# Patient Record
Sex: Female | Born: 1955
Health system: Southern US, Community
[De-identification: ages and names within clinical notes are randomized; demographics above are authoritative.]

## PROBLEM LIST (undated history)

## (undated) DIAGNOSIS — D329 Benign neoplasm of meninges, unspecified: Secondary | ICD-10-CM

## (undated) DIAGNOSIS — K219 Gastro-esophageal reflux disease without esophagitis: Secondary | ICD-10-CM

## (undated) DIAGNOSIS — R0600 Dyspnea, unspecified: Secondary | ICD-10-CM

## (undated) DIAGNOSIS — H269 Unspecified cataract: Secondary | ICD-10-CM

## (undated) DIAGNOSIS — I499 Cardiac arrhythmia, unspecified: Secondary | ICD-10-CM

## (undated) DIAGNOSIS — Z87442 Personal history of urinary calculi: Secondary | ICD-10-CM

## (undated) DIAGNOSIS — T7840XA Allergy, unspecified, initial encounter: Secondary | ICD-10-CM

## (undated) DIAGNOSIS — K449 Diaphragmatic hernia without obstruction or gangrene: Secondary | ICD-10-CM

## (undated) DIAGNOSIS — E236 Other disorders of pituitary gland: Secondary | ICD-10-CM

## (undated) DIAGNOSIS — M2550 Pain in unspecified joint: Secondary | ICD-10-CM

## (undated) DIAGNOSIS — I10 Essential (primary) hypertension: Secondary | ICD-10-CM

## (undated) DIAGNOSIS — M1990 Unspecified osteoarthritis, unspecified site: Secondary | ICD-10-CM

## (undated) DIAGNOSIS — C801 Malignant (primary) neoplasm, unspecified: Secondary | ICD-10-CM

## (undated) DIAGNOSIS — Z1401 Asymptomatic hemophilia A carrier: Secondary | ICD-10-CM

## (undated) DIAGNOSIS — M5481 Occipital neuralgia: Secondary | ICD-10-CM

## (undated) DIAGNOSIS — J309 Allergic rhinitis, unspecified: Secondary | ICD-10-CM

## (undated) DIAGNOSIS — I471 Supraventricular tachycardia, unspecified: Secondary | ICD-10-CM

## (undated) DIAGNOSIS — R0609 Other forms of dyspnea: Secondary | ICD-10-CM

## (undated) DIAGNOSIS — G709 Myoneural disorder, unspecified: Secondary | ICD-10-CM

## (undated) DIAGNOSIS — F419 Anxiety disorder, unspecified: Secondary | ICD-10-CM

## (undated) DIAGNOSIS — I890 Lymphedema, not elsewhere classified: Secondary | ICD-10-CM

## (undated) DIAGNOSIS — J45909 Unspecified asthma, uncomplicated: Secondary | ICD-10-CM

## (undated) DIAGNOSIS — R7303 Prediabetes: Secondary | ICD-10-CM

## (undated) DIAGNOSIS — I209 Angina pectoris, unspecified: Secondary | ICD-10-CM

## (undated) DIAGNOSIS — R079 Chest pain, unspecified: Secondary | ICD-10-CM

## (undated) DIAGNOSIS — M549 Dorsalgia, unspecified: Secondary | ICD-10-CM

## (undated) DIAGNOSIS — C4490 Unspecified malignant neoplasm of skin, unspecified: Secondary | ICD-10-CM

## (undated) DIAGNOSIS — D649 Anemia, unspecified: Secondary | ICD-10-CM

## (undated) DIAGNOSIS — H33311 Horseshoe tear of retina without detachment, right eye: Secondary | ICD-10-CM

## (undated) DIAGNOSIS — K439 Ventral hernia without obstruction or gangrene: Secondary | ICD-10-CM

## (undated) DIAGNOSIS — R0602 Shortness of breath: Secondary | ICD-10-CM

## (undated) DIAGNOSIS — R002 Palpitations: Secondary | ICD-10-CM

## (undated) DIAGNOSIS — R519 Headache, unspecified: Secondary | ICD-10-CM

## (undated) DIAGNOSIS — S8990XA Unspecified injury of unspecified lower leg, initial encounter: Secondary | ICD-10-CM

## (undated) HISTORY — DX: Chest pain, unspecified: R07.9

## (undated) HISTORY — DX: Horseshoe tear of retina without detachment, right eye: H33.311

## (undated) HISTORY — PX: HERNIA REPAIR: SHX51

## (undated) HISTORY — PX: NECK SURGERY: SHX720

## (undated) HISTORY — PX: ABDOMINAL HYSTERECTOMY: SHX81

## (undated) HISTORY — PX: THYROIDECTOMY, PARTIAL: SHX18

## (undated) HISTORY — DX: Unspecified malignant neoplasm of skin, unspecified: C44.90

## (undated) HISTORY — DX: Unspecified asthma, uncomplicated: J45.909

## (undated) HISTORY — DX: Dorsalgia, unspecified: M54.9

## (undated) HISTORY — DX: Supraventricular tachycardia, unspecified: I47.10

## (undated) HISTORY — DX: Unspecified cataract: H26.9

## (undated) HISTORY — DX: Dyspnea, unspecified: R06.00

## (undated) HISTORY — DX: Myoneural disorder, unspecified: G70.9

## (undated) HISTORY — DX: Palpitations: R00.2

## (undated) HISTORY — DX: Shortness of breath: R06.02

## (undated) HISTORY — DX: Other disorders of pituitary gland: E23.6

## (undated) HISTORY — DX: Allergy, unspecified, initial encounter: T78.40XA

## (undated) HISTORY — DX: Other forms of dyspnea: R06.09

## (undated) HISTORY — DX: Allergic rhinitis, unspecified: J30.9

## (undated) HISTORY — DX: Supraventricular tachycardia: I47.1

## (undated) HISTORY — DX: Unspecified osteoarthritis, unspecified site: M19.90

## (undated) HISTORY — DX: Essential (primary) hypertension: I10

## (undated) HISTORY — DX: Diaphragmatic hernia without obstruction or gangrene: K44.9

## (undated) HISTORY — PX: SKIN CANCER EXCISION: SHX779

## (undated) HISTORY — PX: BIOPSY THYROID: PRO38

## (undated) HISTORY — DX: Morbid (severe) obesity due to excess calories: E66.01

## (undated) HISTORY — PX: TUBAL LIGATION: SHX77

## (undated) HISTORY — PX: FOOT SURGERY: SHX648

## (undated) HISTORY — DX: Benign neoplasm of meninges, unspecified: D32.9

## (undated) HISTORY — PX: TONSILLECTOMY: SUR1361

---
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
## 1992-06-11 HISTORY — PX: BREAST CYST EXCISION: SHX579

## 1993-06-11 HISTORY — PX: CHOLECYSTECTOMY: SHX55

## 1994-06-11 HISTORY — PX: APPENDECTOMY: SHX54

## 1998-06-11 HISTORY — PX: VENTRAL HERNIA REPAIR: SHX424

## 2006-04-04 ENCOUNTER — Ambulatory Visit: Payer: Self-pay | Admitting: Family Medicine

## 2006-04-26 ENCOUNTER — Ambulatory Visit: Payer: Self-pay | Admitting: Family Medicine

## 2006-06-11 ENCOUNTER — Encounter: Payer: Self-pay | Admitting: Family Medicine

## 2006-06-11 LAB — CONVERTED CEMR LAB: Pap Smear: NORMAL

## 2006-06-12 ENCOUNTER — Other Ambulatory Visit: Admission: RE | Admit: 2006-06-12 | Discharge: 2006-06-12 | Payer: Self-pay | Admitting: Family Medicine

## 2006-06-12 ENCOUNTER — Ambulatory Visit: Payer: Self-pay | Admitting: Family Medicine

## 2006-06-12 ENCOUNTER — Encounter: Payer: Self-pay | Admitting: Family Medicine

## 2006-06-19 ENCOUNTER — Encounter: Admission: RE | Admit: 2006-06-19 | Discharge: 2006-06-19 | Payer: Self-pay | Admitting: Family Medicine

## 2006-12-31 ENCOUNTER — Telehealth (INDEPENDENT_AMBULATORY_CARE_PROVIDER_SITE_OTHER): Payer: Self-pay | Admitting: *Deleted

## 2007-03-31 ENCOUNTER — Ambulatory Visit: Payer: Self-pay | Admitting: Internal Medicine

## 2007-03-31 DIAGNOSIS — L259 Unspecified contact dermatitis, unspecified cause: Secondary | ICD-10-CM | POA: Insufficient documentation

## 2007-04-03 ENCOUNTER — Encounter: Payer: Self-pay | Admitting: Family Medicine

## 2007-04-03 DIAGNOSIS — J309 Allergic rhinitis, unspecified: Secondary | ICD-10-CM | POA: Insufficient documentation

## 2007-04-03 DIAGNOSIS — J45909 Unspecified asthma, uncomplicated: Secondary | ICD-10-CM

## 2007-04-03 DIAGNOSIS — D259 Leiomyoma of uterus, unspecified: Secondary | ICD-10-CM | POA: Insufficient documentation

## 2007-04-03 DIAGNOSIS — J4520 Mild intermittent asthma, uncomplicated: Secondary | ICD-10-CM | POA: Insufficient documentation

## 2007-04-03 HISTORY — DX: Unspecified asthma, uncomplicated: J45.909

## 2007-04-03 HISTORY — DX: Allergic rhinitis, unspecified: J30.9

## 2007-06-10 ENCOUNTER — Ambulatory Visit: Payer: Self-pay | Admitting: Family Medicine

## 2007-06-10 DIAGNOSIS — J45901 Unspecified asthma with (acute) exacerbation: Secondary | ICD-10-CM | POA: Insufficient documentation

## 2007-07-07 ENCOUNTER — Emergency Department: Payer: Self-pay | Admitting: Emergency Medicine

## 2007-07-07 ENCOUNTER — Other Ambulatory Visit: Payer: Self-pay

## 2007-07-07 ENCOUNTER — Telehealth: Payer: Self-pay | Admitting: Family Medicine

## 2007-07-30 ENCOUNTER — Encounter: Admission: RE | Admit: 2007-07-30 | Discharge: 2007-07-30 | Payer: Self-pay | Admitting: Family Medicine

## 2007-08-08 ENCOUNTER — Encounter: Payer: Self-pay | Admitting: Family Medicine

## 2007-08-08 ENCOUNTER — Encounter: Admission: RE | Admit: 2007-08-08 | Discharge: 2007-08-08 | Payer: Self-pay | Admitting: Family Medicine

## 2007-09-14 ENCOUNTER — Encounter: Admission: RE | Admit: 2007-09-14 | Discharge: 2007-09-14 | Payer: Self-pay | Admitting: Orthopedic Surgery

## 2007-11-25 ENCOUNTER — Ambulatory Visit: Payer: Self-pay | Admitting: Internal Medicine

## 2007-11-25 ENCOUNTER — Ambulatory Visit: Payer: Self-pay | Admitting: Cardiology

## 2007-11-25 ENCOUNTER — Encounter: Payer: Self-pay | Admitting: Internal Medicine

## 2007-11-25 ENCOUNTER — Telehealth: Payer: Self-pay | Admitting: Family Medicine

## 2007-11-25 ENCOUNTER — Inpatient Hospital Stay (HOSPITAL_COMMUNITY): Admission: EM | Admit: 2007-11-25 | Discharge: 2007-11-27 | Payer: Self-pay | Admitting: Emergency Medicine

## 2007-11-26 ENCOUNTER — Encounter: Payer: Self-pay | Admitting: Internal Medicine

## 2007-11-27 ENCOUNTER — Encounter: Payer: Self-pay | Admitting: Internal Medicine

## 2007-11-29 ENCOUNTER — Telehealth: Payer: Self-pay | Admitting: Internal Medicine

## 2007-12-01 ENCOUNTER — Telehealth: Payer: Self-pay | Admitting: Internal Medicine

## 2007-12-02 ENCOUNTER — Ambulatory Visit: Payer: Self-pay | Admitting: Internal Medicine

## 2007-12-03 ENCOUNTER — Inpatient Hospital Stay (HOSPITAL_COMMUNITY): Admission: EM | Admit: 2007-12-03 | Discharge: 2007-12-04 | Payer: Self-pay | Admitting: Emergency Medicine

## 2007-12-03 ENCOUNTER — Ambulatory Visit: Payer: Self-pay | Admitting: Internal Medicine

## 2007-12-03 ENCOUNTER — Encounter: Payer: Self-pay | Admitting: Internal Medicine

## 2007-12-10 ENCOUNTER — Ambulatory Visit: Payer: Self-pay | Admitting: Family Medicine

## 2007-12-10 DIAGNOSIS — I498 Other specified cardiac arrhythmias: Secondary | ICD-10-CM | POA: Insufficient documentation

## 2007-12-17 ENCOUNTER — Ambulatory Visit: Payer: Self-pay | Admitting: Internal Medicine

## 2008-01-26 ENCOUNTER — Ambulatory Visit: Payer: Self-pay | Admitting: Internal Medicine

## 2008-01-27 ENCOUNTER — Ambulatory Visit: Payer: Self-pay | Admitting: Internal Medicine

## 2008-03-08 ENCOUNTER — Telehealth: Payer: Self-pay | Admitting: Internal Medicine

## 2008-08-06 ENCOUNTER — Encounter: Admission: RE | Admit: 2008-08-06 | Discharge: 2008-08-06 | Payer: Self-pay | Admitting: Family Medicine

## 2008-08-10 ENCOUNTER — Encounter: Payer: Self-pay | Admitting: Family Medicine

## 2008-10-04 ENCOUNTER — Telehealth: Payer: Self-pay | Admitting: Family Medicine

## 2008-11-17 ENCOUNTER — Encounter (INDEPENDENT_AMBULATORY_CARE_PROVIDER_SITE_OTHER): Payer: Self-pay | Admitting: *Deleted

## 2009-01-06 ENCOUNTER — Telehealth (INDEPENDENT_AMBULATORY_CARE_PROVIDER_SITE_OTHER): Payer: Self-pay | Admitting: Internal Medicine

## 2009-01-10 ENCOUNTER — Ambulatory Visit: Payer: Self-pay | Admitting: Family Medicine

## 2009-01-10 DIAGNOSIS — J029 Acute pharyngitis, unspecified: Secondary | ICD-10-CM | POA: Insufficient documentation

## 2009-01-10 LAB — CONVERTED CEMR LAB: Rapid Strep: NEGATIVE

## 2009-01-11 ENCOUNTER — Ambulatory Visit: Payer: Self-pay | Admitting: Internal Medicine

## 2009-03-16 ENCOUNTER — Telehealth: Payer: Self-pay | Admitting: Family Medicine

## 2009-04-08 ENCOUNTER — Ambulatory Visit: Payer: Self-pay | Admitting: Family Medicine

## 2009-04-08 ENCOUNTER — Encounter: Payer: Self-pay | Admitting: Family Medicine

## 2009-04-08 ENCOUNTER — Other Ambulatory Visit: Admission: RE | Admit: 2009-04-08 | Discharge: 2009-04-08 | Payer: Self-pay | Admitting: Family Medicine

## 2009-04-14 ENCOUNTER — Encounter (INDEPENDENT_AMBULATORY_CARE_PROVIDER_SITE_OTHER): Payer: Self-pay | Admitting: *Deleted

## 2009-04-15 ENCOUNTER — Encounter (INDEPENDENT_AMBULATORY_CARE_PROVIDER_SITE_OTHER): Payer: Self-pay | Admitting: *Deleted

## 2009-04-15 ENCOUNTER — Ambulatory Visit: Payer: Self-pay | Admitting: Family Medicine

## 2009-04-15 LAB — CONVERTED CEMR LAB: Fecal Occult Bld: NEGATIVE

## 2009-04-21 ENCOUNTER — Ambulatory Visit: Payer: Self-pay | Admitting: Family Medicine

## 2009-04-21 LAB — CONVERTED CEMR LAB
ALT: 19 units/L (ref 0–35)
AST: 14 units/L (ref 0–37)
Albumin: 3.9 g/dL (ref 3.5–5.2)
Alkaline Phosphatase: 72 units/L (ref 39–117)
BUN: 10 mg/dL (ref 6–23)
Bilirubin, Direct: 0 mg/dL (ref 0.0–0.3)
CO2: 31 meq/L (ref 19–32)
Calcium: 9.2 mg/dL (ref 8.4–10.5)
Chloride: 105 meq/L (ref 96–112)
Cholesterol: 187 mg/dL (ref 0–200)
Creatinine, Ser: 0.5 mg/dL (ref 0.4–1.2)
GFR calc non Af Amer: 136.92 mL/min (ref >60)
Glucose, Bld: 94 mg/dL (ref 70–99)
HDL: 33.9 mg/dL — ABNORMAL LOW (ref >39.00)
LDL Cholesterol: 132 mg/dL — ABNORMAL HIGH (ref 0–99)
Potassium: 4.3 meq/L (ref 3.5–5.1)
Sodium: 143 meq/L (ref 135–145)
Total Bilirubin: 0.9 mg/dL (ref 0.3–1.2)
Total CHOL/HDL Ratio: 6
Total Protein: 7 g/dL (ref 6.0–8.3)
Triglycerides: 106 mg/dL (ref 0.0–149.0)
VLDL: 21.2 mg/dL (ref 0.0–40.0)

## 2009-08-04 ENCOUNTER — Telehealth: Payer: Self-pay | Admitting: Family Medicine

## 2009-08-08 ENCOUNTER — Telehealth: Payer: Self-pay | Admitting: Family Medicine

## 2009-08-08 ENCOUNTER — Encounter: Admission: RE | Admit: 2009-08-08 | Discharge: 2009-08-08 | Payer: Self-pay | Admitting: Family Medicine

## 2009-08-08 ENCOUNTER — Ambulatory Visit: Payer: Self-pay | Admitting: Family Medicine

## 2009-10-11 ENCOUNTER — Telehealth: Payer: Self-pay | Admitting: Family Medicine

## 2009-10-11 DIAGNOSIS — K469 Unspecified abdominal hernia without obstruction or gangrene: Secondary | ICD-10-CM | POA: Insufficient documentation

## 2009-10-18 ENCOUNTER — Telehealth: Payer: Self-pay | Admitting: Family Medicine

## 2009-10-19 ENCOUNTER — Ambulatory Visit: Payer: Self-pay | Admitting: Family Medicine

## 2009-10-19 DIAGNOSIS — L255 Unspecified contact dermatitis due to plants, except food: Secondary | ICD-10-CM | POA: Insufficient documentation

## 2009-10-20 ENCOUNTER — Encounter: Payer: Self-pay | Admitting: Family Medicine

## 2009-10-21 ENCOUNTER — Telehealth: Payer: Self-pay | Admitting: Family Medicine

## 2009-11-21 ENCOUNTER — Encounter: Payer: Self-pay | Admitting: Family Medicine

## 2009-12-06 ENCOUNTER — Encounter: Admission: RE | Admit: 2009-12-06 | Discharge: 2009-12-06 | Payer: Self-pay | Admitting: Family Medicine

## 2010-04-07 ENCOUNTER — Ambulatory Visit: Payer: Self-pay | Admitting: Internal Medicine

## 2010-04-07 ENCOUNTER — Encounter: Payer: Self-pay | Admitting: Family Medicine

## 2010-04-07 DIAGNOSIS — R233 Spontaneous ecchymoses: Secondary | ICD-10-CM | POA: Insufficient documentation

## 2010-04-11 LAB — CONVERTED CEMR LAB
Basophils Absolute: 0 10*3/uL (ref 0.0–0.1)
Basophils Relative: 0 % (ref 0–1)
Eosinophils Absolute: 0.1 10*3/uL (ref 0.0–0.7)
Eosinophils Relative: 1 % (ref 0–5)
HCT: 39.5 % (ref 36.0–46.0)
Hemoglobin: 12.7 g/dL (ref 12.0–15.0)
Lymphocytes Relative: 21 % (ref 12–46)
Lymphs Abs: 2.4 10*3/uL (ref 0.7–4.0)
MCHC: 32.2 g/dL (ref 30.0–36.0)
MCV: 88 fL (ref 78.0–100.0)
Monocytes Absolute: 0.8 10*3/uL (ref 0.1–1.0)
Monocytes Relative: 7 % (ref 3–12)
Neutro Abs: 8.2 10*3/uL — ABNORMAL HIGH (ref 1.7–7.7)
Neutrophils Relative %: 71 % (ref 43–77)
Platelets: 302 10*3/uL (ref 150–400)
RBC: 4.49 M/uL (ref 3.87–5.11)
RDW: 13.8 % (ref 11.5–15.5)
TSH: 0.632 microintl units/mL (ref 0.350–4.500)
WBC: 11.5 10*3/uL — ABNORMAL HIGH (ref 4.0–10.5)

## 2010-05-06 ENCOUNTER — Emergency Department (HOSPITAL_COMMUNITY)
Admission: EM | Admit: 2010-05-06 | Discharge: 2010-05-06 | Payer: Self-pay | Source: Home / Self Care | Admitting: Emergency Medicine

## 2010-05-06 ENCOUNTER — Emergency Department (HOSPITAL_COMMUNITY)
Admission: EM | Admit: 2010-05-06 | Discharge: 2010-05-06 | Disposition: A | Discharge status: Other Acute Inpatient Hospital | Payer: Self-pay | Source: Home / Self Care | Admitting: Emergency Medicine

## 2010-07-02 ENCOUNTER — Encounter: Payer: Self-pay | Admitting: Family Medicine

## 2010-07-11 NOTE — Assessment & Plan Note (Signed)
Summary: Hospital H & P    History of Present Illness: 55 y/o white female with PMHx of morbid obesity, htn, and asthma presents with acute dyspnea and chest pain.  Pt reports root canal 2 weeks ago.  Pt seen by her dentist for persistent pain of right jaw.  Pt started on Amox two times a day, vicodin, and medrol dose pack with little improvement.    Pt's symptoms of SOB started last night.  She has tough time walking due to SOB.  She slept in her recliner last night.  Pt states "feels like someone is choking me".  She had associated neck pain/tightness and chest tightness.  Selfcare measures include mucinex overnight.  She also had mild wheezing.  Pain in left upper chest radiates to neck and back.  She denies lower extremity redness or edema.  Her SOB worsens when she lays on her back.   Past Medical History:    Reviewed history from 04/03/2007 and no changes required:       Asthma       Allergic rhinitis       Hypertension       Morbid Obesity   Family History:    Reviewed history from 04/03/2007 and no changes required:       Father: Died 40's DM, HTN       Mother: Alive 34 HTN, DM, CVA       Siblings: 4 brothers hemophilia, DM, HTN       3 sisters hemophilia, DM, HTN       No MI < 55       HBP: (+)       DM:  (+)       Breast CA, Mom  Social History:    Reviewed history from 04/03/2007 and no changes required:       Never Smoked       Alcohol use-no       Drug use-no       Regular exercise-yes, walks at school       Marital Status: Married x 21 years       Children: 1 son in college, 67 22 years old with hemophilia       Occupation: Runner, broadcasting/film/video, Music therapist, Language Arts       Diet:  3 meals a day, large dinner, (+) fruits and veggies, (+) H2O          Physical Exam  General:     Obese 55 y/o female NAD  Head:     normocephalic and atraumatic.   Eyes:     vision grossly intact, pupils equal, pupils round, and pupils reactive to light.  no conjunctival petechiae Ears:     R ear normal and L ear normal.   Nose:     no external deformity and no nasal discharge.   Mouth:     right jaw tenderness.  no obvious lesions or redness Neck:     supple and no masses.  bilateral mastoid tenderness Lungs:     normal respiratory effort, normal breath sounds, and no wheezes.   Heart:     normal rate, regular rhythm, and no gallop.  No muffled heart sounds. Abdomen:     obese, mild epigastric tenderness (chronic), unable to apprec organomegaly Extremities:     trace left pedal edema and trace right pedal edema.   Neurologic:     alert & oriented X3 and cranial nerves II-XII intact.   Skin:     turgor  normal, color normal, and no rashes.   Psych:     normally interactive, good eye contact, and slightly anxious.     Impression & Recommendations:  Problem # 1:  CHEST PAIN, ACUTE (ICD-786.50) Pt's chest pain has resolved with O2 and nitro in ER.   Rule out cardiac etiology.  Cycle cardiac enzymes.  Obtain 2D echo.  Consider pericardial effusion.  Unclear whether root canal/possible oral  abscess in causing pt's symptoms.  D Dimer is negative.  Obtain CT of Neck and CT of Chest with IV contrast.  Consider Lemierre syndrome.  I doubt dissection.  Problem # 2:  NECK PAIN (ICD-723.1) Pt with recent root canal.  WBC not significantly elevated.  Rule out deep neck infection.  Obtain CT of Neck with IV contrast.  Obtain blood cultures.  Start empiric Zosyn after cultures obtained.  Problem # 3:  SHORTNESS OF BREATH (ICD-786.05) Unclear etiology.  Check ABG on room air.  CXR - negative for acute airspace disease.   Await CT of chest with IV contrast.  Problem # 4:  HYPERTENSION (ICD-401.9) BP stable.  Continue lisinopril.  Her updated medication list for this problem includes:    Lisinopril 40 Mg Tabs (Lisinopril) .Marland Kitchen... Take 1 tablet by mouth once a day   Complete Medication List: 1)  Lisinopril 40 Mg Tabs (Lisinopril) .... Take 1 tablet by mouth once a day 2)   Albuterol 90 Mcg/act Aers (Albuterol) .Marland Kitchen.. 1 or 2 puffs every 4-6 hours as needed 3)  Prednisone 20 Mg Tabs (Prednisone) .... 3 tabs by mouth daily x 3 days, then 2 tabs by mouth daily x 2 days then 1 tab by mouth daily x 2 days 4)  Azithromycin 250 Mg Tabs (Azithromycin) .... 2 tab by mouth x1 day, then 1 tab by mouth daily   Vital Signs:  Patient Profile:   55 Years Old Female O2 Sat:      99 % Temp:     97.4 degrees F Pulse rate:   75 / minute Resp:     20 per minute BP sitting:   142 / 79                D-Dimer, Fibrin Derivatives              0.29              0.00-0.48        ug/mL-FEU  Protime ( Prothrombin Time)              13.1              11.6-15.2        seconds  INR                                      1.0               0.0-1.5 Sodium (NA)                              141               135-145          mEq/L  Potassium (K)  3.4        l      3.5-5.1          mEq/L  Chloride                                 103               96-112           mEq/L  CO2                                      30                19-32            mEq/L  Glucose                                  100        h      70-99            mg/dL  BUN                                      17                6-23             mg/dL  Creatinine                               0.68              0.4-1.2          mg/dL  WBC                                      11.9       h      4.0-10.5         K/uL  RBC                                      3.98              3.87-5.11        MIL/uL  Hemoglobin (HGB)                         11.4       l      12.0-15.0        g/dL  Hematocrit (HCT)                         32.8       l      36.0-46.0        %  MCV  82.6              78.0-100.0       fL  MCHC                                     34.6              30.0-36.0        g/dL  RDW                                      14.5              11.5-15.5        %  Platelet  Count (PLT)                     265               150-400          K/uL   CKMB, POC                                <1.0       l      1.0-8.0          ng/mL  Troponin I, POC                          <0.05             0.00-0.05        ng/mL  Myoglobin, POC                           33.2              12-200           ng/mL  CXR:  Borderline cardiomegaly - no active disease.  EKG:  NSR @ 74bpm.  Incomplete RBBB

## 2010-07-11 NOTE — Progress Notes (Signed)
Summary: SOB, tightness in neck and chest  Phone Note Call from Patient Call back at [phone]   Caller: Patient Call For: Dr. Ermalene Searing Summary of Call: Pt is having a problem with labored breathing that started yesterday.  Pt had a root canal 2 weeks ago and the dentist gave her a Medrol dose pak and she has 2 more days left of this.  Pt has the labored breathing any time that she is up moving around, some wheezing last night while trying to sleep, she is having alot tightness in her neck and tightness in her chest when she walks.  Pt states that the tightness in her neck has gotten worse. Initial call taken by: Sydell Axon,  November 25, 2007 2:39 PM  Follow-up for Phone Call        After speaking with Dr. Hetty Ely, pt was advised to go to the ER to be evaluated.  Pt stated that she is close to [facility] and will go there. Follow-up by: Sydell Axon,  November 25, 2007 2:44 PM  Additional Follow-up for Phone Call Additional follow up Details #1::        agree. Additional Follow-up by: Shaune Leeks MD,  November 25, 2007 3:07 PM

## 2010-07-11 NOTE — Progress Notes (Signed)
Summary: indigestion (bedsole pt)   Phone Note Call from Patient Call back at cell [phone]   Caller: Patient Call For: bedsole Summary of Call: c/o indigestion like cp. what to take for indigeston Initial call taken by: Liane Comber,  December 31, 2006 9:26 AM  Follow-up for Phone Call        if severe cp, sob or sweating or any other sympt go to er for eval for heartburn can try tums or rolaids for quick relief, along with otc zantac or pepcid as directed if no imp, f/u Follow-up by: Judith Part MD,  December 31, 2006 11:18 AM  Additional Follow-up for Phone Call Additional follow up Details #1::        PATIENT ADVISED ..................................................................Marland KitchenLiane Comber  December 31, 2006 11:41 AM

## 2010-07-11 NOTE — Consult Note (Signed)
Summary: [facility]/Consultation Report/Dr. [facility]/Consultation Report/Dr. Ophelia Charter   Imported By: Mickle Asper 08/14/2007 12:31:34  _____________________________________________________________________  External Attachment:    Type:   Image     Comment:   External Document

## 2010-07-11 NOTE — Progress Notes (Signed)
Summary: needs sooner apt  Phone Note Call from Patient Call back at Home Phone [phone]   Caller: Patient Reason for Call: Talk to Nurse, Lab or Test Results Summary of Call: patient was seen in the hosp. the doctor at the hosp (PT Worthy Flank' know dr name) told her she needed to see a pulmonary doc this week. we did not have anything sooner than 7/2. she wanted to talk to a nurse  Initial call taken by: Valinda Hoar,  December 01, 2007 9:17 AM  Follow-up for Phone Call        lmom for pt to call back with a name of who told her to see someone this week or have her primary call a make a doctor to doctor refferal Follow-up by: Philipp Deputy CMA,  December 01, 2007 1:12 PM  Additional Follow-up for Phone Call Additional follow up Details #1::        Patient phoned our office [place] to try to get doctor to doctor referral.  Dr. Ermalene Searing is on vacation this week.  This message along with a copy of the hospital discharge summary was submitted to [facility] to see if we need to give it to one of the other providers. Additional Follow-up by: Delilah Shan,  December 01, 2007 4:31 PM    Additional Follow-up for Phone Call Additional follow up Details #2::    Patient is still having the spasms and having trouble breathing and is still struggling with this problem.  Can you see her tomorrow or call for a doctor to doctor referral? Follow-up by: Delilah Shan,  December 01, 2007 4:46 PM  Additional Follow-up for Phone Call Additional follow up Details #3:: Details for Additional Follow-up Action Taken: okay to set her up tomorrow morning Cindee Salt MD  December 01, 2007 5:48 PM   Pt notified as instructed.  Appt scheduled with Dr. Alphonsus Sias tomorrow at 12:15 Additional Follow-up by: Sydell Axon,  December 01, 2007 5:53 PM

## 2010-07-11 NOTE — Assessment & Plan Note (Signed)
Summary: PER CHECK OUT/SF    Referring Provider:  Dr. Ermalene Searing Primary Provider:  Dr. Ermalene Searing   History of Present Illness: Ms. Umana returns today for followup of her SVT.  I saw her initially just over a year ago.  At that time she had been hospitalized with SVT on no meds.  She has been on Calcium channel blockers over the past year and done well with minimal symptoms of SVT.  She appears to be tolerating her meds and also takes Cardizem for HTN.  She is frustrated by her inability to lose weight.  Current Medications (verified): 1)  Cartia Xt 240 Mg  Cp24 (Diltiazem Hcl Coated Beads) .... Take 1 Tablet By Mouth Once A Day 2)  Qvar 40 Mcg/act  Aers (Beclomethasone Dipropionate) .... Two Puffs in The Morning and At Night As Needed 3)  Claritin 10 Mg  Tabs (Loratadine) .Marland Kitchen.. 1 Tablet By Mouth Once Daily As Needed 4)  Mucinex Dm 30-600 Mg  Tb12 (Dextromethorphan-Guaifenesin) .... As Needed 5)  Xopenex Hfa 45 Mcg/act  Aero (Levalbuterol Tartrate) .... 2 Every 4 Hours As Needed 6)  Amoxicillin 500 Mg Caps (Amoxicillin) .... 2 Tab By Mouth Two Times A Day X 10 Days  Allergies: No Known Drug Allergies  Past History:  Past Medical History: Last updated: 01/27/2008 Asthma   - PFT's January 27, 2008 FEV 1 68 ->82%  after Beta2 Allergic rhinitis Hypertension Morbid Obesity  Past Surgical History: Last updated: 04/03/2007 1995        Gallbladder removal 1996         Breast biopsy (-) x 2 1970         Appendectomy 1970         Ovarian cyst removal 2000         Hernia surgery - ventral 1995, 1996 2 foot surgeries 1975         Pilonidal cyst                  Skin CA removal  Vital Signs:  Patient profile:   55 year old female Height:      67 inches Weight:      336 pounds BMI:     52.82 Pulse rate:   87 / minute Resp:     18 per minute BP sitting:   151 / 90  (right arm)  Vitals Entered By: Marrion Coy, CNA (January 11, 2009 3:15 PM)  Physical Exam  General:  Morbidly  obese, NAD. Head:  normocephalic and atraumatic Eyes:  PERRLA/EOM intact; conjunctiva and lids normal. Mouth:  Teeth, gums and palate normal. Oral mucosa normal. Neck:  Neck supple, no JVD. No masses, thyromegaly or abnormal cervical nodes. Lungs:  Clear bilaterally to auscultation and percussion. Heart:  RRR with normal S1 and S2.  PMI is not enlarged or laterally displaced. Abdomen:  Obese, NTND, or rebound or guarding.  Questionable abdominal hernia. Msk:  Back normal, normal gait. Muscle strength and tone normal. Pulses:  pulses normal in all 4 extremities Extremities:  No clubbing or cyanosis. Neurologic:  Alert and oriented x 3.   EKG  Procedure date:  01/11/2009  Findings:      Normal sinus rhythm with rate of:  87.  Impression & Recommendations:  Problem # 1:  SUPRAVENTRICULAR TACHYCARDIA (ICD-427.89) Her symptoms are well controlled on her current meds. Her updated medication list for this problem includes:    Cartia Xt 240 Mg Cp24 (Diltiazem hcl coated beads) .Marland Kitchen... Take 1  tablet by mouth once a day  Problem # 2:  OBESITY, BMI >40 (ICD-278.00) I have encouraged increased physical activity and weight loss.  Problem # 3:  HYPERTENSION (ICD-401.9) Her pressures have been fairly well controlled.  Continue meds as noted below. Her updated medication list for this problem includes:    Cartia Xt 240 Mg Cp24 (Diltiazem hcl coated beads) .Marland Kitchen... Take 1 tablet by mouth once a day  Patient Instructions: 1)  Your physician recommends that you schedule a follow-up appointment in: 12 months with Dr Ladona Ridgel Prescriptions: CARTIA XT 240 MG  CP24 (DILTIAZEM HCL COATED BEADS) Take 1 tablet by mouth once a day  #30 x 11   Entered by:   Dennis Bast, RN, BSN   Authorized by:   Laren Boom, MD, [facility]   Signed by:   Dennis Bast, RN, BSN on 01/11/2009   Method used:   [facility] to        [facility]  [place] [facility]retail)       [address]       [place],  [place]  [address]       Ph: [phone] or [phone]       Fax: [phone]   RxID:   [id number]

## 2010-07-11 NOTE — Assessment & Plan Note (Signed)
Summary: Pulmonary/ final summary fu with PFT's   Referred by:  Dr. Ermalene Searing PCP:  Dr. 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  extensive workup at [facility] included on June 16 a CT scan of her neck and chest which were both unremarkable.  She says she was not improved at that time she left the hospital and had a workup by both cardiology and EP for her symptoms of dyspnea and palpitations with a final diagnosis of supraventricular tachycardia   Seen 12/17/07 in pulmonary clinic, lisinopril stopped, benicar 20 added, continue cardizem.   January 27, 2008 ov reports marked improvment with no further palpitations or dyspnea. Has not used anything for asthma, only needs rescue during colds and not this summer. Pt denies any significant sore throat, nasal congestion or excess secretions, fever, chills, sweats, unintended wt loss, pleuritic or exertional cp, orthopnea pnd or leg swelling.  Pt denies any increase in rescue therapy over baseline, denies waking up needing it or having early am exacerbations of coughing/wheezing/ or dyspnea       Prior Medications Reviewed Using: Patient Recall  Prior Medication List:  CARTIA XT 240 MG  CP24 (DILTIAZEM HCL COATED BEADS) Take 1 tablet by mouth once a day BENICAR 20 MG  TABS (OLMESARTAN MEDOXOMIL) One tablet by mouth daily ALBUTEROL 90 MCG/ACT  AERS (ALBUTEROL) 1 or 2 puffs every 4-6 hours as needed CLARITIN 10 MG  TABS (LORATADINE) 1 tablet by mouth once  daily MUCINEX DM 30-600 MG  TB12 (DEXTROMETHORPHAN-GUAIFENESIN) as needed   Updated Prior Medication List: CARTIA XT 240 MG  CP24 (DILTIAZEM HCL COATED BEADS) Take 1 tablet by mouth once a day BENICAR 20 MG  TABS (OLMESARTAN MEDOXOMIL) One tablet by mouth daily CLARITIN 10 MG  TABS (LORATADINE) 1 tablet by mouth once daily MUCINEX DM 30-600 MG  TB12 (DEXTROMETHORPHAN-GUAIFENESIN) as needed  Current Allergies (reviewed today): No known allergies   Past Medical History:    Asthma      - PFT's January 27, 2008 FEV 1 68 ->82%  after Beta2    Allergic rhinitis    Hypertension    Morbid Obesity   Family History:    Reviewed history from 04/03/2007 and no changes required:       Father: Died 11's DM, HTN       Mother: Alive 92 HTN, DM, CVA       Siblings: 4 brothers hemophilia, DM, HTN       3 sisters hemophilia, DM, HTN       No MI < 55       HBP: (+)       DM:  (+)       Breast CA, Mom  Social History:    Reviewed history from 04/03/2007 and no changes required:       Never Smoked       Alcohol use-no       Drug use-no  Regular exercise-yes, walks at school       Marital Status: Married x 21 years       Children: 1 son in college, 67 68 years old with hemophilia       Occupation: Runner, broadcasting/film/video, Music therapist, Language Arts       Diet:  3 meals a day, large dinner, (+) fruits and veggies, (+) H2O           Review of Systems  The patient denies anorexia, fever, weight loss, weight gain, vision loss, decreased hearing, hoarseness, chest pain, syncope, dyspnea on exertion, peripheral edema, prolonged cough, headaches, hemoptysis, abdominal pain, melena, hematochezia, severe indigestion/heartburn, hematuria, incontinence, muscle weakness, suspicious skin lesions, transient blindness, difficulty walking, depression, unusual weight change, abnormal bleeding, enlarged lymph nodes, and angioedema.     Vital Signs:  Patient Profile:   55 Years Old Female Height:     67 inches Weight:       353 pounds O2 Sat:      94 % O2 treatment:    Room Air Temp:     97.5 degrees F oral Pulse rate:   74 / minute BP sitting:   126 / 72  (left arm) Cuff size:   large  Vitals Entered By: Michel Bickers CMA (January 27, 2008 9:48 AM)                 Physical Exam   pleasant and ambulatory moderately obese white female in no acute distress. Afeb with normal vital signs, wt without change at 353 HEENT: nl dentition, turbinates, and orophanx. Nl external ear canals without cough reflex Neck without JVD/Nodes/TM Lungs clear to A and P bilaterally without cough on insp or exp maneuvers. Pseudowheeze has resolved. RRR no s3 or murmur or increase in P2 Abd soft and benign with nl excursion in the supine position. No bruits or organomegaly Ext warm without calf tenderness, cyanosis clubbing or edema Skin warm and dry without lesions       Pulmonary Function Test Date: 01/27/2008 Height (in.): 67 Gender: Female  Pre-Spirometry FVC    Value: 2.62 L/min   Pred: 3.61 L/min     % Pred: 73 % FEV1    Value: 1.87 L     Pred: 2.73 L     % Pred: 68 % FEV1/FVC  Value: 71 %     Pred: 75 %    FEF 25-75  Value: 1.22 L/min   Pred: 3.01 L/min     % Pred: 40 %  Post-Spirometry FVC    Value: 3.02 L/min   Pred: 3.61 L/min     % Pred: 84 % FEV1    Value: 2.25 L     Pred: 2.73 L     % Pred: 82 % FEV1/FVC  Value: 74 %     Pred: 75 %    FEF 25-75  Value: 1.81 L/min   Pred: 3.01 L/min     % Pred: 60 %  Lung Volumes TLC    Value: 4.67 L   % Pred: 84 % RV    Value: 1.95 L   % Pred: 97 % DLCO    Value: 23.2 %   % Pred: 68 % DLCO/VA  Value: 5.52 %   % Pred: 140 %    Impression & Recommendations:  Problem # 1:  SUPRAVENTRICULAR TACHYCARDIA (ICD-427.89) Tempting to consider the SVT was due to high wob from either asthma or psuedoasthma exac by use of albuterol.  Even if not, it  would make sense here to minimize exposure to Beta 1 effects but changing rescue inhalers to xopenex and attempting to  minimize need for it. Orders: Est. Patient Level IV (30865)   Problem # 2:  ASTHMA, PERSISTENT, MODERATE (ICD-493.90) PFt's show a classic pattern of chronic asthma with a silent but significant reversible component that she doesn't recognize and is therefore unlikely to adhere to a more rigorous maintenance regimen, though I attempted to instruct her as fully as possible as to the implications of poorly controlled longterm asthma, including remodeling and higher exac rates.  I spent extra time with the patient today explaining optimal mdi  technique.  This improved from  25 -50% The following medications were removed from the medication list:    Albuterol 90 Mcg/act Aers (Albuterol) .Marland Kitchen... 1 or 2 puffs every 4-6 hours as needed  Her updated medication list for this problem includes:    Qvar 40 Mcg/act Aers (Beclomethasone dipropionate) .Marland Kitchen..Marland Kitchen Two puffs in am and pm    Xopenex Hfa 45 Mcg/act Aero (Levalbuterol tartrate) .Marland Kitchen... 2 every 4 hours as needed  Orders: Est. Patient Level IV (78469)   Problem # 3:  HYPERTENSION (ICD-401.9) I would definitely avoid ace here - I suspect much of her instability and recent flare are directly related to the upper airway effects of ACE and note most of her symptomatic improvement occurred off the ace and before we documented she had definite occult asthma. Will defer final choice of rx to Dr  Ermalene Searing and pt (under formulary restrictions that may apply) Her updated medication list for this problem includes:    Cartia Xt 240 Mg Cp24 (Diltiazem hcl coated beads) .Marland Kitchen... Take 1 tablet by mouth once a day    Benicar 20 Mg Tabs (Olmesartan medoxomil) ..... One tablet by mouth daily  Orders: Est. Patient Level IV (62952)   Medications Added to Medication List This Visit: 1)  Qvar 40 Mcg/act Aers (Beclomethasone dipropionate) .... Two puffs in am and pm 2)  Xopenex Hfa 45 Mcg/act Aero (Levalbuterol tartrate) .... 2 every 4 hours as needed  Complete Medication  List: 1)  Cartia Xt 240 Mg Cp24 (Diltiazem hcl coated beads) .... Take 1 tablet by mouth once a day 2)  Benicar 20 Mg Tabs (Olmesartan medoxomil) .... One tablet by mouth daily 3)  Qvar 40 Mcg/act Aers (Beclomethasone dipropionate) .... Two puffs in am and pm 4)  Claritin 10 Mg Tabs (Loratadine) .Marland Kitchen.. 1 tablet by mouth once daily 5)  Mucinex Dm 30-600 Mg Tb12 (Dextromethorphan-guaifenesin) .... As needed 6)  Xopenex Hfa 45 Mcg/act Aero (Levalbuterol tartrate) .... 2 every 4 hours as needed   Patient Instructions: 1)  If your breathing worsens or you need to use your rescue inhaler (xopenex)  more than twice weekly or wake up more than twice a month with any respiratory symptoms or require more than two rescue inhalers per year, we need to see you right away. 2)   Please schedule a follow-up appointment as needed   Prescriptions: XOPENEX HFA 45 MCG/ACT  AERO (LEVALBUTEROL TARTRATE) 2 every 4 hours as needed  #1 x 1   Entered and Authorized by:   Nyoka Cowden MD   Signed by:   Nyoka Cowden MD on 01/27/2008   Method used:   Elect[facility] ...       [facility] [phone][facility]       [address]       [place], [place]  [address]  Ph: [phone]       Fax: [phone]   RxID:   [id number] QVAR 40 MCG/ACT  AERS (BECLOMETHASONE DIPROPIONATE) Two puffs in am and pm  #1 x 11   Entered and Authorized by:   Nyoka Cowden MD   Signed by:   Nyoka Cowden MD on 01/27/2008   Method used:   Electronically [facility] to ...       [facility]  [address] [phone]*       [address]       [place], [place]  [address]       Ph: [phone]       Fax: [phone]   RxID:   [id number]  ]

## 2010-07-11 NOTE — Progress Notes (Signed)
Summary: fell down stairs  Phone Note Call from Patient   Caller: Patient Call For: Kerby Nora MD Summary of Call: Patient fell down her stairs at her home. She says that she feels very bruised. Her side is causing her alot of pain, but is okay while she is sitting. 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  Initial call taken by: Melody Comas,  August 04, 2009 4:13 PM  Follow-up for Phone Call        If she fell down the stairs, with a lot of pain in her side, she should be evaluated.  cannot call in narcotics without eval  concern for rib fracture, other traumatic injury and should be seen tomorrow. Follow-up by: Hannah Beat MD,  August 04, 2009 4:50 PM  Additional Follow-up for Phone Call Additional follow up Details #1::        Patient advised and will call in for appt Additional Follow-up by: Benny Lennert CMA Duncan Dull),  August 04, 2009 4:54 PM

## 2010-07-11 NOTE — Progress Notes (Signed)
Summary: Possible side effect to pain medication  Phone Note Call from Patient Call back at Work Phone [phone]   Caller: Patient Call For: Kerby Nora MD Summary of Call: Patient says that she started taking her OXYCODONE-ACETAMINOPHEN 5-325 MG TABS this that she was prescribed this morning at 11:30. She said that now she is itching all over, like there are a million tiny bugs crawling all over her. She wants to know if she should stop taking it, or if this is a possible side effect.   Initial call taken by: Linde Gillis CMA Duncan Dull),  August 08, 2009 3:29 PM  Follow-up for Phone Call        narcotics can cause some itching symptoms  she should only be taking for pain as needed  let it get out of her system, then could retry a 1/2 a tablet dosing q 6 hours prn pain  to see if that improves symptoms Follow-up by: Hannah Beat MD,  August 08, 2009 4:19 PM  Additional Follow-up for Phone Call Additional follow up Details #1::        Patient advised as instructed. Additional Follow-up by: Linde Gillis CMA Duncan Dull),  August 08, 2009 4:57 PM

## 2010-07-11 NOTE — Letter (Signed)
Summary: [employer] certification/[facility]  [employer] certification/[employer] [facility] [employer] [facility]   Imported By: Lester [place]ille 11/26/2009 10:41:33  _____________________________________________________________________  External Attachment:    Type:   Image     Comment:   External Document

## 2010-07-11 NOTE — Assessment & Plan Note (Signed)
Summary: ST/CLE   Vital Signs:  Patient profile:   55 year old female Height:      67 inches Weight:      336.13 pounds BMI:     52.84 Temp:     98.4 degrees F oral Pulse rate:   84 / minute Pulse rhythm:   regular BP sitting:   130 / 100  (left arm) Cuff size:   large  Vitals Entered By: Delilah Shan CMA (January 10, 2009 9:42 AM) CC: ST   Acute Visit History:      The patient complains of cough, fever, and sore throat.  These symptoms began 6 days ago.  She denies earache, eye symptoms, nausea, and sinus problems.  Other comments include: getting worse not better son sick, improved with antibiotic using claritin.        Her highest temperature has been low grade, subjective.        The character of the cough is described as nonproductive, occassional.  There is no history of wheezing or shortness of breath associated with her cough.        She has had dysphagia associated with the sore throat.        Problems Prior to Update: 1)  Supraventricular Tachycardia  (ICD-427.89) 2)  Muscle Spasm  (ICD-728.85) 3)  Neck Pain  (ICD-723.1) 4)  Shortness of Breath  (ICD-786.05) 5)  Chest Pain, Acute  (ICD-786.50) 6)  Asthma, With Acute Exacerbation  (ICD-493.92) 7)  Obesity, Bmi >40  (ICD-278.00) 8)  Fibroids, Uterus  (ICD-218.9) 9)  Hypertension  (ICD-401.9) 10)  Allergic Rhinitis  (ICD-477.9) 11)  Dermatitis, Contact, Nos  (ICD-692.9) 12)  Asthma, Persistent, Moderate  (ICD-493.90)  Current Medications (verified): 1)  Cartia Xt 240 Mg  Cp24 (Diltiazem Hcl Coated Beads) .... Take 1 Tablet By Mouth Once A Day 2)  Qvar 40 Mcg/act  Aers (Beclomethasone Dipropionate) .... Two Puffs in The Morning and At Night As Needed 3)  Claritin 10 Mg  Tabs (Loratadine) .Marland Kitchen.. 1 Tablet By Mouth Once Daily As Needed 4)  Mucinex Dm 30-600 Mg  Tb12 (Dextromethorphan-Guaifenesin) .... As Needed 5)  Xopenex Hfa 45 Mcg/act  Aero (Levalbuterol Tartrate) .... 2 Every 4 Hours As Needed  Allergies  (verified): No Known Drug Allergies  Past History:  Past medical, surgical, family and social histories (including risk factors) reviewed, and no changes noted (except as noted below).  Past Medical History: Reviewed history from 01/27/2008 and no changes required. Asthma   - PFT's January 27, 2008 FEV 1 68 ->82%  after Beta2 Allergic rhinitis Hypertension Morbid Obesity  Past Surgical History: Reviewed history from 04/03/2007 and no changes required. 1995        Gallbladder removal 1996         Breast biopsy (-) x 2 1970         Appendectomy 1970         Ovarian cyst removal 2000         Hernia surgery - ventral 1995, 1996 2 foot surgeries 1975         Pilonidal cyst                  Skin CA removal  Family History: Reviewed history from 04/03/2007 and no changes required. Father: Died 35's DM, HTN Mother: Alive 67 HTN, DM, CVA Siblings: 4 brothers hemophilia, DM, HTN 3 sisters hemophilia, DM, HTN No MI < 55 HBP: (+) DM:  (+) Breast CA, Mom  Social History:  Reviewed history from 04/03/2007 and no changes required. Never Smoked Alcohol use-no Drug use-no Regular exercise-yes, walks at school Marital Status: Married x 21 years Children: 1 son in college, 45 65 years old with hemophilia Occupation: Runner, broadcasting/film/video, Music therapist, Language Arts Diet:  3 meals a day, large dinner, (+) fruits and veggies, (+) H2O  Review of Systems General:  Complains of fatigue. CV:  Denies chest pain or discomfort.  Physical Exam  General:  morbidly obese female in NAD Head:  no maxillary ttp Ears:  External ear exam shows no significant lesions or deformities.  Otoscopic examination reveals clear canals, tympanic membranes are intact bilaterally without bulging, retraction, inflammation or discharge. Hearing is grossly normal bilaterally. Nose:  External nasal examination shows no deformity or inflammation. Nasal mucosa are pink and moist without lesions or exudates. Mouth:  MMM, pharyngeal  erythema, tonsils absent, and pharyngeal exudate.   Neck:  anterior cerviacl nodes tender and swollen.  Lungs:  Normal respiratory effort, chest expands symmetrically. Lungs are clear to auscultation, no crackles or wheezes. Heart:  Normal rate and regular rhythm. S1 and S2 normal without gallop, murmur, click, rub or other extra sounds.   Impression & Recommendations:  Problem # 1:  PHARYNGITIS, PERSISTENT (ICD-462)  Given worsening and exudate in oropharynx, not imrpoving will cover for bacterial infection despite negative strep test. Call if not improving.   Orders: Rapid Strep ([address])  Her updated medication list for this problem includes:    Amoxicillin 500 Mg Caps (Amoxicillin) .Marland Kitchen... 2 tab by mouth two times a day x 10 days  Complete Medication List: 1)  Cartia Xt 240 Mg Cp24 (Diltiazem hcl coated beads) .... Take 1 tablet by mouth once a day 2)  Qvar 40 Mcg/act Aers (Beclomethasone dipropionate) .... Two puffs in the morning and at night as needed 3)  Claritin 10 Mg Tabs (Loratadine) .Marland Kitchen.. 1 tablet by mouth once daily as needed 4)  Mucinex Dm 30-600 Mg Tb12 (Dextromethorphan-guaifenesin) .... As needed 5)  Xopenex Hfa 45 Mcg/act Aero (Levalbuterol tartrate) .... 2 every 4 hours as needed 6)  Amoxicillin 500 Mg Caps (Amoxicillin) .... 2 tab by mouth two times a day x 10 days Prescriptions: AMOXICILLIN 500 MG CAPS (AMOXICILLIN) 2 tab by mouth two times a day x 10 days  #40 x 0   Entered and Authorized by:   Kerby Nora MD   Signed by:   Kerby Nora MD on 01/10/2009   Method used:   [facility]  [place]       [address]       [place], [place]  [address]       Ph: [phone] or [phone]       Fax: [phone]   RxID:   [id number]   Current Allergies (reviewed today): No known allergies      Laboratory Results    Other Tests  Rapid Strep: negative

## 2010-07-11 NOTE — Letter (Signed)
Summary: [facility]   Imported By: Lanelle Bal 11/03/2009 09:04:03  _____________________________________________________________________  External Attachment:    Type:   Image     Comment:   External Document

## 2010-07-11 NOTE — Progress Notes (Signed)
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  There are no CPX appts available those days.  Is it ok to put 2 appts together? Initial call taken by: Lowella Petties CMA,  March 16, 2009 4:13 PM  Follow-up for Phone Call        yes..no need to ask..can put any of these appts together if CPX not available.  Follow-up by: Kimberly Nora MD,  March 16, 2009 11:12 PM  Additional Follow-up for Phone Call Additional follow up Details #1::        appt already made Additional Follow-up by: Benny Lennert CMA (AAMA),  March 17, 2009 10:28 AM

## 2010-07-11 NOTE — Letter (Signed)
Summary: Appointment - Reminder 2  [facility], Main Office  [address]   [place], [place] [address]   Phone: [phone]  Fax: [phone]     November 17, 2008 MRN: [id number]   Kimberly Patton [address] [place], [place]  [address]   Dear [name],  Our records indicate that it is time to schedule a follow-up appointment.  Dr. Ladona Ridgel recommended that you follow up with [place] in June, 2010. It is very important that we reach you to schedule this appointment.   We look forward to participating in your health care needs. Please contact us at the number listed above at your earliest convenience to schedule your appointment.  If you are unable to make an appointment at this time, give [place] a call so we can update our records.  Sincerely,   Burnard Leigh [facility] Scheduling Team  Reminder #2

## 2010-07-11 NOTE — Letter (Signed)
Summary: Results Follow up Letter  [facility]  [address] [place], [place] [address]   Phone: [phone]  Fax: [phone]    08/10/2008 MRN: [id number]  FELICIA BLOOMQUIST [address] [facility] [facility] [address] [place], [place]  [address]  Dear Ms. TOURVILLE,  The following are the results of your recent test(s):  Test         Result    Pap Smear:        Normal _____  Not Normal _____ Comments: ______________________________________________________ Cholesterol: LDL(Bad cholesterol):         Your goal is less than:         HDL (Good cholesterol):       Your goal is more than: Comments:  ______________________________________________________ Mammogram:        Normal __X___  Not Normal _____ Comments: Repeat in 1 year ___________________________________________________________________ Hemoccult:        Normal _____  Not normal _______ Comments:    _____________________________________________________________________ Other Tests:    We routinely do not discuss normal results over the telephone.  If you desire a copy of the results, or you have any questions about this information we can discuss them at your next office visit.   Sincerely,      Kerby Nora, MD

## 2010-07-11 NOTE — Assessment & Plan Note (Signed)
Summary: poison ivy per bedsole/hmw   Vital Signs:  Patient profile:   55 year old female Height:      67 inches Weight:      317.38 pounds BMI:     49.89 Temp:     97.7 degrees F oral Pulse rate:   64 / minute Pulse rhythm:   regular BP sitting:   112 / 80  (left arm) Cuff size:   large  Vitals Entered By: Linde Gillis CMA Duncan Dull) (Oct 19, 2009 8:03 AM) CC: poison ivy   History of Present Illness: Rash ..red itchy....started after yard work. 2 days ago onset.  Started on left abdomen..now on chest scalp...sleeps with hand on abdomen.  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  given topicort.Marland Kitchenand oral steroid.   Problems Prior to Update: 1)  Contact Dermatitis&other Eczema Due To Plants  (ICD-692.6) 2)  Hernia  (ICD-553.9) 3)  Routine Gynecological Examination  (ICD-V72.31) 4)  Physical Examination  (ICD-V70.0) 5)  Pharyngitis, Persistent  (ICD-462) 6)  Supraventricular Tachycardia  (ICD-427.89) 7)  Asthma, With Acute Exacerbation  (ICD-493.92) 8)  Obesity, Bmi >40  (ICD-278.00) 9)  Fibroids, Uterus  (ICD-218.9) 10)  Hypertension  (ICD-401.9) 11)  Allergic Rhinitis  (ICD-477.9) 12)  Dermatitis, Contact, Nos  (ICD-692.9) 13)  Asthma, Persistent, Moderate  (ICD-493.90)  Current Medications (verified): 1)  Cartia Xt 300 Mg Xr24h-Cap (Diltiazem Hcl Coated Beads) .Marland Kitchen.. 1 Tab By Mouth Daily  Allergies (verified): No Known Drug Allergies  Past History:  Past medical, surgical, family and social histories (including risk factors) reviewed, and no changes noted (except as noted below).  Past Medical History: Reviewed history from 01/27/2008 and no changes required. Asthma   - PFT's January 27, 2008 FEV 1 68 ->82%  after Beta2 Allergic rhinitis Hypertension Morbid Obesity  Past Surgical History: Reviewed history from 04/03/2007 and no changes required. 1995        Gallbladder  removal 1996         Breast biopsy (-) x 2 1970         Appendectomy 1970         Ovarian cyst removal 2000         Hernia surgery - ventral 1995, 1996 2 foot surgeries 1975         Pilonidal cyst                  Skin CA removal  Family History: Reviewed history from 04/03/2007 and no changes required. Father: Died 17's DM, HTN Mother: Alive 62 HTN, DM, CVA Siblings: 4 brothers hemophilia, DM, HTN 3 sisters hemophilia, DM, HTN No MI < 55 HBP: (+) DM:  (+) Breast CA, Mom  Social History: Reviewed history from 04/03/2007 and no changes required. Never Smoked Alcohol use-no Drug use-no Regular exercise-yes, walks at school Marital Status: Married x 21 years Children: 1 son in college, 68 44 years old with hemophilia Occupation: Runner, broadcasting/film/video, Music therapist, Language Arts Diet:  3 meals a day, large dinner, (+) fruits and veggies, (+) H2O  Review of Systems General:  Denies fatigue. CV:  Denies chest pain or discomfort and swelling of feet. Resp:  Denies shortness of breath.  Physical Exam  General:  obese appearin female IN moderate distress from pain Mouth:  no oral lesions, MM, no toungue or lip swelling, oropharynx open Neck:  no cervical or supraclavicular lymphadenopathy  Lungs:  Normal respiratory effort, chest expands  symmetrically. Lungs are clear to auscultation, no crackles or wheezes. Heart:  Normal rate and regular rhythm. S1 and S2 normal without gallop, murmur, click, rub or other extra sounds. Skin:  blistering erythematous linear rash on left abdomen, neck and scalp.   Impression & Recommendations:  Problem # 1:  CONTACT DERMATITIS&OTHER ECZEMA DUE TO PLANTS (ICD-692.6) Gave  steroid injection given severe reaction in past..will call for steroid taper if not improving as expected.  Continue antihistamine daily. The following medications were removed from the medication list:    Claritin 10 Mg Tabs (Loratadine) .Marland Kitchen... 1 tablet by mouth once daily as needed Her  updated medication list for this problem includes:    Prednisone 20 Mg Tabs (Prednisone) .Marland KitchenMarland KitchenMarland KitchenMarland Kitchen 3 tabs by mouth daily x 3 days, then 2 tabs by mouth daily x 2 days then 1 tab by mouth daily x 2 days  Orders: Kenalog 10 mg inj (J3301) Admin of Therapeutic Inj  intramuscular or subcutaneous ([address])  Complete Medication List: 1)  Cartia Xt 300 Mg Xr24h-cap (Diltiazem hcl coated beads) .Marland Kitchen.. 1 tab by mouth daily 2)  Prednisone 20 Mg Tabs (Prednisone) .... 3 tabs by mouth daily x 3 days, then 2 tabs by mouth daily x 2 days then 1 tab by mouth daily x 2 days  Current Allergies (reviewed today): No known allergies    Medication Administration  Injection # 1:    Medication: Kenalog 10 mg inj    Diagnosis: CONTACT DERMATITIS&OTHER ECZEMA DUE TO PLANTS (ICD-692.6)    Route: IM    Site: LUOQ gluteus    Exp Date: 02/10/2011    Lot #: [id number]    Mfr: Bristol-Myers    Comments: 60 mg IM x 1    Patient tolerated injection without complications    Given by: Linde Gillis CMA Duncan Dull) (Oct 19, 2009 8:29 AM)  Orders Added: 1)  Est. Patient Level III [99213] 2)  Kenalog 10 mg inj [J3301] 3)  Admin of Therapeutic Inj  intramuscular or subcutaneous [96372]  Current Allergies (reviewed today): No known allergies    Medication Administration  Injection # 1:    Medication: Kenalog 10 mg inj    Diagnosis: CONTACT DERMATITIS&OTHER ECZEMA DUE TO PLANTS (ICD-692.6)    Route: IM    Site: LUOQ gluteus    Exp Date: 02/10/2011    Lot #: [id number]    Mfr: Bristol-Myers    Comments: 60 mg IM x 1    Patient tolerated injection without complications    Given by: Linde Gillis CMA Duncan Dull) (Oct 19, 2009 8:29 AM)  Orders Added: 1)  Est. Patient Level III [82956] 2)  Kenalog 10 mg inj [J3301] 3)  Admin of Therapeutic Inj  intramuscular or subcutaneous [21308]

## 2010-07-11 NOTE — Assessment & Plan Note (Signed)
Summary: F/U [facility]/  D/C 11/27/07/[facility]   Vital Signs:  Patient Profile:   55 Years Old Female Weight:      347 pounds Temp:     98.3 degrees F oral Pulse rate:   80 / minute Pulse rhythm:   regular BP sitting:   136 / 90  (left arm) Cuff size:   large  Vitals Entered By: Delilah Shan (December 10, 2007 8:55 AM)                 Chief Complaint:  F//U [facility] - D/C 11/27/07.  History of Present Illness: 6/15 went to with SOB, tightness in neck,  ECHO: showed minor thickening in left  side (pt states tech told her it was on right Chest/neck  CT negative  6/24-6/25 hospital admission for shortness of breath  She was found to have SVT with HR 160s She was given adenosine in ER with breif conversion Following that she was placed on cardiazem drip .  Dr. Ladona Ridgel, EP was consulted and she was changed over to oral cardiazem and has remained in NSR. Lisinopril was stopped. She has follow up with Dr. Ladona Ridgel in 4-6 weeks. If SVT recurs he will likely treat with radiofrequency ablation.   Since discharge, Every other day she has had some racing heart symptoms in high 90s she is very emotional, and anxiuos about this she feels that labored breathing  prior to heart racing sensation, no wheezing she has body habitus concerning for sleep apnea but does not snore, and sleeps well  sister has PVC  In hosp: Hg 14.3, A1C 5.5, TSH 2.027      Current Allergies (reviewed today): No known allergies   Past Medical History:    Reviewed history from 11/25/2007 and no changes required:       Asthma       Allergic rhinitis       Hypertension       Morbid Obesity  Past Surgical History:    Reviewed history from 04/03/2007 and no changes required:       1995        Gallbladder removal       1996         Breast biopsy (-) x 2       1970         Appendectomy       1970         Ovarian cyst removal       2000         Hernia surgery - ventral       1995, 1996 2 foot surgeries       1975          Pilonidal cyst                        Skin CA removal     Review of Systems      See HPI  Psych      Complains of easily tearful.      Denies panic attacks and suicidal thoughts/plans.      she gets very emotional when heart rate up   Physical Exam  General:     morbidly obese female in NAD Mouth:     Oral mucosa and oropharynx without lesions or exudates.  Teeth in good repair. MMM Neck:     no carotid bruit or thyromegaly  Lungs:     Normal respiratory effort, chest expands  symmetrically. Lungs are clear to auscultation, no crackles or wheezes. Heart:     Normal rate and regular rhythm. S1 and S2 normal without gallop, murmur, click, rub or other extra sounds. Pulses:     R and L posterior tibial pulses are full and equal bilaterally  Extremities:     trace left pedal edema and trace right pedal edema. Varicose veins B   Skin:     Intact without suspicious lesions or rashes Cervical Nodes:     no cervical or supraclavicular lymphadenopathy     Impression & Recommendations:  Problem # 1:  SUPRAVENTRICULAR TACHYCARDIA (ICD-427.89) Heart rate has intermittantly been in 90's on cadiazem. There is an anxiety component that may be contributing, so we will give her alprazolam to use as needed temporarily. She will call Dr. Ladona Ridgel for possible increase in cardiazem, if SVT returns.  Problem # 2:  HYPERTENSION (ICD-401.9) Restart lisinopril but at half does given BP not at goal. Follow BPs at home.  The following medications were removed from the medication list:    Lisinopril 40 Mg Tabs (Lisinopril) .Marland Kitchen... Take 1 tablet by mouth once a day  Her updated medication list for this problem includes:    Cartia Xt 240 Mg Cp24 (Diltiazem hcl coated beads) .Marland Kitchen... Take 1 tablet by mouth once a day    Lisinopril 40 Mg Tabs (Lisinopril) .Marland Kitchen... 1/2 tab by mouth daily  BP today: 136/90 Prior BP: 110/90 (12/02/2007)   Problem # 3:  SHORTNESS OF BREATH (ICD-786.05) May be due  to SVT, although pt did not have elevated HR at first hospitalization. NMlEKG.  Her SOB does not soud like asthma exacerbation. Pt would like pulm referral and I feel PFTs would be a good idea. Orders: Pulmonary Referral (Pulmonary)   Complete Medication List: 1)  Albuterol 90 Mcg/act Aers (Albuterol) .Marland Kitchen.. 1 or 2 puffs every 4-6 hours as needed 2)  Claritin 10 Mg Tabs (Loratadine) .Marland Kitchen.. 1 tablet by mouth once daily 3)  Cartia Xt 240 Mg Cp24 (Diltiazem hcl coated beads) .... Take 1 tablet by mouth once a day 4)  Lisinopril 40 Mg Tabs (Lisinopril) .... 1/2 tab by mouth daily 5)  Alprazolam 0.25 Mg Tbdp (Alprazolam) .Marland Kitchen.. 1 tab by mouth every 6 hours as needed anxirty, heart racing   Patient Instructions: 1)  Referral Appointment Information 2)  Day/Date: 3)  Time: 4)  Place/MD: 5)  Address: 6)  Phone/Fax: 7)  Patient given appointment information. Information/Orders faxed/mailed.    Prescriptions: ALPRAZOLAM 0.25 MG  TBDP (ALPRAZOLAM) 1 tab by mouth every 6 hours as needed anxirty, heart racing  #30 x 0   Entered and Authorized by:   Kerby Nora MD   Signed by:   Kerby Nora MD on 12/10/2007   Method used:   Print then Give to Patient   RxID:   [id number]  ] Current Allergies (reviewed today): No known allergies  Current Medications (including changes made in today's visit):  ALBUTEROL 90 MCG/ACT  AERS (ALBUTEROL) 1 or 2 puffs every 4-6 hours as needed CLARITIN 10 MG  TABS (LORATADINE) 1 tablet by mouth once daily CARTIA XT 240 MG  CP24 (DILTIAZEM HCL COATED BEADS) Take 1 tablet by mouth once a day LISINOPRIL 40 MG  TABS (LISINOPRIL) 1/2 tab by mouth daily ALPRAZOLAM 0.25 MG  TBDP (ALPRAZOLAM) 1 tab by mouth every 6 hours as needed anxirty, heart racing

## 2010-07-11 NOTE — Assessment & Plan Note (Signed)
Summary: Pulmonary Consult:  Asthma vs residual ACE effect   Visit Type:  Initial Consult Referred by:  Dr. Ermalene Searing PCP:  Dr. Ermalene Searing  Chief Complaint:  SOB.  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  extensive workup at [facility] included on June 16 a CT scan of her neck and chest which were both unremarkable.  She says she was not improved at that time she left the hospital and had a workup by both cardiology and EP for her symptoms of dyspnea and palpitations with a final diagnosis of supraventricular tachycardia and according to the hospital record she was taken off of lisinopril and placed on Cardizem. However, she says she feels the same, just not as bad, with less palpiations, and is now back on Lisinopril for bp control     Updated Prior Medication List: ALBUTEROL 90 MCG/ACT  AERS (ALBUTEROL) 1 or 2 puffs every 4-6 hours as needed CLARITIN 10 MG  TABS (LORATADINE) 1 tablet by mouth once daily CARTIA XT 240 MG  CP24 (DILTIAZEM HCL COATED BEADS) Take 1 tablet by mouth once a day LISINOPRIL 40 MG  TABS (LISINOPRIL) 1/2 tab by mouth daily MUCINEX DM 30-600 MG  TB12 (DEXTROMETHORPHAN-GUAIFENESIN) as needed  Current Allergies: No known allergies   Past Medical History:    Reviewed history from 11/25/2007 and no changes required:       Asthma       Allergic rhinitis       Hypertension       Morbid Obesity   Family History:    Reviewed history from 04/03/2007 and no changes required:       Father: Died 31's DM, HTN       Mother:  Alive 58 HTN, DM, CVA       Siblings: 4 brothers hemophilia, DM, HTN       3 sisters hemophilia, DM, HTN       No MI < 55       HBP: (+)       DM:  (+)       Breast CA, Mom  Social History:    Reviewed history from 04/03/2007 and no changes required:       Never Smoked       Alcohol use-no       Drug use-no       Regular exercise-yes, walks at school       Marital Status: Married x 21 years       Children: 1 son in college, 55 39 years old with hemophilia       Occupation: Runner, broadcasting/film/video, Music therapist, Language Arts       Diet:  3 meals a day, large dinner, (+) fruits and veggies, (+) H2O           Living Situation:  lives with spouse and child /children    Review of Systems  The patient denies anorexia, fever, weight loss, weight gain, vision loss, decreased hearing, hoarseness, chest  pain, syncope, peripheral edema, prolonged cough, headaches, hemoptysis, abdominal pain, melena, hematochezia, hematuria, incontinence, muscle weakness, suspicious skin lesions, transient blindness, difficulty walking, depression, unusual weight change, abnormal bleeding, enlarged lymph nodes, and angioedema.         root canal work was done in early June of 2009 at her jaw still feels numb and swollen   Vital Signs:  Patient Profile:   55 Years Old Female Weight:      353 pounds O2 Sat:      99 % O2 treatment:    Room Air Temp:     97.6 degrees F oral Pulse rate:   83 / minute BP sitting:   130 / 82  (left arm)  Vitals Entered By: Vernie Murders (December 17, 2007 11:48 AM)                 Physical Exam   anxious but pleasant and dilatory moderately obese white female in no acute distress. Afeb with normal vital signs HEENT: nl dentition, turbinates, and orophanx. Nl external ear canals without cough reflex Neck without JVD/Nodes/TM Lungs clear to A and P bilaterally without cough on insp or exp maneuvers. minimal pseudowheeze resolves with purse lip maneuver  RRR no s3 or murmur or increase in P2  Abd soft and benign with nl excursion in the supine position. No bruits or organomegaly Ext warm without calf tenderness, cyanosis clubbing or edema Skin warm and dry without lesions        Impression & Recommendations:  Problem # 1:  SHORTNESS OF BREATH (ICD-786.05) unexplained dyspnea associated with a sensation of chest and neck tightness and choking in retrospect most likely was secondary to ACE inhibitor effects on the upper airway.  ACE inhibitors are probably fueling this problem because they inhibit the metabolism of Upper Air Bradykinin,  a potent mediator of upper airways inflammation. If they are the "fuel" then something else usually is the "spark" (eg viral uri, post nasal drainage, or reflux). In the era of ARB equivalency, at least in the short run, I recommend the ACE be stopped for a six week trial basis. If the problem resolves, the ACE could still be restarted depending on the severity of the severity of the original problem.  In her particular case, because she also has a problem with palpitations, I would avoid ACE inhibitors in favor of calcium channel blockers and/or beta-blockers in terms of treating both her blood pressure.    One likely scenario for her palpitations is that she became anxious related to her neck tightness and used extra albuterol, which have advised her to use only if needed and hope by the time she returns for follow-up he is no longer needed at all.  in terms of treating reflux, I simply recommended a diet and p.r.n. doses of over-the-counter Prilosec.  My experience has been that once the ACE inhibitors eliminated the symptom of overt reflux is minimal and could be treated on a p.r.n. basis and should not prove refractory any way. remains however to be seen if this is the case and that is why a plan to see her back within 4 to 6 weeks with a full set of PFTs, sooner if needed. Orders: Consultation Level V [id number])   Medications Added to Medication  List This Visit: 1)  Mucinex Dm 30-600 Mg Tb12 (Dextromethorphan-guaifenesin) .... As needed 2)  Benicar 20 Mg Tabs (Olmesartan medoxomil) .... One tablet by mouth daily  Complete Medication List: 1)  Albuterol  90 Mcg/act Aers (Albuterol) .Marland Kitchen.. 1 or 2 puffs every 4-6 hours as needed 2)  Claritin 10 Mg Tabs (Loratadine) .Marland Kitchen.. 1 tablet by mouth once daily 3)  Cartia Xt 240 Mg Cp24 (Diltiazem hcl coated beads) .... Take 1 tablet by mouth once a day 4)  Mucinex Dm 30-600 Mg Tb12 (Dextromethorphan-guaifenesin) .... As needed 5)  Benicar 20 Mg Tabs (Olmesartan medoxomil) .... One tablet by mouth daily   Patient Instructions: 1)  GERD (gastroesophageal reflux disease) was discussed. It is a common cause of respiratory symptoms. It commonly presents in the absence of heartburn. GERD can be treated with medication, but also with lifestyle changes including avoidance of late meals, excessive alcohol, smoking cessation, and avoid fatty foods, chocolate, peppermint, colas, red wine, and acidic juices such as orange juice.  2)  NO MINT OR MENTHOL PRODUCTS 3)  USE SUGARLESS CANDY INSTEAD (jolley ranchers)  4)  ok to use prilosec one before bfast if needed for reflux 5)  Please schedule a follow-up appointment in 6 weeks, sooner if needed  with PFT's   ]

## 2010-07-11 NOTE — Progress Notes (Signed)
Summary: sore throat  Phone Note Call from Patient Call back at Home Phone [phone]   Caller: Patient Call For: Kimberly Patton Summary of Call: Pt is complaining of sore throat, no fever.  You saw her son, Wilber Oliphant the other day and he was given an antibiotic.  She wants one too, called to [facility].  I told her she would need to be seen first, but she said since she was here with her son, and she has the same sxs, that maybe you will call her something in to [facility].  I told her to check with the pharmacy later. Initial call taken by: Lowella Petties CMA,  January 06, 2009 4:45 PM  Follow-up for Phone Call        needs to come in--not seen in primary care fo r1 yr--Kimberly Tyler Deis FNP  January 06, 2009 4:59 PM   Offered patient numerous appts. Pt declined due to her schedule. Follow-up by: Lewanda Rife LPN,  January 06, 2009 5:06 PM

## 2010-07-11 NOTE — Assessment & Plan Note (Signed)
Summary: CHECK BLOOD VESSELS IN ARMS/CLE   Vital Signs:  Patient profile:   55 year old female Weight:      329.50 pounds Temp:     97.3 degrees F oral Pulse rate:   88 / minute Pulse rhythm:   regular BP sitting:   140 / 90  (left arm) Cuff size:   large  Vitals Entered By: Selena Batten Dance CMA (AAMA) (April 07, 2010 2:22 PM) CC: Check arms (bruising easily)   History of Present Illness: CC: check arms? spots  61mo h/o easy bruising on arms.  Always bruised easy as child.  This morning hit herself, has linear bruise on L arm.  Feels skin getting thinner, h/o sunbathing.  Bruises eventually go away, but noticing more.  only noticing bruising in arms, none on trunk, legs, no bleeding from gums or other mucous membrane.  Nontender.  Only bruising where scrapes skin.  Took Acai berry for 2 wks in september for weight loss (after bruising started).  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  Not on blood thinners.  Does take baby aspirin but not regularly (maybe once a month).  Does take alleve at night for ankle pain.  Allergies: No Known Drug Allergies  Past History:  Past Medical History: Last updated: 01/27/2008 Asthma   - PFT's January 27, 2008 FEV 1 68 ->82%  after Beta2 Allergic rhinitis Hypertension Morbid Obesity  Past Surgical History: Last updated: 04-22-2007 1995        Gallbladder removal 1996         Breast biopsy (-) x 2 1970         Appendectomy 1970         Ovarian cyst removal 2000         Hernia surgery - ventral 1995, 1996 2 foot surgeries 1975         Pilonidal cyst                  Skin CA removal  Family History: Last updated: 2007-04-22 Father: Died 51's DM, HTN Mother: Alive 5 HTN, DM, CVA Siblings: 4 brothers hemophilia, DM, HTN 3 sisters hemophilia, DM, HTN No MI < 55 HBP: (+) DM:  (+) Breast CA, Mom  Social History: Last updated: April 22, 2007 Never Smoked Alcohol use-no Drug use-no Regular exercise-yes, walks at school Marital Status:  Married x 21 years Children: 1 son in college, 51 48 years old with hemophilia Occupation: Runner, broadcasting/film/video, Music therapist, Language Arts Diet:  3 meals a day, large dinner, (+) fruits and veggies, (+) H2O  Review of Systems       per HPI  Physical Exam  General:  obese appearin female, NAD Lungs:  Normal respiratory effort, chest expands symmetrically. Lungs are clear to auscultation, no crackles or wheezes. Heart:  Normal rate and regular rhythm. S1 and S2 normal without gallop, murmur, click, rub or other extra sounds. Pulses:  2+ rad pulses Extremities:  no edema Skin:  nonblanching macules consistent with petechiae bilateral lateral forearms.  none elsewhere.   Impression & Recommendations:  Problem # 1:  PETECHIAE (ICD-782.7) unsure etiology, but alleve likely worsening.  switch to tylenol.  check CBC to eval for thrombocytopenia, anemia.  diltiazem can cause thrombocytopenia and petechiae.  If plt low, consider change to other antihypertensive.  if low plt, likely advise return in 2-3 wks to f/u and rpt blood work.  consider checking UA to eval for hematuria.  discussed skin damage causing early aging of skin, also discussed normal process of  loss of skin collagen with time.  if not resolving consider referral to heme.  Orders: Specimen Handling (13086) T-CBC w/Diff ([id number]) T-TSH [phone])  Complete Medication List: 1)  Cartia Xt 300 Mg Xr24h-cap (Diltiazem hcl coated beads) .Marland Kitchen.. 1 tab by mouth daily  Patient Instructions: 1)  Come back sooner if spots enlarging or causing more bother or not improving with changing alleve. 2)  stop alleve.  change to tylenol. 3)  blood work today. 4)  This could be the alleve.  Make sure you use sunscreen to prevent early skin aging. 5)  I don't think it's related to the hemophilia carrier state. Prescriptions: CARTIA XT 300 MG XR24H-CAP (DILTIAZEM HCL COATED BEADS) 1 tab by mouth daily  #30 x 11   Entered and Authorized by:   Eustaquio Boyden   MD   Signed by:   Eustaquio Boyden  MD on 04/07/2010   Method used:   [facility]        [facility] [id number]       [address]       [place], [place]  [address]       Ph: [phone] or [phone]       Fax: [phone]   RxID:   [id number]    Orders Added: 1)  Est. Patient Level III [22025] 2)  Specimen Handling [99000] 3)  T-CBC w/Diff [[id number]] 4)  T-TSH [[id number]]    Current Allergies (reviewed today): No known allergies

## 2010-07-11 NOTE — Progress Notes (Signed)
Summary: ? Poison Ivy  Phone Note Call from Patient Call back at Work Phone [phone]   Caller: Patient Call For: Kerby Nora MD Summary of Call: Patient is complaining of having poison ivy on legs, stomach, neck, back of ears, breast, and feet.  She says that she is itching so bad and wants to know what to do.  She had this severe case of poison ivy a few years ago and had to be referred to a dermatologist.  She was given a cream and steroids by mouth.  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  She doesn't mention blisters on her body but she does say that the areas are spreading.  Please advise.  [facility] Initial call taken by: Linde Gillis CMA Duncan Dull),  Oct 18, 2009 3:15 PM  Follow-up for Phone Call        She can make an appt to come in for steroid injection. Needs appt to verify rash is poison ivy before oral steroids are given, but this is an option if appropriate.  Tonight may use benadryl or zyrtec.  Follow-up by: Kerby Nora MD,  Oct 18, 2009 4:19 PM  Additional Follow-up for Phone Call Additional follow up Details #1::        Patient Advised.   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  She thinks it will be very hard to get off work.  She is asking for an 8 a.m. appt.  There are none available this whole week with anyone.  Are you willing to double book?  Lugene Fuquay CMA Duncan Dull)  Oct 18, 2009 4:32 PM     Additional Follow-up for Phone Call Additional follow up Details #2::    Yes..double book tommorow for me.  Follow-up by: Kerby Nora MD,  Oct 18, 2009 4:36 PM  Additional Follow-up for Phone Call Additional follow up Details #3:: Details for Additional Follow-up Action Taken: booked at 8 am in the morning.Consuello Masse CMA  Additional Follow-up by: Benny Lennert CMA Duncan Dull),  Oct 18, 2009 4:40  PM

## 2010-07-11 NOTE — Progress Notes (Signed)
Summary: poison ivy is still spreading  Phone Note Call from Patient Call back at Work Phone [phone]   Caller: Patient Call For: Kerby Nora MD Summary of Call: Pt was seen  on 5/11 for poison ivy.  This is still itching and spreading and she is asking if a steroid cream can be called to [facility].  She saw a surgeon for her hernia and her BP was up, but he told her it was probably because of the steroid injection she had gotten. Initial call taken by: Lowella Petties CMA,  Oct 21, 2009 8:44 AM  Follow-up for Phone Call        I would recommend oral steroid.Marland Kitchenlet me know if she is agreeable.  Follow-up by: Kerby Nora MD,  Oct 21, 2009 8:59 AM  Additional Follow-up for Phone Call Additional follow up Details #1::        patient is agreeable [facility] Additional Follow-up by: Benny Lennert CMA Duncan Dull),  Oct 21, 2009 9:11 AM    New/Updated Medications: PREDNISONE 20 MG TABS (PREDNISONE) 3 tabs by mouth daily x 3 days, then 2 tabs by mouth daily x 2 days then 1 tab by mouth daily x 2 days Prescriptions: PREDNISONE 20 MG TABS (PREDNISONE) 3 tabs by mouth daily x 3 days, then 2 tabs by mouth daily x 2 days then 1 tab by mouth daily x 2 days  #15 x 0   Entered and Authorized by:   Kerby Nora MD   Signed by:   Kerby Nora MD on 10/21/2009   Method used:   [facility]        [facility] [facility]       [address]       [place], [place]  [address]       Ph: [phone] or [phone]       Fax: [phone]   RxID:   [id number]

## 2010-07-11 NOTE — Progress Notes (Signed)
Summary: arm pain/ nosebleeds  Phone Note Call from Patient Call back at Home Phone [phone] Call back at Work Phone [phone]   Caller: Patient Call For: Kaion Tisdale Summary of Call: c/o discomfort  in l arm in between elbow and shoulder ocass finger tips get numb,  pt says if feels like a tightning feeling like something is being pulled tight and it won't let go. she also c/o frequent nosebleeds. 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 if no one else ca see her today, I would suggest [facility]. Follow-up by: Kerby Nora MD,  July 07, 2007 11:18 AM  Additional Follow-up for Phone Call Additional follow up Details #1::        Patient Advised.  Additional Follow-up by: Delilah Shan,  July 07, 2007 11:43 AM

## 2010-07-11 NOTE — Miscellaneous (Signed)
Summary: Orders Update pft charges  Clinical Lists Changes  Orders: Added new Service order of Carbon Monoxide diffusing w/capacity (94720) - Signed Added new Service order of Lung Volumes (94240) - Signed Added new Service order of Spirometry (Pre & Post) (94060) - Signed 

## 2010-07-11 NOTE — Letter (Signed)
Summary: Results Follow up Letter  [place] [facility]  [address] [place], [place] [address]   Phone: [phone]  Fax: [phone]    04/15/2009 MRN: [id number]     LUBERTHA LEITE [address] [place], [place]  [address]    Dear Ms. HEMRICK,  The following are the results of your recent test(s):  Test         Result    Pap Smear:        Normal _____  Not Normal _____ Comments: ______________________________________________________ Cholesterol: LDL(Bad cholesterol):         Your goal is less than:         HDL (Good cholesterol):       Your goal is more than: Comments:  ______________________________________________________ Mammogram:        Normal _____  Not Normal _____ Comments:  ___________________________________________________________________ Hemoccult:        Normal ___x__  Not normal _______ Comments:  Repeat in 1 year  _____________________________________________________________________ Other Tests:    We routinely do not discuss normal results over the telephone.  If you desire a copy of the results, or you have any questions about this information we can discuss them at your next office visit.   Sincerely,   Kerby Nora MD

## 2010-07-11 NOTE — Assessment & Plan Note (Signed)
Summary: CPX W/ PAP  CYD   Vital Signs:  Patient profile:   55 year old female Height:      67 inches Weight:      319.2 pounds BMI:     50.17 Temp:     97.9 degrees F oral Pulse rate:   80 / minute Pulse rhythm:   regular BP sitting:   140 / 100  (left arm) Cuff size:   large  Vitals Entered By: Benny Lennert CMA Duncan Dull) (April 08, 2009 3:19 PM)  History of Present Illness: Chief complaint cpx wiht pap  Doing well  Has lost 20 lbs in last 2 months. Has plans for tummmy tuck and hernia repair, has to lose 100 lbs  to get covered by insurance.  Hypertension History:      She denies headache, chest pain, palpitations, dyspnea with exertion, peripheral edema, neurologic problems, syncope, and side effects from treatment.  unsing mucinex ..not sure if D BP at home 140-150/85-90.        Positive major cardiovascular risk factors include hypertension.  Negative major cardiovascular risk factors include female age less than 19 years old and non-tobacco-user status.     Problems Prior to Update: 1)  Pharyngitis, Persistent  (ICD-462) 2)  Supraventricular Tachycardia  (ICD-427.89) 3)  Muscle Spasm  (ICD-728.85) 4)  Neck Pain  (ICD-723.1) 5)  Shortness of Breath  (ICD-786.05) 6)  Chest Pain, Acute  (ICD-786.50) 7)  Asthma, With Acute Exacerbation  (ICD-493.92) 8)  Obesity, Bmi >40  (ICD-278.00) 9)  Fibroids, Uterus  (ICD-218.9) 10)  Hypertension  (ICD-401.9) 11)  Allergic Rhinitis  (ICD-477.9) 12)  Dermatitis, Contact, Nos  (ICD-692.9) 13)  Asthma, Persistent, Moderate  (ICD-493.90)  Current Medications (verified): 1)  Cartia Xt 300 Mg Xr24h-Cap (Diltiazem Hcl Coated Beads) .Marland Kitchen.. 1 Tab By Mouth Daily 2)  Qvar 40 Mcg/act  Aers (Beclomethasone Dipropionate) .... Two Puffs in The Morning and At Night As Needed 3)  Claritin 10 Mg  Tabs (Loratadine) .Marland Kitchen.. 1 Tablet By Mouth Once Daily As Needed 4)  Mucinex Dm 30-600 Mg  Tb12 (Dextromethorphan-Guaifenesin) .... As Needed 5)   Cheratussin Ac 100-10 Mg/77ml Syrp (Guaifenesin-Codeine) .Marland Kitchen.. 1-2 Tsp By Mouth At Bedtime As Needed Cough  Allergies (verified): No Known Drug Allergies  Past History:  Past medical, surgical, family and social histories (including risk factors) reviewed, and no changes noted (except as noted below).  Past Medical History: Reviewed history from 01/27/2008 and no changes required. Asthma   - PFT's January 27, 2008 FEV 1 68 ->82%  after Beta2 Allergic rhinitis Hypertension Morbid Obesity  Past Surgical History: Reviewed history from 04/03/2007 and no changes required. 1995        Gallbladder removal 1996         Breast biopsy (-) x 2 1970         Appendectomy 1970         Ovarian cyst removal 2000         Hernia surgery - ventral 1995, 1996 2 foot surgeries 1975         Pilonidal cyst                  Skin CA removal  Family History: Reviewed history from 04/03/2007 and no changes required. Father: Died 51's DM, HTN Mother: Alive 55 HTN, DM, CVA Siblings: 4 brothers hemophilia, DM, HTN 3 sisters hemophilia, DM, HTN No MI < 55 HBP: (+) DM:  (+) Breast CA, Mom  Social History: Reviewed history  from 04/03/2007 and no changes required. Never Smoked Alcohol use-no Drug use-no Regular exercise-yes, walks at school Marital Status: Married x 21 years Children: 1 son in college, 51 14 years old with hemophilia Occupation: Runner, broadcasting/film/video, Music therapist, Language Arts Diet:  3 meals a day, large dinner, (+) fruits and veggies, (+) H2O  Review of Systems General:  Denies fatigue and fever. CV:  Denies chest pain or discomfort. Resp:  Denies shortness of breath; chest cold, congestion x 3-4 days, no ear pain, no face pain. GI:  Denies abdominal pain, bloody stools, and constipation.  Physical Exam  General:  morbidly obese female in NAD Head:  no maxillary sinus ttp Eyes:  No corneal or conjunctival inflammation noted. EOMI. Perrla. Funduscopic exam benign, without hemorrhages, exudates  or papilledema. Vision grossly normal. Ears:  External ear exam shows no significant lesions or deformities.  Otoscopic examination reveals clear canals, tympanic membranes are intact bilaterally without bulging, retraction, inflammation or discharge. Hearing is grossly normal bilaterally. Nose:  nasal discharge, no mucosal pallor.   Mouth:  good dentition, no exudates, and pharyngeal erythema.   Neck:  no carotid bruit or thyromegaly no cervical or supraclavicular lymphadenopathy  Chest Wall:  No deformities, masses, or tenderness noted. Breasts:  No mass, nodules, thickening, tenderness, bulging, retraction, inflamation, nipple discharge or skin changes noted.   Lungs:  Normal respiratory effort, chest expands symmetrically. Lungs are clear to auscultation, no crackles or wheezes. Heart:  Normal rate and regular rhythm. S1 and S2 normal without gallop, murmur, click, rub or other extra sounds. Abdomen:  Bowel sounds positive,abdomen soft and non-tender without masses, organomegaly or hernias noted. Genitalia:  Pelvic Exam:        External: normal female genitalia without lesions or masses        Vagina: normal without lesions or masses        Cervix: normal without lesions or masses        Adnexa: normal bimanual exam without masses or fullness        Uterus: normal by palpation        Pap smear: performed Msk:  No deformity or scoliosis noted of thoracic or lumbar spine.   Pulses:  R and L posterior tibial pulses are full and equal bilaterally  Extremities:  no edema Neurologic:  No cranial nerve deficits noted. Station and gait are normal. Sensory, motor and coordinative functions appear intact. Skin:  Intact without suspicious lesions or rashes Psych:  Cognition and judgment appear intact. Alert and cooperative with normal attention span and concentration. No apparent delusions, illusions, hallucinations   Impression & Recommendations:  Problem # 1:  Preventive Health Care (ICD-V70.0)  Reviewed preventive care protocols, scheduled due services, and updated immunizations. Encouraged exercise, weight loss, healthy eating habits.   Problem # 2:  Gynecological examination-routine (ICD-V72.31) PAP pending.  Problem # 3:  HYPERTENSION (ICD-401.9) Not at goal...increase cartia to 300 mg daily. Follow Bps at home and call if not improving.  Her updated medication list for this problem includes:    Cartia Xt 300 Mg Xr24h-cap (Diltiazem hcl coated beads) .Marland Kitchen... 1 tab by mouth daily  BP today: 140/100 Prior BP: 151/90 (01/11/2009)  10 Yr Risk Heart Disease: Not enough information  Problem # 4:  URI (ICD-465.9) Symptomatic care. Her updated medication list for this problem includes:    Claritin 10 Mg Tabs (Loratadine) .Marland Kitchen... 1 tablet by mouth once daily as needed    Mucinex Dm 30-600 Mg Tb12 (Dextromethorphan-guaifenesin) .Marland Kitchen... As needed  Cheratussin Ac 100-10 Mg/58ml Syrp (Guaifenesin-codeine) .Marland Kitchen... 1-2 tsp by mouth at bedtime as needed cough  Complete Medication List: 1)  Cartia Xt 300 Mg Xr24h-cap (Diltiazem hcl coated beads) .Marland Kitchen.. 1 tab by mouth daily 2)  Qvar 40 Mcg/act Aers (Beclomethasone dipropionate) .... Two puffs in the morning and at night as needed 3)  Claritin 10 Mg Tabs (Loratadine) .Marland Kitchen.. 1 tablet by mouth once daily as needed 4)  Mucinex Dm 30-600 Mg Tb12 (Dextromethorphan-guaifenesin) .... As needed 5)  Cheratussin Ac 100-10 Mg/24ml Syrp (Guaifenesin-codeine) .Marland Kitchen.. 1-2 tsp by mouth at bedtime as needed cough  Hypertension Assessment/Plan:      The patient's hypertensive risk group is category A: No risk factors and no target organ damage.  Today's blood pressure is 140/100.  Her blood pressure goal is < 140/90.  Patient Instructions: 1)  Fasting lipids, CMET Dx 401.1 ASAP. 2)  Return stool cards. Follow BP at home call if greater than 140/90 on diltiazem 300mg  daily. 3)  Please schedule a follow-up appointment in 6 months  BPcheck.  Prescriptions: CHERATUSSIN  AC 100-10 MG/5ML SYRP (GUAIFENESIN-CODEINE) 1-2 tsp by mouth at bedtime as needed cough  #8 oz x 0   Entered and Authorized by:   Kerby Nora MD   Signed by:   Kerby Nora MD on 04/08/2009   Method used:   Print then Give to Patient   RxID:   [id number] CARTIA XT 300 MG XR24H-CAP (DILTIAZEM HCL COATED BEADS) 1 tab by mouth daily  #30 x 11   Entered and Authorized by:   Kerby Nora MD   Signed by:   Kerby Nora MD on 04/08/2009   Method used:   [facility]        [facility]  [address] [id number]       [address]       [place], [place]  [address]       Ph: [phone] or [phone]       Fax: [phone]   RxID:   [id number]   Current Allergies (reviewed today): No known allergies   Last Flu Vaccine:  Fluvax 3+ (06/11/2005 8:48:18 AM) Flu Vaccine Result Date:  04/08/2009 Flu Vaccine Result:  given  Flu Vaccine Next Due:  1 yr Flex Sig Next Due:  Not Indicated

## 2010-07-11 NOTE — Consult Note (Signed)
Summary: [facility]/Consultation/Dr. Garth Schlatter [facility]/Consultation/Dr. Ladona Ridgel   Imported By: Mickle Asper 12/25/2007 16:57:29  _____________________________________________________________________  External Attachment:    Type:   Image     Comment:   External Document  Appended Document: [facility]/Consultation/Dr. Ladona Ridgel symptomatic SVT changing to cardizem would consider ablation if recurs

## 2010-07-11 NOTE — Letter (Signed)
Summary: [facility]   Imported By: Beau Fanny 12/02/2007 13:24:11  _____________________________________________________________________  External Attachment:    Type:   Image     Comment:   External Document

## 2010-07-11 NOTE — Progress Notes (Signed)
Summary: ? Referral  Phone Note Call from Patient Call back at [phone]   Caller: Patient Call For: Kerby Nora MD Summary of Call: Patient left message stating that she has a hernia and wants a referral to the surgeon.  Left message for patient to call back with additional information.  Where is the hernia and what surgeon? Initial call taken by: Sydell Axon,  October 04, 2008 12:21 PM  Follow-up for Phone Call        Pt called back and said that she has a bulging on her abd that she is sure is a hernia.  She has made appt with her surgeon in [place] and will not need referral from [place]. Follow-up by: Lowella Petties,  October 05, 2008 10:06 AM

## 2010-07-11 NOTE — Progress Notes (Signed)
Summary: rx  Phone Note Call from Patient Call back at Work Phone [phone]   Caller: Patient Call For: wright Reason for Call: Refill Medication, Talk to Nurse Summary of Call: benicar 20mg   need rx [facility] - [phone] Initial call taken by: Eugene Gavia,  March 08, 2008 4:50 PM      Prescriptions: BENICAR 20 MG  TABS (OLMESARTAN MEDOXOMIL) One tablet by mouth daily  #30 x 6   Entered by:   Marinus Maw   Authorized by:   Nyoka Cowden MD   Signed by:   Marinus Maw on 03/08/2008   Method used:   [facility] ...       [facility]  [address] [phone] ([facility]       [address]       [place], [place]  [address]       Ph: [phone]       Fax: [phone]   RxID:   [id number]

## 2010-07-11 NOTE — Assessment & Plan Note (Signed)
Summary: conges./bir   Vital Signs:  Patient Profile:   55 Years Old Female Weight:      355.38 pounds Temp:     98.1 degrees F oral Pulse rate:   92 / minute BP sitting:   148 / 88  (left arm) Cuff size:   large  Vitals Entered By: Wandra Mannan (June 10, 2007 12:33 PM)                 Chief Complaint:  chest congestion.  Acute Visit History:      The patient complains of cough, earache, and nasal discharge.  These symptoms began 1 week ago.  She denies fever, headache, and sore throat.  Other comments include: using albuterol once , using mucinex worse when lying down.        The patient notes wheezing.  The cough interferes with her sleep.  The character of the cough is described as productive.        The earache is located on both sides.        Current Allergies (reviewed today): No known allergies   Past Medical History:    Reviewed history from 04/03/2007 and no changes required:       Asthma       Allergic rhinitis       Hypertension     Review of Systems      See HPI   Physical Exam  General:     alert.  NAD, morbidly obese Ears:     clear fluid B TMS Nose:     nasal dischargemucosal pallor.   Mouth:     Oral mucosa and oropharynx without lesions or exudates.  Teeth in good repair. Lungs:     frequent hakking cough, with prolongued expiratory phase, no wheeze, rhonchi Heart:     Normal rate and regular rhythm. S1 and S2 normal without gallop, murmur, click, rub or other extra sounds.    Impression & Recommendations:  Problem # 1:  ASTHMA, WITH ACUTE EXACERBATION (ICD-493.92) Call if not improving with steroids, antibiotics and albuterol as needed.  To ER for severe SOB. The following medications were removed from the medication list:    Prednisone 20 Mg Tabs (Prednisone) .Marland Kitchen... 2 daily for 7 days then 1 daily for 7 days  Her updated medication list for this problem includes:    Albuterol 90 Mcg/act Aers (Albuterol) .Marland Kitchen... 1 or 2 puffs  every 4-6 hours as needed    Prednisone 20 Mg Tabs (Prednisone) .Marland KitchenMarland KitchenMarland KitchenMarland Kitchen 3 tabs by mouth daily x 3 days, then 2 tabs by mouth daily x 2 days then 1 tab by mouth daily x 2 days   Complete Medication List: 1)  Lisinopril 40 Mg Tabs (Lisinopril) .... Take 1 tablet by mouth once a day 2)  Albuterol 90 Mcg/act Aers (Albuterol) .Marland Kitchen.. 1 or 2 puffs every 4-6 hours as needed 3)  Prednisone 20 Mg Tabs (Prednisone) .... 3 tabs by mouth daily x 3 days, then 2 tabs by mouth daily x 2 days then 1 tab by mouth daily x 2 days 4)  Azithromycin 250 Mg Tabs (Azithromycin) .... 2 tab by mouth x1 day, then 1 tab by mouth daily     Prescriptions: AZITHROMYCIN 250 MG  TABS (AZITHROMYCIN) 2 tab by mouth x1 day, then 1 tab by mouth daily  #6 x 0   Entered and Authorized by:   Kerby Nora MD   Signed by:   Kerby Nora MD on 06/10/2007   Method  used:   [facility] ...       [facility]  [address]  [id number]       [address]       [place], [place]  [address]       Ph: [phone] or [phone]       Fax: [phone]   RxID:   [id number] ALBUTEROL 90 MCG/ACT  AERS (ALBUTEROL) 1 or 2 puffs every 4-6 hours as needed  #1 x 1   Entered and Authorized by:   Kerby Nora MD   Signed by:   Kerby Nora MD on 06/10/2007   Method used:   Electronically sent to ...       [facility]  [address]  [id number]       [address]       [place], [place]  [address]       Ph: [phone] or [phone]       Fax: [phone]   RxID:   [id number] ALBUTEROL 90 MCG/ACT  AERS (ALBUTEROL) 1 or 2 puffs every 4-6 hours as needed  #0 x 0   Entered and Authorized by:   Kerby Nora MD   Signed by:   Kerby Nora MD on 06/10/2007   Method used:   Electronically sent to ...       [facility]  [address]  [phone]*       [address]       [place], [place]  [address]       Ph: [phone] or [phone]       Fax: [phone]   RxID:   [id number] PREDNISONE 20 MG  TABS (PREDNISONE) 3 tabs by mouth daily x 3 days,  then 2 tabs by mouth daily x 2 days then 1 tab by mouth daily x 2 days  #15 x 0   Entered and Authorized by:   Kerby Nora MD   Signed by:   Kerby Nora MD on 06/10/2007   Method used:   Electronically sent to ...       [facility]  [address]  [phone]*       [address]       [place], [place]  [address]       Ph: [phone] or [phone]       Fax: [phone]   RxID:   [id number]  ] Current Allergies (reviewed today): No known allergies  Current Medications (including changes made in today's visit):  LISINOPRIL 40 MG TABS (LISINOPRIL) Take 1 tablet by mouth once a day ALBUTEROL 90 MCG/ACT  AERS (ALBUTEROL) 1 or 2 puffs every 4-6 hours as needed PREDNISONE 20 MG  TABS (PREDNISONE) 3 tabs by mouth daily x 3 days, then 2 tabs by mouth daily x 2 days then 1 tab by mouth daily x 2 days AZITHROMYCIN 250 MG  TABS (AZITHROMYCIN) 2 tab by mouth x1 day, then 1 tab by mouth daily

## 2010-07-11 NOTE — Progress Notes (Signed)
  Phone Note Call from Patient Call back at Home Phone [phone]   Caller: Patient Summary of Call: Called at 5:17PM Still having spasms in chest she can actually see her clothes move with the spasm and it will move up to her neck No real pain gets a feeling of SOB---but not clear cut and may be related to her being nervous about this Reviewed the negative hospital work up she described---no aneurysm, ischemia or apparent PE. Mild increased right heart size so question of pulmonary process Nothing definite though  she asks whether she needs to be reevaluated Initial call taken by: Cindee Salt MD,  November 29, 2007 7:49 PM  Follow-up for Phone Call        doesn't sound like a serious process given her recent work up and apparent clear cut muscle spasm Muscle relaxer helps neck some but not chest  can keep trying the muscle relaxer ER only if real sig change May need to push up her hospital follow up appt sooner than 7/7 Follow-up by: Cindee Salt MD,  November 29, 2007 7:50 PM

## 2010-07-11 NOTE — Letter (Signed)
Summary: Results Follow up Letter  [name] at [facility]  [address] [place], [place] [address]   Phone: [phone]  Fax: [phone]    04/14/2009 MRN: [id number]     Kimberly Patton [address] [facility] [address] [place], [place]  [address]    Dear Ms. [name],  The following are the results of your recent test(s):  Test         Result    Pap Smear:        Normal __x___  Not Normal _____ Comments:Repeat in 1 year ______________________________________________________ Cholesterol: LDL(Bad cholesterol):         Your goal is less than:         HDL (Good cholesterol):       Your goal is more than: Comments:  ______________________________________________________ Mammogram:        Normal _____  Not Normal _____ Comments:  ___________________________________________________________________ Hemoccult:        Normal _____  Not normal _______ Comments:    _____________________________________________________________________ Other Tests:    We routinely do not discuss normal results over the telephone.  If you desire a copy of the results, or you have any questions about this information we can discuss them at your next office visit.   Sincerely,   Kerby Nora MD

## 2010-07-11 NOTE — Progress Notes (Signed)
Summary: hernia repair  Phone Note Call from Patient Call back at Work Phone [phone]   Caller: Patient Call For: Kerby Nora MD Summary of Call: Patient is asking if she can get a recomendation for some where in the [place]. area to go for her hernia repair. She says that driving to [place] with the prices of gas is really hard.  Initial call taken by: Melody Comas,  Oct 11, 2009 3:47 PM  Follow-up for Phone Call        Patient has called twice and wants a call back today regarding a referral to a surgeon in [place].  Please advise.  Linde Gillis CMA Duncan Dull)  Oct 12, 2009 2:57 PM   Additional Follow-up for Phone Call Additional follow up Details #1::        Called patient and gave her the name and number to [facility] in [place]. She will call herself to schedule. Additional Follow-up by: Carlton Adam,  Oct 13, 2009 8:39 AM  New Problems: HERNIA (ICD-553.9)   New Problems: HERNIA (ICD-553.9)

## 2010-07-11 NOTE — Assessment & Plan Note (Signed)
Summary: 8:30 RIB PAIN/CLE   Vital Signs:  Patient profile:   55 year old female Height:      67 inches Weight:      314.2 pounds BMI:     49.39 Temp:     97.7 degrees F oral Pulse rate:   80 / minute Pulse rhythm:   regular BP sitting:   160 / 90  (left arm) Cuff size:   large  Vitals Entered By: Benny Lennert CMA Duncan Dull) (August 08, 2009 8:35 AM)  History of Present Illness: Chief complaint fell down stairs and fell on top on baby gate and is having alot of rib pain  Morbidly obese female fell halfway down stairs 5 days ago. Landed on left side with pressure on baby gate. GAte pushed up underneath breast. No symptoms preceding fall..lost balance. No preceding chest pain. Pain with deep breaths. Cannot lay on that side. No cough.  Has taken husband's vicodin..helped pain minimally.  Problems Prior to Update: 1)  Uri  (ICD-465.9) 2)  Routine Gynecological Examination  (ICD-V72.31) 3)  Physical Examination  (ICD-V70.0) 4)  Pharyngitis, Persistent  (ICD-462) 5)  Supraventricular Tachycardia  (ICD-427.89) 6)  Muscle Spasm  (ICD-728.85) 7)  Neck Pain  (ICD-723.1) 8)  Shortness of Breath  (ICD-786.05) 9)  Chest Pain, Acute  (ICD-786.50) 10)  Asthma, With Acute Exacerbation  (ICD-493.92) 11)  Obesity, Bmi >40  (ICD-278.00) 12)  Fibroids, Uterus  (ICD-218.9) 13)  Hypertension  (ICD-401.9) 14)  Allergic Rhinitis  (ICD-477.9) 15)  Dermatitis, Contact, Nos  (ICD-692.9) 16)  Asthma, Persistent, Moderate  (ICD-493.90)  Current Medications (verified): 1)  Cartia Xt 300 Mg Xr24h-Cap (Diltiazem Hcl Coated Beads) .Marland Kitchen.. 1 Tab By Mouth Daily 2)  Qvar 40 Mcg/act  Aers (Beclomethasone Dipropionate) .... Two Puffs in The Morning and At Night As Needed 3)  Claritin 10 Mg  Tabs (Loratadine) .Marland Kitchen.. 1 Tablet By Mouth Once Daily As Needed 4)  Mucinex Dm 30-600 Mg  Tb12 (Dextromethorphan-Guaifenesin) .... As Needed  Allergies (verified): No Known Drug Allergies  Past History:  Past  medical, surgical, family and social histories (including risk factors) reviewed, and no changes noted (except as noted below).  Past Medical History: Reviewed history from 01/27/2008 and no changes required. Asthma   - PFT's January 27, 2008 FEV 1 68 ->82%  after Beta2 Allergic rhinitis Hypertension Morbid Obesity  Past Surgical History: Reviewed history from 04/03/2007 and no changes required. 1995        Gallbladder removal 1996         Breast biopsy (-) x 2 1970         Appendectomy 1970         Ovarian cyst removal 2000         Hernia surgery - ventral 1995, 1996 2 foot surgeries 1975         Pilonidal cyst                  Skin CA removal  Family History: Reviewed history from 04/03/2007 and no changes required. Father: Died 18's DM, HTN Mother: Alive 65 HTN, DM, CVA Siblings: 4 brothers hemophilia, DM, HTN 3 sisters hemophilia, DM, HTN No MI < 55 HBP: (+) DM:  (+) Breast CA, Mom  Social History: Reviewed history from 04/03/2007 and no changes required. Never Smoked Alcohol use-no Drug use-no Regular exercise-yes, walks at school Marital Status: Married x 21 years Children: 1 son in college, 43 1 years old with hemophilia Occupation: Runner, broadcasting/film/video, Music therapist, Language Arts  Diet:  3 meals a day, large dinner, (+) fruits and veggies, (+) H2O  Review of Systems General:  Denies fatigue and fever. CV:  Complains of chest pain or discomfort; denies palpitations and swelling of feet. Resp:  Complains of chest pain with inspiration; denies shortness of breath, sputum productive, and wheezing. GI:  Denies abdominal pain. GU:  Denies dysuria.  Physical Exam  General:  obese appearin female IN moderate distress from pain Mouth:  MMM Neck:  no carotid bruit or thyromegaly .ndoes  Chest Wall:  tearful with deep breaths, very ttp over left anterior chest wall focaly.  Breasts:  No specific breat pain Lungs:  Normal respiratory effort, chest expands symmetrically. Lungs are  clear to auscultation, no crackles or wheezes. Heart:  Normal rate and regular rhythm. S1 and S2 normal without gallop, murmur, click, rub or other extra sounds. Skin:  No contusion, no hematonma   Impression & Recommendations:  Problem # 1:  CHEST WALL PAIN, ACUTE (ICD-786.52) lLikely rib fracture or chest wall muscle injury. Encouraged her to take deep breaths, no binding given increase in risk of pneumonia.  Will get CXR to eval lung and ribs.Marland Kitchenlung exam clear through out.  Orders: Radiology Referral (Radiology)  Complete Medication List: 1)  Cartia Xt 300 Mg Xr24h-cap (Diltiazem hcl coated beads) .Marland Kitchen.. 1 tab by mouth daily 2)  Qvar 40 Mcg/act Aers (Beclomethasone dipropionate) .... Two puffs in the morning and at night as needed 3)  Claritin 10 Mg Tabs (Loratadine) .Marland Kitchen.. 1 tablet by mouth once daily as needed 4)  Mucinex Dm 30-600 Mg Tb12 (Dextromethorphan-guaifenesin) .... As needed 5)  Oxycodone-acetaminophen 5-325 Mg Tabs (Oxycodone-acetaminophen) .Marland Kitchen.. 1 tab by mouth q6 hours as needed pain  Patient Instructions: 1)  Referral Appointment Information 2)  Day/Date: 3)  Time: 4)  Place/MD: 5)  Address: 6)  Phone/Fax: 7)  Patient given appointment information. Information/Orders faxed/mailed.  8)   Follow up in 1 week.  Prescriptions: OXYCODONE-ACETAMINOPHEN 5-325 MG TABS (OXYCODONE-ACETAMINOPHEN) 1 tab by mouth q6 hours as needed pain  #60 x 0   Entered and Authorized by:   Kerby Nora MD   Signed by:   Kerby Nora MD on 08/08/2009   Method used:   Print then Give to Patient   RxID:   [id number]   Current Allergies (reviewed today): No known allergies

## 2010-07-11 NOTE — Assessment & Plan Note (Signed)
Summary: SOB, wheezing   Vital Signs:  Patient Profile:   55 Years Old Female Weight:      345.75 pounds Temp:     96.7 degrees F oral Pulse rate:   72 / minute Pulse rhythm:   regular Resp:     20 per minute BP sitting:   110 / 90  (left arm) Cuff size:   large  Vitals Entered By: Silas Sacramento (December 02, 2007 12:24 PM)                 Chief Complaint:  SOB and wheezing.  History of Present Illness: Having bad spasms--3 days ago and again last night Can see it through her clothes and then goes into the back of her neck and she gets choking sensation Some SOB with tight feeling Has wheezing but this goes back for a long time Lasted all day 3 days ago--that is why she called. Took muscle relaxer and it helped her neck at least but not really her chest Last night--lasted about 3 hours spasm is not heart rate related--initially had called them "palpitations" but not really  Muscle relaxer helps Still uses her inhaler    Current Allergies (reviewed today): No known allergies   Past Medical History:    Reviewed history from 11/25/2007 and no changes required:       Asthma       Allergic rhinitis       Hypertension       Morbid Obesity  Past Surgical History:    Reviewed history from 04/03/2007 and no changes required:       1995        Gallbladder removal       1996         Breast biopsy (-) x 2       1970         Appendectomy       1970         Ovarian cyst removal       2000         Hernia surgery - ventral       1995, 1996 2 foot surgeries       1975         Pilonidal cyst                        Skin CA removal   Social History:    Reviewed history from 04/03/2007 and no changes required:       Never Smoked       Alcohol use-no       Drug use-no       Regular exercise-yes, walks at school       Marital Status: Married x 21 years       Children: 1 son in college, 58 89 years old with hemophilia       Occupation: Runner, broadcasting/film/video, Music therapist, Language Arts       Diet:   3 meals a day, large dinner, (+) fruits and veggies, (+) H2O           Review of Systems  The patient denies chest pain and syncope.         Thinks she has lost 15# since being in hospital--down 10# since visit here in December some dizziness   Physical Exam  General:     alert and normal appearance.   Neck:     supple, full ROM, no masses, no carotid  bruits, and no cervical lymphadenopathy.   Chest Wall:     no tenderness or muscle spasm Lungs:     normal respiratory effort, normal breath sounds, and no wheezes.   Heart:     normal rate, regular rhythm, no murmur, and no gallop.   Extremities:     thick calves but no pitting in ankles Psych:     normally interactive, good eye contact, not anxious appearing, and not depressed appearing.      Impression & Recommendations:  Problem # 1:  MUSCLE SPASM (ICD-728.85) Assessment: New persists since hospitalization nothing worrisome found on testing nothing serious comes to mind with current symptoms  P: continue as needed flexeril    keep appt with Dr Ermalene Searing  Complete Medication List: 1)  Lisinopril 40 Mg Tabs (Lisinopril) .... Take 1 tablet by mouth once a day 2)  Albuterol 90 Mcg/act Aers (Albuterol) .Marland Kitchen.. 1 or 2 puffs every 4-6 hours as needed 3)  Claritin 10 Mg Tabs (Loratadine) .Marland Kitchen.. 1 tablet by mouth once daily   Patient Instructions: 1)  Keep appointment with Dr Ermalene Searing next week   ] Current Allergies (reviewed today): No known allergies

## 2010-08-22 LAB — CBC
HCT: 40.2 % (ref 36.0–46.0)
Hemoglobin: 13.1 g/dL (ref 12.0–15.0)
MCH: 28.5 pg (ref 26.0–34.0)
MCHC: 32.6 g/dL (ref 30.0–36.0)
MCV: 87.4 fL (ref 78.0–100.0)
Platelets: 256 10*3/uL (ref 150–400)
RBC: 4.6 MIL/uL (ref 3.87–5.11)
RDW: 13.7 % (ref 11.5–15.5)
WBC: 9.8 10*3/uL (ref 4.0–10.5)

## 2010-08-22 LAB — URINE CULTURE
Colony Count: 45000
Culture  Setup Time: 201111262018

## 2010-08-22 LAB — DIFFERENTIAL
Basophils Absolute: 0 10*3/uL (ref 0.0–0.1)
Basophils Relative: 0 % (ref 0–1)
Eosinophils Absolute: 0.1 10*3/uL (ref 0.0–0.7)
Eosinophils Relative: 1 % (ref 0–5)
Lymphocytes Relative: 21 % (ref 12–46)
Lymphs Abs: 2.1 10*3/uL (ref 0.7–4.0)
Monocytes Absolute: 0.5 10*3/uL (ref 0.1–1.0)
Monocytes Relative: 5 % (ref 3–12)
Neutro Abs: 7.1 10*3/uL (ref 1.7–7.7)
Neutrophils Relative %: 72 % (ref 43–77)

## 2010-08-22 LAB — POCT URINALYSIS DIPSTICK
Bilirubin Urine: NEGATIVE
Glucose, UA: NEGATIVE mg/dL
Ketones, ur: NEGATIVE mg/dL
Nitrite: NEGATIVE
Protein, ur: NEGATIVE mg/dL
Specific Gravity, Urine: 1.02 (ref 1.005–1.030)
Urobilinogen, UA: 0.2 mg/dL (ref 0.0–1.0)
pH: 5.5 (ref 5.0–8.0)

## 2010-08-22 LAB — COMPREHENSIVE METABOLIC PANEL
ALT: 21 U/L (ref 0–35)
AST: 17 U/L (ref 0–37)
Albumin: 3.7 g/dL (ref 3.5–5.2)
Alkaline Phosphatase: 81 U/L (ref 39–117)
BUN: 10 mg/dL (ref 6–23)
CO2: 30 mEq/L (ref 19–32)
Calcium: 9.3 mg/dL (ref 8.4–10.5)
Chloride: 100 mEq/L (ref 96–112)
Creatinine, Ser: 0.41 mg/dL (ref 0.4–1.2)
GFR calc Af Amer: >60 mL/min (ref >60)
GFR calc non Af Amer: >60 mL/min (ref >60)
Glucose, Bld: 89 mg/dL (ref 70–99)
Potassium: 3.9 mEq/L (ref 3.5–5.1)
Sodium: 136 mEq/L (ref 135–145)
Total Bilirubin: 0.4 mg/dL (ref 0.3–1.2)
Total Protein: 7.2 g/dL (ref 6.0–8.3)

## 2010-08-22 LAB — URINE MICROSCOPIC-ADD ON

## 2010-08-22 LAB — URINALYSIS, ROUTINE W REFLEX MICROSCOPIC
Bilirubin Urine: NEGATIVE
Glucose, UA: NEGATIVE mg/dL
Hgb urine dipstick: NEGATIVE
Ketones, ur: NEGATIVE mg/dL
Nitrite: NEGATIVE
Protein, ur: NEGATIVE mg/dL
Specific Gravity, Urine: 1.033 — ABNORMAL HIGH (ref 1.005–1.030)
Urobilinogen, UA: 0.2 mg/dL (ref 0.0–1.0)
pH: 5 (ref 5.0–8.0)

## 2010-08-22 LAB — LIPASE, BLOOD: Lipase: 35 U/L (ref 11–59)

## 2010-08-26 ENCOUNTER — Encounter: Payer: Self-pay | Admitting: Family Medicine

## 2010-08-26 ENCOUNTER — Ambulatory Visit (INDEPENDENT_AMBULATORY_CARE_PROVIDER_SITE_OTHER): Payer: BC Managed Care – PPO | Admitting: Family Medicine

## 2010-08-26 DIAGNOSIS — R609 Edema, unspecified: Secondary | ICD-10-CM | POA: Insufficient documentation

## 2010-08-26 DIAGNOSIS — R233 Spontaneous ecchymoses: Secondary | ICD-10-CM

## 2010-08-29 NOTE — Assessment & Plan Note (Signed)
Summary: leg swelling/alc   Vital Signs:  Patient profile:   55 year old female Weight:      335.25 pounds BMI:     52.70 Temp:     97.7 degrees F oral Pulse rate:   72 / minute Pulse rhythm:   regular BP sitting:   150 / 98  (left arm) Cuff size:   large  Vitals Entered By: Selena Batten Dance CMA Duncan Dull) (August 26, 2010 9:14 AM) CC: Leg swelling x1 day   History of Present Illness: Pt here because of swelling in L LE.  No calf pain, no CP, no SOB.  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  Pt called office and was told to keep in elevated and f/u today.  She actually was told she may need to go to ER but she preferred to come in today.    Current Medications (verified): 1)  Cartia Xt 300 Mg Xr24h-Cap (Diltiazem Hcl Coated Beads) .Marland Kitchen.. 1 Tab By Mouth Daily 2)  Multivitamins  Tabs (Multiple Vitamin) .Marland Kitchen.. 1 By Mouth Once Daily 3)  Calcium 1200-1000 Mg-Unit Chew (Calcium Carbonate-Vit D-Min) .Marland Kitchen.. 1 By Mouth Once Daily 4)  Glucosamine-Chondroitin 1500-1200 Mg/15ml Liqd (Glucosamine-Chondroitin) .Marland Kitchen.. 1 By Mouth Once Daily 5)  Hydrochlorothiazide 25 Mg Tabs (Hydrochlorothiazide) .Marland Kitchen.. 1 By Mouth Once Daily  Allergies (verified): No Known Drug Allergies  Past History:  Past medical, surgical, family and social histories (including risk factors) reviewed for relevance to current acute and chronic problems.  Past Medical History: Reviewed history from 01/27/2008 and no changes required. Asthma   - PFT's January 27, 2008 FEV 1 68 ->82%  after Beta2 Allergic rhinitis Hypertension Morbid Obesity  Past Surgical History: Reviewed history from 04/03/2007 and no changes required. 1995        Gallbladder removal 1996         Breast biopsy (-) x 2 1970         Appendectomy 1970         Ovarian cyst removal 2000         Hernia surgery - ventral 1995, 1996 2 foot surgeries 1975         Pilonidal cyst                  Skin CA removal  Family History: Reviewed history  from 04/03/2007 and no changes required. Father: Died 24's DM, HTN Mother: Alive 72 HTN, DM, CVA Siblings: 4 brothers hemophilia, DM, HTN 3 sisters hemophilia, DM, HTN No MI < 55 HBP: (+) DM:  (+) Breast CA, Mom  Social History: Reviewed history from 04/03/2007 and no changes required. Never Smoked Alcohol use-no Drug use-no Regular exercise-yes, walks at school Marital Status: Married x 21 years Children: 1 son in college, 51 26 years old with hemophilia Occupation: Runner, broadcasting/film/video, Music therapist, Language Arts Diet:  3 meals a day, large dinner, (+) fruits and veggies, (+) H2O  Review of Systems      See HPI  Physical Exam  General:  Well-developed,well-nourished,in no acute distress; alert,appropriate and cooperative throughout examination Lungs:  Normal respiratory effort, chest expands symmetrically. Lungs are clear to auscultation, no crackles or wheezes. Heart:  normal rate and no murmur.   Extremities:  +1 pitting edema L LE tr pitting edema RLE no calf tenderness Skin:  brusing both arms Psych:  Oriented X3 and normally interactive.     Impression & Recommendations:  Problem # 1:  EDEMA- LOCALIZED (ICD-782.3)  Her updated medication  list for this problem includes:    Hydrochlorothiazide 25 Mg Tabs (Hydrochlorothiazide) .Marland Kitchen... 1 by mouth once daily  Discussed elevation of the legs, use of compression stockings, sodium restiction, and medication use.   Problem # 2:  PETECHIAE (ICD-782.7) cbc done consider PT/PTT stop all aspirin and NSAids  Complete Medication List: 1)  Cartia Xt 300 Mg Xr24h-cap (Diltiazem hcl coated beads) .Marland Kitchen.. 1 tab by mouth daily 2)  Multivitamins Tabs (Multiple vitamin) .Marland Kitchen.. 1 by mouth once daily 3)  Calcium 1200-1000 Mg-unit Chew (Calcium carbonate-vit d-min) .Marland Kitchen.. 1 by mouth once daily 4)  Glucosamine-chondroitin 1500-1200 Mg/77ml Liqd (Glucosamine-chondroitin) .Marland Kitchen.. 1 by mouth once daily 5)  Hydrochlorothiazide 25 Mg Tabs (Hydrochlorothiazide) .Marland Kitchen..  1 by mouth once daily Prescriptions: HYDROCHLOROTHIAZIDE 25 MG TABS (HYDROCHLOROTHIAZIDE) 1 by mouth once daily  #30 x 0   Entered and Authorized by:   Loreen Freud DO   Signed by:   Loreen Freud DO on 08/26/2010   Method used:   Electronically to        [facility] [address] [id number] (retail)       [address]       [place], [place]  [address]       Ph: [phone] or [phone]       Fax: [phone]   RxID:   [id number]    Orders Added: 1)  Est. Patient Level III [36644]    Current Allergies (reviewed today): No known allergies  Appended Document: leg swelling/alc f/u pcp 2 weeks

## 2010-09-06 ENCOUNTER — Telehealth: Payer: Self-pay | Admitting: Internal Medicine

## 2010-09-06 NOTE — Telephone Encounter (Signed)
Spoke with patient.  She is going to take her medication in the mornings and I will arrange for the patient to be seen by Dr Ladona Ridgel

## 2010-09-06 NOTE — Telephone Encounter (Signed)
Pt states she having fluttering and sob. Pt wants to be seen today.

## 2010-09-07 ENCOUNTER — Telehealth: Payer: Self-pay | Admitting: Internal Medicine

## 2010-09-07 NOTE — Telephone Encounter (Signed)
Spoke with patient again  Her HR is not racing and feels regular.  She may be anxious.  Gave her an appointment for 09/12/10 at 2:45.  Pt is okay with this and will come in

## 2010-09-12 ENCOUNTER — Encounter: Payer: Self-pay | Admitting: Internal Medicine

## 2010-09-12 ENCOUNTER — Ambulatory Visit (INDEPENDENT_AMBULATORY_CARE_PROVIDER_SITE_OTHER): Payer: BC Managed Care – PPO | Admitting: Internal Medicine

## 2010-09-12 VITALS — BP 133/74 | HR 83 | Ht 68.0 in | Wt 339.4 lb

## 2010-09-12 DIAGNOSIS — R609 Edema, unspecified: Secondary | ICD-10-CM

## 2010-09-12 DIAGNOSIS — R233 Spontaneous ecchymoses: Secondary | ICD-10-CM

## 2010-09-12 DIAGNOSIS — I471 Supraventricular tachycardia: Secondary | ICD-10-CM

## 2010-09-12 DIAGNOSIS — R002 Palpitations: Secondary | ICD-10-CM | POA: Insufficient documentation

## 2010-09-12 DIAGNOSIS — I498 Other specified cardiac arrhythmias: Secondary | ICD-10-CM

## 2010-09-12 NOTE — Assessment & Plan Note (Signed)
The patient has had these for several months. We will check her blood count and platelet count today.

## 2010-09-12 NOTE — Progress Notes (Signed)
HPI Kimberly Patton returns today for followup. She is a pleasant, morbidly obese, middle-aged woman, with a history of SVT. Recently, she has had problems with peripheral edema. She has tried to lose weight and loss over 40 pounds although she has gained much of the back. The patient recently went to a chiropractor and had an alignment of her back. After this she began to experience palpitations. A like prior episodes of tachycardia. These episodes were characterized as being infrequent, feeling like there was something in her chest. In addition to this. She has had problems with easy bruisability and had a spontaneous hemorrhage in her left eye. She has not had trouble with her vision. She has not recently had episodes of prolonged tachypalpitations. She denies chest pain or shortness of breath. She notes that she recently was started on a diuretic because of peripheral edema. She thinks that her palpitations increased in frequency after the diuretic was begun. Not on File   Current Outpatient Prescriptions  Medication Sig Dispense Refill  . Calcium Carbonate-Vit D-Min (CALCIUM 1200) 1200-1000 MG-UNIT CHEW Chew 1 tablet by mouth daily.        Marland Kitchen diltiazem (CARDIZEM CD) 300 MG 24 hr capsule Take 300 mg by mouth daily.        . Glucosamine-Chondroitin 1500-1200 MG/30ML LIQD Take by mouth daily.        . hydrochlorothiazide 25 MG tablet Take 25 mg by mouth daily.        . Multiple Vitamin (MULTIVITAMIN) tablet Take 1 tablet by mouth daily.           Past Medical History  Diagnosis Date  . ASTHMA, PERSISTENT, MODERATE 04/03/2007  . ALLERGIC RHINITIS 04/03/2007  . Hypertension   . Morbid obesity     ROS:   All systems reviewed and negative except as noted in the HPI.   Past Surgical History  Procedure Date  . Cholecystectomy 1995  . Appendectomy 1996  . Ovarian cyst removal 1970  . Ventral hernia repair 2000  . Foot surgery 1995 / 1996    x2  . Pilonidal cyst excision 1975  . Skin cancer  excision      Family History  Problem Relation Age of Onset  . Diabetes    . Hypertension    . Stroke    . Hemophilia    . Breast cancer       History   Social History  . Marital Status: Married    Spouse Name: N/A    Number of Children: 2  . Years of Education: N/A   Occupational History  . teacher [employer]   Social History Main Topics  . Smoking status: Never Smoker   . Smokeless tobacco: Not on file  . Alcohol Use: No  . Drug Use: No  . Sexually Active: Not on file   Other Topics Concern  . Not on file   Social History Narrative  . No narrative on file     BP 133/74  Pulse 83  Ht 5\' 8"  (1.727 m)  Wt 339 lb 6.4 oz (153.951 kg)  BMI 51.61 kg/m2  Physical Exam:  Obese, well appearing NAD HEENT: Unremarkable Neck:  No JVD, no thyromegally Lymphatics:  No adenopathy Back:  No CVA tenderness Lungs:  Clear HEART:  Regular rate rhythm, no murmurs, no rubs, no clicks Abd:  Flat, positive bowel sounds, no organomegally, no rebound, no guarding Ext:  2 plus pulses, one plus non-pitting edema, no cyanosis, no clubbing Skin:  No rashes no nodules Neuro:  CN II through XII intact, motor grossly intact  EKG NSR. Reduced voltage through out the precordium.  Assess/Plan:

## 2010-09-12 NOTE — Assessment & Plan Note (Signed)
Etiology of the increase in her palpitations is unclear. Her symptoms do not sound like her prior episodes of SVT. They do not seem to be more prevalent since she began taking her diuretic. Today we will check a potassium level. If her level is low and we will plan to supplement her potassium to see if her palpitations improved. If her potassium level is not low then we will plan to have her wear a monitor to better understand the etiology of her palpitations.

## 2010-09-12 NOTE — Patient Instructions (Signed)
Your physician recommends that you return for lab work in: today  Will call you with lab results may have to get a monitor depending on labs

## 2010-09-12 NOTE — Assessment & Plan Note (Signed)
She has not had any documented sustained episodes of SVT in the last several months. She will continue her current medications.

## 2010-09-13 LAB — CBC WITH DIFFERENTIAL/PLATELET
Basophils Absolute: 0 10*3/uL (ref 0.0–0.1)
Basophils Relative: 0.2 % (ref 0.0–3.0)
Eosinophils Absolute: 0.1 10*3/uL (ref 0.0–0.7)
Eosinophils Relative: 1.1 % (ref 0.0–5.0)
HCT: 39 % (ref 36.0–46.0)
Hemoglobin: 13.3 g/dL (ref 12.0–15.0)
Lymphocytes Relative: 22 % (ref 12.0–46.0)
Lymphs Abs: 2.1 10*3/uL (ref 0.7–4.0)
MCHC: 34.2 g/dL (ref 30.0–36.0)
MCV: 85.6 fl (ref 78.0–100.0)
Monocytes Absolute: 0.4 10*3/uL (ref 0.1–1.0)
Monocytes Relative: 4.1 % (ref 3.0–12.0)
Neutro Abs: 6.8 10*3/uL (ref 1.4–7.7)
Neutrophils Relative %: 72.6 % (ref 43.0–77.0)
Platelets: 310 10*3/uL (ref 150.0–400.0)
RBC: 4.55 Mil/uL (ref 3.87–5.11)
RDW: 14.4 % (ref 11.5–14.6)
WBC: 9.4 10*3/uL (ref 4.5–10.5)

## 2010-09-13 LAB — BASIC METABOLIC PANEL
BUN: 16 mg/dL (ref 6–23)
CO2: 32 mEq/L (ref 19–32)
Calcium: 9.7 mg/dL (ref 8.4–10.5)
Chloride: 98 mEq/L (ref 96–112)
Creatinine, Ser: 0.5 mg/dL (ref 0.4–1.2)
GFR: 127.35 mL/min (ref >60.00)
Glucose, Bld: 76 mg/dL (ref 70–99)
Potassium: 3.5 mEq/L (ref 3.5–5.1)
Sodium: 141 mEq/L (ref 135–145)

## 2010-09-22 ENCOUNTER — Telehealth: Payer: Self-pay | Admitting: Cardiovascular Disease

## 2010-09-22 NOTE — Telephone Encounter (Signed)
Patient is aware of test/lab results.  

## 2010-09-22 NOTE — Telephone Encounter (Signed)
Pt returning your call

## 2010-09-25 ENCOUNTER — Other Ambulatory Visit: Payer: Self-pay | Admitting: Family Medicine

## 2010-09-25 NOTE — Telephone Encounter (Signed)
Will forward request to PCP    KP

## 2010-09-25 NOTE — Telephone Encounter (Signed)
Patient was seen 08/26/10 at [facility], Requesting a refill and she is not a patient of this office. Did you want me to forward to PCP or would you like to fill the RX  Please advise    KP

## 2010-09-25 NOTE — Telephone Encounter (Signed)
Her Dr needs to refill this---she will probably need to f/u with her pcp.

## 2010-09-25 NOTE — Telephone Encounter (Signed)
Her dr needs to do this

## 2010-10-24 NOTE — Assessment & Plan Note (Signed)
[facility]                         ELECTROPHYSIOLOGY OFFICE NOTE   Kimberly Patton, Kimberly Patton                       MRN:          [id number]  DATE:01/26/2008                            DOB:          June 16, 1955    Kimberly Patton returns today for followup.  She is a very pleasant 55-year-  old woman with a history of morbid obesity and hypertension as well as  underlying reactive airway disease who I initially met back in June when  she developed symptomatic SVT at 170 beats per minute, which was treated  with adenosine and calcium channel blockers.  The patient has been  stable and arrhythmia-free since then.  She does have problems with  reactive airway disease and has been seen Dr. Sherene Sires with this where her  lisinopril was discontinued.   CURRENT MEDICATIONS:  1. Benicar 20 a day.  2. Cartia XL 240 a day.  3. She is also on aspirin 81 a day.   PHYSICAL EXAMINATION:  GENERAL:  She is a pleasant and obese middle-aged  woman, in no distress.  VITAL SIGNS:  Blood pressure was 144/90, the pulse was 84 and regular,  and the respirations were 18.  Weight was not recorded, previously it  was over 320 pounds.  NECK:  No obvious jugular venous distention.  CARDIOVASCULAR:  A regular rate and rhythm with normal S1 and S2.  The  heart sounds were distant.  LUNGS:  Clear bilateral to auscultation.  No active wheezes, rhonchi, or  rales today.  ABDOMEN:  Obese, nontender, and nondistended.  EXTREMITIES:  Demonstrated trace peripheral edema bilaterally.  There  are no cyanosis or clubbing.   EKG demonstrates sinus rhythm.   IMPRESSION:  1. Paroxysmal supraventricular tachycardia.  2. Reactive airway disease.  3. Morbid obesity.   DISCUSSION:  Kimberly Patton is stable from arrhythmia perspective are  present.  I have recommended a period of watchful waiting and  continuation of her calcium channel blockers.  I have encouraged her to  lose weight.  We will see her  back in several months.     Doylene Canning. Ladona Ridgel, MD  Electronically Signed    GWT/MedQ  DD: 01/26/2008  DT: 01/27/2008  Job #: [id number]

## 2010-10-24 NOTE — Consult Note (Signed)
NAME:  Kimberly Patton, Kimberly Patton              ACCOUNT NO.:  [id number]   MEDICAL RECORD NO.:  [id number]          PATIENT TYPE:  INP   LOCATION:  2004                         FACILITY:  [facility]   PHYSICIAN:  Doylene Canning. Ladona Ridgel, MD    DATE OF BIRTH:  March 02, 1956   DATE OF CONSULTATION:  12/03/2007  DATE OF DISCHARGE:                                 CONSULTATION   Kimberly Patton is referred today for evaluation of SVT.   HISTORY OF PRESENT ILLNESS:  The patient is a very pleasant 55 year old  woman with a history of hypertension and obesity in the past.  She also  has underlying lung disease.  She is in her usual state of health until  a week ago when she presented for medical attention and at that time she  was experiencing symptoms of atypical chest discomfort and neck  discomfort and shortness of breath.  The patient subsequently was not  found to have any significant problems and was discharged after being  seen in the emergency department.  She subsequently presented after  experiencing the sensation that her heart was vibrating, chest was  vibrating, and was found to be in SVT at a rate of 170 beats per minute.  She was treated with adenosine and calcium channel blockers with  termination of her tachycardia.  The patient denies frank syncope.  She  denies any recent change in medications.   Current medications include lisinopril, Cardizem, Protonix, and  albuterol inhalers.   Family history is negative for premature coronary disease.   SOCIAL HISTORY:  The patient is married.  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   Her review of systems is notable for intermittent wheezing in the past  and dyspnea with exertion.  She has some mild arthritic complaints.  Otherwise all systems reviewed and negative except as noted in the HPI.   PHYSICAL EXAM:  GENERAL:  She is a pleasant, morbidly obese 55 year old  women in no acute distress.  VITAL SIGNS:  Blood pressure  today was 112/60, the pulse was 60 and  regular, respirations were 18, temperature 98.  HEENT:  Normocephalic and atraumatic.  Pupils are round.  Oropharynx is  moist.  Sclerae anicteric.  NECK:  Revealed no jugular distention.  There is no thyromegaly.  Trachea is midline.  Carotids are 2+ and symmetric.  LUNGS:  Clear bilaterally to auscultation.  No wheezes, rales or rhonchi  are present.  There is no increased workup breathing. CARDIOVASCULAR:  Revealed a regular, rate, and rhythm.  Normal S1 and S2.  Heart sounds  are distant.  PMI was not obviously enlarged or laterally displaced.  ABDOMEN:  Soft, nontender, nondistended.  There is no organomegaly.  Bowel sounds are present and rebound or guarding.  She is morbidly  obese.  EXTREMITIES:  Demonstrate no cyanosis, clubbing, or edema.  Pulses 2+  symmetric.  NEUROLOGIC:  Alert and oriented x3.  Cranial nerves intact.  Strength is  5/5 and symmetric.   Her EKG demonstrates sinus rhythm with normal axis and intervals.  There  is no pre-excitation.  EKG in her tachycardia demonstrates a short RP  tachycardia at 170 beats per minute.   IMPRESSION:  1. Symptomatic supraventricular tachycardia.  2. Underlying lung disease with chronic obstructive pulmonary disease      and asthma.  3. Hypertension.  4. Lower obesity, questionable.  5. Questionable sleep apnea.   DISCUSSION:  The patient's SVT is bothersome to her and quite  symptomatic, but she has not been on any other medical therapy for.  I  have recommended that since she also has hypertension that she start on  Cardizem and if need may be her lisinopril can be discontinued and she  can be maintained on the Cardizem.  Consideration for changing are her  albuterol to Xopenex would be another option.  If she has recurrent SVT,  then I have recommended catheter ablation though I think a trial of  medical therapy initially would be warranted.      Doylene Canning. Ladona Ridgel, MD   Electronically Signed     GWT/MEDQ  D:  12/03/2007  T:  12/04/2007  Job:  [id number]   cc:   Kerby Nora, MD  Karie Schwalbe, MD

## 2010-10-24 NOTE — H&P (Signed)
NAME:  Kimberly Patton, Kimberly Patton              ACCOUNT NO.:  [id number]   MEDICAL RECORD NO.:  [id number]          PATIENT TYPE:  INP   LOCATION:  3314                         FACILITY:  [facility]   PHYSICIAN:  Michiel Cowboy, MDDATE OF BIRTH:  02-Aug-1955   DATE OF ADMISSION:  12/03/2007  DATE OF DISCHARGE:                              HISTORY & PHYSICAL   PRIMARY CARE Calvin Chura:  Bevelyn Buckles. Bensimhon, M.D.   HISTORY OF THE PRESENT ILLNESS:  The patient is a 55 year old female  with past medical history of hypertension and asthma who was recently  admitted by [facility] for episodes of paroxysmal shortness of breath,  choking sensation in her throat and chest tightness, which was described  as coming and going.  At that time the patient was evaluated and her  symptoms had resolved.  The patient was admitted, had cardiac enzyme  cycle times three, had a CT scan  of her chest (non PE protocol) and  neck performed as well as an echocardiogram, all of which were pretty  much unremarkable; and, her symptoms were felt to be secondary to  musculoskeletal problems.  The patient was therefore discharged to home;  however, she continued to have the same symptoms.  She returned to the  emergency department and at that time she was actually able to be  monitored for the above symptoms; and, was noted to be in  supraventricular tachycardia with a heart rate up to the 160s.  The  patient received two doses of adenosine while in the emergency  department with brief conversion; however, she reverted back to  supraventricular tachycardia.  At that point a Cardizem drip was started  and the patient converted to sinus tachycardia.   Cardiology was made aware of the patient's arrival and they recommended  a formal cardiologic consult in the [facility] was called  to admit the patient for [place].   PAST MEDICAL HISTORY:  The past medical history is as noted above.   ALLERGIES:  No known drug  allergies.   MEDICATIONS:  Lisinopril 40 mg by mouth daily.   SOCIAL HISTORY:  The patient used to smoke a long time ago, but she has  not smoked since her teenage years.  She denies alcohol or drug abuse.  She denies tobacco use.  The patient works as a Runner, broadcasting/film/video.   FAMILY HISTORY:  The family history is significant for diabetes and  hypertension.   PHYSICAL EXAMINATION:  VITAL SIGNS:  Currently her heart rate is staying  in the 80s to 120s range.  Respirations 22.  Temperature 97.3.  Oxygen  saturation is 98% on room air.  GENERAL APPEARANCE:  The patient is a very obese female in no acute  distress while sitting down in bed.  LUNGS:  The lungs are clear to auscultation bilaterally.  HEART:  The heart is irregular; however, with no murmurs, rubs or  gallops.  EXTREMITIES:  The lower extremities are severely obese.  I am unable to  appreciate any swelling; and, per the patient they are not swollen.  ABDOMEN:  The abdomen is severely obese and  is difficult to examine.  NEUROLOGIC EXAMINATION:  The patient is neurologically intact.   LABORATORY DATA:  White blood cell count not obtained, but hemoglobin is  14.3.  Sodium 137, potassium 4.1 and creatinine 0.6.  The chest x-ray  shows mild vascular congestion and the initial EKG showed the SVT and  the rate of 160+.   ASSESSMENT AND PLAN:  This is a 55 year old female who presents with  newly diagnosed supraventricular tachycardia, from which she is  experiencing symptoms.   1. Supraventricular tachycardia.  We recommend official cardiology      consult in the morning.  The patient had a recent echocardiogram      and this will not be repeated at this time.  We will continue her      Cardizem drip; however, cardiology may consider surgery symptoms      persists.  We will also order a D-dimer, thyroid stimulating      hormone level and a B-natriuretic peptide.  We will cycle her      cardiac enzymes every 8 hours and every morning  electrocardiogram.  2. History of hypertension.  While she is on the Cardizem drip we will      hold her lisinopril and we will resume this if her blood pressure      increases.  3. Prophylaxes.  Lovenox and Protonix.  4. Questionable history of asthma.  We will give her as needed      albuterol.  5. We will admit to a stepdown unit where the patient's Cardizem can      be titrated if she continues to have runs of supraventricular      tachycardia, although short-lived.  As noted we will recommend      cardiology see the patient in the morning.      Michiel Cowboy, MD  Electronically Signed     AVD/MEDQ  D:  12/03/2007  T:  12/03/2007  Job:  [id number]   cc:   Bevelyn Buckles. Bensimhon, MD

## 2010-10-24 NOTE — Discharge Summary (Signed)
NAME:  Kimberly Patton, Kimberly Patton              ACCOUNT NO.:  [id number]   MEDICAL RECORD NO.:  [id number]          PATIENT TYPE:  INP   LOCATION:  2004                         FACILITY:  [facility]   PHYSICIAN:  Raenette Rover. Felicity Coyer, MDDATE OF BIRTH:  [birth year]   DATE OF ADMISSION:  12/03/2007  DATE OF DISCHARGE:  12/04/2007                               DISCHARGE SUMMARY   DISCHARGE DIAGNOSES:  1. Supraventricular tachycardia, asymptomatic.  2. Hypertension.   HISTORY OF PRESENT ILLNESS:  Kimberly Patton is a 55 year old white female  admitted on December 03, 2007, due to acute shortness of breath.  She  presented to the emergency department and was noted to be an SVT with  heart rates up into the 160s.  She was given 2 doses of adenosine in the  emergency department with brief conversion; however, she then reverted  back to SVT.  She was then placed on Cardizem drip and converted to  sinus tachycardia.   COURSE OF HOSPITALIZATION:  1. Supraventricular tachycardia.  The patient was placed on Cardizem      drip as noted above.  An EP consult was requested.  The patient was      seen by Dr. Lewayne Bunting.  She was started on oral Cardizem and has      had no further episodes of supraventricular tachycardia.  She has      been maintained normal sinus rhythm with stable rate on telemetry.      Her blood pressure is currently stable.  We have taken the patient      off of her lisinopril and place on Cardizem CD.  She will need      outpatient monitoring of blood pressure adjustment as needed.  Per      Dr. Lewayne Bunting, he felt that if her SVT recurs, she was then need      an EP study with likely radiofrequency ablation.  At this time, I      plan discharge the patient to home with outpatient followup with      Dr. Lewayne Bunting in 4-6 weeks.   MEDICATIONS:  At time of discharge:  1. Cardizem CD 240 mg p.o. daily.  2. Claritin 10 mg p.o. daily as needed.  3. Flexeril 5 mg every 8 hours as needed.  4.  Albuterol as needed.   DISPOSITION:  She will be discharged to home.   FOLLOW UP:  She is to follow up with Dr. Kerby Nora in 2 weeks and Dr.  Gilman Schmidt in 4-6 weeks.  She is instructed to return to the ER  should she develop palpitations, rapid heart rate, or shortness of  breath.   PERTINENT LABORATORY DATA:  At the time of discharge, TSH 2.027, A1c  5.5, and hemoglobin 14.3.  Greater than 30 minutes was spent on the  discharge planning.      Sandford Craze, NP      Raenette Rover. Felicity Coyer, MD  Electronically Signed    MO/MEDQ  D:  12/04/2007  T:  12/05/2007  Job:  [id number]   cc:   Doylene Canning. Ladona Ridgel, MD  Kerby Nora, MD

## 2010-10-24 NOTE — Discharge Summary (Signed)
NAME:  Kimberly Patton, Kimberly Patton              ACCOUNT NO.:  [id number]   MEDICAL RECORD NO.:  [id number]          PATIENT TYPE:  INP   LOCATION:  6734                         FACILITY:  [facility]   PHYSICIAN:  Bruce Rexene Edison. Swords, MD    DATE OF BIRTH:  02-29-1956   DATE OF ADMISSION:  11/25/2007  DATE OF DISCHARGE:  11/27/2007                               DISCHARGE SUMMARY   DISCHARGE DIAGNOSES:  1. Chest pain and dyspnea with unclear etiology.  2. Neck pain likely musculoskeletal.  3. History of asthma.  4. History of morbid obesity.   HISTORY OF PRESENT ILLNESS:  Kimberly Patton is a 55 year old white female  with past medical history of hypertension and morbid obesity, who  presented to the emergency room on day of admission with reports of  shortness of breath beginning on evening prior.  The patient reported  difficulty walking due to shortness of breath and sleeping in her  recliner on night prior to admission stating she felt like someone was  choking her.  The patient also with associated neck pain, tightness,  and chest tightness.  Symptoms were unrelieved by Mucinex taken at home.  The patient also with mild wheezing.  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  Also of importance, the patient with root canal 2  weeks ago with persistent pain in right jaw since procedure.  The  patient has been started on amoxicillin by her dentist for suspected  infection with no clear source of the patient's shortness of breath and  pain.  The patient was admitted for further evaluation and treatment.   PAST MEDICAL HISTORY:  1. Asthma.  2. Allergic rhinitis.  3. Hypertension.  4. Morbid obesity.   COURSE OF HOSPITALIZATION:  1. Chest pain with dyspnea.  The patient's pain resolved upon arrival      to ER and receiving oxygen and sublingual  nitroglycerin.  Cardiac      enzymes were cycled x3 which were within normal limits.  D-dimer      was negative.  The patient's ABG was within normal limits, normal      sed rate, and only mildly increased white blood cell count at 11.9.      CT of the chest and neck was obtained to rule out both pericardial      effusion and questionable abscess with recent dental procedure.      These studies both came back negative for any acute findings.      Chest x-ray revealed borderline cardiomegaly.  At the time of      admission, the patient was placed on empiric Zosyn due to mildly      elevated white count and with questionable infectious process      causing the patient's symptoms; however, antibiotics have since      been discontinued as no signs of infection on exam.  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  The patient without any shortness of breath      of chest pain greater than 24 hours at time of discharge.  Neck      pain is likely secondary to root canal and musculoskeletal in      nature, as the patient reports relief of pain with muscle relaxer.      The patient will be discharged home on continued Flexeril.   MEDICATIONS:  At the time of discharge,  1. Claritin 10 mg p.o. daily.  2. Lisinopril 40 mg p.o. daily.  3. Flexeril 5 mg p.o. t.i.d. p.r.n.  4. Albuterol MDI 1-2 puffs q.4 h. p.r.n. shortness of breath.   LABORATORY DATA:  Pertinent lab work during hospitalization, white count  8.5, platelet count 225, hemoglobin 11.3, hematocrit 31.9, sodium 138,  potassium 4.1, BUN 14, creatinine 0.42, BNP 39.0, D-dimer 0.29, sed rate  12.  Cardiac enzymes negative x3.  2-D echo revealing normal LVEF at 60%  with mild increased left ventricular wall thickness.  No pericardial  effusion.  Echocardiogram study was limited secondary to the patient's  obesity.   DISPOSITION:  The patient felt medically stable for  discharge home as  her symptoms have resolved shortly after admission.  The idea of  outpatient sleep study in setting of the patient's habitus and medical  issues has been raised.  We will defer this decision the patient's  primary care physician at follow-up appointment.  The patient is  scheduled to see her primary care physician, Dr. Kerby Nora on December 09, 2007 at 9:30 a.m.      Cordelia Pen, NP      Valetta Mole. Swords, MD  Electronically Signed    LE/MEDQ  D:  11/27/2007  T:  11/28/2007  Job:  [id number]   cc:   Kerby Nora, MD

## 2010-10-27 NOTE — Assessment & Plan Note (Signed)
[facility]                                 ON-CALL NOTE   Kimberly Patton, Kimberly Patton                       MRN:          [id number]  DATE:07/28/2006                            DOB:          14-Apr-1956    PHONE NUMBER:  [phone]   PRIMARY CARE PHYSICIAN:  Kerby Nora, MD.   SUBJECTIVE:  Patient has history of acute bronchitis off and on and  feels that she is getting bronchitis again.  She has cough and  congestion.  She is unable to sleep because of the cough.   ASSESSMENT/PLAN:  The patient needs evaluation by physician, if no  better in 4 to 5 days.  In the meantime, she is will use guaifenesin  decongestant and nasal saline.  A prescription for codeine with  guaifenesin was called in to [facility] at [address], quantity 6 ounces, 0  refills.     Kerby Nora, MD  Electronically Signed    AB/MedQ  DD: 07/28/2006  DT: 07/28/2006  Job #: [id number]

## 2010-10-27 NOTE — Assessment & Plan Note (Signed)
[facility]                             [facility] OFFICE NOTE   Kimberly Patton, Kimberly Patton                       MRN:          [id number]  DATE:04/04/2006                            DOB:          1955/10/05    NEW PATIENT HISTORY AND PHYSICAL:   CHIEF COMPLAINT:  [age]-year-old white female here to establish new doctor.   HISTORY OF PRESENT ILLNESS:  Kimberly Patton recently moved from [place] in July  of 2007. She was previously seeing a doctor down there for elevated blood  pressure and what sounds like asthma although she refers to it as frequent  problems with bronchitis.  1. Elevated blood pressure: the patient does have an elevated blood      pressure today. She was previously diagnosed with hypertension in March      of this year and was placed on Lisinopril which was gradually increased      to 40 mg daily. After reaching the maximum dose her blood pressure was      well controlled.  Since she moved she has not been measuring her blood      pressure. She denies any headache, chest pain, dizziness or side      effects from her Lisinopril.  2. Acute bronchitis: she states she began having a head cold several weeks      ago which began with congestion and then progressed to frequent dry      cough and then finally into chest tightness. She reports that she      wheezes. She tries to use albuterol as little as possible because it      makes her shaky but she has used it two times in the past 2 weeks. She      states that she has a history of 8-10 bronchitis attacks per year. She      states that she coughs every night when lying down, even when she is      not sick. She denies any past hospitalizations or intubations. She is      not sure if she has had a lung function test in the past. She has never      been on a controller medication.   PAST MEDICAL HISTORY:  1. Hypertension.  2. Asthma.  3. Allergic rhinitis.  4. Uterine fibroids.   HOSPITALIZATIONS/SURGERIES/PROCEDURES:  1995 Gallbladder removal.  1996 Breast biopsy, negative x2.  1970 Appendectomy.  1970 Ovarian cyst removal.  2000 Hernia surgery.  1995 and 1996 for surgeries.  1975 Pilonidal cyst removal.  Skin cancer removal.   Mammogram negative in 2006.  Last Pap smear negative in 2006.   ALLERGIES:  None.   MEDICATIONS:  1. Lisinopril 40 mg daily.  2. Kimberly Patton daily.  3. Albuterol MDI 2 puffs q.4 hours p.r.n. wheeze.   SOCIAL HISTORY:  No tobacco use history. No alcohol history. No drug use.  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 She has been  married for 21 years and denies domestic abuse. She has two children, one  son is 40 years old and has  hemophilia. The other son is 71 years old and in  college.  She does not get any regular exercise other than walking up and down the  stairs at school. She has three meals a day, small meals earlier in the day  and one large dinner. She drinks water frequently. She does have fruit and  vegetables. She used to be on a diet recommended by a Kimberly Patton. She  states that she lost 70 pounds but has gone off the diet and is considering  going back on this diet.   FAMILY HISTORY:  Father deceased at age 61 with diabetes, hypertension.  Mother alive at age 90 with hypertension, diabetes and stroke. No family  history of MI before age 26. She has four brothers and three sisters. One  brother died from hemophilia. There are several siblings who have diabetes  and hypertension.  Her Mom has breast cancer and there is no other family history of cancer.   PHYSICAL EXAMINATION:  VITAL SIGNS: Height 66-1/5 inches, weight 336, making  BMI greater than 40. Blood pressure 142/102, pulse 96, temperature 98.1.  GENERAL:  Morbidly obese female in no apparent distress.  HEENT: PERRLA. Extraocular muscles intact. Oropharynx clear. Tympanic  membranes clear. Nares clear.  NECK: No thyromegaly, no  lymphadenopathy.  CARDIOVASCULAR: Regular rate and rhythm, no murmurs, rubs, or gallops.  Normal PMI.  Peripheral pulses 2+, no peripheral edema.  PULMONARY:  Decreased breath movements throughout. Prolonged expiratory  phase, no wheezes, rales or rhonchi.  ABDOMEN: Old hernia mesh in place. Nontender. Normal active bowel sounds, no  hepatosplenomegaly.  MUSCULOSKELETAL:  Strength 5/5 in upper and lower extremities.  NEURO:  Alert and oriented x3. Cranial nerves II-XII grossly intact.   PROCEDURE:  Peak flow obtained, maximum was 250. Her goal peak flow is 344  to 430.   ASSESSMENT AND PLAN:  PROBLEM #1. Hypertension, poor control: This may be  elevated just secondary to her being seen by a new doctor and rushing here.  She will continue to take Lisinopril 40 mg daily. She will monitor her blood  pressure at home and will let me know if it is consistently above 140/90.   PROBLEM #2. Asthma, moderate persistent: Her clinical history sounds like  she has asthma with frequent exacerbations. She also has daily night time  symptoms. She would benefit from a long term inhaled steroid and long acting  beta agonist. She was given a prescription and a sample today of Symbicort  160/4.5 mcg two puffs inhaled twice daily.  I will also treat her acute  exacerbation with a Z-Pak as well as a course of prednisone. She will take  prednisone 60 mg x3 days, 40 mg x2 days and then 20 mg x2 days. She will use  albuterol p.r.n.   PROBLEM #3. Prevention: She is due for her mammogram, Pap smear, Hemoccult.  She is up to date with vaccines. She very likely will possibly need  pulmonary function tests given problem #2. She will return to clinic in  December when she is due for the above health maintenance.     Kimberly Nora, MD    AB/MedQ  DD: 04/04/2006  DT: 04/05/2006  Job #: [id number]

## 2011-03-08 LAB — BASIC METABOLIC PANEL
BUN: 14
BUN: 16
BUN: 17
CO2: 27
CO2: 29
CO2: 30
Calcium: 8.6
Calcium: 8.8
Calcium: 9.1
Chloride: 103
Chloride: 103
Chloride: 103
Creatinine, Ser: 0.42
Creatinine, Ser: 0.53
Creatinine, Ser: 0.68
GFR calc Af Amer: >60
GFR calc Af Amer: >60
GFR calc Af Amer: >60
GFR calc non Af Amer: >60
GFR calc non Af Amer: >60
GFR calc non Af Amer: >60
Glucose, Bld: 100 — ABNORMAL HIGH
Glucose, Bld: 87
Glucose, Bld: 87
Potassium: 3.3 — ABNORMAL LOW
Potassium: 3.4 — ABNORMAL LOW
Potassium: 4.1
Sodium: 138
Sodium: 139
Sodium: 141

## 2011-03-08 LAB — CBC
HCT: 31.9 — ABNORMAL LOW
HCT: 32.8 — ABNORMAL LOW
HCT: 37.6
Hemoglobin: 11.3 — ABNORMAL LOW
Hemoglobin: 11.4 — ABNORMAL LOW
Hemoglobin: 12.9
MCHC: 34.3
MCHC: 34.6
MCHC: 35.2
MCV: 81.7
MCV: 82.1
MCV: 82.6
Platelets: 225
Platelets: 265
Platelets: 265
RBC: 3.91
RBC: 3.98
RBC: 4.58
RDW: 14.2
RDW: 14.5
RDW: 15
WBC: 11.9 — ABNORMAL HIGH
WBC: 8.2
WBC: 8.5

## 2011-03-08 LAB — CARDIAC PANEL(CRET KIN+CKTOT+MB+TROPI)
CK, MB: 1.1
CK, MB: 1.2
Relative Index: INVALID
Relative Index: INVALID
Total CK: 41
Total CK: 46
Troponin I: 0.01
Troponin I: 0.02

## 2011-03-08 LAB — COMPREHENSIVE METABOLIC PANEL
ALT: 21
AST: 15
Albumin: 3.5
Alkaline Phosphatase: 67
BUN: 13
CO2: 26
Calcium: 8.9
Chloride: 103
Creatinine, Ser: 0.52
GFR calc Af Amer: >60
GFR calc non Af Amer: >60
Glucose, Bld: 110 — ABNORMAL HIGH
Potassium: 4.3
Sodium: 140
Total Bilirubin: 0.7
Total Protein: 6.4

## 2011-03-08 LAB — DIFFERENTIAL
Basophils Absolute: 0.1
Basophils Relative: 1
Eosinophils Absolute: 0.1
Eosinophils Relative: 1
Lymphocytes Relative: 22
Lymphs Abs: 2.6
Monocytes Absolute: 0.5
Monocytes Relative: 5
Neutro Abs: 8.5 — ABNORMAL HIGH
Neutrophils Relative %: 72

## 2011-03-08 LAB — POCT I-STAT, CHEM 8
BUN: 15
BUN: 20
Calcium, Ion: 1.09 — ABNORMAL LOW
Calcium, Ion: 1.14
Chloride: 102
Chloride: 104
Creatinine, Ser: 0.6
Creatinine, Ser: 0.7
Glucose, Bld: 100 — ABNORMAL HIGH
Glucose, Bld: 117 — ABNORMAL HIGH
HCT: 33 — ABNORMAL LOW
HCT: 42
Hemoglobin: 11.2 — ABNORMAL LOW
Hemoglobin: 14.3
Potassium: 3.3 — ABNORMAL LOW
Potassium: 4.1
Sodium: 137
Sodium: 139
TCO2: 23
TCO2: 27

## 2011-03-08 LAB — CULTURE, BLOOD (ROUTINE X 2)
Culture: NO GROWTH
Culture: NO GROWTH

## 2011-03-08 LAB — CK TOTAL AND CKMB (NOT AT ARMC)
CK, MB: 0.6
CK, MB: 0.6
CK, MB: 0.7
Relative Index: INVALID
Relative Index: INVALID
Relative Index: INVALID
Total CK: 25
Total CK: 27
Total CK: 29

## 2011-03-08 LAB — D-DIMER, QUANTITATIVE: D-Dimer, Quant: 0.29

## 2011-03-08 LAB — B-NATRIURETIC PEPTIDE (CONVERTED LAB)
Pro B Natriuretic peptide (BNP): 108 — ABNORMAL HIGH
Pro B Natriuretic peptide (BNP): 39

## 2011-03-08 LAB — POCT CARDIAC MARKERS
CKMB, poc: 1.2
CKMB, poc: <1 — ABNORMAL LOW
CKMB, poc: <1 — ABNORMAL LOW
Myoglobin, poc: 33.2
Myoglobin, poc: 37.1
Myoglobin, poc: 40.8
Operator id: 272551
Operator id: 272551
Operator id: 272551
Troponin i, poc: <0.05
Troponin i, poc: <0.05
Troponin i, poc: <0.05

## 2011-03-08 LAB — TROPONIN I
Troponin I: 0.01
Troponin I: <0.01
Troponin I: <0.01

## 2011-03-08 LAB — SEDIMENTATION RATE: Sed Rate: 12

## 2011-03-08 LAB — APTT
aPTT: 31
aPTT: 34

## 2011-03-08 LAB — PROTIME-INR
INR: 1
INR: 1
Prothrombin Time: 13
Prothrombin Time: 13.1

## 2011-03-08 LAB — TSH: TSH: 2.027

## 2011-03-08 LAB — D-DIMER, QUANTITATIVE (NOT AT ARMC): D-Dimer, Quant: 0.34

## 2011-03-08 LAB — HEMOGLOBIN A1C
Hgb A1c MFr Bld: 5.5
Mean Plasma Glucose: 119

## 2011-05-06 ENCOUNTER — Other Ambulatory Visit: Payer: Self-pay | Admitting: Family Medicine

## 2011-05-11 ENCOUNTER — Other Ambulatory Visit: Payer: Self-pay | Admitting: *Deleted

## 2011-05-11 MED ORDER — DILTIAZEM HCL ER COATED BEADS 300 MG PO CP24: 300.0000 mg | ORAL_CAPSULE | Freq: Every day | ORAL | Status: DC

## 2011-06-07 ENCOUNTER — Telehealth: Payer: Self-pay | Admitting: Internal Medicine

## 2011-06-07 DIAGNOSIS — R609 Edema, unspecified: Secondary | ICD-10-CM

## 2011-06-07 DIAGNOSIS — I8390 Asymptomatic varicose veins of unspecified lower extremity: Secondary | ICD-10-CM

## 2011-06-07 NOTE — Telephone Encounter (Signed)
Patient called and stated that the recommendation of [facility] is leaving the practice so she called [facility] in [place]  But she needs a referral to [facility] and wanted to know if we could give her one for her Varicose vein and swelling on her left leg.

## 2011-06-08 ENCOUNTER — Other Ambulatory Visit: Payer: Self-pay

## 2011-06-08 DIAGNOSIS — I83893 Varicose veins of bilateral lower extremities with other complications: Secondary | ICD-10-CM

## 2011-06-11 ENCOUNTER — Ambulatory Visit (INDEPENDENT_AMBULATORY_CARE_PROVIDER_SITE_OTHER): Payer: BC Managed Care – PPO | Admitting: Vascular Surgery

## 2011-06-11 DIAGNOSIS — R609 Edema, unspecified: Secondary | ICD-10-CM

## 2011-06-11 DIAGNOSIS — I83893 Varicose veins of bilateral lower extremities with other complications: Secondary | ICD-10-CM

## 2011-06-11 NOTE — Progress Notes (Signed)
Bilateral LE venous Duplex for Reflux performed 06/11/2011 @ [facility]

## 2011-06-15 ENCOUNTER — Encounter: Payer: Self-pay | Admitting: Vascular Surgery

## 2011-06-18 ENCOUNTER — Encounter: Payer: Self-pay | Admitting: Vascular Surgery

## 2011-06-18 ENCOUNTER — Ambulatory Visit (INDEPENDENT_AMBULATORY_CARE_PROVIDER_SITE_OTHER): Payer: BC Managed Care – PPO | Admitting: Vascular Surgery

## 2011-06-18 VITALS — BP 145/84 | HR 86 | Resp 24 | Ht 68.0 in | Wt 308.0 lb

## 2011-06-18 DIAGNOSIS — I83893 Varicose veins of bilateral lower extremities with other complications: Secondary | ICD-10-CM

## 2011-06-18 NOTE — Progress Notes (Signed)
Subjective:     Patient ID: Kimberly Patton, female   DOB: Jul 07, 1955, 56 y.o.   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 She has noted darkening of the skin particularly in the left ankle area. She has no history of stasis ulcers bleeding or other complications such as DVT or thrombophlebitis. She does have aching throbbing and burning discomfort particularly as the day progresses in the medial calf and thigh area left worse than right. She does not wear elastic compression stockings. She is unable to elevate her legs during the day because of her school teaching position. Symptoms are concerning her because of the skin changes and the ongoing pain  Past Medical History  Diagnosis Date  . ASTHMA, PERSISTENT, MODERATE 04/03/2007  . ALLERGIC RHINITIS 04/03/2007  . Hypertension   . Morbid obesity     History  Substance Use Topics  . Smoking status: Never Smoker   . Smokeless tobacco: Not on file  . Alcohol Use: No    Family History  Problem Relation Age of Onset  . Diabetes    . Hypertension    . Stroke    . Hemophilia    . Breast cancer      No Known Allergies  Current outpatient prescriptions:Calcium Carbonate-Vit D-Min (CALCIUM 1200) 1200-1000 MG-UNIT CHEW, Chew 1 tablet by mouth daily.  , Disp: , Rfl: ;  diltiazem (CARDIZEM CD) 300 MG 24 hr capsule, Take 1 capsule (300 mg total) by mouth daily., Disp: 30 capsule, Rfl: 3;  Glucosamine-Chondroitin 1500-1200 MG/30ML LIQD, Take by mouth daily.  , Disp: , Rfl: ;  hydrochlorothiazide 25 MG tablet, TAKE 1 TABLET BY MOUTH EVERY DAY, Disp: 30 tablet, Rfl: 11 Multiple Vitamin (MULTIVITAMIN) tablet, Take 1 tablet by mouth daily.  , Disp: , Rfl:   BP 145/84  Pulse 86  Resp 24  Ht 5\' 8"  (1.727 m)  Wt 308 lb (139.708 kg)  BMI 46.83 kg/m2  Body mass index is 46.83 kg/(m^2).             Review of Systems she complains of occasional chest  discomfort associated with the aid atrial arrhythmia, dyspnea on exertion, weakness in the arms and legs, numbness in the arms and legs, and a ventral hernia which has recurred following mesh repair 10 years ago. She has lost 50 pounds and continues to attempt ongoing weight loss.    Objective:   Physical Exam Blood pressure 145 or 84 heart rate 86 respirations 24 Generally she is an obese middle-aged female in no apparent distress alert and oriented x3 Lungs no rhonchi or wheezing Cardiovascular regular and no murmurs carotid pulses 3+ no audible bruits HEENT normal for age Abdomen obese with a large ventral hernia or previous mesh repair has been performed Skin free of rashes Neurologic normal Musculoskeletal free major deformities Lower extremity exam reveals significant hyperpigmentation and scaliness of the skin in the left leg in the lower third with similar findings on the right side to a lesser degree. There bulging varicosities in the left leg below the knee and near the ankle medially over the great saphenous system. There are no active ulcerations. The right leg has bulging varicosities in the medial calf over the great saphenous system.  Patient had a venous reflux exam performed in our office on 06/11/2011 which I have reviewed and interpreted. She has gross reflux in both great saphenous system from the knee to the saphenofemoral  junction with no DVT. or significant deep system reflux    Assessment:     Severe bilateral venous insufficiency with hyperpigmentation and swelling both lower extremities secondary to gross reflux bilateral great saphenous systems    Plan:     #1 long-leg elastic compression stockings 20-30 mm gradient #2 elevation of legs as much as possible although her job will not allow this during the day #3 ibuprofen on regular basis for pain #4 return to see me in 3 months. If there has not been dramatic improvement in her skin changes I think we should  proceed with #1 laser ablation left great saphenous vein with 10-20 stab phlebectomy of secondary varicosities followed by #2 laser ablation right great saphenous vein.return in 3 month

## 2011-06-19 NOTE — Procedures (Unsigned)
LOWER EXTREMITY VENOUS REFLUX EXAM  INDICATION:  Lower extremity edema, varicose veins.  EXAM:  Using color-flow imaging and pulse Doppler spectral analysis, the bilateral common femoral, superficial femoral, popliteal, posterior tibial, greater and lesser saphenous veins are evaluated.  There is evidence suggesting deep venous insufficiency in the left lower extremity. There is no evidence suggesting deep venous thrombosis in the right lower extremity.  The bilateral saphenofemoral junctions are not competent with reflux of >500 milliseconds. The bilateral GSV's are not competent with Reflux of >500 milliseconds with the caliber as described below.   The bilateral proximal short saphenous veins demonstrate incompetency.  GSV Diameter (used if found to be incompetent only)                                           Right    Left Proximal Greater Saphenous Vein           0.63 cm  0.87 cm Proximal-to-mid-thigh                     0.40 cm  0.47 cm Mid thigh                                 0.53 cm  0.70 cm Mid-distal thigh                          cm       cm Distal thigh                              0.54 cm  0.60 cm Knee                                      0.54 cm  0.68 cm  IMPRESSION: 1. The bilateral great saphenous veins are not competent with reflux     >500 milliseconds. 2. The bilateral great saphenous veins are not tortuous to the level     of the distal thigh. 3. There is a short segment at the anterior branch of the left GSV     that is not competent with a diameter of 0.59 cm at the proximal     thigh noted. 4. The deep venous system is not competent with reflux of >500     milliseconds on the left lower extremity; however, the right lower     extremity is competent. 5. The bilateral small saphenous veins are competent.     ___________________________________________ Quita Skye. Hart Rochester, M.D.  SH/MEDQ  D:  06/11/2011  T:  06/11/2011  Job:  [id number]

## 2011-08-02 ENCOUNTER — Telehealth: Payer: Self-pay | Admitting: *Deleted

## 2011-08-02 NOTE — Telephone Encounter (Signed)
Returned Kimberly Patton's phone call regarding worsening of her leg symptoms.  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  She states that at the end of the day her legs are hurting and her ankles are swollen.  Encouraged her to continue wearing thigh high compression stockings ,elevate feet as often as possible and to take Ibuprofen 600 mg po TID.  Also encouraged her to use ice compresses if she needed added pain relief.  Discussed with Kimberly Patton that the 3 month period of conservative treatment is necessary if she wants her insurance company to approve the venous procedures as medically necessary. Kimberly Patton verbalized understanding.  Geovanie Winnett, Neena Rhymes

## 2011-08-31 ENCOUNTER — Other Ambulatory Visit: Payer: Self-pay | Admitting: *Deleted

## 2011-08-31 MED ORDER — DILTIAZEM HCL ER COATED BEADS 300 MG PO CP24: 300.0000 mg | ORAL_CAPSULE | Freq: Every day | ORAL | Status: DC

## 2011-09-10 ENCOUNTER — Other Ambulatory Visit: Payer: Self-pay | Admitting: *Deleted

## 2011-09-14 ENCOUNTER — Encounter: Payer: Self-pay | Admitting: Vascular Surgery

## 2011-09-17 ENCOUNTER — Ambulatory Visit (INDEPENDENT_AMBULATORY_CARE_PROVIDER_SITE_OTHER): Payer: BC Managed Care – PPO | Admitting: Vascular Surgery

## 2011-09-17 ENCOUNTER — Encounter: Payer: Self-pay | Admitting: Vascular Surgery

## 2011-09-17 VITALS — BP 133/91 | HR 92 | Resp 20 | Ht 68.0 in | Wt 318.0 lb

## 2011-09-17 DIAGNOSIS — I83893 Varicose veins of bilateral lower extremities with other complications: Secondary | ICD-10-CM

## 2011-09-17 NOTE — Progress Notes (Signed)
Subjective:     Patient ID: Kimberly Patton, female   DOB: 1956-01-26, 56 y.o.   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 She has been wearing long leg elastic compression stockings 20-30 mm gradient and trying elevation and ibuprofen as her schedule permits but continues to have chronic swelling aching throbbing and burning discomfort in both legs left worse than right. 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 She has no history of DVT or thrombophlebitis. Her symptoms continued to be progressive.  Past Medical History  Diagnosis Date  . ASTHMA, PERSISTENT, MODERATE 04/03/2007  . ALLERGIC RHINITIS 04/03/2007  . Hypertension   . Morbid obesity     History  Substance Use Topics  . Smoking status: Never Smoker   . Smokeless tobacco: Never Used  . Alcohol Use: No    Family History  Problem Relation Age of Onset  . Diabetes    . Hypertension    . Stroke    . Hemophilia    . Breast cancer    . Cancer Mother   . Diabetes Mother   . Hypertension Mother   . Diabetes Father   . Hypertension Father     No Known Allergies  Current outpatient prescriptions:Calcium Carbonate-Vit D-Min (CALCIUM 1200) 1200-1000 MG-UNIT CHEW, Chew 1 tablet by mouth daily.  , Disp: , Rfl: ;  diltiazem (CARDIZEM CD) 300 MG 24 hr capsule, Take 1 capsule (300 mg total) by mouth daily., Disp: 30 capsule, Rfl: 0;  Glucosamine-Chondroitin 1500-1200 MG/30ML LIQD, Take by mouth daily.  , Disp: , Rfl: ;  hydrochlorothiazide 25 MG tablet, TAKE 1 TABLET BY MOUTH EVERY DAY, Disp: 30 tablet, Rfl: 11 Multiple Vitamin (MULTIVITAMIN) tablet, Take 1 tablet by mouth daily.  , Disp: , Rfl: ;  naproxen sodium (ANAPROX) 220 MG tablet, Take 220 mg by mouth 2 (two) times daily with a meal., Disp: , Rfl:   BP 133/91  Pulse 92  Resp 20  Ht 5\' 8"  (1.727 m)  Wt 318 lb (144.244 kg)  BMI 48.35 kg/m2  Body  mass index is 48.35 kg/(m^2).          Review of Systems     Objective:   Physical Exam blood pressure 133/91 heart rate 92 respirations 20 General she is an obese female who is in no apparent stress alert and oriented x3 Lower extremity exam reveals bulging varicosities in the left anterior thigh extending down below the knee into the medial calf 1+ edema and mild hyperpigmentation. Right leg reveals prominent superficial veins with chronic 1+ edema distally in early hyperpigmentation in the lower third of the leg. Both legs have 3+ dorsalis pedis pulses palpable.  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

## 2011-09-19 ENCOUNTER — Other Ambulatory Visit: Payer: Self-pay | Admitting: *Deleted

## 2011-09-19 DIAGNOSIS — I83893 Varicose veins of bilateral lower extremities with other complications: Secondary | ICD-10-CM

## 2011-09-24 ENCOUNTER — Ambulatory Visit: Payer: BC Managed Care – PPO | Admitting: Vascular Surgery

## 2011-10-01 ENCOUNTER — Encounter: Payer: Self-pay | Admitting: Vascular Surgery

## 2011-10-01 ENCOUNTER — Ambulatory Visit (INDEPENDENT_AMBULATORY_CARE_PROVIDER_SITE_OTHER): Payer: BC Managed Care – PPO | Admitting: Vascular Surgery

## 2011-10-01 VITALS — BP 131/83 | HR 70 | Resp 18 | Ht 68.0 in | Wt 308.0 lb

## 2011-10-01 DIAGNOSIS — I83893 Varicose veins of bilateral lower extremities with other complications: Secondary | ICD-10-CM

## 2011-10-01 NOTE — Progress Notes (Signed)
Subjective:     Patient ID: Kimberly Patton, female   DOB: 1956-06-05, 56 y.o.   MRN: [id number]  HPI this 56 year old female had laser ablation of the left great saphenous line from the knee to the saphenofemoral junction and 10-20 stab phlebectomy performed under local tumescent anesthesia. She tolerated the procedure well. A total of 1985 J of energy was utilized.   Review of Systems     Objective:   Physical ExamBP 131/83  Pulse 70  Resp 18  Ht 5\' 8"  (1.727 m)  Wt 308 lb (139.708 kg)  BMI 46.83 kg/m2     Assessment:     Well-tolerated laser blush and left great saphenous line with 10-20 stab phlebectomy for painful varicosities and venous hypertension    Plan:     Return in one week for venous duplex exam left leg to confirm closure left great saphenous vein We'll then proceed with similar procedure on the contralateral right leg

## 2011-10-01 NOTE — Progress Notes (Signed)
Laser Ablation Procedure      Date: 10/01/2011    Kimberly Patton DOB:31-Dec-1955  Consent signed: Yes  Surgeon:J.D. Hart Rochester  Procedure: Laser Ablation: left Greater Saphenous Vein  BP 131/83  Pulse 70  Resp 18  Ht 5\' 8"  (1.727 m)  Wt 308 lb (139.708 kg)  BMI 46.83 kg/m2  Start time: 3:00   End time: 4:10  Tumescent Anesthesia: 450 cc 0.9% NaCl with 50 cc Lidocaine HCL with 1% Epi and 15 cc 8.4% NaHCO3  Local Anesthesia: 11 cc Lidocaine HCL and NaHCO3 (ratio 2:1)  Pulsed mode: Watts 15 Seconds 1 Pulses:1 Total Pulses:132 Total Energy: 1980 Total Time: 2:12    Stab Phlebectomy: 10-20 Sites: Thigh and Calf  Patient tolerated procedure well: Yes  Notes:   Description of Procedure:  After marking the course of the saphenous vein and the secondary varicosities in the standing position, the patient was placed on the operating table in the supine position, and the left leg was prepped and draped in sterile fashion. Local anesthetic was administered, and under ultrasound guidance the saphenous vein was accessed with a micro needle and guide wire; then the micro puncture sheath was placed. A guide wire was inserted to the saphenofemoral junction, followed by a 5 french sheath.  The position of the sheath and then the laser fiber below the junction was confirmed using the ultrasound and visualization of the aiming beam.  Tumescent anesthesia was administered along the course of the saphenous vein using ultrasound guidance. Protective laser glasses were placed on the patient, and the laser was fired at 15 watt pulsed mode advancing 1-2 mm per sec.  For a total of 1980 joules.  A steri strip was applied to the puncture site.  The patient was then put into Trendelenburg position.  Local anesthetic was utilized overlying the marked varicosities.  Greater than 10-20 stab wounds were made using the tip of an 11 blade; and using the vein hook,  The phlebectomies were performed using a hemostat to  avulse these varicosities.  Adequate hemostasis was achieved, and steri strips were applied to the stab wound.     ABD pads and thigh high compression stockings were applied.  Ace wrap bandages were applied over the phlebectomy sites and at the top of the saphenofemoral junction.  Blood loss was less than 15 cc.  The patient ambulated out of the operating room having tolerated the procedure well.

## 2011-10-02 ENCOUNTER — Encounter: Payer: Self-pay | Admitting: Vascular Surgery

## 2011-10-02 ENCOUNTER — Telehealth: Payer: Self-pay | Admitting: *Deleted

## 2011-10-02 NOTE — Telephone Encounter (Signed)
Reached patient at home. Having very little pain and no bleeding. Following all instructions. Reminded her of her fu appt.

## 2011-10-05 ENCOUNTER — Encounter: Payer: Self-pay | Admitting: Vascular Surgery

## 2011-10-06 ENCOUNTER — Other Ambulatory Visit: Payer: Self-pay | Admitting: Family Medicine

## 2011-10-08 ENCOUNTER — Encounter (INDEPENDENT_AMBULATORY_CARE_PROVIDER_SITE_OTHER): Payer: BC Managed Care – PPO | Admitting: *Deleted

## 2011-10-08 ENCOUNTER — Ambulatory Visit (INDEPENDENT_AMBULATORY_CARE_PROVIDER_SITE_OTHER): Payer: BC Managed Care – PPO | Admitting: Vascular Surgery

## 2011-10-08 ENCOUNTER — Encounter: Payer: Self-pay | Admitting: Vascular Surgery

## 2011-10-08 VITALS — BP 150/94 | HR 82 | Resp 20 | Ht 68.0 in | Wt 323.0 lb

## 2011-10-08 DIAGNOSIS — I83893 Varicose veins of bilateral lower extremities with other complications: Secondary | ICD-10-CM

## 2011-10-08 NOTE — Telephone Encounter (Signed)
Patient not seen for cpx since 2011 and has been advised to make appt before further refills before please advise.

## 2011-10-08 NOTE — Progress Notes (Signed)
Subjective:     Patient ID: Kimberly Patton, female   DOB: Jul 16, 1955, 56 y.o.   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 She has had some moderate discomfort along the course of the great saphenous vein from the mid thigh to the inguinal area. She has had no significant pain below the knee and no change in her edema. She has been wearing her long leg elastic compression stocking and taking ibuprofen as instructed. She did have some "fluttering" a few nights ago but denied any fever. She states she has not been feeling that well.  Review of Systems     Objective:   Physical ExamBP 150/94  Pulse 82  Resp 20  Ht 5\' 8"  (1.727 m)  Wt 323 lb (146.512 kg)  BMI 49.11 kg/m2  General alert and oriented x3 in no apparent distress Left lower extremity with some ecchymosis in the mid to proximal thigh. Mild-to-moderate tenderness along the course the great saphenous vein. Stab phlebectomy sites healing nicely. 3+ dorsalis pedis pulse palpable.   Today I ordered a venous duplex exam of the left leg which are reviewed and interpreted. There is no DVT. Great saphenous vein is totally occluded from the distal insertion site to the proximal thigh and there is no reflux in the remaining great saphenous vein at the junction.    Assessment:     Good result following laser ablation left great saphenous vein with multiple stab phlebectomy for painful varicosities    Plan:     We'll schedule similar procedure on the right leg and near future when patient is ready to proceed

## 2011-10-10 ENCOUNTER — Telehealth: Payer: Self-pay

## 2011-10-10 NOTE — Telephone Encounter (Signed)
i am not sure about vein procedure - potentially pain can raise blood pressure. I would make sure she is taking some tylenol a few times a day   She needs to be eval as scheduled.   If she has has significant HA, chest pain, nausea, shortness of breath as the note says, then this cannot wait until tomorrow, and she needs to be seen ASAP

## 2011-10-10 NOTE — Telephone Encounter (Signed)
Patient feels fine today but, advised if start experiencing symptoms she should go to urgent care or er asap

## 2011-10-10 NOTE — Telephone Encounter (Signed)
10/09/11 at 10 pm sudden onset of h/a and nausea; Pt felt relaxed but BP 186/98 no dizziness, no vomiting, SOB as usual and slight discomfort in chest. Pt took two ASA 81 mg. 30 mins later pt BP 155/88. Pt takes Diltiazem 300 mg 24 hr tab daily at night and HCTZ 25 mg daily at night. Pt spoke with CAN and was advised to make appt in our office. Pt has scheduled appt with Dr Dayton Martes on 10/11/11 at 7:30 am. Presently pt is at work does not know what BP is today but feels OK; no h/a. Pt wants to know if recent vein repair could cause BP to spike and what should pt do if happens again. 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 Pt uses [facility] if pharmacy needed.

## 2011-10-11 ENCOUNTER — Ambulatory Visit (INDEPENDENT_AMBULATORY_CARE_PROVIDER_SITE_OTHER): Payer: BC Managed Care – PPO | Admitting: Family Medicine

## 2011-10-11 ENCOUNTER — Encounter: Payer: Self-pay | Admitting: Family Medicine

## 2011-10-11 VITALS — BP 130/78 | HR 64 | Temp 97.6°F | Wt 312.0 lb

## 2011-10-11 DIAGNOSIS — R079 Chest pain, unspecified: Secondary | ICD-10-CM

## 2011-10-11 DIAGNOSIS — I10 Essential (primary) hypertension: Secondary | ICD-10-CM

## 2011-10-11 NOTE — Progress Notes (Signed)
Subjective:    Patient ID: Kimberly Patton, female    DOB: 03-May-1956, 56 y.o.   MRN: [id number]  HPI Kimberly Patton is a pleasant 56 yo pt of Dr. Ermalene Searing with h/o morbidly obese, HTN, and  SVT here for elevated BP.  On 10/08/11, felt "funny" all day.  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  She felt relaxed but BP 186/98 no dizziness, no vomiting, SOB as usual and slight discomfort in chest. Pt took two ASA 81 mg and 30 mins later pt BP 155/88.  Takes Diltiazem 300 mg 24 hr tab daily at night and HCTZ 25 mg daily at night.   Had laser ablation: left Greater Saphenous Vein on 10/01/2011 by Dr. Hart Rochester.  Saw him the day after these symptoms started and told him of these symptoms. LE doppler neg for DVT.  Has had NO LE pain since this started other than very tight thigh high compression hose on left. Advised to stop taking Ibuprofen- was taking over 8 per day.  Since then, no severe HA (mild sinus headache) and BP has been ok.  Patient Active Problem List  Diagnoses  . FIBROIDS, UTERUS  . SUPRAVENTRICULAR TACHYCARDIA  . PHARYNGITIS, PERSISTENT  . ALLERGIC RHINITIS  . ASTHMA, PERSISTENT, MODERATE  . ASTHMA, WITH ACUTE EXACERBATION  . HERNIA  . CONTACT DERMATITIS&OTHER ECZEMA DUE TO PLANTS  . DERMATITIS, CONTACT, NOS  . PETECHIAE  . EDEMA- LOCALIZED  . Palpitations  . Varicose veins  . Varicose veins of lower extremities with other complications  . HTN (hypertension)   Past Medical History  Diagnosis Date  . ASTHMA, PERSISTENT, MODERATE 04/03/2007  . ALLERGIC RHINITIS 04/03/2007  . Hypertension   . Morbid obesity    Past Surgical History  Procedure Date  . Cholecystectomy 1995  . Appendectomy 1996  . Ovarian cyst removal 1970  . Ventral hernia repair 2000  . Foot surgery 1995 / 1996    x2  . Pilonidal cyst excision 1975  . Skin cancer excision    History  Substance Use Topics  . Smoking status: Never  Smoker   . Smokeless tobacco: Never Used  . Alcohol Use: No   Family History  Problem Relation Age of Onset  . Diabetes    . Hypertension    . Stroke    . Hemophilia    . Breast cancer    . Cancer Mother   . Diabetes Mother   . Hypertension Mother   . Diabetes Father   . Hypertension Father    No Known Allergies Current Outpatient Prescriptions on File Prior to Visit  Medication Sig Dispense Refill  . Calcium Carbonate-Vit D-Min (CALCIUM 1200) 1200-1000 MG-UNIT CHEW Chew 1 tablet by mouth daily.        Marland Kitchen diltiazem (CARDIZEM CD) 300 MG 24 hr capsule TAKE 1 CAPSULE (300 MG TOTAL) BY MOUTH DAILY.  30 capsule  0  . Glucosamine-Chondroitin 1500-1200 MG/30ML LIQD Take by mouth daily.        . hydrochlorothiazide (HYDRODIURIL) 25 MG tablet TAKE 1 TABLET BY MOUTH EVERY DAY  30 tablet  7  . Multiple Vitamin (MULTIVITAMIN) tablet Take 1 tablet by mouth daily.         The PMH, PSH, Social History, Family History, Medications, and allergies have been reviewed in [facility], and have been updated if relevant.    Review of Systems See HPI    Objective:  Physical Exam BP 130/78  Pulse 64  Temp 97.6 F (36.4 C)  Wt 312 lb (141.522 kg)  General alert and oriented x3 in no apparent distress  Resp:  CTA bilaterally CVS:  RRR Left lower extremity with some ecchymosis in the mid to proximal thigh. No palpable cords. 3+ dorsalis pedis pulse palpable.      Assessment & Plan:   1. HTN (hypertension)  Improved. Likely multifactorial pain- post operative pain, compression hose, NSAID use then rebound headache with abrupt cessation of NSAID use. EKG reassuring- sinus brady, otherwise unremarkable.  Follow up with PCP as needed.   2. Chest pain  Resolved.  See above. EKG 12-Lead

## 2011-10-16 NOTE — Procedures (Unsigned)
DUPLEX DEEP VENOUS EXAM - LOWER EXTREMITY  INDICATION:  Varicose vein with other complication  HISTORY:  Edema:  Yes Trauma/Surgery:  Left great saphenous vein laser ablation on 10/01/2011 Pain:  Left thigh tenderness PE:  No Previous DVT:  No Anticoagulants: Other:  DUPLEX EXAM:               CFV   SFV   PopV  PTV    GSV               R  L  R  L  R  L  R   L  R  L Thrombosis    o  o     o     o      o     + Spontaneous   +  +     +     +      +     o Phasic        +  +     +     +      +     o Augmentation  +  +     +     +      +     o Compressible  +  +     +     +      +     o Competent     +  +     +     +      +     o  Legend:  + - yes  o - no  p - partial  D - decreased  IMPRESSION: 1. No evidence of deep vein thrombosis noted in the left lower     extremity based on limited visualization due to patient body     habitus. 2. The left great saphenous vein appears totally occluded from the     distal insertion site to the proximal/mid thigh level.  The     proximal thigh level great saphenous vein appears patent. 3. No reflux of >500 milliseconds is noted in the patent left lower     extremity veins.   _____________________________ Quita Skye Hart Rochester, M.D.  CH/MEDQ  D:  10/08/2011  T:  10/08/2011  Job:  [id number]

## 2011-10-22 ENCOUNTER — Other Ambulatory Visit: Payer: BC Managed Care – PPO | Admitting: Vascular Surgery

## 2011-10-29 ENCOUNTER — Encounter (INDEPENDENT_AMBULATORY_CARE_PROVIDER_SITE_OTHER): Payer: BC Managed Care – PPO | Admitting: *Deleted

## 2011-10-29 ENCOUNTER — Telehealth: Payer: Self-pay

## 2011-10-29 ENCOUNTER — Telehealth: Payer: Self-pay | Admitting: Family Medicine

## 2011-10-29 DIAGNOSIS — M25473 Effusion, unspecified ankle: Secondary | ICD-10-CM

## 2011-10-29 DIAGNOSIS — I83893 Varicose veins of bilateral lower extremities with other complications: Secondary | ICD-10-CM

## 2011-10-29 DIAGNOSIS — Z48812 Encounter for surgical aftercare following surgery on the circulatory system: Secondary | ICD-10-CM

## 2011-10-29 DIAGNOSIS — M79669 Pain in unspecified lower leg: Secondary | ICD-10-CM

## 2011-10-29 NOTE — Telephone Encounter (Signed)
Caller: Kimberly Patton/Patient; PCP: Kerby Nora E.; CB#: [phone]; ; ; Call regarding Leg Pain and Fever.;  [name] developed left leg cramps on 10/26/11.  Cramps are now worsening.  Called office this morning.  Had [place] today which was negative with the exception of an enlarged lymph node.  Developed a fever today. Has not taken temp.  Ankle is slightly swollen.  Denies redness.  Pain is unbearable at times.  Utilized Leg Non Injury Guideline.  Advised ED (no appts available in office).  Pt refuses and requests appt for tomorrow.  Placed pt on hold to schedule appt.  Pt hung up.  Attempted to call Terita back numerous times.  Phone went straight to voicemail.  Left message.

## 2011-10-29 NOTE — Telephone Encounter (Signed)
Trinika called back after getting nurse's message to schedule appt for tomorrow.  No appt's available that met Kimberly Patton's schedule needs.  Again, advised ED this evening.  States she will discuss it with her husband.

## 2011-10-29 NOTE — Telephone Encounter (Signed)
Appointment scheduled at 3pm with Mrs. Kimberly Patton.

## 2011-10-29 NOTE — Telephone Encounter (Signed)
Noted. See previous note from vascular MD as well.

## 2011-10-29 NOTE — Telephone Encounter (Signed)
PC from pt. C/o swelling (L) ankle, and pain (L) calf since Saturday.  Denies redness or inflammation in left calf.  States ankles are usually swollen, but thinks there is more swelling in left ankle compared to right ankle.  S/p left GSV laser ablation and stab phlebectomies of 10/01/11.  Discussed w/ Dr. Hart Rochester.  Recommends to schedule venous duplex of left lower extremity to R/O DVT.  States if study negative, then pt. doesn't need to see MD.

## 2011-10-30 ENCOUNTER — Ambulatory Visit: Payer: BC Managed Care – PPO | Admitting: Vascular Surgery

## 2011-10-31 ENCOUNTER — Ambulatory Visit (INDEPENDENT_AMBULATORY_CARE_PROVIDER_SITE_OTHER): Payer: BC Managed Care – PPO | Admitting: Family Medicine

## 2011-10-31 ENCOUNTER — Encounter: Payer: Self-pay | Admitting: Family Medicine

## 2011-10-31 VITALS — BP 130/80 | HR 99 | Temp 98.5°F | Ht 68.0 in | Wt 316.1 lb

## 2011-10-31 DIAGNOSIS — M2550 Pain in unspecified joint: Secondary | ICD-10-CM

## 2011-10-31 DIAGNOSIS — M79609 Pain in unspecified limb: Secondary | ICD-10-CM

## 2011-10-31 DIAGNOSIS — R509 Fever, unspecified: Secondary | ICD-10-CM

## 2011-10-31 DIAGNOSIS — M79605 Pain in left leg: Secondary | ICD-10-CM

## 2011-10-31 LAB — HEPATIC FUNCTION PANEL
ALT: 25 U/L (ref 0–35)
AST: 33 U/L (ref 0–37)
Albumin: 4 g/dL (ref 3.5–5.2)
Alkaline Phosphatase: 85 U/L (ref 39–117)
Bilirubin, Direct: 0 mg/dL (ref 0.0–0.3)
Total Bilirubin: 0.5 mg/dL (ref 0.3–1.2)
Total Protein: 7.4 g/dL (ref 6.0–8.3)

## 2011-10-31 LAB — CBC WITH DIFFERENTIAL/PLATELET
Basophils Absolute: 0 10*3/uL (ref 0.0–0.1)
Basophils Relative: 0.3 % (ref 0.0–3.0)
Eosinophils Absolute: 0 10*3/uL (ref 0.0–0.7)
Eosinophils Relative: 0.2 % (ref 0.0–5.0)
HCT: 38.3 % (ref 36.0–46.0)
Hemoglobin: 13 g/dL (ref 12.0–15.0)
Lymphocytes Relative: 20 % (ref 12.0–46.0)
Lymphs Abs: 0.6 10*3/uL — ABNORMAL LOW (ref 0.7–4.0)
MCHC: 33.9 g/dL (ref 30.0–36.0)
MCV: 83.5 fl (ref 78.0–100.0)
Monocytes Absolute: 0.2 10*3/uL (ref 0.1–1.0)
Monocytes Relative: 7.1 % (ref 3.0–12.0)
Neutro Abs: 2.2 10*3/uL (ref 1.4–7.7)
Neutrophils Relative %: 72.4 % (ref 43.0–77.0)
Platelets: 164 10*3/uL (ref 150.0–400.0)
RBC: 4.59 Mil/uL (ref 3.87–5.11)
RDW: 14.7 % — ABNORMAL HIGH (ref 11.5–14.6)
WBC: 3.1 10*3/uL — ABNORMAL LOW (ref 4.5–10.5)

## 2011-10-31 MED ORDER — DOXYCYCLINE HYCLATE 100 MG PO TABS: 100.0000 mg | ORAL_TABLET | Freq: Two times a day (BID) | ORAL | Status: AC

## 2011-10-31 MED ORDER — DOXYCYCLINE HYCLATE 100 MG PO TABS: 100.0000 mg | ORAL_TABLET | Freq: Two times a day (BID) | ORAL | Status: DC

## 2011-10-31 NOTE — Progress Notes (Signed)
Patient Name: Kimberly Patton Date of Birth: 06-28-1955 Age: 56 y.o. Medical Record Number: [id number] Gender: female Date of Encounter: 10/31/2011  History of Present Illness:  Kimberly Patton is a 56 y.o. very pleasant female patient who presents with the following:  Had a varicose vein procedure 4 weeks ago, then BP mwent up, and this past Sunday, let was hurting tremendously. No blood clot, but had an enlarged lymph node in the groin area. Also on Lamisil. Then went up to a fever again at 101.4. Vascular surgery at a lower extremity ultrasound yesterday, she did not have a blood clot or deep vein thrombosis. They did examine the popliteal space as well as the femoral space and what sounds like the entire region that they did do some laser procedure on approximately a month ago.  Now the patient is having some relatively extensive pain. She did have a fever as described. She is having diffuse arthralgias, but this is mostly on the LEFT side. No other focal symptoms. No runny nose, cough, earache, sore throat, dysuria, backache, increased urgency. She's had a pain in her leg and around the medial aspect, anterior aspect of the thigh. There is some redness there, but no focal warmth.    Past Medical History, Surgical History, Social History, Family History, Problem List, Medications, and Allergies have been reviewed and updated if relevant.  Review of Systems: ROS: GEN: Acute illness details above GI: Tolerating PO intake GU: maintaining adequate hydration and urination Pulm: No SOB Interactive and getting along well at home.  Otherwise, ROS is as per the HPI.   Physical Examination: Filed Vitals:   10/31/11 1132  BP: 130/80  Pulse: 99  Temp: 98.5 F (36.9 C)  TempSrc: Oral  Height: 5\' 8"  (1.727 m)  Weight: 316 lb 1.9 oz (143.391 kg)  SpO2: 97%    Body mass index is 48.07 kg/(m^2).   GEN: A and O x 3. WDWN. NAD.    ENT: Nose clear, ext NML.  No LAD.  No JVD.  TM's  clear. Oropharynx clear.  PULM: Normal WOB, no distress. No crackles, wheezes, rhonchi. CV: RRR, no M/G/R, No rubs, No JVD.   EXT: warm and well-perfused, No c/c/e. PSYCH: Pleasant and conversant. SKIN: some coloration change on the LEFT medial thigh compared to the RIGHT thigh. This area was palpated and is tender to palpation. Mildly reddish or darker in coloration compared to the RIGHT side. No fluctuance. No induration. Patient also has some tenderness in the popliteal fossa. Homans test is negative.  Assessment and Plan:  1. Fever  CBC with Differential, Hepatic function panel, doxycycline (VIBRA-TABS) 100 MG tablet, DISCONTINUED: doxycycline (VIBRA-TABS) 100 MG tablet  2. Arthralgia  CBC with Differential, Hepatic function panel, doxycycline (VIBRA-TABS) 100 MG tablet, DISCONTINUED: doxycycline (VIBRA-TABS) 100 MG tablet  3. Leg pain, left     Fever without a clear source and the patient one month postoperative from a vascular procedure.  The month is May. Fever and arthralgia without clear source. Will cover with doxycycline, which would provide adequate tickborne illness coverage. This would also provide coverage for potential cellulitis.  Discuss with the patient that I thought that her arthralgia was unlikely to be from her Lamisil, but certainly this would be not unreasonable if her symptoms continued to do a trial off of this medication, which he is taking for toenail fungus.  If her leg pain symptoms continue, I think it is also very reasonable and recommended that she have vascular surgery  M.D. Evaluate her face to face, given that I have essentially no experience with the type of procedure that she had.  Orders Today: Orders Placed This Encounter  Procedures  . CBC with Differential  . Hepatic function panel    Medications Today: Meds ordered this encounter  Medications  . terbinafine (LAMISIL) 250 MG tablet    Sig: Take 250 mg by mouth daily.  Marland Kitchen DISCONTD: doxycycline  (VIBRA-TABS) 100 MG tablet    Sig: Take 1 tablet (100 mg total) by mouth 2 (two) times daily.    Dispense:  20 tablet    Refill:  0  . doxycycline (VIBRA-TABS) 100 MG tablet    Sig: Take 1 tablet (100 mg total) by mouth 2 (two) times daily.    Dispense:  20 tablet    Refill:  0

## 2011-11-01 ENCOUNTER — Encounter: Payer: Self-pay | Admitting: *Deleted

## 2011-11-06 NOTE — Procedures (Unsigned)
DUPLEX DEEP VENOUS EXAM - LOWER EXTREMITY  INDICATION:  Increased pain and swelling.  HISTORY:  Edema:  Yes. Trauma/Surgery:  One month status post left GSV ablation. Pain:  Yes. PE:  No. Previous DVT:  No. Anticoagulants:  No. Other:  DUPLEX EXAM:               CFV   SFV   PopV  PTV    GSV               R  L  R  L  R  L  R   L  R  L Thrombosis       0     0     0      0 Spontaneous      +     +     + Phasic           +     +     + Augmentation     +     +     +      + Compressible     +     +     +      + Competent  Legend:  + - yes  o - no  p - partial  D - decreased  IMPRESSION: 1. Technically difficult study due to body habitus and patient pain. 2. No evidence of deep vein thrombosis in the left lower extremity. 3. The left great saphenous vein has been surgically ablated.   _____________________________ Quita Skye. Hart Rochester, M.D.  LT/MEDQ  D:  10/30/2011  T:  10/30/2011  Job:  [id number]

## 2011-11-08 ENCOUNTER — Other Ambulatory Visit: Payer: Self-pay | Admitting: Family Medicine

## 2011-11-15 ENCOUNTER — Emergency Department (HOSPITAL_COMMUNITY): Payer: BC Managed Care – PPO

## 2011-11-15 ENCOUNTER — Emergency Department (HOSPITAL_COMMUNITY)
Admission: EM | Admit: 2011-11-15 | Discharge: 2011-11-15 | Disposition: A | Discharge status: Home / Self Care | Payer: BC Managed Care – PPO | Attending: Emergency Medicine | Admitting: Emergency Medicine

## 2011-11-15 ENCOUNTER — Encounter (HOSPITAL_COMMUNITY): Payer: Self-pay

## 2011-11-15 ENCOUNTER — Ambulatory Visit: Payer: BC Managed Care – PPO | Admitting: Physician Assistant

## 2011-11-15 ENCOUNTER — Telehealth: Payer: Self-pay | Admitting: Internal Medicine

## 2011-11-15 DIAGNOSIS — R002 Palpitations: Secondary | ICD-10-CM

## 2011-11-15 DIAGNOSIS — R0602 Shortness of breath: Secondary | ICD-10-CM | POA: Insufficient documentation

## 2011-11-15 DIAGNOSIS — I10 Essential (primary) hypertension: Secondary | ICD-10-CM | POA: Insufficient documentation

## 2011-11-15 DIAGNOSIS — R079 Chest pain, unspecified: Secondary | ICD-10-CM | POA: Insufficient documentation

## 2011-11-15 LAB — DIFFERENTIAL
Basophils Absolute: 0.1 10*3/uL (ref 0.0–0.1)
Basophils Relative: 1 % (ref 0–1)
Eosinophils Absolute: 0.1 10*3/uL (ref 0.0–0.7)
Eosinophils Relative: 1 % (ref 0–5)
Lymphocytes Relative: 23 % (ref 12–46)
Lymphs Abs: 1.9 10*3/uL (ref 0.7–4.0)
Monocytes Absolute: 0.4 10*3/uL (ref 0.1–1.0)
Monocytes Relative: 5 % (ref 3–12)
Neutro Abs: 5.8 10*3/uL (ref 1.7–7.7)
Neutrophils Relative %: 70 % (ref 43–77)

## 2011-11-15 LAB — CBC
HCT: 38.2 % (ref 36.0–46.0)
Hemoglobin: 12.8 g/dL (ref 12.0–15.0)
MCH: 27.9 pg (ref 26.0–34.0)
MCHC: 33.5 g/dL (ref 30.0–36.0)
MCV: 83.4 fL (ref 78.0–100.0)
Platelets: 335 10*3/uL (ref 150–400)
RBC: 4.58 MIL/uL (ref 3.87–5.11)
RDW: 14.3 % (ref 11.5–15.5)
WBC: 7.8 10*3/uL (ref 4.0–10.5)

## 2011-11-15 LAB — BASIC METABOLIC PANEL
BUN: 18 mg/dL (ref 6–23)
CO2: 24 mEq/L (ref 19–32)
Calcium: 9.7 mg/dL (ref 8.4–10.5)
Chloride: 99 mEq/L (ref 96–112)
Creatinine, Ser: 0.51 mg/dL (ref 0.50–1.10)
GFR calc Af Amer: >90 mL/min (ref >90)
GFR calc non Af Amer: >90 mL/min (ref >90)
Glucose, Bld: 110 mg/dL — ABNORMAL HIGH (ref 70–99)
Potassium: 3.2 mEq/L — ABNORMAL LOW (ref 3.5–5.1)
Sodium: 138 mEq/L (ref 135–145)

## 2011-11-15 LAB — PRO B NATRIURETIC PEPTIDE: Pro B Natriuretic peptide (BNP): 241.1 pg/mL — ABNORMAL HIGH (ref 0–125)

## 2011-11-15 LAB — CK TOTAL AND CKMB (NOT AT ARMC)
CK, MB: 1.4 ng/mL (ref 0.3–4.0)
Relative Index: INVALID (ref 0.0–2.5)
Total CK: 64 U/L (ref 7–177)

## 2011-11-15 LAB — D-DIMER, QUANTITATIVE: D-Dimer, Quant: 0.5 ug/mL-FEU — ABNORMAL HIGH (ref 0.00–0.48)

## 2011-11-15 LAB — TROPONIN I
Troponin I: <0.3 ng/mL (ref <0.30)
Troponin I: <0.3 ng/mL (ref <0.30)

## 2011-11-15 MED ORDER — IOHEXOL 300 MG/ML  SOLN
100.0000 mL | Freq: Once | INTRAMUSCULAR | Status: AC | PRN
  Administered 2011-11-15: 100 mL via INTRAVENOUS

## 2011-11-15 MED ORDER — ASPIRIN 81 MG PO CHEW
324.0000 mg | CHEWABLE_TABLET | Freq: Once | ORAL | Status: AC
  Administered 2011-11-15: 324 mg via ORAL
  Filled 2011-11-15: qty 4

## 2011-11-15 NOTE — Telephone Encounter (Signed)
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 These activities occurred in the gymnasium where she states it " was very warm in there".  She is not experiencing any palpitations at this time but is very concerned. The school nurse has checked her vital signs and I have spoken with her. BP 130/90 Heart rate: 80 Resp:14  No distress noted.  Appt made with Lorin Picket for 3:45 this afternoon. Appt confirmed with pt. She is in no distress at this time and has stated that if this occurs again today she will go to the ED and call our office. Mylo Red RN

## 2011-11-15 NOTE — ED Provider Notes (Signed)
Patient turned over to me by Dr. Preston Fleeting.  Patient had been having palpitations and he did discuss Holter monitor with cardiology and they will arrange this for her.  Patient was awaiting a repeat troponin in this is negative so patient is now safe for discharge home.  Nat Christen, MD 11/15/11 [phone]

## 2011-11-15 NOTE — Discharge Instructions (Signed)
The [facility] Cardiology office will call you with a time to come in to be fitted for your Holter monitor. Make an appointment with Dr. Ladona Ridgel next week. Return to the emergency department if you have any problems before then.  Palpitations  A palpitation is the feeling that your heartbeat is irregular or is faster than normal. Although this is frightening, it usually is not serious. Palpitations may be caused by excesses of smoking, caffeine, or alcohol. They are also brought on by stress and anxiety. Sometimes, they are caused by heart disease. Unless otherwise noted, your caregiver did not find any signs of serious illness at this time. HOME CARE INSTRUCTIONS  To help prevent palpitations:  Drink decaffeinated coffee, tea, and soda pop. Avoid chocolate.   If you smoke or drink alcohol, quit or cut down as much as possible.   Reduce your stress or anxiety level. Biofeedback, yoga, or meditation will help you relax. Physical activity such as swimming, jogging, or walking also may be helpful.  SEEK MEDICAL CARE IF:   You continue to have a fast heartbeat.   Your palpitations occur more often.  SEEK IMMEDIATE MEDICAL CARE IF: You develop chest pain, shortness of breath, severe headache, dizziness, or fainting. Document Released: 05/25/2000 Document Revised: 05/17/2011 Document Reviewed: 07/25/2007 [facility] Patient Information 2012 [place], [place].  Cardiac Event Monitoring A cardiac event monitor is a small recording device used to help detect abnormal heart rhythms. When the heart does not beat in a stable or regular pattern, this is called an arrhythmia. An arrhythmia may or may not be felt. Palpitations, or fast heart beats, may be felt. A cardiac event monitor is used to record the heart rhythm when noticeable symptoms such as the following occur:  Palpitations, such as heart racing or fluttering.   Dizziness.   Fainting or lightheadedness.   Unexplained weakness.  The monitor is  wired to two electrodes placed on your chest. Electrodes are flat sticky disks that attach to your skin. The monitor stores the heart rhythm and can be worn for up to 30 days. You will wear the monitor at all times, except when bathing. When you feel symptoms of heart trouble, such as dizziness, weakness, lightheadedness, palpitations, "thumping," shortness of breath or a fluttering or racing heart, you will push a button on the monitor to record the heart rhythm.  Your caregiver will also ask you to keep a diary of your activities, such as walking, doing chores and taking medicine. Be sure to note what you are doing when you experience heart symptoms. This will help your caregiver determine what might be contributing to your symptoms. The information stored in your monitor will be reviewed by your caregiver alongside your diary entries.  LET YOUR CAREGIVER KNOW ABOUT: Allergies to any kind of medical tape. PROCEDURE  A technician will prepare your chest for the electrode placement. The technician will show you how to place the electrodes, how to work the monitor, how to replace the batteries and how to fill in your diary. Take time to practice using the monitor before you leave the office. Make sure you understand how to send the information from the monitor to your doctor. This requires a telephone with a land line, not a cellphone.   If you start to feel lightheaded, dizzy, weak or have fluttering or racing sensations in your heart, press the record button. The monitor is always on and records what happened slightly before you pressed the button, so do not worry about  being too late to get good information.  HOME CARE INSTRUCTIONS   Wear your monitor at all times, except when you are in water:   Do not get the monitor wet.   Take the monitor off when bathing, do not swim or use a hot tub with it on.   Keep your skin clean, do not put body lotion or moisturizer on your chest.   It's possible  that your skin under the electrodes could become irritated. To keep this from happening, try to put the electrodes in slightly different places on your chest. However, they must remain in the area under your left breast and in the upper right section of your chest.   Change the electrodes daily or any time they stop sticking to your skin. You might need to use tape to keep them on.   Make sure the monitor is safely clipped to your clothing or in a location close to your body that your caregiver recommends.   Keep a diary of your activities, especially what you were doing when you pushed the button to record your heart symptoms.   Change the batteries as recommended by your caregiver.   Send the recorded information as recommended by your caregiver. It is important to understand that your caregiver does not have instantaneous results when a recording is made.  SEEK MEDICAL CARE IF:   Your doctor reviews your records and advises you to come to the office or go to the ER.   You have questions or concerns about the cardiac event monitor.  SEEK IMMEDIATE MEDICAL CARE IF:   You have chest pain.   You have difficulty breathing or shortness of breath.   You develop a very fast heartbeat or your heart "skips" beats.   You develop dizziness which lasts several minutes.   You faint or feel you are about to faint.  MAKE SURE YOU:   Understand these instructions.   Will watch your condition.   Will get help right away if you are not doing well or get worse.  Document Released: 03/06/2008 Document Revised: 05/17/2011 Document Reviewed: 03/06/2008 [facility] Patient Information 2012 [place], [place].

## 2011-11-15 NOTE — Telephone Encounter (Signed)
Patient is a Engineer, site, attending graduation.  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  Patient complains of dizziness and SOB also states she became very sweaty hair drenched over the last hour.  Patient is concerned

## 2011-11-15 NOTE — ED Notes (Signed)
Patient states onset one week ago chest pain today at school developed chest pain radiating left upper extremity, diaphoretic, with shortness of breath and palpitations.  Currently pain 0/10 resting comfortably on stretcher. Airway intact bilateral equal chest rise and fall.  States feels better.

## 2011-11-15 NOTE — ED Provider Notes (Signed)
History     CSN: [id number]  Arrival date & time 11/15/11  1206   First MD Initiated Contact with Patient 11/15/11 1235      Chief Complaint  Patient presents with  . Palpitations    (Consider location/radiation/quality/duration/timing/severity/associated sxs/prior treatment) Patient is a 56 y.o. female presenting with palpitations. The history is provided by the patient.  Palpitations   She has a history of palpitations and has been having problems with palpitations for the last week. They're bothering her most when she is laying down to go to sleep. She usually uses a breathing techniques to get over the palpitations but they have not been effective this week. Today, she went into school for graduation and had a severe episode of palpitations which is associated with dizziness, chest heaviness, nausea, and diaphoresis. She went to the school nurse who advised her to see her doctor. She called her doctor's office but was not able to get an appointment until later this afternoon so she came to the emergency department. She is unable to describe all her palpitations as to whether her heart is beating irregularly or not. She does have a history of palpitations and states that what she is been having this past week is different from what she had been having previously. Symptoms are severe. Nothing makes it better nothing makes it worse.  Past Medical History  Diagnosis Date  . ASTHMA, PERSISTENT, MODERATE 04/03/2007  . ALLERGIC RHINITIS 04/03/2007  . Hypertension   . Morbid obesity     Past Surgical History  Procedure Date  . Cholecystectomy 1995  . Appendectomy 1996  . Ovarian cyst removal 1970  . Ventral hernia repair 2000  . Foot surgery 1995 / 1996    x2  . Pilonidal cyst excision 1975  . Skin cancer excision     Family History  Problem Relation Age of Onset  . Diabetes    . Hypertension    . Stroke    . Hemophilia    . Breast cancer    . Cancer Mother   . Diabetes  Mother   . Hypertension Mother   . Diabetes Father   . Hypertension Father     History  Substance Use Topics  . Smoking status: Never Smoker   . Smokeless tobacco: Never Used  . Alcohol Use: No    OB History    Grav Para Term Preterm Abortions TAB SAB Ect Mult Living                  Review of Systems  Cardiovascular: Positive for palpitations.  All other systems reviewed and are negative.    Allergies  Review of patient's allergies indicates no known allergies.  Home Medications   Current Outpatient Rx  Name Route Sig Dispense Refill  . CALCIUM 1200 1200-1000 MG-UNIT PO CHEW Oral Chew 1 tablet by mouth daily.      Marland Kitchen DILTIAZEM HCL ER COATED BEADS 300 MG PO CP24  TAKE 1 CAPSULE (300 MG TOTAL) BY MOUTH DAILY. 30 capsule 6  . GLUCOSAMINE CHONDR COMPLEX PO Oral Take 1 tablet by mouth daily.    Marland Kitchen HYDROCHLOROTHIAZIDE 25 MG PO TABS  TAKE 1 TABLET BY MOUTH EVERY DAY 30 tablet 7  . ONE-DAILY MULTI VITAMINS PO TABS Oral Take 1 tablet by mouth daily.        BP 138/82  Pulse 80  Temp(Src) 98.1 F (36.7 C) (Oral)  Resp 18  Ht 5\' 8"  (1.727 m)  Wt 320 lb (  145.151 kg)  BMI 48.66 kg/m2  SpO2 98%  Physical Exam  Nursing note and vitals reviewed. Morbidly obese 56 year old female who is resting comfortably and in no acute distress. Vital signs are normal. Oxygen saturation is 95% which is normal. Head is normocephalic and atraumatic. PERRLA, EOMI. Oropharynx is clear. Neck is nontender and supple without adenopathy or JVD. Lungs are clear without rales, wheezes, rhonchi. Heart has regular rate and rhythm without murmur. Back is nontender and there is no chest wall tenderness. Abdomen is obese, soft, nontender without masses or hepatosplenomegaly. Extremities have 1+ edema, no cyanosis. Full range of motion is present. Skin is warm and dry without rash. Neurologic: She is slightly anxious but mental status is otherwise normal, cranial nerves are intact, there are no focal motor or  sensory deficits.  ED Course  Procedures (including critical care time)  Results for orders placed during the hospital encounter of 11/15/11  CBC      Component Value Range   WBC 7.8  4.0 - 10.5 (K/uL)   RBC 4.58  3.87 - 5.11 (MIL/uL)   Hemoglobin 12.8  12.0 - 15.0 (g/dL)   HCT 16.1  09.6 - 04.5 (%)   MCV 83.4  78.0 - 100.0 (fL)   MCH 27.9  26.0 - 34.0 (pg)   MCHC 33.5  30.0 - 36.0 (g/dL)   RDW 40.9  81.1 - 91.4 (%)   Platelets 335  150 - 400 (K/uL)  BASIC METABOLIC PANEL      Component Value Range   Sodium 138  135 - 145 (mEq/L)   Potassium 3.2 (*) 3.5 - 5.1 (mEq/L)   Chloride 99  96 - 112 (mEq/L)   CO2 24  19 - 32 (mEq/L)   Glucose, Bld 110 (*) 70 - 99 (mg/dL)   BUN 18  6 - 23 (mg/dL)   Creatinine, Ser 7.82  0.50 - 1.10 (mg/dL)   Calcium 9.7  8.4 - 95.6 (mg/dL)   GFR calc non Af Amer >90  >90 (mL/min)   GFR calc Af Amer >90  >90 (mL/min)  DIFFERENTIAL      Component Value Range   Neutro Abs 5.8  1.7 - 7.7 (K/uL)   Lymphs Abs 1.9  0.7 - 4.0 (K/uL)   Monocytes Absolute 0.4  0.1 - 1.0 (K/uL)   Eosinophils Absolute 0.1  0.0 - 0.7 (K/uL)   Basophils Absolute 0.1  0.0 - 0.1 (K/uL)   Neutrophils Relative 70  43 - 77 (%)   Lymphocytes Relative 23  12 - 46 (%)   Monocytes Relative 5  3 - 12 (%)   Eosinophils Relative 1  0 - 5 (%)   Basophils Relative 1  0 - 1 (%)   Smear Review MORPHOLOGY UNREMARKABLE    PRO B NATRIURETIC PEPTIDE      Component Value Range   Pro B Natriuretic peptide (BNP) 241.1 (*) 0 - 125 (pg/mL)  D-DIMER, QUANTITATIVE      Component Value Range   D-Dimer, Quant 0.50 (*) 0.00 - 0.48 (ug/mL-FEU)  TROPONIN I      Component Value Range   Troponin I <0.30  <0.30 (ng/mL)  CK TOTAL AND CKMB      Component Value Range   Total CK 64  7 - 177 (U/L)   CK, MB 1.4  0.3 - 4.0 (ng/mL)   Relative Index RELATIVE INDEX IS INVALID  0.0 - 2.5    Ct Angio Chest W/cm &/or Wo Cm  11/15/2011  *RADIOLOGY REPORT*  Clinical Data: Palpitations, chest pain, shortness of  breath  CT ANGIOGRAPHY CHEST  Technique:  Multidetector CT imaging of the chest using the standard protocol during bolus administration of intravenous contrast. Multiplanar reconstructed images including MIPs were obtained and reviewed to evaluate the vascular anatomy.  Contrast: OMNIPAQUE IOHEXOL 300 MG/ML SOLN  Comparison: CT chest 11/25/2007  Findings: Aorta normal caliber with minimal atherosclerotic calcification. No gross evidence of aortic aneurysm or dissection. Minimally enlarged lymph node versus splenule medial to the spleen 1.7 x 1.1 cm image 71, previously 1.7 x 1.4 cm. Spleen appears enlarged, 14.0 x 7.3 cm image 72, greater than 10 cm length. Remaining visualized portion of upper abdomen normal appearance. No definite thoracic adenopathy. Normal-sized axillary nodes noted bilaterally. Degradation of image quality secondary to body habitus. Scattered artifacts at pulmonary arteries particularly at hila and lower lobes. No definite evidence of pulmonary embolism identified. Minimal dependent atelectasis in the lower lobes. No pulmonary infiltrate, pleural effusion or pneumothorax. Inhomogeneous attenuation of the T1 and T2 vertebral bodies unchanged. No definite acute osseous findings.  IMPRESSION: No gross evidence of pulmonary embolism on exam limited by beam hardening artifacts due to body habitus. Minimal dependent atelectasis bilaterally. Splenic enlargement.  Original Report Authenticated By: Lollie Marrow, M.D.   Dg Chest Port 1 View  11/15/2011  *RADIOLOGY REPORT*  Clinical Data: arrhythmia  PORTABLE CHEST - 1 VIEW  Comparison: 08/08/2009  Findings: Cardiomediastinal silhouette is stable.  No acute infiltrate or pleural effusion.  No pulmonary edema.  Bony thorax is stable.  IMPRESSION: No active disease.  No significant change.  Original Report Authenticated By: Natasha Mead, M.D.      Date: 11/15/2011  Rate: 102  Rhythm: sinus tachycardia and premature atrial contractions (PAC)   QRS Axis: normal  Intervals: normal  ST/T Wave abnormalities: normal  Conduction Disutrbances:Incomplete right bundle-branch block  Narrative Interpretation: Incomplete right bundle-branch block. When compared with ECG of 12/04/2007, no significant changes are seen.  Old EKG Reviewed: unchanged    1. Palpitations       MDM  Palpitations with episode of chest discomfort. I have reviewed her office records and she has GERD with a diagnosis of paroxysmal SVT. I wonder if she is actually having episodes of atrial fibrillation now. Also, with her obesity she is at risk for pulmonary embolism so d-dimer will be obtained to rule out pulmonary embolism and cardiac markers will be checked. She'll be maintained on a cardiac monitor while being observed in the emergency department.   D-dimer is come back slightly elevated so CT angiogram was obtained which was negative for pulmonary embolism.Troponin will be repeated and the patient discharged if negative.  I have set the patient up to have a 48 hour heart monitor done as an outpatient and she is to followup with her cardiologist next week.     Dione Booze, MD 11/15/11 [phone]

## 2011-11-15 NOTE — ED Notes (Signed)
Pt presents with onset of palpitations that woke her from sleep today.  Pt reports she has had same symptoms x 1 week, is usually able to "deep breath" through them ,but today was unable to.  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

## 2011-11-16 NOTE — Telephone Encounter (Signed)
Appointment was cancelled. Tereso Newcomer, PA-C  8:08 AM 11/16/2011

## 2011-11-29 ENCOUNTER — Encounter (INDEPENDENT_AMBULATORY_CARE_PROVIDER_SITE_OTHER): Payer: BC Managed Care – PPO

## 2011-11-29 DIAGNOSIS — R0989 Other specified symptoms and signs involving the circulatory and respiratory systems: Secondary | ICD-10-CM

## 2011-11-29 DIAGNOSIS — R002 Palpitations: Secondary | ICD-10-CM

## 2011-12-06 ENCOUNTER — Telehealth: Payer: Self-pay | Admitting: Internal Medicine

## 2011-12-06 NOTE — Telephone Encounter (Signed)
Pt has not been seen by MW  in over 3 years and will be a new consult. I offered to scheduled her with MW on 01/07/12 and pt refused. The next available with another physician will not be until 01/15/12. Pt instructed to go to the closest ER or urgent care if having trouble with her breathing. If she is needing surgical clearance, her surgeon or physician can call and speak with either Dr. Sherene Sires or another physician in our office.Pt verbalized understanding.

## 2011-12-06 NOTE — Telephone Encounter (Signed)
Pt called back. She still wants to be seen today. I advised her that dr wert was not here and that we did not have an opening (since this would be a consult). I advised her to go to ED or [facility] if she feels this is urgent. She says she still wants nurse to call her asap. Call cell [phone]. Kimberly Patton

## 2011-12-07 ENCOUNTER — Encounter: Payer: Self-pay | Admitting: Family Medicine

## 2011-12-07 ENCOUNTER — Telehealth: Payer: Self-pay

## 2011-12-07 ENCOUNTER — Ambulatory Visit (INDEPENDENT_AMBULATORY_CARE_PROVIDER_SITE_OTHER): Payer: BC Managed Care – PPO | Admitting: Family Medicine

## 2011-12-07 VITALS — BP 124/77 | HR 84 | Temp 97.6°F | Wt 329.8 lb

## 2011-12-07 DIAGNOSIS — R161 Splenomegaly, not elsewhere classified: Secondary | ICD-10-CM

## 2011-12-07 DIAGNOSIS — R0600 Dyspnea, unspecified: Secondary | ICD-10-CM

## 2011-12-07 DIAGNOSIS — R0609 Other forms of dyspnea: Secondary | ICD-10-CM

## 2011-12-07 DIAGNOSIS — R0989 Other specified symptoms and signs involving the circulatory and respiratory systems: Secondary | ICD-10-CM

## 2011-12-07 LAB — COMPREHENSIVE METABOLIC PANEL
ALT: 17 U/L (ref 0–35)
AST: 15 U/L (ref 0–37)
Albumin: 3.8 g/dL (ref 3.5–5.2)
Alkaline Phosphatase: 80 U/L (ref 39–117)
BUN: 17 mg/dL (ref 6–23)
CO2: 31 mEq/L (ref 19–32)
Calcium: 9.3 mg/dL (ref 8.4–10.5)
Chloride: 100 mEq/L (ref 96–112)
Creatinine, Ser: 0.4 mg/dL (ref 0.4–1.2)
GFR: 170.49 mL/min (ref >60.00)
Glucose, Bld: 81 mg/dL (ref 70–99)
Potassium: 3.4 mEq/L — ABNORMAL LOW (ref 3.5–5.1)
Sodium: 140 mEq/L (ref 135–145)
Total Bilirubin: 0.5 mg/dL (ref 0.3–1.2)
Total Protein: 7.2 g/dL (ref 6.0–8.3)

## 2011-12-07 LAB — CBC WITH DIFFERENTIAL/PLATELET
Basophils Absolute: 0 10*3/uL (ref 0.0–0.1)
Basophils Relative: 0.5 % (ref 0.0–3.0)
Eosinophils Absolute: 0.2 10*3/uL (ref 0.0–0.7)
Eosinophils Relative: 2.6 % (ref 0.0–5.0)
HCT: 35.6 % — ABNORMAL LOW (ref 36.0–46.0)
Hemoglobin: 11.9 g/dL — ABNORMAL LOW (ref 12.0–15.0)
Lymphocytes Relative: 21.6 % (ref 12.0–46.0)
Lymphs Abs: 1.7 10*3/uL (ref 0.7–4.0)
MCHC: 33.4 g/dL (ref 30.0–36.0)
MCV: 84.3 fl (ref 78.0–100.0)
Monocytes Absolute: 0.5 10*3/uL (ref 0.1–1.0)
Monocytes Relative: 6.4 % (ref 3.0–12.0)
Neutro Abs: 5.3 10*3/uL (ref 1.4–7.7)
Neutrophils Relative %: 68.9 % (ref 43.0–77.0)
Platelets: 307 10*3/uL (ref 150.0–400.0)
RBC: 4.22 Mil/uL (ref 3.87–5.11)
RDW: 14.9 % — ABNORMAL HIGH (ref 11.5–14.6)
WBC: 7.7 10*3/uL (ref 4.5–10.5)

## 2011-12-07 LAB — BRAIN NATRIURETIC PEPTIDE: Pro B Natriuretic peptide (BNP): 40 pg/mL (ref 0.0–100.0)

## 2011-12-07 MED ORDER — ALBUTEROL SULFATE HFA 108 (90 BASE) MCG/ACT IN AERS: 2.0000 | INHALATION_SPRAY | Freq: Four times a day (QID) | RESPIRATORY_TRACT | Status: DC | PRN

## 2011-12-07 NOTE — Patient Instructions (Addendum)
Blood work today. Pass by Marion's office to schedule heart ultrasound. Use albuterol as needed for shortness of breath but watch heart rate on this med. Return to see [place] in 2-3 weeks.

## 2011-12-07 NOTE — Assessment & Plan Note (Signed)
Episodes of dyspnea associated with diaphoresis and occasional chest heaviness over the last month. Given weight gain as well as described longstanding orthopnea, there is some concern for CHF (and given body habitus and HTN and h/o pSVT, is at risk for this).  However, nothing on exam to suggest left sided heart failure.  Check BNP - as well as order echo to eval this. Check blood work for other reversible causes. Doubt asthma related - but will fill albuterol inhaler to use prn chest tightness/wheezing although lungs clear today. If continued, recommend schedule appt with cardiology to further eval palpitation episodes.  Recent holter did not record anything significant, but pt states she did not have an episode while wearing this. If all normal eval, ?development of anxiety attacks.

## 2011-12-07 NOTE — Assessment & Plan Note (Signed)
?  vascular congestion leading to splenomegaly - check echo. Recent pulm CT did not mention any liver abnormality. Surgery has ordered CT abd/pelvis to follow h/o hernias - will await report and take a look at liver.

## 2011-12-07 NOTE — Telephone Encounter (Signed)
Dr. Shelle Iron just got a cancellation for a consult. I have scheduled this pt to see kc 12-10-11. Nothing further needed. Hazel Sams

## 2011-12-07 NOTE — Progress Notes (Signed)
Subjective:    Patient ID: Kimberly Patton, female    DOB: 1955-11-09, 56 y.o.   MRN: [id number]  HPI [id number]  Sxs started 11/15/2011.  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  During this episode, associated diaphoresis and lightheadedness.  Did feel some right sided chest pressure with this.  Sat down, wet towels on forehead helped.  Nurse at graduation stated heart was skipping beats (pt could not tell this).  Denies LOC.  No HA, chest pain.  Sleeps on 3 pillows at night (chronic).  Denies significant leg swelling.  Currently without symptoms.  Went to ER where workup was unrevealing.  Records reviewed:  Vitals normal.  Hgb 12.8, K 3.2, Cr 0.51, BNP 241, D dimer 0.5 (slightly elevated, with normal cutoff 0.48), CE neg x2.  EKG - incomplete RBBB, unchaged. CXR WNL and CT angio obtained at ER - no definite evidence of PE.  Some artifact from body habitus.  Splenomegaly at 14cm.   Recent heart monitor by Dr. Ladona Ridgel - turned in on Monday, hasn't heard results.  Did not have any episodes while had monitor on.  Briefly reviewed results with pt - intermittent PAC/PVC but nothing significant to explain sxs.  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  Describes episode as face flushed, trouble breathing, sweating.  Some nausea as well.  No chest discomfort this time.  Sitting and relaxing helped sxs.  H/o asthma, hasn't seen pulmonologist (Dr. Sherene Sires) for 3 years.  No problems since received pneumonia shot.  Not on albuterol inh anymore. Had varicose vein surgery in April.   H/o pSVT.  Pending hernia surgery. Does walk 3 hours a day in pool, no sxs during this. Noted continued weight gain.   Wt Readings from Last 3 Encounters:  12/07/11 329 lb 12 oz (149.574 kg)  11/15/11 320 lb (145.151 kg)  10/31/11 316 lb 1.9 oz (143.391 kg)   Past Medical History  Diagnosis  Date  . ASTHMA, PERSISTENT, MODERATE 04/03/2007  . ALLERGIC RHINITIS 04/03/2007  . Hypertension   . Morbid obesity    Patient Active Problem List  Diagnosis  . FIBROIDS, UTERUS  . SUPRAVENTRICULAR TACHYCARDIA  . PHARYNGITIS, PERSISTENT  . ALLERGIC RHINITIS  . ASTHMA, PERSISTENT, MODERATE  . ASTHMA, WITH ACUTE EXACERBATION  . HERNIA  . CONTACT DERMATITIS&OTHER ECZEMA DUE TO PLANTS  . DERMATITIS, CONTACT, NOS  . PETECHIAE  . EDEMA- LOCALIZED  . Palpitations  . Varicose veins  . Varicose veins of lower extremities with other complications  . HTN (hypertension)   Review of Systems Per HPI    Objective:   Physical Exam  Nursing note and vitals reviewed. Constitutional: She appears well-developed and well-nourished. No distress.       obese  HENT:  Head: Normocephalic and atraumatic.  Mouth/Throat: Oropharynx is clear and moist. No oropharyngeal exudate.  Neck: Normal range of motion. Neck supple. No hepatojugular reflux and no JVD present.  Cardiovascular: Normal rate, regular rhythm, normal heart sounds and intact distal pulses.   No murmur heard. Pulmonary/Chest: Effort normal and breath sounds normal. No respiratory distress. She has no wheezes. She has no rales.       Somewhat distant  Abdominal: Soft. Bowel sounds are normal. She exhibits no distension and no mass. There is no hepatosplenomegaly. There is tenderness (mild) in the right upper quadrant. There is no rebound and no guarding.  Tender at hernias Tender posterior lower ribcage on right  Musculoskeletal: She exhibits no edema.  Skin: Skin is warm and dry. No rash noted.  Psychiatric:       Nervous, anxious      Assessment & Plan:

## 2011-12-08 ENCOUNTER — Encounter (HOSPITAL_COMMUNITY): Payer: Self-pay | Admitting: *Deleted

## 2011-12-08 ENCOUNTER — Emergency Department (HOSPITAL_COMMUNITY): Payer: BC Managed Care – PPO

## 2011-12-08 ENCOUNTER — Emergency Department (HOSPITAL_COMMUNITY)
Admission: EM | Admit: 2011-12-08 | Discharge: 2011-12-08 | Disposition: A | Discharge status: Home / Self Care | Payer: BC Managed Care – PPO | Attending: Emergency Medicine | Admitting: Emergency Medicine

## 2011-12-08 DIAGNOSIS — R0602 Shortness of breath: Secondary | ICD-10-CM | POA: Insufficient documentation

## 2011-12-08 DIAGNOSIS — I10 Essential (primary) hypertension: Secondary | ICD-10-CM | POA: Insufficient documentation

## 2011-12-08 DIAGNOSIS — R002 Palpitations: Secondary | ICD-10-CM | POA: Insufficient documentation

## 2011-12-08 DIAGNOSIS — Z79899 Other long term (current) drug therapy: Secondary | ICD-10-CM | POA: Insufficient documentation

## 2011-12-08 DIAGNOSIS — R079 Chest pain, unspecified: Secondary | ICD-10-CM

## 2011-12-08 LAB — POCT I-STAT TROPONIN I: Troponin i, poc: 0 ng/mL (ref 0.00–0.08)

## 2011-12-08 MED ORDER — SODIUM CHLORIDE 0.9 % IV SOLN: INTRAVENOUS | Status: DC

## 2011-12-08 NOTE — ED Notes (Addendum)
Pt reports intermitant CP & sob for ~ 1 month, has been seen recently for the same with recent work ups, describes as tight. Sx onset tonight about 2300. Also reports L leg swelling, nausea, back pain. Pt alert, NAD, calm, interactive, skin W&D, resps e/u, speaking in clear complete sentences. H/o HTN. Recent CT. labwork done yesterday at [place] (resulted). Recent holter monitor ("reported this am as good").

## 2011-12-08 NOTE — ED Notes (Signed)
Pt states that if she is just waiting to find out if she should take and extra pill or not, she would like to go home.  Dr Nino Parsley is aware.

## 2011-12-08 NOTE — Discharge Instructions (Signed)
Followup with Dr. Ladona Ridgel for reevaluation.  Your blood tests, and chest x-ray, do not show any significant illness.  Return for worse or uncontrolled symptoms

## 2011-12-08 NOTE — ED Notes (Signed)
"  Wants to leave, but also afraid sx will come back over the w/e",  "feels better", encouraged to stay, agreeable.

## 2011-12-08 NOTE — ED Notes (Signed)
Pt is being seen by MD for intermittant chest pain. 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  It did not feel like a panic attack or pac.  The pain started in her RUQ and shot into her R chest accompanied by diaphoresis.  Pain is like a pressure.  By the time pt had waited in waiting room for 20 minutes pressure had dissipated.  Pt also c/o wheezing.  Her son says he audibly heard her as they were sitting in the car on Thurs.  No wheezing noted at this time.

## 2011-12-08 NOTE — ED Provider Notes (Addendum)
History     CSN: [id number]  Arrival date & time 12/08/11  0209   First MD Initiated Contact with Patient 12/08/11 [phone]      Chief Complaint  Patient presents with  . Shortness of Breath  . Chest Pain    (Consider location/radiation/quality/duration/timing/severity/associated sxs/prior treatment) Patient is a 56 y.o. female presenting with shortness of breath and chest pain. The history is provided by the patient and medical records.  Shortness of Breath  Associated symptoms include chest pain and shortness of breath. Pertinent negatives include no fever and no cough.  Chest Pain Primary symptoms include shortness of breath and palpitations. Pertinent negatives for primary symptoms include no fever, no cough, no abdominal pain, no nausea and no vomiting.  The palpitations also occurred with shortness of breath.  Pertinent negatives for associated symptoms include no diaphoresis.    the patient is a 56 year old, morbidly obese, female, who presents emergency department complaining of chest pain, shortness breath, and palpitations.  That began last night.  She said when her heart was racing.  She had chest pain.  She denies cough, nausea, vomiting, sweating.  She denies leg pain or swelling.  She has had this several times in the past, and he has been evaluated with a CAT scan for PE, which was normal as well as a Holter monitor, which showed evidence of PACs in a rare PVC, but no sustained tachycardia.  Presently, she states that she is asymptomatic.  She denies recent illness.  She states that she takes Cardizem 300 mg daily, and she has not missed.  Past Medical History  Diagnosis Date  . ASTHMA, PERSISTENT, MODERATE 04/03/2007  . ALLERGIC RHINITIS 04/03/2007  . Hypertension   . Morbid obesity   . Paroxysmal SVT (supraventricular tachycardia)     hx    Past Surgical History  Procedure Date  . Cholecystectomy 1995  . Appendectomy 1996  . Ovarian cyst removal 1970  . Ventral  hernia repair 2000  . Foot surgery 1995 / 1996    x2  . Pilonidal cyst excision 1975  . Skin cancer excision   . Tonsillectomy   . Cesarean section     x2    Family History  Problem Relation Age of Onset  . Diabetes    . Hypertension    . Stroke    . Hemophilia    . Breast cancer    . Cancer Mother   . Diabetes Mother   . Hypertension Mother   . Diabetes Father   . Hypertension Father   . Kidney failure Mother     History  Substance Use Topics  . Smoking status: Never Smoker   . Smokeless tobacco: Never Used  . Alcohol Use: No    OB History    Grav Para Term Preterm Abortions TAB SAB Ect Mult Living                  Review of Systems  Constitutional: Negative for fever and diaphoresis.  Respiratory: Positive for shortness of breath. Negative for cough and chest tightness.   Cardiovascular: Positive for chest pain and palpitations. Negative for leg swelling.  Gastrointestinal: Negative for nausea, vomiting and abdominal pain.  Skin: Negative for rash.  Neurological: Negative for headaches.  Psychiatric/Behavioral: Negative for confusion.  All other systems reviewed and are negative.    Allergies  Review of patient's allergies indicates no known allergies.  Home Medications   Current Outpatient Rx  Name Route Sig Dispense Refill  .  DILTIAZEM HCL ER COATED BEADS 300 MG PO CP24  TAKE 1 CAPSULE (300 MG TOTAL) BY MOUTH DAILY. 30 capsule 6  . HYDROCHLOROTHIAZIDE 25 MG PO TABS  TAKE 1 TABLET BY MOUTH EVERY DAY 30 tablet 7  . ALBUTEROL SULFATE HFA 108 (90 BASE) MCG/ACT IN AERS Inhalation Inhale 2 puffs into the lungs every 6 (six) hours as needed for wheezing. 1 Inhaler 2    BP 132/80  Pulse 78  Temp 97.5 F (36.4 C) (Oral)  Resp 24  SpO2 100%  Physical Exam  Nursing note and vitals reviewed. Constitutional: She is oriented to person, place, and time. No distress.       Morbidly obese  HENT:  Head: Normocephalic and atraumatic.  Eyes: Conjunctivae and  EOM are normal. Pupils are equal, round, and reactive to light.  Neck: Normal range of motion. Neck supple.  Cardiovascular: Normal rate.   No murmur heard. Pulmonary/Chest: Effort normal. No respiratory distress. She has no rales.  Abdominal: Soft. She exhibits no distension.  Musculoskeletal: Normal range of motion. She exhibits no edema.  Neurological: She is alert and oriented to person, place, and time.  Skin: Skin is warm and dry.  Psychiatric: She has a normal mood and affect. Thought content normal.    ED Course  Procedures (including critical care time) chest pain, resolved.  We'll perform chest x-ray, and cardiac enzyme test, for evaluation.   Labs Reviewed  POCT I-STAT TROPONIN I   Dg Chest Port 1 View  12/08/2011  *RADIOLOGY REPORT*  Clinical Data: Chest pain, shortness of breath.  PORTABLE CHEST - 1 VIEW  Comparison: 11/15/2011 CT  Findings: Heart size upper normal limits to mildly enlarged. Central vascular congestion.  Mild interstitial prominence.  No focal consolidation.  No definite pleural effusion.  No pneumothorax.  No acute osseous finding.  IMPRESSION: Cardiomegaly with central vascular congestion.  Interstitial prominence may be accentuated by patient body habitus. Mild edema or atypical infection not excluded.  Original Report Authenticated By: Waneta Martins, M.D.     No diagnosis found.  ECG A normal sinus rhythm at 72 beats per minute. Normal axis. Normal intervals. Normal.  ST and T waves.   MDM  Chest pain, resolved No evidence of ACS.  No pneumonia, or other significant illness.  At this time.        Cheri Guppy, MD 12/08/11 0981  Cheri Guppy, MD 01/06/12 [phone]

## 2011-12-10 ENCOUNTER — Telehealth: Payer: Self-pay | Admitting: Internal Medicine

## 2011-12-10 ENCOUNTER — Encounter: Payer: Self-pay | Admitting: Nurse Practitioner

## 2011-12-10 ENCOUNTER — Encounter: Payer: Self-pay | Admitting: Pulmonary Disease

## 2011-12-10 ENCOUNTER — Ambulatory Visit (INDEPENDENT_AMBULATORY_CARE_PROVIDER_SITE_OTHER): Payer: BC Managed Care – PPO | Admitting: Pulmonary Disease

## 2011-12-10 ENCOUNTER — Ambulatory Visit (INDEPENDENT_AMBULATORY_CARE_PROVIDER_SITE_OTHER): Payer: BC Managed Care – PPO | Admitting: Nurse Practitioner

## 2011-12-10 VITALS — BP 118/82 | HR 77 | Temp 97.7°F | Ht 68.0 in | Wt 326.8 lb

## 2011-12-10 VITALS — BP 130/88 | HR 73 | Ht 68.0 in | Wt 324.1 lb

## 2011-12-10 DIAGNOSIS — R0600 Dyspnea, unspecified: Secondary | ICD-10-CM

## 2011-12-10 DIAGNOSIS — R072 Precordial pain: Secondary | ICD-10-CM

## 2011-12-10 DIAGNOSIS — R0989 Other specified symptoms and signs involving the circulatory and respiratory systems: Secondary | ICD-10-CM

## 2011-12-10 DIAGNOSIS — R0609 Other forms of dyspnea: Secondary | ICD-10-CM

## 2011-12-10 DIAGNOSIS — R079 Chest pain, unspecified: Secondary | ICD-10-CM

## 2011-12-10 DIAGNOSIS — R0789 Other chest pain: Secondary | ICD-10-CM | POA: Insufficient documentation

## 2011-12-10 HISTORY — PX: US ECHOCARDIOGRAPHY: HXRAD669

## 2011-12-10 MED ORDER — OMEPRAZOLE 20 MG PO CPDR: 20.0000 mg | DELAYED_RELEASE_CAPSULE | Freq: Every day | ORAL | Status: DC

## 2011-12-10 NOTE — Patient Instructions (Addendum)
Your physician recommends that you schedule a follow-up appointment in: AS NEEDED PENDING TEST RESULTS  Your physician has requested that you have en exercise stress myoview. For further information please visit [email or web address] Please follow instruction sheet, as given.   START OMEPRAZOLE-PRILOSEC 20 MG ONCE DAILY

## 2011-12-10 NOTE — Assessment & Plan Note (Signed)
The patient has chronic dyspnea on exertion that she thinks has been worsening, but also describes these episodes of chest tightness with diaphoresis, anxiety, and shortness of breath.  It is unclear to me if these 2 different entities are related.  Her prior PFTs suggest asthma, but her acute episodes that she describes does not sound typical for an asthma flare.  I think we also need to consider whether there may be a cardiac etiology for this.  She has an upcoming echocardiogram scheduled, but may also need a stress test.  Also question whether it may be related to her history of SVT.  Her chronic dyspnea on exertion that she feels is worsening can be related to her obesity and deconditioning, as well as her history of airflow obstruction.  I will go ahead and start her on inhaled corticosteroids, but will avoid a LABA in light of her arrhythmia history.  I would like to see her back in 4 weeks to monitor her response, but also to check full pulmonary function studies.

## 2011-12-10 NOTE — Telephone Encounter (Signed)
Please return call to patient at [phone]  Pt seen in ER over the weekend, SOB, Chest Pain, told to F/U w/Marc[facility] ASAP.  Pt also c/o of the same symptoms overnight and would like to be seen today.  Please return call

## 2011-12-10 NOTE — Progress Notes (Signed)
  Subjective:    Patient ID: Kimberly Patton, female    DOB: 04-Jan-1956, 56 y.o.   MRN: [id number]  HPI The patient is a 56 year old female who I've been asked to see for dyspnea.  The patient has had chronic dyspnea on exertion since 2008, but she feels that it has worsened steadily over the years.  She also had describes acute episodes of shortness of breath starting last month that are associated with atypical chest pain, anxiety, and shortness of breath.  She has been seen in the ER twice with nothing being found.  She has had a CT of her chest last month which was not an ideal study, but did not show any pulmonary emboli or other acute process.  The patient has also had lower extremity venous Dopplers with no DVT noted.  The patient states that she breathes well while walking in water, but she believes this is because the water neutralizes her weight and central obesity.  She describes a 2 block dyspnea on exertion at a moderate pace on flat ground, and will get winded bringing groceries in from the car.  She does not get winded with light housework.  She denies any palpitations during these episodes.  She has minimal cough, and no mucus production.  She denies her lower extremity edema being worse than usual.  She does state that her weight is up 20 pounds during the month of June.  Of note, the patient had pulmonary function studies in 2009 that suggested air flow obstruction, but she was not started on a maintenance inhaler for everyday use.   Review of Systems  Constitutional: Negative for fever and unexpected weight change.  HENT: Negative for ear pain, nosebleeds, congestion, sore throat, rhinorrhea, sneezing, trouble swallowing, dental problem, postnasal drip and sinus pressure.   Eyes: Negative for redness and itching.  Respiratory: Positive for shortness of breath. Negative for cough, chest tightness and wheezing.   Cardiovascular: Positive for palpitations and leg swelling.    Gastrointestinal: Positive for abdominal pain. Negative for nausea and vomiting.  Genitourinary: Negative for dysuria.  Musculoskeletal: Negative for joint swelling.  Skin: Negative for rash.  Neurological: Positive for headaches.  Hematological: Does not bruise/bleed easily.  Psychiatric/Behavioral: Negative for dysphoric mood. The patient is not nervous/anxious.   All other systems reviewed and are negative.       Objective:   Physical Exam Constitutional:  Morbidly obese female, no acute distress  HENT:  Nares patent without discharge, but narrowed.  Oropharynx without exudate, palate and uvula are thickened and elongated.   Eyes:  Perrla, eomi, no scleral icterus  Neck:  No JVD, no TMG  Cardiovascular:  Normal rate, regular rhythm, no rubs or gallops.  1/6 sem        Intact distal pulses but diminished.  Pulmonary :  Normal breath sounds, no stridor or respiratory distress   No rales, rhonchi, or wheezing  Abdominal:  Soft, nondistended, bowel sounds present.  No tenderness noted.   Musculoskeletal:  + lower extremity edema noted.  Lymph Nodes:  No cervical lymphadenopathy noted  Skin:  No cyanosis noted  Neurologic:  Alert, appropriate, moves all 4 extremities without obvious deficit.         Assessment & Plan:

## 2011-12-10 NOTE — Patient Instructions (Addendum)
Will start on qvar  2 inhalations each am and pm everyday whether you think you need or not.  Rinse mouth well after using. Work on weight reduction and some type of exercise program. Will see you back in 4-5 weeks with breathing studies the same day.

## 2011-12-10 NOTE — Telephone Encounter (Signed)
Noted  

## 2011-12-10 NOTE — Telephone Encounter (Signed)
No need to return this call, scheduled to see Ward Givens Per Sue Lush

## 2011-12-10 NOTE — Progress Notes (Signed)
Patient Name: Kimberly Patton Date of Encounter: 12/10/2011  Primary Care Provider:  Kerby Nora, MD Primary Cardiologist:  Reece Agar. Ladona Ridgel, MD  Patient Profile  56 y/o white female with prior history of SVT who presents with a nearly one-month history of intermittent diaphoresis, chest pain, and dyspnea.  Problem List   Past Medical History  Diagnosis Date  . ASTHMA, PERSISTENT, MODERATE 04/03/2007  . ALLERGIC RHINITIS 04/03/2007  . Hypertension   . Morbid obesity   . Paroxysmal SVT (supraventricular tachycardia)     a. 11/2011 48h Holter: RSR, rare PVC's, occas PAC's  . Dyspnea on exertion     a. 11/2007 Echo: EF 60%.   Past Surgical History  Procedure Date  . Cholecystectomy 1995  . Appendectomy 1996  . Ovarian cyst removal 1970  . Ventral hernia repair 2000  . Foot surgery 1995 / 1996    x2  . Pilonidal cyst excision 1975  . Skin cancer excision   . Tonsillectomy   . Cesarean section     x2    Allergies  No Known Allergies  HPI  56 year old female with the above problem list.  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  After the ceremony, she was evaluated by EMS and found to have what sounds like PACs.  She was taken to the ED where monitoring and blood work was unrevealing and subsequently discharged and set up for a 48-hour Holter monitor.  This showed sinus rhythm with occasional PACs and rare PVCs.  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  This again persisted for several hours and resolved spontaneously.  She saw her primary care provider that afternoon with blood work being performed which was within normal limits.  Unfortunately, that evening, she had a recurrent episode and presented to the ED.  Despite prolonged  symptoms of diaphoresis, chest pain, and dyspnea, ECG was normal and enzymes were negative.  She was discharged from the emergency department and advised followup with cardiology.  She has not had any further spells over the weekend though she notes that she has been having a bad or metallic taste in her mouth and that sometimes she gets chest pressure and indigestion-like symptoms while lying in bed at night.  Home Medications  Prior to Admission medications   Medication Sig Start Date End Date Taking? Authorizing Provider  albuterol (PROVENTIL HFA;VENTOLIN HFA) 108 (90 BASE) MCG/ACT inhaler Inhale 2 puffs into the lungs every 6 (six) hours as needed for wheezing. 12/07/11 12/06/12 Yes Eustaquio Boyden, MD  diltiazem (CARDIZEM CD) 300 MG 24 hr capsule TAKE 1 CAPSULE (300 MG TOTAL) BY MOUTH DAILY. 11/08/11  Yes Amy E Bedsole, MD  hydrochlorothiazide (HYDRODIURIL) 25 MG tablet TAKE 1 TABLET BY MOUTH EVERY DAY 10/06/11  Yes Amy E Bedsole, MD  omeprazole (PRILOSEC) 20 MG capsule Take 1 capsule (20 mg total) by mouth daily. 12/10/11 12/09/12  Ok Anis, NP    Review of Systems  Diaphoresis, chest pressure, dyspnea, metallic taste in her mouth, nocturnal chest burning/indigestion, intermittent nausea as outlined in the history of present illness. All other systems reviewed and are otherwise negative except as noted above.  Physical Exam  Blood pressure 130/88, pulse 73, height 5\' 8"  (1.727 m), weight 324 lb 1.9 oz (  147.02 kg).  General: Pleasant, NAD Psych: Normal affect. Neuro: Alert and oriented X 3. Moves all extremities spontaneously. HEENT: Normal  Neck: Supple without bruits or JVD. Lungs:  Resp regular and unlabored, CTA. Heart: RRR no s3, s4, or murmurs. Abdomen: Soft, non-tender, non-distended, BS + x 4.  Extremities: No clubbing, cyanosis or edema. DP/PT/Radials 2+ and equal bilaterally.  Accessory Clinical Findings  ECG - sinus rhythm, 75, no acute ST or T  changes.  Assessment & Plan  1.  Mid sternal chest pain: Patient has nearly a one-month history of intermittent midsternal chest discomfort in the setting of marked diaphoresis and dyspnea.  This is been evaluated in the emergency room twice with normal enzymes on both occasions despite prolonged symptoms.  I will obtain an exercise Myoview to rule out ischemia and hopefully provide some more reassurance.  In questioning her further, she has had a sour/metallic taste in the back of her mouth and often has a burning sensation in her epigastric area and chest while lying in bed at night.  This is similar sensation to what occurs during the day associated with diaphoresis as outlined above.  This being the case, we advise that she begin omeprazole OTC 20 mg daily.  2.  Dyspnea: As above, patient has been expressing dyspnea in the setting of these spells.  She has an echocardiogram scheduled for next Monday that was previously ordered by her PCP.  3.  History of SVT: Patient reports that in the past she has had very symptomatic tachycardia and palpitations which she can identify as being her SVT however in the setting of these spells recently she has had no palpitations.  She has been monitored on 2 occasions during these spells and has been noted to have occasional PACs and even were 48 hour Holter monitor which showed the same.  It does not sound as though her symptoms are a result of tachycardia or SVT and we are both in agreement that at this point more monitoring would is not necessary.  4.  Disposition: If Myoview and echo return and are normal, followup p.r.n. If she has relief with PPI therapy, consider GI referral.   Nicolasa Ducking, NP 12/10/2011, 5:04 PM

## 2011-12-17 ENCOUNTER — Other Ambulatory Visit (HOSPITAL_COMMUNITY): Payer: Self-pay | Admitting: Surgery

## 2011-12-17 ENCOUNTER — Other Ambulatory Visit: Payer: Self-pay

## 2011-12-17 ENCOUNTER — Other Ambulatory Visit (INDEPENDENT_AMBULATORY_CARE_PROVIDER_SITE_OTHER): Payer: BC Managed Care – PPO

## 2011-12-17 DIAGNOSIS — R0609 Other forms of dyspnea: Secondary | ICD-10-CM

## 2011-12-17 DIAGNOSIS — R0989 Other specified symptoms and signs involving the circulatory and respiratory systems: Secondary | ICD-10-CM

## 2011-12-17 DIAGNOSIS — K469 Unspecified abdominal hernia without obstruction or gangrene: Secondary | ICD-10-CM

## 2011-12-17 DIAGNOSIS — R0600 Dyspnea, unspecified: Secondary | ICD-10-CM

## 2011-12-18 ENCOUNTER — Ambulatory Visit (HOSPITAL_COMMUNITY): Payer: BC Managed Care – PPO | Attending: Nurse Practitioner | Admitting: Radiology

## 2011-12-18 ENCOUNTER — Encounter: Payer: Self-pay | Admitting: Family Medicine

## 2011-12-18 VITALS — BP 112/57 | Ht 68.0 in | Wt 318.0 lb

## 2011-12-18 DIAGNOSIS — R0989 Other specified symptoms and signs involving the circulatory and respiratory systems: Secondary | ICD-10-CM | POA: Insufficient documentation

## 2011-12-18 DIAGNOSIS — R61 Generalized hyperhidrosis: Secondary | ICD-10-CM | POA: Insufficient documentation

## 2011-12-18 DIAGNOSIS — R002 Palpitations: Secondary | ICD-10-CM

## 2011-12-18 DIAGNOSIS — I10 Essential (primary) hypertension: Secondary | ICD-10-CM | POA: Insufficient documentation

## 2011-12-18 DIAGNOSIS — R079 Chest pain, unspecified: Secondary | ICD-10-CM | POA: Insufficient documentation

## 2011-12-18 DIAGNOSIS — R110 Nausea: Secondary | ICD-10-CM | POA: Insufficient documentation

## 2011-12-18 DIAGNOSIS — R0600 Dyspnea, unspecified: Secondary | ICD-10-CM

## 2011-12-18 DIAGNOSIS — R0602 Shortness of breath: Secondary | ICD-10-CM

## 2011-12-18 DIAGNOSIS — R0789 Other chest pain: Secondary | ICD-10-CM

## 2011-12-18 DIAGNOSIS — J45909 Unspecified asthma, uncomplicated: Secondary | ICD-10-CM | POA: Insufficient documentation

## 2011-12-18 DIAGNOSIS — R0609 Other forms of dyspnea: Secondary | ICD-10-CM | POA: Insufficient documentation

## 2011-12-18 DIAGNOSIS — R42 Dizziness and giddiness: Secondary | ICD-10-CM | POA: Insufficient documentation

## 2011-12-18 MED ORDER — TECHNETIUM TC 99M TETROFOSMIN IV KIT
33.0000 | PACK | Freq: Once | INTRAVENOUS | Status: AC | PRN
  Administered 2011-12-18: 33 via INTRAVENOUS

## 2011-12-18 MED ORDER — REGADENOSON 0.4 MG/5ML IV SOLN
0.4000 mg | Freq: Once | INTRAVENOUS | Status: AC
  Administered 2011-12-18: 0.4 mg via INTRAVENOUS

## 2011-12-18 NOTE — Progress Notes (Signed)
[facility] SITE 3 NUCLEAR MED [address] [place] [address] [phone]  Cardiology Nuclear Med Study  Kimberly Patton is a 56 y.o. female     MRN : [id number]     DOB: 11-Mar-1956  Procedure Date: 12/18/2011  Nuclear Med Background Indication for Stress Test:  Evaluation for Ischemia History:  Asthma and 2009 ECHO: EF: 60% Cardiac Risk Factors: Hypertension  Symptoms:  Chest Pain, Diaphoresis, Dizziness, DOE and Nausea   Nuclear Pre-Procedure Caffeine/Decaff Intake:  None > 12 hrs NPO After: 8:00pm   Lungs:  clear O2 Sat: 99% on room air. IV 0.9% NS with Angio Cath:  22g  IV Site: R Antecubital x 1, tolerated well IV Started by:  Irean Hong, RN  Chest Size (in):  48 Cup Size: DDD  Height: 5\' 8"  (1.727 m)  Weight:  318 lb (144.244 kg)  BMI:  Body mass index is 48.35 kg/(m^2). Tech Comments:  n/a    Nuclear Med Study 1 or 2 day study: 2 day  Stress Test Type:  Treadmill/Lexiscan  Reading MD: Marca Ancona, MD  Order Authorizing Provider:  Lewayne Bunting, MD, and Ward Givens, NP  Resting Radionuclide: Technetium 43m Tetrofosmin  Resting Radionuclide Dose: 33.0 mCi on 12/19/11   Stress Radionuclide:  Technetium 90m Tetrofosmin  Stress Radionuclide Dose: 33.0 mCi on 12/18/11           Stress Protocol Rest HR: 58 Stress HR: 131  Rest BP: 112/57 Stress BP: 142/72  Exercise Time (min): n/a METS: n/a   Predicted Max HR: 164 bpm % Max HR: 79.88 bpm Rate Pressure Product: 64403   Dose of Adenosine (mg):  n/a Dose of Lexiscan: 0.4 mg  Dose of Atropine (mg): n/a Dose of Dobutamine: n/a mcg/kg/min (at max HR)  Stress Test Technologist: Milana Na, EMT-P  Nuclear Technologist:  Domenic Polite, CNMT     Rest Procedure:  Myocardial perfusion imaging was performed at rest 45 minutes following the intravenous administration of Technetium 61m Tetrofosmin. Rest ECG: NSR - Normal EKG  Stress Procedure:  The patient received IV Lexiscan 0.4 mg over  15-seconds with concurrent low level exercise and then Technetium 49m Tetrofosmin was injected at 30-seconds while the patient continued walking one more minute. There were no significant changes and chest pain with Lexiscan. Quantitative spect images were obtained after a 45-minute delay. Stress ECG: No significant change from baseline ECG  QPS Raw Data Images:  Normal; no motion artifact; normal heart/lung ratio. Stress Images:  Normal homogeneous uptake in all areas of the myocardium. Rest Images:  Normal homogeneous uptake in all areas of the myocardium. Subtraction (SDS):  Normal Transient Ischemic Dilatation (Normal <1.22):  0.93 Lung/Heart Ratio (Normal <0.45):  0.39  Quantitative Gated Spect Images QGS EDV:  102 ml QGS ESV:  34 ml  Impression Exercise Capacity:  Lexiscan with low level exercise. BP Response:  Normal blood pressure response. Clinical Symptoms:  Atypical chest pain. ECG Impression:  No significant ST segment change suggestive of ischemia. Comparison with Prior Nuclear Study: No images to compare  Overall Impression:  Normal stress nuclear study.  LV Ejection Fraction: 67%.  LV Wall Motion:  NL LV Function; NL Wall Motion  Charlton Haws

## 2011-12-19 ENCOUNTER — Ambulatory Visit (HOSPITAL_COMMUNITY): Payer: BC Managed Care – PPO | Attending: Cardiovascular Disease

## 2011-12-19 DIAGNOSIS — R0989 Other specified symptoms and signs involving the circulatory and respiratory systems: Secondary | ICD-10-CM

## 2011-12-19 MED ORDER — TECHNETIUM TC 99M TETROFOSMIN IV KIT
33.0000 | PACK | Freq: Once | INTRAVENOUS | Status: AC | PRN
  Administered 2011-12-19: 33 via INTRAVENOUS

## 2011-12-24 ENCOUNTER — Ambulatory Visit (HOSPITAL_COMMUNITY)
Admission: RE | Admit: 2011-12-24 | Discharge: 2011-12-24 | Disposition: A | Discharge status: Home / Self Care | Payer: BC Managed Care – PPO | Source: Ambulatory Visit | Attending: Surgery | Admitting: Surgery

## 2011-12-24 DIAGNOSIS — K469 Unspecified abdominal hernia without obstruction or gangrene: Secondary | ICD-10-CM

## 2011-12-24 DIAGNOSIS — R109 Unspecified abdominal pain: Secondary | ICD-10-CM | POA: Insufficient documentation

## 2011-12-24 MED ORDER — IOHEXOL 300 MG/ML  SOLN
100.0000 mL | Freq: Once | INTRAMUSCULAR | Status: AC | PRN
  Administered 2011-12-24: 100 mL via INTRAVENOUS

## 2012-01-01 ENCOUNTER — Telehealth: Payer: Self-pay | Admitting: Internal Medicine

## 2012-01-01 NOTE — Telephone Encounter (Signed)
Will fax over stress test results,echo, office note and EKG.  If MD needs more information then will have to call us back.  Patient aware Dr Ladona Ridgel not here this week

## 2012-01-01 NOTE — Telephone Encounter (Signed)
New Problem:    Called in needing a surgical clearance for hernia repair faxed into Dr. Marylene Land with [facility].  Please call back.

## 2012-01-01 NOTE — Telephone Encounter (Signed)
Stress,Echo,12,LOV (C.Berge) faxed to Carla/Dr.T.Hanniford @ [facility] @ [phone] 01/01/12/KM

## 2012-01-18 ENCOUNTER — Encounter: Payer: Self-pay | Admitting: Pulmonary Disease

## 2012-01-18 ENCOUNTER — Ambulatory Visit (INDEPENDENT_AMBULATORY_CARE_PROVIDER_SITE_OTHER): Payer: BC Managed Care – PPO | Admitting: Pulmonary Disease

## 2012-01-18 VITALS — BP 124/80 | HR 73 | Temp 97.9°F | Ht 67.0 in | Wt 307.0 lb

## 2012-01-18 DIAGNOSIS — R0989 Other specified symptoms and signs involving the circulatory and respiratory systems: Secondary | ICD-10-CM

## 2012-01-18 DIAGNOSIS — J45909 Unspecified asthma, uncomplicated: Secondary | ICD-10-CM

## 2012-01-18 DIAGNOSIS — R0609 Other forms of dyspnea: Secondary | ICD-10-CM

## 2012-01-18 DIAGNOSIS — R0600 Dyspnea, unspecified: Secondary | ICD-10-CM

## 2012-01-18 LAB — PULMONARY FUNCTION TEST

## 2012-01-18 NOTE — Assessment & Plan Note (Signed)
I suspect the majority of the patient's dyspnea on exertion is related to her morbid obesity and deconditioning.  We will treat her airflow obstruction more aggressively, but I stressed to her the importance of aggressive weight loss and a conditioning program.  I see no reason why she cannot have her planned upcoming surgery.  Will send a note to her surgeon.

## 2012-01-18 NOTE — Progress Notes (Signed)
  Subjective:    Patient ID: Kimberly Patton, female    DOB: 10-24-1955, 56 y.o.   MRN: [id number]  HPI The patient comes in today for followup of her pulmonary function studies.  These were done to evaluate dyspnea on exertion.  She was found to have mild airflow obstruction, but a 16% improvement with bronchodilators.  There was no restriction, but air trapping was noted.  She also had a mild reduction in her diffusion capacity, most likely due to her morbid obesity.  I have reviewed the study with her in detail, and answered all of her questions.   Review of Systems  Constitutional: Negative for fever and unexpected weight change.  HENT: Negative for ear pain, nosebleeds, congestion, sore throat, rhinorrhea, sneezing, trouble swallowing, dental problem, postnasal drip and sinus pressure.   Eyes: Positive for pain. Negative for redness and itching.  Respiratory: Positive for shortness of breath. Negative for cough, chest tightness and wheezing.   Cardiovascular: Negative for palpitations and leg swelling.  Gastrointestinal: Negative for nausea and vomiting.  Genitourinary: Negative for dysuria.  Musculoskeletal: Negative for joint swelling.  Skin: Negative for rash.  Neurological: Negative for headaches.  Hematological: Does not bruise/bleed easily.  Psychiatric/Behavioral: Negative for dysphoric mood. The patient is not nervous/anxious.   All other systems reviewed and are negative.       Objective:   Physical Exam Obese female in no acute distress Nose without purulence or discharge noted Chest totally clear to auscultation Lower extremities without edema, cyanosis Alert and oriented, moves all 4 extremities.       Assessment & Plan:

## 2012-01-18 NOTE — Patient Instructions (Addendum)
Stop qvar. Start dulera 100/5  2 inhalations am and pm everday no matter how you feel.  Rinse mouth out well after using. Work on weight loss and some type of exercise program. followup with me in 4mos if doing better, and will call in a prescription for the dulera if you do well with the medication.  Please call to give me an update with how things are going with the dulera.

## 2012-01-18 NOTE — Assessment & Plan Note (Signed)
The patient has mild airflow obstruction on her PFTs today, along with air trapping.  She did have a 16% improvement in FEV1 with bronchodilators.  Since she did not see a day and improvement with the Qvar, I would like to start her on a LABA/ICS for treatment.  She will need to monitor her her history of tachycardia.

## 2012-01-18 NOTE — Progress Notes (Signed)
PFT Done. Artesia Berkey, CMA  

## 2012-01-30 ENCOUNTER — Encounter: Payer: Self-pay | Admitting: Pulmonary Disease

## 2012-02-25 ENCOUNTER — Telehealth: Payer: Self-pay | Admitting: Pulmonary Disease

## 2012-02-25 MED ORDER — MOMETASONE FURO-FORMOTEROL FUM 100-5 MCG/ACT IN AERO: 2.0000 | INHALATION_SPRAY | Freq: Two times a day (BID) | RESPIRATORY_TRACT | Status: DC

## 2012-02-25 NOTE — Telephone Encounter (Signed)
Ok with me to send in script.

## 2012-02-25 NOTE — Telephone Encounter (Signed)
I spoke with pt and she stated the dulera has helped her a lot more than the QVAR did. Her breathing is much better and can tell a difference since being on this medication. She would like an RX called into [facility]. Will forward to [facility] since he did want an update on how she was doing while on dulera. Please advise Dr. Shelle Iron thanks

## 2012-02-25 NOTE — Telephone Encounter (Signed)
I spoke with pt and is aware RX for dulera 100 has been sent to the pharmacy. Nothing further was needed

## 2012-03-12 ENCOUNTER — Other Ambulatory Visit: Payer: Self-pay | Admitting: Cardiology

## 2012-03-12 NOTE — Telephone Encounter (Signed)
error 

## 2012-03-13 ENCOUNTER — Other Ambulatory Visit: Payer: Self-pay | Admitting: Nurse Practitioner

## 2012-03-19 ENCOUNTER — Telehealth: Payer: Self-pay

## 2012-03-19 DIAGNOSIS — I10 Essential (primary) hypertension: Secondary | ICD-10-CM

## 2012-03-19 NOTE — Telephone Encounter (Signed)
Pt left v/m requesting last lab results cholesterol HDL and LDL and last BP for herself and husband Jayva Szymkowiak. Do not see where pt has had recent lipid and cannot release info DPR not signed on Mr Onderko chart. Left v/m for pt to call back.

## 2012-03-20 ENCOUNTER — Other Ambulatory Visit: Payer: Self-pay | Admitting: Family Medicine

## 2012-03-20 DIAGNOSIS — Z1231 Encounter for screening mammogram for malignant neoplasm of breast: Secondary | ICD-10-CM

## 2012-03-20 NOTE — Telephone Encounter (Signed)
Left message on home answering machine that pt can call to schedule lab appt.

## 2012-03-20 NOTE — Telephone Encounter (Signed)
Okay to make a lab  appt.

## 2012-03-20 NOTE — Telephone Encounter (Signed)
Pt needs lipid panel for wellness assessment form for insurance purposes. Pt request lab test for lipid to be scheduled at 7:30 any morning except 03/21/12.Please advise.

## 2012-03-25 ENCOUNTER — Other Ambulatory Visit (INDEPENDENT_AMBULATORY_CARE_PROVIDER_SITE_OTHER): Payer: BC Managed Care – PPO

## 2012-03-25 DIAGNOSIS — I10 Essential (primary) hypertension: Secondary | ICD-10-CM

## 2012-03-26 LAB — LIPID PANEL
Cholesterol: 182 mg/dL (ref 0–200)
HDL: 48.8 mg/dL (ref >39.00)
LDL Cholesterol: 116 mg/dL — ABNORMAL HIGH (ref 0–99)
Total CHOL/HDL Ratio: 4
Triglycerides: 86 mg/dL (ref 0.0–149.0)
VLDL: 17.2 mg/dL (ref 0.0–40.0)

## 2012-03-28 ENCOUNTER — Telehealth: Payer: Self-pay | Admitting: Family Medicine

## 2012-03-28 ENCOUNTER — Ambulatory Visit
Admission: RE | Admit: 2012-03-28 | Discharge: 2012-03-28 | Disposition: A | Discharge status: Home / Self Care | Payer: BC Managed Care – PPO | Source: Ambulatory Visit | Attending: Family Medicine | Admitting: Family Medicine

## 2012-03-28 DIAGNOSIS — Z1231 Encounter for screening mammogram for malignant neoplasm of breast: Secondary | ICD-10-CM

## 2012-03-28 NOTE — Telephone Encounter (Signed)
Caller: [name]/Patient; Patient Name: Kimberly Patton; PCP: [facility]); Best Callback Phone Number: [phone] States has not had menstrual cycle in 18 years. 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 Has noticed spotting and feels like a usual menstrual cycle. Spot of blood was size of "quarter". Denies cramping. Advised per Vaginal Bleeding No Hormone Therapy protocol, needs to be seen 10-21 due to vaginal spotting and 1 year post menopausal. Appointment scheduled for 10-21 at 0815.

## 2012-03-28 NOTE — Telephone Encounter (Signed)
Agree with appt. 

## 2012-03-31 ENCOUNTER — Telehealth: Payer: Self-pay | Admitting: Family Medicine

## 2012-03-31 ENCOUNTER — Ambulatory Visit: Payer: BC Managed Care – PPO | Admitting: Family Medicine

## 2012-03-31 ENCOUNTER — Encounter: Payer: Self-pay | Admitting: *Deleted

## 2012-03-31 NOTE — Telephone Encounter (Signed)
To: [facility] (After Hours Triage) Fax: [phone] From: [facility] Date/ Time: 03/29/2012 8:32 AM Taken By: [place] Trimble, CSR Caller: Arline Asp Facility: not collected Patient: [name], [name] DOB: 01-11-1956 Phone: [phone] Reason for Call: See info below Regarding Appointment: Yes Appt Date: 03/31/2012 Appt Time: 8:15:00 AM Provider: Ruthe Mannan (Family Practice) Reason: Cancel Appointment Details: pt is out of town and will not be able to make appt

## 2012-03-31 NOTE — Telephone Encounter (Signed)
Fax received for a prior authorization on this patient's omeprazole. This pt was originally prescribed the omeprazole by Dr Brion Aliment (cardio)  on 12/10/11. Is it ok for our office to start the auth process, and will you complete the paperwork or should we forward the request to Dr Brion Aliment?

## 2012-04-01 NOTE — Telephone Encounter (Signed)
Pt is still seeing Dr Brion Aliment, I will route this note and the faxed pharmacy request form pharmacy to him.

## 2012-04-01 NOTE — Telephone Encounter (Signed)
If pt still seeing Dr. Brion Aliment.. Send to him, if not we can start process.

## 2012-04-02 NOTE — Telephone Encounter (Signed)
As I neither have a regular clinic, nor a regular patient base, Ms Miltenberger, is not a patient of mine.  She was an add-on to my schedule back in July and has previously seen Sharrell Ku in our practice, however her chest pain is non-cardiac.  It looks like I advised she use Prilosec OTC, so it's not clear why she would need prior authorization for an over the counter med.  As we will not be managing her noncardiac chest pain, I would defer to Dr. Ermalene Searing, as to how she would like to proceed with her PPI therapy.

## 2012-04-07 ENCOUNTER — Encounter: Payer: Self-pay | Admitting: Family Medicine

## 2012-04-07 ENCOUNTER — Ambulatory Visit (INDEPENDENT_AMBULATORY_CARE_PROVIDER_SITE_OTHER): Payer: BC Managed Care – PPO | Admitting: Family Medicine

## 2012-04-07 ENCOUNTER — Other Ambulatory Visit (HOSPITAL_COMMUNITY)
Admission: RE | Admit: 2012-04-07 | Discharge: 2012-04-07 | Disposition: A | Discharge status: Home / Self Care | Payer: BC Managed Care – PPO | Source: Ambulatory Visit | Attending: Family Medicine | Admitting: Family Medicine

## 2012-04-07 VITALS — BP 146/90 | HR 60 | Temp 97.4°F | Wt 330.0 lb

## 2012-04-07 DIAGNOSIS — Z01419 Encounter for gynecological examination (general) (routine) without abnormal findings: Secondary | ICD-10-CM | POA: Insufficient documentation

## 2012-04-07 DIAGNOSIS — Z1151 Encounter for screening for human papillomavirus (HPV): Secondary | ICD-10-CM | POA: Insufficient documentation

## 2012-04-07 DIAGNOSIS — N950 Postmenopausal bleeding: Secondary | ICD-10-CM

## 2012-04-07 NOTE — Progress Notes (Signed)
Subjective:    Patient ID: Kimberly Patton, female    DOB: August 08, 1955, 56 y.o.   MRN: [id number]  HPI  56 yo pt of Dr. [name] here for post menopausal bleeding.  She would also like a pap smear.  3 days ago, was driving and had some vaginal bleeding which she has not had in 15 years.  Mild cramping.  Actually went through an entire pad.  Bleeding has stopped but still having dark brown discharge.  Never had anything like this before.  LMP ? 15 years ago.  She states that she also has a very large uterine fibroid.    She is not taking any hormones and never has. No family h/o uterine, ovarian or cervical CA.  She states that she was supposed to have a hysterectomy for her fibroid years ago but stated that she was not a surgical candidate due to large hernia with mesh.  Patient Active Problem List  Diagnosis  . FIBROIDS, UTERUS  . SUPRAVENTRICULAR TACHYCARDIA  . PHARYNGITIS, PERSISTENT  . ALLERGIC RHINITIS  . Asthma, intrinsic  . HERNIA  . CONTACT DERMATITIS&OTHER ECZEMA DUE TO PLANTS  . DERMATITIS, CONTACT, NOS  . PETECHIAE  . EDEMA- LOCALIZED  . Palpitations  . Varicose veins  . Varicose veins of lower extremities with other complications  . HTN (hypertension)  . Dyspnea  . Splenomegaly  . Midsternal chest pain  . Post-menopausal bleeding  . Visit for gynecologic examination   Past Medical History  Diagnosis Date  . ASTHMA, PERSISTENT, MODERATE 04/03/2007  . ALLERGIC RHINITIS 04/03/2007  . Hypertension   . Morbid obesity   . Paroxysmal SVT (supraventricular tachycardia)     a. 11/2011 48h Holter: RSR, rare PVC's, occas PAC's  . Dyspnea on exertion     a. 11/2007 Echo: EF 60%.   Past Surgical History  Procedure Date  . Cholecystectomy 1995  . Appendectomy 1996  . Ovarian cyst removal 1970  . Ventral hernia repair 2000  . Foot surgery 1995 / 1996    x2  . Pilonidal cyst excision 1975  . Skin cancer excision   . Tonsillectomy   . Cesarean section     x2    . US echocardiography 12/2011    WNL - EF 55-60%, mild MR, grade 1 diastolic dysnfiction (mild)   History  Substance Use Topics  . Smoking status: Never Smoker   . Smokeless tobacco: Never Used  . Alcohol Use: No   Family History  Problem Relation Age of Onset  . Diabetes    . Hypertension    . Stroke    . Hemophilia    . Breast cancer Mother   . Diabetes Mother   . Hypertension Mother   . Diabetes Father   . Hypertension Father   . Kidney failure Mother    No Known Allergies Current Outpatient Prescriptions on File Prior to Visit  Medication Sig Dispense Refill  . albuterol (PROVENTIL HFA;VENTOLIN HFA) 108 (90 BASE) MCG/ACT inhaler Inhale 2 puffs into the lungs every 6 (six) hours as needed for wheezing.  1 Inhaler  2  . beclomethasone (QVAR) 80 MCG/ACT inhaler Inhale 1 puff into the lungs 2 (two) times daily.      Marland Kitchen diltiazem (CARDIZEM CD) 300 MG 24 hr capsule TAKE 1 CAPSULE (300 MG TOTAL) BY MOUTH DAILY.  30 capsule  6  . hydrochlorothiazide (HYDRODIURIL) 25 MG tablet TAKE 1 TABLET BY MOUTH EVERY DAY  30 tablet  7  . mometasone-formoterol (DULERA) 100-5  MCG/ACT AERO Inhale 2 puffs into the lungs 2 (two) times daily.  1 Inhaler  6  . omeprazole (PRILOSEC) 20 MG capsule Take 1 capsule (20 mg total) by mouth daily.  30 capsule  11   The PMH, PSH, Social History, Family History, Medications, and allergies have been reviewed in [facility], and have been updated if relevant.  Review of Systems See HPI   Objective:   Physical Exam BP 146/90  Pulse 60  Temp 97.4 F (36.3 C)  Wt 330 lb (149.687 kg)  General:  Obese,Well-developed,well-nourished,in no acute distress; alert,appropriate and cooperative throughout examination Rectal:  no external abnormalities.   Genitalia:  Pelvic Exam:        External: normal female genitalia without lesions or masses        Vagina: normal without lesions or masses        Cervix: small stalk lesion protruding from cervix- ?polyp vs clotted blood              Pap smear: performed  Neurologic:  alert & oriented X3 and gait normal.   Skin:  Intact without suspicious lesions or rashes Psych:  Cognition and judgment appear intact. Alert and cooperative with normal attention span and concentration. No apparent delusions, illusions, hallucinations        Assessment & Plan:   1. Post-menopausal bleeding  With ? Polyp on exam. Pap smear performed.  Will refer to GYN for further work up- i.e endometrial biopsy,etc. The patient indicates understanding of these issues and agrees with the plan.  Ambulatory referral to Gynecology  2. Visit for gynecologic examination

## 2012-04-07 NOTE — Addendum Note (Signed)
Addended by: Eliezer Bottom on: 04/07/2012 10:33 AM   Modules accepted: Orders

## 2012-04-07 NOTE — Patient Instructions (Addendum)
It was nice to meet you. Please stop by to see Kimberly Patton on your way out to set up your gynecology referral.  We will call you or send a letter with your pap smear results.

## 2012-04-08 ENCOUNTER — Telehealth: Payer: Self-pay | Admitting: *Deleted

## 2012-04-08 NOTE — Telephone Encounter (Signed)
[facility] refaxed prior auth forms back, they faxed another form requiring additional information. I will put forms in your inbox.

## 2012-04-15 ENCOUNTER — Encounter: Payer: Self-pay | Admitting: *Deleted

## 2012-04-15 ENCOUNTER — Encounter: Payer: Self-pay | Admitting: Family Medicine

## 2012-04-15 LAB — HM PAP SMEAR: HM Pap smear: NORMAL

## 2012-04-21 ENCOUNTER — Encounter: Payer: Self-pay | Admitting: Family Medicine

## 2012-04-21 ENCOUNTER — Ambulatory Visit (INDEPENDENT_AMBULATORY_CARE_PROVIDER_SITE_OTHER): Payer: BC Managed Care – PPO | Admitting: Family Medicine

## 2012-04-21 VITALS — BP 119/84 | HR 77 | Ht 68.0 in | Wt 330.0 lb

## 2012-04-21 DIAGNOSIS — N841 Polyp of cervix uteri: Secondary | ICD-10-CM

## 2012-04-21 DIAGNOSIS — N950 Postmenopausal bleeding: Secondary | ICD-10-CM

## 2012-04-21 NOTE — Patient Instructions (Signed)
Endometrial Biopsy This is a test in which a tissue sample (a biopsy) is taken from inside the uterus (womb). It is then looked at by a specialist under a microscope to see if the tissue is normal or abnormal. The endometrium is the lining of the uterus. This test helps determine where you are in your menstrual cycle and how hormone levels are affecting the lining of the uterus. Another use for this test is to diagnose endometrial cancer, tuberculosis, polyps, or inflammatory conditions and to evaluate uterine bleeding. PREPARATION FOR TEST No preparation or fasting is necessary. NORMAL FINDINGS No pathologic conditions. Presence of "secretory-type" endometrium 3 to 5 days before to normal menstruation. Ranges for normal findings may vary among different laboratories and hospitals. You should always check with your doctor after having lab work or other tests done to discuss the meaning of your test results and whether your values are considered within normal limits. MEANING OF TEST  Your caregiver will go over the test results with you and discuss the importance and meaning of your results, as well as treatment options and the need for additional tests if necessary. OBTAINING THE TEST RESULTS It is your responsibility to obtain your test results. Ask the lab or department performing the test when and how you will get your results. Document Released: 09/28/2004 Document Revised: 08/20/2011 Document Reviewed: 05/07/2008 [facility] Patient Information 2013 [place], [place]. Postmenopausal Bleeding Menopause is commonly referred to as the "change in life." It is a time when the fertile years, the time of ovulating and having menstrual periods, has come to an end. It is also determined by not having menstrual periods for 12 months.  Postmenopausal bleeding is any bleeding a woman has after she has entered into menopause. Any type of postmenopausal bleeding, even if it appears to be a typical menstrual  period, is concerning. This should be evaluated by your caregiver.  CAUSES   Hormone therapy.  Cancer of the cervix or cancer of the lining of the uterus (endometrial cancer).  Thinning of the uterine lining (uterine atrophy).  Thyroid diseases.  Certain medicines.  Infection of the uterus or cervix.  Inflammation or irritation of the uterine lining (endometritis).  Estrogen-secreting tumors.  Growths (polyps) on the cervix, uterine lining, or uterus.  Uterine tumors (fibroids).  Being very overweight (obese). DIAGNOSIS  Your caregiver will take a medical history and ask questions. A physical exam will also be performed. Further tests may include:   A transvaginal ultrasound. An ultrasound wand or probe is inserted into your vagina to view the pelvic organs.  A biopsy of the lining of the uterus (endometrium). A sample of the endometrium is removed and examined.  A hysteroscopy. Your caregiver may use an instrument with a light and a camera attached to it (hysteroscope). The hysteroscope is used to look inside the uterus for problems.  A dilation and curettage (D&C). Tissue is removed from the uterine lining to be examined for problems. TREATMENT  Treatment depends on the cause of the bleeding. Some treatments include:   Surgery.  Medicines.  Hormones.  A hysteroscopy or D&C to remove polyps or fibroids.  Changing or stopping a current medicine you are taking. Talk to your caregiver about your specific treatment. HOME CARE INSTRUCTIONS   Maintain a healthy weight.  Keep regular pelvic exams and Pap tests. SEEK MEDICAL CARE IF:   You have bleeding, even if it is light in comparison to your previous periods.  Your bleeding lasts more than 1 week.  You have abdominal pain.  You develop bleeding with sexual intercourse. SEEK IMMEDIATE MEDICAL CARE IF:   You have a fever, chills, headache, dizziness, muscle aches, and bleeding.  You have severe pain with  bleeding.  You are passing blood clots.  You have bleeding and need more than 1 pad an hour.  You feel faint. MAKE SURE YOU:  Understand these instructions.  Will watch your condition.  Will get help right away if you are not doing well or get worse. Document Released: 09/05/2005 Document Revised: 08/20/2011 Document Reviewed: 02/01/2011 [facility] Patient Information 2013 [place], [place].

## 2012-04-22 NOTE — Progress Notes (Signed)
Subjective:    Patient ID: Kimberly Patton, female    DOB: 1955/10/13, 56 y.o.   MRN: [id number]  HPI  Referred in from [facility] for postmenopausal bleeding.  No cycle x 15 years.  Had some bright red bleeding in the last few months, once.  It has now stopped.  Had normal pap earlier this year with PCP.  Denies pain, bloating or vaginal discharge.  Past Medical History  Diagnosis Date  . ASTHMA, PERSISTENT, MODERATE 04/03/2007  . ALLERGIC RHINITIS 04/03/2007  . Hypertension   . Morbid obesity   . Paroxysmal SVT (supraventricular tachycardia)     a. 11/2011 48h Holter: RSR, rare PVC's, occas PAC's  . Dyspnea on exertion     a. 11/2007 Echo: EF 60%.   Past Surgical History  Procedure Date  . Cholecystectomy 1995  . Appendectomy 1996  . Ovarian cyst removal 1970  . Ventral hernia repair 2000  . Foot surgery 1995 / 1996    x2  . Pilonidal cyst excision 1975  . Skin cancer excision   . Tonsillectomy   . Cesarean section     x2  . US echocardiography 12/2011    WNL - EF 55-60%, mild MR, grade 1 diastolic dysnfiction (mild)   Current Outpatient Prescriptions on File Prior to Visit  Medication Sig Dispense Refill  . albuterol (PROVENTIL HFA;VENTOLIN HFA) 108 (90 BASE) MCG/ACT inhaler Inhale 2 puffs into the lungs every 6 (six) hours as needed for wheezing.  1 Inhaler  2  . beclomethasone (QVAR) 80 MCG/ACT inhaler Inhale 1 puff into the lungs 2 (two) times daily.      Marland Kitchen diltiazem (CARDIZEM CD) 300 MG 24 hr capsule TAKE 1 CAPSULE (300 MG TOTAL) BY MOUTH DAILY.  30 capsule  6  . hydrochlorothiazide (HYDRODIURIL) 25 MG tablet TAKE 1 TABLET BY MOUTH EVERY DAY  30 tablet  7  . mometasone-formoterol (DULERA) 100-5 MCG/ACT AERO Inhale 2 puffs into the lungs 2 (two) times daily.  1 Inhaler  6  . omeprazole (PRILOSEC) 20 MG capsule Take 1 capsule (20 mg total) by mouth daily.  30 capsule  11   No Known Allergies Family History  Problem Relation Age of Onset  . Diabetes    . Hypertension      . Stroke    . Hemophilia    . Breast cancer Mother   . Diabetes Mother   . Hypertension Mother   . Diabetes Father   . Hypertension Father   . Kidney failure Mother    History  Substance Use Topics  . Smoking status: Never Smoker   . Smokeless tobacco: Never Used     Comment: smoke at age 92-12  . Alcohol Use: No    Review of Systems  Constitutional: Negative for fever and chills.  Gastrointestinal: Positive for abdominal pain (ventral hernia). Negative for nausea, vomiting, diarrhea and constipation.  Genitourinary: Positive for menstrual problem. Negative for dysuria and pelvic pain.  Musculoskeletal: Negative for arthralgias.  Skin: Negative for rash.  Psychiatric/Behavioral: Negative for dysphoric mood and decreased concentration.       Objective:   Physical Exam  Vitals reviewed. Constitutional: She appears well-developed and well-nourished.  HENT:  Head: Normocephalic and atraumatic.  Eyes: No scleral icterus.  Cardiovascular: Normal rate.   Pulmonary/Chest: Effort normal.  Abdominal: Soft. There is no tenderness. A hernia is present. Hernia confirmed positive in the ventral area.    Procedure: The patient was placed in the lithotomy position and the cervix brought  into view with sterile speculum. A cervical polyp was noted and removed with ring forceps and twisting motion.  Portio of cervix cleansed x 2 with betadine swabs.  A tenaculum was placed in the anterior lip of the cervix.  The uterus was sounded for depth of 6 cm. A pipelle was introduced to into the uterus, suction created,  and an endometrial sample was obtained. All equipment was removed and accounted for.  The patient tolerated the procedure well.    Patient given post procedure instructions.      Assessment & Plan:

## 2012-04-22 NOTE — Assessment & Plan Note (Signed)
Cervical polyp removed.  Hopefully this is the explanation for her bleeding.  Await endo bx results.  If neg and bleeding continues-would recommend hysteroscopy and D and C.

## 2012-05-02 NOTE — Telephone Encounter (Signed)
Spoke with [place] at [phone] case # [id number]. Omeprazole approved 03/21/12 thru 04/11/13. Spoke with [place] at [employer] and she will call pt.

## 2012-06-04 ENCOUNTER — Other Ambulatory Visit: Payer: Self-pay | Admitting: Family Medicine

## 2012-06-08 ENCOUNTER — Other Ambulatory Visit: Payer: Self-pay | Admitting: Family Medicine

## 2012-06-11 ENCOUNTER — Encounter (HOSPITAL_COMMUNITY): Payer: Self-pay | Admitting: *Deleted

## 2012-06-11 ENCOUNTER — Emergency Department (HOSPITAL_COMMUNITY)
Admission: EM | Admit: 2012-06-11 | Discharge: 2012-06-11 | Disposition: A | Discharge status: Home / Self Care | Payer: BC Managed Care – PPO | Attending: Emergency Medicine | Admitting: Emergency Medicine

## 2012-06-11 ENCOUNTER — Emergency Department (HOSPITAL_COMMUNITY): Payer: BC Managed Care – PPO

## 2012-06-11 DIAGNOSIS — S20219A Contusion of unspecified front wall of thorax, initial encounter: Secondary | ICD-10-CM

## 2012-06-11 DIAGNOSIS — I10 Essential (primary) hypertension: Secondary | ICD-10-CM | POA: Insufficient documentation

## 2012-06-11 DIAGNOSIS — Z8709 Personal history of other diseases of the respiratory system: Secondary | ICD-10-CM | POA: Insufficient documentation

## 2012-06-11 DIAGNOSIS — Y939 Activity, unspecified: Secondary | ICD-10-CM | POA: Insufficient documentation

## 2012-06-11 DIAGNOSIS — Z8679 Personal history of other diseases of the circulatory system: Secondary | ICD-10-CM | POA: Insufficient documentation

## 2012-06-11 DIAGNOSIS — Y929 Unspecified place or not applicable: Secondary | ICD-10-CM | POA: Insufficient documentation

## 2012-06-11 DIAGNOSIS — W108XXA Fall (on) (from) other stairs and steps, initial encounter: Secondary | ICD-10-CM | POA: Insufficient documentation

## 2012-06-11 DIAGNOSIS — M259 Joint disorder, unspecified: Secondary | ICD-10-CM | POA: Insufficient documentation

## 2012-06-11 DIAGNOSIS — Z79899 Other long term (current) drug therapy: Secondary | ICD-10-CM | POA: Insufficient documentation

## 2012-06-11 DIAGNOSIS — J45909 Unspecified asthma, uncomplicated: Secondary | ICD-10-CM | POA: Insufficient documentation

## 2012-06-11 MED ORDER — HYDROMORPHONE HCL PF 1 MG/ML IJ SOLN
1.0000 mg | Freq: Once | INTRAMUSCULAR | Status: AC
  Administered 2012-06-11: 1 mg via INTRAVENOUS
  Filled 2012-06-11: qty 1

## 2012-06-11 MED ORDER — ONDANSETRON HCL 4 MG/2ML IJ SOLN
4.0000 mg | Freq: Once | INTRAMUSCULAR | Status: AC
  Administered 2012-06-11: 4 mg via INTRAVENOUS
  Filled 2012-06-11: qty 2

## 2012-06-11 MED ORDER — OXYCODONE-ACETAMINOPHEN 5-325 MG PO TABS: 2.0000 | ORAL_TABLET | ORAL | Status: DC | PRN

## 2012-06-11 NOTE — ED Provider Notes (Addendum)
History     CSN: [id number]  Arrival date & time 06/11/12  1559   First MD Initiated Contact with Patient 06/11/12 1646      Chief Complaint  Patient presents with  . Shortness of Breath  . Fall    (Consider location/radiation/quality/duration/timing/severity/associated sxs/prior treatment) HPI Comments: Patient presents with a one-day history of pain to her left lower ribs after sustaining a fall. She states she tripped at the top of the stairs and fell down some stairs yesterday. She has no head injury no loss of consciousness. She denies any neck or back pain. She has some soreness to her shoulders and her knees but she feels like these areas are okay. Her main complaint is pain to her left lower ribs. She states it hurts constantly and is worse when she moves or breathes. She feels short of breath due to pain. She denies any abdominal pain. She denies he nausea or vomiting. She denies any dizziness. She's been taking over-the-counter medicines without relief.   Past Medical History  Diagnosis Date  . ASTHMA, PERSISTENT, MODERATE 04/03/2007  . ALLERGIC RHINITIS 04/03/2007  . Hypertension   . Morbid obesity   . Paroxysmal SVT (supraventricular tachycardia)     a. 11/2011 48h Holter: RSR, rare PVC's, occas PAC's  . Dyspnea on exertion     a. 11/2007 Echo: EF 60%.    Past Surgical History  Procedure Date  . Cholecystectomy 1995  . Appendectomy 1996  . Ovarian cyst removal 1970  . Ventral hernia repair 2000  . Foot surgery 1995 / 1996    x2  . Pilonidal cyst excision 1975  . Skin cancer excision   . Tonsillectomy   . Cesarean section     x2  . US echocardiography 12/2011    WNL - EF 55-60%, mild MR, grade 1 diastolic dysnfiction (mild)    Family History  Problem Relation Age of Onset  . Diabetes    . Hypertension    . Stroke    . Hemophilia    . Breast cancer Mother   . Diabetes Mother   . Hypertension Mother   . Diabetes Father   . Hypertension Father   . Kidney  failure Mother     History  Substance Use Topics  . Smoking status: Never Smoker   . Smokeless tobacco: Never Used     Comment: smoke at age 28-12  . Alcohol Use: No    OB History    Grav Para Term Preterm Abortions TAB SAB Ect Mult Living   3 2 2  1  1   2       Review of Systems  Constitutional: Negative for fever, chills, diaphoresis and fatigue.  HENT: Negative for congestion, rhinorrhea and sneezing.   Eyes: Negative.   Respiratory: Positive for shortness of breath. Negative for cough and chest tightness.   Cardiovascular: Positive for chest pain. Negative for leg swelling.  Gastrointestinal: Negative for nausea, vomiting, abdominal pain, diarrhea and blood in stool.  Genitourinary: Negative for frequency, hematuria, flank pain and difficulty urinating.  Musculoskeletal: Positive for arthralgias. Negative for back pain.  Skin: Negative for rash.  Neurological: Negative for dizziness, speech difficulty, weakness, numbness and headaches.    Allergies  Review of patient's allergies indicates no known allergies.  Home Medications   Current Outpatient Rx  Name  Route  Sig  Dispense  Refill  . ALBUTEROL SULFATE HFA 108 (90 BASE) MCG/ACT IN AERS   Inhalation   Inhale 2 puffs  into the lungs every 6 (six) hours as needed. For wheezing         . DILTIAZEM HCL ER COATED BEADS 300 MG PO CP24   Oral   Take 300 mg by mouth daily.         Marland Kitchen HYDROCHLOROTHIAZIDE 25 MG PO TABS   Oral   Take 25 mg by mouth daily.         Marland Kitchen LANSOPRAZOLE 30 MG PO CPDR   Oral   Take 30 mg by mouth daily.         . MOMETASONE FURO-FORMOTEROL FUM 100-5 MCG/ACT IN AERO   Inhalation   Inhale 2 puffs into the lungs 2 (two) times daily.   1 Inhaler   6     BP 167/99  Pulse 122  Temp 98 F (36.7 C) (Oral)  Resp 22  SpO2 98%  Physical Exam  Constitutional: She is oriented to person, place, and time. She appears well-developed and well-nourished.       Patient is morbidly obese    HENT:  Head: Normocephalic and atraumatic.  Mouth/Throat: Oropharynx is clear and moist.  Eyes: Pupils are equal, round, and reactive to light.  Neck: Normal range of motion. Neck supple.  Cardiovascular: Normal rate, regular rhythm and normal heart sounds.   Pulmonary/Chest: Effort normal and breath sounds normal. No respiratory distress. She has no wheezes. She has no rales. She exhibits tenderness (Moderate tenderness to the left lower anterior lateral ribs. No crepitus or deformity is noted. No signs of external trauma noted).  Abdominal: Soft. Bowel sounds are normal. There is no tenderness. There is no rebound and no guarding.       No pain around the spleen area  Musculoskeletal: Normal range of motion. She exhibits no edema.       No significant pain on palpation or range of motion of extremities  Lymphadenopathy:    She has no cervical adenopathy.  Neurological: She is alert and oriented to person, place, and time.  Skin: Skin is warm and dry. No rash noted.  Psychiatric: She has a normal mood and affect.    ED Course  Procedures (including critical care time)  Labs Reviewed - No data to display Dg Chest 2 View  06/11/2012  *RADIOLOGY REPORT*  Clinical Data: Short of breath  CHEST - 2 VIEW  Comparison: 12/08/2011  Findings: Normal heart size.  No pleural effusion or edema identified.  Lung volumes are low.  No airspace consolidation. Visualized osseous structures are significant for mild thoracic spondylosis.  IMPRESSION:  1.  No acute cardiopulmonary abnormalities. 2.  Low lung volumes.   Original Report Authenticated By: Signa Kell, M.D.      1. Rib contusion       MDM  Patient has pain controlled after the allotted. I will give her prescriptions for Percocet and ibuprofen. She will be discharged with an incentive spirometer. She's maintaining normal oxygen saturations. Her chest x-ray does not show evidence of a pneumothorax. She has no pain in her abdomen suggestive  of a splenic injury.        Rolan Bucco, MD 06/11/12 1821  Rolan Bucco, MD 06/11/12 [phone]

## 2012-06-11 NOTE — ED Notes (Addendum)
To ED for eval after falling down approx 4 stairs yesterday morning. Pt states she has becoming more sob and anxious. If she sits straight up she feels better. Reclining at all makes pt very sob. States she put a wrap self and feels better with it on. Left side with extreme pain. Talking in full sentences. Skin w/d. No crepitus noted. LS CTA

## 2012-06-12 ENCOUNTER — Telehealth: Payer: Self-pay | Admitting: Family Medicine

## 2012-06-12 NOTE — Telephone Encounter (Signed)
[facility] Triage Call Report Triage Record Num: [id number] Operator: Revonda Humphrey Patient Name: Kimberly Patton Call Date & Time: 06/11/2012 2:04:12PM Patient Phone: [phone] PCP: Kerby Nora Patient Gender: Female PCP Fax : [phone] Patient DOB: [birth year] Practice Name: [facility] Reason for Call: Caller: [name]/Patient; PCP: Kerby Nora (Family Practice); CB#: [phone]; Call regarding Larey Seat down stairs, rib injury; Slipped at 2am yesterday morning 12/31 and fell down 3-4 stairs. Has multiple bruises over body, bump on head, pain just under Left breast. Has applied ice and wrapped area. Hurts to breathe, each breath. Has to sit up straight to decrease pain, worried that might have fracture, wanting XRay done. Triaged in Chest Injury Guideline - Disposition: ER Immediately due to blunt trauma and pain wtih each breath or any other difficulty breathing. Declines to go to ER, says will call office in am for appt. Protocol(s) Used: Chest Injury Recommended Outcome per Protocol: See ED Immediately Reason for Outcome: Blunt trauma AND pain with each breath or any other difficulty breathing Care Advice: ~ Another adult should drive. ~ Do not give the patient anything to eat or drink. Call EMS 911 if loss of consciousness, struggling to breathe, experiences new confusion or extreme drowsiness, change in skin color, or has chest pain or discomfort lasting 5 minutes or more. ~ ~ Place person in a position of comfort and loosen tight clothing. 06/11/2012 2:17:23PM Page 1 of 1 [id number]

## 2012-06-13 ENCOUNTER — Ambulatory Visit (INDEPENDENT_AMBULATORY_CARE_PROVIDER_SITE_OTHER): Payer: BC Managed Care – PPO | Admitting: Adult Health

## 2012-06-13 ENCOUNTER — Encounter: Payer: Self-pay | Admitting: Adult Health

## 2012-06-13 VITALS — BP 160/86 | HR 83 | Temp 98.0°F | Resp 18 | Ht 66.0 in | Wt 350.0 lb

## 2012-06-13 DIAGNOSIS — R600 Localized edema: Secondary | ICD-10-CM | POA: Insufficient documentation

## 2012-06-13 DIAGNOSIS — R609 Edema, unspecified: Secondary | ICD-10-CM

## 2012-06-13 MED ORDER — DIPHENHYDRAMINE HCL 25 MG PO CAPS: 25.0000 mg | ORAL_CAPSULE | Freq: Four times a day (QID) | ORAL | Status: DC | PRN

## 2012-06-13 NOTE — Patient Instructions (Addendum)
  The oxycodone can cause itching. You can take benadryl to counter some of this effect.  Elevate your lower extremities. This should help with your venous insufficiency.  Take an extra fluid pill for the next 2-3 days to help reduce the swelling in your lower extremities.  Call if there is no improvement by Monday.

## 2012-06-13 NOTE — Progress Notes (Signed)
Subjective:    Patient ID: Kimberly Patton, female    DOB: 09-01-1955, 57 y.o.   MRN: [id number]  HPI  Patient is a pleasant 57 y/o female patient of Dr. Kerby Nora who presents to clinic today with bilateral LE edema. She is s/p fall on Tuesday and was evaluated in the ED at [facility].  Pt has a hx of severe bilateral venous insufficiency and swelling in B. lower extremities. She is concerned because of increased swelling in the lower extremities since the fall. Patient has been sleeping in a chair and placing legs on an ottoman 2/2 not being able to lie flat in bed after the fall (bruised ribs). She denies CP, shortness of breath or pain in LE.   .   Current Outpatient Prescriptions on File Prior to Visit  Medication Sig Dispense Refill  . albuterol (PROVENTIL HFA;VENTOLIN HFA) 108 (90 BASE) MCG/ACT inhaler Inhale 2 puffs into the lungs every 6 (six) hours as needed. For wheezing      . hydrochlorothiazide (HYDRODIURIL) 25 MG tablet Take 25 mg by mouth daily.      . mometasone-formoterol (DULERA) 100-5 MCG/ACT AERO Inhale 2 puffs into the lungs 2 (two) times daily.  1 Inhaler  6  . oxyCODONE-acetaminophen (PERCOCET/ROXICET) 5-325 MG per tablet Take 2 tablets by mouth every 4 (four) hours as needed for pain.  20 tablet  0  . diltiazem (CARDIZEM CD) 300 MG 24 hr capsule Take 300 mg by mouth daily.      . diphenhydrAMINE (BENADRYL) 25 mg capsule Take 1 capsule (25 mg total) by mouth every 6 (six) hours as needed for itching.  30 capsule  0  . lansoprazole (PREVACID) 30 MG capsule Take 30 mg by mouth daily.         Review of Systems  Constitutional: Positive for activity change. Negative for fever, chills and appetite change.       Tearful throughout entire visit  HENT: Negative.   Respiratory: Negative for cough, chest tightness and wheezing.        Painful to take deep breaths secondary to bruised ribs s/p fall  Cardiovascular: Positive for leg swelling. Negative for chest pain.   Grossly edematous bilateral LE mainly from thigh to mid lower leg. Hx of venous insufficiency  Gastrointestinal: Positive for constipation. Negative for abdominal pain.  Skin: Negative for rash.       Itching likely secondary to oxycodone. No rash.  Neurological: Negative.   Psychiatric/Behavioral: Negative for confusion and agitation. The patient is not nervous/anxious.     BP 160/86  Pulse 83  Temp 98 F (36.7 C) (Oral)  Resp 18  Ht 5\' 6"  (1.676 m)  Wt 350 lb (158.759 kg)  BMI 56.49 kg/m2     Objective:   Physical Exam  Constitutional: She is oriented to person, place, and time. No distress.  Cardiovascular: Normal rate, regular rhythm and normal heart sounds.   No murmur heard. Pulmonary/Chest: Effort normal and breath sounds normal. No respiratory distress.       Inability to take deep breath 2/2 bruised ribs from fall  Abdominal: Soft. Bowel sounds are normal.  Musculoskeletal: She exhibits edema.       Grossly edematous bilateral LE  Neurological: She is alert and oriented to person, place, and time.  Skin: Skin is warm and dry.  Psychiatric: Her behavior is normal. Judgment and thought content normal.       Tearful during visit       Assessment &  Plan:

## 2012-06-13 NOTE — Assessment & Plan Note (Signed)
Patient with known venous insufficiency. Bilateral LE with increased edema secondary to inability to elevate LE. Discussed options to try so that she can lay in her bed and elevate legs. Take extra HCTZ for the next 2-3 days to see if this helps with edema. RTC if symptoms do not improve by Monday.

## 2012-07-26 ENCOUNTER — Other Ambulatory Visit: Payer: Self-pay

## 2012-09-20 ENCOUNTER — Encounter: Payer: Self-pay | Admitting: Family Medicine

## 2012-09-20 ENCOUNTER — Ambulatory Visit (INDEPENDENT_AMBULATORY_CARE_PROVIDER_SITE_OTHER): Payer: BC Managed Care – PPO | Admitting: Family Medicine

## 2012-09-20 VITALS — BP 110/82 | HR 95 | Temp 97.8°F | Ht 68.0 in | Wt 331.8 lb

## 2012-09-20 DIAGNOSIS — L723 Sebaceous cyst: Secondary | ICD-10-CM

## 2012-09-20 DIAGNOSIS — L089 Local infection of the skin and subcutaneous tissue, unspecified: Secondary | ICD-10-CM | POA: Insufficient documentation

## 2012-09-20 MED ORDER — DOXYCYCLINE HYCLATE 100 MG PO TABS: 100.0000 mg | ORAL_TABLET | Freq: Two times a day (BID) | ORAL | Status: DC

## 2012-09-20 NOTE — Progress Notes (Signed)
  Subjective:    Patient ID: Kimberly Patton, female    DOB: 05-19-1956, 57 y.o.   MRN: [id number]  HPI Pt here presents with sebaceous cyst on chest. She has had it for years but recently it got bigger and is now painful.  No other complaints.     Review of Systems    as above Objective:   Physical Exam  BP 110/82  Pulse 95  Temp(Src) 97.8 F (36.6 C) (Oral)  Ht 5\' 8"  (1.727 m)  Wt 331 lb 12 oz (150.481 kg)  BMI 50.45 kg/m2  SpO2 95% General appearance: alert, cooperative, appears stated age and no distress Skin: +cyst , no drainage,  + errythema and tender to touch,  + 2 in diameter      Assessment & Plan:

## 2012-09-20 NOTE — Patient Instructions (Signed)

## 2012-09-20 NOTE — Assessment & Plan Note (Signed)
abx per orders Warm compresses Refer to surgeon if no improvement

## 2012-09-22 ENCOUNTER — Encounter: Payer: Self-pay | Admitting: Family Medicine

## 2012-09-22 ENCOUNTER — Ambulatory Visit (INDEPENDENT_AMBULATORY_CARE_PROVIDER_SITE_OTHER): Payer: BC Managed Care – PPO | Admitting: Family Medicine

## 2012-09-22 ENCOUNTER — Telehealth: Payer: Self-pay | Admitting: Family Medicine

## 2012-09-22 VITALS — BP 140/90 | HR 74 | Temp 97.2°F | Wt 330.0 lb

## 2012-09-22 DIAGNOSIS — L089 Local infection of the skin and subcutaneous tissue, unspecified: Secondary | ICD-10-CM

## 2012-09-22 DIAGNOSIS — L723 Sebaceous cyst: Secondary | ICD-10-CM

## 2012-09-22 MED ORDER — SULFAMETHOXAZOLE-TMP DS 800-160 MG PO TABS: 2.0000 | ORAL_TABLET | Freq: Two times a day (BID) | ORAL | Status: DC

## 2012-09-22 MED ORDER — CEPHALEXIN 500 MG PO CAPS: 1000.0000 mg | ORAL_CAPSULE | Freq: Two times a day (BID) | ORAL | Status: DC

## 2012-09-22 MED ORDER — HYDROCODONE-ACETAMINOPHEN 5-325 MG PO TABS: 1.0000 | ORAL_TABLET | Freq: Four times a day (QID) | ORAL | Status: DC | PRN

## 2012-09-22 NOTE — Telephone Encounter (Signed)
[facility] Triage Call Report Triage Record Num: [id number] Operator: Revonda Humphrey Patient Name: Kimberly Patton Call Date & Time: 09/20/2012 8:34:40AM Patient Phone: [phone] PCP: Kerby Nora Patient Gender: Female PCP Fax : [phone] Patient DOB: 1956-02-19 Practice Name: [facility] Reason for Call: Caller: [name]/Patient; PCP: Other; CB#: [phone]; Call regarding Cyst On Chest, Swollen, Feels Feverish; Has had lump size of pea in center chest at lower sternum for many years. Yesterday 4/11 came home in afternoon to find area larger to quarter size, hot to touch, red, painful to touch. Afebrile. Triaged in Skin Lesions Guideline - Disposition: See Provider Within 24 Hours due to new signs and symptoms of local infection. Appt today 10:30 am [facility] office. Protocol(s) Used: Skin Lesions Recommended Outcome per Protocol: See Provider within 24 hours Reason for Outcome: New signs and symptoms of local infection

## 2012-09-22 NOTE — Patient Instructions (Addendum)
REFERRAL: GO THE THE FRONT ROOM AT THE ENTRANCE OF OUR CLINIC, NEAR CHECK IN. ASK FOR MARION. SHE WILL HELP YOU SET UP YOUR REFERRAL. DATE: TIME:  

## 2012-09-23 NOTE — Progress Notes (Signed)
Nature conservation officer at [facility] [address] [place] [address] Phone: [phone] Fax: [phone]  Date:  09/22/2012   Name:  Kimberly Patton   DOB:  [birth year]   MRN:  [id number] Gender: female Age: 57 y.o.  Primary Physician:  Kerby Nora, MD  Evaluating MD: Hannah Beat, MD   Chief Complaint: Cyst on chest   History of Present Illness:  Kimberly Patton is a 57 y.o. pleasant patient who presents with the following:  Pleasant patient of Dr. Ermalene Searing who was seen in the Saturday clinic and felt to have a potentially infected cyst. She was placed on doxycycline and not had any improvement. She has been using warm compresses about 2 or 3 times a day. ? If maybe something drained from it. It is causing the patient some significant acute pain. Located in the anterior chest wall between breasts.   Patient Active Problem List  Diagnosis  . FIBROIDS, UTERUS  . SUPRAVENTRICULAR TACHYCARDIA  . PHARYNGITIS, PERSISTENT  . ALLERGIC RHINITIS  . Asthma, intrinsic  . HERNIA  . CONTACT DERMATITIS&OTHER ECZEMA DUE TO PLANTS  . DERMATITIS, CONTACT, NOS  . PETECHIAE  . EDEMA- LOCALIZED  . Palpitations  . Varicose veins  . Varicose veins of lower extremities with other complications  . HTN (hypertension)  . Dyspnea  . Splenomegaly  . Midsternal chest pain  . Post-menopausal bleeding  . Visit for gynecologic examination  . Edema of both legs  . Infected sebaceous cyst of skin    Past Medical History  Diagnosis Date  . ASTHMA, PERSISTENT, MODERATE 04/03/2007  . ALLERGIC RHINITIS 04/03/2007  . Hypertension   . Morbid obesity   . Paroxysmal SVT (supraventricular tachycardia)     a. 11/2011 48h Holter: RSR, rare PVC's, occas PAC's  . Dyspnea on exertion     a. 11/2007 Echo: EF 60%.    Past Surgical History  Procedure Laterality Date  . Cholecystectomy  1995  . Appendectomy  1996  . Ovarian cyst removal  1970  . Ventral hernia repair  2000  . Foot surgery   1995 / 1996    x2  . Pilonidal cyst excision  1975  . Skin cancer excision    . Tonsillectomy    . Cesarean section      x2  . US echocardiography  12/2011    WNL - EF 55-60%, mild MR, grade 1 diastolic dysnfiction (mild)    History   Social History  . Marital Status: Married    Spouse Name: N/A    Number of Children: 2  . Years of Education: N/A   Occupational History  . teacher [employer]   Social History Main Topics  . Smoking status: Never Smoker   . Smokeless tobacco: Never Used     Comment: smoke at age [age]  . Alcohol Use: No  . Drug Use: No  . Sexually Active: Yes -- Female partner(s)    Birth Control/ Protection: Post-menopausal   Other Topics Concern  . Not on file   Social History Narrative  . No narrative on file    Family History  Problem Relation Age of Onset  . Diabetes    . Hypertension    . Stroke    . Hemophilia    . Breast cancer Mother   . Diabetes Mother   . Hypertension Mother   . Diabetes Father   . Hypertension Father   . Kidney failure Mother     No  Known Allergies  Medication list has been reviewed and updated.  Outpatient Prescriptions Prior to Visit  Medication Sig Dispense Refill  . albuterol (PROVENTIL HFA;VENTOLIN HFA) 108 (90 BASE) MCG/ACT inhaler Inhale 2 puffs into the lungs every 6 (six) hours as needed. For wheezing      . Cholecalciferol (VITAMIN D-3 PO) Take by mouth.      . diltiazem (CARDIZEM CD) 300 MG 24 hr capsule Take 300 mg by mouth daily.      . diphenhydrAMINE (BENADRYL) 25 mg capsule Take 1 capsule (25 mg total) by mouth every 6 (six) hours as needed for itching.  30 capsule  0  . hydrochlorothiazide (HYDRODIURIL) 25 MG tablet Take 25 mg by mouth daily.      Marland Kitchen ibuprofen (ADVIL,MOTRIN) 200 MG tablet Take 400 mg by mouth every 6 (six) hours as needed.      . lansoprazole (PREVACID) 30 MG capsule Take 30 mg by mouth daily.      . mometasone-formoterol (DULERA) 100-5 MCG/ACT AERO Inhale 2 puffs  into the lungs 2 (two) times daily.  1 Inhaler  6  . doxycycline (VIBRA-TABS) 100 MG tablet Take 1 tablet (100 mg total) by mouth 2 (two) times daily.  20 tablet  0  . oxyCODONE-acetaminophen (PERCOCET/ROXICET) 5-325 MG per tablet Take 2 tablets by mouth every 4 (four) hours as needed for pain.  20 tablet  0   No facility-administered medications prior to visit.    Review of Systems:   GEN: No acute illnesses, no fevers, chills. GI: No n/v/d, eating normally Pulm: No SOB Interactive and getting along well at home.  Otherwise, ROS is as per the HPI.   Physical Examination: BP 140/90  Pulse 74  Temp(Src) 97.2 F (36.2 C)  Wt 330 lb (149.687 kg)  BMI 50.19 kg/m2  Ideal Body Weight:     GEN: WDWN, NAD, Non-toxic, Alert & Oriented x 3 HEENT: Atraumatic, Normocephalic.  Ears and Nose: No external deformity. EXTR: No clubbing/cyanosis/edema NEURO: Normal gait.  PSYCH: Normally interactive. Conversant. Not depressed or anxious appearing.  Calm demeanor.  SKIN: In the fold between breasts, just caudal to this area there is a painful, reddened area the size of a 50 cent piece. Notably tender to palpation and indurated, but there is no area of fluctuance.  Assessment and Plan:  Infected sebaceous cyst - Plan: Ambulatory referral to General Surgery  I don't think that there is anything that I can do surgically today in the office. No fluctuance and with location next to breast in 50 BMI patient, I could significantly harm her blindly incising nonfluctuant tissue in this area.   Seems most c/w infected sebaceous cyst. Change to Keflex and Septra DS MRSA dosing, warm compresses 6-10 times a day. I am going to have her be on ABX for a few days and f/u with General Surgery to see if they can remove the cyst entirely or how they would recommend treating it.   Orders Today:  Orders Placed This Encounter  Procedures  . Ambulatory referral to General Surgery    Referral Priority:  Routine     Referral Type:  Surgical    Referral Reason:  Specialty Services Required    Requested Specialty:  General Surgery    Number of Visits Requested:  1    Updated Medication List: (Includes new medications, updates to list, dose adjustments) Meds ordered this encounter  Medications  . sulfamethoxazole-trimethoprim (BACTRIM DS) 800-160 MG per tablet    Sig: Take  2 tablets by mouth 2 (two) times daily.    Dispense:  40 tablet    Refill:  0  . cephALEXin (KEFLEX) 500 MG capsule    Sig: Take 2 capsules (1,000 mg total) by mouth 2 (two) times daily.    Dispense:  40 capsule    Refill:  0  . HYDROcodone-acetaminophen (NORCO/VICODIN) 5-325 MG per tablet    Sig: Take 1 tablet by mouth every 6 (six) hours as needed for pain.    Dispense:  40 tablet    Refill:  0    Medications Discontinued: Medications Discontinued During This Encounter  Medication Reason  . oxyCODONE-acetaminophen (PERCOCET/ROXICET) 5-325 MG per tablet Error  . doxycycline (VIBRA-TABS) 100 MG tablet       Signed, Seri Kimmer T. Belanna Manring, MD 09/23/2012 9:45 AM

## 2012-09-29 ENCOUNTER — Ambulatory Visit (INDEPENDENT_AMBULATORY_CARE_PROVIDER_SITE_OTHER): Payer: BC Managed Care – PPO | Admitting: General Surgery

## 2012-09-29 ENCOUNTER — Encounter (INDEPENDENT_AMBULATORY_CARE_PROVIDER_SITE_OTHER): Payer: Self-pay | Admitting: General Surgery

## 2012-09-29 VITALS — BP 122/78 | HR 68 | Temp 98.6°F | Resp 20 | Ht 68.0 in | Wt 339.0 lb

## 2012-09-29 DIAGNOSIS — L03319 Cellulitis of trunk, unspecified: Secondary | ICD-10-CM

## 2012-09-29 DIAGNOSIS — L02219 Cutaneous abscess of trunk, unspecified: Secondary | ICD-10-CM

## 2012-09-29 DIAGNOSIS — L089 Local infection of the skin and subcutaneous tissue, unspecified: Secondary | ICD-10-CM

## 2012-09-29 DIAGNOSIS — L723 Sebaceous cyst: Secondary | ICD-10-CM

## 2012-09-29 MED ORDER — HYDROCODONE-ACETAMINOPHEN 5-325 MG PO TABS: 1.0000 | ORAL_TABLET | Freq: Four times a day (QID) | ORAL | Status: DC | PRN

## 2012-09-29 NOTE — Progress Notes (Signed)
Patient ID: Kimberly Patton, female   DOB: February 23, 1956, 57 y.o.   MRN: [id number]  Chief Complaint  Patient presents with  . Cyst    HPI Kimberly Patton is a 57 y.o. female.  HPI 57 yo WF referred by Dr Karleen Hampshire Copland for evaluation of an infected chest wall sebaceous cyst. The patient states that she has a history of sebaceous cysts. She states that the cyst on her chest wall between her breasts has been present for years however it has generally been the size of a small pea. Around April 11 the area became quite swollen, red, and tender to palpation. She states her upper chest was entirely read. She states that she went to the Saturday clinic and was evaluated and put initially on doxycycline. She presented 2 days later because of ongoing pain and discomfort. The antibiotics were switched to Keflex and Septra. She started applying warm compresses to the area. She states that the redness has greatly improved however she is still very tender around the cyst. She reports some intermittent spotty drainage. She denies any fevers or chills. She denies any weight loss. She denies any lymphadenopathy. Past Medical History  Diagnosis Date  . ASTHMA, PERSISTENT, MODERATE 04/03/2007  . ALLERGIC RHINITIS 04/03/2007  . Hypertension   . Morbid obesity   . Paroxysmal SVT (supraventricular tachycardia)     a. 11/2011 48h Holter: RSR, rare PVC's, occas PAC's  . Dyspnea on exertion     a. 11/2007 Echo: EF 60%.    Past Surgical History  Procedure Laterality Date  . Cholecystectomy  1995  . Appendectomy  1996  . Ovarian cyst removal  1970  . Ventral hernia repair  2000  . Foot surgery  1995 / 1996    x2  . Pilonidal cyst excision  1975  . Skin cancer excision    . Tonsillectomy    . Cesarean section      x2  . US echocardiography  12/2011    WNL - EF 55-60%, mild MR, grade 1 diastolic dysnfiction (mild)    Family History  Problem Relation Age of Onset  . Diabetes    . Hypertension    . Stroke     . Hemophilia    . Breast cancer Mother   . Diabetes Mother   . Hypertension Mother   . Diabetes Father   . Hypertension Father   . Kidney failure Mother     Social History History  Substance Use Topics  . Smoking status: Never Smoker   . Smokeless tobacco: Never Used     Comment: smoke at age 62-12  . Alcohol Use: No    No Known Allergies  Current Outpatient Prescriptions  Medication Sig Dispense Refill  . albuterol (PROVENTIL HFA;VENTOLIN HFA) 108 (90 BASE) MCG/ACT inhaler Inhale 2 puffs into the lungs every 6 (six) hours as needed. For wheezing      . CALCIUM PO Take by mouth daily.      . cephALEXin (KEFLEX) 500 MG capsule Take 2 capsules (1,000 mg total) by mouth 2 (two) times daily.  40 capsule  0  . Cholecalciferol (VITAMIN D-3 PO) Take by mouth.      . diltiazem (CARDIZEM CD) 300 MG 24 hr capsule Take 300 mg by mouth daily.      . hydrochlorothiazide (HYDRODIURIL) 25 MG tablet Take 25 mg by mouth daily.      Marland Kitchen ibuprofen (ADVIL,MOTRIN) 200 MG tablet Take 400 mg by mouth every 6 (six) hours  as needed.      . lansoprazole (PREVACID) 30 MG capsule Take 30 mg by mouth daily.      . mometasone-formoterol (DULERA) 100-5 MCG/ACT AERO Inhale 2 puffs into the lungs 2 (two) times daily.  1 Inhaler  6  . sulfamethoxazole-trimethoprim (BACTRIM DS) 800-160 MG per tablet Take 2 tablets by mouth 2 (two) times daily.  40 tablet  0  . HYDROcodone-acetaminophen (NORCO/VICODIN) 5-325 MG per tablet Take 1 tablet by mouth every 6 (six) hours as needed for pain.  30 tablet  0   No current facility-administered medications for this visit.    Review of Systems Review of Systems  Constitutional: Negative for fever, chills and unexpected weight change.  HENT: Negative for hearing loss, congestion, sore throat, trouble swallowing and voice change.   Eyes: Negative for visual disturbance.  Respiratory: Negative for cough, shortness of breath and wheezing.        Does have asthma    Cardiovascular: Positive for chest pain (pain around cyst site). Negative for palpitations and leg swelling.       Denies SOB, PND, orthopnea  Gastrointestinal: Negative for nausea, vomiting, abdominal pain, diarrhea, constipation, blood in stool, abdominal distention and anal bleeding.       Has recurrent hernia  Genitourinary: Negative for hematuria, vaginal bleeding and difficulty urinating.  Musculoskeletal: Negative for arthralgias.  Skin: Negative for rash and wound.  Neurological: Negative for seizures, syncope and headaches.  Hematological: Negative for adenopathy. Does not bruise/bleed easily.  Psychiatric/Behavioral: Negative for confusion.    Blood pressure 122/78, pulse 68, temperature 98.6 F (37 C), temperature source Temporal, resp. rate 20, height 5\' 8"  (1.727 m), weight 339 lb (153.769 kg).  Physical Exam Physical Exam  Vitals reviewed. Constitutional: She is oriented to person, place, and time. She appears well-developed and well-nourished. No distress.  Pleasant, morbidly obese  HENT:  Head: Normocephalic and atraumatic.  Right Ear: External ear normal.  Left Ear: External ear normal.  Eyes: Conjunctivae are normal.  Neck: Neck supple. No tracheal deviation present.  Cardiovascular: Normal rate, regular rhythm and normal heart sounds.   Pulmonary/Chest: Effort normal and breath sounds normal. No respiratory distress. She has no wheezes.    Well circumscribed soft tissue swelling along sternum with TTP, redness, induration c/w infected sebaceous cyst - about 3 x 3cm  Abdominal: Soft. She exhibits no distension.  Ventral hernia - soft  Musculoskeletal: Normal range of motion. She exhibits no tenderness.  Lymphadenopathy:    She has no cervical adenopathy.       Right: No supraclavicular adenopathy present.       Left: No supraclavicular adenopathy present.  Neurological: She is alert and oriented to person, place, and time.  Skin: Skin is warm and dry. She is  not diaphoretic.  Psychiatric: She has a normal mood and affect. Her behavior is normal. Judgment and thought content normal.    Data Reviewed Office notes from 4/12 and 4/14  Assessment    Infected chest wall epidermal inclusion cyst     Plan    We discussed sebaceous cyst-epidermoid inclusion cyst. We explained that the best way to prevent recurrent infection is to excise the cyst in its entirety. However given the current infection and inflammation I recommended just incising and draining the cyst today to allow the infection to resolve followed by definitive excision in 6-8 weeks from now. I explained that if we excise the area currently we would end up excising more tissue than needed and  that the wound would certainly get infected.  After obtaining informed consent, the chest wall was prepped with ChloraPrep. 3 cc of 2% Xylocaine with epinephrine was infiltrated over the maximal area of induration and fluctuance. I then made a vertical 1-1/2 cm incision with a #11 blade. There was frank drainage of liquid cheesy material. The wound was packed with iodoform gauze and covered with a dry gauze. The patient tolerated the procedure well.  The patient was given wound care instructions. I encouraged her to leave the pack today. I also encouraged her to wick the skin edges with the corner of gauze and covered with gauze.  She is to finish her current antibiotic prescription. I gave her prescription for Norco. She states that she no longer has a prescription for pain medicine that her PCP gave her. I will see her in 4 weeks at which time we will discuss definitive excision  Mary Sella. Andrey Campanile, MD, FACS General, Bariatric, & Minimally Invasive Surgery [facility], [place]         [facility] M 09/29/2012, 9:08 AM

## 2012-09-29 NOTE — Patient Instructions (Addendum)
Finish up antibiotics  Abscess Care After An abscess (also called a boil or furuncle) is an infected area that contains a collection of pus. Signs and symptoms of an abscess include pain, tenderness, redness, or hardness, or you may feel a moveable soft area under your skin. An abscess can occur anywhere in the body. The infection may spread to surrounding tissues causing cellulitis. A cut (incision) by the surgeon was made over your abscess and the pus was drained out. Gauze may have been packed into the space to provide a drain that will allow the cavity to heal from the inside outwards. The boil may be painful for 5 to 7 days. Most people with a boil do not have high fevers. Your abscess, if seen early, may not have localized, and may not have been lanced. If not, another appointment may be required for this if it does not get better on its own or with medications.  HOME CARE INSTRUCTIONS   Only take over-the-counter or prescription medicines for pain, discomfort, or fever as directed by your caregiver.   When you bathe, soak and then remove gauze or iodoform packs. You may then wash the wound gently with mild soapy water. Wick the wound (place corner of gauze between skin edges) with corner of gauze and cover with gauze or do as your caregiver directs.   SEEK IMMEDIATE MEDICAL CARE IF:   You develop increased pain, swelling, redness, drainage, or bleeding in the wound site.   You develop signs of generalized infection including muscle aches, chills, fever, or a general ill feeling.   An oral temperature above 102 F (38.9 C) develops, not controlled by medication.  See your caregiver for a recheck if you develop any of the symptoms described above. If medications (antibiotics) were prescribed, take them as directed.

## 2012-10-29 ENCOUNTER — Encounter (INDEPENDENT_AMBULATORY_CARE_PROVIDER_SITE_OTHER): Payer: Self-pay | Admitting: General Surgery

## 2012-10-29 ENCOUNTER — Ambulatory Visit (INDEPENDENT_AMBULATORY_CARE_PROVIDER_SITE_OTHER): Payer: BC Managed Care – PPO | Admitting: General Surgery

## 2012-10-29 VITALS — BP 130/82 | HR 66 | Temp 97.0°F | Resp 18 | Ht 67.0 in | Wt 337.0 lb

## 2012-10-29 DIAGNOSIS — Z09 Encounter for follow-up examination after completed treatment for conditions other than malignant neoplasm: Secondary | ICD-10-CM

## 2012-10-29 NOTE — Progress Notes (Signed)
Subjective:     Patient ID: Kimberly Patton, female   DOB: 1955/10/26, 57 y.o.   MRN: [id number]  HPI 57 year old Caucasian female comes in for followup after going incision and drainage of infected chest wall epidermoid inclusion cyst in the office a few weeks ago. He said she did quite well after the procedure. She denies any continued pain or discomfort area. She did have some bloody drainage about a week and a half ago.  Review of Systems     Objective:   Physical Exam BP 130/82  Pulse 66  Temp(Src) 97 F (36.1 C) (Oral)  Resp 18  Ht 5\' 7"  (1.702 m)  Wt 337 lb (152.862 kg)  BMI 52.77 kg/m2 Alert, no apparent distress Chest-well-healed vertical incision along the sternum. She has a residual small sinus opening. No cellulitis, fluctuants or induration    Assessment:     Chest wall epidermoid inclusion cyst status post incision and drainage     Plan:     We rediscussed sebaceous cyst management. There appears to be no active infection. We discussed observation with possible recurrence of infection versus definitive excision. We discussed the risk and benefits of surgery including but not limited to bleeding, infection, recurrence, and scarring. The patient became emotional. She states that right now she also focused on her ventral hernia. We discussed the importance of weight loss to help with decreasing her chance of ventral hernia recurrence if she chooses to proceed with surgery for that down in [place].  Right now she would like to wait until the fall to get the cystic excised since right now she is swimming on a daily basis. She was instructed to call the office when she would like to proceed with surgery for cyst excision  Mary Sella. Andrey Campanile, MD, FACS General, Bariatric, & Minimally Invasive Surgery [facility], [place]

## 2012-10-29 NOTE — Patient Instructions (Signed)
Call when you are ready to have cyst excised Work on weight loss like we discussed

## 2012-12-05 ENCOUNTER — Other Ambulatory Visit: Payer: Self-pay | Admitting: Family Medicine

## 2012-12-08 ENCOUNTER — Encounter: Payer: Self-pay | Admitting: Family Medicine

## 2012-12-08 ENCOUNTER — Ambulatory Visit (INDEPENDENT_AMBULATORY_CARE_PROVIDER_SITE_OTHER): Payer: BC Managed Care – PPO | Admitting: Family Medicine

## 2012-12-08 VITALS — BP 123/87 | HR 80 | Ht 66.0 in | Wt 337.0 lb

## 2012-12-08 DIAGNOSIS — N950 Postmenopausal bleeding: Secondary | ICD-10-CM

## 2012-12-08 DIAGNOSIS — E6609 Other obesity due to excess calories: Secondary | ICD-10-CM | POA: Insufficient documentation

## 2012-12-08 NOTE — Assessment & Plan Note (Signed)
To work on calorie counting.  Continue daily water exercise.

## 2012-12-08 NOTE — Assessment & Plan Note (Signed)
Probably related to a polyp but, If has any further bleeding to return immediately for D & C

## 2012-12-08 NOTE — Patient Instructions (Signed)
Postmenopausal Bleeding Menopause is commonly referred to as the "change in life." It is a time when the fertile years, the time of ovulating and having menstrual periods, has come to an end. It is also determined by not having menstrual periods for 12 months.  Postmenopausal bleeding is any bleeding a woman has after she has entered into menopause. Any type of postmenopausal bleeding, even if it appears to be a typical menstrual period, is concerning. This should be evaluated by your caregiver.  CAUSES   Hormone therapy.  Cancer of the cervix or cancer of the lining of the uterus (endometrial cancer).  Thinning of the uterine lining (uterine atrophy).  Thyroid diseases.  Certain medicines.  Infection of the uterus or cervix.  Inflammation or irritation of the uterine lining (endometritis).  Estrogen-secreting tumors.  Growths (polyps) on the cervix, uterine lining, or uterus.  Uterine tumors (fibroids).  Being very overweight (obese). DIAGNOSIS  Your caregiver will take a medical history and ask questions. A physical exam will also be performed. Further tests may include:   A transvaginal ultrasound. An ultrasound wand or probe is inserted into your vagina to view the pelvic organs.  A biopsy of the lining of the uterus (endometrium). A sample of the endometrium is removed and examined.  A hysteroscopy. Your caregiver may use an instrument with a light and a camera attached to it (hysteroscope). The hysteroscope is used to look inside the uterus for problems.  A dilation and curettage (D&C). Tissue is removed from the uterine lining to be examined for problems. TREATMENT  Treatment depends on the cause of the bleeding. Some treatments include:   Surgery.  Medicines.  Hormones.  A hysteroscopy or D&C to remove polyps or fibroids.  Changing or stopping a current medicine you are taking. Talk to your caregiver about your specific treatment. HOME CARE INSTRUCTIONS    Maintain a healthy weight.  Keep regular pelvic exams and Pap tests. SEEK MEDICAL CARE IF:   You have bleeding, even if it is light in comparison to your previous periods.  Your bleeding lasts more than 1 week.  You have abdominal pain.  You develop bleeding with sexual intercourse. SEEK IMMEDIATE MEDICAL CARE IF:   You have a fever, chills, headache, dizziness, muscle aches, and bleeding.  You have severe pain with bleeding.  You are passing blood clots.  You have bleeding and need more than 1 pad an hour.  You feel faint. MAKE SURE YOU:  Understand these instructions.  Will watch your condition.  Will get help right away if you are not doing well or get worse. Document Released: 09/05/2005 Document Revised: 08/20/2011 Document Reviewed: 02/01/2011 [facility] Patient Information 2014 [place], [place]. Calorie Counting Diet A calorie counting diet requires you to eat the number of calories that are right for you in a day. Calories are the measurement of how much energy you get from the food you eat. Eating the right amount of calories is important for staying at a healthy weight. If you eat too many calories, your body will store them as fat and you may gain weight. If you eat too few calories, you may lose weight. Counting the number of calories you eat during a day will help you know if you are eating the right amount. A Registered Dietitian can determine how many calories you need in a day. The amount of calories needed varies from person to person. If your goal is to lose weight, you will need to eat fewer calories.  Losing weight can benefit you if you are overweight or have health problems such as heart disease, high blood pressure, or diabetes. If your goal is to gain weight, you will need to eat more calories. Gaining weight may be necessary if you have a certain health problem that causes your body to need more energy. TIPS Whether you are increasing or decreasing the  number of calories you eat during a day, it may be hard to get used to changes in what you eat and drink. The following are tips to help you keep track of the number of calories you eat.  Measure foods at home with measuring cups. This helps you know the amount of food and number of calories you are eating.  Restaurants often serve food in amounts that are larger than 1 serving. While eating out, estimate how many servings of a food you are given. For example, a serving of cooked rice is  cup or about the size of half of a fist. Knowing serving sizes will help you be aware of how much food you are eating at restaurants.  Ask for smaller portion sizes or child-size portions at restaurants.  Plan to eat half of a meal at a restaurant. Take the rest home or share the other half with a friend.  Read the Nutrition Facts panel on food labels for calorie content and serving size. You can find out how many servings are in a package, the size of a serving, and the number of calories each serving has.  For example, a package might contain 3 cookies. The Nutrition Facts panel on that package says that 1 serving is 1 cookie. Below that, it will say there are 3 servings in the container. The calories section of the Nutrition Facts label says there are 90 calories. This means there are 90 calories in 1 cookie (1 serving). If you eat 1 cookie you have eaten 90 calories. If you eat all 3 cookies, you have eaten 270 calories (3 servings x 90 calories = 270 calories). The list below tells you how big or small some common portion sizes are.  1 oz.........4 stacked dice.  3 oz........Marland KitchenDeck of cards.  1 tsp.......Marland KitchenTip of little finger.  1 tbs......Marland KitchenMarland KitchenThumb.  2 tbs.......Marland KitchenGolf ball.   cup......Marland KitchenHalf of a fist.  1 cup.......Marland KitchenA fist. KEEP A FOOD LOG Write down every food item you eat, the amount you eat, and the number of calories in each food you eat during the day. At the end of the day, you can add up the  total number of calories you have eaten. It may help to keep a list like the one below. Find out the calorie information by reading the Nutrition Facts panel on food labels. Breakfast  Bran cereal (1 cup, 110 calories).  Fat-free milk ( cup, 45 calories). Snack  Apple (1 medium, 80 calories). Lunch  Spinach (1 cup, 20 calories).  Tomato ( medium, 20 calories).  Chicken breast strips (3 oz, 165 calories).  Shredded cheddar cheese ( cup, 110 calories).  Light [place] dressing (2 tbs, 60 calories).  Whole-wheat bread (1 slice, 80 calories).  Tub margarine (1 tsp, 35 calories).  Vegetable soup (1 cup, 160 calories). Dinner  Pork chop (3 oz, 190 calories).  Brown rice (1 cup, 215 calories).  Steamed broccoli ( cup, 20 calories).  Strawberries (1  cup, 65 calories).  Whipped cream (1 tbs, 50 calories). Daily Calorie Total: 1425 Document Released: 05/28/2005 Document Revised: 08/20/2011 Document Reviewed: 11/22/2006 [employer] Patient  Information 2014 [employer].  

## 2012-12-08 NOTE — Progress Notes (Signed)
  Subjective:    Patient ID: Kimberly Patton, female    DOB: 07-09-1955, 57 y.o.   MRN: [id number]  HPI  Pt. Returns.  Had PMB and polyp removed.  EMB did not get endometrial tissue.  Continues to gain weight.  Her hernia has gotten bigger. No further bleeding noted.  Review of Systems  Constitutional: Negative for fever and chills.  Respiratory: Negative for shortness of breath and wheezing.   Genitourinary: Negative for hematuria.  Musculoskeletal: Negative for back pain.  Skin: Positive for rash.       Objective:   Physical Exam  Vitals reviewed. Constitutional: She appears well-developed and well-nourished. No distress.  Eyes: No scleral icterus.  Neck: Neck supple.  Cardiovascular: Normal rate.   Pulmonary/Chest: Effort normal.  Abdominal: Soft.          Assessment & Plan:

## 2013-01-03 ENCOUNTER — Other Ambulatory Visit: Payer: Self-pay | Admitting: Family Medicine

## 2013-02-20 ENCOUNTER — Encounter (INDEPENDENT_AMBULATORY_CARE_PROVIDER_SITE_OTHER): Payer: Self-pay | Admitting: Surgery

## 2013-02-20 ENCOUNTER — Ambulatory Visit (INDEPENDENT_AMBULATORY_CARE_PROVIDER_SITE_OTHER): Payer: BC Managed Care – PPO | Admitting: Surgery

## 2013-02-20 ENCOUNTER — Other Ambulatory Visit (INDEPENDENT_AMBULATORY_CARE_PROVIDER_SITE_OTHER): Payer: Self-pay

## 2013-02-20 DIAGNOSIS — I10 Essential (primary) hypertension: Secondary | ICD-10-CM

## 2013-02-20 DIAGNOSIS — Z6841 Body Mass Index (BMI) 40.0 and over, adult: Secondary | ICD-10-CM

## 2013-02-20 NOTE — Patient Instructions (Signed)
Sleeve Gastrectomy A sleeve gastrectomy is an operation that removes a large portion of your stomach. This operation is performed to help you lose weight. You lose weight with this operation because it restricts the amount of food you can eat. Your stomach will be a narrow tube after the operation (the size of a banana). Your stomach will hold much less food than your normal stomach. Also, the portion of your stomach that is removed produces a hormone that causes hunger. You are a candidate for this operation if you have morbid obesity, defined as a body mass index (BMI) greater than 40. You may also be a candidate if you have severe obesity related diseases such as: diabetes mellitus 2, obstructive sleep apnea, or cardiopulmonary disease (heart and lung) with a BMI greater than 35. You will need to talk with your surgeon and insurance company to find out if this surgery is right for you.  Sleeve gastrectomy is a good alternative to other treatments of obesity (bariatric) operations. It does not require any adjustments after the operation compared with an adjustable gastric band. Also, it is safer than a gastric bypass. RISKS AND COMPLICATIONS Some of the problems that can occur from this procedure include:  Infection. A germ starts growing in the incision sites. This can usually be treated with antibiotics.  Bleeding. This can occur with any surgery. Your surgeon will take all precautions to minimize this risk.  Damage to tissue or organs in the area may occur. If there is excessive damage, the surgeon may need to change to an open surgery. In this case, one large incision will be made in the center of your abdomen.  Leakage. The fluid in your stomach may leak into the abdominal cavity. If this happens, you may need another surgery to fix the leak. BEFORE THE PROCEDURE Before your operation you will meet with your surgeon and their team for the treatment of obesity. Here you will find out if you are  a candidate for bariatric surgery. The risks and the benefits of the operation will be explained. You will also meet a:  Dietician who will guide you with your preoperative and postoperative diet.  An internal medical doctor to manage your obesity related illnesses.  A psychology team to help with cravings or other mental difficulties. In addition:  You will be directed to have certain lab work and x-rays performed.  You will schedule a special test called a manometry. This test evaluates your esophagus and how it moves.  You will be placed on a special liquid diet two to three weeks before your operation. This diet helps you lose weight before the operation and decrease the amount of fat in the abdomen. It makes the operation easier for the surgeon and safer for the patient. The dietician will share the details of this with you. Before your operation:  Make sure you follow your surgeon's instructions exactly. Stop or continue medications they recommend.  Do not eat or drink anything after midnight.  Arrive at the hospital 1 hour before your surgery for check in.  Shower the morning of your operation. PROCEDURE  Most sleeve gastrectomies are performed using a laparoscope. A laparoscope is a thin, lighted, pencil-sized tube. Once you are anesthetized (asleep), the surgeon inflates your belly (abdomen) with a gas (carbon dioxide) that makes room to operate. It also makes your organs easier to see. The laparoscope is inserted into the abdomen through a small incision. Other small instruments are inserted into the abdomen   through other small incisions. During the operation, the stomach is divided using a stapler. Part of the stomach is removed through one of the incisions. The remaining stomach is reinforced using a stitch (suture) and surgical glue to prevent leakage of the gastric contents. At the end of the procedure, the gas is removed from the inside of your abdomen. The incisions are closed  with stitches. These may be covered with a dressing or left open. Because the incisions are small, there is usually minimal discomfort. You will wake up in a recovery room. Once your anesthesia has worn off, you will be moved to your hospital room. AFTER THE PROCEDURE  You will stay in the hospital, on average, for two days.  You will be given pain medication and anti-nausea medication.  You may have a drain from one of the incisions in your abdomen. This drain will stay in place until your first postoperative visit.  The nursing staff will assist you in getting out of bed the day of, or one day after, your surgery.  You will start on a liquid diet, the first day after your operation. The dietician will recommend this diet.  Taking deep breaths and coughing is very important to avoid pneumonia. Document Released: 03/25/2009 Document Revised: 08/20/2011 Document Reviewed: 03/25/2009 Exit[employer] Patient Information 2014 [employer].  

## 2013-02-20 NOTE — Progress Notes (Signed)
Chief Complaint:  Morbid obesity and prior large ventral hernia repair in [place]  History of Present Illness:  Kimberly Patton is an 57 y.o. female whose abdominal wall problems began back in the 77s when has a 7th grader, she had a large ovarian cyst removed through a large midline incision. At that time they also removed her appendix. She struggled with plate but it really came on after her children were born. She had 2 C-sections and ended up having a large complex hernia that was repaired by Dr. Tawanna Cooler Heniford in [place] (while they lived in [place]).  This involves a large piece of mesh to the lower half of her body. She developed a Upper abdominal hernia and tTodd saw her and said that he would not be inclined operate on her because of her weight. He recommended that she have bariatric surgery and he referred her to me. She is actually recently seen [name] here in the office about a sebaceous cyst and offered transfer care but she wants to be seen in care for by me at this time.  She's tried numerous diets and exercise and always seems to regain the weight and then some.  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 She wants to do this to improve her quality of life.  Will begin the process of workup for  Sleeve gastrectomy.    Past Medical History  Diagnosis Date  . ASTHMA, PERSISTENT, MODERATE 04/03/2007  . ALLERGIC RHINITIS 04/03/2007  . Hypertension   . Morbid obesity   . Paroxysmal SVT (supraventricular tachycardia)     a. 11/2011 48h Holter: RSR, rare PVC's, occas PAC's  . Dyspnea on exertion     a. 11/2007 Echo: EF 60%.    Past Surgical History  Procedure Laterality Date  . Cholecystectomy  1995  . Appendectomy  1996  . Ovarian cyst removal  1970  . Ventral hernia repair  2000  . Foot surgery  1995 / 1996    x2  . Pilonidal cyst excision  1975  . Skin cancer excision    . Tonsillectomy    . Cesarean section      x2  . US echocardiography  12/2011    WNL  - EF 55-60%, mild MR, grade 1 diastolic dysnfiction (mild)    Current Outpatient Prescriptions  Medication Sig Dispense Refill  . albuterol (PROVENTIL HFA;VENTOLIN HFA) 108 (90 BASE) MCG/ACT inhaler Inhale 2 puffs into the lungs every 6 (six) hours as needed. For wheezing      . CALCIUM PO Take by mouth daily.      . Cholecalciferol (VITAMIN D-3 PO) Take by mouth.      . diltiazem (CARDIZEM CD) 300 MG 24 hr capsule TAKE 1 CAPSULE (300 MG TOTAL) BY MOUTH DAILY.  30 capsule  0  . hydrochlorothiazide (HYDRODIURIL) 25 MG tablet Take 25 mg by mouth daily.      . lansoprazole (PREVACID) 30 MG capsule Take 30 mg by mouth daily.      . mometasone-formoterol (DULERA) 100-5 MCG/ACT AERO Inhale 2 puffs into the lungs 2 (two) times daily.  1 Inhaler  6   No current facility-administered medications for this visit.   Review of patient's allergies indicates no known allergies. Family History  Problem Relation Age of Onset  . Diabetes    . Hypertension    . Stroke    . Hemophilia    . Breast cancer Mother   . Diabetes Mother   . Hypertension  Mother   . Diabetes Father   . Hypertension Father   . Kidney failure Mother    Social History:   reports that she has never smoked. She has never used smokeless tobacco. She reports that she does not drink alcohol or use illicit drugs.   REVIEW OF SYSTEMS - PERTINENT POSITIVES ONLY: Negative for DVT. No obstructive sleep apnea.   Otherwise negative     Physical Exam:   Blood pressure 148/98, pulse 72, temperature 97.6 F (36.4 C), temperature source Temporal, resp. rate 15, height 5\' 6"  (1.676 m), weight 342 lb 12.8 oz (155.493 kg). Body mass index is 55.36 kg/(m^2).  Gen:  WDWN WF NAD  Neurological: Alert and oriented to person, place, and time. Motor and sensory function is grossly intact  Head: Normocephalic and atraumatic.  Eyes: Conjunctivae are normal. Pupils are equal, round, and reactive to light. No scleral icterus.  Neck: Normal range of  motion. Neck supple. No tracheal deviation or thyromegaly present.  Cardiovascular:  SR without murmurs or gallops.  No carotid bruits Respiratory: Effort normal.  No respiratory distress. No chest wall tenderness. Breath sounds normal.  No wheezes, rales or rhonchi.  Abdomen:  Obese with upper abdominal hernia-large.   GU: Musculoskeletal: Normal range of motion. Extremities are nontender. No cyanosis, edema or clubbing noted Lymphadenopathy: No cervical, preauricular, postauricular or axillary adenopathy is present Skin: Skin is warm and dry. No rash noted. No diaphoresis. No erythema. No pallor. Pscyh: Normal mood and affect. Behavior is normal. Judgment and thought content normal.   LABORATORY RESULTS: No results found for this or any previous visit (from the past 48 hour(s)).  RADIOLOGY RESULTS: No results found.  Problem List: Patient Active Problem List   Diagnosis Date Noted  . Obesity, morbid, BMI 50 or higher 12/08/2012  . Edema of both legs 06/13/2012  . Post-menopausal bleeding 04/07/2012  . Midsternal chest pain 12/10/2011  . Dyspnea 12/07/2011  . Splenomegaly 12/07/2011  . HTN (hypertension) 10/11/2011  . Varicose veins of lower extremities with other complications 06/18/2011  . CONTACT DERMATITIS&OTHER ECZEMA DUE TO PLANTS 10/19/2009  . HERNIA 10/11/2009  . SUPRAVENTRICULAR TACHYCARDIA 12/10/2007  . FIBROIDS, UTERUS 04/03/2007  . ALLERGIC RHINITIS 04/03/2007  . Asthma, intrinsic 04/03/2007    Assessment & Plan: Morbid obesity with desire for a sleeve gastrectomy.  Will begin the workup    Matt B. [name], MD, [facility] [phone] beeper [phone]  02/20/2013 4:43 PM

## 2013-02-23 LAB — CBC WITH DIFFERENTIAL/PLATELET
Basophils Absolute: 0 10*3/uL (ref 0.0–0.1)
Basophils Relative: 0 % (ref 0–1)
Eosinophils Absolute: 0.2 10*3/uL (ref 0.0–0.7)
Eosinophils Relative: 2 % (ref 0–5)
HCT: 37.3 % (ref 36.0–46.0)
Hemoglobin: 12.5 g/dL (ref 12.0–15.0)
Lymphocytes Relative: 28 % (ref 12–46)
Lymphs Abs: 2.7 10*3/uL (ref 0.7–4.0)
MCH: 26.7 pg (ref 26.0–34.0)
MCHC: 33.5 g/dL (ref 30.0–36.0)
MCV: 79.7 fL (ref 78.0–100.0)
Monocytes Absolute: 0.5 10*3/uL (ref 0.1–1.0)
Monocytes Relative: 5 % (ref 3–12)
Neutro Abs: 6.2 10*3/uL (ref 1.7–7.7)
Neutrophils Relative %: 65 % (ref 43–77)
Platelets: 336 10*3/uL (ref 150–400)
RBC: 4.68 MIL/uL (ref 3.87–5.11)
RDW: 15 % (ref 11.5–15.5)
WBC: 9.7 10*3/uL (ref 4.0–10.5)

## 2013-02-24 LAB — TSH: TSH: 1.04 u[IU]/mL (ref 0.350–4.500)

## 2013-02-24 LAB — T4: T4, Total: 10.9 ug/dL (ref 5.0–12.5)

## 2013-03-09 ENCOUNTER — Telehealth: Payer: Self-pay | Admitting: Family Medicine

## 2013-03-09 NOTE — Telephone Encounter (Signed)
[facility] Triage Call Report Triage Record Num: [id number] Operator: Connye Burkitt Patient Name: Kimberly Patton Call Date & Time: 03/08/2013 2:31:56PM Patient Phone: [phone] PCP: Kerby Nora Patient Gender: Female PCP Fax : [phone] Patient DOB: 1956-04-01 Practice Name: [facility] Reason for Call: Caller: [name]/Patient; PCP: Kerby Nora (Family Practice); CB#: [phone]; Call regarding Constipationx4 days; Afebrile. Took a suppository @ 12:15 with no results and took a Fleets enema. She is going to have Prep for Gastric Bypass and changed diet to high protein. All emergent signs and symptoms of Constipation protocol ruled out except 'constipation began within one week of starting new prescription, nonprescription or alternative medicine/therapy.' Home Care advice given and advised to call office in the am if no results from the suppository and Fleets Enema she is currently trying. RN/CAN advised not to use anymore otc products without a doctors ok due to electrolye potential imbalance and to call the dietician who prescribed the diet and alert her to the high protein effects for a possible solution. Nima will do that. Protocol(s) Used: Constipation Recommended Outcome per Protocol: Call Provider within 72 Hours Reason for Outcome: Constipation began within one week of starting new prescription, nonprescription or alternative medicine/therapy Care Advice: Speak with provider during regular office hours to review medication(s). Many medications (iron supplements, antidepressants, diuretics, antacids containing calcium) can contribute to constipation. ~ ~ SYMPTOM / CONDITION MANAGEMENT Medication Advice: - Discontinue all nonprescription and alternative medications, especially stimulants, until evaluated by provider. - Take prescribed medications as directed, following label instructions for the medication. - Do not change medications or dosing regimen until  provider is consulted. - Know possible side effects of medication and what to do if they occur. - Tell provider all prescription, nonprescription or alternative medications that you take ~ 03/08/2013 2:48:49PM Page 1 of 1 [email or web address]

## 2013-03-14 ENCOUNTER — Encounter: Payer: BC Managed Care – PPO | Attending: Surgery | Admitting: *Deleted

## 2013-03-14 ENCOUNTER — Encounter: Payer: Self-pay | Admitting: *Deleted

## 2013-03-14 DIAGNOSIS — E669 Obesity, unspecified: Secondary | ICD-10-CM | POA: Insufficient documentation

## 2013-03-14 DIAGNOSIS — Z713 Dietary counseling and surveillance: Secondary | ICD-10-CM | POA: Insufficient documentation

## 2013-03-14 NOTE — Patient Instructions (Addendum)
   Follow Pre-Op Nutrition Goals to prepare for Sleeve Gastrectomy Surgery.   Call the [facility] at [phone] once you have been given your surgery date to enrolled in the Pre-Op Nutrition Class. You will need to attend this nutrition class 3-4 weeks prior to your surgery.  

## 2013-03-14 NOTE — Progress Notes (Addendum)
  Pre-Op Assessment Visit:  Pre-Operative Sleeve Gastrectomy Surgery  Medical Nutrition Therapy:  Appt start time: 1030   End time:  1130.  Patient was seen on 03/14/2013 for Pre-Operative Sleeve Gastrectomy Nutrition Assessment. Assessment and letter of approval faxed to [facility] B[facility] Program coordinator on 03/14/2013.   Handouts given during visit include:  Pre-Op Goals Bariatric Surgery Protein Shakes Samples given during visit include:   Unjury Protein Powder Lot: [id number] Exp: 12/15  Patient to call the [facility] to enroll in Pre-Op and Post-Op Nutrition Education when surgery date is scheduled.

## 2013-03-20 ENCOUNTER — Ambulatory Visit (HOSPITAL_COMMUNITY)
Admission: RE | Admit: 2013-03-20 | Discharge: 2013-03-20 | Disposition: A | Discharge status: Home / Self Care | Payer: BC Managed Care – PPO | Source: Ambulatory Visit | Attending: Surgery | Admitting: Surgery

## 2013-03-20 ENCOUNTER — Other Ambulatory Visit: Payer: Self-pay

## 2013-03-20 DIAGNOSIS — R161 Splenomegaly, not elsewhere classified: Secondary | ICD-10-CM | POA: Insufficient documentation

## 2013-03-20 DIAGNOSIS — I10 Essential (primary) hypertension: Secondary | ICD-10-CM | POA: Insufficient documentation

## 2013-03-20 DIAGNOSIS — I517 Cardiomegaly: Secondary | ICD-10-CM | POA: Insufficient documentation

## 2013-03-20 DIAGNOSIS — R609 Edema, unspecified: Secondary | ICD-10-CM | POA: Insufficient documentation

## 2013-03-20 DIAGNOSIS — Z6841 Body Mass Index (BMI) 40.0 and over, adult: Secondary | ICD-10-CM | POA: Insufficient documentation

## 2013-03-20 DIAGNOSIS — R079 Chest pain, unspecified: Secondary | ICD-10-CM | POA: Insufficient documentation

## 2013-03-24 ENCOUNTER — Ambulatory Visit: Payer: BC Managed Care – PPO | Admitting: Dietician

## 2013-04-16 ENCOUNTER — Other Ambulatory Visit: Payer: Self-pay

## 2013-04-21 ENCOUNTER — Encounter: Payer: Self-pay | Admitting: Family Medicine

## 2013-04-21 ENCOUNTER — Ambulatory Visit (INDEPENDENT_AMBULATORY_CARE_PROVIDER_SITE_OTHER): Payer: BC Managed Care – PPO | Admitting: Family Medicine

## 2013-04-21 VITALS — BP 114/72 | HR 68 | Temp 97.6°F | Ht 66.0 in | Wt 322.8 lb

## 2013-04-21 DIAGNOSIS — R109 Unspecified abdominal pain: Secondary | ICD-10-CM

## 2013-04-21 LAB — POCT URINALYSIS DIPSTICK
Bilirubin, UA: NEGATIVE
Bilirubin, UA: NEGATIVE
Glucose, UA: NEGATIVE
Glucose, UA: NEGATIVE
Ketones, UA: NEGATIVE
Ketones, UA: NEGATIVE
Nitrite, UA: NEGATIVE
Nitrite, UA: NEGATIVE
Protein, UA: NEGATIVE
Protein, UA: NEGATIVE
Spec Grav, UA: <=1.005
Spec Grav, UA: 1.01
Urobilinogen, UA: 0.2
Urobilinogen, UA: 0.2
pH, UA: 7
pH, UA: 6.5

## 2013-04-21 LAB — POCT UA - MICROSCOPIC ONLY

## 2013-04-21 NOTE — Addendum Note (Signed)
Addended by: Sueanne Margarita on: 04/21/2013 04:54 PM   Modules accepted: Orders

## 2013-04-21 NOTE — Assessment & Plan Note (Addendum)
UA inconclusive... Contaminated... Not typical symtpoms or UTI/pyelo. ? Kidney stone ? Constipation ? MSK strain  Coinsider eval further with KUB for stone and impaction. Pt refused at this time. She will bring back another urine sample to try to get a non contaminated sample.

## 2013-04-21 NOTE — Progress Notes (Signed)
Pre-visit discussion using our clinic review tool. No additional management support is needed unless otherwise documented below in the visit note.  

## 2013-04-21 NOTE — Addendum Note (Signed)
Addended by: Damita Lack on: 04/21/2013 10:19 AM   Modules accepted: Orders

## 2013-04-21 NOTE — Patient Instructions (Addendum)
Push fluids. Bring back urine sample for re-eval.

## 2013-04-21 NOTE — Progress Notes (Signed)
  Subjective:    Patient ID: Kimberly Patton, female    DOB: 09/24/1955, 57 y.o.   MRN: [id number]  HPI   57 year old female presents with 2 days of right flank pain, constant. Ache, 6-7/10 on pain scale.  No abdominal pain.  No dysuria, no frequency or urgency.  Has had urine odor.  Started cranberry supplements. She is drinking some fluids but not as much as she thinks she should.  No blood in stool. No change in activity.  She has upcoming gastric sleeve... On high  protein diet ... Has lost 20 lbs.  She has had change in BMs since.. More constipated. Had to use enema and stool softner in recent past.... Straining with BMs.   No fever.  S/P cholecystectomy.     Review of Systems  Constitutional: Negative for fever and fatigue.  HENT: Negative for ear pain.   Eyes: Negative for pain.  Respiratory: Negative for chest tightness and shortness of breath.   Cardiovascular: Negative for chest pain, palpitations and leg swelling.  Gastrointestinal: Negative for abdominal pain.  Genitourinary: Negative for dysuria.       Objective:   Physical Exam  Constitutional: Vital signs are normal. She appears well-developed and well-nourished. She is cooperative.  Non-toxic appearance. She does not appear ill. No distress.  HENT:  Head: Normocephalic.  Right Ear: Hearing, tympanic membrane, external ear and ear canal normal. Tympanic membrane is not erythematous, not retracted and not bulging.  Left Ear: Hearing, tympanic membrane, external ear and ear canal normal. Tympanic membrane is not erythematous, not retracted and not bulging.  Nose: No mucosal edema or rhinorrhea. Right sinus exhibits no maxillary sinus tenderness and no frontal sinus tenderness. Left sinus exhibits no maxillary sinus tenderness and no frontal sinus tenderness.  Mouth/Throat: Uvula is midline, oropharynx is clear and moist and mucous membranes are normal.  Eyes: Conjunctivae, EOM and lids are normal. Pupils are  equal, round, and reactive to light. Lids are everted and swept, no foreign bodies found.  Neck: Trachea normal and normal range of motion. Neck supple. Carotid bruit is not present. No mass and no thyromegaly present.  Cardiovascular: Normal rate, regular rhythm, S1 normal, S2 normal, normal heart sounds, intact distal pulses and normal pulses.  Exam reveals no gallop and no friction rub.   No murmur heard. Pulmonary/Chest: Effort normal and breath sounds normal. Not tachypneic. No respiratory distress. She has no decreased breath sounds. She has no wheezes. She has no rhonchi. She has no rales.  Abdominal: Soft. Normal appearance and bowel sounds are normal. There is no tenderness. There is no rigidity, no tenderness at McBurney's point and negative Murphy's sign.  Musculoskeletal:  ttp over right mid back ttp over lumbar spine cetrally.. Pt states that always there.  Neurological: She is alert.  Skin: Skin is warm, dry and intact. No rash noted.  Psychiatric: Her speech is normal and behavior is normal. Judgment and thought content normal. Her mood appears not anxious. Cognition and memory are normal. She does not exhibit a depressed mood.          Assessment & Plan:

## 2013-04-22 LAB — URINE CULTURE: Organism ID, Bacteria: NO GROWTH

## 2013-05-01 ENCOUNTER — Ambulatory Visit (INDEPENDENT_AMBULATORY_CARE_PROVIDER_SITE_OTHER): Payer: BC Managed Care – PPO | Admitting: Surgery

## 2013-05-04 NOTE — Progress Notes (Signed)
Surgery scheduled for 06/08/13.  Preop scheduled for 05/22/13 at 0800am.  Need orders in EPIC.  Thank You.

## 2013-05-14 ENCOUNTER — Encounter: Payer: BC Managed Care – PPO | Attending: Surgery

## 2013-05-14 DIAGNOSIS — Z713 Dietary counseling and surveillance: Secondary | ICD-10-CM | POA: Insufficient documentation

## 2013-05-14 DIAGNOSIS — E669 Obesity, unspecified: Secondary | ICD-10-CM | POA: Insufficient documentation

## 2013-05-15 NOTE — Progress Notes (Signed)
Pre-Operative Nutrition Class:  Appt start time: 1730   End time:  1930  Patient was seen on 05/14/2013 for Pre-Operative Bariatric Surgery Education at [facility].   Surgery date: 06/08/2013 Surgery type: Gastric Sleeve  The following the learning objectives were met by the patient during this course:  Identify Pre-Op Dietary Goals and will begin 2 weeks pre-operatively  Identify appropriate sources of fluids and proteins   State protein recommendations and appropriate sources pre and post-operatively  Identify Post-Operative Dietary Goals and will follow for 2 weeks post-operatively  Identify appropriate multivitamin and calcium sources  Describe the need for physical activity post-operatively and will follow MD recommendations  State when to call healthcare provider regarding medication questions or post-operative complications  Handouts given during class include:  Pre-Op Bariatric Surgery Diet Handout  Protein Shake Handout  Post-Op Bariatric Surgery Nutrition Handout  [facility] Program Information Flyer  Support Group Information Flyer  [facility] Bariatric Supplements Price List  Follow-Up Plan: Patient will follow-up at [facility] 2 weeks post operatively for diet advancement per MD.   

## 2013-05-18 ENCOUNTER — Other Ambulatory Visit (INDEPENDENT_AMBULATORY_CARE_PROVIDER_SITE_OTHER): Payer: Self-pay | Admitting: Surgery

## 2013-05-20 ENCOUNTER — Encounter (HOSPITAL_COMMUNITY): Payer: Self-pay | Admitting: Emergency Medicine

## 2013-05-20 ENCOUNTER — Emergency Department (HOSPITAL_COMMUNITY): Payer: Worker's Compensation

## 2013-05-20 ENCOUNTER — Emergency Department (HOSPITAL_COMMUNITY)
Admission: EM | Admit: 2013-05-20 | Discharge: 2013-05-20 | Disposition: A | Discharge status: Home / Self Care | Payer: Worker's Compensation | Attending: Emergency Medicine | Admitting: Emergency Medicine

## 2013-05-20 DIAGNOSIS — M1710 Unilateral primary osteoarthritis, unspecified knee: Secondary | ICD-10-CM | POA: Insufficient documentation

## 2013-05-20 DIAGNOSIS — M25561 Pain in right knee: Secondary | ICD-10-CM

## 2013-05-20 DIAGNOSIS — Z79899 Other long term (current) drug therapy: Secondary | ICD-10-CM | POA: Insufficient documentation

## 2013-05-20 DIAGNOSIS — X500XXA Overexertion from strenuous movement or load, initial encounter: Secondary | ICD-10-CM | POA: Insufficient documentation

## 2013-05-20 DIAGNOSIS — IMO0002 Reserved for concepts with insufficient information to code with codable children: Secondary | ICD-10-CM | POA: Insufficient documentation

## 2013-05-20 DIAGNOSIS — S8990XA Unspecified injury of unspecified lower leg, initial encounter: Secondary | ICD-10-CM | POA: Insufficient documentation

## 2013-05-20 DIAGNOSIS — I471 Supraventricular tachycardia, unspecified: Secondary | ICD-10-CM | POA: Insufficient documentation

## 2013-05-20 DIAGNOSIS — Z1401 Asymptomatic hemophilia A carrier: Secondary | ICD-10-CM | POA: Insufficient documentation

## 2013-05-20 DIAGNOSIS — Y9389 Activity, other specified: Secondary | ICD-10-CM | POA: Insufficient documentation

## 2013-05-20 DIAGNOSIS — K219 Gastro-esophageal reflux disease without esophagitis: Secondary | ICD-10-CM | POA: Insufficient documentation

## 2013-05-20 DIAGNOSIS — Y929 Unspecified place or not applicable: Secondary | ICD-10-CM | POA: Insufficient documentation

## 2013-05-20 DIAGNOSIS — I10 Essential (primary) hypertension: Secondary | ICD-10-CM | POA: Insufficient documentation

## 2013-05-20 DIAGNOSIS — J45909 Unspecified asthma, uncomplicated: Secondary | ICD-10-CM | POA: Insufficient documentation

## 2013-05-20 MED ORDER — HYDROCODONE-ACETAMINOPHEN 5-325 MG PO TABS: 1.0000 | ORAL_TABLET | ORAL | Status: DC | PRN

## 2013-05-20 MED ORDER — HYDROCODONE-ACETAMINOPHEN 5-325 MG PO TABS
1.0000 | ORAL_TABLET | Freq: Once | ORAL | Status: AC
  Administered 2013-05-20: 1 via ORAL
  Filled 2013-05-20: qty 1

## 2013-05-20 NOTE — ED Provider Notes (Signed)
CSN: [id number]     Arrival date & time 05/20/13  1110 History   First MD Initiated Contact with Patient 05/20/13 1111     Chief Complaint  Patient presents with  . Knee Pain   (Consider location/radiation/quality/duration/timing/severity/associated sxs/prior Treatment) HPI Patient with history of chronic pain in right knee from arthritis p/w injury to right knee when a wheel came off chair she was going to sit in and she had to catch and brace herself, causing a tearing sound and sharp pain in her right knee.  Pain is posterior and lateral and radiates into shin.  Exacerbated by movement, especially attempting to straighten leg.  Denies weakness or numbness of the leg or foot.  Denies other injury.  Denies hitting head or LOC.  She did not fall to the ground. She has never seen an orthopedist previously.   Past Medical History  Diagnosis Date  . ASTHMA, PERSISTENT, MODERATE 04/03/2007  . ALLERGIC RHINITIS 04/03/2007  . Hypertension   . Morbid obesity   . Paroxysmal SVT (supraventricular tachycardia)     a. 11/2011 48h Holter: RSR, rare PVC's, occas PAC's  . Dyspnea on exertion     a. 11/2007 Echo: EF 60%.   Past Surgical History  Procedure Laterality Date  . Cholecystectomy  1995  . Appendectomy  1996  . Ovarian cyst removal  1970  . Ventral hernia repair  2000  . Foot surgery  1995 / 1996    x2  . Pilonidal cyst excision  1975  . Skin cancer excision    . Tonsillectomy    . Cesarean section      x2  . US echocardiography  12/2011    WNL - EF 55-60%, mild MR, grade 1 diastolic dysnfiction (mild)   Family History  Problem Relation Age of Onset  . Diabetes    . Hypertension    . Stroke    . Hemophilia    . Breast cancer Mother   . Diabetes Mother   . Hypertension Mother   . Diabetes Father   . Hypertension Father   . Kidney failure Mother    History  Substance Use Topics  . Smoking status: Never Smoker   . Smokeless tobacco: Never Used     Comment: smoke at age  [age]  . Alcohol Use: No   OB History   Grav Para Term Preterm Abortions TAB SAB Ect Mult Living   3 2 2  1  1   2      Review of Systems  Musculoskeletal: Positive for arthralgias. Negative for back pain and neck pain.  Skin: Negative for color change.  Neurological: Negative for weakness and numbness.    Allergies  Review of patient's allergies indicates no known allergies.  Home Medications   Current Outpatient Rx  Name  Route  Sig  Dispense  Refill  . albuterol (PROVENTIL HFA;VENTOLIN HFA) 108 (90 BASE) MCG/ACT inhaler   Inhalation   Inhale 2 puffs into the lungs every 6 (six) hours as needed. For wheezing         . CRANBERRY PO   Oral   Take 2 tablets by mouth daily.         Marland Kitchen diltiazem (CARDIZEM CD) 300 MG 24 hr capsule      TAKE 1 CAPSULE (300 MG TOTAL) BY MOUTH DAILY.   30 capsule   0     Patient needs physical appt before further refills   . hydrochlorothiazide (HYDRODIURIL) 25 MG tablet  Oral   Take 25 mg by mouth daily.         . lansoprazole (PREVACID) 30 MG capsule   Oral   Take 30 mg by mouth daily as needed.          . mometasone-formoterol (DULERA) 100-5 MCG/ACT AERO   Inhalation   Inhale 2 puffs into the lungs 2 (two) times daily.   1 Inhaler   6    There were no vitals taken for this visit. Physical Exam  Nursing note and vitals reviewed. Constitutional: She appears well-developed and well-nourished. No distress.  HENT:  Head: Normocephalic and atraumatic.  Neck: Neck supple.  Pulmonary/Chest: Effort normal.  Musculoskeletal:       Legs: Morbidly obese.  Body habitus limits physical exam.  Right knee diffusely tender to palpation and pain with any attempt to stress or move the joint.  Tender to palpation posteriorly and laterally.  No laxity of joint.  Pain with axial loading. Full active ROM by patient.  Distal pulses and sensation intact.  Right foot and ankle nontender.  No erythema, no laceration, no ecchymosis.     Neurological: She is alert.  Skin: She is not diaphoretic.    ED Course  Procedures (including critical care time) Labs Review Labs Reviewed - No data to display Imaging Review Dg Knee Complete 4 Views Right  05/20/2013   CLINICAL DATA:  Fall, twisted knee  EXAM: RIGHT KNEE - COMPLETE 4+ VIEW  COMPARISON:  None.  FINDINGS: Four views of right knee submitted. No acute fracture or subluxation. Mild narrowing of patellofemoral joint space. Minimal spurring of patella.  IMPRESSION: No acute fracture or subluxation.  Mild degenerative changes.   Electronically Signed   By: Natasha Mead M.D.   On: 05/20/2013 11:59    EKG Interpretation   None       MDM   1. Right knee pain      Pt with chronic arthritis in right knee with injury to knee while catching herself attempting not to fall from chair.  Pain is posterior and lateral.  No impact, no fall to floor.  Xray shows mild degenerative changes only.  Neurovascularly intact.  Joint is stable.  Likely soft tissue injury - unclear if muscular vs ligament vs meniscal.  D/C home with knee immobilizer and crutches/walker.  Pt to attempt crutches but due to her weight thinks she may be unable to use them, I have also written her for a walking.  Orthopedic follow up.  Discussed RICE treatment at home.  Discussed result, findings, treatment, and follow up  with patient.  Pt given return precautions.  Pt verbalizes understanding and agrees with plan.        Trixie Dredge, PA-C 05/20/13 1235

## 2013-05-20 NOTE — ED Notes (Signed)
Pt was going to sit down on a chair, when the wheel broke off. The chair fell but the patient caught herself. Pt states she heard an audible tearing sound. Swelling is present and patient is experiencing severe 10/10 pain.

## 2013-05-20 NOTE — ED Notes (Signed)
Patient transported to X-ray 

## 2013-05-20 NOTE — ED Notes (Signed)
Spoke w/Darrien w/[facility] about a walker

## 2013-05-20 NOTE — ED Provider Notes (Signed)
Medical screening examination/treatment/procedure(s) were performed by non-physician practitioner and as supervising physician I was immediately available for consultation/collaboration.  EKG Interpretation   None         Richardean Canal, MD 05/20/13 2210

## 2013-05-20 NOTE — Progress Notes (Signed)
Orthopedic Tech Progress Note Patient Details:  Kimberly Patton Aug 06, 1955 [id number]  Ortho Devices Type of Ortho Device: Knee Immobilizer Ortho Device/Splint Interventions: Application   Cammer, Mickie Bail 05/20/2013, 1:02 PM

## 2013-05-21 ENCOUNTER — Encounter (HOSPITAL_COMMUNITY): Payer: Self-pay | Admitting: Pharmacy Technician

## 2013-05-22 ENCOUNTER — Encounter (HOSPITAL_COMMUNITY): Payer: Self-pay

## 2013-05-22 ENCOUNTER — Encounter (HOSPITAL_COMMUNITY)
Admission: RE | Admit: 2013-05-22 | Discharge: 2013-05-22 | Disposition: A | Discharge status: Home / Self Care | Payer: BC Managed Care – PPO | Source: Ambulatory Visit | Attending: Surgery | Admitting: Surgery

## 2013-05-22 DIAGNOSIS — Z01812 Encounter for preprocedural laboratory examination: Secondary | ICD-10-CM | POA: Insufficient documentation

## 2013-05-22 HISTORY — DX: Pain in unspecified joint: M25.50

## 2013-05-22 HISTORY — DX: Unspecified injury of unspecified lower leg, initial encounter: S89.90XA

## 2013-05-22 HISTORY — DX: Asymptomatic hemophilia A carrier: Z14.01

## 2013-05-22 HISTORY — DX: Ventral hernia without obstruction or gangrene: K43.9

## 2013-05-22 HISTORY — DX: Gastro-esophageal reflux disease without esophagitis: K21.9

## 2013-05-22 LAB — COMPREHENSIVE METABOLIC PANEL
ALT: 19 U/L (ref 0–35)
AST: 16 U/L (ref 0–37)
Albumin: 4.2 g/dL (ref 3.5–5.2)
Alkaline Phosphatase: 92 U/L (ref 39–117)
BUN: 23 mg/dL (ref 6–23)
CO2: 31 mEq/L (ref 19–32)
Calcium: 9.7 mg/dL (ref 8.4–10.5)
Chloride: 96 mEq/L (ref 96–112)
Creatinine, Ser: 0.48 mg/dL — ABNORMAL LOW (ref 0.50–1.10)
GFR calc Af Amer: >90 mL/min (ref >90)
GFR calc non Af Amer: >90 mL/min (ref >90)
Glucose, Bld: 94 mg/dL (ref 70–99)
Potassium: 3.8 mEq/L (ref 3.5–5.1)
Sodium: 137 mEq/L (ref 135–145)
Total Bilirubin: 0.4 mg/dL (ref 0.3–1.2)
Total Protein: 7.7 g/dL (ref 6.0–8.3)

## 2013-05-22 LAB — CBC WITH DIFFERENTIAL/PLATELET
Basophils Absolute: 0 10*3/uL (ref 0.0–0.1)
Basophils Relative: 0 % (ref 0–1)
Eosinophils Absolute: 0.1 10*3/uL (ref 0.0–0.7)
Eosinophils Relative: 2 % (ref 0–5)
HCT: 37.4 % (ref 36.0–46.0)
Hemoglobin: 12.4 g/dL (ref 12.0–15.0)
Lymphocytes Relative: 25 % (ref 12–46)
Lymphs Abs: 1.8 10*3/uL (ref 0.7–4.0)
MCH: 27.3 pg (ref 26.0–34.0)
MCHC: 33.2 g/dL (ref 30.0–36.0)
MCV: 82.4 fL (ref 78.0–100.0)
Monocytes Absolute: 0.3 10*3/uL (ref 0.1–1.0)
Monocytes Relative: 5 % (ref 3–12)
Neutro Abs: 4.8 10*3/uL (ref 1.7–7.7)
Neutrophils Relative %: 68 % (ref 43–77)
Platelets: 289 10*3/uL (ref 150–400)
RBC: 4.54 MIL/uL (ref 3.87–5.11)
RDW: 14.8 % (ref 11.5–15.5)
WBC: 7.1 10*3/uL (ref 4.0–10.5)

## 2013-05-22 NOTE — Progress Notes (Signed)
Portable quip notified or need for BERI bed

## 2013-05-22 NOTE — Patient Instructions (Addendum)
Kimberly Patton  05/22/2013                           YOUR PROCEDURE IS SCHEDULED ON: 06/08/13               PLEASE REPORT TO SHORT STAY CENTER AT : 5:15 AM               CALL THIS NUMBER IF ANY PROBLEMS THE DAY OF SURGERY :               [phone]                      REMEMBER:   Do not eat food or drink liquids AFTER MIDNIGHT   Take these medicines the morning of surgery with A SIP OF WATER:  PREVACID / MAY USE ALBUTEROL /HYDROCODONE IF NEEDED   Do not wear jewelry, make-up   Do not wear lotions, powders, or perfumes.   Do not shave legs or underarms 12 hrs. before surgery (men may shave face)  Do not bring valuables to the hospital.  Contacts, dentures or bridgework may not be worn into surgery.  Leave suitcase in the car. After surgery it may be brought to your room.  For patients admitted to the hospital more than one night, checkout time is 11:00                          The day of discharge.   Patients discharged the day of surgery will not be allowed to drive home                             If going home same day of surgery, must have someone stay with you first                           24 hrs at home and arrange for some one to drive you home from hospital.    Special Instructions:   Please read over the following fact sheets that you were given:               1.INCENTIVE SPIROMETER                     2. [place] PREPARING FOR SURGERY SHEET                3. STOP ASPIRIN / HERBAL MEDS 5 DAYS PREOP                                                X_____________________________________________________________________        Failure to follow these instructions may result in cancellation of your surgery

## 2013-05-28 ENCOUNTER — Telehealth: Payer: Self-pay | Admitting: Family Medicine

## 2013-05-28 NOTE — Telephone Encounter (Signed)
Noted  

## 2013-05-28 NOTE — Telephone Encounter (Signed)
Patient Information:  Caller Name: Wilber Oliphant  Phone: [phone]  Patient: Kimberly Patton, Kimberly Patton  Gender: Female  DOB: 01-29-1956  Age: 57 Years  PCP: Kerby Nora (Family Practice)  Office Follow Up:  Does the office need to follow up with this patient?: No  Instructions For The Office: N/A  RN Note:  Sudden onset of fever/tactile, cough 05/28/13.  Patient is not with caller; states has had fracture of the knee and is at work, but cannot get to a phone.  Wants "an antibiotic or whatever is needed for this to be called in to the pharmacy."  Advised that this may be seasonal influenza, which would require Tamiflu rather than antibiotic, but patient would need evaluation/exam to determine which medication might be necessary.  No appts available in Epic for 05/28/13 as requested; states will go to [facility].  krs/can  Symptoms  Reason For Call & Symptoms: fever, cough  Reviewed Health History In EMR: Yes  Reviewed Medications In EMR: Yes  Reviewed Allergies In EMR: Yes  Reviewed Surgeries / Procedures: Yes  Date of Onset of Symptoms: 05/28/2013  Any Fever: Yes  Fever Taken: Tactile  Fever Time Of Reading: 16:00:00  Fever Last Reading: N/A  Guideline(s) Used:  No Protocol Available - Sick Adult  Disposition Per Guideline:   See Today in Office  Reason For Disposition Reached:   Nursing judgment  Advice Given:  Call Back If:  New symptoms develop  You become worse.  Patient Will Follow Care Advice:  YES

## 2013-05-29 ENCOUNTER — Encounter (INDEPENDENT_AMBULATORY_CARE_PROVIDER_SITE_OTHER): Payer: Self-pay | Admitting: Surgery

## 2013-05-29 ENCOUNTER — Ambulatory Visit (INDEPENDENT_AMBULATORY_CARE_PROVIDER_SITE_OTHER): Payer: BC Managed Care – PPO | Admitting: Surgery

## 2013-05-29 NOTE — Progress Notes (Signed)
Chief Complaint: Morbid obesity and prior large ventral hernia repair in [place]  History of Present Illness: Kimberly Patton is an 57 y.o. female whose abdominal wall problems began back in the 37s when has a 7th grader, she had a large ovarian cyst removed through a large midline incision. At that time they also removed her appendix. She struggled with plate but it really came on after her children were born. She had 2 C-sections and ended up having a large complex hernia that was repaired by Dr. Tawanna Cooler Patton in [place] (while they lived in [place]). This involves a large piece of mesh to the lower half of her body. She developed a  Upper abdominal hernia and Kimberly Patton saw her and said that he would not be inclined operate on her because of her weight. He recommended that she have bariatric surgery and he referred her to me. She is actually recently seen Kimberly Patton here in the office about a sebaceous cyst and offered transfer care but she wants to be seen in care for by me at this time.  She's tried numerous diets and exercise and always seems to regain the weight and then some. 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 She wants to do this to improve her quality of life. Will begin the process of workup for Sleeve gastrectomy.   UGI normal.  CXR is normal.  She is ready for surgery.  She has lost  Weight and along the way is having some increased pain in her hernia.  Wants me to look at it but I indicated that I would not put mesh in area after gastrectomy.  Past Medical History   Diagnosis  Date   .  ASTHMA, PERSISTENT, MODERATE  04/03/2007   .  ALLERGIC RHINITIS  04/03/2007   .  Hypertension    .  Morbid obesity    .  Paroxysmal SVT (supraventricular tachycardia)      a. 11/2011 48h Holter: RSR, rare PVC's, occas PAC's   .  Dyspnea on exertion      a. 11/2007 Echo: EF 60%.    Past Surgical History   Procedure  Laterality  Date   .  Cholecystectomy   1995   .  Appendectomy    1996   .  Ovarian cyst removal   1970   .  Ventral hernia repair   2000   .  Foot surgery   1995 / 1996     x2   .  Pilonidal cyst excision   1975   .  Skin cancer excision     .  Tonsillectomy     .  Cesarean section       x2   .  US echocardiography   12/2011     WNL - EF 55-60%, mild MR, grade 1 diastolic dysnfiction (mild)    Current Outpatient Prescriptions   Medication  Sig  Dispense  Refill   .  albuterol (PROVENTIL HFA;VENTOLIN HFA) 108 (90 BASE) MCG/ACT inhaler  Inhale 2 puffs into the lungs every 6 (six) hours as needed. For wheezing     .  CALCIUM PO  Take by mouth daily.     .  Cholecalciferol (VITAMIN D-3 PO)  Take by mouth.     .  diltiazem (CARDIZEM CD) 300 MG 24 hr capsule  TAKE 1 CAPSULE (300 MG TOTAL) BY MOUTH DAILY.  30 capsule  0   .  hydrochlorothiazide (HYDRODIURIL) 25 MG tablet  Take  25 mg by mouth daily.     .  lansoprazole (PREVACID) 30 MG capsule  Take 30 mg by mouth daily.     .  mometasone-formoterol (DULERA) 100-5 MCG/ACT AERO  Inhale 2 puffs into the lungs 2 (two) times daily.  1 Inhaler  6    No current facility-administered medications for this visit.   Review of patient's allergies indicates no known allergies.  Family History   Problem  Relation  Age of Onset   .  Diabetes     .  Hypertension     .  Stroke     .  Hemophilia     .  Breast cancer  Mother    .  Diabetes  Mother    .  Hypertension  Mother    .  Diabetes  Father    .  Hypertension  Father    .  Kidney failure  Mother    Social History: reports that she has never smoked. She has never used smokeless tobacco. She reports that she does not drink alcohol or use illicit drugs.  REVIEW OF SYSTEMS - PERTINENT POSITIVES ONLY:  Negative for DVT. No obstructive sleep apnea. Otherwise negative  Physical Exam:  Blood pressure 148/98, pulse 72, temperature 97.6 F (36.4 C), temperature source Temporal, resp. rate 15, height 5\' 6"  (1.676 m), weight 342 lb 12.8 oz (155.493 kg).  Body mass index  is 55.36 kg/(m^2).  Gen: WDWN WF NAD  Neurological: Alert and oriented to person, place, and time. Motor and sensory function is grossly intact  Head: Normocephalic and atraumatic.  Eyes: Conjunctivae are normal. Pupils are equal, round, and reactive to light. No scleral icterus.  Neck: Normal range of motion. Neck supple. No tracheal deviation or thyromegaly present.  Cardiovascular: SR without murmurs or gallops. No carotid bruits  Respiratory: Effort normal. No respiratory distress. No chest wall tenderness. Breath sounds normal. No wheezes, rales or rhonchi.  Abdomen: Obese with upper abdominal hernia-large. Having some pain in hernia.  GU:  Musculoskeletal: Normal range of motion. Extremities are nontender. No cyanosis, edema or clubbing noted Lymphadenopathy: No cervical, preauricular, postauricular or axillary adenopathy is present Skin: Skin is warm and dry. No rash noted. No diaphoresis. No erythema. No pallor. Pscyh: Normal mood and affect. Behavior is normal. Judgment and thought content normal.  LABORATORY RESULTS:  No results found for this or any previous visit (from the past 48 hour(s)).  RADIOLOGY RESULTS:  No results found.  Problem List:  Patient Active Problem List    Diagnosis  Date Noted   .  Obesity, morbid, BMI 50 or higher  12/08/2012   .  Edema of both legs  06/13/2012   .  Post-menopausal bleeding  04/07/2012   .  Midsternal chest pain  12/10/2011   .  Dyspnea  12/07/2011   .  Splenomegaly  12/07/2011   .  HTN (hypertension)  10/11/2011   .  Varicose veins of lower extremities with other complications  06/18/2011   .  CONTACT DERMATITIS&OTHER ECZEMA DUE TO PLANTS  10/19/2009   .  HERNIA  10/11/2009   .  SUPRAVENTRICULAR TACHYCARDIA  12/10/2007   .  FIBROIDS, UTERUS  04/03/2007   .  ALLERGIC RHINITIS  04/03/2007   .  Asthma, intrinsic  04/03/2007   Assessment & Plan:  Morbid obesity with desire for a sleeve gastrectomy on Dec 29th Orders in [facility].  [name], MD, [facility]  [phone] beeper  [phone]     

## 2013-05-29 NOTE — Patient Instructions (Signed)

## 2013-06-03 ENCOUNTER — Encounter (INDEPENDENT_AMBULATORY_CARE_PROVIDER_SITE_OTHER): Payer: BC Managed Care – PPO | Admitting: Surgery

## 2013-06-08 ENCOUNTER — Encounter (HOSPITAL_COMMUNITY)
Admission: RE | Disposition: A | Discharge status: Home / Self Care | Payer: Self-pay | Source: Ambulatory Visit | Attending: Surgery

## 2013-06-08 ENCOUNTER — Encounter (HOSPITAL_COMMUNITY): Payer: BC Managed Care – PPO | Admitting: Anesthesiology

## 2013-06-08 ENCOUNTER — Inpatient Hospital Stay (HOSPITAL_COMMUNITY)
Admission: RE | Admit: 2013-06-08 | Discharge: 2013-06-10 | DRG: 620 | Disposition: A | Discharge status: Home / Self Care | Payer: BC Managed Care – PPO | Source: Ambulatory Visit | Attending: Surgery | Admitting: Surgery

## 2013-06-08 ENCOUNTER — Encounter (HOSPITAL_COMMUNITY): Payer: Self-pay

## 2013-06-08 ENCOUNTER — Other Ambulatory Visit: Payer: Self-pay | Admitting: Family Medicine

## 2013-06-08 ENCOUNTER — Ambulatory Visit (HOSPITAL_COMMUNITY): Payer: BC Managed Care – PPO | Admitting: Anesthesiology

## 2013-06-08 DIAGNOSIS — K219 Gastro-esophageal reflux disease without esophagitis: Secondary | ICD-10-CM | POA: Diagnosis present

## 2013-06-08 DIAGNOSIS — K660 Peritoneal adhesions (postprocedural) (postinfection): Secondary | ICD-10-CM | POA: Diagnosis present

## 2013-06-08 DIAGNOSIS — K460 Unspecified abdominal hernia with obstruction, without gangrene: Secondary | ICD-10-CM | POA: Diagnosis present

## 2013-06-08 DIAGNOSIS — Z9884 Bariatric surgery status: Secondary | ICD-10-CM

## 2013-06-08 DIAGNOSIS — Z833 Family history of diabetes mellitus: Secondary | ICD-10-CM

## 2013-06-08 DIAGNOSIS — Z01812 Encounter for preprocedural laboratory examination: Secondary | ICD-10-CM

## 2013-06-08 DIAGNOSIS — Z823 Family history of stroke: Secondary | ICD-10-CM

## 2013-06-08 DIAGNOSIS — Z6841 Body Mass Index (BMI) 40.0 and over, adult: Secondary | ICD-10-CM

## 2013-06-08 DIAGNOSIS — Z803 Family history of malignant neoplasm of breast: Secondary | ICD-10-CM

## 2013-06-08 DIAGNOSIS — I739 Peripheral vascular disease, unspecified: Secondary | ICD-10-CM | POA: Diagnosis present

## 2013-06-08 DIAGNOSIS — I10 Essential (primary) hypertension: Secondary | ICD-10-CM

## 2013-06-08 DIAGNOSIS — Z8249 Family history of ischemic heart disease and other diseases of the circulatory system: Secondary | ICD-10-CM

## 2013-06-08 DIAGNOSIS — L723 Sebaceous cyst: Secondary | ICD-10-CM | POA: Diagnosis present

## 2013-06-08 DIAGNOSIS — Z85828 Personal history of other malignant neoplasm of skin: Secondary | ICD-10-CM

## 2013-06-08 DIAGNOSIS — J45909 Unspecified asthma, uncomplicated: Secondary | ICD-10-CM | POA: Diagnosis present

## 2013-06-08 HISTORY — PX: LAPAROSCOPIC GASTRIC SLEEVE RESECTION: SHX5895

## 2013-06-08 HISTORY — PX: UPPER GI ENDOSCOPY: SHX6162

## 2013-06-08 LAB — HEMOGLOBIN AND HEMATOCRIT, BLOOD
HCT: 37.3 % (ref 36.0–46.0)
Hemoglobin: 12.7 g/dL (ref 12.0–15.0)

## 2013-06-08 LAB — CBC
HCT: 39.6 % (ref 36.0–46.0)
Hemoglobin: 13.1 g/dL (ref 12.0–15.0)
MCH: 26.8 pg (ref 26.0–34.0)
MCHC: 33.1 g/dL (ref 30.0–36.0)
MCV: 81.1 fL (ref 78.0–100.0)
Platelets: 277 10*3/uL (ref 150–400)
RBC: 4.88 MIL/uL (ref 3.87–5.11)
RDW: 14.4 % (ref 11.5–15.5)
WBC: 15.5 10*3/uL — ABNORMAL HIGH (ref 4.0–10.5)

## 2013-06-08 LAB — CREATININE, SERUM
Creatinine, Ser: 0.61 mg/dL (ref 0.50–1.10)
GFR calc Af Amer: >90 mL/min (ref >90)
GFR calc non Af Amer: >90 mL/min (ref >90)

## 2013-06-08 SURGERY — GASTRECTOMY, SLEEVE, LAPAROSCOPIC
Anesthesia: General | Site: Esophagus

## 2013-06-08 MED ORDER — NEOSTIGMINE METHYLSULFATE 1 MG/ML IJ SOLN
INTRAMUSCULAR | Status: AC
  Filled 2013-06-08: qty 10

## 2013-06-08 MED ORDER — TISSEEL VH 10 ML EX KIT
PACK | CUTANEOUS | Status: AC
  Filled 2013-06-08: qty 2

## 2013-06-08 MED ORDER — SUCCINYLCHOLINE CHLORIDE 20 MG/ML IJ SOLN
INTRAMUSCULAR | Status: DC | PRN
  Administered 2013-06-08: 140 mg via INTRAVENOUS

## 2013-06-08 MED ORDER — HYDROMORPHONE HCL PF 2 MG/ML IJ SOLN
INTRAMUSCULAR | Status: AC
  Filled 2013-06-08: qty 1

## 2013-06-08 MED ORDER — GLYCOPYRROLATE 0.2 MG/ML IJ SOLN
INTRAMUSCULAR | Status: AC
  Filled 2013-06-08: qty 2

## 2013-06-08 MED ORDER — FENTANYL CITRATE 0.05 MG/ML IJ SOLN
INTRAMUSCULAR | Status: AC
  Filled 2013-06-08: qty 5

## 2013-06-08 MED ORDER — PROPOFOL 10 MG/ML IV BOLUS
INTRAVENOUS | Status: AC
  Filled 2013-06-08: qty 20

## 2013-06-08 MED ORDER — BUPIVACAINE LIPOSOME 1.3 % IJ SUSP
20.0000 mL | Freq: Once | INTRAMUSCULAR | Status: DC
  Filled 2013-06-08: qty 20

## 2013-06-08 MED ORDER — ACETAMINOPHEN 160 MG/5ML PO SOLN: 650.0000 mg | ORAL | Status: DC | PRN

## 2013-06-08 MED ORDER — LACTATED RINGERS IR SOLN
Status: DC | PRN
  Administered 2013-06-08: 3000 mL

## 2013-06-08 MED ORDER — MIDAZOLAM HCL 5 MG/5ML IJ SOLN
INTRAMUSCULAR | Status: DC | PRN
  Administered 2013-06-08: 1 mg via INTRAVENOUS

## 2013-06-08 MED ORDER — 0.9 % SODIUM CHLORIDE (POUR BTL) OPTIME
TOPICAL | Status: DC | PRN
  Administered 2013-06-08: 1000 mL

## 2013-06-08 MED ORDER — TISSEEL VH 10 ML EX KIT
PACK | CUTANEOUS | Status: DC | PRN
  Administered 2013-06-08: 1

## 2013-06-08 MED ORDER — HEPARIN SODIUM (PORCINE) 5000 UNIT/ML IJ SOLN
5000.0000 [IU] | INTRAMUSCULAR | Status: AC
  Administered 2013-06-08: 5000 [IU] via SUBCUTANEOUS
  Filled 2013-06-08: qty 1

## 2013-06-08 MED ORDER — DEXTROSE 5 % IV SOLN
INTRAVENOUS | Status: AC
  Filled 2013-06-08 (×2): qty 1

## 2013-06-08 MED ORDER — HEPARIN SODIUM (PORCINE) 5000 UNIT/ML IJ SOLN
5000.0000 [IU] | Freq: Three times a day (TID) | INTRAMUSCULAR | Status: DC
  Administered 2013-06-08 – 2013-06-10 (×5): 5000 [IU] via SUBCUTANEOUS
  Filled 2013-06-08 (×8): qty 1

## 2013-06-08 MED ORDER — MIDAZOLAM HCL 2 MG/2ML IJ SOLN
INTRAMUSCULAR | Status: AC
  Filled 2013-06-08: qty 2

## 2013-06-08 MED ORDER — EPHEDRINE SULFATE 50 MG/ML IJ SOLN
INTRAMUSCULAR | Status: AC
Start: 2013-06-08 — End: 2013-06-08
  Filled 2013-06-08: qty 1

## 2013-06-08 MED ORDER — ONDANSETRON HCL 4 MG/2ML IJ SOLN: 4.0000 mg | INTRAMUSCULAR | Status: DC | PRN

## 2013-06-08 MED ORDER — LACTATED RINGERS IV SOLN: INTRAVENOUS | Status: DC

## 2013-06-08 MED ORDER — ONDANSETRON HCL 4 MG/2ML IJ SOLN
INTRAMUSCULAR | Status: DC | PRN
  Administered 2013-06-08 (×2): 2 mg via INTRAVENOUS

## 2013-06-08 MED ORDER — LIP MEDEX EX OINT
TOPICAL_OINTMENT | CUTANEOUS | Status: AC
  Administered 2013-06-08: 15:00:00
  Filled 2013-06-08: qty 7

## 2013-06-08 MED ORDER — KETAMINE HCL 10 MG/ML IJ SOLN
INTRAMUSCULAR | Status: AC
  Filled 2013-06-08: qty 1

## 2013-06-08 MED ORDER — OXYCODONE-ACETAMINOPHEN 5-325 MG/5ML PO SOLN
5.0000 mL | ORAL | Status: DC | PRN
  Administered 2013-06-09 (×2): 10 mL via ORAL
  Filled 2013-06-08 (×2): qty 10

## 2013-06-08 MED ORDER — ONDANSETRON HCL 4 MG/2ML IJ SOLN
INTRAMUSCULAR | Status: AC
Start: 2013-06-08 — End: 2013-06-08
  Filled 2013-06-08: qty 2

## 2013-06-08 MED ORDER — ALBUTEROL SULFATE HFA 108 (90 BASE) MCG/ACT IN AERS
2.0000 | INHALATION_SPRAY | Freq: Four times a day (QID) | RESPIRATORY_TRACT | Status: DC | PRN
  Filled 2013-06-08: qty 6.7

## 2013-06-08 MED ORDER — EPHEDRINE SULFATE 50 MG/ML IJ SOLN
INTRAMUSCULAR | Status: DC | PRN
  Administered 2013-06-08: 10 mg via INTRAVENOUS

## 2013-06-08 MED ORDER — KCL IN DEXTROSE-NACL 20-5-0.45 MEQ/L-%-% IV SOLN
INTRAVENOUS | Status: DC
  Administered 2013-06-08 – 2013-06-10 (×5): via INTRAVENOUS
  Filled 2013-06-08 (×9): qty 1000

## 2013-06-08 MED ORDER — MORPHINE SULFATE 2 MG/ML IJ SOLN
2.0000 mg | INTRAMUSCULAR | Status: DC | PRN
  Administered 2013-06-08: 2 mg via INTRAVENOUS
  Administered 2013-06-08: 4 mg via INTRAVENOUS
  Administered 2013-06-09 (×2): 2 mg via INTRAVENOUS
  Filled 2013-06-08: qty 1
  Filled 2013-06-08: qty 2
  Filled 2013-06-08 (×2): qty 1

## 2013-06-08 MED ORDER — UNJURY VANILLA POWDER: 2.0000 [oz_av] | Freq: Four times a day (QID) | ORAL | Status: DC

## 2013-06-08 MED ORDER — FENTANYL CITRATE 0.05 MG/ML IJ SOLN
INTRAMUSCULAR | Status: DC | PRN
  Administered 2013-06-08 (×3): 50 ug via INTRAVENOUS
  Administered 2013-06-08: 100 ug via INTRAVENOUS

## 2013-06-08 MED ORDER — UNJURY CHICKEN SOUP POWDER: 2.0000 [oz_av] | Freq: Four times a day (QID) | ORAL | Status: DC

## 2013-06-08 MED ORDER — KETAMINE HCL 10 MG/ML IJ SOLN: INTRAMUSCULAR | Status: DC | PRN

## 2013-06-08 MED ORDER — UNJURY CHOCOLATE CLASSIC POWDER
2.0000 [oz_av] | Freq: Four times a day (QID) | ORAL | Status: DC
  Administered 2013-06-10: 2 [oz_av] via ORAL

## 2013-06-08 MED ORDER — PROMETHAZINE HCL 25 MG/ML IJ SOLN
6.2500 mg | INTRAMUSCULAR | Status: DC | PRN
  Administered 2013-06-08: 6.25 mg via INTRAVENOUS

## 2013-06-08 MED ORDER — DEXAMETHASONE SODIUM PHOSPHATE 4 MG/ML IJ SOLN
INTRAMUSCULAR | Status: DC | PRN
  Administered 2013-06-08: 10 mg via INTRAVENOUS

## 2013-06-08 MED ORDER — DEXTROSE 5 % IV SOLN
2.0000 g | INTRAVENOUS | Status: AC
  Administered 2013-06-08: 2 g via INTRAVENOUS
  Filled 2013-06-08: qty 2

## 2013-06-08 MED ORDER — BUPIVACAINE LIPOSOME 1.3 % IJ SUSP
INTRAMUSCULAR | Status: DC | PRN
  Administered 2013-06-08: 20 mL

## 2013-06-08 MED ORDER — LIDOCAINE HCL (CARDIAC) 20 MG/ML IV SOLN
INTRAVENOUS | Status: AC
  Filled 2013-06-08: qty 5

## 2013-06-08 MED ORDER — CISATRACURIUM BESYLATE 20 MG/10ML IV SOLN
INTRAVENOUS | Status: AC
  Filled 2013-06-08: qty 10

## 2013-06-08 MED ORDER — CISATRACURIUM BESYLATE (PF) 10 MG/5ML IV SOLN
INTRAVENOUS | Status: DC | PRN
  Administered 2013-06-08: 3 mg via INTRAVENOUS
  Administered 2013-06-08: 1 mg via INTRAVENOUS
  Administered 2013-06-08: 10 mg via INTRAVENOUS

## 2013-06-08 MED ORDER — GLYCOPYRROLATE 0.2 MG/ML IJ SOLN
INTRAMUSCULAR | Status: DC | PRN
  Administered 2013-06-08: 0.4 mg via INTRAVENOUS

## 2013-06-08 MED ORDER — LACTATED RINGERS IV SOLN
INTRAVENOUS | Status: DC | PRN
  Administered 2013-06-08 (×2): via INTRAVENOUS

## 2013-06-08 MED ORDER — LIDOCAINE HCL (CARDIAC) 20 MG/ML IV SOLN
INTRAVENOUS | Status: DC | PRN
  Administered 2013-06-08: 30 mg via INTRAVENOUS

## 2013-06-08 MED ORDER — DEXAMETHASONE SODIUM PHOSPHATE 10 MG/ML IJ SOLN
INTRAMUSCULAR | Status: AC
  Filled 2013-06-08: qty 1

## 2013-06-08 MED ORDER — HYDROMORPHONE HCL PF 1 MG/ML IJ SOLN
INTRAMUSCULAR | Status: DC | PRN
  Administered 2013-06-08 (×2): 0.5 mg via INTRAVENOUS

## 2013-06-08 MED ORDER — GLYCOPYRROLATE 0.2 MG/ML IJ SOLN
INTRAMUSCULAR | Status: AC
  Filled 2013-06-08: qty 1

## 2013-06-08 MED ORDER — SODIUM CHLORIDE 0.9 % IJ SOLN
INTRAMUSCULAR | Status: AC
  Filled 2013-06-08: qty 10

## 2013-06-08 MED ORDER — HYDROMORPHONE HCL PF 1 MG/ML IJ SOLN: 0.2500 mg | INTRAMUSCULAR | Status: DC | PRN

## 2013-06-08 MED ORDER — NEOSTIGMINE METHYLSULFATE 1 MG/ML IJ SOLN
INTRAMUSCULAR | Status: DC | PRN
  Administered 2013-06-08: 3 mg via INTRAVENOUS

## 2013-06-08 MED ORDER — PROMETHAZINE HCL 25 MG/ML IJ SOLN
INTRAMUSCULAR | Status: AC
  Filled 2013-06-08: qty 1

## 2013-06-08 MED ORDER — PROPOFOL 10 MG/ML IV BOLUS
INTRAVENOUS | Status: DC | PRN
  Administered 2013-06-08: 250 mg via INTRAVENOUS

## 2013-06-08 MED ORDER — KETAMINE HCL 10 MG/ML IJ SOLN
INTRAMUSCULAR | Status: DC | PRN
  Administered 2013-06-08: 25 mg via INTRAVENOUS

## 2013-06-08 SURGICAL SUPPLY — 64 items
APPLICATOR COTTON TIP 6IN STRL (MISCELLANEOUS) ×6 IMPLANT
APPLIER CLIP ROT 10 11.4 M/L (STAPLE)
BENZOIN TINCTURE PRP APPL 2/3 (GAUZE/BANDAGES/DRESSINGS) ×3 IMPLANT
BLADE HEX COATED 2.75 (ELECTRODE) ×3 IMPLANT
BLADE SURG 15 STRL LF DISP TIS (BLADE) ×2 IMPLANT
BLADE SURG 15 STRL SS (BLADE) ×1
CABLE HIGH FREQUENCY MONO STRZ (ELECTRODE) IMPLANT
CLIP APPLIE ROT 10 11.4 M/L (STAPLE) IMPLANT
DERMABOND ADVANCED (GAUZE/BANDAGES/DRESSINGS) ×1
DERMABOND ADVANCED .7 DNX12 (GAUZE/BANDAGES/DRESSINGS) ×2 IMPLANT
DEVICE SUT QUICK LOAD TK 5 (STAPLE) IMPLANT
DEVICE SUT TI-KNOT TK 5X26 (MISCELLANEOUS) IMPLANT
DEVICE SUTURE ENDOST 10MM (ENDOMECHANICALS) IMPLANT
DEVICE TROCAR PUNCTURE CLOSURE (ENDOMECHANICALS) IMPLANT
DRAIN CHANNEL 19F RND (DRAIN) IMPLANT
DRAPE CAMERA CLOSED 9X96 (DRAPES) ×3 IMPLANT
DRSG TEGADERM 2-3/8X2-3/4 SM (GAUZE/BANDAGES/DRESSINGS) ×3 IMPLANT
ELECT REM PT RETURN 9FT ADLT (ELECTROSURGICAL) ×3
ELECTRODE REM PT RTRN 9FT ADLT (ELECTROSURGICAL) ×2 IMPLANT
EVACUATOR SILICONE 100CC (DRAIN) IMPLANT
GAUZE SPONGE 2X2 8PLY STRL LF (GAUZE/BANDAGES/DRESSINGS) ×2 IMPLANT
GLOVE BIOGEL M 8.0 STRL (GLOVE) ×3 IMPLANT
GOWN STRL REIN XL XLG (GOWN DISPOSABLE) ×12 IMPLANT
HOVERMATT SINGLE USE (MISCELLANEOUS) ×3 IMPLANT
KIT BASIN OR (CUSTOM PROCEDURE TRAY) ×3 IMPLANT
NEEDLE SPNL 22GX3.5 QUINCKE BK (NEEDLE) ×3 IMPLANT
PACK UNIVERSAL I (CUSTOM PROCEDURE TRAY) ×3 IMPLANT
PENCIL BUTTON HOLSTER BLD 10FT (ELECTRODE) ×3 IMPLANT
RELOAD BLUE (STAPLE) ×12 IMPLANT
RELOAD ENDO STITCH (ENDOMECHANICALS) ×6 IMPLANT
RELOAD GREEN (STAPLE) IMPLANT
SCISSORS LAP 5X45 EPIX DISP (ENDOMECHANICALS) ×3 IMPLANT
SCRUB PCMX 4 OZ (MISCELLANEOUS) ×6 IMPLANT
SEALANT SURGICAL APPL DUAL CAN (MISCELLANEOUS) ×3 IMPLANT
SET IRRIG TUBING LAPAROSCOPIC (IRRIGATION / IRRIGATOR) ×3 IMPLANT
SHEARS CURVED HARMONIC AC 45CM (MISCELLANEOUS) ×3 IMPLANT
SLEEVE ADV FIXATION 12X100MM (TROCAR) ×3 IMPLANT
SLEEVE ADV FIXATION 5X100MM (TROCAR) ×3 IMPLANT
SLEEVE GASTRECTOMY 36FR VISIGI (MISCELLANEOUS) ×3 IMPLANT
SLEEVE XCEL OPT CAN 5 100 (ENDOMECHANICALS) ×3 IMPLANT
SOLUTION ANTI FOG 6CC (MISCELLANEOUS) ×3 IMPLANT
SPONGE GAUZE 2X2 STER 10/PKG (GAUZE/BANDAGES/DRESSINGS) ×1
SPONGE GAUZE 4X4 12PLY (GAUZE/BANDAGES/DRESSINGS) IMPLANT
SPONGE LAP 18X18 X RAY DECT (DISPOSABLE) ×3 IMPLANT
STAPLE ECHEON FLEX 60 POW ENDO (STAPLE) ×3 IMPLANT
STRIP CLOSURE SKIN 1/2X4 (GAUZE/BANDAGES/DRESSINGS) ×3 IMPLANT
SUT ETHILON 2 0 PS N (SUTURE) IMPLANT
SUT NOVA NAB DX-16 0-1 5-0 T12 (SUTURE) ×6 IMPLANT
SUT VIC AB 3-0 SH 27 (SUTURE) ×1
SUT VIC AB 3-0 SH 27X BRD (SUTURE) ×2 IMPLANT
SUT VIC AB 4-0 SH 18 (SUTURE) ×3 IMPLANT
SYR 20CC LL (SYRINGE) ×3 IMPLANT
SYR 50ML LL SCALE MARK (SYRINGE) ×3 IMPLANT
TOWEL OR 17X26 10 PK STRL BLUE (TOWEL DISPOSABLE) ×3 IMPLANT
TOWEL OR NON WOVEN STRL DISP B (DISPOSABLE) ×3 IMPLANT
TRAY FOLEY CATH 14FRSI W/METER (CATHETERS) ×3 IMPLANT
TROCAR ADV FIXATION 12X100MM (TROCAR) ×3 IMPLANT
TROCAR ADV FIXATION 5X100MM (TROCAR) ×3 IMPLANT
TROCAR BLADELESS 15MM (ENDOMECHANICALS) ×3 IMPLANT
TROCAR XCEL NON-BLD 5MMX100MML (ENDOMECHANICALS) ×3 IMPLANT
TUBING CONNECTING 10 (TUBING) ×6 IMPLANT
TUBING ENDO SMARTCAP (MISCELLANEOUS) IMPLANT
TUBING ENDO SMARTCAP PENTAX (MISCELLANEOUS) ×3 IMPLANT
TUBING FILTER THERMOFLATOR (ELECTROSURGICAL) ×3 IMPLANT

## 2013-06-08 NOTE — H&P (Signed)
Chief Complaint: Morbid obesity and prior large ventral hernia repair in [place] with recurrence of upper abdominal hernia and sebaceous cyst of the mid chest History of Present Illness: Kimberly Patton is an 57 y.o. female whose abdominal wall problems began back in the 90s when has a 7th grader, she had a large ovarian cyst removed through a large midline incision. At that time they also removed her appendix. She struggled with her weight but it really came on after her children were born. She had 2 C-sections and ended up having a large complex hernia that was repaired by Dr. Tawanna Cooler Heniford in [place] (while they lived in [place]). This involved a large piece of mesh to the lower half of her abdomen. She developed an upper abdominal hernia (trocar site)  and Dr. Marylene Land saw her and said that he would not be inclined operate on her because of her weight. He recommended that she have bariatric surgery and he referred her to me. She has actually recently seen [name] here in the office about a sebaceous cyst and offered transfer care but she wants to be seen in care for by me at this time.  She's tried numerous diets and exercise and always seems to regain the weight and then some. 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 She wants to do this to improve her quality of life. She has been worked up and approved for Sleeve gastrectomy.  UGI normal. CXR is normal. She is ready for surgery. She has lost Weight and along the way is having some increased pain in her hernia. Wants me to look at it but I indicated that I would not put mesh in area after gastrectomy.  Past Medical History   Diagnosis  Date   .  ASTHMA, PERSISTENT, MODERATE  04/03/2007   .  ALLERGIC RHINITIS  04/03/2007   .  Hypertension    .  Morbid obesity    .  Paroxysmal SVT (supraventricular tachycardia)      a. 11/2011 48h Holter: RSR, rare PVC's, occas PAC's   .  Dyspnea on exertion      a. 11/2007 Echo: EF 60%.     Past Surgical History   Procedure  Laterality  Date   .  Cholecystectomy   1995   .  Appendectomy   1996   .  Ovarian cyst removal   1970   .  Ventral hernia repair   2000   .  Foot surgery   1995 / 1996     x2   .  Pilonidal cyst excision   1975   .  Skin cancer excision     .  Tonsillectomy     .  Cesarean section       x2   .  US echocardiography   12/2011     WNL - EF 55-60%, mild MR, grade 1 diastolic dysnfiction (mild)    Current Outpatient Prescriptions   Medication  Sig  Dispense  Refill   .  albuterol (PROVENTIL HFA;VENTOLIN HFA) 108 (90 BASE) MCG/ACT inhaler  Inhale 2 puffs into the lungs every 6 (six) hours as needed. For wheezing     .  CALCIUM PO  Take by mouth daily.     .  Cholecalciferol (VITAMIN D-3 PO)  Take by mouth.     .  diltiazem (CARDIZEM CD) 300 MG 24 hr capsule  TAKE 1 CAPSULE (300 MG TOTAL) BY MOUTH DAILY.  30 capsule  0   .  hydrochlorothiazide (HYDRODIURIL) 25 MG tablet  Take 25 mg by mouth daily.     .  lansoprazole (PREVACID) 30 MG capsule  Take 30 mg by mouth daily.     .  mometasone-formoterol (DULERA) 100-5 MCG/ACT AERO  Inhale 2 puffs into the lungs 2 (two) times daily.  1 Inhaler  6    No current facility-administered medications for this visit.   Review of patient's allergies indicates no known allergies.  Family History   Problem  Relation  Age of Onset   .  Diabetes     .  Hypertension     .  Stroke     .  Hemophilia     .  Breast cancer  Mother    .  Diabetes  Mother    .  Hypertension  Mother    .  Diabetes  Father    .  Hypertension  Father    .  Kidney failure  Mother    Social History: reports that she has never smoked. She has never used smokeless tobacco. She reports that she does not drink alcohol or use illicit drugs.  REVIEW OF SYSTEMS - PERTINENT POSITIVES ONLY:  Negative for DVT. No obstructive sleep apnea. Otherwise negative  Physical Exam:  Blood pressure 148/98, pulse 72, temperature 97.6 F (36.4 C), temperature  source Temporal, resp. rate 15, height 5\' 6"  (1.676 m), weight 342 lb 12.8 oz (155.493 kg).  Body mass index is 55.36 kg/(m^2).  Gen: WDWN WF NAD  Neurological: Alert and oriented to person, place, and time. Motor and sensory function is grossly intact  Head: Normocephalic and atraumatic.  Eyes: Conjunctivae are normal. Pupils are equal, round, and reactive to light. No scleral icterus.  Neck: Normal range of motion. Neck supple. No tracheal deviation or thyromegaly present.  Cardiovascular: SR without murmurs or gallops. No carotid bruits  Respiratory: Effort normal. No respiratory distress. No chest wall tenderness. Breath sounds normal. No wheezes, rales or rhonchi.  Abdomen: Obese with upper abdominal hernia-large. Having some pain in hernia.  GU:  Musculoskeletal: Normal range of motion. Extremities are nontender. No cyanosis, edema or clubbing noted Lymphadenopathy: No cervical, preauricular, postauricular or axillary adenopathy is present Skin: Skin is warm and dry. No rash noted. No diaphoresis. No erythema. No pallor. Pscyh: Normal mood and affect. Behavior is normal. Judgment and thought content normal.  LABORATORY RESULTS:  No results found for this or any previous visit (from the past 48 hour(s)).  RADIOLOGY RESULTS:  No results found.  Problem List:  Patient Active Problem List    Diagnosis  Date Noted   .  Obesity, morbid, BMI 50 or higher  12/08/2012   .  Edema of both legs  06/13/2012   .  Post-menopausal bleeding  04/07/2012   .  Midsternal chest pain  12/10/2011   .  Dyspnea  12/07/2011   .  Splenomegaly  12/07/2011   .  HTN (hypertension)  10/11/2011   .  Varicose veins of lower extremities with other complications  06/18/2011   .  CONTACT DERMATITIS&OTHER ECZEMA DUE TO PLANTS  10/19/2009   .  HERNIA  10/11/2009   .  SUPRAVENTRICULAR TACHYCARDIA  12/10/2007   .  FIBROIDS, UTERUS  04/03/2007   .  ALLERGIC RHINITIS  04/03/2007   .  Asthma, intrinsic  04/03/2007    Assessment & Plan:  Morbid obesity and plan for a sleeve gastrectomy on Dec 29th.  Will also remove sebaceous cyst from chest.  She  is aware of the risks and benefits and these have been discussed with her again this morning.  Orders in EPIC  Matt B. [name], MD, [facility]  [phone] beeper  [phone]

## 2013-06-08 NOTE — Op Note (Signed)
Surgeon: Wenda Low, MD, FACS  Asst:  Jaclynn Guarneri M.D. FACS  Anes:  Gen.  Procedure: Laparoscopic enteral lysis and primary repair of upper abdominal trocar hernia, laparoscopic sleeve gastrectomy, upper endoscopy per Dr. Johna Sheriff, excision of sebaceous cyst of chest  Diagnosis: Morbid obesity and prior complex abdominal hernia repair done in [place]. Recurrent sebaceous cyst midline chest.  Complications: none  EBL:   5 cc  Description of Procedure:  The patient was taken to room 11 and given general anesthesia. The abdomen was prepped with PCMX and draped sterilely. A timeout was performed. Access to the abdomen was achieved through the left upper quadrant it with a 5 mm Optiview technique without difficulty. Where the patient had a previous abdominal wall hernia by Dr. Adline Peals in [place] there were massive adhesions in the lower abdomen. There was an incarcerated hernia in the upper midline from a previous trocar sites. I was able to get that reduced carefully pulling from a LeBus lateral trocar site and omentum and some bowel were run retrieved. This left about a 2 cm defect.  I subsequently used that defect for my extraction site and in the meantime placed a 12 mm trocar through it.  I began by decompressing the stomach with the Veazey G-tube 36. I measured 5-6 cm from the pylorus and began dissecting into the retrogastric space there. I took down all the short gastrics and all way up to the left crus with the harmonic scalpel. The Veazey G-tube was then passed into the antrum and placed on suction. The stomach was held out laterally. I placed another 12 mm lower and lateral to the mesh on the right side to give me a better angle of attack when beginning the first 2 firings. The echelon stapler using 2 green loads were applied first and then I continued the sleeve using blue loads of the echelon stapler. The stomach was then completely transected. Staple line was inspected and  inflated the tube and no bubbles were seen. Dr. Johna Sheriff been endoscoped the patient and the staple lines were not bleeding appeared to be intact. Tisseel was applied along the staple lines. The specimen was extracted through the hernia and then the hernia was closed with about 5 #1 Novafils interrupted transversely. I inspected the closure from within after this was completed and pulled out little bit of adhesions it was stuck up in the air and then appear to be an intact repair. I felt this was better than to try to put mesh and since we did staple across the stomach and there was potential for contamination. Exparel was infiltrated in all the incisions. They were then closed after decompression with 4-0 Vicryl and Dermabond. The patient's mid sternal this sebaceous cyst was then excised a longitudinal incision and this was closed with 3-0 Vicryl and Steri-Strips. Patient are the procedure well was taken to the recovery room in stable condition.    Matt B. [name], MD, [facility], [place] [phone]

## 2013-06-08 NOTE — Op Note (Signed)
Procedure: Upper GI endoscopy  Description of procedure: Upper GI endoscopy is performed at the completion of laparoscopic sleeve gastrectomy by Dr.  [name]  The video endoscope was introduced into the upper esophagus and then passed to the EG junction at about 40 cm. The esophagus appeared normal. The gastric sleeve was entered. The sleeve was tensely distended with air while the outlet was obstructed under saline irrigation by the operating surgeon. There was no evidence of leak. The staple line was intact and without bleeding. The scope was advanced to the antrum and pylorus visualized. There was no stricture or twisting or mucosal abnormality, and particularly no narrowing noted at the incisura.  The pouch was then desufflated and the scope withdrawn.  Mariella Saa MD, FACS  06/08/2013, 1:11 PM

## 2013-06-08 NOTE — Transfer of Care (Signed)
Immediate Anesthesia Transfer of Care Note  Patient: Kimberly Patton  Procedure(s) Performed: Procedure(s): LAPAROSCOPIC GASTRIC SLEEVE RESECTION AND EXCISION OF SEBACEUS CYST FROM MID CHEST takedown of incarcerated ventral hernia and primary repair endoscopy (N/A) UPPER GI ENDOSCOPY  Patient Location: PACU  Anesthesia Type:General  Level of Consciousness: awake, alert , oriented and sedated  Airway & Oxygen Therapy: Patient Spontanous Breathing  Post-op Assessment: Report given to PACU RN and Post -op Vital signs reviewed and stable  Post vital signs: stable  Complications: No apparent anesthesia complications

## 2013-06-08 NOTE — Anesthesia Preprocedure Evaluation (Signed)
Anesthesia Evaluation    Airway Mallampati: II TM Distance: >3 FB Neck ROM: Full    Dental  (+) Teeth Intact and Dental Advisory Given   Pulmonary shortness of breath and with exertion, asthma ,  breath sounds clear to auscultation  Pulmonary exam normal       Cardiovascular hypertension, + Peripheral Vascular Disease + dysrhythmias Supra Ventricular Tachycardia Rhythm:Regular Rate:Normal     Neuro/Psych    GI/Hepatic GERD-  Medicated,  Endo/Other  Morbid obesity  Renal/GU      Musculoskeletal   Abdominal (+) + obese,   Peds  Hematology  (+) Blood dyscrasia, ,   Anesthesia Other Findings   Reproductive/Obstetrics                           Anesthesia Physical Anesthesia Plan  ASA: III  Anesthesia Plan: General   Post-op Pain Management:    Induction: Intravenous  Airway Management Planned: Oral ETT  Additional Equipment:   Intra-op Plan:   Post-operative Plan: Extubation in OR  Informed Consent: I have reviewed the patients History and Physical, chart, labs and discussed the procedure including the risks, benefits and alternatives for the proposed anesthesia with the patient or authorized representative who has indicated his/her understanding and acceptance.   Dental advisory given  Plan Discussed with: CRNA  Anesthesia Plan Comments:         Anesthesia Quick Evaluation

## 2013-06-08 NOTE — Anesthesia Postprocedure Evaluation (Signed)
Anesthesia Post Note  Patient: Kimberly Patton  Procedure(s) Performed: Procedure(s) (LRB): LAPAROSCOPIC GASTRIC SLEEVE RESECTION AND EXCISION OF SEBACEUS CYST FROM MID CHEST takedown of incarcerated ventral hernia and primary repair endoscopy (N/A) UPPER GI ENDOSCOPY  Anesthesia type: General  Patient location: PACU  Post pain: Pain level controlled  Post assessment: Post-op Vital signs reviewed  Last Vitals:  Filed Vitals:   06/08/13 1015  BP: 138/77  Pulse:   Temp: 36.4 C  Resp:     Post vital signs: Reviewed  Level of consciousness: sedated  Complications: No apparent anesthesia complications

## 2013-06-08 NOTE — Interval H&P Note (Signed)
History and Physical Interval Note:  06/08/2013 7:09 AM  Kimberly Patton  has presented today for surgery, with the diagnosis of morbid obesity   The various methods of treatment have been discussed with the patient and family. After consideration of risks, benefits and other options for treatment, the patient has consented to  Procedure(s): LAPAROSCOPIC GASTRIC SLEEVE RESECTION (N/A) and removal of sebaceous cyst as a surgical intervention .  The patient's history has been reviewed, patient examined, no change in status, stable for surgery.  I have reviewed the patient's chart and labs.  Questions were answered to the patient's satisfaction.     Lamona Eimer B

## 2013-06-08 NOTE — Telephone Encounter (Signed)
Pt requesting medication refill. Not on current medication list;past only. Last ov 05/2013. pls advise

## 2013-06-09 ENCOUNTER — Inpatient Hospital Stay (HOSPITAL_COMMUNITY): Payer: BC Managed Care – PPO

## 2013-06-09 ENCOUNTER — Encounter (HOSPITAL_COMMUNITY): Payer: Self-pay | Admitting: Surgery

## 2013-06-09 LAB — CBC WITH DIFFERENTIAL/PLATELET
Basophils Absolute: 0 10*3/uL (ref 0.0–0.1)
Basophils Relative: 0 % (ref 0–1)
Eosinophils Absolute: 0 10*3/uL (ref 0.0–0.7)
Eosinophils Relative: 0 % (ref 0–5)
HCT: 34.8 % — ABNORMAL LOW (ref 36.0–46.0)
Hemoglobin: 11.4 g/dL — ABNORMAL LOW (ref 12.0–15.0)
Lymphocytes Relative: 11 % — ABNORMAL LOW (ref 12–46)
Lymphs Abs: 0.9 10*3/uL (ref 0.7–4.0)
MCH: 26.6 pg (ref 26.0–34.0)
MCHC: 32.8 g/dL (ref 30.0–36.0)
MCV: 81.1 fL (ref 78.0–100.0)
Monocytes Absolute: 0.7 10*3/uL (ref 0.1–1.0)
Monocytes Relative: 8 % (ref 3–12)
Neutro Abs: 6.9 10*3/uL (ref 1.7–7.7)
Neutrophils Relative %: 82 % — ABNORMAL HIGH (ref 43–77)
Platelets: 333 10*3/uL (ref 150–400)
RBC: 4.29 MIL/uL (ref 3.87–5.11)
RDW: 14.5 % (ref 11.5–15.5)
WBC: 8.4 10*3/uL (ref 4.0–10.5)

## 2013-06-09 LAB — HEMOGLOBIN AND HEMATOCRIT, BLOOD
HCT: 35.5 % — ABNORMAL LOW (ref 36.0–46.0)
Hemoglobin: 11.5 g/dL — ABNORMAL LOW (ref 12.0–15.0)

## 2013-06-09 MED ORDER — IOHEXOL 300 MG/ML  SOLN
50.0000 mL | Freq: Once | INTRAMUSCULAR | Status: AC | PRN
  Administered 2013-06-09: 50 mL via ORAL

## 2013-06-09 NOTE — Care Management Note (Signed)
    Page 1 of 1   06/09/2013     10:36:19 AM   CARE MANAGEMENT NOTE 06/09/2013  Patient:  Kimberly Patton, Kimberly Patton   Account Number:  [id number]  Date Initiated:  06/09/2013  Documentation initiated by:  Lorenda Ishihara  Subjective/Objective Assessment:   57 yo female admitted s/p Laparoscopic enteral lysis, repair of upper abdominal trocar hernia, laparoscopic sleeve gastrectomy. PTA lived at home with spouse.     Action/Plan:   Home when stable   Anticipated DC Date:  06/11/2013   Anticipated DC Plan:  HOME/SELF CARE      DC Planning Services  CM consult      Choice offered to / List presented to:             Status of service:  Completed, signed off Medicare Important Message given?   (If response is "NO", the following Medicare IM given date fields will be blank) Date Medicare IM given:   Date Additional Medicare IM given:    Discharge Disposition:  HOME/SELF CARE  Per UR Regulation:  Reviewed for med. necessity/level of care/duration of stay  If discussed at Long Length of Stay Meetings, dates discussed:    Comments:

## 2013-06-09 NOTE — Progress Notes (Signed)
Patient ID: Kimberly Patton, female   DOB: 03-10-1956, 57 y.o.   MRN: [id number] [facility] Progress Note:   1 Day Post-Op  Subjective: Mental status is clear.  Sore at ventral hernia site and will get abdominal binder Objective: Vital signs in last 24 hours: Temp:  [97.9 F (36.6 C)-98.2 F (36.8 C)] 98 F (36.7 C) (12/30 1350) Pulse Rate:  [57-95] 57 (12/30 1350) Resp:  [16] 16 (12/30 1350) BP: (128-148)/(72-97) 128/72 mmHg (12/30 1350) SpO2:  [94 %-100 %] 98 % (12/30 1350)  Intake/Output from previous day: 12/29 0701 - 12/30 0700 In: 4011.7 [I.V.:4011.7] Out: 3370 [Urine:3245; Blood:50] Intake/Output this shift: Total I/O In: -  Out: 200 [Urine:200]  Physical Exam: Work of breathing is normal.  Swallow OK.    Lab Results:  Results for orders placed during the hospital encounter of 06/08/13 (from the past 48 hour(s))  CBC     Status: Abnormal   Collection Time    06/08/13 12:00 PM      Result Value Range   WBC 15.5 (*) 4.0 - 10.5 K/uL   RBC 4.88  3.87 - 5.11 MIL/uL   Hemoglobin 13.1  12.0 - 15.0 g/dL   HCT 01.0  27.2 - 53.6 %   MCV 81.1  78.0 - 100.0 fL   MCH 26.8  26.0 - 34.0 pg   MCHC 33.1  30.0 - 36.0 g/dL   RDW 64.4  03.4 - 74.2 %   Platelets 277  150 - 400 K/uL  CREATININE, SERUM     Status: None   Collection Time    06/08/13 12:00 PM      Result Value Range   Creatinine, Ser 0.61  0.50 - 1.10 mg/dL   GFR calc non Af Amer >90  >90 mL/min   GFR calc Af Amer >90  >90 mL/min   Comment: (NOTE)     The eGFR has been calculated using the CKD EPI equation.     This calculation has not been validated in all clinical situations.     eGFR's persistently <90 mL/min signify possible Chronic Kidney     Disease.  HEMOGLOBIN AND HEMATOCRIT, BLOOD     Status: None   Collection Time    06/08/13  3:50 PM      Result Value Range   Hemoglobin 12.7  12.0 - 15.0 g/dL   HCT 59.5  63.8 - 75.6 %  CBC WITH DIFFERENTIAL     Status: Abnormal   Collection Time   06/09/13  5:20 AM      Result Value Range   WBC 8.4  4.0 - 10.5 K/uL   RBC 4.29  3.87 - 5.11 MIL/uL   Hemoglobin 11.4 (*) 12.0 - 15.0 g/dL   HCT 43.3 (*) 29.5 - 18.8 %   MCV 81.1  78.0 - 100.0 fL   MCH 26.6  26.0 - 34.0 pg   MCHC 32.8  30.0 - 36.0 g/dL   RDW 41.6  60.6 - 30.1 %   Platelets 333  150 - 400 K/uL   Neutrophils Relative % 82 (*) 43 - 77 %   Neutro Abs 6.9  1.7 - 7.7 K/uL   Lymphocytes Relative 11 (*) 12 - 46 %   Lymphs Abs 0.9  0.7 - 4.0 K/uL   Monocytes Relative 8  3 - 12 %   Monocytes Absolute 0.7  0.1 - 1.0 K/uL   Eosinophils Relative 0  0 - 5 %   Eosinophils Absolute 0.0  0.0 - 0.7 K/uL   Basophils Relative 0  0 - 1 %   Basophils Absolute 0.0  0.0 - 0.1 K/uL    Radiology/Results: Dg Ugi W/water Sol Cm  06/09/2013   CLINICAL DATA:  One day post gastric sleeve procedure.  EXAM: WATER SOLUBLE UPPER GI SERIES  TECHNIQUE: Single-column upper GI series was performed using water soluble contrast.  CONTRAST:  50mL OMNIPAQUE IOHEXOL 300 MG/ML  SOLN  COMPARISON:  03/20/2013.  FLUOROSCOPY TIME:  1 min and 1 second.  FINDINGS: The patient ingested 50 cc of Omnipaque 300. This traversed through the gastric sleeve without difficulty. Mild irregularity at the level of the operative site suggestive of postoperative edema. No leak identified.  IMPRESSION: Contrast traversed through the gastric sleeve without difficulty.  Mild irregularity at the level of the operative site suggestive of postoperative edema.  No leak identified.  These results were called by telephone at the time of interpretation on 06/09/2013 at 10:17 AM to Dr. Luretha Murphy , who verbally acknowledged these results.   Electronically Signed   By: Bridgett Larsson M.D.   On: 06/09/2013 10:17    Anti-infectives: Anti-infectives   Start     Dose/Rate Route Frequency Ordered Stop   06/08/13 0533  cefOXitin (MEFOXIN) 2 g in dextrose 5 % 50 mL IVPB     2 g 100 mL/hr over 30 Minutes Intravenous On call to O.R. 06/08/13 0533  06/08/13 0705      Assessment/Plan: Problem List: Patient Active Problem List   Diagnosis Date Noted  . Status post laparoscopic sleeve gastrectomy 06/08/2013  . Right flank pain 04/21/2013  . Obesity, morbid, BMI 50 or higher 12/08/2012  . Edema of both legs 06/13/2012  . Post-menopausal bleeding 04/07/2012  . Midsternal chest pain 12/10/2011  . Dyspnea 12/07/2011  . Splenomegaly 12/07/2011  . HTN (hypertension) 10/11/2011  . Varicose veins of lower extremities with other complications 06/18/2011  . CONTACT DERMATITIS&OTHER ECZEMA DUE TO PLANTS 10/19/2009  . HERNIA 10/11/2009  . SUPRAVENTRICULAR TACHYCARDIA 12/10/2007  . FIBROIDS, UTERUS 04/03/2007  . ALLERGIC RHINITIS 04/03/2007  . Asthma, intrinsic 04/03/2007    Begin PD 1 sleeve diet.   1 Day Post-Op    LOS: 1 day   Matt B. [name], MD, [facility] [phone] beeper [phone]  06/09/2013 1:57 PM

## 2013-06-10 LAB — CBC WITH DIFFERENTIAL/PLATELET
Basophils Absolute: 0 10*3/uL (ref 0.0–0.1)
Basophils Relative: 0 % (ref 0–1)
Eosinophils Absolute: 0.1 10*3/uL (ref 0.0–0.7)
Eosinophils Relative: 2 % (ref 0–5)
HCT: 35.1 % — ABNORMAL LOW (ref 36.0–46.0)
Hemoglobin: 11.1 g/dL — ABNORMAL LOW (ref 12.0–15.0)
Lymphocytes Relative: 35 % (ref 12–46)
Lymphs Abs: 2.4 10*3/uL (ref 0.7–4.0)
MCH: 26.4 pg (ref 26.0–34.0)
MCHC: 31.6 g/dL (ref 30.0–36.0)
MCV: 83.6 fL (ref 78.0–100.0)
Monocytes Absolute: 0.6 10*3/uL (ref 0.1–1.0)
Monocytes Relative: 9 % (ref 3–12)
Neutro Abs: 3.7 10*3/uL (ref 1.7–7.7)
Neutrophils Relative %: 55 % (ref 43–77)
Platelets: 281 10*3/uL (ref 150–400)
RBC: 4.2 MIL/uL (ref 3.87–5.11)
RDW: 15 % (ref 11.5–15.5)
WBC: 6.8 10*3/uL (ref 4.0–10.5)

## 2013-06-10 MED ORDER — HYDROCODONE-ACETAMINOPHEN 7.5-325 MG/15ML PO SOLN: 15.0000 mL | Freq: Four times a day (QID) | ORAL | Status: DC | PRN

## 2013-06-10 NOTE — Discharge Summary (Signed)
Physician Discharge Summary  Patient ID: LAYANI FORONDA MRN: [id number] DOB/AGE: 57-Oct-1957 57 y.o.  Admit date: 06/08/2013 Discharge date: 06/10/2013  Admission Diagnoses:  Morbid obesity   Discharge Diagnoses:  same  Active Problems:   Status post laparoscopic sleeve gastrectomy   Surgery:  Laparoscopic sleeve gastrectomy  Discharged Condition: improved  Hospital Course:   Had surgery.  UGI OK.  Taking liquids and pain controlled.  Ready for discharge   Consults: none  Significant Diagnostic Studies: ugi    Discharge Exam: Blood pressure 145/79, pulse 70, temperature 97.6 F (36.4 C), temperature source Oral, resp. rate 18, height 5\' 8"  (1.727 m), weight 314 lb 2.5 oz (142.5 kg), SpO2 95.00%. Abdominal binder in place.  Incisions OK  Disposition: 01-Home or Self Care  Discharge Orders   Future Appointments Provider Department Dept Phone   06/23/2013 3:30 PM [facility] Post-Op Class [facility] [phone]   06/24/2013 4:50 PM Valarie Merino, MD [facility], [place] [phone]   Future Orders Complete By Expires   Discharge instructions  As directed    Comments:     Follow diet as previously instructed   Increase activity slowly  As directed    No wound care  As directed        Medication List    STOP taking these medications       HYDROcodone-acetaminophen 5-325 MG per tablet  Commonly known as:  NORCO/VICODIN  Replaced by:  HYDROcodone-acetaminophen 7.5-325 mg/15 ml solution      TAKE these medications       albuterol 108 (90 BASE) MCG/ACT inhaler  Commonly known as:  PROVENTIL HFA;VENTOLIN HFA  Inhale 2 puffs into the lungs every 6 (six) hours as needed. For wheezing     CRANBERRY PO  Take 2 tablets by mouth daily.     diltiazem 300 MG 24 hr capsule  Commonly known as:  TIAZAC  Take 300 mg by mouth daily after lunch.     hydrochlorothiazide 25 MG tablet  Commonly known as:  HYDRODIURIL  TAKE  1 TABLET BY MOUTH EVERY DAY     HYDROcodone-acetaminophen 7.5-325 mg/15 ml solution  Commonly known as:  HYCET  Take 15 mLs by mouth 4 (four) times daily as needed for moderate pain.     lansoprazole 30 MG capsule  Commonly known as:  PREVACID  Take 30 mg by mouth daily as needed (for reflux).     naproxen sodium 220 MG tablet  Commonly known as:  ANAPROX  Take 220 mg by mouth daily as needed (pain).     OSTEO BI-FLEX JOINT SHIELD PO  Take 2 tablets by mouth daily.         SignedValarie Merino 06/10/2013, 8:29 AM

## 2013-06-10 NOTE — Progress Notes (Signed)
Patient alert and oriented, pain is controlled. Patient is tolerating fluids, plan to advance to protein shake today.  Reviewed Gastric sleeve discharge instructions with patient and patient is able to articulate understanding.  Provided information on BELT program, Support Group and [facility] outpatient pharmacy. All questions answered, will continue to monitor.  

## 2013-06-10 NOTE — Plan of Care (Signed)
Problem: Phase II Progression Outcomes Goal: Tolerating diet advance to Outcome: Completed/Met Date Met:  06/10/13 Pt tolerating 2 oz of chocolate unjury w/o complications.

## 2013-06-11 HISTORY — PX: OTHER SURGICAL HISTORY: SHX169

## 2013-06-23 ENCOUNTER — Encounter: Payer: BC Managed Care – PPO | Attending: Surgery

## 2013-06-23 DIAGNOSIS — E669 Obesity, unspecified: Secondary | ICD-10-CM | POA: Insufficient documentation

## 2013-06-23 DIAGNOSIS — Z713 Dietary counseling and surveillance: Secondary | ICD-10-CM | POA: Insufficient documentation

## 2013-06-23 NOTE — Progress Notes (Signed)
Bariatric Class:  Appt start time: 1530 end time:  1630.  2 Week Post-Operative Nutrition Class  Patient was seen on 06/23/2013 for Post-Operative Nutrition education at [facility].   Surgery date: 06/08/13 Surgery type: Sleeve Starting weight at [facility]: 328 lbs on 03/14/13  Weight today: 300.5 lbs Weight change: 27.5 lbs Total weight lost: 27.5 lbs  TANITA  BODY COMP RESULTS  06/23/13   BMI (kg/m^2) 48.5   Fat Mass (lbs) 167.0   Fat Free Mass (lbs) 133.5   Total Body Water (lbs) 97.5   The following the learning objectives were met by the patient during this course:  Identifies Phase 3A (Soft, High Proteins) Dietary Goals and will begin from 2 weeks post-operatively to 2 months post-operatively  Identifies appropriate sources of fluids and proteins   States protein recommendations and appropriate sources post-operatively  Identifies the need for appropriate texture modifications, mastication, and bite sizes when consuming solids  Identifies appropriate multivitamin and calcium sources post-operatively  Describes the need for physical activity post-operatively and will follow MD recommendations  States when to call healthcare provider regarding medication questions or post-operative complications  Handouts given during class include:  Phase 3A: Soft, High Protein Diet Handout  Follow-Up Plan: Patient will follow-up at [facility] in 6 weeks for 8 week post-op nutrition visit for diet advancement per MD.

## 2013-06-24 ENCOUNTER — Ambulatory Visit (INDEPENDENT_AMBULATORY_CARE_PROVIDER_SITE_OTHER): Payer: BC Managed Care – PPO | Admitting: Surgery

## 2013-06-24 ENCOUNTER — Encounter (INDEPENDENT_AMBULATORY_CARE_PROVIDER_SITE_OTHER): Payer: Self-pay | Admitting: Surgery

## 2013-06-24 VITALS — BP 128/71 | HR 72 | Temp 98.1°F | Resp 18 | Ht 66.0 in | Wt 298.6 lb

## 2013-06-24 DIAGNOSIS — Z9884 Bariatric surgery status: Secondary | ICD-10-CM

## 2013-06-24 NOTE — Progress Notes (Signed)
Kimberly Patton 58 y.o.  Body mass index is 48.22 kg/(m^2).  Patient Active Problem List   Diagnosis Date Noted  . Status post laparoscopic sleeve gastrectomy 06/08/2013  . Right flank pain 04/21/2013  . Obesity, morbid, BMI 50 or higher 12/08/2012  . Edema of both legs 06/13/2012  . Post-menopausal bleeding 04/07/2012  . Midsternal chest pain 12/10/2011  . Dyspnea 12/07/2011  . Splenomegaly 12/07/2011  . HTN (hypertension) 10/11/2011  . Varicose veins of lower extremities with other complications 19/50/9326  . CONTACT DERMATITIS&OTHER ECZEMA DUE TO PLANTS 10/19/2009  . HERNIA 10/11/2009  . SUPRAVENTRICULAR TACHYCARDIA 12/10/2007  . FIBROIDS, UTERUS 04/03/2007  . ALLERGIC RHINITIS 04/03/2007  . Asthma, intrinsic 04/03/2007    No Known Allergies  Past Surgical History  Procedure Laterality Date  . Cholecystectomy  1995  . Appendectomy  1996  . Ovarian cyst removal  1970  . Ventral hernia repair  2000  . Foot surgery  1995 / 1996    x2  . Pilonidal cyst excision  1975  . Skin cancer excision    . Tonsillectomy    . Cesarean section      x2  . US echocardiography  12/2011    WNL - EF 55-60%, mild MR, grade 1 diastolic dysnfiction (mild)  . Laparoscopic gastric sleeve resection N/A 06/08/2013    Procedure: LAPAROSCOPIC GASTRIC SLEEVE RESECTION AND EXCISION OF SEBACEUS CYST FROM MID CHEST takedown of incarcerated ventral hernia and primary repair endoscopy;  Surgeon: [name], MD;  Location: [facility];  Service: General;  Laterality: N/A;  . Upper gi endoscopy  06/08/2013    Procedure: UPPER GI ENDOSCOPY;  Surgeon: [name], MD;  Location: [facility];  Service: General;;   [name], MD No diagnosis found.  First postop sleeve and hernia repair.  Incision look ok.  Has lost 20 lbs since sleeve 06/08/13. Will recheck in 2 months.  [name] Done, MD, [facility] [phone] beeper [phone]  06/24/2013 5:56 PM  .

## 2013-06-24 NOTE — Patient Instructions (Signed)
Sleeve Gastrectomy A sleeve gastrectomy is a surgery in which a large portion of the stomach is removed. After the surgery, the stomach will be a narrow tube about the size of a banana. This surgery is performed to help a person lose weight. The person loses weight because the reduced size of the stomach restricts the amount of food that the person can eat. The stomach will hold much less food than before the surgery. Also, the part of the stomach that is removed produces a hormone that causes hunger.  This surgery is done for people who have morbid obesity, defined as a body mass index (BMI) greater than 40. BMI is an estimate of body fat and is calculated from the height and weight of a person. This surgery may also be done for people with a BMI between 35 and 40 if they have other diseases, such as type 2 diabetes mellitus, obstructive sleep apnea, or heart and lung disorders (cardiopulmonary diseases).  LET YOUR HEALTH CARE PROVIDER KNOW ABOUT:  Any allergies you have.   All medicines you are taking, including vitamins, herbs, eyedrops, creams, and over-the-counter medicines.   Use of steroids (by mouth or creams).   Previous problems you or members of your family have had with the use of anesthetics.   Any blood disorders you have.   Previous surgeries you have had.   Possibility of pregnancy, if this applies.   Other health problems you have. RISKS AND COMPLICATIONS Generally, sleeve gastrectomy is a safe procedure. However, as with any procedure, complications can occur. Possible complications include:  Infection.  Bleeding.  Blood clots.  Damage to other organs or tissue.  Leakage of fluid from the stomach into the abdominal cavity (rare). BEFORE THE PROCEDURE  You may need to have blood tests and imaging tests (such as X-rays or ultrasonography) done before the day of surgery. A test to evaluate your esophagus and how it moves (esophageal manometry) may also be  done.  You may be placed on a liquid diet 2 3 weeks before the surgery.  Ask your health care provider about changing or stopping your regular medicines.  Do not eat or drink anything for at least 8 hours before the procedure.   Make plans to have someone drive you home after your hospital stay. Also arrange for someone to help you with activities during recovery. PROCEDURE  A laparoscopic technique is usually used for this surgery:  You will be given medicine to make you sleep through the procedure (general anesthetic). This medicine will be given through an intravenous (IV) access tube that is put into one of your veins.  Once you are asleep, your abdomen will be cleaned and sterilized.  Several small incisions will be made in your abdomen.  Your abdomen will be filled with air so that it expands. This gives the surgeon more room to operate and makes your organs easier to see.  A thin, lighted tube with a tiny camera on the end (laparoscope) is put through a small incision in your abdomen. The camera on the laparoscope sends a picture to a TV screen in the operating room. This gives the surgeon a good view inside the abdomen.  Hollow tubes are put through the other small incisions in your abdomen. The tools needed for the procedure are put through these tubes.  The surgeon uses staples to divide part of the stomach and then removes it through one of the incisions.  The remaining stomach may be reinforced using   stitches or surgical glue or both to prevent leakage of the stomach contents. A small tube (drain) may be placed through one of the incisions to allow extra fluid to flow from the area.  The incisions are closed with stitches, staples, or glue. AFTER THE PROCEDURE  You will be monitored closely in a recovery area. Once the anesthetic has worn off, you will likely be moved to a regular hospital room.  You will be given medicine for pain and nausea.   You may have a drain  from one of the incisions in your abdomen. If a drain is used, it may stay in place after you go home from the hospital and be removed at a follow-up appointment.   You will be encouraged to walk around several times a day. This helps prevent blood clots.  You will be started on a liquid diet the first day after your surgery. Sometimes a test is done to check for leaking before you can eat.  You will be urged to cough and do deep breathing exercises. This helps prevent a lung infection after a surgery.  You will likely need to stay in the hospital for a few days.  Document Released: 03/25/2009 Document Revised: 01/28/2013 Document Reviewed: 10/10/2012 Exit[employer] Patient Information 2014 [employer].  

## 2013-06-25 ENCOUNTER — Ambulatory Visit (INDEPENDENT_AMBULATORY_CARE_PROVIDER_SITE_OTHER): Payer: Self-pay | Admitting: Surgery

## 2013-06-30 ENCOUNTER — Telehealth (INDEPENDENT_AMBULATORY_CARE_PROVIDER_SITE_OTHER): Payer: Self-pay | Admitting: General Surgery

## 2013-06-30 NOTE — Telephone Encounter (Signed)
Pt called for advice on constipation.  Prior to gastric sleeve surgery, she had daily BMs.  Now that she has begun soft-solids, she is having trouble and feels "bound up."  MOM and suppositories help her to go, but she doesn't want to rely on this daily.  Suggested she try Miralax QD, okay to add stool softeners and increase overall fluid intake.  Pt states she is trying to walk more, too.  Pt to call back prn.

## 2013-07-02 ENCOUNTER — Other Ambulatory Visit: Payer: Self-pay | Admitting: Orthopedic Surgery

## 2013-07-02 DIAGNOSIS — M25561 Pain in right knee: Secondary | ICD-10-CM

## 2013-07-06 ENCOUNTER — Other Ambulatory Visit: Payer: Self-pay | Admitting: Family Medicine

## 2013-07-08 ENCOUNTER — Ambulatory Visit (INDEPENDENT_AMBULATORY_CARE_PROVIDER_SITE_OTHER): Payer: BC Managed Care – PPO | Admitting: Surgery

## 2013-07-08 ENCOUNTER — Encounter (INDEPENDENT_AMBULATORY_CARE_PROVIDER_SITE_OTHER): Payer: Self-pay | Admitting: Surgery

## 2013-07-08 ENCOUNTER — Other Ambulatory Visit (INDEPENDENT_AMBULATORY_CARE_PROVIDER_SITE_OTHER): Payer: Self-pay | Admitting: Surgery

## 2013-07-08 VITALS — BP 130/86 | HR 78 | Temp 97.9°F | Resp 16 | Ht 66.0 in | Wt 294.2 lb

## 2013-07-08 DIAGNOSIS — Z9884 Bariatric surgery status: Secondary | ICD-10-CM

## 2013-07-08 MED ORDER — HYDROCODONE-ACETAMINOPHEN 7.5-325 MG/15ML PO SOLN: 15.0000 mL | Freq: Four times a day (QID) | ORAL | Status: DC | PRN

## 2013-07-08 NOTE — Progress Notes (Signed)
Kimberly Patton 58 y.o.  Body mass index is 47.51 kg/(m^2).  Patient Active Problem List   Diagnosis Date Noted  . Status post laparoscopic sleeve gastrectomy 06/08/2013  . Right flank pain 04/21/2013  . Obesity, morbid, BMI 50 or higher 12/08/2012  . Edema of both legs 06/13/2012  . Post-menopausal bleeding 04/07/2012  . Midsternal chest pain 12/10/2011  . Dyspnea 12/07/2011  . Splenomegaly 12/07/2011  . HTN (hypertension) 10/11/2011  . Varicose veins of lower extremities with other complications 97/98/9211  . CONTACT DERMATITIS&OTHER ECZEMA DUE TO PLANTS 10/19/2009  . HERNIA 10/11/2009  . SUPRAVENTRICULAR TACHYCARDIA 12/10/2007  . FIBROIDS, UTERUS 04/03/2007  . ALLERGIC RHINITIS 04/03/2007  . Asthma, intrinsic 04/03/2007    No Known Allergies  Past Surgical History  Procedure Laterality Date  . Cholecystectomy  1995  . Appendectomy  1996  . Ovarian cyst removal  1970  . Ventral hernia repair  2000  . Foot surgery  1995 / 1996    x2  . Pilonidal cyst excision  1975  . Skin cancer excision    . Tonsillectomy    . Cesarean section      x2  . US echocardiography  12/2011    WNL - EF 55-60%, mild MR, grade 1 diastolic dysnfiction (mild)  . Laparoscopic gastric sleeve resection N/A 06/08/2013    Procedure: LAPAROSCOPIC GASTRIC SLEEVE RESECTION AND EXCISION OF SEBACEUS CYST FROM MID CHEST takedown of incarcerated ventral hernia and primary repair endoscopy;  Surgeon: Kimberly Earls, MD;  Location: [facility];  Service: General;  Laterality: N/A;  . Upper gi endoscopy  06/08/2013    Procedure: UPPER GI ENDOSCOPY;  Surgeon: Kimberly Earls, MD;  Location: [facility];  Service: General;;   Kimberly Lofts, MD No diagnosis found.  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 This has abated since then but is just above where a repair her hernia with interrupted sutures of Novafil. It sounds like something is pulled in May of cutter to 1. Beneath  that area she also had a lot of adhesions divided.  I reassured her I gave her prescription for medication for pain. I will see her back in March and follow up. Kimberly Patton Done, MD, [facility] [phone] beeper [phone]  07/08/2013 4:34 PM

## 2013-07-21 ENCOUNTER — Ambulatory Visit: Payer: Self-pay | Admitting: Dietician

## 2013-07-22 ENCOUNTER — Encounter: Payer: BC Managed Care – PPO | Attending: Surgery | Admitting: Dietician

## 2013-07-22 DIAGNOSIS — Z713 Dietary counseling and surveillance: Secondary | ICD-10-CM | POA: Insufficient documentation

## 2013-07-22 DIAGNOSIS — E669 Obesity, unspecified: Secondary | ICD-10-CM | POA: Insufficient documentation

## 2013-07-22 NOTE — Progress Notes (Signed)
  Follow-up visit:  6 Weeks Post-Operative Gastric Sleeve Surgery  Medical Nutrition Therapy:  Appt start time: 5621 end time:  1700.  Post-operative Bariatric Surgery Nutrition Management: Kimberly Patton is here today reporting that she is feeling well and tolerating appropriate foods. She had severe stool impaction several weeks ago and stopped taking Calcium. She reports having 3 teaspoons of unsweetened applesauce every morning to help maintain bowel regularity. She and her husband eat out a lot and the patient states that she has been able to find several high-protein options. She is having knee surgery tomorrow to correct and injury she sustained just before her bariatric surgery. She is not able to do any physical activity at this time but plans to begin water aerobics as tolerated and per orthopedic surgeon.  Surgery date: 06/08/13 Surgery type: Sleeve Starting weight at [facility]: 328 lbs on 03/14/13 Weight today: 282.5 lbs Weight change: 18 lbs Total weight lost: 45.5 lbs  TANITA  BODY COMP RESULTS  06/23/13 07/22/2013   BMI (kg/m^2) 48.5 45.6   Fat Mass (lbs) 167.0 151   Fat Free Mass (lbs) 133.5 131.5   Total Body Water (lbs) 97.5 96.5    Preferred Learning Style:  No preference indicated   Learning Readiness:  Ready  24-hr recall: B (AM): Premiere shake (30g) Snk (AM): sometimes string cheese L (PM): Yogurt (14g) D (PM): Chicken wings; pinto beans and cheese from [employer]; chili from [place]; shrimp; eggs (12-15g) Snk (PM): 2 tsp PB Fit (4g)   Fluid intake: 50-55 oz Estimated total protein intake: >60g  Medications: see list Supplementation: only taking 1 Calcium and no iron due to constipation  Using straws: no Drinking while eating: no Hair loss: no Carbonated beverages: no N/V/D/C: no Dumping syndrome: no  Recent physical activity:  None (having knee surgery tomorrow)  Progress Towards Goal(s):  In progress.  Handouts given during visit include:  Phase 3B high  protein and non-starchy vegetables   Nutritional Diagnosis:  [place]-3.3 Overweight/obesity related to past poor dietary habits and physical inactivity as evidenced by patient w/ recent gastric sleeve surgery following dietary guidelines for continued weight loss.    Intervention:  Nutrition education provided.  Teaching Method Utilized:  Visual Auditory Hands on  Barriers to learning/adherence to lifestyle change: knee injury  Demonstrated degree of understanding via:  Teach Back   Monitoring/Evaluation:  Dietary intake, exercise, and body weight. Follow up in 6 weeks for 3 month post-op visit.

## 2013-07-22 NOTE — Patient Instructions (Signed)
 -  Increase fluid intake to meet 64 oz goal    06/23/13 07/22/2013   BMI (kg/m^2) 48.5 45.6   Fat Mass (lbs) 167.0 151   Fat Free Mass (lbs) 133.5 131.5   Total Body Water (lbs) 97.5 96.5

## 2013-07-25 HISTORY — PX: KNEE ARTHROSCOPY W/ MENISCAL REPAIR: SHX1877

## 2013-07-30 ENCOUNTER — Telehealth (INDEPENDENT_AMBULATORY_CARE_PROVIDER_SITE_OTHER): Payer: Self-pay

## 2013-07-30 NOTE — Telephone Encounter (Signed)
Patient states she saw DR. Hassell Done 07/08/13 for adhesions and Dr. Hassell Done had advised her to take Hydrocodone for pain but she decided not to and the pain finally went away but Now the pain is back and thinks something else needs to be done . Please advise

## 2013-08-28 ENCOUNTER — Ambulatory Visit (INDEPENDENT_AMBULATORY_CARE_PROVIDER_SITE_OTHER): Payer: BC Managed Care – PPO | Admitting: Surgery

## 2013-08-28 ENCOUNTER — Encounter (INDEPENDENT_AMBULATORY_CARE_PROVIDER_SITE_OTHER): Payer: Self-pay | Admitting: Surgery

## 2013-08-28 VITALS — BP 120/76 | HR 72 | Temp 98.2°F | Resp 16 | Ht 66.0 in | Wt 270.4 lb

## 2013-08-28 DIAGNOSIS — Z9884 Bariatric surgery status: Secondary | ICD-10-CM

## 2013-08-28 NOTE — Progress Notes (Signed)
Kimberly Patton 58 y.o.  Body mass index is 43.66 kg/(m^2).  Patient Active Problem List   Diagnosis Date Noted  . Status post laparoscopic sleeve gastrectomy 06/08/2013  . Right flank pain 04/21/2013  . Obesity, morbid, BMI 50 or higher 12/08/2012  . Edema of both legs 06/13/2012  . Post-menopausal bleeding 04/07/2012  . Midsternal chest pain 12/10/2011  . Dyspnea 12/07/2011  . Splenomegaly 12/07/2011  . HTN (hypertension) 10/11/2011  . Varicose veins of lower extremities with other complications 91/47/8295  . CONTACT DERMATITIS&OTHER ECZEMA DUE TO PLANTS 10/19/2009  . HERNIA 10/11/2009  . SUPRAVENTRICULAR TACHYCARDIA 12/10/2007  . FIBROIDS, UTERUS 04/03/2007  . ALLERGIC RHINITIS 04/03/2007  . Asthma, intrinsic 04/03/2007    No Known Allergies  Past Surgical History  Procedure Laterality Date  . Cholecystectomy  1995  . Appendectomy  1996  . Ovarian cyst removal  1970  . Ventral hernia repair  2000  . Foot surgery  1995 / 1996    x2  . Pilonidal cyst excision  1975  . Skin cancer excision    . Tonsillectomy    . Cesarean section      x2  . US echocardiography  12/2011    WNL - EF 55-60%, mild MR, grade 1 diastolic dysnfiction (mild)  . Laparoscopic gastric sleeve resection N/A 06/08/2013    Procedure: LAPAROSCOPIC GASTRIC SLEEVE RESECTION AND EXCISION OF SEBACEUS CYST FROM MID CHEST takedown of incarcerated ventral hernia and primary repair endoscopy;  Surgeon: [name], MD;  Location: [facility];  Service: General;  Laterality: N/A;  . Upper gi endoscopy  06/08/2013    Procedure: UPPER GI ENDOSCOPY;  Surgeon: [name], MD;  Location: [facility];  Service: General;;  . Knee arthroscopy w/ meniscal repair  07/25/13    right knee Dr. Mardelle Matte  . Moles  06/2013    removed 2 moles from under arm and  lowback   [name], MD No diagnosis found.Status post:  Laparoscopic enteral lysis and primary repair of upper abdominal trocar hernia, laparoscopic sleeve  gastrectomy, upper endoscopy per Dr. Excell Seltzer, excision of sebaceous cyst of chest   Had some pain in the right upper quadrant during the snow days.  May have pulled incision.  Had knee surgery per Carter Kitten and some worrisome nevi removed from her flank by her dermatologist.  Incisions OK.  Weight down to 270 from 319.  Will see back in 3 months.  [name] Done, MD, [facility] [phone] beeper [phone]  08/28/2013 4:49 PM

## 2013-08-31 ENCOUNTER — Encounter: Payer: BC Managed Care – PPO | Attending: Surgery | Admitting: Dietician

## 2013-08-31 DIAGNOSIS — Z713 Dietary counseling and surveillance: Secondary | ICD-10-CM | POA: Insufficient documentation

## 2013-08-31 DIAGNOSIS — E669 Obesity, unspecified: Secondary | ICD-10-CM | POA: Insufficient documentation

## 2013-09-09 ENCOUNTER — Other Ambulatory Visit: Payer: Self-pay | Admitting: Family Medicine

## 2013-11-17 ENCOUNTER — Encounter (INDEPENDENT_AMBULATORY_CARE_PROVIDER_SITE_OTHER): Payer: Self-pay | Admitting: Surgery

## 2013-11-17 ENCOUNTER — Ambulatory Visit (INDEPENDENT_AMBULATORY_CARE_PROVIDER_SITE_OTHER): Payer: BC Managed Care – PPO | Admitting: Surgery

## 2013-11-17 VITALS — BP 130/86 | HR 80 | Temp 97.8°F | Ht 68.0 in | Wt 247.0 lb

## 2013-11-17 DIAGNOSIS — IMO0001 Reserved for inherently not codable concepts without codable children: Secondary | ICD-10-CM

## 2013-11-17 DIAGNOSIS — M7918 Myalgia, other site: Secondary | ICD-10-CM

## 2013-11-17 NOTE — Progress Notes (Signed)
URGENT Office Kimberly Patton 58 y.o.  Body mass index is 37.56 kg/(m^2).  Patient Active Problem List   Diagnosis Date Noted  . Status post laparoscopic sleeve gastrectomy Dec 2014 06/08/2013  . Right flank pain 04/21/2013  . Obesity, morbid, BMI 50 or higher 12/08/2012  . Edema of both legs 06/13/2012  . Post-menopausal bleeding 04/07/2012  . Midsternal chest pain 12/10/2011  . Dyspnea 12/07/2011  . Splenomegaly 12/07/2011  . HTN (hypertension) 10/11/2011  . Varicose veins of lower extremities with other complications 93/26/7124  . CONTACT DERMATITIS&OTHER ECZEMA DUE TO PLANTS 10/19/2009  . HERNIA 10/11/2009  . SUPRAVENTRICULAR TACHYCARDIA 12/10/2007  . FIBROIDS, UTERUS 04/03/2007  . ALLERGIC RHINITIS 04/03/2007  . Asthma, intrinsic 04/03/2007    No Known Allergies  Past Surgical History  Procedure Laterality Date  . Cholecystectomy  1995  . Appendectomy  1996  . Ovarian cyst removal  1970  . Ventral hernia repair  2000  . Foot surgery  1995 / 1996    x2  . Pilonidal cyst excision  1975  . Skin cancer excision    . Tonsillectomy    . Cesarean section      x2  . US echocardiography  12/2011    WNL - EF 55-60%, mild MR, grade 1 diastolic dysnfiction (mild)  . Laparoscopic gastric sleeve resection N/A 06/08/2013    Procedure: LAPAROSCOPIC GASTRIC SLEEVE RESECTION AND EXCISION OF SEBACEUS CYST FROM MID CHEST takedown of incarcerated ventral hernia and primary repair endoscopy;  Surgeon: [name], MD;  Location: [facility];  Service: General;  Laterality: N/A;  . Upper gi endoscopy  06/08/2013    Procedure: UPPER GI ENDOSCOPY;  Surgeon: [name], MD;  Location: [facility];  Service: General;;  . Knee arthroscopy w/ meniscal repair  07/25/13    right knee Dr. Mardelle Matte  . Moles  06/2013    removed 2 moles from under arm and  lowback   [name], MD No diagnosis found.  Kimberly Patton has lost 110 pounds since her weight loss drain began. Where our repair her ventral  hernia she is not having any problems. She continues to have a trigger point in the right upper quadrant and I can feel a little tiny area that may be scar or a neuroma. I would continue to watch this area.  We brings her in today his pain around her tailbone. On examination she had a pilonidal cystectomy about 43 years ago with resultant midline scar that has evidence of old primary closure sutures scars. Between the end of eschar and her anus is a punctum which is prominent in the midline. I probed it with a pickup and was unable to retrieve any foreign body material. On either side she can palpate her coccyx and lateral bones. There is no evidence of any mass or evidence of infection. I reassured her and told her she would probably need to get a padded seat.   Since she has lost so much weight she does not have padding on the bottom. I will see her back in routine bariatric followup. [name] Done, MD, [facility] [phone] beeper [phone]  11/17/2013 2:53 PM

## 2013-11-17 NOTE — Patient Instructions (Signed)
Get a gel pad to sit on.

## 2013-11-20 ENCOUNTER — Other Ambulatory Visit: Payer: Self-pay | Admitting: Occupational Medicine

## 2013-11-20 ENCOUNTER — Ambulatory Visit: Payer: Self-pay

## 2013-11-20 DIAGNOSIS — R52 Pain, unspecified: Secondary | ICD-10-CM

## 2013-12-01 ENCOUNTER — Encounter (INDEPENDENT_AMBULATORY_CARE_PROVIDER_SITE_OTHER): Payer: BC Managed Care – PPO | Admitting: Surgery

## 2013-12-10 ENCOUNTER — Telehealth: Payer: Self-pay | Admitting: Family Medicine

## 2013-12-10 NOTE — Telephone Encounter (Signed)
Patient had surgery last year  and she said she doesn't want a physical this year.  Patient just wants a pap smear and lab work and to find out why she's losing weight. Do you want her to be seen for just the pap smear and not a physical?

## 2013-12-10 NOTE — Telephone Encounter (Signed)
Just put her in 30 min slot and schedule pap smear and abn weight loss with labs prior. I will talk with her about how we bill for it when she is here.

## 2013-12-10 NOTE — Telephone Encounter (Signed)
See below

## 2013-12-16 ENCOUNTER — Telehealth: Payer: Self-pay | Admitting: Family Medicine

## 2013-12-16 DIAGNOSIS — I10 Essential (primary) hypertension: Secondary | ICD-10-CM

## 2013-12-16 NOTE — Telephone Encounter (Signed)
Message copied by Jinny Sanders on Wed Dec 16, 2013 10:18 PM ------      Message from: Ellamae Sia      Created: Wed Dec 16, 2013  2:26 PM      Regarding: Lab orders for Thursday, 7.9.15       Lab orders for a f/u appt ------

## 2013-12-17 ENCOUNTER — Other Ambulatory Visit (INDEPENDENT_AMBULATORY_CARE_PROVIDER_SITE_OTHER): Payer: BC Managed Care – PPO

## 2013-12-17 DIAGNOSIS — I10 Essential (primary) hypertension: Secondary | ICD-10-CM

## 2013-12-17 LAB — COMPREHENSIVE METABOLIC PANEL
ALT: 17 U/L (ref 0–35)
AST: 17 U/L (ref 0–37)
Albumin: 3.9 g/dL (ref 3.5–5.2)
Alkaline Phosphatase: 84 U/L (ref 39–117)
BUN: 16 mg/dL (ref 6–23)
CO2: 32 mEq/L (ref 19–32)
Calcium: 9.7 mg/dL (ref 8.4–10.5)
Chloride: 101 mEq/L (ref 96–112)
Creatinine, Ser: 0.5 mg/dL (ref 0.4–1.2)
GFR: 134.62 mL/min (ref >60.00)
Glucose, Bld: 98 mg/dL (ref 70–99)
Potassium: 4.3 mEq/L (ref 3.5–5.1)
Sodium: 140 mEq/L (ref 135–145)
Total Bilirubin: 0.7 mg/dL (ref 0.2–1.2)
Total Protein: 7.2 g/dL (ref 6.0–8.3)

## 2013-12-17 LAB — LIPID PANEL
Cholesterol: 204 mg/dL — ABNORMAL HIGH (ref 0–200)
HDL: 52.8 mg/dL (ref >39.00)
LDL Cholesterol: 126 mg/dL — ABNORMAL HIGH (ref 0–99)
NonHDL: 151.2
Total CHOL/HDL Ratio: 4
Triglycerides: 125 mg/dL (ref 0.0–149.0)
VLDL: 25 mg/dL (ref 0.0–40.0)

## 2013-12-18 ENCOUNTER — Ambulatory Visit: Payer: Self-pay | Admitting: Family Medicine

## 2013-12-24 ENCOUNTER — Encounter: Payer: Self-pay | Admitting: Internal Medicine

## 2013-12-24 ENCOUNTER — Encounter: Payer: Self-pay | Admitting: Family Medicine

## 2013-12-24 ENCOUNTER — Ambulatory Visit (INDEPENDENT_AMBULATORY_CARE_PROVIDER_SITE_OTHER): Payer: BC Managed Care – PPO | Admitting: Family Medicine

## 2013-12-24 ENCOUNTER — Other Ambulatory Visit (HOSPITAL_COMMUNITY)
Admission: RE | Admit: 2013-12-24 | Discharge: 2013-12-24 | Disposition: A | Discharge status: Home / Self Care | Payer: BC Managed Care – PPO | Source: Ambulatory Visit | Attending: Family Medicine | Admitting: Family Medicine

## 2013-12-24 VITALS — BP 106/76 | HR 61 | Temp 98.0°F | Ht 66.0 in | Wt 238.0 lb

## 2013-12-24 DIAGNOSIS — Z0000 Encounter for general adult medical examination without abnormal findings: Secondary | ICD-10-CM

## 2013-12-24 DIAGNOSIS — J4520 Mild intermittent asthma, uncomplicated: Secondary | ICD-10-CM

## 2013-12-24 DIAGNOSIS — Z124 Encounter for screening for malignant neoplasm of cervix: Secondary | ICD-10-CM

## 2013-12-24 DIAGNOSIS — Z1212 Encounter for screening for malignant neoplasm of rectum: Secondary | ICD-10-CM

## 2013-12-24 DIAGNOSIS — J45909 Unspecified asthma, uncomplicated: Secondary | ICD-10-CM

## 2013-12-24 DIAGNOSIS — I10 Essential (primary) hypertension: Secondary | ICD-10-CM

## 2013-12-24 DIAGNOSIS — Z01419 Encounter for gynecological examination (general) (routine) without abnormal findings: Secondary | ICD-10-CM | POA: Insufficient documentation

## 2013-12-24 DIAGNOSIS — B370 Candidal stomatitis: Secondary | ICD-10-CM

## 2013-12-24 MED ORDER — NYSTATIN 100000 UNIT/ML MT SUSP: 5.0000 mL | Freq: Four times a day (QID) | OROMUCOSAL | Status: DC

## 2013-12-24 MED ORDER — DILTIAZEM HCL ER COATED BEADS 180 MG PO CP24: ORAL_CAPSULE | ORAL | Status: DC

## 2013-12-24 NOTE — Addendum Note (Signed)
Addended by: Carter Kitten on: 12/24/2013 05:21 PM   Modules accepted: Orders

## 2013-12-24 NOTE — Progress Notes (Signed)
Subjective:    Patient ID: Kimberly Patton, female    DOB: Dec 03, 1955, 58 y.o.   MRN: [id number]  HPI  58 year old female presents for annual exam.  In last year she had skin cancer ( local, non melanoma, Dr. Nevada Crane) removed and right knee surgery for meniscal tear Dr. Mardelle Matte.  She fell, lost balance on 11/20/2013, hit left knee, having fluid drained. She injured  Thoracic spine at same time, followed by Dr. Patrice Paradise, having MRI next week.  Starting PT.  She has lost 120 lbs since gastric sleeve gastrectomy on 05/2013 Wt Readings from Last 3 Encounters:  12/24/13 238 lb (107.956 kg)  11/17/13 247 lb (112.038 kg)  08/28/13 270 lb 6.4 oz (122.653 kg)    BP Readings from Last 3 Encounters:  12/24/13 106/76  11/17/13 130/86  08/28/13 120/76  BP is well controlled on  diltiazem and  HCTZ for edema. No lightheadedness. Mild edema.   Lab Results  Component Value Date   CHOL 204* 12/17/2013   HDL 52.80 12/17/2013   LDLCALC 126* 12/17/2013   TRIG 125.0 12/17/2013   CHOLHDL 4 12/17/2013    Asthma, mild intermittant:  No issues in last 1-2 years. resolved SOB with weight loss.   Review of Systems  Constitutional: Negative for fever and fatigue.  HENT: Negative for ear pain.   Eyes: Negative for pain.  Respiratory: Negative for chest tightness and shortness of breath.   Cardiovascular: Negative for chest pain, palpitations and leg swelling.  Gastrointestinal: Negative for abdominal pain.  Genitourinary: Negative for dysuria.       Objective:   Physical Exam  Constitutional: Vital signs are normal. She appears well-developed and well-nourished. She is cooperative.  Non-toxic appearance. She does not appear ill. No distress.  Obese appearing female with large pannus and weight mainly in legs  HENT:  Head: Normocephalic.  Right Ear: Hearing, tympanic membrane, external ear and ear canal normal.  Left Ear: Hearing, tympanic membrane, external ear and ear canal normal.  Nose: Nose  normal.  Erythema and cracking in tounge, white plaque on tounge  Eyes: Conjunctivae, EOM and lids are normal. Pupils are equal, round, and reactive to light. Lids are everted and swept, no foreign bodies found.  Neck: Trachea normal and normal range of motion. Neck supple. Carotid bruit is not present. No mass and no thyromegaly present.  Cardiovascular: Normal rate, regular rhythm, S1 normal, S2 normal, normal heart sounds and intact distal pulses.  Exam reveals no gallop.   No murmur heard. Pulmonary/Chest: Effort normal and breath sounds normal. No respiratory distress. She has no wheezes. She has no rhonchi. She has no rales.  Abdominal: Soft. Normal appearance and bowel sounds are normal. She exhibits no distension, no fluid wave, no abdominal bruit and no mass. There is no hepatosplenomegaly. There is no tenderness. There is no rebound, no guarding and no CVA tenderness. No hernia.  Genitourinary: Vagina normal and uterus normal. No breast swelling, tenderness, discharge or bleeding. Pelvic exam was performed with patient supine. There is no rash, tenderness or lesion on the right labia. There is no rash, tenderness or lesion on the left labia. Uterus is not enlarged and not tender. Cervix exhibits friability. Cervix exhibits no motion tenderness and no discharge. Right adnexum displays no mass, no tenderness and no fullness. Left adnexum displays no mass, no tenderness and no fullness.  Some bleeding after pap.  Lymphadenopathy:    She has no cervical adenopathy.    She has  no axillary adenopathy.  Neurological: She is alert. She has normal strength. No cranial nerve deficit or sensory deficit.  Skin: Skin is warm, dry and intact. No rash noted.  Psychiatric: Her speech is normal and behavior is normal. Judgment normal. Her mood appears not anxious. Cognition and memory are normal. She does not exhibit a depressed mood.          Assessment & Plan:  The patient's preventative  maintenance and recommended screening tests for an annual wellness exam were reviewed in full today. Brought up to date unless services declined.  Counselled on the importance of diet, exercise, and its role in overall health and mortality. The patient's FH and SH was reviewed, including their home life, tobacco status, and drug and alcohol status.   Vaccines:uptodate with td, due 2016 Colon: ifob due DEXA:Not indicated until age 41  Mammo: due, nml 2013 Nonsmoker  pap/DVE: nml 2013 on q2 year repeat, due this year

## 2013-12-24 NOTE — Progress Notes (Signed)
Pre visit review using our clinic review tool, if applicable. No additional management support is needed unless otherwise documented below in the visit note. 

## 2013-12-24 NOTE — Patient Instructions (Addendum)
Decrease diltiazem to 180 mg daily.  Continue HCTZ 25 mg daily.  Check BP at home, call if greater than 140/90, call in a few weeks with BP measurements. Call to look into if mammogram is due.  Stop at lab on way out to pick up stool test.

## 2013-12-24 NOTE — Assessment & Plan Note (Signed)
Lower diltiazem dose to 180 mg daily, continue HCTZ. Follow BP if continued weight loss, may be able to decrease further.

## 2013-12-24 NOTE — Assessment & Plan Note (Signed)
No longer an issue with weight loss.

## 2013-12-24 NOTE — Assessment & Plan Note (Signed)
Treat with nystatin swish and spit.

## 2013-12-29 LAB — CYTOLOGY - PAP

## 2013-12-31 ENCOUNTER — Other Ambulatory Visit: Payer: Self-pay | Admitting: Family Medicine

## 2013-12-31 ENCOUNTER — Other Ambulatory Visit: Payer: Self-pay

## 2013-12-31 DIAGNOSIS — Z1231 Encounter for screening mammogram for malignant neoplasm of breast: Secondary | ICD-10-CM

## 2014-01-01 ENCOUNTER — Ambulatory Visit
Admission: RE | Admit: 2014-01-01 | Discharge: 2014-01-01 | Disposition: A | Discharge status: Home / Self Care | Payer: BC Managed Care – PPO | Source: Ambulatory Visit

## 2014-01-01 DIAGNOSIS — Z1231 Encounter for screening mammogram for malignant neoplasm of breast: Secondary | ICD-10-CM

## 2014-01-04 ENCOUNTER — Other Ambulatory Visit (INDEPENDENT_AMBULATORY_CARE_PROVIDER_SITE_OTHER): Payer: BC Managed Care – PPO

## 2014-01-04 DIAGNOSIS — Z1212 Encounter for screening for malignant neoplasm of rectum: Secondary | ICD-10-CM

## 2014-01-04 LAB — FECAL OCCULT BLOOD, IMMUNOCHEMICAL: Fecal Occult Bld: POSITIVE — AB

## 2014-01-05 ENCOUNTER — Telehealth: Payer: Self-pay

## 2014-01-05 DIAGNOSIS — Z1211 Encounter for screening for malignant neoplasm of colon: Secondary | ICD-10-CM

## 2014-01-05 NOTE — Telephone Encounter (Signed)
Returned Kimberly Patton's call.  She states every since her by pass sleeve she has had trouble with bowel movements.  She states she really has to strain with bowel movements.  She was wondering if that may be the cause of her positive stool test.  I advised that hard stool/straining could cause hemorrhoids or small tears in the lining of the rectum which in turn could cause her to have blood in her stools.  She would like to repeat test before moving forward with colonoscopy.  She states she take a stool softner and miralax every couple of weeks.   I recommended that she increase her water intake and take miralax and stool softner daily until she is able to have a regular BM w/o straining and then repeat the test.  Patient is in agreement with plan.  She will stop by the office tomorrow to pick up a new IFOB test.

## 2014-01-05 NOTE — Telephone Encounter (Signed)
Pt left v/m requesting cb; pt had gastric sleeve in 05/2013 and pt wants to know if pt should contact surgeon that did gastric sleeve about blood in stool. Pt wants to know how much blood was seen. Pt request cb.

## 2014-01-05 NOTE — Telephone Encounter (Signed)
Agreed. Good plan.

## 2014-01-15 ENCOUNTER — Encounter: Payer: Self-pay | Admitting: Family Medicine

## 2014-01-26 ENCOUNTER — Other Ambulatory Visit (INDEPENDENT_AMBULATORY_CARE_PROVIDER_SITE_OTHER): Payer: BC Managed Care – PPO

## 2014-01-26 DIAGNOSIS — Z1211 Encounter for screening for malignant neoplasm of colon: Secondary | ICD-10-CM

## 2014-01-26 LAB — FECAL OCCULT BLOOD, IMMUNOCHEMICAL: Fecal Occult Bld: NEGATIVE

## 2014-01-28 ENCOUNTER — Telehealth: Payer: Self-pay | Admitting: Family Medicine

## 2014-01-28 ENCOUNTER — Telehealth: Payer: Self-pay | Admitting: *Deleted

## 2014-01-28 NOTE — Telephone Encounter (Signed)
[place] notified as instructed by telephone.

## 2014-01-28 NOTE — Telephone Encounter (Signed)
Pt said she had a office visit with Dr. Evalee Jefferson today, and he wants to prescribe her an antiinflammatory but given her BP issues and other problems Dr. Evalee Jefferson wanted Dr. Rometta Emery opinion of what Rx he should prescribed

## 2014-01-28 NOTE — Telephone Encounter (Signed)
See result note.  

## 2014-01-28 NOTE — Telephone Encounter (Signed)
Okay to use NSAIS in limited fashion, ie not long term. I would choose meloxicam 15 mg daily

## 2014-01-28 NOTE — Telephone Encounter (Signed)
Patient returned your call.  Call her back on her cell phone.

## 2014-02-04 ENCOUNTER — Other Ambulatory Visit: Payer: Self-pay | Admitting: Family Medicine

## 2014-04-12 ENCOUNTER — Encounter: Payer: Self-pay | Admitting: Family Medicine

## 2014-07-29 ENCOUNTER — Encounter: Payer: Self-pay | Admitting: Family Medicine

## 2014-07-29 ENCOUNTER — Telehealth: Payer: Self-pay | Admitting: Family Medicine

## 2014-07-29 NOTE — Telephone Encounter (Signed)
Left detailed message on voicemail.  

## 2014-07-29 NOTE — Telephone Encounter (Signed)
Pt has appt with Dr Otilio Connors on 07/30/14 at 8:15 AM.

## 2014-07-29 NOTE — Telephone Encounter (Signed)
error    This encounter was created in error - please disregard.

## 2014-07-29 NOTE — Telephone Encounter (Signed)
Nasal saline and/or OTC flonase 2 sprays per nostril, either or both may help.  Will see tomorrow AM.  Thanks.

## 2014-07-29 NOTE — Telephone Encounter (Signed)
tient Name: Kimberly Patton DOB: 07-22-1955 Initial Comment caller states she has ear pain Nurse Assessment Nurse: Ronnald Ramp, RN, Miranda Date/Time (Eastern Time): 07/29/2014 2:24:58 PM Confirm and document reason for call. If symptomatic, describe symptoms. ---Caller states she is having pain in her ear and nasal congestion. She has an appt for tomorrow but wants to know what to do tonight for symptom relief. Has the patient traveled out of the country within the last 30 days? ---Not Applicable Does the patient require triage? ---Yes Related visit to physician within the last 2 weeks? ---No Does the PT have any chronic conditions? (i.e. diabetes, asthma, etc.) ---Yes List chronic conditions. ---HTN Guidelines Guideline Title Affirmed Question Affirmed Notes Earache Earache (Exceptions: transient ear pain of < 60 minutes duration, earache occurring during air travel Final Disposition User See Physician within 24 Hours Ronnald Ramp, RN, Miranda Comments Pt already has an appt for tomorrow.

## 2014-07-30 ENCOUNTER — Ambulatory Visit (INDEPENDENT_AMBULATORY_CARE_PROVIDER_SITE_OTHER): Payer: BC Managed Care – PPO | Admitting: Family Medicine

## 2014-07-30 ENCOUNTER — Encounter: Payer: Self-pay | Admitting: Family Medicine

## 2014-07-30 VITALS — BP 108/80 | HR 71 | Temp 97.4°F | Wt 222.0 lb

## 2014-07-30 DIAGNOSIS — B9789 Other viral agents as the cause of diseases classified elsewhere: Secondary | ICD-10-CM

## 2014-07-30 DIAGNOSIS — J069 Acute upper respiratory infection, unspecified: Secondary | ICD-10-CM

## 2014-07-30 MED ORDER — AMOXICILLIN-POT CLAVULANATE 875-125 MG PO TABS: 1.0000 | ORAL_TABLET | Freq: Two times a day (BID) | ORAL | Status: DC

## 2014-07-30 MED ORDER — FLUTICASONE PROPIONATE 50 MCG/ACT NA SUSP: 2.0000 | Freq: Every day | NASAL | Status: DC

## 2014-07-30 NOTE — Patient Instructions (Signed)
Use flonase and gently try to pop your ears.  If you have more sinus pain after a few more days, then start the antibiotic.  Take care.  Get some rest and drink plenty of fluids.

## 2014-07-30 NOTE — Assessment & Plan Note (Signed)
With ETD, d/w pt about anatomy/flonase/valsalva.  Hold augmentin for now.  See AVS.  Nontoxic.  F/u prn.

## 2014-07-30 NOTE — Progress Notes (Signed)
Pre visit review using our clinic review tool, if applicable. No additional management support is needed unless otherwise documented below in the visit note.  H/o gastric sleeve, not bypass.  Mult sick contacts.  Sx started about 4 days ago.  First noted ST, sinus pressure, post nasal gtt, raw feeling in throat.  Voice is affected.  R ear pain.  No L ear pain.  No cough.  No fevers, no vomiting.  No diarrhea.  No abd pain.  No wheeze.  Not SOB.  Going to start mucinex today.  Tried afrin but couldn't tolerate it (burning sensation in nose).  Meds, vitals, and allergies reviewed.   ROS: See HPI.  Otherwise, noncontributory.  GEN: nad, alert and oriented HEENT: mucous membranes moist, tm w/o erythema, nasal exam w/o erythema, clear discharge noted,  OP with cobblestoning, R ETD noted.  Sinuses not ttp NECK: supple w/o LA CV: rrr.   PULM: ctab, no inc wob EXT: no edema SKIN: no acute rash

## 2014-08-03 ENCOUNTER — Other Ambulatory Visit: Payer: Self-pay | Admitting: Family Medicine

## 2014-08-10 ENCOUNTER — Other Ambulatory Visit: Payer: Self-pay | Admitting: Family Medicine

## 2014-12-23 ENCOUNTER — Encounter: Payer: Self-pay | Admitting: Internal Medicine

## 2014-12-23 ENCOUNTER — Ambulatory Visit (INDEPENDENT_AMBULATORY_CARE_PROVIDER_SITE_OTHER): Payer: BC Managed Care – PPO | Admitting: Internal Medicine

## 2014-12-23 VITALS — BP 128/78 | HR 92 | Temp 98.8°F | Wt 228.0 lb

## 2014-12-23 DIAGNOSIS — L03119 Cellulitis of unspecified part of limb: Secondary | ICD-10-CM | POA: Diagnosis not present

## 2014-12-23 MED ORDER — CEPHALEXIN 500 MG PO CAPS: 500.0000 mg | ORAL_CAPSULE | Freq: Three times a day (TID) | ORAL | Status: DC

## 2014-12-23 NOTE — Progress Notes (Signed)
Pre visit review using our clinic review tool, if applicable. No additional management support is needed unless otherwise documented below in the visit note. 

## 2014-12-23 NOTE — Progress Notes (Signed)
Subjective:    Patient ID: Kimberly Patton, female    DOB: 1955-09-08, 60 y.o.   MRN: [id number]  HPI  Pt presents to the clinic today with c/o a rash. She reports she was stung by a wasp 1 week ago. Over the last 2 days, the rash has spread from her left ankle to the inside of her left leg. She reports the area is red and warm to touch. She denies fever, chills or body aches. She has taken Tylenol and iced the area without any relief. 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 She has an epipen but has not had to use it.  Review of Systems  Past Medical History  Diagnosis Date  . ASTHMA, PERSISTENT, MODERATE 04/03/2007  . ALLERGIC RHINITIS 04/03/2007  . Hypertension   . Morbid obesity   . Paroxysmal SVT (supraventricular tachycardia)     a. 11/2011 48h Holter: RSR, rare PVC's, occas PAC's  . Dyspnea on exertion     a. 11/2007 Echo: EF 60%.  . Joint pain     HIPS / LEGS  . Knee injury     RT  . GERD (gastroesophageal reflux disease)   . 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 Ventral hernia     Current Outpatient Prescriptions  Medication Sig Dispense Refill  . albuterol (PROVENTIL HFA;VENTOLIN HFA) 108 (90 BASE) MCG/ACT inhaler Inhale 2 puffs into the lungs every 6 (six) hours as needed. For wheezing    . Calcium Citrate-Vitamin D (CITRACAL + D PO) Take 1 tablet by mouth daily.    . Cyanocobalamin (VITAMIN B-12 SL) Place 1 tablet under the tongue every morning.    . diltiazem (CARDIZEM CD) 180 MG 24 hr capsule TAKE 1 CAPSULE (180 MG TOTAL) BY MOUTH DAILY. 30 capsule 11  . fluticasone (FLONASE) 50 MCG/ACT nasal spray Place 2 sprays into both nostrils daily. 16 g 6  . hydrochlorothiazide (HYDRODIURIL) 25 MG tablet TAKE 1 TABLET BY MOUTH EVERY DAY 30 tablet 5  . Misc Natural Products (OSTEO BI-FLEX TRIPLE STRENGTH) TABS Take 2 tablets by mouth every morning.    . Multiple Vitamins-Minerals (MULTIVITAMIN WITH MINERALS) tablet Take 1 tablet by mouth daily.    . naproxen sodium  (ALEVE) 220 MG tablet Take 220 mg by mouth as needed.     No current facility-administered medications for this visit.    No Known Allergies  Family History  Problem Relation Age of Onset  . Diabetes    . Hypertension    . Stroke    . Hemophilia    . Breast cancer Mother   . Diabetes Mother   . Hypertension Mother   . Diabetes Father   . Hypertension Father   . Kidney failure Mother     History   Social History  . Marital Status: Married    Spouse Name: N/A  . Number of Children: 2  . Years of Education: N/A   Occupational History  . teacher [employer]   Social History Main Topics  . Smoking status: Never Smoker   . Smokeless tobacco: Never Used     Comment: smoke at age [age]  . Alcohol Use: No  . Drug Use: No  . Sexual Activity:    Partners: Male    Birth Control/ Protection: Post-menopausal   Other Topics Concern  . Not on file   Social History Narrative     Constitutional: Denies fever, malaise, fatigue, headache or abrupt weight changes.  Respiratory: Denies difficulty  breathing, shortness of breath, cough or sputum production.   Cardiovascular: Denies chest pain, chest tightness, palpitations or swelling in the hands or feet.  Skin: Pt reports rash. Denies lesions or ulcercations.    No other specific complaints in a complete review of systems (except as listed in HPI above).     Objective:   Physical Exam   BP 128/78 mmHg  Pulse 92  Temp(Src) 98.8 F (37.1 C) (Oral)  Wt 228 lb (103.42 kg)  SpO2 98% Wt Readings from Last 3 Encounters:  12/23/14 228 lb (103.42 kg)  07/30/14 222 lb (100.699 kg)  12/24/13 238 lb (107.956 kg)    General: Appears her stated age, well developed, well nourished in NAD. Skin:  Convalescent maculopapular rash on left medial lower lag. Warm to touch. Cardiovascular: Normal rate and rhythm. S1,S2 noted.  No murmur, rubs or gallops noted. Pulmonary/Chest: Normal effort and positive vesicular breath  sounds. No respiratory distress. No wheezes, rales or ronchi noted.  Neurological: Alert and oriented.   BMET    Component Value Date/Time   NA 140 12/17/2013 0845   K 4.3 12/17/2013 0845   CL 101 12/17/2013 0845   CO2 32 12/17/2013 0845   GLUCOSE 98 12/17/2013 0845   BUN 16 12/17/2013 0845   CREATININE 0.5 12/17/2013 0845   CALCIUM 9.7 12/17/2013 0845   GFRNONAA >90 06/08/2013 1200   GFRAA >90 06/08/2013 1200    Lipid Panel     Component Value Date/Time   CHOL 204* 12/17/2013 0845   TRIG 125.0 12/17/2013 0845   HDL 52.80 12/17/2013 0845   CHOLHDL 4 12/17/2013 0845   VLDL 25.0 12/17/2013 0845   LDLCALC 126* 12/17/2013 0845    CBC    Component Value Date/Time   WBC 6.8 06/10/2013 0530   RBC 4.20 06/10/2013 0530   HGB 11.1* 06/10/2013 0530   HCT 35.1* 06/10/2013 0530   PLT 281 06/10/2013 0530   MCV 83.6 06/10/2013 0530   MCH 26.4 06/10/2013 0530   MCHC 31.6 06/10/2013 0530   RDW 15.0 06/10/2013 0530   LYMPHSABS 2.4 06/10/2013 0530   MONOABS 0.6 06/10/2013 0530   EOSABS 0.1 06/10/2013 0530   BASOSABS 0.0 06/10/2013 0530    Hgb A1C Lab Results  Component Value Date   HGBA1C  12/03/2007    5.5 (NOTE)   The ADA recommends the following therapeutic goal for glycemic   control related to Hgb A1C measurement:   Goal of Therapy:   < 7.0% Hgb A1C   Reference: American Diabetes Association: Clinical Practice   Recommendations 2008, Diabetes Care,  2008, 31:(Suppl 1).        Assessment & Plan:   Cellulitis of left lower leg:  Advised her to keep the leg elevated Ibuprofen as needed for inflammation eRx for Keflex 500 mg TID  Watch for fever, chills, or streaking  RTC as needed or if symptoms or worsen

## 2014-12-23 NOTE — Patient Instructions (Signed)
We have prescribed you Keflex antibiotic, 500 mg, three times a day, for 10 days. Be sure to take all of the medicine.  You should notice some improvement in 4-5 days, but the rash may not fully resolve until you have taken 10 days of antibiotics. Call us if you develop a fever, if the rash gets worse, if you see red streaking around the rash, or if the rash is still a problem after 10 days.  Cellulitis Cellulitis is an infection of the skin and the tissue beneath it. The infected area is usually red and tender. Cellulitis occurs most often in the arms and lower legs.  CAUSES  Cellulitis is caused by bacteria that enter the skin through cracks or cuts in the skin. The most common types of bacteria that cause cellulitis are staphylococci and streptococci. SIGNS AND SYMPTOMS   Redness and warmth.  Swelling.  Tenderness or pain.  Fever. DIAGNOSIS  Your health care provider can usually determine what is wrong based on a physical exam. Blood tests may also be done. TREATMENT  Treatment usually involves taking an antibiotic medicine. HOME CARE INSTRUCTIONS   Take your antibiotic medicine as directed by your health care provider. Finish the antibiotic even if you start to feel better.  Keep the infected arm or leg elevated to reduce swelling.  Apply a warm cloth to the affected area up to 4 times per day to relieve pain.  Take medicines only as directed by your health care provider.  Keep all follow-up visits as directed by your health care provider. SEEK MEDICAL CARE IF:   You notice red streaks coming from the infected area.  Your red area gets larger or turns dark in color.  Your bone or joint underneath the infected area becomes painful after the skin has healed.  Your infection returns in the same area or another area.  You notice a swollen bump in the infected area.  You develop new symptoms.  You have a fever. SEEK IMMEDIATE MEDICAL CARE IF:   You feel very  sleepy.  You develop vomiting or diarrhea.  You have a general ill feeling (malaise) with muscle aches and pains. MAKE SURE YOU:   Understand these instructions.  Will watch your condition.  Will get help right away if you are not doing well or get worse. Document Released: 03/07/2005 Document Revised: 10/12/2013 Document Reviewed: 08/13/2011 [facility] Patient Information 2015 [place], [place]. This information is not intended to replace advice given to you by your health care provider. Make sure you discuss any questions you have with your health care provider.

## 2014-12-24 ENCOUNTER — Other Ambulatory Visit: Payer: Self-pay

## 2014-12-24 ENCOUNTER — Telehealth: Payer: Self-pay | Admitting: Family Medicine

## 2014-12-24 DIAGNOSIS — Z1231 Encounter for screening mammogram for malignant neoplasm of breast: Secondary | ICD-10-CM

## 2014-12-24 NOTE — Telephone Encounter (Signed)
Pt has appt on 12/25/14 at 9:15 am at [facility].

## 2014-12-24 NOTE — Telephone Encounter (Signed)
Patient Name: Kimberly Patton DOB: [birth year] Initial Comment Caller states was stung by wasp on leg, turned into cellulitis, was put on Keflex, it has gotten better. Nurse Assessment Nurse: Ronnald Ramp, RN, Miranda Date/Time (Eastern Time): 12/24/2014 2:09:04 PM Confirm and document reason for call. If symptomatic, describe symptoms. ---Caller states she was seen at office yesterday and diagnosed with cellulitis on her leg and started on antibiotics. She feels the redness is spreading and there is an area that looks purple now. Has the patient traveled out of the country within the last 30 days? ---Not Applicable Does the patient require triage? ---Yes Related visit to physician within the last 2 weeks? ---Yes Does the PT have any chronic conditions? (i.e. diabetes, asthma, etc.) ---Yes List chronic conditions. ---HTN, Edema Guidelines Guideline Title Affirmed Question Affirmed Notes Cellulitis on Antibiotic Follow-up Call [1] Taking antibiotic > 24 hours AND [2] cellulitis symptoms are WORSE (e.g., spreading redness, pain, swelling) Final Disposition User See Physician within 24 Hours Jones, RN, Miranda Comments No appts available today. Appt scheduled for tomorrow at 9:15 at [facility] clinic. Caller really wants to see Webb Silversmith today if at all possible since she saw her leg yesterday. Told caller I would make a note on the chart and request Rollene Fare follow up, otherwise keep appt for tomorrow. Referrals REFERRED TO PCP OFFICE Disagree/Comply: Comply

## 2014-12-25 ENCOUNTER — Ambulatory Visit (INDEPENDENT_AMBULATORY_CARE_PROVIDER_SITE_OTHER): Payer: BC Managed Care – PPO | Admitting: Family Medicine

## 2014-12-25 ENCOUNTER — Encounter: Payer: Self-pay | Admitting: Family Medicine

## 2014-12-25 VITALS — BP 110/70 | HR 66 | Temp 98.1°F | Ht 66.0 in | Wt 226.5 lb

## 2014-12-25 DIAGNOSIS — L03119 Cellulitis of unspecified part of limb: Secondary | ICD-10-CM

## 2014-12-25 DIAGNOSIS — L02419 Cutaneous abscess of limb, unspecified: Secondary | ICD-10-CM

## 2014-12-25 MED ORDER — DOXYCYCLINE HYCLATE 100 MG PO TABS: 100.0000 mg | ORAL_TABLET | Freq: Two times a day (BID) | ORAL | Status: AC

## 2014-12-25 NOTE — Assessment & Plan Note (Signed)
Patient has a superficial cellulitis that actually does not seem to have expanded over the course of time. We discussed with her to continue to use the scene antibiotically but patient given a prescription for doxycycline in case she feels that further broad coverage as necessary. This would cover MRSA. Patient given the prescription today and will follow-up as scheduled. Patient will call if any symptoms such as worsening redness, streaking, fevers or chills occur.

## 2014-12-25 NOTE — Progress Notes (Signed)
[facility] [address], [place] [address] Phone: [phone] Subjective:   CC: Follow-up on cellulitis  PPI:[id number] Kimberly Patton is a 59 y.o. female coming in with complaint of patient's provider 2 days ago and was diagnosed with a cellulitis. Patient was given Keflex. Patient initially had this may be after a tingling from a walks proximal pulley 7-10 days ago. Patient was doing maybe a little bit better for 1 day but unfortunately the redness seemed to be worse. Patient is now having more of a discoloration in the area as well. Patient has been doing anti-inflammatory's for pain. Denies any numbness or tingling. Patient states that there was some darkening of the redness and she went to make sure that this is normal. Patient denies any new pain or any new numbness. Patient is just concerned that she is not sure if the medication needs to be changed  Past Medical History  Diagnosis Date  . ASTHMA, PERSISTENT, MODERATE 04/03/2007  . ALLERGIC RHINITIS 04/03/2007  . Hypertension   . Morbid obesity   . Paroxysmal SVT (supraventricular tachycardia)     a. 11/2011 48h Holter: RSR, rare PVC's, occas PAC's  . Dyspnea on exertion     a. 11/2007 Echo: EF 60%.  . Joint pain     HIPS / LEGS  . Knee injury     RT  . GERD (gastroesophageal reflux disease)   . 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 Ventral hernia    Past Surgical History  Procedure Laterality Date  . Cholecystectomy  1995  . Appendectomy  1996  . Ovarian cyst removal  1970  . Ventral hernia repair  2000  . Foot surgery  1995 / 1996    x2  . Pilonidal cyst excision  1975  . Skin cancer excision    . Tonsillectomy    . Cesarean section      x2  . US echocardiography  12/2011    WNL - EF 55-60%, mild MR, grade 1 diastolic dysnfiction (mild)  . Laparoscopic gastric sleeve resection N/A 06/08/2013    Procedure: LAPAROSCOPIC GASTRIC SLEEVE RESECTION AND EXCISION OF SEBACEUS CYST FROM MID CHEST  takedown of incarcerated ventral hernia and primary repair endoscopy;  Surgeon: [name], MD;  Location: [facility];  Service: General;  Laterality: N/A;  . Upper gi endoscopy  06/08/2013    Procedure: UPPER GI ENDOSCOPY;  Surgeon: [name], MD;  Location: [facility];  Service: General;;  . Knee arthroscopy w/ meniscal repair  07/25/13    right knee Dr. Mardelle Matte  . Moles  06/2013    removed 2 moles from under arm and  lowback   History  Substance Use Topics  . Smoking status: Never Smoker   . Smokeless tobacco: Never Used     Comment: smoke at age 65-12  . Alcohol Use: No   No Known Allergies Family History  Problem Relation Age of Onset  . Diabetes    . Hypertension    . Stroke    . Hemophilia    . Breast cancer Mother   . Diabetes Mother   . Hypertension Mother   . Diabetes Father   . Hypertension Father   . Kidney failure Mother    No Known Allergies     Past medical history, social, surgical and family history all reviewed in electronic medical record.   Review of Systems: No headache, visual changes, nausea, vomiting, diarrhea, constipation, dizziness, abdominal pain, skin rash,  fevers, chills, night sweats, weight loss, swollen lymph nodes, body aches, joint swelling, muscle aches, chest pain, shortness of breath, mood changes.   Objective Blood pressure 110/70, pulse 66, temperature 98.1 F (36.7 C), temperature source Oral, height 5\' 6"  (1.676 m), weight 226 lb 8 oz (102.74 kg), SpO2 98 %.  General: No apparent distress alert and oriented x3 mood and affect normal, dressed appropriately.  HEENT: Pupils equal, extraocular movements intact  Respiratory: Patient's speak in full sentences and does not appear short of breath  Cardiovascular: No lower extremity edema, non tender, no erythema  Skin: Patient's right lower extremity on the medial aspect of the ankle does have 2 areas of erythematous. These around in nature but no significant central clearing noted.  Neurovascular intact distally of the foot in full range of motion of the ankle. Patient has lines from where she drew  previously in the redness is actually dissipated some from the previous lines. Abdomen: Soft nontender  Neuro: Cranial nerves II through XII are intact, neurovascularly intact in all extremities with 2+ DTRs and 2+ pulses.  Lymph: No lymphadenopathy of posterior or anterior cervical chain or axillae bilaterally.  Gait normal with good balance and coordination.  MSK:  Non tender with full range of motion and good stability and symmetric strength and tone of shoulders, elbows, wrist, hip, knee and ankles bilaterally.     Impression and Recommendations:     This case required medical decision making of moderate complexity.

## 2014-12-25 NOTE — Patient Instructions (Signed)
Good to see you Lets broaden your antibiotic.  Doxycycline 2 times daily for 10 days  Ice if it hurts More redness or pain give [place] a call or seek medical attention

## 2014-12-25 NOTE — Progress Notes (Signed)
Pre visit review using our clinic review tool, if applicable. No additional management support is needed unless otherwise documented below in the visit note. 

## 2015-01-02 ENCOUNTER — Telehealth: Payer: Self-pay | Admitting: Family Medicine

## 2015-01-02 NOTE — Telephone Encounter (Signed)
Please call and schedule CPE with fasting labs prior with Dr. Bedsole. 

## 2015-01-03 ENCOUNTER — Ambulatory Visit
Admission: RE | Admit: 2015-01-03 | Discharge: 2015-01-03 | Disposition: A | Discharge status: Home / Self Care | Payer: BC Managed Care – PPO | Source: Ambulatory Visit

## 2015-01-03 DIAGNOSIS — Z1231 Encounter for screening mammogram for malignant neoplasm of breast: Secondary | ICD-10-CM

## 2015-01-03 NOTE — Telephone Encounter (Signed)
Labs 8/4  cpx 8/11 Pt aware

## 2015-01-06 ENCOUNTER — Telehealth: Payer: Self-pay | Admitting: Family Medicine

## 2015-01-06 DIAGNOSIS — I10 Essential (primary) hypertension: Secondary | ICD-10-CM

## 2015-01-06 NOTE — Telephone Encounter (Signed)
-----   Message from Ellamae Sia sent at 01/06/2015  3:16 PM EDT ----- Regarding: Lab orders for Thursday, 8.4.16 Patient is scheduled for CPX labs, please order future labs, Thanks , Karna Christmas

## 2015-01-13 ENCOUNTER — Telehealth: Payer: Self-pay | Admitting: *Deleted

## 2015-01-13 ENCOUNTER — Other Ambulatory Visit: Payer: Self-pay | Admitting: Family Medicine

## 2015-01-13 ENCOUNTER — Other Ambulatory Visit (INDEPENDENT_AMBULATORY_CARE_PROVIDER_SITE_OTHER): Payer: BC Managed Care – PPO

## 2015-01-13 DIAGNOSIS — I10 Essential (primary) hypertension: Secondary | ICD-10-CM

## 2015-01-13 DIAGNOSIS — Z209 Contact with and (suspected) exposure to unspecified communicable disease: Secondary | ICD-10-CM

## 2015-01-13 LAB — COMPREHENSIVE METABOLIC PANEL
ALT: 14 U/L (ref 0–35)
AST: 14 U/L (ref 0–37)
Albumin: 4 g/dL (ref 3.5–5.2)
Alkaline Phosphatase: 73 U/L (ref 39–117)
BUN: 18 mg/dL (ref 6–23)
CO2: 32 mEq/L (ref 19–32)
Calcium: 9.7 mg/dL (ref 8.4–10.5)
Chloride: 102 mEq/L (ref 96–112)
Creatinine, Ser: 0.49 mg/dL (ref 0.40–1.20)
GFR: 137.29 mL/min (ref >60.00)
Glucose, Bld: 81 mg/dL (ref 70–99)
Potassium: 3.8 mEq/L (ref 3.5–5.1)
Sodium: 141 mEq/L (ref 135–145)
Total Bilirubin: 0.6 mg/dL (ref 0.2–1.2)
Total Protein: 6.9 g/dL (ref 6.0–8.3)

## 2015-01-13 LAB — LIPID PANEL
Cholesterol: 218 mg/dL — ABNORMAL HIGH (ref 0–200)
HDL: 67.3 mg/dL (ref >39.00)
LDL Cholesterol: 127 mg/dL — ABNORMAL HIGH (ref 0–99)
NonHDL: 150.46
Total CHOL/HDL Ratio: 3
Triglycerides: 116 mg/dL (ref 0.0–149.0)
VLDL: 23.2 mg/dL (ref 0.0–40.0)

## 2015-01-13 NOTE — Telephone Encounter (Signed)
I spoke pt, and she will come tomorrow morning to give urine specimen. Per Terri the lab will only do a Hep C Ab Qual as a reflex test if Hep C antibody is positive. I will order Hep C antibody with reflex to [facility]

## 2015-01-13 NOTE — Telephone Encounter (Signed)
-----   Message from Owens Loffler, MD sent at 01/13/2015  3:16 PM EDT ----- Regarding: RE: adding std labs to todays labs That is gonorrhea, chlamydia  ----- Message -----    From: Marchia Bond    Sent: 01/13/2015   2:13 PM      To: Owens Loffler, MD Subject: RE: adding std labs to todays labs             Thank you for the orders. She had already urinated this am, but she could come back give a 1st am void specimen. Would you mind double checking everything I ordered before I sign them? I'm not familiar with gc/ cmz- I'm assuming it's the urine g/c chlamydia. Please let me know if I need to change anything.  Thanks, Aniceto Boss ----- Message -----    From: Owens Loffler, MD    Sent: 01/13/2015   8:15 AM      To: Marchia Bond Subject: RE: adding std labs to todays labs             HIV RPR GC/CMZ - urine Trich - urine Hep C AB, qual HSV, I and II, blood PCR HEP B, full panel  Exp comm disease  If you need further clarification, let me know  ----- Message -----    From: Marchia Bond    Sent: 01/13/2015   7:48 AM      To: Owens Loffler, MD Subject: adding std labs to todays labs                 Pt had cpx labs this am and requested full panel of std testing, she found out last week that her husband was having "unprotected sex with men", and her lawyer advised std testing. Is this ok?    Thanks Aniceto Boss

## 2015-01-13 NOTE — Addendum Note (Signed)
Addended by: Marchia Bond on: 01/13/2015 03:39 PM   Modules accepted: Orders

## 2015-01-14 LAB — HIV ANTIBODY (ROUTINE TESTING W REFLEX): HIV 1&2 Ab, 4th Generation: NONREACTIVE

## 2015-01-14 LAB — HEPATITIS B SURFACE ANTIBODY, QUANTITATIVE: Hepatitis B-Post: 0 m[IU]/mL

## 2015-01-14 LAB — HERPES SIMPLEX VIRUS(HSV) DNA BY PCR
HSV 1 DNA: NOT DETECTED
HSV 2 DNA: NOT DETECTED

## 2015-01-14 LAB — RPR

## 2015-01-14 LAB — HEPATITIS C ANTIBODY: HCV Ab: NEGATIVE

## 2015-01-14 LAB — HEPATITIS B SURFACE ANTIGEN: Hepatitis B Surface Ag: NEGATIVE

## 2015-01-14 LAB — HEPATITIS B CORE ANTIBODY, TOTAL: Hep B Core Total Ab: NONREACTIVE

## 2015-01-14 NOTE — Addendum Note (Signed)
Addended by: Daralene Milch C on: 01/14/2015 11:10 AM   Modules accepted: Orders

## 2015-01-15 LAB — TRICHOMONAS VAGINALIS, PROBE AMP: T vaginalis RNA: NEGATIVE

## 2015-01-15 LAB — GC/CHLAMYDIA PROBE AMP, URINE
Chlamydia, Swab/Urine, PCR: NEGATIVE
GC Probe Amp, Urine: NEGATIVE

## 2015-01-20 ENCOUNTER — Ambulatory Visit (INDEPENDENT_AMBULATORY_CARE_PROVIDER_SITE_OTHER)
Admission: RE | Admit: 2015-01-20 | Discharge: 2015-01-20 | Disposition: A | Discharge status: Home / Self Care | Payer: BC Managed Care – PPO | Source: Ambulatory Visit | Attending: Family Medicine | Admitting: Family Medicine

## 2015-01-20 ENCOUNTER — Ambulatory Visit (INDEPENDENT_AMBULATORY_CARE_PROVIDER_SITE_OTHER): Payer: BC Managed Care – PPO | Admitting: Family Medicine

## 2015-01-20 ENCOUNTER — Other Ambulatory Visit (HOSPITAL_COMMUNITY)
Admission: RE | Admit: 2015-01-20 | Discharge: 2015-01-20 | Disposition: A | Discharge status: Home / Self Care | Payer: BC Managed Care – PPO | Source: Ambulatory Visit | Attending: Family Medicine | Admitting: Family Medicine

## 2015-01-20 ENCOUNTER — Encounter: Payer: Self-pay | Admitting: Family Medicine

## 2015-01-20 VITALS — BP 128/66 | HR 68 | Temp 98.5°F | Ht 66.0 in | Wt 228.0 lb

## 2015-01-20 DIAGNOSIS — I10 Essential (primary) hypertension: Secondary | ICD-10-CM

## 2015-01-20 DIAGNOSIS — Z1151 Encounter for screening for human papillomavirus (HPV): Secondary | ICD-10-CM | POA: Diagnosis present

## 2015-01-20 DIAGNOSIS — Z8249 Family history of ischemic heart disease and other diseases of the circulatory system: Secondary | ICD-10-CM | POA: Insufficient documentation

## 2015-01-20 DIAGNOSIS — Z01419 Encounter for gynecological examination (general) (routine) without abnormal findings: Secondary | ICD-10-CM | POA: Insufficient documentation

## 2015-01-20 DIAGNOSIS — Z23 Encounter for immunization: Secondary | ICD-10-CM

## 2015-01-20 DIAGNOSIS — Z0000 Encounter for general adult medical examination without abnormal findings: Secondary | ICD-10-CM

## 2015-01-20 DIAGNOSIS — Z1211 Encounter for screening for malignant neoplasm of colon: Secondary | ICD-10-CM | POA: Diagnosis not present

## 2015-01-20 DIAGNOSIS — R0789 Other chest pain: Secondary | ICD-10-CM

## 2015-01-20 DIAGNOSIS — R071 Chest pain on breathing: Secondary | ICD-10-CM

## 2015-01-20 NOTE — Assessment & Plan Note (Signed)
?   costochondritis versus sternum injury. No clear cardiac or pulmonary symptoms. NO GI reflux or epigastric pain.

## 2015-01-20 NOTE — Patient Instructions (Addendum)
Work on [employer], regular exercise and weight loss.  Stop at lab on on way out for IFOB.  We will call with X-ray results.

## 2015-01-20 NOTE — Progress Notes (Signed)
Pre visit review using our clinic review tool, if applicable. No additional management support is needed unless otherwise documented below in the visit note. 

## 2015-01-20 NOTE — Assessment & Plan Note (Signed)
Well controlled. Continue current medication. Encouraged exercise, weight loss, healthy eating habits.  

## 2015-01-20 NOTE — Addendum Note (Signed)
Addended by: Tammi Sou on: 01/20/2015 03:47 PM   Modules accepted: Orders

## 2015-01-20 NOTE — Progress Notes (Signed)
59 year old female presents for annual exam.   She has appt with Dr. Hassell Done  GI surgeon tommorow . She has been having pain in between breasts in chest wall. Feels more prominent in anterior chest wall. She is palpating area a lot. Tightness in chest when crying, upset. No chest pain with exertion.  Her mom and aunt both have aortic valve stenosis.  She wonders if she need further eval.  She did have ECHO in 2013. EF  72-53%, grade 1 diastolic dysfunction. Tricuspid aortic valve without stenosis.   She has been under a lot of stress in last few months. He husband has asked for a divorce and has told her he is gay.  She is a stress eater and has started gaining weight back.. 9 lbs in last month.  Wt Readings from Last 3 Encounters:  01/20/15 228 lb (103.42 kg)  12/25/14 226 lb 8 oz (102.74 kg)  12/23/14 228 lb (103.42 kg)   BP Readings from Last 3 Encounters:  01/20/15 128/66  12/25/14 110/70  12/23/14 128/78  BP is well controlled ondiltiazem and HCTZ for edema. No lightheadedness. Mild edema.  Using medication without problems or lightheadedness: none Chest pain with exertion: See above Short of breath:None Average home BPs: not checking. Other issues:  LDL at goal < 130 on no med. Lab Results  Component Value Date   CHOL 218* 01/13/2015   HDL 67.30 01/13/2015   LDLCALC 127* 01/13/2015   TRIG 116.0 01/13/2015   CHOLHDL 3 01/13/2015   Asthma, mild intermittant: No issues in last 1-2 years. resolved SOB with weight loss.  Body mass index is 36.82 kg/(m^2).   Review of Systems  Constitutional: Negative for fever and fatigue.  HENT: Negative for ear pain.  Eyes: Negative for pain.  Respiratory: Negative for chest tightness and shortness of breath.  Cardiovascular: Negative for chest pain, palpitations and leg swelling.  Gastrointestinal: Negative for abdominal pain.  Genitourinary: Negative for dysuria.       Objective:   Physical Exam  Constitutional:  Vital signs are normal. She appears well-developed and well-nourished. She is cooperative. Non-toxic appearance. She does not appear ill. No distress.  Obese appearing female with large pannus and weight mainly in legs  HENT:  Head: Normocephalic.  Right Ear: Hearing, tympanic membrane, external ear and ear canal normal.  Left Ear: Hearing, tympanic membrane, external ear and ear canal normal.  Nose: Nose normal.  Eyes: Conjunctivae, EOM and lids are normal. Pupils are equal, round, and reactive to light. Lids are everted and swept, no foreign bodies found.  Neck: Trachea normal and normal range of motion. Neck supple. Carotid bruit is not present. No mass and no thyromegaly present.  Cardiovascular: Normal rate, regular rhythm, S1 normal, S2 normal, normal heart sounds and intact distal pulses. Exam reveals no gallop.  No murmur heard. Pulmonary/Chest: Effort normal and breath sounds normal. No respiratory distress. She has no wheezes. She has no rhonchi. She has no rales.  Abdominal: Soft. Normal appearance and bowel sounds are normal. She exhibits no distension, no fluid wave, no abdominal bruit and no mass. There is no hepatosplenomegaly. There is no tenderness. There is no rebound, no guarding and no CVA tenderness. No hernia.  Genitourinary: Vagina normal and uterus normal. No breast swelling, tenderness, discharge or bleeding. Pelvic exam was performed with patient supine. There is no rash, tenderness or lesion on the right labia. There is no rash, tenderness or lesion on the left labia. Uterus is not  enlarged and not tender. Cervix exhibits friability. Cervix exhibits no motion tenderness and no discharge. Right adnexum displays no mass, no tenderness and no fullness. Left adnexum displays no mass, no tenderness and no fullness.  TTP over central lower sternum, no epigastric pain, also ttp at costohondral junction bilaterally. Lymphadenopathy:   She has no cervical adenopathy.   She  has no axillary adenopathy.  Neurological: She is alert. She has normal strength. No cranial nerve deficit or sensory deficit.  Skin: Skin is warm, dry and intact. No rash noted.  Psychiatric: Her speech is normal and behavior is normal. Judgment normal. Her mood appears not anxious. Cognition and memory are normal. She does not exhibit a depressed mood.          Assessment & Plan:  The patient's preventative maintenance and recommended screening tests for an annual wellness exam were reviewed in full today. Brought up to date unless services declined.  Counselled on the importance of diet, exercise, and its role in overall health and mortality. The patient's FH and SH was reviewed, including their home life, tobacco status, and drug and alcohol status.   Vaccines: td, due 2016 Colon: ifob neg 2015, now due DEXA:Not indicated until age 66 Mammo: 12/2014 Nonsmoker pap/DVE: nml 2013, 12/2013 nml but no HPV testing, given husband's infidelity will check pap again this year with HPV, annual DVE. If neg then go to q 3 year testing. Hep C screen negative 01/2015

## 2015-01-25 LAB — CYTOLOGY - PAP

## 2015-02-07 ENCOUNTER — Other Ambulatory Visit (INDEPENDENT_AMBULATORY_CARE_PROVIDER_SITE_OTHER): Payer: BC Managed Care – PPO

## 2015-02-07 ENCOUNTER — Other Ambulatory Visit: Payer: Self-pay | Admitting: Family Medicine

## 2015-02-07 DIAGNOSIS — Z1211 Encounter for screening for malignant neoplasm of colon: Secondary | ICD-10-CM | POA: Diagnosis not present

## 2015-02-07 LAB — FECAL OCCULT BLOOD, IMMUNOCHEMICAL: Fecal Occult Bld: NEGATIVE

## 2015-02-08 ENCOUNTER — Encounter: Payer: Self-pay | Admitting: *Deleted

## 2015-04-07 ENCOUNTER — Other Ambulatory Visit: Payer: Self-pay | Admitting: Family Medicine

## 2015-07-04 ENCOUNTER — Emergency Department (HOSPITAL_COMMUNITY): Payer: BC Managed Care – PPO

## 2015-07-04 ENCOUNTER — Encounter (HOSPITAL_COMMUNITY): Payer: Self-pay

## 2015-07-04 ENCOUNTER — Telehealth: Payer: Self-pay | Admitting: Family Medicine

## 2015-07-04 ENCOUNTER — Emergency Department (HOSPITAL_COMMUNITY)
Admission: EM | Admit: 2015-07-04 | Discharge: 2015-07-04 | Disposition: A | Discharge status: Home / Self Care | Payer: BC Managed Care – PPO | Attending: Emergency Medicine | Admitting: Emergency Medicine

## 2015-07-04 ENCOUNTER — Ambulatory Visit: Payer: BC Managed Care – PPO | Admitting: Internal Medicine

## 2015-07-04 DIAGNOSIS — Z7951 Long term (current) use of inhaled steroids: Secondary | ICD-10-CM | POA: Diagnosis not present

## 2015-07-04 DIAGNOSIS — K219 Gastro-esophageal reflux disease without esophagitis: Secondary | ICD-10-CM | POA: Insufficient documentation

## 2015-07-04 DIAGNOSIS — I10 Essential (primary) hypertension: Secondary | ICD-10-CM | POA: Insufficient documentation

## 2015-07-04 DIAGNOSIS — Z87828 Personal history of other (healed) physical injury and trauma: Secondary | ICD-10-CM | POA: Diagnosis not present

## 2015-07-04 DIAGNOSIS — Z79899 Other long term (current) drug therapy: Secondary | ICD-10-CM | POA: Diagnosis not present

## 2015-07-04 DIAGNOSIS — R079 Chest pain, unspecified: Secondary | ICD-10-CM | POA: Diagnosis not present

## 2015-07-04 DIAGNOSIS — J45909 Unspecified asthma, uncomplicated: Secondary | ICD-10-CM | POA: Insufficient documentation

## 2015-07-04 LAB — CBC
HCT: 40.5 % (ref 36.0–46.0)
Hemoglobin: 13.6 g/dL (ref 12.0–15.0)
MCH: 29.8 pg (ref 26.0–34.0)
MCHC: 33.6 g/dL (ref 30.0–36.0)
MCV: 88.8 fL (ref 78.0–100.0)
Platelets: 261 10*3/uL (ref 150–400)
RBC: 4.56 MIL/uL (ref 3.87–5.11)
RDW: 12.9 % (ref 11.5–15.5)
WBC: 6.7 10*3/uL (ref 4.0–10.5)

## 2015-07-04 LAB — I-STAT TROPONIN, ED
Troponin i, poc: 0.01 ng/mL (ref 0.00–0.08)
Troponin i, poc: 0 ng/mL (ref 0.00–0.08)

## 2015-07-04 LAB — BASIC METABOLIC PANEL
Anion gap: 13 (ref 5–15)
BUN: 19 mg/dL (ref 6–20)
CO2: 28 mmol/L (ref 22–32)
Calcium: 9.6 mg/dL (ref 8.9–10.3)
Chloride: 101 mmol/L (ref 101–111)
Creatinine, Ser: 0.5 mg/dL (ref 0.44–1.00)
GFR calc Af Amer: >60 mL/min (ref >60)
GFR calc non Af Amer: >60 mL/min (ref >60)
Glucose, Bld: 88 mg/dL (ref 65–99)
Potassium: 3.1 mmol/L — ABNORMAL LOW (ref 3.5–5.1)
Sodium: 142 mmol/L (ref 135–145)

## 2015-07-04 MED ORDER — NAPROXEN 500 MG PO TABS: 500.0000 mg | ORAL_TABLET | Freq: Two times a day (BID) | ORAL | Status: DC

## 2015-07-04 MED ORDER — TRAMADOL HCL 50 MG PO TABS: 50.0000 mg | ORAL_TABLET | Freq: Four times a day (QID) | ORAL | Status: DC | PRN

## 2015-07-04 NOTE — ED Notes (Signed)
Pt left ambulatory, appologized for delay and wait.

## 2015-07-04 NOTE — ED Notes (Signed)
Patient here with right sided chest pain since Friday, now having radiation to left arm, no associated symptoms. Pain worse with movement

## 2015-07-04 NOTE — Discharge Instructions (Signed)
Aspirin and Your Heart  Aspirin is a medicine that affects the way blood clots. Aspirin can be used to help reduce the risk of blood clots, heart attacks, and other heart-related problems.  SHOULD I TAKE ASPIRIN? Your health care provider will help you determine whether it is safe and beneficial for you to take aspirin daily. Taking aspirin daily may be beneficial if you:  Have had a heart attack or chest pain.  Have undergone open heart surgery such as coronary artery bypass surgery (CABG).  Have had coronary angioplasty.  Have experienced a stroke or transient ischemic attack (TIA).  Have peripheral vascular disease (PVD).  Have chronic heart rhythm problems such as atrial fibrillation. ARE THERE ANY RISKS OF TAKING ASPIRIN DAILY? Daily use of aspirin can increase your risk of side effects. Some of these include:  Bleeding. Bleeding problems can be minor or serious. An example of a minor problem is a cut that does not stop bleeding. An example of a more serious problem is stomach bleeding or bleeding into the brain. Your risk of bleeding is increased if you are also taking non-steroidal anti-inflammatory medicine (NSAIDs).  Increased bruising.  Upset stomach.  An allergic reaction. People who have nasal polyps have an increased risk of developing an aspirin allergy. WHAT ARE SOME GUIDELINES I SHOULD FOLLOW WHEN TAKING ASPIRIN?   Take aspirin only as directed by your health care provider. Make sure you understand how much you should take and what form you should take. The two forms of aspirin are:  Non-enteric-coated. This type of aspirin does not have a coating and is absorbed quickly. Non-enteric-coated aspirin is usually recommended for people with chest pain. This type of aspirin also comes in a chewable form.  Enteric-coated. This type of aspirin has a special coating that releases the medicine very slowly. Enteric-coated aspirin causes less stomach upset than non-enteric-coated  aspirin. This type of aspirin should not be chewed or crushed.  Drink alcohol in moderation. Drinking alcohol increases your risk of bleeding. WHEN SHOULD I SEEK MEDICAL CARE?   You have unusual bleeding or bruising.  You have stomach pain.  You have an allergic reaction. Symptoms of an allergic reaction include:  Hives.  Itchy skin.  Swelling of the lips, tongue, or face.  You have ringing in your ears. WHEN SHOULD I SEEK IMMEDIATE MEDICAL CARE?   Your bowel movements are bloody, dark red, or black in color.  You vomit or cough up blood.  You have blood in your urine.  You cough, wheeze, or feel short of breath. If you have any of the following symptoms, this is an emergency. Do not wait to see if the pain will go away. Get medical help at once. Call your local emergency services (911 in the [place] Do not drive yourself to the hospital.  You have severe chest pain, especially if the pain is crushing or pressure-like and spreads to the arms, back, neck, or jaw.  You have stroke-like symptoms, such as:   Loss of vision.   Difficulty talking.   Numbness or weakness on one side of your body.   Numbness or weakness in your arm or leg.   Not thinking clearly or feeling confused.    This information is not intended to replace advice given to you by your health care provider. Make sure you discuss any questions you have with your health care provider.    Make an appointment to follow-up with your cardiologist. Return for any new or worse  symptoms. Today's workup without significant findings.  Trial of Naprosyn for the left arm pain and may help the right chest pain as well. Can take tramadol with it.   Document Released: 05/10/2008 Document Revised: 06/18/2014 Document Reviewed: 09/02/2013 Elsevier Interactive Patient Education [employer].

## 2015-07-04 NOTE — ED Provider Notes (Signed)
CSN: [id number]     Arrival date & time 07/04/15  1056 History   First MD Initiated Contact with Patient 07/04/15 1550     Chief Complaint  Patient presents with  . Chest Pain     (Consider location/radiation/quality/duration/timing/severity/associated sxs/prior Treatment) Patient is a 60 y.o. female presenting with chest pain. The history is provided by the patient.  Chest Pain Associated symptoms: no back pain, no fever, no headache, no nausea, no shortness of breath and not vomiting    patient with constant left arm pain since Friday. Patient states no increase in the pain with movement of the left arm. Today at 9:30 in the morning started with constant right anterior chest pain. That is not made worse by breathing or movement. No shortness of breath no nausea no vomiting. Pain described on the right side of the chest is a stabbing constant pain. No history of any cardiac problems other than the history of supraventricular tachycardia. Followed by cardiologist Dr. Lovena Le.  Past Medical History  Diagnosis Date  . ASTHMA, PERSISTENT, MODERATE 04/03/2007  . ALLERGIC RHINITIS 04/03/2007  . Hypertension   . Morbid obesity ([place])   . Paroxysmal SVT (supraventricular tachycardia) ([address])     a. 11/2011 48h Holter: RSR, rare PVC's, occas PAC's  . Dyspnea on exertion     a. 11/2007 Echo: EF 60%.  . Joint pain     HIPS / LEGS  . Knee injury     RT  . GERD (gastroesophageal reflux disease)   . 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 Ventral hernia    Past Surgical History  Procedure Laterality Date  . Cholecystectomy  1995  . Appendectomy  1996  . Ovarian cyst removal  1970  . Ventral hernia repair  2000  . Foot surgery  1995 / 1996    x2  . Pilonidal cyst excision  1975  . Skin cancer excision    . Tonsillectomy    . Cesarean section      x2  . US echocardiography  12/2011    WNL - EF 55-60%, mild MR, grade 1 diastolic dysnfiction (mild)  . Laparoscopic gastric sleeve resection N/A 06/08/2013     Procedure: LAPAROSCOPIC GASTRIC SLEEVE RESECTION AND EXCISION OF SEBACEUS CYST FROM MID CHEST takedown of incarcerated ventral hernia and primary repair endoscopy;  Surgeon: [name], MD;  Location: [facility];  Service: General;  Laterality: N/A;  . Upper gi endoscopy  06/08/2013    Procedure: UPPER GI ENDOSCOPY;  Surgeon: [name], MD;  Location: [facility];  Service: General;;  . Knee arthroscopy w/ meniscal repair  07/25/13    right knee Dr. Mardelle Matte  . Moles  06/2013    removed 2 moles from under arm and  lowback   Family History  Problem Relation Age of Onset  . Diabetes    . Hypertension    . Stroke    . Hemophilia    . Breast cancer Mother   . Diabetes Mother   . Hypertension Mother   . Diabetes Father   . Hypertension Father   . Kidney failure Mother    Social History  Substance Use Topics  . Smoking status: Never Smoker   . Smokeless tobacco: Never Used     Comment: smoke at age 60-12  . Alcohol Use: No   OB History    Gravida Para Term Preterm AB TAB SAB Ectopic Multiple Living   3 2 2  1  1    2  Review of Systems  Constitutional: Negative for fever.  HENT: Negative for congestion.   Eyes: Negative for redness.  Respiratory: Negative for shortness of breath.   Cardiovascular: Positive for chest pain.  Gastrointestinal: Negative for nausea and vomiting.  Genitourinary: Negative for dysuria.  Musculoskeletal: Negative for back pain.  Skin: Negative for rash.  Neurological: Negative for headaches.  Hematological: Does not bruise/bleed easily.  Psychiatric/Behavioral: Negative for confusion.      Allergies  Review of patient's allergies indicates no known allergies.  Home Medications   Prior to Admission medications   Medication Sig Start Date End Date Taking? Authorizing Provider  Calcium Citrate-Vitamin D (CITRACAL + D PO) Take 1 tablet by mouth daily.    Historical Provider, MD  Cyanocobalamin (VITAMIN B-12 SL) Place 1 tablet under the tongue  every morning.    Historical Provider, MD  diltiazem (CARDIZEM CD) 180 MG 24 hr capsule TAKE 1 CAPSULE (180 MG TOTAL) BY MOUTH DAILY. 04/07/15   Amy Cletis [place], MD  fluticasone (FLONASE) 50 MCG/ACT nasal spray Place 2 sprays into both nostrils daily. 07/30/14   Tonia [place], MD  hydrochlorothiazide (HYDRODIURIL) 25 MG tablet TAKE 1 TABLET BY MOUTH EVERY DAY 02/07/15   Amy Cletis [place], MD  Misc Natural Products (OSTEO BI-FLEX TRIPLE STRENGTH) TABS Take 2 tablets by mouth every morning.    Historical Provider, MD  Multiple Vitamins-Minerals (MULTIVITAMIN WITH MINERALS) tablet Take 1 tablet by mouth daily.    Historical Provider, MD  naproxen (NAPROSYN) 500 MG tablet Take 1 tablet (500 mg total) by mouth 2 (two) times daily. 07/04/15   Fredia Sorrow, MD  Omega-3 Fatty Acids (FISH OIL PO) Take 1 capsule by mouth daily.    Historical Provider, MD  traMADol (ULTRAM) 50 MG tablet Take 1 tablet (50 mg total) by mouth every 6 (six) hours as needed. 07/04/15   Fredia Sorrow, MD   BP 146/86 mmHg  Pulse 73  Temp(Src) 97.8 F (36.6 C) (Oral)  Resp 16  Ht 5\' 7"  (1.702 m)  Wt 101.152 kg  BMI 34.92 kg/m2  SpO2 99% Physical Exam  Constitutional: She is oriented to person, place, and time. She appears well-developed and well-nourished. No distress.  HENT:  Head: Normocephalic and atraumatic.  Mouth/Throat: Oropharynx is clear and moist.  Eyes: Conjunctivae and EOM are normal. Pupils are equal, round, and reactive to light.  Neck: Normal range of motion. Neck supple.  Cardiovascular: Normal rate, regular rhythm and normal heart sounds.   No murmur heard. Pulmonary/Chest: Effort normal and breath sounds normal.  No reproducible chest tenderness on the right.  Abdominal: Soft. Bowel sounds are normal. There is no tenderness.  Musculoskeletal: She exhibits no edema.  Some mild discomfort with movement of the left arm. Radial pulses 2+.  Neurological: She is alert and oriented to person, place, and time.  No cranial nerve deficit. She exhibits normal muscle tone. Coordination normal.  Skin: Skin is warm.  Nursing note and vitals reviewed.   ED Course  Procedures (including critical care time) Labs Review Labs Reviewed  BASIC METABOLIC PANEL - Abnormal; Notable for the following:    Potassium 3.1 (*)    All other components within normal limits  CBC  I-STAT TROPOININ, ED  I-STAT TROPOININ, ED   Results for orders placed or performed during the hospital encounter of 0000000  Basic metabolic panel  Result Value Ref Range   Sodium 142 135 - 145 mmol/L   Potassium 3.1 (L) 3.5 - 5.1 mmol/L  Chloride 101 101 - 111 mmol/L   CO2 28 22 - 32 mmol/L   Glucose, Bld 88 65 - 99 mg/dL   BUN 19 6 - 20 mg/dL   Creatinine, Ser 0.50 0.44 - 1.00 mg/dL   Calcium 9.6 8.9 - 10.3 mg/dL   GFR calc non Af Amer >60 >60 mL/min   GFR calc Af Amer >60 >60 mL/min   Anion gap 13 5 - 15  CBC  Result Value Ref Range   WBC 6.7 4.0 - 10.5 K/uL   RBC 4.56 3.87 - 5.11 MIL/uL   Hemoglobin 13.6 12.0 - 15.0 g/dL   HCT 40.5 36.0 - 46.0 %   MCV 88.8 78.0 - 100.0 fL   MCH 29.8 26.0 - 34.0 pg   MCHC 33.6 30.0 - 36.0 g/dL   RDW 12.9 11.5 - 15.5 %   Platelets 261 150 - 400 K/uL  I-stat troponin, ED (not at [facility])  Result Value Ref Range   Troponin i, poc 0.01 0.00 - 0.08 ng/mL   Comment 3          I-Stat Troponin, ED (not at [facility])  Result Value Ref Range   Troponin i, poc 0.00 0.00 - 0.08 ng/mL   Comment 3             Imaging Review Dg Chest 2 View  07/04/2015  CLINICAL DATA:  Onset right side chest pain. Severe left arm pain. No known injury. EXAM: CHEST  2 VIEW COMPARISON:  01/20/2015 FINDINGS: The heart size and mediastinal contours are within normal limits. Both lungs are clear. The visualized skeletal structures are unremarkable. IMPRESSION: No active cardiopulmonary disease. Electronically Signed   By: Rolm Baptise M.D.   On: 07/04/2015 12:24   I have personally reviewed and evaluated these images  and lab results as part of my medical decision-making.   EKG Interpretation   Date/Time:  Monday July 04 2015 11:07:05 EST Ventricular Rate:  67 PR Interval:  174 QRS Duration: 100 QT Interval:  406 QTC Calculation: 429 R Axis:   -7 Text Interpretation:  Normal sinus rhythm with sinus arrhythmia Low  voltage QRS Incomplete right bundle branch block Cannot rule out Anterior  infarct , age undetermined Abnormal ECG Artifact Confirmed by Diahn Waidelich   MD, Burke Terry [phone]) on 07/04/2015 3:52:30 PM      MDM   Final diagnoses:  Chest pain, unspecified chest pain type   Patient with complaint of left arm pain that has been constant since Friday. Today at around 9:30 in the morning developed right anterior chest pain. That's also been constant. Described as sharp. Contacted primary care doctor recommended to come in for evaluation. initial troponin done at around 11:15 and then repeat troponin done here recently both negative. Unlikely that this is cardiac chest pain. Patient does have cardiology to follow-up with for history of supraventricular tachycardias. Today's chest x-rays negative for pneumonia pneumothorax or pulmonary edema. EKG without acute changes. Patient's left arm pain is increased slightly with movement suggesting this may be musculoskeletal in nature. No reproducible right anterior chest pain.  Patient will be discharged home with cardiology follow-up she'll return for any new or worse symptoms.    Fredia Sorrow, MD 07/04/15 [phone]

## 2015-07-04 NOTE — Telephone Encounter (Signed)
Per chart review pt is at  now. 

## 2015-07-04 NOTE — Telephone Encounter (Signed)
Patient Name: Kimberly Patton  DOB: 25-Jan-1956    Initial Comment Caller states been having strange feelings in her left arm and right breast, 155/76 bp last night   Nurse Assessment  Nurse: Leilani Merl, RN, Heather Date/Time (Eastern Time): 07/04/2015 10:32:13 AM  Confirm and document reason for call. If symptomatic, describe symptoms. You must click the next button to save text entered. ---Caller states been having strange feelings in her left arm since Friday and right breast, 155/76 BP last night  Has the patient traveled out of the country within the last 30 days? ---No  Does the patient have any new or worsening symptoms? ---Yes  Will a triage be completed? ---Yes  Related visit to physician within the last 2 weeks? ---No  Does the PT have any chronic conditions? (i.e. diabetes, asthma, etc.) ---Yes  List chronic conditions. ---see mr  Is this a behavioral health or substance abuse call? ---No     Guidelines    Guideline Title Affirmed Question Affirmed Notes  Arm Pain [1] Age > 40 AND [2] associated chest or jaw pain AND [3] pain lasts > 5 minutes    Final Disposition User   Go to ED Now [place], RN, [facility] - ED   Disagree/Comply: Comply

## 2015-07-05 NOTE — Telephone Encounter (Signed)
Noted  

## 2015-08-07 ENCOUNTER — Other Ambulatory Visit: Payer: Self-pay | Admitting: Family Medicine

## 2015-10-09 ENCOUNTER — Other Ambulatory Visit: Payer: Self-pay | Admitting: Family Medicine

## 2016-01-03 ENCOUNTER — Other Ambulatory Visit: Payer: Self-pay | Admitting: Family Medicine

## 2016-01-03 DIAGNOSIS — Z1231 Encounter for screening mammogram for malignant neoplasm of breast: Secondary | ICD-10-CM

## 2016-01-04 ENCOUNTER — Ambulatory Visit
Admission: RE | Admit: 2016-01-04 | Discharge: 2016-01-04 | Disposition: A | Discharge status: Home / Self Care | Payer: BC Managed Care – PPO | Source: Ambulatory Visit | Attending: Family Medicine | Admitting: Family Medicine

## 2016-01-04 DIAGNOSIS — Z1231 Encounter for screening mammogram for malignant neoplasm of breast: Secondary | ICD-10-CM

## 2016-01-10 DIAGNOSIS — H33311 Horseshoe tear of retina without detachment, right eye: Secondary | ICD-10-CM

## 2016-01-10 HISTORY — DX: Horseshoe tear of retina without detachment, right eye: H33.311

## 2016-01-10 HISTORY — PX: EYE SURGERY: SHX253

## 2016-01-10 HISTORY — PX: RETINAL TEAR REPAIR CRYOTHERAPY: SHX5304

## 2016-01-16 ENCOUNTER — Encounter: Payer: Self-pay | Admitting: Family Medicine

## 2016-01-16 ENCOUNTER — Ambulatory Visit (INDEPENDENT_AMBULATORY_CARE_PROVIDER_SITE_OTHER): Payer: BC Managed Care – PPO | Admitting: Family Medicine

## 2016-01-16 VITALS — BP 126/84 | HR 72 | Temp 98.1°F | Wt 230.8 lb

## 2016-01-16 DIAGNOSIS — K219 Gastro-esophageal reflux disease without esophagitis: Secondary | ICD-10-CM

## 2016-01-16 DIAGNOSIS — R0989 Other specified symptoms and signs involving the circulatory and respiratory systems: Secondary | ICD-10-CM

## 2016-01-16 DIAGNOSIS — R42 Dizziness and giddiness: Secondary | ICD-10-CM

## 2016-01-16 LAB — CBC WITH DIFFERENTIAL/PLATELET
Basophils Absolute: 0 10*3/uL (ref 0.0–0.1)
Basophils Relative: 0.7 % (ref 0.0–3.0)
Eosinophils Absolute: 0.1 10*3/uL (ref 0.0–0.7)
Eosinophils Relative: 2.2 % (ref 0.0–5.0)
HCT: 39.2 % (ref 36.0–46.0)
Hemoglobin: 13.3 g/dL (ref 12.0–15.0)
Lymphocytes Relative: 32.5 % (ref 12.0–46.0)
Lymphs Abs: 2 10*3/uL (ref 0.7–4.0)
MCHC: 33.8 g/dL (ref 30.0–36.0)
MCV: 87.1 fl (ref 78.0–100.0)
Monocytes Absolute: 0.3 10*3/uL (ref 0.1–1.0)
Monocytes Relative: 5.4 % (ref 3.0–12.0)
Neutro Abs: 3.7 10*3/uL (ref 1.4–7.7)
Neutrophils Relative %: 59.2 % (ref 43.0–77.0)
Platelets: 285 10*3/uL (ref 150.0–400.0)
RBC: 4.5 Mil/uL (ref 3.87–5.11)
RDW: 13 % (ref 11.5–15.5)
WBC: 6.2 10*3/uL (ref 4.0–10.5)

## 2016-01-16 LAB — COMPREHENSIVE METABOLIC PANEL
ALT: 12 U/L (ref 0–35)
AST: 14 U/L (ref 0–37)
Albumin: 3.9 g/dL (ref 3.5–5.2)
Alkaline Phosphatase: 90 U/L (ref 39–117)
BUN: 19 mg/dL (ref 6–23)
CO2: 33 mEq/L — ABNORMAL HIGH (ref 19–32)
Calcium: 9.7 mg/dL (ref 8.4–10.5)
Chloride: 103 mEq/L (ref 96–112)
Creatinine, Ser: 0.55 mg/dL (ref 0.40–1.20)
GFR: 119.74 mL/min (ref >60.00)
Glucose, Bld: 108 mg/dL — ABNORMAL HIGH (ref 70–99)
Potassium: 3.7 mEq/L (ref 3.5–5.1)
Sodium: 141 mEq/L (ref 135–145)
Total Bilirubin: 0.5 mg/dL (ref 0.2–1.2)
Total Protein: 7 g/dL (ref 6.0–8.3)

## 2016-01-16 LAB — TSH: TSH: 0.89 u[IU]/mL (ref 0.35–4.50)

## 2016-01-16 MED ORDER — OMEPRAZOLE 40 MG PO CPDR: 40.0000 mg | DELAYED_RELEASE_CAPSULE | Freq: Every day | ORAL | 1 refills | Status: DC

## 2016-01-16 NOTE — Assessment & Plan Note (Signed)
?  vit D related given temporal relationship. Start omeprazole 40mg  daily x 3 wks then PRN. Hold vit D until GERD improved.

## 2016-01-16 NOTE — Patient Instructions (Addendum)
Stop vitamin D. Start omeprazole 40mg  daily for 3 weeks then as needed. Then may restart vitamin D 1000 units daily. Labs today Pass by our referral coordinator to schedule carotid ultrasound.  If not improving with above, let us know for further evaluation.

## 2016-01-16 NOTE — Assessment & Plan Note (Addendum)
Anticipate peripheral cause. nonfocal neurological exam.  With new R bruit - check carotid US.  Check labs to r/o reversible causes of dizziness.

## 2016-01-16 NOTE — Progress Notes (Signed)
Pre visit review using our clinic review tool, if applicable. No additional management support is needed unless otherwise documented below in the visit note. 

## 2016-01-16 NOTE — Progress Notes (Addendum)
BP 126/84 (BP Location: Right Arm, Cuff Size: Large)   Pulse 72   Temp 98.1 F (36.7 C) (Oral)   Wt 230 lb 12 oz (104.7 kg)   BMI 36.14 kg/m    CC: not feeling well Subjective:    Patient ID: Kimberly Patton, female    DOB: 12/14/1955, 60 y.o.   MRN: [id number]  HPI: Kimberly Patton is a 60 y.o. female presenting on 01/16/2016 for Gastroesophageal Reflux (wonders if vitamin d is causing it) and Chest Pain (left side)   2 wk h/o severe heartburn associated with tachypalpitations. Now noticing increased dizziness/lightheadedness with prolonged standing. Also noticing some frontal head pressure. Dizziness described as room spinning sensation - vertigo. Actually felt well on Saturday - went swimming and did well. No nausea, presyncope, tinnitus. Denies unilateral weakness. No dyspnea with exertion. Endorses some left chest pulling but no tightness/pressure or pain. No tinnitus, no hearing loss.  Protein shakes help symptoms a lot.   She has recently started taking vitamin D3 10,000 IU daily - wonders if this is related. 3d ago she stopped taking vit D3.  09/2015 - she had a fall with rib fracture.  H/o gastric sleeve 3 yrs ago - has lost 150 lbs.  Denies h/o GERD in the past.  Increased stress recently. Last year found out husband was gay.   Relevant past medical, surgical, family and social history reviewed and updated as indicated. Interim medical history since our last visit reviewed. Allergies and medications reviewed and updated. Current Outpatient Prescriptions on File Prior to Visit  Medication Sig  . Calcium Citrate-Vitamin D (CITRACAL + D PO) Take 1 tablet by mouth daily.  . Cyanocobalamin (VITAMIN B-12 SL) Place 1 tablet under the tongue every morning.  . diltiazem (CARDIZEM CD) 180 MG 24 hr capsule TAKE 1 CAPSULE (180 MG TOTAL) BY MOUTH DAILY.  . hydrochlorothiazide (HYDRODIURIL) 25 MG tablet TAKE 1 TABLET BY MOUTH EVERY DAY  . Multiple Vitamins-Minerals (MULTIVITAMIN WITH  MINERALS) tablet Take 1 tablet by mouth daily.  . Omega-3 Fatty Acids (FISH OIL PO) Take 1 capsule by mouth daily.  . fluticasone (FLONASE) 50 MCG/ACT nasal spray Place 2 sprays into both nostrils daily. (Patient not taking: Reported on 01/16/2016)  . Misc Natural Products (OSTEO BI-FLEX TRIPLE STRENGTH) TABS Take 2 tablets by mouth every morning.  . naproxen (NAPROSYN) 500 MG tablet Take 1 tablet (500 mg total) by mouth 2 (two) times daily. (Patient not taking: Reported on 01/16/2016)  . traMADol (ULTRAM) 50 MG tablet Take 1 tablet (50 mg total) by mouth every 6 (six) hours as needed. (Patient not taking: Reported on 01/16/2016)   No current facility-administered medications on file prior to visit.     Review of Systems Per HPI unless specifically indicated in ROS section     Objective:    BP 126/84 (BP Location: Right Arm, Cuff Size: Large)   Pulse 72   Temp 98.1 F (36.7 C) (Oral)   Wt 230 lb 12 oz (104.7 kg)   BMI 36.14 kg/m   Wt Readings from Last 3 Encounters:  01/16/16 230 lb 12 oz (104.7 kg)  07/04/15 223 lb (101.2 kg)  01/20/15 228 lb (103.4 kg)    Physical Exam  Constitutional: She is oriented to person, place, and time. She appears well-developed and well-nourished. No distress.  HENT:  Mouth/Throat: Oropharynx is clear and moist. No oropharyngeal exudate.  Eyes: Conjunctivae and EOM are normal. Pupils are equal, round, and reactive to light. No  scleral icterus.  Neck: Normal range of motion. Neck supple. Carotid bruit is present (R).  Cardiovascular: Normal rate, regular rhythm, normal heart sounds and intact distal pulses.   No murmur heard. Pulmonary/Chest: Effort normal and breath sounds normal. No respiratory distress. She has no wheezes. She has no rales.  Lymphadenopathy:    She has no cervical adenopathy.  Neurological: She is alert and oriented to person, place, and time. She has normal strength. No cranial nerve deficit or sensory deficit. Coordination and gait  normal.  CN 2-12 intact FTN intact EOMI Neg dix hallpike, vertigo without nystagmus with sitting up from supine position Normal HINTS  Nursing note and vitals reviewed.     Assessment & Plan:   Problem List Items Addressed This Visit    Esophageal reflux    ?vit D related given temporal relationship. Start omeprazole 40mg  daily x 3 wks then PRN. Hold vit D until GERD improved.       Relevant Medications   omeprazole (PRILOSEC) 40 MG capsule   Vertigo - Primary    Anticipate peripheral cause. nonfocal neurological exam.  With new R bruit - check carotid US.  Check labs to r/o reversible causes of dizziness.       Relevant Orders   TSH   Comprehensive metabolic panel   CBC with Differential/Platelet    Other Visit Diagnoses    Right carotid bruit       Relevant Orders   VAS US CAROTID       Follow up plan: Return if symptoms worsen or fail to improve.  Ria Bush, MD

## 2016-01-17 ENCOUNTER — Telehealth: Payer: Self-pay

## 2016-01-17 ENCOUNTER — Ambulatory Visit (INDEPENDENT_AMBULATORY_CARE_PROVIDER_SITE_OTHER)
Admission: RE | Admit: 2016-01-17 | Discharge: 2016-01-17 | Disposition: A | Discharge status: Home / Self Care | Payer: BC Managed Care – PPO | Source: Ambulatory Visit | Attending: Family Medicine | Admitting: Family Medicine

## 2016-01-17 DIAGNOSIS — R42 Dizziness and giddiness: Secondary | ICD-10-CM

## 2016-01-17 NOTE — Telephone Encounter (Signed)
Pt left v/m; pt was seen 01/16/16 by Dr Darnell Level; today the dizziness has worsened; pt having difficulty keeping eyes focused and lips are tingling. Pt request cb.

## 2016-01-17 NOTE — Telephone Encounter (Signed)
Carotid U/S scheduled for 01/23/16. Patient scheduled for CT head today and she is aware of this per [facility].

## 2016-01-17 NOTE — Telephone Encounter (Signed)
When is carotid US scheduled? Recommend CT head today to further evaluate symptoms. Ordered. Have someone drive her to appt.

## 2016-01-17 NOTE — Telephone Encounter (Signed)
Report in Epic. Pt is waiting.

## 2016-01-17 NOTE — Telephone Encounter (Signed)
Spoke with patient. See result note. Will touch base with PCP

## 2016-01-18 ENCOUNTER — Other Ambulatory Visit: Payer: Self-pay | Admitting: Family Medicine

## 2016-01-18 ENCOUNTER — Telehealth: Payer: Self-pay | Admitting: Family Medicine

## 2016-01-18 DIAGNOSIS — R42 Dizziness and giddiness: Secondary | ICD-10-CM

## 2016-01-18 DIAGNOSIS — D329 Benign neoplasm of meninges, unspecified: Secondary | ICD-10-CM | POA: Insufficient documentation

## 2016-01-18 DIAGNOSIS — R0989 Other specified symptoms and signs involving the circulatory and respiratory systems: Secondary | ICD-10-CM

## 2016-01-18 MED ORDER — ATORVASTATIN CALCIUM 10 MG PO TABS: 10.0000 mg | ORAL_TABLET | Freq: Every day | ORAL | 3 refills | Status: DC

## 2016-01-18 MED ORDER — ASPIRIN EC 81 MG PO TBEC: 81.0000 mg | DELAYED_RELEASE_TABLET | Freq: Every day | ORAL | Status: DC

## 2016-01-18 MED ORDER — MECLIZINE HCL 25 MG PO TABS: 12.5000 mg | ORAL_TABLET | Freq: Three times a day (TID) | ORAL | 0 refills | Status: DC | PRN

## 2016-01-18 NOTE — Telephone Encounter (Signed)
Pt  Needs to have referral to neurology per dr Darnell Level. Please call cell number [phone] Thanks

## 2016-01-18 NOTE — Telephone Encounter (Signed)
Touched base with patient. Will order MRI given progressively worsening vertigo, then consider neuro vs ENT referral. rec meclizine for sxs. rec start lipitor /aspirin given bruit heard on exam.

## 2016-01-18 NOTE — Telephone Encounter (Signed)
neurology referral placed. See CT result note.

## 2016-01-18 NOTE — Telephone Encounter (Signed)
Called the patient to schedule the Neurology consult. She is confused about the CT results and would like to speak to you again about them. She knows she has had an indent in her skull but was confused if the indent is the meningioma that was seen? She was also wondering if the CT results will show up in [place]? Please call patient at [phone] Told patient today is your half day and you most likely will call her tomorrow.

## 2016-01-19 ENCOUNTER — Telehealth: Payer: Self-pay | Admitting: Family Medicine

## 2016-01-19 NOTE — Addendum Note (Signed)
Addended by: Ria Bush on: 01/19/2016 09:47 AM   Modules accepted: Orders

## 2016-01-19 NOTE — Telephone Encounter (Signed)
Patient went yesterday for an eye exam and Dr Karren Burly found a tear in her right eye and they sent her to see Dr Zadie Rhine yesterday. Dr Zadie Rhine did emergency retina surgery to fix the tear in her right eye. Since the repair she is having less dizziness.She just wanted you to be aware of what the Opthy did on her yesterday. The Opthy Dr also doesn't think that her eye issue has anything to do with her dizziness.

## 2016-01-21 ENCOUNTER — Ambulatory Visit
Admission: RE | Admit: 2016-01-21 | Discharge: 2016-01-21 | Disposition: A | Discharge status: Home / Self Care | Payer: BC Managed Care – PPO | Source: Ambulatory Visit | Attending: Family Medicine | Admitting: Family Medicine

## 2016-01-21 ENCOUNTER — Other Ambulatory Visit: Payer: Self-pay

## 2016-01-21 DIAGNOSIS — D329 Benign neoplasm of meninges, unspecified: Secondary | ICD-10-CM

## 2016-01-21 DIAGNOSIS — R42 Dizziness and giddiness: Secondary | ICD-10-CM

## 2016-01-21 MED ORDER — GADOBENATE DIMEGLUMINE 529 MG/ML IV SOLN
20.0000 mL | Freq: Once | INTRAVENOUS | Status: AC | PRN
  Administered 2016-01-21: 20 mL via INTRAVENOUS

## 2016-01-22 ENCOUNTER — Encounter: Payer: Self-pay | Admitting: Family Medicine

## 2016-01-23 ENCOUNTER — Ambulatory Visit: Payer: BC Managed Care – PPO

## 2016-01-23 ENCOUNTER — Telehealth: Payer: Self-pay | Admitting: Family Medicine

## 2016-01-23 DIAGNOSIS — R42 Dizziness and giddiness: Secondary | ICD-10-CM

## 2016-01-23 DIAGNOSIS — R0989 Other specified symptoms and signs involving the circulatory and respiratory systems: Secondary | ICD-10-CM | POA: Diagnosis not present

## 2016-01-23 NOTE — Telephone Encounter (Signed)
Spoke with patient - she states carotid US was also normal. Advised ok to stop lipitor. Ok to continue meclizine for now. Will refer to ENT for further evaluation of vertigo.

## 2016-01-23 NOTE — Telephone Encounter (Signed)
Reviewed and agreed

## 2016-01-23 NOTE — Telephone Encounter (Signed)
Patient said she has had several tests done and they have come back normal.  Patient said she's still dizzy and she has nausea.  Patient was told by Dr.Gutierrez to call an ENT for an appointment.  Patient called to schedule an appointment and she was told she needs a referral because she is dizzy.  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 Patient would like to go to [place].  Patient was put on a statin.  She's been taking it for about a week and wants to know if she still needs to take the statin since the tests were negative.

## 2016-01-23 NOTE — Telephone Encounter (Signed)
Spoke with patient. ENT referral placed.

## 2016-01-26 ENCOUNTER — Encounter: Payer: Self-pay | Admitting: Family Medicine

## 2016-01-26 NOTE — Telephone Encounter (Signed)
Please see Mychart message.

## 2016-01-27 ENCOUNTER — Ambulatory Visit (INDEPENDENT_AMBULATORY_CARE_PROVIDER_SITE_OTHER): Payer: BC Managed Care – PPO | Admitting: Family Medicine

## 2016-01-27 ENCOUNTER — Encounter: Payer: Self-pay | Admitting: Family Medicine

## 2016-01-27 DIAGNOSIS — R202 Paresthesia of skin: Secondary | ICD-10-CM | POA: Diagnosis not present

## 2016-01-27 DIAGNOSIS — R42 Dizziness and giddiness: Secondary | ICD-10-CM | POA: Diagnosis not present

## 2016-01-27 DIAGNOSIS — R200 Anesthesia of skin: Secondary | ICD-10-CM | POA: Insufficient documentation

## 2016-01-27 NOTE — Progress Notes (Signed)
Subjective:    Patient ID: Kimberly Patton, female    DOB: 1956/03/31, 60 y.o.   MRN: [id number]  HPI   60 year old female presents for follow up vertigo.  She was seen on  8/7 with this history: 2 wk h/o severe heartburn associated with tachypalpitations. Now noticing increased dizziness/lightheadedness with prolonged standing. Also noticing some frontal head pressure. Dizziness described as room spinning sensation - vertigo. Actually felt well on Saturday - went swimming and did well. No nausea, presyncope, tinnitus. Denies unilateral weakness. No dyspnea with exertion. Endorses some left chest pulling but no tightness/pressure or pain. No tinnitus, no hearing loss.  Per Dr.G's exam: Neurological: She is alert and oriented to person, place, and time. She has normal strength. No cranial nerve deficit or sensory deficit. Coordination and gait normal.  CN 2-12 intact FTN intact EOMI Neg dix hallpike, vertigo without nystagmus with sitting up from supine position Normal HINTS   Carotid dopplers negative nml cbc, CMET and TSH  Given worsening symptoms: Head CT: IMPRESSION: 1. No acute intracranial abnormalities. 2. 2.1 cm left parasagittal meningioma indents the posterior left frontal lobe at the level of the precentral gyrus. No underlying brain edema.  MRI brain: IMPRESSION: 1. 1.9 x 1.8 x 1.7 cm meningioma at the left frontoparietal convexity. No associated edema. 2. Otherwise normal brain MRI for patient age. 3. 2.6 x 1.8 cm lobulated nonenhancing lesion at the left frontoparietal scalp, suspected to reflect a sebaceous cyst. Correlation with physical exam recommended.  Dx with torn retina at opthamology Emergent laser treatment done. During procedure with dilation: worsened vertigo Vertigo went away, but other symptoms remained.  Referred to ENT: Dr. Lucia Gaskins. Per pt felt not inner ear issue  TODAY she reports: Vertigo has resolved now, but still feeling fuzzy  headed. Still tingling behind ears.  Tingling and numbness in left face. 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  No falls, no injuries.   Review of Systems  Constitutional: Negative for fatigue.  HENT: Negative for ear pain.   Respiratory: Negative for cough and shortness of breath.   Cardiovascular: Negative for chest pain.       Objective:   Physical Exam  Constitutional: She is oriented to person, place, and time. Vital signs are normal. She appears well-developed and well-nourished. She is cooperative.  Non-toxic appearance. She does not appear ill. No distress.  HENT:  Head: Normocephalic.  Right Ear: Hearing, tympanic membrane, external ear and ear canal normal. Tympanic membrane is not erythematous, not retracted and not bulging.  Left Ear: Hearing, tympanic membrane, external ear and ear canal normal. Tympanic membrane is not erythematous, not retracted and not bulging.  Nose: No mucosal edema or rhinorrhea. Right sinus exhibits no maxillary sinus tenderness and no frontal sinus tenderness. Left sinus exhibits no maxillary sinus tenderness and no frontal sinus tenderness.  Mouth/Throat: Uvula is midline, oropharynx is clear and moist and mucous membranes are normal.  Eyes: Conjunctivae, EOM and lids are normal. Pupils are equal, round, and reactive to light. Lids are everted and swept, no foreign bodies found.  Neck: Trachea normal and normal range of motion. Neck supple. Carotid bruit is not present. No thyroid mass and no thyromegaly present.  Cardiovascular: Normal rate, regular rhythm, S1 normal, S2 normal, normal heart sounds, intact distal pulses and normal pulses.  Exam reveals no gallop and no  friction rub.   No murmur heard. Pulmonary/Chest: Effort normal and breath sounds normal. No tachypnea. No respiratory distress. She has no decreased breath  sounds. She has no wheezes. She has no rhonchi. She has no rales.  Abdominal: Soft. Normal appearance and bowel sounds are normal. There is no tenderness.  Neurological: She is alert and oriented to person, place, and time. She has normal strength and normal reflexes. No cranial nerve deficit or sensory deficit. She exhibits normal muscle tone. She displays a negative Romberg sign. Coordination and gait normal. GCS eye subscore is 4. GCS verbal subscore is 5. GCS motor subscore is 6.  Nml cerebellar exam   No papilledema   When tipping head back feel " woozy" no vertigo triggered with dix hallpike  no nystagmus  Skin: Skin is warm, dry and intact. No rash noted.  Psychiatric: She has a normal mood and affect. Her speech is normal and behavior is normal. Judgment and thought content normal. Her mood appears not anxious. Cognition and memory are normal. Cognition and memory are not impaired. She does not exhibit a depressed mood. She exhibits normal recent memory and normal remote memory.          Assessment & Plan:

## 2016-01-27 NOTE — Progress Notes (Signed)
Pre visit review using our clinic review tool, if applicable. No additional management support is needed unless otherwise documented below in the visit note. 

## 2016-01-27 NOTE — Patient Instructions (Addendum)
Stop at front desk for neurology referral. Call if vertigo returns.

## 2016-01-27 NOTE — Assessment & Plan Note (Signed)
Unclear cause. Unclear if nerve compression  ? rotational vertebrobasilar insufficiency or bow hunter syndrome: if not continuing into improve consider eval of posterior circulation with MRA.  Given improving will follow and refer to neuro for further eval.

## 2016-01-27 NOTE — Assessment & Plan Note (Addendum)
Eval so far has been negative. Nml labs, nml carotids, no sign of CVA on MRI. Vertigo is resolved now but continued numbness.  Possible: rotational vertebrobasilar insufficiency or bow hunter syndrome: if not continuing into improve consider eval of posterior circulation with MRA.  Refer to neuro for further eval.

## 2016-01-27 NOTE — Telephone Encounter (Signed)
Appt scheduled and records requested

## 2016-01-27 NOTE — Telephone Encounter (Signed)
plz schedule with PCP at 11:15am. Will cc pcp.

## 2016-01-29 ENCOUNTER — Encounter: Payer: Self-pay | Admitting: Family Medicine

## 2016-01-29 DIAGNOSIS — R202 Paresthesia of skin: Principal | ICD-10-CM

## 2016-01-29 DIAGNOSIS — R200 Anesthesia of skin: Secondary | ICD-10-CM

## 2016-01-29 DIAGNOSIS — R42 Dizziness and giddiness: Secondary | ICD-10-CM

## 2016-01-30 ENCOUNTER — Encounter: Payer: Self-pay | Admitting: Family Medicine

## 2016-01-30 ENCOUNTER — Encounter: Payer: Self-pay | Admitting: Neurology

## 2016-01-30 ENCOUNTER — Ambulatory Visit (INDEPENDENT_AMBULATORY_CARE_PROVIDER_SITE_OTHER): Payer: BC Managed Care – PPO | Admitting: Neurology

## 2016-01-30 VITALS — BP 143/87 | HR 87 | Ht 66.0 in | Wt 227.5 lb

## 2016-01-30 DIAGNOSIS — D329 Benign neoplasm of meninges, unspecified: Secondary | ICD-10-CM | POA: Diagnosis not present

## 2016-01-30 DIAGNOSIS — R202 Paresthesia of skin: Secondary | ICD-10-CM

## 2016-01-30 DIAGNOSIS — H8110 Benign paroxysmal vertigo, unspecified ear: Secondary | ICD-10-CM

## 2016-01-30 NOTE — Progress Notes (Signed)
PATIENT: Kimberly Patton DOB: [birth year]  Chief Complaint  Patient presents with  . Meningioma    She has been having tingling in her neck, cheeks, lips, behind ears and behind eyes for the last three weeks.  Reports dizziness to be resolved.  She would like to review her MRI and CT scans.     HISTORICAL  Kimberly Patton is a 60 years old right-handed female, seen in refer by her primary care physician Dr. [name] for evaluation of abnormal MRI scans, and numbness tingling of January 30 2016.  I reviewed and summarized the referring notes, she had past medical history of gastric reflux, obesity, status post gastric bariatric surgery in 2014 with 160 pounds weight loss, vitamin D deficiency on supplement.   And of July 2017, after swimming while lying down resting, she noticed sudden onset vertigo, the cloud on the sky was moving fast, she felt nausea, she was able to walk without much difficulty, since initial symptoms, she noticed intermittent transient vertigo, denied hearing loss, denied gait abnormality, she was evaluated by ENT Dr. Lucia Gaskins, there was no abnormality found.  At the same time she noticed right eye light sensitivity, She was seen by ophthalmologist Dr. Zadie Rhine, was diagnosed with right retinal tear, was treated with laser surgery, which has helped her symptoms.  Since her right eye laser surgery in January 18 2016, her vertigo has much improved, but she began to experience radiating paresthesia from her neck to her skull, intermittent, lasting for 20 minutes, she has been taking baby aspirin twice a day, numbness tingling also involving her face, lips, cross forehead, symmetric.  She denies persistent limb paresthesia or weakness, she denies gait abnormality, no incontinence  She was referred for CAT scan, and MRI of brain with and without contrast in August 2017, where personally reviewed the film, 1.9x1.8x1.7 meningioma at the left frontal parietal convexity, there was  no associated edema. Left frontal parietal skull lobulated sebaceous cyst.  She also complains of excessive stress at home,  REVIEW OF SYSTEMS: Full 14 system review of systems performed and notable only for numbness, dizziness, spreading ears, spinning sensation,  ALLERGIES: No Known Allergies  HOME MEDICATIONS: Current Outpatient Prescriptions  Medication Sig Dispense Refill  . aspirin EC 81 MG tablet Take 1 tablet (81 mg total) by mouth daily.    . Calcium Citrate-Vitamin D (CITRACAL + D PO) Take 1 tablet by mouth daily.    . Cyanocobalamin (VITAMIN B-12 SL) Place 1 tablet under the tongue every morning.    . diltiazem (CARDIZEM CD) 180 MG 24 hr capsule TAKE 1 CAPSULE (180 MG TOTAL) BY MOUTH DAILY. 30 capsule 5  . hydrochlorothiazide (HYDRODIURIL) 25 MG tablet TAKE 1 TABLET BY MOUTH EVERY DAY 30 tablet 5  . Misc Natural Products (OSTEO BI-FLEX TRIPLE STRENGTH) TABS Take 2 tablets by mouth every morning.    . Omega-3 Fatty Acids (FISH OIL PO) Take 1 capsule by mouth daily.    Marland Kitchen omeprazole (PRILOSEC) 40 MG capsule Take 1 capsule (40 mg total) by mouth daily. 30 capsule 1   No current facility-administered medications for this visit.     PAST MEDICAL HISTORY: Past Medical History:  Diagnosis Date  . ALLERGIC RHINITIS 04/03/2007  . ASTHMA, PERSISTENT, MODERATE 04/03/2007  . Dyspnea on exertion    a. 11/2007 Echo: EF 60%.  Marland Kitchen GERD (gastroesophageal reflux disease)   . 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 Hypertension   . Joint pain    HIPS /  LEGS  . Knee injury    RT  . Meningioma ([place])   . Morbid obesity ([place])   . Paroxysmal SVT (supraventricular tachycardia) ([place])    a. 11/2011 48h Holter: RSR, rare PVC's, occas PAC's  . Retinal tear of right eye 01/2016  . Ventral hernia     PAST SURGICAL HISTORY: Past Surgical History:  Procedure Laterality Date  . APPENDECTOMY  1996  . CESAREAN SECTION     x2  . CHOLECYSTECTOMY  1995  . EYE SURGERY  01/2016   Repair retinal tear  . [place] / 1996   x2  . KNEE ARTHROSCOPY W/ MENISCAL REPAIR  07/25/13   right knee Dr. Mardelle Matte  . LAPAROSCOPIC GASTRIC SLEEVE RESECTION N/A 06/08/2013   Procedure: LAPAROSCOPIC GASTRIC SLEEVE RESECTION AND EXCISION OF SEBACEUS CYST FROM MID CHEST takedown of incarcerated ventral hernia and primary repair endoscopy;  Surgeon: [name], MD;  Location: [facility];  Service: General;  Laterality: N/A;  . moles  06/2013   removed 2 moles from under arm and  lowback  . OVARIAN CYST REMOVAL  1970  . PILONIDAL CYST EXCISION  1975  . RETINAL TEAR REPAIR CRYOTHERAPY Right 01/2016   [place]  . SKIN CANCER EXCISION    . TONSILLECTOMY    . UPPER GI ENDOSCOPY  06/08/2013   Procedure: UPPER GI ENDOSCOPY;  Surgeon: [name], MD;  Location: [facility];  Service: General;;  . US ECHOCARDIOGRAPHY  12/2011   WNL - EF 55-60%, mild MR, grade 1 diastolic dysnfiction (mild)  . VENTRAL HERNIA REPAIR  2000    FAMILY HISTORY: Family History  Problem Relation Age of Onset  . Breast cancer Mother   . Diabetes Mother   . Hypertension Mother   . Kidney failure Mother   . Diabetes Father   . Hypertension Father   . Diabetes    . Hypertension    . Stroke    . Hemophilia      SOCIAL HISTORY:  Social History   Social History  . Marital status: Married    Spouse name: N/A  . Number of children: 2  . Years of education: Bachelors   Occupational History  . teacher [employer]   Social History Main Topics  . Smoking status: Never Smoker  . Smokeless tobacco: Never Used     Comment: smoke at age 53-12  . Alcohol use 0.0 oz/week     Comment: Occasionally  . Drug use: No  . Sexual activity: Yes    Partners: Male    Birth control/ protection: Post-menopausal   Other Topics Concern  . Not on file   Social History Narrative   Lives at home alone.   Right-handed.   Occasional caffeine use.     PHYSICAL EXAM   Vitals:   01/30/16 0849  BP: (!) 143/87  Pulse: 87    Weight: 227 lb 8 oz (103.2 kg)  Height: 5\' 6"  (1.676 m)    Not recorded      Body mass index is 36.72 kg/m.  PHYSICAL EXAMNIATION:  Gen: NAD, conversant, well nourised, obese, well groomed                     Cardiovascular: Regular rate rhythm, no peripheral edema, warm, nontender. Eyes: Conjunctivae clear without exudates or hemorrhage Neck: Supple, no carotid bruise. Pulmonary: Clear to auscultation bilaterally   NEUROLOGICAL EXAM:  MENTAL STATUS: Speech:    Speech is normal; fluent  and spontaneous with normal comprehension.  Cognition:     Orientation to time, place and person     Normal recent and remote memory     Normal Attention span and concentration     Normal Language, naming, repeating,spontaneous speech     Fund of knowledge   CRANIAL NERVES: CN II: Visual fields are full to confrontation. Fundoscopic exam is normal with sharp discs and no vascular changes. Pupils are round equal and briskly reactive to light. CN III, IV, VI: extraocular movement are normal. No ptosis. CN V: Facial sensation is intact to pinprick in all 3 divisions bilaterally. Corneal responses are intact.  CN VII: Face is symmetric with normal eye closure and smile. CN VIII: Hearing is normal to rubbing fingers CN IX, X: Palate elevates symmetrically. Phonation is normal. CN XI: Head turning and shoulder shrug are intact CN XII: Tongue is midline with normal movements and no atrophy.  MOTOR: There is no pronator drift of out-stretched arms. Muscle bulk and tone are normal. Muscle strength is normal.  REFLEXES: Reflexes are 2+ and symmetric at the biceps, triceps, knees, and ankles. Plantar responses are flexor.  SENSORY: Intact to light touch, pinprick, positional sensation and vibratory sensation are intact in fingers and toes.  COORDINATION: Rapid alternating movements and fine finger movements are intact. There is no dysmetria on finger-to-nose and heel-knee-shin.     GAIT/STANCE: Posture is normal. Gait is steady with normal steps, base, arm swing, and turning. Heel and toe walking are normal. Tandem gait is normal.  Romberg is absent.   DIAGNOSTIC DATA (LABS, IMAGING, TESTING) - I reviewed patient records, labs, notes, testing and imaging myself where available.   ASSESSMENT AND PLAN  ARYN MOLE is a 60 y.o. female   Acute onset of vertigo, most consistent with benign positional vertigo, has much improved Incidental findings of left frontal parietal meningioma  Continue to observe, not account for her symptoms Neck pain, radiating paresthesia  Brisk reflex on examination, need to rule out cervical pathology,  Proceed with MRI of cervical spine   Marcial Pacas, M.D. Ph.D.  [facility] [address] [place], [place] [address] Ph: [phone] Fax: [phone]  CC: Amy E Bedsole,

## 2016-02-06 ENCOUNTER — Other Ambulatory Visit: Payer: Self-pay | Admitting: Family Medicine

## 2016-02-06 NOTE — Telephone Encounter (Signed)
Last office visit 01/27/2016.  Last CPE 01/20/2015.  No future appointments. Ok to refill?

## 2016-02-09 ENCOUNTER — Inpatient Hospital Stay: Admission: RE | Admit: 2016-02-09 | Payer: Self-pay | Source: Ambulatory Visit

## 2016-02-09 ENCOUNTER — Telehealth: Payer: Self-pay | Admitting: *Deleted

## 2016-02-09 NOTE — Telephone Encounter (Signed)
MRI cervical spine w/o approved from 02/09/16 through 03/09/16 - order [id number].

## 2016-02-14 ENCOUNTER — Encounter: Payer: Self-pay | Admitting: Neurology

## 2016-02-15 ENCOUNTER — Telehealth: Payer: Self-pay | Admitting: *Deleted

## 2016-02-15 NOTE — Telephone Encounter (Signed)
MRI cervical spine w/o contrast completed at [facility] on 02/09/16 - ordered by Dr. Rennis Harding.  Impression:  Mild degenerative changes.  There is no spinal stenosis or disc herniation.

## 2016-02-22 NOTE — Telephone Encounter (Signed)
Would you mind calling to let this patient know it has been sent to [facility] and approved?

## 2016-03-20 ENCOUNTER — Ambulatory Visit: Payer: BC Managed Care – PPO | Admitting: Neurology

## 2016-04-02 ENCOUNTER — Telehealth: Payer: Self-pay | Admitting: Family Medicine

## 2016-04-02 NOTE — Telephone Encounter (Signed)
Last office visit 01/29/2016.  Last CPE 01/20/2015.  No future appointments.  Ok to refill?

## 2016-04-03 NOTE — Telephone Encounter (Signed)
Pt needs CPX with labs prior.. Schedule . Will refill x 90 days.

## 2016-04-03 NOTE — Telephone Encounter (Signed)
Please call and schedule CPE with fasting labs prior for Dr. Bedsole.  

## 2016-04-04 NOTE — Telephone Encounter (Signed)
LVM on pt cell to call back and schedule cpe with labs prior with Dr. Diona Browner

## 2016-04-11 NOTE — Telephone Encounter (Signed)
Labs 12/21 cpx 1/2 Pt aware

## 2016-05-09 ENCOUNTER — Other Ambulatory Visit: Payer: Self-pay | Admitting: Family Medicine

## 2016-05-24 ENCOUNTER — Telehealth: Payer: Self-pay | Admitting: Family Medicine

## 2016-05-24 DIAGNOSIS — I10 Essential (primary) hypertension: Secondary | ICD-10-CM

## 2016-05-24 NOTE — Telephone Encounter (Signed)
-----   Message from Marchia Bond sent at 05/23/2016 11:48 AM EST ----- Regarding: Cpx labs Wed 12/20, need orders. Thanks! :-) Please order  future cpx labs for pt's upcoming lab appt. Thanks Aniceto Boss

## 2016-05-31 ENCOUNTER — Other Ambulatory Visit (INDEPENDENT_AMBULATORY_CARE_PROVIDER_SITE_OTHER): Payer: BC Managed Care – PPO

## 2016-05-31 DIAGNOSIS — I10 Essential (primary) hypertension: Secondary | ICD-10-CM

## 2016-05-31 LAB — LIPID PANEL
Cholesterol: 183 mg/dL (ref 0–200)
HDL: 61.7 mg/dL (ref >39.00)
LDL Cholesterol: 108 mg/dL — ABNORMAL HIGH (ref 0–99)
NonHDL: 121.49
Total CHOL/HDL Ratio: 3
Triglycerides: 67 mg/dL (ref 0.0–149.0)
VLDL: 13.4 mg/dL (ref 0.0–40.0)

## 2016-05-31 LAB — COMPREHENSIVE METABOLIC PANEL
ALT: 10 U/L (ref 0–35)
AST: 13 U/L (ref 0–37)
Albumin: 3.8 g/dL (ref 3.5–5.2)
Alkaline Phosphatase: 80 U/L (ref 39–117)
BUN: 15 mg/dL (ref 6–23)
CO2: 34 mEq/L — ABNORMAL HIGH (ref 19–32)
Calcium: 9.2 mg/dL (ref 8.4–10.5)
Chloride: 104 mEq/L (ref 96–112)
Creatinine, Ser: 0.51 mg/dL (ref 0.40–1.20)
GFR: 130.48 mL/min (ref >60.00)
Glucose, Bld: 87 mg/dL (ref 70–99)
Potassium: 4 mEq/L (ref 3.5–5.1)
Sodium: 142 mEq/L (ref 135–145)
Total Bilirubin: 0.6 mg/dL (ref 0.2–1.2)
Total Protein: 6.6 g/dL (ref 6.0–8.3)

## 2016-06-12 ENCOUNTER — Ambulatory Visit (INDEPENDENT_AMBULATORY_CARE_PROVIDER_SITE_OTHER): Payer: BC Managed Care – PPO | Admitting: Family Medicine

## 2016-06-12 ENCOUNTER — Other Ambulatory Visit: Payer: Self-pay | Admitting: Family Medicine

## 2016-06-12 ENCOUNTER — Encounter: Payer: Self-pay | Admitting: Family Medicine

## 2016-06-12 ENCOUNTER — Other Ambulatory Visit: Payer: Self-pay | Admitting: *Deleted

## 2016-06-12 VITALS — BP 100/64 | HR 67 | Temp 98.3°F | Ht 66.0 in | Wt 240.8 lb

## 2016-06-12 DIAGNOSIS — J4520 Mild intermittent asthma, uncomplicated: Secondary | ICD-10-CM

## 2016-06-12 DIAGNOSIS — I10 Essential (primary) hypertension: Secondary | ICD-10-CM

## 2016-06-12 DIAGNOSIS — Z0000 Encounter for general adult medical examination without abnormal findings: Secondary | ICD-10-CM | POA: Diagnosis not present

## 2016-06-12 DIAGNOSIS — Z1211 Encounter for screening for malignant neoplasm of colon: Secondary | ICD-10-CM | POA: Diagnosis not present

## 2016-06-12 NOTE — Progress Notes (Signed)
Subjective:    Patient ID: Kimberly Patton, female    DOB: 11-07-1955, 61 y.o.   MRN: [id number]  HPI  The patient is here for annual wellness exam and preventative care.    She has gained 21 lbs in last 6 months. Has not been able to eat any meat. Has been a lot of carbs. Hx of gastric sleeve. Rare GERD. Did have episode after adjustment.  No exercise: plans to start water exercise.  She is seeing chiropractor for  Scoliosis, neck pain  Dr Patrice Paradise , Dr Anna Genre.Marland Kitchen  Neurosurgery.. For worker comp after fall (3 years ago).. Neck pain. Pain is tolerable after steroid injections.  occ numbness in hands at times.  Hypertension:   Good control on diltiazem and HCTZ BP Readings from Last 3 Encounters:  06/12/16 100/64  01/30/16 (!) 143/87  01/27/16 118/70   Using medication without problems or lightheadedness:  Chest pain with exertion:None Edema:occ Short of breath: none Average home BPs: good Other issues:  AHA risk 2.4% 10 year risk.. No indication for cholesterol medication. Lab Results  Component Value Date   CHOL 183 05/31/2016   HDL 61.70 05/31/2016   LDLCALC 108 (H) 05/31/2016   TRIG 67.0 05/31/2016   CHOLHDL 3 05/31/2016   Asthma, mild intermittant: no flares in last few years.  Obese Body mass index is 38.86 kg/m. Wt Readings from Last 3 Encounters:  06/12/16 240 lb 12 oz (109.2 kg)  01/30/16 227 lb 8 oz (103.2 kg)  01/27/16 229 lb 8 oz (104.1 kg)      Social History /Family History/Past Medical History reviewed and updated if needed.  Review of Systems  Constitutional: Negative for fatigue and fever.  HENT: Negative for congestion.   Eyes: Negative for pain.  Respiratory: Negative for cough and shortness of breath.   Cardiovascular: Negative for chest pain, palpitations and leg swelling.  Gastrointestinal: Negative for abdominal pain.  Genitourinary: Negative for dysuria and vaginal bleeding.  Musculoskeletal: Positive for back pain.  Neurological:  Negative for syncope, light-headedness and headaches.  Psychiatric/Behavioral: Negative for dysphoric mood.       Objective:   Physical Exam  Constitutional: Vital signs are normal. She appears well-developed and well-nourished. She is cooperative.  Non-toxic appearance. She does not appear ill. No distress.  HENT:  Head: Normocephalic.  Right Ear: Hearing, tympanic membrane, external ear and ear canal normal.  Left Ear: Hearing, tympanic membrane, external ear and ear canal normal.  Nose: Nose normal.  Eyes: Conjunctivae, EOM and lids are normal. Pupils are equal, round, and reactive to light. Lids are everted and swept, no foreign bodies found.  Neck: Trachea normal and normal range of motion. Neck supple. Carotid bruit is not present. No thyroid mass and no thyromegaly present.  Cardiovascular: Normal rate, regular rhythm, S1 normal, S2 normal, normal heart sounds and intact distal pulses.  Exam reveals no gallop.   No murmur heard. Pulmonary/Chest: Effort normal and breath sounds normal. No respiratory distress. She has no wheezes. She has no rhonchi. She has no rales.  Abdominal: Soft. Normal appearance and bowel sounds are normal. She exhibits no distension, no fluid wave, no abdominal bruit and no mass. There is no hepatosplenomegaly. There is no tenderness. There is no rebound, no guarding and no CVA tenderness. No hernia.  Lymphadenopathy:    She has no cervical adenopathy.    She has no axillary adenopathy.  Neurological: She is alert. She has normal strength. No cranial nerve deficit or sensory deficit.  Skin: Skin is warm, dry and intact. No rash noted.  Psychiatric: Her speech is normal and behavior is normal. Judgment normal. Her mood appears not anxious. Cognition and memory are normal. She does not exhibit a depressed mood.   ttp over left lower rib cage to palpation       Assessment & Plan:  The patient's preventative maintenance and recommended screening tests for an  annual wellness exam were reviewed in full today. Brought up to date unless services declined.  Counselled on the importance of diet, exercise, and its role in overall health and mortality. The patient's FH and SH was reviewed, including their home life, tobacco status, and drug and alcohol status.   Vaccines:uptodate flu and td. Due for shingles vaccine..refused Pap/DVE:  2016  neg pap, neg HPV.Marland Kitchen Repeat in 2021, no family history of ovarian/ovarian cancer.Marland Kitchen No DVE. Mammo: 01/04/2016 Bone Density: low risk, start screen age 73. Colon: ifob neg 2016, due now Smoking Status:nonsmoker ETOH/ drug use:  Rare wine/no drugs  Hep C: done  HIV screen:  done

## 2016-06-12 NOTE — Progress Notes (Signed)
Pre visit review using our clinic review tool, if applicable. No additional management support is needed unless otherwise documented below in the visit note. 

## 2016-06-12 NOTE — Assessment & Plan Note (Signed)
Stable control on no meds. 

## 2016-06-12 NOTE — Patient Instructions (Addendum)
Work on [employer]. Low carb diet.  Start water exercise as able.  Stop at lab to stool card.

## 2016-06-12 NOTE — Assessment & Plan Note (Signed)
Well controlled. Continue current medication.  

## 2016-06-19 ENCOUNTER — Other Ambulatory Visit (INDEPENDENT_AMBULATORY_CARE_PROVIDER_SITE_OTHER): Payer: BC Managed Care – PPO

## 2016-06-19 DIAGNOSIS — Z1211 Encounter for screening for malignant neoplasm of colon: Secondary | ICD-10-CM

## 2016-06-19 LAB — FECAL OCCULT BLOOD, IMMUNOCHEMICAL: Fecal Occult Bld: NEGATIVE

## 2016-07-08 ENCOUNTER — Other Ambulatory Visit: Payer: Self-pay | Admitting: Family Medicine

## 2016-07-17 DIAGNOSIS — M179 Osteoarthritis of knee, unspecified: Secondary | ICD-10-CM | POA: Insufficient documentation

## 2016-07-17 DIAGNOSIS — M1710 Unilateral primary osteoarthritis, unspecified knee: Secondary | ICD-10-CM | POA: Insufficient documentation

## 2016-07-24 ENCOUNTER — Encounter: Payer: Self-pay | Admitting: Family Medicine

## 2016-07-25 MED ORDER — PANTOPRAZOLE SODIUM 40 MG PO TBEC: 40.0000 mg | DELAYED_RELEASE_TABLET | Freq: Every day | ORAL | 3 refills | Status: DC

## 2016-08-05 ENCOUNTER — Other Ambulatory Visit: Payer: Self-pay | Admitting: Family Medicine

## 2016-09-11 ENCOUNTER — Encounter: Payer: Self-pay | Admitting: *Deleted

## 2016-09-11 ENCOUNTER — Ambulatory Visit (INDEPENDENT_AMBULATORY_CARE_PROVIDER_SITE_OTHER)
Admission: RE | Admit: 2016-09-11 | Discharge: 2016-09-11 | Disposition: A | Discharge status: Home / Self Care | Payer: BC Managed Care – PPO | Source: Ambulatory Visit | Attending: Family Medicine | Admitting: Family Medicine

## 2016-09-11 ENCOUNTER — Ambulatory Visit (INDEPENDENT_AMBULATORY_CARE_PROVIDER_SITE_OTHER): Payer: BC Managed Care – PPO | Admitting: Family Medicine

## 2016-09-11 ENCOUNTER — Encounter: Payer: Self-pay | Admitting: Family Medicine

## 2016-09-11 VITALS — BP 110/78 | HR 53 | Temp 97.9°F | Ht 66.0 in | Wt 242.2 lb

## 2016-09-11 DIAGNOSIS — R1011 Right upper quadrant pain: Secondary | ICD-10-CM | POA: Insufficient documentation

## 2016-09-11 DIAGNOSIS — R0781 Pleurodynia: Secondary | ICD-10-CM | POA: Insufficient documentation

## 2016-09-11 LAB — CBC WITH DIFFERENTIAL/PLATELET
Basophils Absolute: 0 10*3/uL (ref 0.0–0.1)
Basophils Relative: 0.6 % (ref 0.0–3.0)
Eosinophils Absolute: 0.1 10*3/uL (ref 0.0–0.7)
Eosinophils Relative: 1.4 % (ref 0.0–5.0)
HCT: 38 % (ref 36.0–46.0)
Hemoglobin: 12.5 g/dL (ref 12.0–15.0)
Lymphocytes Relative: 24.8 % (ref 12.0–46.0)
Lymphs Abs: 1.9 10*3/uL (ref 0.7–4.0)
MCHC: 33 g/dL (ref 30.0–36.0)
MCV: 82 fl (ref 78.0–100.0)
Monocytes Absolute: 0.4 10*3/uL (ref 0.1–1.0)
Monocytes Relative: 5.6 % (ref 3.0–12.0)
Neutro Abs: 5.2 10*3/uL (ref 1.4–7.7)
Neutrophils Relative %: 67.6 % (ref 43.0–77.0)
Platelets: 321 10*3/uL (ref 150.0–400.0)
RBC: 4.63 Mil/uL (ref 3.87–5.11)
RDW: 14.3 % (ref 11.5–15.5)
WBC: 7.7 10*3/uL (ref 4.0–10.5)

## 2016-09-11 LAB — LIPASE: Lipase: 58 U/L (ref 11.0–59.0)

## 2016-09-11 LAB — COMPREHENSIVE METABOLIC PANEL
ALT: 12 U/L (ref 0–35)
AST: 14 U/L (ref 0–37)
Albumin: 4.3 g/dL (ref 3.5–5.2)
Alkaline Phosphatase: 95 U/L (ref 39–117)
BUN: 21 mg/dL (ref 6–23)
CO2: 32 mEq/L (ref 19–32)
Calcium: 10 mg/dL (ref 8.4–10.5)
Chloride: 103 mEq/L (ref 96–112)
Creatinine, Ser: 0.54 mg/dL (ref 0.40–1.20)
GFR: 122.04 mL/min (ref >60.00)
Glucose, Bld: 89 mg/dL (ref 70–99)
Potassium: 4.5 mEq/L (ref 3.5–5.1)
Sodium: 143 mEq/L (ref 135–145)
Total Bilirubin: 0.4 mg/dL (ref 0.2–1.2)
Total Protein: 7.4 g/dL (ref 6.0–8.3)

## 2016-09-11 NOTE — Progress Notes (Signed)
Pre visit review using our clinic review tool, if applicable. No additional management support is needed unless otherwise documented below in the visit note. 

## 2016-09-11 NOTE — Progress Notes (Signed)
   Subjective:    Patient ID: Kimberly Patton, female    DOB: 30-Apr-1956, 61 y.o.   MRN: [id number]  HPI   61 year old female presents with new onset RUQ abd pain.  She was wrestling around about 2 weeks ago.. Had sudden onset pain  And popping sound. Initial not able to lie flat. Pain was under right breast in lower rib cage. She treated with  Tramadol she had leftover. Iced area. Has not used any other med to treat.   Saw chiropractor  In last 3 weeks.. Feels misaligned ribs, dislocated.  Adjusted.  Currently has soreness abd pressure in RUQ, right lower ribs.  She is S/P cholecystectomy  In 1995. S/P gastric bypass. SAlso has mesh for hernia midline in place. NSAIDs contraindicated. She currently also being treated for back pain with workers comp. Chiropractor treated low back with laser PT treatment.  No flu like symptoms, no N/V, no family history of liver issue.  no new meds. No reflux.     Review of Systems  Constitutional: Negative for fatigue and fever.  HENT: Negative for ear pain.   Eyes: Negative for pain.  Respiratory: Negative for chest tightness and shortness of breath.   Cardiovascular: Negative for chest pain, palpitations and leg swelling.  Gastrointestinal: Negative for abdominal pain.  Genitourinary: Negative for dysuria.       Objective:   Physical Exam  Constitutional: Vital signs are normal. She appears well-developed and well-nourished. She is cooperative.  Non-toxic appearance. She does not appear ill. No distress.  HENT:  Head: Normocephalic.  Right Ear: Hearing, tympanic membrane, external ear and ear canal normal. Tympanic membrane is not erythematous, not retracted and not bulging.  Left Ear: Hearing, tympanic membrane, external ear and ear canal normal. Tympanic membrane is not erythematous, not retracted and not bulging.  Nose: No mucosal edema or rhinorrhea. Right sinus exhibits no maxillary sinus tenderness and no frontal sinus tenderness.  Left sinus exhibits no maxillary sinus tenderness and no frontal sinus tenderness.  Mouth/Throat: Uvula is midline, oropharynx is clear and moist and mucous membranes are normal.  Eyes: Conjunctivae, EOM and lids are normal. Pupils are equal, round, and reactive to light. Lids are everted and swept, no foreign bodies found.  Neck: Trachea normal and normal range of motion. Neck supple. Carotid bruit is not present. No thyroid mass and no thyromegaly present.  Cardiovascular: Normal rate, regular rhythm, S1 normal, S2 normal, normal heart sounds, intact distal pulses and normal pulses.  Exam reveals no gallop and no friction rub.   No murmur heard. Pulmonary/Chest: Effort normal and breath sounds normal. No tachypnea. No respiratory distress. She has no decreased breath sounds. She has no wheezes. She has no rhonchi. She has no rales.  ttp anterior over last 4 ribs, non-focal pain, no mass palpated.  Abdominal: Soft. Normal appearance and bowel sounds are normal. There is no hepatosplenomegaly. There is tenderness in the right upper quadrant. There is guarding. There is no rigidity, no rebound and no CVA tenderness. No hernia.  Neurological: She is alert.  Skin: Skin is warm, dry and intact. No rash noted.  Psychiatric: Her speech is normal and behavior is normal. Judgment and thought content normal. Her mood appears not anxious. Cognition and memory are normal. She does not exhibit a depressed mood.          Assessment & Plan:

## 2016-09-11 NOTE — Assessment & Plan Note (Signed)
Possible referred pain.Marland Kitchen eval with labs  CMET, cbc and lipase. No new meds.  No clear sign of GI issue ie gastritis or reflux.

## 2016-09-11 NOTE — Assessment & Plan Note (Signed)
Possible MSK strain, ? Dislocation of ribs, rib fracture.  Will check rib films to rule out pathologic fracture.  No clear sign of lung infection or issue.

## 2016-09-11 NOTE — Patient Instructions (Addendum)
Stop at  Lab on way out.  We will call with X-ray. Ice and start gentle chest wall stretches.

## 2016-10-11 ENCOUNTER — Telehealth: Payer: Self-pay

## 2016-10-11 DIAGNOSIS — E348 Other specified endocrine disorders: Secondary | ICD-10-CM

## 2016-10-11 NOTE — Telephone Encounter (Signed)
Pt just saw her back doctor and that doctor advised pt to call her PCP to get bone density test scheduled. 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 Pt last annual 06/12/16 and noted in that visit to start screening bone density at age 61.

## 2016-10-22 ENCOUNTER — Encounter: Payer: Self-pay | Admitting: Emergency Medicine

## 2016-10-22 ENCOUNTER — Emergency Department
Admission: EM | Admit: 2016-10-22 | Discharge: 2016-10-22 | Disposition: A | Discharge status: Home / Self Care | Payer: Worker's Compensation | Attending: Emergency Medicine | Admitting: Emergency Medicine

## 2016-10-22 ENCOUNTER — Encounter: Payer: Self-pay | Admitting: Family Medicine

## 2016-10-22 ENCOUNTER — Emergency Department: Payer: Worker's Compensation

## 2016-10-22 DIAGNOSIS — I10 Essential (primary) hypertension: Secondary | ICD-10-CM | POA: Insufficient documentation

## 2016-10-22 DIAGNOSIS — J45909 Unspecified asthma, uncomplicated: Secondary | ICD-10-CM | POA: Diagnosis not present

## 2016-10-22 DIAGNOSIS — Z79899 Other long term (current) drug therapy: Secondary | ICD-10-CM | POA: Insufficient documentation

## 2016-10-22 DIAGNOSIS — M25561 Pain in right knee: Secondary | ICD-10-CM | POA: Diagnosis not present

## 2016-10-22 MED ORDER — ACETAMINOPHEN 500 MG PO TABS
1000.0000 mg | ORAL_TABLET | Freq: Once | ORAL | Status: AC
  Administered 2016-10-22: 1000 mg via ORAL

## 2016-10-22 MED ORDER — ACETAMINOPHEN 500 MG PO TABS
ORAL_TABLET | ORAL | Status: AC
  Administered 2016-10-22: 1000 mg via ORAL
  Filled 2016-10-22: qty 2

## 2016-10-22 MED ORDER — OXYCODONE HCL 5 MG PO TABS: 5.0000 mg | ORAL_TABLET | Freq: Four times a day (QID) | ORAL | 0 refills | Status: DC | PRN

## 2016-10-22 NOTE — Discharge Instructions (Signed)
Please continue to ice your knee for 20 minutes every 2 hours. You may take Tylenol for mild to moderate pain, and oxycodone for severe pain. Do not drive within 8 hours of taking oxycodone. You may wear a knee brace, but remember to take your knee brace off and range her knee at least once an hour.  Please make a follow-up appointment with your sports medicine doctor at [facility].  Return to the emergency department if you develop severe pain, inability to walk, swelling, fever, or any other symptoms concerning to you.

## 2016-10-22 NOTE — ED Provider Notes (Signed)
[facility] Emergency Department Provider Note  ____________________________________________  Time seen: Approximately 8:58 PM  I have reviewed the triage vital signs and the nursing notes.   HISTORY  Chief Complaint Knee Pain    HPI Kimberly Patton is a 61 y.o. female with a history of bilateral recurrent meniscus tears, bilateral knee surgeries, and stem cell injection to the bilateral knees on Thursday presenting with inability to extend the right knee without pain. The patient reports that she has been doing well until this morning when she woke up and had significant pain in the posterior knee with extension, as well as minimal lateral pain just below the knee with walking. She did not try any medication for pain. She called her sports medicine doctor at [facility], Dr. Marlaine Hind, who had injected her knee who recommended emergency Department evaluation for DVT. The patient denies any numbness or tingling, new trauma, skin changes, or systemic illness including fever or vomiting.   Past Medical History:  Diagnosis Date  . ALLERGIC RHINITIS 04/03/2007  . ASTHMA, PERSISTENT, MODERATE 04/03/2007  . Dyspnea on exertion    a. 11/2007 Echo: EF 60%.  Marland Kitchen GERD (gastroesophageal reflux disease)   . 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 Hypertension   . Joint pain    HIPS / LEGS  . Knee injury    RT  . Meningioma ([place])   . Morbid obesity ([place])   . Paroxysmal SVT (supraventricular tachycardia) ([place])    a. 11/2011 48h Holter: RSR, rare PVC's, occas PAC's  . Retinal tear of right eye 01/2016  . Ventral hernia     Patient Active Problem List   Diagnosis Date Noted  . Rib pain on right side 09/11/2016  . RUQ pain 09/11/2016  . Osteoarthritis of knee 07/17/2016  . Meningioma ([place]) 01/18/2016  . Family history of aortic stenosis 01/20/2015  . Status post laparoscopic sleeve gastrectomy Dec 2014 06/08/2013  . Body mass index (BMI) of 36.0-36.9 in adult 12/08/2012  . HTN  (hypertension) 10/11/2011  . Varicose veins of lower extremities with other complications 01/74/9449  . FIBROIDS, UTERUS 04/03/2007  . ALLERGIC RHINITIS 04/03/2007  . Asthma, mild intermittent 04/03/2007    Past Surgical History:  Procedure Laterality Date  . APPENDECTOMY  1996  . CESAREAN SECTION     x2  . CHOLECYSTECTOMY  1995  . EYE SURGERY  01/2016   Repair retinal tear  . [place] / 1996   x2  . KNEE ARTHROSCOPY W/ MENISCAL REPAIR  07/25/13   right knee Dr. Mardelle Matte  . LAPAROSCOPIC GASTRIC SLEEVE RESECTION N/A 06/08/2013   Procedure: LAPAROSCOPIC GASTRIC SLEEVE RESECTION AND EXCISION OF SEBACEUS CYST FROM MID CHEST takedown of incarcerated ventral hernia and primary repair endoscopy;  Surgeon: [name], MD;  Location: [facility] ORS;  Service: General;  Laterality: N/A;  . moles  06/2013   removed 2 moles from under arm and  lowback  . OVARIAN CYST REMOVAL  1970  . PILONIDAL CYST EXCISION  1975  . RETINAL TEAR REPAIR CRYOTHERAPY Right 01/2016   [place]  . SKIN CANCER EXCISION    . TONSILLECTOMY    . UPPER GI ENDOSCOPY  06/08/2013   Procedure: UPPER GI ENDOSCOPY;  Surgeon: [name], MD;  Location: [facility]S;  Service: General;;  . US ECHOCARDIOGRAPHY  12/2011   WNL - EF 55-60%, mild MR, grade 1 diastolic dysnfiction (mild)  . VENTRAL HERNIA REPAIR  2000    Current Outpatient Rx  . Order #:  [id number] Class: Historical Med  . Order #: [id number] Class: Historical Med  . Order #: [id number] Class: Normal  . Order #: [id number] Class: Normal  . Order #: [id number] Class: Print  . Order #: [id number] Class: Normal  . Order #: [id number] Class: Historical Med    Allergies Wasp venom and Sodium hypochlorite  Family History  Problem Relation Age of Onset  . Breast cancer Mother   . Diabetes Mother   . Hypertension Mother   . Kidney failure Mother   . Diabetes Father   . Hypertension Father   . Diabetes Unknown   . Hypertension Unknown   . Stroke Unknown   .  Hemophilia Unknown     Social History Social History  Substance Use Topics  . Smoking status: Never Smoker  . Smokeless tobacco: Never Used     Comment: smoke at age 9-12  . Alcohol use 0.0 oz/week     Comment: Occasionally    Review of Systems Constitutional: No fever/chills.No lightheadedness or syncope. Eyes: No visual changes. ENT:  No congestion or rhinorrhea. Respiratory: Denies shortness of breath.  No cough. Gastrointestinal:  No nausea, no vomiting.   Musculoskeletal: Negative for back pain. Positive right knee pain with extension and ambulation. Skin: Negative for rash. Neurological: Negative for headaches. No focal numbness, tingling or weakness.   10-point ROS otherwise negative.  ____________________________________________   PHYSICAL EXAM:  VITAL SIGNS: ED Triage Vitals  Enc Vitals Group     BP 10/22/16 1743 (!) 150/87     Pulse Rate 10/22/16 1743 76     Resp 10/22/16 1743 18     Temp 10/22/16 1743 98 F (36.7 C)     Temp src --      SpO2 10/22/16 1743 99 %     Weight 10/22/16 1745 239 lb (108.4 kg)     Height 10/22/16 1745 5\' 7"  (1.702 m)     Head Circumference --      Peak Flow --      Pain Score 10/22/16 1743 9     Pain Loc --      Pain Edu? --      Excl. in [place]? --     Constitutional: Alert and oriented. Well appearing and in no acute distress. Answers questions appropriately. Eyes: Conjunctivae are normal.  EOMI. No scleral icterus. Head: Atraumatic. Nose: No congestion/rhinnorhea. Mouth/Throat: Mucous membranes are moist.  Neck: No stridor.  Supple.  No JVD. No meningismus. Cardiovascular: Normal rate,  Respiratory: Normal respiratory effort.  Musculoskeletal: No LE edema. No ttp in the calves or palpable cords.  Negative Homan's sign. The patient has significant obesity of the bilateral lower extremities. She has full range of motion of the bilateral hips, left knee, and bilateral ankles without pain. She does have some discomfort with  full extension of the right knee but is able to do so. The pain is posterior and reproducible with palpation. No palpable masses. Normal DP and PT pulses bilaterally. 5 out of 5 dorsi flexion, plantar flexion, hamstring and quadriceps strength bilaterally. Normal sensation to light touch in the bilateral lower extremities. Neurologic:  A&Ox3.  Speech is clear.  Face and smile are symmetric.  EOMI.  Moves all extremities well. Skin:  Skin is warm, dry and intact. No rash noted. Psychiatric: Mood and affect are normal. Speech and behavior are normal.  Normal judgement.  ____________________________________________   LABS (all labs ordered are listed, but only abnormal results are displayed)  Labs Reviewed - No data to display ____________________________________________  EKG  Not indicated ____________________________________________  RADIOLOGY  US Venous Img Lower Unilateral Right  Result Date: 10/22/2016 CLINICAL DATA:  Right lower extremity pain, recent stem-cell infection EXAM: Right LOWER EXTREMITY VENOUS DOPPLER ULTRASOUND TECHNIQUE: Gray-scale sonography with graded compression, as well as color Doppler and duplex ultrasound were performed to evaluate the lower extremity deep venous systems from the level of the common femoral vein and including the common femoral, femoral, profunda femoral, popliteal and calf veins including the posterior tibial, peroneal and gastrocnemius veins when visible. The superficial great saphenous vein was also interrogated. Spectral Doppler was utilized to evaluate flow at rest and with distal augmentation maneuvers in the common femoral, femoral and popliteal veins. COMPARISON:  None. FINDINGS: Contralateral Common Femoral Vein: Respiratory phasicity is normal and symmetric with the symptomatic side. No evidence of thrombus. Normal compressibility. Common Femoral Vein: No evidence of thrombus. Normal compressibility, respiratory phasicity and response to  augmentation. Saphenofemoral Junction: No evidence of thrombus. Normal compressibility and flow on color Doppler imaging. Profunda Femoral Vein: No evidence of thrombus. Normal compressibility and flow on color Doppler imaging. Femoral Vein: No evidence of thrombus. Normal compressibility, respiratory phasicity and response to augmentation. Popliteal Vein: No evidence of thrombus. Normal compressibility, respiratory phasicity and response to augmentation. Calf Veins: No evidence of thrombus. Normal compressibility and flow on color Doppler imaging. Venous Reflux:  None. Other Findings: Increased echogenicity within the subcutaneous soft tissues of the posterior, lateral aspect of right knee consistent with nonspecific edema. IMPRESSION: No evidence of DVT within the right lower extremity. Electronically Signed   By: Donavan Foil M.D.   On: 10/22/2016 18:39    ____________________________________________   PROCEDURES  Procedure(s) performed: None  Procedures  Critical Care performed: No ____________________________________________   INITIAL IMPRESSION / ASSESSMENT AND PLAN / ED COURSE  Pertinent labs & imaging results that were available during my care of the patient were reviewed by me and considered in my medical decision making (see chart for details).  61 y.o. female with recent stem cell injection into the bilateral knees presenting with posterior knee pain and inability to fully extend. The patient has an ultrasound which does not show a DVT. She has not some nonspecific edema. On my examination, there is no evidence of septic arthritis. The patient may be having a localized reaction to her recent injection. I do not see any evidence of bony injury. At this time, the patient is stable for discharge. She will continue cryotherapy, and she may wear her brace for support but I have discussed precautions about coming out of the brace once hourly to prevent stiffness. The patient will take  Tylenol, and I have given her prescription for oxycodone as her Workmen's Comp. did not approve Percocet. Plan discharge at this time. Return precautions as well as follow-up instructions were discussed  ____________________________________________  FINAL CLINICAL IMPRESSION(S) / ED DIAGNOSES  Final diagnoses:  Acute pain of right knee         NEW MEDICATIONS STARTED DURING THIS VISIT:  New Prescriptions   OXYCODONE (ROXICODONE) 5 MG IMMEDIATE RELEASE TABLET    Take 1 tablet (5 mg total) by mouth every 6 (six) hours as needed for severe pain.      Eula Listen, MD 10/22/16 2103

## 2016-10-22 NOTE — ED Triage Notes (Signed)
States had bilateral stem cell injections in knees 5 days ago. Today unable to extend R knee. Called MD and he wished her to be ruled out for DVT.

## 2016-10-24 ENCOUNTER — Ambulatory Visit
Admission: RE | Admit: 2016-10-24 | Discharge: 2016-10-24 | Disposition: A | Discharge status: Home / Self Care | Payer: BC Managed Care – PPO | Source: Ambulatory Visit | Attending: Family Medicine | Admitting: Family Medicine

## 2016-10-24 DIAGNOSIS — E348 Other specified endocrine disorders: Secondary | ICD-10-CM

## 2016-11-16 ENCOUNTER — Other Ambulatory Visit: Payer: Self-pay | Admitting: Family Medicine

## 2016-11-30 ENCOUNTER — Other Ambulatory Visit: Payer: Self-pay | Admitting: Family Medicine

## 2016-11-30 DIAGNOSIS — E2839 Other primary ovarian failure: Secondary | ICD-10-CM

## 2016-12-03 ENCOUNTER — Other Ambulatory Visit: Payer: Self-pay | Admitting: Surgery

## 2016-12-03 DIAGNOSIS — Z903 Acquired absence of stomach [part of]: Secondary | ICD-10-CM

## 2016-12-04 ENCOUNTER — Ambulatory Visit
Admission: RE | Admit: 2016-12-04 | Discharge: 2016-12-04 | Disposition: A | Discharge status: Home / Self Care | Payer: BC Managed Care – PPO | Source: Ambulatory Visit | Attending: Family Medicine | Admitting: Family Medicine

## 2016-12-04 ENCOUNTER — Encounter: Payer: Self-pay | Admitting: Family Medicine

## 2016-12-04 ENCOUNTER — Other Ambulatory Visit: Payer: Self-pay

## 2016-12-04 ENCOUNTER — Other Ambulatory Visit: Payer: Self-pay | Admitting: Family Medicine

## 2016-12-04 ENCOUNTER — Ambulatory Visit
Admission: RE | Admit: 2016-12-04 | Discharge: 2016-12-04 | Disposition: A | Discharge status: Home / Self Care | Payer: BC Managed Care – PPO | Source: Ambulatory Visit | Attending: Surgery | Admitting: Surgery

## 2016-12-04 DIAGNOSIS — Z1231 Encounter for screening mammogram for malignant neoplasm of breast: Secondary | ICD-10-CM

## 2016-12-04 DIAGNOSIS — Z903 Acquired absence of stomach [part of]: Secondary | ICD-10-CM

## 2016-12-04 DIAGNOSIS — E2839 Other primary ovarian failure: Secondary | ICD-10-CM

## 2016-12-04 DIAGNOSIS — M810 Age-related osteoporosis without current pathological fracture: Secondary | ICD-10-CM | POA: Insufficient documentation

## 2016-12-07 ENCOUNTER — Ambulatory Visit (INDEPENDENT_AMBULATORY_CARE_PROVIDER_SITE_OTHER): Payer: BC Managed Care – PPO | Admitting: Family Medicine

## 2016-12-07 ENCOUNTER — Encounter: Payer: Self-pay | Admitting: Family Medicine

## 2016-12-07 DIAGNOSIS — M810 Age-related osteoporosis without current pathological fracture: Secondary | ICD-10-CM | POA: Diagnosis not present

## 2016-12-07 NOTE — Assessment & Plan Note (Signed)
Contraindications for oral bisphosphonate... Severe reflux, moderate hiatal hernia and past bariatric surgery.  Discussed options of prolia and IV bisphosphonate. Pt will consider.  She will work on lifestyle changes.. Weight bearing exercise, ca and vit D.  Consider recheck in 1-2 years.

## 2016-12-07 NOTE — Progress Notes (Signed)
   Subjective:    Patient ID: Kimberly Patton, female    DOB: 1955-06-16, 61 y.o.   MRN: [id number]  HPI    61 year old female presents to discuss  New  diagnosisosteoporosis.  T-2.7 hip, new dx 2018  She has   GERD and is on protonix. Poorly controlled reflux and dysphagia, trouble eating due to this. Has gastric sleeve.  Recent upper  GI with barium showed hiatal hernia.    She is exercising in water, low impact given knee issues.  Taking vit D 400 IU daily.  She drinks a lot of skim milk. NMl GRF.   Review of Systems  Constitutional: Negative for fatigue and fever.  HENT: Negative for ear pain.   Eyes: Negative for pain.  Respiratory: Negative for chest tightness and shortness of breath.   Cardiovascular: Negative for chest pain, palpitations and leg swelling.  Gastrointestinal: Negative for abdominal pain.  Genitourinary: Negative for dysuria.       Objective:   Physical Exam  Constitutional: Vital signs are normal. She appears well-developed and well-nourished. She is cooperative.  Non-toxic appearance. She does not appear ill. No distress.  Obese female in NAD  HENT:  Head: Normocephalic.  Right Ear: Hearing, tympanic membrane, external ear and ear canal normal. Tympanic membrane is not erythematous, not retracted and not bulging.  Left Ear: Hearing, tympanic membrane, external ear and ear canal normal. Tympanic membrane is not erythematous, not retracted and not bulging.  Nose: No mucosal edema or rhinorrhea. Right sinus exhibits no maxillary sinus tenderness and no frontal sinus tenderness. Left sinus exhibits no maxillary sinus tenderness and no frontal sinus tenderness.  Mouth/Throat: Uvula is midline, oropharynx is clear and moist and mucous membranes are normal.  Eyes: Conjunctivae, EOM and lids are normal. Pupils are equal, round, and reactive to light. Lids are everted and swept, no foreign bodies found.  Neck: Trachea normal and normal range of motion. Neck  supple. Carotid bruit is not present. No thyroid mass and no thyromegaly present.  Cardiovascular: Normal rate, regular rhythm, S1 normal, S2 normal, normal heart sounds, intact distal pulses and normal pulses.  Exam reveals no gallop and no friction rub.   No murmur heard. Pulmonary/Chest: Effort normal and breath sounds normal. No tachypnea. No respiratory distress. She has no decreased breath sounds. She has no wheezes. She has no rhonchi. She has no rales.  Abdominal: Soft. Normal appearance and bowel sounds are normal. There is no tenderness.  Neurological: She is alert.  Skin: Skin is warm, dry and intact. No rash noted.  Psychiatric: Her speech is normal and behavior is normal. Judgment and thought content normal. Her mood appears not anxious. Cognition and memory are normal. She does not exhibit a depressed mood.          Assessment & Plan:

## 2016-12-07 NOTE — Patient Instructions (Addendum)
Contact our office when ready to reconsider treatment of osteoporosis.  Recommend weight bearing exercise, calcium in diet and vit D supplement 400 IU 1-2 times daily. Calcium 600 twice daily if not getting in diet.

## 2017-01-04 ENCOUNTER — Ambulatory Visit
Admission: RE | Admit: 2017-01-04 | Discharge: 2017-01-04 | Disposition: A | Discharge status: Home / Self Care | Payer: BC Managed Care – PPO | Source: Ambulatory Visit | Attending: Family Medicine | Admitting: Family Medicine

## 2017-01-04 DIAGNOSIS — Z1231 Encounter for screening mammogram for malignant neoplasm of breast: Secondary | ICD-10-CM

## 2017-01-18 ENCOUNTER — Other Ambulatory Visit: Payer: Self-pay | Admitting: Surgery

## 2017-01-18 DIAGNOSIS — Z85820 Personal history of malignant melanoma of skin: Secondary | ICD-10-CM

## 2017-01-18 DIAGNOSIS — K449 Diaphragmatic hernia without obstruction or gangrene: Secondary | ICD-10-CM

## 2017-01-21 ENCOUNTER — Ambulatory Visit
Admission: RE | Admit: 2017-01-21 | Discharge: 2017-01-21 | Disposition: A | Discharge status: Home / Self Care | Payer: BC Managed Care – PPO | Source: Ambulatory Visit | Attending: Surgery | Admitting: Surgery

## 2017-01-21 DIAGNOSIS — K449 Diaphragmatic hernia without obstruction or gangrene: Secondary | ICD-10-CM

## 2017-01-21 DIAGNOSIS — Z85820 Personal history of malignant melanoma of skin: Secondary | ICD-10-CM

## 2017-01-21 MED ORDER — IOPAMIDOL (ISOVUE-300) INJECTION 61%
125.0000 mL | Freq: Once | INTRAVENOUS | Status: AC | PRN
  Administered 2017-01-21: 125 mL via INTRAVENOUS

## 2017-01-23 ENCOUNTER — Other Ambulatory Visit: Payer: Self-pay | Admitting: Family Medicine

## 2017-01-23 MED ORDER — HYDROCHLOROTHIAZIDE 25 MG PO TABS: 25.0000 mg | ORAL_TABLET | Freq: Every day | ORAL | 1 refills | Status: DC

## 2017-01-23 NOTE — Telephone Encounter (Signed)
Received a [facility] from [facility] [address] stating pt is switching to their pharmacy and that pt needs refills of her hydrochlorothiazide. Called pt and she verified that she did move and that she would prefer we send her refills to this pharmacy. Refills were sent. Pt had no additional questions at this time. Nothing further is needed

## 2017-02-01 ENCOUNTER — Telehealth: Payer: Self-pay | Admitting: Family Medicine

## 2017-02-01 NOTE — Telephone Encounter (Signed)
Patient Name: Kimberly Patton DOB: 1956/01/11 Initial Comment cut finger on left finger by thumb, bleeding has not stopped, over 6 mins, adding pressure Nurse Assessment Nurse: Kathi Ludwig, RN, Leana Roe Date/Time (Eastern Time): 02/01/2017 10:04:03 AM Confirm and document reason for call. If symptomatic, describe symptoms. ---Caller states cut finger on left finger by thumb, bleeding has not stopped, over 6 mins, adding pressure. Skin looks like it is pulled away. Weed trimmer cut Does the patient have any new or worsening symptoms? ---Yes Will a triage be completed? ---Yes Related visit to physician within the last 2 weeks? ---No Does the PT have any chronic conditions? (i.e. diabetes, asthma, etc.) ---Yes List chronic conditions. ---HTN Is this a behavioral health or substance abuse call? ---No Guidelines Guideline Title Affirmed Question Affirmed Notes Finger Injury Skin is split open or gaping (or length > 1/2 inch or 12 mm) Final Disposition User Go to ED Now Kathi Ludwig, RN, Tracie Comments Patient states she can't afford ER and wants to know if office does sutures. Call placed to office voice mail left at [facility] triage nurse with patient info and phone number. No appointments available at [facility] or [facility]. Patient hung up when info given Referrals GO TO FACILITY REFUSED Disagree/Comply: Disagree Disagree/Comply Reason: Disagree with instructions

## 2017-02-01 NOTE — Telephone Encounter (Signed)
Tdap is documented already.

## 2017-02-01 NOTE — Telephone Encounter (Signed)
Patient walked into office holding left index finger and bleeding, patient stated she cut her finger on a hedge trimmer, patient was triaged and bleeding was stopped using height and pressure , patient vitals were taken BP 128/90 pulse 84 02 sat @ 98 patient last TD was in 2016 patient had Tdap . After stabilizing and having MD see finger provider stated patient could be sen in office but best avenue would be for patient to be seen at urgent care. Patient agreed to go to Urgent care.

## 2017-02-01 NOTE — Telephone Encounter (Signed)
Left v/m per DPR that pt had tdap on 01/20/2015.

## 2017-02-01 NOTE — Telephone Encounter (Signed)
FYI.. Add if not in computer

## 2017-02-01 NOTE — Telephone Encounter (Signed)
Kimberly Mackie RN with [facility] left v/m that pt refused to go to ED for cut on finger. I called pt and she is on her way to [facility] walkin now. Nothing further needed.

## 2017-03-22 ENCOUNTER — Telehealth: Payer: Self-pay

## 2017-03-22 ENCOUNTER — Ambulatory Visit (INDEPENDENT_AMBULATORY_CARE_PROVIDER_SITE_OTHER): Payer: BC Managed Care – PPO | Admitting: Family Medicine

## 2017-03-22 ENCOUNTER — Encounter: Payer: Self-pay | Admitting: Family Medicine

## 2017-03-22 VITALS — BP 90/60 | HR 89 | Temp 98.4°F | Ht 66.0 in | Wt 248.2 lb

## 2017-03-22 DIAGNOSIS — J069 Acute upper respiratory infection, unspecified: Secondary | ICD-10-CM | POA: Diagnosis not present

## 2017-03-22 DIAGNOSIS — B9789 Other viral agents as the cause of diseases classified elsewhere: Secondary | ICD-10-CM

## 2017-03-22 DIAGNOSIS — Z23 Encounter for immunization: Secondary | ICD-10-CM | POA: Diagnosis not present

## 2017-03-22 DIAGNOSIS — J4520 Mild intermittent asthma, uncomplicated: Secondary | ICD-10-CM | POA: Diagnosis not present

## 2017-03-22 MED ORDER — BENZONATATE 200 MG PO CAPS: 200.0000 mg | ORAL_CAPSULE | Freq: Two times a day (BID) | ORAL | 0 refills | Status: DC | PRN

## 2017-03-22 MED ORDER — ALBUTEROL SULFATE HFA 108 (90 BASE) MCG/ACT IN AERS: 2.0000 | INHALATION_SPRAY | Freq: Four times a day (QID) | RESPIRATORY_TRACT | 0 refills | Status: DC | PRN

## 2017-03-22 MED ORDER — GUAIFENESIN-CODEINE 100-10 MG/5ML PO SYRP: 5.0000 mL | ORAL_SOLUTION | Freq: Every evening | ORAL | 0 refills | Status: DC | PRN

## 2017-03-22 NOTE — Telephone Encounter (Signed)
Pt checked with [facility] and they did not get rx for tessalon or albuterol inhaler. Summer said they just got their power back on and did not get meds electronically. Medication phoned to [facility] at [facility] as instructed. Pt will wait til 5 pm to pick up. Pt voiced understanding.

## 2017-03-22 NOTE — Patient Instructions (Signed)
Benzonatate for daytime cough.  Cough suppressant at night.  Call if new fever, shortness of breath,  Or not improving as expected.

## 2017-03-22 NOTE — Progress Notes (Signed)
   Subjective:    Patient ID: Kimberly Patton, female    DOB: [birth year], 61 y.o.   MRN: [id number]  Cough  This is a new problem. The current episode started 1 to 4 weeks ago (10 days). The problem has been gradually worsening. The cough is productive of sputum. Pertinent negatives include no chills, ear congestion, ear pain, fever, nasal congestion, postnasal drip, sore throat, shortness of breath or wheezing. The symptoms are aggravated by lying down (keeping her up at night.. post tussive emesis). Risk factors: nonsmoker. Treatments tried: coricidin. Her past medical history is significant for asthma. There is no history of bronchitis, COPD, environmental allergies or pneumonia.    Blood pressure 90/60, pulse 89, temperature 98.4 F (36.9 C), temperature source Oral, height 5\' 6"  (1.676 m), weight 248 lb 4 oz (112.6 kg), SpO2 98 %.   Review of Systems  Constitutional: Negative for chills and fever.  HENT: Negative for ear pain, postnasal drip and sore throat.   Respiratory: Positive for cough. Negative for shortness of breath and wheezing.   Allergic/Immunologic: Negative for environmental allergies.       Objective:   Physical Exam  Constitutional: Vital signs are normal. She appears well-developed and well-nourished. She is cooperative.  Non-toxic appearance. She does not appear ill. No distress.  HENT:  Head: Normocephalic.  Right Ear: Hearing, tympanic membrane, external ear and ear canal normal. Tympanic membrane is not erythematous, not retracted and not bulging.  Left Ear: Hearing, tympanic membrane, external ear and ear canal normal. Tympanic membrane is not erythematous, not retracted and not bulging.  Nose: Mucosal edema and rhinorrhea present. Right sinus exhibits no maxillary sinus tenderness and no frontal sinus tenderness. Left sinus exhibits no maxillary sinus tenderness and no frontal sinus tenderness.  Mouth/Throat: Uvula is midline, oropharynx is clear and moist and  mucous membranes are normal.  Eyes: Pupils are equal, round, and reactive to light. Conjunctivae, EOM and lids are normal. Lids are everted and swept, no foreign bodies found.  Neck: Trachea normal and normal range of motion. Neck supple. Carotid bruit is not present. No thyroid mass and no thyromegaly present.  Cardiovascular: Normal rate, regular rhythm, S1 normal, S2 normal, normal heart sounds, intact distal pulses and normal pulses.  Exam reveals no gallop and no friction rub.   No murmur heard. Pulmonary/Chest: Effort normal and breath sounds normal. No tachypnea. No respiratory distress. She has no decreased breath sounds. She has no wheezes. She has no rhonchi. She has no rales.  Neurological: She is alert.  Skin: Skin is warm, dry and intact. No rash noted.  Psychiatric: Her speech is normal and behavior is normal. Judgment normal. Her mood appears not anxious. Cognition and memory are normal. She does not exhibit a depressed mood.          Assessment & Plan:

## 2017-03-22 NOTE — Assessment & Plan Note (Signed)
Symptomatic care. Cough suppressants.  No current asthma flare.

## 2017-03-22 NOTE — Addendum Note (Signed)
Addended by: Carter Kitten on: 03/22/2017 11:36 AM   Modules accepted: Orders

## 2017-03-26 ENCOUNTER — Encounter: Payer: Self-pay | Admitting: Family Medicine

## 2017-03-26 MED ORDER — AZITHROMYCIN 250 MG PO TABS: ORAL_TABLET | ORAL | 0 refills | Status: DC

## 2017-03-27 ENCOUNTER — Other Ambulatory Visit: Payer: Self-pay | Admitting: Family Medicine

## 2017-03-27 NOTE — Telephone Encounter (Signed)
Last office visit 03/22/2017.  Last refilled 03/22/2017 for #20 with no refills.  Ok to refill?

## 2017-04-03 ENCOUNTER — Encounter: Payer: Self-pay | Admitting: Family Medicine

## 2017-04-04 ENCOUNTER — Encounter: Payer: Self-pay | Admitting: Family Medicine

## 2017-04-04 ENCOUNTER — Ambulatory Visit: Payer: Self-pay | Admitting: *Deleted

## 2017-04-04 NOTE — Telephone Encounter (Signed)
, °

## 2017-04-04 NOTE — Telephone Encounter (Signed)
  Reason for Disposition . SEVERE coughing spells (e.g., whooping sound after coughing, vomiting after coughing)  Answer Assessment - Initial Assessment Questions 1. ONSET: "When did the cough begin?"      A couple of week, previously treated for cough 2. SEVERITY: "How bad is the cough today?"      Dry hacking cough constant 3. RESPIRATORY DISTRESS: "Describe your breathing."      No distress 4. FEVER: "Do you have a fever?" If so, ask: "What is your temperature, how was it measured, and when did it start?"     no 5. HEMOPTYSIS: "Are you coughing up any blood?" If so ask: "How much?" (flecks, streaks, tablespoons, etc.)     no 6. TREATMENT: "What have you done so far to treat the cough?" (e.g., meds, fluids, humidifier)     Tessalon pearls-has not worked 7. CARDIAC HISTORY: "Do you have any history of heart disease?" (e.g., heart attack, congestive heart failure)     no 8. LUNG HISTORY: "Do you have any history of lung disease?"  (e.g., pulmonary embolus, asthma, emphysema)   Hx of asthma-has not had any problems in 12-13years 9. PE RISK FACTORS: "Do you have a history of blood clots?" (or: recent major surgery, recent prolonged travel, bedridden )     no 10. OTHER SYMPTOMS: "Do you have any other symptoms? (e.g., runny nose, wheezing, chest pain)  Throat hurting from coughing so hard        11. TRAVEL: "Have you traveled out of the country in the last month?" (e.g., travel history, exposures)      no  Protocols used: COUGH - ACUTE NON-PRODUCTIVE-A-AH

## 2017-04-05 MED ORDER — PREDNISONE 10 MG PO TABS: ORAL_TABLET | ORAL | 0 refills | Status: DC

## 2017-04-05 MED ORDER — FLUTICASONE PROPIONATE 50 MCG/ACT NA SUSP: 2.0000 | Freq: Every day | NASAL | 6 refills | Status: DC

## 2017-04-05 NOTE — Telephone Encounter (Signed)
Spoke with Ms. Lovena Le.  She is okay with low dose of prednisone.  She also ask that we send in flonase as a prescription so her insurance will cover.

## 2017-04-05 NOTE — Addendum Note (Signed)
Addended by: Carter Kitten on: 04/05/2017 02:23 PM   Modules accepted: Orders

## 2017-04-05 NOTE — Telephone Encounter (Signed)
Sounds like it is now an asthma exacerbation.. I would like send in a course of prednisone... What was issue in past.. Can she tolerate at least low dose taper?  She was already treated with antibiotics. Azithromycin.. Still in system.   If she refuses prednisone.. Then she probably needs an appt to be re-seen.

## 2017-04-05 NOTE — Addendum Note (Signed)
Addended by: [name] E on: 04/05/2017 05:26 PM   Modules accepted: Orders

## 2017-04-14 ENCOUNTER — Other Ambulatory Visit: Payer: Self-pay | Admitting: Family Medicine

## 2017-04-24 ENCOUNTER — Other Ambulatory Visit: Payer: Self-pay | Admitting: Family Medicine

## 2017-04-29 ENCOUNTER — Other Ambulatory Visit (HOSPITAL_COMMUNITY): Payer: Self-pay | Admitting: Orthopaedic Surgery

## 2017-04-29 DIAGNOSIS — M79605 Pain in left leg: Secondary | ICD-10-CM

## 2017-04-29 DIAGNOSIS — M7989 Other specified soft tissue disorders: Secondary | ICD-10-CM

## 2017-04-30 ENCOUNTER — Ambulatory Visit (HOSPITAL_COMMUNITY)
Admission: RE | Admit: 2017-04-30 | Discharge: 2017-04-30 | Disposition: A | Discharge status: Home / Self Care | Payer: Worker's Compensation | Source: Ambulatory Visit | Attending: Family Medicine | Admitting: Family Medicine

## 2017-04-30 ENCOUNTER — Encounter: Payer: Self-pay | Admitting: Family Medicine

## 2017-04-30 DIAGNOSIS — M7989 Other specified soft tissue disorders: Secondary | ICD-10-CM | POA: Insufficient documentation

## 2017-04-30 DIAGNOSIS — M25562 Pain in left knee: Secondary | ICD-10-CM | POA: Diagnosis present

## 2017-04-30 DIAGNOSIS — M79605 Pain in left leg: Secondary | ICD-10-CM | POA: Diagnosis not present

## 2017-04-30 NOTE — Progress Notes (Signed)
Left lower extremyiy venous duplex completed. No evidence of DVT, superficial thrombosis, or Baker's cyst.  Toma Copier, RVS 04/30/2017, 4:43 PM

## 2017-05-01 ENCOUNTER — Ambulatory Visit (INDEPENDENT_AMBULATORY_CARE_PROVIDER_SITE_OTHER): Payer: Worker's Compensation | Admitting: Family Medicine

## 2017-05-01 ENCOUNTER — Encounter: Payer: Self-pay | Admitting: Family Medicine

## 2017-05-01 VITALS — BP 108/64 | HR 59 | Temp 97.8°F | Wt 244.5 lb

## 2017-05-01 DIAGNOSIS — S46212A Strain of muscle, fascia and tendon of other parts of biceps, left arm, initial encounter: Secondary | ICD-10-CM

## 2017-05-01 DIAGNOSIS — M7989 Other specified soft tissue disorders: Secondary | ICD-10-CM

## 2017-05-01 DIAGNOSIS — R0789 Other chest pain: Secondary | ICD-10-CM

## 2017-05-01 NOTE — Patient Instructions (Signed)
   Please try heat and range of motion

## 2017-05-01 NOTE — Progress Notes (Signed)
Subjective:    Patient ID: Kimberly Patton, female    DOB: Nov 04, 1955, 61 y.o.   MRN: [id number]  HPI This is a 61 yo female who has had increased left knee swelling for several weeks, worse after standing all day. She notified Dr. Rhona Raider who ordered a doppler US which she had done yesterday, it was negative. By end of day left leg is always more swollen. [place] tech told her she has good circulation up and down. Today she has left posterior thigh pain. She is currently undergoing stem cell therapy for her left leg swelling.  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  If she is not on her feet she does not experience swelling.  She does wear compression stockings and finds that this helps some but she continues to have swelling above and below stockings they dig into her legs.  She has been told that she will need a left knee replacement if swelling continues and is not improved with stem cell therapy.   Three days ago, she had left shoulder and arm pain and went to UC in [place]. EKG, VS were normal. She continued to have pain in her left bicep and chest. Chest pain is pressure that lasts for 30-45 minutes. Left arm was sore for two days. Has had mild SOB, none today.  No shoulder or elbow pain.  Prior to pain starting she did put up Christmas decorations.  She has not tried anything for pain.  Past Medical History:  Diagnosis Date  . ALLERGIC RHINITIS 04/03/2007  . ASTHMA, PERSISTENT, MODERATE 04/03/2007  . Dyspnea on exertion    a. 11/2007 Echo: EF 60%.  Marland Kitchen GERD (gastroesophageal reflux disease)   . 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 Hypertension   . Joint pain    HIPS / LEGS  . Knee injury    RT  . Meningioma ([place])   . Morbid obesity ([place])   . Paroxysmal SVT (supraventricular tachycardia) ([place])    a. 11/2011 48h Holter: RSR, rare PVC's, occas PAC's  . Retinal tear of right eye 01/2016  . Ventral hernia    Past Surgical History:  Procedure Laterality Date  . APPENDECTOMY   1996  . BREAST CYST EXCISION Right 1994   Benign  . CESAREAN SECTION     x2  . CHOLECYSTECTOMY  1995  . EYE SURGERY  01/2016   Repair retinal tear  . [place] / 1996   x2  . KNEE ARTHROSCOPY W/ MENISCAL REPAIR  07/25/13   right knee Dr. Mardelle Matte  . LAPAROSCOPIC GASTRIC SLEEVE RESECTION N/A 06/08/2013   Procedure: LAPAROSCOPIC GASTRIC SLEEVE RESECTION AND EXCISION OF SEBACEUS CYST FROM MID CHEST takedown of incarcerated ventral hernia and primary repair endoscopy;  Surgeon: [name], MD;  Location: [facility];  Service: General;  Laterality: N/A;  . moles  06/2013   removed 2 moles from under arm and  lowback  . OVARIAN CYST REMOVAL  1970  . PILONIDAL CYST EXCISION  1975  . RETINAL TEAR REPAIR CRYOTHERAPY Right 01/2016   [place]  . SKIN CANCER EXCISION    . TONSILLECTOMY    . UPPER GI ENDOSCOPY  06/08/2013   Procedure: UPPER GI ENDOSCOPY;  Surgeon: [name], MD;  Location: [facility];  Service: General;;  . US ECHOCARDIOGRAPHY  12/2011   WNL - EF 55-60%, mild MR, grade 1 diastolic dysnfiction (mild)  . VENTRAL HERNIA REPAIR  2000   Family History  Problem Relation Age of Onset  . Breast cancer Mother   . Diabetes Mother   . Hypertension Mother   . Kidney failure Mother   . Diabetes Father   . Hypertension Father   . Diabetes Unknown   . Hypertension Unknown   . Stroke Unknown   . Hemophilia Unknown    Social History   Tobacco Use  . Smoking status: Never Smoker  . Smokeless tobacco: Never Used  . Tobacco comment: smoke at age [age]  Substance Use Topics  . Alcohol use: Yes    Alcohol/week: 0.0 oz    Comment: Occasionally  . Drug use: No      Review of Systems Per HPI    Objective:   Physical Exam  Constitutional: She appears well-developed and well-nourished. No distress.  obese  HENT:  Head: Normocephalic and atraumatic.  Eyes: Conjunctivae are normal.  Cardiovascular: Normal rate, regular rhythm and normal heart sounds.  Pulmonary/Chest:  Effort normal and breath sounds normal. Tenderness: exquisitely tender over anterior chest, no rash or erythema.  Musculoskeletal:       Left knee: She exhibits swelling (?mild). Tenderness found. Medial joint line and patellar tendon tenderness noted.  Left shoulder and left elbow with normal ROM. Bicep tender to palpation. Trace pretibial edema bilaterally. Good pulses, normal color, temperature.    Skin: She is not diaphoretic.  Vitals reviewed.     BP 108/64 (BP Location: Right Arm, Patient Position: Sitting, Cuff Size: Large)   Pulse (!) 59   Temp 97.8 F (36.6 C) (Oral)   Wt 244 lb 8 oz (110.9 kg)   SpO2 97%   BMI 39.46 kg/m  Wt Readings from Last 3 Encounters:  05/01/17 244 lb 8 oz (110.9 kg)  03/22/17 248 lb 4 oz (112.6 kg)  12/07/16 241 lb 8 oz (109.5 kg)       Assessment & Plan:  1. Left leg swelling - seems to be originating in knee with prolonged standing, negative Doppler US - encouraged her to continue compression stockings, elevate when able, she has another stem cell treatment and follow up with ortho  2. Chest wall pain - likely related to recent repetitive motion/overuse, encouraged NSAIDs, heat, gentle ROM  3. Strain of left biceps, initial encounter - see #2  - RTC if no improvement in 5-7 days or if worsening or new symptoms  Clarene Reamer, FNP-BC   Primary [facility] at [facility]  05/03/2017 10:53 AM

## 2017-05-03 ENCOUNTER — Encounter: Payer: Self-pay | Admitting: Family Medicine

## 2017-05-05 ENCOUNTER — Emergency Department
Admission: EM | Admit: 2017-05-05 | Discharge: 2017-05-05 | Disposition: A | Discharge status: Home / Self Care | Payer: BC Managed Care – PPO | Attending: Emergency Medicine | Admitting: Emergency Medicine

## 2017-05-05 ENCOUNTER — Emergency Department: Payer: BC Managed Care – PPO

## 2017-05-05 ENCOUNTER — Encounter: Payer: Self-pay | Admitting: Emergency Medicine

## 2017-05-05 DIAGNOSIS — R002 Palpitations: Secondary | ICD-10-CM | POA: Diagnosis not present

## 2017-05-05 DIAGNOSIS — Z79899 Other long term (current) drug therapy: Secondary | ICD-10-CM | POA: Insufficient documentation

## 2017-05-05 DIAGNOSIS — E876 Hypokalemia: Secondary | ICD-10-CM | POA: Diagnosis not present

## 2017-05-05 DIAGNOSIS — I10 Essential (primary) hypertension: Secondary | ICD-10-CM | POA: Diagnosis not present

## 2017-05-05 DIAGNOSIS — J4540 Moderate persistent asthma, uncomplicated: Secondary | ICD-10-CM | POA: Insufficient documentation

## 2017-05-05 DIAGNOSIS — R079 Chest pain, unspecified: Secondary | ICD-10-CM | POA: Diagnosis not present

## 2017-05-05 LAB — BASIC METABOLIC PANEL
Anion gap: 12 (ref 5–15)
BUN: 17 mg/dL (ref 6–20)
CO2: 28 mmol/L (ref 22–32)
Calcium: 9.1 mg/dL (ref 8.9–10.3)
Chloride: 99 mmol/L — ABNORMAL LOW (ref 101–111)
Creatinine, Ser: 0.5 mg/dL (ref 0.44–1.00)
GFR calc Af Amer: >60 mL/min (ref >60)
GFR calc non Af Amer: >60 mL/min (ref >60)
Glucose, Bld: 93 mg/dL (ref 65–99)
Potassium: 2.9 mmol/L — ABNORMAL LOW (ref 3.5–5.1)
Sodium: 139 mmol/L (ref 135–145)

## 2017-05-05 LAB — CBC
HCT: 36.6 % (ref 35.0–47.0)
Hemoglobin: 12.3 g/dL (ref 12.0–16.0)
MCH: 25.6 pg — ABNORMAL LOW (ref 26.0–34.0)
MCHC: 33.6 g/dL (ref 32.0–36.0)
MCV: 76.2 fL — ABNORMAL LOW (ref 80.0–100.0)
Platelets: 334 10*3/uL (ref 150–440)
RBC: 4.8 MIL/uL (ref 3.80–5.20)
RDW: 15.7 % — ABNORMAL HIGH (ref 11.5–14.5)
WBC: 6 10*3/uL (ref 3.6–11.0)

## 2017-05-05 LAB — TROPONIN I
Troponin I: <0.03 ng/mL (ref <0.03)
Troponin I: <0.03 ng/mL (ref <0.03)

## 2017-05-05 LAB — MAGNESIUM: Magnesium: 2.1 mg/dL (ref 1.7–2.4)

## 2017-05-05 LAB — TSH: TSH: 1.428 u[IU]/mL (ref 0.350–4.500)

## 2017-05-05 MED ORDER — POTASSIUM CHLORIDE ER 10 MEQ PO TBCR: 10.0000 meq | EXTENDED_RELEASE_TABLET | Freq: Every day | ORAL | 1 refills | Status: DC

## 2017-05-05 MED ORDER — POTASSIUM CHLORIDE CRYS ER 20 MEQ PO TBCR
40.0000 meq | EXTENDED_RELEASE_TABLET | Freq: Once | ORAL | Status: AC
  Administered 2017-05-05: 40 meq via ORAL
  Filled 2017-05-05: qty 2

## 2017-05-05 MED ORDER — IOPAMIDOL (ISOVUE-370) INJECTION 76%
75.0000 mL | Freq: Once | INTRAVENOUS | Status: AC | PRN
  Administered 2017-05-05: 75 mL via INTRAVENOUS

## 2017-05-05 MED ORDER — POTASSIUM CHLORIDE 10 MEQ/100ML IV SOLN
10.0000 meq | Freq: Once | INTRAVENOUS | Status: AC
  Administered 2017-05-05: 10 meq via INTRAVENOUS
  Filled 2017-05-05: qty 100

## 2017-05-05 MED ORDER — SODIUM CHLORIDE 0.9 % IV SOLN
Freq: Once | INTRAVENOUS | Status: AC
  Administered 2017-05-05: 06:00:00 via INTRAVENOUS

## 2017-05-05 NOTE — ED Provider Notes (Signed)
[facility] Emergency Department Provider Note  ____________________________________________  Time seen: Approximately 5:52 AM  I have reviewed the triage vital signs and the nursing notes.   HISTORY  Chief Complaint Chest Pain   HPI CASSIDI MODESITT is a 61 y.o. female with h/o asthma, HTN, obesity who presents for evaluation of CP and palpitations. Patient reports several daily episodes of palpitation associated with chest pain which she describes as chest pressure in the center of her chest associated with SOB. The episodes last 20-30 min and resolve with no intervention. This has been ongoing for 1 week. She has also noticed that her L leg was more swollen than the right and 4 days ago had doppler of her legs which was negative. No personal or family history of blood clots or ischemic heart disease. Patient is not a smoker. At this time patient is complaining of mild pain that is nonradiating and located in the center of her chest. No URI symptoms. No history of thyroid disease.  Past Medical History:  Diagnosis Date  . ALLERGIC RHINITIS 04/03/2007  . ASTHMA, PERSISTENT, MODERATE 04/03/2007  . Dyspnea on exertion    a. 11/2007 Echo: EF 60%.  Marland Kitchen GERD (gastroesophageal reflux disease)   . 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 Hypertension   . Joint pain    HIPS / LEGS  . Knee injury    RT  . Meningioma ([place])   . Morbid obesity ([place])   . Paroxysmal SVT (supraventricular tachycardia) ([place])    a. 11/2011 48h Holter: RSR, rare PVC's, occas PAC's  . Retinal tear of right eye 01/2016  . Ventral hernia     Patient Active Problem List   Diagnosis Date Noted  . Osteoporosis 12/04/2016  . Rib pain on right side 09/11/2016  . RUQ pain 09/11/2016  . Osteoarthritis of knee 07/17/2016  . Meningioma ([place]) 01/18/2016  . Family history of aortic stenosis 01/20/2015  . Viral URI with cough 07/30/2014  . Status post laparoscopic sleeve gastrectomy Dec 2014 06/08/2013  . Body  mass index (BMI) of 36.0-36.9 in adult 12/08/2012  . HTN (hypertension) 10/11/2011  . Varicose veins of lower extremities with other complications 54/65/0354  . FIBROIDS, UTERUS 04/03/2007  . ALLERGIC RHINITIS 04/03/2007  . Asthma, mild intermittent 04/03/2007    Past Surgical History:  Procedure Laterality Date  . APPENDECTOMY  1996  . BREAST CYST EXCISION Right 1994   Benign  . CESAREAN SECTION     x2  . CHOLECYSTECTOMY  1995  . EYE SURGERY  01/2016   Repair retinal tear  . [place] / 1996   x2  . KNEE ARTHROSCOPY W/ MENISCAL REPAIR  07/25/13   right knee Dr. Mardelle Matte  . LAPAROSCOPIC GASTRIC SLEEVE RESECTION N/A 06/08/2013   Procedure: LAPAROSCOPIC GASTRIC SLEEVE RESECTION AND EXCISION OF SEBACEUS CYST FROM MID CHEST takedown of incarcerated ventral hernia and primary repair endoscopy;  Surgeon: [name], MD;  Location: [facility] ORS;  Service: General;  Laterality: N/A;  . moles  06/2013   removed 2 moles from under arm and  lowback  . OVARIAN CYST REMOVAL  1970  . PILONIDAL CYST EXCISION  1975  . RETINAL TEAR REPAIR CRYOTHERAPY Right 01/2016   [place]  . SKIN CANCER EXCISION    . TONSILLECTOMY    . UPPER GI ENDOSCOPY  06/08/2013   Procedure: UPPER GI ENDOSCOPY;  Surgeon: [name], MD;  Location: [facility] ORS;  Service: General;;  . US ECHOCARDIOGRAPHY  12/2011   WNL - EF 55-60%, mild MR, grade 1 diastolic dysnfiction (mild)  . VENTRAL HERNIA REPAIR  2000    Prior to Admission medications   Medication Sig Start Date End Date Taking? Authorizing Provider  albuterol (PROAIR HFA) 108 (90 Base) MCG/ACT inhaler TAKE 2 PUFFS BY MOUTH EVERY 6 HOURS AS NEEDED 04/14/17   Bedsole, Amy E, MD  Calcium Citrate-Vitamin D (CITRACAL + D PO) Take 2 tablets by mouth daily.     [provider]  Cyanocobalamin (VITAMIN B-12 SL) Place 1 tablet under the tongue every morning.    [provider]  diltiazem (CARDIZEM CD) 180 MG 24 hr capsule TAKE 1 CAPSULE (180 MG  TOTAL) BY MOUTH DAILY. 07/08/16   Bedsole, Amy E, MD  fluticasone (FLONASE) 50 MCG/ACT nasal spray Place 2 sprays into both nostrils daily. 04/05/17   Bedsole, Amy E, MD  hydrochlorothiazide (HYDRODIURIL) 25 MG tablet Take 1 tablet (25 mg total) by mouth daily. 01/23/17   Bedsole, Amy E, MD  pantoprazole (PROTONIX) 40 MG tablet TAKE 1 TABLET BY MOUTH EVERY DAY 04/24/17   Bedsole, Amy E, MD  potassium chloride (K-DUR) 10 MEQ tablet Take 1 tablet (10 mEq total) by mouth daily. 05/05/17   Rudene Re, MD  TURMERIC CURCUMIN PO Take 1 tablet by mouth daily.    [provider]  Vitamin D, Cholecalciferol, 400 units CAPS Take 1 capsule by mouth daily.     [provider]    Allergies Wasp venom; Prednisone; and Sodium hypochlorite  Family History  Problem Relation Age of Onset  . Breast cancer Mother   . Diabetes Mother   . Hypertension Mother   . Kidney failure Mother   . Diabetes Father   . Hypertension Father   . Diabetes Unknown   . Hypertension Unknown   . Stroke Unknown   . Hemophilia Unknown     Social History Social History   Tobacco Use  . Smoking status: Never Smoker  . Smokeless tobacco: Never Used  . Tobacco comment: smoke at age 65-12  Substance Use Topics  . Alcohol use: Yes    Alcohol/week: 0.0 oz    Comment: Occasionally  . Drug use: No    Review of Systems  Constitutional: Negative for fever. Eyes: Negative for visual changes. ENT: Negative for sore throat. Neck: No neck pain  Cardiovascular: + chest pain and palpitations Respiratory: + shortness of breath. Gastrointestinal: Negative for abdominal pain, vomiting or diarrhea. Genitourinary: Negative for dysuria. Musculoskeletal: Negative for back pain. Skin: Negative for rash. Neurological: Negative for headaches, weakness or numbness. Psych: No SI or HI  ____________________________________________   PHYSICAL EXAM:  VITAL SIGNS: ED Triage Vitals  Enc Vitals Group     BP  05/05/17 0516 117/65     Pulse Rate 05/05/17 0516 60     Resp 05/05/17 0516 18     Temp 05/05/17 0516 98.1 F (36.7 C)     Temp Source 05/05/17 0516 Oral     SpO2 05/05/17 0516 100 %     Weight 05/05/17 0513 240 lb (108.9 kg)     Height 05/05/17 0513 5\' 6"  (1.676 m)     Head Circumference --      Peak Flow --      Pain Score 05/05/17 0513 6     Pain Loc --      Pain Edu? --      Excl. in [place]? --     Constitutional: Alert and oriented. Well  appearing and in no apparent distress. HEENT:      Head: Normocephalic and atraumatic.         Eyes: Conjunctivae are normal. Sclera is non-icteric.       Mouth/Throat: Mucous membranes are moist.       Neck: Supple with no signs of meningismus. Cardiovascular: Regular rate and rhythm. No murmurs, gallops, or rubs. 2+ symmetrical distal pulses are present in all extremities. No JVD. Respiratory: Normal respiratory effort. Lungs are clear to auscultation bilaterally. No wheezes, crackles, or rhonchi.  Gastrointestinal: Soft, non tender, and non distended with positive bowel sounds. No rebound or guarding. Musculoskeletal: RLE is larger than the left with no pitting edema Neurologic: Normal speech and language. Face is symmetric. Moving all extremities. No gross focal neurologic deficits are appreciated. Skin: Skin is warm, dry and intact. No rash noted. Psychiatric: Mood and affect are normal. Speech and behavior are normal.  ____________________________________________   LABS (all labs ordered are listed, but only abnormal results are displayed)  Labs Reviewed  BASIC METABOLIC PANEL - Abnormal; Notable for the following components:      Result Value   Potassium 2.9 (*)    Chloride 99 (*)    All other components within normal limits  CBC - Abnormal; Notable for the following components:   MCV 76.2 (*)    MCH 25.6 (*)    RDW 15.7 (*)    All other components within normal limits  TROPONIN I  TSH  MAGNESIUM  TROPONIN I    ____________________________________________  EKG  ED ECG REPORT I, Rudene Re, the attending physician, personally viewed and interpreted this ECG.  05:17 - normal sinus rhythm with frequent PACs, rate of 66, low voltage QRS, incomplete RBBB, normal QTC, normal axis,no ST elevations or depressions. Unchanged from prior from January 2017  05:52 - normal sinus rhythm, rate of 61, right bundle branch block, normal QTC, normal axis, no ST elevations or depressions. Unchanged from initial. ____________________________________________  RADIOLOGY  CXR: No evidence of active pulmonary disease. Aortic atherosclerosis. Mild cardiac enlargement is unchanged.  CTA chest:  No definite evidence of pulmonary embolus.  Moderate size hiatal hernia. ____________________________________________   PROCEDURES  Procedure(s) performed: None Procedures Critical Care performed:  None ____________________________________________   INITIAL IMPRESSION / ASSESSMENT AND PLAN / ED COURSE   61 y.o. female with h/o asthma, HTN, obesity who presents for evaluation of CP and palpitations x 1 week and 3 weeks of asymmetric leg swelling. Doppler at PCP's office negative. EKG with no evidence of ischemia or arrhythmia. Will check labs for anemia, electrolyte abnormalities. Will cycle troponin. Will check CT angio to rule out PE due to asymmetric leg swelling. Will monitor on telemetry. If patient remains well appearing with negative work up will refer to cardiology for holter monitoring and further evaluation.  _________________________ 6:07 AM on 05/05/2017 -----------------------------------------  Labs showing hypokalemia which could be predisposing patient to arrhythmias. Mag level pending. Patient on HCTZ, will supplement K here and recommended PO K at home.    _________________________ 7:22 AM on 05/05/2017 -----------------------------------------  CTA negative for PE. 2nd troponin pending.  No further episodes of CP or palpitations in the ED. Plan to dc home if patient remains asymptomatic and 2nd troponin is negative to f/u with cardiology for holter monitoring. Care transferred to Dr. Reita Cliche.   As part of my medical decision making, I reviewed the following data within the [place] notes reviewed and incorporated, Labs reviewed , EKG interpreted ,  Old EKG reviewed, Old chart reviewed, Patient signed out to Dr. Reita Cliche, Radiograph reviewed , Notes from prior ED visits and [place] Controlled Substance Database    Pertinent labs & imaging results that were available during my care of the patient were reviewed by me and considered in my medical decision making (see chart for details).    ____________________________________________   FINAL CLINICAL IMPRESSION(S) / ED DIAGNOSES  Final diagnoses:  Chest pain, unspecified type  Palpitations  Hypokalemia      NEW MEDICATIONS STARTED DURING THIS VISIT:  ED Discharge Orders        Ordered    potassium chloride (K-DUR) 10 MEQ tablet  Daily     05/05/17 0727       Note:  This document was prepared using Dragon voice recognition software and may include unintentional dictation errors.    Rudene Re, MD 05/05/17 [phone]

## 2017-05-05 NOTE — ED Notes (Signed)
Pt c/o acid reflux, this RN offered to see if MD would order home meds, pt refused stating she would just take her meds when she gets home.

## 2017-05-05 NOTE — ED Notes (Signed)
This RN to bedside to introduce self to patient. Pt resting in bed with NAD. Pt c/o some heartburn, requesting something for acid reflux related to hiatal hernia. Pt noted to be alert and oriented at this time. Will continue to monitor for further patient needs.

## 2017-05-05 NOTE — ED Notes (Signed)
Pt ambulatory with steady gait to POV. Pt was in a hurry to be discharged and was standing at door waiting on discharge instructions. She did not want to go back in room to sign her instructions. Skin PWD, Resp non-labored and equal. NAD. No questions or concerns at time of discharge.

## 2017-05-05 NOTE — ED Notes (Signed)
Pt returned from ct scan, potassium and ivf restarted.

## 2017-05-05 NOTE — ED Notes (Signed)
Report to megan, rn.  

## 2017-05-05 NOTE — Discharge Instructions (Signed)

## 2017-05-05 NOTE — ED Notes (Signed)
Pt updated on treatment and evaluation plan. Pt verbalizes understanding.

## 2017-05-05 NOTE — ED Notes (Signed)
Pt up to restroom, additional warm blankets provided.

## 2017-05-05 NOTE — ED Notes (Addendum)
Pt complains of arm pain with kcl running at 44meq per hour and again at 65meq per hour. Potassium slowed to 2.5 meq/hr with relief of pain in arm.

## 2017-05-05 NOTE — ED Provider Notes (Signed)
[facility]  I accepted care from Dr. Sullivan Lone ____________________________________________    LABS (pertinent positives/negatives)  I, Lisa Roca, MD have personally reviewed the lab reports noted below.  Labs Reviewed  BASIC METABOLIC PANEL - Abnormal; Notable for the following components:      Result Value   Potassium 2.9 (*)    Chloride 99 (*)    All other components within normal limits  CBC - Abnormal; Notable for the following components:   MCV 76.2 (*)    MCH 25.6 (*)    RDW 15.7 (*)    All other components within normal limits  TROPONIN I  TSH  MAGNESIUM  TROPONIN I   ____________________________________________   PROCEDURES  Procedure(s) performed: None  Critical Care performed: None  ____________________________________________   INITIAL IMPRESSION / ASSESSMENT AND PLAN / ED COURSE   Pertinent labs & imaging results that were available during my care of the patient were reviewed by me and considered in my medical decision making (see chart for details).  Awaiting repeat troponin.  Repeat troponin came back negative less than 0.03.  Discussed with patient.  Discharge according to plan by patient and Dr. Alfred Levins.    Patient / Family / Caregiver informed of clinical course, medical decision-making process, and agree with plan.   I discussed return precautions, follow-up instructions, and discharged instructions with patient and/or family.     ____________________________________________   FINAL CLINICAL IMPRESSION(S) / ED DIAGNOSES  Final diagnoses:  Chest pain, unspecified type  Palpitations  Hypokalemia        Lisa Roca, MD 05/05/17 1137

## 2017-05-05 NOTE — ED Notes (Signed)
Pt resting in bed, NAD noted at this time. Pt finished with her potassium. C/o continued pain in her chest, pt states, "I think its from my hernia and acid reflux." Will continue to monitor for further patient needs and notify MD potassium is done.

## 2017-05-05 NOTE — ED Triage Notes (Signed)
Patient states that she has felt her heart racing times one week. Patient states that she was seen at urgent care on Sunday and was told that her EKG was normal. Patient states that this morning she was woke up by chest pressure and shortness of breath.

## 2017-05-28 ENCOUNTER — Telehealth: Payer: Self-pay | Admitting: Family Medicine

## 2017-05-28 ENCOUNTER — Ambulatory Visit: Payer: Self-pay

## 2017-05-28 ENCOUNTER — Ambulatory Visit: Payer: Worker's Compensation | Admitting: Internal Medicine

## 2017-05-28 ENCOUNTER — Ambulatory Visit (INDEPENDENT_AMBULATORY_CARE_PROVIDER_SITE_OTHER): Payer: BC Managed Care – PPO | Admitting: Family Medicine

## 2017-05-28 VITALS — BP 135/85 | HR 86 | Temp 98.4°F | Ht 66.0 in | Wt 245.4 lb

## 2017-05-28 DIAGNOSIS — R718 Other abnormality of red blood cells: Secondary | ICD-10-CM

## 2017-05-28 DIAGNOSIS — I10 Essential (primary) hypertension: Secondary | ICD-10-CM | POA: Diagnosis not present

## 2017-05-28 DIAGNOSIS — R000 Tachycardia, unspecified: Secondary | ICD-10-CM | POA: Diagnosis not present

## 2017-05-28 DIAGNOSIS — R079 Chest pain, unspecified: Secondary | ICD-10-CM

## 2017-05-28 NOTE — Telephone Encounter (Signed)
Patient called in with c/o "heart increasing up to 102 when standing up according to her fit bit. When she sits down, heart rate decreases back to 60's." Denies any adverse effects from the increased heart rate, see below assessment.  Appointment made per protocol, however patient unable to accept given appointment earlier than 4pm. Advised to call back if symptoms worsen prior to office visit.    Reason for Disposition . Age > 60 years (Exception: brief heart beat symptoms that went away and now feels well)  Answer Assessment - Initial Assessment Questions 1. DESCRIPTION: "Please describe your heart rate or heart beat that you are having" (e.g., fast/slow, regular/irregular, skipped or extra beats, "palpitations")     Increase heart rate when standing up 2. ONSET: "When did it start?" (Minutes, hours or days)     This morning at 5:30am 3. DURATION: "How long does it last" (e.g., seconds, minutes, hours)     As long as I'm standing (according to fit bit) 4. PATTERN "Does it come and go, or has it been constant since it started?"  "Does it get worse with exertion?"   "Are you feeling it now?"     Comes and goes as sit and stand. I'm pacing now and it's no worse than before. 5. TAP: "Using your hand, can you tap out what you are feeling on a chair or table in front of you, so that I can hear?" (Note: not all patients can do this)       N/A 6. HEART RATE: "Can you tell me your heart rate?" "How many beats in 15 seconds?"  (Note: not all patients can do this)       Fit bit it's 82, pacing back and forth right now. 7. RECURRENT SYMPTOM: "Have you ever had this before?" If so, ask: "When was the last time?" and "What happened that time?"      Maybe 15 yrs ago before weight loss 8. CAUSE: "What do you think is causing the palpitations?"     Unsure, maybe from glass hard cider beer about 8:30pm 9. CARDIAC HISTORY: "Do you have any history of heart disease?" (e.g., heart attack, angina, bypass surgery,  angioplasty, arrhythmia)      Arrhythmia about 15 years ago 100. OTHER SYMPTOMS: "Do you have any other symptoms?" (e.g., dizziness, chest pain, sweating, difficulty breathing)       Denies chest pain, dizziness, sweating, SOB. 11. PREGNANCY: "Is there any chance you are pregnant?" "When was your last menstrual period?"       No  Protocols used: HEART RATE AND HEARTBEAT QUESTIONS-A-AH

## 2017-05-28 NOTE — Telephone Encounter (Signed)
I spoke with Rollene Fare at [facility] regarding getting a referral sent to Dr. Lovena Le the cardiologist ASAP because he can see her today if she can get the referral.    Dr.. Rometta Emery nurse is working on the referral now.   I let the pt know but not to cancel the appt at 4:00 with Dr. Jerline Pain until she hears from Dr. Rometta Emery nurse.   She verbalized understanding.

## 2017-05-28 NOTE — Patient Instructions (Signed)
We will check blood work.  Continue drinking plenty of fluids.  Please keep your cardiology appointment scheduled for a few days from now.  Take care,  Dr Jerline Pain

## 2017-05-28 NOTE — Progress Notes (Signed)
   Subjective:  Kimberly Patton is a 61 y.o. female who presents today with a chief complaint of tachycardia.   HPI:  Tachycardia, Acute Issue Symptoms started this morning.  Stable over that time.  Patient reports that she woke up with some palpitations.  Her Fitbit told her that she was having an irregular heart rate.  She noted that when she stood up her heart rate 1 202 but while she was sitting her heart rate was in the 60s.  She did not think much of it until she went to work today.  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  This led to increased anxiety and subsequently a higher heart rate.  Denies any sort of chest pain or shortness of breath during the episodes.  No syncopal episodes.  She does feel like she has a "cotton mouth" but endorses good oral hydration.  Patient also admits to having 1 alcoholic beverage last night, but denies any other alcoholic beverages or caffeine intake over the past several weeks.  No other obvious alleviating or aggravating factors.  She went to [facility] emergency department about 3 weeks ago for chest pain and was diagnosed with rib pain.  ROS: Per HPI  PMH: Smoking history reviewed.  Never smoker.  Objective:  Physical Exam: Ht 5\' 6"  (1.676 m)   Wt 245 lb 6.4 oz (111.3 kg)   BMI 39.61 kg/m    Orthostatic VS for the past 24 hrs (Last 3 readings):  BP- Lying Pulse- Lying BP- Sitting Pulse- Sitting BP- Standing at 0 minutes Pulse- Standing at 0 minutes  05/28/17 1628 142/88 86 135/85 86 142/85 107    Gen: NAD, resting comfortably CV: RRR with no murmurs appreciated Pulm: NWOB, CTAB with no crackles, wheezes, or rhonchi GI: Normal bowel sounds present. Soft, Nontender, Nondistended. MSK: No edema, cyanosis, or clubbing noted Skin: Warm, dry Neuro: Grossly normal, moves all extremities Psych: Normal affect and thought content  EKG: Normal sinus rhythm.  Right bundle branch  block noted.  No acute ischemic changes.  Assessment/Plan:  Tachycardia Likely secondary to anxiety in addition to mild dehydration.  Her orthostatic vital signs do show an approximately 20 point rise in her heart rate from lying to standing.  Her EKG does not show any clear etiology for her symptoms.  We will check CBC, TSH, and BMET.  She has cardiology appointment scheduled for 3 days-advised her to keep this appointment.  In the meantime, advised patient to push oral fluids and discussed reasons to seek care emergently.  Microcytosis Check iron panel today with blood draw.  Hypertension Slightly above goal today.  Typically well controlled.  Continue HCTZ and diltiazem today.  If patient's symptoms thought to be most likely secondary to dehydration pending above workup, would consider switching HCTZ to ACE/ARB versus starting a beta-blocker.  Algis Greenhouse. Jerline Pain, MD 05/28/2017 4:26 PM

## 2017-05-28 NOTE — Telephone Encounter (Signed)
Called in requesting a referral to see Dr. Lovena Le the cardiologist  Today.    Dr. Lovena Le can see her today if Dr. Diona Browner will send over a referral for her ASAP. I routed a high priority note to Dr. Rometta Emery nurse pool.

## 2017-05-28 NOTE — Telephone Encounter (Signed)
Referral ordered

## 2017-05-29 LAB — CBC
HCT: 35.8 % — ABNORMAL LOW (ref 36.0–46.0)
Hemoglobin: 11.4 g/dL — ABNORMAL LOW (ref 12.0–15.0)
MCHC: 31.9 g/dL (ref 30.0–36.0)
MCV: 77.9 fl — ABNORMAL LOW (ref 78.0–100.0)
Platelets: 338 10*3/uL (ref 150.0–400.0)
RBC: 4.6 Mil/uL (ref 3.87–5.11)
RDW: 15.6 % — ABNORMAL HIGH (ref 11.5–15.5)
WBC: 7.2 10*3/uL (ref 4.0–10.5)

## 2017-05-29 LAB — BASIC METABOLIC PANEL
BUN: 23 mg/dL (ref 6–23)
CO2: 30 mEq/L (ref 19–32)
Calcium: 9.4 mg/dL (ref 8.4–10.5)
Chloride: 98 mEq/L (ref 96–112)
Creatinine, Ser: 0.48 mg/dL (ref 0.40–1.20)
GFR: 139.48 mL/min (ref >60.00)
Glucose, Bld: 91 mg/dL (ref 70–99)
Potassium: 3.3 mEq/L — ABNORMAL LOW (ref 3.5–5.1)
Sodium: 137 mEq/L (ref 135–145)

## 2017-05-29 LAB — IRON,TIBC AND FERRITIN PANEL
%SAT: 5 % (calc) — ABNORMAL LOW (ref 11–50)
Ferritin: 6 ng/mL — ABNORMAL LOW (ref 20–288)
Iron: 23 ug/dL — ABNORMAL LOW (ref 45–160)
TIBC: 477 mcg/dL (calc) — ABNORMAL HIGH (ref 250–450)

## 2017-05-29 LAB — TSH: TSH: 0.78 u[IU]/mL (ref 0.35–4.50)

## 2017-05-29 NOTE — Progress Notes (Signed)
Patient's potassium is better, however still a little low.  She should continue her potassium pills as planned previously. Her thyroid levels are normal.  She is low in iron.  She should start an iron supplement-ferrous sulfate 325 mg.  If this works better if she takes it with vitamin C and if she takes it every other day.  Her low iron could be contributing to her symptoms.  She should follow-up with her PCP or me for further evaluation of her anemia.  Algis Greenhouse. Jerline Pain, MD 05/29/2017 12:03 PM

## 2017-05-29 NOTE — Progress Notes (Signed)
Cardiology Office Note   Date:  05/31/2017   ID:  Kimberly Patton, DOB 02-29-1956, MRN [id number]  PCP:  Jinny Sanders, MD  Cardiologist:   No primary care provider on file. Referring:   Vivi Barrack, MD    Chief Complaint  Patient presents with  . Chest Pain  . Tachycardia      History of Present Illness: Kimberly Patton is a 61 y.o. female who is referred by Vivi Barrack, MD for evaluation palpitations.  She was seen by Dr. Lovena Le in 2013 for SVT. She did have a stress test in that was negative for ischemia.  She had an echo which was normal.  She was in the Ed   of chest pain.  He was seen in the ED for this.  I reviewed these records for this visit.     CTA was negative and troponin was negative.   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  She developed rapid heart rate.  It would happen whenever she would stand up.  She walked to the bathroom and her heart rate was 133.  She went to see a primary provider and was noted to have some increase in her heart rate with standing but she did not have significant orthostatic drop although he is warm formal orthostatic readings.  Was thought that she might be a little dehydrated and she has hydrated.  She was also found to have low iron and was started on this.  She said that after starting this and hydrating she felt better.  She has not had the same rapid heart rate.  She has not had any new substernal chest pain.  She has pain under both axilla.  She has had some leg swelling but this has been chronic with left leg swelling evaluated with no evidence of a DVT not long ago.  She has gained about 30 pounds after losing well over 100 pounds with gastric sleeve surgery in the past.  He has had more stress over the last couple of years and has gained weight because of this.  She does work as a Pharmacist, hospital and walks 10,000 steps a day easily.  He has some limitations because of knee pain chronic rib pain  following an accident at school.  This limits her physical activity.  She is not describing classic orthostatic symptoms.  She is not having any presyncope or syncope.  She is not describing PND or orthopnea.     Past Medical History:  Diagnosis Date  . ALLERGIC RHINITIS 04/03/2007  . ASTHMA, PERSISTENT, MODERATE 04/03/2007  . Dyspnea on exertion    a. 11/2007 Echo: EF 60%.  Marland Kitchen GERD (gastroesophageal reflux disease)   . 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 Hypertension   . Joint pain    HIPS / LEGS  . Knee injury    RT  . Meningioma ([place])   . Morbid obesity ([place])   . Paroxysmal SVT (supraventricular tachycardia) ([place])    a. 11/2011 48h Holter: RSR, rare PVC's, occas PAC's  . Retinal tear of right eye 01/2016  . Ventral hernia     Past Surgical History:  Procedure Laterality Date  . APPENDECTOMY  1996  . BREAST CYST EXCISION Right 1994   Benign  . CESAREAN SECTION     x2  . CHOLECYSTECTOMY  1995  . EYE SURGERY  01/2016   Repair retinal tear  . FOOT SURGERY  1995 / 1996   x2  . KNEE ARTHROSCOPY W/ MENISCAL REPAIR  07/25/13   right knee Dr. Mardelle Matte  . LAPAROSCOPIC GASTRIC SLEEVE RESECTION N/A 06/08/2013   Procedure: LAPAROSCOPIC GASTRIC SLEEVE RESECTION AND EXCISION OF SEBACEUS CYST FROM MID CHEST takedown of incarcerated ventral hernia and primary repair endoscopy;  Surgeon: [name], MD;  Location: [facility];  Service: General;  Laterality: N/A;  . moles  06/2013   removed 2 moles from under arm and  lowback  . OVARIAN CYST REMOVAL  1970  . PILONIDAL CYST EXCISION  1975  . RETINAL TEAR REPAIR CRYOTHERAPY Right 01/2016   [facility]  . SKIN CANCER EXCISION    . TONSILLECTOMY    . UPPER GI ENDOSCOPY  06/08/2013   Procedure: UPPER GI ENDOSCOPY;  Surgeon: [name], MD;  Location: [facility];  Service: General;;  . US ECHOCARDIOGRAPHY  12/2011   WNL - EF 55-60%, mild MR, grade 1 diastolic dysnfiction (mild)  . VENTRAL HERNIA REPAIR  2000     Current Outpatient Medications    Medication Sig Dispense Refill  . albuterol (PROAIR HFA) 108 (90 Base) MCG/ACT inhaler TAKE 2 PUFFS BY MOUTH EVERY 6 HOURS AS NEEDED 8.5 Inhaler 11  . aspirin EC 81 MG tablet Take 81 mg by mouth daily.    . Calcium Citrate-Vitamin D (CITRACAL + D PO) Take 2 tablets by mouth daily.     . Cyanocobalamin (VITAMIN B-12 SL) Place 1 tablet under the tongue every morning.    . diltiazem (CARDIZEM CD) 180 MG 24 hr capsule TAKE 1 CAPSULE (180 MG TOTAL) BY MOUTH DAILY. 90 capsule 3  . ferrous sulfate 325 (65 FE) MG tablet Take 325 mg by mouth daily with breakfast.    . fluticasone (FLONASE) 50 MCG/ACT nasal spray Place 2 sprays into both nostrils daily. 16 g 6  . hydrochlorothiazide (HYDRODIURIL) 25 MG tablet Take 1 tablet (25 mg total) by mouth daily. 90 tablet 1  . pantoprazole (PROTONIX) 40 MG tablet TAKE 1 TABLET BY MOUTH EVERY DAY 90 tablet 0  . potassium chloride (K-DUR) 10 MEQ tablet Take 1 tablet (10 mEq total) by mouth daily. 30 tablet 1  . TURMERIC CURCUMIN PO Take 1 tablet by mouth daily.     No current facility-administered medications for this visit.     Allergies:   Wasp venom; Prednisone; and Sodium hypochlorite    Social History:  The patient  reports that  has never smoked. she has never used smokeless tobacco. She reports that she drinks alcohol. She reports that she does not use drugs.   Family History:  The patient's family history includes Breast cancer in her mother; Diabetes in her father, mother, and unknown relative; Hemophilia in her unknown relative; Hypertension in her father, mother, and unknown relative; Kidney failure in her mother; Stroke in her unknown relative.    ROS:  Please see the history of present illness.   Otherwise, review of systems are positive for none.   All other systems are reviewed and negative.    PHYSICAL EXAM: VS:  BP 126/84   Pulse 77   Ht 5\' 7"  (1.702 m)   Wt 246 lb (111.6 kg)   BMI 38.53 kg/m  , BMI Body mass index is 38.53  kg/m. GENERAL:  Well appearing HEENT:  Pupils equal round and reactive, fundi not visualized, oral mucosa unremarkable NECK:  No jugular venous distention, waveform within normal limits, carotid upstroke brisk and symmetric, no bruits, no thyromegaly  LYMPHATICS:  No cervical, inguinal adenopathy LUNGS:  Clear to auscultation bilaterally BACK:  No CVA tenderness CHEST:  Unremarkable HEART:  PMI not displaced or sustained,S1 and S2 within normal limits, no S3, no S4, no clicks, no rubs, no murmurs ABD:  Flat, positive bowel sounds normal in frequency in pitch, no bruits, no rebound, no guarding, no midline pulsatile mass, no hepatomegaly, no splenomegaly EXT:  2 plus pulses throughout, no edema, no cyanosis no clubbing SKIN:  No rashes no nodules NEURO:  Cranial nerves II through XII grossly intact, motor grossly intact throughout PSYCH:  Cognitively intact, oriented to person place and time    EKG:  EKG is not ordered today. The ekg ordered 05/28/17 demonstrates sinus rhythm, incomplete right bundle branch block, no acute ST-T wave changes.   Recent Labs: 09/11/2016: ALT 12 05/05/2017: Magnesium 2.1 05/28/2017: BUN 23; Creatinine, Ser 0.48; Hemoglobin 11.4; Platelets 338.0; Potassium 3.3; Sodium 137; TSH 0.78    Lipid Panel    Component Value Date/Time   CHOL 183 05/31/2016 0811   TRIG 67.0 05/31/2016 0811   HDL 61.70 05/31/2016 0811   CHOLHDL 3 05/31/2016 0811   VLDL 13.4 05/31/2016 0811   LDLCALC 108 (H) 05/31/2016 0811      Wt Readings from Last 3 Encounters:  05/31/17 246 lb (111.6 kg)  05/28/17 245 lb 6.4 oz (111.3 kg)  05/05/17 240 lb (108.9 kg)      Other studies Reviewed: Additional studies/ records that were reviewed today include: ED records, office records. Review of the above records demonstrates:  Please see elsewhere in the note.     ASSESSMENT AND PLAN:   CHEST PAIN: Chest pain she has not recurrent and what she does have is an atypical axillary  discomfort that seems to be musculoskeletal.  No further ischemia workup is suggested.  PALPITATIONS:  In the office she did not have orthostatic changes.  I suspect some of this was related to potentially being dehydrated and we talked about if this happens she probably should stop her diuretic.  However, I think she could continue this currently because of her continued mild swelling.  Current medicines are reviewed at length with the patient today.  The patient does not have concerns regarding medicines.  The following changes have been made:  no change  Labs/ tests ordered today include: None No orders of the defined types were placed in this encounter.    Disposition:   FU with me as needed.      Signed, Minus Breeding, MD  05/31/2017 1:01 PM    [place]

## 2017-05-30 ENCOUNTER — Encounter: Payer: Self-pay | Admitting: Family Medicine

## 2017-05-31 ENCOUNTER — Ambulatory Visit: Payer: BC Managed Care – PPO | Admitting: Cardiology

## 2017-05-31 ENCOUNTER — Encounter: Payer: Self-pay | Admitting: Cardiology

## 2017-05-31 VITALS — BP 126/84 | HR 77 | Ht 67.0 in | Wt 246.0 lb

## 2017-05-31 DIAGNOSIS — R002 Palpitations: Secondary | ICD-10-CM | POA: Diagnosis not present

## 2017-05-31 DIAGNOSIS — R0789 Other chest pain: Secondary | ICD-10-CM | POA: Diagnosis not present

## 2017-05-31 NOTE — Patient Instructions (Signed)
Medication Instructions:  Continue current medications  If you need a refill on your cardiac medications before your next appointment, please call your pharmacy.  Labwork: None Ordered  Testing/Procedures: None Ordered  Special Instructions:  Happy Holidays!!!  Follow-Up: Your physician wants you to follow-up in: As Needed.    Thank you for choosing [facility] at [facility]!!

## 2017-06-07 ENCOUNTER — Ambulatory Visit: Payer: Self-pay

## 2017-06-07 NOTE — Telephone Encounter (Signed)
Noted  

## 2017-06-07 NOTE — Telephone Encounter (Signed)
Pt. called with c/o dizziness that started on Thursday, 12/27.  Reported the feeling comes and goes, and "it feels weird in my forehead."  Stated she recently had a work-up for low iron and low potassium, so she felt she should report the dizziness.  Denies feeling faint.  Stated her "eyes feel funky".  Denies any speech difficulty, weakness of extremities. Reported balance is okay.  Denied chest pain or shortness of breath. Reported pulse rate at 73 during Triage call.  Per protocol, recommended pt. Be seen within 24 hrs.  Appt. Made on 12/29 @ [facility] @ [facility] advice per protocol.  Verb. Understanding. And agrees with plan.      Reason for Disposition . [1] MODERATE dizziness (e.g., interferes with normal activities) AND [2] has NOT been evaluated by physician for this  (Exception: dizziness caused by heat exposure, sudden standing, or poor fluid intake)  Answer Assessment - Initial Assessment Questions 1. DESCRIPTION: "Describe your dizziness."     Feels in forehead a weird feeling  2. LIGHTHEADED: "Do you feel lightheaded?" (e.g., somewhat faint, woozy, weak upon standing)     No fainting  3. VERTIGO: "Do you feel like either you or the room is spinning or tilting?" (i.e. vertigo)     no 4. SEVERITY: "How bad is it?"  "Do you feel like you are going to faint?" "Can you stand and walk?"   - MILD - walking normally   - MODERATE - interferes with normal activities (e.g., work, school)    - SEVERE - unable to stand, requires support to walk, feels like passing out now.      Moderate 5. ONSET:  "When did the dizziness begin?"     yesterday 6. AGGRAVATING FACTORS: "Does anything make it worse?" (e.g., standing, change in head position)     It comes and goes; doesn't know of any aggravating factor   7. HEART RATE: "Can you tell me your heart rate?" "How many beats in 15 seconds?"  (Note: not all patients can do this)       73 8. CAUSE: "What do you think is causing the dizziness?"     Unknown;  recent low iron and low K+ 9. RECURRENT SYMPTOM: "Have you had dizziness before?" If so, ask: "When was the last time?" "What happened that time?"     A few years ago 10. OTHER SYMPTOMS: "Do you have any other symptoms?" (e.g., fever, chest pain, vomiting, diarrhea, bleeding)       Eyes "feel funky"; balance okay, no extremity weakness, no chest pain, no shortness of breath 11. PREGNANCY: "Is there any chance you are pregnant?" "When was your last menstrual period?"       No  Protocols used: DIZZINESS [facility]

## 2017-06-08 ENCOUNTER — Encounter: Payer: Self-pay | Admitting: Family Medicine

## 2017-06-08 ENCOUNTER — Ambulatory Visit: Payer: BC Managed Care – PPO | Admitting: Family Medicine

## 2017-06-08 VITALS — BP 126/84 | HR 72 | Temp 98.0°F | Wt 245.0 lb

## 2017-06-08 DIAGNOSIS — D509 Iron deficiency anemia, unspecified: Secondary | ICD-10-CM | POA: Diagnosis not present

## 2017-06-08 DIAGNOSIS — R42 Dizziness and giddiness: Secondary | ICD-10-CM | POA: Diagnosis not present

## 2017-06-08 DIAGNOSIS — J0100 Acute maxillary sinusitis, unspecified: Secondary | ICD-10-CM

## 2017-06-08 DIAGNOSIS — E876 Hypokalemia: Secondary | ICD-10-CM | POA: Insufficient documentation

## 2017-06-08 DIAGNOSIS — D649 Anemia, unspecified: Secondary | ICD-10-CM | POA: Insufficient documentation

## 2017-06-08 DIAGNOSIS — J329 Chronic sinusitis, unspecified: Secondary | ICD-10-CM | POA: Insufficient documentation

## 2017-06-08 MED ORDER — MECLIZINE HCL 25 MG PO TABS: 25.0000 mg | ORAL_TABLET | Freq: Three times a day (TID) | ORAL | 0 refills | Status: DC | PRN

## 2017-06-08 MED ORDER — AMOXICILLIN 500 MG PO CAPS: 500.0000 mg | ORAL_CAPSULE | Freq: Three times a day (TID) | ORAL | 0 refills | Status: DC

## 2017-06-08 NOTE — Patient Instructions (Signed)
Take the amoxicillin for suspected sinus infection / sinusitis  Drink fluids Try the meclizine for dizziness and nausea   Hold the tumeric and vit D and iron for now   Call to get lab appt on Monday - I put the orders in  Make an appt with Dr Diona Browner   If worse in the meantime go to the ED

## 2017-06-08 NOTE — Progress Notes (Signed)
Subjective:    Patient ID: Kimberly Patton, female    DOB: 1956/05/06, 61 y.o.   MRN: [id number]  HPI  61 yo female here with dizziness on and off since Thursday  Not spinning/ just light headed  Feels weird in eyes and cheeks ? If sinus problem  (had a really bad uri in oct and has not "been right " since then  A little nauseated  Loose stool  Legs are more swollen than usual    Recent w/u for low iron and K  S/p bariatric surgery in the past  No cp/sob or other neuro symptoms    Wt Readings from Last 3 Encounters:  06/08/17 245 lb (111.1 kg)  05/31/17 246 lb (111.6 kg)  05/28/17 245 lb 6.4 oz (111.3 kg)    BP Readings from Last 3 Encounters:  06/08/17 126/84  05/31/17 126/84  05/28/17 135/85   Pulse Readings from Last 3 Encounters:  06/08/17 72  05/31/17 77  05/28/17 86   Temp: 98 F (36.7 C)     Lab Results  Component Value Date   CREATININE 0.48 05/28/2017   BUN 23 05/28/2017   NA 137 05/28/2017   K 3.3 (L) 05/28/2017   CL 98 05/28/2017   CO2 30 05/28/2017   Was in the ED- had CP then (has [place]) -it was that acting up  This is resolved now   Lab Results  Component Value Date   WBC 7.2 05/28/2017   HGB 11.4 (L) 05/28/2017   HCT 35.8 (L) 05/28/2017   MCV 77.9 (L) 05/28/2017   PLT 338.0 05/28/2017   Takes ferrous sulfate- just learned she is anemic  ? From bariatric surgery   Patient Active Problem List   Diagnosis Date Noted  . Dizziness 06/08/2017  . Sinusitis 06/08/2017  . Hypokalemia 06/08/2017  . Anemia 06/08/2017  . Osteoporosis 12/04/2016  . Rib pain on right side 09/11/2016  . RUQ pain 09/11/2016  . Osteoarthritis of knee 07/17/2016  . Meningioma ([place]) 01/18/2016  . Family history of aortic stenosis 01/20/2015  . Viral URI with cough 07/30/2014  . Status post laparoscopic sleeve gastrectomy Dec 2014 06/08/2013  . Body mass index (BMI) of 36.0-36.9 in adult 12/08/2012  . HTN (hypertension) 10/11/2011  . Varicose veins of lower  extremities with other complications 51/07/5850  . FIBROIDS, UTERUS 04/03/2007  . ALLERGIC RHINITIS 04/03/2007  . Asthma, mild intermittent 04/03/2007   Past Medical History:  Diagnosis Date  . ALLERGIC RHINITIS 04/03/2007  . ASTHMA, PERSISTENT, MODERATE 04/03/2007  . Dyspnea on exertion    a. 11/2007 Echo: EF 60%.  Marland Kitchen GERD (gastroesophageal reflux disease)   . 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 Hypertension   . Joint pain    HIPS / LEGS  . Knee injury    RT  . Meningioma ([place])   . Morbid obesity ([place])   . Paroxysmal SVT (supraventricular tachycardia) ([place])    a. 11/2011 48h Holter: RSR, rare PVC's, occas PAC's  . Retinal tear of right eye 01/2016  . Ventral hernia    Past Surgical History:  Procedure Laterality Date  . APPENDECTOMY  1996  . BREAST CYST EXCISION Right 1994   Benign  . CESAREAN SECTION     x2  . CHOLECYSTECTOMY  1995  . EYE SURGERY  01/2016   Repair retinal tear  . [place] / 1996   x2  . KNEE ARTHROSCOPY W/ MENISCAL REPAIR  07/25/13   right knee Dr. Mardelle Matte  .  LAPAROSCOPIC GASTRIC SLEEVE RESECTION N/A 06/08/2013   Procedure: LAPAROSCOPIC GASTRIC SLEEVE RESECTION AND EXCISION OF SEBACEUS CYST FROM MID CHEST takedown of incarcerated ventral hernia and primary repair endoscopy;  Surgeon: [name], MD;  Location: [facility];  Service: General;  Laterality: N/A;  . moles  06/2013   removed 2 moles from under arm and  lowback  . OVARIAN CYST REMOVAL  1970  . PILONIDAL CYST EXCISION  1975  . RETINAL TEAR REPAIR CRYOTHERAPY Right 01/2016   [facility]  . SKIN CANCER EXCISION    . TONSILLECTOMY    . UPPER GI ENDOSCOPY  06/08/2013   Procedure: UPPER GI ENDOSCOPY;  Surgeon: [name], MD;  Location: [facility];  Service: General;;  . US ECHOCARDIOGRAPHY  12/2011   WNL - EF 55-60%, mild MR, grade 1 diastolic dysnfiction (mild)  . VENTRAL HERNIA REPAIR  2000   Social History   Tobacco Use  . Smoking status: Never Smoker  . Smokeless tobacco: Never Used  .  Tobacco comment: smoke at age [age]  Substance Use Topics  . Alcohol use: Yes    Alcohol/week: 0.0 oz    Comment: Occasionally  . Drug use: No   Family History  Problem Relation Age of Onset  . Breast cancer Mother   . Diabetes Mother   . Hypertension Mother   . Kidney failure Mother   . Diabetes Father   . Hypertension Father   . Diabetes Unknown   . Hypertension Unknown   . Stroke Unknown   . Hemophilia Unknown    Allergies  Allergen Reactions  . Wasp Venom Anaphylaxis  . Prednisone Other (See Comments)    Severe migraine / with taper dose  . Sodium Hypochlorite Rash    Liquid clorox bleach   Current Outpatient Medications on File Prior to Visit  Medication Sig Dispense Refill  . albuterol (PROAIR HFA) 108 (90 Base) MCG/ACT inhaler TAKE 2 PUFFS BY MOUTH EVERY 6 HOURS AS NEEDED 8.5 Inhaler 11  . Ascorbic Acid (VITAMIN C) 1000 MG tablet Take 1,000 mg by mouth daily.    Marland Kitchen aspirin EC 81 MG tablet Take 81 mg by mouth daily.    . Calcium Citrate-Vitamin D (CITRACAL + D PO) Take 2 tablets by mouth daily.     . Cyanocobalamin (VITAMIN B-12 SL) Place 1 tablet under the tongue every morning.    . diltiazem (CARDIZEM CD) 180 MG 24 hr capsule TAKE 1 CAPSULE (180 MG TOTAL) BY MOUTH DAILY. 90 capsule 3  . ferrous sulfate 325 (65 FE) MG tablet Take 325 mg by mouth daily with breakfast.    . fluticasone (FLONASE) 50 MCG/ACT nasal spray Place 2 sprays into both nostrils daily. 16 g 6  . hydrochlorothiazide (HYDRODIURIL) 25 MG tablet Take 1 tablet (25 mg total) by mouth daily. 90 tablet 1  . pantoprazole (PROTONIX) 40 MG tablet TAKE 1 TABLET BY MOUTH EVERY DAY 90 tablet 0  . potassium chloride (K-DUR) 10 MEQ tablet Take 1 tablet (10 mEq total) by mouth daily. 30 tablet 1  . TURMERIC CURCUMIN PO Take 1 tablet by mouth daily.     No current facility-administered medications on file prior to visit.       Review of Systems  Constitutional: Positive for appetite change and fatigue.  Negative for chills, diaphoresis and fever.  HENT: Positive for congestion, postnasal drip, sinus pressure and sinus pain. Negative for ear discharge, ear pain, nosebleeds, rhinorrhea, sore throat and tinnitus.   Eyes: Negative for  pain, redness and itching.  Respiratory: Negative for cough, shortness of breath and wheezing.   Cardiovascular: Negative for chest pain.  Gastrointestinal: Negative for abdominal pain, diarrhea, nausea and vomiting.  Endocrine: Negative for polyuria.  Genitourinary: Negative for dysuria, frequency and urgency.  Musculoskeletal: Negative for arthralgias and myalgias.  Allergic/Immunologic: Negative for immunocompromised state.  Neurological: Positive for dizziness, light-headedness and headaches. Negative for tremors, seizures, syncope, facial asymmetry, speech difficulty, weakness and numbness.  Hematological: Negative for adenopathy. Does not bruise/bleed easily.  Psychiatric/Behavioral: Negative for dysphoric mood. The patient is not nervous/anxious.        Objective:   Physical Exam  Constitutional: She is oriented to person, place, and time. She appears well-developed and well-nourished. No distress.  Obese and fatigued appearing   HENT:  Head: Normocephalic and atraumatic.  Right Ear: External ear normal.  Left Ear: External ear normal.  Nose: Nose normal.  Mouth/Throat: Oropharynx is clear and moist. No oropharyngeal exudate.  Marked L maxillary sinus tenderness w/o facial swelling TMs clear Clear pnd  Nares are injected  No temporal tenderness  No TMJ tenderness  Eyes: Conjunctivae and EOM are normal. Pupils are equal, round, and reactive to light. Right eye exhibits no discharge. Left eye exhibits no discharge. No scleral icterus.  1-2 beats of horizontal nystagmus bilaterally  Neck: Normal range of motion and full passive range of motion without pain. Neck supple. No JVD present. Carotid bruit is not present. No tracheal deviation present. No  thyromegaly present.  Cardiovascular: Normal rate, regular rhythm and normal heart sounds.  No murmur heard. Pulmonary/Chest: Effort normal and breath sounds normal. No respiratory distress. She has no wheezes. She has no rales.  Abdominal: Soft. Bowel sounds are normal. She exhibits no distension and no mass. There is no tenderness.  Musculoskeletal: She exhibits no edema or tenderness.  Lymphadenopathy:    She has no cervical adenopathy.  Neurological: She is alert and oriented to person, place, and time. She has normal strength and normal reflexes. She displays no atrophy and no tremor. No cranial nerve deficit or sensory deficit. She exhibits normal muscle tone. She displays a negative Romberg sign. Coordination and gait normal.  No focal cerebellar signs   Skin: Skin is warm and dry. No rash noted. No erythema. No pallor.  Psychiatric: She has a normal mood and affect. Her behavior is normal. Thought content normal.          Assessment & Plan:   Problem List Items Addressed This Visit      Respiratory   Sinusitis - Primary    Pt c/o of dizziness/malaise today and has marked L maxillary sinus tenderness with some congestion  Will tx with amoxicillin  Disc symptomatic care - see instructions on AVS  Alert if this symptoms do not resolve with tx       Relevant Medications   amoxicillin (AMOXIL) 500 MG capsule     Other   Anemia    Mild in ED Unsure if related to her current symptoms  Re check Monday in pcp office with ferritin Holding ferrous sulfate due to GI upset       Relevant Orders   CBC with Differential/Platelet   Ferritin   Dizziness    Suspect this is primarily due to her sinusitis  tx with amox/sympt care Meclizine with caution of sedation  Alert if not imp   Also rev last labs from ED- low K and mild anemia (has had bariatric surgery in the past)  Arrange for re check on Monday at pcp office since we [facility] not have lab [facility]  She agrees  Will cc  PCP      Relevant Orders   CBC with Differential/Platelet   Hypokalemia    At last check in ED She is a bariatric surgery pt  Takes hctz  Also no missed doses of K dur 10 meq Will arrange to re check mon in the office (lab order done)      Relevant Orders   Basic metabolic panel

## 2017-06-09 NOTE — Assessment & Plan Note (Signed)
Suspect this is primarily due to her sinusitis  tx with amox/sympt care Meclizine with caution of sedation  Alert if not imp   Also rev last labs from ED- low K and mild anemia (has had bariatric surgery in the past)  Arrange for re check on Monday at pcp office since we do not have lab [facility]  She agrees  Will cc PCP

## 2017-06-09 NOTE — Assessment & Plan Note (Signed)
At last check in ED She is a bariatric surgery pt  Takes hctz  Also no missed doses of K dur 10 meq Will arrange to re check mon in the office (lab order done)

## 2017-06-09 NOTE — Assessment & Plan Note (Signed)
Pt c/o of dizziness/malaise today and has marked L maxillary sinus tenderness with some congestion  Will tx with amoxicillin  Disc symptomatic care - see instructions on AVS  Alert if this symptoms do not resolve with tx

## 2017-06-09 NOTE — Assessment & Plan Note (Signed)
Mild in ED Unsure if related to her current symptoms  Re check Monday in pcp office with ferritin Holding ferrous sulfate due to GI upset

## 2017-06-10 ENCOUNTER — Telehealth: Payer: Self-pay | Admitting: Cardiology

## 2017-06-10 ENCOUNTER — Other Ambulatory Visit (INDEPENDENT_AMBULATORY_CARE_PROVIDER_SITE_OTHER): Payer: BC Managed Care – PPO

## 2017-06-10 DIAGNOSIS — E876 Hypokalemia: Secondary | ICD-10-CM

## 2017-06-10 DIAGNOSIS — D509 Iron deficiency anemia, unspecified: Secondary | ICD-10-CM

## 2017-06-10 DIAGNOSIS — R42 Dizziness and giddiness: Secondary | ICD-10-CM

## 2017-06-10 LAB — CBC WITH DIFFERENTIAL/PLATELET
Basophils Absolute: 0 10*3/uL (ref 0.0–0.1)
Basophils Relative: 0.7 % (ref 0.0–3.0)
Eosinophils Absolute: 0.1 10*3/uL (ref 0.0–0.7)
Eosinophils Relative: 2.2 % (ref 0.0–5.0)
HCT: 37.1 % (ref 36.0–46.0)
Hemoglobin: 11.9 g/dL — ABNORMAL LOW (ref 12.0–15.0)
Lymphocytes Relative: 29.5 % (ref 12.0–46.0)
Lymphs Abs: 1.7 10*3/uL (ref 0.7–4.0)
MCHC: 32 g/dL (ref 30.0–36.0)
MCV: 79.2 fl (ref 78.0–100.0)
Monocytes Absolute: 0.4 10*3/uL (ref 0.1–1.0)
Monocytes Relative: 7 % (ref 3.0–12.0)
Neutro Abs: 3.4 10*3/uL (ref 1.4–7.7)
Neutrophils Relative %: 60.6 % (ref 43.0–77.0)
Platelets: 326 10*3/uL (ref 150.0–400.0)
RBC: 4.69 Mil/uL (ref 3.87–5.11)
RDW: 16.6 % — ABNORMAL HIGH (ref 11.5–15.5)
WBC: 5.6 10*3/uL (ref 4.0–10.5)

## 2017-06-10 LAB — BASIC METABOLIC PANEL
BUN: 17 mg/dL (ref 6–23)
CO2: 31 mEq/L (ref 19–32)
Calcium: 9.5 mg/dL (ref 8.4–10.5)
Chloride: 99 mEq/L (ref 96–112)
Creatinine, Ser: 0.49 mg/dL (ref 0.40–1.20)
GFR: 136.18 mL/min (ref >60.00)
Glucose, Bld: 85 mg/dL (ref 70–99)
Potassium: 3.9 mEq/L (ref 3.5–5.1)
Sodium: 138 mEq/L (ref 135–145)

## 2017-06-10 LAB — FERRITIN: Ferritin: 6.4 ng/mL — ABNORMAL LOW (ref 10.0–291.0)

## 2017-06-10 NOTE — Telephone Encounter (Signed)
Returned call to patient.She stated she has been lightheaded,tingling sensation left eye.She saw her PCP this past Saturday.He prescribed amoxicillin for sinus infection.She is scheduled to have lab work this morning.Stated Dr.Hochrein wanted to know if she had any more problems.Stated she is not having chest pain at present.Advised to follow up with PCP.Advised I will send message to Dr.Hochrein for review.

## 2017-06-10 NOTE — Telephone Encounter (Signed)
Agree she should follow up with primary.

## 2017-06-10 NOTE — Telephone Encounter (Signed)
New message   Pt verbalized that she is lightheaded and she has a little feeling in her cheek and her eye

## 2017-06-12 ENCOUNTER — Ambulatory Visit: Payer: BC Managed Care – PPO | Admitting: Family Medicine

## 2017-06-12 ENCOUNTER — Encounter: Payer: Self-pay | Admitting: Family Medicine

## 2017-06-12 VITALS — BP 110/70 | HR 65 | Temp 97.7°F | Ht 66.0 in | Wt 244.8 lb

## 2017-06-12 DIAGNOSIS — J0100 Acute maxillary sinusitis, unspecified: Secondary | ICD-10-CM | POA: Diagnosis not present

## 2017-06-12 DIAGNOSIS — R202 Paresthesia of skin: Secondary | ICD-10-CM | POA: Diagnosis not present

## 2017-06-12 LAB — FOLATE: Folate: 17.5 ng/mL (ref >5.9)

## 2017-06-12 LAB — VITAMIN B12: Vitamin B-12: >1500 pg/mL — ABNORMAL HIGH (ref 211–911)

## 2017-06-12 LAB — VITAMIN D 25 HYDROXY (VIT D DEFICIENCY, FRACTURES): VITD: 32.96 ng/mL (ref 30.00–100.00)

## 2017-06-12 MED ORDER — AMITRIPTYLINE HCL 25 MG PO TABS: 25.0000 mg | ORAL_TABLET | Freq: Every day | ORAL | 2 refills | Status: DC

## 2017-06-12 NOTE — Progress Notes (Signed)
Dr. Frederico Hamman T. Daesia Zylka, MD, [facility] [place], [address] Phone: [phone] Fax: [phone]  06/12/2017  Patient: Kimberly Patton, MRN: [id number], DOB: August 24, 1955, 62 y.o.  Primary Physician:  Jinny Sanders, MD   Chief Complaint  Patient presents with  . Follow-up    [facility] 06/08/17   Subjective:   Kimberly Patton is a 62 y.o. very pleasant female patient who presents with the following:  Patient of Dr. Diona Browner who presents in follow-up from a Saturday clinic appointment with Dr. Glori Bickers on June 08, 2017, and she presents to follow-up with multiple symptoms with me today in the office.  October, got very sick. From that point on has not been feeling well. Still has some chest pain. [place], light[place]. Taking some iron.  My partner on Saturday thought that the patient had some sinusitis and gave her some amoxicillin to take.  She also has multiple other symptoms that she complains about and talks to me about including tingling all over her hands and fingers all the time as well as tingling and pain in her scalp which is also hypersensitive.  She has been evaluated by multiple workers comp physicians and is currently being cared for by them in regards to these and multiple other injuries. Tingling all over the hands and fingers all the time.   Scalp is very sensitive.   vits - tingling  Past Medical History, Surgical History, Social History, Family History, Problem List, Medications, and Allergies have been reviewed and updated if relevant.  Patient Active Problem List   Diagnosis Date Noted  . Dizziness 06/08/2017  . Sinusitis 06/08/2017  . Hypokalemia 06/08/2017  . Anemia 06/08/2017  . Osteoporosis 12/04/2016  . Rib pain on right side 09/11/2016  . RUQ pain 09/11/2016  . Osteoarthritis of knee 07/17/2016  . Meningioma ([place]) 01/18/2016  . Family history of aortic stenosis  01/20/2015  . Viral URI with cough 07/30/2014  . Status post laparoscopic sleeve gastrectomy Dec 2014 06/08/2013  . Body mass index (BMI) of 36.0-36.9 in adult 12/08/2012  . HTN (hypertension) 10/11/2011  . Varicose veins of lower extremities with other complications 73/22/0254  . FIBROIDS, UTERUS 04/03/2007  . ALLERGIC RHINITIS 04/03/2007  . Asthma, mild intermittent 04/03/2007    Past Medical History:  Diagnosis Date  . ALLERGIC RHINITIS 04/03/2007  . ASTHMA, PERSISTENT, MODERATE 04/03/2007  . Dyspnea on exertion    a. 11/2007 Echo: EF 60%.  Marland Kitchen GERD (gastroesophageal reflux disease)   . 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 Hypertension   . Joint pain    HIPS / LEGS  . Knee injury    RT  . Meningioma ([place])   . Morbid obesity ([place])   . Paroxysmal SVT (supraventricular tachycardia) ([place])    a. 11/2011 48h Holter: RSR, rare PVC's, occas PAC's  . Retinal tear of right eye 01/2016  . Ventral hernia     Past Surgical History:  Procedure Laterality Date  . APPENDECTOMY  1996  . BREAST CYST EXCISION Right 1994   Benign  . CESAREAN SECTION     x2  . CHOLECYSTECTOMY  1995  . EYE SURGERY  01/2016   Repair retinal tear  . [place] / 1996   x2  . KNEE ARTHROSCOPY W/ MENISCAL REPAIR  07/25/13   right knee Dr. Mardelle Matte  . LAPAROSCOPIC GASTRIC SLEEVE RESECTION N/A 06/08/2013   Procedure: LAPAROSCOPIC GASTRIC SLEEVE RESECTION  AND EXCISION OF SEBACEUS CYST FROM MID CHEST takedown of incarcerated ventral hernia and primary repair endoscopy;  Surgeon: [name], MD;  Location: [facility];  Service: General;  Laterality: N/A;  . moles  06/2013   removed 2 moles from under arm and  lowback  . OVARIAN CYST REMOVAL  1970  . PILONIDAL CYST EXCISION  1975  . RETINAL TEAR REPAIR CRYOTHERAPY Right 01/2016   [place]  . SKIN CANCER EXCISION    . TONSILLECTOMY    . UPPER GI ENDOSCOPY  06/08/2013   Procedure: UPPER GI ENDOSCOPY;  Surgeon: [name], MD;  Location: [facility];  Service: General;;    . US ECHOCARDIOGRAPHY  12/2011   WNL - EF 55-60%, mild MR, grade 1 diastolic dysnfiction (mild)  . VENTRAL HERNIA REPAIR  2000    Social History   Socioeconomic History  . Marital status: Married    Spouse name: Not on file  . Number of children: 2  . Years of education: Bachelors  . Highest education level: Not on file  Social Needs  . Financial resource strain: Not on file  . Food insecurity - worry: Not on file  . Food insecurity - inability: Not on file  . Transportation needs - medical: Not on file  . Transportation needs - non-medical: Not on file  Occupational History  . Occupation: Product manager: [employer]  Tobacco Use  . Smoking status: Never Smoker  . Smokeless tobacco: Never Used  . Tobacco comment: smoke at age [age]  Substance and Sexual Activity  . Alcohol use: Yes    Alcohol/week: 0.0 oz    Comment: Occasionally  . Drug use: No  . Sexual activity: Not on file  Other Topics Concern  . Not on file  Social History Narrative   Lives at home alone.   Right-handed.   Occasional caffeine use.    Family History  Problem Relation Age of Onset  . Breast cancer Mother   . Diabetes Mother   . Hypertension Mother   . Kidney failure Mother   . Diabetes Father   . Hypertension Father   . Diabetes Unknown   . Hypertension Unknown   . Stroke Unknown   . Hemophilia Unknown     Allergies  Allergen Reactions  . Wasp Venom Anaphylaxis  . Prednisone Other (See Comments)    Severe migraine / with taper dose  . Sodium Hypochlorite Rash    Liquid clorox bleach    Medication list reviewed and updated in full in [place].  ROS: GEN: Acute illness details above GI: Tolerating PO intake GU: maintaining adequate hydration and urination Pulm: No SOB Interactive and getting along well at home.  Otherwise, ROS is as per the HPI.  Objective:   BP 110/70   Pulse 65   Temp 97.7 F (36.5 C) (Oral)   Ht 5\' 6"  (1.676 m)   Wt 244 lb  12 oz (111 kg)   SpO2 100%   BMI 39.50 kg/m    Gen: WDWN, NAD; A & O x3, cooperative. Pleasant.Globally Non-toxic HEENT: Normocephalic and atraumatic. Throat clear, w/o exudate, R TM clear, L TM - good landmarks, No fluid present. rhinnorhea.  MMM. Scalp is hypersensitive.  Frontal sinuses: NT Max sinuses: NT NECK: Anterior cervical  LAD is absent CV: RRR, No M/G/R, cap refill <2 sec PULM: Breathing comfortably in no respiratory distress. no wheezing, crackles, rhonchi EXT: No c/c/e PSYCH: Friendly, good eye contact MSK:  Nml gait   Laboratory and Imaging Data: Results for orders placed or performed in visit on 06/12/17  Vitamin B12  Result Value Ref Range   Vitamin B-12 >1500 (H) 211 - 911 pg/mL  VITAMIN D 25 Hydroxy (Vit-D Deficiency, Fractures)  Result Value Ref Range   VITD 32.96 30.00 - 100.00 ng/mL  Vitamin B6  Result Value Ref Range   Vitamin B6 26.0 (H) 2.1 - 21.7 ng/mL  Folate  Result Value Ref Range   Folate 17.5 >5.9 ng/mL     Assessment and Plan:   Acute maxillary sinusitis, recurrence not specified  Tingling - Plan: Vitamin B12, VITAMIN D 25 Hydroxy (Vit-D Deficiency, Fractures), Vitamin B6, Folate  She is doing fine from sinusitis standpoint and currently does not even really have much sinus pain.  The patient was fairly desperate for any type of help from her neuropathic pain and tingling, so I did a basic workup which ultimately came negative.  Suggested that she could potentially try neuropathic pain medication and bring this up with her workers comp physicians.  Follow-up: No Follow-up on file.  Meds ordered this encounter  Medications  . amitriptyline (ELAVIL) 25 MG tablet    Sig: Take 1 tablet (25 mg total) by mouth at bedtime.    Dispense:  30 tablet    Refill:  2   Medications Discontinued During This Encounter  Medication Reason  . ferrous sulfate 325 (65 FE) MG tablet Change in therapy  . TURMERIC CURCUMIN PO    Orders Placed This  Encounter  Procedures  . Vitamin B12  . VITAMIN D 25 Hydroxy (Vit-D Deficiency, Fractures)  . Vitamin B6  . Folate    Signed,  Ladarious Kresse T. Wilkins Elpers, MD   Allergies as of 06/12/2017      Reactions   Wasp Venom Anaphylaxis   Prednisone Other (See Comments)   Severe migraine / with taper dose   Sodium Hypochlorite Rash   Liquid clorox bleach      Medication List        Accurate as of 06/12/17 11:59 PM. Always use your most recent med list.          albuterol 108 (90 Base) MCG/ACT inhaler Commonly known as:  PROAIR HFA TAKE 2 PUFFS BY MOUTH EVERY 6 HOURS AS NEEDED   amitriptyline 25 MG tablet Commonly known as:  ELAVIL Take 1 tablet (25 mg total) by mouth at bedtime.   amoxicillin 500 MG capsule Commonly known as:  AMOXIL Take 1 capsule (500 mg total) by mouth 3 (three) times daily. With food   aspirin EC 81 MG tablet Take 81 mg by mouth daily.   CITRACAL + D PO Take 2 tablets by mouth daily.   diltiazem 180 MG 24 hr capsule Commonly known as:  CARDIZEM CD TAKE 1 CAPSULE (180 MG TOTAL) BY MOUTH DAILY.   fluticasone 50 MCG/ACT nasal spray Commonly known as:  FLONASE Place 2 sprays into both nostrils daily.   hydrochlorothiazide 25 MG tablet Commonly known as:  HYDRODIURIL Take 1 tablet (25 mg total) by mouth daily.   meclizine 25 MG tablet Commonly known as:  ANTIVERT Take 1 tablet (25 mg total) by mouth 3 (three) times daily as needed for dizziness.   pantoprazole 40 MG tablet Commonly known as:  PROTONIX TAKE 1 TABLET BY MOUTH EVERY DAY   potassium chloride 10 MEQ tablet Commonly known as:  K-DUR Take 1 tablet (10 mEq total) by mouth daily.   SLOW FE PO Take  1 tablet by mouth daily.   VITAMIN B-12 SL Place 1 tablet under the tongue every morning.   vitamin C 1000 MG tablet Take 1,000 mg by mouth daily.

## 2017-06-12 NOTE — Patient Instructions (Addendum)
For tingling:  Gabapentin or Lyrica  Or Elavil at night

## 2017-06-15 LAB — VITAMIN B6: Vitamin B6: 26 ng/mL — ABNORMAL HIGH (ref 2.1–21.7)

## 2017-06-16 ENCOUNTER — Other Ambulatory Visit: Payer: Self-pay | Admitting: Family Medicine

## 2017-06-18 ENCOUNTER — Encounter: Payer: Self-pay | Admitting: Family Medicine

## 2017-07-16 ENCOUNTER — Other Ambulatory Visit: Payer: Self-pay | Admitting: Family Medicine

## 2017-07-16 ENCOUNTER — Encounter: Payer: Self-pay | Admitting: *Deleted

## 2017-07-16 ENCOUNTER — Telehealth: Payer: Self-pay | Admitting: Family Medicine

## 2017-07-16 NOTE — Telephone Encounter (Signed)
Yes, please.  Kimberly Patton

## 2017-07-16 NOTE — Telephone Encounter (Signed)
Labs 4/22 cpx 4/26 Pt aware

## 2017-07-16 NOTE — Telephone Encounter (Signed)
Butch Penny  Can I work pt in for cpx week of 4/22 through 4/26 with dr Diona Browner  Thanks  Copied from [place] [id number]. Topic: Appointment Scheduling - Scheduling Inquiry for Clinic >> Jul 16, 2017 12:55 PM Synthia Innocent wrote: Reason for CRM: Patient is requesting to be worked in for a CPE April 22-26. 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 Please advise

## 2017-07-23 ENCOUNTER — Other Ambulatory Visit: Payer: Self-pay | Admitting: Family Medicine

## 2017-09-06 ENCOUNTER — Telehealth: Payer: Self-pay | Admitting: Family Medicine

## 2017-09-06 ENCOUNTER — Encounter: Payer: Self-pay | Admitting: Family Medicine

## 2017-09-06 ENCOUNTER — Other Ambulatory Visit: Payer: Self-pay

## 2017-09-06 ENCOUNTER — Encounter: Payer: Self-pay | Admitting: Neurology

## 2017-09-06 ENCOUNTER — Ambulatory Visit (INDEPENDENT_AMBULATORY_CARE_PROVIDER_SITE_OTHER): Payer: BC Managed Care – PPO | Admitting: Family Medicine

## 2017-09-06 VITALS — BP 112/72 | HR 64 | Temp 97.7°F | Ht 66.0 in | Wt 235.8 lb

## 2017-09-06 DIAGNOSIS — R202 Paresthesia of skin: Secondary | ICD-10-CM | POA: Diagnosis not present

## 2017-09-06 DIAGNOSIS — L989 Disorder of the skin and subcutaneous tissue, unspecified: Secondary | ICD-10-CM | POA: Diagnosis not present

## 2017-09-06 NOTE — Assessment & Plan Note (Signed)
Abnormal sensation of left scalp..chronic since fall but worsening... Pt very distraught about issue , tearful at times  Not clearly related to cervical spine issue.. I do not have ORTHO notes to review  Their thoughts on relationship but I encouraged pt to consider the recommend epidural steroid injection as this would at lest improve cervical spine assoicated pain. Chronic abnormal sensation may be related to injury at fall, but there was no direct hit to head/scalp. No clearly related to cyst or to underlying  Small meningioma seen on MRI.   Given pt concern level and fact issue is worsening.. Will refer to neuro for further eval.

## 2017-09-06 NOTE — Assessment & Plan Note (Signed)
No clear relationship between this and tingling issue.Marland Kitchen Unclear if seroma or cyst with associated small clot. No sign of infeciton. After I and D and dramatic decrease in size of lesion... Slight abnormal snestion surrounging.  pt reassured this will likely improve with time.

## 2017-09-06 NOTE — Telephone Encounter (Signed)
Can you please look into other options for this patients neuro referral?

## 2017-09-06 NOTE — Progress Notes (Signed)
Subjective:    Patient ID: Kimberly Patton, female    DOB: 1956-04-15, 62 y.o.   MRN: [id number]  HPI   62 year old female with chronic neck pain following a fall 3 years ago presents with several years of  intermittent tingling in n right scalp. Treated by workers comp.  Felt likely occipital nerve nerve damage.  MRI cervical spine:  C3-C4: There is mild degenerative disc disease with a mild disc bulge. Mild bilateral neuroforaminal stenosis. No spinal stenosis.  C4-C5: There is a mild disc bulge. There is mild bilateral facet arthritis. There is no spinal stenosis. There is mild bilateral neuroforaminal stenosis  C5-C6: There is mild degenerative disc disease with mild disc bulge. Mild facet arthritis. No spinal stenosis. Mild bilateral neuroforaminal stenosis.   MRI Brain: IMPRESSION: 1. 1.9 x 1.8 x 1.7 cm meningioma at the left frontoparietal convexity. No associated edema. 2. Otherwise normal brain MRI for patient age. 3. 2.6 x 1.8 cm lobulated nonenhancing lesion at the left frontoparietal scalp, suspected to reflect a sebaceous cyst. Correlation with physical exam recommended.   Dr. Ace Gins was planning on trial of steroid injection in cervical spine.  Pt also noted swelling on scalp.. Increased in size.. felt likely a cyst.  Sore to touch.  Seen at Urgent Care 3/5   I and D.. Told it was a seroma.. Blood clot and fluid removed.  Treated with anitbiotoic and stapled closes.  She wants to know if tingling in scalp is from seroma of head.    Review of Systems  Constitutional: Negative for fatigue and fever.  HENT: Negative for congestion.   Eyes: Negative for pain.  Respiratory: Negative for cough and shortness of breath.   Cardiovascular: Negative for chest pain, palpitations and leg swelling.  Gastrointestinal: Negative for abdominal pain.  Genitourinary: Negative for dysuria and vaginal bleeding.  Musculoskeletal: Positive for back pain and neck pain.    Neurological: Positive for numbness. Negative for syncope, light-headedness and headaches.  Psychiatric/Behavioral: Negative for dysphoric mood.       Objective:   Physical Exam  Constitutional: She is oriented to person, place, and time. Vital signs are normal. She appears well-developed and well-nourished. She is cooperative.  Non-toxic appearance. She does not appear ill. No distress.  Morbidly obese female in NAD  HENT:  Head: Normocephalic.  Right Ear: Hearing, tympanic membrane, external ear and ear canal normal. Tympanic membrane is not erythematous, not retracted and not bulging.  Left Ear: Hearing, tympanic membrane, external ear and ear canal normal. Tympanic membrane is not erythematous, not retracted and not bulging.  Nose: No mucosal edema or rhinorrhea. Right sinus exhibits no maxillary sinus tenderness and no frontal sinus tenderness. Left sinus exhibits no maxillary sinus tenderness and no frontal sinus tenderness.  Mouth/Throat: Uvula is midline, oropharynx is clear and moist and mucous membranes are normal.  Eyes: Pupils are equal, round, and reactive to light. Conjunctivae, EOM and lids are normal. Lids are everted and swept, no foreign bodies found.  Neck: Trachea normal and normal range of motion. Neck supple. Carotid bruit is not present. No thyroid mass and no thyromegaly present.  Cardiovascular: Normal rate, regular rhythm, S1 normal, S2 normal, normal heart sounds, intact distal pulses and normal pulses. Exam reveals no gallop and no friction rub.  No murmur heard. Pulmonary/Chest: Effort normal and breath sounds normal. No tachypnea. No respiratory distress. She has no decreased breath sounds. She has no wheezes. She has no rhonchi. She has no rales.  Abdominal: Soft. Normal appearance and bowel sounds are normal. There is no tenderness.  Musculoskeletal:       Cervical back: She exhibits decreased range of motion and tenderness. She exhibits no bony tenderness.    ttp at bilateral occiput  neg spurling.  Neurological: She is alert and oriented to person, place, and time. She has normal strength. A sensory deficit is present. No cranial nerve deficit. She displays a negative Romberg sign. Gait normal.   Abnormal sensation ( tingling) around cyst/seroma left scalp.  Entire left side of scalp with tingling and sensation change that has been presents off and on since fall.   Skin: Skin is warm, dry and intact. No rash noted.  Left scalp: lima bean size mobile soft lesion, feels like cyst, no redness, no heat.  Psychiatric: Her speech is normal and behavior is normal. Judgment and thought content normal. Her mood appears not anxious. Cognition and memory are normal. She does not exhibit a depressed mood.          Assessment & Plan:

## 2017-09-06 NOTE — Patient Instructions (Signed)
Please stop at the front desk to set up referral.  

## 2017-09-06 NOTE — Telephone Encounter (Signed)
Copied from [place]. Topic: Referral - Question >> Sep 06, 2017  1:34 PM Neva Seat wrote: Pt requesting another referral due to the referred office cannot see her until July or August. Please call pt back to discuss.

## 2017-09-09 NOTE — Telephone Encounter (Signed)
Spoke with patient Kimberly Patton office scheduling in June and [facility] Neuro starting in May, patient wanted to proceed with [facility] neuro, patient advised that their office will call her to [place], RMA

## 2017-09-09 NOTE — Telephone Encounter (Signed)
Working on this and then will reach out to the patient once I know the information needed-Kimberly Patton [employer], RMA

## 2017-09-30 ENCOUNTER — Other Ambulatory Visit (INDEPENDENT_AMBULATORY_CARE_PROVIDER_SITE_OTHER): Payer: BC Managed Care – PPO

## 2017-09-30 ENCOUNTER — Encounter: Payer: Self-pay | Admitting: Family Medicine

## 2017-09-30 ENCOUNTER — Other Ambulatory Visit: Payer: Self-pay

## 2017-09-30 ENCOUNTER — Ambulatory Visit: Payer: BC Managed Care – PPO | Admitting: Family Medicine

## 2017-09-30 VITALS — BP 100/60 | HR 74 | Temp 98.5°F | Ht 66.0 in | Wt 237.5 lb

## 2017-09-30 DIAGNOSIS — J0100 Acute maxillary sinusitis, unspecified: Secondary | ICD-10-CM

## 2017-09-30 DIAGNOSIS — I10 Essential (primary) hypertension: Secondary | ICD-10-CM | POA: Diagnosis not present

## 2017-09-30 DIAGNOSIS — E785 Hyperlipidemia, unspecified: Secondary | ICD-10-CM | POA: Diagnosis not present

## 2017-09-30 DIAGNOSIS — M818 Other osteoporosis without current pathological fracture: Secondary | ICD-10-CM

## 2017-09-30 DIAGNOSIS — E538 Deficiency of other specified B group vitamins: Secondary | ICD-10-CM

## 2017-09-30 DIAGNOSIS — H6592 Unspecified nonsuppurative otitis media, left ear: Secondary | ICD-10-CM | POA: Diagnosis not present

## 2017-09-30 DIAGNOSIS — D508 Other iron deficiency anemias: Secondary | ICD-10-CM | POA: Diagnosis not present

## 2017-09-30 LAB — COMPREHENSIVE METABOLIC PANEL
ALT: 12 U/L (ref 0–35)
AST: 16 U/L (ref 0–37)
Albumin: 3.9 g/dL (ref 3.5–5.2)
Alkaline Phosphatase: 72 U/L (ref 39–117)
BUN: 15 mg/dL (ref 6–23)
CO2: 30 mEq/L (ref 19–32)
Calcium: 9.5 mg/dL (ref 8.4–10.5)
Chloride: 99 mEq/L (ref 96–112)
Creatinine, Ser: 0.49 mg/dL (ref 0.40–1.20)
GFR: 136.04 mL/min (ref >60.00)
Glucose, Bld: 84 mg/dL (ref 70–99)
Potassium: 3.4 mEq/L — ABNORMAL LOW (ref 3.5–5.1)
Sodium: 139 mEq/L (ref 135–145)
Total Bilirubin: 0.5 mg/dL (ref 0.2–1.2)
Total Protein: 6.9 g/dL (ref 6.0–8.3)

## 2017-09-30 LAB — IBC PANEL
Iron: 45 ug/dL (ref 42–145)
Saturation Ratios: 10 % — ABNORMAL LOW (ref 20.0–50.0)
Transferrin: 322 mg/dL (ref 212.0–360.0)

## 2017-09-30 LAB — LIPID PANEL
Cholesterol: 182 mg/dL (ref 0–200)
HDL: 60.3 mg/dL (ref >39.00)
LDL Cholesterol: 104 mg/dL — ABNORMAL HIGH (ref 0–99)
NonHDL: 121.46
Total CHOL/HDL Ratio: 3
Triglycerides: 85 mg/dL (ref 0.0–149.0)
VLDL: 17 mg/dL (ref 0.0–40.0)

## 2017-09-30 LAB — CBC WITH DIFFERENTIAL/PLATELET
Basophils Absolute: 0.1 10*3/uL (ref 0.0–0.1)
Basophils Relative: 1 % (ref 0.0–3.0)
Eosinophils Absolute: 0.1 10*3/uL (ref 0.0–0.7)
Eosinophils Relative: 2.5 % (ref 0.0–5.0)
HCT: 34.6 % — ABNORMAL LOW (ref 36.0–46.0)
Hemoglobin: 11.5 g/dL — ABNORMAL LOW (ref 12.0–15.0)
Lymphocytes Relative: 24.7 % (ref 12.0–46.0)
Lymphs Abs: 1.4 10*3/uL (ref 0.7–4.0)
MCHC: 33.1 g/dL (ref 30.0–36.0)
MCV: 77.7 fl — ABNORMAL LOW (ref 78.0–100.0)
Monocytes Absolute: 0.6 10*3/uL (ref 0.1–1.0)
Monocytes Relative: 10.4 % (ref 3.0–12.0)
Neutro Abs: 3.4 10*3/uL (ref 1.4–7.7)
Neutrophils Relative %: 61.4 % (ref 43.0–77.0)
Platelets: 287 10*3/uL (ref 150.0–400.0)
RBC: 4.45 Mil/uL (ref 3.87–5.11)
RDW: 15.4 % (ref 11.5–15.5)
WBC: 5.5 10*3/uL (ref 4.0–10.5)

## 2017-09-30 LAB — VITAMIN D 25 HYDROXY (VIT D DEFICIENCY, FRACTURES): VITD: 33.36 ng/mL (ref 30.00–100.00)

## 2017-09-30 LAB — FERRITIN: Ferritin: 7.3 ng/mL — ABNORMAL LOW (ref 10.0–291.0)

## 2017-09-30 LAB — VITAMIN B12: Vitamin B-12: >1500 pg/mL — ABNORMAL HIGH (ref 211–911)

## 2017-09-30 MED ORDER — AMOXICILLIN 500 MG PO CAPS: 1000.0000 mg | ORAL_CAPSULE | Freq: Two times a day (BID) | ORAL | 0 refills | Status: AC

## 2017-09-30 NOTE — Progress Notes (Signed)
Dr. Frederico Hamman T. Rossi Silvestro, MD, [facility] [place], [address] Phone: [phone] Fax: [phone]  09/30/2017  Patient: Kimberly Patton, MRN: [id number], DOB: 1956/02/06, 62 y.o.  Primary Physician:  Jinny Sanders, MD   Chief Complaint  Patient presents with  . Nasal Congestion  . Sinus pressure  . Headache  . Ears Stopped Up  . Watery Eyes   Subjective:   This 62 y.o. female patient presents with runny nose, sneezing, cough, sore throat, malaise and minimal / low-grade fever for > 1 week. Now the primary complaint has become sinus pressure and pain behind the eyes and in the upper, anterior face. L ear pain has gotten worse and worse in the last few days.  The patent denies sore throat as the primary complaint. Denies sthortness of breath/wheezing, high fever, chest pain, significant myalgia, abdominal pain, changes in bowel or bladder.  PMH, PHS, Allergies, Problem List, Medications, Family History, and Social History have all been reviewed.  Patient Active Problem List   Diagnosis Date Noted  . Skin lesion of scalp 09/06/2017  . Tingling 09/06/2017  . Dizziness 06/08/2017  . Sinusitis 06/08/2017  . Hypokalemia 06/08/2017  . Anemia 06/08/2017  . Osteoporosis 12/04/2016  . Rib pain on right side 09/11/2016  . RUQ pain 09/11/2016  . Osteoarthritis of knee 07/17/2016  . Meningioma ([place]) 01/18/2016  . Family history of aortic stenosis 01/20/2015  . Viral URI with cough 07/30/2014  . Status post laparoscopic sleeve gastrectomy Dec 2014 06/08/2013  . Body mass index (BMI) of 36.0-36.9 in adult 12/08/2012  . HTN (hypertension) 10/11/2011  . Varicose veins of lower extremities with other complications 96/22/2979  . FIBROIDS, UTERUS 04/03/2007  . ALLERGIC RHINITIS 04/03/2007  . Asthma, mild intermittent 04/03/2007    Past Medical History:  Diagnosis Date  . ALLERGIC RHINITIS 04/03/2007  . ASTHMA,  PERSISTENT, MODERATE 04/03/2007  . Dyspnea on exertion    a. 11/2007 Echo: EF 60%.  Marland Kitchen GERD (gastroesophageal reflux disease)   . 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 Hypertension   . Joint pain    HIPS / LEGS  . Knee injury    RT  . Meningioma ([place])   . Morbid obesity ([place])   . Paroxysmal SVT (supraventricular tachycardia) ([place])    a. 11/2011 48h Holter: RSR, rare PVC's, occas PAC's  . Retinal tear of right eye 01/2016  . Ventral hernia     Past Surgical History:  Procedure Laterality Date  . APPENDECTOMY  1996  . BREAST CYST EXCISION Right 1994   Benign  . CESAREAN SECTION     x2  . CHOLECYSTECTOMY  1995  . EYE SURGERY  01/2016   Repair retinal tear  . [place] / 1996   x2  . KNEE ARTHROSCOPY W/ MENISCAL REPAIR  07/25/13   right knee Dr. Mardelle Matte  . LAPAROSCOPIC GASTRIC SLEEVE RESECTION N/A 06/08/2013   Procedure: LAPAROSCOPIC GASTRIC SLEEVE RESECTION AND EXCISION OF SEBACEUS CYST FROM MID CHEST takedown of incarcerated ventral hernia and primary repair endoscopy;  Surgeon: [name], MD;  Location: [facility];  Service: General;  Laterality: N/A;  . moles  06/2013   removed 2 moles from under arm and  lowback  . OVARIAN CYST REMOVAL  1970  . PILONIDAL CYST EXCISION  1975  . RETINAL TEAR REPAIR CRYOTHERAPY Right 01/2016   [place]  . SKIN CANCER EXCISION    . TONSILLECTOMY    .  UPPER GI ENDOSCOPY  06/08/2013   Procedure: UPPER GI ENDOSCOPY;  Surgeon: [name], MD;  Location: [facility];  Service: General;;  . US ECHOCARDIOGRAPHY  12/2011   WNL - EF 55-60%, mild MR, grade 1 diastolic dysnfiction (mild)  . VENTRAL HERNIA REPAIR  2000    Social History   Socioeconomic History  . Marital status: Married    Spouse name: Not on file  . Number of children: 2  . Years of education: Bachelors  . Highest education level: Not on file  Occupational History  . Occupation: Product manager: [employer]  . Financial resource strain: Not on file    . Food insecurity:    Worry: Not on file    Inability: Not on file  . Transportation needs:    Medical: Not on file    Non-medical: Not on file  Tobacco Use  . Smoking status: Never Smoker  . Smokeless tobacco: Never Used  . Tobacco comment: smoke at age [age]  Substance and Sexual Activity  . Alcohol use: Yes    Alcohol/week: 0.0 oz    Comment: Occasionally  . Drug use: No  . Sexual activity: Not on file  Lifestyle  . Physical activity:    Days per week: Not on file    Minutes per session: Not on file  . Stress: Not on file  Relationships  . Social connections:    Talks on phone: Not on file    Gets together: Not on file    Attends religious service: Not on file    Active member of club or organization: Not on file    Attends meetings of clubs or organizations: Not on file    Relationship status: Not on file  . Intimate partner violence:    Fear of current or ex partner: Not on file    Emotionally abused: Not on file    Physically abused: Not on file    Forced sexual activity: Not on file  Other Topics Concern  . Not on file  Social History Narrative   Lives at home alone.   Right-handed.   Occasional caffeine use.    Family History  Problem Relation Age of Onset  . Breast cancer Mother   . Diabetes Mother   . Hypertension Mother   . Kidney failure Mother   . Diabetes Father   . Hypertension Father   . Diabetes Unknown   . Hypertension Unknown   . Stroke Unknown   . Hemophilia Unknown     Allergies  Allergen Reactions  . Wasp Venom Anaphylaxis  . Prednisone Other (See Comments)    Severe migraine / with taper dose  . Sodium Hypochlorite Rash    Liquid clorox bleach    Medication list reviewed and updated in full in [place].  ROS as above, eating and drinking - tolerating PO. Urinating normally. No excessive vomitting or diarrhea. O/w as above.  Objective:   Blood pressure 100/60, pulse 74, temperature 98.5 F (36.9 C), temperature  source Oral, height 5\' 6"  (1.676 m), weight 237 lb 8 oz (107.7 kg).  GEN: WDWN, Non-toxic, Atraumatic, normocephalic. A and O x 3. HEENT: Oropharynx clear without exudate, MMM, no significant LAD, mild rhinnorhea Sinuses: Right Frontal, ethmoid, and maxillary: Tender Left Frontal, Ethmoid, and maxillary: Tender Ears: TM clear, COL visualized with good landmarks. L Ear is obscurred and reddish CV: RRR, no m/g/r. Pulm: CTA B, no wheezes, rhonchi, or crackles,  normal respiratory effort. EXT: no c/c/e Psych: well oriented, neither depressed nor anxious in appearance  Assessment and Plan:   Left otitis media with effusion  Acute non-recurrent maxillary sinusitis  Acute sinusitis: ABX as below.   Severe L OM on appearance Reviewed symptomatic care as well as ABX in this case.    Follow-up: No follow-ups on file.  Meds ordered this encounter  Medications  . amoxicillin (AMOXIL) 500 MG capsule    Sig: Take 2 capsules (1,000 mg total) by mouth 2 (two) times daily for 10 days.    Dispense:  40 capsule    Refill:  0   Signed,  Leaha Cuervo T. Ashden Sonnenberg, MD  Patient's Medications  New Prescriptions   AMOXICILLIN (AMOXIL) 500 MG CAPSULE    Take 2 capsules (1,000 mg total) by mouth 2 (two) times daily for 10 days.  Previous Medications   ALBUTEROL (PROAIR HFA) 108 (90 BASE) MCG/ACT INHALER    TAKE 2 PUFFS BY MOUTH EVERY 6 HOURS AS NEEDED   ASCORBIC ACID (VITAMIN C) 1000 MG TABLET    Take 1,000 mg by mouth daily.   CALCIUM CITRATE-VITAMIN D (CITRACAL + D PO)    Take 2 tablets by mouth daily.    CYANOCOBALAMIN (VITAMIN B-12 SL)    Place 1 tablet under the tongue every morning.   DILTIAZEM (CARDIZEM CD) 180 MG 24 HR CAPSULE    TAKE 1 CAPSULE (180 MG TOTAL) BY MOUTH DAILY.   FLUTICASONE (FLONASE) 50 MCG/ACT NASAL SPRAY    Place 2 sprays into both nostrils daily.   HYDROCHLOROTHIAZIDE (HYDRODIURIL) 25 MG TABLET    TAKE 1 TABLET BY MOUTH EVERY DAY   NORTRIPTYLINE (PAMELOR) 10 MG CAPSULE    Start  Nortriptyline (Pamelor) 10 mg nightly for one week, then increase to 20 mg nightly   PANTOPRAZOLE (PROTONIX) 40 MG TABLET    TAKE 1 TABLET BY MOUTH EVERY DAY  Modified Medications   No medications on file  Discontinued Medications   No medications on file

## 2017-10-03 ENCOUNTER — Encounter: Payer: Self-pay | Admitting: *Deleted

## 2017-10-04 ENCOUNTER — Encounter: Payer: Self-pay | Admitting: Family Medicine

## 2017-10-04 ENCOUNTER — Other Ambulatory Visit: Payer: Self-pay

## 2017-10-04 ENCOUNTER — Ambulatory Visit (INDEPENDENT_AMBULATORY_CARE_PROVIDER_SITE_OTHER): Payer: BC Managed Care – PPO | Admitting: Family Medicine

## 2017-10-04 VITALS — BP 90/60 | HR 71 | Temp 97.7°F | Ht 65.75 in | Wt 234.5 lb

## 2017-10-04 DIAGNOSIS — I10 Essential (primary) hypertension: Secondary | ICD-10-CM | POA: Diagnosis not present

## 2017-10-04 DIAGNOSIS — Z0000 Encounter for general adult medical examination without abnormal findings: Secondary | ICD-10-CM | POA: Diagnosis not present

## 2017-10-04 DIAGNOSIS — E661 Drug-induced obesity: Secondary | ICD-10-CM

## 2017-10-04 DIAGNOSIS — Z6838 Body mass index (BMI) 38.0-38.9, adult: Secondary | ICD-10-CM

## 2017-10-04 DIAGNOSIS — Z1211 Encounter for screening for malignant neoplasm of colon: Secondary | ICD-10-CM

## 2017-10-04 DIAGNOSIS — S299XXA Unspecified injury of thorax, initial encounter: Secondary | ICD-10-CM

## 2017-10-04 NOTE — Patient Instructions (Addendum)
Follow BP at ome.. Call if persistently low, or if lightheaded with standing.  Call to set up yearly mammogram.  Please stop at the lab to pisck up stool test..

## 2017-10-04 NOTE — Assessment & Plan Note (Signed)
Running low.. Will follow at home to determine if need to decrease meds.

## 2017-10-04 NOTE — Progress Notes (Signed)
Subjective:    Patient ID: Kimberly Patton, female    DOB: 03-06-1956, 62 y.o.   MRN: [id number]  HPI The patient is here for annual wellness exam and preventative care.     She saw Dr. [name] neuro.. Dx with chronic neck pain, tension headaches etc.  Started on nortriptyline.. gradually to increase. Has not noted change yet.  She is currently seeing PT.  She is also getting dry needling at integrative therapy.  She has had some improvement in tingling and pain.  Has follow up in 4 weeks.   She is currently  Improving on Amox for sinusitis.  Hypertension:  Lower BP on diltizem, HCTZ BP Readings from Last 3 Encounters:  10/04/17 90/60  09/30/17 100/60  09/06/17 112/72  Using medication without problems or lightheadedness:  none Chest pain with exertion: none Edema: yes Short of breath: none Average home BPs: Other issues:  Cholesterol: Low risk.. No indication for statin. Diet compliance: keto Exercise: limited Other complaints:  Asthma, mild intermittant: no flares in last few years.   morbidly obese   healing rib cage injury, bruising on right upper chest wall.  Social History /Family History/Past Medical History reviewed in detail and updated in EMR if needed. Blood pressure 90/60, pulse 71, temperature 97.7 F (36.5 C), temperature source Oral, height 5' 5.75" (1.67 m), weight 234 lb 8 oz (106.4 kg).  Review of Systems  Constitutional: Negative for fatigue and fever.  HENT: Negative for congestion.   Eyes: Negative for pain.  Respiratory: Negative for cough and shortness of breath.   Cardiovascular: Negative for chest pain, palpitations and leg swelling.  Gastrointestinal: Negative for abdominal pain.  Genitourinary: Negative for dysuria and vaginal bleeding.  Musculoskeletal: Positive for back pain and neck pain.       Ttp in left rib cage after injury  Neurological: Negative for syncope, light-headedness and headaches.  Psychiatric/Behavioral: Negative  for dysphoric mood.       Objective:   Physical Exam  Constitutional: Vital signs are normal. She appears well-developed and well-nourished. She is cooperative.  Non-toxic appearance. She does not appear ill. No distress.  HENT:  Head: Normocephalic.  Right Ear: Hearing, tympanic membrane, external ear and ear canal normal.  Left Ear: Hearing, tympanic membrane, external ear and ear canal normal.  Nose: Nose normal.  Eyes: Pupils are equal, round, and reactive to light. Conjunctivae, EOM and lids are normal. Lids are everted and swept, no foreign bodies found.  Neck: Trachea normal and normal range of motion. Neck supple. Carotid bruit is not present. No thyroid mass and no thyromegaly present.  Cardiovascular: Normal rate, regular rhythm, S1 normal, S2 normal, normal heart sounds and intact distal pulses. Exam reveals no gallop.  No murmur heard. Pulmonary/Chest: Effort normal and breath sounds normal. No respiratory distress. She has no wheezes. She has no rhonchi. She has no rales. She exhibits tenderness and bony tenderness. She exhibits no mass. No breast tenderness, discharge or bleeding.    Abdominal: Soft. Normal appearance and bowel sounds are normal. She exhibits no distension, no fluid wave, no abdominal bruit and no mass. There is no hepatosplenomegaly. There is no tenderness. There is no rebound, no guarding and no CVA tenderness. No hernia.  Genitourinary: Vagina normal and uterus normal. No breast tenderness, discharge or bleeding. Pelvic exam was performed with patient supine. There is no rash, tenderness or lesion on the right labia. There is no rash, tenderness or lesion on the left labia. Uterus is not  enlarged and not tender. Cervix exhibits no motion tenderness, no discharge and no friability. Right adnexum displays no mass, no tenderness and no fullness. Left adnexum displays no mass, no tenderness and no fullness.  Lymphadenopathy:    She has no cervical adenopathy.     She has no axillary adenopathy.  Neurological: She is alert. She has normal strength. No cranial nerve deficit or sensory deficit.  Skin: Skin is warm, dry and intact. No rash noted.     Healing contusion left upper rib cage  Psychiatric: Her speech is normal and behavior is normal. Judgment normal. Her mood appears not anxious. Cognition and memory are normal. She does not exhibit a depressed mood.          Assessment & Plan:  The patient's preventative maintenance and recommended screening tests for an annual wellness exam were reviewed in full today. Brought up to date unless services declined.  Counselled on the importance of diet, exercise, and its role in overall health and mortality. The patient's FH and SH was reviewed, including their home life, tobacco status, and drug and alcohol status.   Vaccines:uptodate flu and td. Due for shingles vaccine..refused Pap/DVE:  2016  neg pap, neg HPV.Marland Kitchen Repeat in 2021, no family history of ovarian/ovarian cancer.Marland Kitchen No DVE. Mammo: 12/2016, plan to set up Bone Density:  osteoporosis 2018 contraindications for bisphosphonate, considering other options .. recheck in 2020 Colon: ifob neg 2018, due now Smoking Status:nonsmoker ETOH/ drug use:  Rare wine/no drugs Hep C: done HIV screen: done  Body mass index is 38.14 kg/m.

## 2017-10-04 NOTE — Assessment & Plan Note (Signed)
Healing bruising, likely rib fracture or contusion.

## 2017-10-04 NOTE — Assessment & Plan Note (Signed)
Encouraged exercise, weight loss, healthy eating habits. ? ?

## 2017-10-08 ENCOUNTER — Other Ambulatory Visit: Payer: Self-pay | Admitting: Family Medicine

## 2017-10-12 ENCOUNTER — Other Ambulatory Visit: Payer: Self-pay | Admitting: Family Medicine

## 2017-10-13 ENCOUNTER — Encounter: Payer: Self-pay | Admitting: Family Medicine

## 2017-10-22 ENCOUNTER — Other Ambulatory Visit (INDEPENDENT_AMBULATORY_CARE_PROVIDER_SITE_OTHER): Payer: BC Managed Care – PPO

## 2017-10-22 DIAGNOSIS — Z1211 Encounter for screening for malignant neoplasm of colon: Secondary | ICD-10-CM | POA: Diagnosis not present

## 2017-10-22 LAB — FECAL OCCULT BLOOD, IMMUNOCHEMICAL: Fecal Occult Bld: NEGATIVE

## 2017-11-14 ENCOUNTER — Other Ambulatory Visit: Payer: Self-pay | Admitting: Family Medicine

## 2017-12-25 ENCOUNTER — Other Ambulatory Visit: Payer: Self-pay | Admitting: Family Medicine

## 2017-12-25 DIAGNOSIS — Z1231 Encounter for screening mammogram for malignant neoplasm of breast: Secondary | ICD-10-CM

## 2018-01-08 ENCOUNTER — Encounter

## 2018-01-08 ENCOUNTER — Ambulatory Visit: Payer: BC Managed Care – PPO | Admitting: Neurology

## 2018-01-20 ENCOUNTER — Ambulatory Visit
Admission: RE | Admit: 2018-01-20 | Discharge: 2018-01-20 | Disposition: A | Discharge status: Home / Self Care | Payer: BC Managed Care – PPO | Source: Ambulatory Visit | Attending: Family Medicine | Admitting: Family Medicine

## 2018-01-20 DIAGNOSIS — Z1231 Encounter for screening mammogram for malignant neoplasm of breast: Secondary | ICD-10-CM

## 2018-03-07 ENCOUNTER — Ambulatory Visit: Payer: BC Managed Care – PPO | Admitting: Internal Medicine

## 2018-03-07 ENCOUNTER — Encounter: Payer: Self-pay | Admitting: Internal Medicine

## 2018-03-07 VITALS — BP 110/74 | HR 76 | Temp 97.8°F | Wt 226.0 lb

## 2018-03-07 DIAGNOSIS — J0100 Acute maxillary sinusitis, unspecified: Secondary | ICD-10-CM | POA: Diagnosis not present

## 2018-03-07 MED ORDER — AMOXICILLIN-POT CLAVULANATE 875-125 MG PO TABS: 1.0000 | ORAL_TABLET | Freq: Two times a day (BID) | ORAL | 0 refills | Status: DC

## 2018-03-07 NOTE — Progress Notes (Signed)
HPI  Pt presents to the clinic today with c/o facial pain and pressure and nasal congestion. She reports this started 2-3 weeks ago. She is not able to blow anything out of her nose. She denies runny nose, ear pain, sore throat or cough. She denies fever, chills or body aches. She has tried Mucinex, and Sudafed with minimal relief. She has a history of allergies and asthma. She has not had sick contacts.  Review of Systems     Past Medical History:  Diagnosis Date  . ALLERGIC RHINITIS 04/03/2007  . ASTHMA, PERSISTENT, MODERATE 04/03/2007  . Dyspnea on exertion    a. 11/2007 Echo: EF 60%.  Marland Kitchen GERD (gastroesophageal reflux disease)   . 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 Hypertension   . Joint pain    HIPS / LEGS  . Knee injury    RT  . Meningioma ([place])   . Morbid obesity ([place])   . Paroxysmal SVT (supraventricular tachycardia) ([place])    a. 11/2011 48h Holter: RSR, rare PVC's, occas PAC's  . Retinal tear of right eye 01/2016  . Ventral hernia     Family History  Problem Relation Age of Onset  . Breast cancer Mother   . Diabetes Mother   . Hypertension Mother   . Kidney failure Mother   . Diabetes Father   . Hypertension Father   . Diabetes Unknown   . Hypertension Unknown   . Stroke Unknown   . Hemophilia Unknown     Social History   Socioeconomic History  . Marital status: Married    Spouse name: Not on file  . Number of children: 2  . Years of education: Bachelors  . Highest education level: Not on file  Occupational History  . Occupation: Product manager: [employer]  . Financial resource strain: Not on file  . Food insecurity:    Worry: Not on file    Inability: Not on file  . Transportation needs:    Medical: Not on file    Non-medical: Not on file  Tobacco Use  . Smoking status: Never Smoker  . Smokeless tobacco: Never Used  . Tobacco comment: smoke at age [age]  Substance and Sexual Activity  . Alcohol use: Yes    Alcohol/week: 0.0  standard drinks    Comment: Occasionally  . Drug use: No  . Sexual activity: Not on file  Lifestyle  . Physical activity:    Days per week: Not on file    Minutes per session: Not on file  . Stress: Not on file  Relationships  . Social connections:    Talks on phone: Not on file    Gets together: Not on file    Attends religious service: Not on file    Active member of club or organization: Not on file    Attends meetings of clubs or organizations: Not on file    Relationship status: Not on file  . Intimate partner violence:    Fear of current or ex partner: Not on file    Emotionally abused: Not on file    Physically abused: Not on file    Forced sexual activity: Not on file  Other Topics Concern  . Not on file  Social History Narrative   Lives at home alone.   Right-handed.   Occasional caffeine use.    Allergies  Allergen Reactions  . Wasp Venom Anaphylaxis  . Prednisone Other (See Comments)    Severe migraine /  with taper dose  . Sodium Hypochlorite Rash    Liquid clorox bleach     Constitutional:  Denies headache, fatigue, fever or abrupt weight changes.  HEENT:  Positive facial pain, nasal congestion. Denies eye redness, ear pain, ringing in the ears, wax buildup, runny nose or sore throat. Respiratory: Denies cough, difficulty breathing or shortness of breath.  Cardiovascular: Denies chest pain, chest tightness, palpitations or swelling in the hands or feet.   No other specific complaints in a complete review of systems (except as listed in HPI above).  Objective:   BP 110/74   Pulse 76   Temp 97.8 F (36.6 C) (Oral)   Wt 226 lb (102.5 kg)   SpO2 98%   BMI 36.76 kg/m   General: Appears her stated age, well developed, well nourished in NAD. HEENT: Head: normal shape and size, maxillary sinus tenderness noted;  Ears: Tm's gray and intact, normal light reflex, + effusion noted on the left; Nose: mucosa boggy and moist, septum midline; Throat/Mouth: +  PND. Teeth present, mucosa erythematous and moist, no exudate noted, no lesions or ulcerations noted.  Neck:  No adenopathy noted.  Pulmonary/Chest: Normal effort and positive vesicular breath sounds. No respiratory distress. No wheezes, rales or ronchi noted.       Assessment & Plan:   Acute Maxillary Sinusitis  Can use a Neti Pot which can be purchased from your local drug store. Flonase 2 sprays each nostril for 3 days and then as needed. eRx for Augmentin BID for 10 days 80 mg Depo IM today  RTC as needed or if symptoms persist. Webb Silversmith, NP

## 2018-03-07 NOTE — Patient Instructions (Signed)

## 2018-03-08 MED ORDER — METHYLPREDNISOLONE ACETATE 80 MG/ML IJ SUSP: 80.0000 mg | Freq: Once | INTRAMUSCULAR | Status: DC

## 2018-03-09 DIAGNOSIS — E041 Nontoxic single thyroid nodule: Secondary | ICD-10-CM

## 2018-03-12 ENCOUNTER — Other Ambulatory Visit: Payer: Self-pay | Admitting: Family Medicine

## 2018-03-12 DIAGNOSIS — R946 Abnormal results of thyroid function studies: Secondary | ICD-10-CM

## 2018-03-14 ENCOUNTER — Other Ambulatory Visit: Payer: Self-pay | Admitting: Family Medicine

## 2018-03-19 ENCOUNTER — Ambulatory Visit
Admission: RE | Admit: 2018-03-19 | Discharge: 2018-03-19 | Disposition: A | Discharge status: Home / Self Care | Payer: BC Managed Care – PPO | Source: Ambulatory Visit | Attending: Family Medicine | Admitting: Family Medicine

## 2018-03-19 ENCOUNTER — Other Ambulatory Visit (INDEPENDENT_AMBULATORY_CARE_PROVIDER_SITE_OTHER): Payer: BC Managed Care – PPO

## 2018-03-19 DIAGNOSIS — R946 Abnormal results of thyroid function studies: Secondary | ICD-10-CM | POA: Diagnosis not present

## 2018-03-19 DIAGNOSIS — E041 Nontoxic single thyroid nodule: Secondary | ICD-10-CM

## 2018-03-19 LAB — T4, FREE: Free T4: 1 ng/dL (ref 0.60–1.60)

## 2018-03-19 LAB — TSH: TSH: 0.86 u[IU]/mL (ref 0.35–4.50)

## 2018-03-19 LAB — T3, FREE: T3, Free: 2.8 pg/mL (ref 2.3–4.2)

## 2018-03-21 ENCOUNTER — Other Ambulatory Visit: Payer: Self-pay | Admitting: Family Medicine

## 2018-03-21 DIAGNOSIS — E041 Nontoxic single thyroid nodule: Secondary | ICD-10-CM

## 2018-04-06 ENCOUNTER — Other Ambulatory Visit: Payer: Self-pay | Admitting: Family Medicine

## 2018-04-10 ENCOUNTER — Ambulatory Visit
Admission: RE | Admit: 2018-04-10 | Discharge: 2018-04-10 | Disposition: A | Discharge status: Home / Self Care | Payer: BC Managed Care – PPO | Source: Ambulatory Visit | Attending: Family Medicine | Admitting: Family Medicine

## 2018-04-10 ENCOUNTER — Other Ambulatory Visit (HOSPITAL_COMMUNITY)
Admission: RE | Admit: 2018-04-10 | Discharge: 2018-04-10 | Disposition: A | Discharge status: Home / Self Care | Payer: BC Managed Care – PPO | Source: Ambulatory Visit | Attending: Interventional Radiology | Admitting: Interventional Radiology

## 2018-04-10 DIAGNOSIS — E041 Nontoxic single thyroid nodule: Secondary | ICD-10-CM | POA: Insufficient documentation

## 2018-04-14 DIAGNOSIS — E041 Nontoxic single thyroid nodule: Secondary | ICD-10-CM

## 2018-04-15 DIAGNOSIS — E041 Nontoxic single thyroid nodule: Secondary | ICD-10-CM | POA: Insufficient documentation

## 2018-04-15 HISTORY — DX: Nontoxic single thyroid nodule: E04.1

## 2018-05-22 ENCOUNTER — Other Ambulatory Visit: Payer: Self-pay | Admitting: *Deleted

## 2018-05-22 MED ORDER — PANTOPRAZOLE SODIUM 40 MG PO TBEC: 40.0000 mg | DELAYED_RELEASE_TABLET | Freq: Every day | ORAL | 0 refills | Status: DC

## 2018-07-31 ENCOUNTER — Ambulatory Visit: Payer: BC Managed Care – PPO | Admitting: Family Medicine

## 2018-07-31 ENCOUNTER — Encounter: Payer: Self-pay | Admitting: Family Medicine

## 2018-07-31 DIAGNOSIS — D329 Benign neoplasm of meninges, unspecified: Secondary | ICD-10-CM | POA: Diagnosis not present

## 2018-07-31 DIAGNOSIS — M792 Neuralgia and neuritis, unspecified: Secondary | ICD-10-CM | POA: Diagnosis not present

## 2018-07-31 DIAGNOSIS — L989 Disorder of the skin and subcutaneous tissue, unspecified: Secondary | ICD-10-CM

## 2018-07-31 DIAGNOSIS — E041 Nontoxic single thyroid nodule: Secondary | ICD-10-CM

## 2018-07-31 DIAGNOSIS — G8929 Other chronic pain: Secondary | ICD-10-CM

## 2018-07-31 NOTE — Assessment & Plan Note (Signed)
Needs re-eval with [place] in 04/2019

## 2018-07-31 NOTE — Patient Instructions (Addendum)
Call if head pressure not resolving after nerve block and possible increase in nortriptyline.  If it is better, call for MRI brain to check meningioma in August. [place] of thyroid 03/2019.

## 2018-07-31 NOTE — Assessment & Plan Note (Signed)
Will be due for 3 year re-eval MRI in 01/2019.  If unusual pressure in head at site of meningioma and additional neurologic changes not resolved after treatment with nerve block  In cervical spine ( for other neck pain and head pain, numbness following fall) then consider early MRI brain for re-eval.

## 2018-07-31 NOTE — Progress Notes (Signed)
Subjective:    Patient ID: Kimberly Patton, female    DOB: [birth year], 63 y.o.   MRN: [id number]  HPI    63 year old female presents for evaluation of head pressure x 2-3 weeks  She was treated for sinusitis 2/7 by urgent care. Treated with augemntin  She has history of meningioma seen on  MRI brain in 2017. She is due for reassessment   She has intermittant left sided anterior head pressure, no pain, not sharp in last 2 months.   Also has cyst of scalp which is not red, no warm. Size of lima bean.  She saw Dr. [name] Neuro in past.. Dx with chronic neck pain, tension headaches etc. Never went back. Had injury to neck.Marland Kitchen after fall 4 years ago, did not hit head. Has numbness in hans and pain behind ears. 08/11/2018 has upcoming nerve block with D.r Clement Husbands.  Started on nortriptyline.. more causes constipation.  Referred to  PT.  She is also getting dry needling at integrative therapy.  She has had some improvement in tingling and pain.  Also chronic numbness on left cheek, as well as numbness in left lower leg.. since fall.  Social History /Family History/Past Medical History reviewed in detail and updated in EMR if needed. Blood pressure 126/78, pulse 62, temperature 97.6 F (36.4 C), temperature source Oral, height 5' 5.75" (1.67 m), weight 230 lb 12 oz (104.7 kg), SpO2 99 %.   Review of Systems  Constitutional: Negative for fatigue and fever.  HENT: Negative for congestion.   Eyes: Negative for pain.  Respiratory: Negative for cough and shortness of breath.   Cardiovascular: Negative for chest pain, palpitations and leg swelling.  Gastrointestinal: Negative for abdominal pain.  Genitourinary: Negative for dysuria and vaginal bleeding.  Musculoskeletal: Positive for back pain. Negative for gait problem.  Neurological: Positive for numbness and headaches. Negative for syncope, weakness and light-headedness.  Psychiatric/Behavioral: Negative for dysphoric mood.         Objective:   Physical Exam Constitutional:      General: She is not in acute distress.    Appearance: Normal appearance. She is well-developed. She is obese. She is not ill-appearing or toxic-appearing.  HENT:     Head: Normocephalic.     Right Ear: Hearing, tympanic membrane, ear canal and external ear normal. Tympanic membrane is not erythematous, retracted or bulging.     Left Ear: Hearing, tympanic membrane, ear canal and external ear normal. Tympanic membrane is not erythematous, retracted or bulging.     Nose: No mucosal edema or rhinorrhea.     Right Sinus: No maxillary sinus tenderness or frontal sinus tenderness.     Left Sinus: No maxillary sinus tenderness or frontal sinus tenderness.     Mouth/Throat:     Pharynx: Uvula midline.  Eyes:     General: Lids are normal. Lids are everted, no foreign bodies appreciated.     Conjunctiva/sclera: Conjunctivae normal.     Pupils: Pupils are equal, round, and reactive to light.  Neck:     Musculoskeletal: Normal range of motion and neck supple.     Thyroid: No thyroid mass or thyromegaly.     Vascular: No carotid bruit.     Trachea: Trachea normal.  Cardiovascular:     Rate and Rhythm: Normal rate and regular rhythm.     Pulses: Normal pulses.     Heart sounds: Normal heart sounds, S1 normal and S2 normal. No murmur. No friction rub. No gallop.  Pulmonary:     Effort: Pulmonary effort is normal. No tachypnea or respiratory distress.     Breath sounds: Normal breath sounds. No decreased breath sounds, wheezing, rhonchi or rales.  Abdominal:     General: Bowel sounds are normal.     Palpations: Abdomen is soft.     Tenderness: There is no abdominal tenderness.  Skin:    General: Skin is warm and dry.     Findings: No rash.     Comments: 0.75 cm fluctuant are on left scalp not at are of pressure  Consistent with NONinfected sebaceous cyst Multiple Sks on scalp  no current pain to palpation on scalp  Neurological:     Mental  Status: She is alert and oriented to person, place, and time.     GCS: GCS eye subscore is 4. GCS verbal subscore is 5. GCS motor subscore is 6.     Cranial Nerves: No cranial nerve deficit.     Sensory: Sensory deficit present.     Motor: No abnormal muscle tone.     Coordination: Coordination normal.     Gait: Gait normal.     Deep Tendon Reflexes: Reflexes are normal and symmetric.     Comments: Nml cerebellar exam   No papilledema   numbness in left maxilaly area, numbness in lef tlower leg    Psychiatric:        Mood and Affect: Mood is not anxious or depressed.        Speech: Speech normal.        Behavior: Behavior normal. Behavior is cooperative.        Thought Content: Thought content normal.        Cognition and Memory: Memory is not impaired. She does not exhibit impaired recent memory or impaired remote memory.        Judgment: Judgment normal.           Assessment & Plan:

## 2018-07-31 NOTE — Assessment & Plan Note (Signed)
Cyst stable noninfected and not clearly associated with issues.

## 2018-07-31 NOTE — Assessment & Plan Note (Signed)
In neck, head after fall. Upcoming nerve block in early March.  If constipation improved  With treatment , try to increase nortriptiline.

## 2018-08-22 ENCOUNTER — Other Ambulatory Visit: Payer: Self-pay | Admitting: Family Medicine

## 2018-08-27 MED ORDER — ALBUTEROL SULFATE HFA 108 (90 BASE) MCG/ACT IN AERS: INHALATION_SPRAY | RESPIRATORY_TRACT | 11 refills | Status: DC

## 2018-09-12 ENCOUNTER — Ambulatory Visit (INDEPENDENT_AMBULATORY_CARE_PROVIDER_SITE_OTHER): Payer: BC Managed Care – PPO | Admitting: Family Medicine

## 2018-09-12 ENCOUNTER — Encounter: Payer: Self-pay | Admitting: Family Medicine

## 2018-09-12 ENCOUNTER — Other Ambulatory Visit: Payer: Self-pay

## 2018-09-12 VITALS — HR 79 | Ht 65.75 in

## 2018-09-12 DIAGNOSIS — J301 Allergic rhinitis due to pollen: Secondary | ICD-10-CM

## 2018-09-12 DIAGNOSIS — H9201 Otalgia, right ear: Secondary | ICD-10-CM | POA: Insufficient documentation

## 2018-09-12 MED ORDER — AMOXICILLIN 500 MG PO CAPS: 1000.0000 mg | ORAL_CAPSULE | Freq: Two times a day (BID) | ORAL | 0 refills | Status: DC

## 2018-09-12 NOTE — Patient Instructions (Addendum)
It was nice talking to you today! Start antihistmine Xyzal at bedtime. Continue  Flonase. Add nasal saline spray or irrigaiton 2-3 times daily. Wear mask when outside in pollen. If ear pain not improving in 4-5 days or new fever... complete course of antibitoics.

## 2018-09-12 NOTE — Assessment & Plan Note (Signed)
Start antihistmine Xyzal at bedtime.  Continue  Flonase. Add nasal saline spray or irrigaiton 2-3 times daily. Wear mask when outside in pollen.

## 2018-09-12 NOTE — Assessment & Plan Note (Signed)
Likely due to fluid but given severe ear pain... will treat allergies and ETD .Marland Kitchen. if not improving treat with amoxicillin.. prescription sent in.

## 2018-09-12 NOTE — Progress Notes (Signed)
VIRTUAL VISIT Due to national recommendations of social distancing due to [place] 19, a virtual visit is felt to be most appropriate for this patient at this time.   I connected with Kimberly Patton on 09/12/18 at  2:00 PM EDT by [facility] and verified that I am speaking with the correct person using two identifiers.   I discussed the limitations, risks, security and privacy concerns of performing an evaluation and management service by [facility] and the availability of in person appointments. I also discussed with the patient that there may be a patient responsible charge related to this service. The patient expressed understanding and agreed to proceed.  Patient location: Home Provider Location:  [facility] Participants: Kimberly Patton and Myles Gip   Chief Complaint  Patient presents with  . Sinus Drainage  . Ear Pain    Right    History of Present Illness: Sinus Problem  This is a new problem. The current episode started in the past 7 days (3-4 days). The problem has been gradually worsening since onset. There has been no fever. Associated symptoms include congestion, ear pain, headaches and a sore throat. Pertinent negatives include no chills, coughing, neck pain, shortness of breath, sinus pressure or sneezing. ( Right ear pain in last 24 hours  post nasal drip and drainage  asthma not acting up) Past treatments include oral decongestants (nasal steroid spray daily, not using antihistmaine). The treatment provided no relief.     COVID 19 screen No recent travel or known exposure to [place]   The importance of social distancing was discussed today.   Review of Systems  Constitutional: Negative for chills.  HENT: Positive for congestion, ear pain and sore throat. Negative for sinus pressure and sneezing.   Respiratory: Negative for cough and shortness of breath.   Musculoskeletal: Negative for neck pain.  Neurological: Positive for headaches.      Past Medical History:   Diagnosis Date  . ALLERGIC RHINITIS 04/03/2007  . ASTHMA, PERSISTENT, MODERATE 04/03/2007  . Dyspnea on exertion    a. 11/2007 Echo: EF 60%.  Marland Kitchen GERD (gastroesophageal reflux disease)   . 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 Hypertension   . Joint pain    HIPS / LEGS  . Knee injury    RT  . Meningioma ([place])   . Morbid obesity ([place])   . Paroxysmal SVT (supraventricular tachycardia) ([place])    a. 11/2011 48h Holter: RSR, rare PVC's, occas PAC's  . Retinal tear of right eye 01/2016  . Ventral hernia     reports that she has never smoked. She has never used smokeless tobacco. She reports current alcohol use. She reports that she does not use drugs.   Current Outpatient Medications:  .  albuterol (PROAIR HFA) 108 (90 Base) MCG/ACT inhaler, TAKE 2 PUFFS BY MOUTH EVERY 6 HOURS AS NEEDED, Disp: 8.5 Inhaler, Rfl: 11 .  Black Elderberry 50 MG/5ML SYRP, One teaspoon by mouth daily, Disp: , Rfl:  .  Calcium Citrate-Vitamin D (CITRACAL + D PO), Take 2 tablets by mouth daily. , Disp: , Rfl:  .  Cyanocobalamin (VITAMIN B-12 SL), Place 1 tablet under the tongue every morning., Disp: , Rfl:  .  diltiazem (CARDIZEM CD) 180 MG 24 hr capsule, TAKE 1 CAPSULE BY MOUTH EVERY DAY, Disp: 90 capsule, Rfl: 1 .  fluticasone (FLONASE) 50 MCG/ACT nasal spray, SPRAY 2 SPRAYS INTO EACH NOSTRIL EVERY DAY, Disp: 48 g, Rfl: 1 .  hydrochlorothiazide (HYDRODIURIL) 25 MG tablet, TAKE 1 TABLET BY  MOUTH EVERY DAY, Disp: 90 tablet, Rfl: 1 .  Omega-3 Fatty Acids (FISH OIL) 1200 MG CAPS, Take 1 capsule by mouth daily., Disp: , Rfl:  .  pantoprazole (PROTONIX) 40 MG tablet, TAKE 1 TABLET BY MOUTH EVERY DAY, Disp: 90 tablet, Rfl: 0 .  topiramate (TOPAMAX) 15 MG capsule, Take 15 mg by mouth 2 (two) times daily., Disp: , Rfl:  .  traMADol (ULTRAM) 50 MG tablet, Take 50 mg by mouth every 8 (eight) hours as needed., Disp: , Rfl:  .  Turmeric 500 MG CAPS, Take 1 capsule by mouth daily., Disp: , Rfl:  .  vitamin C (ASCORBIC ACID) 500 MG tablet, Take  1,000 mg by mouth daily., Disp: , Rfl:   Current Facility-Administered Medications:  .  methylPREDNISolone acetate (DEPO-MEDROL) injection 80 mg, 80 mg, Intramuscular, Once, Baity, Regina W, NP   Observations/Objective: Pulse 79, height 5' 5.75" (1.67 m).  Physical Exam  Physical Exam Constitutional:      General: She is not in acute distress. Pulmonary:     Effort: Pulmonary effort is normal. No respiratory distress.  Neurological:     Mental Status: She is alert and oriented to person, place, and time.  Psychiatric:        Mood and Affect: Mood normal.        Behavior: Behavior normal.   Assessment and Plan Allergic rhinitis  Start antihistmine Xyzal at bedtime.  Continue  Flonase. Add nasal saline spray or irrigaiton 2-3 times daily. Wear mask when outside in pollen.  Ear pain, right Likely due to fluid but given severe ear pain... will treat allergies and ETD .Marland Kitchen. if not improving treat with amoxicillin.. prescription sent in.    I discussed the assessment and treatment plan with the patient. The patient was provided an opportunity to ask questions and all were answered. The patient agreed with the plan and demonstrated an understanding of the instructions.   The patient was advised to call back or seek an in-person evaluation if the symptoms worsen or if the condition fails to improve as anticipated.     Kimberly Lofts, MD

## 2018-09-20 ENCOUNTER — Other Ambulatory Visit: Payer: Self-pay | Admitting: Family Medicine

## 2018-09-24 ENCOUNTER — Other Ambulatory Visit: Payer: Self-pay

## 2018-09-24 ENCOUNTER — Ambulatory Visit: Payer: BC Managed Care – PPO | Admitting: Family Medicine

## 2018-09-24 ENCOUNTER — Ambulatory Visit: Payer: Self-pay | Admitting: *Deleted

## 2018-09-24 ENCOUNTER — Encounter: Payer: Self-pay | Admitting: Family Medicine

## 2018-09-24 VITALS — BP 102/70 | HR 77 | Temp 98.3°F | Ht 65.75 in | Wt 231.0 lb

## 2018-09-24 DIAGNOSIS — R599 Enlarged lymph nodes, unspecified: Secondary | ICD-10-CM | POA: Diagnosis not present

## 2018-09-24 DIAGNOSIS — R1310 Dysphagia, unspecified: Secondary | ICD-10-CM | POA: Diagnosis not present

## 2018-09-24 DIAGNOSIS — L049 Acute lymphadenitis, unspecified: Secondary | ICD-10-CM

## 2018-09-24 MED ORDER — DEXAMETHASONE SODIUM PHOSPHATE 100 MG/10ML IJ SOLN
10.0000 mg | Freq: Once | INTRAMUSCULAR | Status: AC
  Administered 2018-09-24: 10 mg via INTRAMUSCULAR

## 2018-09-24 NOTE — Addendum Note (Signed)
Addended by: Owens Loffler on: 09/24/2018 04:22 PM   Modules accepted: Level of Service

## 2018-09-24 NOTE — Addendum Note (Signed)
Addended by: Carter Kitten on: 09/24/2018 04:19 PM   Modules accepted: Orders

## 2018-09-24 NOTE — Telephone Encounter (Signed)
Pt has Doxy.me text 09/24/18 at 2:20.

## 2018-09-24 NOTE — Addendum Note (Signed)
Addended by: Owens Loffler on: 09/24/2018 04:25 PM   Modules accepted: Level of Service

## 2018-09-24 NOTE — Progress Notes (Signed)
Kimberly Boeh T. Kimberly Windom, MD [facility] [place] at [facility] [place], [address] Phone: [phone]  FAX: [phone]  Kimberly Patton - 63 y.o. female  MRN [id number]  Date of Birth: Aug 24, 1955  Visit Date: 09/24/2018  PCP: Jinny Sanders, MD  Referred by: Jinny Sanders, MD  Chief Complaint  Patient presents with  . Oral Swelling   Subjective:   Kimberly Patton is a 63 y.o. very pleasant female patient who presents with the following:  I was asked to urgently work this patient in by my partner Dr. Diona Browner.  She had been trying to do a telehealth visit with the patient, but felt like she needed to be evaluated face-to-face.  She had some sinus symptoms about 7 to 14 days ago, potential sinus infection.  She was also given some Xyzal.  She alerted my partner today that she was having some possible swelling in her neck.  She thought this possibly was in her face as well.  She had some subjective change with potential swallowing as well, but she appears completely in no distress currently.  She is been taking Xyzal, and she never started her dosage of amoxicillin Has been having some drainage.   Thought that it might have been some drainage.  All started last night.   Keep taking.  Decadron 10 mg ABX  Lymphadenitis  Past Medical History, Surgical History, Social History, Family History, Problem List, Medications, and Allergies have been reviewed and updated if relevant.  Patient Active Problem List   Diagnosis Date Noted  . Ear pain, right 09/12/2018  . Chronic neuropathic pain 07/31/2018  . Thyroid nodule 04/15/2018  . Chest wall injury 10/04/2017  . Skin lesion of scalp 09/06/2017  . Tingling 09/06/2017  . Hypokalemia 06/08/2017  . Anemia 06/08/2017  . Osteoporosis 12/04/2016  . Osteoarthritis of knee 07/17/2016  . Meningioma ([place]) 01/18/2016  . Family history of aortic stenosis 01/20/2015  .  Status post laparoscopic sleeve gastrectomy Dec 2014 06/08/2013  . Class 2 obesity due to excess calories with body mass index (BMI) of 38.0 to 38.9 in adult 12/08/2012  . HTN (hypertension) 10/11/2011  . Varicose veins of lower extremities with other complications 26/37/8588  . FIBROIDS, UTERUS 04/03/2007  . Allergic rhinitis 04/03/2007  . Asthma, mild intermittent 04/03/2007    Past Medical History:  Diagnosis Date  . ALLERGIC RHINITIS 04/03/2007  . ASTHMA, PERSISTENT, MODERATE 04/03/2007  . Dyspnea on exertion    a. 11/2007 Echo: EF 60%.  Marland Kitchen GERD (gastroesophageal reflux disease)   . 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 Hypertension   . Joint pain    HIPS / LEGS  . Knee injury    RT  . Meningioma ([place])   . Morbid obesity ([place])   . Paroxysmal SVT (supraventricular tachycardia) ([place])    a. 11/2011 48h Holter: RSR, rare PVC's, occas PAC's  . Retinal tear of right eye 01/2016  . Ventral hernia     Past Surgical History:  Procedure Laterality Date  . APPENDECTOMY  1996  . BREAST CYST EXCISION Right 1994   Benign  . CESAREAN SECTION     x2  . CHOLECYSTECTOMY  1995  . EYE SURGERY  01/2016   Repair retinal tear  . [place] / 1996   x2  . KNEE ARTHROSCOPY W/ MENISCAL REPAIR  07/25/13   right knee Dr. Mardelle Matte  . LAPAROSCOPIC GASTRIC SLEEVE RESECTION N/A 06/08/2013  Procedure: LAPAROSCOPIC GASTRIC SLEEVE RESECTION AND EXCISION OF SEBACEUS CYST FROM MID CHEST takedown of incarcerated ventral hernia and primary repair endoscopy;  Surgeon: [name], MD;  Location: [facility];  Service: General;  Laterality: N/A;  . moles  06/2013   removed 2 moles from under arm and  lowback  . OVARIAN CYST REMOVAL  1970  . PILONIDAL CYST EXCISION  1975  . RETINAL TEAR REPAIR CRYOTHERAPY Right 01/2016   [place]  . SKIN CANCER EXCISION    . TONSILLECTOMY    . UPPER GI ENDOSCOPY  06/08/2013   Procedure: UPPER GI ENDOSCOPY;  Surgeon: [name], MD;  Location: [facility];  Service: General;;  .  US ECHOCARDIOGRAPHY  12/2011   WNL - EF 55-60%, mild MR, grade 1 diastolic dysnfiction (mild)  . VENTRAL HERNIA REPAIR  2000    Social History   Socioeconomic History  . Marital status: Married    Spouse name: Not on file  . Number of children: 2  . Years of education: Bachelors  . Highest education level: Not on file  Occupational History  . Occupation: Product manager: [employer]  . Financial resource strain: Not on file  . Food insecurity:    Worry: Not on file    Inability: Not on file  . Transportation needs:    Medical: Not on file    Non-medical: Not on file  Tobacco Use  . Smoking status: Never Smoker  . Smokeless tobacco: Never Used  . Tobacco comment: smoke at age [age]  Substance and Sexual Activity  . Alcohol use: Yes    Alcohol/week: 0.0 standard drinks    Comment: Occasionally  . Drug use: No  . Sexual activity: Not on file  Lifestyle  . Physical activity:    Days per week: Not on file    Minutes per session: Not on file  . Stress: Not on file  Relationships  . Social connections:    Talks on phone: Not on file    Gets together: Not on file    Attends religious service: Not on file    Active member of club or organization: Not on file    Attends meetings of clubs or organizations: Not on file    Relationship status: Not on file  . Intimate partner violence:    Fear of current or ex partner: Not on file    Emotionally abused: Not on file    Physically abused: Not on file    Forced sexual activity: Not on file  Other Topics Concern  . Not on file  Social History Narrative   Lives at home alone.   Right-handed.   Occasional caffeine use.    Family History  Problem Relation Age of Onset  . Breast cancer Mother   . Diabetes Mother   . Hypertension Mother   . Kidney failure Mother   . Diabetes Father   . Hypertension Father   . Diabetes Unknown   . Hypertension Unknown   . Stroke Unknown   . Hemophilia  Unknown     Allergies  Allergen Reactions  . Wasp Venom Anaphylaxis  . Prednisone Other (See Comments)    Severe migraine / with taper dose  . Sodium Hypochlorite Rash    Liquid clorox bleach    Medication list reviewed and updated in full in [place].   GEN: No acute illnesses, no fevers, chills. GI: No n/v/d, eating normally Pulm: No SOB  Interactive and getting along well at home.  Otherwise, ROS is as per the HPI.  Objective:   BP 102/70   Pulse 77   Temp 98.3 F (36.8 C) (Oral)   Ht 5' 5.75" (1.67 m)   Wt 231 lb (104.8 kg)   SpO2 98%   BMI 37.57 kg/m    Gen: WDWN, NAD; A & O x3, cooperative. Pleasant.Globally Non-toxic HEENT: Normocephalic and atraumatic. Throat clear, w/o exudate, R TM clear, L TM - good landmarks, No fluid present. No rhinnorhea.  MMM  There is no appreciable swelling in the eyelids.  There is no appreciable lip swelling.  The patient's tongue and throat look completely normal without any appreciable swelling.  Nontender at the parotid glands and these have no swelling.  She is not having any respiratory distress, and she appears completely at ease.  Frontal sinuses: NT Max sinuses: NT NECK: She does have some lymphadenopathy on the left side in the anterior cervical chain, this is the area of of question and tenderness.  She also has some lymphadenopathy on the left posterior chain.  To a lesser extent there is some right side and lymphadenopathy which is mildly tender. CV: RRR, No M/G/R, cap refill <2 sec PULM: Breathing comfortably in no respiratory distress. no wheezing, crackles, rhonchi EXT: No c/c/e PSYCH: [place], good eye contact MSK: Nml gait   Laboratory and Imaging Data:  Assessment and Plan:   Lymphadenitis, acute - Plan: dexamethasone (DECADRON) injection 10 mg  Dysphagia, unspecified type  Swollen lymph nodes  Clinically, this appears to be acute lymphadenitis.  I reassured the patient.  She very clearly has  some enlarged lymph nodes, and this is the area in question in the area of tenderness.  They are fairly modestly enlarged.  I am going to go ahead and encourage the patient to take her amoxicillin 1000 mg p.o. twice daily, which she has at home for a number of 10 days.  Also getting give her Decadron IM in the office.  She has had difficulties with prednisone in the past, but she has had multiple injections of Decadron from a knee osteoarthritis standpoint.  I reviewed with her potential risk, and if she has any acute worsening, then the ER is available for her, if she has any signs of change of health or worsening respiratory symptoms or swallowing, that I would encourage her to call 911.  Follow-up: No follow-ups on file.  Meds ordered this encounter  Medications  . dexamethasone (DECADRON) injection 10 mg   Signed,  Adeola Dennen T. Kamie Korber, MD   Outpatient Encounter Medications as of 09/24/2018  Medication Sig  . albuterol (PROAIR HFA) 108 (90 Base) MCG/ACT inhaler TAKE 2 PUFFS BY MOUTH EVERY 6 HOURS AS NEEDED  . Black Elderberry 50 MG/5ML SYRP One teaspoon by mouth daily  . Calcium Citrate-Vitamin D (CITRACAL + D PO) Take 2 tablets by mouth daily.   . Cyanocobalamin (VITAMIN B-12 SL) Place 1 tablet under the tongue every morning.  . diltiazem (CARDIZEM CD) 180 MG 24 hr capsule TAKE 1 CAPSULE BY MOUTH EVERY DAY  . fluticasone (FLONASE) 50 MCG/ACT nasal spray SPRAY 2 SPRAYS INTO EACH NOSTRIL EVERY DAY  . hydrochlorothiazide (HYDRODIURIL) 25 MG tablet TAKE 1 TABLET BY MOUTH EVERY DAY  . Omega-3 Fatty Acids (FISH OIL) 1200 MG CAPS Take 1 capsule by mouth daily.  . pantoprazole (PROTONIX) 40 MG tablet TAKE 1 TABLET BY MOUTH EVERY DAY  . topiramate (TOPAMAX) 15 MG capsule Take 15 mg  by mouth 2 (two) times daily.  . traMADol (ULTRAM) 50 MG tablet Take 50 mg by mouth every 8 (eight) hours as needed.  . Turmeric 500 MG CAPS Take 1 capsule by mouth daily.  . vitamin C (ASCORBIC ACID) 500 MG  tablet Take 1,000 mg by mouth daily.  Marland Kitchen amoxicillin (AMOXIL) 500 MG capsule Take 2 capsules (1,000 mg total) by mouth 2 (two) times daily. (Patient not taking: Reported on 09/24/2018)   Facility-Administered Encounter Medications as of 09/24/2018  Medication  . [COMPLETED] dexamethasone (DECADRON) injection 10 mg  . methylPREDNISolone acetate (DEPO-MEDROL) injection 80 mg

## 2018-09-24 NOTE — Telephone Encounter (Signed)
Contacted by [name] at [facility] to have pt triaged; the pt called with complaints of problems swallowing; she says that a week ago she had a virtual visit, and was given antibiotic (did not take) and antihistamine; she says that something is enlarged in her neck, and she can feel it when she swallows starting pm 09/23/2018; ; she also feels pressure in the base of her neck; the pt says that she has a nodule in her neck; the pt can feel what something not sure if thyroid gland or swollen glands; the area of concern is on  the left side; she describes it as "tightness and pressure";the pt also says she has a cough, occassional shortness of breath, and could not finish her ususual 3 mile walk on 09/23/2018; pt answered "no" to all questions on [facility] Evaluation tool; recommendations made per nurse triage protocol; conference call initiated with Rena; per pt offered and accepted virtual visit with Dr Diona Browner, 09/24/2018 at 1420; she verbalized understanding; will route to office for notification.   Reason for Disposition . [1] Swallowing difficulty AND [2] cause unknown (Exception: difficulty swallowing is a chronic symptom)  Answer Assessment - Initial Assessment Questions 1. SYMPTOM: "Are you having difficulty swallowing liquids, solids, or both?"    no 2. ONSET: "When did the swallowing problems begin?"    09/24/2018 3. CAUSE: "What do you think is causing the problem?"     Thyroid gland vs swollen glands 4. CHRONIC/RECURRENT: "Is this a new problem for you?"  If no, ask: "How long have you had this problem?" (e.g., days, weeks, months)      no 5. OTHER SYMPTOMS: "Do you have any other symptoms?" (e.g., difficulty breathing, sore throat, swollen tongue, chest pain)   Sore throat 6. PREGNANCY: "Is there any chance you are pregnant?" "When was your last menstrual period?"    no  Protocols used: SWALLOWING DIFFICULTY-A-AH

## 2018-09-24 NOTE — Progress Notes (Signed)
VIRTUAL VISIT Due to national recommendations of social distancing due to [place] 19, a virtual visit is felt to be most appropriate for this patient at this time.   I connected with the patient on 09/24/18 at  2:20 PM EDT by virtual telehealth platform and verified that I am speaking with the correct person using two identifiers.   I discussed the limitations, risks, security and privacy concerns of performing an evaluation and management service by  virtual telehealth platform and the availability of in person appointments. I also discussed with the patient that there may be a patient responsible charge related to this service. The patient expressed understanding and agreed to proceed.  Patient location: Home Provider Location: [facility] Participants: [name] and Myles Gip   Chief Complaint  Patient presents with  . Oral Swelling    History of Present Illness: 63 year old female presetns following  Recent  OV on 4/3 with Dx of seasonal allergies.  She was started on Xyzal  And continued on flonase.  She had singificant improvement  With Xyzal.. much less congestion.   She has noted in last 24 hours more tightness in anterior neck. Today feels pressure in anterior neck, more on left than right.  No ST. No Fever, no cough. She feels some difficulty swallowing from mild tightness. No tounge swelling or lip swelling.  She has been walking 3 miles a day lately but got more short of breath. Minimal SOB. No chest pain. Has not worsened through  the day. Never took any antibiotics.   No heartburn.  hx of thyroid nodule.  COVID 19 screen No recent travel or known exposure to COVID19 The patient denies respiratory symptoms of COVID 19 at this time.  The importance of social distancing was discussed today.   ROS    Past Medical History:  Diagnosis Date  . ALLERGIC RHINITIS 04/03/2007  . ASTHMA, PERSISTENT, MODERATE 04/03/2007  . Dyspnea on exertion    a. 11/2007  Echo: EF 60%.  Marland Kitchen GERD (gastroesophageal reflux disease)   . 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 Hypertension   . Joint pain    HIPS / LEGS  . Knee injury    RT  . Meningioma ([place])   . Morbid obesity ([place])   . Paroxysmal SVT (supraventricular tachycardia) ([place])    a. 11/2011 48h Holter: RSR, rare PVC's, occas PAC's  . Retinal tear of right eye 01/2016  . Ventral hernia     reports that she has never smoked. She has never used smokeless tobacco. She reports current alcohol use. She reports that she does not use drugs.   Current Outpatient Medications:  .  albuterol (PROAIR HFA) 108 (90 Base) MCG/ACT inhaler, TAKE 2 PUFFS BY MOUTH EVERY 6 HOURS AS NEEDED, Disp: 8.5 Inhaler, Rfl: 11 .  amoxicillin (AMOXIL) 500 MG capsule, Take 2 capsules (1,000 mg total) by mouth 2 (two) times daily., Disp: 40 capsule, Rfl: 0 .  Black Elderberry 50 MG/5ML SYRP, One teaspoon by mouth daily, Disp: , Rfl:  .  Calcium Citrate-Vitamin D (CITRACAL + D PO), Take 2 tablets by mouth daily. , Disp: , Rfl:  .  Cyanocobalamin (VITAMIN B-12 SL), Place 1 tablet under the tongue every morning., Disp: , Rfl:  .  diltiazem (CARDIZEM CD) 180 MG 24 hr capsule, TAKE 1 CAPSULE BY MOUTH EVERY DAY, Disp: 90 capsule, Rfl: 1 .  fluticasone (FLONASE) 50 MCG/ACT nasal spray, SPRAY 2 SPRAYS INTO EACH NOSTRIL EVERY DAY, Disp: 48 g,  Rfl: 1 .  hydrochlorothiazide (HYDRODIURIL) 25 MG tablet, TAKE 1 TABLET BY MOUTH EVERY DAY, Disp: 90 tablet, Rfl: 1 .  Omega-3 Fatty Acids (FISH OIL) 1200 MG CAPS, Take 1 capsule by mouth daily., Disp: , Rfl:  .  pantoprazole (PROTONIX) 40 MG tablet, TAKE 1 TABLET BY MOUTH EVERY DAY, Disp: 90 tablet, Rfl: 0 .  topiramate (TOPAMAX) 15 MG capsule, Take 15 mg by mouth 2 (two) times daily., Disp: , Rfl:  .  traMADol (ULTRAM) 50 MG tablet, Take 50 mg by mouth every 8 (eight) hours as needed., Disp: , Rfl:  .  Turmeric 500 MG CAPS, Take 1 capsule by mouth daily., Disp: , Rfl:  .  vitamin C (ASCORBIC ACID) 500 MG tablet, Take  1,000 mg by mouth daily., Disp: , Rfl:   Current Facility-Administered Medications:  .  methylPREDNISolone acetate (DEPO-MEDROL) injection 80 mg, 80 mg, Intramuscular, Once, Baity, Coralie Keens, NP   Observations/Objective: There were no vitals taken for this visit.  Physical Exam  Physical Exam Constitutional:      General: She is not in acute distress. Pulmonary:     Effort: Pulmonary effort is normal. No respiratory distress.  Neurological:     Mental Status: She is alert and oriented to person, place, and time.  Psychiatric:        Mood and Affect: Mood normal.        Behavior: Behavior normal.   Assessment and Plan  Appointment transferred to office for in person evaluation given difficulty swallowing and mild SOB... needs exam and  may need depo medrol injection. Dr. Lorelei Pont made aware and agrees to see pt.  [name], MD

## 2018-10-10 ENCOUNTER — Other Ambulatory Visit: Payer: Self-pay | Admitting: Family Medicine

## 2018-11-17 ENCOUNTER — Telehealth: Payer: Self-pay | Admitting: Family Medicine

## 2018-11-17 NOTE — Telephone Encounter (Signed)
Please schedule CPE with fasting labs with Dr. Diona Browner.

## 2018-11-22 ENCOUNTER — Encounter: Payer: Self-pay | Admitting: Emergency Medicine

## 2018-11-22 ENCOUNTER — Other Ambulatory Visit: Payer: Self-pay

## 2018-11-22 DIAGNOSIS — M545 Low back pain: Secondary | ICD-10-CM | POA: Diagnosis not present

## 2018-11-22 DIAGNOSIS — M542 Cervicalgia: Secondary | ICD-10-CM | POA: Insufficient documentation

## 2018-11-22 DIAGNOSIS — J45909 Unspecified asthma, uncomplicated: Secondary | ICD-10-CM | POA: Insufficient documentation

## 2018-11-22 DIAGNOSIS — Z85828 Personal history of other malignant neoplasm of skin: Secondary | ICD-10-CM | POA: Insufficient documentation

## 2018-11-22 DIAGNOSIS — I10 Essential (primary) hypertension: Secondary | ICD-10-CM | POA: Diagnosis not present

## 2018-11-22 DIAGNOSIS — Z79899 Other long term (current) drug therapy: Secondary | ICD-10-CM | POA: Diagnosis not present

## 2018-11-22 DIAGNOSIS — M62830 Muscle spasm of back: Secondary | ICD-10-CM | POA: Insufficient documentation

## 2018-11-22 NOTE — ED Notes (Signed)
Entire triage performed by [name] RN, not Myah NT. Triage entered under [place] in error.

## 2018-11-22 NOTE — ED Triage Notes (Addendum)
Patient c/o low back pain radiating to mid back that woke her from sleep. Patient denies exercise/injury. Patient reports she was laying out in the sun all day today.   Patient  Now reports she tripped yesterday.

## 2018-11-23 ENCOUNTER — Emergency Department: Payer: No Typology Code available for payment source

## 2018-11-23 ENCOUNTER — Emergency Department
Admission: EM | Admit: 2018-11-23 | Discharge: 2018-11-23 | Disposition: A | Discharge status: Home / Self Care | Payer: No Typology Code available for payment source | Attending: Emergency Medicine | Admitting: Emergency Medicine

## 2018-11-23 DIAGNOSIS — M62830 Muscle spasm of back: Secondary | ICD-10-CM

## 2018-11-23 DIAGNOSIS — M545 Low back pain, unspecified: Secondary | ICD-10-CM

## 2018-11-23 HISTORY — DX: Malignant (primary) neoplasm, unspecified: C80.1

## 2018-11-23 MED ORDER — DEXAMETHASONE SODIUM PHOSPHATE 10 MG/ML IJ SOLN
10.0000 mg | Freq: Once | INTRAMUSCULAR | Status: AC
  Administered 2018-11-23: 10 mg via INTRAMUSCULAR
  Filled 2018-11-23: qty 1

## 2018-11-23 NOTE — ED Provider Notes (Signed)
[facility] Emergency Department Provider Note ____________________________________________   First MD Initiated Contact with Patient 11/23/18 [phone]     (approximate)  I have reviewed the triage vital signs and the nursing notes.   HISTORY  Chief Complaint Back Pain    HPI Kimberly Patton is a 63 y.o. female with PMH as noted below including chronic back and neck pain who presents with acute onset of mid and lower back pain yesterday evening while she was asleep in bed, bilateral and with shooting pains radiating towards her legs.  She denies any weakness or numbness in the upper or lower extremities.  She has no tingling.  She denies any incontinence, urinary retention, or other urinary symptoms.  She states that she fell yesterday although she hit her shoulder and did not think that she injured her back.  She states that she was lying out in the sun today and was not doing anything strenuous.  Past Medical History:  Diagnosis Date  . ALLERGIC RHINITIS 04/03/2007  . ASTHMA, PERSISTENT, MODERATE 04/03/2007  . Cancer ([place])   . Dyspnea on exertion    a. 11/2007 Echo: EF 60%.  Marland Kitchen GERD (gastroesophageal reflux disease)   . 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 Hypertension   . Joint pain    HIPS / LEGS  . Knee injury    RT  . Meningioma ([place])   . Morbid obesity ([place])   . Paroxysmal SVT (supraventricular tachycardia) ([place])    a. 11/2011 48h Holter: RSR, rare PVC's, occas PAC's  . Retinal tear of right eye 01/2016  . Ventral hernia     Patient Active Problem List   Diagnosis Date Noted  . Ear pain, right 09/12/2018  . Chronic neuropathic pain 07/31/2018  . Thyroid nodule 04/15/2018  . Chest wall injury 10/04/2017  . Skin lesion of scalp 09/06/2017  . Tingling 09/06/2017  . Hypokalemia 06/08/2017  . Anemia 06/08/2017  . Osteoporosis 12/04/2016  . Osteoarthritis of knee 07/17/2016  . Meningioma ([place]) 01/18/2016  . Family history of aortic stenosis 01/20/2015  .  Status post laparoscopic sleeve gastrectomy Dec 2014 06/08/2013  . Class 2 obesity due to excess calories with body mass index (BMI) of 38.0 to 38.9 in adult 12/08/2012  . HTN (hypertension) 10/11/2011  . Varicose veins of lower extremities with other complications 19/41/7408  . FIBROIDS, UTERUS 04/03/2007  . Allergic rhinitis 04/03/2007  . Asthma, mild intermittent 04/03/2007    Past Surgical History:  Procedure Laterality Date  . APPENDECTOMY  1996  . BREAST CYST EXCISION Right 1994   Benign  . CESAREAN SECTION     x2  . CHOLECYSTECTOMY  1995  . EYE SURGERY  01/2016   Repair retinal tear  . [place] / 1996   x2  . KNEE ARTHROSCOPY W/ MENISCAL REPAIR  07/25/13   right knee Dr. Mardelle Matte  . LAPAROSCOPIC GASTRIC SLEEVE RESECTION N/A 06/08/2013   Procedure: LAPAROSCOPIC GASTRIC SLEEVE RESECTION AND EXCISION OF SEBACEUS CYST FROM MID CHEST takedown of incarcerated ventral hernia and primary repair endoscopy;  Surgeon: [name], MD;  Location: [facility];  Service: General;  Laterality: N/A;  . moles  06/2013   removed 2 moles from under arm and  lowback  . OVARIAN CYST REMOVAL  1970  . PILONIDAL CYST EXCISION  1975  . RETINAL TEAR REPAIR CRYOTHERAPY Right 01/2016   [place]  . SKIN CANCER EXCISION    . TONSILLECTOMY    . UPPER GI ENDOSCOPY  06/08/2013   Procedure: UPPER GI ENDOSCOPY;  Surgeon: [name], MD;  Location: [facility];  Service: General;;  . US ECHOCARDIOGRAPHY  12/2011   WNL - EF 55-60%, mild MR, grade 1 diastolic dysnfiction (mild)  . VENTRAL HERNIA REPAIR  2000    Prior to Admission medications   Medication Sig Start Date End Date Taking? Authorizing Provider  albuterol (PROAIR HFA) 108 (90 Base) MCG/ACT inhaler TAKE 2 PUFFS BY MOUTH EVERY 6 HOURS AS NEEDED 08/27/18   Bedsole, Amy E, MD  amoxicillin (AMOXIL) 500 MG capsule Take 2 capsules (1,000 mg total) by mouth 2 (two) times daily. Patient not taking: Reported on 09/24/2018 09/12/18   Jinny Sanders, MD   Black Elderberry 50 MG/5ML SYRP One teaspoon by mouth daily    [provider]  Calcium Citrate-Vitamin D (CITRACAL + D PO) Take 2 tablets by mouth daily.     [provider]  Cyanocobalamin (VITAMIN B-12 SL) Place 1 tablet under the tongue every morning.    [provider]  diltiazem (CARDIZEM CD) 180 MG 24 hr capsule TAKE 1 CAPSULE BY MOUTH EVERY DAY 10/10/18   Bedsole, Amy E, MD  fluticasone (FLONASE) 50 MCG/ACT nasal spray SPRAY 2 SPRAYS INTO EACH NOSTRIL EVERY DAY 09/22/18   Bedsole, Amy E, MD  hydrochlorothiazide (HYDRODIURIL) 25 MG tablet TAKE 1 TABLET BY MOUTH EVERY DAY 04/07/18   Bedsole, Amy E, MD  Omega-3 Fatty Acids (FISH OIL) 1200 MG CAPS Take 1 capsule by mouth daily.    [provider]  pantoprazole (PROTONIX) 40 MG tablet TAKE 1 TABLET BY MOUTH EVERY DAY 11/17/18   Bedsole, Amy E, MD  topiramate (TOPAMAX) 15 MG capsule Take 15 mg by mouth 2 (two) times daily.    [provider]  traMADol (ULTRAM) 50 MG tablet Take 50 mg by mouth every 8 (eight) hours as needed.    [provider]  Turmeric 500 MG CAPS Take 1 capsule by mouth daily.    [provider]  vitamin C (ASCORBIC ACID) 500 MG tablet Take 1,000 mg by mouth daily.    [provider]    Allergies Wasp venom, Prednisone, and Sodium hypochlorite  Family History  Problem Relation Age of Onset  . Breast cancer Mother   . Diabetes Mother   . Hypertension Mother   . Kidney failure Mother   . Diabetes Father   . Hypertension Father   . Diabetes Other   . Hypertension Other   . Stroke Other   . Hemophilia Other     Social History Social History   Tobacco Use  . Smoking status: Never Smoker  . Smokeless tobacco: Never Used  . Tobacco comment: smoke at age [age]  Substance Use Topics  . Alcohol use: Yes    Alcohol/week: 0.0 standard drinks    Comment: Occasionally  . Drug use: No    Review of Systems  Constitutional: No fever. Eyes: No  redness. ENT: No acute neck pain. Cardiovascular: Denies chest pain. Respiratory: Denies shortness of breath. Gastrointestinal: No vomiting. Genitourinary: Negative for dysuria, incontinence, or retention.  Musculoskeletal: Positive for back pain. Skin: Negative for rash. Neurological: Negative for focal weakness or numbness.   ____________________________________________   PHYSICAL EXAM:  VITAL SIGNS: ED Triage Vitals [11/22/18 2346]  Enc Vitals Group     BP 110/67     Pulse Rate 80     Resp 18     Temp 97.6 F (36.4 C)  Temp Source Oral     SpO2 100 %     Weight 242 lb (109.8 kg)     Height 5\' 6"  (1.676 m)     Head Circumference      Peak Flow      Pain Score 9     Pain Loc      Pain Edu?      Excl. in [place]?     Constitutional: Alert and oriented. Well appearing and in no acute distress. Eyes: Conjunctivae are normal.  Head: Atraumatic. Nose: No congestion/rhinnorhea. Mouth/Throat: Mucous membranes are moist.   Neck: Normal range of motion.  Bilateral paraspinal tenderness which the patient states is chronic. Cardiovascular: Good peripheral circulation. Respiratory: Normal respiratory effort.  No retractions. Gastrointestinal: Soft and nontender. No distention.  Genitourinary: No flank tenderness. Musculoskeletal:  Extremities warm and well perfused.  Significant bilateral thoracolumbar paraspinal muscle tenderness with no midline tenderness, step-off, or crepitus.  No swelling, induration, or deformity. Neurologic:  Normal speech and language.  5/5 motor strength and intact sensation to upper and lower extremities, both proximally and distally.   Skin:  Skin is warm and dry. No rash noted. Psychiatric: Mood and affect are normal. Speech and behavior are normal.  ____________________________________________   LABS (all labs ordered are listed, but only abnormal results are displayed)  Labs Reviewed - No data to display  ____________________________________________  EKG   ____________________________________________  RADIOLOGY  XR lumbar spine: Degenerative disc and facet disease with no acute abnormality  ____________________________________________   PROCEDURES  Procedure(s) performed: No  Procedures  Critical Care performed: No ____________________________________________   INITIAL IMPRESSION / ASSESSMENT AND PLAN / ED COURSE  Pertinent labs & imaging results that were available during my care of the patient were reviewed by me and considered in my medical decision making (see chart for details).  63 year old female with PMH as noted above presents with acute onset of thoracic/lumbar back pain earlier this evening with some shooting pains radiating to her legs.  She has no urinary symptoms, and no weakness, numbness, or tingling.  She had a fall the other day but does not believe that she injured her back.  She has no other history of recent trauma.  On exam the patient is overall well-appearing.  Her vital signs are normal.  She states that the pain has significantly subsided although it is still present.  The primary exam finding is that the patient has bilateral paraspinal muscle tenderness in the lower thoracic/upper lumbar region.  She has no significant midline tenderness and neurologic exam is normal.  Overall presentation is consistent with lower back muscle strain/spasm rather than a herniated disc or other spinal abnormality.  The patient has no neurologic deficits and there is no indication for further imaging at this time.  She has no red flags for cauda equina and no evidence of acute trauma.  The patient has limitations in what she is able to take given that she is already on some chronic pain medication and also cannot tolerate NSAIDs.  She already takes baclofen although is on a low dose.  I instructed her to double to 10 mg of baclofen twice daily from 5 mg.  Based on  discussion with her she has had good response with IM steroids in the past (she has an allergy to prednisone listed although she states that this is an intolerance to oral steroids which tend to precipitate migraines.  She states that she can tolerate IM steroids without any issue).  I will give a dose of IM Decadron in the ED.  At this time, the patient is stable for discharge home.  Return precautions given, and she expresses understanding.   ____________________________________________   FINAL CLINICAL IMPRESSION(S) / ED DIAGNOSES  Final diagnoses:  Acute bilateral low back pain without sciatica  Muscle spasm of back      NEW MEDICATIONS STARTED DURING THIS VISIT:  Discharge Medication List as of 11/23/2018  4:30 AM       Note:  This document was prepared using Dragon voice recognition software and may include unintentional dictation errors.    Arta Silence, MD 11/23/18 [phone]

## 2018-11-23 NOTE — ED Notes (Signed)
Pt sitting in wheelchair in lobby in no acute distress.  

## 2018-11-23 NOTE — Discharge Instructions (Addendum)
As discussed, you can double the dose of your baclofen to 10 mg per dose for the next few days.  The steroid given in the ED should also help decrease the back pain and spasm.  Follow-up with your regular doctor.  Return to the ER immediately if you develop any weakness or numbness, difficulty walking or moving, urinary symptoms such as incontinence or retention, or have worsening or more severe pain.

## 2018-11-23 NOTE — ED Notes (Signed)
Pt updated on delay. Pt verbalizes understanding.  

## 2018-12-04 NOTE — Telephone Encounter (Signed)
Labs 12/22 cpx 12/29 Pt aware

## 2018-12-18 ENCOUNTER — Other Ambulatory Visit: Payer: Self-pay | Admitting: Family Medicine

## 2018-12-18 DIAGNOSIS — Z1231 Encounter for screening mammogram for malignant neoplasm of breast: Secondary | ICD-10-CM

## 2018-12-21 ENCOUNTER — Other Ambulatory Visit: Payer: Self-pay | Admitting: Family Medicine

## 2019-01-05 ENCOUNTER — Other Ambulatory Visit: Payer: Self-pay | Admitting: Family Medicine

## 2019-01-15 ENCOUNTER — Telehealth: Payer: Self-pay | Admitting: Family Medicine

## 2019-01-15 NOTE — Telephone Encounter (Signed)
Patient called and stated that she was seen in April by Dr Lorelei Pont for swollen neck/thyroid.  She stated that she is having some Sinus Drainage but feels like their is something at the bottom of her neck Patient stated she thought she previously had some thyroid problems but not sure if this is what it could be.   She was like some advice on what she should do since our schedule is full today    Patient C/B # [phone]

## 2019-01-15 NOTE — Telephone Encounter (Signed)
Does not sound urgent.. can she do a virtual tommorow?

## 2019-01-16 ENCOUNTER — Other Ambulatory Visit: Payer: Self-pay

## 2019-01-16 ENCOUNTER — Encounter: Payer: Self-pay | Admitting: Family Medicine

## 2019-01-16 ENCOUNTER — Ambulatory Visit (INDEPENDENT_AMBULATORY_CARE_PROVIDER_SITE_OTHER): Payer: BC Managed Care – PPO | Admitting: Family Medicine

## 2019-01-16 VITALS — Ht 65.75 in

## 2019-01-16 DIAGNOSIS — R221 Localized swelling, mass and lump, neck: Secondary | ICD-10-CM

## 2019-01-16 NOTE — Progress Notes (Signed)
VIRTUAL VISIT Due to national recommendations of social distancing due to [place] 19, a virtual visit is felt to be most appropriate for this patient at this time.   I connected with the patient on 01/16/19 at  3:00 PM EDT by virtual telehealth platform and verified that I am speaking with the correct person using two identifiers.   I discussed the limitations, risks, security and privacy concerns of performing an evaluation and management service by  virtual telehealth platform and the availability of in person appointments. I also discussed with the patient that there may be a patient responsible charge related to this service. The patient expressed understanding and agreed to proceed.  Patient location: Home Provider Location: [facility] Participants: [name] and Myles Gip   Chief Complaint  Patient presents with  . Swelling in Neck    History of Present Illness: 63 year old female pt with history of lymphadenitis in 09/2018 presents with swelling in neck.  She noted swelling in bilateral anterior neck mid way. Mildly tender with pressure, but not otherwise.  No redness, mild  She has gained 40LBs since 08/2018.. she has not been doing much.  In 09/2018 she was treated with amox and decadron shot   Hx of thyroid nodule 03/2018 US showed 2 nodules, 1 biopsied.. due for re-eval with [place] 04/2019   She is starting ketorolac for neck nerve issue.  COVID 19 screen No recent travel or known exposure to COVID19 The patient denies respiratory symptoms of COVID 19 at this time.  The importance of social distancing was discussed today.   Review of Systems  Constitutional: Negative for chills and fever.  HENT: Positive for congestion. Negative for ear pain.   Eyes: Negative for pain and redness.  Respiratory: Negative for cough and shortness of breath.   Cardiovascular: Negative for chest pain, palpitations and leg swelling.  Gastrointestinal: Negative for abdominal pain,  blood in stool, constipation, diarrhea, nausea and vomiting.  Genitourinary: Negative for dysuria.  Musculoskeletal: Negative for falls and myalgias.  Skin: Negative for rash.  Neurological: Negative for dizziness.  Psychiatric/Behavioral: Negative for depression. The patient is not nervous/anxious.       Past Medical History:  Diagnosis Date  . ALLERGIC RHINITIS 04/03/2007  . ASTHMA, PERSISTENT, MODERATE 04/03/2007  . Cancer ([address])   . Dyspnea on exertion    a. 11/2007 Echo: EF 60%.  Marland Kitchen GERD (gastroesophageal reflux disease)   . 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 Hypertension   . Joint pain    HIPS / LEGS  . Knee injury    RT  . Meningioma ([place])   . Morbid obesity ([place])   . Paroxysmal SVT (supraventricular tachycardia) ([place])    a. 11/2011 48h Holter: RSR, rare PVC's, occas PAC's  . Retinal tear of right eye 01/2016  . Ventral hernia     reports that she has never smoked. She has never used smokeless tobacco. She reports current alcohol use. She reports that she does not use drugs.   Current Outpatient Medications:  .  albuterol (PROAIR HFA) 108 (90 Base) MCG/ACT inhaler, TAKE 2 PUFFS BY MOUTH EVERY 6 HOURS AS NEEDED, Disp: 8.5 Inhaler, Rfl: 11 .  Baclofen 5 MG TABS, TAKE 1 TABLET EVERY 8 HOURS AS NEEDED NEUROPATHIC RELATED MYOFASCIAL PAIN, Disp: , Rfl:  .  Calcium Citrate-Vitamin D (CITRACAL + D PO), Take 2 tablets by mouth daily. , Disp: , Rfl:  .  Cyanocobalamin (VITAMIN B-12 SL), Place 1 tablet under the tongue  every morning., Disp: , Rfl:  .  diltiazem (CARDIZEM CD) 180 MG 24 hr capsule, TAKE 1 CAPSULE BY MOUTH EVERY DAY, Disp: 90 capsule, Rfl: 0 .  ELDERBERRY PO, Take 1 tablet by mouth daily., Disp: , Rfl:  .  fluticasone (FLONASE) 50 MCG/ACT nasal spray, SPRAY 2 SPRAYS INTO EACH NOSTRIL EVERY DAY, Disp: 48 g, Rfl: 1 .  hydrochlorothiazide (HYDRODIURIL) 25 MG tablet, TAKE 1 TABLET BY MOUTH EVERY DAY, Disp: 90 tablet, Rfl: 1 .  ketorolac (TORADOL) 10 MG tablet, , Disp: , Rfl:  .   methocarbamol (ROBAXIN) 500 MG tablet, TAKE 1 TABLET 4 TIMES A DAY AS NEEDED FOR SPASMS, Disp: , Rfl:  .  Omega-3 Fatty Acids (FISH OIL) 1200 MG CAPS, Take 1 capsule by mouth daily., Disp: , Rfl:  .  OXTELLAR XR 600 MG TB24, Take 2 tablets by mouth at bedtime., Disp: , Rfl:  .  pantoprazole (PROTONIX) 40 MG tablet, TAKE 1 TABLET BY MOUTH EVERY DAY, Disp: 90 tablet, Rfl: 0 .  traMADol (ULTRAM) 50 MG tablet, Take 50 mg by mouth every 8 (eight) hours as needed., Disp: , Rfl:  .  Turmeric 500 MG CAPS, Take 1 capsule by mouth daily., Disp: , Rfl:  .  vitamin C (ASCORBIC ACID) 500 MG tablet, Take 1,000 mg by mouth daily., Disp: , Rfl:   Current Facility-Administered Medications:  .  methylPREDNISolone acetate (DEPO-MEDROL) injection 80 mg, 80 mg, Intramuscular, Once, Baity, Regina W, NP   Observations/Objective: Height 5' 5.75" (1.67 m).  Physical Exam  Physical Exam Constitutional:      General: The patient is not in acute distress. Pulmonary:     Effort: Pulmonary effort is normal. No respiratory distress.  Neurological:     Mental Status: The patient is alert and oriented to person, place, and time.  Psychiatric:        Mood and Affect: Mood normal.        Behavior: Behavior normal.   Assessment and Plan Neck swelling o clear infectious symptoms. Harmon Pier with thyroid US given thyroid history.     I discussed the assessment and treatment plan with the patient. The patient was provided an opportunity to ask questions and all were answered. The patient agreed with the plan and demonstrated an understanding of the instructions.   The patient was advised to call back or seek an in-person evaluation if the symptoms worsen or if the condition fails to improve as anticipated.     [name], MD

## 2019-01-16 NOTE — Patient Instructions (Signed)
Call to set up lab visit for thyroid tests.

## 2019-01-19 ENCOUNTER — Other Ambulatory Visit (INDEPENDENT_AMBULATORY_CARE_PROVIDER_SITE_OTHER): Payer: BC Managed Care – PPO

## 2019-01-19 ENCOUNTER — Ambulatory Visit: Payer: BC Managed Care – PPO | Admitting: Family Medicine

## 2019-01-19 DIAGNOSIS — R221 Localized swelling, mass and lump, neck: Secondary | ICD-10-CM

## 2019-01-19 LAB — T4, FREE: Free T4: 1.09 ng/dL (ref 0.60–1.60)

## 2019-01-19 LAB — T3, FREE: T3, Free: 2.8 pg/mL (ref 2.3–4.2)

## 2019-01-19 LAB — TSH: TSH: 0.87 u[IU]/mL (ref 0.35–4.50)

## 2019-01-19 NOTE — Telephone Encounter (Signed)
See my chart message. Patient came to the office for lab work and was told to talk with triage. Patient stated that she has a very sore throat and difficulty swallowing. Patient stated that she thinks that it is related to her thyroid. Patient negative covid screening with the exception of sore throat. Offered patient a virtual visit with Dr. Glori Bickers today which she declined stating that she just had a virtual with Dr. Diona Browner Friday. Patient stated that she will just wait to hear back from Dr. Diona Browner and would like to schedule an in office visit with her. Advised patient that we are not suppose to bring anyone in the office with a sore throat and this message will be sent to Dr. Diona Browner.

## 2019-01-20 MED ORDER — PREDNISONE 10 MG PO TABS: ORAL_TABLET | ORAL | 0 refills | Status: DC

## 2019-01-20 MED ORDER — AMOXICILLIN 500 MG PO CAPS: 1000.0000 mg | ORAL_CAPSULE | Freq: Two times a day (BID) | ORAL | 0 refills | Status: DC

## 2019-01-27 ENCOUNTER — Other Ambulatory Visit: Payer: BC Managed Care – PPO

## 2019-02-04 ENCOUNTER — Ambulatory Visit
Admission: RE | Admit: 2019-02-04 | Discharge: 2019-02-04 | Disposition: A | Discharge status: Home / Self Care | Payer: BC Managed Care – PPO | Source: Ambulatory Visit | Attending: Family Medicine | Admitting: Family Medicine

## 2019-02-04 ENCOUNTER — Other Ambulatory Visit: Payer: Self-pay

## 2019-02-04 DIAGNOSIS — Z1231 Encounter for screening mammogram for malignant neoplasm of breast: Secondary | ICD-10-CM

## 2019-02-11 ENCOUNTER — Other Ambulatory Visit: Payer: Self-pay | Admitting: Family Medicine

## 2019-02-14 DIAGNOSIS — R221 Localized swelling, mass and lump, neck: Secondary | ICD-10-CM | POA: Insufficient documentation

## 2019-02-14 NOTE — Assessment & Plan Note (Signed)
o clear infectious symptoms. Harmon Pier with thyroid US given thyroid history.

## 2019-03-05 ENCOUNTER — Ambulatory Visit: Payer: BC Managed Care – PPO | Admitting: Family Medicine

## 2019-03-05 ENCOUNTER — Other Ambulatory Visit: Payer: Self-pay

## 2019-03-05 ENCOUNTER — Encounter: Payer: Self-pay | Admitting: Family Medicine

## 2019-03-05 VITALS — BP 110/80 | HR 72 | Temp 97.8°F | Ht 65.75 in | Wt 248.5 lb

## 2019-03-05 DIAGNOSIS — M778 Other enthesopathies, not elsewhere classified: Secondary | ICD-10-CM

## 2019-03-05 NOTE — Patient Instructions (Signed)
Motrin 600  mg recommended three times a day. (Over the counter Motrin, Advil, or Generic Ibuprofen 200 mg tablets. 3  tablets by mouth 3 times a day. This equals a prescription strength dose.)

## 2019-03-05 NOTE — Progress Notes (Signed)
Kimberly Sellen T. Dariush Mcnellis, MD Primary Care and [place] [facility] [place], [address] Phone: [phone]  FAX: [phone]  Kimberly Patton - 63 y.o. female  MRN [id number]  Date of Birth: 04-26-1956  Visit Date: 03/05/2019  PCP: Jinny Sanders, MD  Referred by: Jinny Sanders, MD  Chief Complaint  Patient presents with  . Wrist Pain   Subjective:   Kimberly Patton is a 63 y.o. very pleasant female patient with Body mass index is 40.41 kg/m. who presents with the following:  L  Wrist, start some wright swelling an dpain in the true wrist and does have a thumb spica. 3 days ago.  She is having discomfort primarily in the ulnar aspect of the left wrist.  This hurts more with ulnar deviation rather than radial deviation.  No pain in the distal radius or ulna.  She does not have any clear injury mechanism, and this happened only 3 days ago.  Tendonitis - ulnar   Past Medical History, Surgical History, Social History, Family History, Problem List, Medications, and Allergies have been reviewed and updated if relevant.  Patient Active Problem List   Diagnosis Date Noted  . Neck swelling 02/14/2019  . Ear pain, right 09/12/2018  . Chronic neuropathic pain 07/31/2018  . Thyroid nodule 04/15/2018  . Chest wall injury 10/04/2017  . Skin lesion of scalp 09/06/2017  . Tingling 09/06/2017  . Hypokalemia 06/08/2017  . Anemia 06/08/2017  . Osteoporosis 12/04/2016  . Osteoarthritis of knee 07/17/2016  . Meningioma ([place]) 01/18/2016  . Family history of aortic stenosis 01/20/2015  . Status post laparoscopic sleeve gastrectomy Dec 2014 06/08/2013  . Class 2 obesity due to excess calories with body mass index (BMI) of 38.0 to 38.9 in adult 12/08/2012  . HTN (hypertension) 10/11/2011  . Varicose veins of lower extremities with other complications [id number]  . FIBROIDS, UTERUS 04/03/2007  . Allergic rhinitis 04/03/2007  .  Asthma, mild intermittent 04/03/2007    Past Medical History:  Diagnosis Date  . ALLERGIC RHINITIS 04/03/2007  . ASTHMA, PERSISTENT, MODERATE 04/03/2007  . Cancer ([place])   . Dyspnea on exertion    a. 11/2007 Echo: EF 60%.  Marland Kitchen GERD (gastroesophageal reflux disease)   . 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 Hypertension   . Joint pain    HIPS / LEGS  . Knee injury    RT  . Meningioma ([place])   . Morbid obesity ([place])   . Paroxysmal SVT (supraventricular tachycardia) ([place])    a. 11/2011 48h Holter: RSR, rare PVC's, occas PAC's  . Retinal tear of right eye 01/2016  . Ventral hernia     Past Surgical History:  Procedure Laterality Date  . APPENDECTOMY  1996  . BREAST CYST EXCISION Right 1994   Benign  . CESAREAN SECTION     x2  . CHOLECYSTECTOMY  1995  . EYE SURGERY  01/2016   Repair retinal tear  . [place] / 1996   x2  . KNEE ARTHROSCOPY W/ MENISCAL REPAIR  07/25/13   right knee Dr. Mardelle Matte  . LAPAROSCOPIC GASTRIC SLEEVE RESECTION N/A 06/08/2013   Procedure: LAPAROSCOPIC GASTRIC SLEEVE RESECTION AND EXCISION OF SEBACEUS CYST FROM MID CHEST takedown of incarcerated ventral hernia and primary repair endoscopy;  Surgeon: [name], MD;  Location: [facility];  Service: General;  Laterality: N/A;  . moles  06/2013   removed 2 moles from under arm and  lowback  . OVARIAN CYST REMOVAL  1970  . PILONIDAL CYST EXCISION  1975  . RETINAL TEAR REPAIR CRYOTHERAPY Right 01/2016   [place]  . SKIN CANCER EXCISION    . TONSILLECTOMY    . UPPER GI ENDOSCOPY  06/08/2013   Procedure: UPPER GI ENDOSCOPY;  Surgeon: [name], MD;  Location: [facility];  Service: General;;  . US ECHOCARDIOGRAPHY  12/2011   WNL - EF 55-60%, mild MR, grade 1 diastolic dysnfiction (mild)  . VENTRAL HERNIA REPAIR  2000    Social History   Socioeconomic History  . Marital status: Married    Spouse name: Not on file  . Number of children: 2  . Years of education: Bachelors  . Highest education level: Not on file   Occupational History  . Occupation: Product manager: [employer]  . Financial resource strain: Not on file  . Food insecurity    Worry: Not on file    Inability: Not on file  . Transportation needs    Medical: Not on file    Non-medical: Not on file  Tobacco Use  . Smoking status: Never Smoker  . Smokeless tobacco: Never Used  . Tobacco comment: smoke at age [age]  Substance and Sexual Activity  . Alcohol use: Yes    Alcohol/week: 0.0 standard drinks    Comment: Occasionally  . Drug use: No  . Sexual activity: Not on file  Lifestyle  . Physical activity    Days per week: Not on file    Minutes per session: Not on file  . Stress: Not on file  Relationships  . Social Herbalist on phone: Not on file    Gets together: Not on file    Attends religious service: Not on file    Active member of club or organization: Not on file    Attends meetings of clubs or organizations: Not on file    Relationship status: Not on file  . Intimate partner violence    Fear of current or ex partner: Not on file    Emotionally abused: Not on file    Physically abused: Not on file    Forced sexual activity: Not on file  Other Topics Concern  . Not on file  Social History Narrative   Lives at home alone.   Right-handed.   Occasional caffeine use.    Family History  Problem Relation Age of Onset  . Breast cancer Mother   . Diabetes Mother   . Hypertension Mother   . Kidney failure Mother   . Diabetes Father   . Hypertension Father   . Diabetes Other   . Hypertension Other   . Stroke Other   . Hemophilia Other     Allergies  Allergen Reactions  . Wasp Venom Anaphylaxis  . Prednisone Other (See Comments)    Severe migraine / with taper dose  . Sodium Hypochlorite Rash    Liquid clorox bleach    Medication list reviewed and updated in full in [place].  GEN: No fevers, chills. Nontoxic. Primarily MSK c/o today. MSK: Detailed  in the HPI GI: tolerating PO intake without difficulty Neuro: No numbness, parasthesias, or tingling associated. Otherwise the pertinent positives of the ROS are noted above.   Objective:   BP 110/80   Pulse 72   Temp 97.8 F (36.6 C) (Oral)   Ht 5' 5.75" (1.67 m)   Wt 248 lb 8  oz (112.7 kg)   SpO2 96%   BMI 40.41 kg/m    GEN: WDWN, NAD, Non-toxic, Alert & Oriented x 3 HEENT: Atraumatic, Normocephalic.  Ears and Nose: No external deformity. EXTR: No clubbing/cyanosis/edema NEURO: Normal gait.  PSYCH: Normally interactive. Conversant. Not depressed or anxious appearing.  Calm demeanor.    At the left wrist, all phalanges and metacarpals are nontender.  Nontender along all of the distal radius and ulna.  Finkelstein's is negative.  The scaphoid is nontender.  She does have some pain with ulnar deviation and some pain along the sixth dorsal compartment.  Radiology:   Assessment and Plan:     ICD-10-CM   1. Left wrist tendonitis  M77.8    Put on a cock-up wrist splint, continue NSAIDs and ice, follow-up if needed in 3 weeks.  Follow-up: No follow-ups on file.  No orders of the defined types were placed in this encounter.  No orders of the defined types were placed in this encounter.   Signed,  Maud Deed. Corinthian Kemler, MD   Outpatient Encounter Medications as of 03/05/2019  Medication Sig  . albuterol (PROAIR HFA) 108 (90 Base) MCG/ACT inhaler TAKE 2 PUFFS BY MOUTH EVERY 6 HOURS AS NEEDED  . Calcium Citrate-Vitamin D (CITRACAL + D PO) Take 2 tablets by mouth daily.   . Cyanocobalamin (VITAMIN B-12 SL) Place 1 tablet under the tongue every morning.  . diltiazem (CARDIZEM CD) 180 MG 24 hr capsule TAKE 1 CAPSULE BY MOUTH EVERY DAY  . ELDERBERRY PO Take 1 tablet by mouth daily.  . fluticasone (FLONASE) 50 MCG/ACT nasal spray SPRAY 2 SPRAYS INTO EACH NOSTRIL EVERY DAY  . hydrochlorothiazide (HYDRODIURIL) 25 MG tablet TAKE 1 TABLET BY MOUTH EVERY DAY  . methocarbamol  (ROBAXIN) 500 MG tablet TAKE 1 TABLET 4 TIMES A DAY AS NEEDED FOR SPASMS  . Omega-3 Fatty Acids (FISH OIL) 1200 MG CAPS Take 1 capsule by mouth daily.  [name] XR 600 MG TB24 Take 2 tablets by mouth at bedtime.  . pantoprazole (PROTONIX) 40 MG tablet TAKE 1 TABLET BY MOUTH EVERY DAY  . traMADol (ULTRAM) 50 MG tablet Take 50 mg by mouth every 8 (eight) hours as needed.  . Turmeric 500 MG CAPS Take 1 capsule by mouth daily.  . vitamin C (ASCORBIC ACID) 500 MG tablet Take 1,000 mg by mouth daily.  . [DISCONTINUED] amoxicillin (AMOXIL) 500 MG capsule Take 2 capsules (1,000 mg total) by mouth 2 (two) times daily.  . [DISCONTINUED] Baclofen 5 MG TABS TAKE 1 TABLET EVERY 8 HOURS AS NEEDED NEUROPATHIC RELATED MYOFASCIAL PAIN  . [DISCONTINUED] ketorolac (TORADOL) 10 MG tablet   . [DISCONTINUED] predniSONE (DELTASONE) 10 MG tablet 3 tabs by mouth daily x 3 days, then 2 tabs by mouth daily x 2 days then 1 tab by mouth daily x 2 days   Facility-Administered Encounter Medications as of 03/05/2019  Medication  . methylPREDNISolone acetate (DEPO-MEDROL) injection 80 mg

## 2019-03-08 ENCOUNTER — Encounter: Payer: Self-pay | Admitting: Family Medicine

## 2019-03-10 ENCOUNTER — Ambulatory Visit: Payer: BC Managed Care – PPO

## 2019-03-17 ENCOUNTER — Ambulatory Visit: Payer: BC Managed Care – PPO

## 2019-03-19 ENCOUNTER — Other Ambulatory Visit: Payer: Self-pay | Admitting: Family Medicine

## 2019-04-02 ENCOUNTER — Encounter: Payer: Self-pay | Admitting: Primary Care

## 2019-04-02 ENCOUNTER — Ambulatory Visit: Payer: BC Managed Care – PPO | Admitting: Primary Care

## 2019-04-02 ENCOUNTER — Other Ambulatory Visit: Payer: Self-pay

## 2019-04-02 DIAGNOSIS — H9201 Otalgia, right ear: Secondary | ICD-10-CM

## 2019-04-02 MED ORDER — METHYLPREDNISOLONE ACETATE 40 MG/ML IJ SUSP
80.0000 mg | Freq: Once | INTRAMUSCULAR | Status: AC
  Administered 2019-04-02: 80 mg via INTRAMUSCULAR

## 2019-04-02 NOTE — Assessment & Plan Note (Signed)
Chronic and intermittent, recent symptoms x 1 week. Exam today without cerumen impaction, otitis media. Obvious effusion to left TM, dullness to right TM.  Continue Flonase and Xyzal, has been taking Xyzal for four days total. IM Depo Medrol 80 mg provided today for quick relief per patient.  Discussed potential need for ENT evaluation if symptoms persist. Consider Singulair if needed. She will update PCP.

## 2019-04-02 NOTE — Progress Notes (Signed)
Subjective:    Patient ID: Kimberly Patton, female    DOB: 03/23/1956, 63 y.o.   MRN: [id number]  HPI  Kimberly Patton is a 63 year old female with a history of hypertension, asthma, allergic rhinitis, otalgia who presents today with a chief complaint of otalgia.  Her pain is located to the right ear. She is also hearing a popping noise when eating at times. She has a long history of right otalgia, typically caused by "sinus issues". Recent symptoms began one week ago.  She is compliant to her Flonase, also started taking Xyzal recently but has had no improvement.   She denies sore throat, cough, fevers, nasal congestion, sinus pressure.  BP Readings from Last 3 Encounters:  03/05/19 110/80  11/23/18 109/70  09/24/18 102/70     Review of Systems  Constitutional: Negative for fever.  HENT: Positive for ear pain and postnasal drip. Negative for congestion, rhinorrhea, sinus pressure and sore throat.   Respiratory: Negative for cough and shortness of breath.   Allergic/Immunologic: Positive for environmental allergies.       Past Medical History:  Diagnosis Date  . ALLERGIC RHINITIS 04/03/2007  . ASTHMA, PERSISTENT, MODERATE 04/03/2007  . Cancer ([place])   . Dyspnea on exertion    a. 11/2007 Echo: EF 60%.  Marland Kitchen GERD (gastroesophageal reflux disease)   . 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 Hypertension   . Joint pain    HIPS / LEGS  . Knee injury    RT  . Meningioma ([place])   . Morbid obesity ([place])   . Paroxysmal SVT (supraventricular tachycardia) ([place])    a. 11/2011 48h Holter: RSR, rare PVC's, occas PAC's  . Retinal tear of right eye 01/2016  . Ventral hernia      Social History   Socioeconomic History  . Marital status: Married    Spouse name: Not on file  . Number of children: 2  . Years of education: Bachelors  . Highest education level: Not on file  Occupational History  . Occupation: Product manager: [employer]  . Financial resource strain: Not  on file  . Food insecurity    Worry: Not on file    Inability: Not on file  . Transportation needs    Medical: Not on file    Non-medical: Not on file  Tobacco Use  . Smoking status: Never Smoker  . Smokeless tobacco: Never Used  . Tobacco comment: smoke at age [age]  Substance and Sexual Activity  . Alcohol use: Yes    Alcohol/week: 0.0 standard drinks    Comment: Occasionally  . Drug use: No  . Sexual activity: Not on file  Lifestyle  . Physical activity    Days per week: Not on file    Minutes per session: Not on file  . Stress: Not on file  Relationships  . Social Herbalist on phone: Not on file    Gets together: Not on file    Attends religious service: Not on file    Active member of club or organization: Not on file    Attends meetings of clubs or organizations: Not on file    Relationship status: Not on file  . Intimate partner violence    Fear of current or ex partner: Not on file    Emotionally abused: Not on file    Physically abused: Not on file    Forced sexual activity: Not on file  Other  Topics Concern  . Not on file  Social History Narrative   Lives at home alone.   Right-handed.   Occasional caffeine use.    Past Surgical History:  Procedure Laterality Date  . APPENDECTOMY  1996  . BREAST CYST EXCISION Right 1994   Benign  . CESAREAN SECTION     x2  . CHOLECYSTECTOMY  1995  . EYE SURGERY  01/2016   Repair retinal tear  . [place] / 1996   x2  . KNEE ARTHROSCOPY W/ MENISCAL REPAIR  07/25/13   right knee Dr. Mardelle Matte  . LAPAROSCOPIC GASTRIC SLEEVE RESECTION N/A 06/08/2013   Procedure: LAPAROSCOPIC GASTRIC SLEEVE RESECTION AND EXCISION OF SEBACEUS CYST FROM MID CHEST takedown of incarcerated ventral hernia and primary repair endoscopy;  Surgeon: [name], MD;  Location: [facility];  Service: General;  Laterality: N/A;  . moles  06/2013   removed 2 moles from under arm and  lowback  . OVARIAN CYST REMOVAL  1970  .  PILONIDAL CYST EXCISION  1975  . RETINAL TEAR REPAIR CRYOTHERAPY Right 01/2016   [facility]  . SKIN CANCER EXCISION    . TONSILLECTOMY    . UPPER GI ENDOSCOPY  06/08/2013   Procedure: UPPER GI ENDOSCOPY;  Surgeon: [name], MD;  Location: [facility];  Service: General;;  . US ECHOCARDIOGRAPHY  12/2011   WNL - EF 55-60%, mild MR, grade 1 diastolic dysnfiction (mild)  . VENTRAL HERNIA REPAIR  2000    Family History  Problem Relation Age of Onset  . Breast cancer Mother   . Diabetes Mother   . Hypertension Mother   . Kidney failure Mother   . Diabetes Father   . Hypertension Father   . Diabetes Other   . Hypertension Other   . Stroke Other   . Hemophilia Other     Allergies  Allergen Reactions  . Wasp Venom Anaphylaxis  . Prednisone Other (See Comments)    Severe migraine / with taper dose  . Sodium Hypochlorite Rash    Liquid clorox bleach    Current Outpatient Medications on File Prior to Visit  Medication Sig Dispense Refill  . albuterol (PROAIR HFA) 108 (90 Base) MCG/ACT inhaler TAKE 2 PUFFS BY MOUTH EVERY 6 HOURS AS NEEDED 8.5 Inhaler 11  . Calcium Citrate-Vitamin D (CITRACAL + D PO) Take 2 tablets by mouth daily.     . Cyanocobalamin (VITAMIN B-12 SL) Place 1 tablet under the tongue every morning.    . diltiazem (CARDIZEM CD) 180 MG 24 hr capsule TAKE 1 CAPSULE BY MOUTH EVERY DAY 90 capsule 0  . ELDERBERRY PO Take 1 tablet by mouth daily.    . fluticasone (FLONASE) 50 MCG/ACT nasal spray SPRAY 2 SPRAYS INTO EACH NOSTRIL EVERY DAY 48 mL 0  . hydrochlorothiazide (HYDRODIURIL) 25 MG tablet TAKE 1 TABLET BY MOUTH EVERY DAY 90 tablet 1  . methocarbamol (ROBAXIN) 500 MG tablet TAKE 1 TABLET 4 TIMES A DAY AS NEEDED FOR SPASMS    . Omega-3 Fatty Acids (FISH OIL) 1200 MG CAPS Take 1 capsule by mouth daily.    [name] XR 600 MG TB24 Take 2 tablets by mouth at bedtime.    . pantoprazole (PROTONIX) 40 MG tablet TAKE 1 TABLET BY MOUTH EVERY DAY 90 tablet 0  . traMADol (ULTRAM)  50 MG tablet Take 50 mg by mouth every 8 (eight) hours as needed.    . Turmeric 500 MG CAPS Take 1 capsule by mouth  daily.    . vitamin C (ASCORBIC ACID) 500 MG tablet Take 1,000 mg by mouth daily.     Current Facility-Administered Medications on File Prior to Visit  Medication Dose Route Frequency Provider Last Rate Last Dose  . methylPREDNISolone acetate (DEPO-MEDROL) injection 80 mg  80 mg Intramuscular Once Baity, Regina W, NP        BP 110/82   Pulse 70   Temp 97.7 F (36.5 C) (Temporal)   Ht 5' 5.75" (1.67 m)   Wt 249 lb 4 oz (113.1 kg)   BMI 40.54 kg/m    Objective:   Physical Exam  Constitutional: She appears well-nourished.  HENT:  Right Ear: Tympanic membrane is bulging. Tympanic membrane is not erythematous.  Left Ear: Tympanic membrane is not erythematous and not bulging. A middle ear effusion is present.  Eyes: Conjunctivae are normal.  Neck: Neck supple.  Respiratory: Effort normal.           Assessment & Plan:

## 2019-04-02 NOTE — Addendum Note (Signed)
Addended by: Jacqualin Combes on: 04/02/2019 08:39 AM   Modules accepted: Orders

## 2019-04-02 NOTE — Patient Instructions (Signed)
Continue Flonase and Xyzal daily.  Please notify Dr. Diona Browner if your symptoms persist.   It was a pleasure meeting you!

## 2019-04-03 ENCOUNTER — Other Ambulatory Visit: Payer: Self-pay | Admitting: Family Medicine

## 2019-05-30 ENCOUNTER — Other Ambulatory Visit: Payer: Self-pay | Admitting: Family Medicine

## 2019-06-01 ENCOUNTER — Telehealth: Payer: Self-pay | Admitting: Family Medicine

## 2019-06-01 DIAGNOSIS — D509 Iron deficiency anemia, unspecified: Secondary | ICD-10-CM

## 2019-06-01 DIAGNOSIS — I10 Essential (primary) hypertension: Secondary | ICD-10-CM

## 2019-06-01 DIAGNOSIS — E041 Nontoxic single thyroid nodule: Secondary | ICD-10-CM

## 2019-06-01 NOTE — Telephone Encounter (Signed)
-----   Message from Ellamae Sia sent at 05/27/2019 10:07 AM EST ----- Regarding: lab orders for Tuesday, 12.22.20 Patient is scheduled for CPX labs, please order future labs, Thanks , Karna Christmas

## 2019-06-02 ENCOUNTER — Other Ambulatory Visit (INDEPENDENT_AMBULATORY_CARE_PROVIDER_SITE_OTHER): Payer: BC Managed Care – PPO

## 2019-06-02 ENCOUNTER — Other Ambulatory Visit: Payer: Self-pay

## 2019-06-02 DIAGNOSIS — I10 Essential (primary) hypertension: Secondary | ICD-10-CM

## 2019-06-02 DIAGNOSIS — D509 Iron deficiency anemia, unspecified: Secondary | ICD-10-CM

## 2019-06-02 LAB — COMPREHENSIVE METABOLIC PANEL WITH GFR
ALT: 11 U/L (ref 0–35)
AST: 13 U/L (ref 0–37)
Albumin: 3.9 g/dL (ref 3.5–5.2)
Alkaline Phosphatase: 90 U/L (ref 39–117)
BUN: 18 mg/dL (ref 6–23)
CO2: 31 meq/L (ref 19–32)
Calcium: 9.4 mg/dL (ref 8.4–10.5)
Chloride: 101 meq/L (ref 96–112)
Creatinine, Ser: 0.48 mg/dL (ref 0.40–1.20)
GFR: 130.38 mL/min (ref >60.00)
Glucose, Bld: 83 mg/dL (ref 70–99)
Potassium: 3.8 meq/L (ref 3.5–5.1)
Sodium: 139 meq/L (ref 135–145)
Total Bilirubin: 0.3 mg/dL (ref 0.2–1.2)
Total Protein: 6.8 g/dL (ref 6.0–8.3)

## 2019-06-02 LAB — LIPID PANEL
Cholesterol: 209 mg/dL — ABNORMAL HIGH (ref 0–200)
HDL: 63.1 mg/dL (ref >39.00)
LDL Cholesterol: 124 mg/dL — ABNORMAL HIGH (ref 0–99)
NonHDL: 146.2
Total CHOL/HDL Ratio: 3
Triglycerides: 111 mg/dL (ref 0.0–149.0)
VLDL: 22.2 mg/dL (ref 0.0–40.0)

## 2019-06-02 LAB — CBC WITH DIFFERENTIAL/PLATELET
Basophils Absolute: 0.1 10*3/uL (ref 0.0–0.1)
Basophils Relative: 1.2 % (ref 0.0–3.0)
Eosinophils Absolute: 0.2 10*3/uL (ref 0.0–0.7)
Eosinophils Relative: 3.9 % (ref 0.0–5.0)
HCT: 35 % — ABNORMAL LOW (ref 36.0–46.0)
Hemoglobin: 11.2 g/dL — ABNORMAL LOW (ref 12.0–15.0)
Lymphocytes Relative: 32.1 % (ref 12.0–46.0)
Lymphs Abs: 1.9 10*3/uL (ref 0.7–4.0)
MCHC: 32 g/dL (ref 30.0–36.0)
MCV: 78 fl (ref 78.0–100.0)
Monocytes Absolute: 0.4 10*3/uL (ref 0.1–1.0)
Monocytes Relative: 7.5 % (ref 3.0–12.0)
Neutro Abs: 3.3 10*3/uL (ref 1.4–7.7)
Neutrophils Relative %: 55.3 % (ref 43.0–77.0)
Platelets: 302 10*3/uL (ref 150.0–400.0)
RBC: 4.48 Mil/uL (ref 3.87–5.11)
RDW: 16.2 % — ABNORMAL HIGH (ref 11.5–15.5)
WBC: 5.9 10*3/uL (ref 4.0–10.5)

## 2019-06-02 LAB — IBC + FERRITIN
Ferritin: 3.9 ng/mL — ABNORMAL LOW (ref 10.0–291.0)
Iron: 36 ug/dL — ABNORMAL LOW (ref 42–145)
Saturation Ratios: 7.5 % — ABNORMAL LOW (ref 20.0–50.0)
Transferrin: 343 mg/dL (ref 212.0–360.0)

## 2019-06-03 NOTE — Progress Notes (Signed)
No critical labs need to be addressed urgently. We will discuss labs in detail at upcoming office visit.   

## 2019-06-09 ENCOUNTER — Encounter: Payer: Self-pay | Admitting: Family Medicine

## 2019-06-09 ENCOUNTER — Encounter: Payer: BC Managed Care – PPO | Admitting: Family Medicine

## 2019-06-09 ENCOUNTER — Ambulatory Visit (INDEPENDENT_AMBULATORY_CARE_PROVIDER_SITE_OTHER): Payer: BC Managed Care – PPO | Admitting: Family Medicine

## 2019-06-09 ENCOUNTER — Other Ambulatory Visit: Payer: Self-pay

## 2019-06-09 VITALS — BP 130/80 | HR 81 | Temp 97.3°F | Ht 65.5 in | Wt 252.0 lb

## 2019-06-09 DIAGNOSIS — D509 Iron deficiency anemia, unspecified: Secondary | ICD-10-CM | POA: Diagnosis not present

## 2019-06-09 DIAGNOSIS — J4520 Mild intermittent asthma, uncomplicated: Secondary | ICD-10-CM

## 2019-06-09 DIAGNOSIS — E7800 Pure hypercholesterolemia, unspecified: Secondary | ICD-10-CM | POA: Insufficient documentation

## 2019-06-09 DIAGNOSIS — Z0000 Encounter for general adult medical examination without abnormal findings: Secondary | ICD-10-CM

## 2019-06-09 DIAGNOSIS — I10 Essential (primary) hypertension: Secondary | ICD-10-CM

## 2019-06-09 DIAGNOSIS — Z1211 Encounter for screening for malignant neoplasm of colon: Secondary | ICD-10-CM

## 2019-06-09 DIAGNOSIS — M810 Age-related osteoporosis without current pathological fracture: Secondary | ICD-10-CM

## 2019-06-09 HISTORY — DX: Pure hypercholesterolemia, unspecified: E78.00

## 2019-06-09 MED ORDER — TRULICITY 0.75 MG/0.5ML ~~LOC~~ SOAJ: 0.7500 mg | SUBCUTANEOUS | 3 refills | Status: DC

## 2019-06-09 NOTE — Patient Instructions (Addendum)
Increase iron in diet. Return for lab only check in 3-6 months for iron panel and hemoglobin. May need iron infusions. Start Trulicity low dose weekly. Call if low sugar side effects.

## 2019-06-09 NOTE — Progress Notes (Signed)
Chief Complaint  Patient presents with  . Annual Exam    History of Present Illness: HPI  The patient is here for annual wellness exam and preventative care.     Chronic neck pain: Seeing  Dr. Ace Gins. Recommended neck fusion.  Etodolac caused SE.  Has rx for Sulindac  She  Is using a neck brace.    She also has low back pain.  She has tailbone pain.  She is unable o stand or sit for  Long periods of time.  She is unable to teach given the pain... she   She has been very inactive.Marland Kitchen given neck and back.  She has gained 40 lbs.  Morbid obesity: past gastric sleeve.. she has seen a nutritionist in past.  Hypertension:    At goal on diltiazem, HCTZ. BP Readings from Last 3 Encounters:  06/09/19 130/80  04/02/19 110/82  03/05/19 110/80  Using medication without problems or lightheadedness:  none Chest pain with exertion:none Edema:none Short of breath:none Average home BPs: Other issues:   Cholesterol:  6.0% risk of  10 year risk CVD  Lab Results  Component Value Date   CHOL 209 (H) 06/02/2019   HDL 63.10 06/02/2019   LDLCALC 124 (H) 06/02/2019   TRIG 111.0 06/02/2019   CHOLHDL 3 06/02/2019  Using medications without problems: Muscle aches:  Diet compliance: moderate Exercise: none Other complaints: Morbidly obese  Wt Readings from Last 3 Encounters:  06/09/19 252 lb (114.3 kg)  04/02/19 249 lb 4 oz (113.1 kg)  03/05/19 248 lb 8 oz (112.7 kg)     Asthma, mild intermittant: no flares. USe albuterol prn.   Anemia: does not tolerate iron pills given gastric sleeve.   This visit occurred during the SARS-CoV-2 public health emergency.  Safety protocols were in place, including screening questions prior to the visit, additional usage of staff PPE, and extensive cleaning of exam room while observing appropriate contact time as indicated for disinfecting solutions.   COVID 19 screen:  No recent travel or known exposure to COVID19 The patient denies respiratory  symptoms of COVID 19 at this time. The importance of social distancing was discussed today.     Review of Systems  Constitutional: Negative for chills and fever.  HENT: Negative for congestion and ear pain.   Eyes: Negative for pain and redness.  Respiratory: Negative for cough and shortness of breath.   Cardiovascular: Negative for chest pain, palpitations and leg swelling.  Gastrointestinal: Negative for abdominal pain, blood in stool, constipation, diarrhea, nausea and vomiting.  Genitourinary: Negative for dysuria.  Musculoskeletal: Negative for falls and myalgias.  Skin: Negative for rash.  Neurological: Negative for dizziness.  Psychiatric/Behavioral: Negative for depression. The patient is not nervous/anxious.       Past Medical History:  Diagnosis Date  . ALLERGIC RHINITIS 04/03/2007  . ASTHMA, PERSISTENT, MODERATE 04/03/2007  . Cancer ([place])   . Dyspnea on exertion    a. 11/2007 Echo: EF 60%.  Marland Kitchen GERD (gastroesophageal reflux disease)   . 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 Hypertension   . Joint pain    HIPS / LEGS  . Knee injury    RT  . Meningioma ([place])   . Morbid obesity ([place])   . Paroxysmal SVT (supraventricular tachycardia) ([place])    a. 11/2011 48h Holter: RSR, rare PVC's, occas PAC's  . Retinal tear of right eye 01/2016  . Ventral hernia     reports that she has never smoked. She has never used smokeless tobacco. She  reports current alcohol use. She reports that she does not use drugs.   Current Outpatient Medications:  .  albuterol (PROAIR HFA) 108 (90 Base) MCG/ACT inhaler, TAKE 2 PUFFS BY MOUTH EVERY 6 HOURS AS NEEDED, Disp: 8.5 Inhaler, Rfl: 11 .  Calcium Citrate-Vitamin D (CITRACAL + D PO), Take 2 tablets by mouth daily. , Disp: , Rfl:  .  Cyanocobalamin (VITAMIN B-12 SL), Place 1 tablet under the tongue every morning., Disp: , Rfl:  .  diltiazem (CARDIZEM CD) 180 MG 24 hr capsule, TAKE 1 CAPSULE BY MOUTH EVERY DAY, Disp: 90 capsule, Rfl: 0 .  ELDERBERRY PO, Take 1  tablet by mouth daily., Disp: , Rfl:  .  fluticasone (FLONASE) 50 MCG/ACT nasal spray, SPRAY 2 SPRAYS INTO EACH NOSTRIL EVERY DAY, Disp: 48 mL, Rfl: 0 .  hydrochlorothiazide (HYDRODIURIL) 25 MG tablet, TAKE 1 TABLET BY MOUTH EVERY DAY, Disp: 90 tablet, Rfl: 1 .  methocarbamol (ROBAXIN) 500 MG tablet, TAKE 1 TABLET 4 TIMES A DAY AS NEEDED FOR SPASMS, Disp: , Rfl:  .  Omega-3 Fatty Acids (FISH OIL) 1200 MG CAPS, Take 1 capsule by mouth daily., Disp: , Rfl:  .  OXTELLAR XR 600 MG TB24, Take 2 tablets by mouth at bedtime., Disp: , Rfl:  .  pantoprazole (PROTONIX) 40 MG tablet, TAKE 1 TABLET BY MOUTH EVERY DAY, Disp: 90 tablet, Rfl: 0 .  traMADol (ULTRAM) 50 MG tablet, Take 50 mg by mouth every 8 (eight) hours as needed., Disp: , Rfl:  .  Turmeric 500 MG CAPS, Take 1 capsule by mouth daily., Disp: , Rfl:  .  vitamin C (ASCORBIC ACID) 500 MG tablet, Take 1,000 mg by mouth daily., Disp: , Rfl:   Current Facility-Administered Medications:  .  methylPREDNISolone acetate (DEPO-MEDROL) injection 80 mg, 80 mg, Intramuscular, Once, Baity, Regina W, NP   Observations/Objective: Blood pressure 130/80, pulse 81, temperature (!) 97.3 F (36.3 C), temperature source Temporal, height 5' 5.5" (1.664 m), weight 252 lb (114.3 kg), SpO2 98 %.  Physical Exam Constitutional:      General: She is not in acute distress.    Appearance: Normal appearance. She is well-developed. She is obese. She is not ill-appearing or toxic-appearing.  HENT:     Head: Normocephalic.     Right Ear: Hearing, tympanic membrane, ear canal and external ear normal.     Left Ear: Hearing, tympanic membrane, ear canal and external ear normal.     Nose: Nose normal.  Eyes:     General: Lids are normal. Lids are everted, no foreign bodies appreciated.     Conjunctiva/sclera: Conjunctivae normal.     Pupils: Pupils are equal, round, and reactive to light.  Neck:     Thyroid: No thyroid mass or thyromegaly.     Vascular: No carotid bruit.      Trachea: Trachea normal.  Cardiovascular:     Rate and Rhythm: Normal rate and regular rhythm.     Heart sounds: Normal heart sounds, S1 normal and S2 normal. No murmur. No gallop.   Pulmonary:     Effort: Pulmonary effort is normal. No respiratory distress.     Breath sounds: Normal breath sounds. No wheezing, rhonchi or rales.  Abdominal:     General: Bowel sounds are normal. There is no distension or abdominal bruit.     Palpations: Abdomen is soft. There is no fluid wave or mass.     Tenderness: There is no abdominal tenderness. There is no guarding or rebound.  Hernia: No hernia is present.  Musculoskeletal:     Cervical back: Normal range of motion and neck supple.  Lymphadenopathy:     Cervical: No cervical adenopathy.  Skin:    General: Skin is warm and dry.     Findings: No rash.  Neurological:     Mental Status: She is alert.     Cranial Nerves: No cranial nerve deficit.     Sensory: No sensory deficit.  Psychiatric:        Mood and Affect: Mood is not anxious or depressed.        Speech: Speech normal.        Behavior: Behavior normal. Behavior is cooperative.        Judgment: Judgment normal.      Assessment and Plan   The patient's preventative maintenance and recommended screening tests for an annual wellness exam were reviewed in full today. Brought up to date unless services declined.  Counselled on the importance of diet, exercise, and its role in overall health and mortality. The patient's FH and SH was reviewed, including their home life, tobacco status, and drug and alcohol status.   Vaccines:uptodate flu and td. Due for shingles vaccine..refused Pap/DVE:2016 neg pap, neg HPV.Marland Kitchen Repeat in 2021, no family history of ovarian/ovarian cancer.Marland Kitchen No DVE. Mammo:01/2019 Bone Density: osteoporosis 2018 contraindications for bisphosphonate, considering other options .. plans to repeat in spring Colon:ifob neg 2019 Smoking Status:nonsmoker ETOH/ drug  [id number] wine/no drugs Hep C:done HIV screen:done  Asthma, mild intermittent  Stable control , using albuterol prn.  HTN (hypertension) Well controlled. Continue current medication.   Iron deficiency anemia  Mild anemia from iron def form past gastric sleeve..  Increase iron in diet. No blood loss known.  Increase iron in diet. Return for lab only check in 3-6 months for iron panel and hemoglobin. May need iron infusions.  Hypercholesteremia 6.0% risk of  10 year risk CVD   No indication for statin at this time.  Morbid obesity (HCC)  S/P gastric sleeve.. now stomach stretched out with weight regain.  Discussed options.  Pt refused nutritionist as she has seen in past.  Limited ability to exercise with cervical pain, low back pain  and bilateral OA.   Will start trial of trulicity for weight loss. Discuss regular healthy high protein meals. Close follow up and precautions reguarding low glucose given.     [name], MD

## 2019-06-09 NOTE — Assessment & Plan Note (Signed)
Stable control , using albuterol prn.

## 2019-06-09 NOTE — Assessment & Plan Note (Signed)
6.0% risk of  10 year risk CVD   No indication for statin at this time.

## 2019-06-09 NOTE — Assessment & Plan Note (Addendum)
Mild anemia from iron def form past gastric sleeve..  Increase iron in diet. No blood loss known.  Increase iron in diet. Return for lab only check in 3-6 months for iron panel and hemoglobin. May need iron infusions.

## 2019-06-09 NOTE — Assessment & Plan Note (Signed)
S/P gastric sleeve.. now stomach stretched out with weight regain.  Discussed options.  Pt refused nutritionist as she has seen in past.  Limited ability to exercise with cervical pain, low back pain  and bilateral OA.   Will start trial of trulicity for weight loss. Discuss regular healthy high protein meals. Close follow up and precautions reguarding low glucose given.

## 2019-06-09 NOTE — Assessment & Plan Note (Signed)
Well controlled. Continue current medication.  

## 2019-06-10 ENCOUNTER — Other Ambulatory Visit (INDEPENDENT_AMBULATORY_CARE_PROVIDER_SITE_OTHER): Payer: BC Managed Care – PPO

## 2019-06-10 DIAGNOSIS — Z1211 Encounter for screening for malignant neoplasm of colon: Secondary | ICD-10-CM

## 2019-06-10 LAB — FECAL OCCULT BLOOD, IMMUNOCHEMICAL: Fecal Occult Bld: NEGATIVE

## 2019-06-15 ENCOUNTER — Other Ambulatory Visit: Payer: Self-pay | Admitting: Family Medicine

## 2019-06-23 ENCOUNTER — Encounter: Payer: Self-pay | Admitting: Family Medicine

## 2019-06-23 ENCOUNTER — Ambulatory Visit (INDEPENDENT_AMBULATORY_CARE_PROVIDER_SITE_OTHER): Payer: BC Managed Care – PPO | Admitting: Family Medicine

## 2019-06-23 VITALS — Ht 65.5 in | Wt 250.1 lb

## 2019-06-23 DIAGNOSIS — Z9884 Bariatric surgery status: Secondary | ICD-10-CM | POA: Diagnosis not present

## 2019-06-23 NOTE — Progress Notes (Signed)
VIRTUAL VISIT Due to national recommendations of social distancing due to [place] 19, a virtual visit is felt to be most appropriate for this patient at this time.   I connected with the patient on 06/23/19 at 11:00 AM EST by virtual telehealth platform and verified that I am speaking with the correct person using two identifiers.   I discussed the limitations, risks, security and privacy concerns of performing an evaluation and management service by  virtual telehealth platform and the availability of in person appointments. I also discussed with the patient that there may be a patient responsible charge related to this service. The patient expressed understanding and agreed to proceed.  Patient location: Home Provider Location: [facility] Participants: [name] and Myles Gip   Chief Complaint  Patient presents with  . Follow-up    Weight Management    History of Present Illness: 64 year old morbidly obese female with gastric sleeve remotely presents for follow up weight management.   At last OV she was started on lose dose Trulicity weekly.   She reports she has had 2 dose.  She has lost 2 lbs.  She denies SE.  She does not feel more full, no change in satiety. She has cut down portion sizes, she is doing some intermittent fasting. Not eating after 6 or before 10 AM.  She is eating higher iron foods, heatlhier food.  She has been walking 2-3 times.   Wt Readings from Last 3 Encounters:  06/23/19 250 lb 1 oz (113.4 kg)  06/09/19 252 lb (114.3 kg)  04/02/19 249 lb 4 oz (113.1 kg)     COVID 19 screen No recent travel or known exposure to [place] The patient denies respiratory symptoms of COVID 19 at this time.  The importance of social distancing was discussed today.   Review of Systems  Constitutional: Negative for chills and fever.  HENT: Negative for congestion and ear pain.   Eyes: Negative for pain and redness.  Respiratory: Negative for cough and  shortness of breath.   Cardiovascular: Negative for chest pain, palpitations and leg swelling.  Gastrointestinal: Negative for abdominal pain, blood in stool, constipation, diarrhea, nausea and vomiting.  Genitourinary: Negative for dysuria.  Musculoskeletal: Positive for neck pain. Negative for falls and myalgias.       Tailbone pain.. will make in office visit to examine if not improving.  chronic neck pain  Skin: Negative for rash.  Neurological: Negative for dizziness.  Psychiatric/Behavioral: Negative for depression. The patient is not nervous/anxious.       Past Medical History:  Diagnosis Date  . ALLERGIC RHINITIS 04/03/2007  . ASTHMA, PERSISTENT, MODERATE 04/03/2007  . Cancer ([place])   . Dyspnea on exertion    a. 11/2007 Echo: EF 60%.  Marland Kitchen GERD (gastroesophageal reflux disease)   . 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 Hypertension   . Joint pain    HIPS / LEGS  . Knee injury    RT  . Meningioma ([place])   . Morbid obesity ([place])   . Paroxysmal SVT (supraventricular tachycardia) ([place])    a. 11/2011 48h Holter: RSR, rare PVC's, occas PAC's  . Retinal tear of right eye 01/2016  . Ventral hernia     reports that she has never smoked. She has never used smokeless tobacco. She reports current alcohol use. She reports that she does not use drugs.   Current Outpatient Medications:  .  albuterol (PROAIR HFA) 108 (90 Base) MCG/ACT inhaler, TAKE 2 PUFFS BY  MOUTH EVERY 6 HOURS AS NEEDED, Disp: 8.5 Inhaler, Rfl: 11 .  Calcium Citrate-Vitamin D (CITRACAL + D PO), Take 2 tablets by mouth daily. , Disp: , Rfl:  .  Cyanocobalamin (VITAMIN B-12 SL), Place 1 tablet under the tongue every morning., Disp: , Rfl:  .  diltiazem (CARDIZEM CD) 180 MG 24 hr capsule, TAKE 1 CAPSULE BY MOUTH EVERY DAY, Disp: 90 capsule, Rfl: 0 .  Dulaglutide (TRULICITY) A999333 0000000 SOPN, Inject 0.75 mg into the skin once a week., Disp: 3 mL, Rfl: 3 .  ELDERBERRY PO, Take 1 tablet by mouth daily., Disp: , Rfl:  .  fluticasone  (FLONASE) 50 MCG/ACT nasal spray, SPRAY 2 SPRAYS INTO EACH NOSTRIL EVERY DAY, Disp: 48 mL, Rfl: 0 .  hydrochlorothiazide (HYDRODIURIL) 25 MG tablet, TAKE 1 TABLET BY MOUTH EVERY DAY, Disp: 90 tablet, Rfl: 1 .  methocarbamol (ROBAXIN) 500 MG tablet, TAKE 1 TABLET 4 TIMES A DAY AS NEEDED FOR SPASMS, Disp: , Rfl:  .  Omega-3 Fatty Acids (FISH OIL) 1200 MG CAPS, Take 1 capsule by mouth daily., Disp: , Rfl:  .  OXTELLAR XR 600 MG TB24, Take 2 tablets by mouth at bedtime., Disp: , Rfl:  .  pantoprazole (PROTONIX) 40 MG tablet, TAKE 1 TABLET BY MOUTH EVERY DAY, Disp: 90 tablet, Rfl: 0 .  sulindac (CLINORIL) 200 MG tablet, Take 200 mg by mouth 2 (two) times daily as needed., Disp: , Rfl:  .  traMADol (ULTRAM) 50 MG tablet, Take 50 mg by mouth every 8 (eight) hours as needed., Disp: , Rfl:  .  Turmeric 500 MG CAPS, Take 1 capsule by mouth daily., Disp: , Rfl:  .  vitamin C (ASCORBIC ACID) 500 MG tablet, Take 1,000 mg by mouth daily., Disp: , Rfl:   Current Facility-Administered Medications:  .  methylPREDNISolone acetate (DEPO-MEDROL) injection 80 mg, 80 mg, Intramuscular, Once, Baity, Regina W, NP   Observations/Objective: Height 5' 5.5" (1.664 m), weight 250 lb 1 oz (113.4 kg).  Physical Exam  Physical Exam Constitutional:      General: The patient is not in acute distress. Pulmonary:     Effort: Pulmonary effort is normal. No respiratory distress.  Neurological:     Mental Status: The patient is alert and oriented to person, place, and time.  Psychiatric:        Mood and Affect: Mood normal.        Behavior: Behavior normal.   Assessment and Plan Morbid obesity ([place]) Encouraged exercise, weight loss, healthy eating habits. Tolerating trulicty.. no SE. Conitnue current dose but if minimal continued weight loss we can try to increase dose some in 4 weeks.   Status post laparoscopic sleeve gastrectomy Dec 2014 Unfortunately she has had  Likely self-reversal of surgery overtime     I  discussed the assessment and treatment plan with the patient. The patient was provided an opportunity to ask questions and all were answered. The patient agreed with the plan and demonstrated an understanding of the instructions.   The patient was advised to call back or seek an in-person evaluation if the symptoms worsen or if the condition fails to improve as anticipated.     [name], MD

## 2019-06-23 NOTE — Assessment & Plan Note (Signed)
Encouraged exercise, weight loss, healthy eating habits. Tolerating trulicty.. no SE. Conitnue current dose but if minimal continued weight loss we can try to increase dose some in 4 weeks.

## 2019-06-23 NOTE — Assessment & Plan Note (Signed)
Unfortunately she has had  Likely self-reversal of surgery overtime

## 2019-06-28 ENCOUNTER — Other Ambulatory Visit: Payer: Self-pay | Admitting: Family Medicine

## 2019-07-16 ENCOUNTER — Ambulatory Visit (INDEPENDENT_AMBULATORY_CARE_PROVIDER_SITE_OTHER)
Admission: RE | Admit: 2019-07-16 | Discharge: 2019-07-16 | Disposition: A | Discharge status: Home / Self Care | Payer: BC Managed Care – PPO | Source: Ambulatory Visit | Attending: Family Medicine | Admitting: Family Medicine

## 2019-07-16 ENCOUNTER — Ambulatory Visit: Payer: BC Managed Care – PPO | Admitting: Family Medicine

## 2019-07-16 ENCOUNTER — Other Ambulatory Visit: Payer: Self-pay

## 2019-07-16 ENCOUNTER — Encounter: Payer: Self-pay | Admitting: Family Medicine

## 2019-07-16 DIAGNOSIS — M533 Sacrococcygeal disorders, not elsewhere classified: Secondary | ICD-10-CM

## 2019-07-16 DIAGNOSIS — D509 Iron deficiency anemia, unspecified: Secondary | ICD-10-CM | POA: Diagnosis not present

## 2019-07-16 DIAGNOSIS — R200 Anesthesia of skin: Secondary | ICD-10-CM

## 2019-07-16 DIAGNOSIS — R202 Paresthesia of skin: Secondary | ICD-10-CM | POA: Diagnosis not present

## 2019-07-16 LAB — CBC WITH DIFFERENTIAL/PLATELET
Basophils Absolute: 0.1 10*3/uL (ref 0.0–0.1)
Basophils Relative: 1.3 % (ref 0.0–3.0)
Eosinophils Absolute: 0.1 10*3/uL (ref 0.0–0.7)
Eosinophils Relative: 1.9 % (ref 0.0–5.0)
HCT: 37 % (ref 36.0–46.0)
Hemoglobin: 11.9 g/dL — ABNORMAL LOW (ref 12.0–15.0)
Lymphocytes Relative: 28.9 % (ref 12.0–46.0)
Lymphs Abs: 1.8 10*3/uL (ref 0.7–4.0)
MCHC: 32.3 g/dL (ref 30.0–36.0)
MCV: 77.3 fl — ABNORMAL LOW (ref 78.0–100.0)
Monocytes Absolute: 0.4 10*3/uL (ref 0.1–1.0)
Monocytes Relative: 6.1 % (ref 3.0–12.0)
Neutro Abs: 3.9 10*3/uL (ref 1.4–7.7)
Neutrophils Relative %: 61.8 % (ref 43.0–77.0)
Platelets: 349 10*3/uL (ref 150.0–400.0)
RBC: 4.79 Mil/uL (ref 3.87–5.11)
RDW: 16.3 % — ABNORMAL HIGH (ref 11.5–15.5)
WBC: 6.3 10*3/uL (ref 4.0–10.5)

## 2019-07-16 LAB — IBC + FERRITIN
Ferritin: 4.2 ng/mL — ABNORMAL LOW (ref 10.0–291.0)
Iron: 60 ug/dL (ref 42–145)
Saturation Ratios: 11.6 % — ABNORMAL LOW (ref 20.0–50.0)
Transferrin: 371 mg/dL — ABNORMAL HIGH (ref 212.0–360.0)

## 2019-07-16 LAB — BASIC METABOLIC PANEL
BUN: 18 mg/dL (ref 6–23)
CO2: 32 mEq/L (ref 19–32)
Calcium: 9.9 mg/dL (ref 8.4–10.5)
Chloride: 98 mEq/L (ref 96–112)
Creatinine, Ser: 0.51 mg/dL (ref 0.40–1.20)
GFR: 121.52 mL/min (ref >60.00)
Glucose, Bld: 81 mg/dL (ref 70–99)
Potassium: 3.6 mEq/L (ref 3.5–5.1)
Sodium: 139 mEq/L (ref 135–145)

## 2019-07-16 LAB — VITAMIN B12: Vitamin B-12: >1500 pg/mL — ABNORMAL HIGH (ref 211–911)

## 2019-07-16 LAB — HEMOGLOBIN A1C: Hgb A1c MFr Bld: 5.3 % (ref 4.6–6.5)

## 2019-07-16 NOTE — Patient Instructions (Addendum)
Please stop at the lab to have labs drawn. We will likely increase trulicity to 1.5 mg weekly, but will await results of lab evaluation. We will call with x-ray results.  Start home physical therapy.

## 2019-07-16 NOTE — Progress Notes (Signed)
Chief Complaint  Patient presents with  . Tailbone Pain  . feet problem    feels like pins & needles in both her feet x2 weeks  . Medication Problem    does feel trulicity is working     History of Present Illness: HPI  64 year old female presents for  multiple issues  1. tailbone pain: continued pain for several months.  Feels left but  Left cheek is firm, sore area.  No discharge. No odor, no fever. HX pilonidal cyst years ago.  Sulindac she is on for upper back does not help.  no recent falls  2. Bilateral issue foot issue,  She has noted new onset  Cold feet, feels like pins a n needle. Bothers her most at night.  HAs chronic low back pain, no worse , but daily and improved with leaning over.  3.Trulicity not helping with weight loss  . She has been on Trulicity x 2 months She is doing fasting, eats 9-4 PM.  Portion size. 3 protein shakes a day along with meals. S/P gastric sleeve.  Limited activity given pandemic Wt Readings from Last 3 Encounters:  07/16/19 251 lb 12.8 oz (114.2 kg)  06/23/19 250 lb 1 oz (113.4 kg)  06/09/19 252 lb (114.3 kg)    TSH, free t3 and t4 nml  CBS hg: 11.2 Iron was low, but not taking given concern of constipation.  Taking vit B12, not recently checked.   This visit occurred during the SARS-CoV-2 public health emergency.  Safety protocols were in place, including screening questions prior to the visit, additional usage of staff PPE, and extensive cleaning of exam room while observing appropriate contact time as indicated for disinfecting solutions.   COVID 19 screen:  No recent travel or known exposure to COVID19 The patient denies respiratory symptoms of COVID 19 at this time. The importance of social distancing was discussed today.     Review of Systems  Constitutional: Negative for chills and fever.  HENT: Negative for congestion and ear pain.   Eyes: Negative for pain and redness.  Respiratory: Negative for cough and  shortness of breath.   Cardiovascular: Negative for chest pain, palpitations and leg swelling.  Gastrointestinal: Negative for abdominal pain, blood in stool, constipation, diarrhea, nausea and vomiting.  Genitourinary: Negative for dysuria.  Musculoskeletal: Negative for back pain, falls and myalgias.       Chronic head and neck pain  Skin: Negative for rash.  Neurological: Positive for tingling. Negative for dizziness.  Psychiatric/Behavioral: Negative for depression. The patient is not nervous/anxious.       Past Medical History:  Diagnosis Date  . ALLERGIC RHINITIS 04/03/2007  . ASTHMA, PERSISTENT, MODERATE 04/03/2007  . Cancer ([place])   . Dyspnea on exertion    a. 11/2007 Echo: EF 60%.  Marland Kitchen GERD (gastroesophageal reflux disease)   . 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 Hypertension   . Joint pain    HIPS / LEGS  . Knee injury    RT  . Meningioma ([place])   . Morbid obesity ([place])   . Paroxysmal SVT (supraventricular tachycardia) ([place])    a. 11/2011 48h Holter: RSR, rare PVC's, occas PAC's  . Retinal tear of right eye 01/2016  . Ventral hernia     reports that she has never smoked. She has never used smokeless tobacco. She reports current alcohol use. She reports that she does not use drugs.   Current Outpatient Medications:  .  albuterol (PROAIR HFA) 108 (90 Base)  MCG/ACT inhaler, TAKE 2 PUFFS BY MOUTH EVERY 6 HOURS AS NEEDED, Disp: 8.5 Inhaler, Rfl: 11 .  Calcium Citrate-Vitamin D (CITRACAL + D PO), Take 2 tablets by mouth daily. , Disp: , Rfl:  .  Cyanocobalamin (VITAMIN B-12 SL), Place 1 tablet under the tongue every morning., Disp: , Rfl:  .  diltiazem (CARDIZEM CD) 180 MG 24 hr capsule, TAKE 1 CAPSULE BY MOUTH EVERY DAY, Disp: 90 capsule, Rfl: 3 .  Dulaglutide (TRULICITY) A999333 0000000 SOPN, Inject 0.75 mg into the skin once a week., Disp: 3 mL, Rfl: 3 .  ELDERBERRY PO, Take 1 tablet by mouth daily., Disp: , Rfl:  .  fluticasone (FLONASE) 50 MCG/ACT nasal spray, SPRAY 2 SPRAYS INTO EACH  NOSTRIL EVERY DAY, Disp: 48 mL, Rfl: 0 .  hydrochlorothiazide (HYDRODIURIL) 25 MG tablet, TAKE 1 TABLET BY MOUTH EVERY DAY, Disp: 90 tablet, Rfl: 1 .  methocarbamol (ROBAXIN) 500 MG tablet, TAKE 1 TABLET 4 TIMES A DAY AS NEEDED FOR SPASMS, Disp: , Rfl:  .  Omega-3 Fatty Acids (FISH OIL) 1200 MG CAPS, Take 1 capsule by mouth daily., Disp: , Rfl:  .  OXTELLAR XR 600 MG TB24, Take 2 tablets by mouth at bedtime., Disp: , Rfl:  .  pantoprazole (PROTONIX) 40 MG tablet, TAKE 1 TABLET BY MOUTH EVERY DAY, Disp: 90 tablet, Rfl: 0 .  sulindac (CLINORIL) 200 MG tablet, Take 200 mg by mouth 2 (two) times daily as needed., Disp: , Rfl:  .  traMADol (ULTRAM) 50 MG tablet, Take 50 mg by mouth every 8 (eight) hours as needed., Disp: , Rfl:  .  Turmeric 500 MG CAPS, Take 1 capsule by mouth daily., Disp: , Rfl:  .  vitamin C (ASCORBIC ACID) 500 MG tablet, Take 1,000 mg by mouth daily., Disp: , Rfl:   Current Facility-Administered Medications:  .  methylPREDNISolone acetate (DEPO-MEDROL) injection 80 mg, 80 mg, Intramuscular, Once, Baity, Regina W, NP   Observations/Objective: Blood pressure 102/74, pulse 70, temperature 98 F (36.7 C), height 5' 5.5" (1.664 m), weight 251 lb 12.8 oz (114.2 kg), SpO2 95 %.  Physical Exam Constitutional:      General: She is not in acute distress.    Appearance: Normal appearance. She is well-developed. She is obese. She is not ill-appearing or toxic-appearing.  HENT:     Head: Normocephalic.     Right Ear: Hearing, tympanic membrane, ear canal and external ear normal. Tympanic membrane is not erythematous, retracted or bulging.     Left Ear: Hearing, tympanic membrane, ear canal and external ear normal. Tympanic membrane is not erythematous, retracted or bulging.     Nose: No mucosal edema or rhinorrhea.     Right Sinus: No maxillary sinus tenderness or frontal sinus tenderness.     Left Sinus: No maxillary sinus tenderness or frontal sinus tenderness.     Mouth/Throat:      Pharynx: Uvula midline.  Eyes:     General: Lids are normal. Lids are everted, no foreign bodies appreciated.     Conjunctiva/sclera: Conjunctivae normal.     Pupils: Pupils are equal, round, and reactive to light.  Neck:     Thyroid: No thyroid mass or thyromegaly.     Vascular: No carotid bruit.     Trachea: Trachea normal.  Cardiovascular:     Rate and Rhythm: Normal rate and regular rhythm.     Pulses: Normal pulses.     Heart sounds: Normal heart sounds, S1 normal and S2 normal. No murmur.  No friction rub. No gallop.   Pulmonary:     Effort: Pulmonary effort is normal. No tachypnea or respiratory distress.     Breath sounds: Normal breath sounds. No decreased breath sounds, wheezing, rhonchi or rales.  Abdominal:     General: Bowel sounds are normal.     Palpations: Abdomen is soft.     Tenderness: There is no abdominal tenderness.  Musculoskeletal:     Cervical back: Normal range of motion and neck supple.     Comments: ttp lateral to tailbone on right  Skin:    General: Skin is warm and dry.     Findings: No rash.  Neurological:     Mental Status: She is alert.  Psychiatric:        Mood and Affect: Mood is not anxious or depressed.        Speech: Speech normal.        Behavior: Behavior normal. Behavior is cooperative.        Thought Content: Thought content normal.        Judgment: Judgment normal.      Assessment and Plan Coccygeal pain, acute No skin lesion or cyst.  Eval with X-ray.  Possible sacroiliitis.  Numbness and tingling of both feet Eval with labs.  Morbid obesity ([place]) We will likely increase trulicity to 1.5 mg weekly, but will await results of lab evaluation.  Encouraged exercise, weight loss, healthy eating habits.         [name], MD

## 2019-07-17 ENCOUNTER — Other Ambulatory Visit: Payer: Self-pay | Admitting: Family Medicine

## 2019-07-17 MED ORDER — TRULICITY 1.5 MG/0.5ML ~~LOC~~ SOAJ: 1.5000 mg | SUBCUTANEOUS | 11 refills | Status: DC

## 2019-07-30 ENCOUNTER — Other Ambulatory Visit: Payer: BC Managed Care – PPO

## 2019-08-09 NOTE — Telephone Encounter (Signed)
Will send to Butch Penny to follow up Monday

## 2019-08-10 NOTE — Telephone Encounter (Signed)
I spoke with pt and pt said had symptoms for 1 or more wks. Pt has lower burning abd pain and  rt upper abd pain and pressure that comes and goes. Pt feels bloated in upper abd with nausea but no vomiting. pts pain level now is 4. Today pt has basically been resting. If pt just sitting or laying not a lot of pain but if up moving around or eating spicy food then pt has more intensified pain. Pt has been drinking a lot of water which has basically gotten rid of dry mouth. Pt wonders if could be side effect of Trulicity. Pt will stop Trulicity as instructed by Dr Diona Browner. Pt is in no distress and not in pain now. Pt has no covid symptoms except abd pain, no travel and no known exposure to + covid. Pt scheduled in office appt with Dr Diona Browner on 08/11/19 at 10:20 as instructed. UC & ED precautions given and pt voiced understanding FYI to Dr Diona Browner.

## 2019-08-10 NOTE — Telephone Encounter (Signed)
Sent pt Mychart message to make appt if pain continuing.. please call  If she does not view Mychart by day end.

## 2019-08-10 NOTE — Telephone Encounter (Signed)
I will send note to have her triaged by [facility]... If Kimberly Patton is not available please send her to the appropriate person for tirage.. Please add her in tommorow at 10:20 blocked appt.  When speaking with her.. if pain is severe she needs to  Go to Urgent care or ER now.

## 2019-08-11 ENCOUNTER — Ambulatory Visit
Admission: RE | Admit: 2019-08-11 | Discharge: 2019-08-11 | Disposition: A | Discharge status: Home / Self Care | Payer: BC Managed Care – PPO | Source: Ambulatory Visit | Attending: Family Medicine | Admitting: Family Medicine

## 2019-08-11 ENCOUNTER — Other Ambulatory Visit: Payer: Self-pay

## 2019-08-11 ENCOUNTER — Ambulatory Visit: Payer: BC Managed Care – PPO | Admitting: Family Medicine

## 2019-08-11 VITALS — BP 104/78 | HR 69 | Temp 97.2°F | Resp 12 | Ht 65.5 in | Wt 244.5 lb

## 2019-08-11 DIAGNOSIS — M810 Age-related osteoporosis without current pathological fracture: Secondary | ICD-10-CM

## 2019-08-11 DIAGNOSIS — R1013 Epigastric pain: Secondary | ICD-10-CM | POA: Diagnosis not present

## 2019-08-11 LAB — CBC WITH DIFFERENTIAL/PLATELET
Basophils Absolute: 0 10*3/uL (ref 0.0–0.1)
Basophils Relative: 0.6 % (ref 0.0–3.0)
Eosinophils Absolute: 0.1 10*3/uL (ref 0.0–0.7)
Eosinophils Relative: 2.4 % (ref 0.0–5.0)
HCT: 36.8 % (ref 36.0–46.0)
Hemoglobin: 12 g/dL (ref 12.0–15.0)
Lymphocytes Relative: 31.8 % (ref 12.0–46.0)
Lymphs Abs: 2 10*3/uL (ref 0.7–4.0)
MCHC: 32.7 g/dL (ref 30.0–36.0)
MCV: 78.3 fl (ref 78.0–100.0)
Monocytes Absolute: 0.4 10*3/uL (ref 0.1–1.0)
Monocytes Relative: 7.3 % (ref 3.0–12.0)
Neutro Abs: 3.6 10*3/uL (ref 1.4–7.7)
Neutrophils Relative %: 57.9 % (ref 43.0–77.0)
Platelets: 320 10*3/uL (ref 150.0–400.0)
RBC: 4.7 Mil/uL (ref 3.87–5.11)
RDW: 16.4 % — ABNORMAL HIGH (ref 11.5–15.5)
WBC: 6.2 10*3/uL (ref 4.0–10.5)

## 2019-08-11 LAB — COMPREHENSIVE METABOLIC PANEL
ALT: 9 U/L (ref 0–35)
AST: 14 U/L (ref 0–37)
Albumin: 4.2 g/dL (ref 3.5–5.2)
Alkaline Phosphatase: 93 U/L (ref 39–117)
BUN: 18 mg/dL (ref 6–23)
CO2: 33 mEq/L — ABNORMAL HIGH (ref 19–32)
Calcium: 9.9 mg/dL (ref 8.4–10.5)
Chloride: 99 mEq/L (ref 96–112)
Creatinine, Ser: 0.53 mg/dL (ref 0.40–1.20)
GFR: 116.22 mL/min (ref >60.00)
Glucose, Bld: 77 mg/dL (ref 70–99)
Potassium: 3.7 mEq/L (ref 3.5–5.1)
Sodium: 140 mEq/L (ref 135–145)
Total Bilirubin: 0.4 mg/dL (ref 0.2–1.2)
Total Protein: 7.2 g/dL (ref 6.0–8.3)

## 2019-08-11 LAB — LIPASE: Lipase: 63 U/L — ABNORMAL HIGH (ref 11.0–59.0)

## 2019-08-11 NOTE — Patient Instructions (Addendum)
Stay of trulicity. Stay off sulindac and OTC aleve, ibuprofen. Stop Vit C for now.  Clear liquids.  We will call with lab results.

## 2019-08-11 NOTE — Assessment & Plan Note (Signed)
Likely SE of trulicity at higher dose.  Eval with cbc, CMET and lipase.  Clear liquids in case it may be  Pancreatitis. Hold trulicity and any NSAIDs.

## 2019-08-11 NOTE — Progress Notes (Signed)
Chief Complaint  Patient presents with  . Abdominal Pain    follow up    History of Present Illness: HPI   64 year old female who presents with abdominal pain.  At last OV we  Increased her dose of trulicity to 1.5 mg weekly.  First  Increased dose was 07/23/2019.  She has noted. low abdominal pain few days later.. then moved up to RUQ and epigastrium. She now feels nausea, no emesis. RUQ occ radiates to her back.   Regular BMs. No heartburn on pantoprazole.  no blood in stool.  She is drinking more liquids.    She has lost weight . Wt Readings from Last 3 Encounters:  08/11/19 244 lb 8 oz (110.9 kg)  07/16/19 251 lb 12.8 oz (114.2 kg)  06/23/19 250 lb 1 oz (113.4 kg)   Hx of cholecystectomy, no appendix, s/p gastric bypass.   This visit occurred during the SARS-CoV-2 public health emergency.  Safety protocols were in place, including screening questions prior to the visit, additional usage of staff PPE, and extensive cleaning of exam room while observing appropriate contact time as indicated for disinfecting solutions.   COVID 19 screen:  No recent travel or known exposure to COVID19 The patient denies respiratory symptoms of COVID 19 at this time. The importance of social distancing was discussed today.     Review of Systems  Constitutional: Negative for chills and fever.  HENT: Negative for congestion and ear pain.   Eyes: Negative for pain and redness.  Respiratory: Negative for cough and shortness of breath.   Cardiovascular: Negative for chest pain, palpitations and leg swelling.  Gastrointestinal: Positive for abdominal pain and nausea. Negative for blood in stool, constipation, diarrhea, heartburn, melena and vomiting.  Genitourinary: Negative for dysuria.  Musculoskeletal: Negative for falls and myalgias.  Skin: Negative for rash.  Neurological: Negative for dizziness.  Psychiatric/Behavioral: Negative for depression. The patient is not nervous/anxious.       Past Medical History:  Diagnosis Date  . ALLERGIC RHINITIS 04/03/2007  . ASTHMA, PERSISTENT, MODERATE 04/03/2007  . Cancer ([place])   . Dyspnea on exertion    a. 11/2007 Echo: EF 60%.  Marland Kitchen GERD (gastroesophageal reflux disease)   . 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 Hypertension   . Joint pain    HIPS / LEGS  . Knee injury    RT  . Meningioma ([place])   . Morbid obesity ([place])   . Paroxysmal SVT (supraventricular tachycardia) ([place])    a. 11/2011 48h Holter: RSR, rare PVC's, occas PAC's  . Retinal tear of right eye 01/2016  . Ventral hernia     reports that she has never smoked. She has never used smokeless tobacco. She reports current alcohol use. She reports that she does not use drugs.   Current Outpatient Medications:  .  albuterol (PROAIR HFA) 108 (90 Base) MCG/ACT inhaler, TAKE 2 PUFFS BY MOUTH EVERY 6 HOURS AS NEEDED, Disp: 8.5 Inhaler, Rfl: 11 .  Calcium Citrate-Vitamin D (CITRACAL + D PO), Take 2 tablets by mouth daily. , Disp: , Rfl:  .  Cyanocobalamin (VITAMIN B-12 SL), Place 1 tablet under the tongue every morning., Disp: , Rfl:  .  diltiazem (CARDIZEM CD) 180 MG 24 hr capsule, TAKE 1 CAPSULE BY MOUTH EVERY DAY, Disp: 90 capsule, Rfl: 3 .  Dulaglutide (TRULICITY) 1.5 0000000 SOPN, Inject 1.5 mg into the skin once a week., Disp: 3 mL, Rfl: 11 .  ELDERBERRY PO, Take 1 tablet by mouth daily., Disp: ,  Rfl:  .  fluticasone (FLONASE) 50 MCG/ACT nasal spray, SPRAY 2 SPRAYS INTO EACH NOSTRIL EVERY DAY, Disp: 48 mL, Rfl: 0 .  hydrochlorothiazide (HYDRODIURIL) 25 MG tablet, TAKE 1 TABLET BY MOUTH EVERY DAY, Disp: 90 tablet, Rfl: 1 .  methocarbamol (ROBAXIN) 500 MG tablet, TAKE 1 TABLET 4 TIMES A DAY AS NEEDED FOR SPASMS, Disp: , Rfl:  .  Omega-3 Fatty Acids (FISH OIL) 1200 MG CAPS, Take 1 capsule by mouth daily., Disp: , Rfl:  .  OXTELLAR XR 600 MG TB24, Take 2 tablets by mouth at bedtime., Disp: , Rfl:  .  pantoprazole (PROTONIX) 40 MG tablet, TAKE 1 TABLET BY MOUTH EVERY DAY, Disp: 90 tablet, Rfl:  0 .  sulindac (CLINORIL) 200 MG tablet, Take 200 mg by mouth 2 (two) times daily as needed., Disp: , Rfl:  .  traMADol (ULTRAM) 50 MG tablet, Take 50 mg by mouth every 8 (eight) hours as needed., Disp: , Rfl:  .  Turmeric 500 MG CAPS, Take 1 capsule by mouth daily., Disp: , Rfl:  .  vitamin C (ASCORBIC ACID) 500 MG tablet, Take 1,000 mg by mouth daily., Disp: , Rfl:   Current Facility-Administered Medications:  .  methylPREDNISolone acetate (DEPO-MEDROL) injection 80 mg, 80 mg, Intramuscular, Once, Baity, Regina W, NP   Observations/Objective: Blood pressure 104/78, pulse 69, temperature (!) 97.2 F (36.2 C), resp. rate 12, height 5' 5.5" (1.664 m), weight 244 lb 8 oz (110.9 kg), SpO2 98 %.  Physical Exam Constitutional:      General: She is not in acute distress.    Appearance: Normal appearance. She is well-developed. She is obese. She is not ill-appearing or toxic-appearing.  HENT:     Head: Normocephalic.     Right Ear: Hearing, tympanic membrane, ear canal and external ear normal. Tympanic membrane is not erythematous, retracted or bulging.     Left Ear: Hearing, tympanic membrane, ear canal and external ear normal. Tympanic membrane is not erythematous, retracted or bulging.     Nose: No mucosal edema or rhinorrhea.     Right Sinus: No maxillary sinus tenderness or frontal sinus tenderness.     Left Sinus: No maxillary sinus tenderness or frontal sinus tenderness.     Mouth/Throat:     Pharynx: Uvula midline.  Eyes:     General: Lids are normal. Lids are everted, no foreign bodies appreciated.     Conjunctiva/sclera: Conjunctivae normal.     Pupils: Pupils are equal, round, and reactive to light.  Neck:     Thyroid: No thyroid mass or thyromegaly.     Vascular: No carotid bruit.     Trachea: Trachea normal.  Cardiovascular:     Rate and Rhythm: Normal rate and regular rhythm.     Pulses: Normal pulses.     Heart sounds: Normal heart sounds, S1 normal and S2 normal. No  murmur. No friction rub. No gallop.   Pulmonary:     Effort: Pulmonary effort is normal. No tachypnea or respiratory distress.     Breath sounds: Normal breath sounds. No decreased breath sounds, wheezing, rhonchi or rales.  Abdominal:     General: Bowel sounds are normal.     Palpations: Abdomen is soft.     Tenderness: There is abdominal tenderness in the right upper quadrant, epigastric area and suprapubic area. There is no right CVA tenderness, left CVA tenderness, guarding or rebound.  Musculoskeletal:     Cervical back: Normal range of motion and neck supple.  Skin:  General: Skin is warm and dry.     Findings: No rash.  Neurological:     Mental Status: She is alert.  Psychiatric:        Mood and Affect: Mood is not anxious or depressed.        Speech: Speech normal.        Behavior: Behavior normal. Behavior is cooperative.        Thought Content: Thought content normal.        Judgment: Judgment normal.      Assessment and Plan   Epigastric pain Likely SE of trulicity at higher dose.  Eval with cbc, CMET and lipase.  Clear liquids in case it may be  Pancreatitis. Hold trulicity and any NSAIDs.     [name], MD

## 2019-08-13 ENCOUNTER — Other Ambulatory Visit: Payer: Self-pay | Admitting: Family Medicine

## 2019-08-13 ENCOUNTER — Ambulatory Visit: Payer: BC Managed Care – PPO | Admitting: Family Medicine

## 2019-08-13 ENCOUNTER — Telehealth: Payer: Self-pay | Admitting: Family Medicine

## 2019-08-13 DIAGNOSIS — K861 Other chronic pancreatitis: Secondary | ICD-10-CM

## 2019-08-13 DIAGNOSIS — T50905A Adverse effect of unspecified drugs, medicaments and biological substances, initial encounter: Secondary | ICD-10-CM

## 2019-08-13 NOTE — Telephone Encounter (Signed)
-----   Message from Cloyd Stagers, RT sent at 08/13/2019  9:55 AM EST ----- Regarding: Lab Order for Friday 3.5.2021 Please place lab orders for Friday 3.5.2021, appt notes state "lipid" Thank you, Dyke Maes RT(R)

## 2019-08-14 ENCOUNTER — Other Ambulatory Visit (INDEPENDENT_AMBULATORY_CARE_PROVIDER_SITE_OTHER): Payer: BC Managed Care – PPO

## 2019-08-14 ENCOUNTER — Other Ambulatory Visit: Payer: Self-pay

## 2019-08-14 DIAGNOSIS — K861 Other chronic pancreatitis: Secondary | ICD-10-CM

## 2019-08-14 DIAGNOSIS — T50905A Adverse effect of unspecified drugs, medicaments and biological substances, initial encounter: Secondary | ICD-10-CM

## 2019-08-14 LAB — HEPATIC FUNCTION PANEL
ALT: 10 U/L (ref 0–35)
AST: 15 U/L (ref 0–37)
Albumin: 4.2 g/dL (ref 3.5–5.2)
Alkaline Phosphatase: 86 U/L (ref 39–117)
Bilirubin, Direct: 0.1 mg/dL (ref 0.0–0.3)
Total Bilirubin: 0.5 mg/dL (ref 0.2–1.2)
Total Protein: 7.3 g/dL (ref 6.0–8.3)

## 2019-08-14 LAB — LIPASE: Lipase: 70 U/L — ABNORMAL HIGH (ref 11.0–59.0)

## 2019-08-17 DIAGNOSIS — M533 Sacrococcygeal disorders, not elsewhere classified: Secondary | ICD-10-CM | POA: Insufficient documentation

## 2019-08-17 NOTE — Assessment & Plan Note (Addendum)
We will likely increase trulicity to 1.5 mg weekly, but will await results of lab evaluation.  Encouraged exercise, weight loss, healthy eating habits.

## 2019-08-17 NOTE — Assessment & Plan Note (Signed)
No skin lesion or cyst.  Eval with X-ray.  Possible sacroiliitis.

## 2019-08-17 NOTE — Assessment & Plan Note (Signed)
Eval with labs. 

## 2019-08-20 ENCOUNTER — Telehealth: Payer: Self-pay | Admitting: Family Medicine

## 2019-08-20 DIAGNOSIS — R748 Abnormal levels of other serum enzymes: Secondary | ICD-10-CM

## 2019-08-20 NOTE — Telephone Encounter (Signed)
-----   Message from Ellamae Sia sent at 08/19/2019 10:11 AM EST ----- Regarding: lab orders for Friday, 3.12.21 Lab orders , thanks

## 2019-08-21 ENCOUNTER — Other Ambulatory Visit: Payer: Self-pay

## 2019-08-21 ENCOUNTER — Other Ambulatory Visit (INDEPENDENT_AMBULATORY_CARE_PROVIDER_SITE_OTHER): Payer: BC Managed Care – PPO

## 2019-08-21 DIAGNOSIS — R1013 Epigastric pain: Secondary | ICD-10-CM

## 2019-08-21 DIAGNOSIS — R748 Abnormal levels of other serum enzymes: Secondary | ICD-10-CM

## 2019-08-21 LAB — HEPATIC FUNCTION PANEL
ALT: 9 U/L (ref 0–35)
AST: 14 U/L (ref 0–37)
Albumin: 4 g/dL (ref 3.5–5.2)
Alkaline Phosphatase: 81 U/L (ref 39–117)
Bilirubin, Direct: 0.1 mg/dL (ref 0.0–0.3)
Total Bilirubin: 0.5 mg/dL (ref 0.2–1.2)
Total Protein: 7 g/dL (ref 6.0–8.3)

## 2019-08-21 LAB — AMYLASE: Amylase: 39 U/L (ref 27–131)

## 2019-08-21 LAB — LIPASE: Lipase: 82 U/L — ABNORMAL HIGH (ref 11.0–59.0)

## 2019-08-21 NOTE — Telephone Encounter (Signed)
Patient l/m on triage line stating she was calling for Dr Diona Browner, she received message about her labs and she is very concerned about this. She is still in a lot of pain on her side and also she is having pain under her arms still. She is aware of suggestion to see GI provider and she wanted to know if there was someone she could see today? She said again she is just very concerned.

## 2019-08-21 NOTE — Telephone Encounter (Signed)
Spoke with pt in detail.  Epigastric and RUQ pain improving and no mild, but she remains nauseous.  Some swelling unedr her arms and soreness, possible chills but no fever... noted all these symptoms after her first COVID vaccine.   She has an appt with Dr. Hassell Done her  surgeon on Wednesday 3/17 to make sure her hernia mesh is in place and not contributing to pain.  I suggested abd CT.. but given Dr. Hassell Done  May order a CT... we will await his recommendations on ordering a CT.  We will move forward with GI referral for epigastric pain.

## 2019-08-24 ENCOUNTER — Other Ambulatory Visit: Payer: BC Managed Care – PPO

## 2019-08-27 ENCOUNTER — Other Ambulatory Visit: Payer: Self-pay

## 2019-08-27 ENCOUNTER — Ambulatory Visit: Payer: BC Managed Care – PPO | Admitting: Gastroenterology

## 2019-08-27 ENCOUNTER — Encounter: Payer: Self-pay | Admitting: Gastroenterology

## 2019-08-27 VITALS — BP 110/70 | HR 80 | Temp 98.3°F | Ht 66.0 in | Wt 242.0 lb

## 2019-08-27 DIAGNOSIS — R748 Abnormal levels of other serum enzymes: Secondary | ICD-10-CM | POA: Diagnosis not present

## 2019-08-27 DIAGNOSIS — R110 Nausea: Secondary | ICD-10-CM

## 2019-08-27 DIAGNOSIS — R1013 Epigastric pain: Secondary | ICD-10-CM

## 2019-08-27 MED ORDER — TRAMADOL HCL 50 MG PO TABS: 50.0000 mg | ORAL_TABLET | Freq: Three times a day (TID) | ORAL | 0 refills | Status: DC | PRN

## 2019-08-27 NOTE — Patient Instructions (Signed)
If you are age 64 or older, your body mass index should be between 23-30. Your Body mass index is 39.06 kg/m. If this is out of the aforementioned range listed, please consider follow up with your Primary Care Provider.  If you are age 2 or younger, your body mass index should be between 19-25. Your Body mass index is 39.06 kg/m. If this is out of the aformentioned range listed, please consider follow up with your Primary Care Provider.   Pantoprazole 40 mg twice daily.   You have been scheduled for a CT scan of the abdomen and pelvis at [facility], [place], [place] [address]  You are scheduled on Monday 08/31/19 at 10 am. You should arrive 15 minutes prior to your appointment time for registration. Please follow the written instructions below on the day of your exam:  WARNING: IF YOU ARE ALLERGIC TO IODINE/X-RAY DYE, PLEASE NOTIFY RADIOLOGY IMMEDIATELY AT [phone]! YOU WILL BE GIVEN A 13 HOUR PREMEDICATION PREP.  1) Do not eat or drink anything after 6 am (4 hours prior to your test) 2) You have been given 2 bottles of oral contrast to drink. The solution may taste better if refrigerated, but do NOT add ice or any other liquid to this solution. Shake well before drinking.    Drink 1 bottle of contrast @ 8 am (2 hours prior to your exam)  Drink 1 bottle of contrast @ 9 am (1 hour prior to your exam)  You may take any medications as prescribed with a small amount of water, if necessary. If you take any of the following medications: METFORMIN, GLUCOPHAGE, GLUCOVANCE, AVANDAMET, RIOMET, FORTAMET, Benld MET, JANUMET, GLUMETZA or METAGLIP, you MAY be asked to HOLD this medication 48 hours AFTER the exam.  The purpose of you drinking the oral contrast is to aid in the visualization of your intestinal tract. The contrast solution may cause some diarrhea. Depending on your individual set of symptoms, you may also receive an intravenous injection of x-ray  contrast/dye.   This test typically takes 30-45 minutes to complete.    ____________________________________________________________

## 2019-08-27 NOTE — Progress Notes (Signed)
08/27/2019 Kimberly Patton [id number] December 21, 1955   HISTORY OF PRESENT ILLNESS: This is a 64 year old female who is new to our office.  She is here today at the request of her PCP, Dr. Diona Browner, for evaluation regarding complaints of epigastric abdominal pain.  The patient tells me that she had been on Trulicity for weight loss.  She says that earlier this month she took her last dose of Trulicity and then the next day she received her first Covid vaccine.  The day following her Covid vaccine, therefore 2 days, after her last Trulicity dose, she developed sudden onset epigastric abdominal pain with nausea and upper abdominal bloating.  She says that it is a very heaviness in her upper abdomen.  She had some labs performed and CBC and CMP were normal.  Lipase was slightly elevated at 63.  It is been checked on 2 other occasions since then and was at 2 and then most recently 32 about 1 week ago.  She continues to feel poorly.  She says that yesterday she felt so bad she thought she go to the emergency department.  She says that she was previously on sulindac for different types of arthritic pains, but she says that she had been off of that for at least 1.5 to 2 months prior to the onset of the symptoms.  She says that she feels the pain into her back as well.  She also complains of pains under both of her underarm areas.  She has never seen a GI doctor in the past and therefore has never had EGD or colonoscopy.  She says that she saw Dr. Hassell Done the other day because she had a surgery with him previously for hernia repair and had mesh placed.  She says that he does not think that this is related to her surgery and the mesh.   Past Medical History:  Diagnosis Date  . ALLERGIC RHINITIS 04/03/2007  . ASTHMA, PERSISTENT, MODERATE 04/03/2007  . Cancer ([place])   . Dyspnea on exertion    a. 11/2007 Echo: EF 60%.  Marland Kitchen GERD (gastroesophageal reflux disease)   . 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 Hypertension   . Joint  pain    HIPS / LEGS  . Knee injury    RT  . Meningioma ([place])   . Morbid obesity ([place])   . Paroxysmal SVT (supraventricular tachycardia) ([place])    a. 11/2011 48h Holter: RSR, rare PVC's, occas PAC's  . Retinal tear of right eye 01/2016  . Ventral hernia    Past Surgical History:  Procedure Laterality Date  . APPENDECTOMY  1996  . BREAST CYST EXCISION Right 1994   Benign  . CESAREAN SECTION     x2  . CHOLECYSTECTOMY  1995  . EYE SURGERY  01/2016   Repair retinal tear  . [place] / 1996   x2  . KNEE ARTHROSCOPY W/ MENISCAL REPAIR  07/25/13   right knee Dr. Mardelle Matte  . LAPAROSCOPIC GASTRIC SLEEVE RESECTION N/A 06/08/2013   Procedure: LAPAROSCOPIC GASTRIC SLEEVE RESECTION AND EXCISION OF SEBACEUS CYST FROM MID CHEST takedown of incarcerated ventral hernia and primary repair endoscopy;  Surgeon: [name], MD;  Location: [facility];  Service: General;  Laterality: N/A;  . moles  06/2013   removed 2 moles from under arm and  lowback  . OVARIAN CYST REMOVAL  1970  . PILONIDAL CYST EXCISION  1975  . RETINAL TEAR REPAIR CRYOTHERAPY Right 01/2016   [place]  .  SKIN CANCER EXCISION    . TONSILLECTOMY    . UPPER GI ENDOSCOPY  06/08/2013   Procedure: UPPER GI ENDOSCOPY;  Surgeon: [name], MD;  Location: [facility];  Service: General;;  . US ECHOCARDIOGRAPHY  12/2011   WNL - EF 55-60%, mild MR, grade 1 diastolic dysnfiction (mild)  . VENTRAL HERNIA REPAIR  2000    reports that she has never smoked. She has never used smokeless tobacco. She reports current alcohol use. She reports that she does not use drugs. family history includes Breast cancer in her mother; Diabetes in her father, mother, and another family member; Hemophilia in an other family member; Hypertension in her father, mother, and another family member; Kidney failure in her mother; Stroke in an other family member. Allergies  Allergen Reactions  . Wasp Venom Anaphylaxis  . Prednisone Other (See Comments)     Severe migraine / with taper dose  . Sodium Hypochlorite Rash    Liquid clorox bleach      Outpatient Encounter Medications as of 08/27/2019  Medication Sig  . albuterol (PROAIR HFA) 108 (90 Base) MCG/ACT inhaler TAKE 2 PUFFS BY MOUTH EVERY 6 HOURS AS NEEDED  . Calcium Citrate-Vitamin D (CITRACAL + D PO) Take 2 tablets by mouth daily.   . Cyanocobalamin (VITAMIN B-12 SL) Place 1 tablet under the tongue every morning.  . diltiazem (CARDIZEM CD) 180 MG 24 hr capsule TAKE 1 CAPSULE BY MOUTH EVERY DAY  . Dulaglutide (TRULICITY) 1.5 0000000 SOPN Inject 1.5 mg into the skin once a week.  Marland Kitchen ELDERBERRY PO Take 1 tablet by mouth daily.  . fluticasone (FLONASE) 50 MCG/ACT nasal spray SPRAY 2 SPRAYS INTO EACH NOSTRIL EVERY DAY  . hydrochlorothiazide (HYDRODIURIL) 25 MG tablet TAKE 1 TABLET BY MOUTH EVERY DAY  . methocarbamol (ROBAXIN) 500 MG tablet TAKE 1 TABLET 4 TIMES A DAY AS NEEDED FOR SPASMS  . Omega-3 Fatty Acids (FISH OIL) 1200 MG CAPS Take 1 capsule by mouth daily.  [name] XR 600 MG TB24 Take 2 tablets by mouth at bedtime.  . pantoprazole (PROTONIX) 40 MG tablet TAKE 1 TABLET BY MOUTH EVERY DAY  . traMADol (ULTRAM) 50 MG tablet Take 50 mg by mouth every 8 (eight) hours as needed.  . Turmeric 500 MG CAPS Take 1 capsule by mouth daily.  . vitamin C (ASCORBIC ACID) 500 MG tablet Take 1,000 mg by mouth daily.   Facility-Administered Encounter Medications as of 08/27/2019  Medication  . methylPREDNISolone acetate (DEPO-MEDROL) injection 80 mg     REVIEW OF SYSTEMS  : All other systems reviewed and negative except where noted in the History of Present Illness.   PHYSICAL EXAM: Temp 98.3 F (36.8 C)   Ht 5\' 6"  (1.676 m)   Wt 242 lb (109.8 kg)   BMI 39.06 kg/m  General: Well developed white female in no acute distress; she looks well, non-toxic appearing Head: Normocephalic and atraumatic Eyes:  Sclerae anicteric, conjunctiva pink. Ears: Normal auditory acuity Lungs: Clear  throughout to auscultation; no increased WOB Heart: Regular rate and rhythm; no M/R/G. Abdomen: Soft, non-distended.  BS present.  Epigastric TTP. Musculoskeletal: Symmetrical with no gross deformities  Skin: No lesions on visible extremities Extremities: No edema  Neurological: Alert oriented x 4, grossly non-focal Psychological:  Alert and cooperative. Normal mood and affect  ASSESSMENT AND PLAN: *64 year old female with complaints of epigastric abdominal pain, nausea, and minimally elevated lipase.  Lipase only up to 82.  We will begin with CT scan  of the abdomen and pelvis with contrast to rule out pancreatitis.  Other etiologies for the elevated lipase could just be nausea or potentially she has a duodenal ulcer that could be causing mild elevation.  She was on Trulicity and received her first Covid vaccine the day prior to the onset of her symptoms, which have now been present for the past 16 days or so.  Other labs unremarkable.  The patient stated that she felt so bad yesterday that she thought she could almost go to the emergency department.  We were able to get the patient scheduled for CT for tomorrow, Friday, but patient stated that she could not do tomorrow as she was going to be out of town.  Has been rescheduled for Monday, March 22.  Pending results may need to consider EGD for evaluation.  She is on pantoprazole 40 mg daily and I am going to increase that to twice a day for now.  She says that Dr. Hassell Done offered her pain medicine when she saw him the other day, but she said that she did not need it because she had some tramadol still at home from previous issues.  She says that when she got home she realized that she did not have any left.  I told her that I would prescribe her a very small amount, but from here on out she would need to receive it from Dr. Hassell Done or her PCP.  Prescription sent. *Colorectal cancer screening: Patient's never had colonoscopy in the past.  We discussed that  this would need to be considered in the near future once these other issues are improved/resolved.  CC:  Jinny Sanders, MD

## 2019-08-28 ENCOUNTER — Other Ambulatory Visit: Payer: BC Managed Care – PPO

## 2019-08-28 ENCOUNTER — Other Ambulatory Visit: Payer: Self-pay | Admitting: Family Medicine

## 2019-08-31 ENCOUNTER — Ambulatory Visit (HOSPITAL_BASED_OUTPATIENT_CLINIC_OR_DEPARTMENT_OTHER)
Admission: RE | Admit: 2019-08-31 | Discharge: 2019-08-31 | Disposition: A | Discharge status: Home / Self Care | Payer: BC Managed Care – PPO | Source: Ambulatory Visit | Attending: Gastroenterology | Admitting: Gastroenterology

## 2019-08-31 ENCOUNTER — Other Ambulatory Visit: Payer: Self-pay

## 2019-08-31 DIAGNOSIS — R110 Nausea: Secondary | ICD-10-CM | POA: Insufficient documentation

## 2019-08-31 DIAGNOSIS — R748 Abnormal levels of other serum enzymes: Secondary | ICD-10-CM | POA: Diagnosis present

## 2019-08-31 DIAGNOSIS — R1013 Epigastric pain: Secondary | ICD-10-CM | POA: Diagnosis not present

## 2019-08-31 MED ORDER — IOHEXOL 300 MG/ML  SOLN
100.0000 mL | Freq: Once | INTRAMUSCULAR | Status: AC | PRN
  Administered 2019-08-31: 100 mL via INTRAVENOUS

## 2019-09-04 NOTE — Progress Notes (Signed)
Reviewed and agree with documentation and assessment and plan. K. Veena Lorey Pallett , MD   

## 2019-09-20 DIAGNOSIS — M533 Sacrococcygeal disorders, not elsewhere classified: Secondary | ICD-10-CM

## 2019-10-22 ENCOUNTER — Encounter: Payer: Self-pay | Admitting: Family Medicine

## 2019-10-22 ENCOUNTER — Telehealth (INDEPENDENT_AMBULATORY_CARE_PROVIDER_SITE_OTHER): Payer: BC Managed Care – PPO | Admitting: Family Medicine

## 2019-10-22 VITALS — HR 80 | Temp 97.8°F | Ht 65.5 in | Wt 247.0 lb

## 2019-10-22 DIAGNOSIS — J029 Acute pharyngitis, unspecified: Secondary | ICD-10-CM | POA: Diagnosis not present

## 2019-10-22 NOTE — Assessment & Plan Note (Signed)
Most liekly allergies vs refluxvs viral.  Pt feels well otherwise so less likely viral. Add Xyzal to allergy regimen. Continue flonase 2 sprays per nostril daily.  Avoid reflux trigger.  If not improving in 34- days.. increase pantoprazole to twice daily x 1 week.. If any new  infectious symptoms.Marland Kitchen COVID19 test, consider at urgent

## 2019-10-22 NOTE — Progress Notes (Signed)
Chief Complaint  Patient presents with  . Sore Throat    C/o severe throat and postnasal drainage.  Started a few days ago.    History of Present Illness: HPI   64 year old female presents with new onset  severe sore throat and post nasal drip. She was feeling well until a few days ago when she started having severe sire throat  Feels swelling in bilateral neck and redness in posterior.  No cough, no SOB, no wheeze.  She is sneezing some.   She has been out in yard a lot.   No ear pain, no face pain.   She reports history of allergies and mild intermittent asthma.  She is chronically on flonase.  Gargling salt water, mucinex.  No N/V/D. No loss of taste or smell.   COVID19 vaccines x 2 completed.     This visit occurred during the SARS-CoV-2 public health emergency.  Safety protocols were in place, including screening questions prior to the visit, additional usage of staff PPE, and extensive cleaning of exam room while observing appropriate contact time as indicated for disinfecting solutions.   COVID 19 screen:  No recent travel or known exposure to COVID19 The patient denies respiratory symptoms of COVID 19 at this time. The importance of social distancing was discussed today.     Review of Systems  Constitutional: Negative for chills and fever.  HENT: Positive for sore throat. Negative for congestion and ear pain.   Eyes: Negative for pain and redness.  Respiratory: Negative for cough and shortness of breath.   Cardiovascular: Negative for chest pain, palpitations and leg swelling.  Gastrointestinal: Negative for abdominal pain, blood in stool, constipation, diarrhea, nausea and vomiting.  Genitourinary: Negative for dysuria.  Musculoskeletal: Negative for falls and myalgias.  Skin: Negative for rash.  Neurological: Negative for dizziness.  Psychiatric/Behavioral: Negative for depression. The patient is not nervous/anxious.       Past Medical History:   Diagnosis Date  . ALLERGIC RHINITIS 04/03/2007  . ASTHMA, PERSISTENT, MODERATE 04/03/2007  . Cancer ([place])   . Dyspnea on exertion    a. 11/2007 Echo: EF 60%.  Marland Kitchen GERD (gastroesophageal reflux disease)   . 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 Hypertension   . Joint pain    HIPS / LEGS  . Knee injury    RT  . Meningioma ([place])   . Morbid obesity ([place])   . Paroxysmal SVT (supraventricular tachycardia) ([place])    a. 11/2011 48h Holter: RSR, rare PVC's, occas PAC's  . Retinal tear of right eye 01/2016  . Ventral hernia     reports that she has never smoked. She has never used smokeless tobacco. She reports current alcohol use. She reports that she does not use drugs.   Current Outpatient Medications:  .  albuterol (PROAIR HFA) 108 (90 Base) MCG/ACT inhaler, TAKE 2 PUFFS BY MOUTH EVERY 6 HOURS AS NEEDED, Disp: 8.5 Inhaler, Rfl: 11 .  Calcium Citrate-Vitamin D (CITRACAL + D PO), Take 2 tablets by mouth daily. , Disp: , Rfl:  .  Cyanocobalamin (VITAMIN B-12 SL), Place 1 tablet under the tongue every morning., Disp: , Rfl:  .  diltiazem (CARDIZEM CD) 180 MG 24 hr capsule, TAKE 1 CAPSULE BY MOUTH EVERY DAY, Disp: 90 capsule, Rfl: 3 .  ELDERBERRY PO, Take 1 tablet by mouth daily., Disp: , Rfl:  .  fluticasone (FLONASE) 50 MCG/ACT nasal spray, SPRAY 2 SPRAYS INTO EACH NOSTRIL EVERY DAY, Disp: 48 mL, Rfl: 0 .  hydrochlorothiazide (HYDRODIURIL) 25 MG tablet, TAKE 1 TABLET BY MOUTH EVERY DAY, Disp: 90 tablet, Rfl: 1 .  methocarbamol (ROBAXIN) 500 MG tablet, TAKE 1 TABLET 4 TIMES A DAY AS NEEDED FOR SPASMS, Disp: , Rfl:  .  Omega-3 Fatty Acids (FISH OIL) 1200 MG CAPS, Take 1 capsule by mouth daily., Disp: , Rfl:  .  OXTELLAR XR 600 MG TB24, Take 2 tablets by mouth at bedtime., Disp: , Rfl:  .  pantoprazole (PROTONIX) 40 MG tablet, Take 1 tablet (40 mg total) by mouth 2 (two) times daily., Disp: 180 tablet, Rfl: 0 .  traMADol (ULTRAM) 50 MG tablet, Take 1 tablet (50 mg total) by mouth every 8 (eight) hours as  needed., Disp: 10 tablet, Rfl: 0 .  Turmeric 500 MG CAPS, Take 1 capsule by mouth daily., Disp: , Rfl:  .  vitamin C (ASCORBIC ACID) 500 MG tablet, Take 1,000 mg by mouth daily., Disp: , Rfl:   Current Facility-Administered Medications:  .  methylPREDNISolone acetate (DEPO-MEDROL) injection 80 mg, 80 mg, Intramuscular, Once, Baity, Regina W, NP   Observations/Objective: Pulse 80, temperature 97.8 F (36.6 C), height 5' 5.5" (1.664 m), weight 247 lb (112 kg).  Physical Exam  Physical Exam Constitutional:      General: The patient is not in acute distress. Pulmonary:     Effort: Pulmonary effort is normal. No respiratory distress.  Neurological:     Mental Status: The patient is alert and oriented to person, place, and time.  Psychiatric:        Mood and Affect: Mood normal.        Behavior: Behavior normal.   Assessment and Plan    Sore throat Most liekly allergies vs refluxvs viral.  Pt feels well otherwise so less likely viral. Add Xyzal to allergy regimen. Continue flonase 2 sprays per nostril daily.  Avoid reflux trigger.  If not improving in 34- days.. increase pantoprazole to twice daily x 1 week.. If any new  infectious symptoms.Marland Kitchen COVID19 test, consider at urgent    [name], MD

## 2019-10-22 NOTE — Patient Instructions (Signed)
Add Xyzal to allergy regimen. Continue flonase 2 sprays per nostril daily.  Avoid reflux trigger.  If not improving in 34- days.. increase pantoprazole to twice daily x 1 week.. If any new  infectious symptoms.Marland Kitchen COVID19 test, consider at urgent

## 2019-11-20 ENCOUNTER — Other Ambulatory Visit: Payer: Self-pay | Admitting: Family Medicine

## 2019-11-23 ENCOUNTER — Other Ambulatory Visit: Payer: Self-pay

## 2019-11-23 ENCOUNTER — Ambulatory Visit (INDEPENDENT_AMBULATORY_CARE_PROVIDER_SITE_OTHER): Payer: BC Managed Care – PPO | Admitting: Family Medicine

## 2019-11-23 ENCOUNTER — Encounter: Payer: Self-pay | Admitting: Family Medicine

## 2019-11-23 VITALS — BP 124/86 | HR 73 | Temp 97.5°F | Ht 65.5 in | Wt 249.1 lb

## 2019-11-23 DIAGNOSIS — R0989 Other specified symptoms and signs involving the circulatory and respiratory systems: Secondary | ICD-10-CM

## 2019-11-23 DIAGNOSIS — R599 Enlarged lymph nodes, unspecified: Secondary | ICD-10-CM | POA: Diagnosis not present

## 2019-11-23 DIAGNOSIS — R09A2 Foreign body sensation, throat: Secondary | ICD-10-CM | POA: Insufficient documentation

## 2019-11-23 DIAGNOSIS — E041 Nontoxic single thyroid nodule: Secondary | ICD-10-CM | POA: Diagnosis not present

## 2019-11-23 DIAGNOSIS — R198 Other specified symptoms and signs involving the digestive system and abdomen: Secondary | ICD-10-CM | POA: Insufficient documentation

## 2019-11-23 NOTE — Assessment & Plan Note (Signed)
Most likely causes are allergy pnd or gerd  Asked her to go back on xyzal daily  Also continue flonase  Cbc drawn today   If no imp-may consider re addressing reflux  Enc her to watch diet   Salt water gargle for discomfort  If severe throat pain -will have to have a strep test at urgent care

## 2019-11-23 NOTE — Assessment & Plan Note (Signed)
Pt feels some fullness/tenderness ant cervical bilat  Also had swollen LN in axillae after covid vaccines  I am unable to palp any individual swollen LN today  She c/o continuous pnd/ globus sensation as well  Lab today incl cbc/esr/mono spot and tsh

## 2019-11-23 NOTE — Progress Notes (Signed)
Subjective:    Patient ID: Kimberly Patton, female    DOB: 06-21-1955, 64 y.o.   MRN: [id number]  This visit occurred during the SARS-CoV-2 public health emergency.  Safety protocols were in place, including screening questions prior to the visit, additional usage of staff PPE, and extensive cleaning of exam room while observing appropriate contact time as indicated for disinfecting solutions.    HPI  63 yo pt of Dr Diona Browner presents with c/o swollen glands in her neck    Wt Readings from Last 3 Encounters:  11/23/19 249 lb 2 oz (113 kg)  10/22/19 247 lb (112 kg)  08/27/19 242 lb (109.8 kg)   40.83 kg/m  Non smoker  Feels the swelling on both sides of neck-anterior  A little sore to the touch  Nothing posterior    Was sick a month ago-that resolved except for the swollen glands  Post nasal drip and ongoing ear problems and sinus problems  Throat is uncomfortable- burning at times  Clears throat a lot  ? Allergies -this was suspected   covid-had her vaccines  Last one was in march  She did get swollen LN after the first one (under her arms) -have gone down   Taking the generic protonix for GERD Also hiatal hernia  Watches diet closely  Has gained weight - which worsened the problem     Uses flonase daily -for years  She took xyzal -stopped taking it after she got better     She does have h/o thyroid nodule in the past  No problems swallowing  Lab Results  Component Value Date   TSH 0.87 01/19/2019   had FNA on the L side - result was B9   Lab Results  Component Value Date   WBC 6.2 08/11/2019   HGB 12.0 08/11/2019   HCT 36.8 08/11/2019   MCV 78.3 08/11/2019   PLT 320.0 08/11/2019   Does have hx of iron def anemia in the past Lab Results  Component Value Date   FERRITIN 4.2 (L) 07/16/2019    Patient Active Problem List   Diagnosis Date Noted  . Lymph nodes enlarged 11/23/2019  . Globus sensation 11/23/2019  . Sore throat 10/22/2019  . Nausea  without vomiting 08/27/2019  . Elevated lipase 08/27/2019  . Coccygeal pain, acute 08/17/2019  . Abdominal pain, epigastric 08/11/2019  . Hypercholesteremia 06/09/2019  . Neck swelling 02/14/2019  . Ear pain, right 09/12/2018  . Chronic neuropathic pain 07/31/2018  . Thyroid nodule 04/15/2018  . Chest wall injury 10/04/2017  . Skin lesion of scalp 09/06/2017  . Hypokalemia 06/08/2017  . Iron deficiency anemia 06/08/2017  . Osteoporosis 12/04/2016  . Osteoarthritis of knee 07/17/2016  . Numbness and tingling of both feet 01/27/2016  . Meningioma ([place]) 01/18/2016  . Family history of aortic stenosis 01/20/2015  . Status post laparoscopic sleeve gastrectomy Dec 2014 06/08/2013  . Morbid obesity ([place]) 12/08/2012  . HTN (hypertension) 10/11/2011  . Varicose veins of lower extremities with other complications 27/74/1287  . FIBROIDS, UTERUS 04/03/2007  . Allergic rhinitis 04/03/2007  . Asthma, mild intermittent 04/03/2007   Past Medical History:  Diagnosis Date  . ALLERGIC RHINITIS 04/03/2007  . ASTHMA, PERSISTENT, MODERATE 04/03/2007  . Cancer ([place])   . Dyspnea on exertion    a. 11/2007 Echo: EF 60%.  Marland Kitchen GERD (gastroesophageal reflux disease)   . 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 Hypertension   . Joint pain    HIPS / LEGS  . Knee injury  RT  . Meningioma ([place])   . Morbid obesity ([place])   . Paroxysmal SVT (supraventricular tachycardia) ([place])    a. 11/2011 48h Holter: RSR, rare PVC's, occas PAC's  . Retinal tear of right eye 01/2016  . Ventral hernia    Past Surgical History:  Procedure Laterality Date  . APPENDECTOMY  1996  . BREAST CYST EXCISION Right 1994   Benign  . CESAREAN SECTION     x2  . CHOLECYSTECTOMY  1995  . EYE SURGERY  01/2016   Repair retinal tear  . [place] / 1996   x2  . KNEE ARTHROSCOPY W/ MENISCAL REPAIR  07/25/13   right knee Dr. Mardelle Matte  . LAPAROSCOPIC GASTRIC SLEEVE RESECTION N/A 06/08/2013   Procedure: LAPAROSCOPIC GASTRIC SLEEVE RESECTION AND  EXCISION OF SEBACEUS CYST FROM MID CHEST takedown of incarcerated ventral hernia and primary repair endoscopy;  Surgeon: [name], MD;  Location: [facility];  Service: General;  Laterality: N/A;  . moles  06/2013   removed 2 moles from under arm and  lowback  . OVARIAN CYST REMOVAL  1970  . PILONIDAL CYST EXCISION  1975  . RETINAL TEAR REPAIR CRYOTHERAPY Right 01/2016   [place]  . SKIN CANCER EXCISION    . TONSILLECTOMY    . UPPER GI ENDOSCOPY  06/08/2013   Procedure: UPPER GI ENDOSCOPY;  Surgeon: [name], MD;  Location: [facility];  Service: General;;  . US ECHOCARDIOGRAPHY  12/2011   WNL - EF 55-60%, mild MR, grade 1 diastolic dysnfiction (mild)  . VENTRAL HERNIA REPAIR  2000   Social History   Tobacco Use  . Smoking status: Never Smoker  . Smokeless tobacco: Never Used  . Tobacco comment: smoke at age 7-12  Substance Use Topics  . Alcohol use: Yes    Alcohol/week: 0.0 standard drinks    Comment: Occasionally  . Drug use: No   Family History  Problem Relation Age of Onset  . Breast cancer Mother   . Diabetes Mother   . Hypertension Mother   . Kidney failure Mother   . Diabetes Father   . Hypertension Father   . Diabetes Other   . Hypertension Other   . Stroke Other   . Hemophilia Other    Allergies  Allergen Reactions  . Wasp Venom Anaphylaxis  . Prednisone Other (See Comments)    Severe migraine / with taper dose  . Sodium Hypochlorite Rash    Liquid clorox bleach   Current Outpatient Medications on File Prior to Visit  Medication Sig Dispense Refill  . albuterol (PROAIR HFA) 108 (90 Base) MCG/ACT inhaler TAKE 2 PUFFS BY MOUTH EVERY 6 HOURS AS NEEDED 8.5 Inhaler 11  . Calcium Citrate-Vitamin D (CITRACAL + D PO) Take 2 tablets by mouth daily.     . Cyanocobalamin (VITAMIN B-12 SL) Place 1 tablet under the tongue every morning.    . diltiazem (CARDIZEM CD) 180 MG 24 hr capsule TAKE 1 CAPSULE BY MOUTH EVERY DAY 90 capsule 3  . ELDERBERRY PO Take 1 tablet  by mouth daily.    . fluticasone (FLONASE) 50 MCG/ACT nasal spray SPRAY 2 SPRAYS INTO EACH NOSTRIL EVERY DAY 48 mL 0  . hydrochlorothiazide (HYDRODIURIL) 25 MG tablet TAKE 1 TABLET BY MOUTH EVERY DAY 90 tablet 1  . methocarbamol (ROBAXIN) 500 MG tablet TAKE 1 TABLET 4 TIMES A DAY AS NEEDED FOR SPASMS    . Omega-3 Fatty Acids (FISH OIL) 1200 MG CAPS Take 1 capsule  by mouth daily.    [name] XR 600 MG TB24 Take 2 tablets by mouth at bedtime.    . pantoprazole (PROTONIX) 40 MG tablet TAKE 1 TABLET BY MOUTH TWICE A DAY 180 tablet 1  . traMADol (ULTRAM) 50 MG tablet Take 1 tablet (50 mg total) by mouth every 8 (eight) hours as needed. 10 tablet 0  . Turmeric 500 MG CAPS Take 1 capsule by mouth daily.    . vitamin C (ASCORBIC ACID) 500 MG tablet Take 1,000 mg by mouth daily.     Current Facility-Administered Medications on File Prior to Visit  Medication Dose Route Frequency Provider Last Rate Last Admin  . methylPREDNISolone acetate (DEPO-MEDROL) injection 80 mg  80 mg Intramuscular Once Jearld Fenton, NP        Review of Systems  Constitutional: Negative for activity change, appetite change, fatigue, fever and unexpected weight change.  HENT: Positive for postnasal drip and sore throat. Negative for congestion, ear pain, facial swelling, rhinorrhea, sinus pressure, trouble swallowing and voice change.        R ear fullness is chronic  Eyes: Negative for pain, redness and visual disturbance.  Respiratory: Negative for cough, choking, shortness of breath, wheezing and stridor.   Cardiovascular: Negative for chest pain and palpitations.  Gastrointestinal: Negative for abdominal pain, blood in stool, constipation and diarrhea.  Endocrine: Negative for polydipsia and polyuria.  Genitourinary: Negative for dysuria, frequency and urgency.  Musculoskeletal: Negative for arthralgias, back pain and myalgias.  Skin: Negative for pallor and rash.  Allergic/Immunologic: Negative for environmental  allergies.  Neurological: Negative for dizziness, syncope and headaches.  Hematological: Negative for adenopathy. Does not bruise/bleed easily.  Psychiatric/Behavioral: Negative for decreased concentration and dysphoric mood. The patient is not nervous/anxious.        Objective:   Physical Exam Constitutional:      General: She is not in acute distress.    Appearance: Normal appearance. She is well-developed. She is obese. She is not ill-appearing or diaphoretic.  HENT:     Head: Normocephalic and atraumatic.     Right Ear: Tympanic membrane and ear canal normal.     Left Ear: Tympanic membrane and ear canal normal.     Nose: Rhinorrhea present.     Mouth/Throat:     Mouth: Mucous membranes are moist.     Pharynx: Oropharynx is clear. No oropharyngeal exudate or posterior oropharyngeal erythema.  Eyes:     General: No scleral icterus.       Right eye: No discharge.        Left eye: No discharge.     Conjunctiva/sclera: Conjunctivae normal.     Pupils: Pupils are equal, round, and reactive to light.  Neck:     Thyroid: No thyromegaly.     Vascular: No carotid bruit or JVD.     Comments: Pt has some mild tenderness in submandibular /ant cervical area but unable to palp individual LN   Cardiovascular:     Rate and Rhythm: Normal rate and regular rhythm.     Heart sounds: Normal heart sounds. No gallop.   Pulmonary:     Effort: Pulmonary effort is normal. No respiratory distress.     Breath sounds: Normal breath sounds. No wheezing or rales.  Abdominal:     General: Bowel sounds are normal. There is no distension or abdominal bruit.     Palpations: Abdomen is soft. There is no mass.     Tenderness: There is no abdominal tenderness.  Musculoskeletal:     Cervical back: Normal range of motion and neck supple. No tenderness.  Lymphadenopathy:     Cervical: No cervical adenopathy.  Skin:    General: Skin is warm and dry.     Coloration: Skin is not pale.     Findings: No  erythema or rash.  Neurological:     Mental Status: She is alert.     Sensory: No sensory deficit.     Coordination: Coordination normal.     Deep Tendon Reflexes: Reflexes are normal and symmetric. Reflexes normal.  Psychiatric:        Mood and Affect: Mood normal.           Assessment & Plan:   Problem List Items Addressed This Visit      Endocrine   Thyroid nodule    FNA last was b9 Pt denies trouble swallowing but has had a globus sensation  TSH drawn today at pt req      Relevant Orders   TSH     Immune and Lymphatic   Lymph nodes enlarged    Pt feels some fullness/tenderness ant cervical bilat  Also had swollen LN in axillae after covid vaccines  I am unable to palp any individual swollen LN today  She c/o continuous pnd/ globus sensation as well  Lab today incl cbc/esr/mono spot and tsh      Relevant Orders   CBC with Differential/Platelet   Sedimentation Rate   Mononucleosis screen     Other   Globus sensation - Primary    Most likely causes are allergy pnd or gerd  Asked her to go back on xyzal daily  Also continue flonase  Cbc drawn today   If no imp-may consider re addressing reflux  Enc her to watch diet   Salt water gargle for discomfort  If severe throat pain -will have to have a strep test at urgent care

## 2019-11-23 NOTE — Assessment & Plan Note (Signed)
FNA last was b9 Pt denies trouble swallowing but has had a globus sensation  TSH drawn today at pt req

## 2019-11-23 NOTE — Patient Instructions (Addendum)
Get back on xyzal 1 pill each evening  Continue flonase   If this throat problem does not improve in 1-2 weeks please call and let us know   Acid reflux can cause similar symptoms   Keep watching diet   Labs today  (cbc and ESR)

## 2019-11-24 ENCOUNTER — Encounter: Payer: Self-pay | Admitting: Family Medicine

## 2019-11-24 LAB — CBC WITH DIFFERENTIAL/PLATELET
Basophils Absolute: 0 10*3/uL (ref 0.0–0.1)
Basophils Relative: 0.2 % (ref 0.0–3.0)
Eosinophils Absolute: 0.1 10*3/uL (ref 0.0–0.7)
Eosinophils Relative: 2 % (ref 0.0–5.0)
HCT: 34 % — ABNORMAL LOW (ref 36.0–46.0)
Hemoglobin: 11.2 g/dL — ABNORMAL LOW (ref 12.0–15.0)
Lymphocytes Relative: 22.7 % (ref 12.0–46.0)
Lymphs Abs: 1.7 10*3/uL (ref 0.7–4.0)
MCHC: 32.9 g/dL (ref 30.0–36.0)
MCV: 76.3 fl — ABNORMAL LOW (ref 78.0–100.0)
Monocytes Absolute: 0.5 10*3/uL (ref 0.1–1.0)
Monocytes Relative: 7.4 % (ref 3.0–12.0)
Neutro Abs: 4.9 10*3/uL (ref 1.4–7.7)
Neutrophils Relative %: 67.7 % (ref 43.0–77.0)
Platelets: 345 10*3/uL (ref 150.0–400.0)
RBC: 4.45 Mil/uL (ref 3.87–5.11)
RDW: 15.5 % (ref 11.5–15.5)
WBC: 7.3 10*3/uL (ref 4.0–10.5)

## 2019-11-24 LAB — MONONUCLEOSIS SCREEN: Mono Screen: NEGATIVE

## 2019-11-24 LAB — SEDIMENTATION RATE: Sed Rate: 39 mm/hr — ABNORMAL HIGH (ref 0–30)

## 2019-11-24 LAB — TSH: TSH: 0.66 u[IU]/mL (ref 0.35–4.50)

## 2019-12-03 ENCOUNTER — Other Ambulatory Visit: Payer: Self-pay | Admitting: Family Medicine

## 2019-12-11 ENCOUNTER — Other Ambulatory Visit: Payer: Self-pay

## 2019-12-11 ENCOUNTER — Telehealth: Payer: Self-pay | Admitting: Family Medicine

## 2019-12-11 ENCOUNTER — Encounter: Payer: Self-pay | Admitting: Emergency Medicine

## 2019-12-11 ENCOUNTER — Emergency Department: Payer: BC Managed Care – PPO

## 2019-12-11 ENCOUNTER — Emergency Department
Admission: EM | Admit: 2019-12-11 | Discharge: 2019-12-11 | Disposition: A | Discharge status: Home / Self Care | Payer: BC Managed Care – PPO | Attending: Emergency Medicine | Admitting: Emergency Medicine

## 2019-12-11 DIAGNOSIS — Z86011 Personal history of benign neoplasm of the brain: Secondary | ICD-10-CM | POA: Diagnosis not present

## 2019-12-11 DIAGNOSIS — R0789 Other chest pain: Secondary | ICD-10-CM | POA: Diagnosis not present

## 2019-12-11 DIAGNOSIS — K2100 Gastro-esophageal reflux disease with esophagitis, without bleeding: Secondary | ICD-10-CM | POA: Insufficient documentation

## 2019-12-11 DIAGNOSIS — Z96651 Presence of right artificial knee joint: Secondary | ICD-10-CM | POA: Diagnosis not present

## 2019-12-11 DIAGNOSIS — R079 Chest pain, unspecified: Secondary | ICD-10-CM | POA: Diagnosis present

## 2019-12-11 DIAGNOSIS — I10 Essential (primary) hypertension: Secondary | ICD-10-CM | POA: Insufficient documentation

## 2019-12-11 DIAGNOSIS — J4540 Moderate persistent asthma, uncomplicated: Secondary | ICD-10-CM | POA: Insufficient documentation

## 2019-12-11 DIAGNOSIS — R1013 Epigastric pain: Secondary | ICD-10-CM

## 2019-12-11 DIAGNOSIS — Z79899 Other long term (current) drug therapy: Secondary | ICD-10-CM | POA: Diagnosis not present

## 2019-12-11 LAB — CBC
HCT: 36.6 % (ref 36.0–46.0)
Hemoglobin: 11.4 g/dL — ABNORMAL LOW (ref 12.0–15.0)
MCH: 24.7 pg — ABNORMAL LOW (ref 26.0–34.0)
MCHC: 31.1 g/dL (ref 30.0–36.0)
MCV: 79.2 fL — ABNORMAL LOW (ref 80.0–100.0)
Platelets: 329 10*3/uL (ref 150–400)
RBC: 4.62 MIL/uL (ref 3.87–5.11)
RDW: 16 % — ABNORMAL HIGH (ref 11.5–15.5)
WBC: 8.3 10*3/uL (ref 4.0–10.5)
nRBC: 0 % (ref 0.0–0.2)

## 2019-12-11 LAB — HEPATIC FUNCTION PANEL
ALT: 14 U/L (ref 0–44)
AST: 24 U/L (ref 15–41)
Albumin: 4.1 g/dL (ref 3.5–5.0)
Alkaline Phosphatase: 86 U/L (ref 38–126)
Bilirubin, Direct: 0.1 mg/dL (ref 0.0–0.2)
Indirect Bilirubin: 0.6 mg/dL (ref 0.3–0.9)
Total Bilirubin: 0.7 mg/dL (ref 0.3–1.2)
Total Protein: 8.1 g/dL (ref 6.5–8.1)

## 2019-12-11 LAB — BASIC METABOLIC PANEL
Anion gap: 15 (ref 5–15)
BUN: 20 mg/dL (ref 8–23)
CO2: 26 mmol/L (ref 22–32)
Calcium: 9.6 mg/dL (ref 8.9–10.3)
Chloride: 98 mmol/L (ref 98–111)
Creatinine, Ser: 0.49 mg/dL (ref 0.44–1.00)
GFR calc Af Amer: >60 mL/min (ref >60)
GFR calc non Af Amer: >60 mL/min (ref >60)
Glucose, Bld: 97 mg/dL (ref 70–99)
Potassium: 3.7 mmol/L (ref 3.5–5.1)
Sodium: 139 mmol/L (ref 135–145)

## 2019-12-11 LAB — LIPASE, BLOOD: Lipase: 46 U/L (ref 11–51)

## 2019-12-11 LAB — TROPONIN I (HIGH SENSITIVITY): Troponin I (High Sensitivity): 4 ng/L (ref <18)

## 2019-12-11 MED ORDER — ONDANSETRON HCL 4 MG/2ML IJ SOLN: 4.0000 mg | Freq: Once | INTRAMUSCULAR | Status: AC

## 2019-12-11 MED ORDER — ALUM & MAG HYDROXIDE-SIMETH 200-200-20 MG/5ML PO SUSP
15.0000 mL | Freq: Once | ORAL | Status: AC
  Administered 2019-12-11: 15 mL via ORAL
  Filled 2019-12-11: qty 30

## 2019-12-11 MED ORDER — ONDANSETRON HCL 4 MG/2ML IJ SOLN
INTRAMUSCULAR | Status: AC
  Administered 2019-12-11: 4 mg via INTRAVENOUS
  Filled 2019-12-11: qty 2

## 2019-12-11 MED ORDER — SODIUM CHLORIDE 0.9% FLUSH: 3.0000 mL | Freq: Once | INTRAVENOUS | Status: DC

## 2019-12-11 MED ORDER — LIDOCAINE VISCOUS HCL 2 % MT SOLN
15.0000 mL | Freq: Once | OROMUCOSAL | Status: AC
  Administered 2019-12-11: 15 mL via ORAL
  Filled 2019-12-11: qty 15

## 2019-12-11 NOTE — ED Triage Notes (Signed)
Says her pcp has been working on gerd recently--which caused a sore throat.  Today she has pain epigastric area, but says her breath feels hot and feels like she cant get a deep breath.  Patient is not in distress and able to talk without difficulty

## 2019-12-11 NOTE — Telephone Encounter (Signed)
Take protonix separately. Referral sent.

## 2019-12-11 NOTE — ED Notes (Signed)
AAOx3.  Skin warm and dry.  NAD 

## 2019-12-11 NOTE — Telephone Encounter (Signed)
Ms. Mcgarrigle notified as instructed by telephone.  Patient states understanding.

## 2019-12-11 NOTE — ED Provider Notes (Signed)
[facility] Emergency Department Provider Note   ____________________________________________   First MD Initiated Contact with Patient 12/11/19 [phone]     (approximate)  I have reviewed the triage vital signs and the nursing notes.   HISTORY  Chief Complaint Chest Pain and Shortness of Breath    HPI Kimberly Patton is a 64 y.o. female with past medical history of hypertension, SVT, GERD, and asthma who presents to the ED complaining of chest pain and shortness of breath.  Patient reports that she has been dealing with a sensation of swelling and pain in her throat over the past couple of weeks, initially treated with a steroid shot a couple of weeks ago.  This seemed to worsen yesterday and become associated with some difficulty breathing and nonproductive cough.  She reports a history of hiatal hernia and takes Protonix daily, took an extra 1 last night due to her symptoms.  She also developed some pain in her epigastric area and right side of her chest, which is ongoing until now.  She denies any fever, nausea, vomiting, changes in bowel movements, leg swelling or pain.        Past Medical History:  Diagnosis Date  . ALLERGIC RHINITIS 04/03/2007  . ASTHMA, PERSISTENT, MODERATE 04/03/2007  . Cancer ([place])   . Dyspnea on exertion    a. 11/2007 Echo: EF 60%.  Marland Kitchen GERD (gastroesophageal reflux disease)   . 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 Hypertension   . Joint pain    HIPS / LEGS  . Knee injury    RT  . Meningioma ([place])   . Morbid obesity ([place])   . Paroxysmal SVT (supraventricular tachycardia) ([place])    a. 11/2011 48h Holter: RSR, rare PVC's, occas PAC's  . Retinal tear of right eye 01/2016  . Ventral hernia     Patient Active Problem List   Diagnosis Date Noted  . Lymph nodes enlarged 11/23/2019  . Globus sensation 11/23/2019  . Sore throat 10/22/2019  . Nausea without vomiting 08/27/2019  . Elevated lipase 08/27/2019  . Coccygeal pain, acute 08/17/2019    . Abdominal pain, epigastric 08/11/2019  . Hypercholesteremia 06/09/2019  . Neck swelling 02/14/2019  . Ear pain, right 09/12/2018  . Chronic neuropathic pain 07/31/2018  . Thyroid nodule 04/15/2018  . Chest wall injury 10/04/2017  . Skin lesion of scalp 09/06/2017  . Hypokalemia 06/08/2017  . Iron deficiency anemia 06/08/2017  . Osteoporosis 12/04/2016  . Osteoarthritis of knee 07/17/2016  . Numbness and tingling of both feet 01/27/2016  . Meningioma ([place]) 01/18/2016  . Family history of aortic stenosis 01/20/2015  . Status post laparoscopic sleeve gastrectomy Dec 2014 06/08/2013  . Morbid obesity ([place]) 12/08/2012  . HTN (hypertension) 10/11/2011  . Varicose veins of lower extremities with other complications 54/65/0354  . FIBROIDS, UTERUS 04/03/2007  . Allergic rhinitis 04/03/2007  . Asthma, mild intermittent 04/03/2007    Past Surgical History:  Procedure Laterality Date  . APPENDECTOMY  1996  . BREAST CYST EXCISION Right 1994   Benign  . CESAREAN SECTION     x2  . CHOLECYSTECTOMY  1995  . EYE SURGERY  01/2016   Repair retinal tear  . [place] / 1996   x2  . KNEE ARTHROSCOPY W/ MENISCAL REPAIR  07/25/13   right knee Dr. Mardelle Matte  . LAPAROSCOPIC GASTRIC SLEEVE RESECTION N/A 06/08/2013   Procedure: LAPAROSCOPIC GASTRIC SLEEVE RESECTION AND EXCISION OF SEBACEUS CYST FROM MID CHEST takedown of incarcerated ventral hernia  and primary repair endoscopy;  Surgeon: [name], MD;  Location: [facility] ORS;  Service: General;  Laterality: N/A;  . moles  06/2013   removed 2 moles from under arm and  lowback  . OVARIAN CYST REMOVAL  1970  . PILONIDAL CYST EXCISION  1975  . RETINAL TEAR REPAIR CRYOTHERAPY Right 01/2016   [place]  . SKIN CANCER EXCISION    . TONSILLECTOMY    . UPPER GI ENDOSCOPY  06/08/2013   Procedure: UPPER GI ENDOSCOPY;  Surgeon: [name], MD;  Location: [facility] ORS;  Service: General;;  . US ECHOCARDIOGRAPHY  12/2011   WNL - EF 55-60%, mild MR,  grade 1 diastolic dysnfiction (mild)  . VENTRAL HERNIA REPAIR  2000    Prior to Admission medications   Medication Sig Start Date End Date Taking? Authorizing Provider  albuterol (PROAIR HFA) 108 (90 Base) MCG/ACT inhaler TAKE 2 PUFFS BY MOUTH EVERY 6 HOURS AS NEEDED 08/27/18   Bedsole, Amy E, MD  Calcium Citrate-Vitamin D (CITRACAL + D PO) Take 2 tablets by mouth daily.     [provider]  cefdinir (OMNICEF) 300 MG capsule Take 300 mg by mouth 2 (two) times daily. 11/30/19   [provider]  Cyanocobalamin (VITAMIN B-12 SL) Place 1 tablet under the tongue every morning.    [provider]  diltiazem (CARDIZEM CD) 180 MG 24 hr capsule TAKE 1 CAPSULE BY MOUTH EVERY DAY Patient taking differently: Take 180 mg by mouth daily.  06/29/19   Bedsole, Amy E, MD  ELDERBERRY PO Take 1 tablet by mouth daily.    [provider]  etodolac (LODINE) 400 MG tablet Take 400 mg by mouth every 8 (eight) hours as needed. 11/05/19   [provider]  fluticasone (FLONASE) 50 MCG/ACT nasal spray SPRAY 2 SPRAYS INTO EACH NOSTRIL EVERY DAY 12/03/19   Bedsole, Amy E, MD  hydrochlorothiazide (HYDRODIURIL) 25 MG tablet TAKE 1 TABLET BY MOUTH EVERY DAY Patient taking differently: Take 25 mg by mouth daily.  12/03/19   Bedsole, Amy E, MD  Omega-3 Fatty Acids (FISH OIL) 1200 MG CAPS Take 1 capsule by mouth daily.    [provider]  OXTELLAR XR 600 MG TB24 Take 2 tablets by mouth at bedtime. 12/26/18   [provider]  pantoprazole (PROTONIX) 40 MG tablet TAKE 1 TABLET BY MOUTH TWICE A DAY Patient taking differently: Take 40 mg by mouth 2 (two) times daily.  11/20/19   Bedsole, Amy E, MD  Promethazine-Codeine 6.25-10 MG/5ML SOLN Take 5 mLs by mouth every 6 (six) hours as needed. 11/30/19   [provider]  traMADol (ULTRAM) 50 MG tablet Take 1 tablet (50 mg total) by mouth every 8 (eight) hours as needed. 08/27/19   Zehr, Laban Emperor, PA-C  Turmeric 500 MG CAPS  Take 1 capsule by mouth daily.    [provider]  vitamin C (ASCORBIC ACID) 500 MG tablet Take 1,000 mg by mouth daily.    [provider]    Allergies Wasp venom, Prednisone, and Sodium hypochlorite  Family History  Problem Relation Age of Onset  . Breast cancer Mother   . Diabetes Mother   . Hypertension Mother   . Kidney failure Mother   . Diabetes Father   . Hypertension Father   . Diabetes Other   . Hypertension Other   . Stroke Other   . Hemophilia Other     Social History Social History   Tobacco Use  . Smoking  status: Never Smoker  . Smokeless tobacco: Never Used  . Tobacco comment: smoke at age 57-12  Substance Use Topics  . Alcohol use: Yes    Alcohol/week: 0.0 standard drinks    Comment: Occasionally  . Drug use: No    Review of Systems  Constitutional: No fever/chills Eyes: No visual changes. ENT: Positive for sore throat. Cardiovascular: Positive for chest pain. Respiratory: Positive for cough and shortness of breath. Gastrointestinal: No abdominal pain.  No nausea, no vomiting.  No diarrhea.  No constipation. Genitourinary: Negative for dysuria. Musculoskeletal: Negative for back pain. Skin: Negative for rash. Neurological: Negative for headaches, focal weakness or numbness.  ____________________________________________   PHYSICAL EXAM:  VITAL SIGNS: ED Triage Vitals  Enc Vitals Group     BP 12/11/19 0833 (!) 144/88     Pulse Rate 12/11/19 0833 84     Resp 12/11/19 0833 16     Temp 12/11/19 0833 98.1 F (36.7 C)     Temp Source 12/11/19 0833 Oral     SpO2 12/11/19 0833 99 %     Weight 12/11/19 0832 248 lb (112.5 kg)     Height 12/11/19 0832 5\' 7"  (1.702 m)     Head Circumference --      Peak Flow --      Pain Score 12/11/19 0831 0     Pain Loc --      Pain Edu? --      Excl. in [place]? --     Constitutional: Alert and oriented. Eyes: Conjunctivae are normal. Head: Atraumatic. Nose: No  congestion/rhinnorhea. Mouth/Throat: Mucous membranes are moist.  Oropharynx clear with no erythema or exudate. Neck: Normal ROM Cardiovascular: Normal rate, regular rhythm. Grossly normal heart sounds. Respiratory: Normal respiratory effort.  No retractions. Lungs CTAB.  Anterior chest wall tenderness to palpation. Gastrointestinal: Soft and nontender. No distention. Genitourinary: deferred Musculoskeletal: No lower extremity tenderness nor edema. Neurologic:  Normal speech and language. No gross focal neurologic deficits are appreciated. Skin:  Skin is warm, dry and intact. No rash noted. Psychiatric: Mood and affect are normal. Speech and behavior are normal.  ____________________________________________   LABS (all labs ordered are listed, but only abnormal results are displayed)  Labs Reviewed  CBC - Abnormal; Notable for the following components:      Result Value   Hemoglobin 11.4 (*)    MCV 79.2 (*)    MCH 24.7 (*)    RDW 16.0 (*)    All other components within normal limits  BASIC METABOLIC PANEL  HEPATIC FUNCTION PANEL  LIPASE, BLOOD  TROPONIN I (HIGH SENSITIVITY)   ____________________________________________  EKG  ED ECG REPORT I, Blake Divine, the attending physician, personally viewed and interpreted this ECG.   Date: 12/11/2019  EKG Time: 8:26  Rate: 73  Rhythm: normal sinus rhythm  Axis: Normal  Intervals:Incomplete RBBB  ST&T Change: None   PROCEDURES  Procedure(s) performed (including Critical Care):  Procedures   ____________________________________________   INITIAL IMPRESSION / ASSESSMENT AND PLAN / ED COURSE       64 year old female with past medical history of hypertension, SVT, GERD, and asthma presents to the ED complaining of sensation of swelling and discomfort in her throat for the past couple of weeks, developed some cough and difficulty breathing last night as well as central chest pain.  Symptoms are atypical for ACS, EKG  shows no evidence of arrhythmia or ischemia, we will screen troponin but if this is negative I doubt ACS given her constant symptoms  for greater than 2 hours.  I doubt PE given reassuring vitals and atypical symptoms.  We will check chest x-ray and labs, she has some tenderness over her chest wall but no abdominal tenderness to warrant further imaging of abdomen.  I doubt biliary colic or cholecystitis, will screen LFTs and lipase.  Symptoms seem most consistent with gastritis and GERD, we will treat with GI cocktail and reassess.  Lab work reassuring, troponin within normal limits, LFTs and lipase are also unremarkable.  Chest x-ray negative for acute process and there is no apparent emergent etiology for patient's symptoms.  She reports that the pain is improved following GI cocktail, but is now feeling slightly nauseous due to the poor taste.  She was treated with Zofran with improvement and is appropriate for discharge home.  She was counseled to increase her pantoprazole dose to twice daily and to schedule follow-up with GI.  She was counseled to return to the ED for new or worsening symptoms, patient agrees with plan.      ____________________________________________   FINAL CLINICAL IMPRESSION(S) / ED DIAGNOSES  Final diagnoses:  Atypical chest pain  Gastroesophageal reflux disease with esophagitis without hemorrhage     ED Discharge Orders    None       Note:  This document was prepared using Dragon voice recognition software and may include unintentional dictation errors.   Blake Divine, MD 12/11/19 1031

## 2019-12-11 NOTE — Discharge Instructions (Signed)
Your symptoms today are likely due to reflux and your hiatal hernia.  You may increase your pantoprazole dose to twice daily to help reduce the symptoms and take over-the-counter Pepcid or Tums as needed to alleviate further uncontrolled symptoms.  Please schedule follow-up with a GI doctor to consider endoscopy and also follow-up with your PCP.  If you have any new or worsening symptoms, please return to the ER for reevaluation.

## 2019-12-11 NOTE — Telephone Encounter (Signed)
Patient was seen at ER today. Dr.Bedsole said she'll refer patient to GI. Patient said she prefers [facility], but will go anywhere they can get her in the soonest.  Patient said she's ok not coming to see Dr.Bedsole on Tuesday since she's getting the referral.  Patient said she feels very nauseous since she had the GI cocktail and wants to know if she should still feel nauseous.  She is aware to take the Pantoprazole 2 x a day. Patient wants to know if she should take the Pantoprazole together or separately?

## 2019-12-25 ENCOUNTER — Other Ambulatory Visit: Payer: Self-pay | Admitting: Gastroenterology

## 2019-12-25 DIAGNOSIS — R1319 Other dysphagia: Secondary | ICD-10-CM

## 2019-12-25 DIAGNOSIS — K449 Diaphragmatic hernia without obstruction or gangrene: Secondary | ICD-10-CM

## 2019-12-30 ENCOUNTER — Ambulatory Visit
Admission: RE | Admit: 2019-12-30 | Discharge: 2019-12-30 | Disposition: A | Discharge status: Home / Self Care | Payer: BC Managed Care – PPO | Source: Ambulatory Visit | Attending: Gastroenterology | Admitting: Gastroenterology

## 2019-12-30 ENCOUNTER — Other Ambulatory Visit: Payer: Self-pay

## 2019-12-30 DIAGNOSIS — R1310 Dysphagia, unspecified: Secondary | ICD-10-CM | POA: Diagnosis not present

## 2019-12-30 DIAGNOSIS — K449 Diaphragmatic hernia without obstruction or gangrene: Secondary | ICD-10-CM | POA: Diagnosis present

## 2019-12-30 DIAGNOSIS — R1319 Other dysphagia: Secondary | ICD-10-CM

## 2019-12-31 ENCOUNTER — Telehealth: Payer: Self-pay | Admitting: Family Medicine

## 2019-12-31 NOTE — Telephone Encounter (Signed)
Advised pt of PCP msg and if any symptoms worsen to contact office. Pt verbalized understanding.

## 2019-12-31 NOTE — Telephone Encounter (Signed)
Pt c/o low pulse, 57 to 58 pulse, when sitting or laying for about 1-1.5 wks. Pt has a smart watch which reports her HR. Pt reports her pulse resumes to 70s or 80s when standing.  Pt also reports pain in her chest but this she has had due to her hiatal hernia and has not changed. Pt reports when her pulse is low she also has hot flashes on the top of her head. While on the phone she said her pulse was 80 and she was in the parking lot of her ENT office waiting for her apt at 1pm. She said she thinks all of the stress from her hiatal hernia and trouble swallowing is causing her low pulse. She reports she feels like her body is falling apart right now and is very concerned. Pt denies any other symptoms. Pt also sent a mychart msg with these concerns. Advised pt this msg would also be sent to PCP and this office would f/u. Pt requested office visit unless PCP did not think she needed one. Scheduled pt for OV for tomorrow. Advised if any symptoms change of worsen to contact this office. Pt verbalized understanding.

## 2019-12-31 NOTE — Telephone Encounter (Signed)
Agree with OV tommorow. No clear need for urgent visit or ER visit today.

## 2020-01-01 ENCOUNTER — Encounter: Payer: Self-pay | Admitting: Family Medicine

## 2020-01-01 ENCOUNTER — Ambulatory Visit: Payer: BC Managed Care – PPO | Admitting: Family Medicine

## 2020-01-01 ENCOUNTER — Other Ambulatory Visit: Payer: Self-pay

## 2020-01-01 VITALS — BP 90/62 | HR 83 | Temp 98.3°F | Ht 65.5 in | Wt 245.0 lb

## 2020-01-01 DIAGNOSIS — R001 Bradycardia, unspecified: Secondary | ICD-10-CM | POA: Diagnosis not present

## 2020-01-01 DIAGNOSIS — I499 Cardiac arrhythmia, unspecified: Secondary | ICD-10-CM | POA: Diagnosis not present

## 2020-01-01 DIAGNOSIS — R221 Localized swelling, mass and lump, neck: Secondary | ICD-10-CM

## 2020-01-01 DIAGNOSIS — D509 Iron deficiency anemia, unspecified: Secondary | ICD-10-CM | POA: Diagnosis not present

## 2020-01-01 DIAGNOSIS — I951 Orthostatic hypotension: Secondary | ICD-10-CM | POA: Insufficient documentation

## 2020-01-01 MED ORDER — DILTIAZEM HCL ER COATED BEADS 120 MG PO CP24: 120.0000 mg | ORAL_CAPSULE | Freq: Every day | ORAL | 11 refills | Status: DC

## 2020-01-01 NOTE — Assessment & Plan Note (Signed)
May be due to overtreatment with medication in pt who has lost weight and stopped caffeine.  Decrease CCB, hold HCTZ if not improving.

## 2020-01-01 NOTE — Patient Instructions (Addendum)
Decrease diltiazem to 120 mg daily.  Hold HCTZ if BP remaining < 90/60.  Follow BP and HR daily. Call with measurements early next week.  Please stop at the lab to have labs drawn.

## 2020-01-01 NOTE — Telephone Encounter (Signed)
When she arrives.. you can do an EKG prior to me seeing her.

## 2020-01-01 NOTE — Assessment & Plan Note (Signed)
Hx of SVT.  HR lower when resting as appropriate but dropping < 60 in last 2 weeks.  Decrease CCB as this is likely suppressing HR.  EKG in office today unremarkable.   Will eval with labs to verify no worsening anemia, iron levels.

## 2020-01-01 NOTE — Assessment & Plan Note (Signed)
No appreciable swelling on exam but pt insists she there is a differnece.  ? Secondary to GERD... really globus sensation.  Eval with US neck to make sure no lymphadenopathy or thyroid issue. Hx of thyroid nodule.

## 2020-01-01 NOTE — Assessment & Plan Note (Signed)
Will eval with labs to verify no worsening anemia, iron levels.

## 2020-01-01 NOTE — Telephone Encounter (Signed)
[place] Night - Client TELEPHONE ADVICE RECORD [facility] Patient Name: Kimberly Patton Gender: Female DOB: Jun 16, 1955 Age: 64 Y 2 M 4 D Return Phone Number: [phone] (Primary) Address: City/State/Zip: [place] [address] Client [facility] Night - Client Client Site [place] Physician [name] - MD Contact Type Call Who Is Calling Patient / Member / Family / Caregiver Call Type Triage / Clinical Relationship To Patient Self Return Phone Number [phone] (Primary) Chief Complaint Heart palpitations or irregular heartbeat Reason for Call Symptomatic / Request for [name] states that as soon as she lays down her pulse drops and when she sits up it raises back up. When she lays down she is having lightheadedness in her forehead. She does have an appointment tomorrow. Translation No Nurse Assessment Nurse: Acey Lav, RN, Estill Bamberg Date/Time (Eastern Time): 12/31/2019 10:48:44 PM Confirm and document reason for call. If symptomatic, describe symptoms. ---Caller states that as soon as she lays down her pulse drops and when she sits up it raises back up. When she lays down she is having lightheadedness in her forehead. She does have an appointment tomorrow for her pulse issues, was told to call tonight if anything happens tonight. HR: 77 sitting up. But laying down it goes down: 54. Has hiatal hernia and GERD. Stress, had endoscopy scope done too: last week. Upper GI scope yesterday. This started about a week ago. Has the patient had close contact with a person known or suspected to have the novel coronavirus illness OR traveled / lives in area with major community spread (including international travel) in the last 14 days from the onset of symptoms? * If Asymptomatic, screen for exposure and travel within the last 14 days. ---No Does the patient have any new  or worsening symptoms? ---Yes Will a triage be completed? ---Yes Related visit to physician within the last 2 weeks? ---No Does the PT have any chronic conditions? (i.e. diabetes, asthma, this includes High risk factors for pregnancy, etc.) ---Yes List chronic conditions. ---GERD, hiatal hernia, HTN. Is this a behavioral health or substance abuse call? ---No PLEASE NOTE: All timestamps contained within this report are represented as [place] Standard Time. CONFIDENTIALTY NOTICE: This fax transmission is intended only for the addressee. It contains information that is legally privileged, confidential or otherwise protected from use or disclosure. If you are not the intended recipient, you are strictly prohibited from reviewing, disclosing, copying using or disseminating any of this information or taking any action in reliance on or regarding this information. If you have received this fax in error, please notify us immediately by telephone so that we can arrange for its return to [place]. Phone: [phone], Toll-Free: [phone], Fax: [phone] Page: 2 of 3 Call Id: [id number] Guidelines Guideline Title Affirmed Question Affirmed Notes Nurse Date/Time Eilene Ghazi Time) Endoscopy (Upper GI) Symptoms and Questions [1] MILD-MODERATE sore throat or hoarse voice AND [2] lasts more than 3 days after endoscopy Acey Lav, RN, Estill Bamberg 12/31/2019 10:52:18 PM Dizziness - Lightheadedness [1] MODERATE dizziness (e.g., interferes with normal activities) AND [2] has NOT been evaluated by physician for this (Exception: dizziness caused by heat exposure, sudden standing, or poor fluid intake) Acey Lav, RN, Estill Bamberg 12/31/2019 10:54:59 PM Disp. Time Eilene Ghazi Time) Disposition Final User 12/31/2019 10:45:31 PM Send To Call Back Waiting For Nurse Shireen Quan 12/31/2019 10:47:03 PM Send To Clinical Follow Up Norton Blizzard, RNQuillian Quince 12/31/2019 10:47:06 PM Send To Clinical Follow Up Norton Blizzard,  RN,  Quillian Quince 12/31/2019 10:47:09 PM Send To Clinical Follow Up Norton Blizzard, RN, Quillian Quince 12/31/2019 10:47:11 PM Send To Clinical Follow Up Norton Blizzard, RN, Daniel 12/31/2019 10:47:15 PM Send To Clinical Follow Up Norton Blizzard, RN, Daniel 12/31/2019 10:47:18 PM Send To Clinical Follow Up Norton Blizzard, RN, Daniel 12/31/2019 10:47:21 PM Send To Clinical Follow Up Norton Blizzard, RN, Daniel 12/31/2019 10:47:24 PM Send To Clinical Follow Up Norton Blizzard, RN, Daniel 12/31/2019 10:47:27 PM Send To Clinical Follow Up Norton Blizzard, RN, Daniel 12/31/2019 10:47:32 PM Send To Clinical Follow Up Norton Blizzard, RN, Daniel 12/31/2019 10:54:44 PM Call PCP within 24 Hours Seeber, RN, Estill Bamberg 12/31/2019 10:57:45 PM See PCP within 24 Hours Yes Acey Lav, RN, Shelly Coss Disagree/Comply Comply Caller Understands Yes PreDisposition Did not know what to do Care Advice Given Per Guideline CALL PCP WITHIN 24 HOURS: * You need to discuss this with your doctor (or NP/PA) within the next 24 hours. * IF OFFICE WILL BE OPEN: Call the office when it opens tomorrow morning. CARE ADVICE given per Endoscopy (Upper GI) Symptoms and Questions (Adult). * You become worse * Fever occurs CALL BACK IF: SEE PCP WITHIN 24 HOURS: * IF OFFICE WILL BE OPEN: You need to be examined within the next 24 hours. Call your doctor (or NP/PA) when the office opens and make an appointment. CARE ADVICE given per Dizziness (Adult) guideline. LIE DOWN AND REST: * Lie down with feet elevated for 1 hour. * Drink several glasses of fruit juice, other clear fluids or water. DRINK FLUIDS: * You become worse. CALL BACK IF: PLEASE NOTE: All timestamps contained within this report are represented as [place] Standard Time. CONFIDENTIALTY NOTICE: This fax transmission is intended only for the addressee. It contains information that is legally privileged, confidential or otherwise protected from use or disclosure. If you are not the intended recipient, you are strictly prohibited from  reviewing, disclosing, copying using or disseminating any of this information or taking any action in reliance on or regarding this information. If you have received this fax in error, please notify us immediately by telephone so that we can arrange for its return to [place]. Phone: [phone], Toll-Free: [phone], Fax: [phone] Page: 3 of 3 Call Id: [id number] Comments User: Shireen Quan Date/Time (Eastern Time): 12/31/2019 10:45:18 PM CBWN: Caller states as long as she is up her pulse is ok but as soon as she lays down it drops. When she called it was 18 and she got lightheaded. She is going to see Dr. Diona Browner tomorrow and was told to call her if anything happens. User: Karl Ito, RN Date/Time Eilene Ghazi Time): 12/31/2019 10:54:26 PM o2 sat on pulse ox: 99 percent. Referrals REFERRED TO PCP OFFICE

## 2020-01-01 NOTE — Telephone Encounter (Signed)
I spoke with pt and pt wants to keep appt already scheduled for today at 3 PM. UC & ED precautions given and pt voiced understanding. Pt said heart rate is better when she is sitting in recliner and pt is not having any CP,SOB,H/A or dizziness right now.FYI to Dr Diona Browner.

## 2020-01-01 NOTE — Progress Notes (Signed)
Chief Complaint  Patient presents with  . Irregular Heart Beat    x 2 weeks     History of Present Illness: HPI    64 year old female with history of  Hiatal hernia, dysphagia, GERD, iron mild deficiency anemia, HTN  And SVT on diltizaem presents with low HR with lying down in last 2 weeks. She notes hot flashes off and on. Occ light headness off and on.. checked pulse and noted HR on fit bit.. HR drops to 50s when lying down at night. o2Sat in 90s. No new CP or SOB.Marland Kitchen except for what she associates with the hiatal hernia  And GERD. Under a lot of stress.   Today in office her BP is low. She has dry mouth but drinking a lot water.  BP Readings from Last 3 Encounters:  01/01/20 (!) 90/62  12/11/19 140/80  11/23/19 124/86   Has lost 4 lbs since June. No caffeine now since  GERD issues. Wt Readings from Last 3 Encounters:  01/01/20 (!) 245 lb (111.1 kg)  12/11/19 248 lb (112.5 kg)  11/23/19 249 lb 2 oz (113 kg)    She was seen by Dr. Lovena Le and Dr. Percival Spanish in past 2013 neg stress test  Recent cbc : hg 11.4, normal thyroid, nml CMET.   Today EKG: normal EKG, normal sinus rhythm, incomplete RBBB unchanged from previous tracings.   Planning hiatal hernia surgery with D.r Hassell Done in 01/2020.   She seems anxious at today's office visit.. I wonder if this is contributing to her multiple physical complaints.   This visit occurred during the SARS-CoV-2 public health emergency.  Safety protocols were in place, including screening questions prior to the visit, additional usage of staff PPE, and extensive cleaning of exam room while observing appropriate contact time as indicated for disinfecting solutions.   COVID 19 screen:  No recent travel or known exposure to COVID19 The patient denies respiratory symptoms of COVID 19 at this time. The importance of social distancing was discussed today.     Review of Systems  Constitutional: Negative for chills and fever.  HENT: Negative  for congestion and ear pain.   Eyes: Negative for pain and redness.  Respiratory: Negative for cough and shortness of breath.   Cardiovascular: Negative for chest pain, palpitations and leg swelling.  Gastrointestinal: Negative for abdominal pain, blood in stool, constipation, diarrhea, nausea and vomiting.  Genitourinary: Negative for dysuria.  Musculoskeletal: Negative for falls and myalgias.  Skin: Negative for rash.  Neurological: Positive for dizziness.  Psychiatric/Behavioral: Negative for depression. The patient is not nervous/anxious.       Past Medical History:  Diagnosis Date  . ALLERGIC RHINITIS 04/03/2007  . ASTHMA, PERSISTENT, MODERATE 04/03/2007  . Cancer ([place])   . Dyspnea on exertion    a. 11/2007 Echo: EF 60%.  Marland Kitchen GERD (gastroesophageal reflux disease)   . 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 Hypertension   . Joint pain    HIPS / LEGS  . Knee injury    RT  . Meningioma ([place])   . Morbid obesity ([place])   . Paroxysmal SVT (supraventricular tachycardia) ([place])    a. 11/2011 48h Holter: RSR, rare PVC's, occas PAC's  . Retinal tear of right eye 01/2016  . Ventral hernia     reports that she has never smoked. She has never used smokeless tobacco. She reports current alcohol use. She reports that she does not use drugs.   Current Outpatient Medications:  .  albuterol (PROAIR HFA)  108 (90 Base) MCG/ACT inhaler, TAKE 2 PUFFS BY MOUTH EVERY 6 HOURS AS NEEDED, Disp: 8.5 Inhaler, Rfl: 11 .  Calcium Citrate-Vitamin D (CITRACAL + D PO), Take 2 tablets by mouth daily. , Disp: , Rfl:  .  cefdinir (OMNICEF) 300 MG capsule, Take 300 mg by mouth 2 (two) times daily., Disp: , Rfl:  .  Cyanocobalamin (VITAMIN B-12 SL), Place 1 tablet under the tongue every morning., Disp: , Rfl:  .  diltiazem (CARDIZEM CD) 180 MG 24 hr capsule, TAKE 1 CAPSULE BY MOUTH EVERY DAY (Patient taking differently: Take 180 mg by mouth daily. ), Disp: 90 capsule, Rfl: 3 .  ELDERBERRY PO, Take 1 tablet by mouth daily., Disp: ,  Rfl:  .  etodolac (LODINE) 400 MG tablet, Take 400 mg by mouth every 8 (eight) hours as needed., Disp: , Rfl:  .  famotidine (PEPCID) 20 MG tablet, Take by mouth., Disp: , Rfl:  .  fluticasone (FLONASE) 50 MCG/ACT nasal spray, SPRAY 2 SPRAYS INTO EACH NOSTRIL EVERY DAY, Disp: 48 mL, Rfl: 0 .  hydrochlorothiazide (HYDRODIURIL) 25 MG tablet, TAKE 1 TABLET BY MOUTH EVERY DAY (Patient taking differently: Take 25 mg by mouth daily. ), Disp: 90 tablet, Rfl: 1 .  Omega-3 Fatty Acids (FISH OIL) 1200 MG CAPS, Take 1 capsule by mouth daily., Disp: , Rfl:  .  OXTELLAR XR 600 MG TB24, Take 2 tablets by mouth at bedtime., Disp: , Rfl:  .  pantoprazole (PROTONIX) 40 MG tablet, TAKE 1 TABLET BY MOUTH TWICE A DAY (Patient taking differently: Take 40 mg by mouth 2 (two) times daily. ), Disp: 180 tablet, Rfl: 1 .  traMADol (ULTRAM) 50 MG tablet, Take 1 tablet (50 mg total) by mouth every 8 (eight) hours as needed., Disp: 10 tablet, Rfl: 0 .  Turmeric 500 MG CAPS, Take 1 capsule by mouth daily., Disp: , Rfl:  .  vitamin C (ASCORBIC ACID) 500 MG tablet, Take 1,000 mg by mouth daily., Disp: , Rfl:   Current Facility-Administered Medications:  .  methylPREDNISolone acetate (DEPO-MEDROL) injection 80 mg, 80 mg, Intramuscular, Once, Baity, Regina W, NP   Observations/Objective: Blood pressure (!) 90/62, pulse 83, temperature 98.3 F (36.8 C), temperature source Temporal, height 5' 5.5" (1.664 m), weight (!) 245 lb (111.1 kg), SpO2 98 %.  Physical Exam Constitutional:      General: She is not in acute distress.    Appearance: Normal appearance. She is well-developed. She is obese. She is not ill-appearing or toxic-appearing.  HENT:     Head: Normocephalic.     Right Ear: Hearing, tympanic membrane, ear canal and external ear normal. Tympanic membrane is not erythematous, retracted or bulging.     Left Ear: Hearing, tympanic membrane, ear canal and external ear normal. Tympanic membrane is not erythematous,  retracted or bulging.     Nose: No mucosal edema or rhinorrhea.     Right Sinus: No maxillary sinus tenderness or frontal sinus tenderness.     Left Sinus: No maxillary sinus tenderness or frontal sinus tenderness.     Mouth/Throat:     Pharynx: Uvula midline.  Eyes:     General: Lids are normal. Lids are everted, no foreign bodies appreciated.     Conjunctiva/sclera: Conjunctivae normal.     Pupils: Pupils are equal, round, and reactive to light.  Neck:     Thyroid: No thyroid mass or thyromegaly.     Vascular: No carotid bruit.     Trachea: Trachea normal.  Cardiovascular:  Rate and Rhythm: Normal rate and regular rhythm.     Pulses: Normal pulses.     Heart sounds: Normal heart sounds, S1 normal and S2 normal. No murmur heard.  No friction rub. No gallop.   Pulmonary:     Effort: Pulmonary effort is normal. No tachypnea or respiratory distress.     Breath sounds: Normal breath sounds. No decreased breath sounds, wheezing, rhonchi or rales.  Abdominal:     General: Bowel sounds are normal.     Palpations: Abdomen is soft.     Tenderness: There is no abdominal tenderness.  Musculoskeletal:     Cervical back: Normal range of motion and neck supple.  Lymphadenopathy:     Cervical: No cervical adenopathy.     Right cervical: No superficial, deep or posterior cervical adenopathy.    Left cervical: No superficial or posterior cervical adenopathy.  Skin:    General: Skin is warm and dry.     Findings: No rash.  Neurological:     Mental Status: She is alert.  Psychiatric:        Mood and Affect: Mood is not anxious or depressed.        Speech: Speech normal.        Behavior: Behavior normal. Behavior is cooperative.        Thought Content: Thought content normal.        Judgment: Judgment normal.      Assessment and Plan Orthostatic hypotension May be due to overtreatment with medication in pt who has lost weight and stopped caffeine.  Decrease CCB, hold HCTZ if not  improving.  Bradycardia Hx of SVT.  HR lower when resting as appropriate but dropping < 60 in last 2 weeks.  Decrease CCB as this is likely suppressing HR.  EKG in office today unremarkable.   Will eval with labs to verify no worsening anemia, iron levels.  Neck swelling No appreciable swelling on exam but pt insists she there is a differnece.  ? Secondary to GERD... really globus sensation.  Eval with US neck to make sure no lymphadenopathy or thyroid issue. Hx of thyroid nodule.  Iron deficiency anemia  Will eval with labs to verify no worsening anemia, iron levels.       [name], MD

## 2020-01-02 DIAGNOSIS — R001 Bradycardia, unspecified: Secondary | ICD-10-CM

## 2020-01-02 LAB — BASIC METABOLIC PANEL
BUN: 20 mg/dL (ref 7–25)
CO2: 30 mmol/L (ref 20–32)
Calcium: 9.7 mg/dL (ref 8.6–10.4)
Chloride: 100 mmol/L (ref 98–110)
Creat: 0.57 mg/dL (ref 0.50–0.99)
Glucose, Bld: 101 mg/dL — ABNORMAL HIGH (ref 65–99)
Potassium: 3.7 mmol/L (ref 3.5–5.3)
Sodium: 139 mmol/L (ref 135–146)

## 2020-01-02 LAB — CBC WITH DIFFERENTIAL/PLATELET
Absolute Monocytes: 420 cells/uL (ref 200–950)
Basophils Absolute: 42 cells/uL (ref 0–200)
Basophils Relative: 0.6 %
Eosinophils Absolute: 77 cells/uL (ref 15–500)
Eosinophils Relative: 1.1 %
HCT: 36.4 % (ref 35.0–45.0)
Hemoglobin: 11.7 g/dL (ref 11.7–15.5)
Lymphs Abs: 2002 cells/uL (ref 850–3900)
MCH: 25.2 pg — ABNORMAL LOW (ref 27.0–33.0)
MCHC: 32.1 g/dL (ref 32.0–36.0)
MCV: 78.3 fL — ABNORMAL LOW (ref 80.0–100.0)
MPV: 9.5 fL (ref 7.5–12.5)
Monocytes Relative: 6 %
Neutro Abs: 4459 cells/uL (ref 1500–7800)
Neutrophils Relative %: 63.7 %
Platelets: 351 10*3/uL (ref 140–400)
RBC: 4.65 10*6/uL (ref 3.80–5.10)
RDW: 15.6 % — ABNORMAL HIGH (ref 11.0–15.0)
Total Lymphocyte: 28.6 %
WBC: 7 10*3/uL (ref 3.8–10.8)

## 2020-01-02 LAB — IRON, TOTAL/TOTAL IRON BINDING CAP: Iron: 18 ug/dL — ABNORMAL LOW (ref 45–160)

## 2020-01-02 LAB — TRANSFERRIN: Transferrin: 388 mg/dL — ABNORMAL HIGH (ref 188–341)

## 2020-01-02 LAB — FERRITIN: Ferritin: 3 ng/mL — ABNORMAL LOW (ref 16–288)

## 2020-01-04 ENCOUNTER — Telehealth: Payer: Self-pay | Admitting: Family Medicine

## 2020-01-04 NOTE — Telephone Encounter (Signed)
Rena,  Can you please call and triage.

## 2020-01-04 NOTE — Telephone Encounter (Signed)
Pt called wanting donna to call her.  She stated she is not feeling very well.  She is better than she was.  She stated she is a little light now and wanted to know if this is normal Best number [phone]

## 2020-01-04 NOTE — Telephone Encounter (Signed)
Added to mychart message.

## 2020-01-04 NOTE — Telephone Encounter (Signed)
Pt said she has been taking the iron pill since 01/01/20; pt eating red meat and is feeling better. Pt is still light headed on and off and does not see a pattern to the light headedness. Since 01/01/20 pt has been taking diltiazem 120 Mg daily. No H/A. Pt said the CP and SOB is not as bad because pts GERD is not as bad. Pt wants to know if any further instructions and should pt continue to monitor BP daily at home and pt is trying to build her iron up and wants to know if Dr Diona Browner thinks she still needs iron infusion. Pt request cb.

## 2020-01-05 NOTE — Telephone Encounter (Signed)
f °

## 2020-01-06 MED ORDER — FERROUS SULFATE 325 (65 FE) MG PO TBEC: 325.0000 mg | DELAYED_RELEASE_TABLET | Freq: Two times a day (BID) | ORAL | 3 refills | Status: DC

## 2020-01-06 NOTE — Telephone Encounter (Signed)
Patient called in stating she would like the iron medication sent in ASAP this morning as she still feels so weak. Please advise as she would like a call when sent in.

## 2020-01-06 NOTE — Addendum Note (Signed)
Addended by: [name] E on: 01/06/2020 08:15 AM   Modules accepted: Orders

## 2020-01-06 NOTE — Progress Notes (Signed)
Order(s) created erroneously. Erroneous order ID: [id number]  Order moved by: Darden Palmer  Order move date/time: 01/06/2020 11:37 AM  Source Patient: [id number]  Source Contact: 01/01/2020  Destination Patient: [id number]  Destination Contact: 08/26/2012

## 2020-01-10 ENCOUNTER — Emergency Department: Payer: BC Managed Care – PPO

## 2020-01-10 ENCOUNTER — Other Ambulatory Visit: Payer: Self-pay

## 2020-01-10 ENCOUNTER — Emergency Department
Admission: EM | Admit: 2020-01-10 | Discharge: 2020-01-10 | Disposition: A | Discharge status: Home / Self Care | Payer: BC Managed Care – PPO | Attending: Emergency Medicine | Admitting: Emergency Medicine

## 2020-01-10 ENCOUNTER — Encounter: Payer: Self-pay | Admitting: Emergency Medicine

## 2020-01-10 DIAGNOSIS — Z7951 Long term (current) use of inhaled steroids: Secondary | ICD-10-CM | POA: Insufficient documentation

## 2020-01-10 DIAGNOSIS — I10 Essential (primary) hypertension: Secondary | ICD-10-CM | POA: Diagnosis not present

## 2020-01-10 DIAGNOSIS — Z85828 Personal history of other malignant neoplasm of skin: Secondary | ICD-10-CM | POA: Insufficient documentation

## 2020-01-10 DIAGNOSIS — Z79899 Other long term (current) drug therapy: Secondary | ICD-10-CM | POA: Diagnosis not present

## 2020-01-10 DIAGNOSIS — E876 Hypokalemia: Secondary | ICD-10-CM | POA: Diagnosis not present

## 2020-01-10 DIAGNOSIS — J4530 Mild persistent asthma, uncomplicated: Secondary | ICD-10-CM | POA: Insufficient documentation

## 2020-01-10 DIAGNOSIS — R0602 Shortness of breath: Secondary | ICD-10-CM | POA: Diagnosis present

## 2020-01-10 LAB — COMPREHENSIVE METABOLIC PANEL
ALT: 14 U/L (ref 0–44)
AST: 18 U/L (ref 15–41)
Albumin: 4.2 g/dL (ref 3.5–5.0)
Alkaline Phosphatase: 91 U/L (ref 38–126)
Anion gap: 12 (ref 5–15)
BUN: 15 mg/dL (ref 8–23)
CO2: 29 mmol/L (ref 22–32)
Calcium: 9.5 mg/dL (ref 8.9–10.3)
Chloride: 98 mmol/L (ref 98–111)
Creatinine, Ser: 0.58 mg/dL (ref 0.44–1.00)
GFR calc Af Amer: >60 mL/min (ref >60)
GFR calc non Af Amer: >60 mL/min (ref >60)
Glucose, Bld: 118 mg/dL — ABNORMAL HIGH (ref 70–99)
Potassium: 2.7 mmol/L — CL (ref 3.5–5.1)
Sodium: 139 mmol/L (ref 135–145)
Total Bilirubin: 0.7 mg/dL (ref 0.3–1.2)
Total Protein: 7.7 g/dL (ref 6.5–8.1)

## 2020-01-10 LAB — IRON AND TIBC
Iron: 90 ug/dL (ref 28–170)
Saturation Ratios: 18 % (ref 10.4–31.8)
TIBC: 496 ug/dL — ABNORMAL HIGH (ref 250–450)
UIBC: 406 ug/dL

## 2020-01-10 LAB — CBC
HCT: 37.2 % (ref 36.0–46.0)
Hemoglobin: 12.2 g/dL (ref 12.0–15.0)
MCH: 25.3 pg — ABNORMAL LOW (ref 26.0–34.0)
MCHC: 32.8 g/dL (ref 30.0–36.0)
MCV: 77.2 fL — ABNORMAL LOW (ref 80.0–100.0)
Platelets: 333 10*3/uL (ref 150–400)
RBC: 4.82 MIL/uL (ref 3.87–5.11)
RDW: 16.5 % — ABNORMAL HIGH (ref 11.5–15.5)
WBC: 7.6 10*3/uL (ref 4.0–10.5)
nRBC: 0 % (ref 0.0–0.2)

## 2020-01-10 LAB — MAGNESIUM: Magnesium: 2.1 mg/dL (ref 1.7–2.4)

## 2020-01-10 LAB — LIPASE, BLOOD: Lipase: 49 U/L (ref 11–51)

## 2020-01-10 MED ORDER — SODIUM CHLORIDE 0.9% FLUSH: 3.0000 mL | Freq: Once | INTRAVENOUS | Status: DC

## 2020-01-10 MED ORDER — POTASSIUM CHLORIDE CRYS ER 20 MEQ PO TBCR: 20.0000 meq | EXTENDED_RELEASE_TABLET | Freq: Every day | ORAL | 0 refills | Status: DC

## 2020-01-10 MED ORDER — POTASSIUM CHLORIDE 10 MEQ/100ML IV SOLN
10.0000 meq | Freq: Once | INTRAVENOUS | Status: AC
  Administered 2020-01-10: 10 meq via INTRAVENOUS
  Filled 2020-01-10: qty 100

## 2020-01-10 MED ORDER — POTASSIUM CHLORIDE CRYS ER 20 MEQ PO TBCR
40.0000 meq | EXTENDED_RELEASE_TABLET | Freq: Once | ORAL | Status: AC
  Administered 2020-01-10: 40 meq via ORAL
  Filled 2020-01-10: qty 2

## 2020-01-10 NOTE — Discharge Instructions (Signed)
Please call and schedule a follow up with primary care or cardiology.  Return to the ER for symptoms that change or worsen if unable to schedule an appointment.

## 2020-01-10 NOTE — ED Triage Notes (Signed)
Pt presents to ED via POV with c/o SOB and dizziness when laying down. Pt states previously dx with hiatal hernia. Pt states when she is laying down her HR drops into the 50's. Pt states hx of "severe GERD", states takes 4 antacids/day, also hx of anemia and takes Iron supplements.

## 2020-01-10 NOTE — ED Provider Notes (Signed)
[facility] Emergency Department Provider Note ___________________________________________   First MD Initiated Contact with Patient 01/10/20 1604     (approximate)  I have reviewed the triage vital signs and the nursing notes.   HISTORY  Chief Complaint Shortness of Breath and Abdominal Pain  HPI Kimberly Patton is a 64 y.o. female who presents to the emergency department for treatment and evaluation of shortness of breath and dizziness with laying down.  She has had similar symptoms for several months that seem to have worsened over the past couple of days.  She states that she has a very large hiatal hernia and GERD based on recent EGD.  She states that when she lays down her heart rate decreases to the 50s.  She discussed this with her primary care provider told her not to come to the emergency department unless it dropped into the 40s.  With these episodes, she does not have any shortness of breath or chest pain.  She states that once she sits up the heart rate increases. She states that she lives alone and is scared to fall asleep. She has an appointment scheduled with surgery to discuss hiatal hernia repair.     Past Medical History:  Diagnosis Date   ALLERGIC RHINITIS 04/03/2007   ASTHMA, PERSISTENT, MODERATE 04/03/2007   Cancer ([facility])    Dyspnea on exertion    a. 11/2007 Echo: EF 60%.   GERD (gastroesophageal reflux disease)    Hemophilia carrier    Hypertension    Joint pain    HIPS / LEGS   Knee injury    RT   Meningioma ([facility])    Morbid obesity ([facility])    Paroxysmal SVT (supraventricular tachycardia) ([place])    a. 11/2011 48h Holter: RSR, rare PVC's, occas PAC's   Retinal tear of right eye 01/2016   Ventral hernia     Patient Active Problem List   Diagnosis Date Noted   Bradycardia 01/01/2020   Orthostatic hypotension 01/01/2020   Globus sensation 11/23/2019   Sore throat 10/22/2019   Elevated lipase 08/27/2019    Abdominal pain, epigastric 08/11/2019   Hypercholesteremia 06/09/2019   Neck swelling 02/14/2019   Ear pain, right 09/12/2018   Chronic neuropathic pain 07/31/2018   Thyroid nodule 04/15/2018   Skin lesion of scalp 09/06/2017   Hypokalemia 06/08/2017   Iron deficiency anemia 06/08/2017   Osteoporosis 12/04/2016   Osteoarthritis of knee 07/17/2016   Numbness and tingling of both feet 01/27/2016   Meningioma ([place]) 01/18/2016   Family history of aortic stenosis 01/20/2015   Status post laparoscopic sleeve gastrectomy Dec 2014 06/08/2013   Morbid obesity ([place]) 12/08/2012   HTN (hypertension) 10/11/2011   Varicose veins of lower extremities with other complications 34/74/2595   FIBROIDS, UTERUS 04/03/2007   Allergic rhinitis 04/03/2007   Asthma, mild intermittent 04/03/2007    Past Surgical History:  Procedure Laterality Date   APPENDECTOMY  1996   BREAST CYST EXCISION Right 1994   Benign   CESAREAN SECTION     x2   [place]  01/2016   Repair retinal tear   FOOT SURGERY  1995 / 1996   x2   KNEE ARTHROSCOPY W/ MENISCAL REPAIR  07/25/13   right knee Dr. Mardelle Matte   LAPAROSCOPIC GASTRIC SLEEVE RESECTION N/A 06/08/2013   Procedure: LAPAROSCOPIC GASTRIC SLEEVE RESECTION AND EXCISION OF SEBACEUS CYST FROM MID CHEST takedown of incarcerated ventral hernia and primary repair endoscopy;  Surgeon: [name],  MD;  Location: [facility];  Service: General;  Laterality: N/A;   moles  06/2013   removed 2 moles from under arm and  [place] Right 01/2016   [place]   SKIN CANCER EXCISION     TONSILLECTOMY     UPPER GI ENDOSCOPY  06/08/2013   Procedure: UPPER GI ENDOSCOPY;  Surgeon: [name], MD;  Location: [facility];  Service: General;;   US ECHOCARDIOGRAPHY  12/2011   WNL - EF 55-60%, mild MR, grade 1 diastolic dysnfiction (mild)    VENTRAL HERNIA REPAIR  2000    Prior to Admission medications   Medication Sig Start Date End Date Taking? Authorizing Provider  albuterol (PROAIR HFA) 108 (90 Base) MCG/ACT inhaler TAKE 2 PUFFS BY MOUTH EVERY 6 HOURS AS NEEDED 08/27/18   Bedsole, Amy E, MD  Calcium Citrate-Vitamin D (CITRACAL + D PO) Take 2 tablets by mouth daily.     [provider]  cefdinir (OMNICEF) 300 MG capsule Take 300 mg by mouth 2 (two) times daily. 11/30/19   [provider]  Cyanocobalamin (VITAMIN B-12 SL) Place 1 tablet under the tongue every morning.    [provider]  diltiazem (CARDIZEM CD) 120 MG 24 hr capsule Take 1 capsule (120 mg total) by mouth daily. 01/01/20   Bedsole, Amy E, MD  ELDERBERRY PO Take 1 tablet by mouth daily.    [provider]  etodolac (LODINE) 400 MG tablet Take 400 mg by mouth every 8 (eight) hours as needed. 11/05/19   [provider]  famotidine (PEPCID) 20 MG tablet Take by mouth. 12/17/19 12/16/20  [provider]  ferrous sulfate 325 (65 FE) MG EC tablet Take 1 tablet (325 mg total) by mouth 2 (two) times daily with a meal. 01/06/20   Bedsole, Amy E, MD  fluticasone (FLONASE) 50 MCG/ACT nasal spray SPRAY 2 SPRAYS INTO EACH NOSTRIL EVERY DAY 12/03/19   Bedsole, Amy E, MD  hydrochlorothiazide (HYDRODIURIL) 25 MG tablet TAKE 1 TABLET BY MOUTH EVERY DAY Patient taking differently: Take 25 mg by mouth daily.  12/03/19   Bedsole, Amy E, MD  Omega-3 Fatty Acids (FISH OIL) 1200 MG CAPS Take 1 capsule by mouth daily.    [provider]  OXTELLAR XR 600 MG TB24 Take 2 tablets by mouth at bedtime. 12/26/18   [provider]  pantoprazole (PROTONIX) 40 MG tablet TAKE 1 TABLET BY MOUTH TWICE A DAY Patient taking differently: Take 40 mg by mouth 2 (two) times daily.  11/20/19   Bedsole, Amy E, MD  potassium chloride SA (KLOR-CON) 20 MEQ tablet Take 1 tablet (20 mEq total) by mouth daily. 01/10/20   Tabor Denham B, FNP  traMADol  (ULTRAM) 50 MG tablet Take 1 tablet (50 mg total) by mouth every 8 (eight) hours as needed. 08/27/19   Zehr, Laban Emperor, PA-C  Turmeric 500 MG CAPS Take 1 capsule by mouth daily.    [provider]  vitamin C (ASCORBIC ACID) 500 MG tablet Take 1,000 mg by mouth daily.    [provider]    Allergies Wasp venom, Prednisone, and Sodium hypochlorite  Family History  Problem Relation Age of Onset   Breast cancer Mother    Diabetes Mother    Hypertension Mother    Kidney failure Mother    Diabetes Father    Hypertension Father    Diabetes  Other    Hypertension Other    Stroke Other    Hemophilia Other     Social History Social History   Tobacco Use   Smoking status: Never Smoker   Smokeless tobacco: Never Used   Tobacco comment: smoke at age 74-12  Substance Use Topics   Alcohol use: Yes    Alcohol/week: 0.0 standard drinks    Comment: Occasionally   Drug use: No    Review of Systems  Constitutional: No fever/chills Eyes: No visual changes. ENT: No sore throat. Cardiovascular: Denies chest pain. Respiratory: Denies shortness of breath. Gastrointestinal: No abdominal pain.  No nausea, no vomiting.  No diarrhea.  No constipation. Genitourinary: Negative for dysuria. Musculoskeletal: Negative for back pain. Skin: Negative for rash. Neurological: Negative for headaches, focal weakness or numbness. ____________________________________________   PHYSICAL EXAM:  VITAL SIGNS: ED Triage Vitals  Enc Vitals Group     BP 01/10/20 1404 (!) 134/73     Pulse Rate 01/10/20 1404 93     Resp 01/10/20 1404 20     Temp 01/10/20 1404 98.2 F (36.8 C)     Temp Source 01/10/20 1404 Oral     SpO2 01/10/20 1404 95 %     Weight 01/10/20 1402 (!) 245 lb (111.1 kg)     Height 01/10/20 1402 5' 5.5" (1.664 m)     Head Circumference --      Peak Flow --      Pain Score 01/10/20 1402 8     Pain Loc --      Pain Edu? --      Excl. in [place]? --      Constitutional: Alert and oriented. Well appearing and in no acute distress. Eyes: Conjunctivae are normal. Head: Atraumatic. Nose: No congestion/rhinnorhea. Mouth/Throat: Mucous membranes are moist. Oropharynx non-erythematous. Neck: No stridor.   Cardiovascular: Normal rate, regular rhythm. Grossly normal heart sounds.  Good peripheral circulation. Respiratory: Normal respiratory effort.  No retractions. Lungs CTAB. Gastrointestinal: Soft and nontender. No distention. No abdominal bruits. Musculoskeletal: No lower extremity tenderness nor edema.  No joint effusions. Neurologic:  Normal speech and language. No gross focal neurologic deficits are appreciated. No gait instability. Skin:  Skin is warm, dry and intact. No rash noted. Psychiatric: Mood and affect are normal. Speech and behavior are normal.  ____________________________________________   LABS (all labs ordered are listed, but only abnormal results are displayed)  Labs Reviewed  COMPREHENSIVE METABOLIC PANEL - Abnormal; Notable for the following components:      Result Value   Potassium 2.7 (*)    Glucose, Bld 118 (*)    All other components within normal limits  CBC - Abnormal; Notable for the following components:   MCV 77.2 (*)    MCH 25.3 (*)    RDW 16.5 (*)    All other components within normal limits  IRON AND TIBC - Abnormal; Notable for the following components:   TIBC 496 (*)    All other components within normal limits  LIPASE, BLOOD  MAGNESIUM  URINALYSIS, COMPLETE (UACMP) WITH MICROSCOPIC   ____________________________________________  EKG  Rate of 91, PR interval 0.17, QRS duration 0.10, QTC 0.362, left axis deviation with an incomplete right bundle branch block.  No ST depression or elevation. ____________________________________________  RADIOLOGY  ED MD interpretation: No active cardiopulmonary disease, no evidence of pneumonia or pulmonary edema  Official radiology report(s): DG Chest  2 View  Result Date: 01/10/2020 CLINICAL DATA:  Shortness of breath, dizziness EXAM: CHEST - 2  VIEW COMPARISON:  Chest x-ray dated 12/11/2019 chest x-ray dated 05/05/2017. FINDINGS: Heart size and mediastinal contours are stable. Lungs are clear. No pleural effusion or pneumothorax. Mild degenerative spondylosis of the thoracic spine. No acute or suspicious osseous finding. IMPRESSION: No active cardiopulmonary disease. No evidence of pneumonia or pulmonary edema. Electronically Signed   By: Franki Cabot M.D.   On: 01/10/2020 14:53    ____________________________________________   PROCEDURES  Procedure(s) performed (including Critical Care):  Procedures   ____________________________________________   INITIAL IMPRESSION / ASSESSMENT AND PLAN / ED COURSE  As part of my medical decision making, I reviewed the following data within the [facility] office visit note, recent EGD report.        64 year old female presenting to the emergency department for treatment and evaluation of chronic epigastric pain and bradycardia at night.  Patient reports that this causes her significant anxiety because she lives alone.  She states that she feels a little dizzy then looks at her iWatch and sees that it is running in the 50s.  This causes her anxiety to get worse.  She has discussed this with her primary care provider who has not made any recommendations that have relieved her symptoms.  During the day, she does not have bradycardic episodes that she is aware of.  She has not been evaluated by cardiology in the recent years.  She states that she also has concerns about her iron level being low.  She is taking multiple iron tablets daily as well as 4 different medications for treatment of GERD and her hiatal hernia.   Lab results do indicate that she has a hypokalemia 2.7.  IV and oral potassium ordered.  We will also check her magnesium and iron levels since she is on significant  amounts of PPI and has a history of iron deficiency anemia.  Magnesium level is normal, iron level is normal.  Plan will be to discharge her home to follow-up with her primary care provider.  She is to discuss follow-up/referral to cardiology for bradycardia.  She has not been bradycardic while she has been here.  Patient feels reassured by her lab results, normal EKG, and normal chest x-ray.      ____________________________________________   FINAL CLINICAL IMPRESSION(S) / ED DIAGNOSES  Final diagnoses:  Hypokalemia     ED Discharge Orders         Ordered    potassium chloride SA (KLOR-CON) 20 MEQ tablet  Daily     Discontinue  Reprint     01/10/20 1934           Note:  This document was prepared using Dragon voice recognition software and may include unintentional dictation errors.    Victorino Dike, FNP 01/10/20 1949    Lavonia Drafts, MD 01/10/20 2100

## 2020-01-12 ENCOUNTER — Ambulatory Visit
Admission: RE | Admit: 2020-01-12 | Discharge: 2020-01-12 | Disposition: A | Discharge status: Home / Self Care | Payer: BC Managed Care – PPO | Source: Ambulatory Visit | Attending: Family Medicine | Admitting: Family Medicine

## 2020-01-12 ENCOUNTER — Other Ambulatory Visit: Payer: BC Managed Care – PPO

## 2020-01-13 ENCOUNTER — Encounter: Payer: Self-pay | Admitting: Family Medicine

## 2020-01-13 NOTE — Addendum Note (Signed)
Addended by: [name] E on: 01/13/2020 03:59 PM   Modules accepted: Orders

## 2020-01-14 ENCOUNTER — Encounter: Payer: Self-pay | Admitting: Family Medicine

## 2020-01-14 ENCOUNTER — Telehealth: Payer: Self-pay

## 2020-01-14 NOTE — Telephone Encounter (Signed)
[place] [facility] - Client TELEPHONE ADVICE RECORD [facility]urse Patient Name: Kimberly Patton Gender: Female DOB: 12/03/1955 Age: 64 Y 2 M 17 D Return Phone Number: [phone] (Primary) Address: City/State/Zip: [place] [address] Client [facility] - Client Client Site [place] - Day Physician [name] - MD Contact Type Call Who Is Calling Patient / Member / Family / Caregiver Call Type Triage / Clinical Relationship To Patient Self Return Phone Number [phone] (Primary) Chief Complaint Chest Pain (non urgent symptoms) Reason for Call Symptomatic / Request for [place] states she has been experiencing diarrhea, nausea and abnormal feeling in her chest. Caller would like to know if she is taken in too much potassium. Translation No Nurse Assessment Nurse: Vallery Sa, RN, Cathy Date/Time (Eastern Time): 01/14/2020 2:13:49 PM Confirm and document reason for call. If symptomatic, describe symptoms. ---Kimberly Patton states that she is not having severe breathing difficulty or blueness around her lips. She developed pressure in her chest with nausea yesterday (current pain rated as a 4 on the 1 to 10 scale). She is not feeling skip heart rates or a fast heart rate. No fever. Alert and responsive. Has the patient had close contact with a person known or suspected to have the novel coronavirus illness OR traveled / lives in area with major community spread (including international travel) in the last 14 days from the onset of symptoms? * If Asymptomatic, screen for exposure and travel within the last 14 days. ---No Does the patient have any new or worsening symptoms? ---Yes Will a triage be completed? ---Yes Related visit to physician within the last 2 weeks? ---Yes Does the PT have any chronic conditions? (i.e. diabetes, asthma, this includes High risk factors for pregnancy,  etc.) ---Yes List chronic conditions. ---Low Potassium, GERD, Hiatal Hernia Is this a behavioral health or substance abuse call? ---No Guidelines Guideline Title Affirmed Question Affirmed Notes Nurse Date/Time Eilene Ghazi Time) Chest Pain [1] Chest pain lasts > 5 minutes AND [2] age > 85 Tr[place]ull, RN, Tye [place] 01/14/2020 2:17:38 PM PLEASE NOTE: All timestamps contained within this report are represented as [place] Standard Time. CONFIDENTIALTY NOTICE: This fax transmission is intended only for the addressee. It contains information that is legally privileged, confidential or otherwise protected from use or disclosure. If you are not the intended recipient, you are strictly prohibited from reviewing, disclosing, copying using or disseminating any of this information or taking any action in reliance on or regarding this information. If you have received this fax in error, please notify us immediately by telephone so that we can arrange for its return to [place]. Phone: [phone], Toll-Free: [phone], Fax: [phone] Page: 2 of 2 Call Id: [id number] [place]. Time Eilene Ghazi Time) Disposition Final User 01/14/2020 2:24:59 PM [place], RN, Tye [place] Reason: Kimberly Patton declined the Call 911 disposition. Reinforced the Call 911 disposition. 01/14/2020 2:39:11 PM Call Completed Vallery Sa RN, Tye [place] 01/14/2020 2:19:46 PM Call EMS 911 Now Yes Trumbull, RN, Rosey [place] Disagree/Comply Disagree Caller Understands Yes PreDisposition Go to ED Care Advice Given Per Guideline CALL EMS 911 NOW: * Immediate medical attention is needed. You need to hang up and call 911 (or an ambulance). * Triager Discretion: I'll call you back in a few minutes to be sure you were able to reach them. CARE ADVICE given per Chest Pain (Adult) guideline. Comments User: Berton Mount, RN Date/Time Eilene Ghazi Time): 01/14/2020 2:38:44 PM Transferred to [facility] via the office backline. Referrals  GO TO FACILITY  REFUSED Warm transfer to backline

## 2020-01-14 NOTE — Telephone Encounter (Signed)
She can stop potassium .. take no more today. We will check tomorrow as scheduled.  If she feels she needs to have checked tonight or if symptoms worsening.. she can go to urgent care  now for evaluation.

## 2020-01-14 NOTE — Telephone Encounter (Signed)
Left message for Ms. Jarnigan to return my call. 

## 2020-01-14 NOTE — Telephone Encounter (Signed)
Access nurse, Juliann Pulse, contacted the office reporting pt called c/o increased chest pressure, nausea, and bilateral chronic shoulder pain since yesterday. Call was transferred to this nurse because their protocols said pt should call 911 now and pt refused.. Pt reports since she has been waiting for the call to be transferred she hs been drinking hot water which makes her burp and her chest tightness has gotten better. She thinks the chest tightness is from gas caused by K that was prescribed in the ER for hypokalemia. Her current BP is 140/71 and O2 sat is 98%. She reports the chest tightness started yesterday and she has felt full of gas and it has been building up in her chest since then. She started taking the K on Sunday. She contacted her GI physician, Dr. Hassell Done, who explained with her GERD and gas buildup this is what is causing her chest tightness. Pt reports she did not do well on IV K in the hospital. It was given undiluted and at a high rate at first and she immediately could not move her L arm and she immediately had chest pain. Once they diluted it and slowed the rate she was ok. They were also giving her oral K at that time. They only prescribed K for 14 days. Shoulder pain is reported as "been there a long time, and I need to make myself sit up." One other observation the ER made was the pt taking iron and that the high amount of omeprazole she is taking is causing her to lose iron. She is wondering if this is true and if she should stop taking the omeprazole. Advised a msg would be sent to PCP concerning the med. Pt also wants to know if she needs to continue the K. She said she thinks her K level is fine now and she does not know if she can take all of this gas. Advised PCP may want pt in for an OV or labs. Pt also reports she does have a lot of anxiety about her health and everything going on and she tries not to worry but she has a hard time not worrying.  Pt denies any other symptoms and  reports by the end of the call that the chest tightness is much better although she still has the pain from the hiatal hernia. Pt denies any other symptoms currently. Advised pt if any symptoms worsen or she developed any new symptoms to go to the ER. Advised a msg would be sent to the PCP and this office would f/u. Pt verbalized understanding.   Pt also wanted PCP to know her recent biopsy came back negative.

## 2020-01-14 NOTE — Telephone Encounter (Signed)
Any PPI, pantoprazole included, can decrease iron absorption. Should continue these listed meds. Thank you for clarifing correct meds.

## 2020-01-14 NOTE — Telephone Encounter (Signed)
Call   Omeprazole can contribute to  difficulty absorbing iron but her iron test in ER has already increased significantly. Given she has GI issues, hiatal hernia, I would recommend continuing omeprazole for now despite this.  Have her make lab appt tommorow for K to see if we can decrease the potassium.

## 2020-01-14 NOTE — Telephone Encounter (Signed)
Patient notified as instructed by telephone. Lab appointment scheduled for 01/15/2020 at 8:25 am to check potassium level.  I do not see omeprazole on her current medication list. Pepcid and Protonix is on current med list.  She states she feels like she is having a heart attack every day.  Patient is very concerned about taking all these stomach medications.  She also wants to make sure all the doctors are checking her medication to make sure what they give her will not interact.  She just wants this to go away.  FYI to Dr. Randa Spike.

## 2020-01-14 NOTE — Telephone Encounter (Signed)
Kimberly Patton notified as instructed by telephone.  She has already taken the potassium today so she will hold tomorrows dose.  Her current stomach medications are famotidine, pantoprazole and they just added sucralfate 1gm.  Patient is not taking omeprazole.

## 2020-01-15 ENCOUNTER — Telehealth: Payer: Self-pay | Admitting: Family Medicine

## 2020-01-15 ENCOUNTER — Other Ambulatory Visit: Payer: Self-pay

## 2020-01-15 ENCOUNTER — Other Ambulatory Visit (INDEPENDENT_AMBULATORY_CARE_PROVIDER_SITE_OTHER): Payer: BC Managed Care – PPO

## 2020-01-15 DIAGNOSIS — E876 Hypokalemia: Secondary | ICD-10-CM

## 2020-01-15 LAB — BASIC METABOLIC PANEL
BUN: 21 mg/dL (ref 6–23)
CO2: 32 mEq/L (ref 19–32)
Calcium: 9.7 mg/dL (ref 8.4–10.5)
Chloride: 101 mEq/L (ref 96–112)
Creatinine, Ser: 0.53 mg/dL (ref 0.40–1.20)
GFR: 116.06 mL/min (ref >60.00)
Glucose, Bld: 89 mg/dL (ref 70–99)
Potassium: 3.6 mEq/L (ref 3.5–5.1)
Sodium: 139 mEq/L (ref 135–145)

## 2020-01-15 NOTE — Telephone Encounter (Signed)
Kimberly Patton was instructed in our conversation yesterday to continue all stomach medications as prescribed.  Awaiting her K+ results from today.

## 2020-01-15 NOTE — Telephone Encounter (Signed)
-----   Message from Ellamae Sia sent at 01/15/2020  7:28 AM EDT ----- Regarding: lab orders for today Lab orders for K+? thanks

## 2020-01-17 ENCOUNTER — Emergency Department
Admission: EM | Admit: 2020-01-17 | Discharge: 2020-01-18 | Disposition: A | Discharge status: Home / Self Care | Payer: BC Managed Care – PPO | Attending: Emergency Medicine | Admitting: Emergency Medicine

## 2020-01-17 ENCOUNTER — Other Ambulatory Visit: Payer: Self-pay

## 2020-01-17 DIAGNOSIS — R0789 Other chest pain: Secondary | ICD-10-CM | POA: Diagnosis not present

## 2020-01-17 DIAGNOSIS — Z5321 Procedure and treatment not carried out due to patient leaving prior to being seen by health care provider: Secondary | ICD-10-CM | POA: Diagnosis not present

## 2020-01-17 DIAGNOSIS — M79602 Pain in left arm: Secondary | ICD-10-CM | POA: Diagnosis not present

## 2020-01-17 NOTE — ED Triage Notes (Signed)
Pt with left sided chest pain "severe" per pt. Pt states she feels intermittent shob. Pt able to speak in full sentences. Pt complains of left arm pain as well.

## 2020-01-18 ENCOUNTER — Encounter: Payer: Self-pay | Admitting: Cardiology

## 2020-01-18 ENCOUNTER — Emergency Department: Payer: BC Managed Care – PPO

## 2020-01-18 ENCOUNTER — Ambulatory Visit: Payer: BC Managed Care – PPO | Admitting: Cardiology

## 2020-01-18 ENCOUNTER — Encounter: Payer: Self-pay | Admitting: Radiology

## 2020-01-18 VITALS — BP 124/86 | HR 76 | Ht 66.0 in | Wt 245.0 lb

## 2020-01-18 DIAGNOSIS — I10 Essential (primary) hypertension: Secondary | ICD-10-CM | POA: Diagnosis not present

## 2020-01-18 DIAGNOSIS — E876 Hypokalemia: Secondary | ICD-10-CM | POA: Diagnosis not present

## 2020-01-18 DIAGNOSIS — R072 Precordial pain: Secondary | ICD-10-CM | POA: Diagnosis not present

## 2020-01-18 DIAGNOSIS — R079 Chest pain, unspecified: Secondary | ICD-10-CM | POA: Diagnosis not present

## 2020-01-18 DIAGNOSIS — I471 Supraventricular tachycardia: Secondary | ICD-10-CM

## 2020-01-18 LAB — BASIC METABOLIC PANEL
Anion gap: 12 (ref 5–15)
BUN: 17 mg/dL (ref 8–23)
CO2: 28 mmol/L (ref 22–32)
Calcium: 9.1 mg/dL (ref 8.9–10.3)
Chloride: 99 mmol/L (ref 98–111)
Creatinine, Ser: 0.51 mg/dL (ref 0.44–1.00)
GFR calc Af Amer: >60 mL/min (ref >60)
GFR calc non Af Amer: >60 mL/min (ref >60)
Glucose, Bld: 104 mg/dL — ABNORMAL HIGH (ref 70–99)
Potassium: 3.2 mmol/L — ABNORMAL LOW (ref 3.5–5.1)
Sodium: 139 mmol/L (ref 135–145)

## 2020-01-18 LAB — CBC
HCT: 37.6 % (ref 36.0–46.0)
Hemoglobin: 12.4 g/dL (ref 12.0–15.0)
MCH: 26 pg (ref 26.0–34.0)
MCHC: 33 g/dL (ref 30.0–36.0)
MCV: 78.8 fL — ABNORMAL LOW (ref 80.0–100.0)
Platelets: 282 10*3/uL (ref 150–400)
RBC: 4.77 MIL/uL (ref 3.87–5.11)
RDW: 17.6 % — ABNORMAL HIGH (ref 11.5–15.5)
WBC: 8.2 10*3/uL (ref 4.0–10.5)
nRBC: 0 % (ref 0.0–0.2)

## 2020-01-18 LAB — TROPONIN I (HIGH SENSITIVITY)
Troponin I (High Sensitivity): 4 ng/L (ref <18)
Troponin I (High Sensitivity): 4 ng/L (ref <18)

## 2020-01-18 MED ORDER — POTASSIUM CHLORIDE CRYS ER 20 MEQ PO TBCR: 40.0000 meq | EXTENDED_RELEASE_TABLET | Freq: Once | ORAL | Status: DC

## 2020-01-18 MED ORDER — NITROGLYCERIN 0.4 MG SL SUBL
0.4000 mg | SUBLINGUAL_TABLET | SUBLINGUAL | 0 refills | Status: DC | PRN
Start: 2020-01-18 — End: 2020-04-20

## 2020-01-18 MED ORDER — LISINOPRIL 10 MG PO TABS
10.0000 mg | ORAL_TABLET | Freq: Every day | ORAL | 3 refills | Status: DC
Start: 2020-01-18 — End: 2020-02-05

## 2020-01-18 NOTE — Progress Notes (Signed)
Cardiology Office Note:    Date:  01/18/2020   ID:  Kimberly Patton, DOB 08/05/1955, MRN [id number]  PCP:  Jinny Sanders, MD  [facility] Cardiologist:  [name], MD  [place] Electrophysiologist:  None   Referring MD: Jinny Sanders, MD   Chief Complaint  Patient presents with  . New Patient (Initial Visit)    Ref by Dr. Diona Browner for bradycardia and decrease potassium level. Meds reviewed by the pt. verbally. Pt. c/o chest pain on the right side, left shoulder pain and shortness of breath.    Kimberly Patton is a 64 y.o. female who is being seen today for the evaluation of chest pain at the request of Diona Browner, Amy E, MD.   History of Present Illness:    Kimberly Patton is a 64 y.o. female with a hx of hypertension, hiatal hernia, GERD, iron deficiency anemia, SVT on diltiazem presenting due to chest pain.  Patient states having symptoms of chest pain over the past several months.  Symptoms are sometimes associated with exertion.  She states symptoms are not similar to her reflux.  She also has a history of rib dislocation with tenderness of her chest.  She states the new chest pain is different and pressure-like usually occurs when she goes to sleep.  Symptoms lasted couple of minutes, and has prompted ED visits.  She also has a history of hypertension, takes HCTZ, and has noticed hypokalemia on recent blood work.  Potassium replacements have caused some irritation.  A hiatal hernia surgery is being planned in the near future.  Previously seen by cardiology for tachycardia and palpitations.  Diagnosed with SVT in the past started on calcium channel blockers.  Work-up with echocardiogram showed normal systolic function, EF 55 to 60% and Lexiscan stress test in 2013  was negative for ischemia.  She was noted to have bradycardia, diltiazem dose was reduced.   Past Medical History:  Diagnosis Date  . ALLERGIC RHINITIS 04/03/2007  . ASTHMA, PERSISTENT, MODERATE 04/03/2007   . Cancer ([place])   . Dyspnea on exertion    a. 11/2007 Echo: EF 60%.  Marland Kitchen GERD (gastroesophageal reflux disease)   . 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 Hypertension   . Joint pain    HIPS / LEGS  . Knee injury    RT  . Meningioma ([place])   . Morbid obesity ([place])   . Paroxysmal SVT (supraventricular tachycardia) ([place])    a. 11/2011 48h Holter: RSR, rare PVC's, occas PAC's  . Retinal tear of right eye 01/2016  . Ventral hernia     Past Surgical History:  Procedure Laterality Date  . APPENDECTOMY  1996  . BREAST CYST EXCISION Right 1994   Benign  . CESAREAN SECTION     x2  . CHOLECYSTECTOMY  1995  . EYE SURGERY  01/2016   Repair retinal tear  . [place] / 1996   x2  . KNEE ARTHROSCOPY W/ MENISCAL REPAIR  07/25/13   right knee Dr. Mardelle Matte  . LAPAROSCOPIC GASTRIC SLEEVE RESECTION N/A 06/08/2013   Procedure: LAPAROSCOPIC GASTRIC SLEEVE RESECTION AND EXCISION OF SEBACEUS CYST FROM MID CHEST takedown of incarcerated ventral hernia and primary repair endoscopy;  Surgeon: [name], MD;  Location: [facility];  Service: General;  Laterality: N/A;  . moles  06/2013   removed 2 moles from under arm and  lowback  . OVARIAN CYST REMOVAL  1970  . PILONIDAL CYST EXCISION  1975  . RETINAL  TEAR REPAIR CRYOTHERAPY Right 01/2016   [place]  . SKIN CANCER EXCISION    . TONSILLECTOMY    . UPPER GI ENDOSCOPY  06/08/2013   Procedure: UPPER GI ENDOSCOPY;  Surgeon: [name], MD;  Location: [facility];  Service: General;;  . US ECHOCARDIOGRAPHY  12/2011   WNL - EF 55-60%, mild MR, grade 1 diastolic dysnfiction (mild)  . VENTRAL HERNIA REPAIR  2000    Current Medications: Current Meds  Medication Sig  . albuterol (PROAIR HFA) 108 (90 Base) MCG/ACT inhaler TAKE 2 PUFFS BY MOUTH EVERY 6 HOURS AS NEEDED  . Calcium Citrate-Vitamin D (CITRACAL + D PO) Take 2 tablets by mouth daily.   . Cyanocobalamin (VITAMIN B-12 SL) Place 1 tablet under the tongue every morning.  . diltiazem (CARDIZEM CD) 120 MG 24  hr capsule Take 1 capsule (120 mg total) by mouth daily.  Marland Kitchen ELDERBERRY PO Take 1 tablet by mouth daily.  . famotidine (PEPCID) 20 MG tablet Take by mouth.  . ferrous sulfate 325 (65 FE) MG EC tablet Take 1 tablet (325 mg total) by mouth 2 (two) times daily with a meal.  . fluticasone (FLONASE) 50 MCG/ACT nasal spray SPRAY 2 SPRAYS INTO EACH NOSTRIL EVERY DAY  . Omega-3 Fatty Acids (FISH OIL) 1200 MG CAPS Take 1 capsule by mouth daily.  . pantoprazole (PROTONIX) 40 MG tablet TAKE 1 TABLET BY MOUTH TWICE A DAY (Patient taking differently: Take 40 mg by mouth 2 (two) times daily. )  . potassium chloride SA (KLOR-CON) 20 MEQ tablet Take 1 tablet (20 mEq total) by mouth daily.  . sucralfate (CARAFATE) 1 GM/10ML suspension Take 1 g by mouth 2 (two) times daily.  . traMADol (ULTRAM) 50 MG tablet Take 1 tablet (50 mg total) by mouth every 8 (eight) hours as needed.  . Turmeric 500 MG CAPS Take 1 capsule by mouth daily.  . vitamin C (ASCORBIC ACID) 500 MG tablet Take 1,000 mg by mouth daily.  . [DISCONTINUED] hydrochlorothiazide (HYDRODIURIL) 25 MG tablet TAKE 1 TABLET BY MOUTH EVERY DAY (Patient taking differently: Take 25 mg by mouth daily. )   Current Facility-Administered Medications for the 01/18/20 encounter (Office Visit) with [name], MD  Medication  . methylPREDNISolone acetate (DEPO-MEDROL) injection 80 mg     Allergies:   Wasp venom, Prednisone, and Sodium hypochlorite   Social History   Socioeconomic History  . Marital status: Single    Spouse name: Not on file  . Number of children: 2  . Years of education: Bachelors  . Highest education level: Not on file  Occupational History  . Occupation: Product manager: [employer]  Tobacco Use  . Smoking status: Never Smoker  . Smokeless tobacco: Never Used  . Tobacco comment: smoke at age [age]  Substance and Sexual Activity  . Alcohol use: Yes    Alcohol/week: 0.0 standard drinks    Comment: Occasionally    . Drug use: No  . Sexual activity: Not on file  Other Topics Concern  . Not on file  Social History Narrative   Lives at home alone.   Right-handed.   Occasional caffeine use.   Social Determinants of Health   Financial Resource Strain:   . Difficulty of Paying Living Expenses:   Food Insecurity:   . Worried About Charity fundraiser in the Last Year:   . Arboriculturist in the Last Year:   Transportation Needs:   . Lack of Transportation (  Medical):   Marland Kitchen Lack of Transportation (Non-Medical):   Physical Activity:   . Days of Exercise per Week:   . Minutes of Exercise per Session:   Stress:   . Feeling of Stress :   Social Connections:   . Frequency of Communication with Friends and Family:   . Frequency of Social Gatherings with Friends and Family:   . Attends Religious Services:   . Active Member of Clubs or Organizations:   . Attends Archivist Meetings:   Marland Kitchen Marital Status:      Family History: The patient's family history includes Breast cancer in her mother; Diabetes in her father, mother, and another family member; Hemophilia in an other family member; Hypertension in her father, mother, and another family member; Kidney failure in her mother; Stroke in an other family member.  ROS:   Please see the history of present illness.     All other systems reviewed and are negative.  EKGs/Labs/Other Studies Reviewed:    The following studies were reviewed today:   EKG:  EKG is  ordered today.  The ekg ordered today demonstrates normal sinus rhythm with  Recent Labs: 11/23/2019: TSH 0.66 01/10/2020: ALT 14; Magnesium 2.1 01/18/2020: BUN 17; Creatinine, Ser 0.51; Hemoglobin 12.4; Platelets 282; Potassium 3.2; Sodium 139  Recent Lipid Panel    Component Value Date/Time   CHOL 209 (H) 06/02/2019 0804   TRIG 111.0 06/02/2019 0804   HDL 63.10 06/02/2019 0804   CHOLHDL 3 06/02/2019 0804   VLDL 22.2 06/02/2019 0804   LDLCALC 124 (H) 06/02/2019 0804    Physical  Exam:    VS:  BP 124/86 (BP Location: Left Arm, Patient Position: Sitting, Cuff Size: Normal)   Pulse 76   Ht 5\' 6"  (1.676 m)   Wt 245 lb (111.1 kg)   SpO2 98%   BMI 39.54 kg/m     Wt Readings from Last 3 Encounters:  01/18/20 245 lb (111.1 kg)  01/10/20 (!) 245 lb (111.1 kg)  01/01/20 (!) 245 lb (111.1 kg)     GEN:  Well nourished, well developed in no acute distress, obese HEENT: Normal NECK: No JVD; No carotid bruits LYMPHATICS: No lymphadenopathy CARDIAC: RRR, no murmurs, rubs, gallops RESPIRATORY:  Clear to auscultation without rales, wheezing or rhonchi  ABDOMEN: Soft, non-tender, non-distended MUSCULOSKELETAL:  No edema; tenderness on palpation of right and left chest noted. SKIN: Warm and dry NEUROLOGIC:  Alert and oriented x 3 PSYCHIATRIC:  Normal affect   ASSESSMENT:    1. Chest pain of uncertain etiology   2. Essential hypertension   3. Hypokalemia   4. Paroxysmal SVT (supraventricular tachycardia) ([place])   5. Precordial pain    PLAN:    In order of problems listed above:  1. Patient with atypical chest pain.  She has risk factors of obesity, age, hypertension.  Will evaluate patient with echocardiogram and Lexiscan Myoview for presence of ischemia.  Also this will help her stratify patient as she is planning a hiatal hernia surgery soon. 2. Patient with history of hypertension, takes HCTZ, last lab work showing hypokalemia.  Stop HCTZ, start lisinopril 10 daily, continue diltiazem 3. Patient with history of hypokalemia requiring potassium supplements.  Stop HCTZ, start lisinopril 10 mg daily.  Check BMP when patient comes for echocardiogram 4. History of paroxysmal SVT, patient noted to be bradycardic, I agree with decreasing dose of diltiazem for now.  Current EKG shows normal sinus rhythm with heart rate 76  Follow-up after echocardiogram and  Lexiscan.  Total encounter time more than 80 minutes  Greater than 50% was spent in counseling and coordination of  care with the patient   This note was generated in part or whole with voice recognition software. Voice recognition is usually quite accurate but there are transcription errors that can and very often do occur. I apologize for any typographical errors that were not detected and corrected.  Medication Adjustments/Labs and Tests Ordered: Current medicines are reviewed at length with the patient today.  Concerns regarding medicines are outlined above.  Orders Placed This Encounter  Procedures  . NM Myocar Multi W/Spect W/Wall Motion / EF  . Basic metabolic panel  . EKG 12-Lead  . ECHOCARDIOGRAM COMPLETE   Meds ordered this encounter  Medications  . lisinopril (ZESTRIL) 10 MG tablet    Sig: Take 1 tablet (10 mg total) by mouth daily.    Dispense:  30 tablet    Refill:  3  . nitroGLYCERIN (NITROSTAT) 0.4 MG SL tablet    Sig: Place 1 tablet (0.4 mg total) under the tongue every 5 (five) minutes as needed for chest pain.    Dispense:  30 tablet    Refill:  0    Patient Instructions  Medication Instructions:   Your physician has recommended you make the following change in your medication:   1.  STOP your hydrochlorothiazide (HYDRODIURIL). 2.  START lisinopril (ZESTRIL) 10 MG tablet: Take 1 tablet (10 mg total) by mouth daily. 3.  START nitroGLYCERIN (NITROSTAT) 0.4 MG SL tablet: Place 1 tablet (0.4 mg total) under the tongue every 5 (five) minutes as needed for chest pain for a max dose of 3X.  *If you need a refill on your cardiac medications before your next appointment, please call your pharmacy*   Lab Work: Your physician recommends that you return for lab work in: When you come in for your Echo, we will draw a BMP lab.  If you have labs (blood work) drawn today and your tests are completely normal, you will receive your results only by: Marland Kitchen MyChart Message (if you have MyChart) OR . A paper copy in the mail If you have any lab test that is abnormal or we need to change your  treatment, we will call you to review the results.   Testing/Procedures:  1.  Your physician has requested that you have an echocardiogram. Echocardiography is a painless test that uses sound waves to create images of your heart. It provides your doctor with information about the size and shape of your heart and how well your heart's chambers and valves are working. This procedure takes approximately one hour. There are no restrictions for this procedure.  2.  Your physician has requested that you have a lexiscan myoview.     [place]  Your caregiver has ordered a Stress Test with nuclear imaging. The purpose of this test is to evaluate the blood supply to your heart muscle. This procedure is referred to as a "Non-Invasive Stress Test." This is because other than having an IV started in your vein, nothing is inserted or "invades" your body. Cardiac stress tests are done to find areas of poor blood flow to the heart by determining the extent of coronary artery disease (CAD). Some patients exercise on a treadmill, which naturally increases the blood flow to your heart, while others who are  unable to walk on a treadmill due to physical limitations have a pharmacologic/chemical stress agent called Lexiscan . This medicine will mimic  walking on a treadmill by temporarily increasing your coronary blood flow.   Please note: these test may take anywhere between 2-4 hours to complete   PLEASE REPORT TO [facility] AT THE FIRST DESK WILL DIRECT YOU WHERE TO GO  Date of Procedure:_____________________________________  Arrival Time for Procedure:______________________________   PLEASE NOTIFY THE OFFICE AT LEAST 24 HOURS IN ADVANCE IF YOU ARE UNABLE TO KEEP YOUR APPOINTMENT.  [phone] AND  PLEASE NOTIFY NUCLEAR MEDICINE AT [facility] AT LEAST 24 HOURS IN ADVANCE IF YOU ARE UNABLE TO KEEP YOUR APPOINTMENT. [phone]  How to prepare for your Myoview test:  1. Do  not eat or drink after midnight 2. No caffeine for 24 hours prior to test 3. No smoking 24 hours prior to test. 4. Your medication may be taken with water.  If your doctor stopped a medication because of this test, do not take that medication. 5. Ladies, please do not wear dresses.  Skirts or pants are appropriate. Please wear a short sleeve shirt. 6. No perfume, cologne or lotion. 7. Wear comfortable walking shoes. No heels!        Follow-Up: At [facility], you and your health needs are our priority.  As part of our continuing mission to provide you with exceptional heart care, we have created designated Provider Care Teams.  These Care Teams include your primary Cardiologist (physician) and Advanced Practice Providers (APPs -  Physician Assistants and Nurse Practitioners) who all work together to provide you with the care you need, when you need it.  We recommend signing up for the patient portal called "MyChart".  Sign up information is provided on this After Visit Summary.  MyChart is used to connect with patients for Virtual Visits (Telemedicine).  Patients are able to view lab/test results, encounter notes, upcoming appointments, etc.  Non-urgent messages can be sent to your provider as well.   To learn more about what you can do with MyChart, go to [email or web address].    Your next appointment:   Follow up after Myoview and Echo   The format for your next appointment:   In Person  Provider:   [name], MD   Other Instructions   Echocardiogram An echocardiogram is a procedure that uses painless sound waves (ultrasound) to produce an image of the heart. Images from an echocardiogram can provide important information about:  Signs of coronary artery disease (CAD).  Aneurysm detection. An aneurysm is a weak or damaged part of an artery wall that bulges out from the normal force of blood pumping through the body.  Heart size and shape. Changes in the size or  shape of the heart can be associated with certain conditions, including heart failure, aneurysm, and CAD.  Heart muscle function.  Heart valve function.  Signs of a past heart attack.  Fluid buildup around the heart.  Thickening of the heart muscle.  A tumor or infectious growth around the heart valves. Tell a health care provider about:  Any allergies you have.  All medicines you are taking, including vitamins, herbs, eye drops, creams, and over-the-counter medicines.  Any blood disorders you have.  Any surgeries you have had.  Any medical conditions you have.  Whether you are pregnant or may be pregnant. What are the risks? Generally, this is a safe procedure. However, problems may occur, including:  Allergic reaction to dye (contrast) that may be used during the procedure. What happens before the procedure? No specific preparation is needed. You  may eat and drink normally. What happens during the procedure?   An IV tube may be inserted into one of your veins.  You may receive contrast through this tube. A contrast is an injection that improves the quality of the pictures from your heart.  A gel will be applied to your chest.  A wand-like tool (transducer) will be moved over your chest. The gel will help to transmit the sound waves from the transducer.  The sound waves will harmlessly bounce off of your heart to allow the heart images to be captured in real-time motion. The images will be recorded on a computer. The procedure may vary among health care providers and hospitals. What happens after the procedure?  You may return to your normal, everyday life, including diet, activities, and medicines, unless your health care provider tells you not to do that. Summary  An echocardiogram is a procedure that uses painless sound waves (ultrasound) to produce an image of the heart.  Images from an echocardiogram can provide important information about the size and shape  of your heart, heart muscle function, heart valve function, and fluid buildup around your heart.  You do not need to do anything to prepare before this procedure. You may eat and drink normally.  After the echocardiogram is completed, you may return to your normal, everyday life, unless your health care provider tells you not to do that. This information is not intended to replace advice given to you by your health care provider. Make sure you discuss any questions you have with your health care provider. Document Revised: 09/18/2018 Document Reviewed: 06/30/2016 Elsevier Patient Education  2020 [place], [name], MD  01/18/2020 4:47 PM    [place]

## 2020-01-18 NOTE — Patient Instructions (Signed)
Medication Instructions:   Your physician has recommended you make the following change in your medication:   1.  STOP your hydrochlorothiazide (HYDRODIURIL). 2.  START lisinopril (ZESTRIL) 10 MG tablet: Take 1 tablet (10 mg total) by mouth daily. 3.  START nitroGLYCERIN (NITROSTAT) 0.4 MG SL tablet: Place 1 tablet (0.4 mg total) under the tongue every 5 (five) minutes as needed for chest pain for a max dose of 3X.  *If you need a refill on your cardiac medications before your next appointment, please call your pharmacy*   Lab Work: Your physician recommends that you return for lab work in: When you come in for your Echo, we will draw a BMP lab.  If you have labs (blood work) drawn today and your tests are completely normal, you will receive your results only by:  [place] (if you have MyChart) OR  A paper copy in the mail If you have any lab test that is abnormal or we need to change your treatment, we will call you to review the results.   Testing/Procedures:  1.  Your physician has requested that you have an echocardiogram. Echocardiography is a painless test that uses sound waves to create images of your heart. It provides your doctor with information about the size and shape of your heart and how well your hearts chambers and valves are working. This procedure takes approximately one hour. There are no restrictions for this procedure.  2.  Your physician has requested that you have a lexiscan myoview.     [place]  Your caregiver has ordered a Stress Test with nuclear imaging. The purpose of this test is to evaluate the blood supply to your heart muscle. This procedure is referred to as a "Non-Invasive Stress Test." This is because other than having an IV started in your vein, nothing is inserted or "invades" your body. Cardiac stress tests are done to find areas of poor blood flow to the heart by determining the extent of coronary artery disease (CAD). Some patients  exercise on a treadmill, which naturally increases the blood flow to your heart, while others who are  unable to walk on a treadmill due to physical limitations have a pharmacologic/chemical stress agent called Lexiscan . This medicine will mimic walking on a treadmill by temporarily increasing your coronary blood flow.   Please note: these test may take anywhere between 2-4 hours to complete   PLEASE REPORT TO [facility] AT THE FIRST DESK WILL DIRECT YOU WHERE TO GO  Date of Procedure:_____________________________________  Arrival Time for Procedure:______________________________   PLEASE NOTIFY THE OFFICE AT LEAST 24 HOURS IN ADVANCE IF YOU ARE UNABLE TO KEEP YOUR APPOINTMENT.  [phone] AND  PLEASE NOTIFY NUCLEAR MEDICINE AT [facility] AT LEAST 24 HOURS IN ADVANCE IF YOU ARE UNABLE TO KEEP YOUR APPOINTMENT. [phone]  How to prepare for your Myoview test:  1. Do not eat or drink after midnight 2. No caffeine for 24 hours prior to test 3. No smoking 24 hours prior to test. 4. Your medication may be taken with water.  If your doctor stopped a medication because of this test, do not take that medication. 5. Ladies, please do not wear dresses.  Skirts or pants are appropriate. Please wear a short sleeve shirt. 6. No perfume, cologne or lotion. 7. Wear comfortable walking shoes. No heels!        Follow-Up: At [facility], you and your health needs are our priority.  As  part of our continuing mission to provide you with exceptional heart care, we have created designated Provider Care Teams.  These Care Teams include your primary Cardiologist (physician) and Advanced Practice Providers (APPs -  Physician Assistants and Nurse Practitioners) who all work together to provide you with the care you need, when you need it.  We recommend signing up for the patient portal called "MyChart".  Sign up information is provided on this After Visit Summary.  MyChart  is used to connect with patients for Virtual Visits (Telemedicine).  Patients are able to view lab/test results, encounter notes, upcoming appointments, etc.  Non-urgent messages can be sent to your provider as well.   To learn more about what you can do with MyChart, go to [email or web address].    Your next appointment:   Follow up after Myoview and Echo   The format for your next appointment:   In Person  Provider:   [name], MD   Other Instructions   Echocardiogram An echocardiogram is a procedure that uses painless sound waves (ultrasound) to produce an image of the heart. Images from an echocardiogram can provide important information about:  Signs of coronary artery disease (CAD).  Aneurysm detection. An aneurysm is a weak or damaged part of an artery wall that bulges out from the normal force of blood pumping through the body.  Heart size and shape. Changes in the size or shape of the heart can be associated with certain conditions, including heart failure, aneurysm, and CAD.  Heart muscle function.  Heart valve function.  Signs of a past heart attack.  Fluid buildup around the heart.  Thickening of the heart muscle.  A tumor or infectious growth around the heart valves. Tell a health care provider about:  Any allergies you have.  All medicines you are taking, including vitamins, herbs, eye drops, creams, and over-the-counter medicines.  Any blood disorders you have.  Any surgeries you have had.  Any medical conditions you have.  Whether you are pregnant or may be pregnant. What are the risks? Generally, this is a safe procedure. However, problems may occur, including:  Allergic reaction to dye (contrast) that may be used during the procedure. What happens before the procedure? No specific preparation is needed. You may eat and drink normally. What happens during the procedure?   An IV tube may be inserted into one of your veins.  You  may receive contrast through this tube. A contrast is an injection that improves the quality of the pictures from your heart.  A gel will be applied to your chest.  A wand-like tool (transducer) will be moved over your chest. The gel will help to transmit the sound waves from the transducer.  The sound waves will harmlessly bounce off of your heart to allow the heart images to be captured in real-time motion. The images will be recorded on a computer. The procedure may vary among health care providers and hospitals. What happens after the procedure?  You may return to your normal, everyday life, including diet, activities, and medicines, unless your health care provider tells you not to do that. Summary  An echocardiogram is a procedure that uses painless sound waves (ultrasound) to produce an image of the heart.  Images from an echocardiogram can provide important information about the size and shape of your heart, heart muscle function, heart valve function, and fluid buildup around your heart.  You do not need to do anything to prepare before this procedure. You may  eat and drink normally.  After the echocardiogram is completed, you may return to your normal, everyday life, unless your health care provider tells you not to do that. This information is not intended to replace advice given to you by your health care provider. Make sure you discuss any questions you have with your health care provider. Document Revised: 09/18/2018 Document Reviewed: 06/30/2016 Elsevier Patient Education  Learned.

## 2020-01-18 NOTE — Telephone Encounter (Signed)
Patient left a voicemail stating that she had to go to the ER. Patient stated that she had an x-ray done and has questions about the Aorta. Patient requested that Dr. Diona Browner review the results and explain to her what the results mean. Patient stated that she has already sent two messages.

## 2020-01-21 ENCOUNTER — Ambulatory Visit (INDEPENDENT_AMBULATORY_CARE_PROVIDER_SITE_OTHER): Payer: BC Managed Care – PPO

## 2020-01-21 ENCOUNTER — Other Ambulatory Visit (INDEPENDENT_AMBULATORY_CARE_PROVIDER_SITE_OTHER): Payer: BC Managed Care – PPO

## 2020-01-21 ENCOUNTER — Other Ambulatory Visit: Payer: Self-pay

## 2020-01-21 DIAGNOSIS — R079 Chest pain, unspecified: Secondary | ICD-10-CM | POA: Diagnosis not present

## 2020-01-21 DIAGNOSIS — I10 Essential (primary) hypertension: Secondary | ICD-10-CM | POA: Diagnosis not present

## 2020-01-22 LAB — BASIC METABOLIC PANEL
BUN/Creatinine Ratio: 33 — ABNORMAL HIGH (ref 12–28)
BUN: 16 mg/dL (ref 8–27)
CO2: 27 mmol/L (ref 20–29)
Calcium: 9.2 mg/dL (ref 8.7–10.3)
Chloride: 100 mmol/L (ref 96–106)
Creatinine, Ser: 0.48 mg/dL — ABNORMAL LOW (ref 0.57–1.00)
GFR calc Af Amer: 120 mL/min/{1.73_m2} (ref >59)
GFR calc non Af Amer: 104 mL/min/{1.73_m2} (ref >59)
Glucose: 83 mg/dL (ref 65–99)
Potassium: 4.2 mmol/L (ref 3.5–5.2)
Sodium: 139 mmol/L (ref 134–144)

## 2020-01-22 LAB — ECHOCARDIOGRAM COMPLETE
AR max vel: 2.14 cm2
AV Area VTI: 2.49 cm2
AV Area mean vel: 2.52 cm2
AV Mean grad: 3 mmHg
AV Peak grad: 9.1 mmHg
Ao pk vel: 1.51 m/s
Area-P 1/2: 5.42 cm2
Calc EF: 62.4 %
S' Lateral: 2.6 cm
Single Plane A2C EF: 62.4 %
Single Plane A4C EF: 63.2 %

## 2020-01-25 ENCOUNTER — Other Ambulatory Visit: Payer: Self-pay

## 2020-01-25 ENCOUNTER — Encounter
Admission: RE | Admit: 2020-01-25 | Discharge: 2020-01-25 | Disposition: A | Discharge status: Home / Self Care | Payer: BC Managed Care – PPO | Source: Ambulatory Visit | Attending: Cardiology | Admitting: Cardiology

## 2020-01-25 DIAGNOSIS — R072 Precordial pain: Secondary | ICD-10-CM | POA: Insufficient documentation

## 2020-01-25 LAB — NM MYOCAR MULTI W/SPECT W/WALL MOTION / EF
LV dias vol: 96 mL (ref 46–106)
LV sys vol: 38 mL
Peak HR: 98 {beats}/min
Percent HR: 62 %
Rest HR: 56 {beats}/min
SDS: 1
SRS: 5
SSS: 0
TID: 1.03

## 2020-01-25 MED ORDER — TECHNETIUM TC 99M TETROFOSMIN IV KIT
10.4000 | PACK | Freq: Once | INTRAVENOUS | Status: AC | PRN
  Administered 2020-01-25: 10.4 via INTRAVENOUS

## 2020-01-25 MED ORDER — TECHNETIUM TC 99M TETROFOSMIN IV KIT
10.4100 | PACK | Freq: Once | INTRAVENOUS | Status: AC | PRN
  Administered 2020-01-25: 29.93 via INTRAVENOUS

## 2020-01-25 MED ORDER — REGADENOSON 0.4 MG/5ML IV SOLN
0.4000 mg | Freq: Once | INTRAVENOUS | Status: AC
  Administered 2020-01-25: 0.4 mg via INTRAVENOUS

## 2020-01-25 NOTE — Progress Notes (Signed)
DUE TO COVID-19 ONLY ONE VISITOR IS ALLOWED TO COME WITH YOU AND STAY IN THE WAITING ROOM ONLY DURING PRE OP AND PROCEDURE DAY OF SURGERY. THE 1 VISITOR  MAY VISIT WITH YOU AFTER SURGERY IN YOUR PRIVATE ROOM DURING VISITING HOURS ONLY!  YOU NEED TO HAVE A COVID 19 TEST ON_______ @_______ , THIS TEST MUST BE DONE BEFORE SURGERY,  COVID TESTING SITE [address] [place] [address], IT IS ON THE RIGHT GOING OUT [address] APPROXIMATELY  2 MINUTES PAST [employer] ON THE RIGHT. ONCE YOUR COVID TEST IS COMPLETED,  PLEASE BEGIN THE QUARANTINE INSTRUCTIONS AS OUTLINED IN YOUR HANDOUT.                Kimberly Patton  01/25/2020   Your procedure is scheduled on:  02/02/20  Report to [facility] Main  Entrance   Report to admitting at   0530 AM     Call this number if you have problems the morning of surgery [phone]    Remember: Do not eat food or drink liquids :After Midnight. BRUSH YOUR TEETH MORNING OF SURGERY AND RINSE YOUR MOUTH OUT, NO CHEWING GUM CANDY OR MINTS.     Take these medicines the morning of surgery with A SIP OF WATER:  iNHALERS AS USUAL AND BRING, dILTIAZEM, PEPCID, FLONASE, PROTONIX  DO NOT TAKE ANY DIABETIC MEDICATIONS DAY OF YOUR SURGERY                               You may not have any metal on your body including hair pins and              piercings  Do not wear jewelry, make-up, lotions, powders or perfumes, deodorant             Do not wear nail polish on your fingernails.  Do not shave  48 hours prior to surgery.              Men may shave face and neck.   Do not bring valuables to the hospital. [place].  Contacts, dentures or bridgework may not be worn into surgery.  Leave suitcase in the car. After surgery it may be brought to your room.     Patients discharged the day of surgery will not be allowed to drive home. IF YOU ARE HAVING SURGERY AND GOING HOME THE SAME DAY, YOU  MUST HAVE AN ADULT TO DRIVE YOU HOME AND BE WITH YOU FOR 24 HOURS. YOU MAY GO HOME BY TAXI OR UBER OR ORTHERWISE, BUT AN ADULT MUST ACCOMPANY YOU HOME AND STAY WITH YOU FOR 24 HOURS.  Name and phone number of your driver:               Please read over the following fact sheets you were given: _____________________________________________________________________             [facility] - Preparing for Surgery Before surgery, you can play an important role.  Because skin is not sterile, your skin needs to be as free of germs as possible.  You can reduce the number of germs on your skin by washing with CHG (chlorahexidine gluconate) soap before surgery.  CHG is an antiseptic cleaner which kills germs and bonds with the skin to continue killing germs even after  washing. Please DO NOT use if you have an allergy to CHG or antibacterial soaps.  If your skin becomes reddened/irritated stop using the CHG and inform your nurse when you arrive at Short Stay. Do not shave (including legs and underarms) for at least 48 hours prior to the first CHG shower.  You may shave your face/neck. Please follow these instructions carefully:  1.  Shower with CHG Soap the night before surgery and the  morning of Surgery.  2.  If you choose to wash your hair, wash your hair first as usual with your  normal  shampoo.  3.  After you shampoo, rinse your hair and body thoroughly to remove the  shampoo.                           4.  Use CHG as you would any other liquid soap.  You can apply chg directly  to the skin and wash                       Gently with a scrungie or clean washcloth.  5.  Apply the CHG Soap to your body ONLY FROM THE NECK DOWN.   Do not use on face/ open                           Wound or open sores. Avoid contact with eyes, ears mouth and genitals (private parts).                       Wash face,  Genitals (private parts) with your normal soap.             6.  Wash thoroughly, paying special attention  to the area where your surgery  will be performed.  7.  Thoroughly rinse your body with warm water from the neck down.  8.  DO NOT shower/wash with your normal soap after using and rinsing off  the CHG Soap.                9.  Pat yourself dry with a clean towel.            10.  Wear clean pajamas.            11.  Place clean sheets on your bed the night of your first shower and do not  sleep with pets. Day of Surgery : Do not apply any lotions/deodorants the morning of surgery.  Please wear clean clothes to the hospital/surgery center.  FAILURE TO FOLLOW THESE INSTRUCTIONS MAY RESULT IN THE CANCELLATION OF YOUR SURGERY PATIENT SIGNATURE_________________________________  NURSE SIGNATURE__________________________________  ________________________________________________________________________

## 2020-01-26 ENCOUNTER — Encounter (HOSPITAL_COMMUNITY): Payer: Self-pay

## 2020-01-26 ENCOUNTER — Encounter (HOSPITAL_COMMUNITY)
Admission: RE | Admit: 2020-01-26 | Discharge: 2020-01-26 | Disposition: A | Discharge status: Home / Self Care | Payer: BC Managed Care – PPO | Source: Ambulatory Visit | Attending: Surgery | Admitting: Surgery

## 2020-01-26 ENCOUNTER — Other Ambulatory Visit: Payer: Self-pay

## 2020-01-26 DIAGNOSIS — Z96651 Presence of right artificial knee joint: Secondary | ICD-10-CM | POA: Insufficient documentation

## 2020-01-26 DIAGNOSIS — Z79899 Other long term (current) drug therapy: Secondary | ICD-10-CM | POA: Insufficient documentation

## 2020-01-26 DIAGNOSIS — Z01812 Encounter for preprocedural laboratory examination: Secondary | ICD-10-CM | POA: Insufficient documentation

## 2020-01-26 DIAGNOSIS — F419 Anxiety disorder, unspecified: Secondary | ICD-10-CM | POA: Insufficient documentation

## 2020-01-26 DIAGNOSIS — Z9049 Acquired absence of other specified parts of digestive tract: Secondary | ICD-10-CM | POA: Insufficient documentation

## 2020-01-26 DIAGNOSIS — I471 Supraventricular tachycardia: Secondary | ICD-10-CM | POA: Insufficient documentation

## 2020-01-26 DIAGNOSIS — D649 Anemia, unspecified: Secondary | ICD-10-CM | POA: Insufficient documentation

## 2020-01-26 DIAGNOSIS — I10 Essential (primary) hypertension: Secondary | ICD-10-CM | POA: Diagnosis not present

## 2020-01-26 DIAGNOSIS — Z791 Long term (current) use of non-steroidal anti-inflammatories (NSAID): Secondary | ICD-10-CM | POA: Diagnosis not present

## 2020-01-26 DIAGNOSIS — K449 Diaphragmatic hernia without obstruction or gangrene: Secondary | ICD-10-CM | POA: Diagnosis not present

## 2020-01-26 DIAGNOSIS — K219 Gastro-esophageal reflux disease without esophagitis: Secondary | ICD-10-CM | POA: Diagnosis not present

## 2020-01-26 HISTORY — DX: Anemia, unspecified: D64.9

## 2020-01-26 HISTORY — DX: Anxiety disorder, unspecified: F41.9

## 2020-01-26 HISTORY — DX: Unspecified asthma, uncomplicated: J45.909

## 2020-01-26 HISTORY — DX: Cardiac arrhythmia, unspecified: I49.9

## 2020-01-26 LAB — BASIC METABOLIC PANEL
Anion gap: 10 (ref 5–15)
BUN: 12 mg/dL (ref 8–23)
CO2: 28 mmol/L (ref 22–32)
Calcium: 8.9 mg/dL (ref 8.9–10.3)
Chloride: 100 mmol/L (ref 98–111)
Creatinine, Ser: 0.46 mg/dL (ref 0.44–1.00)
GFR calc Af Amer: >60 mL/min (ref >60)
GFR calc non Af Amer: >60 mL/min (ref >60)
Glucose, Bld: 87 mg/dL (ref 70–99)
Potassium: 3.5 mmol/L (ref 3.5–5.1)
Sodium: 138 mmol/L (ref 135–145)

## 2020-01-26 LAB — CBC
HCT: 37.1 % (ref 36.0–46.0)
Hemoglobin: 11.4 g/dL — ABNORMAL LOW (ref 12.0–15.0)
MCH: 25.8 pg — ABNORMAL LOW (ref 26.0–34.0)
MCHC: 30.7 g/dL (ref 30.0–36.0)
MCV: 83.9 fL (ref 80.0–100.0)
Platelets: 266 10*3/uL (ref 150–400)
RBC: 4.42 MIL/uL (ref 3.87–5.11)
RDW: 18 % — ABNORMAL HIGH (ref 11.5–15.5)
WBC: 5.5 10*3/uL (ref 4.0–10.5)
nRBC: 0 % (ref 0.0–0.2)

## 2020-01-26 NOTE — Progress Notes (Signed)
No orders in at preop .  Requested orders on 01/25/20.

## 2020-01-26 NOTE — Progress Notes (Signed)
Anesthesia Review:  PCP: Dr [name]  Cardiologist :DR Agbor-Etang  LOV 01/18/20 Chest x-ray : EKG : 01/18/20  Echo :01/21/20  Stress test:01/25/20  Cardiac Cath :  Activity level: can do a flight of steps without difficulty  Sleep Study/ CPAP : no Fasting Blood Sugar :      / Checks Blood Sugar -- times a day:   Blood Thinner/ Instructions /Last Dose: ASA / Instructions/ Last Dose :

## 2020-01-27 ENCOUNTER — Telehealth: Payer: Self-pay

## 2020-01-27 NOTE — Telephone Encounter (Signed)
-----   Message from [name], MD sent at 01/27/2020  2:45 PM EDT ----- Normal stress test. No evidence for ischemia

## 2020-01-27 NOTE — Progress Notes (Signed)
Anesthesia Chart Review   Case: [id number] Date/Time: 02/02/20 0715   Procedure: REPAIR LARGE SYMPTOMATIC HIATAL HERNIA (N/A )   Anesthesia type: General   Pre-op diagnosis: TYPE III HIATAL HERNIA   [facility] ROOM 04 / [facility]   Surgeons: Johnathan Hausen, MD      DISCUSSION:64 y.o. never smoker with h/o HTN, GERD, asthma, type II hiatal hernia scheduled for above procedure 02/02/2020 with Dr. Johnathan Hausen.   Pt last seen by cardiology 01/18/2020.  H/o paroxysmal SVT, noted to be bradycardic at this visit; diltiazem reduced.  Pt with atypical chest pain.  Echo and [place] ordered for further evaluation.    Echo 01/21/2020 with EF 60-65%, mildly elevated pulmonary artery systolic pressure, estimated right ventricular systolic pressure 37.1 mmHg.    Lexiscan Myoview 01/25/2020, low risk study.   Anticipate pt can proceed with planned procedure barring acute status change.    VS: BP 133/79 (BP Location: Right Arm)   Pulse (!) 56   Temp 37 C (Oral)   Resp 18   Ht 5\' 6"  (1.676 m)   Wt 113.4 kg   SpO2 100%   BMI 40.36 kg/m   PROVIDERS: Jinny Sanders, MD is PCP   [name], MD is Cardiologist  LABS: Labs reviewed: Acceptable for surgery. (all labs ordered are listed, but only abnormal results are displayed)  Labs Reviewed  CBC - Abnormal; Notable for the following components:      Result Value   Hemoglobin 11.4 (*)    MCH 25.8 (*)    RDW 18.0 (*)    All other components within normal limits  BASIC METABOLIC PANEL     IMAGES:   EKG: 01/18/2020 Rate 76 bpm  NSR Incomplete RBBB Minimal voltage criteria for LVH, may be normal variant Nonspecific T wave abnormality   CV: Myocardial Perfusion 01/25/2020  There was no ST segment deviation noted during stress.  No T wave inversion was noted during stress.  The study is normal.  This is a low risk study.  The left ventricular ejection fraction is normal (55-65%).  No significant aortic or coronary  artery calcifications  Echo 01/21/2020 IMPRESSIONS    1. Left ventricular ejection fraction, by estimation, is 60 to 65%. The  left ventricle has normal function. The left ventricle has no regional  wall motion abnormalities. Left ventricular diastolic parameters are  consistent with Grade I diastolic  dysfunction (impaired relaxation). The average left ventricular global  longitudinal strain is -20.4 %.  2. Right ventricular systolic function is normal. The right ventricular  size is normal. There is mildly elevated pulmonary artery systolic  pressure. The estimated right ventricular systolic pressure is 06.2 mmHg.   Comparison(s): EF 55-65%.  Past Medical History:  Diagnosis Date  . ALLERGIC RHINITIS 04/03/2007  . Anemia   . Anxiety   . Asthma    hx of years ago - no longer a problem   . ASTHMA, PERSISTENT, MODERATE 04/03/2007  . Cancer ([place])    skin   . Dyspnea on exertion    a. 11/2007 Echo: EF 60%.  . Dysrhythmia    hx of heart arrhythmia 10-15 years ago - followed by DR Lovena Le - not seen in years   . GERD (gastroesophageal reflux disease)   . 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 Hypertension   . Joint pain    HIPS / LEGS  . Knee injury    RT  . Meningioma ([place])   . Morbid obesity ([place])   .  Paroxysmal SVT (supraventricular tachycardia) ([place])    a. 11/2011 48h Holter: RSR, rare PVC's, occas PAC's  . Retinal tear of right eye 01/2016  . Ventral hernia     Past Surgical History:  Procedure Laterality Date  . APPENDECTOMY  1996  . BREAST CYST EXCISION Right 1994   Benign  . CESAREAN SECTION     x2  . CHOLECYSTECTOMY  1995  . EYE SURGERY  01/2016   Repair retinal tear  . [place] / 1996   x2  . KNEE ARTHROSCOPY W/ MENISCAL REPAIR  07/25/13   right knee Dr. Mardelle Matte  . LAPAROSCOPIC GASTRIC SLEEVE RESECTION N/A 06/08/2013   Procedure: LAPAROSCOPIC GASTRIC SLEEVE RESECTION AND EXCISION OF SEBACEUS CYST FROM MID CHEST takedown of incarcerated ventral hernia and  primary repair endoscopy;  Surgeon: [name], MD;  Location[facility];  Service: General;  Laterality: N/A;  . moles  06/2013   removed 2 moles from under arm and  lowback  . NECK SURGERY     occipital nerve damage- injections   . OVARIAN CYST REMOVAL  1970  . PILONIDAL CYST EXCISION  1975  . RETINAL TEAR REPAIR CRYOTHERAPY Right 01/2016   [place]  . SKIN CANCER EXCISION    . TONSILLECTOMY    . UPPER GI ENDOSCOPY  06/08/2013   Procedure: UPPER GI ENDOSCOPY;  Surgeon: [name], MD;  Location[facility];  Service: General;;  . US ECHOCARDIOGRAPHY  12/2011   WNL - EF 55-60%, mild MR, grade 1 diastolic dysnfiction (mild)  . VENTRAL HERNIA REPAIR  2000    MEDICATIONS: . acetaminophen (TYLENOL) 500 MG tablet  . albuterol (PROAIR HFA) 108 (90 Base) MCG/ACT inhaler  . Calcium Citrate-Vitamin D (CITRACAL + D PO)  . Cyanocobalamin (VITAMIN B-12) 5000 MCG SUBL  . diltiazem (CARDIZEM CD) 120 MG 24 hr capsule  . ELDERBERRY PO  . famotidine (PEPCID) 20 MG tablet  . ferrous sulfate 325 (65 FE) MG EC tablet  . fluticasone (FLONASE) 50 MCG/ACT nasal spray  . lisinopril (ZESTRIL) 10 MG tablet  . Multiple Vitamin (MULTIVITAMIN WITH MINERALS) TABS tablet  . nitroGLYCERIN (NITROSTAT) 0.4 MG SL tablet  . Omega-3 Fatty Acids (FISH OIL) 1200 MG CAPS  . pantoprazole (PROTONIX) 40 MG tablet  . potassium chloride SA (KLOR-CON) 20 MEQ tablet  . sucralfate (CARAFATE) 1 g tablet  . sucralfate (CARAFATE) 1 GM/10ML suspension  . traMADol (ULTRAM) 50 MG tablet  . TURMERIC PO  . vitamin C (ASCORBIC ACID) 250 MG tablet   . methylPREDNISolone acetate (DEPO-MEDROL) injection 80 mg     Maia Plan [facility] Pre-Surgical Testing [phone]

## 2020-01-27 NOTE — Telephone Encounter (Signed)
Attempted to call patient. [facility] 01/27/2020

## 2020-01-27 NOTE — Telephone Encounter (Signed)
Called and spoke with patient to relay Dr. Thereasa Solo following note:  Please patient should contact pcp/Dr. Diona Browner regarding ferrous sulfate dose.   Echocardiogram was normal. Normal systolic and diastolic function.   Patient verbalized understanding and agreed with plan.

## 2020-01-27 NOTE — Anesthesia Preprocedure Evaluation (Addendum)
Anesthesia Evaluation  Patient identified by MRN, date of birth, ID band Patient awake    Reviewed: Allergy & Precautions, NPO status , Patient's Chart, lab work & pertinent test results  Airway Mallampati: I       Dental no notable dental hx. (+) Teeth Intact   Pulmonary asthma ,    Pulmonary exam normal breath sounds clear to auscultation       Cardiovascular hypertension, Pt. on medications Normal cardiovascular exam Rhythm:Regular Rate:Normal     Neuro/Psych PSYCHIATRIC DISORDERS Anxiety negative neurological ROS     GI/Hepatic Neg liver ROS, GERD  Medicated,  Endo/Other  Morbid obesity  Renal/GU negative Renal ROS  negative genitourinary   Musculoskeletal  (+) Arthritis , Osteoarthritis,    Abdominal (+) + obese,   Peds  Hematology  (+) anemia ,   Anesthesia Other Findings   Reproductive/Obstetrics                           Anesthesia Physical Anesthesia Plan  ASA: III  Anesthesia Plan: General   Post-op Pain Management:    Induction: Intravenous  PONV Risk Score and Plan: 4 or greater and Ondansetron, Dexamethasone and Midazolam  Airway Management Planned: Oral ETT  Additional Equipment: None  Intra-op Plan:   Post-operative Plan: Extubation in OR  Informed Consent: I have reviewed the patients History and Physical, chart, labs and discussed the procedure including the risks, benefits and alternatives for the proposed anesthesia with the patient or authorized representative who has indicated his/her understanding and acceptance.     Dental advisory given  Plan Discussed with:   Anesthesia Plan Comments: (See PAT note 01/26/2020, Konrad Felix, PA-C)      Anesthesia Quick Evaluation

## 2020-01-28 ENCOUNTER — Ambulatory Visit: Payer: Self-pay | Admitting: Surgery

## 2020-01-29 ENCOUNTER — Other Ambulatory Visit (HOSPITAL_COMMUNITY)
Admission: RE | Admit: 2020-01-29 | Discharge: 2020-01-29 | Disposition: A | Discharge status: Home / Self Care | Payer: BC Managed Care – PPO | Source: Ambulatory Visit | Attending: Surgery | Admitting: Surgery

## 2020-01-29 DIAGNOSIS — Z20822 Contact with and (suspected) exposure to covid-19: Secondary | ICD-10-CM | POA: Diagnosis not present

## 2020-01-29 DIAGNOSIS — Z01812 Encounter for preprocedural laboratory examination: Secondary | ICD-10-CM | POA: Insufficient documentation

## 2020-01-29 LAB — SARS CORONAVIRUS 2 (TAT 6-24 HRS): SARS Coronavirus 2: NEGATIVE

## 2020-02-01 MED ORDER — BUPIVACAINE LIPOSOME 1.3 % IJ SUSP
20.0000 mL | INTRAMUSCULAR | Status: DC
  Filled 2020-02-01 (×2): qty 20

## 2020-02-01 NOTE — H&P (Signed)
Chief Complaint:  GERD and hiatal hernia  History of Present Illness:  Kimberly Patton is an 64 y.o. female who had a sleeve gastrectomy before having a complex ventral hernia repair by [name].  She presents with a symptomatic hiatal hernia involving her sleeve gastrectomy.    Past Medical History:  Diagnosis Date  . ALLERGIC RHINITIS 04/03/2007  . Anemia   . Anxiety   . Asthma    hx of years ago - no longer a problem   . ASTHMA, PERSISTENT, MODERATE 04/03/2007  . Cancer ([place])    skin   . Dyspnea on exertion    a. 11/2007 Echo: EF 60%.  . Dysrhythmia    hx of heart arrhythmia 10-15 years ago - followed by DR Lovena Le - not seen in years   . GERD (gastroesophageal reflux disease)   . 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 Hypertension   . Joint pain    HIPS / LEGS  . Knee injury    RT  . Meningioma ([place])   . Morbid obesity ([place])   . Paroxysmal SVT (supraventricular tachycardia) ([place])    a. 11/2011 48h Holter: RSR, rare PVC's, occas PAC's  . Retinal tear of right eye 01/2016  . Ventral hernia     Past Surgical History:  Procedure Laterality Date  . APPENDECTOMY  1996  . BREAST CYST EXCISION Right 1994   Benign  . CESAREAN SECTION     x2  . CHOLECYSTECTOMY  1995  . EYE SURGERY  01/2016   Repair retinal tear  . [place] / 1996   x2  . KNEE ARTHROSCOPY W/ MENISCAL REPAIR  07/25/13   right knee Dr. Mardelle Matte  . LAPAROSCOPIC GASTRIC SLEEVE RESECTION N/A 06/08/2013   Procedure: LAPAROSCOPIC GASTRIC SLEEVE RESECTION AND EXCISION OF SEBACEUS CYST FROM MID CHEST takedown of incarcerated ventral hernia and primary repair endoscopy;  Surgeon: [name], MD;  Location: [facility];  Service: General;  Laterality: N/A;  . moles  06/2013   removed 2 moles from under arm and  lowback  . NECK SURGERY     occipital nerve damage- injections   . OVARIAN CYST REMOVAL  1970  . PILONIDAL CYST EXCISION  1975  . RETINAL TEAR REPAIR CRYOTHERAPY Right 01/2016   [place]  . SKIN CANCER EXCISION     . TONSILLECTOMY    . UPPER GI ENDOSCOPY  06/08/2013   Procedure: UPPER GI ENDOSCOPY;  Surgeon: [name], MD;  Location: [facility];  Service: General;;  . US ECHOCARDIOGRAPHY  12/2011   WNL - EF 55-60%, mild MR, grade 1 diastolic dysnfiction (mild)  . VENTRAL HERNIA REPAIR  2000    Current Facility-Administered Medications  Medication Dose Route Frequency Provider Last Rate Last Admin  . [START ON 02/02/2020] bupivacaine liposome (EXPAREL) 1.3 % injection 266 mg  20 mL Infiltration On Call to OR Efraim Kaufmann, RPH      . methylPREDNISolone acetate (DEPO-MEDROL) injection 80 mg  80 mg Intramuscular Once Jearld Fenton, NP       Current Outpatient Medications  Medication Sig Dispense Refill  . acetaminophen (TYLENOL) 500 MG tablet Take 1,000 mg by mouth every 6 (six) hours as needed (for pain.).    Marland Kitchen albuterol (PROAIR HFA) 108 (90 Base) MCG/ACT inhaler TAKE 2 PUFFS BY MOUTH EVERY 6 HOURS AS NEEDED 8.5 Inhaler 11  . Calcium Citrate-Vitamin D (CITRACAL + D PO) Take 2 tablets by mouth daily.     . Cyanocobalamin (  VITAMIN B-12) 5000 MCG SUBL Place 5,000 mcg under the tongue daily.    Marland Kitchen diltiazem (CARDIZEM CD) 120 MG 24 hr capsule Take 1 capsule (120 mg total) by mouth daily. 30 capsule 11  . ELDERBERRY PO Take 1,000 mg by mouth daily.     . famotidine (PEPCID) 20 MG tablet Take 20 mg by mouth 2 (two) times daily.     . ferrous sulfate 325 (65 FE) MG EC tablet Take 1 tablet (325 mg total) by mouth 2 (two) times daily with a meal. (Patient taking differently: Take 325 mg by mouth daily. ) 60 tablet 3  . fluticasone (FLONASE) 50 MCG/ACT nasal spray SPRAY 2 SPRAYS INTO EACH NOSTRIL EVERY DAY (Patient taking differently: Place 2 sprays into both nostrils daily. ) 48 mL 0  . lisinopril (ZESTRIL) 10 MG tablet Take 1 tablet (10 mg total) by mouth daily. 30 tablet 3  . Multiple Vitamin (MULTIVITAMIN WITH MINERALS) TABS tablet Take 2 tablets by mouth daily. Multivitamin for Women    .  nitroGLYCERIN (NITROSTAT) 0.4 MG SL tablet Place 1 tablet (0.4 mg total) under the tongue every 5 (five) minutes as needed for chest pain. 30 tablet 0  . Omega-3 Fatty Acids (FISH OIL) 1200 MG CAPS Take 1,200 mg by mouth daily.     . pantoprazole (PROTONIX) 40 MG tablet TAKE 1 TABLET BY MOUTH TWICE A DAY (Patient taking differently: Take 40 mg by mouth 2 (two) times daily. Morning & Mid-afternoon) 180 tablet 1  . sucralfate (CARAFATE) 1 GM/10ML suspension Take 1 g by mouth 2 (two) times daily.    . traMADol (ULTRAM) 50 MG tablet Take 1 tablet (50 mg total) by mouth every 8 (eight) hours as needed. (Patient taking differently: Take 50 mg by mouth every 8 (eight) hours as needed (pain.). ) 10 tablet 0  . TURMERIC PO Take 1 capsule by mouth daily.    . vitamin C (ASCORBIC ACID) 250 MG tablet Take 250 mg by mouth daily.    . potassium chloride SA (KLOR-CON) 20 MEQ tablet Take 1 tablet (20 mEq total) by mouth daily. (Patient not taking: Reported on 01/22/2020) 14 tablet 0  . sucralfate (CARAFATE) 1 g tablet Take 1 g by mouth 2 (two) times daily.     Wasp venom, Prednisone, and Sodium hypochlorite Family History  Problem Relation Age of Onset  . Breast cancer Mother   . Diabetes Mother   . Hypertension Mother   . Kidney failure Mother   . Diabetes Father   . Hypertension Father   . Diabetes Other   . Hypertension Other   . Stroke Other   . Hemophilia Other    Social History:   reports that she has never smoked. She has never used smokeless tobacco. She reports current alcohol use. She reports that she does not use drugs.   REVIEW OF SYSTEMS : Negative except for see problem list  Physical Exam:   There were no vitals taken for this visit. There is no height or weight on file to calculate BMI.  Gen:  WDWN WF NAD  Neurological: Alert and oriented to person, place, and time. Motor and sensory function is grossly intact  Head: Normocephalic and atraumatic.  Eyes: Conjunctivae are normal.  Pupils are equal, round, and reactive to light. No scleral icterus.  Neck: Normal range of motion. Neck supple. No tracheal deviation or thyromegaly present.  Cardiovascular:  SR without murmurs or gallops.  No carotid bruits Breast:  Not examined Respiratory:  Effort normal.  No respiratory distress. No chest wall tenderness. Breath sounds normal.  No wheezes, rales or rhonchi.  Abdomen:  mulitple abdominal scars GU:  Not examined Musculoskeletal: Normal range of motion. Extremities are nontender. No cyanosis, edema or clubbing noted Lymphadenopathy: No cervical, preauricular, postauricular or axillary adenopathy is present Skin: Skin is warm and dry. No rash noted. No diaphoresis. No erythema. No pallor. Pscyh: Normal mood and affect. Behavior is normal. Judgment and thought content normal.   LABORATORY RESULTS: No results found for this or any previous visit (from the past 48 hour(s)).   RADIOLOGY RESULTS: No results found.  Problem List: Patient Active Problem List   Diagnosis Date Noted  . Bradycardia 01/01/2020  . Orthostatic hypotension 01/01/2020  . Globus sensation 11/23/2019  . Sore throat 10/22/2019  . Elevated lipase 08/27/2019  . Abdominal pain, epigastric 08/11/2019  . Hypercholesteremia 06/09/2019  . Neck swelling 02/14/2019  . Ear pain, right 09/12/2018  . Chronic neuropathic pain 07/31/2018  . Thyroid nodule 04/15/2018  . Skin lesion of scalp 09/06/2017  . Hypokalemia 06/08/2017  . Iron deficiency anemia 06/08/2017  . Osteoporosis 12/04/2016  . Osteoarthritis of knee 07/17/2016  . Numbness and tingling of both feet 01/27/2016  . Meningioma ([place]) 01/18/2016  . Family history of aortic stenosis 01/20/2015  . Status post laparoscopic sleeve gastrectomy Dec 2014 06/08/2013  . Morbid obesity ([place]) 12/08/2012  . HTN (hypertension) 10/11/2011  . Varicose veins of lower extremities with other complications 70/62/3762  . FIBROIDS, UTERUS 04/03/2007  . Allergic  rhinitis 04/03/2007  . Asthma, mild intermittent 04/03/2007    Assessment & Plan: GERD in sleeve gastrectomy with hiatal hernia is history of prior complex ventral hernia repair.      [name] Done, MD, [facility] [phone] beeper [phone]  02/01/2020 10:00 PM

## 2020-02-02 ENCOUNTER — Inpatient Hospital Stay (HOSPITAL_COMMUNITY): Payer: BC Managed Care – PPO | Admitting: Physician Assistant

## 2020-02-02 ENCOUNTER — Encounter (HOSPITAL_COMMUNITY)
Admission: RE | Disposition: A | Discharge status: Home / Self Care | Payer: Self-pay | Source: Home / Self Care | Attending: Surgery

## 2020-02-02 ENCOUNTER — Inpatient Hospital Stay (HOSPITAL_COMMUNITY)
Admission: RE | Admit: 2020-02-02 | Discharge: 2020-02-04 | DRG: 328 | Disposition: A | Discharge status: Home / Self Care | Payer: BC Managed Care – PPO | Attending: Surgery | Admitting: Surgery

## 2020-02-02 ENCOUNTER — Inpatient Hospital Stay (HOSPITAL_COMMUNITY): Payer: BC Managed Care – PPO | Admitting: Certified Registered Nurse Anesthetist

## 2020-02-02 ENCOUNTER — Encounter (HOSPITAL_COMMUNITY): Payer: Self-pay | Admitting: Surgery

## 2020-02-02 ENCOUNTER — Other Ambulatory Visit: Payer: Self-pay

## 2020-02-02 DIAGNOSIS — J4520 Mild intermittent asthma, uncomplicated: Secondary | ICD-10-CM | POA: Diagnosis present

## 2020-02-02 DIAGNOSIS — K449 Diaphragmatic hernia without obstruction or gangrene: Secondary | ICD-10-CM | POA: Diagnosis present

## 2020-02-02 DIAGNOSIS — Z20822 Contact with and (suspected) exposure to covid-19: Secondary | ICD-10-CM | POA: Diagnosis present

## 2020-02-02 DIAGNOSIS — K219 Gastro-esophageal reflux disease without esophagitis: Secondary | ICD-10-CM | POA: Diagnosis present

## 2020-02-02 DIAGNOSIS — Z79899 Other long term (current) drug therapy: Secondary | ICD-10-CM | POA: Diagnosis not present

## 2020-02-02 DIAGNOSIS — I10 Essential (primary) hypertension: Secondary | ICD-10-CM | POA: Diagnosis present

## 2020-02-02 DIAGNOSIS — K660 Peritoneal adhesions (postprocedural) (postinfection): Secondary | ICD-10-CM | POA: Diagnosis present

## 2020-02-02 DIAGNOSIS — Z9884 Bariatric surgery status: Secondary | ICD-10-CM | POA: Diagnosis not present

## 2020-02-02 DIAGNOSIS — Z8249 Family history of ischemic heart disease and other diseases of the circulatory system: Secondary | ICD-10-CM | POA: Diagnosis not present

## 2020-02-02 DIAGNOSIS — Z9889 Other specified postprocedural states: Secondary | ICD-10-CM

## 2020-02-02 DIAGNOSIS — Z8719 Personal history of other diseases of the digestive system: Secondary | ICD-10-CM

## 2020-02-02 HISTORY — PX: LAPAROSCOPIC NISSEN FUNDOPLICATION: SHX1932

## 2020-02-02 LAB — CREATININE, SERUM
Creatinine, Ser: 0.5 mg/dL (ref 0.44–1.00)
GFR calc Af Amer: >60 mL/min (ref >60)
GFR calc non Af Amer: >60 mL/min (ref >60)

## 2020-02-02 LAB — CBC
HCT: 39.5 % (ref 36.0–46.0)
Hemoglobin: 12.2 g/dL (ref 12.0–15.0)
MCH: 26.4 pg (ref 26.0–34.0)
MCHC: 30.9 g/dL (ref 30.0–36.0)
MCV: 85.5 fL (ref 80.0–100.0)
Platelets: 256 10*3/uL (ref 150–400)
RBC: 4.62 MIL/uL (ref 3.87–5.11)
RDW: 18.3 % — ABNORMAL HIGH (ref 11.5–15.5)
WBC: 12.6 10*3/uL — ABNORMAL HIGH (ref 4.0–10.5)
nRBC: 0 % (ref 0.0–0.2)

## 2020-02-02 SURGERY — FUNDOPLICATION, NISSEN, LAPAROSCOPIC
Anesthesia: General | Site: Abdomen

## 2020-02-02 MED ORDER — CHLORHEXIDINE GLUCONATE 0.12 % MT SOLN: 15.0000 mL | Freq: Once | OROMUCOSAL | Status: AC

## 2020-02-02 MED ORDER — ONDANSETRON HCL 4 MG/2ML IJ SOLN
INTRAMUSCULAR | Status: AC
  Filled 2020-02-02: qty 2

## 2020-02-02 MED ORDER — KETOROLAC TROMETHAMINE 30 MG/ML IJ SOLN
30.0000 mg | Freq: Once | INTRAMUSCULAR | Status: AC | PRN
  Administered 2020-02-02: 30 mg via INTRAVENOUS

## 2020-02-02 MED ORDER — PROMETHAZINE HCL 25 MG/ML IJ SOLN: 6.2500 mg | INTRAMUSCULAR | Status: DC | PRN

## 2020-02-02 MED ORDER — PHENYLEPHRINE HCL-NACL 10-0.9 MG/250ML-% IV SOLN
INTRAVENOUS | Status: AC
  Filled 2020-02-02: qty 250

## 2020-02-02 MED ORDER — PROPOFOL 10 MG/ML IV BOLUS
INTRAVENOUS | Status: AC
  Filled 2020-02-02: qty 20

## 2020-02-02 MED ORDER — SUCCINYLCHOLINE CHLORIDE 200 MG/10ML IV SOSY
PREFILLED_SYRINGE | INTRAVENOUS | Status: AC
  Filled 2020-02-02: qty 10

## 2020-02-02 MED ORDER — SUCCINYLCHOLINE CHLORIDE 200 MG/10ML IV SOSY
PREFILLED_SYRINGE | INTRAVENOUS | Status: DC | PRN
  Administered 2020-02-02: 160 mg via INTRAVENOUS

## 2020-02-02 MED ORDER — ONDANSETRON HCL 4 MG/2ML IJ SOLN
INTRAMUSCULAR | Status: DC | PRN
  Administered 2020-02-02: 4 mg via INTRAVENOUS

## 2020-02-02 MED ORDER — ROCURONIUM BROMIDE 10 MG/ML (PF) SYRINGE
PREFILLED_SYRINGE | INTRAVENOUS | Status: AC
  Filled 2020-02-02: qty 10

## 2020-02-02 MED ORDER — PHENYLEPHRINE HCL-NACL 10-0.9 MG/250ML-% IV SOLN
INTRAVENOUS | Status: DC | PRN
  Administered 2020-02-02: 20 ug/min via INTRAVENOUS

## 2020-02-02 MED ORDER — LIDOCAINE HCL 2 % IJ SOLN
INTRAMUSCULAR | Status: AC
  Filled 2020-02-02: qty 20

## 2020-02-02 MED ORDER — KETAMINE HCL 10 MG/ML IJ SOLN
INTRAMUSCULAR | Status: DC | PRN
  Administered 2020-02-02: 30 mg via INTRAVENOUS

## 2020-02-02 MED ORDER — ROCURONIUM BROMIDE 10 MG/ML (PF) SYRINGE
PREFILLED_SYRINGE | INTRAVENOUS | Status: DC | PRN
  Administered 2020-02-02 (×2): 20 mg via INTRAVENOUS
  Administered 2020-02-02: 60 mg via INTRAVENOUS
  Administered 2020-02-02 (×2): 20 mg via INTRAVENOUS

## 2020-02-02 MED ORDER — LACTATED RINGERS IR SOLN
Status: DC | PRN
  Administered 2020-02-02: 1000 mL

## 2020-02-02 MED ORDER — HEPARIN SODIUM (PORCINE) 5000 UNIT/ML IJ SOLN
5000.0000 [IU] | Freq: Three times a day (TID) | INTRAMUSCULAR | Status: DC
  Administered 2020-02-02 – 2020-02-04 (×6): 5000 [IU] via SUBCUTANEOUS
  Filled 2020-02-02 (×6): qty 1

## 2020-02-02 MED ORDER — ENSURE PRE-SURGERY PO LIQD
592.0000 mL | Freq: Once | ORAL | Status: DC
  Filled 2020-02-02: qty 592

## 2020-02-02 MED ORDER — SODIUM CHLORIDE 0.9 % IV SOLN
2.0000 g | INTRAVENOUS | Status: AC
  Administered 2020-02-02: 2 g via INTRAVENOUS
  Filled 2020-02-02: qty 2

## 2020-02-02 MED ORDER — SODIUM CHLORIDE (PF) 0.9 % IJ SOLN
INTRAMUSCULAR | Status: AC
  Filled 2020-02-02: qty 10

## 2020-02-02 MED ORDER — HYDROMORPHONE HCL 1 MG/ML IJ SOLN
INTRAMUSCULAR | Status: AC
  Administered 2020-02-02: 0.5 mg via INTRAVENOUS
  Filled 2020-02-02: qty 1

## 2020-02-02 MED ORDER — SUGAMMADEX SODIUM 500 MG/5ML IV SOLN
INTRAVENOUS | Status: AC
  Filled 2020-02-02: qty 5

## 2020-02-02 MED ORDER — ACETAMINOPHEN 500 MG PO TABS
1000.0000 mg | ORAL_TABLET | Freq: Three times a day (TID) | ORAL | Status: DC
  Administered 2020-02-02 – 2020-02-04 (×6): 1000 mg via ORAL
  Filled 2020-02-02 (×6): qty 2

## 2020-02-02 MED ORDER — KCL IN DEXTROSE-NACL 20-5-0.45 MEQ/L-%-% IV SOLN
INTRAVENOUS | Status: DC
  Filled 2020-02-02 (×4): qty 1000

## 2020-02-02 MED ORDER — BUPIVACAINE LIPOSOME 1.3 % IJ SUSP
INTRAMUSCULAR | Status: DC | PRN
  Administered 2020-02-02: 20 mL

## 2020-02-02 MED ORDER — DEXAMETHASONE SODIUM PHOSPHATE 10 MG/ML IJ SOLN
INTRAMUSCULAR | Status: DC | PRN
  Administered 2020-02-02: 8 mg via INTRAVENOUS

## 2020-02-02 MED ORDER — MEPERIDINE HCL 50 MG/ML IJ SOLN: 6.2500 mg | INTRAMUSCULAR | Status: DC | PRN

## 2020-02-02 MED ORDER — LIDOCAINE 20MG/ML (2%) 15 ML SYRINGE OPTIME
INTRAMUSCULAR | Status: DC | PRN
  Administered 2020-02-02: 1.5 mg/kg/h via INTRAVENOUS

## 2020-02-02 MED ORDER — FENTANYL CITRATE (PF) 100 MCG/2ML IJ SOLN
INTRAMUSCULAR | Status: AC
  Filled 2020-02-02: qty 2

## 2020-02-02 MED ORDER — ENSURE PRE-SURGERY PO LIQD
296.0000 mL | Freq: Once | ORAL | Status: DC
  Filled 2020-02-02: qty 296

## 2020-02-02 MED ORDER — MIDAZOLAM HCL 2 MG/2ML IJ SOLN
INTRAMUSCULAR | Status: AC
  Filled 2020-02-02: qty 2

## 2020-02-02 MED ORDER — CHLORHEXIDINE GLUCONATE CLOTH 2 % EX PADS: 6.0000 | MEDICATED_PAD | Freq: Once | CUTANEOUS | Status: DC

## 2020-02-02 MED ORDER — ONDANSETRON HCL 4 MG/2ML IJ SOLN: 4.0000 mg | INTRAMUSCULAR | Status: DC | PRN

## 2020-02-02 MED ORDER — ALBUTEROL SULFATE (2.5 MG/3ML) 0.083% IN NEBU: 2.5000 mg | INHALATION_SOLUTION | Freq: Four times a day (QID) | RESPIRATORY_TRACT | Status: DC | PRN

## 2020-02-02 MED ORDER — MORPHINE SULFATE (PF) 2 MG/ML IV SOLN
1.0000 mg | INTRAVENOUS | Status: DC | PRN
  Administered 2020-02-02: 2 mg via INTRAVENOUS
  Filled 2020-02-02 (×2): qty 1

## 2020-02-02 MED ORDER — LACTATED RINGERS IV SOLN: INTRAVENOUS | Status: DC

## 2020-02-02 MED ORDER — FENTANYL CITRATE (PF) 250 MCG/5ML IJ SOLN
INTRAMUSCULAR | Status: AC
  Filled 2020-02-02: qty 5

## 2020-02-02 MED ORDER — ENSURE MAX PROTEIN PO LIQD
2.0000 [oz_av] | ORAL | Status: DC
  Administered 2020-02-03 – 2020-02-04 (×14): 2 [oz_av] via ORAL

## 2020-02-02 MED ORDER — PROPOFOL 10 MG/ML IV BOLUS
INTRAVENOUS | Status: DC | PRN
  Administered 2020-02-02: 200 mg via INTRAVENOUS

## 2020-02-02 MED ORDER — LIDOCAINE 2% (20 MG/ML) 5 ML SYRINGE
INTRAMUSCULAR | Status: AC
  Filled 2020-02-02: qty 5

## 2020-02-02 MED ORDER — 0.9 % SODIUM CHLORIDE (POUR BTL) OPTIME
TOPICAL | Status: DC | PRN
  Administered 2020-02-02: 1000 mL

## 2020-02-02 MED ORDER — GABAPENTIN 300 MG PO CAPS
300.0000 mg | ORAL_CAPSULE | ORAL | Status: AC
  Administered 2020-02-02: 300 mg via ORAL
  Filled 2020-02-02: qty 1

## 2020-02-02 MED ORDER — MIDAZOLAM HCL 5 MG/5ML IJ SOLN
INTRAMUSCULAR | Status: DC | PRN
  Administered 2020-02-02: 2 mg via INTRAVENOUS

## 2020-02-02 MED ORDER — SODIUM CHLORIDE (PF) 0.9 % IJ SOLN
INTRAMUSCULAR | Status: DC | PRN
  Administered 2020-02-02: 10 mL

## 2020-02-02 MED ORDER — KETOROLAC TROMETHAMINE 30 MG/ML IJ SOLN
INTRAMUSCULAR | Status: AC
  Filled 2020-02-02: qty 1

## 2020-02-02 MED ORDER — EPHEDRINE SULFATE-NACL 50-0.9 MG/10ML-% IV SOSY
PREFILLED_SYRINGE | INTRAVENOUS | Status: DC | PRN
  Administered 2020-02-02: 10 mg via INTRAVENOUS

## 2020-02-02 MED ORDER — LIDOCAINE 2% (20 MG/ML) 5 ML SYRINGE
INTRAMUSCULAR | Status: DC | PRN
  Administered 2020-02-02: 100 mg via INTRAVENOUS

## 2020-02-02 MED ORDER — PANTOPRAZOLE SODIUM 40 MG IV SOLR
40.0000 mg | Freq: Every day | INTRAVENOUS | Status: DC
  Administered 2020-02-02 – 2020-02-03 (×2): 40 mg via INTRAVENOUS
  Filled 2020-02-02 (×2): qty 40

## 2020-02-02 MED ORDER — KETAMINE HCL 10 MG/ML IJ SOLN
INTRAMUSCULAR | Status: AC
  Filled 2020-02-02: qty 1

## 2020-02-02 MED ORDER — HYDROMORPHONE HCL 1 MG/ML IJ SOLN
0.2500 mg | INTRAMUSCULAR | Status: DC | PRN
  Administered 2020-02-02: 0.5 mg via INTRAVENOUS

## 2020-02-02 MED ORDER — SCOPOLAMINE 1 MG/3DAYS TD PT72
1.0000 | MEDICATED_PATCH | TRANSDERMAL | Status: DC
  Administered 2020-02-02: 1.5 mg via TRANSDERMAL
  Filled 2020-02-02: qty 1

## 2020-02-02 MED ORDER — SUGAMMADEX SODIUM 500 MG/5ML IV SOLN
INTRAVENOUS | Status: DC | PRN
  Administered 2020-02-02: 300 mg via INTRAVENOUS

## 2020-02-02 MED ORDER — FENTANYL CITRATE (PF) 100 MCG/2ML IJ SOLN
INTRAMUSCULAR | Status: DC | PRN
Start: 2020-02-02 — End: 2020-02-02
  Administered 2020-02-02 (×6): 50 ug via INTRAVENOUS

## 2020-02-02 MED ORDER — ORAL CARE MOUTH RINSE
15.0000 mL | Freq: Once | OROMUCOSAL | Status: AC
  Administered 2020-02-02: 15 mL via OROMUCOSAL

## 2020-02-02 MED ORDER — ACETAMINOPHEN 160 MG/5ML PO SOLN
1000.0000 mg | Freq: Three times a day (TID) | ORAL | Status: DC
  Filled 2020-02-02: qty 40.6

## 2020-02-02 MED ORDER — OXYCODONE HCL 5 MG/5ML PO SOLN
5.0000 mg | Freq: Four times a day (QID) | ORAL | Status: DC | PRN
  Administered 2020-02-02 – 2020-02-04 (×4): 5 mg via ORAL
  Filled 2020-02-02 (×4): qty 5

## 2020-02-02 SURGICAL SUPPLY — 45 items
APPLIER CLIP ROT 10 11.4 M/L (STAPLE)
CABLE HIGH FREQUENCY MONO STRZ (ELECTRODE) IMPLANT
CLAMP ENDO BABCK 10MM (STAPLE) IMPLANT
CLIP APPLIE ROT 10 11.4 M/L (STAPLE) IMPLANT
COVER SURGICAL LIGHT HANDLE (MISCELLANEOUS) ×2 IMPLANT
COVER WAND RF STERILE (DRAPES) IMPLANT
DERMABOND ADVANCED (GAUZE/BANDAGES/DRESSINGS) ×1
DERMABOND ADVANCED .7 DNX12 (GAUZE/BANDAGES/DRESSINGS) ×1 IMPLANT
DEVICE SUT QUICK LOAD TK 5 (STAPLE) ×16 IMPLANT
DEVICE SUT TI-KNOT TK 5X26 (MISCELLANEOUS) ×2 IMPLANT
DEVICE SUTURE ENDOST 10MM (ENDOMECHANICALS) ×2 IMPLANT
DISSECTOR BLUNT TIP ENDO 5MM (MISCELLANEOUS) ×2 IMPLANT
DRAIN PENROSE 0.5X18 (DRAIN) ×2 IMPLANT
ELECT L-HOOK LAP 45CM DISP (ELECTROSURGICAL) ×2
ELECT REM PT RETURN 15FT ADLT (MISCELLANEOUS) ×2 IMPLANT
ELECTRODE L-HOOK LAP 45CM DISP (ELECTROSURGICAL) ×1 IMPLANT
GLOVE BIOGEL M 8.0 STRL (GLOVE) ×2 IMPLANT
GOWN STRL REUS W/TWL XL LVL3 (GOWN DISPOSABLE) ×6 IMPLANT
GRASPER ENDO BABCOCK 10 (MISCELLANEOUS) IMPLANT
GRASPER ENDO BABCOCK 10MM (MISCELLANEOUS)
KIT BASIN OR (CUSTOM PROCEDURE TRAY) ×2 IMPLANT
KIT TURNOVER KIT A (KITS) ×2 IMPLANT
PAD POSITIONING PINK XL (MISCELLANEOUS) ×2 IMPLANT
PENCIL SMOKE EVACUATOR (MISCELLANEOUS) IMPLANT
PROTECTOR NERVE ULNAR (MISCELLANEOUS) IMPLANT
SCISSORS LAP 5X45 EPIX DISP (ENDOMECHANICALS) ×2 IMPLANT
SET IRRIG TUBING LAPAROSCOPIC (IRRIGATION / IRRIGATOR) ×2 IMPLANT
SET TUBE SMOKE EVAC HIGH FLOW (TUBING) ×2 IMPLANT
SHEARS HARMONIC ACE PLUS 45CM (MISCELLANEOUS) ×2 IMPLANT
SLEEVE ADV FIXATION 5X100MM (TROCAR) ×4 IMPLANT
STAPLER VISISTAT 35W (STAPLE) ×2 IMPLANT
SUT MNCRL AB 4-0 PS2 18 (SUTURE) ×4 IMPLANT
SUT SURGIDAC NAB ES-9 0 48 120 (SUTURE) ×16 IMPLANT
SUT VIC AB 4-0 SH 18 (SUTURE) IMPLANT
TAPE CLOTH 4X10 WHT NS (GAUZE/BANDAGES/DRESSINGS) IMPLANT
TIP INNERVISION DETACH 40FR (MISCELLANEOUS) IMPLANT
TIP INNERVISION DETACH 50FR (MISCELLANEOUS) IMPLANT
TIP INNERVISION DETACH 56FR (MISCELLANEOUS) IMPLANT
TIPS INNERVISION DETACH 40FR (MISCELLANEOUS)
TOWEL OR 17X26 10 PK STRL BLUE (TOWEL DISPOSABLE) ×2 IMPLANT
TOWEL OR NON WOVEN STRL DISP B (DISPOSABLE) ×2 IMPLANT
TRAY LAPAROSCOPIC (CUSTOM PROCEDURE TRAY) ×2 IMPLANT
TROCAR ADV FIXATION 11X100MM (TROCAR) ×2 IMPLANT
TROCAR ADV FIXATION 5X100MM (TROCAR) ×2 IMPLANT
TROCAR BLADELESS OPT 5 100 (ENDOMECHANICALS) ×2 IMPLANT

## 2020-02-02 NOTE — Anesthesia Procedure Notes (Signed)
Procedure Name: Intubation Date/Time: 02/02/2020 7:43 AM Performed by: Montel Clock, CRNA Pre-anesthesia Checklist: Patient identified, Emergency Drugs available, Suction available, Patient being monitored and Timeout performed Patient Re-evaluated:Patient Re-evaluated prior to induction Oxygen Delivery Method: Circle system utilized Preoxygenation: Pre-oxygenation with 100% oxygen Induction Type: IV induction and Rapid sequence Laryngoscope Size: Mac and 3 Grade View: Grade I Tube type: Oral Tube size: 7.0 mm Number of attempts: 1 Airway Equipment and Method: Stylet Placement Confirmation: ETT inserted through vocal cords under direct vision,  positive ETCO2 and breath sounds checked- equal and bilateral Secured at: 21 cm Tube secured with: Tape Dental Injury: Teeth and Oropharynx as per pre-operative assessment

## 2020-02-02 NOTE — Op Note (Signed)
Kimberly Patton  Aug 17, 1955   02/02/2020    PCP:  Jinny Sanders, MD   Surgeon: Kaylyn Lim, MD, FACS  Asst:  Alphonsa Overall, MD, FACS  Anes:  general  Preop Dx: Prior sleeve gastrectomy with hiatal hernia (dysphagia, GERD) Postop Dx: Post takedown of hernia and anterior pexy  Procedure: Laparoscopic takedown of adhesions to prior Gore-Tex mesh, dissection and takedown of hiatal hernia with posterior closure using 5 interrupted sutures of [place] and tie knots.  Endoscopy by Dr. Lucia Gaskins Location Surgery: OR 1 at [facility] Complications: None noted     EBL:   Minimal cc  Drains: None  Description of Procedure:  The patient was taken to OR 1.  After anesthesia was administered and the patient was prepped  with technique care and a timeout was performed.  Access to the abdomen was achieved to the left upper quadrant with a 5 mm Optiview trocar.  The patient had numerous adhesions of the small bowel from a prior extensive ventral hernia repair that was done down in [place] by Dr. Jama Flavors.  We placed trocar in the upper abdomen but will eventually be a site for the [facility] retractor and through that began to take down adhesions from superior to the inferior along where the falciform was in adhered to the mesh superiorly.  There were adhesions to the titanium tacks that have been used to help insert this.  I was able to create clear out enough space to place working ports in a standard fashion with a 1011 to the right of the midline 5 laterally.  We were also able to do a 30cc Exparel block on either side total.  We then went up on the sleeve and began taking down the obvious sac and adhesions he is in harmonic scalpel.  Once had freed up the reflections I then got beneath it we put a Penrose drain and then I went up into the chest and teased away the visible sac.  Dr. Lucia Gaskins passed the endoscope and we looked at the EG junction and this was a little above our hiatus so we worked further  posteriorly into the left side further free the esophagus and stomach until the stomach was in the abdomen.  The hiatus was then closed with 5 sutures of Surgidek using tie knots placed posteriorly.  We rescoped and looked in it appeared that the EG junction was below this repair.  I took some of the redundant reflection from the prior hernia sac and tacked it up to the insertion of the left lateral segment of liver and secured that with a tie knot.  We surveyed for any occult enterotomies did not see anything.  No bleeding was noted.  The abdomen was deflated and then the wounds were closed with 4-0 Monocryl and Dermabond  The patient tolerated the procedure well and was taken to the PACU in stable condition.     [name] Done, [place], [facility], [place]

## 2020-02-02 NOTE — Interval H&P Note (Signed)
History and Physical Interval Note:  02/02/2020 7:27 AM  Kimberly Patton  has presented today for surgery, with the diagnosis of TYPE III HIATAL HERNIA.  The various methods of treatment have been discussed with the patient and family. After consideration of risks, benefits and other options for treatment, the patient has consented to  Procedure(s): [place] (N/A) as a surgical intervention.  The patient's history has been reviewed, patient examined, no change in status, stable for surgery.  I have reviewed the patient's chart and labs.  Questions were answered to the patient's satisfaction.     [name]

## 2020-02-02 NOTE — Transfer of Care (Signed)
Immediate Anesthesia Transfer of Care Note  Patient: Kimberly Patton  Procedure(s) Performed: LAPAROSCOPIC REPAIR LARGE SYMPTOMATIC HIATAL HERNIA WITH UPPER ENDOSCOPY (N/A Abdomen)  Patient Location: PACU  Anesthesia Type:General  Level of Consciousness: awake, oriented, patient cooperative and responds to stimulation  Airway & Oxygen Therapy: Patient Spontanous Breathing and Patient connected to face mask oxygen  Post-op Assessment: Report given to RN and Post -op Vital signs reviewed and stable  Post vital signs: Reviewed and stable  Last Vitals:  Vitals Value Taken Time  BP 148/83 02/02/20 1039  Temp    Pulse 85 02/02/20 1043  Resp 25 02/02/20 1043  SpO2 100 % 02/02/20 1043  Vitals shown include unvalidated device data.  Last Pain:  Vitals:   02/02/20 0603  TempSrc: Oral  PainSc:       Patients Stated Pain Goal: 4 (53/00/51 1021)  Complications: No complications documented.

## 2020-02-03 ENCOUNTER — Inpatient Hospital Stay (HOSPITAL_COMMUNITY): Payer: BC Managed Care – PPO

## 2020-02-03 ENCOUNTER — Encounter (HOSPITAL_COMMUNITY): Payer: Self-pay | Admitting: Surgery

## 2020-02-03 LAB — CBC WITH DIFFERENTIAL/PLATELET
Abs Immature Granulocytes: 0.03 10*3/uL (ref 0.00–0.07)
Basophils Absolute: 0 10*3/uL (ref 0.0–0.1)
Basophils Relative: 0 %
Eosinophils Absolute: 0 10*3/uL (ref 0.0–0.5)
Eosinophils Relative: 0 %
HCT: 31.8 % — ABNORMAL LOW (ref 36.0–46.0)
Hemoglobin: 9.9 g/dL — ABNORMAL LOW (ref 12.0–15.0)
Immature Granulocytes: 0 %
Lymphocytes Relative: 16 %
Lymphs Abs: 1.2 10*3/uL (ref 0.7–4.0)
MCH: 26.3 pg (ref 26.0–34.0)
MCHC: 31.1 g/dL (ref 30.0–36.0)
MCV: 84.4 fL (ref 80.0–100.0)
Monocytes Absolute: 0.5 10*3/uL (ref 0.1–1.0)
Monocytes Relative: 7 %
Neutro Abs: 5.7 10*3/uL (ref 1.7–7.7)
Neutrophils Relative %: 77 %
Platelets: 239 10*3/uL (ref 150–400)
RBC: 3.77 MIL/uL — ABNORMAL LOW (ref 3.87–5.11)
RDW: 18.4 % — ABNORMAL HIGH (ref 11.5–15.5)
WBC: 7.5 10*3/uL (ref 4.0–10.5)
nRBC: 0 % (ref 0.0–0.2)

## 2020-02-03 MED ORDER — IOHEXOL 300 MG/ML  SOLN
150.0000 mL | Freq: Once | INTRAMUSCULAR | Status: AC | PRN
  Administered 2020-02-03: 75 mL via ORAL

## 2020-02-03 NOTE — Progress Notes (Signed)
Patient ID: Kimberly Patton, female   DOB: 08/14/1955, 64 y.o.   MRN: [id number] [facility] Progress Note:   1 Day Post-Op  Subjective: Mental status is clear.  Complaints feeling good. Objective: Vital signs in last 24 hours: Temp:  [97.4 F (36.3 C)-98.3 F (36.8 C)] 98 F (36.7 C) (08/25 0945) Pulse Rate:  [57-89] 57 (08/25 0945) Resp:  [9-18] 16 (08/25 0611) BP: (126-155)/(77-95) 128/77 (08/25 0945) SpO2:  [96 %-100 %] 100 % (08/25 0945)  Intake/Output from previous day: 08/24 0701 - 08/25 0700 In: 4052.6 [P.O.:1480; I.V.:2472.6; IV Piggyback:100] Out: 1620 [Urine:1600; Blood:20] Intake/Output this shift: Total I/O In: 240 [P.O.:240] Out: 300 [Urine:300]  Physical Exam: Work of breathing is normal  Lab Results:  Results for orders placed or performed during the hospital encounter of 02/02/20 (from the past 48 hour(s))  CBC     Status: Abnormal   Collection Time: 02/02/20  2:17 PM  Result Value Ref Range   WBC 12.6 (H) 4.0 - 10.5 K/uL   RBC 4.62 3.87 - 5.11 MIL/uL   Hemoglobin 12.2 12.0 - 15.0 g/dL   HCT 39.5 36 - 46 %   MCV 85.5 80.0 - 100.0 fL   MCH 26.4 26.0 - 34.0 pg   MCHC 30.9 30.0 - 36.0 g/dL   RDW 18.3 (H) 11.5 - 15.5 %   Platelets 256 150 - 400 K/uL   nRBC 0.0 0.0 - 0.2 %    Comment: Performed at [facility], [place] [address] [place], [place] [address]  Creatinine, serum     Status: None   Collection Time: 02/02/20  2:17 PM  Result Value Ref Range   Creatinine, Ser 0.50 0.44 - 1.00 mg/dL   GFR calc non Af Amer >60 >60 mL/min   GFR calc Af Amer >60 >60 mL/min    Comment: Performed at [facility], [place] [address], [place], [place] [address]  CBC WITH DIFFERENTIAL     Status: Abnormal   Collection Time: 02/03/20  5:00 AM  Result Value Ref Range   WBC 7.5 4.0 - 10.5 K/uL   RBC 3.77 (L) 3.87 - 5.11 MIL/uL   Hemoglobin 9.9 (L) 12.0 - 15.0 g/dL   HCT 31.8 (L) 36 - 46 %   MCV 84.4 80.0 - 100.0 fL   MCH  26.3 26.0 - 34.0 pg   MCHC 31.1 30.0 - 36.0 g/dL   RDW 18.4 (H) 11.5 - 15.5 %   Platelets 239 150 - 400 K/uL   nRBC 0.0 0.0 - 0.2 %   Neutrophils Relative % 77 %   Neutro Abs 5.7 1.7 - 7.7 K/uL   Lymphocytes Relative 16 %   Lymphs Abs 1.2 0.7 - 4.0 K/uL   Monocytes Relative 7 %   Monocytes Absolute 0.5 0 - 1 K/uL   Eosinophils Relative 0 %   Eosinophils Absolute 0.0 0 - 0 K/uL   Basophils Relative 0 %   Basophils Absolute 0.0 0 - 0 K/uL   Immature Granulocytes 0 %   Abs Immature Granulocytes 0.03 0.00 - 0.07 K/uL    Comment: Performed at [facility], [place] [address], [place], [place] [address]    Radiology/Results: DG UGI W SINGLE CM (SOL OR THIN BA)  Result Date: 02/03/2020 CLINICAL DATA:  Hiatal hernia repair yesterday. EXAM: WATER SOLUBLE UPPER GI SERIES TECHNIQUE: Single-column upper GI series was performed using water soluble contrast. CONTRAST:  52mL OMNIPAQUE IOHEXOL 300 MG/ML  SOLN COMPARISON:  None. FLUOROSCOPY TIME:  Fluoroscopy Time:  1 minutes 9 seconds Radiation Exposure Index (if provided by the fluoroscopic device): 57.3 mGy Number of Acquired Spot Images: 7 FINDINGS: Scout view of the abdomen shows postoperative changes in the epigastric region. Gas is seen in minimally prominent small bowel and colon. Surgical clips in the right upper quadrant. Ventral hernia repair. Calcified uterine fibroid. Patient drank water soluble contrast from a cup. Hiatal hernia repair without evidence of leak. IMPRESSION: 1. No leak. 2. Suspect a mild ileus. Electronically Signed   By: Lorin Picket M.D.   On: 02/03/2020 09:32    Anti-infectives: Anti-infectives (From admission, onward)   Start     Dose/Rate Route Frequency Ordered Stop   02/02/20 0600  cefoTEtan (CEFOTAN) 2 g in sodium chloride 0.9 % 100 mL IVPB        2 g 200 mL/hr over 30 Minutes Intravenous On call to O.R. 02/02/20 0529 02/02/20 0755      Assessment/Plan: Problem List: Patient Active Problem  List   Diagnosis Date Noted  . History of repair of hiatal hernia 02/02/2020  . Bradycardia 01/01/2020  . Orthostatic hypotension 01/01/2020  . Globus sensation 11/23/2019  . Sore throat 10/22/2019  . Elevated lipase 08/27/2019  . Abdominal pain, epigastric 08/11/2019  . Hypercholesteremia 06/09/2019  . Neck swelling 02/14/2019  . Ear pain, right 09/12/2018  . Chronic neuropathic pain 07/31/2018  . Thyroid nodule 04/15/2018  . Skin lesion of scalp 09/06/2017  . Hypokalemia 06/08/2017  . Iron deficiency anemia 06/08/2017  . Osteoporosis 12/04/2016  . Osteoarthritis of knee 07/17/2016  . Numbness and tingling of both feet 01/27/2016  . Meningioma ([place]) 01/18/2016  . Family history of aortic stenosis 01/20/2015  . Status post laparoscopic sleeve gastrectomy Dec 2014 06/08/2013  . Morbid obesity ([place]) 12/08/2012  . HTN (hypertension) 10/11/2011  . Varicose veins of lower extremities with other complications 71/21/9758  . FIBROIDS, UTERUS 04/03/2007  . Allergic rhinitis 04/03/2007  . Asthma, mild intermittent 04/03/2007    UGI looks OK.  Advance diet and hopeful discharge tomorrow.   1 Day Post-Op    LOS: 1 day   [name] Done, MD, [facility] [phone] to reach the surgeon on call.    02/03/2020 10:10 AM

## 2020-02-03 NOTE — Telephone Encounter (Signed)
Call to patient to review stress test results.    No answer.left detailed message with results.    Advised pt to call for any further questions or concerns.  No further orders.

## 2020-02-04 ENCOUNTER — Ambulatory Visit: Payer: BC Managed Care – PPO | Admitting: Cardiovascular Disease

## 2020-02-04 LAB — CBC WITH DIFFERENTIAL/PLATELET
Abs Immature Granulocytes: 0.01 10*3/uL (ref 0.00–0.07)
Basophils Absolute: 0 10*3/uL (ref 0.0–0.1)
Basophils Relative: 1 %
Eosinophils Absolute: 0.2 10*3/uL (ref 0.0–0.5)
Eosinophils Relative: 3 %
HCT: 34.9 % — ABNORMAL LOW (ref 36.0–46.0)
Hemoglobin: 10.8 g/dL — ABNORMAL LOW (ref 12.0–15.0)
Immature Granulocytes: 0 %
Lymphocytes Relative: 36 %
Lymphs Abs: 2.2 10*3/uL (ref 0.7–4.0)
MCH: 26.5 pg (ref 26.0–34.0)
MCHC: 30.9 g/dL (ref 30.0–36.0)
MCV: 85.7 fL (ref 80.0–100.0)
Monocytes Absolute: 0.4 10*3/uL (ref 0.1–1.0)
Monocytes Relative: 7 %
Neutro Abs: 3.2 10*3/uL (ref 1.7–7.7)
Neutrophils Relative %: 53 %
Platelets: 213 10*3/uL (ref 150–400)
RBC: 4.07 MIL/uL (ref 3.87–5.11)
RDW: 18.7 % — ABNORMAL HIGH (ref 11.5–15.5)
WBC: 6 10*3/uL (ref 4.0–10.5)
nRBC: 0 % (ref 0.0–0.2)

## 2020-02-04 MED ORDER — HYDROCODONE-ACETAMINOPHEN 5-325 MG PO TABS: 1.0000 | ORAL_TABLET | Freq: Four times a day (QID) | ORAL | 0 refills | Status: DC | PRN

## 2020-02-04 MED FILL — HYDROCODON-APAP 5-325: 5-325 | 3 days supply | Qty: 15 | Fill #0

## 2020-02-04 NOTE — Progress Notes (Signed)
Provided Phase II diet information to patient.  [place] outpatient appointment made for follow up for "back on track" education.  Discussed Vitamin supplementation with patient.  Concerns expressed by patient regarding Iron supplementation.  Discussed with Dr Hassell Done, recommendations was to continue bari multivitamin and iron supplementation.  Reminded to call for post appointment with office and this information was placed on AVS as reminder.  Questions answered

## 2020-02-04 NOTE — Discharge Summary (Signed)
Physician Discharge Summary  Patient ID: Kimberly Patton MRN: [id number] DOB/AGE: 06/22/1955 64 y.o.  PCP: Jinny Sanders, MD  Admit date: 02/02/2020 Discharge date: 02/04/2020  Admission Diagnoses:  GERD and prior sleeve gastrectomy with hiatal hernia  Discharge Diagnoses:  repair of hiatal hernia  Active Problems:   History of repair of hiatal hernia   Surgery:  lap enterolysis  Discharged Condition: improved  Hospital Course:   had surgery and was kept overnight and liquids were advanced until she was ready for discharge.  This included 2 nights in the hospital  Consults: none  Significant Diagnostic Studies: none    Discharge Exam: Blood pressure 135/75, pulse (!) 59, temperature 98.4 F (36.9 C), temperature source Oral, resp. rate 18, height 5\' 6"  (1.676 m), weight 113.4 kg, SpO2 99 %. Incisions healing OK  Disposition: Discharge disposition: 01-Home or Self Care       Discharge Instructions     Call MD for:  redness, tenderness, or signs of infection (pain, swelling, redness, odor or green/yellow discharge around incision site)   Complete by: As directed    Diet - low sodium heart healthy   Complete by: As directed    Increase activity slowly   Complete by: As directed       Allergies as of 02/04/2020       Reactions   Wasp Venom Anaphylaxis   Chlorhexidine Gluconate    Itching under breasts   Prednisone Other (See Comments)   Severe migraine / with taper dose   Sodium Hypochlorite Rash   Liquid clorox bleach        Medication List     TAKE these medications    acetaminophen 500 MG tablet Commonly known as: TYLENOL Take 1,000 mg by mouth every 6 (six) hours as needed (for pain.).   albuterol 108 (90 Base) MCG/ACT inhaler Commonly known as: ProAir HFA TAKE 2 PUFFS BY MOUTH EVERY 6 HOURS AS NEEDED   CITRACAL + D PO Take 2 tablets by mouth daily.   diltiazem 120 MG 24 hr capsule Commonly known as: CARDIZEM CD Take 1 capsule (120 mg  total) by mouth daily.   ELDERBERRY PO Take 1,000 mg by mouth daily.   famotidine 20 MG tablet Commonly known as: PEPCID Take 20 mg by mouth 2 (two) times daily.   ferrous sulfate 325 (65 FE) MG EC tablet Take 1 tablet (325 mg total) by mouth 2 (two) times daily with a meal. What changed: when to take this   Fish Oil 1200 MG Caps Take 1,200 mg by mouth daily.   fluticasone 50 MCG/ACT nasal spray Commonly known as: FLONASE SPRAY 2 SPRAYS INTO EACH NOSTRIL EVERY DAY What changed: See the new instructions.   HYDROcodone-acetaminophen 5-325 MG tablet Commonly known as: NORCO/VICODIN Take 1 tablet by mouth every 6 (six) hours as needed for moderate pain.   lisinopril 10 MG tablet Commonly known as: ZESTRIL Take 1 tablet (10 mg total) by mouth daily.   multivitamin with minerals Tabs tablet Take 2 tablets by mouth daily. Multivitamin for Women   nitroGLYCERIN 0.4 MG SL tablet Commonly known as: NITROSTAT Place 1 tablet (0.4 mg total) under the tongue every 5 (five) minutes as needed for chest pain.   pantoprazole 40 MG tablet Commonly known as: PROTONIX TAKE 1 TABLET BY MOUTH TWICE A DAY What changed: additional instructions   potassium chloride SA 20 MEQ tablet Commonly known as: KLOR-CON Take 1 tablet (20 mEq total) by mouth daily.  sucralfate 1 GM/10ML suspension Commonly known as: CARAFATE Take 1 g by mouth 2 (two) times daily.   sucralfate 1 g tablet Commonly known as: CARAFATE Take 1 g by mouth 2 (two) times daily.   traMADol 50 MG tablet Commonly known as: ULTRAM Take 1 tablet (50 mg total) by mouth every 8 (eight) hours as needed. What changed: reasons to take this   TURMERIC PO Take 1 capsule by mouth daily.   Vitamin B-12 5000 MCG Subl Place 5,000 mcg under the tongue daily.   vitamin C 250 MG tablet Commonly known as: ASCORBIC ACID Take 250 mg by mouth daily.        Follow-up Information     Johnathan Hausen, MD. Call in 2 week(s).    Specialty: General Surgery Contact information: [place] [address] [phone]                 Signed: [name] 02/04/2020, 11:47 AM

## 2020-02-04 NOTE — Progress Notes (Signed)
Nutrition Education Note  Received consult for diet education for patient s/p Laparoscopic takedown of adhesions to prior Gore-Tex mesh, dissection and takedown of hiatal hernia with posterior closure using 5 interrupted sutures of [place] and tie knots.  Endoscopy   Pt with history of gastric sleeve surgery in 2014. Pt familiar with dietary expectations. Pt was taking vitamins and calcium PTA.  Discussed 2 week post op diet with pt. Emphasized that liquids must be non carbonated, non caffeinated, and sugar free. Fluid goals discussed. Pt to follow up with outpatient bariatric RD for further diet progression after 2 weeks. Multivitamins and minerals also reviewed. Teach back method used, pt expressed understanding, expect good compliance.   Diet: First 2 Weeks  You will see the dietitian about two (2) weeks after your surgery. The dietitian will increase the types of foods you can eat if you are handling liquids well:  If you have severe vomiting or nausea and cannot handle clear liquids lasting longer than 1 day, call your surgeon  Protein Shake  Drink at least 2 ounces of shake 5-6 times per day  Each serving of protein shakes (usually 8 - 12 ounces) should have a minimum of:  15 grams of protein  And no more than 5 grams of carbohydrate  Goal for protein each day:  Men = 80 grams per day  Women = 60 grams per day  Protein powder may be added to fluids such as non-fat milk or Lactaid milk or Soy milk (limit to 35 grams added protein powder per serving)   Hydration  Slowly increase the amount of water and other clear liquids as tolerated (See Acceptable Fluids)  Slowly increase the amount of protein shake as tolerated  Sip fluids slowly and throughout the day  May use sugar substitutes in small amounts (no more than 6 - 8 packets per day; i.e. Splenda)   Fluid Goal  The first goal is to drink at least 8 ounces of protein shake/drink per day (or as directed by the nutritionist); some  examples of protein shakes are Premier Protein, Johnson [employer] Johnson, AMR Corporation, EAS Edge HP, and Unjury. See handout from pre-op Bariatric Education Class:  Slowly increase the amount of protein shake you drink as tolerated  You may find it easier to slowly sip shakes throughout the day  It is important to get your proteins in first  Your fluid goal is to drink 64 - 100 ounces of fluid daily  It may take a few weeks to build up to this  32 oz (or more) should be clear liquids  And  32 oz (or more) should be full liquids (see below for examples)  Liquids should not contain sugar, caffeine, or carbonation   Clear Liquids:  Water or Sugar-free flavored water (i.e. Fruit H2O, Propel)  Decaffeinated coffee or tea (sugar-free)  Crystal Lite, Wyler's Lite, Minute Maid Lite  Sugar-free Jell-O  Bouillon or broth  Sugar-free Popsicle: *Less than 20 calories each; Limit 1 per day   Full Liquids:  Protein Shakes/Drinks + 2 choices per day of other full liquids  Full liquids must be:  No More Than 12 grams of Carbs per serving  No More Than 3 grams of Fat per serving  Strained low-fat cream soup  Non-Fat milk  Fat-free Lactaid Milk  Sugar-free yogurt (Dannon Lite & Fit, [place] yogurt, Oikos Zero)   Clayton Bibles, MS, RD, LDN Inpatient Clinical Dietitian Contact information available via Amion

## 2020-02-04 NOTE — Progress Notes (Signed)
D/c instructions given to patient. Patient had no questions. NT or writer will wheel patient once her ride comes in

## 2020-02-05 ENCOUNTER — Telehealth: Payer: Self-pay

## 2020-02-05 MED ORDER — LOSARTAN POTASSIUM 25 MG PO TABS: 25.0000 mg | ORAL_TABLET | Freq: Every day | ORAL | 3 refills | Status: DC

## 2020-02-05 NOTE — Telephone Encounter (Signed)
Called patient and gave instructions per Dr. Garen Lah as follows:  Yes, there is another option.    Please stop lisinopril, start losartan 25 mg daily.    Keep follow-up appointment.    Original communication was in a Pt request for advice through [facility].

## 2020-02-09 ENCOUNTER — Other Ambulatory Visit: Payer: BC Managed Care – PPO

## 2020-02-09 NOTE — Anesthesia Postprocedure Evaluation (Signed)
Anesthesia Post Note  Patient: Kimberly Patton  Procedure(s) Performed: LAPAROSCOPIC REPAIR LARGE SYMPTOMATIC HIATAL HERNIA WITH UPPER ENDOSCOPY (N/A Abdomen)     Patient location during evaluation: PACU Anesthesia Type: General Level of consciousness: awake and sedated Pain management: pain level controlled Vital Signs Assessment: post-procedure vital signs reviewed and stable Respiratory status: spontaneous breathing Cardiovascular status: stable Postop Assessment: no apparent nausea or vomiting Anesthetic complications: no   No complications documented.  Last Vitals:  Vitals:   02/04/20 0429 02/04/20 1013  BP: (!) 123/59 135/75  Pulse: 70 (!) 59  Resp: 16 18  Temp: 36.5 C 36.9 C  SpO2: 98% 99%    Last Pain:  Vitals:   02/04/20 1013  TempSrc: Oral  PainSc:                  Huston Foley

## 2020-02-11 ENCOUNTER — Other Ambulatory Visit: Payer: Self-pay

## 2020-02-11 ENCOUNTER — Ambulatory Visit: Payer: BC Managed Care – PPO | Admitting: Cardiology

## 2020-02-11 ENCOUNTER — Encounter: Payer: Self-pay | Admitting: Cardiology

## 2020-02-11 VITALS — BP 124/82 | HR 54 | Ht 66.0 in | Wt 243.0 lb

## 2020-02-11 DIAGNOSIS — R079 Chest pain, unspecified: Secondary | ICD-10-CM | POA: Diagnosis not present

## 2020-02-11 DIAGNOSIS — I10 Essential (primary) hypertension: Secondary | ICD-10-CM

## 2020-02-11 DIAGNOSIS — I471 Supraventricular tachycardia: Secondary | ICD-10-CM | POA: Diagnosis not present

## 2020-02-11 NOTE — Progress Notes (Signed)
Cardiology Office Note:    Date:  02/11/2020   ID:  Kimberly Patton, DOB 09/01/1955, MRN [id number]  PCP:  Jinny Sanders, MD  [facility] Cardiologist:  [name], MD  [place] Electrophysiologist:  None   Referring MD: Jinny Sanders, MD   No chief complaint on file.   History of Present Illness:    Kimberly Patton is a 64 y.o. female with a hx of hypertension, hiatal hernia, GERD, iron deficiency anemia, SVT on diltiazem presenting for follow-up.  Patient last seen due to chest pain occurring over several months.  Due to risk factors, echo and Lexiscan Myoview ordered.  Also had hypokalemia and HCTZ stopped after last visit, losartan started.  Patient has a successful hiatal hernia surgery.  Currently tolerating clear liquids.  Denies any dizziness, shortness of breath with losartan.  Prior notes Previously seen by cardiology for tachycardia and palpitations.  Diagnosed with SVT in the past started on calcium channel blockers.  Work-up with echocardiogram showed normal systolic function, EF 55 to 60% and Lexiscan stress test in 2013  was negative for ischemia.  She was noted to have bradycardia, diltiazem dose was reduced.   Past Medical History:  Diagnosis Date  . ALLERGIC RHINITIS 04/03/2007  . Anemia   . Anxiety   . Asthma    hx of years ago - no longer a problem   . ASTHMA, PERSISTENT, MODERATE 04/03/2007  . Cancer ([place])    skin   . Dyspnea on exertion    a. 11/2007 Echo: EF 60%.  . Dysrhythmia    hx of heart arrhythmia 10-15 years ago - followed by DR Lovena Le - not seen in years   . GERD (gastroesophageal reflux disease)   . 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 Hypertension   . Joint pain    HIPS / LEGS  . Knee injury    RT  . Meningioma ([place])   . Morbid obesity ([place])   . Paroxysmal SVT (supraventricular tachycardia) ([place])    a. 11/2011 48h Holter: RSR, rare PVC's, occas PAC's  . Retinal tear of right eye 01/2016  . Ventral hernia     Past Surgical History:   Procedure Laterality Date  . APPENDECTOMY  1996  . BREAST CYST EXCISION Right 1994   Benign  . CESAREAN SECTION     x2  . CHOLECYSTECTOMY  1995  . EYE SURGERY  01/2016   Repair retinal tear  . [place] / 1996   x2  . KNEE ARTHROSCOPY W/ MENISCAL REPAIR  07/25/13   right knee Dr. Mardelle Matte  . LAPAROSCOPIC GASTRIC SLEEVE RESECTION N/A 06/08/2013   Procedure: LAPAROSCOPIC GASTRIC SLEEVE RESECTION AND EXCISION OF SEBACEUS CYST FROM MID CHEST takedown of incarcerated ventral hernia and primary repair endoscopy;  Surgeon: [name], MD;  Location: [facility];  Service: General;  Laterality: N/A;  . LAPAROSCOPIC NISSEN FUNDOPLICATION N/A 01/18/9832   Procedure: LAPAROSCOPIC REPAIR LARGE SYMPTOMATIC HIATAL HERNIA WITH UPPER ENDOSCOPY;  Surgeon: Johnathan Hausen, MD;  Location: [facility];  Service: General;  Laterality: N/A;  . moles  06/2013   removed 2 moles from under arm and  lowback  . NECK SURGERY     occipital nerve damage- injections   . OVARIAN CYST REMOVAL  1970  . PILONIDAL CYST EXCISION  1975  . RETINAL TEAR REPAIR CRYOTHERAPY Right 01/2016   [place]  . SKIN CANCER EXCISION    . TONSILLECTOMY    . UPPER GI ENDOSCOPY  06/08/2013   Procedure: UPPER GI ENDOSCOPY;  Surgeon: [name], MD;  Location: [facility];  Service: General;;  . US ECHOCARDIOGRAPHY  12/2011   WNL - EF 55-60%, mild MR, grade 1 diastolic dysnfiction (mild)  . VENTRAL HERNIA REPAIR  2000    Current Medications: Current Meds  Medication Sig  . acetaminophen (TYLENOL) 500 MG tablet Take 1,000 mg by mouth every 6 (six) hours as needed (for pain.).  Marland Kitchen albuterol (PROAIR HFA) 108 (90 Base) MCG/ACT inhaler TAKE 2 PUFFS BY MOUTH EVERY 6 HOURS AS NEEDED  . Calcium Citrate-Vitamin D (CITRACAL + D PO) Take 2 tablets by mouth daily.   . Cyanocobalamin (VITAMIN B-12) 5000 MCG SUBL Place 5,000 mcg under the tongue daily.  Marland Kitchen diltiazem (CARDIZEM CD) 120 MG 24 hr capsule Take 1 capsule (120 mg total) by mouth daily.    Marland Kitchen ELDERBERRY PO Take 1,000 mg by mouth daily.   . ferrous sulfate 325 (65 FE) MG EC tablet Take 1 tablet (325 mg total) by mouth 2 (two) times daily with a meal. (Patient taking differently: Take 325 mg by mouth daily. )  . fluticasone (FLONASE) 50 MCG/ACT nasal spray SPRAY 2 SPRAYS INTO EACH NOSTRIL EVERY DAY (Patient taking differently: Place 2 sprays into both nostrils daily. )  . HYDROcodone-acetaminophen (NORCO/VICODIN) 5-325 MG tablet Take 1 tablet by mouth every 6 (six) hours as needed for moderate pain.  Marland Kitchen losartan (COZAAR) 25 MG tablet Take 1 tablet (25 mg total) by mouth daily.  . Multiple Vitamin (MULTIVITAMIN WITH MINERALS) TABS tablet Take 2 tablets by mouth daily. Multivitamin for Women  . Multiple Vitamins-Minerals (OPURITY) TABS Take 1 tablet by mouth daily.  . nitroGLYCERIN (NITROSTAT) 0.4 MG SL tablet Place 1 tablet (0.4 mg total) under the tongue every 5 (five) minutes as needed for chest pain.  . Omega-3 Fatty Acids (FISH OIL) 1200 MG CAPS Take 1,200 mg by mouth daily.   . pantoprazole (PROTONIX) 40 MG tablet TAKE 1 TABLET BY MOUTH TWICE A DAY (Patient taking differently: Take 40 mg by mouth 2 (two) times daily. Morning & Mid-afternoon)  . traMADol (ULTRAM) 50 MG tablet Take 1 tablet (50 mg total) by mouth every 8 (eight) hours as needed. (Patient taking differently: Take 50 mg by mouth every 8 (eight) hours as needed (pain.). )  . TURMERIC PO Take 1 capsule by mouth daily.  . vitamin C (ASCORBIC ACID) 250 MG tablet Take 250 mg by mouth daily.   Current Facility-Administered Medications for the 02/11/20 encounter (Office Visit) with [name], MD  Medication  . methylPREDNISolone acetate (DEPO-MEDROL) injection 80 mg     Allergies:   Wasp venom, Chlorhexidine gluconate, Prednisone, and Sodium hypochlorite   Social History   Socioeconomic History  . Marital status: Divorced    Spouse name: Not on file  . Number of children: 2  . Years of education: 84  .  Highest education level: Not on file  Occupational History  . Occupation: Product manager: [employer]  Tobacco Use  . Smoking status: Never Smoker  . Smokeless tobacco: Never Used  . Tobacco comment: smoke at age [age]  Vaping Use  . Vaping Use: Never used  Substance and Sexual Activity  . Alcohol use: Yes    Alcohol/week: 0.0 standard drinks    Comment: Occasionally  . Drug use: No  . Sexual activity: Not on file  Other Topics Concern  . Not on file  Social History Narrative  Lives at home alone.   Right-handed.   Occasional caffeine use.   Social Determinants of Health   Financial Resource Strain:   . Difficulty of Paying Living Expenses: Not on file  Food Insecurity:   . Worried About Charity fundraiser in the Last Year: Not on file  . Ran Out of Food in the Last Year: Not on file  Transportation Needs:   . Lack of Transportation (Medical): Not on file  . Lack of Transportation (Non-Medical): Not on file  Physical Activity:   . Days of Exercise per Week: Not on file  . Minutes of Exercise per Session: Not on file  Stress:   . Feeling of Stress : Not on file  Social Connections:   . Frequency of Communication with Friends and Family: Not on file  . Frequency of Social Gatherings with Friends and Family: Not on file  . Attends Religious Services: Not on file  . Active Member of Clubs or Organizations: Not on file  . Attends Archivist Meetings: Not on file  . Marital Status: Not on file     Family History: The patient's family history includes Breast cancer in her mother; Diabetes in her father, mother, and another family member; Hemophilia in an other family member; Hypertension in her father, mother, and another family member; Kidney failure in her mother; Stroke in an other family member.  ROS:   Please see the history of present illness.     All other systems reviewed and are negative.  EKGs/Labs/Other Studies Reviewed:    The  following studies were reviewed today:   EKG:  EKG is  ordered today.  The ekg ordered today demonstrates sinus bradycardia  Recent Labs: 11/23/2019: TSH 0.66 01/10/2020: ALT 14; Magnesium 2.1 01/26/2020: BUN 12; Potassium 3.5; Sodium 138 02/02/2020: Creatinine, Ser 0.50 02/04/2020: Hemoglobin 10.8; Platelets 213  Recent Lipid Panel    Component Value Date/Time   CHOL 209 (H) 06/02/2019 0804   TRIG 111.0 06/02/2019 0804   HDL 63.10 06/02/2019 0804   CHOLHDL 3 06/02/2019 0804   VLDL 22.2 06/02/2019 0804   LDLCALC 124 (H) 06/02/2019 0804    Physical Exam:    VS:  BP 124/82   Pulse (!) 54   Ht 5\' 6"  (1.676 m)   Wt 243 lb (110.2 kg)   SpO2 100%   BMI 39.22 kg/m     Wt Readings from Last 3 Encounters:  02/11/20 243 lb (110.2 kg)  02/02/20 250 lb 1 oz (113.4 kg)  01/26/20 250 lb 1 oz (113.4 kg)     GEN:  Well nourished, well developed in no acute distress, obese HEENT: Normal NECK: No JVD; No carotid bruits LYMPHATICS: No lymphadenopathy CARDIAC: RRR, no murmurs, rubs, gallops RESPIRATORY:  Clear to auscultation without rales, wheezing or rhonchi  ABDOMEN: Soft, non-tender, non-distended MUSCULOSKELETAL:  No edema; tenderness on palpation of right and left chest noted. SKIN: Warm and dry NEUROLOGIC:  Alert and oriented x 3 PSYCHIATRIC:  Normal affect   ASSESSMENT:    1. Chest pain of uncertain etiology   2. Essential hypertension   3. Paroxysmal SVT (supraventricular tachycardia) (HCC)    PLAN:    In order of problems listed above:  1. Patient with history of atypical chest pain.  Currently asymptomatic.  Echocardiogram showed normal systolic and diastolic function, EF 60 to 65%.  Lexiscan Myoview with no evidence for ischemia.  Low risk scan.  Patient made aware of results.   2. Patient with  history of hypertension.  BP well controlled.  Continue losartan 25 mg daily, Cardizem 120 mg. 3. History of paroxysmal SVT, continue Cardizem as prescribed.  Denies  palpitations.  Follow-up in 1 year.  Total encounter time more than 40 minutes  Greater than 50% was spent in counseling and coordination of care with the patient   This note was generated in part or whole with voice recognition software. Voice recognition is usually quite accurate but there are transcription errors that can and very often do occur. I apologize for any typographical errors that were not detected and corrected.  Medication Adjustments/Labs and Tests Ordered: Current medicines are reviewed at length with the patient today.  Concerns regarding medicines are outlined above.  No orders of the defined types were placed in this encounter.  No orders of the defined types were placed in this encounter.   Patient Instructions  Medication Instructions:  Your physician recommends that you continue on your current medications as directed. Please refer to the Current Medication list given to you today. *If you need a refill on your cardiac medications before your next appointment, please call your pharmacy*   Lab Work: None Ordered If you have labs (blood work) drawn today and your tests are completely normal, you will receive your results only by: Marland Kitchen MyChart Message (if you have MyChart) OR . A paper copy in the mail If you have any lab test that is abnormal or we need to change your treatment, we will call you to review the results.   Testing/Procedures: None Ordered   Follow-Up: At [facility], you and your health needs are our priority.  As part of our continuing mission to provide you with exceptional heart care, we have created designated Provider Care Teams.  These Care Teams include your primary Cardiologist (physician) and Advanced Practice Providers (APPs -  Physician Assistants and Nurse Practitioners) who all work together to provide you with the care you need, when you need it.  We recommend signing up for the patient portal called "MyChart".  Sign up  information is provided on this After Visit Summary.  MyChart is used to connect with patients for Virtual Visits (Telemedicine).  Patients are able to view lab/test results, encounter notes, upcoming appointments, etc.  Non-urgent messages can be sent to your provider as well.   To learn more about what you can do with MyChart, go to [email or web address].    Your next appointment:   1 year(s)  The format for your next appointment:   In Person  Provider:   [name], MD   Other Instructions       Signed, [name], MD  02/11/2020 12:42 PM    [place]

## 2020-02-11 NOTE — Patient Instructions (Addendum)

## 2020-02-12 ENCOUNTER — Ambulatory Visit: Payer: BC Managed Care – PPO | Admitting: Adult Health

## 2020-02-12 NOTE — Addendum Note (Signed)
Addended by: Britt Bottom on: 02/12/2020 09:29 AM   Modules accepted: Orders

## 2020-02-17 ENCOUNTER — Other Ambulatory Visit: Payer: Self-pay

## 2020-02-17 ENCOUNTER — Encounter: Payer: Self-pay | Admitting: Dietician

## 2020-02-17 ENCOUNTER — Encounter: Payer: BC Managed Care – PPO | Attending: Surgery | Admitting: Dietician

## 2020-02-17 VITALS — Ht 66.0 in | Wt 242.5 lb

## 2020-02-17 DIAGNOSIS — E669 Obesity, unspecified: Secondary | ICD-10-CM

## 2020-02-17 DIAGNOSIS — Z6839 Body mass index (BMI) 39.0-39.9, adult: Secondary | ICD-10-CM

## 2020-02-17 NOTE — Patient Instructions (Signed)
   Gradually increase soft, solid protein foods such as tuna, egg, chicken, lean ground meat -- can cook in a low fat, low sugar sauce or gravy. Cottage cheese can also be a good choice.   Continue to include high iron sources and try adding some cream of wheat (can also add protein powder)

## 2020-02-17 NOTE — Progress Notes (Signed)
Medical Nutrition Therapy: Visit start time: 1330  end time: 1430  Assessment:  Diagnosis: obesity, history of bariatric surgery Past medical history: GERD, recent hernia repair Psychosocial issues/ stress concerns: high stress in past 2 years; feels she is dealing fair with stress  Preferred learning method:   Visual  Hands-on   Current weight: 242.5lbs Height: 5'6" Medications: acetaminophen prn, diltiazem, losartan, pantoprazole, Supplements: multivitamin, bariatric (opurity) multivitamin, calcium cirtrate 3x daily, vitamin B12, ferrous sulfate 325mg , elderberry, omega 3 oil, turmeric, vitamin C  Progress and evaluation:   Feels dehydrated easily, dry mouth, thirst, low energy, occasionally dizzy or headache. She sometimes drinks "liquid IV" when having symptoms and feels better. No diabetes, although does have fam history of DM  She has been waking up feeling light-headed and weak, feels her blood sugar might be low due to significant decrease in intake.  Low iron level, often fluctuating recently. Most recent lab result was Hemoglobin of 10.8, HCT 34.9 on 02/04/20. Iron has ranged from 23-90 since 05/28/20. Cause for iron deficiency/ fluctuation is unknown. She had been taking 2 325mg  iron supplements daily but now is down to 1 daily. She has been searching out iron rich foods.   For the past 2 weeks, she has been preparing liquid and semi-solid foods in am and grazing throughout the day on those foods.    Physical activity: walking  Dietary Intake:  Usual eating pattern: grazing pattern on liquid and semi-solid foods and beverages  Foods consumed during the past 2 weeks: protein shake, 2 per day instant low sugar oatmeal, thinned with protein drink, 1 pkt daily  low carb yogurt 1 per day instant mashed potatoes (1 box lasts 3 days), chicken broth Sugar free popsicles, sugar free jello  Beverages: water, occasionally Liquid IV additive, bai flavored water  Nutrition Care  Education: Topics covered:   Weight control/ bariatric diet: resuming guidelines of bariatric diet; transitioning back to soft solid protein foods and methods for keeping moist, importance of chewing thoroughty and avoiding beverages during mealtimes; reviewed importance of low sugar and low fat food and beverage choices Iron deficiency: food sources of iron, particularly soft and liquid foods  Nutritional Diagnosis:  [place]-2.2 Altered nutrition-related laboratory As related to iron deficiency.  As evidenced by low iron, hemoglobin, and hematocrit. [place]-1.4 Altered GI function As related to history of sleeve gastrectomy, hernia repair.  As evidenced by limited food portions, GI symptoms of acid reflux, abdominal pain. [place]-3.3 Overweight/obesity As related to history of excess calories, inadequate physical activity, high stress level.  As evidenced by patient with current BMI of 39.  Intervention:   Instruction and discussion as noted above.  Patient has been following liquid/ semi-solid diet since hernia surgery 2 weeks ago. She is motivated to gradually advance diet to improve overall nutrition, iron stores, and resume weight loss.  Established goals for change with direction from patient.   No follow-up MNT scheduled at this time; patient will schedule later if needed.  Education Materials given:   Iron deficiency Nutrition Therapy (NCM)  Phase 3 bariatric diet handout  Sample bariatric menus (AND)  Goals/ instructions   Learner/ who was taught:   Patient    Level of understanding:  Verbalizes/ demonstrates competency   Demonstrated degree of understanding via:   Teach back Learning barriers:  None  Willingness to learn/ readiness for change:  Eager, change in progress   Monitoring and Evaluation:  Dietary intake, exercise, iron deficiency status, GI symptoms, and body weight  follow up: prn

## 2020-02-19 NOTE — Progress Notes (Signed)
Virtual Visit via Video Note  I connected with@ on 9 11 21   at  9:20 AM EDT by a video enabled telemedicine application and verified that I am speaking with the correct person using two identifiers. Location patient: home Location provider:work  office Persons participating in the virtual visit: patient, provider  [place] national recommendations  regarding COVID 19 pandemic   video visit is advised over in office visit for this patient.  Patient aware  of the limitations of evaluation and management by telemedicine and  availability of in person appointments. and agreed to proceed.   HPI: Kimberly Patton presents for video visit   for [facility] Saturday clinic for  new problem evaluation. Noted  h tongue white directly after siurgey and some discomfort and burshing ttongu still apparent   HH repair and gastric sleeve repair   2 weeks  Remote hx of thrush Every since  Out of hosp still white at end and red spots all over  And  throat  feels sore  And swollen and headache   . Not sure why has HA  Less caffiene  Eating different cause of surgery  No fever    Surgery team said check with PCP   Has had covid vaccine  No known eposures  utd cards  Stable   ROS: See pertinent positives and negatives per HPI.  Past Medical History:  Diagnosis Date  . ALLERGIC RHINITIS 04/03/2007  . Anemia   . Anxiety   . Asthma    hx of years ago - no longer a problem   . ASTHMA, PERSISTENT, MODERATE 04/03/2007  . Cancer ([place])    skin   . Dyspnea on exertion    a. 11/2007 Echo: EF 60%.  . Dysrhythmia    hx of heart arrhythmia 10-15 years ago - followed by DR Lovena Le - not seen in years   . GERD (gastroesophageal reflux disease)   . 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 Hypertension   . Joint pain    HIPS / LEGS  . Knee injury    RT  . Meningioma ([place])   . Morbid obesity ([place])   . Paroxysmal SVT (supraventricular tachycardia) ([place])    a. 11/2011 48h Holter: RSR, rare PVC's, occas PAC's  . Retinal tear of right eye  01/2016  . Ventral hernia     Past Surgical History:  Procedure Laterality Date  . APPENDECTOMY  1996  . BREAST CYST EXCISION Right 1994   Benign  . CESAREAN SECTION     x2  . CHOLECYSTECTOMY  1995  . EYE SURGERY  01/2016   Repair retinal tear  . [place] / 1996   x2  . KNEE ARTHROSCOPY W/ MENISCAL REPAIR  07/25/13   right knee Dr. Mardelle Matte  . LAPAROSCOPIC GASTRIC SLEEVE RESECTION N/A 06/08/2013   Procedure: LAPAROSCOPIC GASTRIC SLEEVE RESECTION AND EXCISION OF SEBACEUS CYST FROM MID CHEST takedown of incarcerated ventral hernia and primary repair endoscopy;  Surgeon: [name], MD;  Location: [facility];  Service: General;  Laterality: N/A;  . LAPAROSCOPIC NISSEN FUNDOPLICATION N/A 06/13/7251   Procedure: LAPAROSCOPIC REPAIR LARGE SYMPTOMATIC HIATAL HERNIA WITH UPPER ENDOSCOPY;  Surgeon: Johnathan Hausen, MD;  Location: [facility];  Service: General;  Laterality: N/A;  . moles  06/2013   removed 2 moles from under arm and  lowback  . NECK SURGERY     occipital nerve damage- injections   . OVARIAN CYST REMOVAL  1970  . PILONIDAL CYST EXCISION  [place] Right 01/2016   [place]  . SKIN CANCER EXCISION    . TONSILLECTOMY    . UPPER GI ENDOSCOPY  06/08/2013   Procedure: UPPER GI ENDOSCOPY;  Surgeon: [name], MD;  Location: [facility];  Service: General;;  . US ECHOCARDIOGRAPHY  12/2011   WNL - EF 55-60%, mild MR, grade 1 diastolic dysnfiction (mild)  . VENTRAL HERNIA REPAIR  2000    Family History  Problem Relation Age of Onset  . Breast cancer Mother   . Diabetes Mother   . Hypertension Mother   . Kidney failure Mother   . Diabetes Father   . Hypertension Father   . Diabetes Other   . Hypertension Other   . Stroke Other   . Hemophilia Other     Social History   Tobacco Use  . Smoking status: Never Smoker  . Smokeless tobacco: Never Used  . Tobacco comment: smoke at age 39-12  Vaping Use  . Vaping Use: Never used  Substance  Use Topics  . Alcohol use: Not Currently    Alcohol/week: 0.0 standard drinks  . Drug use: No      Current Outpatient Medications:  .  acetaminophen (TYLENOL) 500 MG tablet, Take 1,000 mg by mouth every 6 (six) hours as needed (for pain.)., Disp: , Rfl:  .  albuterol (PROAIR HFA) 108 (90 Base) MCG/ACT inhaler, TAKE 2 PUFFS BY MOUTH EVERY 6 HOURS AS NEEDED, Disp: 8.5 Inhaler, Rfl: 11 .  Calcium Citrate-Vitamin D (CITRACAL + D PO), Take 2 tablets by mouth daily. , Disp: , Rfl:  .  Cyanocobalamin (VITAMIN B-12) 5000 MCG SUBL, Place 5,000 mcg under the tongue daily., Disp: , Rfl:  .  diltiazem (CARDIZEM CD) 120 MG 24 hr capsule, Take 1 capsule (120 mg total) by mouth daily., Disp: 30 capsule, Rfl: 11 .  ELDERBERRY PO, Take 1,000 mg by mouth daily. , Disp: , Rfl:  .  ferrous sulfate 325 (65 FE) MG EC tablet, Take 1 tablet (325 mg total) by mouth 2 (two) times daily with a meal. (Patient taking differently: Take 325 mg by mouth daily. ), Disp: 60 tablet, Rfl: 3 .  fluticasone (FLONASE) 50 MCG/ACT nasal spray, SPRAY 2 SPRAYS INTO EACH NOSTRIL EVERY DAY (Patient taking differently: Place 2 sprays into both nostrils daily. ), Disp: 48 mL, Rfl: 0 .  HYDROcodone-acetaminophen (NORCO/VICODIN) 5-325 MG tablet, Take 1 tablet by mouth every 6 (six) hours as needed for moderate pain., Disp: 15 tablet, Rfl: 0 .  losartan (COZAAR) 25 MG tablet, Take 1 tablet (25 mg total) by mouth daily., Disp: 90 tablet, Rfl: 3 .  Multiple Vitamin (MULTIVITAMIN WITH MINERALS) TABS tablet, Take 2 tablets by mouth daily. Multivitamin for Women, Disp: , Rfl:  .  Multiple Vitamins-Minerals (OPURITY) TABS, Take 1 tablet by mouth daily., Disp: , Rfl:  .  nitroGLYCERIN (NITROSTAT) 0.4 MG SL tablet, Place 1 tablet (0.4 mg total) under the tongue every 5 (five) minutes as needed for chest pain., Disp: 30 tablet, Rfl: 0 .  Omega-3 Fatty Acids (FISH OIL) 1200 MG CAPS, Take 1,200 mg by mouth daily. , Disp: , Rfl:  .  pantoprazole  (PROTONIX) 40 MG tablet, TAKE 1 TABLET BY MOUTH TWICE A DAY (Patient taking differently: Take 40 mg by mouth 2 (two) times daily. Morning & Mid-afternoon), Disp: 180 tablet, Rfl: 1 .  traMADol (ULTRAM) 50 MG tablet, Take 1 tablet (50 mg total) by mouth every 8 (eight) hours  as needed., Disp: 10 tablet, Rfl: 0 .  TURMERIC PO, Take 1 capsule by mouth daily., Disp: , Rfl:  .  vitamin C (ASCORBIC ACID) 250 MG tablet, Take 250 mg by mouth daily., Disp: , Rfl:  .  nystatin (MYCOSTATIN) 100000 UNIT/ML suspension, Take 5 mLs (500,000 Units total) by mouth 4 (four) times daily. place one-half of the dose in each side of mouth and retain in mouth as long as possible before swallowing., Disp: 280 mL, Rfl: 0  Current Facility-Administered Medications:  .  methylPREDNISolone acetate (DEPO-MEDROL) injection 80 mg, 80 mg, Intramuscular, Once, Baity, Regina W, NP  EXAM: BP Readings from Last 3 Encounters:  02/11/20 124/82  02/04/20 135/75  01/26/20 133/79    VITALS per patient if applicable: bno fever 97 temp   GENERAL: alert, oriented, appears well and in no acute distress  HEENT: atraumatic, conjunttiva clear, no obvious abnormalities on inspection of external nose and ears Op geographic tongue a bit red posterior in patches uvula no edema mild erythema? White spec.  Nl speech  NECK: normal movements of the head and neck LUNGS: on inspection no signs of respiratory distress, breathing rate appears normal, no obvious gross SOB, gasping or wheezing CV: no obvious cyanosis  PSYCH/NEURO: pleasant and cooperative, no obvious depression or anxiety, speech and thought processing grossly intact Lab Results  Component Value Date   WBC 6.0 02/04/2020   HGB 10.8 (L) 02/04/2020   HCT 34.9 (L) 02/04/2020   PLT 213 02/04/2020   GLUCOSE 87 01/26/2020   CHOL 209 (H) 06/02/2019   TRIG 111.0 06/02/2019   HDL 63.10 06/02/2019   LDLCALC 124 (H) 06/02/2019   ALT 14 01/10/2020   AST 18 01/10/2020   NA 138  01/26/2020   K 3.5 01/26/2020   CL 100 01/26/2020   CREATININE 0.50 02/02/2020   BUN 12 01/26/2020   CO2 28 01/26/2020   TSH 0.66 11/23/2019   INR 1.0 12/03/2007   HGBA1C 5.3 07/16/2019    ASSESSMENT AND PLAN:  Discussed the following assessment and plan:    ICD-10-CM   1. Thrush, oral ?  B37.0   2. Tongue, geographic  K14.1   3. History of repair of hiatal hernia  Z98.890    Z87.19   4. Headache, unspecified headache type  R51.9   5. S/P bariatric surgery  Z98.84    Possible  thrush but  Some like geographic tongue post op   Ha no systemic sx poss hydration etc .bp normal follow stay hydrated and fu if     persistent or progressive  willrx conservatively with nystatin liquid for now  And follow  Counseled.   Expectant management and discussion of plan and treatment with opportunity to ask questions and all were answered. The patient agreed with the plan and demonstrated an understanding of the instructions.   Advised to call back or seek an in-person evaluation if worsening  or having  further concerns . Return if symptoms worsen or fail to improve as expected. 30 minutes review  Evaluate counsel plan    Shanon Ace, MD

## 2020-02-20 ENCOUNTER — Telehealth (INDEPENDENT_AMBULATORY_CARE_PROVIDER_SITE_OTHER): Payer: BC Managed Care – PPO | Admitting: Internal Medicine

## 2020-02-20 VITALS — Temp 97.7°F | Wt 241.0 lb

## 2020-02-20 DIAGNOSIS — K141 Geographic tongue: Secondary | ICD-10-CM | POA: Diagnosis not present

## 2020-02-20 DIAGNOSIS — B370 Candidal stomatitis: Secondary | ICD-10-CM

## 2020-02-20 DIAGNOSIS — Z9889 Other specified postprocedural states: Secondary | ICD-10-CM

## 2020-02-20 DIAGNOSIS — Z8719 Personal history of other diseases of the digestive system: Secondary | ICD-10-CM

## 2020-02-20 DIAGNOSIS — Z9884 Bariatric surgery status: Secondary | ICD-10-CM

## 2020-02-20 DIAGNOSIS — R519 Headache, unspecified: Secondary | ICD-10-CM

## 2020-02-20 MED ORDER — NYSTATIN 100000 UNIT/ML MT SUSP: 500000.0000 [IU] | Freq: Four times a day (QID) | OROMUCOSAL | 0 refills | Status: DC

## 2020-02-24 ENCOUNTER — Other Ambulatory Visit: Payer: Self-pay

## 2020-02-24 ENCOUNTER — Telehealth: Payer: Self-pay

## 2020-02-24 ENCOUNTER — Encounter: Payer: Self-pay | Admitting: Family Medicine

## 2020-02-24 ENCOUNTER — Ambulatory Visit: Payer: BC Managed Care – PPO | Admitting: Family Medicine

## 2020-02-24 VITALS — BP 124/70 | HR 77 | Temp 97.1°F | Ht 65.5 in | Wt 239.5 lb

## 2020-02-24 DIAGNOSIS — R42 Dizziness and giddiness: Secondary | ICD-10-CM | POA: Diagnosis not present

## 2020-02-24 LAB — CBC WITH DIFFERENTIAL/PLATELET
Basophils Absolute: 0 10*3/uL (ref 0.0–0.1)
Basophils Relative: 0.6 % (ref 0.0–3.0)
Eosinophils Absolute: 0.2 10*3/uL (ref 0.0–0.7)
Eosinophils Relative: 3.3 % (ref 0.0–5.0)
HCT: 37 % (ref 36.0–46.0)
Hemoglobin: 12.3 g/dL (ref 12.0–15.0)
Lymphocytes Relative: 21.9 % (ref 12.0–46.0)
Lymphs Abs: 1.6 10*3/uL (ref 0.7–4.0)
MCHC: 33.3 g/dL (ref 30.0–36.0)
MCV: 81.8 fl (ref 78.0–100.0)
Monocytes Absolute: 0.5 10*3/uL (ref 0.1–1.0)
Monocytes Relative: 6.2 % (ref 3.0–12.0)
Neutro Abs: 5 10*3/uL (ref 1.4–7.7)
Neutrophils Relative %: 68 % (ref 43.0–77.0)
Platelets: 287 10*3/uL (ref 150.0–400.0)
RBC: 4.52 Mil/uL (ref 3.87–5.11)
RDW: 19 % — ABNORMAL HIGH (ref 11.5–15.5)
WBC: 7.3 10*3/uL (ref 4.0–10.5)

## 2020-02-24 LAB — COMPREHENSIVE METABOLIC PANEL
ALT: 15 U/L (ref 0–35)
AST: 13 U/L (ref 0–37)
Albumin: 4.4 g/dL (ref 3.5–5.2)
Alkaline Phosphatase: 65 U/L (ref 39–117)
BUN: 19 mg/dL (ref 6–23)
CO2: 29 mEq/L (ref 19–32)
Calcium: 9.7 mg/dL (ref 8.4–10.5)
Chloride: 102 mEq/L (ref 96–112)
Creatinine, Ser: 0.57 mg/dL (ref 0.40–1.20)
GFR: 106.67 mL/min (ref >60.00)
Glucose, Bld: 97 mg/dL (ref 70–99)
Potassium: 3.9 mEq/L (ref 3.5–5.1)
Sodium: 139 mEq/L (ref 135–145)
Total Bilirubin: 0.4 mg/dL (ref 0.2–1.2)
Total Protein: 7.4 g/dL (ref 6.0–8.3)

## 2020-02-24 NOTE — Telephone Encounter (Signed)
Noted  

## 2020-02-24 NOTE — Progress Notes (Signed)
   Subjective:    Patient ID: Kimberly Patton, female    DOB: [birth year], 64 y.o.   MRN: [id number]  HPI Chief Complaint  Patient presents with  . Dizziness    off and on all summer but dizziness started back yesterday   Symptoms started mid June, not feeling well, fatigued. Three weeks ago had hiatal hernia repair. Bland diet, small quantities. Has history of anemia, taking iron supplementation, vitamins. Drinks 3 protein shakes a day, Bai water and additional water. Was told by nutritionist to eat before bedtime. This seemed to help initially. Yesterday, started shaking, felt cold. Has high anxiety, lives by herself. BP 164/86 (high for her). No sensation of spinning, feels "lightheaded in forehead and eyes." Lasted about 45 minutes (shaking). Lightheadedness all day. Subsided on its own. No known trigger. Had eye exam earlier this summer. Felt sensitive to sun yesterday. Does not feel like she is going to pass out. This is 4th episode for summer. No headaches.   Was diagnosed with thrush, virtual visit last week. Nystatin swish and swallow with good results.   Review of Systems Per HPI    Objective:   Physical Exam    BP 124/70   Pulse 77   Temp (!) 97.1 F (36.2 C) (Temporal)   Ht 5' 5.5" (1.664 m)   Wt 239 lb 8 oz (108.6 kg)   SpO2 96%   BMI 39.25 kg/m  Wt Readings from Last 3 Encounters:  02/24/20 239 lb 8 oz (108.6 kg)  02/20/20 241 lb (109.3 kg)  02/17/20 242 lb 8 oz (110 kg)   BP Readings from Last 3 Encounters:  02/24/20 124/70  02/11/20 124/82  02/04/20 135/75   Orthostatic VS for the past 24 hrs:  BP- Lying Pulse- Lying BP- Sitting Pulse- Sitting BP- Standing at 0 minutes Pulse- Standing at 0 minutes  02/24/20 1147 130/77 64 135/75 70 129/79 87        Assessment & Plan:  1. Lightheadedness -Unclear etiology, orthostatic vital signs look good -Discussed importance of regular meals, adequate protein, adequate hydration -History of anemia, will check labs  today - CBC with Differential - Comprehensive metabolic panel  This visit occurred during the SARS-CoV-2 public health emergency.  Safety protocols were in place, including screening questions prior to the visit, additional usage of staff PPE, and extensive cleaning of exam room while observing appropriate contact time as indicated for disinfecting solutions.    Clarene Reamer, FNP-BC  [facility]  02/24/2020 5:27 PM

## 2020-02-24 NOTE — Telephone Encounter (Signed)
Pt has apt with Deliah Goody today at [facility]

## 2020-02-24 NOTE — Telephone Encounter (Signed)
[facility] - Client TELEPHONE ADVICE RECORD [facility] Patient Name: Kimberly Patton Gender: Female DOB: 1955/09/05 Age: 64 Y 26 M 27 D Return Phone Number: [phone] (Primary) Address: City/State/Zip: [place] [address] Client [facility] - Client Client Site [place] - Day Physician [name] - MD Contact Type Call Who Is Calling Patient / Member / Family / Caregiver Call Type Triage / Clinical Relationship To Patient Self Return Phone Number [phone] (Primary) Chief Complaint Dizziness Reason for Call Symptomatic / Request for Health Information Initial Comment Transferrred from answering service. PT is having dizziness, PT had surgery 3 weeks ago and has low blood. Translation No Nurse Assessment Nurse: Harlow Mares, RN, Suanne Marker Date/Time (Eastern Time): 02/24/2020 8:37:45 AM Confirm and document reason for call. If symptomatic, describe symptoms. ---Transferred from answering service. PT is having dizziness yesterday and BP was higher than usual. PT had surgery 3 weeks ago and has low blood. (hiatal hernia repair). Reports that she has some shakes yesterday and felt very cold and she felt this way the last time her blood level dropped. Has the patient had close contact with a person known or suspected to have the novel coronavirus illness OR traveled / lives in area with major community spread (including international travel) in the last 14 days from the onset of symptoms? * If Asymptomatic, screen for exposure and travel within the last 14 days. ---No Does the patient have any new or worsening symptoms? ---Yes Will a triage be completed? ---Yes Related visit to physician within the last 2 weeks? ---No Does the PT have any chronic conditions? (i.e. diabetes, asthma, this includes High risk factors for pregnancy, etc.) ---Yes List chronic conditions. ---[facility] repair recently; anemia; Is  this a behavioral health or substance abuse call? ---No Guidelines Guideline Title Affirmed Question Affirmed Notes Nurse Date/Time (Eastern Time) Dizziness - Lightheadedness [1] MODERATE dizziness (e.g., interferes with normal activities) AND [2] has NOT been Harlow Mares, Therapist, sports, Suanne Marker 02/24/2020 8:40:03 AMPLEASE NOTE: All timestamps contained within this report are represented as [place] Standard Time. CONFIDENTIALTY NOTICE: This fax transmission is intended only for the addressee. It contains information that is legally privileged, confidential or otherwise protected from use or disclosure. If you are not the intended recipient, you are strictly prohibited from reviewing, disclosing, copying using or disseminating any of this information or taking any action in reliance on or regarding this information. If you have received this fax in error, please notify us immediately by telephone so that we can arrange for its return to [place]. Phone: [phone], Toll-Free: [phone], Fax: [phone] Page: 2 of 2 Call Id: [id number] Guidelines Guideline Title Affirmed Question Affirmed Notes Nurse Date/Time Eilene Ghazi Time) evaluated by physician for this (Exception: dizziness caused by heat exposure, sudden standing, or poor fluid intake) Disp. Time Eilene Ghazi Time) Disposition Final User 02/24/2020 8:35:30 AM Send To RN Personal Micki Riley, RN, Domenick Gong 02/24/2020 8:45:55 AM Send To RN Personal Harlow Mares, RN, Rhonda 02/24/2020 8:47:16 AM See PCP within 24 Hours Yes Harlow Mares, RN, Rosalyn Charters Disagree/Comply Comply Caller Understands Yes PreDisposition Call Doctor Care Advice Given Per Guideline SEE PCP WITHIN 24 HOURS: * IF OFFICE WILL BE OPEN: You need to be examined within the next 24 hours. Call your doctor (or NP/PA) when the office opens and make an appointment. DRINK FLUIDS: * Drink several glasses of fruit juice, other clear fluids or water. * This will improve hydration and blood glucose. * If  the weather is hot  or you have a fever, make sure the fluids are cold. LIE DOWN AND REST: * Lie down with feet elevated for 1 hour. * This will improve circulation and increase blood flow to the brain. CALL BACK IF: * Passes out (faints) * You become worse CARE ADVICE given per Dizziness (Adult) guideline. Referrals REFERRED TO PCP OFFICE

## 2020-02-24 NOTE — Telephone Encounter (Signed)
Spoke with patient regarding her concerns she sent through MyChart yesterday:  "I have been feeling alittle light headed today. Still recovering from surgery.  Checked my blood pressure it's been 153/89  to 163/ 75 through out the day.  Patient had a recent Hiatal Hernia repair on 8/24 and stated she has been feeling light headed for a couple days. Her BP was 124/82, HR 54 at her last OV with [place] on 9/2. Patient states that is within her normal range, so she does have some elevated BP at this time.   She stated she is drinking Liquid IV, and multiple protein shakes daily.   Patient is being seen by Tor Netters, FNP today. I will follow up with chart review tomorrow and forward any findings to Dr. Garen Lah for review.

## 2020-02-24 NOTE — Patient Instructions (Signed)
Good to see you today  Hang in there!  I will notify you of lab results. Please track episodes.

## 2020-02-26 MED ORDER — LOSARTAN POTASSIUM 25 MG PO TABS: 50.0000 mg | ORAL_TABLET | Freq: Every day | ORAL | 3 refills | Status: DC

## 2020-02-26 NOTE — Telephone Encounter (Signed)
Spoke with patient after talking with Dr. Garen Lah and relayed his recommendation to Increase her Losartan to 50mg  daily. Patient requested a follow up visit to address her dizziness. I scheduled her to be seen next week.   Patient verbalized understanding and was very grateful for the follow up.

## 2020-02-26 NOTE — Addendum Note (Signed)
Addended by: Kavin Leech on: 02/26/2020 01:33 PM   Modules accepted: Orders

## 2020-02-27 ENCOUNTER — Telehealth: Payer: Self-pay | Admitting: Student

## 2020-02-27 NOTE — Telephone Encounter (Addendum)
   The patient called the after-hours line reporting episodes of lightheadedness and nausea which has been occurring intermittently for the past week. She made Cardiology aware of this along with elevated blood pressure readings and Losartan was just titrated yesterday from 25 mg daily to 50 mg daily. BP was at 136/73 this morning and she felt well but upon driving later this morning she developed lightheadedness and nausea. Upon returning home, BP was elevated at 164/84 and remains elevated at this time. Denies any associated chest pain, palpitations or dyspnea. She does report having intermittent pressure behind her left eye and associated ear pain.  Given her recent dose adjustment, I recommended she continue Losartan at the increased dose of 50 mg daily and keep a blood pressure log and bring this to her visit later this week.  We reviewed warning signs for a stroke and I encouraged her to seek urgent attention if she develops any of those symptoms.   I did encourage her to follow-up with her PCP if she continues to have associated ear pain as this could be due to an inner ear infection. She voiced understanding of this information and was appreciative of the return call.  Signed, Erma Heritage, PA-C 02/27/2020, 11:32 AM Pager: [phone]

## 2020-03-03 ENCOUNTER — Encounter: Payer: Self-pay | Admitting: Cardiology

## 2020-03-03 ENCOUNTER — Other Ambulatory Visit: Payer: Self-pay

## 2020-03-03 ENCOUNTER — Ambulatory Visit (INDEPENDENT_AMBULATORY_CARE_PROVIDER_SITE_OTHER): Payer: BC Managed Care – PPO | Admitting: Cardiology

## 2020-03-03 VITALS — BP 128/88 | HR 70 | Ht 66.0 in | Wt 238.0 lb

## 2020-03-03 DIAGNOSIS — I10 Essential (primary) hypertension: Secondary | ICD-10-CM

## 2020-03-03 DIAGNOSIS — I471 Supraventricular tachycardia: Secondary | ICD-10-CM

## 2020-03-03 MED ORDER — DILTIAZEM HCL ER COATED BEADS 120 MG PO CP24: 120.0000 mg | ORAL_CAPSULE | Freq: Two times a day (BID) | ORAL | 5 refills | Status: DC

## 2020-03-03 NOTE — Patient Instructions (Signed)
Medication Instructions:  Your physician has recommended you make the following change in your medication:   1)  STOP taking your Losartan. 2)  INCREASE your diltiazem (CARDIZEM CD) 120 MG 24 hr capsule: Take 1 capsule (120 mg total) by mouth in the morning and at bedtime  *If you need a refill on your cardiac medications before your next appointment, please call your pharmacy*   Lab Work: None Ordered If you have labs (blood work) drawn today and your tests are completely normal, you will receive your results only by: Marland Kitchen MyChart Message (if you have MyChart) OR . A paper copy in the mail If you have any lab test that is abnormal or we need to change your treatment, we will call you to review the results.   Testing/Procedures: None Ordered   Follow-Up: At [facility], you and your health needs are our priority.  As part of our continuing mission to provide you with exceptional heart care, we have created designated Provider Care Teams.  These Care Teams include your primary Cardiologist (physician) and Advanced Practice Providers (APPs -  Physician Assistants and Nurse Practitioners) who all work together to provide you with the care you need, when you need it.  We recommend signing up for the patient portal called "MyChart".  Sign up information is provided on this After Visit Summary.  MyChart is used to connect with patients for Virtual Visits (Telemedicine).  Patients are able to view lab/test results, encounter notes, upcoming appointments, etc.  Non-urgent messages can be sent to your provider as well.   To learn more about what you can do with MyChart, go to [email or web address].    Your next appointment:   1 month(s)  The format for your next appointment:   In Person  Provider:   [name], MD   Other Instructions

## 2020-03-03 NOTE — Progress Notes (Signed)
Cardiology Office Note:    Date:  03/03/2020   ID:  Kimberly Patton, DOB 1955/07/23, MRN [id number]  PCP:  Jinny Sanders, MD  [facility] Cardiologist:  [name], MD  [place] Electrophysiologist:  None   Referring MD: Jinny Sanders, MD   Chief Complaint  Patient presents with  . Follow-up    Patient C/O nausea since taking Losartan 50mg . Meds reviewed verbally.    History of Present Illness:    Kimberly Patton is a 64 y.o. female with a hx of hypertension, hiatal hernia, GERD, iron deficiency anemia, SVT on diltiazem presenting for follow-up. Patient being seen due to hypertension and medication side effect. She takes losartan for elevated blood pressure but she states losartan has caused her to be nauseous.  He checks her blood pressure at home on her risk and is usually elevated.  She brought blood pressure cuff to the office today and upon inspection it was noted to be small for her arm size.  Prior notes Echocardiogram 01/2020 showed normal systolic and diastolic function, EF 60 to 65%. Lexiscan Myoview 01/2020 with no evidence for ischemia. Has a history of hypokalemia while taking HCTZ. Losartan was started after stopping HCTZ.   Past Medical History:  Diagnosis Date  . ALLERGIC RHINITIS 04/03/2007  . Anemia   . Anxiety   . Asthma    hx of years ago - no longer a problem   . ASTHMA, PERSISTENT, MODERATE 04/03/2007  . Cancer ([place])    skin   . Dyspnea on exertion    a. 11/2007 Echo: EF 60%.  . Dysrhythmia    hx of heart arrhythmia 10-15 years ago - followed by DR Lovena Le - not seen in years   . GERD (gastroesophageal reflux disease)   . 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 Hypertension   . Joint pain    HIPS / LEGS  . Knee injury    RT  . Meningioma ([place])   . Morbid obesity ([place])   . Paroxysmal SVT (supraventricular tachycardia) ([place])    a. 11/2011 48h Holter: RSR, rare PVC's, occas PAC's  . Retinal tear of right eye 01/2016  . Ventral hernia     Past  Surgical History:  Procedure Laterality Date  . APPENDECTOMY  1996  . BREAST CYST EXCISION Right 1994   Benign  . CESAREAN SECTION     x2  . CHOLECYSTECTOMY  1995  . EYE SURGERY  01/2016   Repair retinal tear  . [place] / 1996   x2  . KNEE ARTHROSCOPY W/ MENISCAL REPAIR  07/25/13   right knee Dr. Mardelle Matte  . LAPAROSCOPIC GASTRIC SLEEVE RESECTION N/A 06/08/2013   Procedure: LAPAROSCOPIC GASTRIC SLEEVE RESECTION AND EXCISION OF SEBACEUS CYST FROM MID CHEST takedown of incarcerated ventral hernia and primary repair endoscopy;  Surgeon: [name], MD;  Location: [facility];  Service: General;  Laterality: N/A;  . LAPAROSCOPIC NISSEN FUNDOPLICATION N/A 1/32/4401   Procedure: LAPAROSCOPIC REPAIR LARGE SYMPTOMATIC HIATAL HERNIA WITH UPPER ENDOSCOPY;  Surgeon: Johnathan Hausen, MD;  Location: [facility];  Service: General;  Laterality: N/A;  . moles  06/2013   removed 2 moles from under arm and  lowback  . NECK SURGERY     occipital nerve damage- injections   . OVARIAN CYST REMOVAL  1970  . PILONIDAL CYST EXCISION  1975  . RETINAL TEAR REPAIR CRYOTHERAPY Right 01/2016   [place]  . SKIN CANCER EXCISION    . TONSILLECTOMY    .  UPPER GI ENDOSCOPY  06/08/2013   Procedure: UPPER GI ENDOSCOPY;  Surgeon: [name], MD;  Location: [facility];  Service: General;;  . US ECHOCARDIOGRAPHY  12/2011   WNL - EF 55-60%, mild MR, grade 1 diastolic dysnfiction (mild)  . VENTRAL HERNIA REPAIR  2000    Current Medications: Current Meds  Medication Sig  . acetaminophen (TYLENOL) 500 MG tablet Take 1,000 mg by mouth every 6 (six) hours as needed (for pain.).  Marland Kitchen albuterol (PROAIR HFA) 108 (90 Base) MCG/ACT inhaler TAKE 2 PUFFS BY MOUTH EVERY 6 HOURS AS NEEDED  . Calcium Citrate-Vitamin D (CITRACAL + D PO) Take 2 tablets by mouth daily.   . Cyanocobalamin (VITAMIN B-12) 5000 MCG SUBL Place 5,000 mcg under the tongue daily.  Marland Kitchen diltiazem (CARDIZEM CD) 120 MG 24 hr capsule Take 1 capsule (120 mg total)  by mouth in the morning and at bedtime.  Marland Kitchen ELDERBERRY PO Take 1,000 mg by mouth daily.   . ferrous sulfate 325 (65 FE) MG EC tablet Take 1 tablet (325 mg total) by mouth 2 (two) times daily with a meal. (Patient taking differently: Take 325 mg by mouth daily. )  . fluticasone (FLONASE) 50 MCG/ACT nasal spray SPRAY 2 SPRAYS INTO EACH NOSTRIL EVERY DAY (Patient taking differently: Place 2 sprays into both nostrils daily. )  . Multiple Vitamins-Minerals (OPURITY) TABS Take 1 tablet by mouth daily.  . nitroGLYCERIN (NITROSTAT) 0.4 MG SL tablet Place 1 tablet (0.4 mg total) under the tongue every 5 (five) minutes as needed for chest pain.  . Omega-3 Fatty Acids (FISH OIL) 1200 MG CAPS Take 1,200 mg by mouth daily.   . pantoprazole (PROTONIX) 40 MG tablet TAKE 1 TABLET BY MOUTH TWICE A DAY (Patient taking differently: Take 40 mg by mouth 2 (two) times daily. Morning & Mid-afternoon)  . traMADol (ULTRAM) 50 MG tablet Take 1 tablet (50 mg total) by mouth every 8 (eight) hours as needed.  . TURMERIC PO Take 1 capsule by mouth daily.  . vitamin C (ASCORBIC ACID) 250 MG tablet Take 250 mg by mouth daily.  . [DISCONTINUED] diltiazem (CARDIZEM CD) 120 MG 24 hr capsule Take 1 capsule (120 mg total) by mouth daily.  . [DISCONTINUED] losartan (COZAAR) 25 MG tablet Take 2 tablets (50 mg total) by mouth daily.   Current Facility-Administered Medications for the 03/03/20 encounter (Office Visit) with [name], MD  Medication  . methylPREDNISolone acetate (DEPO-MEDROL) injection 80 mg     Allergies:   Wasp venom, Chlorhexidine gluconate, Prednisone, and Sodium hypochlorite   Social History   Socioeconomic History  . Marital status: Divorced    Spouse name: Not on file  . Number of children: 2  . Years of education: 89  . Highest education level: Not on file  Occupational History  . Occupation: Product manager: [employer]  Tobacco Use  . Smoking status: Never Smoker  .  Smokeless tobacco: Never Used  . Tobacco comment: smoke at age [age]  Vaping Use  . Vaping Use: Never used  Substance and Sexual Activity  . Alcohol use: Not Currently    Alcohol/week: 0.0 standard drinks  . Drug use: No  . Sexual activity: Not on file  Other Topics Concern  . Not on file  Social History Narrative   Lives at home alone.   Right-handed.   Occasional caffeine use.   Social Determinants of Health   Financial Resource Strain:   . Difficulty of Paying Living  Expenses: Not on file  Food Insecurity:   . Worried About Charity fundraiser in the Last Year: Not on file  . Ran Out of Food in the Last Year: Not on file  Transportation Needs:   . Lack of Transportation (Medical): Not on file  . Lack of Transportation (Non-Medical): Not on file  Physical Activity:   . Days of Exercise per Week: Not on file  . Minutes of Exercise per Session: Not on file  Stress:   . Feeling of Stress : Not on file  Social Connections:   . Frequency of Communication with Friends and Family: Not on file  . Frequency of Social Gatherings with Friends and Family: Not on file  . Attends Religious Services: Not on file  . Active Member of Clubs or Organizations: Not on file  . Attends Archivist Meetings: Not on file  . Marital Status: Not on file     Family History: The patient's family history includes Breast cancer in her mother; Diabetes in her father, mother, and another family member; Hemophilia in an other family member; Hypertension in her father, mother, and another family member; Kidney failure in her mother; Stroke in an other family member.  ROS:   Please see the history of present illness.     All other systems reviewed and are negative.  EKGs/Labs/Other Studies Reviewed:    The following studies were reviewed today:   EKG:  EKG is  ordered today.  The ekg ordered today demonstrates normal sinus rhythm.  Recent Labs: 11/23/2019: TSH 0.66 01/10/2020: Magnesium  2.1 02/24/2020: ALT 15; BUN 19; Creatinine, Ser 0.57; Hemoglobin 12.3; Platelets 287.0; Potassium 3.9; Sodium 139  Recent Lipid Panel    Component Value Date/Time   CHOL 209 (H) 06/02/2019 0804   TRIG 111.0 06/02/2019 0804   HDL 63.10 06/02/2019 0804   CHOLHDL 3 06/02/2019 0804   VLDL 22.2 06/02/2019 0804   LDLCALC 124 (H) 06/02/2019 0804    Physical Exam:    VS:  BP 128/88 (BP Location: Left Arm, Patient Position: Sitting, Cuff Size: Large)   Pulse 70   Ht 5\' 6"  (1.676 m)   Wt 238 lb (108 kg)   SpO2 97%   BMI 38.41 kg/m     Wt Readings from Last 3 Encounters:  03/03/20 238 lb (108 kg)  02/24/20 239 lb 8 oz (108.6 kg)  02/20/20 241 lb (109.3 kg)     GEN:  Well nourished, well developed in no acute distress, obese HEENT: Normal NECK: No JVD; No carotid bruits LYMPHATICS: No lymphadenopathy CARDIAC: RRR, no murmurs, rubs, gallops RESPIRATORY:  Clear to auscultation without rales, wheezing or rhonchi  ABDOMEN: Soft, non-tender, non-distended MUSCULOSKELETAL:  No edema; tenderness on palpation of right and left chest noted. SKIN: Warm and dry NEUROLOGIC:  Alert and oriented x 3 PSYCHIATRIC:  Normal affect   ASSESSMENT:    1. Essential hypertension   2. Paroxysmal SVT (supraventricular tachycardia) (HCC)    PLAN:    In order of problems listed above:   1. Patient with history of hypertension.  BP well controlled.  With patient having symptoms of nausea since starting losartan.  Stop losartan, increase Cardizem CD to 120 mg twice daily. 2. History of paroxysmal SVT, Cardizem as above.  Denies palpitations. Last echo with normal EF.  Follow-up in 1 month.  Total encounter time more than 40 minutes  Greater than 50% was spent in counseling and coordination of care with the patient  This note was generated in part or whole with voice recognition software. Voice recognition is usually quite accurate but there are transcription errors that can and very often do occur.  I apologize for any typographical errors that were not detected and corrected.  Medication Adjustments/Labs and Tests Ordered: Current medicines are reviewed at length with the patient today.  Concerns regarding medicines are outlined above.  Orders Placed This Encounter  Procedures  . EKG 12-Lead   Meds ordered this encounter  Medications  . diltiazem (CARDIZEM CD) 120 MG 24 hr capsule    Sig: Take 1 capsule (120 mg total) by mouth in the morning and at bedtime.    Dispense:  60 capsule    Refill:  5    Patient Instructions  Medication Instructions:  Your physician has recommended you make the following change in your medication:   1)  STOP taking your Losartan. 2)  INCREASE your diltiazem (CARDIZEM CD) 120 MG 24 hr capsule: Take 1 capsule (120 mg total) by mouth in the morning and at bedtime  *If you need a refill on your cardiac medications before your next appointment, please call your pharmacy*   Lab Work: None Ordered If you have labs (blood work) drawn today and your tests are completely normal, you will receive your results only by: Marland Kitchen MyChart Message (if you have MyChart) OR . A paper copy in the mail If you have any lab test that is abnormal or we need to change your treatment, we will call you to review the results.   Testing/Procedures: None Ordered   Follow-Up: At [facility], you and your health needs are our priority.  As part of our continuing mission to provide you with exceptional heart care, we have created designated Provider Care Teams.  These Care Teams include your primary Cardiologist (physician) and Advanced Practice Providers (APPs -  Physician Assistants and Nurse Practitioners) who all work together to provide you with the care you need, when you need it.  We recommend signing up for the patient portal called "MyChart".  Sign up information is provided on this After Visit Summary.  MyChart is used to connect with patients for Virtual Visits  (Telemedicine).  Patients are able to view lab/test results, encounter notes, upcoming appointments, etc.  Non-urgent messages can be sent to your provider as well.   To learn more about what you can do with MyChart, go to [email or web address].    Your next appointment:   1 month(s)  The format for your next appointment:   In Person  Provider:   [name], MD   Other Instructions      Signed, [name], MD  03/03/2020 12:42 PM    [place]

## 2020-03-04 ENCOUNTER — Other Ambulatory Visit (HOSPITAL_COMMUNITY): Payer: Self-pay | Admitting: Surgery

## 2020-03-04 ENCOUNTER — Other Ambulatory Visit: Payer: Self-pay | Admitting: Surgery

## 2020-03-04 ENCOUNTER — Other Ambulatory Visit: Payer: Self-pay | Admitting: Family Medicine

## 2020-03-04 DIAGNOSIS — R109 Unspecified abdominal pain: Secondary | ICD-10-CM

## 2020-03-07 ENCOUNTER — Telehealth: Payer: Self-pay

## 2020-03-07 NOTE — Telephone Encounter (Signed)
Noted  

## 2020-03-07 NOTE — Telephone Encounter (Signed)
[place] [facility] - Client TELEPHONE ADVICE RECORD Access[facility]urse Patient Name: Kimberly Patton Gender: Female DOB: 1955/12/04 Age: 64 Y 5 M 5 D Return Phone Number: [phone] (Primary) Address: City/State/Zip: [place] [address] Client [facility] - Client Client Site [place] - Day Physician [name] - MD Contact Type Call Who Is Calling Patient / Member / Family / Caregiver Call Type Triage / Clinical Relationship To Patient Self Return Phone Number [phone] (Primary) Chief Complaint Vision - (non urgent symptoms) Reason for Call Symptomatic / Request for [place] states she is having right ear pain. Left eye is blurred. Translation No Nurse Assessment Nurse: Jimmye Norman, RN, [place] Date/Time (Eastern Time): 03/07/2020 10:37:01 AM Confirm and document reason for call. If symptomatic, describe symptoms. ---Caller states she is having right ear pain. Left eye is itchy. Does the patient have any new or worsening symptoms? ---Yes Will a triage be completed? ---Yes Related visit to physician within the last 2 weeks? ---No Does the PT have any chronic conditions? (i.e. diabetes, asthma, this includes High risk factors for pregnancy, etc.) ---Yes List chronic conditions. ---hypertension Is this a behavioral health or substance abuse call? ---No Guidelines Guideline Title Affirmed Question Affirmed Notes Nurse Date/Time (Eastern Time) Earache New blurred vision or vision changes Jimmye Norman, RN, [place] 03/07/2020 10:39:30 AM Disp. Time Eilene Ghazi Time) Disposition Final User 03/07/2020 10:44:07 AM See HCP within 4 Hours (or PCP triage) Yes Jimmye Norman, RN, Loree Fee Caller Disagree/Comply Disagree Caller Understands Yes PreDisposition InappropriateToAsk PLEASE NOTE: All timestamps contained within this report are represented as [place] Standard Time. CONFIDENTIALTY NOTICE:  This fax transmission is intended only for the addressee. It contains information that is legally privileged, confidential or otherwise protected from use or disclosure. If you are not the intended recipient, you are strictly prohibited from reviewing, disclosing, copying using or disseminating any of this information or taking any action in reliance on or regarding this information. If you have received this fax in error, please notify us immediately by telephone so that we can arrange for its return to [place]. Phone: [phone], Toll-Free: [phone], Fax: [phone] Page: 2 of 2 Call Id: [id number] Care Advice Given Per Guideline SEE HCP (OR PCP TRIAGE) WITHIN 4 HOURS: * IF OFFICE WILL BE OPEN: You need to be seen within the next 3 or 4 hours. Call your doctor (or NP/PA) now or as soon as the office opens. Comments User: Myriam Forehand, RN Date/Time Eilene Ghazi Time): 03/07/2020 10:42:57 AM RN spoke with Shirlean Mylar and informed of triage outcome 3/4 hours. Shirlean Mylar states they do not have anything available, refer to urgent care. User: Myriam Forehand, RN Date/Time (Eastern Time): 03/07/2020 10:44:34 AM Caller states she is not going to urgent care, requesting to be referred back to the office to schedule an appointment. User: Myriam Forehand, RN Date/Time (Eastern Time): 03/07/2020 10:48:05 AM RN warm transferred to [facility] in the office and informed of caller refusal for urgent care. Referrals GO TO FACILITY REFUSED

## 2020-03-07 NOTE — Telephone Encounter (Signed)
[place] nurse with access nurse has pt on hold; pt has rt ear pain and lt eye is sensitive to light but pt was seen last week by eye dr and not sure what cause of light sensitivity is. Pt is to be seen within 4 hrs and no available appts at [facility] and pt refuses UC. I spoke with pt; pt has sinus pressure on lt side of face; nasal drip for awhile;pt has had H/A on and off and last H/A was last wk with pain level of 5 - 6. no fever and no S/T. Pt had pfizer vaccines 08/07/19 and 08/30/19. Pt wants to schedule appt on 03/08/20 at [facility]; offered virtual appt on 12/07/19 but pt said no she wants someone to look in her ear. Pt said the UC is more expensive than the walk in for her ins. Pt will go to [facility] either today or tomorrow. FYI to DR [facility]. Access nurse note not in portal yet will add when available.

## 2020-03-08 NOTE — Telephone Encounter (Signed)
Okay to change this appt to in person... let her know we can discuss imaging at the appt after I examine her.

## 2020-03-09 ENCOUNTER — Ambulatory Visit
Admission: RE | Admit: 2020-03-09 | Discharge: 2020-03-09 | Disposition: A | Discharge status: Home / Self Care | Payer: BC Managed Care – PPO | Source: Ambulatory Visit | Attending: Surgery | Admitting: Surgery

## 2020-03-09 ENCOUNTER — Other Ambulatory Visit: Payer: Self-pay

## 2020-03-09 DIAGNOSIS — R109 Unspecified abdominal pain: Secondary | ICD-10-CM

## 2020-03-09 MED ORDER — IOHEXOL 300 MG/ML  SOLN
100.0000 mL | Freq: Once | INTRAMUSCULAR | Status: AC | PRN
  Administered 2020-03-09: 100 mL via INTRAVENOUS

## 2020-03-11 ENCOUNTER — Ambulatory Visit (INDEPENDENT_AMBULATORY_CARE_PROVIDER_SITE_OTHER): Payer: BC Managed Care – PPO | Admitting: Family Medicine

## 2020-03-11 ENCOUNTER — Encounter: Payer: Self-pay | Admitting: Family Medicine

## 2020-03-11 ENCOUNTER — Other Ambulatory Visit: Payer: Self-pay

## 2020-03-11 DIAGNOSIS — I10 Essential (primary) hypertension: Secondary | ICD-10-CM

## 2020-03-11 DIAGNOSIS — L989 Disorder of the skin and subcutaneous tissue, unspecified: Secondary | ICD-10-CM | POA: Diagnosis not present

## 2020-03-11 DIAGNOSIS — R110 Nausea: Secondary | ICD-10-CM | POA: Diagnosis not present

## 2020-03-11 DIAGNOSIS — H9201 Otalgia, right ear: Secondary | ICD-10-CM

## 2020-03-11 NOTE — Patient Instructions (Addendum)
Contact Dr. A-E reguarding blood pressure meds and control.  Give esophagus time for swelling to repair.  Restart Xyzal at night.  Continue flonase 2 sprays per nostril daily.   Consider seeing [facility]... for consult on  Mass on scalp.

## 2020-03-11 NOTE — Progress Notes (Signed)
Chief Complaint  Patient presents with  . Knot on Head  . Dizziness  . Nausea  . Facial Numbness    History of Present Illness: HPI    64 year old female with history of meningioma presents for a knot on her head. Has pressure on left eye, numbness on left cheek off and on for 2 weeks. No nasal congestion, no fever, no allergy symptoms.  eye exam normal.. set up with progressive lens.. had stop.   She has also recently had hiatal hernia repair.  Recent CT with Dr. Hassell Done Incidentally showed: Mild circumferential wall thickening in the distal esophagus may reflect esophagitis.... has swelling feeling in throat.  ON pantoprazole BID.  Left eye light sensitivity   Post nasal drip and ? Fluid in ears.   Followed by cardiology for SVT on diltiazem 180 mg twice dialy 03/03/2020 Stopped losartan as possibly causing nausea.. increased diltiazem to control BP.  Nausea is better  At home BPs 140/80  BP Readings from Last 3 Encounters:  03/11/20 (!) 160/90  03/03/20 128/88  02/24/20 124/70    This visit occurred during the SARS-CoV-2 public health emergency.  Safety protocols were in place, including screening questions prior to the visit, additional usage of staff PPE, and extensive cleaning of exam room while observing appropriate contact time as indicated for disinfecting solutions.   COVID 19 screen:  No recent travel or known exposure to COVID19 The patient denies respiratory symptoms of COVID 19 at this time. The importance of social distancing was discussed today.     Review of Systems  Constitutional: Negative for chills and fever.  HENT: Negative for congestion and ear pain.   Eyes: Negative for pain and redness.  Respiratory: Negative for cough and shortness of breath.   Cardiovascular: Negative for chest pain, palpitations and leg swelling.  Gastrointestinal: Positive for nausea. Negative for abdominal pain, blood in stool, constipation, diarrhea and vomiting.   Genitourinary: Negative for dysuria.  Musculoskeletal: Negative for falls and myalgias.  Skin: Negative for rash.  Neurological: Positive for dizziness.  Psychiatric/Behavioral: Negative for depression. The patient is not nervous/anxious.       Past Medical History:  Diagnosis Date  . ALLERGIC RHINITIS 04/03/2007  . Anemia   . Anxiety   . Asthma    hx of years ago - no longer a problem   . ASTHMA, PERSISTENT, MODERATE 04/03/2007  . Cancer ([place])    skin   . Dyspnea on exertion    a. 11/2007 Echo: EF 60%.  . Dysrhythmia    hx of heart arrhythmia 10-15 years ago - followed by DR Lovena Le - not seen in years   . GERD (gastroesophageal reflux disease)   . 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 Hypertension   . Joint pain    HIPS / LEGS  . Knee injury    RT  . Meningioma ([place])   . Morbid obesity ([place])   . Paroxysmal SVT (supraventricular tachycardia) ([place])    a. 11/2011 48h Holter: RSR, rare PVC's, occas PAC's  . Retinal tear of right eye 01/2016  . Ventral hernia     reports that she has never smoked. She has never used smokeless tobacco. She reports previous alcohol use. She reports that she does not use drugs.   Current Outpatient Medications:  .  acetaminophen (TYLENOL) 500 MG tablet, Take 1,000 mg by mouth every 6 (six) hours as needed (for pain.)., Disp: , Rfl:  .  albuterol (PROAIR HFA) 108 (90 Base) MCG/ACT  inhaler, TAKE 2 PUFFS BY MOUTH EVERY 6 HOURS AS NEEDED, Disp: 8.5 Inhaler, Rfl: 11 .  Calcium Citrate-Vitamin D (CITRACAL + D PO), Take 2 tablets by mouth daily. , Disp: , Rfl:  .  Cyanocobalamin (VITAMIN B-12) 5000 MCG SUBL, Place 5,000 mcg under the tongue daily., Disp: , Rfl:  .  diltiazem (CARDIZEM CD) 120 MG 24 hr capsule, Take 1 capsule (120 mg total) by mouth in the morning and at bedtime., Disp: 60 capsule, Rfl: 5 .  ELDERBERRY PO, Take 1,000 mg by mouth daily. , Disp: , Rfl:  .  ferrous sulfate 325 (65 FE) MG EC tablet, Take 1 tablet (325 mg total) by mouth 2 (two) times daily  with a meal. (Patient taking differently: Take 325 mg by mouth daily. ), Disp: 60 tablet, Rfl: 3 .  fluticasone (FLONASE) 50 MCG/ACT nasal spray, SPRAY 2 SPRAYS INTO EACH NOSTRIL EVERY DAY, Disp: 48 mL, Rfl: 3 .  Multiple Vitamins-Minerals (OPURITY) TABS, Take 1 tablet by mouth daily., Disp: , Rfl:  .  nitroGLYCERIN (NITROSTAT) 0.4 MG SL tablet, Place 1 tablet (0.4 mg total) under the tongue every 5 (five) minutes as needed for chest pain., Disp: 30 tablet, Rfl: 0 .  Omega-3 Fatty Acids (FISH OIL) 1200 MG CAPS, Take 1,200 mg by mouth daily. , Disp: , Rfl:  .  ondansetron (ZOFRAN-ODT) 4 MG disintegrating tablet, Take by mouth., Disp: , Rfl:  .  pantoprazole (PROTONIX) 40 MG tablet, TAKE 1 TABLET BY MOUTH TWICE A DAY (Patient taking differently: Take 40 mg by mouth 2 (two) times daily. Morning & Mid-afternoon), Disp: 180 tablet, Rfl: 1 .  traMADol (ULTRAM) 50 MG tablet, Take 1 tablet (50 mg total) by mouth every 8 (eight) hours as needed., Disp: 10 tablet, Rfl: 0 .  TURMERIC PO, Take 1 capsule by mouth daily., Disp: , Rfl:  .  vitamin C (ASCORBIC ACID) 250 MG tablet, Take 250 mg by mouth daily., Disp: , Rfl:   Current Facility-Administered Medications:  .  methylPREDNISolone acetate (DEPO-MEDROL) injection 80 mg, 80 mg, Intramuscular, Once, Baity, Regina W, NP   Observations/Objective: Blood pressure (!) 160/90, pulse 81, temperature 97.8 F (36.6 C), temperature source Temporal, height 5\' 6"  (1.676 m), weight 241 lb (109.3 kg), SpO2 97 %.  Physical Exam Constitutional:      General: She is not in acute distress.    Appearance: Normal appearance. She is well-developed. She is obese. She is not ill-appearing or toxic-appearing.  HENT:     Head: Normocephalic.     Right Ear: Hearing, tympanic membrane, ear canal and external ear normal. Tympanic membrane is not erythematous, retracted or bulging.     Left Ear: Hearing, tympanic membrane, ear canal and external ear normal. Tympanic membrane is  not erythematous, retracted or bulging.     Nose: No mucosal edema or rhinorrhea.     Right Sinus: No maxillary sinus tenderness or frontal sinus tenderness.     Left Sinus: No maxillary sinus tenderness or frontal sinus tenderness.     Mouth/Throat:     Pharynx: Uvula midline.  Eyes:     General: Lids are normal. Lids are everted, no foreign bodies appreciated.     Conjunctiva/sclera: Conjunctivae normal.     Pupils: Pupils are equal, round, and reactive to light.  Neck:     Thyroid: No thyroid mass or thyromegaly.     Vascular: No carotid bruit.     Trachea: Trachea normal.  Cardiovascular:     Rate and  Rhythm: Normal rate and regular rhythm.     Pulses: Normal pulses.     Heart sounds: Normal heart sounds, S1 normal and S2 normal. No murmur heard.  No friction rub. No gallop.   Pulmonary:     Effort: Pulmonary effort is normal. No tachypnea or respiratory distress.     Breath sounds: Normal breath sounds. No decreased breath sounds, wheezing, rhonchi or rales.  Abdominal:     General: Bowel sounds are normal.     Palpations: Abdomen is soft.     Tenderness: There is no abdominal tenderness.  Musculoskeletal:     Cervical back: Normal range of motion and neck supple.  Skin:    General: Skin is warm and dry.     Findings: No rash.  Neurological:     Mental Status: She is alert.  Psychiatric:        Mood and Affect: Mood is not anxious or depressed.        Speech: Speech normal.        Behavior: Behavior normal. Behavior is cooperative.        Thought Content: Thought content normal.        Judgment: Judgment normal.      Assessment and Plan   HTN (hypertension) Inadequate control on current regimen. Contact Dr. A-E reguarding blood pressure meds and control.   Nausea Give esophagus time for swelling to repair.  Ear pain, right  Restart Xyzal at night.  Continue flonase 2 sprays per nostril daily.   Skin lesion of scalp Refer to derm for possible removal given  tender for patient despite no current infection.     [name], MD

## 2020-03-21 ENCOUNTER — Other Ambulatory Visit: Payer: Self-pay

## 2020-03-21 ENCOUNTER — Ambulatory Visit: Payer: BC Managed Care – PPO | Admitting: Cardiology

## 2020-03-21 ENCOUNTER — Encounter: Payer: Self-pay | Admitting: Cardiology

## 2020-03-21 ENCOUNTER — Ambulatory Visit (INDEPENDENT_AMBULATORY_CARE_PROVIDER_SITE_OTHER): Payer: BC Managed Care – PPO

## 2020-03-21 VITALS — BP 120/88 | HR 84 | Ht 66.0 in | Wt 240.0 lb

## 2020-03-21 DIAGNOSIS — E7800 Pure hypercholesterolemia, unspecified: Secondary | ICD-10-CM | POA: Diagnosis not present

## 2020-03-21 DIAGNOSIS — I10 Essential (primary) hypertension: Secondary | ICD-10-CM

## 2020-03-21 DIAGNOSIS — I471 Supraventricular tachycardia: Secondary | ICD-10-CM | POA: Diagnosis not present

## 2020-03-21 MED ORDER — DILTIAZEM HCL ER COATED BEADS 300 MG PO CP24: 300.0000 mg | ORAL_CAPSULE | Freq: Every day | ORAL | 5 refills | Status: DC

## 2020-03-21 NOTE — Patient Instructions (Signed)
Medication Instructions:   Your physician has recommended you make the following change in your medication:   1.  STOP taking . 2.  START taking diltiazem (CARDIZEM CD) 300 MG 24 hr capsule:Take 1 capsule (300 mg total) by mouth daily   *If you need a refill on your cardiac medications before your next appointment, please call your pharmacy*   Lab Work: None Ordered If you have labs (blood work) drawn today and your tests are completely normal, you will receive your results only by: Marland Kitchen MyChart Message (if you have MyChart) OR . A paper copy in the mail If you have any lab test that is abnormal or we need to change your treatment, we will call you to review the results.   Testing/Procedures:  Your physician has recommended that you wear a Zio monitor for 2 weeks. This monitor is a medical device that records the heart's electrical activity. Doctors most often use these monitors to diagnose arrhythmias. Arrhythmias are problems with the speed or rhythm of the heartbeat. The monitor is a small device applied to your chest. You can wear one while you do your normal daily activities. While wearing this monitor if you have any symptoms to push the button and record what you felt. Once you have worn this monitor for the period of time provider prescribed (Usually 14 days), you will return the monitor device in the postage paid box. Once it is returned they will download the data collected and provide [place] with a report which the provider will then review and we will call you with those results. Important tips:  1. Avoid showering during the first 24 hours of wearing the monitor. 2. Avoid excessive sweating to help maximize wear time. 3. Do not submerge the device, no hot tubs, and no swimming pools. 4. Keep any lotions or oils away from the patch. 5. After 24 hours you may shower with the patch on. Take brief showers with your back facing the shower head.  6. Do not remove patch once it has been  placed because that will interrupt data and decrease adhesive wear time. 7. Push the button when you have any symptoms and write down what you were feeling. 8. Once you have completed wearing your monitor, remove and place into box which has postage paid and place in your outgoing mailbox.  9. If for some reason you have misplaced your box then call our office and we can provide another box and/or mail it off for you.         Follow-Up: At [facility], you and your health needs are our priority.  As part of our continuing mission to provide you with exceptional heart care, we have created designated Provider Care Teams.  These Care Teams include your primary Cardiologist (physician) and Advanced Practice Providers (APPs -  Physician Assistants and Nurse Practitioners) who all work together to provide you with the care you need, when you need it.  We recommend signing up for the patient portal called "MyChart".  Sign up information is provided on this After Visit Summary.  MyChart is used to connect with patients for Virtual Visits (Telemedicine).  Patients are able to view lab/test results, encounter notes, upcoming appointments, etc.  Non-urgent messages can be sent to your provider as well.   To learn more about what you can do with MyChart, go to [email or web address].    Your next appointment:   5 week(s)  The format for your next appointment:   In  Person  Provider:   [name], MD   Other Instructions

## 2020-03-21 NOTE — Progress Notes (Signed)
Cardiology Office Note:    Date:  03/21/2020   ID:  Kimberly Patton, DOB 1955/07/27, MRN [id number]  PCP:  Jinny Sanders, MD  [facility] Cardiologist:  [name], MD  [place] Electrophysiologist:  None   Referring MD: Jinny Sanders, MD   Chief Complaint  Patient presents with  . Follow-up    Pt. c/o elevated blood pressure, headache, frequent urination, has a vibrating sensation in lower abdomen and rapid heart beats. Meds reviewed by the pt. verbally.     History of Present Illness:    Kimberly Patton is a 64 y.o. female with a hx of hypertension, hiatal hernia, GERD, iron deficiency anemia, SVT on diltiazem presenting for follow-up. Patient being seen due to hypertension and medication side effect. Losartan caused nausea, which was stopped. Cardizem increased to 120 mg twice daily. BP cuff was also noted to be small for her arm size.  Patient fell about 3 days ago bruising her left knee.  She states having symptoms of palpitations and elevated blood pressures the past 2 to 3 days.  Called the on-call physician last evening and was told to take her evening dose of Cardizem orally.  She has not had palpitations for years now.  Her previous dose of Cardizem was 300 mg daily back in 2012.  Has not had any symptoms of palpitations since then.   Prior notes Echocardiogram 01/2020 showed normal systolic and diastolic function, EF 60 to 65%. Lexiscan Myoview 01/2020 with no evidence for ischemia. Has a history of hypokalemia while taking HCTZ. Losartan was started after stopping HCTZ. Developed nausea with losartan. This was stopped.   Past Medical History:  Diagnosis Date  . ALLERGIC RHINITIS 04/03/2007  . Anemia   . Anxiety   . Asthma    hx of years ago - no longer a problem   . ASTHMA, PERSISTENT, MODERATE 04/03/2007  . Cancer ([place])    skin   . Dyspnea on exertion    a. 11/2007 Echo: EF 60%.  . Dysrhythmia    hx of heart arrhythmia 10-15 years ago - followed  by DR Lovena Le - not seen in years   . GERD (gastroesophageal reflux disease)   . 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 Hypertension   . Joint pain    HIPS / LEGS  . Knee injury    RT  . Meningioma ([place])   . Morbid obesity ([place])   . Paroxysmal SVT (supraventricular tachycardia) ([place])    a. 11/2011 48h Holter: RSR, rare PVC's, occas PAC's  . Retinal tear of right eye 01/2016  . Ventral hernia     Past Surgical History:  Procedure Laterality Date  . APPENDECTOMY  1996  . BREAST CYST EXCISION Right 1994   Benign  . CESAREAN SECTION     x2  . CHOLECYSTECTOMY  1995  . EYE SURGERY  01/2016   Repair retinal tear  . [place] / 1996   x2  . KNEE ARTHROSCOPY W/ MENISCAL REPAIR  07/25/13   right knee Dr. Mardelle Matte  . LAPAROSCOPIC GASTRIC SLEEVE RESECTION N/A 06/08/2013   Procedure: LAPAROSCOPIC GASTRIC SLEEVE RESECTION AND EXCISION OF SEBACEUS CYST FROM MID CHEST takedown of incarcerated ventral hernia and primary repair endoscopy;  Surgeon: [name], MD;  Location: [facility];  Service: General;  Laterality: N/A;  . LAPAROSCOPIC NISSEN FUNDOPLICATION N/A 10/26/6158   Procedure: LAPAROSCOPIC REPAIR LARGE SYMPTOMATIC HIATAL HERNIA WITH UPPER ENDOSCOPY;  Surgeon: Johnathan Hausen, MD;  Location: [facility];  Service: General;  Laterality: N/A;  . moles  06/2013   removed 2 moles from under arm and  lowback  . NECK SURGERY     occipital nerve damage- injections   . OVARIAN CYST REMOVAL  1970  . PILONIDAL CYST EXCISION  1975  . RETINAL TEAR REPAIR CRYOTHERAPY Right 01/2016   [place]  . SKIN CANCER EXCISION    . TONSILLECTOMY    . UPPER GI ENDOSCOPY  06/08/2013   Procedure: UPPER GI ENDOSCOPY;  Surgeon: [name], MD;  Location: [facility];  Service: General;;  . US ECHOCARDIOGRAPHY  12/2011   WNL - EF 55-60%, mild MR, grade 1 diastolic dysnfiction (mild)  . VENTRAL HERNIA REPAIR  2000    Current Medications: Current Meds  Medication Sig  . acetaminophen (TYLENOL) 500 MG tablet Take 1,000 mg  by mouth every 6 (six) hours as needed (for pain.).  Marland Kitchen albuterol (PROAIR HFA) 108 (90 Base) MCG/ACT inhaler TAKE 2 PUFFS BY MOUTH EVERY 6 HOURS AS NEEDED  . Calcium Citrate-Vitamin D (CITRACAL + D PO) Take 2 tablets by mouth daily.   . Cyanocobalamin (VITAMIN B-12) 5000 MCG SUBL Place 5,000 mcg under the tongue daily.  Marland Kitchen ELDERBERRY PO Take 1,000 mg by mouth daily.   . ferrous sulfate 325 (65 FE) MG EC tablet Take 1 tablet (325 mg total) by mouth 2 (two) times daily with a meal. (Patient taking differently: Take 325 mg by mouth daily. )  . fluticasone (FLONASE) 50 MCG/ACT nasal spray SPRAY 2 SPRAYS INTO EACH NOSTRIL EVERY DAY  . Multiple Vitamins-Minerals (OPURITY) TABS Take 1 tablet by mouth daily.  . nitroGLYCERIN (NITROSTAT) 0.4 MG SL tablet Place 1 tablet (0.4 mg total) under the tongue every 5 (five) minutes as needed for chest pain.  . Omega-3 Fatty Acids (FISH OIL) 1200 MG CAPS Take 1,200 mg by mouth daily.   . ondansetron (ZOFRAN-ODT) 4 MG disintegrating tablet Take by mouth.  . pantoprazole (PROTONIX) 40 MG tablet TAKE 1 TABLET BY MOUTH TWICE A DAY (Patient taking differently: Take 40 mg by mouth 2 (two) times daily. Morning & Mid-afternoon)  . traMADol (ULTRAM) 50 MG tablet Take 1 tablet (50 mg total) by mouth every 8 (eight) hours as needed.  . TURMERIC PO Take 1 capsule by mouth daily.  . vitamin C (ASCORBIC ACID) 250 MG tablet Take 250 mg by mouth daily.  . [DISCONTINUED] diltiazem (CARDIZEM CD) 120 MG 24 hr capsule Take 1 capsule (120 mg total) by mouth in the morning and at bedtime.   Current Facility-Administered Medications for the 03/21/20 encounter (Office Visit) with [name], MD  Medication  . methylPREDNISolone acetate (DEPO-MEDROL) injection 80 mg     Allergies:   Wasp venom, Chlorhexidine gluconate, Prednisone, and Sodium hypochlorite   Social History   Socioeconomic History  . Marital status: Divorced    Spouse name: Not on file  . Number of children: 2    . Years of education: 76  . Highest education level: Not on file  Occupational History  . Occupation: Product manager: [employer]  Tobacco Use  . Smoking status: Never Smoker  . Smokeless tobacco: Never Used  . Tobacco comment: smoke at age 18-12  Vaping Use  . Vaping Use: Never used  Substance and Sexual Activity  . Alcohol use: Not Currently    Alcohol/week: 0.0 standard drinks  . Drug use: No  . Sexual activity: Not on file  Other Topics Concern  .  Not on file  Social History Narrative   Lives at home alone.   Right-handed.   Occasional caffeine use.   Social Determinants of Health   Financial Resource Strain:   . Difficulty of Paying Living Expenses: Not on file  Food Insecurity:   . Worried About Charity fundraiser in the Last Year: Not on file  . Ran Out of Food in the Last Year: Not on file  Transportation Needs:   . Lack of Transportation (Medical): Not on file  . Lack of Transportation (Non-Medical): Not on file  Physical Activity:   . Days of Exercise per Week: Not on file  . Minutes of Exercise per Session: Not on file  Stress:   . Feeling of Stress : Not on file  Social Connections:   . Frequency of Communication with Friends and Family: Not on file  . Frequency of Social Gatherings with Friends and Family: Not on file  . Attends Religious Services: Not on file  . Active Member of Clubs or Organizations: Not on file  . Attends Archivist Meetings: Not on file  . Marital Status: Not on file     Family History: The patient's family history includes Breast cancer in her mother; Diabetes in her father, mother, and another family member; Hemophilia in an other family member; Hypertension in her father, mother, and another family member; Kidney failure in her mother; Stroke in an other family member.  ROS:   Please see the history of present illness.     All other systems reviewed and are negative.  EKGs/Labs/Other Studies  Reviewed:    The following studies were reviewed today:   EKG:  EKG is  ordered today.  The ekg ordered today demonstrates normal sinus rhythm.  Recent Labs: 11/23/2019: TSH 0.66 01/10/2020: Magnesium 2.1 02/24/2020: ALT 15; BUN 19; Creatinine, Ser 0.57; Hemoglobin 12.3; Platelets 287.0; Potassium 3.9; Sodium 139  Recent Lipid Panel    Component Value Date/Time   CHOL 209 (H) 06/02/2019 0804   TRIG 111.0 06/02/2019 0804   HDL 63.10 06/02/2019 0804   CHOLHDL 3 06/02/2019 0804   VLDL 22.2 06/02/2019 0804   LDLCALC 124 (H) 06/02/2019 0804    Physical Exam:    VS:  BP 120/88 (BP Location: Right Arm, Patient Position: Sitting, Cuff Size: Large)   Pulse 84   Ht 5\' 6"  (1.676 m)   Wt 240 lb (108.9 kg)   SpO2 98%   BMI 38.74 kg/m     Wt Readings from Last 3 Encounters:  03/21/20 240 lb (108.9 kg)  03/11/20 241 lb (109.3 kg)  03/03/20 238 lb (108 kg)     GEN:  Well nourished, well developed in no acute distress, obese HEENT: Normal NECK: No JVD; No carotid bruits LYMPHATICS: No lymphadenopathy CARDIAC: RRR, no murmurs, rubs, gallops RESPIRATORY:  Clear to auscultation without rales, wheezing or rhonchi  ABDOMEN: Soft, non-tender, non-distended MUSCULOSKELETAL: Lower extremity lymphedema noted, left knee erythema noted SKIN: Warm and dry NEUROLOGIC:  Alert and oriented x 3 PSYCHIATRIC:  Normal affect   ASSESSMENT:    1. Essential hypertension   2. Paroxysmal SVT (supraventricular tachycardia) (HCC)   3. Pure hypercholesterolemia    PLAN:    In order of problems listed above:   1. Patient with history of hypertension.  BP controlled.  Start Cardizem CD 300 mg daily due to palpitations. 2. History of paroxysmal SVT, recent palpitations noted.  Get cardiac monitor x2 weeks to evaluate for any arrhythmias or  SVT recurrence.  Increase Cardizem to previous dose of 300 mg daily when her SVT was controlled.  Last echo with normal EF. 3. History of hyperlipidemia, 10-year ASCVD  risk of 5.7%.  Patient not in statin benefit group.  Low-cholesterol diet advised.  Follow-up after cardiac monitor.  Total encounter time more than 40 minutes  Greater than 50% was spent in counseling and coordination of care with the patient   This note was generated in part or whole with voice recognition software. Voice recognition is usually quite accurate but there are transcription errors that can and very often do occur. I apologize for any typographical errors that were not detected and corrected.  Medication Adjustments/Labs and Tests Ordered: Current medicines are reviewed at length with the patient today.  Concerns regarding medicines are outlined above.  Orders Placed This Encounter  Procedures  . LONG TERM MONITOR (3-14 DAYS)  . EKG 12-Lead   Meds ordered this encounter  Medications  . diltiazem (CARDIZEM CD) 300 MG 24 hr capsule    Sig: Take 1 capsule (300 mg total) by mouth daily.    Dispense:  30 capsule    Refill:  5    Patient Instructions  Medication Instructions:   Your physician has recommended you make the following change in your medication:   1.  STOP taking . 2.  START taking diltiazem (CARDIZEM CD) 300 MG 24 hr capsule:Take 1 capsule (300 mg total) by mouth daily   *If you need a refill on your cardiac medications before your next appointment, please call your pharmacy*   Lab Work: None Ordered If you have labs (blood work) drawn today and your tests are completely normal, you will receive your results only by: Marland Kitchen MyChart Message (if you have MyChart) OR . A paper copy in the mail If you have any lab test that is abnormal or we need to change your treatment, we will call you to review the results.   Testing/Procedures:  Your physician has recommended that you wear a Zio monitor for 2 weeks. This monitor is a medical device that records the heart's electrical activity. Doctors most often use these monitors to diagnose arrhythmias. Arrhythmias  are problems with the speed or rhythm of the heartbeat. The monitor is a small device applied to your chest. You can wear one while you do your normal daily activities. While wearing this monitor if you have any symptoms to push the button and record what you felt. Once you have worn this monitor for the period of time provider prescribed (Usually 14 days), you will return the monitor device in the postage paid box. Once it is returned they will download the data collected and provide [place] with a report which the provider will then review and we will call you with those results. Important tips:  1. Avoid showering during the first 24 hours of wearing the monitor. 2. Avoid excessive sweating to help maximize wear time. 3. Do not submerge the device, no hot tubs, and no swimming pools. 4. Keep any lotions or oils away from the patch. 5. After 24 hours you may shower with the patch on. Take brief showers with your back facing the shower head.  6. Do not remove patch once it has been placed because that will interrupt data and decrease adhesive wear time. 7. Push the button when you have any symptoms and write down what you were feeling. 8. Once you have completed wearing your monitor, remove and place into box which  has postage paid and place in your outgoing mailbox.  9. If for some reason you have misplaced your box then call our office and we can provide another box and/or mail it off for you.         Follow-Up: At [facility], you and your health needs are our priority.  As part of our continuing mission to provide you with exceptional heart care, we have created designated Provider Care Teams.  These Care Teams include your primary Cardiologist (physician) and Advanced Practice Providers (APPs -  Physician Assistants and Nurse Practitioners) who all work together to provide you with the care you need, when you need it.  We recommend signing up for the patient portal called "MyChart".  Sign  up information is provided on this After Visit Summary.  MyChart is used to connect with patients for Virtual Visits (Telemedicine).  Patients are able to view lab/test results, encounter notes, upcoming appointments, etc.  Non-urgent messages can be sent to your provider as well.   To learn more about what you can do with MyChart, go to [email or web address].    Your next appointment:   5 week(s)  The format for your next appointment:   In Person  Provider:   [name], MD   Other Instructions      Signed, [name], MD  03/21/2020 12:27 PM    [place]

## 2020-03-23 ENCOUNTER — Other Ambulatory Visit: Payer: Self-pay | Admitting: Family Medicine

## 2020-03-23 DIAGNOSIS — Z1231 Encounter for screening mammogram for malignant neoplasm of breast: Secondary | ICD-10-CM

## 2020-03-25 ENCOUNTER — Telehealth (INDEPENDENT_AMBULATORY_CARE_PROVIDER_SITE_OTHER): Payer: BC Managed Care – PPO | Admitting: Family Medicine

## 2020-03-25 ENCOUNTER — Other Ambulatory Visit: Payer: Self-pay

## 2020-03-25 DIAGNOSIS — R300 Dysuria: Secondary | ICD-10-CM | POA: Diagnosis not present

## 2020-03-25 DIAGNOSIS — I10 Essential (primary) hypertension: Secondary | ICD-10-CM | POA: Diagnosis not present

## 2020-03-25 DIAGNOSIS — R110 Nausea: Secondary | ICD-10-CM | POA: Diagnosis not present

## 2020-03-25 DIAGNOSIS — D509 Iron deficiency anemia, unspecified: Secondary | ICD-10-CM | POA: Diagnosis not present

## 2020-03-25 LAB — POC URINALSYSI DIPSTICK (AUTOMATED)
Bilirubin, UA: NEGATIVE
Blood, UA: NEGATIVE
Glucose, UA: NEGATIVE
Ketones, UA: NEGATIVE
Nitrite, UA: NEGATIVE
Protein, UA: NEGATIVE
Spec Grav, UA: 1.015 (ref 1.010–1.025)
Urobilinogen, UA: 0.2 E.U./dL
pH, UA: 6.5 (ref 5.0–8.0)

## 2020-03-25 NOTE — Assessment & Plan Note (Signed)
Well controlled. Continue current medication.  

## 2020-03-25 NOTE — Progress Notes (Signed)
VIRTUAL VISIT Due to national recommendations of social distancing due to [place] 19, a virtual visit is felt to be most appropriate for this patient at this time.   I connected with the patient on 03/25/20 at 11:40 AM EDT by virtual telehealth platform and verified that I am speaking with the correct person using two identifiers.   I discussed the limitations, risks, security and privacy concerns of performing an evaluation and management service by  virtual telehealth platform and the availability of in person appointments. I also discussed with the patient that there may be a patient responsible charge related to this service. The patient expressed understanding and agreed to proceed.  Patient location: Home Provider Location: [place] Participants: Zebadiah Willert Diona Browner and SHEVON SIAN    History of Present Illness:  64 year old female presents with new onset with new onset urinary frequency x 1 week.  Dysuria x 1 week off and on.. after urination.  Right low abd pain off and on in last few days.  She continues to be nausea.. recent hiatal hernia surgery.  no fever.    She has been put  diltiazem 300 mg given HR was elevated.... BP now better controlled per cardiology.  Not on diuretic. BP Readings from Last 3 Encounters:  03/21/20 120/88  03/11/20 (!) 160/90  03/03/20 128/88     COVID 19 screen No recent travel or known exposure to COVID19 The patient denies respiratory symptoms of COVID 19 at this time.  The importance of social distancing was discussed today.   Review of Systems  Constitutional: Negative for chills and fever.  HENT: Negative for congestion and ear pain.   Eyes: Negative for pain and redness.  Respiratory: Negative for cough and shortness of breath.   Cardiovascular: Negative for chest pain, palpitations and leg swelling.  Gastrointestinal: Positive for abdominal pain and nausea. Negative for blood in stool, constipation, diarrhea and vomiting.   Genitourinary: Positive for dysuria and frequency.  Musculoskeletal: Negative for falls and myalgias.  Skin: Negative for rash.  Neurological: Negative for dizziness.  Psychiatric/Behavioral: Negative for depression. The patient is not nervous/anxious.       Past Medical History:  Diagnosis Date  . ALLERGIC RHINITIS 04/03/2007  . Anemia   . Anxiety   . Asthma    hx of years ago - no longer a problem   . ASTHMA, PERSISTENT, MODERATE 04/03/2007  . Cancer ([place])    skin   . Dyspnea on exertion    a. 11/2007 Echo: EF 60%.  . Dysrhythmia    hx of heart arrhythmia 10-15 years ago - followed by DR Lovena Le - not seen in years   . GERD (gastroesophageal reflux disease)   . 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 Hypertension   . Joint pain    HIPS / LEGS  . Knee injury    RT  . Meningioma ([place])   . Morbid obesity ([place])   . Paroxysmal SVT (supraventricular tachycardia) ([place])    a. 11/2011 48h Holter: RSR, rare PVC's, occas PAC's  . Retinal tear of right eye 01/2016  . Ventral hernia     reports that she has never smoked. She has never used smokeless tobacco. She reports previous alcohol use. She reports that she does not use drugs.   Current Outpatient Medications:  .  acetaminophen (TYLENOL) 500 MG tablet, Take 1,000 mg by mouth every 6 (six) hours as needed (for pain.)., Disp: , Rfl:  .  albuterol (PROAIR HFA) 108 (90  Base) MCG/ACT inhaler, TAKE 2 PUFFS BY MOUTH EVERY 6 HOURS AS NEEDED, Disp: 8.5 Inhaler, Rfl: 11 .  Calcium Citrate-Vitamin D (CITRACAL + D PO), Take 2 tablets by mouth daily. , Disp: , Rfl:  .  Cyanocobalamin (VITAMIN B-12) 5000 MCG SUBL, Place 5,000 mcg under the tongue daily., Disp: , Rfl:  .  diltiazem (CARDIZEM CD) 300 MG 24 hr capsule, Take 1 capsule (300 mg total) by mouth daily., Disp: 30 capsule, Rfl: 5 .  ELDERBERRY PO, Take 1,000 mg by mouth daily. , Disp: , Rfl:  .  ferrous sulfate 325 (65 FE) MG EC tablet, Take 1 tablet (325 mg total) by mouth 2 (two) times daily with a  meal. (Patient taking differently: Take 325 mg by mouth daily. ), Disp: 60 tablet, Rfl: 3 .  fluticasone (FLONASE) 50 MCG/ACT nasal spray, SPRAY 2 SPRAYS INTO EACH NOSTRIL EVERY DAY, Disp: 48 mL, Rfl: 3 .  Multiple Vitamins-Minerals (OPURITY) TABS, Take 1 tablet by mouth daily., Disp: , Rfl:  .  nitroGLYCERIN (NITROSTAT) 0.4 MG SL tablet, Place 1 tablet (0.4 mg total) under the tongue every 5 (five) minutes as needed for chest pain., Disp: 30 tablet, Rfl: 0 .  Omega-3 Fatty Acids (FISH OIL) 1200 MG CAPS, Take 1,200 mg by mouth daily. , Disp: , Rfl:  .  ondansetron (ZOFRAN-ODT) 4 MG disintegrating tablet, Take by mouth., Disp: , Rfl:  .  pantoprazole (PROTONIX) 40 MG tablet, TAKE 1 TABLET BY MOUTH TWICE A DAY (Patient taking differently: Take 40 mg by mouth 2 (two) times daily. Morning & Mid-afternoon), Disp: 180 tablet, Rfl: 1 .  traMADol (ULTRAM) 50 MG tablet, Take 1 tablet (50 mg total) by mouth every 8 (eight) hours as needed., Disp: 10 tablet, Rfl: 0 .  TURMERIC PO, Take 1 capsule by mouth daily., Disp: , Rfl:  .  vitamin C (ASCORBIC ACID) 250 MG tablet, Take 250 mg by mouth daily., Disp: , Rfl:   Current Facility-Administered Medications:  .  methylPREDNISolone acetate (DEPO-MEDROL) injection 80 mg, 80 mg, Intramuscular, Once, Baity, Coralie Keens, NP   Observations/Objective:    Physical Exam  Physical Exam Constitutional:      General: The patient is not in acute distress. Pulmonary:     Effort: Pulmonary effort is normal. No respiratory distress.  Neurological:     Mental Status: The patient is alert and oriented to person, place, and time.  Psychiatric:        Mood and Affect: Mood normal.        Behavior: Behavior normal.   Assessment and Plan  Iron deficiency anemia No known blood loss. Stop iron and re-eval in 3 weeks.  HTN (hypertension) Well controlled. Continue current medication.   Nausea  May be secondary to recent surgery for hiatal hernia and stomach changes...  stop iron.  Given no reflux any longer decrease and wean down PPI.     I discussed the assessment and treatment plan with the patient. The patient was provided an opportunity to ask questions and all were answered. The patient agreed with the plan and demonstrated an understanding of the instructions.   The patient was advised to call back or seek an in-person evaluation if the symptoms worsen or if the condition fails to improve as anticipated.     [name], MD

## 2020-03-25 NOTE — Patient Instructions (Addendum)
Stop iron given causing possible nausea.  Return for labs  to [facility] and and iron panel  in 3 weeks.  Decrease pantoprazole to one a day x 2-4 weeks then try every other day x 2 week then off.  We will call you with urine culture.

## 2020-03-25 NOTE — Assessment & Plan Note (Signed)
May be secondary to recent surgery for hiatal hernia and stomach changes... stop iron.  Given no reflux any longer decrease and wean down PPI.

## 2020-03-25 NOTE — Assessment & Plan Note (Signed)
No known blood loss. Stop iron and re-eval in 3 weeks.

## 2020-03-26 LAB — URINE CULTURE
MICRO NUMBER:: 11077629
SPECIMEN QUALITY:: ADEQUATE

## 2020-03-30 ENCOUNTER — Other Ambulatory Visit: Payer: Self-pay | Admitting: Family Medicine

## 2020-03-31 ENCOUNTER — Other Ambulatory Visit
Admission: RE | Admit: 2020-03-31 | Discharge: 2020-03-31 | Disposition: A | Discharge status: Home / Self Care | Payer: BC Managed Care – PPO | Attending: Ophthalmology | Admitting: Ophthalmology

## 2020-03-31 ENCOUNTER — Other Ambulatory Visit: Payer: Self-pay

## 2020-03-31 DIAGNOSIS — H2000 Unspecified acute and subacute iridocyclitis: Secondary | ICD-10-CM | POA: Diagnosis not present

## 2020-03-31 LAB — CBC WITH DIFFERENTIAL/PLATELET
Abs Immature Granulocytes: 0.01 10*3/uL (ref 0.00–0.07)
Basophils Absolute: 0.1 10*3/uL (ref 0.0–0.1)
Basophils Relative: 1 %
Eosinophils Absolute: 0.1 10*3/uL (ref 0.0–0.5)
Eosinophils Relative: 2 %
HCT: 38.2 % (ref 36.0–46.0)
Hemoglobin: 12.3 g/dL (ref 12.0–15.0)
Immature Granulocytes: 0 %
Lymphocytes Relative: 26 %
Lymphs Abs: 1.4 10*3/uL (ref 0.7–4.0)
MCH: 28 pg (ref 26.0–34.0)
MCHC: 32.2 g/dL (ref 30.0–36.0)
MCV: 86.8 fL (ref 80.0–100.0)
Monocytes Absolute: 0.4 10*3/uL (ref 0.1–1.0)
Monocytes Relative: 7 %
Neutro Abs: 3.6 10*3/uL (ref 1.7–7.7)
Neutrophils Relative %: 64 %
Platelets: 324 10*3/uL (ref 150–400)
RBC: 4.4 MIL/uL (ref 3.87–5.11)
RDW: 15.5 % (ref 11.5–15.5)
WBC: 5.6 10*3/uL (ref 4.0–10.5)
nRBC: 0 % (ref 0.0–0.2)

## 2020-03-31 LAB — SEDIMENTATION RATE: Sed Rate: 27 mm/hr (ref 0–30)

## 2020-04-01 LAB — ANGIOTENSIN CONVERTING ENZYME: Angiotensin-Converting Enzyme: 33 U/L (ref 14–82)

## 2020-04-02 LAB — QUANTIFERON-TB GOLD PLUS (RQFGPL)
QuantiFERON Mitogen Value: 5.2 IU/mL
QuantiFERON Nil Value: 0.05 IU/mL
QuantiFERON TB1 Ag Value: 0.06 IU/mL
QuantiFERON TB2 Ag Value: 0.04 IU/mL

## 2020-04-02 LAB — QUANTIFERON-TB GOLD PLUS: QuantiFERON-TB Gold Plus: NEGATIVE

## 2020-04-03 LAB — LYSOZYME, SERUM: Lysozyme: 4.5 ug/mL (ref 2.5–12.9)

## 2020-04-04 ENCOUNTER — Other Ambulatory Visit: Payer: Self-pay | Admitting: Ophthalmology

## 2020-04-04 ENCOUNTER — Ambulatory Visit
Admission: RE | Admit: 2020-04-04 | Discharge: 2020-04-04 | Disposition: A | Discharge status: Home / Self Care | Payer: BC Managed Care – PPO | Source: Ambulatory Visit | Attending: Ophthalmology | Admitting: Ophthalmology

## 2020-04-04 DIAGNOSIS — H2000 Unspecified acute and subacute iridocyclitis: Secondary | ICD-10-CM

## 2020-04-04 LAB — LYME DISEASE, WESTERN BLOT
IgG P18 Ab.: ABSENT
IgG P23 Ab.: ABSENT
IgG P28 Ab.: ABSENT
IgG P30 Ab.: ABSENT
IgG P39 Ab.: ABSENT
IgG P45 Ab.: ABSENT
IgG P58 Ab.: ABSENT
IgG P66 Ab.: ABSENT
IgG P93 Ab.: ABSENT
IgM P23 Ab.: ABSENT
IgM P39 Ab.: ABSENT
IgM P41 Ab.: ABSENT
Lyme IgG Wb: NEGATIVE
Lyme IgM Wb: NEGATIVE

## 2020-04-07 LAB — MISC LABCORP TEST (SEND OUT): Labcorp test code: 6924

## 2020-04-08 ENCOUNTER — Ambulatory Visit
Admission: RE | Admit: 2020-04-08 | Discharge: 2020-04-08 | Disposition: A | Discharge status: Home / Self Care | Payer: BC Managed Care – PPO | Source: Ambulatory Visit | Attending: Family Medicine | Admitting: Family Medicine

## 2020-04-08 ENCOUNTER — Other Ambulatory Visit: Payer: Self-pay

## 2020-04-08 DIAGNOSIS — Z1231 Encounter for screening mammogram for malignant neoplasm of breast: Secondary | ICD-10-CM

## 2020-04-14 ENCOUNTER — Ambulatory Visit: Payer: BC Managed Care – PPO | Admitting: Cardiology

## 2020-04-14 ENCOUNTER — Telehealth: Payer: Self-pay

## 2020-04-14 DIAGNOSIS — I471 Supraventricular tachycardia: Secondary | ICD-10-CM

## 2020-04-14 NOTE — Telephone Encounter (Signed)
Spoke with patient and relayed the following result note from Dr. Garen Lah:  "Patient with known history of SVTs. Occasional paroxysmal SVTs were noted on cardiac monitor. Continue Cardizem as prescribed. Keep follow-up appointment. Please refer patient to EP/Dr. Quentin Ore for additional input. Keep follow-up appointment with myself."  Patient verbalized understanding and agreed with plan. Referral was ordered and routed to scheduling.

## 2020-04-14 NOTE — Telephone Encounter (Signed)
FYI: LVM stating that her providers are not in the office on the same days and we are unable to make that request work.

## 2020-04-14 NOTE — Telephone Encounter (Signed)
Dr. Quentin Ore is only here on Wednesdays, we would not be able to do the 15th.

## 2020-04-18 ENCOUNTER — Encounter: Payer: Self-pay | Admitting: Family Medicine

## 2020-04-18 ENCOUNTER — Ambulatory Visit (INDEPENDENT_AMBULATORY_CARE_PROVIDER_SITE_OTHER): Payer: BC Managed Care – PPO | Admitting: Family Medicine

## 2020-04-18 ENCOUNTER — Other Ambulatory Visit: Payer: Self-pay

## 2020-04-18 VITALS — BP 130/80 | HR 70 | Temp 97.7°F | Ht 66.0 in | Wt 239.3 lb

## 2020-04-18 DIAGNOSIS — R110 Nausea: Secondary | ICD-10-CM

## 2020-04-18 DIAGNOSIS — I889 Nonspecific lymphadenitis, unspecified: Secondary | ICD-10-CM | POA: Diagnosis not present

## 2020-04-18 NOTE — Patient Instructions (Addendum)
For isolated left neck swollen glands, I recommend giving this some time, watching for new symptoms developing. Tylenol for discomfort. Use warm compresses to tender area. Usually this improves over time, if ongoing past 3-4 weeks let us know for further evaluation  For nausea, back off fish oil. We will see what heart doctor thinks about dilt. Labs checked today.

## 2020-04-18 NOTE — Assessment & Plan Note (Signed)
Refer to derm for possible removal given tender for patient despite no current infection.

## 2020-04-18 NOTE — Progress Notes (Signed)
This visit was conducted in person.  BP 130/80 (BP Location: Left Arm, Patient Position: Sitting, Cuff Size: Large)   Pulse 70   Temp 97.7 F (36.5 C) (Temporal)   Ht 5\' 6"  (1.676 m)   Wt 239 lb 5 oz (108.6 kg)   SpO2 99%   BMI 38.63 kg/m    CC: L neck swelling, nausea Subjective:    Patient ID: Kimberly Patton, female    DOB: [birth year], 64 y.o.   MRN: [id number]  HPI: Kimberly Patton is a 64 y.o. female presenting on 04/18/2020 for Lymphadenopathy (C/o swelling in glands in left neck.  Noticed about 2 days ago.  Also, c/o nausea for 2 mos. )   2 d h/o L cervical swollen glands associated sense of tender fullness, PNdrainage.  No fevers/chills, ST, cough, or wheeze.   H/o thyroid nodule, closely monitored (latest ultrasound 01/2020 showed unchanged).   COVID vaccine 2/201, 08/2019. Has not felt well since then.   HH Nissen fundoplication surgery 07/5850 (Dr Hassell Done).  H/o gastric sleeve ~2013 with resultant 100+ lb weight loss.  Known significant abdominal adhesions from prior surgeries.   Staying nauseated for 2 months since surgery - she stopped iron. Zofran not helping. She also stopped turmeric. rec hold fish oil at this time. Diltiazem recently increased to 300mg  daily - she wonders if this is related to higher dilt dose - has cards f/u scheduled next week. No vomiting. No GERD symptoms at this time. Some chronic constipation.   Recent CT abd/pelvis with contrast reassuring (02/2020). S/p cholecystectomy. Did have mild circumferential wall thickening of distal esophagus. Managing as possible H pylori with amoxicillin and omeprazole course (taking culturelle probiotic with this). Previously was on pantoprazole 40mg  bid.      Relevant past medical, surgical, family and social history reviewed and updated as indicated. Interim medical history since our last visit reviewed. Allergies and medications reviewed and updated. Outpatient Medications Prior to Visit  Medication Sig  Dispense Refill  . acetaminophen (TYLENOL) 500 MG tablet Take 1,000 mg by mouth every 6 (six) hours as needed (for pain.).    Marland Kitchen albuterol (PROAIR HFA) 108 (90 Base) MCG/ACT inhaler TAKE 2 PUFFS BY MOUTH EVERY 6 HOURS AS NEEDED 8.5 Inhaler 11  . amoxicillin (AMOXIL) 500 MG capsule Take 1,000 mg by mouth 2 (two) times daily.    . Calcium Citrate-Vitamin D (CITRACAL + D PO) Take 2 tablets by mouth daily.     . Cyanocobalamin (VITAMIN B-12) 5000 MCG SUBL Place 5,000 mcg under the tongue daily.    Marland Kitchen diltiazem (CARDIZEM CD) 300 MG 24 hr capsule Take 1 capsule (300 mg total) by mouth daily. 30 capsule 5  . ELDERBERRY PO Take 1,000 mg by mouth daily.     . fluticasone (FLONASE) 50 MCG/ACT nasal spray SPRAY 2 SPRAYS INTO EACH NOSTRIL EVERY DAY 48 mL 3  . Multiple Vitamins-Minerals (OPURITY) TABS Take 1 tablet by mouth daily.    . Omega-3 Fatty Acids (FISH OIL) 1200 MG CAPS Take 1,200 mg by mouth daily.     . ondansetron (ZOFRAN-ODT) 4 MG disintegrating tablet Take by mouth.    . traMADol (ULTRAM) 50 MG tablet Take 1 tablet (50 mg total) by mouth every 8 (eight) hours as needed. 10 tablet 0  . vitamin C (ASCORBIC ACID) 250 MG tablet Take 250 mg by mouth daily.    . nitroGLYCERIN (NITROSTAT) 0.4 MG SL tablet Place 1 tablet (0.4 mg total) under the tongue every 5 (  five) minutes as needed for chest pain. 30 tablet 0  . pantoprazole (PROTONIX) 40 MG tablet TAKE 1 TABLET BY MOUTH TWICE A DAY (Patient not taking: Reported on 04/18/2020) 180 tablet 1  . ferrous sulfate 325 (65 FE) MG EC tablet Take 1 tablet (325 mg total) by mouth 2 (two) times daily with a meal. (Patient taking differently: Take 325 mg by mouth daily. ) 60 tablet 3  . TURMERIC PO Take 1 capsule by mouth daily.     Facility-Administered Medications Prior to Visit  Medication Dose Route Frequency Provider Last Rate Last Admin  . methylPREDNISolone acetate (DEPO-MEDROL) injection 80 mg  80 mg Intramuscular Once Baity, Regina W, NP         Per  HPI unless specifically indicated in ROS section below Review of Systems Objective:  BP 130/80 (BP Location: Left Arm, Patient Position: Sitting, Cuff Size: Large)   Pulse 70   Temp 97.7 F (36.5 C) (Temporal)   Ht 5\' 6"  (1.676 m)   Wt 239 lb 5 oz (108.6 kg)   SpO2 99%   BMI 38.63 kg/m   Wt Readings from Last 3 Encounters:  04/18/20 239 lb 5 oz (108.6 kg)  03/21/20 240 lb (108.9 kg)  03/11/20 241 lb (109.3 kg)      Physical Exam Vitals and nursing note reviewed.  Constitutional:      General: She is not in acute distress.    Appearance: Normal appearance. She is well-developed. She is not ill-appearing.  HENT:     Head: Normocephalic and atraumatic.     Right Ear: Hearing, tympanic membrane, ear canal and external ear normal.     Left Ear: Hearing, tympanic membrane, ear canal and external ear normal.     Nose: Mucosal edema (and pallor) present. No congestion or rhinorrhea.     Right Sinus: No maxillary sinus tenderness or frontal sinus tenderness.     Left Sinus: No maxillary sinus tenderness or frontal sinus tenderness.     Mouth/Throat:     Mouth: Mucous membranes are moist.     Pharynx: Oropharynx is clear. Uvula midline. No oropharyngeal exudate or posterior oropharyngeal erythema.     Tonsils: No tonsillar abscesses.  Eyes:     General: No scleral icterus.    Conjunctiva/sclera: Conjunctivae normal.     Pupils: Pupils are equal, round, and reactive to light.  Cardiovascular:     Rate and Rhythm: Normal rate and regular rhythm.     Pulses: Normal pulses.     Heart sounds: Normal heart sounds. No murmur heard.   Pulmonary:     Effort: Pulmonary effort is normal. No respiratory distress.     Breath sounds: Normal breath sounds. No wheezing, rhonchi or rales.  Abdominal:     General: Bowel sounds are normal. There is no distension.     Palpations: Abdomen is soft. There is no mass.     Tenderness: There is abdominal tenderness (mild-mod, chronic) in the epigastric  area. There is no right CVA tenderness, left CVA tenderness, guarding or rebound. Negative signs include Murphy's sign.     Hernia: No hernia is present.  Musculoskeletal:     Cervical back: Normal range of motion and neck supple.  Lymphadenopathy:     Head:     Right side of head: No submental, submandibular, tonsillar, preauricular or posterior auricular adenopathy.     Left side of head: No submental, submandibular, tonsillar, preauricular or posterior auricular adenopathy.     Cervical: Cervical  adenopathy present.     Right cervical: No superficial, deep or posterior cervical adenopathy.    Left cervical: Superficial cervical adenopathy (tender) present. No deep or posterior cervical adenopathy.     Upper Body:     Right upper body: No supraclavicular or axillary adenopathy.     Left upper body: No supraclavicular or axillary adenopathy.  Skin:    General: Skin is warm and dry.     Findings: No rash.  Neurological:     Mental Status: She is alert.  Psychiatric:        Mood and Affect: Mood normal.        Behavior: Behavior normal.       Assessment & Plan:  This visit occurred during the SARS-CoV-2 public health emergency.  Safety protocols were in place, including screening questions prior to the visit, additional usage of staff PPE, and extensive cleaning of exam room while observing appropriate contact time as indicated for disinfecting solutions.   Problem List Items Addressed This Visit    Nausea    Has seen gen surgery for ongoing nausea since [facility] repair, despite holding PPI, iron. rec hold fish oil at this time. gen surgery thinks recent increase in dilt dose may be causing nausea - she has upcoming cards appt and will check with them. Consider recent MVI as culprit as well (she has h/o gastric sleeve). Check CMP, lipase today.       Relevant Orders   Comprehensive metabolic panel   Lipase   Cervical lymphadenitis - Primary    Of 2 d duration, isolated to cervical  region, with benign exam - anticipate transient benign cause.  rec supportive care with warm compresses, tylenol for discomfort.  Avoid NSAIDs and steroid in stomach history.  Update if ongoing past 3-4 wks for further eval (likely start with CBC).           No orders of the defined types were placed in this encounter.  Orders Placed This Encounter  Procedures  . Comprehensive metabolic panel  . Lipase    Patient Instructions  For isolated left neck swollen glands, I recommend giving this some time, watching for new symptoms developing. Tylenol for discomfort. Use warm compresses to tender area. Usually this improves over time, if ongoing past 3-4 weeks let us know for further evaluation  For nausea, back off fish oil. We will see what heart doctor thinks about dilt. Labs checked today.    Follow up plan: Return if symptoms worsen or fail to improve.  Ria Bush, MD

## 2020-04-18 NOTE — Assessment & Plan Note (Signed)
Inadequate control on current regimen. Contact Dr. A-E reguarding blood pressure meds and control.

## 2020-04-18 NOTE — Assessment & Plan Note (Signed)
Of 2 d duration, isolated to cervical region, with benign exam - anticipate transient benign cause.  rec supportive care with warm compresses, tylenol for discomfort.  Avoid NSAIDs and steroid in stomach history.  Update if ongoing past 3-4 wks for further eval (likely start with CBC).

## 2020-04-18 NOTE — Assessment & Plan Note (Signed)
Restart Xyzal at night.  Continue flonase 2 sprays per nostril daily.

## 2020-04-18 NOTE — Assessment & Plan Note (Signed)
Has seen gen surgery for ongoing nausea since [facility] repair, despite holding PPI, iron. rec hold fish oil at this time. gen surgery thinks recent increase in dilt dose may be causing nausea - she has upcoming cards appt and will check with them. Consider recent MVI as culprit as well (she has h/o gastric sleeve). Check CMP, lipase today.

## 2020-04-18 NOTE — Assessment & Plan Note (Signed)
Give esophagus time for swelling to repair.

## 2020-04-19 LAB — COMPREHENSIVE METABOLIC PANEL
ALT: 12 U/L (ref 0–35)
AST: 16 U/L (ref 0–37)
Albumin: 4.3 g/dL (ref 3.5–5.2)
Alkaline Phosphatase: 86 U/L (ref 39–117)
BUN: 16 mg/dL (ref 6–23)
CO2: 28 mEq/L (ref 19–32)
Calcium: 9.6 mg/dL (ref 8.4–10.5)
Chloride: 102 mEq/L (ref 96–112)
Creatinine, Ser: 0.52 mg/dL (ref 0.40–1.20)
GFR: 98.15 mL/min (ref >60.00)
Glucose, Bld: 80 mg/dL (ref 70–99)
Potassium: 4.1 mEq/L (ref 3.5–5.1)
Sodium: 138 mEq/L (ref 135–145)
Total Bilirubin: 0.3 mg/dL (ref 0.2–1.2)
Total Protein: 7.3 g/dL (ref 6.0–8.3)

## 2020-04-19 LAB — LIPASE: Lipase: 58 U/L (ref 11.0–59.0)

## 2020-04-20 ENCOUNTER — Ambulatory Visit: Payer: BC Managed Care – PPO | Admitting: Cardiology

## 2020-04-20 ENCOUNTER — Encounter: Payer: Self-pay | Admitting: Cardiology

## 2020-04-20 ENCOUNTER — Other Ambulatory Visit: Payer: Self-pay

## 2020-04-20 VITALS — BP 132/76 | HR 61 | Ht 66.0 in | Wt 239.0 lb

## 2020-04-20 DIAGNOSIS — I10 Essential (primary) hypertension: Secondary | ICD-10-CM | POA: Diagnosis not present

## 2020-04-20 DIAGNOSIS — I471 Supraventricular tachycardia: Secondary | ICD-10-CM

## 2020-04-20 MED ORDER — DILTIAZEM HCL ER 120 MG PO CP12: 120.0000 mg | ORAL_CAPSULE | Freq: Two times a day (BID) | ORAL | 3 refills | Status: DC

## 2020-04-20 NOTE — Progress Notes (Signed)
Electrophysiology Office Note:    Date:  04/20/2020   ID:  Kimberly Patton, DOB 09-28-1955, MRN [id number]  PCP:  Jinny Sanders, MD  [facility] Cardiologist:  [name], MD  [place] Electrophysiologist:  None   Referring MD: Jinny Sanders, MD   Chief Complaint: SVT  History of Present Illness:    Kimberly Patton is a 64 y.o. female who presents for an evaluation of SVT at the request of Dr. Garen Lah. Their medical history includes hypertension, hiatal hernia, GERD, iron deficiency anemia, SVT on diltiazem.  She was last seen by Dr. Garen Lah on March 21, 2020.  At that visit, she reported having palpitations dating back a decade for which she took Cardizem 300 mg.  After those remote episodes of palpitations, her arrhythmias remained quiesced sent until very recently.  For 2 to 3 days prior to her visit with Dr. Garen Lah, she reported palpitations and elevated blood pressures.  She called the physician on-call and was told to take an evening dose of Cardizem early.  Dr. Garen Lah started Cardizem 300 mg daily to help control her palpitations and blood pressure.  A 2-week heart monitor was ordered at that visit.  Today she tells me that a lot of her symptoms date back to March of this year.  This is around the time she received her coronavirus vaccine.  She says since that time she has had pancreatitis, eye inflammation multiple times.  She has several new drug allergies which have arisen this year including to lisinopril and losartan.  These seem to have led to a bradykinin mediated cough.  She has undergone an extensive abdominal surgery including repair of a hiatal hernia and "repositioning of the stomach".    She says with the Cardizem CD formulation she experiences nausea.  Is unclear whether or not her prior abdominal surgery or the Cardizem may be contributing to some of her GI upset and nausea.  Past Medical History:  Diagnosis Date  . ALLERGIC  RHINITIS 04/03/2007  . Anemia   . Anxiety   . Asthma    hx of years ago - no longer a problem   . ASTHMA, PERSISTENT, MODERATE 04/03/2007  . Cancer ([place])    skin   . Dyspnea on exertion    a. 11/2007 Echo: EF 60%.  . Dysrhythmia    hx of heart arrhythmia 10-15 years ago - followed by DR Lovena Le - not seen in years   . GERD (gastroesophageal reflux disease)   . 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 Hypertension   . Joint pain    HIPS / LEGS  . Knee injury    RT  . Meningioma ([place])   . Morbid obesity ([place])   . Paroxysmal SVT (supraventricular tachycardia) ([place])    a. 11/2011 48h Holter: RSR, rare PVC's, occas PAC's  . Retinal tear of right eye 01/2016  . Ventral hernia     Past Surgical History:  Procedure Laterality Date  . APPENDECTOMY  1996  . BREAST CYST EXCISION Right 1994   Benign  . CESAREAN SECTION     x2  . CHOLECYSTECTOMY  1995  . EYE SURGERY  01/2016   Repair retinal tear  . [place] / 1996   x2  . KNEE ARTHROSCOPY W/ MENISCAL REPAIR  07/25/13   right knee Dr. Mardelle Matte  . LAPAROSCOPIC GASTRIC SLEEVE RESECTION N/A 06/08/2013   Procedure: LAPAROSCOPIC GASTRIC SLEEVE RESECTION AND EXCISION OF SEBACEUS CYST FROM MID CHEST takedown of  incarcerated ventral hernia and primary repair endoscopy;  Surgeon: [name], MD;  Location: [facility];  Service: General;  Laterality: N/A;  . LAPAROSCOPIC NISSEN FUNDOPLICATION N/A 7/65/4650   Procedure: LAPAROSCOPIC REPAIR LARGE SYMPTOMATIC HIATAL HERNIA WITH UPPER ENDOSCOPY;  Surgeon: Johnathan Hausen, MD;  Location: [facility];  Service: General;  Laterality: N/A;  . moles  06/2013   removed 2 moles from under arm and  lowback  . NECK SURGERY     occipital nerve damage- injections   . OVARIAN CYST REMOVAL  1970  . PILONIDAL CYST EXCISION  1975  . RETINAL TEAR REPAIR CRYOTHERAPY Right 01/2016   [place]  . SKIN CANCER EXCISION    . TONSILLECTOMY    . UPPER GI ENDOSCOPY  06/08/2013   Procedure: UPPER GI ENDOSCOPY;  Surgeon: [name], MD;  Location: [facility];  Service: General;;  . US ECHOCARDIOGRAPHY  12/2011   WNL - EF 55-60%, mild MR, grade 1 diastolic dysnfiction (mild)  . VENTRAL HERNIA REPAIR  2000    Current Medications: Current Meds  Medication Sig  . acetaminophen (TYLENOL) 500 MG tablet Take 1,000 mg by mouth every 6 (six) hours as needed (for pain.).  Marland Kitchen albuterol (PROAIR HFA) 108 (90 Base) MCG/ACT inhaler TAKE 2 PUFFS BY MOUTH EVERY 6 HOURS AS NEEDED  . amoxicillin (AMOXIL) 500 MG capsule Take 2,000 mg by mouth 2 (two) times daily. 2 tabs am, 2 tabs pm  . Calcium Citrate-Vitamin D (CITRACAL + D PO) Take 2 tablets by mouth daily.   . [employer] OMEPRAZOLE 20 MG TBEC Take 40 mg by mouth daily.  . Cyanocobalamin (VITAMIN B-12) 5000 MCG SUBL Place 5,000 mcg under the tongue daily.  Marland Kitchen ELDERBERRY PO Take 1,000 mg by mouth daily.   . fluticasone (FLONASE) 50 MCG/ACT nasal spray SPRAY 2 SPRAYS INTO EACH NOSTRIL EVERY DAY  . Multiple Vitamins-Minerals (OPURITY) TABS Take 1 tablet by mouth daily.  . nitroGLYCERIN (NITROSTAT) 0.4 MG SL tablet Place 0.4 mg under the tongue every 5 (five) minutes as needed for chest pain.  Marland Kitchen ondansetron (ZOFRAN-ODT) 4 MG disintegrating tablet Take by mouth as needed.   . pantoprazole (PROTONIX) 40 MG tablet TAKE 1 TABLET BY MOUTH TWICE A DAY  . prednisoLONE acetate (PRED FORTE) 1 % ophthalmic suspension daily.  . traMADol (ULTRAM) 50 MG tablet Take 1 tablet (50 mg total) by mouth every 8 (eight) hours as needed.  . vitamin C (ASCORBIC ACID) 250 MG tablet Take 250 mg by mouth daily.  . [DISCONTINUED] diltiazem (CARDIZEM CD) 300 MG 24 hr capsule Take 1 capsule (300 mg total) by mouth daily.  . [DISCONTINUED] nitroGLYCERIN (NITROSTAT) 0.4 MG SL tablet Place 1 tablet (0.4 mg total) under the tongue every 5 (five) minutes as needed for chest pain.   Current Facility-Administered Medications for the 04/20/20 encounter (Office Visit) with Vickie Epley, MD  Medication  . methylPREDNISolone  acetate (DEPO-MEDROL) injection 80 mg     Allergies:   Wasp venom, Chlorhexidine gluconate, Metronidazole, Prednisone, and Sodium hypochlorite   Social History   Socioeconomic History  . Marital status: Divorced    Spouse name: Not on file  . Number of children: 2  . Years of education: 30  . Highest education level: Not on file  Occupational History  . Occupation: Product manager: [employer]  Tobacco Use  . Smoking status: Never Smoker  . Smokeless tobacco: Never Used  . Tobacco comment: smoke at age [age]  Vaping  Use  . Vaping Use: Never used  Substance and Sexual Activity  . Alcohol use: Not Currently    Alcohol/week: 0.0 standard drinks  . Drug use: No  . Sexual activity: Not on file  Other Topics Concern  . Not on file  Social History Narrative   Lives at home alone.   Right-handed.   Occasional caffeine use.   Social Determinants of Health   Financial Resource Strain:   . Difficulty of Paying Living Expenses: Not on file  Food Insecurity:   . Worried About Charity fundraiser in the Last Year: Not on file  . Ran Out of Food in the Last Year: Not on file  Transportation Needs:   . Lack of Transportation (Medical): Not on file  . Lack of Transportation (Non-Medical): Not on file  Physical Activity:   . Days of Exercise per Week: Not on file  . Minutes of Exercise per Session: Not on file  Stress:   . Feeling of Stress : Not on file  Social Connections:   . Frequency of Communication with Friends and Family: Not on file  . Frequency of Social Gatherings with Friends and Family: Not on file  . Attends Religious Services: Not on file  . Active Member of Clubs or Organizations: Not on file  . Attends Archivist Meetings: Not on file  . Marital Status: Not on file     Family History: The patient's family history includes Breast cancer in her mother; Diabetes in her father, mother, and another family member; Hemophilia in an other  family member; Hypertension in her father, mother, and another family member; Kidney failure in her mother; Stroke in an other family member.  ROS:   Please see the history of present illness.    All other systems reviewed and are negative.  EKGs/Labs/Other Studies Reviewed:    The following studies were reviewed today: ZIO, prior notes, echo  January 21, 2020 echo personally reviewed Left ventricular function normal, 60% Right ventricular function normal  March 21, 2020 ZIO personally reviewed Heart rate 43-1 93, average 71 26 episodes of SVT with the longest lasting 17 beats at a rate of 112 bpm.  SVT was detected within 45 seconds of a symptomatic patient event.  Episodes of SVT appear to have a P wave preceding QRS suggestive of an atrial tachycardia.     EKG:  The ekg ordered today demonstrates sinus rhythm with an incomplete right bundle branch block pattern in lead V1  Recent Labs: 11/23/2019: TSH 0.66 01/10/2020: Magnesium 2.1 03/31/2020: Hemoglobin 12.3; Platelets 324 04/18/2020: ALT 12; BUN 16; Creatinine, Ser 0.52; Potassium 4.1; Sodium 138  Recent Lipid Panel    Component Value Date/Time   CHOL 209 (H) 06/02/2019 0804   TRIG 111.0 06/02/2019 0804   HDL 63.10 06/02/2019 0804   CHOLHDL 3 06/02/2019 0804   VLDL 22.2 06/02/2019 0804   LDLCALC 124 (H) 06/02/2019 0804    Physical Exam:    VS:  BP 132/76   Pulse 61   Ht 5\' 6"  (1.676 m)   Wt 239 lb (108.4 kg)   SpO2 98%   BMI 38.58 kg/m     Wt Readings from Last 3 Encounters:  04/20/20 239 lb (108.4 kg)  04/18/20 239 lb 5 oz (108.6 kg)  03/21/20 240 lb (108.9 kg)     GEN:  Well nourished, well developed in no acute distress.  Obese HEENT: Normal NECK: No JVD; No carotid bruits LYMPHATICS: No lymphadenopathy CARDIAC:  RRR, no murmurs, rubs, gallops RESPIRATORY:  Clear to auscultation without rales, wheezing or rhonchi  ABDOMEN: Soft, non-tender, non-distended MUSCULOSKELETAL:  No edema; No deformity    SKIN: Warm and dry NEUROLOGIC:  Alert and oriented x 3 PSYCHIATRIC:  Normal affect   ASSESSMENT:    1. Paroxysmal SVT (supraventricular tachycardia) ([place])   2. Essential hypertension    PLAN:    In order of problems listed above:  1.  PSVT Patient with a very low burden of SVT on a recent ZIO.  Episodes appear to be possible A. tach given P waves preceding each QRS.  Given the low burden do not feel that an antiarrhythmic medication is warranted given the patient's documented history of multiple drug intolerances.  She has previously tolerated diltiazem but with a different formulation.  We will plan to transition from Cardizem CD to Cardizem SR today.  This formulation is a twice daily medication.  2.  Hypertension Controlled.  Continue current regimen with above transition to Cardizem SR.  Follow-up with general cardiology and primary care.  Follow-up 6 months.   Medication Adjustments/Labs and Tests Ordered: Current medicines are reviewed at length with the patient today.  Concerns regarding medicines are outlined above.  Orders Placed This Encounter  Procedures  . EKG 12-Lead   Meds ordered this encounter  Medications  . diltiazem (CARDIZEM SR) 120 MG 12 hr capsule    Sig: Take 1 capsule (120 mg total) by mouth 2 (two) times daily.    Dispense:  180 capsule    Refill:  3     Signed, Lars Mage, MD, [place]  04/20/2020 2:15 PM    Electrophysiology [facility]

## 2020-04-20 NOTE — Patient Instructions (Addendum)
Medication Instructions:  Your physician has recommended you make the following change in your medication:   1. STOP Cardizem CD  2.  START taking Cardizem SR 120 mg- Take one capsule by mouth twice a day.   Lab Work: None ordered. If you have labs (blood work) drawn today and your tests are completely normal, you will receive your results only by: Marland Kitchen MyChart Message (if you have MyChart) OR . A paper copy in the mail If you have any lab test that is abnormal or we need to change your treatment, we will call you to review the results.  Testing/Procedures: None ordered.  Follow-Up: At [facility], you and your health needs are our priority.  As part of our continuing mission to provide you with exceptional heart care, we have created designated Provider Care Teams.  These Care Teams include your primary Cardiologist (physician) and Advanced Practice Providers (APPs -  Physician Assistants and Nurse Practitioners) who all work together to provide you with the care you need, when you need it.  Your next appointment:   Your physician wants you to follow-up in: 6 months with Dr. Quentin Ore.   You will receive a reminder letter in the mail two months in advance. If you don't receive a letter, please call our office to schedule the follow-up appointment.

## 2020-04-25 ENCOUNTER — Ambulatory Visit: Payer: BC Managed Care – PPO | Admitting: Cardiology

## 2020-04-25 ENCOUNTER — Encounter: Payer: Self-pay | Admitting: Cardiology

## 2020-04-25 ENCOUNTER — Other Ambulatory Visit: Payer: Self-pay

## 2020-04-25 VITALS — BP 126/82 | HR 58 | Ht 66.0 in | Wt 240.1 lb

## 2020-04-25 DIAGNOSIS — I471 Supraventricular tachycardia: Secondary | ICD-10-CM | POA: Diagnosis not present

## 2020-04-25 DIAGNOSIS — R600 Localized edema: Secondary | ICD-10-CM

## 2020-04-25 DIAGNOSIS — I10 Essential (primary) hypertension: Secondary | ICD-10-CM

## 2020-04-25 DIAGNOSIS — E7800 Pure hypercholesterolemia, unspecified: Secondary | ICD-10-CM

## 2020-04-25 NOTE — Patient Instructions (Signed)
Medication Instructions:   Your physician recommends that you continue on your current medications as directed. Please refer to the Current Medication list given to you today.  *If you need a refill on your cardiac medications before your next appointment, please call your pharmacy*   Lab Work: None Ordered If you have labs (blood work) drawn today and your tests are completely normal, you will receive your results only by: Marland Kitchen MyChart Message (if you have MyChart) OR . A paper copy in the mail If you have any lab test that is abnormal or we need to change your treatment, we will call you to review the results.   Testing/Procedures: None Ordered   Follow-Up: At [facility], you and your health needs are our priority.  As part of our continuing mission to provide you with exceptional heart care, we have created designated Provider Care Teams.  These Care Teams include your primary Cardiologist (physician) and Advanced Practice Providers (APPs -  Physician Assistants and Nurse Practitioners) who all work together to provide you with the care you need, when you need it.  We recommend signing up for the patient portal called "MyChart".  Sign up information is provided on this After Visit Summary.  MyChart is used to connect with patients for Virtual Visits (Telemedicine).  Patients are able to view lab/test results, encounter notes, upcoming appointments, etc.  Non-urgent messages can be sent to your provider as well.   To learn more about what you can do with MyChart, go to [email or web address].    Your next appointment:   6 month(s)  The format for your next appointment:   In Person  Provider:   [name], MD   Other Instructions Referral to [facility]

## 2020-04-25 NOTE — Progress Notes (Signed)
Cardiology Office Note:    Date:  04/25/2020   ID:  Kimberly Patton, DOB Nov 06, 1955, MRN [id number]  PCP:  Jinny Sanders, MD  [facility] Cardiologist:  [name], MD  [place] Electrophysiologist:  None   Referring MD: Jinny Sanders, MD   Chief Complaint  Patient presents with  . OTHER    5 wk f/u zio c/o nausea, throat tightness and shoulder blade discomfort. Meds reviewed verbally with pt.    History of Present Illness:    Kimberly Patton is a 64 y.o. female with a hx of hypertension, hiatal hernia, GERD, iron deficiency anemia, SVT on diltiazem presenting for follow-up. Patient being seen due to hypertension and medication side effect.  Seen due to palpitations.  Cardiac monitor was placed to evaluate for SVT recurrence.  Monitor did reveal paroxysmal SVTs, low burden.  She was referred to and seen by electrophysiology, due to low burden, antiarrhythmics not recommended, Cardizem formulation was changed to SR.  Prior notes Echocardiogram 01/2020 showed normal systolic and diastolic function, EF 60 to 65%. Lexiscan Myoview 01/2020 with no evidence for ischemia. Has a history of hypokalemia while taking HCTZ. Losartan was started after stopping HCTZ. Developed nausea with losartan. This was stopped.   Past Medical History:  Diagnosis Date  . ALLERGIC RHINITIS 04/03/2007  . Anemia   . Anxiety   . Asthma    hx of years ago - no longer a problem   . ASTHMA, PERSISTENT, MODERATE 04/03/2007  . Cancer ([place])    skin   . Dyspnea on exertion    a. 11/2007 Echo: EF 60%.  . Dysrhythmia    hx of heart arrhythmia 10-15 years ago - followed by DR Lovena Le - not seen in years   . GERD (gastroesophageal reflux disease)   . 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 Hypertension   . Joint pain    HIPS / LEGS  . Knee injury    RT  . Meningioma ([place])   . Morbid obesity ([place])   . Paroxysmal SVT (supraventricular tachycardia) ([place])    a. 11/2011 48h Holter: RSR, rare PVC's, occas PAC's  .  Retinal tear of right eye 01/2016  . Ventral hernia     Past Surgical History:  Procedure Laterality Date  . APPENDECTOMY  1996  . BREAST CYST EXCISION Right 1994   Benign  . CESAREAN SECTION     x2  . CHOLECYSTECTOMY  1995  . EYE SURGERY  01/2016   Repair retinal tear  . [place] / 1996   x2  . KNEE ARTHROSCOPY W/ MENISCAL REPAIR  07/25/13   right knee Dr. Mardelle Matte  . LAPAROSCOPIC GASTRIC SLEEVE RESECTION N/A 06/08/2013   Procedure: LAPAROSCOPIC GASTRIC SLEEVE RESECTION AND EXCISION OF SEBACEUS CYST FROM MID CHEST takedown of incarcerated ventral hernia and primary repair endoscopy;  Surgeon: [name], MD;  Location: [facility];  Service: General;  Laterality: N/A;  . LAPAROSCOPIC NISSEN FUNDOPLICATION N/A 0/17/5102   Procedure: LAPAROSCOPIC REPAIR LARGE SYMPTOMATIC HIATAL HERNIA WITH UPPER ENDOSCOPY;  Surgeon: Johnathan Hausen, MD;  Location: [facility];  Service: General;  Laterality: N/A;  . moles  06/2013   removed 2 moles from under arm and  lowback  . NECK SURGERY     occipital nerve damage- injections   . OVARIAN CYST REMOVAL  1970  . PILONIDAL CYST EXCISION  1975  . RETINAL TEAR REPAIR CRYOTHERAPY Right 01/2016   [place]  . SKIN CANCER EXCISION    .  TONSILLECTOMY    . UPPER GI ENDOSCOPY  06/08/2013   Procedure: UPPER GI ENDOSCOPY;  Surgeon: [name], MD;  Location: [facility];  Service: General;;  . US ECHOCARDIOGRAPHY  12/2011   WNL - EF 55-60%, mild MR, grade 1 diastolic dysnfiction (mild)  . VENTRAL HERNIA REPAIR  2000    Current Medications: Current Meds  Medication Sig  . acetaminophen (TYLENOL) 500 MG tablet Take 1,000 mg by mouth every 6 (six) hours as needed (for pain.).  Marland Kitchen albuterol (PROAIR HFA) 108 (90 Base) MCG/ACT inhaler TAKE 2 PUFFS BY MOUTH EVERY 6 HOURS AS NEEDED  . amoxicillin (AMOXIL) 500 MG capsule Take 2,000 mg by mouth 2 (two) times daily. 2 tabs am, 2 tabs pm  . Calcium Citrate-Vitamin D (CITRACAL + D PO) Take 2 tablets by mouth  daily.   . [employer] OMEPRAZOLE 20 MG TBEC Take 40 mg by mouth daily.  . Cyanocobalamin (VITAMIN B-12) 5000 MCG SUBL Place 5,000 mcg under the tongue daily.  Marland Kitchen diltiazem (CARDIZEM SR) 120 MG 12 hr capsule Take 1 capsule (120 mg total) by mouth 2 (two) times daily.  Marland Kitchen ELDERBERRY PO Take 1,000 mg by mouth daily.   . fluticasone (FLONASE) 50 MCG/ACT nasal spray SPRAY 2 SPRAYS INTO EACH NOSTRIL EVERY DAY  . Multiple Vitamins-Minerals (OPURITY) TABS Take 1 tablet by mouth daily.  . nitroGLYCERIN (NITROSTAT) 0.4 MG SL tablet Place 0.4 mg under the tongue every 5 (five) minutes as needed for chest pain.  Marland Kitchen ondansetron (ZOFRAN-ODT) 4 MG disintegrating tablet Take by mouth as needed.   . pantoprazole (PROTONIX) 40 MG tablet TAKE 1 TABLET BY MOUTH TWICE A DAY  . prednisoLONE acetate (PRED FORTE) 1 % ophthalmic suspension daily.  . traMADol (ULTRAM) 50 MG tablet Take 1 tablet (50 mg total) by mouth every 8 (eight) hours as needed.  . vitamin C (ASCORBIC ACID) 250 MG tablet Take 250 mg by mouth daily.   Current Facility-Administered Medications for the 04/25/20 encounter (Office Visit) with [name], MD  Medication  . methylPREDNISolone acetate (DEPO-MEDROL) injection 80 mg     Allergies:   Wasp venom, Chlorhexidine gluconate, Metronidazole, Prednisone, and Sodium hypochlorite   Social History   Socioeconomic History  . Marital status: Divorced    Spouse name: Not on file  . Number of children: 2  . Years of education: 15  . Highest education level: Not on file  Occupational History  . Occupation: Product manager: [employer]  Tobacco Use  . Smoking status: Never Smoker  . Smokeless tobacco: Never Used  . Tobacco comment: smoke at age [age]  Vaping Use  . Vaping Use: Never used  Substance and Sexual Activity  . Alcohol use: Not Currently    Alcohol/week: 0.0 standard drinks  . Drug use: No  . Sexual activity: Not on file  Other Topics Concern  . Not on file   Social History Narrative   Lives at home alone.   Right-handed.   Occasional caffeine use.   Social Determinants of Health   Financial Resource Strain:   . Difficulty of Paying Living Expenses: Not on file  Food Insecurity:   . Worried About Charity fundraiser in the Last Year: Not on file  . Ran Out of Food in the Last Year: Not on file  Transportation Needs:   . Lack of Transportation (Medical): Not on file  . Lack of Transportation (Non-Medical): Not on file  Physical Activity:   .  Days of Exercise per Week: Not on file  . Minutes of Exercise per Session: Not on file  Stress:   . Feeling of Stress : Not on file  Social Connections:   . Frequency of Communication with Friends and Family: Not on file  . Frequency of Social Gatherings with Friends and Family: Not on file  . Attends Religious Services: Not on file  . Active Member of Clubs or Organizations: Not on file  . Attends Archivist Meetings: Not on file  . Marital Status: Not on file     Family History: The patient's family history includes Breast cancer in her mother; Diabetes in her father, mother, and another family member; Hemophilia in an other family member; Hypertension in her father, mother, and another family member; Kidney failure in her mother; Stroke in an other family member.  ROS:   Please see the history of present illness.     All other systems reviewed and are negative.  EKGs/Labs/Other Studies Reviewed:    The following studies were reviewed today:   EKG:  EKG is  ordered today.  The ekg ordered today demonstrates sinus bradycardia  Recent Labs: 11/23/2019: TSH 0.66 01/10/2020: Magnesium 2.1 03/31/2020: Hemoglobin 12.3; Platelets 324 04/18/2020: ALT 12; BUN 16; Creatinine, Ser 0.52; Potassium 4.1; Sodium 138  Recent Lipid Panel    Component Value Date/Time   CHOL 209 (H) 06/02/2019 0804   TRIG 111.0 06/02/2019 0804   HDL 63.10 06/02/2019 0804   CHOLHDL 3 06/02/2019 0804   VLDL  22.2 06/02/2019 0804   LDLCALC 124 (H) 06/02/2019 0804    Physical Exam:    VS:  BP 126/82 (BP Location: Left Arm, Patient Position: Sitting, Cuff Size: Large)   Pulse (!) 58   Ht 5\' 6"  (1.676 m)   Wt 240 lb 2 oz (108.9 kg)   SpO2 98%   BMI 38.76 kg/m     Wt Readings from Last 3 Encounters:  04/25/20 240 lb 2 oz (108.9 kg)  04/20/20 239 lb (108.4 kg)  04/18/20 239 lb 5 oz (108.6 kg)     GEN:  Well nourished, well developed in no acute distress, obese HEENT: Normal NECK: No JVD; No carotid bruits LYMPHATICS: No lymphadenopathy CARDIAC: RRR, no murmurs, rubs, gallops RESPIRATORY:  Clear to auscultation without rales, wheezing or rhonchi  ABDOMEN: Soft, non-tender, non-distended MUSCULOSKELETAL: Lower extremity lymphedema noted SKIN: Warm and dry NEUROLOGIC:  Alert and oriented x 3 PSYCHIATRIC:  Normal affect   ASSESSMENT:    1. Essential hypertension   2. Paroxysmal SVT (supraventricular tachycardia) (HCC)   3. Pure hypercholesterolemia   4. Leg edema    PLAN:    In order of problems listed above:   1. Patient with history of hypertension.  BP controlled.  Cardizem SR 120 mg twice daily. 2. History of paroxysmal SVT, recent palpitations noted.  EP input appreciated.  Continue Cardizem SR 120 mg twice daily. 3. History of hyperlipidemia, not in statin benefit group.  Low-cholesterol diet advised 4. Lower extremity varicose veins, lymphedema in the lower extremity noted.  Will refer patient to vein clinic for additional input and management.  Follow-up in 6 months.  Total encounter time more than 40 minutes  Greater than 50% was spent in counseling and coordination of care with the patient   This note was generated in part or whole with voice recognition software. Voice recognition is usually quite accurate but there are transcription errors that can and very often do occur. I apologize  for any typographical errors that were not detected and corrected.  Medication  Adjustments/Labs and Tests Ordered: Current medicines are reviewed at length with the patient today.  Concerns regarding medicines are outlined above.  Orders Placed This Encounter  Procedures  . Ambulatory referral to Vascular Surgery  . EKG 12-Lead   No orders of the defined types were placed in this encounter.   Patient Instructions  Medication Instructions:   Your physician recommends that you continue on your current medications as directed. Please refer to the Current Medication list given to you today.  *If you need a refill on your cardiac medications before your next appointment, please call your pharmacy*   Lab Work: None Ordered If you have labs (blood work) drawn today and your tests are completely normal, you will receive your results only by: Marland Kitchen MyChart Message (if you have MyChart) OR . A paper copy in the mail If you have any lab test that is abnormal or we need to change your treatment, we will call you to review the results.   Testing/Procedures: None Ordered   Follow-Up: At [facility], you and your health needs are our priority.  As part of our continuing mission to provide you with exceptional heart care, we have created designated Provider Care Teams.  These Care Teams include your primary Cardiologist (physician) and Advanced Practice Providers (APPs -  Physician Assistants and Nurse Practitioners) who all work together to provide you with the care you need, when you need it.  We recommend signing up for the patient portal called "MyChart".  Sign up information is provided on this After Visit Summary.  MyChart is used to connect with patients for Virtual Visits (Telemedicine).  Patients are able to view lab/test results, encounter notes, upcoming appointments, etc.  Non-urgent messages can be sent to your provider as well.   To learn more about what you can do with MyChart, go to [email or web address].    Your next appointment:   6 month(s)  The  format for your next appointment:   In Person  Provider:   [name], MD   Other Instructions Referral to [place], [name], MD  04/25/2020 12:33 PM    [place]

## 2020-05-10 ENCOUNTER — Ambulatory Visit (INDEPENDENT_AMBULATORY_CARE_PROVIDER_SITE_OTHER): Payer: BC Managed Care – PPO | Admitting: Family Medicine

## 2020-05-10 ENCOUNTER — Other Ambulatory Visit: Payer: Self-pay

## 2020-05-10 VITALS — BP 124/78 | HR 71 | Temp 98.3°F | Ht 66.0 in | Wt 241.1 lb

## 2020-05-10 DIAGNOSIS — R110 Nausea: Secondary | ICD-10-CM | POA: Diagnosis not present

## 2020-05-10 DIAGNOSIS — H9311 Tinnitus, right ear: Secondary | ICD-10-CM | POA: Diagnosis not present

## 2020-05-10 DIAGNOSIS — R002 Palpitations: Secondary | ICD-10-CM | POA: Diagnosis not present

## 2020-05-10 DIAGNOSIS — H9201 Otalgia, right ear: Secondary | ICD-10-CM | POA: Diagnosis not present

## 2020-05-10 DIAGNOSIS — R519 Headache, unspecified: Secondary | ICD-10-CM

## 2020-05-10 NOTE — Patient Instructions (Addendum)
I will contact ENT and eye MD to make sure no clear connection with issues or need for re-eval of brain with imaging and let you know via MyChart.

## 2020-05-10 NOTE — Progress Notes (Signed)
Chief Complaint  Patient presents with  . Nausea    RT inflammed, RT ear decreased hearing, high pitch noise, heart racing    History of Present Illness: HPI   64 year old female presents for  Follow up continued nausea  She had noted some nausea since hiatal hernia repair... some better with holding PPI and fish oil. Some better with decrease of diltiazem dose. She is now only nauseous in morning... ginger helps.   CMP and lipase normal on 04/18/2020 BP Readings from Last 3 Encounters:  05/10/20 124/78  04/25/20 126/82  04/20/20 132/76   Recent cardiology OV with Dr.  For palpitations Cardiac monitor showed SVT paroxsysmal Echocardiogram 01/2020 showed normal systolic and diastolic function, EF 60 to 65%. Lexiscan Myoview 01/2020 with no evidence for ischemia.  Told to Continue Cardizem SR 120 mg twice daily Referred to vascular for lymphedema and varicose veins.  Also with right ear  Pain.. saw ENT ( Dr. Durene Fruits)... no fluid seen,   thought maybe TMJ Noted decreased hearing on right for 1 year... verified on hearing exam. Now tinnitus in right ear... this started after ENT removed a string of fiber from ear per pt.  Cervical lymph node on  Left noted 04/18/20.. felt likely benign cause. Feels only slightly now..  Head pressure  in forehead x 2 weeks as well  Seeing  Opthalmology for floaters in eye.. told inflammation in right eye.  This visit occurred during the SARS-CoV-2 public health emergency.  Safety protocols were in place, including screening questions prior to the visit, additional usage of staff PPE, and extensive cleaning of exam room while observing appropriate contact time as indicated for disinfecting solutions.   COVID 19 screen:  No recent travel or known exposure to COVID19 The patient denies respiratory symptoms of COVID 19 at this time. The importance of social distancing was discussed today.     Review of Systems  Constitutional: Negative for chills and  fever.  HENT: Positive for ear pain and hearing loss. Negative for congestion.   Eyes: Negative for pain and redness.  Respiratory: Negative for cough and shortness of breath.   Cardiovascular: Negative for chest pain, palpitations and leg swelling.  Gastrointestinal: Negative for abdominal pain, blood in stool, constipation, diarrhea, nausea and vomiting.  Genitourinary: Negative for dysuria.  Musculoskeletal: Negative for falls and myalgias.  Skin: Negative for rash.  Neurological: Negative for dizziness.  Psychiatric/Behavioral: Negative for depression. The patient is not nervous/anxious.       Past Medical History:  Diagnosis Date  . ALLERGIC RHINITIS 04/03/2007  . Anemia   . Anxiety   . Asthma    hx of years ago - no longer a problem   . ASTHMA, PERSISTENT, MODERATE 04/03/2007  . Cancer ([place])    skin   . Dyspnea on exertion    a. 11/2007 Echo: EF 60%.  . Dysrhythmia    hx of heart arrhythmia 10-15 years ago - followed by DR Lovena Le - not seen in years   . GERD (gastroesophageal reflux disease)   . 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 Hypertension   . Joint pain    HIPS / LEGS  . Knee injury    RT  . Meningioma ([place])   . Morbid obesity ([place])   . Paroxysmal SVT (supraventricular tachycardia) ([place])    a. 11/2011 48h Holter: RSR, rare PVC's, occas PAC's  . Retinal tear of right eye 01/2016  . Ventral hernia     reports that she has  never smoked. She has never used smokeless tobacco. She reports previous alcohol use. She reports that she does not use drugs.   Current Outpatient Medications:  .  acetaminophen (TYLENOL) 500 MG tablet, Take 1,000 mg by mouth every 6 (six) hours as needed (for pain.)., Disp: , Rfl:  .  albuterol (PROAIR HFA) 108 (90 Base) MCG/ACT inhaler, TAKE 2 PUFFS BY MOUTH EVERY 6 HOURS AS NEEDED, Disp: 8.5 Inhaler, Rfl: 11 .  Calcium Citrate-Vitamin D (CITRACAL + D PO), Take 2 tablets by mouth daily. , Disp: , Rfl:  .  Cyanocobalamin (VITAMIN B-12) 5000 MCG SUBL, Place  5,000 mcg under the tongue daily., Disp: , Rfl:  .  diltiazem (CARDIZEM SR) 120 MG 12 hr capsule, Take 1 capsule (120 mg total) by mouth 2 (two) times daily., Disp: 180 capsule, Rfl: 3 .  ELDERBERRY PO, Take 1,000 mg by mouth daily. , Disp: , Rfl:  .  fluticasone (FLONASE) 50 MCG/ACT nasal spray, SPRAY 2 SPRAYS INTO EACH NOSTRIL EVERY DAY, Disp: 48 mL, Rfl: 3 .  Lactobacillus Rhamnosus, GG, (CULTURELLE IMMUNITY SUPPORT PO), Take by mouth., Disp: , Rfl:  .  Levocetirizine Dihydrochloride (XYZAL PO), Take by mouth., Disp: , Rfl:  .  Multiple Vitamins-Minerals (CENTRUM SILVER 50+WOMEN PO), Take by mouth., Disp: , Rfl:  .  nitroGLYCERIN (NITROSTAT) 0.4 MG SL tablet, Place 0.4 mg under the tongue every 5 (five) minutes as needed for chest pain., Disp: , Rfl:  .  ondansetron (ZOFRAN-ODT) 4 MG disintegrating tablet, Take by mouth as needed. , Disp: , Rfl:  .  pantoprazole (PROTONIX) 40 MG tablet, TAKE 1 TABLET BY MOUTH TWICE A DAY, Disp: 180 tablet, Rfl: 1 .  traMADol (ULTRAM) 50 MG tablet, Take 1 tablet (50 mg total) by mouth every 8 (eight) hours as needed., Disp: 10 tablet, Rfl: 0 .  TURMERIC PO, Take by mouth., Disp: , Rfl:  .  vitamin C (ASCORBIC ACID) 250 MG tablet, Take 250 mg by mouth daily., Disp: , Rfl:   Current Facility-Administered Medications:  .  methylPREDNISolone acetate (DEPO-MEDROL) injection 80 mg, 80 mg, Intramuscular, Once, Baity, Regina W, NP   Observations/Objective: Blood pressure 124/78, pulse 71, temperature 98.3 F (36.8 C), height 5\' 6"  (1.676 m), weight 241 lb 1.9 oz (109.4 kg), SpO2 97 %.  Physical Exam Constitutional:      General: She is not in acute distress.Vital signs are normal.     Appearance: Normal appearance. She is well-developed and well-nourished. She is not ill-appearing or toxic-appearing.  HENT:     Head: Normocephalic.     Right Ear: Hearing, tympanic membrane, ear canal and external ear normal. Tympanic membrane is not erythematous, retracted or  bulging.     Left Ear: Hearing, tympanic membrane, ear canal and external ear normal. Tympanic membrane is not erythematous, retracted or bulging.     Nose: No mucosal edema or rhinorrhea.     Right Sinus: No maxillary sinus tenderness or frontal sinus tenderness.     Left Sinus: No maxillary sinus tenderness or frontal sinus tenderness.     Mouth/Throat:     Mouth: Oropharynx is clear and moist and mucous membranes are normal.     Pharynx: Uvula midline.  Eyes:     General: Lids are normal. Lids are everted, no foreign bodies appreciated.     Extraocular Movements: EOM normal.     Conjunctiva/sclera: Conjunctivae normal.     Pupils: Pupils are equal, round, and reactive to light.  Neck:  Thyroid: No thyroid mass or thyromegaly.     Vascular: No carotid bruit.     Trachea: Trachea normal.  Cardiovascular:     Rate and Rhythm: Normal rate and regular rhythm.     Pulses: Normal pulses and intact distal pulses.     Heart sounds: Normal heart sounds, S1 normal and S2 normal. No murmur heard. No friction rub. No gallop.   Pulmonary:     Effort: Pulmonary effort is normal. No tachypnea or respiratory distress.     Breath sounds: Normal breath sounds. No decreased breath sounds, wheezing, rhonchi or rales.  Abdominal:     General: Bowel sounds are normal.     Palpations: Abdomen is soft.     Tenderness: There is no abdominal tenderness.  Musculoskeletal:     Cervical back: Normal range of motion and neck supple.  Skin:    General: Skin is warm, dry and intact.     Findings: No rash.     Comments:  Scalp ttp over  Left head  Neurological:     Mental Status: She is alert.  Psychiatric:        Mood and Affect: Mood is not anxious or depressed.        Speech: Speech normal.        Behavior: Behavior normal. Behavior is cooperative.        Thought Content: Thought content normal.        Cognition and Memory: Cognition and memory normal.        Judgment: Judgment normal.       Assessment and Plan Multiple new issues  New head pressure and right sided tinnitus in setting of continued nausea, and right ear pain.  Normal neuro exam in office No clear red flags for brain mass  Reviewed recent ENT OV note as well as cardiology visit for palpitations. I will contact eye MD and ENT to determine if these issues could be connected. Will  likely consider repeat MRI brain given history of meningioma and no recent evaluation.     [name], MD

## 2020-05-12 DIAGNOSIS — D329 Benign neoplasm of meninges, unspecified: Secondary | ICD-10-CM

## 2020-05-23 ENCOUNTER — Encounter (INDEPENDENT_AMBULATORY_CARE_PROVIDER_SITE_OTHER): Payer: Self-pay | Admitting: Vascular Surgery

## 2020-05-23 ENCOUNTER — Other Ambulatory Visit: Payer: Self-pay

## 2020-05-23 ENCOUNTER — Ambulatory Visit (INDEPENDENT_AMBULATORY_CARE_PROVIDER_SITE_OTHER): Payer: BC Managed Care – PPO | Admitting: Vascular Surgery

## 2020-05-23 VITALS — BP 121/79 | HR 71 | Resp 16 | Ht 66.0 in | Wt 245.0 lb

## 2020-05-23 DIAGNOSIS — I25118 Atherosclerotic heart disease of native coronary artery with other forms of angina pectoris: Secondary | ICD-10-CM

## 2020-05-23 DIAGNOSIS — I10 Essential (primary) hypertension: Secondary | ICD-10-CM | POA: Diagnosis not present

## 2020-05-23 DIAGNOSIS — I872 Venous insufficiency (chronic) (peripheral): Secondary | ICD-10-CM | POA: Diagnosis not present

## 2020-05-23 DIAGNOSIS — I890 Lymphedema, not elsewhere classified: Secondary | ICD-10-CM

## 2020-05-23 DIAGNOSIS — E7800 Pure hypercholesterolemia, unspecified: Secondary | ICD-10-CM

## 2020-05-25 ENCOUNTER — Other Ambulatory Visit: Payer: Self-pay | Admitting: Family Medicine

## 2020-05-29 ENCOUNTER — Encounter (INDEPENDENT_AMBULATORY_CARE_PROVIDER_SITE_OTHER): Payer: Self-pay | Admitting: Vascular Surgery

## 2020-05-29 DIAGNOSIS — I2510 Atherosclerotic heart disease of native coronary artery without angina pectoris: Secondary | ICD-10-CM | POA: Insufficient documentation

## 2020-05-29 DIAGNOSIS — I890 Lymphedema, not elsewhere classified: Secondary | ICD-10-CM | POA: Insufficient documentation

## 2020-05-29 DIAGNOSIS — I872 Venous insufficiency (chronic) (peripheral): Secondary | ICD-10-CM | POA: Insufficient documentation

## 2020-05-29 NOTE — Progress Notes (Signed)
MRN : [id number]  Kimberly Patton is a 64 y.o. ([birth year]) female who presents with chief complaint of  Chief Complaint  Patient presents with  . New Patient (Initial Visit)    Ref Agbor-Etang le edema  .  History of Present Illness:   Patient is seen for evaluation of leg pain and leg swelling. The patient first noticed the swelling remotely. The swelling is associated with pain and discoloration. The pain and swelling worsens with prolonged dependency and improves with elevation. The pain is unrelated to activity.  The patient notes that in the morning the legs are significantly improved but they steadily worsened throughout the course of the day. The patient also notes a steady worsening of the discoloration in the ankle and shin area.   The patient denies claudication symptoms.  The patient denies symptoms consistent with rest pain.  The patient denies and extensive history of DJD and LS spine disease.  The patient has no had any past angiography, interventions or vascular surgery.  Elevation makes the leg symptoms better, dependency makes them much worse. There is no history of ulcerations. The patient denies any recent changes in medications.  The patient has been wearing graduated compression on a daily basis for years and has not been able to maintain her good edema control over the past several months.  She has been exercising and participates in Cardiac rehab.  The patient denies a history of DVT or PE. There is no prior history of phlebitis. There is no history of primary lymphedema.  No history of malignancies. No history of trauma or groin or pelvic surgery. There is no history of radiation treatment to the groin or pelvis  The patient denies amaurosis fugax or recent TIA symptoms. There are no recent neurological changes noted. The patient denies recent episodes of angina or shortness of breath  Current Meds  Medication Sig  . acetaminophen (TYLENOL) 500 MG tablet  Take 1,000 mg by mouth every 6 (six) hours as needed (for pain.).  Marland Kitchen albuterol (PROAIR HFA) 108 (90 Base) MCG/ACT inhaler TAKE 2 PUFFS BY MOUTH EVERY 6 HOURS AS NEEDED  . Calcium Citrate-Vitamin D (CITRACAL + D PO) Take 2 tablets by mouth daily.   . Cyanocobalamin (VITAMIN B-12) 5000 MCG SUBL Place 5,000 mcg under the tongue daily.  Marland Kitchen diltiazem (CARDIZEM SR) 120 MG 12 hr capsule Take 1 capsule (120 mg total) by mouth 2 (two) times daily.  Marland Kitchen ELDERBERRY PO Take 1,000 mg by mouth daily.   . fluticasone (FLONASE) 50 MCG/ACT nasal spray SPRAY 2 SPRAYS INTO EACH NOSTRIL EVERY DAY  . Ginkgo Biloba 40 MG TABS Take 120 mg by mouth 2 (two) times daily.  . Lactobacillus Rhamnosus, GG, (CULTURELLE IMMUNITY SUPPORT PO) Take by mouth.  . Multiple Vitamins-Minerals (CENTRUM SILVER 50+WOMEN PO) Take by mouth.  . nitroGLYCERIN (NITROSTAT) 0.4 MG SL tablet Place 0.4 mg under the tongue every 5 (five) minutes as needed for chest pain.  Marland Kitchen ondansetron (ZOFRAN-ODT) 4 MG disintegrating tablet Take by mouth as needed.   . traMADol (ULTRAM) 50 MG tablet Take 1 tablet (50 mg total) by mouth every 8 (eight) hours as needed.  . TURMERIC PO Take by mouth.  . vitamin C (ASCORBIC ACID) 250 MG tablet Take 250 mg by mouth daily.   Current Facility-Administered Medications for the 05/23/20 encounter (Office Visit) with Delana Meyer, Dolores Lory, MD  Medication  . methylPREDNISolone acetate (DEPO-MEDROL) injection 80 mg    Past Medical History:  Diagnosis Date  .  ALLERGIC RHINITIS 04/03/2007  . Anemia   . Anxiety   . Asthma    hx of years ago - no longer a problem   . ASTHMA, PERSISTENT, MODERATE 04/03/2007  . Cancer ([place])    skin   . Dyspnea on exertion    a. 11/2007 Echo: EF 60%.  . Dysrhythmia    hx of heart arrhythmia 10-15 years ago - followed by DR Lovena Le - not seen in years   . GERD (gastroesophageal reflux disease)   . 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 Hypertension   . Joint pain    HIPS / LEGS  . Knee injury    RT  .  Meningioma ([place])   . Morbid obesity ([place])   . Paroxysmal SVT (supraventricular tachycardia) ([place])    a. 11/2011 48h Holter: RSR, rare PVC's, occas PAC's  . Retinal tear of right eye 01/2016  . Ventral hernia     Past Surgical History:  Procedure Laterality Date  . APPENDECTOMY  1996  . BREAST CYST EXCISION Right 1994   Benign  . CESAREAN SECTION     x2  . CHOLECYSTECTOMY  1995  . EYE SURGERY  01/2016   Repair retinal tear  . [place] / 1996   x2  . KNEE ARTHROSCOPY W/ MENISCAL REPAIR  07/25/13   right knee Dr. Mardelle Matte  . LAPAROSCOPIC GASTRIC SLEEVE RESECTION N/A 06/08/2013   Procedure: LAPAROSCOPIC GASTRIC SLEEVE RESECTION AND EXCISION OF SEBACEUS CYST FROM MID CHEST takedown of incarcerated ventral hernia and primary repair endoscopy;  Surgeon: [name], MD;  Location: [facility];  Service: General;  Laterality: N/A;  . LAPAROSCOPIC NISSEN FUNDOPLICATION N/A [id number]/40/8144   Procedure: LAPAROSCOPIC REPAIR LARGE SYMPTOMATIC HIATAL HERNIA WITH UPPER ENDOSCOPY;  Surgeon: Johnathan Hausen, MD;  Location: [facility];  Service: General;  Laterality: N/A;  . moles  06/2013   removed 2 moles from under arm and  lowback  . NECK SURGERY     occipital nerve damage- injections   . OVARIAN CYST REMOVAL  1970  . PILONIDAL CYST EXCISION  1975  . RETINAL TEAR REPAIR CRYOTHERAPY Right 01/2016   [place]  . SKIN CANCER EXCISION    . TONSILLECTOMY    . UPPER GI ENDOSCOPY  06/08/2013   Procedure: UPPER GI ENDOSCOPY;  Surgeon: [name], MD;  Location: [facility];  Service: General;;  . US ECHOCARDIOGRAPHY  12/2011   WNL - EF 55-60%, mild MR, grade 1 diastolic dysnfiction (mild)  . VENTRAL HERNIA REPAIR  2000    Social History Social History   Tobacco Use  . Smoking status: Never Smoker  . Smokeless tobacco: Never Used  . Tobacco comment: smoke at age 44-12  Vaping Use  . Vaping Use: Never used  Substance Use Topics  . Alcohol use: Not Currently    Alcohol/week: 0.0 standard drinks   . Drug use: No    Family History Family History  Problem Relation Age of Onset  . Breast cancer Mother   . Diabetes Mother   . Hypertension Mother   . Kidney failure Mother   . Diabetes Father   . Hypertension Father   . Diabetes Other   . Hypertension Other   . Stroke Other   . Hemophilia Other   No family history of bleeding/clotting disorders, porphyria or autoimmune disease   Allergies  Allergen Reactions  . Wasp Venom Anaphylaxis  . Chlorhexidine Gluconate     Itching under breasts  . Metronidazole Other (See Comments)  Chest tightness, neck tightness  . Prednisone Other (See Comments)    Severe migraine / with taper dose  . Sodium Hypochlorite Rash    Liquid clorox bleach     REVIEW OF SYSTEMS (Negative unless checked)  Constitutional: [] Weight loss  [] Fever  [] Chills Cardiac: [] Chest pain   [] Chest pressure   [] Palpitations   [] Shortness of breath when laying flat   [x] Shortness of breath with exertion. Vascular:  [] Pain in legs with walking   [x] Pain in legs at rest  [] History of DVT   [] Phlebitis   [x] Swelling in legs   [] Varicose veins   [] Non-healing ulcers Pulmonary:   [] Uses home oxygen   [] Productive cough   [] Hemoptysis   [] Wheeze  [] COPD   [] Asthma Neurologic:  [] Dizziness   [] Seizures   [] History of stroke   [] History of TIA  [] Aphasia   [] Vissual changes   [] Weakness or numbness in arm   [] Weakness or numbness in leg Musculoskeletal:   [] Joint swelling   [x] Joint pain   [] Low back pain Hematologic:  [] Easy bruising  [] Easy bleeding   [] Hypercoagulable state   [] Anemic Gastrointestinal:  [] Diarrhea   [] Vomiting  [] Gastroesophageal reflux/heartburn   [] Difficulty swallowing. Genitourinary:  [] Chronic kidney disease   [] Difficult urination  [] Frequent urination   [] Blood in urine Skin:  [] Rashes   [] Ulcers  Psychological:  [] History of anxiety   []  History of major depression.  Physical Examination  Vitals:   05/23/20 1115  BP: 121/79  Pulse: 71   Resp: 16  Weight: 245 lb (111.1 kg)  Height: 5\' 6"  (1.676 m)   Body mass index is 39.54 kg/m. Gen: WD/WN, NAD Head: [place]/AT, No temporalis wasting.  Ear/Nose/Throat: Hearing grossly intact, nares w/o erythema or drainage, poor dentition Eyes: PER, EOMI, sclera nonicteric.  Neck: Supple, no masses.  No bruit or JVD.  Pulmonary:  Good air movement, clear to auscultation bilaterally, no use of accessory muscles.  Cardiac: RRR, normal S1, S2, no Murmurs. Vascular: scattered varicosities present bilaterally.  Mild venous stasis changes to the legs bilaterally.  3+ soft pitting edema Vessel Right Left  Radial Palpable Palpable  PT Palpable Palpable  DP Palpable Palpable  Gastrointestinal: soft, non-distended. No guarding/no peritoneal signs.  Musculoskeletal: M/S 5/5 throughout.  No deformity or atrophy.  Neurologic: CN 2-12 intact. Pain and light touch intact in extremities.  Symmetrical.  Speech is fluent. Motor exam as listed above. Psychiatric: Judgment intact, Mood & affect appropriate for pt's clinical situation. Dermatologic: Venous rashes no ulcers noted.  No changes consistent with cellulitis.   CBC Lab Results  Component Value Date   WBC 5.6 03/31/2020   HGB 12.3 03/31/2020   HCT 38.2 03/31/2020   MCV 86.8 03/31/2020   PLT 324 03/31/2020    BMET    Component Value Date/Time   NA 138 04/18/2020 1540   NA 139 01/21/2020 1137   K 4.1 04/18/2020 1540   CL 102 04/18/2020 1540   CO2 28 04/18/2020 1540   GLUCOSE 80 04/18/2020 1540   BUN 16 04/18/2020 1540   BUN 16 01/21/2020 1137   CREATININE 0.52 04/18/2020 1540   CREATININE 0.57 01/01/2020 1623   CALCIUM 9.6 04/18/2020 1540   GFRNONAA >60 02/02/2020 1417   GFRAA >60 02/02/2020 1417   CrCl cannot be calculated (Patient's most recent lab result is older than the maximum 21 days allowed.).  COAG Lab Results  Component Value Date   INR 1.0 12/03/2007   INR 1.0 11/25/2007    Radiology No  results  found.   Assessment/Plan 1. Chronic venous insufficiency Recommend:  No surgery or intervention at this point in time.    I have reviewed my previous discussion with the patient regarding swelling and why it causes symptoms.  Patient will continue wearing graduated compression stockings class 1 (20-30 mmHg) on a daily basis. The patient will  beginning wearing the stockings first thing in the morning and removing them in the evening. The patient is instructed specifically not to sleep in the stockings.    In addition, behavioral modification including several periods of elevation of the lower extremities during the day will be continued.  This was reviewed with the patient during the initial visit.  The patient will also continue routine exercise, especially walking on a daily basis as was discussed during the initial visit.    Despite conservative treatments including graduated compression therapy class 1 and behavioral modification including exercise and elevation the patient  has not obtained adequate control of the lymphedema.  The patient still has stage 3 lymphedema and therefore, I believe that a lymph pump should be added to improve the control of the patient's lymphedema.  Additionally, a lymph pump is warranted because it will reduce the risk of cellulitis and ulceration in the future.  Patient should follow-up in six months    2. Lymphedema Recommend:  No surgery or intervention at this point in time.    I have reviewed my previous discussion with the patient regarding swelling and why it causes symptoms.  Patient will continue wearing graduated compression stockings class 1 (20-30 mmHg) on a daily basis. The patient will  beginning wearing the stockings first thing in the morning and removing them in the evening. The patient is instructed specifically not to sleep in the stockings.    In addition, behavioral modification including several periods of elevation of the lower  extremities during the day will be continued.  This was reviewed with the patient during the initial visit.  The patient will also continue routine exercise, especially walking on a daily basis as was discussed during the initial visit.    Despite conservative treatments including graduated compression therapy class 1 and behavioral modification including exercise and elevation the patient  has not obtained adequate control of the lymphedema.  The patient still has stage 3 lymphedema and therefore, I believe that a lymph pump should be added to improve the control of the patient's lymphedema.  Additionally, a lymph pump is warranted because it will reduce the risk of cellulitis and ulceration in the future.  Patient should follow-up in six months    3. Coronary artery disease of native artery of native heart with stable angina pectoris (HCC) Continue cardiac and antihypertensive medications as already ordered and reviewed, no changes at this time.  Continue statin as ordered and reviewed, no changes at this time  Nitrates PRN for chest pain   4. Primary hypertension Continue antihypertensive medications as already ordered, these medications have been reviewed and there are no changes at this time.   5. Hypercholesteremia Continue statin as ordered and reviewed, no changes at this time     Hortencia Pilar, MD  05/29/2020 9:38 AM

## 2020-05-30 ENCOUNTER — Other Ambulatory Visit (INDEPENDENT_AMBULATORY_CARE_PROVIDER_SITE_OTHER): Payer: Self-pay | Admitting: Vascular Surgery

## 2020-05-30 DIAGNOSIS — I872 Venous insufficiency (chronic) (peripheral): Secondary | ICD-10-CM

## 2020-05-31 ENCOUNTER — Other Ambulatory Visit: Payer: Self-pay

## 2020-05-31 ENCOUNTER — Encounter (INDEPENDENT_AMBULATORY_CARE_PROVIDER_SITE_OTHER): Payer: Self-pay | Admitting: Nurse Practitioner

## 2020-05-31 ENCOUNTER — Ambulatory Visit (INDEPENDENT_AMBULATORY_CARE_PROVIDER_SITE_OTHER): Payer: BC Managed Care – PPO

## 2020-05-31 ENCOUNTER — Telehealth: Payer: Self-pay | Admitting: Family Medicine

## 2020-05-31 ENCOUNTER — Ambulatory Visit (INDEPENDENT_AMBULATORY_CARE_PROVIDER_SITE_OTHER): Payer: BC Managed Care – PPO | Admitting: Nurse Practitioner

## 2020-05-31 VITALS — BP 136/84 | HR 76 | Resp 16 | Wt 244.8 lb

## 2020-05-31 DIAGNOSIS — H9313 Tinnitus, bilateral: Secondary | ICD-10-CM

## 2020-05-31 DIAGNOSIS — I83893 Varicose veins of bilateral lower extremities with other complications: Secondary | ICD-10-CM

## 2020-05-31 DIAGNOSIS — I872 Venous insufficiency (chronic) (peripheral): Secondary | ICD-10-CM | POA: Diagnosis not present

## 2020-05-31 DIAGNOSIS — I890 Lymphedema, not elsewhere classified: Secondary | ICD-10-CM | POA: Diagnosis not present

## 2020-05-31 DIAGNOSIS — R110 Nausea: Secondary | ICD-10-CM

## 2020-05-31 DIAGNOSIS — E7800 Pure hypercholesterolemia, unspecified: Secondary | ICD-10-CM | POA: Diagnosis not present

## 2020-05-31 DIAGNOSIS — D329 Benign neoplasm of meninges, unspecified: Secondary | ICD-10-CM

## 2020-05-31 DIAGNOSIS — I10 Essential (primary) hypertension: Secondary | ICD-10-CM

## 2020-05-31 DIAGNOSIS — R519 Headache, unspecified: Secondary | ICD-10-CM

## 2020-05-31 NOTE — Telephone Encounter (Signed)
Pt is currently having ringing in ears worsening and was advised to ask for an added IAC, if not added, for MRI tomorrow. Please advise.

## 2020-05-31 NOTE — Telephone Encounter (Signed)
This patient has MRI scheduled for 12/22... she has tinnitus and would like Internal auditory canal visualized on the MRI... can we make sure this is added?

## 2020-06-01 ENCOUNTER — Ambulatory Visit: Payer: BC Managed Care – PPO

## 2020-06-01 NOTE — Addendum Note (Signed)
Addended by: [name] E on: 06/01/2020 11:59 AM   Modules accepted: Orders

## 2020-06-01 NOTE — Telephone Encounter (Signed)
Spoke with Dr Diona Browner. Order pending for her to sign.

## 2020-06-01 NOTE — Telephone Encounter (Signed)
Need to have a new order placed for MRI Brain with IAC and then I need to call insurance to see if I can get this approved. In Epic for this order it does not have option of just without contrast. Please review. I spoke with patient letting her know the status of this and that her MRI appointment may need to be moved to another day if I can not get a new order and get it authorized on time. Her appointment is at 11 am.  Sending to Dr Lorelei Pont also in case he can help with ordering the test. Thank you.

## 2020-06-01 NOTE — Telephone Encounter (Signed)
Spoke with patient and advised that MRI for today will need to be cancelled due to waiting on the correct order and needing to re do authorization. Patient was frustrated. I apologized and advised that I would work on this as fast as I can once I have the order. Patient needs this done before January 1st. Possibly need order to say STAT if appropriate.

## 2020-06-01 NOTE — Telephone Encounter (Signed)
Order placed.  I also sent a Mychart message to patient asking her to be patient and to make sure she understands what constraints we have.

## 2020-06-01 NOTE — Telephone Encounter (Signed)
MRI order changed. Authorized. Patient is scheduled for 06/02/20 at 10 am at [facility]. Patient is aware of all the information.

## 2020-06-01 NOTE — Addendum Note (Signed)
Addended by: Kris Mouton on: 06/01/2020 11:56 AM   Modules accepted: Orders

## 2020-06-02 ENCOUNTER — Other Ambulatory Visit: Payer: Self-pay

## 2020-06-02 ENCOUNTER — Ambulatory Visit
Admission: RE | Admit: 2020-06-02 | Discharge: 2020-06-02 | Disposition: A | Discharge status: Home / Self Care | Payer: BC Managed Care – PPO | Source: Ambulatory Visit | Attending: Family Medicine | Admitting: Family Medicine

## 2020-06-02 DIAGNOSIS — R519 Headache, unspecified: Secondary | ICD-10-CM

## 2020-06-02 DIAGNOSIS — D329 Benign neoplasm of meninges, unspecified: Secondary | ICD-10-CM | POA: Diagnosis not present

## 2020-06-02 DIAGNOSIS — R110 Nausea: Secondary | ICD-10-CM

## 2020-06-02 DIAGNOSIS — H9313 Tinnitus, bilateral: Secondary | ICD-10-CM | POA: Diagnosis present

## 2020-06-02 MED ORDER — GADOBUTROL 1 MMOL/ML IV SOLN
10.0000 mL | Freq: Once | INTRAVENOUS | Status: AC | PRN
  Administered 2020-06-02: 10 mL via INTRAVENOUS

## 2020-06-05 NOTE — Progress Notes (Signed)
Subjective:    Patient ID: Kimberly Patton, female    DOB: 1956-04-23, 64 y.o.   MRN: [id number] Chief Complaint  Patient presents with  . Follow-up    Ultrasound follow up    The patient returns to the office for followup evaluation regarding leg swelling.  The swelling has persisted and the pain associated with swelling continues. There have not been any interval development of a ulcerations or wounds.  Since the previous visit the patient has been wearing graduated compression stockings and has noted little if any improvement in the lymphedema. The patient has been using compression routinely morning until night.  The patient also states elevation during the day and exercise is being done too.   Today noninvasive studies show no evidence of DVT or superficial venous thrombosis seen bilaterally.  There is no evidence of deep venous insufficiency seen bilaterally.  There is evidence of reflux in the right lower extremity in the great saphenous vein at the distal thigh extending to the proximal calf.  The left lower extremity has evidence of reflux in the great saphenous vein at the saphenofemoral junction extending to the proximal calf.   Review of Systems  Cardiovascular: Positive for leg swelling.  All other systems reviewed and are negative.      Objective:   Physical Exam Vitals reviewed.  HENT:     Head: Normocephalic.  Cardiovascular:     Rate and Rhythm: Normal rate.     Pulses: Normal pulses.  Pulmonary:     Effort: Pulmonary effort is normal.  Musculoskeletal:     Right lower leg: Edema present.     Left lower leg: Edema present.  Skin:    Comments: Dermal thickening bilaterally   Neurological:     Mental Status: She is alert and oriented to person, place, and time.  Psychiatric:        Mood and Affect: Mood normal.        Behavior: Behavior normal.        Thought Content: Thought content normal.        Judgment: Judgment normal.     BP 136/84 (BP  Location: Right Arm)   Pulse 76   Resp 16   Wt 244 lb 12.8 oz (111 kg)   BMI 39.51 kg/m   Past Medical History:  Diagnosis Date  . ALLERGIC RHINITIS 04/03/2007  . Anemia   . Anxiety   . Asthma    hx of years ago - no longer a problem   . ASTHMA, PERSISTENT, MODERATE 04/03/2007  . Cancer ([place])    skin   . Dyspnea on exertion    a. 11/2007 Echo: EF 60%.  . Dysrhythmia    hx of heart arrhythmia 10-15 years ago - followed by DR Lovena Le - not seen in years   . GERD (gastroesophageal reflux disease)   . 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 Hypertension   . Joint pain    HIPS / LEGS  . Knee injury    RT  . Meningioma ([place])   . Morbid obesity ([place])   . Paroxysmal SVT (supraventricular tachycardia) ([place])    a. 11/2011 48h Holter: RSR, rare PVC's, occas PAC's  . Retinal tear of right eye 01/2016  . Ventral hernia     Social History   Socioeconomic History  . Marital status: Divorced    Spouse name: Not on file  . Number of children: 2  . Years of education: 43  . Highest education level: Not  on file  Occupational History  . Occupation: Product manager: [employer]  Tobacco Use  . Smoking status: Never Smoker  . Smokeless tobacco: Never Used  . Tobacco comment: smoke at age 27-12  Vaping Use  . Vaping Use: Never used  Substance and Sexual Activity  . Alcohol use: Not Currently    Alcohol/week: 0.0 standard drinks  . Drug use: No  . Sexual activity: Not on file  Other Topics Concern  . Not on file  Social History Narrative   Lives at home alone.   Right-handed.   Occasional caffeine use.   Social Determinants of Health   Financial Resource Strain: Not on file  Food Insecurity: Not on file  Transportation Needs: Not on file  Physical Activity: Not on file  Stress: Not on file  Social Connections: Not on file  Intimate Partner Violence: Not on file    Past Surgical History:  Procedure Laterality Date  . APPENDECTOMY  1996  . BREAST CYST EXCISION Right  1994   Benign  . CESAREAN SECTION     x2  . CHOLECYSTECTOMY  1995  . EYE SURGERY  01/2016   Repair retinal tear  . [place] / 1996   x2  . KNEE ARTHROSCOPY W/ MENISCAL REPAIR  07/25/13   right knee Dr. Mardelle Matte  . LAPAROSCOPIC GASTRIC SLEEVE RESECTION N/A 06/08/2013   Procedure: LAPAROSCOPIC GASTRIC SLEEVE RESECTION AND EXCISION OF SEBACEUS CYST FROM MID CHEST takedown of incarcerated ventral hernia and primary repair endoscopy;  Surgeon: [name], MD;  Location: [facility];  Service: General;  Laterality: N/A;  . LAPAROSCOPIC NISSEN FUNDOPLICATION N/A 11/29/3084   Procedure: LAPAROSCOPIC REPAIR LARGE SYMPTOMATIC HIATAL HERNIA WITH UPPER ENDOSCOPY;  Surgeon: Johnathan Hausen, MD;  Location: [facility];  Service: General;  Laterality: N/A;  . moles  06/2013   removed 2 moles from under arm and  lowback  . NECK SURGERY     occipital nerve damage- injections   . OVARIAN CYST REMOVAL  1970  . PILONIDAL CYST EXCISION  1975  . RETINAL TEAR REPAIR CRYOTHERAPY Right 01/2016   [place]  . SKIN CANCER EXCISION    . TONSILLECTOMY    . UPPER GI ENDOSCOPY  06/08/2013   Procedure: UPPER GI ENDOSCOPY;  Surgeon: [name], MD;  Location: [facility];  Service: General;;  . US ECHOCARDIOGRAPHY  12/2011   WNL - EF 55-60%, mild MR, grade 1 diastolic dysnfiction (mild)  . VENTRAL HERNIA REPAIR  2000    Family History  Problem Relation Age of Onset  . Breast cancer Mother   . Diabetes Mother   . Hypertension Mother   . Kidney failure Mother   . Diabetes Father   . Hypertension Father   . Diabetes Other   . Hypertension Other   . Stroke Other   . Hemophilia Other     Allergies  Allergen Reactions  . Wasp Venom Anaphylaxis  . Chlorhexidine Gluconate     Itching under breasts  . Metronidazole Other (See Comments)    Chest tightness, neck tightness  . Prednisone Other (See Comments)    Severe migraine / with taper dose  . Sodium Hypochlorite Rash    Liquid clorox bleach    CBC  Latest Ref Rng & Units 03/31/2020 02/24/2020 02/04/2020  WBC 4.0 - 10.5 K/uL 5.6 7.3 6.0  Hemoglobin 12.0 - 15.0 g/dL 12.3 12.3 10.8(L)  Hematocrit 36.0 - 46.0 % 38.2 37.0 34.9(L)  Platelets 150 -  400 K/uL 324 287.0 213      CMP     Component Value Date/Time   NA 138 04/18/2020 1540   NA 139 01/21/2020 1137   K 4.1 04/18/2020 1540   CL 102 04/18/2020 1540   CO2 28 04/18/2020 1540   GLUCOSE 80 04/18/2020 1540   BUN 16 04/18/2020 1540   BUN 16 01/21/2020 1137   CREATININE 0.52 04/18/2020 1540   CREATININE 0.57 01/01/2020 1623   CALCIUM 9.6 04/18/2020 1540   PROT 7.3 04/18/2020 1540   ALBUMIN 4.3 04/18/2020 1540   AST 16 04/18/2020 1540   ALT 12 04/18/2020 1540   ALKPHOS 86 04/18/2020 1540   BILITOT 0.3 04/18/2020 1540   GFRNONAA >60 02/02/2020 1417   GFRAA >60 02/02/2020 1417     No results found.     Assessment & Plan:   1. Lymphedema Recommend:  No surgery or intervention at this point in time.    I have reviewed my previous discussion with the patient regarding swelling and why it causes symptoms.  Patient will continue wearing graduated compression stockings class 1 (20-30 mmHg) on a daily basis. The patient will  beginning wearing the stockings first thing in the morning and removing them in the evening. The patient is instructed specifically not to sleep in the stockings.    In addition, behavioral modification including several periods of elevation of the lower extremities during the day will be continued.  This was reviewed with the patient during the initial visit.  The patient will also continue routine exercise, especially walking on a daily basis as was discussed during the initial visit.    Despite conservative treatments for at least four weeks including graduated compression therapy class 1 and behavioral modification including exercise and elevation the patient  has not obtained adequate control of the lymphedema.  The patient still has stage 3 lymphedema  and therefore, I believe that a lymph pump should be added to improve the control of the patient's lymphedema.  Additionally, a lymph pump is warranted because it will reduce the risk of cellulitis and ulceration in the future.  Patient should follow-up in six months    2. Hypercholesteremia Continue statin as ordered and reviewed, no changes at this time   3. Primary hypertension Continue antihypertensive medications as already ordered, these medications have been reviewed and there are no changes at this time.   4. Varicose veins of bilateral lower extremities with other complications Will continue with conservative therapy outlined abouve   Current Outpatient Medications on File Prior to Visit  Medication Sig Dispense Refill  . acetaminophen (TYLENOL) 500 MG tablet Take 1,000 mg by mouth every 6 (six) hours as needed (for pain.).    Marland Kitchen albuterol (PROAIR HFA) 108 (90 Base) MCG/ACT inhaler TAKE 2 PUFFS BY MOUTH EVERY 6 HOURS AS NEEDED 8.5 Inhaler 11  . Calcium Citrate-Vitamin D (CITRACAL + D PO) Take 2 tablets by mouth daily.     . Cyanocobalamin (VITAMIN B-12) 5000 MCG SUBL Place 5,000 mcg under the tongue daily.    Marland Kitchen diltiazem (CARDIZEM SR) 120 MG 12 hr capsule Take 1 capsule (120 mg total) by mouth 2 (two) times daily. 180 capsule 3  . ELDERBERRY PO Take 1,000 mg by mouth daily.     . fluticasone (FLONASE) 50 MCG/ACT nasal spray SPRAY 2 SPRAYS INTO EACH NOSTRIL EVERY DAY 48 mL 3  . Ginkgo Biloba 40 MG TABS Take 120 mg by mouth 2 (two) times daily.    Marland Kitchen  Lactobacillus Rhamnosus, GG, (CULTURELLE IMMUNITY SUPPORT PO) Take by mouth.    . Multiple Vitamins-Minerals (CENTRUM SILVER 50+WOMEN PO) Take by mouth.    . nitroGLYCERIN (NITROSTAT) 0.4 MG SL tablet Place 0.4 mg under the tongue every 5 (five) minutes as needed for chest pain.    Marland Kitchen ondansetron (ZOFRAN-ODT) 4 MG disintegrating tablet Take by mouth as needed.     . traMADol (ULTRAM) 50 MG tablet Take 1 tablet (50 mg total) by mouth  every 8 (eight) hours as needed. 10 tablet 0  . TURMERIC PO Take by mouth.    . vitamin C (ASCORBIC ACID) 250 MG tablet Take 250 mg by mouth daily.    . Levocetirizine Dihydrochloride (XYZAL PO) Take by mouth.    . pantoprazole (PROTONIX) 40 MG tablet TAKE 1 TABLET BY MOUTH TWICE A DAY 180 tablet 1   Current Facility-Administered Medications on File Prior to Visit  Medication Dose Route Frequency Provider Last Rate Last Admin  . methylPREDNISolone acetate (DEPO-MEDROL) injection 80 mg  80 mg Intramuscular Once Jearld Fenton, NP        There are no Patient Instructions on file for this visit. No follow-ups on file.   Kris Hartmann, NP

## 2020-06-06 ENCOUNTER — Telehealth (INDEPENDENT_AMBULATORY_CARE_PROVIDER_SITE_OTHER): Payer: BC Managed Care – PPO | Admitting: Family Medicine

## 2020-06-06 ENCOUNTER — Other Ambulatory Visit: Payer: Self-pay

## 2020-06-06 ENCOUNTER — Encounter: Payer: Self-pay | Admitting: Family Medicine

## 2020-06-06 ENCOUNTER — Encounter (INDEPENDENT_AMBULATORY_CARE_PROVIDER_SITE_OTHER): Payer: Self-pay | Admitting: Nurse Practitioner

## 2020-06-06 VITALS — HR 81 | Temp 98.0°F | Ht 66.0 in

## 2020-06-06 DIAGNOSIS — J4520 Mild intermittent asthma, uncomplicated: Secondary | ICD-10-CM

## 2020-06-06 DIAGNOSIS — J209 Acute bronchitis, unspecified: Secondary | ICD-10-CM

## 2020-06-06 MED ORDER — GUAIFENESIN-CODEINE 100-10 MG/5ML PO SYRP
5.0000 mL | ORAL_SOLUTION | Freq: Three times a day (TID) | ORAL | 0 refills | Status: DC | PRN
Start: 2020-06-06 — End: 2020-10-10

## 2020-06-06 NOTE — Assessment & Plan Note (Addendum)
Anticipate viral given short duration. Offered COVID swab - pt declines.  Reviewed viral process and rec fluids, rest, supportive treatment. Rx cheratussin syrup for cough. Discussed albuterol use in asthma hx and wheeze last night. Rec pepcid for worsening indigestion with all this coughing.  Discussed OTC corcedin use in HTN hx in place of decongestant containing alternatives.  Red flags suggestive of bacterial infection reviewed, advised to update [place] if these develop to consider abx course.

## 2020-06-06 NOTE — Assessment & Plan Note (Signed)
Minimal symptoms.  Discussed albuterol use.

## 2020-06-06 NOTE — Progress Notes (Signed)
Patient ID: Kimberly Patton, female    DOB: 03-06-1956, 64 y.o.   MRN: [id number]  Virtual visit completed through MyChart, a video enabled telemedicine application. Due to national recommendations of social distancing due to COVID-19, a virtual visit is felt to be most appropriate for this patient at this time. Reviewed limitations, risks, security and privacy concerns of performing a virtual visit and the availability of in person appointments. I also reviewed that there may be a patient responsible charge related to this service. The patient agreed to proceed.   Interactive audio and video telecommunications were attempted between myself and LORELEY SCHWALL, however failed due to patient having technical difficulties OR patient not having access to video capability.  We continued and completed visit with audio only.  Time: 12:58pm - 1:15pm   Patient location: home  Provider location: [place] at [facility], office  Persons participating in this virtual visit: patient, provider   If any vitals were documented, they were collected by patient at home unless specified below.    Pulse 81   Temp 98 F (36.7 C)   Ht 5\' 6"  (1.676 m)   SpO2 95%   BMI 39.51 kg/m   BP Readings from Last 3 Encounters:  05/31/20 136/84  05/23/20 121/79  05/10/20 124/78    CC: cough, congestion Subjective:   HPI: Kimberly Patton is a 64 y.o. female presenting on 06/06/2020 for Cough (C/o cough, chest congestion, sore throat and runny nose.  Sxs started 06/04/20.  Tried DayQuil and Sudafed.)   3d h/o sinus congestion progressing to cough with fits, sore throat, now with chest congestion, rhinorrhea. Dry deep cough present. Feels chills but no fever. PNdrainage, last night felt wheezing. Currently chest > head congestion.   No tooth pain, abd pain, body aches. No loss of taste or smell. No dyspnea.  Chronic nausea, recent worsening indigestion.  Chronic earache has seen ENT thought TMJ.  Treating at  home with dayquil and sudafed.  Currently off pantoprazole due to nausea. Worsening indigestion due to all the coughing.   H/o remote asthma, no recent trouble. Has albuterol inhaler to use PRN - not used in years.  No known COVID exposure.  She has received COVID vaccine x2 earlier in the year.  H/o HTN on cardizem SR 120mg  BID.   Recent dx tinnitus, recent MRI for vertigo - no abnormality but showing 2cm L frontoparietal meningioma.     Relevant past medical, surgical, family and social history reviewed and updated as indicated. Interim medical history since our last visit reviewed. Allergies and medications reviewed and updated. Outpatient Medications Prior to Visit  Medication Sig Dispense Refill  . acetaminophen (TYLENOL) 500 MG tablet Take 1,000 mg by mouth every 6 (six) hours as needed (for pain.).    06/06/20 albuterol (PROAIR HFA) 108 (90 Base) MCG/ACT inhaler TAKE 2 PUFFS BY MOUTH EVERY 6 HOURS AS NEEDED 8.5 Inhaler 11  . Calcium Citrate-Vitamin D (CITRACAL + D PO) Take 2 tablets by mouth daily.     . Cyanocobalamin (VITAMIN B-12) 5000 MCG SUBL Place 5,000 mcg under the tongue daily.    diltiazem (CARDIZEM SR) 120 MG 12 hr capsule Take 1 capsule (120 mg total) by mouth 2 (two) times daily. 180 capsule 3  . ELDERBERRY PO Take 1,000 mg by mouth daily.     . fluticasone (FLONASE) 50 MCG/ACT nasal spray SPRAY 2 SPRAYS INTO EACH NOSTRIL EVERY DAY 48 mL 3  . Ginkgo Biloba 40 MG  TABS Take 120 mg by mouth 2 (two) times daily.    . Lactobacillus Rhamnosus, GG, (CULTURELLE IMMUNITY SUPPORT PO) Take by mouth.    . Multiple Vitamins-Minerals (CENTRUM SILVER 50+WOMEN PO) Take by mouth.    . nitroGLYCERIN (NITROSTAT) 0.4 MG SL tablet Place 0.4 mg under the tongue every 5 (five) minutes as needed for chest pain.    Marland Kitchen ondansetron (ZOFRAN-ODT) 4 MG disintegrating tablet Take by mouth as needed.     . traMADol (ULTRAM) 50 MG tablet Take 1 tablet (50 mg total) by mouth every 8 (eight) hours as needed.  10 tablet 0  . TURMERIC PO Take by mouth.    . vitamin C (ASCORBIC ACID) 250 MG tablet Take 250 mg by mouth daily.    . Levocetirizine Dihydrochloride (XYZAL PO) Take by mouth.    . pantoprazole (PROTONIX) 40 MG tablet TAKE 1 TABLET BY MOUTH TWICE A DAY 180 tablet 1   Facility-Administered Medications Prior to Visit  Medication Dose Route Frequency Provider Last Rate Last Admin  . methylPREDNISolone acetate (DEPO-MEDROL) injection 80 mg  80 mg Intramuscular Once Jearld Fenton, NP         Per HPI unless specifically indicated in ROS section below Review of Systems Objective:  Pulse 81   Temp 98 F (36.7 C)   Ht 5\' 6"  (1.676 m)   SpO2 95%   BMI 39.51 kg/m   Wt Readings from Last 3 Encounters:  05/31/20 244 lb 12.8 oz (111 kg)  05/23/20 245 lb (111.1 kg)  05/10/20 241 lb 1.9 oz (109.4 kg)       Pulm: speaks in complete sentences without increased work of breathing     Assessment & Plan:   Problem List Items Addressed This Visit    Asthma, mild intermittent    Minimal symptoms.  Discussed albuterol use.       Acute bronchitis - Primary    Anticipate viral given short duration. Offered COVID swab - pt declines.  Reviewed viral process and rec fluids, rest, supportive treatment. Rx cheratussin syrup for cough. Discussed albuterol use in asthma hx and wheeze last night. Rec pepcid for worsening indigestion with all this coughing.  Discussed OTC corcedin use in HTN hx in place of decongestant containing alternatives.  Red flags suggestive of bacterial infection reviewed, advised to update [place] if these develop to consider abx course.           No orders of the defined types were placed in this encounter.  No orders of the defined types were placed in this encounter.   I discussed the assessment and treatment plan with the patient. The patient was provided an opportunity to ask questions and all were answered. The patient agreed with the plan and demonstrated an  understanding of the instructions. The patient was advised to call back or seek an in-person evaluation if the symptoms worsen or if the condition fails to improve as anticipated.  Follow up plan: Return if symptoms worsen or fail to improve.  Ria Bush, MD

## 2020-06-07 NOTE — Telephone Encounter (Signed)
Called to follow up with patient was seen by Dr. Reece Agar yesterday virtually. No further issues at this time.

## 2020-06-28 DIAGNOSIS — M5412 Radiculopathy, cervical region: Secondary | ICD-10-CM | POA: Insufficient documentation

## 2020-07-21 DIAGNOSIS — H9313 Tinnitus, bilateral: Secondary | ICD-10-CM | POA: Insufficient documentation

## 2020-08-17 NOTE — Telephone Encounter (Signed)
Please advise when and what labs patient's are needed drawn and I can get her scheduled.

## 2020-08-18 ENCOUNTER — Telehealth: Payer: Self-pay | Admitting: Family Medicine

## 2020-08-18 DIAGNOSIS — Z130 Encounter for screening for diseases of the blood and blood-forming organs and certain disorders involving the immune mechanism: Secondary | ICD-10-CM

## 2020-08-18 DIAGNOSIS — E7800 Pure hypercholesterolemia, unspecified: Secondary | ICD-10-CM

## 2020-08-18 NOTE — Telephone Encounter (Signed)
-----   Message from Cloyd Stagers, RT sent at 08/18/2020  9:45 AM EST ----- Regarding: Lab Orders for Tuesday 3.15.2022 Please place lab orders for Tuesday 3.15.2022, appt notes state "cbc, CMET A1C  and lipids" Thank you, Dyke Maes RT(R)

## 2020-08-22 ENCOUNTER — Other Ambulatory Visit: Payer: Self-pay | Admitting: Family Medicine

## 2020-08-22 DIAGNOSIS — R110 Nausea: Secondary | ICD-10-CM

## 2020-08-23 ENCOUNTER — Other Ambulatory Visit (INDEPENDENT_AMBULATORY_CARE_PROVIDER_SITE_OTHER): Payer: BC Managed Care – PPO

## 2020-08-23 ENCOUNTER — Other Ambulatory Visit: Payer: Self-pay

## 2020-08-23 DIAGNOSIS — E7800 Pure hypercholesterolemia, unspecified: Secondary | ICD-10-CM

## 2020-08-23 DIAGNOSIS — Z130 Encounter for screening for diseases of the blood and blood-forming organs and certain disorders involving the immune mechanism: Secondary | ICD-10-CM

## 2020-08-23 DIAGNOSIS — R110 Nausea: Secondary | ICD-10-CM

## 2020-08-23 DIAGNOSIS — D509 Iron deficiency anemia, unspecified: Secondary | ICD-10-CM | POA: Diagnosis not present

## 2020-08-23 LAB — COMPREHENSIVE METABOLIC PANEL
ALT: 11 U/L (ref 0–35)
AST: 11 U/L (ref 0–37)
Albumin: 4 g/dL (ref 3.5–5.2)
Alkaline Phosphatase: 94 U/L (ref 39–117)
BUN: 17 mg/dL (ref 6–23)
CO2: 31 mEq/L (ref 19–32)
Calcium: 9.6 mg/dL (ref 8.4–10.5)
Chloride: 102 mEq/L (ref 96–112)
Creatinine, Ser: 0.5 mg/dL (ref 0.40–1.20)
GFR: 98.84 mL/min (ref >60.00)
Glucose, Bld: 81 mg/dL (ref 70–99)
Potassium: 4.1 mEq/L (ref 3.5–5.1)
Sodium: 140 mEq/L (ref 135–145)
Total Bilirubin: 0.4 mg/dL (ref 0.2–1.2)
Total Protein: 6.8 g/dL (ref 6.0–8.3)

## 2020-08-23 LAB — CBC WITH DIFFERENTIAL/PLATELET
Basophils Absolute: 0 10*3/uL (ref 0.0–0.1)
Basophils Relative: 0.9 % (ref 0.0–3.0)
Eosinophils Absolute: 0.2 10*3/uL (ref 0.0–0.7)
Eosinophils Relative: 3 % (ref 0.0–5.0)
HCT: 38.9 % (ref 36.0–46.0)
Hemoglobin: 12.8 g/dL (ref 12.0–15.0)
Lymphocytes Relative: 37.5 % (ref 12.0–46.0)
Lymphs Abs: 1.9 10*3/uL (ref 0.7–4.0)
MCHC: 32.9 g/dL (ref 30.0–36.0)
MCV: 85 fl (ref 78.0–100.0)
Monocytes Absolute: 0.4 10*3/uL (ref 0.1–1.0)
Monocytes Relative: 7.4 % (ref 3.0–12.0)
Neutro Abs: 2.6 10*3/uL (ref 1.4–7.7)
Neutrophils Relative %: 51.2 % (ref 43.0–77.0)
Platelets: 279 10*3/uL (ref 150.0–400.0)
RBC: 4.57 Mil/uL (ref 3.87–5.11)
RDW: 14.9 % (ref 11.5–15.5)
WBC: 5 10*3/uL (ref 4.0–10.5)

## 2020-08-23 LAB — IBC + FERRITIN
Ferritin: 5.3 ng/mL — ABNORMAL LOW (ref 10.0–291.0)
Iron: 51 ug/dL (ref 42–145)
Saturation Ratios: 10.3 % — ABNORMAL LOW (ref 20.0–50.0)
Transferrin: 355 mg/dL (ref 212.0–360.0)

## 2020-08-23 LAB — LIPID PANEL
Cholesterol: 193 mg/dL (ref 0–200)
HDL: 61.9 mg/dL (ref >39.00)
LDL Cholesterol: 106 mg/dL — ABNORMAL HIGH (ref 0–99)
NonHDL: 130.69
Total CHOL/HDL Ratio: 3
Triglycerides: 125 mg/dL (ref 0.0–149.0)
VLDL: 25 mg/dL (ref 0.0–40.0)

## 2020-08-23 LAB — HEMOGLOBIN A1C: Hgb A1c MFr Bld: 5.1 % (ref 4.6–6.5)

## 2020-08-23 LAB — LIPASE: Lipase: 54 U/L (ref 11.0–59.0)

## 2020-08-24 NOTE — Telephone Encounter (Signed)
Dr Diona Browner, please see my message to the patient. Referral declined by Dr Vertell Limber

## 2020-09-09 ENCOUNTER — Telehealth: Payer: Self-pay

## 2020-09-09 NOTE — Telephone Encounter (Signed)
[facility] - Client TELEPHONE ADVICE RECORD [facility] Patient Name: Kimberly Patton Gender: Female DOB: 1956-03-29 Age: 65 Y 10 M 13 D Return Phone Number: [phone] (Primary) Address: City/ State/ Zip: [place] [address] Client [facility] - Client Client Site [facility] Day Physician [name] - MD Contact Type Call Who Is Calling Patient / Member / Family / Caregiver Call Type Triage / Clinical Relationship To Patient Self Return Phone Number [phone] (Primary) Chief Complaint Eye Pain Reason for Call Symptomatic / Request for Health Information Initial Comment caller states she has a sharp pain from forehead to eye. The eye doctor said nothing is wrong. It started last night and feels like a stabbing pain. Its not constant just feels sharp. It has happened 4 times today. Translation No Nurse Assessment Nurse: Alinda Money, RN, Sarah Date/Time (Eastern Time): 09/09/2020 3:50:16 PM Confirm and document reason for call. If symptomatic, describe symptoms. ---Caller states yesterday started getting right sided sharp head pain that goes down into her eye. Saw the eye doctor this morning but only saw mild inflammation. Has started on steroid eye drops. States she continues to have sharp pains in her head that comes and goes. Does the patient have any new or worsening symptoms? ---Yes Will a triage be completed? ---Yes Related visit to physician within the last 2 weeks? ---Yes Does the PT have any chronic conditions? (i.e. diabetes, asthma, this includes High risk factors for pregnancy, etc.) ---Yes List chronic conditions. ---Meningioma, neck issues, Is this a behavioral health or substance abuse call? ---No Guidelines Guideline Title Affirmed Question Affirmed Notes Nurse Date/Time (Eastern Time) Headache [1] MODERATE headache (e.g., interferes with normal  activities) AND [2] present Goins, RN, Judson Roch 09/09/2020 3:53:12 PM PLEASE NOTE: All timestamps contained within this report are represented as [place] Standard Time. CONFIDENTIALTY NOTICE: This fax transmission is intended only for the addressee. It contains information that is legally privileged, confidential or otherwise protected from use or disclosure. If you are not the intended recipient, you are strictly prohibited from reviewing, disclosing, copying using or disseminating any of this information or taking any action in reliance on or regarding this information. If you have received this fax in error, please notify us immediately by telephone so that we can arrange for its return to [place]. Phone: [phone], Toll-Free: [phone], Fax: [phone] Page: 2 of 5 Call Id: [id number] Guidelines Guideline Title Affirmed Question Affirmed Notes Nurse Date/Time Eilene Ghazi Time) > 24 hours AND [3] unexplained (Exceptions: analgesics not tried, typical migraine, or headache part of viral illness) Disp. Time Eilene Ghazi Time) Disposition Final User 09/09/2020 3:58:53 PM See PCP within 24 Hours Yes Goins, RN, [place] Disagree/Comply Comply Caller Understands Yes PreDisposition Call Doctor Care Advice Given Per Guideline SEE PCP WITHIN 24 HOURS: * IF OFFICE WILL BE OPEN: You need to be examined within the next 24 hours. Call your doctor (or NP/PA) when the office opens and make an appointment. PAIN MEDICINES: * ACETAMINOPHEN - REGULAR STRENGTH TYLENOL: Take 650 mg (two 325 mg pills) by mouth every 4 to 6 hours as needed. Each Regular Strength Tylenol pill has 325 mg of acetaminophen. The most you should take each day is 3,250 mg (10 pills a day). REST: * Lie down in a dark quiet place and relax until feeling better. LOCAL COLD: * Apply a cold wet washcloth or cold pack to the forehead for 20 minutes. CALL BACK IF: * You become worse  CARE ADVICE given per Headache (Adult)  guideline. Referrals GO TO FACILITY UNDECIDED Triage Details User: Alinda Money RN, Sarah Date/Time: 09/09/2020 3:53:12 PM Triage Guideline: Headache Difficult to awaken or acting confused (e.g., disoriented, slurred speech) -----No [1] Weakness of the face, arm or leg on one side of the body AND [2] new-onset -----No [1] Numbness of the face, arm or leg on one side of the body AND [2] new-onset -----No PLEASE NOTE: All timestamps contained within this report are represented as [place] Standard Time. CONFIDENTIALTY NOTICE: This fax transmission is intended only for the addressee. It contains information that is legally privileged, confidential or otherwise protected from use or disclosure. If you are not the intended recipient, you are strictly prohibited from reviewing, disclosing, copying using or disseminating any of this information or taking any action in reliance on or regarding this information. If you have received this fax in error, please notify us immediately by telephone so that we can arrange for its return to [place]. Phone: [phone], Toll-Free: [phone], Fax: [phone] Page: 3 of 5 Call Id: [id number] Triage Details [1] Loss of speech or garbled speech AND [2] new-onset -----No Passed out (i.e., lost consciousness, collapsed and was not responding) -----No Sounds like a life-threatening emergency to the triager -----No Followed a head injury -----No Pregnant -----No Postpartum (from 0 to 6 weeks after delivery) -----No Traumatic Brain Injury (TBI) is suspected -----No Unable to walk, or can only walk with assistance (e.g., requires support) -----No Stiff neck (can't touch chin to chest) -----No Severe pain in one eye -----No [1] Other family members (or roommates) with headaches AND [2] possibility of carbon monoxide exposure -----No [1] SEVERE headache (e.g., excruciating) AND [2] "worst headache" of life -----No [1] SEVERE headache AND [2] sudden-onset  (i.e., reaching maximum intensity within seconds to 1 hour) -----No [1] SEVERE headache AND [2] fever -----No Loss of vision or double vision (Exception: same as prior migraines) -----No [1] Fever > 100.0 F (37.8 C) AND [2] diabetes mellitus or weak immune system (e.g., HIV positive, cancer chemo, splenectomy, organ transplant, chronic steroids) -----No Patient sounds very sick or weak to the triager PLEASE NOTE: All timestamps contained within this report are represented as [place] Standard Time. CONFIDENTIALTY NOTICE: This fax transmission is intended only for the addressee. It contains information that is legally privileged, confidential or otherwise protected from use or disclosure. If you are not the intended recipient, you are strictly prohibited from reviewing, disclosing, copying using or disseminating any of this information or taking any action in reliance on or regarding this information. If you have received this fax in error, please notify us immediately by telephone so that we can arrange for its return to [place]. Phone: [phone], Toll-Free: [phone], Fax: [phone] Page: 4 of 5 Call Id: [id number] Triage Details -----No [1] SEVERE headache (e.g., excruciating) AND [2] not improved after 2 hours of pain medicine -----No [1] Vomiting AND [2] 2 or more times (Exception: similar to previous migraines) -----No Fever > 104 F (40 C) -----No [1] MODERATE headache (e.g., interferes with normal activities) AND [2] present > 24 hours AND [3] unexplained (Exceptions: analgesics not tried, typical migraine, or headache part of viral illness) -----No [1] New headache AND [2] weak immune system (e.g., HIV positive, cancer chemo, splenectomy, organ transplant, chronic steroids) -----No [1] New headache AND [2] age > 80 -----No [1] Sinus pain of forehead AND [2] yellow or green nasal discharge -----No Fever present > 3 days (72 hours) -----No [1] MILD-MODERATE headache AND [2]  present > 72 hours -----No Headache started during exertion (e.g., sex, strenuous exercise, heavy lifting) -----No Headache is a chronic symptom (recurrent or ongoing AND present > 4 weeks) -----No [1] MODERATE headache (e.g., interferes with normal activities) AND [2] present > 24 hours AND [3] unexplained (Exceptions: analgesics not tried, typical migraine, or headache part of viral illness) -----Yes Disposition: See PCP within 24 Hours SEE PCP WITHIN 24 HOURS: * IF OFFICE WILL BE OPEN: You need to be examined within the next 24 hours. Call your doctor (or NP/PA) when the office opens and make an appointment. PAIN MEDICINES: PLEASE NOTE: All timestamps contained within this report are represented as [place] Standard Time. CONFIDENTIALTY NOTICE: This fax transmission is intended only for the addressee. It contains information that is legally privileged, confidential or otherwise protected from use or disclosure. If you are not the intended recipient, you are strictly prohibited from reviewing, disclosing, copying using or disseminating any of this information or taking any action in reliance on or regarding this information. If you have received this fax in error, please notify us immediately by telephone so that we can arrange for its return to [place]. Phone: [phone], Toll-Free: [phone], Fax: [phone] Page: 5 of 5 Call Id: [id number] Triage Details * ACETAMINOPHEN - REGULAR STRENGTH TYLENOL: Take 650 mg (two 325 mg pills) by mouth every 4 to 6 hours as needed. Each Regular Strength Tylenol pill has 325 mg of acetaminophen. The most you should take each day is 3,250 mg (10 pills a day). REST: * Lie down in a dark quiet place and relax until feeling better. LOCAL COLD: * Apply a cold wet washcloth or cold pack to the forehead for 20 minutes. CALL BACK IF: * You become worse CARE ADVICE given per Headache (Adult) guideline.

## 2020-09-09 NOTE — Telephone Encounter (Signed)
Noted  

## 2020-09-09 NOTE — Telephone Encounter (Signed)
I spoke with pt; pt said she had spoken to a second nurse just a few mins ago and was advised that pt should take a sinus pill, put heat to area and if still bothering pt tomorrow to go to [facility] or ED. I also advised pt if condition changes or worsens tonight to go to ED. Pt voiced understanding and will cb next wk with update. Sending to Dr Diona Browner.

## 2020-10-10 ENCOUNTER — Ambulatory Visit (INDEPENDENT_AMBULATORY_CARE_PROVIDER_SITE_OTHER): Payer: Medicare Other | Admitting: Cardiology

## 2020-10-10 ENCOUNTER — Other Ambulatory Visit: Payer: Self-pay

## 2020-10-10 ENCOUNTER — Encounter: Payer: Self-pay | Admitting: Cardiology

## 2020-10-10 VITALS — BP 120/86 | HR 67 | Ht 66.0 in | Wt 268.0 lb

## 2020-10-10 DIAGNOSIS — I10 Essential (primary) hypertension: Secondary | ICD-10-CM

## 2020-10-10 DIAGNOSIS — R600 Localized edema: Secondary | ICD-10-CM | POA: Diagnosis not present

## 2020-10-10 DIAGNOSIS — I471 Supraventricular tachycardia: Secondary | ICD-10-CM | POA: Diagnosis not present

## 2020-10-10 NOTE — Progress Notes (Signed)
Cardiology Office Note:    Date:  10/10/2020   ID:  Kimberly Patton, DOB December 19, 1955, MRN [id number]  PCP:  Jinny Sanders, MD  [facility] Cardiologist:  [name], MD  [place] Electrophysiologist:  None   Referring MD: Jinny Sanders, MD   Chief Complaint  Patient presents with  . Other    6 month follow up - Patient c/o swelling. Meds reviewed verbally with patient.     History of Present Illness:    Kimberly Patton is a 65 y.o. female with a hx of hypertension, hiatal hernia, GERD, iron deficiency anemia, SVT on diltiazem presenting for follow-up.   Previously seen for hypertension, and SVT.  Tolerating Cardizem SR twice daily.  Blood pressure controlled, palpitations improved.  Lower extremity edema, varicose.  Follows up with vein and vascular clinic.  Surgical or procedural intervention required.  Compression stockings recommended.  Has ringing in her ears, associated with lack of exercise.  She has gained about 30 pounds since her last clinic visit.  Ent, no intervention or management being planned.  She has started graduated exercising to help with weight loss.  Feels winded/fatigued when she overexerts herself.   Prior notes Echocardiogram 01/2020 showed normal systolic and diastolic function, EF 60 to 65%. Lexiscan Myoview 01/2020 with no evidence for ischemia. Has a history of hypokalemia while taking HCTZ. Losartan was started after stopping HCTZ. Developed nausea with losartan. This was stopped.   Past Medical History:  Diagnosis Date  . ALLERGIC RHINITIS 04/03/2007  . Anemia   . Anxiety   . Asthma    hx of years ago - no longer a problem   . ASTHMA, PERSISTENT, MODERATE 04/03/2007  . Cancer ([place])    skin   . Dyspnea on exertion    a. 11/2007 Echo: EF 60%.  . Dysrhythmia    hx of heart arrhythmia 10-15 years ago - followed by DR Lovena Le - not seen in years   . GERD (gastroesophageal reflux disease)   . 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 Hypertension   .  Joint pain    HIPS / LEGS  . Knee injury    RT  . Meningioma ([place])   . Morbid obesity ([place])   . Paroxysmal SVT (supraventricular tachycardia) ([place])    a. 11/2011 48h Holter: RSR, rare PVC's, occas PAC's  . Retinal tear of right eye 01/2016  . Ventral hernia     Past Surgical History:  Procedure Laterality Date  . APPENDECTOMY  1996  . BREAST CYST EXCISION Right 1994   Benign  . CESAREAN SECTION     x2  . CHOLECYSTECTOMY  1995  . EYE SURGERY  01/2016   Repair retinal tear  . [place] / 1996   x2  . KNEE ARTHROSCOPY W/ MENISCAL REPAIR  07/25/13   right knee Dr. Mardelle Matte  . LAPAROSCOPIC GASTRIC SLEEVE RESECTION N/A 06/08/2013   Procedure: LAPAROSCOPIC GASTRIC SLEEVE RESECTION AND EXCISION OF SEBACEUS CYST FROM MID CHEST takedown of incarcerated ventral hernia and primary repair endoscopy;  Surgeon: [name], MD;  Location: [facility];  Service: General;  Laterality: N/A;  . LAPAROSCOPIC NISSEN FUNDOPLICATION N/A 1/60/7371   Procedure: LAPAROSCOPIC REPAIR LARGE SYMPTOMATIC HIATAL HERNIA WITH UPPER ENDOSCOPY;  Surgeon: Johnathan Hausen, MD;  Location: [facility];  Service: General;  Laterality: N/A;  . moles  06/2013   removed 2 moles from under arm and  lowback  . NECK SURGERY     occipital nerve  damage- injections   . OVARIAN CYST REMOVAL  1970  . PILONIDAL CYST EXCISION  1975  . RETINAL TEAR REPAIR CRYOTHERAPY Right 01/2016   [place]  . SKIN CANCER EXCISION    . TONSILLECTOMY    . UPPER GI ENDOSCOPY  06/08/2013   Procedure: UPPER GI ENDOSCOPY;  Surgeon: [name], MD;  Location: [facility];  Service: General;;  . US ECHOCARDIOGRAPHY  12/2011   WNL - EF 55-60%, mild MR, grade 1 diastolic dysnfiction (mild)  . VENTRAL HERNIA REPAIR  2000    Current Medications: Current Meds  Medication Sig  . acetaminophen (TYLENOL) 500 MG tablet Take 1,000 mg by mouth every 6 (six) hours as needed (for pain.).  Marland Kitchen Calcium Citrate-Vitamin D (CITRACAL + D PO) Take 2 tablets by mouth  daily.   . Cyanocobalamin (VITAMIN B-12) 5000 MCG SUBL Place 5,000 mcg under the tongue daily.  Marland Kitchen diltiazem (CARDIZEM SR) 120 MG 12 hr capsule Take 1 capsule (120 mg total) by mouth 2 (two) times daily.  Marland Kitchen ELDERBERRY PO Take 1,000 mg by mouth daily.   . fluticasone (FLONASE) 50 MCG/ACT nasal spray SPRAY 2 SPRAYS INTO EACH NOSTRIL EVERY DAY  . Ginkgo Biloba 40 MG TABS Take 120 mg by mouth 2 (two) times daily.  . Lactobacillus Rhamnosus, GG, (CULTURELLE IMMUNITY SUPPORT PO) Take by mouth.  . Multiple Vitamins-Minerals (CENTRUM SILVER 50+WOMEN PO) Take by mouth.  . nitroGLYCERIN (NITROSTAT) 0.4 MG SL tablet Place 0.4 mg under the tongue every 5 (five) minutes as needed for chest pain.  Marland Kitchen PREDNISOLONE ACETATE P-F 1 % ophthalmic suspension Place 1 drop into the right eye 3 (three) times daily.  . TURMERIC PO Take by mouth.  . vitamin C (ASCORBIC ACID) 250 MG tablet Take 250 mg by mouth daily.  . Vitamins-Lipotropics (LIPO-FLAVONOID PLUS PO)    Current Facility-Administered Medications for the 10/10/20 encounter (Office Visit) with [name], MD  Medication  . methylPREDNISolone acetate (DEPO-MEDROL) injection 80 mg     Allergies:   Wasp venom, Chlorhexidine gluconate, Metronidazole, Prednisone, and Sodium hypochlorite   Social History   Socioeconomic History  . Marital status: Divorced    Spouse name: Not on file  . Number of children: 2  . Years of education: 33  . Highest education level: Not on file  Occupational History  . Occupation: Product manager: [employer]  Tobacco Use  . Smoking status: Never Smoker  . Smokeless tobacco: Never Used  . Tobacco comment: smoke at age [age]  Vaping Use  . Vaping Use: Never used  Substance and Sexual Activity  . Alcohol use: Not Currently    Alcohol/week: 0.0 standard drinks  . Drug use: No  . Sexual activity: Not on file  Other Topics Concern  . Not on file  Social History Narrative   Lives at home alone.    Right-handed.   Occasional caffeine use.   Social Determinants of Health   Financial Resource Strain: Not on file  Food Insecurity: Not on file  Transportation Needs: Not on file  Physical Activity: Not on file  Stress: Not on file  Social Connections: Not on file     Family History: The patient's family history includes Breast cancer in her mother; Diabetes in her father, mother, and another family member; Hemophilia in an other family member; Hypertension in her father, mother, and another family member; Kidney failure in her mother; Stroke in an other family member.  ROS:   Please see  the history of present illness.     All other systems reviewed and are negative.  EKGs/Labs/Other Studies Reviewed:    The following studies were reviewed today:   EKG:  EKG is  ordered today.  The ekg ordered today demonstrates normal sinus rhythm. Recent Labs: 11/23/2019: TSH 0.66 01/10/2020: Magnesium 2.1 08/23/2020: ALT 11; BUN 17; Creatinine, Ser 0.50; Hemoglobin 12.8; Platelets 279.0; Potassium 4.1; Sodium 140  Recent Lipid Panel    Component Value Date/Time   CHOL 193 08/23/2020 0801   TRIG 125.0 08/23/2020 0801   HDL 61.90 08/23/2020 0801   CHOLHDL 3 08/23/2020 0801   VLDL 25.0 08/23/2020 0801   LDLCALC 106 (H) 08/23/2020 0801    Physical Exam:    VS:  BP 120/86 (BP Location: Left Arm, Patient Position: Sitting, Cuff Size: Large)   Pulse 67   Ht 5\' 6"  (1.676 m)   Wt 268 lb (121.6 kg)   SpO2 99%   BMI 43.26 kg/m     Wt Readings from Last 3 Encounters:  10/10/20 268 lb (121.6 kg)  05/31/20 244 lb 12.8 oz (111 kg)  05/23/20 245 lb (111.1 kg)     GEN:  Well nourished, well developed in no acute distress, obese HEENT: Normal NECK: No JVD; No carotid bruits LYMPHATICS: No lymphadenopathy CARDIAC: RRR, no murmurs, rubs, gallops RESPIRATORY:  Clear to auscultation without rales, wheezing or rhonchi  ABDOMEN: Soft, non-tender, non-distended MUSCULOSKELETAL: Lower extremity  lymphedema noted SKIN: Warm and dry NEUROLOGIC:  Alert and oriented x 3 PSYCHIATRIC:  Normal affect   ASSESSMENT:    1. Primary hypertension   2. Paroxysmal SVT (supraventricular tachycardia) (HCC)   3. Leg edema   4. Morbid obesity ([place])    PLAN:    In order of problems listed above:   1. hypertension.  BP controlled.  Cardizem SR 120 mg twice daily. 2. paroxysmal SVT, symptoms controlled, continue Cardizem SR 120 mg twice daily. 3. Edema, varicose veins noted.  Compression stockings, advised. 4. Morbid obesity, graduated exercising, low-calorie diet, weight loss advised  Follow-up in 12 months.  Total encounter time more than 35 minutes  Greater than 50% was spent in counseling and coordination of care with the patient   This note was generated in part or whole with voice recognition software. Voice recognition is usually quite accurate but there are transcription errors that can and very often do occur. I apologize for any typographical errors that were not detected and corrected.  Medication Adjustments/Labs and Tests Ordered: Current medicines are reviewed at length with the patient today.  Concerns regarding medicines are outlined above.  Orders Placed This Encounter  Procedures  . EKG 12-Lead   No orders of the defined types were placed in this encounter.   Patient Instructions  Medication Instructions:  Your physician recommends that you continue on your current medications as directed. Please refer to the Current Medication list given to you today.  *If you need a refill on your cardiac medications before your next appointment, please call your pharmacy*   Lab Work: None ordered If you have labs (blood work) drawn today and your tests are completely normal, you will receive your results only by: Marland Kitchen MyChart Message (if you have MyChart) OR . A paper copy in the mail If you have any lab test that is abnormal or we need to change your treatment, we will call you  to review the results.   Testing/Procedures: None ordered   Follow-Up: At [facility], you and your  health needs are our priority.  As part of our continuing mission to provide you with exceptional heart care, we have created designated Provider Care Teams.  These Care Teams include your primary Cardiologist (physician) and Advanced Practice Providers (APPs -  Physician Assistants and Nurse Practitioners) who all work together to provide you with the care you need, when you need it.  We recommend signing up for the patient portal called "MyChart".  Sign up information is provided on this After Visit Summary.  MyChart is used to connect with patients for Virtual Visits (Telemedicine).  Patients are able to view lab/test results, encounter notes, upcoming appointments, etc.  Non-urgent messages can be sent to your provider as well.   To learn more about what you can do with MyChart, go to [email or web address].    Your next appointment:   1 year(s)  The format for your next appointment:   In Person  Provider:   [name], MD   Other Instructions      Signed, [name], MD  10/10/2020 12:31 PM    [place]

## 2020-10-10 NOTE — Patient Instructions (Signed)

## 2020-10-17 ENCOUNTER — Emergency Department (HOSPITAL_COMMUNITY): Payer: Medicare Other

## 2020-10-17 ENCOUNTER — Other Ambulatory Visit: Payer: Self-pay

## 2020-10-17 ENCOUNTER — Encounter (HOSPITAL_COMMUNITY): Payer: Self-pay

## 2020-10-17 ENCOUNTER — Telehealth: Payer: Self-pay | Admitting: Cardiology

## 2020-10-17 ENCOUNTER — Emergency Department (HOSPITAL_COMMUNITY)
Admission: EM | Admit: 2020-10-17 | Discharge: 2020-10-17 | Disposition: A | Discharge status: Home / Self Care | Payer: Medicare Other | Attending: Emergency Medicine | Admitting: Emergency Medicine

## 2020-10-17 DIAGNOSIS — R059 Cough, unspecified: Secondary | ICD-10-CM | POA: Insufficient documentation

## 2020-10-17 DIAGNOSIS — K5730 Diverticulosis of large intestine without perforation or abscess without bleeding: Secondary | ICD-10-CM | POA: Diagnosis not present

## 2020-10-17 DIAGNOSIS — R0602 Shortness of breath: Secondary | ICD-10-CM | POA: Insufficient documentation

## 2020-10-17 DIAGNOSIS — I2510 Atherosclerotic heart disease of native coronary artery without angina pectoris: Secondary | ICD-10-CM | POA: Diagnosis not present

## 2020-10-17 DIAGNOSIS — Z85828 Personal history of other malignant neoplasm of skin: Secondary | ICD-10-CM | POA: Insufficient documentation

## 2020-10-17 DIAGNOSIS — Z79899 Other long term (current) drug therapy: Secondary | ICD-10-CM | POA: Diagnosis not present

## 2020-10-17 DIAGNOSIS — R1013 Epigastric pain: Secondary | ICD-10-CM | POA: Diagnosis present

## 2020-10-17 DIAGNOSIS — J4540 Moderate persistent asthma, uncomplicated: Secondary | ICD-10-CM | POA: Diagnosis not present

## 2020-10-17 DIAGNOSIS — K219 Gastro-esophageal reflux disease without esophagitis: Secondary | ICD-10-CM | POA: Insufficient documentation

## 2020-10-17 DIAGNOSIS — R0789 Other chest pain: Secondary | ICD-10-CM | POA: Diagnosis not present

## 2020-10-17 DIAGNOSIS — R109 Unspecified abdominal pain: Secondary | ICD-10-CM | POA: Diagnosis not present

## 2020-10-17 DIAGNOSIS — I10 Essential (primary) hypertension: Secondary | ICD-10-CM | POA: Insufficient documentation

## 2020-10-17 DIAGNOSIS — R1010 Upper abdominal pain, unspecified: Secondary | ICD-10-CM | POA: Diagnosis not present

## 2020-10-17 LAB — CBC WITH DIFFERENTIAL/PLATELET
Abs Immature Granulocytes: 0.01 10*3/uL (ref 0.00–0.07)
Basophils Absolute: 0 10*3/uL (ref 0.0–0.1)
Basophils Relative: 1 %
Eosinophils Absolute: 0.1 10*3/uL (ref 0.0–0.5)
Eosinophils Relative: 2 %
HCT: 40.9 % (ref 36.0–46.0)
Hemoglobin: 13.1 g/dL (ref 12.0–15.0)
Immature Granulocytes: 0 %
Lymphocytes Relative: 28 %
Lymphs Abs: 1.8 10*3/uL (ref 0.7–4.0)
MCH: 27.6 pg (ref 26.0–34.0)
MCHC: 32 g/dL (ref 30.0–36.0)
MCV: 86.3 fL (ref 80.0–100.0)
Monocytes Absolute: 0.3 10*3/uL (ref 0.1–1.0)
Monocytes Relative: 5 %
Neutro Abs: 4.1 10*3/uL (ref 1.7–7.7)
Neutrophils Relative %: 64 %
Platelets: 293 10*3/uL (ref 150–400)
RBC: 4.74 MIL/uL (ref 3.87–5.11)
RDW: 13.9 % (ref 11.5–15.5)
WBC: 6.3 10*3/uL (ref 4.0–10.5)
nRBC: 0 % (ref 0.0–0.2)

## 2020-10-17 LAB — COMPREHENSIVE METABOLIC PANEL
ALT: 16 U/L (ref 0–44)
AST: 18 U/L (ref 15–41)
Albumin: 4 g/dL (ref 3.5–5.0)
Alkaline Phosphatase: 100 U/L (ref 38–126)
Anion gap: 11 (ref 5–15)
BUN: 20 mg/dL (ref 8–23)
CO2: 26 mmol/L (ref 22–32)
Calcium: 9.5 mg/dL (ref 8.9–10.3)
Chloride: 103 mmol/L (ref 98–111)
Creatinine, Ser: 0.52 mg/dL (ref 0.44–1.00)
GFR, Estimated: >60 mL/min (ref >60)
Glucose, Bld: 132 mg/dL — ABNORMAL HIGH (ref 70–99)
Potassium: 3.8 mmol/L (ref 3.5–5.1)
Sodium: 140 mmol/L (ref 135–145)
Total Bilirubin: 0.3 mg/dL (ref 0.3–1.2)
Total Protein: 7.9 g/dL (ref 6.5–8.1)

## 2020-10-17 LAB — LIPASE, BLOOD: Lipase: 48 U/L (ref 11–51)

## 2020-10-17 MED ORDER — IOHEXOL 300 MG/ML  SOLN
100.0000 mL | Freq: Once | INTRAMUSCULAR | Status: AC | PRN
  Administered 2020-10-17: 100 mL via INTRAVENOUS

## 2020-10-17 MED ORDER — LIDOCAINE VISCOUS HCL 2 % MT SOLN
15.0000 mL | Freq: Once | OROMUCOSAL | Status: AC
  Administered 2020-10-17: 15 mL via ORAL
  Filled 2020-10-17: qty 15

## 2020-10-17 MED ORDER — ALUM & MAG HYDROXIDE-SIMETH 200-200-20 MG/5ML PO SUSP
30.0000 mL | Freq: Once | ORAL | Status: AC
  Administered 2020-10-17: 30 mL via ORAL
  Filled 2020-10-17: qty 30

## 2020-10-17 MED ORDER — ACETAMINOPHEN 500 MG PO TABS
1000.0000 mg | ORAL_TABLET | Freq: Once | ORAL | Status: AC
  Administered 2020-10-17: 1000 mg via ORAL
  Filled 2020-10-17: qty 2

## 2020-10-17 MED ORDER — HYDROCODONE-ACETAMINOPHEN 5-325 MG PO TABS: 1.0000 | ORAL_TABLET | Freq: Four times a day (QID) | ORAL | 0 refills | Status: DC | PRN

## 2020-10-17 NOTE — ED Provider Notes (Signed)
[facility] DEPT Provider Note   CSN: [id number] Arrival date & time: 10/17/20  1207     History Chief Complaint  Patient presents with  . Shortness of Breath  . Abdominal Pain    Kimberly Patton is a 65 y.o. female.  Patient is a 65 year old female with a history of paroxysmal SVT, hypertension, asthma, anemia, CAD, prior hiatal hernia repair who is presenting with severe epigastric discomfort that started on Friday.  She reports the pain has been gradually worsening.  It is not affected by eating but is worse with taking a deep breath and with occasional coughing.  She has had no nausea or vomiting.  She has been having normal bowel movements.  She denies any fever.  She states she is short of breath however reports that when she takes a deep breath the pain is worse if she is breathing shallow.  She has had no URI symptoms.  She spoke with Dr. Hassell Done her general surgeon today and they recommended she come here for evaluation in case there is something else going on.  She has been taking her home medications including her PPI.  She has not been using NSAIDs or alcohol.  The history is provided by the patient.  Shortness of Breath Associated symptoms: abdominal pain   Abdominal Pain Associated symptoms: shortness of breath        Past Medical History:  Diagnosis Date  . ALLERGIC RHINITIS 04/03/2007  . Anemia   . Anxiety   . Asthma    hx of years ago - no longer a problem   . ASTHMA, PERSISTENT, MODERATE 04/03/2007  . Cancer ([place])    skin   . Dyspnea on exertion    a. 11/2007 Echo: EF 60%.  . Dysrhythmia    hx of heart arrhythmia 10-15 years ago - followed by DR Lovena Le - not seen in years   . GERD (gastroesophageal reflux disease)   . 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 Hypertension   . Joint pain    HIPS / LEGS  . Knee injury    RT  . Meningioma ([place])   . Morbid obesity ([place])   . Paroxysmal SVT (supraventricular tachycardia) ([place])    a. 11/2011 48h  Holter: RSR, rare PVC's, occas PAC's  . Retinal tear of right eye 01/2016  . Ventral hernia     Patient Active Problem List   Diagnosis Date Noted  . Tinnitus of both ears 07/21/2020  . Cervical radiculopathy 06/28/2020  . Acute bronchitis 06/06/2020  . Chronic venous insufficiency 05/29/2020  . Lymphedema 05/29/2020  . CAD (coronary artery disease) 05/29/2020  . Cervical lymphadenitis 04/18/2020  . Dysuria 03/25/2020  . History of repair of hiatal hernia 02/02/2020  . Bradycardia 01/01/2020  . Orthostatic hypotension 01/01/2020  . Globus sensation 11/23/2019  . Sore throat 10/22/2019  . Nausea 08/27/2019  . Elevated lipase 08/27/2019  . Abdominal pain, epigastric 08/11/2019  . Hypercholesteremia 06/09/2019  . Neck swelling 02/14/2019  . Right ear pain 09/12/2018  . Chronic neuropathic pain 07/31/2018  . Thyroid nodule 04/15/2018  . Skin lesion of scalp 09/06/2017  . Hypokalemia 06/08/2017  . Iron deficiency anemia 06/08/2017  . Osteoporosis 12/04/2016  . Osteoarthritis of knee 07/17/2016  . Numbness and tingling of both feet 01/27/2016  . Meningioma ([place]) 01/18/2016  . Family history of aortic stenosis 01/20/2015  . Status post laparoscopic sleeve gastrectomy Dec 2014 06/08/2013  . Morbid obesity ([place]) 12/08/2012  . HTN (hypertension) 10/11/2011  .  Varicose veins of bilateral lower extremities with other complications 84/13/2440  . Palpitations 09/12/2010  . FIBROIDS, UTERUS 04/03/2007  . Allergic rhinitis 04/03/2007  . Asthma, mild intermittent 04/03/2007    Past Surgical History:  Procedure Laterality Date  . APPENDECTOMY  1996  . BREAST CYST EXCISION Right 1994   Benign  . CESAREAN SECTION     x2  . CHOLECYSTECTOMY  1995  . EYE SURGERY  01/2016   Repair retinal tear  . [place] / 1996   x2  . HERNIA REPAIR    . KNEE ARTHROSCOPY W/ MENISCAL REPAIR  07/25/13   right knee Dr. Mardelle Matte  . LAPAROSCOPIC GASTRIC SLEEVE RESECTION N/A 06/08/2013    Procedure: LAPAROSCOPIC GASTRIC SLEEVE RESECTION AND EXCISION OF SEBACEUS CYST FROM MID CHEST takedown of incarcerated ventral hernia and primary repair endoscopy;  Surgeon: [name], MD;  Location: [facility] ORS;  Service: General;  Laterality: N/A;  . LAPAROSCOPIC NISSEN FUNDOPLICATION N/A 06/13/7251   Procedure: LAPAROSCOPIC REPAIR LARGE SYMPTOMATIC HIATAL HERNIA WITH UPPER ENDOSCOPY;  Surgeon: Johnathan Hausen, MD;  Location: [facility];  Service: General;  Laterality: N/A;  . moles  06/2013   removed 2 moles from under arm and  lowback  . NECK SURGERY     occipital nerve damage- injections   . OVARIAN CYST REMOVAL  1970  . PILONIDAL CYST EXCISION  1975  . RETINAL TEAR REPAIR CRYOTHERAPY Right 01/2016   [place]  . SKIN CANCER EXCISION    . TONSILLECTOMY    . UPPER GI ENDOSCOPY  06/08/2013   Procedure: UPPER GI ENDOSCOPY;  Surgeon: [name], MD;  Location: [facility];  Service: General;;  . US ECHOCARDIOGRAPHY  12/2011   WNL - EF 55-60%, mild MR, grade 1 diastolic dysnfiction (mild)  . VENTRAL HERNIA REPAIR  2000     OB History    Gravida  3   Para  2   Term  2   Preterm      AB  1   Living  2     SAB  1   IAB      Ectopic      Multiple      Live Births  2           Family History  Problem Relation Age of Onset  . Breast cancer Mother   . Diabetes Mother   . Hypertension Mother   . Kidney failure Mother   . Diabetes Father   . Hypertension Father   . Diabetes Other   . Hypertension Other   . Stroke Other   . Hemophilia Other     Social History   Tobacco Use  . Smoking status: Never Smoker  . Smokeless tobacco: Never Used  . Tobacco comment: smoke at age 7-12  Vaping Use  . Vaping Use: Never used  Substance Use Topics  . Alcohol use: Not Currently    Alcohol/week: 0.0 standard drinks  . Drug use: No    Home Medications Prior to Admission medications   Medication Sig Start Date End Date Taking? Authorizing Provider  acetaminophen (TYLENOL)  500 MG tablet Take 1,000 mg by mouth every 6 (six) hours as needed (for pain.).    [provider]  Calcium Citrate-Vitamin D (CITRACAL + D PO) Take 2 tablets by mouth daily.     [provider]  Cyanocobalamin (VITAMIN B-12) 5000 MCG SUBL Place 5,000 mcg under the tongue daily.    [provider]  diltiazem (CARDIZEM SR) 120 MG 12 hr capsule Take 1 capsule (120 mg total) by mouth 2 (two) times daily. 04/20/20   Vickie Epley, MD  ELDERBERRY PO Take 1,000 mg by mouth daily.     [provider]  fluticasone (FLONASE) 50 MCG/ACT nasal spray SPRAY 2 SPRAYS INTO EACH NOSTRIL EVERY DAY 03/04/20   Bedsole, Amy E, MD  Ginkgo Biloba 40 MG TABS Take 120 mg by mouth 2 (two) times daily.    [provider]  Lactobacillus Rhamnosus, GG, (CULTURELLE IMMUNITY SUPPORT PO) Take by mouth.    [provider]  Multiple Vitamins-Minerals (CENTRUM SILVER 50+WOMEN PO) Take by mouth.    [provider]  nitroGLYCERIN (NITROSTAT) 0.4 MG SL tablet Place 0.4 mg under the tongue every 5 (five) minutes as needed for chest pain.    [provider]  PREDNISOLONE ACETATE P-F 1 % ophthalmic suspension Place 1 drop into the right eye 3 (three) times daily. 09/09/20   [provider]  TURMERIC PO Take by mouth.    [provider]  vitamin C (ASCORBIC ACID) 250 MG tablet Take 250 mg by mouth daily.    [provider]  Vitamins-Lipotropics (LIPO-FLAVONOID PLUS PO)  06/15/20   [provider]    Allergies    Wasp venom, Chlorhexidine gluconate, Metronidazole, Prednisone, and Sodium hypochlorite  Review of Systems   Review of Systems  Respiratory: Positive for shortness of breath.   Gastrointestinal: Positive for abdominal pain.  All other systems reviewed and are negative.   Physical Exam Updated Vital Signs BP 124/88   Pulse (!) 59   Temp 97.8 F (36.6 C) (Oral)   Resp (!) 22   Ht 5\' 6"  (1.676 m)   Wt 117.9 kg    SpO2 100%   BMI 41.97 kg/m   Physical Exam Vitals and nursing note reviewed.  Constitutional:      General: She is in acute distress.     Appearance: She is well-developed.     Comments: Appears uncomfortable  HENT:     Head: Normocephalic and atraumatic.     Mouth/Throat:     Mouth: Mucous membranes are moist.  Eyes:     Pupils: Pupils are equal, round, and reactive to light.  Cardiovascular:     Rate and Rhythm: Normal rate and regular rhythm.     Pulses: Normal pulses.     Heart sounds: Normal heart sounds. No murmur heard. No friction rub.  Pulmonary:     Effort: Pulmonary effort is normal. Tachypnea present.     Breath sounds: Normal breath sounds. No wheezing or rales.  Abdominal:     General: Bowel sounds are normal. There is no distension.     Palpations: Abdomen is soft.     Tenderness: There is abdominal tenderness in the epigastric area. There is no guarding or rebound.     Comments: Pain with palpation of the xiphoid process  Musculoskeletal:        General: No tenderness. Normal range of motion.     Cervical back: Normal range of motion and neck supple.     Right lower leg: No edema.     Left lower leg: No edema.     Comments: No edema  Skin:    General: Skin is warm and dry.     Findings: No rash.  Neurological:     Mental Status: She is alert and oriented to person, place, and time. Mental status is at baseline.  Cranial Nerves: No cranial nerve deficit.  Psychiatric:        Mood and Affect: Mood normal.        Behavior: Behavior normal.     ED Results / Procedures / Treatments   Labs (all labs ordered are listed, but only abnormal results are displayed) Labs Reviewed  COMPREHENSIVE METABOLIC PANEL - Abnormal; Notable for the following components:      Result Value   Glucose, Bld 132 (*)    All other components within normal limits  CBC WITH DIFFERENTIAL/PLATELET  LIPASE, BLOOD  URINALYSIS, ROUTINE W REFLEX MICROSCOPIC    EKG EKG  Interpretation  Date/Time:  Monday Oct 17 2020 18:58:38 EDT Ventricular Rate:  70 PR Interval:  159 QRS Duration: 119 QT Interval:  412 QTC Calculation: 445 R Axis:   -17 Text Interpretation: Sinus rhythm Incomplete RBBB and LAFB Low voltage, precordial leads No significant change since last tracing Confirmed by Blanchie Dessert [phone]) on 10/17/2020 7:07:15 PM   Radiology CT ABDOMEN PELVIS W CONTRAST  Result Date: 10/17/2020 CLINICAL DATA:  Upper abdominal pain for several days, initial encounter EXAM: CT ABDOMEN AND PELVIS WITH CONTRAST TECHNIQUE: Multidetector CT imaging of the abdomen and pelvis was performed using the standard protocol following bolus administration of intravenous contrast. CONTRAST:  127mL OMNIPAQUE IOHEXOL 300 MG/ML  SOLN COMPARISON:  03/09/2020 FINDINGS: Lower chest: Lung bases are free of acute infiltrate or sizable effusion. There is a focal 6-7 mm nodule in the right lower lobe best seen on image number 21 of series 6. This was not visualized on the prior CT examination. Hepatobiliary: No focal liver abnormality is seen. Status post cholecystectomy. No biliary dilatation. Pancreas: Unremarkable. No pancreatic ductal dilatation or surrounding inflammatory changes. Spleen: Normal in size without focal abnormality. Adrenals/Urinary Tract: Adrenal glands are within normal limits. Right kidney demonstrates a normal enhancement pattern. Normal excretion is noted. Left kidney demonstrates a large upper pole renal cyst measuring approximately 6.6 cm stable in appearance from the prior exam. No renal calculi or obstructive changes are noted. Bladder is partially distended Stomach/Bowel: Mild diverticular changes noted. No evidence of diverticulitis is seen. The appendix is not well visualized consistent with prior surgical history. Small bowel is within normal limits. Small sliding-type hiatal hernia and is seen. Changes of sleeve gastrectomy are again noted. Vascular/Lymphatic:  Aortic atherosclerosis. No enlarged abdominal or pelvic lymph nodes. Reproductive: Multiple calcified uterine fibroids are seen. No adnexal mass is noted. Other: No abdominal wall hernia or abnormality. No abdominopelvic ascites. Changes of prior ventral hernia repair are noted. Musculoskeletal: No acute or significant osseous findings. IMPRESSION: Diverticulosis without diverticulitis. Chronic postsurgical changes involving the stomach. New 6-7 mm nodule in the right lower lobe. This was not seen on the prior exam. Non-contrast chest CT at 6-12 months is recommended. If the nodule is stable at time of repeat CT, then future CT at 18-24 months (from today's scan) is considered optional for low-risk patients, but is recommended for high-risk patients. This recommendation follows the consensus statement: Guidelines for Management of Incidental Pulmonary Nodules Detected on CT Images: From the Fleischner Society 2017; Radiology 2017; 284:228-243. Electronically Signed   By: Inez Catalina M.D.   On: 10/17/2020 16:49   DG Abdomen Acute W/Chest  Result Date: 10/17/2020 CLINICAL DATA:  Right upper abdominal pain 3 days with shortness of breath. EXAM: DG ABDOMEN ACUTE WITH 1 VIEW CHEST COMPARISON:  Chest x-ray 04/04/2020 and CT abdomen 03/09/2020 FINDINGS: Lungs are adequately inflated and otherwise clear. Cardiomediastinal silhouette  and remainder of the chest is unchanged. No free subdiaphragmatic air. Abdominopelvic images demonstrate surgical suture line over the region of the gastroesophageal junction compatible with prior hiatal hernia repair. Surgical clips over the right upper quadrant. Surgical clips over the lower abdomen compatible previous abdominal wall hernia repair. Bowel gas pattern is nonobstructive. No free peritoneal air. Mild fecal retention over the right colon. Known 6 cm calcified uterine fibroid. Remainder the exam is unchanged. IMPRESSION: 1. Nonobstructive bowel gas pattern. 2. No acute  cardiopulmonary disease. Electronically Signed   By: Marin Olp M.D.   On: 10/17/2020 14:06    Procedures Procedures   Medications Ordered in ED Medications  acetaminophen (TYLENOL) tablet 1,000 mg (1,000 mg Oral Given 10/17/20 1555)  alum & mag hydroxide-simeth (MAALOX/MYLANTA) 200-200-20 MG/5ML suspension 30 mL (30 mLs Oral Given 10/17/20 1558)    And  lidocaine (XYLOCAINE) 2 % viscous mouth solution 15 mL (15 mLs Oral Given 10/17/20 1558)  iohexol (OMNIPAQUE) 300 MG/ML solution 100 mL (100 mLs Intravenous Contrast Given 10/17/20 1635)    ED Course  I have reviewed the triage vital signs and the nursing notes.  Pertinent labs & imaging results that were available during my care of the patient were reviewed by me and considered in my medical decision making (see chart for details).    MDM Rules/Calculators/A&P                          Patient is a 65 year old female presenting today with epigastric/xiphoid pain that has been present since Friday.  Pain is worse with taking deep breaths and certain movements.  Is not affected by eating.  She denies any infectious symptoms.  She has been having normal bowel movements.  Patient has prior hiatal hernia surgery and was concerned there was a complication with the surgery.  She denies having symptoms like this in the past.  Patient is satting 100% on room air but does appear uncomfortable and is tearful on exam.  She has tenderness with palpation of the xiphoid proces and some epigastric tenderness.  She has had no hematemesis or melena.  She takes no anticoagulation and is not taking anything that would irritate the stomach such as alcohol, NSAIDs or aspirin.  Labs are reassuring with a normal CBC, CMP and lipase.  Plain abdomen and chest was negative with a nonobstructive bowel pattern.  Given findings and prior history CT was ordered of the abdomen pelvis. CT shows diverticulosis without diverticulitis.  There are chronic postsurgical changes  involving the stomach but no acute changes.  There is a new 6 to 7 mm nodule in the right lobe that was not seen on prior exam and they recommended a noncontrast chest CT in 6 to 12 months for reevaluation.  Patient was given GI cocktail and Tylenol.  On reevaluation is still having discomfort but does appear more comfortable.  Sats remain 99% on room air.  Patient initially was hypertensive however when an appropriately sized cuff was placed on her blood pressure is 140/80.  Will do EKG to ensure no other acute process which is unchanged.  Feel patient's pain is most likely musculoskeletal but cannot 100% rule out esophageal spasm or GI component.  However seems less likely.  No significant improvement after GI cocktail but Tylenol did seem to help some.  Will give patient pain control.  We will have her follow-up with Dr. Hassell Done as planned.  Patient is comfortable with this plan and  wishes to go home.  Final Clinical Impression(s) / ED Diagnoses Final diagnoses:  Epigastric pain  Xiphoid pain    Rx / DC Orders ED Discharge Orders         Ordered    HYDROcodone-acetaminophen (NORCO/VICODIN) 5-325 MG tablet  Every 6 hours PRN        10/17/20 1908           Blanchie Dessert, MD 10/17/20 1909

## 2020-10-17 NOTE — ED Triage Notes (Signed)
Patient c/o SOB, mid and right upper abdominal pain x 3 days. Patient reports that she has had surgery for a hiatal hernia.  Patient refused EKG. Patient states, "I know it is from my hiatal hernia. I just had an EKG done last week."

## 2020-10-17 NOTE — Telephone Encounter (Signed)
Pt c/o Shortness Of Breath: STAT if SOB developed within the last 24 hours or pt is noticeably SOB on the phone  1. Are you currently SOB (can you hear that pt is SOB on the phone)? yes  2. How long have you been experiencing SOB? Since friday  3. Are you SOB when sitting or when up moving around? Moving   4. Are you currently experiencing any other symptoms?feels like heart is racing (HR and BP are normal per fitbit readings). Patient has reported she has been walking more, nothing strenuous, just to help with weight loss.  Patient was last seen 10/10/20 in office with Dr.Agbot-Etang

## 2020-10-17 NOTE — Discharge Instructions (Signed)
Everything with your CAT scan and blood work today look normal.  Your EKG also was reassuring.  Continue to take your medications as prescribed.  You were given a prescription for some pain medication.  There is no evidence of any blockage or problem with your hiatal hernia surgery.  If you start having inability to keep your food down vomiting or not having any bowel movements you should return to the emergency room.

## 2020-10-17 NOTE — Telephone Encounter (Addendum)
Called pt back. No answer. Lmtcb.  Per chart review, pt is currently at Emergency Room at [facility].

## 2020-10-17 NOTE — ED Notes (Signed)
PT REFUSED EKG, PT STATED SHE HAD ALREADY HAD ONE DONE

## 2020-10-17 NOTE — ED Provider Notes (Signed)
Emergency Medicine Provider Triage Evaluation Note  Kimberly Patton , a 65 y.o. female  was evaluated in triage.  Pt complains of upper abdominal pain and shortness of breath.  Symptoms began 3 to 4 days ago, began with shortness of breath.  Symptoms are present when she stands upright, but no pain at rest.  Also pain with palpation.  History of Niesen fundoplication, gastric bypass surgery, lap chole, cystectomy and oophorectomy.  Takes pantoprazole for GERD.  No fevers, chills, cough, chest pain, lower abdominal pain  Review of Systems  Positive: Upper abd pain, sob Negative: Fever, cough, n/v  Physical Exam  BP (!) 152/86 (BP Location: Left Arm)   Pulse 67   Temp 97.8 F (36.6 C) (Oral)   Resp 20   Ht 5\' 6"  (1.676 m)   Wt 117.9 kg   SpO2 100%   BMI 41.97 kg/m  Gen:   Awake, no distress   Resp:  Normal effort, clear lung sounds MSK:   Moves extremities without difficulty Other:  Tenderness palpation of epigastric abdomen and xiphoid process.  Additionally mild tenderness palpation of the right and left upper quadrant abdomen.  No rigidity, guarding, distention.  Medical Decision Making  Medically screening exam initiated at 12:37 PM.  Appropriate orders placed.  ALIANNA WURSTER was informed that the remainder of the evaluation will be completed by another provider, this initial triage assessment does not replace that evaluation, and the importance of remaining in the ED until their evaluation is complete.  Labs and acute abd series to start.    Franchot Heidelberg, PA-C 10/17/20 1239    Valarie Merino, MD 10/19/20 [phone]

## 2020-10-18 ENCOUNTER — Other Ambulatory Visit: Payer: Self-pay | Admitting: Family Medicine

## 2020-10-18 DIAGNOSIS — R911 Solitary pulmonary nodule: Secondary | ICD-10-CM

## 2020-10-18 DIAGNOSIS — R0602 Shortness of breath: Secondary | ICD-10-CM

## 2020-10-18 NOTE — Progress Notes (Signed)
There is a new 6 to 7 mm nodule in the right lobe that was not seen on prior exam and they recommended a noncontrast chest CT in 6 to 12 months

## 2020-10-26 DIAGNOSIS — Z903 Acquired absence of stomach [part of]: Secondary | ICD-10-CM | POA: Diagnosis not present

## 2020-11-21 ENCOUNTER — Ambulatory Visit (INDEPENDENT_AMBULATORY_CARE_PROVIDER_SITE_OTHER): Payer: BC Managed Care – PPO | Admitting: Vascular Surgery

## 2020-12-02 ENCOUNTER — Other Ambulatory Visit: Payer: Self-pay | Admitting: Family Medicine

## 2020-12-21 ENCOUNTER — Emergency Department
Admission: EM | Admit: 2020-12-21 | Discharge: 2020-12-21 | Disposition: A | Discharge status: Home / Self Care | Payer: BC Managed Care – PPO

## 2020-12-21 ENCOUNTER — Telehealth: Payer: Self-pay | Admitting: Cardiology

## 2020-12-21 MED ORDER — HYDROCHLOROTHIAZIDE 12.5 MG PO CAPS: 12.5000 mg | ORAL_CAPSULE | Freq: Every day | ORAL | 0 refills | Status: DC

## 2020-12-21 NOTE — Telephone Encounter (Signed)
Spoke with patient and reviewed Dr. Thereasa Solo recommendations. Discussed medication he wants her to start was HCTZ 12.5 mg once daily and confirmed her pharmacy. We discussed her blood pressures and numbers which would require her to be seen in the ED. She then went on to say that she went to [facility] and blood pressure was normal but she had some shoulder pain. She states this pain she has had for some pain due to previous injury. Inquired if this has changed from normal and she denies any changes. She states this is not new and not changed from normal pain she has there. Discussed signs and symptoms which would require ED visit such as chest pain, shortness of breath, increase or changes in that shoulder pain, jaw pain, or radiation down that left arm. Also reviewed blood pressures that would require immediate evaluation. Scheduled her to come in on Friday to see Dr. Garen Lah. Provided emotional support regarding her concerns and advised if she should need anything or has further concerns she could call back if needed. She verbalized understanding of our conversation, agreement with plan, and had no further questions at this time.     Kimberly Sable, MD  Caller: Unspecified (Today,  8:57 AM) If blood pressure stays elevated, start HCTZ 12.5 mg daily.  Please schedule outpatient follow-up in the office for BP check with APP or myself if my schedule allows.  If she has worsening symptoms, she can go to the ED or urgentcare for evaluation.  Thank you

## 2020-12-21 NOTE — Telephone Encounter (Signed)
Patient wants to know if she can be worked in today and would like to be advised if she is having an emergency .     Please call asap even if you dont have an answer.

## 2020-12-21 NOTE — Telephone Encounter (Signed)
Patient called back with blood pressure reading of 152/99. Her concern is now the diastolic number is 99. Advised that I will update her chart and just waiting on provider to review and any recommendations. She verbalized understanding of our conversation with no further questions.

## 2020-12-21 NOTE — Telephone Encounter (Signed)
Spoke with patient and she is going to take right now. Patient took medications at 06:30 this morning and checked blood pressure at 08:51 am and it was 150/94 with HR 74. Patient states she just felt weird and that prompted her to check it. Blood pressure while on phone was 159/92. Reviewed that I would send this over to provider for review and we will then be in touch with any further recommendations. She was appreciative for the call with no further questions for now.

## 2020-12-21 NOTE — Telephone Encounter (Signed)
Pt c/o BP issue: STAT if pt c/o blurred vision, one-sided weakness or slurred speech  1. What are your last 5 BP readings?   7/13  @ 0851   150/94  74. Took bp meds @ 630   2. Are you having any other symptoms (ex. Dizziness, headache, blurred vision, passed out)? Doesn't feel right   3. What is your BP issue? Elevated more than usual normal trend is 120/70-80

## 2020-12-23 ENCOUNTER — Other Ambulatory Visit: Payer: Self-pay

## 2020-12-23 ENCOUNTER — Ambulatory Visit (INDEPENDENT_AMBULATORY_CARE_PROVIDER_SITE_OTHER): Payer: Medicare Other | Admitting: Cardiology

## 2020-12-23 ENCOUNTER — Encounter: Payer: Self-pay | Admitting: Cardiology

## 2020-12-23 VITALS — BP 138/94 | HR 73 | Ht 66.0 in | Wt 267.0 lb

## 2020-12-23 DIAGNOSIS — I471 Supraventricular tachycardia: Secondary | ICD-10-CM

## 2020-12-23 DIAGNOSIS — I10 Essential (primary) hypertension: Secondary | ICD-10-CM

## 2020-12-23 NOTE — Progress Notes (Signed)
Cardiology Office Note:    Date:  12/23/2020   ID:  Kimberly Patton, DOB Mar 07, 1956, MRN [id number]  PCP:  Jinny Sanders, MD  [facility] Cardiologist:  [name], MD  [facility] Electrophysiologist:  None   Referring MD: Jinny Sanders, MD   Chief Complaint  Patient presents with   Other    BP issues. Meds reviewed verbally with patient.     History of Present Illness:    Kimberly Patton is a 65 y.o. female with a hx of hypertension, hiatal hernia, GERD, iron deficiency anemia, SVT on diltiazem presenting due to elevated blood pressures.  Patient has some neck pain, recently had a procedure/injection to her neck to relieve the pain.  She has been dealing with this for the past month.  She noticed her blood pressures have been elevated of late especially diastolic.  Started on HCTZ 12.5 mg daily which she has been taking for 2 days.  Blood pressures overall have improved.  States not being able to do much, has gained about 20 pounds over the past 2 to 3 months.   Prior notes Echocardiogram 01/2020 showed normal systolic and diastolic function, EF 60 to 65%. Lexiscan Myoview 01/2020 with no evidence for ischemia. Has a history of hypokalemia while taking HCTZ. Losartan was started after stopping HCTZ. Developed nausea with losartan. This was stopped.   Past Medical History:  Diagnosis Date   ALLERGIC RHINITIS 04/03/2007   Anemia    Anxiety    Asthma    hx of years ago - no longer a problem    ASTHMA, PERSISTENT, MODERATE 04/03/2007   Cancer ([place])    skin    Dyspnea on exertion    a. 11/2007 Echo: EF 60%.   Dysrhythmia    hx of heart arrhythmia 10-15 years ago - followed by DR Lovena Le - not seen in years    GERD (gastroesophageal reflux disease)    Hemophilia carrier    Hypertension    Joint pain    HIPS / LEGS   Knee injury    RT   Meningioma ([place])    Morbid obesity ([place])    Paroxysmal SVT (supraventricular tachycardia) ([place])    a. 11/2011 48h Holter:  RSR, rare PVC's, occas PAC's   Retinal tear of right eye 01/2016   Ventral hernia     Past Surgical History:  Procedure Laterality Date   APPENDECTOMY  1996   BREAST CYST EXCISION Right 1994   Benign   CESAREAN SECTION     x2   [place]  01/2016   Repair retinal tear   FOOT SURGERY  1995 / 1996   x2   HERNIA REPAIR     KNEE ARTHROSCOPY W/ MENISCAL REPAIR  07/25/13   right knee Dr. Mardelle Matte   LAPAROSCOPIC GASTRIC SLEEVE RESECTION N/A 06/08/2013   Procedure: LAPAROSCOPIC GASTRIC SLEEVE RESECTION AND EXCISION OF SEBACEUS CYST FROM MID CHEST takedown of incarcerated ventral hernia and primary repair endoscopy;  Surgeon: [name], MD;  Location: [facility];  Service: General;  Laterality: N/A;   LAPAROSCOPIC NISSEN FUNDOPLICATION N/A 9/47/0962   Procedure: LAPAROSCOPIC REPAIR LARGE SYMPTOMATIC HIATAL HERNIA WITH UPPER ENDOSCOPY;  Surgeon: Johnathan Hausen, MD;  Location: [facility];  Service: General;  Laterality: N/A;   moles  06/2013   removed 2 moles from under arm and  lowback   NECK SURGERY     occipital nerve damage- injections    OVARIAN CYST  REMOVAL  1970   PILONIDAL CYST EXCISION  1975   RETINAL TEAR REPAIR CRYOTHERAPY Right 01/2016   [place]   SKIN CANCER EXCISION     TONSILLECTOMY     UPPER GI ENDOSCOPY  06/08/2013   Procedure: UPPER GI ENDOSCOPY;  Surgeon: [name], MD;  Location: [facility];  Service: General;;   US ECHOCARDIOGRAPHY  12/2011   WNL - EF 55-60%, mild MR, grade 1 diastolic dysnfiction (mild)   VENTRAL HERNIA REPAIR  2000    Current Medications: Current Meds  Medication Sig   acetaminophen (TYLENOL) 500 MG tablet Take 1,000 mg by mouth every 6 (six) hours as needed (for pain.).   Calcium Citrate-Vitamin D (CITRACAL + D PO) Take 2 tablets by mouth daily.    Cyanocobalamin (VITAMIN B-12) 5000 MCG SUBL Place 5,000 mcg under the tongue daily.   diltiazem (CARDIZEM SR) 120 MG 12 hr capsule Take 1 capsule (120 mg total) by mouth 2  (two) times daily.   ELDERBERRY PO Take 1,000 mg by mouth daily.    fluticasone (FLONASE) 50 MCG/ACT nasal spray SPRAY 2 SPRAYS INTO EACH NOSTRIL EVERY DAY   Ginkgo Biloba 40 MG TABS Take 120 mg by mouth 2 (two) times daily.   hydrochlorothiazide (MICROZIDE) 12.5 MG capsule Take 1 capsule (12.5 mg total) by mouth daily.   HYDROcodone-acetaminophen (NORCO/VICODIN) 5-325 MG tablet Take 1 tablet by mouth every 6 (six) hours as needed for severe pain.   Lactobacillus Rhamnosus, GG, (CULTURELLE IMMUNITY SUPPORT PO) Take by mouth.   Multiple Vitamins-Minerals (CENTRUM SILVER 50+WOMEN PO) Take by mouth.   nitroGLYCERIN (NITROSTAT) 0.4 MG SL tablet Place 0.4 mg under the tongue every 5 (five) minutes as needed for chest pain.   pantoprazole (PROTONIX) 40 MG tablet Take 40 mg by mouth daily.   PREDNISOLONE ACETATE P-F 1 % ophthalmic suspension Place 1 drop into the right eye 3 (three) times daily.   TURMERIC PO Take by mouth.   vitamin C (ASCORBIC ACID) 250 MG tablet Take 250 mg by mouth daily.   Vitamins-Lipotropics (LIPO-FLAVONOID PLUS PO)    Current Facility-Administered Medications for the 12/23/20 encounter (Office Visit) with [name], MD  Medication   methylPREDNISolone acetate (DEPO-MEDROL) injection 80 mg     Allergies:   Wasp venom, Chlorhexidine gluconate, Metronidazole, Prednisone, and Sodium hypochlorite   Social History   Socioeconomic History   Marital status: Divorced    Spouse name: Not on file   Number of children: 2   Years of education: 17   Highest education level: Not on file  Occupational History   Occupation: Product manager: [employer]  Tobacco Use   Smoking status: Never   Smokeless tobacco: Never   Tobacco comments:    smoke at age [age]  Vaping Use   Vaping Use: Never used  Substance and Sexual Activity   Alcohol use: Not Currently    Alcohol/week: 0.0 standard drinks   Drug use: No   Sexual activity: Not on file  Other Topics  Concern   Not on file  Social History Narrative   Lives at home alone.   Right-handed.   Occasional caffeine use.   Social Determinants of Health   Financial Resource Strain: Not on file  Food Insecurity: Not on file  Transportation Needs: Not on file  Physical Activity: Not on file  Stress: Not on file  Social Connections: Not on file     Family History: The patient's family history includes Breast cancer  in her mother; Diabetes in her father, mother, and another family member; Hemophilia in an other family member; Hypertension in her father, mother, and another family member; Kidney failure in her mother; Stroke in an other family member.  ROS:   Please see the history of present illness.     All other systems reviewed and are negative.  EKGs/Labs/Other Studies Reviewed:    The following studies were reviewed today:   EKG:  EKG is  ordered today.  The ekg ordered today demonstrates normal sinus rhythm. Recent Labs: 01/10/2020: Magnesium 2.1 10/17/2020: ALT 16; BUN 20; Creatinine, Ser 0.52; Hemoglobin 13.1; Platelets 293; Potassium 3.8; Sodium 140  Recent Lipid Panel    Component Value Date/Time   CHOL 193 08/23/2020 0801   TRIG 125.0 08/23/2020 0801   HDL 61.90 08/23/2020 0801   CHOLHDL 3 08/23/2020 0801   VLDL 25.0 08/23/2020 0801   LDLCALC 106 (H) 08/23/2020 0801    Physical Exam:    VS:  BP (!) 138/94 (BP Location: Left Arm, Patient Position: Sitting, Cuff Size: Large)   Pulse 73   Ht 5\' 6"  (1.676 m)   Wt 267 lb (121.1 kg)   SpO2 97%   BMI 43.09 kg/m     Wt Readings from Last 3 Encounters:  12/23/20 267 lb (121.1 kg)  10/17/20 260 lb (117.9 kg)  10/10/20 268 lb (121.6 kg)     GEN:  Well nourished, well developed in no acute distress, obese HEENT: Normal NECK: No JVD; No carotid bruits LYMPHATICS: No lymphadenopathy CARDIAC: RRR, no murmurs, rubs, gallops RESPIRATORY:  Clear to auscultation without rales, wheezing or rhonchi  ABDOMEN: Soft,  non-tender, non-distended MUSCULOSKELETAL: Lower extremity lymphedema noted SKIN: Warm and dry NEUROLOGIC:  Alert and oriented x 3 PSYCHIATRIC:  Normal affect   ASSESSMENT:    1. Primary hypertension   2. Paroxysmal SVT (supraventricular tachycardia) ([place])   3. Morbid obesity ([place])     PLAN:    In order of problems listed above:   hypertension.  BP controlled.  Continue HCTZ 12.5 mg daily, check BMP in 3 days.  Has a history of hypokalemia with HCTZ.  Plan to replete potassium if low.  Continue Cardizem SR 120 mg twice daily. paroxysmal SVT, symptoms controlled, continue Cardizem SR 120 mg twice daily. Morbid obesity, graduated exercising, low-calorie diet, weight loss advised  Follow-up in 6 months.  Total encounter time more than 35 minutes  Greater than 50% was spent in counseling and coordination of care with the patient   This note was generated in part or whole with voice recognition software. Voice recognition is usually quite accurate but there are transcription errors that can and very often do occur. I apologize for any typographical errors that were not detected and corrected.  Medication Adjustments/Labs and Tests Ordered: Current medicines are reviewed at length with the patient today.  Concerns regarding medicines are outlined above.  Orders Placed This Encounter  Procedures   Basic metabolic panel   EKG 22-LNLG    No orders of the defined types were placed in this encounter.   Patient Instructions  Medication Instructions:  Your physician recommends that you continue on your current medications as directed. Please refer to the Current Medication list given to you today.  *If you need a refill on your cardiac medications before your next appointment, please call your pharmacy*   Lab Work:  Your physician recommends that you return for lab work in: IN 3 days   - Please go to  the [facility]. You will check in at the front desk to the right as you  walk into the atrium. Valet Parking is offered if needed. - No appointment needed. You may go any day between 7 am and 6 pm.    Testing/Procedures: None ordered   Follow-Up: At [facility], you and your health needs are our priority.  As part of our continuing mission to provide you with exceptional heart care, we have created designated Provider Care Teams.  These Care Teams include your primary Cardiologist (physician) and Advanced Practice Providers (APPs -  Physician Assistants and Nurse Practitioners) who all work together to provide you with the care you need, when you need it.  We recommend signing up for the patient portal called "MyChart".  Sign up information is provided on this After Visit Summary.  MyChart is used to connect with patients for Virtual Visits (Telemedicine).  Patients are able to view lab/test results, encounter notes, upcoming appointments, etc.  Non-urgent messages can be sent to your provider as well.   To learn more about what you can do with MyChart, go to [email or web address].    Your next appointment:   6 month(s)  The format for your next appointment:   In Person  Provider:   [name], MD   Other Instructions    Signed, [name], MD  12/23/2020 12:32 PM    [place]

## 2020-12-23 NOTE — Patient Instructions (Signed)
Medication Instructions:  Your physician recommends that you continue on your current medications as directed. Please refer to the Current Medication list given to you today.  *If you need a refill on your cardiac medications before your next appointment, please call your pharmacy*   Lab Work:  Your physician recommends that you return for lab work in: IN 3 days   - Please go to the [facility]. You will check in at the front desk to the right as you walk into the atrium. Valet Parking is offered if needed. - No appointment needed. You may go any day between 7 am and 6 pm.    Testing/Procedures: None ordered   Follow-Up: At [facility], you and your health needs are our priority.  As part of our continuing mission to provide you with exceptional heart care, we have created designated Provider Care Teams.  These Care Teams include your primary Cardiologist (physician) and Advanced Practice Providers (APPs -  Physician Assistants and Nurse Practitioners) who all work together to provide you with the care you need, when you need it.  We recommend signing up for the patient portal called "MyChart".  Sign up information is provided on this After Visit Summary.  MyChart is used to connect with patients for Virtual Visits (Telemedicine).  Patients are able to view lab/test results, encounter notes, upcoming appointments, etc.  Non-urgent messages can be sent to your provider as well.   To learn more about what you can do with MyChart, go to [email or web address].    Your next appointment:   6 month(s)  The format for your next appointment:   In Person  Provider:   [name], MD   Other Instructions

## 2020-12-27 ENCOUNTER — Ambulatory Visit (INDEPENDENT_AMBULATORY_CARE_PROVIDER_SITE_OTHER): Payer: Medicare Other | Admitting: Pulmonary Disease

## 2020-12-27 ENCOUNTER — Other Ambulatory Visit: Payer: Self-pay

## 2020-12-27 ENCOUNTER — Other Ambulatory Visit
Admission: RE | Admit: 2020-12-27 | Discharge: 2020-12-27 | Disposition: A | Discharge status: Home / Self Care | Payer: Medicare Other | Source: Ambulatory Visit | Attending: Cardiology | Admitting: Cardiology

## 2020-12-27 ENCOUNTER — Encounter: Payer: Self-pay | Admitting: Pulmonary Disease

## 2020-12-27 VITALS — BP 118/74 | HR 62 | Temp 97.7°F | Ht 66.0 in | Wt 267.0 lb

## 2020-12-27 DIAGNOSIS — I471 Supraventricular tachycardia: Secondary | ICD-10-CM

## 2020-12-27 DIAGNOSIS — R911 Solitary pulmonary nodule: Secondary | ICD-10-CM

## 2020-12-27 DIAGNOSIS — K219 Gastro-esophageal reflux disease without esophagitis: Secondary | ICD-10-CM

## 2020-12-27 DIAGNOSIS — R0602 Shortness of breath: Secondary | ICD-10-CM | POA: Diagnosis not present

## 2020-12-27 LAB — BASIC METABOLIC PANEL
Anion gap: 9 (ref 5–15)
BUN: 21 mg/dL (ref 8–23)
CO2: 30 mmol/L (ref 22–32)
Calcium: 9.5 mg/dL (ref 8.9–10.3)
Chloride: 101 mmol/L (ref 98–111)
Creatinine, Ser: 0.55 mg/dL (ref 0.44–1.00)
GFR, Estimated: >60 mL/min (ref >60)
Glucose, Bld: 87 mg/dL (ref 70–99)
Potassium: 3.5 mmol/L (ref 3.5–5.1)
Sodium: 140 mmol/L (ref 135–145)

## 2020-12-27 NOTE — Patient Instructions (Signed)
I recommend that you discontinue ginkgo biloba that can cause a lot of side effects including shortness of breath.  For vitamin supplementation I recommend Benfotiamine 150mg  once to twice daily.  We will order breathing tests and a regular chest CT to evaluate your shortness of breath and your lung nodule.  We will see you in follow-up in 2 to 3 months time call sooner should any new problems arise we will call you the results of your tests as they become available.

## 2020-12-27 NOTE — Progress Notes (Signed)
Subjective:    Patient ID: Kimberly Patton, female    DOB: 01/16/1956, 65 y.o.   MRN: [id number] Chief Complaint  Patient presents with   Consult    Lung nodule   Seen by Dr. Melvyn Novas previously in 2009 and Dr. Gwenette Greet in 2013.  Diagnosed with moderate persistent asthma in the past.  Evaluated for dyspnea in the past.  Prior PFTs (2009 and 2013) consistent with asthma  HPI The patient is a 65 year old non-smoker with very minimal remote past use of cigarettes in childhood, who presents for evaluation of a lung nodule noted on CT of abdomen and pelvis 17 Oct 2020.  She is kindly referred by Dr. [name].  The patient had the CT abdomen and pelvis in question due to abdominal pain which has now resolved.  The patient also notices shortness of breath on occasion though she states that she is fairly at baseline with this.  She first noted shortness of breath around 2008 was evaluated by Dr. Christinia Gully in Home and pulmonary function testing at that time was consistent with moderate persistent asthma.  She was lost to follow-up and then subsequently was evaluated by Dr. Danton Sewer 2013 again, for dyspnea.  PFTs were consistent to the prior and again a diagnosis of moderate persistent asthma was made.  At the time she was on [facility] and doing well with this medication.  Her course has also been complicated by need for laparoscopic hiatal hernia repair in the setting of prior sleeve gastrectomy.  She has had persistent issues with gastroesophageal reflux and surgery has improved on these.  On her CT scan she shows still has a small hiatal hernia.  Prior upper GI staff shown significant reflux though this again is better after her hiatal hernia repair.  She has issues with palpitations, and has had some issues with atypical chest pain that has been worked up.  Her shortness of breath is aggravated by abdominal pain when this occurs.  She has not had any fevers, chills or sweats.  No cough or sputum  production.  She is currently not on any inhalers.  She does get lower extremity edema at times.  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  She has previously resided in [place] and [place].  She is taking significant doses of ginkgo biloba twice a day.  Review of Systems A 10 point review of systems was performed and it is as noted above otherwise negative.  Past Medical History:  Diagnosis Date   ALLERGIC RHINITIS 04/03/2007   Anemia    Anxiety    Asthma    hx of years ago - no longer a problem    ASTHMA, PERSISTENT, MODERATE 04/03/2007   Cancer ([place])    skin    Dyspnea on exertion    a. 11/2007 Echo: EF 60%.   Dysrhythmia    hx of heart arrhythmia 10-15 years ago - followed by DR Lovena Le - not seen in years    GERD (gastroesophageal reflux disease)    Hemophilia carrier    Hypertension    Joint pain    HIPS / LEGS   Knee injury    RT   Meningioma ([place])    Morbid obesity ([place])    Paroxysmal SVT (supraventricular tachycardia) ([place])    a. 11/2011 48h Holter: RSR, rare PVC's, occas PAC's   Retinal tear of right eye 01/2016   Ventral hernia    Past Surgical History:  Procedure Laterality Date   APPENDECTOMY  1996   BREAST CYST EXCISION Right 1994   Benign   CESAREAN SECTION     x2   [place]   EYE SURGERY  01/2016   Repair retinal tear   FOOT SURGERY  1995 / 1996   x2   HERNIA REPAIR     KNEE ARTHROSCOPY W/ MENISCAL REPAIR  07/25/13   right knee Dr. Mardelle Matte   LAPAROSCOPIC GASTRIC SLEEVE RESECTION N/A 06/08/2013   Procedure: LAPAROSCOPIC GASTRIC SLEEVE RESECTION AND EXCISION OF SEBACEUS CYST FROM MID CHEST takedown of incarcerated ventral hernia and primary repair endoscopy;  Surgeon: [name], MD;  [facility];  Service: General;  Laterality: N/A;   LAPAROSCOPIC NISSEN FUNDOPLICATION N/A 10/17/3265   Procedure: LAPAROSCOPIC REPAIR LARGE SYMPTOMATIC HIATAL HERNIA WITH UPPER ENDOSCOPY;  Surgeon:  Johnathan Hausen, MD;  [facility];  Service: General;  Laterality: N/A;   moles  06/2013   removed 2 moles from under arm and  lowback   NECK SURGERY     occipital nerve damage- injections    OVARIAN CYST [place] Right 01/2016   [facility]   SKIN CANCER EXCISION     TONSILLECTOMY     UPPER GI ENDOSCOPY  06/08/2013   Procedure: UPPER GI ENDOSCOPY;  Surgeon: [name], MD;  [facility];  Service: General;;   US ECHOCARDIOGRAPHY  12/2011   WNL - EF 55-60%, mild MR, grade 1 diastolic dysnfiction (mild)   VENTRAL HERNIA REPAIR  2000   Family History  Problem Relation Age of Onset   Breast cancer Mother    Diabetes Mother    Hypertension Mother    Kidney failure Mother    Diabetes Father    Hypertension Father    Diabetes Other    Hypertension Other    Stroke Other    Hemophilia Other    Social History   Tobacco Use   Smoking status: Unknown   Smokeless tobacco: Never   Tobacco comments:    smoke at age [age]  Substance Use Topics   Alcohol use: Not Currently    Alcohol/week: 0.0 standard drinks   Allergies  Allergen Reactions   Wasp Venom Anaphylaxis   Chlorhexidine Gluconate     Itching under breasts   Metronidazole Other (See Comments)    Chest tightness, neck tightness   Prednisone Other (See Comments)    Severe migraine / with taper dose   Sodium Hypochlorite Rash    Liquid clorox bleach   Current Meds  Medication Sig   acetaminophen (TYLENOL) 500 MG tablet Take 1,000 mg by mouth every 6 (six) hours as needed (for pain.).   Calcium Citrate-Vitamin D (CITRACAL + D PO) Take 2 tablets by mouth daily.    Cyanocobalamin (VITAMIN B-12) 5000 MCG SUBL Place 5,000 mcg under the tongue daily.   diltiazem (CARDIZEM SR) 120 MG 12 hr capsule Take 1 capsule (120 mg total) by mouth 2 (two) times daily.   ELDERBERRY PO Take 1,000 mg by mouth daily.    fluticasone (FLONASE) 50 MCG/ACT nasal  spray SPRAY 2 SPRAYS INTO EACH NOSTRIL EVERY DAY   Ginkgo Biloba 40 MG TABS Take 120 mg by mouth 2 (two) times daily.   hydrochlorothiazide (MICROZIDE) 12.5 MG capsule Take 1 capsule (12.5 mg total) by mouth daily.   HYDROcodone-acetaminophen (NORCO/VICODIN) 5-325 MG tablet Take 1 tablet by mouth every 6 (six)  hours as needed for severe pain.   Lactobacillus Rhamnosus, GG, (CULTURELLE IMMUNITY SUPPORT PO) Take by mouth.   Multiple Vitamins-Minerals (CENTRUM SILVER 50+WOMEN PO) Take by mouth.   nitroGLYCERIN (NITROSTAT) 0.4 MG SL tablet Place 0.4 mg under the tongue every 5 (five) minutes as needed for chest pain.   pantoprazole (PROTONIX) 40 MG tablet Take 40 mg by mouth daily.   PREDNISOLONE ACETATE P-F 1 % ophthalmic suspension Place 1 drop into the right eye 3 (three) times daily.   TURMERIC PO Take by mouth.   vitamin C (ASCORBIC ACID) 250 MG tablet Take 250 mg by mouth daily.   Vitamins-Lipotropics (LIPO-FLAVONOID PLUS PO)    Immunization History  Administered Date(s) Administered   Influenza Inj Mdck Quad Pf 02/13/2018   Influenza Whole 06/11/2005, 04/08/2009   Influenza,inj,Quad PF,6+ Mos 03/22/2017, 02/13/2018, 03/03/2019, 04/08/2020   Influenza-Unspecified 03/27/2016   PFIZER(Purple Top)SARS-COV-2 Vaccination 08/07/2019, 08/29/2019   Pneumococcal Polysaccharide-23 03/22/2017   Td 06/11/2004   Tdap 01/20/2015       Objective:   Physical Exam BP 118/74 (BP Location: Left Arm, Patient Position: Sitting, Cuff Size: Normal)   Pulse 62   Temp 97.7 F (36.5 C) (Oral)   Ht 5\' 6"  (1.676 m)   Wt 267 lb (121.1 kg)   SpO2 99%   BMI 43.09 kg/m  GENERAL: Morbidly obese woman, no acute distress, fully ambulatory.  No conversational dyspnea. HEAD: Normocephalic, atraumatic.  EYES: Pupils equal, round, reactive to light.  No scleral icterus.  MOUTH: Nose/mouth/throat not examined due to masking requirements for COVID 19. NECK: Supple. No thyromegaly. Trachea midline. No JVD.  No  adenopathy. PULMONARY: Good air entry bilaterally.  No adventitious sounds. CARDIOVASCULAR: S1 and S2. Regular rate and rhythm.  No rubs, murmurs or gallops heard. ABDOMEN: Obese otherwise benign. MUSCULOSKELETAL: No joint deformity, no clubbing, no edema.  NEUROLOGIC: No focal deficit, no gait disturbance, speech is fluent. SKIN: Intact,warm,dry.  Lipedema of the lower extremities.  Chronic stasis changes. PSYCH: Somewhat anxious mood, normal behavior.  Representative slice of the lower lung zones noted on CT abdomen pelvis 17 Oct 2020:  Nodule between 6 to 7 mm on the right lower lobe.    Assessment & Plan:     ICD-10-CM   1. Incidental lung nodule, > 22mm and < 18mm  R91.1 CT CHEST WO CONTRAST   Needs dedicated CT of the chest to place nodule in context CT chest without contrast    2. Shortness of breath  R06.02 Pulmonary Function Test [facility] Only   Chronic issue since at least 2008 Prior history of moderate persistent asthma Will obtain PFTs    3. Chronic GERD  K21.9    Has had prior hiatal hernia repair Persistent issues though not at same level Been discontinuing ginkgo which can aggravate    4. Morbid obesity ([place])  E66.01    This issue adds complexity to her management Will add to her issues with dyspnea     Orders Placed This Encounter  Procedures   CT CHEST WO CONTRAST    Standing Status:   Future    Standing Expiration Date:   12/27/2021    Order Specific Question:   Preferred imaging location?    Answer:   [facility]   Pulmonary Function Test [facility] Only    Standing Status:   Future    Standing Expiration Date:   12/27/2021    Order Specific Question:   Full PFT: includes the following: basic spirometry, spirometry pre & post  bronchodilator, diffusion capacity (DLCO), lung volumes    Answer:   Full PFT    Order Specific Question:   This test can only be performed at    Answer:   [facility]   Discussion:  Patient has been noted to have a 6 to 7 mm  nodule on the right lower lobe however the imaging was limited to abdomen and pelvis and only lower portions of the lung were visible.  She will need a dedicated CT of the chest to better assess and put the nodule in the context.  Patient has been having issues with shortness of breath since approximately 2008.  Previously diagnosed with moderate persistent asthma but currently not on any inhalers.  Will obtain PFTs to clarify this issue.  Suspect that a lot of her issues with dyspnea are related to morbid obesity and possible obesity with obesity hypoventilation syndrome.  She has been supplementing with ginkgo biloba.  Given her issues with palpitations and gastroesophageal reflux recommend discontinuation of the supplement.  We will see the patient in follow-up in 2 to 3 months time she is to contact us prior to that time should any new difficulties arise.   Renold Don, MD [facility]   *This note was dictated using voice recognition software/Dragon.  Despite best efforts to proofread, errors can occur which can change the meaning.  Any change was purely unintentional.

## 2020-12-28 ENCOUNTER — Encounter: Payer: Self-pay | Admitting: Pulmonary Disease

## 2020-12-29 ENCOUNTER — Telehealth: Payer: Self-pay

## 2020-12-29 DIAGNOSIS — I10 Essential (primary) hypertension: Secondary | ICD-10-CM

## 2020-12-29 MED ORDER — POTASSIUM CHLORIDE ER 10 MEQ PO TBCR: 10.0000 meq | EXTENDED_RELEASE_TABLET | Freq: Every day | ORAL | 3 refills | Status: DC

## 2020-12-29 NOTE — Telephone Encounter (Signed)
-----   Message from [name], MD sent at 12/27/2020  4:22 PM EDT ----- Potassium is low normal.  Start potassium 10 mEq daily, repeat BMP 1 month.

## 2020-12-29 NOTE — Telephone Encounter (Signed)
Called patient and gave her the lab result note as seen below. Patient verbalized understanding and agreed with plan.

## 2020-12-29 NOTE — Addendum Note (Signed)
Addended by: Kavin Leech on: 12/29/2020 03:45 PM   Modules accepted: Orders

## 2021-01-02 ENCOUNTER — Telehealth: Payer: Self-pay

## 2021-01-02 MED ORDER — POTASSIUM CHLORIDE ER 10 MEQ PO CPCR
10.0000 meq | ORAL_CAPSULE | Freq: Every day | ORAL | 3 refills | Status: DC
Start: 2021-01-02 — End: 2021-01-05

## 2021-01-02 NOTE — Telephone Encounter (Signed)
Called patient in reference to her MyChart message as seen below. I advised her that we could try the encapsulated potassium 10 MEQ that is an extended release and see if that works better on her stomach. Patient agreed she would like to try that, and requested that I sent in only 15 tablets first to see if she does okay with that and then she would let us know so we could increase the amount for her refill. Prescription sent in to patients pharmacy as requested.  Did not take my pill today yet......Marland Kitchenwanting to see if that relieve the symptoms  Marjory Sneddon, MD Yesterday (4:13 PM)   The camping and nausea and tight chest has not let up. Blood pressure is ok.   I was reminded by someone that I had a similar reaction when I was given potassium previously.  Marjory Sneddon, MD Yesterday (8:27 AM)   Every day I take my potassium pill, I get stomach cramps, nauseous...then I feel tight in my chest.   I know With my surgeries I have a small stomach.   I've been taking the pill with food.   Should I continue?   Can I cut the pill in half and take half in AM half in PM?   Just trying to think of other options.Marland KitchenMarland KitchenMarland Kitchen

## 2021-01-04 ENCOUNTER — Telehealth: Payer: Self-pay

## 2021-01-05 MED ORDER — IRBESARTAN 75 MG PO TABS: 75.0000 mg | ORAL_TABLET | Freq: Every day | ORAL | 5 refills | Status: DC

## 2021-01-05 NOTE — Telephone Encounter (Signed)
Called patient and informed her of Dr. Thereasa Solo recommendations as seen below.  Patient stated that she would like to try taking her potassium for one more day. If she still feels bad, she will call us back and try this plan. Patient was very grateful for the call.  [name], MD  You 16 hours ago (3:52 PM)   Stop potassium, stop hctz, start irbesartan '75mg'$  qd for BP control. Keep f/u appointment.    Marjory Sneddon, MD 23 hours ago (8:50 AM)   Symptoms on new potassium pill   First day...no problem Second day...Marland KitchenMarland KitchenMarland Kitchenupset stomach, tightness in chest

## 2021-01-05 NOTE — Telephone Encounter (Signed)
[name], MD  Picard-Tagnolli, Coleen, RN 21 hours ago (3:52 PM)     Stop potassium, stop hctz, start irbesartan '75mg'$  qd for BP control. Keep f/u appointment.

## 2021-01-05 NOTE — Telephone Encounter (Signed)
Patient does not wish to continue with potassium and would like to start new plan Kimberly Patton discuss with her Please send in new medication - would need to start tomorrow

## 2021-01-05 NOTE — Addendum Note (Signed)
Addended by: Lamar Laundry on: 01/05/2021 01:34 PM   Modules accepted: Orders

## 2021-01-05 NOTE — Telephone Encounter (Signed)
Patient made aware of Dr. Thereasa Solo response and recommendation. Pt will stop hctz and potassium. Rx for Irbesartan 75 mg daily sent to the patients pharmacy. Patient voiced appreciation for the assistance.

## 2021-01-07 ENCOUNTER — Telehealth: Payer: Self-pay | Admitting: Physician Assistant

## 2021-01-07 NOTE — Telephone Encounter (Signed)
Pt called stating the new BP medication is dropping her BP too low - 85/58. She has stopped HCTZ. She took new 75 mg irbesartan with cardizem today and went to the farmer's market.  She was feeling lightheaded/dizzy but no near-syncope or syncope. I advised hydration today with electrolytes and to monitor her BP tomorrow off of the irbesartan tomorrow. If her BP is consistently above 140/90 then she can take 1/2 of an irbesartan. We discussed ER precautions and she agrees to this plan. I will send a message to Dr. Garen Lah for further advisement next week.   Ledora Bottcher, PA-C 01/07/2021, 9:26 AM [place] [address] [place], [place] [address]

## 2021-01-11 ENCOUNTER — Other Ambulatory Visit: Payer: Self-pay

## 2021-01-11 ENCOUNTER — Ambulatory Visit
Admission: RE | Admit: 2021-01-11 | Discharge: 2021-01-11 | Disposition: A | Discharge status: Home / Self Care | Payer: Medicare Other | Source: Ambulatory Visit | Attending: Pulmonary Disease | Admitting: Pulmonary Disease

## 2021-01-11 DIAGNOSIS — R911 Solitary pulmonary nodule: Secondary | ICD-10-CM | POA: Diagnosis not present

## 2021-01-11 DIAGNOSIS — I700 Atherosclerosis of aorta: Secondary | ICD-10-CM | POA: Diagnosis not present

## 2021-01-16 ENCOUNTER — Other Ambulatory Visit: Payer: Self-pay

## 2021-01-16 ENCOUNTER — Telehealth: Payer: Self-pay

## 2021-01-16 ENCOUNTER — Other Ambulatory Visit
Admission: RE | Admit: 2021-01-16 | Discharge: 2021-01-16 | Disposition: A | Discharge status: Home / Self Care | Payer: Medicare Other | Source: Ambulatory Visit | Attending: Pulmonary Disease | Admitting: Pulmonary Disease

## 2021-01-16 DIAGNOSIS — Z01812 Encounter for preprocedural laboratory examination: Secondary | ICD-10-CM | POA: Diagnosis not present

## 2021-01-16 DIAGNOSIS — Z20822 Contact with and (suspected) exposure to covid-19: Secondary | ICD-10-CM | POA: Diagnosis not present

## 2021-01-16 LAB — SARS CORONAVIRUS 2 (TAT 6-24 HRS): SARS Coronavirus 2: NEGATIVE

## 2021-01-16 NOTE — Telephone Encounter (Signed)
Called and spoke with patient about upcoming covid test, pt had a clear understanding nothing further needed.

## 2021-01-17 ENCOUNTER — Ambulatory Visit: Payer: Medicare Other | Attending: Pulmonary Disease

## 2021-01-17 DIAGNOSIS — R0602 Shortness of breath: Secondary | ICD-10-CM | POA: Insufficient documentation

## 2021-01-17 MED ORDER — ALBUTEROL SULFATE (2.5 MG/3ML) 0.083% IN NEBU
2.5000 mg | INHALATION_SOLUTION | Freq: Once | RESPIRATORY_TRACT | Status: AC
  Administered 2021-01-17: 2.5 mg via RESPIRATORY_TRACT
  Filled 2021-01-17: qty 3

## 2021-01-19 ENCOUNTER — Encounter: Payer: Self-pay | Admitting: Emergency Medicine

## 2021-01-19 ENCOUNTER — Ambulatory Visit
Admission: EM | Admit: 2021-01-19 | Discharge: 2021-01-19 | Disposition: A | Discharge status: Home / Self Care | Payer: Medicare Other | Attending: Emergency Medicine | Admitting: Emergency Medicine

## 2021-01-19 DIAGNOSIS — S81812A Laceration without foreign body, left lower leg, initial encounter: Secondary | ICD-10-CM | POA: Diagnosis not present

## 2021-01-19 DIAGNOSIS — L089 Local infection of the skin and subcutaneous tissue, unspecified: Secondary | ICD-10-CM

## 2021-01-19 MED ORDER — CEFDINIR 300 MG PO CAPS: 300.0000 mg | ORAL_CAPSULE | Freq: Two times a day (BID) | ORAL | 0 refills | Status: AC

## 2021-01-19 NOTE — ED Provider Notes (Signed)
Chief Complaint   Chief Complaint  Patient presents with   Laceration    LLE (x2wks)     Subjective, HPI  FARM GREGORY is a very pleasant 65 y.o. female who presents with LLE laceration that occurred about 2 weeks ago.  Patient states that she has been cleaning the area with peroxide and applying topical Neosporin daily.  Patient reports that the area does not seem to want to heal.  She reports some erythema around the wound today.  She does not report any pain to the area, fever or chills.  History obtained from patient.   Patient's problem list, past medical and social history, medications, and allergies were reviewed by me and updated in Epic.    ROS  See HPI.  Objective   Vitals:   01/19/21 1657 01/19/21 1659  BP: (!) 150/94   Pulse:  91  Resp: 20   Temp: 98.6 F (37 C)   SpO2: 95%     Vital signs and nursing note reviewed.  General: Appears well-developed and well-nourished. No acute distress.  HEENT: Normocephalic, atraumatic, hearing grossly intact. EOMI, no drainage. No rhinorrhea. Moist mucous membranes.  Neck: Normal range of motion, neck is supple.  Cardiovascular: Normal rate.  Pulm/Chest: No respiratory distress.   Musculoskeletal: No joint deformity, normal range of motion.  Skin: Laceration noted to left lower extremity with central eschar.  There is surrounding erythema to the wound.  Area measures approximately 1.5 cm x 1.5 cm.  Data  No results found for any visits on 01/19/21.   Assessment & Plan  Laceration of left lower leg with infection, initial encounter - Plan: cefdinir (OMNICEF) 300 MG capsule  Meds ordered this encounter  Medications   cefdinir (OMNICEF) 300 MG capsule    Sig: Take 1 capsule (300 mg total) by mouth 2 (two) times daily for 7 days.    Dispense:  14 capsule    Refill:  0    Order Specific Question:   Supervising Provider    Answer:   Chase Picket [id number]     65 y.o. female presents with LLE laceration that  occurred about 2 weeks ago.  Patient states that she has been cleaning the area with peroxide and applying topical Neosporin daily.  Patient reports that the area does not seem to want to heal.  She reports some erythema around the wound today.  She does not report any pain to the area, fever or chills.  Chart review completed.  Given symptoms along with assessment findings, likely laceration with infection.  Advised to stop use of peroxide as this does delay wound healing.  Advised use of bacitracin ointment to the area twice daily as directed.  Also Rx'd cefdinir to the patient's preferred pharmacy as she states that she typically does not tolerate antibiotics well given her history of gastric sleeve surgery.  Advised about home treatment and care to include keeping the area clean and dry and applying Telfa nonstick covering to the wound.  Return as needed for any fever, chills, vomiting or worsening symptoms.  Patient verbalized understanding and agreed with plan.  Patient stable upon discharge.  Plan:   Discharge Instructions      Apply topical bacitracin ointment as directed.  Take cefdinir as prescribed.  Keep area clean and dry.  You may apply a covering to the area that is nonstick.  If you begin to have any fever, chills, vomiting or worsening discomfort to the area, please return to clinic  for reevaluation.         Serafina Royals, [place] 01/19/21 1744

## 2021-01-19 NOTE — Discharge Instructions (Addendum)
Apply topical bacitracin ointment as directed.  Take cefdinir as prescribed.  Keep area clean and dry.  You may apply a covering to the area that is nonstick.  If you begin to have any fever, chills, vomiting or worsening discomfort to the area, please return to clinic for reevaluation.

## 2021-01-19 NOTE — ED Triage Notes (Signed)
Pt presents today with c/o of laceration to LLE x 2 weeks. She reports she hit it on a dishwasher and has been treating at home with bandage and topical antibiotics. She noticed redness today.

## 2021-01-24 ENCOUNTER — Telehealth: Payer: Self-pay | Admitting: Pulmonary Disease

## 2021-01-24 NOTE — Telephone Encounter (Signed)
Called and spoke with pt letting her know the results of recent tests and she verbalized understanding. F/u has been made for pt with TP at [place] office. Nothing further needed.

## 2021-01-24 NOTE — Telephone Encounter (Signed)
Patient calling wanting her PFT results, looks like the test has been scanned into the chart, just need to know what to tell the patient.  Patient wants to know if she needs to make an appointment next available is mid October  Dr. Patsey Berthold is out sending to Dr. Mortimer Fries for recommendations.

## 2021-01-24 NOTE — Telephone Encounter (Signed)
Called and spoke with pt and got her scheduled for a f/u with TP at [place] office. Nothing further needed.

## 2021-01-26 ENCOUNTER — Other Ambulatory Visit: Payer: Self-pay

## 2021-01-26 DIAGNOSIS — E7800 Pure hypercholesterolemia, unspecified: Secondary | ICD-10-CM

## 2021-02-08 ENCOUNTER — Telehealth: Payer: Self-pay | Admitting: Cardiology

## 2021-02-08 NOTE — Telephone Encounter (Signed)
Returned call to Pt.  She accidentally took her PM dose of diltiazem at 12:30 pm.  Pt states her BP is running high because she is distressed about her dog having kidney stones.  Advised she would be ok, she can skip tonight's dose of diltiazem.  Pt thanked nurse for call back.

## 2021-02-08 NOTE — Telephone Encounter (Signed)
Patient states she took a Diltiazem at 6:30 am and thought she was taking her calcium pill and took another Diltiazem. Please call to discuss.

## 2021-02-14 ENCOUNTER — Encounter: Payer: Self-pay | Admitting: Adult Health

## 2021-02-14 ENCOUNTER — Ambulatory Visit (INDEPENDENT_AMBULATORY_CARE_PROVIDER_SITE_OTHER): Payer: Medicare Other | Admitting: Adult Health

## 2021-02-14 ENCOUNTER — Other Ambulatory Visit: Payer: Self-pay

## 2021-02-14 DIAGNOSIS — J4520 Mild intermittent asthma, uncomplicated: Secondary | ICD-10-CM | POA: Diagnosis not present

## 2021-02-14 DIAGNOSIS — Z7689 Persons encountering health services in other specified circumstances: Secondary | ICD-10-CM | POA: Diagnosis not present

## 2021-02-14 MED ORDER — QVAR REDIHALER 40 MCG/ACT IN AERB: 2.0000 | INHALATION_SPRAY | Freq: Two times a day (BID) | RESPIRATORY_TRACT | 5 refills | Status: DC

## 2021-02-14 NOTE — Progress Notes (Signed)
$'@Patient'O$  ID: Myles Gip, female    DOB: 1955/08/05, 65 y.o.   MRN: [id number]    Referring provider: Jinny Sanders, MD  HPI: 65 year old female with minimum smoking history seen for pulmonary consult/reestablish for asthma 12/27/20   TEST/EVENTS :   02/14/2021 Follow up : Asthma  Patient returns for a 6-week follow-up.  Patient was seen last visit to reestablish for her asthma.  She had previously been seen years ago in the pulmonary clinic for asthma.  Patient complains of some ongoing shortness of breath decreased activity tolerance.  Minimum dry cough.  She was set up for a CT chest.  This showed clear lungs.  There was a previous nodule in the right lower lobe that had totally resolved. Set up for PFTs done January 17, 2021 similar to previous.  Mild to moderate restriction With bronchodilator response.  FEV1 74%, ratio 77, FVC 74%.  +10% bronchodilator change positive mid flow reversibility.  DLCO 125%. Patient says her main symptom is that she gets out of breath intermittently and has decreased activity tolerance.  Has occasional dry cough. We discussed healthy weight loss.  She is in agreement to a referral to the healthy weight and wellness center.   Allergies  Allergen Reactions   Wasp Venom Anaphylaxis   Chlorhexidine Gluconate     Itching under breasts   Metronidazole Other (See Comments)    Chest tightness, neck tightness   Prednisone Other (See Comments)    Severe migraine / with taper dose   Sodium Hypochlorite Rash    Liquid clorox bleach    Immunization History  Administered Date(s) Administered   Influenza Inj Mdck Quad Pf 02/13/2018   Influenza Whole 06/11/2005, 04/08/2009   Influenza,inj,Quad PF,6+ Mos 03/22/2017, 02/13/2018, 03/03/2019, 04/08/2020   Influenza-Unspecified 03/27/2016   PFIZER(Purple Top)SARS-COV-2 Vaccination 08/07/2019, 08/29/2019   Pneumococcal Polysaccharide-23 03/22/2017   Td 06/11/2004   Tdap 01/20/2015    Past Medical History:   Diagnosis Date   ALLERGIC RHINITIS 04/03/2007   Anemia    Anxiety    Asthma    hx of years ago - no longer a problem    ASTHMA, PERSISTENT, MODERATE 04/03/2007   Cancer ([place])    skin    Dyspnea on exertion    a. 11/2007 Echo: EF 60%.   Dysrhythmia    hx of heart arrhythmia 10-15 years ago - followed by DR Lovena Le - not seen in years    GERD (gastroesophageal reflux disease)    Hemophilia carrier    Hypertension    Joint pain    HIPS / LEGS   Knee injury    RT   Meningioma (HCC)    Morbid obesity (HCC)    Paroxysmal SVT (supraventricular tachycardia) ([place])    a. 11/2011 48h Holter: RSR, rare PVC's, occas PAC's   Retinal tear of right eye 01/2016   Ventral hernia     Tobacco History: Social History   Tobacco Use  Smoking Status Unknown  Smokeless Tobacco Never  Tobacco Comments   smoke at age 40-12   Counseling given: Not Answered Tobacco comments: smoke at age 59-12   Outpatient Medications Prior to Visit  Medication Sig Dispense Refill   aspirin 81 MG chewable tablet Chew by mouth daily.     Benfotiamine 150 MG CAPS Take by mouth.     Calcium Citrate-Vitamin D (CITRACAL + D PO) Take 2 tablets by mouth daily.      Cyanocobalamin (VITAMIN B-12) 5000 MCG SUBL Place 5,000 mcg  under the tongue daily.     diltiazem (CARDIZEM SR) 120 MG 12 hr capsule Take 1 capsule (120 mg total) by mouth 2 (two) times daily. 180 capsule 3   ELDERBERRY PO Take 1,000 mg by mouth daily.      fluticasone (FLONASE) 50 MCG/ACT nasal spray SPRAY 2 SPRAYS INTO EACH NOSTRIL EVERY DAY 48 mL 3   Ginkgo Biloba 40 MG TABS Take 120 mg by mouth 2 (two) times daily.     Lactobacillus Rhamnosus, GG, (CULTURELLE IMMUNITY SUPPORT PO) Take by mouth.     Multiple Vitamins-Minerals (CENTRUM SILVER 50+WOMEN PO) Take by mouth.     nitroGLYCERIN (NITROSTAT) 0.4 MG SL tablet Place 0.4 mg under the tongue every 5 (five) minutes as needed for chest pain.     Omega-3 Fatty Acids (FISH OIL) 1200 MG CAPS Take by  mouth.     pantoprazole (PROTONIX) 40 MG tablet Take 40 mg by mouth daily.     TURMERIC PO Take by mouth.     vitamin C (ASCORBIC ACID) 250 MG tablet Take 250 mg by mouth daily.     acetaminophen (TYLENOL) 500 MG tablet Take 1,000 mg by mouth every 6 (six) hours as needed (for pain.).     HYDROcodone-acetaminophen (NORCO/VICODIN) 5-325 MG tablet Take 1 tablet by mouth every 6 (six) hours as needed for severe pain. 10 tablet 0   irbesartan (AVAPRO) 75 MG tablet Take 1 tablet (75 mg total) by mouth daily. 30 tablet 5   PREDNISOLONE ACETATE P-F 1 % ophthalmic suspension Place 1 drop into the right eye 3 (three) times daily.     Vitamins-Lipotropics (LIPO-FLAVONOID PLUS PO)      Facility-Administered Medications Prior to Visit  Medication Dose Route Frequency Provider Last Rate Last Admin   methylPREDNISolone acetate (DEPO-MEDROL) injection 80 mg  80 mg Intramuscular Once Jearld Fenton, NP         Review of Systems:   Constitutional:   No  weight loss, night sweats,  Fevers, chills, + fatigue, or  lassitude.  HEENT:   No headaches,  Difficulty swallowing,  Tooth/dental problems, or  Sore throat,                No sneezing, itching, ear ache, nasal congestion, post nasal drip,   CV:  No chest pain,  Orthopnea, PND, swelling in lower extremities, anasarca, dizziness, palpitations, syncope.   GI  No heartburn, indigestion, abdominal pain, nausea, vomiting, diarrhea, change in bowel habits, loss of appetite, bloody stools.   Resp: .  No chest wall deformity  Skin: no rash or lesions.  GU: no dysuria, change in color of urine, no urgency or frequency.  No flank pain, no hematuria   MS:  No joint pain or swelling.  No decreased range of motion.  No back pain.    Physical Exam  BP 120/74 (BP Location: Left Arm, Patient Position: Sitting, Cuff Size: Normal)   Pulse 73   Temp 98 F (36.7 C) (Oral)   Ht '5\' 6"'$  (1.676 m)   Wt 272 lb 12.8 oz (123.7 kg)   SpO2 97%   BMI 44.03 kg/m    GEN: A/Ox3; pleasant , NAD, BMI 44    HEENT:  [place]/AT,  , NOSE-clear, THROAT-clear, no lesions, no postnasal drip or exudate noted.   NECK:  Supple w/ fair ROM; no JVD; normal carotid impulses w/o bruits; no thyromegaly or nodules palpated; no lymphadenopathy.    RESP  Clear  P & A; w/o, wheezes/  rales/ or rhonchi. no accessory muscle use, no dullness to percussion  CARD:  RRR, no m/r/g, no peripheral edema, pulses intact, no cyanosis or clubbing.  GI:   Soft & nt; nml bowel sounds; no organomegaly or masses detected.   Musco: Warm bil, no deformities or joint swelling noted.   Neuro: alert, no focal deficits noted.    Skin: Warm, no lesions or rashes    Lab Results:  CBC    Component Value Date/Time   WBC 6.3 10/17/2020 1253   RBC 4.74 10/17/2020 1253   HGB 13.1 10/17/2020 1253   HCT 40.9 10/17/2020 1253   PLT 293 10/17/2020 1253   MCV 86.3 10/17/2020 1253   MCH 27.6 10/17/2020 1253   MCHC 32.0 10/17/2020 1253   RDW 13.9 10/17/2020 1253   LYMPHSABS 1.8 10/17/2020 1253   MONOABS 0.3 10/17/2020 1253   EOSABS 0.1 10/17/2020 1253   BASOSABS 0.0 10/17/2020 1253    BMET    Component Value Date/Time   NA 140 12/27/2020 0759   NA 139 01/21/2020 1137   K 3.5 12/27/2020 0759   CL 101 12/27/2020 0759   CO2 30 12/27/2020 0759   GLUCOSE 87 12/27/2020 0759   BUN 21 12/27/2020 0759   BUN 16 01/21/2020 1137   CREATININE 0.55 12/27/2020 0759   CREATININE 0.57 01/01/2020 1623   CALCIUM 9.5 12/27/2020 0759   GFRNONAA >60 12/27/2020 0759   GFRAA >60 02/02/2020 1417    BNP No results found for: BNP  ProBNP    Component Value Date/Time   PROBNP 40.0 12/07/2011 1053    Imaging: Pulmonary Function Test [facility] Only  Result Date: 01/17/2021 Spirometry Data Is Acceptable and Reproducible Moderate Obstructive Airways Disease with Significant Broncho-Dilator Response +air trapping (increased RV) and +Hyperinflation MILD/Moderate Restrictive Lung disease Consider outpatient  Pulmonary Consultation if needed Clinical Correlation Advised    albuterol (PROVENTIL) (2.5 MG/3ML) 0.083% nebulizer solution 2.5 mg     Date Action Dose Route User   Discharged on 01/19/2021   Admitted on 01/19/2021   01/17/2021 0831 Given 2.5 mg Nebulization [place], Amy, RRT       No flowsheet data found.  No results found for: NITRICOXIDE      Assessment & Plan:   No problem-specific Assessment & Plan notes found for this encounter.     Rexene Edison, NP 02/14/2021

## 2021-02-14 NOTE — Patient Instructions (Signed)
Begin QVAR 40 2 puffs Twice daily , Rinse after use.  Albuterol inhaler As needed   Activity as tolerated.  Refer to Healthy weight and wellness.  Follow up with Dr. Patsey Berthold in 3-4 months and As needed

## 2021-02-17 NOTE — Assessment & Plan Note (Signed)
Healthy weight loss discussed.  Referral to the healthy weight and wellness clinic.

## 2021-02-17 NOTE — Assessment & Plan Note (Signed)
Asthma with mild persistent symptoms.  We discussed treatment options feel that patient would benefit from addition of an ICS.  PFT shows some mild to moderate restriction that appears to be stable since previous PFTs in 2013.  Plan  Patient Instructions  Begin QVAR 40 2 puffs Twice daily , Rinse after use.  Albuterol inhaler As needed   Activity as tolerated.  Refer to Healthy weight and wellness.  Follow up with Dr. Patsey Berthold in 3-4 months and As needed

## 2021-02-20 NOTE — Progress Notes (Signed)
Agree with the details of the visit as noted by Tammy Parrett, NP.  C. Laura Alianna Wurster, MD [facility] 

## 2021-02-24 ENCOUNTER — Telehealth: Payer: Self-pay

## 2021-02-24 ENCOUNTER — Other Ambulatory Visit
Admission: RE | Admit: 2021-02-24 | Discharge: 2021-02-24 | Disposition: A | Discharge status: Home / Self Care | Payer: Medicare Other | Attending: Cardiology | Admitting: Cardiology

## 2021-02-24 DIAGNOSIS — E7800 Pure hypercholesterolemia, unspecified: Secondary | ICD-10-CM | POA: Insufficient documentation

## 2021-02-24 DIAGNOSIS — I10 Essential (primary) hypertension: Secondary | ICD-10-CM | POA: Diagnosis not present

## 2021-02-24 LAB — BASIC METABOLIC PANEL
Anion gap: 8 (ref 5–15)
BUN: 22 mg/dL (ref 8–23)
CO2: 29 mmol/L (ref 22–32)
Calcium: 9.5 mg/dL (ref 8.9–10.3)
Chloride: 103 mmol/L (ref 98–111)
Creatinine, Ser: 0.46 mg/dL (ref 0.44–1.00)
GFR, Estimated: >60 mL/min (ref >60)
Glucose, Bld: 96 mg/dL (ref 70–99)
Potassium: 4.2 mmol/L (ref 3.5–5.1)
Sodium: 140 mmol/L (ref 135–145)

## 2021-02-24 LAB — LIPID PANEL
Cholesterol: 222 mg/dL — ABNORMAL HIGH (ref 0–200)
HDL: 77 mg/dL (ref >40)
LDL Cholesterol: 132 mg/dL — ABNORMAL HIGH (ref 0–99)
Total CHOL/HDL Ratio: 2.9 RATIO
Triglycerides: 65 mg/dL (ref <150)
VLDL: 13 mg/dL (ref 0–40)

## 2021-02-24 MED ORDER — ATORVASTATIN CALCIUM 10 MG PO TABS: 10.0000 mg | ORAL_TABLET | Freq: Every day | ORAL | 0 refills | Status: DC

## 2021-02-24 NOTE — Telephone Encounter (Signed)
-----   Message from [name], MD sent at 02/24/2021  1:48 PM EDT ----- Cholesterol more elevated compared to prior.  Recommend low-cholesterol diet.  Recommend starting Lipitor 40 mg daily if patient is agreeable.

## 2021-02-24 NOTE — Telephone Encounter (Signed)
Spoke to patient about starting the Lipitor 40 MG. Patient is very nervous about trying a statin as 2 of her sisters have had really bad reactions to statins. Patient would like to try a low dose first for a couple of weeks, and if she tolerates it would then be willing to titrate up to 40 MG daily. Sent in the prescription for Lipitor 10 MG. Patient will let me know how she is doing after starting, and if she will continue with the therapy.  We also discussed diet and exercise and patient stated she plans to incorporate both lifestyle changes into her daily life as well.

## 2021-02-27 ENCOUNTER — Other Ambulatory Visit: Payer: Self-pay | Admitting: Family Medicine

## 2021-02-27 DIAGNOSIS — Z1231 Encounter for screening mammogram for malignant neoplasm of breast: Secondary | ICD-10-CM

## 2021-02-27 NOTE — Telephone Encounter (Signed)
Please advise about labs.  Patient has BMET and Lipid with cardiologist last week.

## 2021-03-03 ENCOUNTER — Other Ambulatory Visit: Payer: Self-pay | Admitting: Cardiology

## 2021-03-09 ENCOUNTER — Other Ambulatory Visit: Payer: Self-pay

## 2021-03-09 ENCOUNTER — Encounter: Payer: Self-pay | Admitting: Family Medicine

## 2021-03-09 ENCOUNTER — Other Ambulatory Visit (HOSPITAL_COMMUNITY)
Admission: RE | Admit: 2021-03-09 | Discharge: 2021-03-09 | Disposition: A | Discharge status: Home / Self Care | Payer: Medicare Other | Source: Ambulatory Visit | Attending: Family Medicine | Admitting: Family Medicine

## 2021-03-09 ENCOUNTER — Ambulatory Visit (INDEPENDENT_AMBULATORY_CARE_PROVIDER_SITE_OTHER): Payer: Medicare Other | Admitting: Family Medicine

## 2021-03-09 VITALS — BP 130/82 | HR 61 | Temp 97.4°F | Ht 65.5 in | Wt 270.3 lb

## 2021-03-09 DIAGNOSIS — I10 Essential (primary) hypertension: Secondary | ICD-10-CM | POA: Diagnosis not present

## 2021-03-09 DIAGNOSIS — Z124 Encounter for screening for malignant neoplasm of cervix: Secondary | ICD-10-CM | POA: Insufficient documentation

## 2021-03-09 DIAGNOSIS — E7800 Pure hypercholesterolemia, unspecified: Secondary | ICD-10-CM | POA: Diagnosis not present

## 2021-03-09 DIAGNOSIS — Z01419 Encounter for gynecological examination (general) (routine) without abnormal findings: Secondary | ICD-10-CM | POA: Insufficient documentation

## 2021-03-09 DIAGNOSIS — Z1151 Encounter for screening for human papillomavirus (HPV): Secondary | ICD-10-CM | POA: Diagnosis not present

## 2021-03-09 DIAGNOSIS — D329 Benign neoplasm of meninges, unspecified: Secondary | ICD-10-CM

## 2021-03-09 DIAGNOSIS — Z0000 Encounter for general adult medical examination without abnormal findings: Secondary | ICD-10-CM

## 2021-03-09 DIAGNOSIS — M810 Age-related osteoporosis without current pathological fracture: Secondary | ICD-10-CM

## 2021-03-09 DIAGNOSIS — J4520 Mild intermittent asthma, uncomplicated: Secondary | ICD-10-CM | POA: Diagnosis not present

## 2021-03-09 DIAGNOSIS — Z23 Encounter for immunization: Secondary | ICD-10-CM | POA: Diagnosis not present

## 2021-03-09 DIAGNOSIS — Z1212 Encounter for screening for malignant neoplasm of rectum: Secondary | ICD-10-CM

## 2021-03-09 DIAGNOSIS — Z1211 Encounter for screening for malignant neoplasm of colon: Secondary | ICD-10-CM

## 2021-03-09 NOTE — Assessment & Plan Note (Addendum)
Improving, with GO LO diet plan.  Add regular exercise.

## 2021-03-09 NOTE — Assessment & Plan Note (Signed)
Stable, chronic.  Continue current medication.    diltiazem S SR 120 mg BID,

## 2021-03-09 NOTE — Assessment & Plan Note (Signed)
Stable control, followed by pulmonary.

## 2021-03-09 NOTE — Patient Instructions (Addendum)
Use Co Q10 for myalgias from atorvastatin. Work on increasing exercise as able.  Try to wean off pantoprazole over time.  Call to make bone density appt in 08/2021

## 2021-03-09 NOTE — Progress Notes (Signed)
Patient ID: Kimberly Patton, female    DOB: 1955/09/25, 65 y.o.   MRN: [id number]  This visit was conducted in person.  BP 130/82   Pulse 61   Temp (!) 97.4 F (36.3 C) (Temporal)   Ht 5' 5.5" (1.664 m)   Wt 270 lb 5 oz (122.6 kg)   SpO2 99%   BMI 44.30 kg/m    CC: Chief Complaint  Patient presents with   welcome to Medicare     Last EKG 12/23/20   Gynecologic Exam    Subjective:   HPI: Kimberly Patton is a 65 y.o. female presenting on 03/09/2021 for welcome to Medicare  (Last EKG 12/23/20) and Gynecologic Exam  The patient presents for annual medicare wellness, complete physical and review of chronic health problems. He/She also has the following acute concerns today: none  I have personally reviewed the Medicare Annual Wellness questionnaire and have noted 1. The patient's medical and social history 2. Their use of alcohol, tobacco or illicit drugs 3. Their current medications and supplements 4. The patient's functional ability including ADL's, fall risks, home safety risks and hearing or visual             impairment. 5. Diet and physical activities 6. Evidence for depression or mood disorders 7.         Updated provider list Cognitive evaluation was performed and recorded on pt medicare questionnaire form. The patients weight, height, BMI and visual acuity have been recorded in the chart   I have made referrals, counseling and provided education to the patient based review of the above and I have provided the pt with a written personalized care plan for preventive services.   Documentation of this information was scanned into the electronic record under the media tab.   Advance directives and end of life planning reviewed in detail with patient and documented in EMR. Patient given handout on advance care directives if needed. HCPOA and living will updated if needed.  Flowsheet Row Nutrition from 02/17/2020 in [facility]  PHQ-2 Total Score 1        Fall Risk  03/09/2021 02/17/2020  Falls in the past year? 1 0  Number falls in past yr: 1 -  Injury with Fall? 1 -   Hearing Screening   250Hz  500Hz  1000Hz  2000Hz  4000Hz   Right ear 20 20 20 20  0  Left ear 20 20 20 20 20   Vision Screening - Comments:: Eye exam scheduled for 03/13/21   Hypertension:  Stable control on diltiazem S SR 120 mg BID,  BP Readings from Last 3 Encounters:  03/09/21 130/82  02/14/21 120/74  01/19/21 (!) 150/94  Using medication without problems or lightheadedness:  none Chest pain with exertion:none Edema:none Short of breath: none Average home BPs: Other issues:  Elevated Cholesterol:  LDL not at goal < 100 ... now on in last week atorvastatin 10 mg daily, with plan to increase. Lab Results  Component Value Date   CHOL 222 (H) 02/24/2021   HDL 77 02/24/2021   LDLCALC 132 (H) 02/24/2021   TRIG 65 02/24/2021   CHOLHDL 2.9 02/24/2021   The 10-year ASCVD risk score (Arnett DK, et al., 2019) is: 7.1%   Values used to calculate the score:     Age: 4 years     Sex: Female     Is Non-Hispanic African American: No     Diabetic: No     Tobacco smoker: No  Systolic Blood Pressure: 878 mmHg     Is BP treated: Yes     HDL Cholesterol: 77 mg/dL     Total Cholesterol: 222 mg/dL Using medications without problems: Muscle aches:  Diet compliance: Go Lo Exercise:  Walking some Other complaints:   Mild intermittent asthma Followed by Pulmonary Dr. Duwayne Heck.  Tinnitus possibly secondary to COVID.Marland Kitchen has improved with B1 vitamin.   Hx of meningioma  Chronic cervical neck pain...  evaluation in process.Marland Kitchen looking for neurologist that covers worker comp. Doing PT for shoulder.  Gabapentin has helped with sensation issue in left arm. Cause mild lightheadedness.   Morbid obesity  Has lost 8 lbs in last 2 week per pt with GO LO diet.  She is on waiting list for healthy weight and wellness. Wt Readings from Last 3 Encounters:  03/09/21 270 lb 5 oz (122.6  kg)  02/14/21 272 lb 12.8 oz (123.7 kg)  12/27/20 267 lb (121.1 kg)   Body mass index is 44.3 kg/m.     Relevant past medical, surgical, family and social history reviewed and updated as indicated. Interim medical history since our last visit reviewed. Allergies and medications reviewed and updated. Outpatient Medications Prior to Visit  Medication Sig Dispense Refill   aspirin 81 MG chewable tablet Chew by mouth daily.     atorvastatin (LIPITOR) 10 MG tablet Take 1 tablet (10 mg total) by mouth daily. 30 tablet 0   beclomethasone (QVAR REDIHALER) 40 MCG/ACT inhaler Inhale 2 puffs into the lungs 2 (two) times daily. 1 each 5   Benfotiamine 150 MG CAPS Take by mouth.     Calcium Citrate-Vitamin D (CITRACAL + D PO) Take 2 tablets by mouth daily.      Cyanocobalamin (VITAMIN B-12) 5000 MCG SUBL Place 5,000 mcg under the tongue daily.     diltiazem (CARDIZEM SR) 120 MG 12 hr capsule Take 1 capsule (120 mg total) by mouth 2 (two) times daily. Please make yearly appt with Dr. Quentin Ore for November 2022 for future refills. Thank you 1st attempt 180 capsule 0   ELDERBERRY PO Take 1,000 mg by mouth daily.      fluticasone (FLONASE) 50 MCG/ACT nasal spray SPRAY 2 SPRAYS INTO EACH NOSTRIL EVERY DAY 48 mL 3   gabapentin (NEURONTIN) 300 MG capsule Take 300 mg by mouth 2 (two) times daily.     Lactobacillus Rhamnosus, GG, (CULTURELLE IMMUNITY SUPPORT PO) Take by mouth.     Multiple Vitamins-Minerals (CENTRUM SILVER 50+WOMEN PO) Take by mouth.     nitroGLYCERIN (NITROSTAT) 0.4 MG SL tablet Place 0.4 mg under the tongue every 5 (five) minutes as needed for chest pain.     Omega-3 Fatty Acids (FISH OIL) 1200 MG CAPS Take by mouth.     pantoprazole (PROTONIX) 40 MG tablet Take 40 mg by mouth daily.     TURMERIC PO Take by mouth.     UBIQUINOL PO Take 100 mg by mouth daily at 12 noon.     vitamin C (ASCORBIC ACID) 250 MG tablet Take 250 mg by mouth daily.     Facility-Administered Medications Prior to  Visit  Medication Dose Route Frequency Provider Last Rate Last Admin   methylPREDNISolone acetate (DEPO-MEDROL) injection 80 mg  80 mg Intramuscular Once Jearld Fenton, NP         Per HPI unless specifically indicated in ROS section below Review of Systems  Constitutional:  Negative for fatigue and fever.  HENT:  Negative for congestion.   Eyes:  Negative for pain.  Respiratory:  Negative for cough and shortness of breath.   Cardiovascular:  Negative for chest pain, palpitations and leg swelling.  Gastrointestinal:  Negative for abdominal pain.  Genitourinary:  Negative for dysuria and vaginal bleeding.  Musculoskeletal:  Negative for back pain.  Neurological:  Negative for syncope, light-headedness and headaches.  Psychiatric/Behavioral:  Negative for dysphoric mood.   Objective:  BP 130/82   Pulse 61   Temp (!) 97.4 F (36.3 C) (Temporal)   Ht 5' 5.5" (1.664 m)   Wt 270 lb 5 oz (122.6 kg)   SpO2 99%   BMI 44.30 kg/m   Wt Readings from Last 3 Encounters:  03/09/21 270 lb 5 oz (122.6 kg)  02/14/21 272 lb 12.8 oz (123.7 kg)  12/27/20 267 lb (121.1 kg)      Physical Exam Vitals and nursing note reviewed.  Constitutional:      General: She is not in acute distress.    Appearance: Normal appearance. She is well-developed. She is obese. She is not ill-appearing or toxic-appearing.  HENT:     Head: Normocephalic.     Right Ear: Hearing, tympanic membrane, ear canal and external ear normal.     Left Ear: Hearing, tympanic membrane, ear canal and external ear normal.     Nose: Nose normal.  Eyes:     General: Lids are normal. Lids are everted, no foreign bodies appreciated.     Conjunctiva/sclera: Conjunctivae normal.     Pupils: Pupils are equal, round, and reactive to light.  Neck:     Thyroid: No thyroid mass or thyromegaly.     Vascular: No carotid bruit.     Trachea: Trachea normal.  Cardiovascular:     Rate and Rhythm: Normal rate and regular rhythm.     Heart  sounds: Normal heart sounds, S1 normal and S2 normal. No murmur heard.   No gallop.  Pulmonary:     Effort: Pulmonary effort is normal. No respiratory distress.     Breath sounds: Normal breath sounds. No wheezing, rhonchi or rales.  Abdominal:     General: Bowel sounds are normal. There is no distension or abdominal bruit.     Palpations: Abdomen is soft. There is no fluid wave or mass.     Tenderness: There is no abdominal tenderness. There is no guarding or rebound.     Hernia: No hernia is present.  Genitourinary:    Exam position: Supine.     Labia:        Right: No rash, tenderness or lesion.        Left: No rash, tenderness or lesion.      Vagina: Normal.     Cervix: No cervical motion tenderness, discharge or friability.     Uterus: Not enlarged and not tender.      Adnexa:        Right: No mass, tenderness or fullness.         Left: No mass, tenderness or fullness.    Musculoskeletal:     Cervical back: Normal range of motion and neck supple.  Lymphadenopathy:     Cervical: No cervical adenopathy.  Skin:    General: Skin is warm and dry.     Findings: No rash.  Neurological:     Mental Status: She is alert.     Cranial Nerves: No cranial nerve deficit.     Sensory: No sensory deficit.  Psychiatric:        Mood and Affect:  Mood is not anxious or depressed.        Speech: Speech normal.        Behavior: Behavior normal. Behavior is cooperative.        Judgment: Judgment normal.      Results for orders placed or performed during the hospital encounter of 02/24/21  Lipid panel  Result Value Ref Range   Cholesterol 222 (H) 0 - 200 mg/dL   Triglycerides 65 <150 mg/dL   HDL 77 >40 mg/dL   Total CHOL/HDL Ratio 2.9 RATIO   VLDL 13 0 - 40 mg/dL   LDL Cholesterol 132 (H) 0 - 99 mg/dL  Basic metabolic panel  Result Value Ref Range   Sodium 140 135 - 145 mmol/L   Potassium 4.2 3.5 - 5.1 mmol/L   Chloride 103 98 - 111 mmol/L   CO2 29 22 - 32 mmol/L   Glucose, Bld 96  70 - 99 mg/dL   BUN 22 8 - 23 mg/dL   Creatinine, Ser 0.46 0.44 - 1.00 mg/dL   Calcium 9.5 8.9 - 10.3 mg/dL   GFR, Estimated >60 >60 mL/min   Anion gap 8 5 - 15    This visit occurred during the SARS-CoV-2 public health emergency.  Safety protocols were in place, including screening questions prior to the visit, additional usage of staff PPE, and extensive cleaning of exam room while observing appropriate contact time as indicated for disinfecting solutions.   COVID 19 screen:  No recent travel or known exposure to COVID19 The patient denies respiratory symptoms of COVID 19 at this time. The importance of social distancing was discussed today.   Assessment and Plan   The patient's preventative maintenance and recommended screening tests for an annual wellness exam were reviewed in full today. Brought up to date unless services declined.  Counselled on the importance of diet, exercise, and its role in overall health and mortality. The patient's FH and SH was reviewed, including their home life, tobacco status, and drug and alcohol status.   Vaccines:Given high dose flu , Td uptodate. Due for shingles vaccine. S/P  COVID vaccine x 2 Pap/DVE:  2016  neg pap, neg HPV.Marland Kitchen Repeat  DUE TODAY, no family history of endometrial/ovarian cancer.Marland Kitchen No DVE. Mammo: 04/08/2020 Bone Density:  osteoporosis 2018 contraindications for bisphosphonate, 08/2019 worsened control on PPI Colon: ifob neg 2020,  set up Cologuard Smoking Status:nonsmoker ETOH/ drug use:  Rare wine/no drugs  Hep C: done  HIV screen:  done  Problem List Items Addressed This Visit     Asthma, mild intermittent    Stable control, followed by pulmonary.      HTN (hypertension)    Stable, chronic.  Continue current medication.    diltiazem S SR 120 mg BID,      Hypercholesteremia    Chronic, not at goal... now on atorvastatin 10 mg daily.  Can use CoQ10 for myalgia.  Cardiology plans to increase dose ans recheck in 3  months.      Meningioma (HCC)   Morbid obesity ([place])     Improving, with GO LO diet plan.  Add regular exercise.      Osteoporosis   Relevant Orders   DG Bone Density   Other Visit Diagnoses     Welcome to Medicare preventive visit    -  Primary   Need for influenza vaccination       Relevant Orders   Flu Vaccine QUAD High Dose(Fluad)        Ohana Birdwell  Diona Browner, MD

## 2021-03-09 NOTE — Assessment & Plan Note (Signed)
Chronic, not at goal... now on atorvastatin 10 mg daily.  Can use CoQ10 for myalgia.  Cardiology plans to increase dose ans recheck in 3 months.

## 2021-03-13 ENCOUNTER — Encounter: Payer: Self-pay | Admitting: Family Medicine

## 2021-03-13 ENCOUNTER — Other Ambulatory Visit: Payer: Self-pay

## 2021-03-13 DIAGNOSIS — H43813 Vitreous degeneration, bilateral: Secondary | ICD-10-CM | POA: Diagnosis not present

## 2021-03-13 DIAGNOSIS — N952 Postmenopausal atrophic vaginitis: Secondary | ICD-10-CM | POA: Insufficient documentation

## 2021-03-13 DIAGNOSIS — H20021 Recurrent acute iridocyclitis, right eye: Secondary | ICD-10-CM | POA: Diagnosis not present

## 2021-03-13 LAB — CYTOLOGY - PAP
Comment: NEGATIVE
Diagnosis: NEGATIVE
High risk HPV: NEGATIVE

## 2021-03-13 MED ORDER — ATORVASTATIN CALCIUM 20 MG PO TABS: 10.0000 mg | ORAL_TABLET | Freq: Every day | ORAL | 0 refills | Status: DC

## 2021-03-13 NOTE — Progress Notes (Signed)
Increased patients Lipitor to 20 MG as she stated she has tolerated the 10 MG dose per her MyChart message. See tele encounter from 02/24/21 for the plan to titrate patient up to Lipitor 40 MG per her request.

## 2021-03-22 DIAGNOSIS — Z1212 Encounter for screening for malignant neoplasm of rectum: Secondary | ICD-10-CM | POA: Diagnosis not present

## 2021-03-22 DIAGNOSIS — Z1211 Encounter for screening for malignant neoplasm of colon: Secondary | ICD-10-CM | POA: Diagnosis not present

## 2021-03-23 ENCOUNTER — Telehealth: Payer: Self-pay

## 2021-03-23 NOTE — Telephone Encounter (Signed)
Patient returned call stating she discussed increasing her dose to 20mg  of atorvastatin daily and she has been doing well on it. She does not need a prescription refill at this time.

## 2021-03-23 NOTE — Telephone Encounter (Signed)
Received refill request from [facility] requesting refill of atorvastatin 10mg  tablets. Patient was doing a trial of this medication with the intention of increasing the dosage if tolerated. Left message for patient requesting for her to call back to see if medication needs refills or if dose can be titrated up as planned.

## 2021-03-27 DIAGNOSIS — H20021 Recurrent acute iridocyclitis, right eye: Secondary | ICD-10-CM | POA: Diagnosis not present

## 2021-03-28 ENCOUNTER — Other Ambulatory Visit: Payer: Self-pay

## 2021-03-28 MED ORDER — ATORVASTATIN CALCIUM 20 MG PO TABS: 20.0000 mg | ORAL_TABLET | Freq: Every day | ORAL | 5 refills | Status: DC

## 2021-03-29 ENCOUNTER — Telehealth (INDEPENDENT_AMBULATORY_CARE_PROVIDER_SITE_OTHER): Payer: Self-pay | Admitting: Nurse Practitioner

## 2021-03-29 DIAGNOSIS — L821 Other seborrheic keratosis: Secondary | ICD-10-CM | POA: Diagnosis not present

## 2021-03-29 DIAGNOSIS — D485 Neoplasm of uncertain behavior of skin: Secondary | ICD-10-CM | POA: Diagnosis not present

## 2021-03-29 DIAGNOSIS — D225 Melanocytic nevi of trunk: Secondary | ICD-10-CM | POA: Diagnosis not present

## 2021-03-29 DIAGNOSIS — D2261 Melanocytic nevi of right upper limb, including shoulder: Secondary | ICD-10-CM | POA: Diagnosis not present

## 2021-03-29 LAB — COLOGUARD: Cologuard: POSITIVE — AB

## 2021-03-29 NOTE — Telephone Encounter (Signed)
She could come in with a reflux study but generally lymphedema doesn't affect the thigh/back of leg.  Therefore, it may not be a vascular issue.  I would strongly recommend she follow up with her PCP as well

## 2021-03-29 NOTE — Telephone Encounter (Signed)
Called and scheduled patient, made her aware of NP's note.

## 2021-03-29 NOTE — Telephone Encounter (Signed)
Called stating that lle has been giving her issues (back of leg/thigh). Patient states she has been wearing compression socks and was denied lymph pump (switched insurance) but would like to come in to be evaluated. Patient was last seen 05/2020. Please advise.

## 2021-03-30 ENCOUNTER — Other Ambulatory Visit: Payer: Self-pay | Admitting: Family Medicine

## 2021-03-30 ENCOUNTER — Telehealth: Payer: Self-pay | Admitting: Gastroenterology

## 2021-03-30 DIAGNOSIS — R195 Other fecal abnormalities: Secondary | ICD-10-CM

## 2021-03-30 NOTE — Telephone Encounter (Signed)
Good afternoon Dr. Silverio Decamp, we received a referral for patient to have a colonoscopy.  She was seen by an APP where you were supervising MD.  She then went to [facility] but is wanting to transfer back to [place].  Records are in Epic.  Can you please review and advise on scheduling?  Thank you.

## 2021-04-05 ENCOUNTER — Other Ambulatory Visit (INDEPENDENT_AMBULATORY_CARE_PROVIDER_SITE_OTHER): Payer: Self-pay | Admitting: Nurse Practitioner

## 2021-04-05 DIAGNOSIS — I890 Lymphedema, not elsewhere classified: Secondary | ICD-10-CM

## 2021-04-05 DIAGNOSIS — M79605 Pain in left leg: Secondary | ICD-10-CM

## 2021-04-06 ENCOUNTER — Ambulatory Visit (INDEPENDENT_AMBULATORY_CARE_PROVIDER_SITE_OTHER): Payer: Medicare Other | Admitting: Vascular Surgery

## 2021-04-06 ENCOUNTER — Other Ambulatory Visit: Payer: Self-pay

## 2021-04-06 ENCOUNTER — Ambulatory Visit (INDEPENDENT_AMBULATORY_CARE_PROVIDER_SITE_OTHER): Payer: Medicare Other

## 2021-04-06 ENCOUNTER — Encounter (INDEPENDENT_AMBULATORY_CARE_PROVIDER_SITE_OTHER): Payer: Self-pay | Admitting: Vascular Surgery

## 2021-04-06 VITALS — BP 125/80 | HR 67 | Ht 66.0 in | Wt 271.0 lb

## 2021-04-06 DIAGNOSIS — I872 Venous insufficiency (chronic) (peripheral): Secondary | ICD-10-CM | POA: Diagnosis not present

## 2021-04-06 DIAGNOSIS — I890 Lymphedema, not elsewhere classified: Secondary | ICD-10-CM | POA: Diagnosis not present

## 2021-04-06 DIAGNOSIS — I25118 Atherosclerotic heart disease of native coronary artery with other forms of angina pectoris: Secondary | ICD-10-CM

## 2021-04-06 DIAGNOSIS — I10 Essential (primary) hypertension: Secondary | ICD-10-CM | POA: Diagnosis not present

## 2021-04-06 DIAGNOSIS — E7800 Pure hypercholesterolemia, unspecified: Secondary | ICD-10-CM

## 2021-04-06 DIAGNOSIS — M79605 Pain in left leg: Secondary | ICD-10-CM | POA: Diagnosis not present

## 2021-04-06 NOTE — Progress Notes (Signed)
MRN : [id number]  LEGNA MAUSOLF is a 65 y.o. (1955/12/06) female who presents with chief complaint of left leg swelling is much worse.  History of Present Illness:   The patient returns to the office for followup evaluation regarding leg swelling.  The swelling has persisted and the pain associated with swelling continues. There have not been any interval development of a ulcerations or wounds.  Since the previous visit the patient has been wearing graduated compression stockings and has noted little if any improvement in the lymphedema. The patient has been using compression routinely morning until night.  The patient also states elevation during the day and exercise is being done too.   Venous duplex shows severe reflux through out the entire left GSV (previous venous duplex showed this as well as the right GSV)  Current Meds  Medication Sig   aspirin 81 MG chewable tablet Chew by mouth daily.   beclomethasone (QVAR REDIHALER) 40 MCG/ACT inhaler Inhale 2 puffs into the lungs 2 (two) times daily.   Benfotiamine 150 MG CAPS Take by mouth.   Calcium Citrate-Vitamin D (CITRACAL + D PO) Take 2 tablets by mouth daily.    Cyanocobalamin (VITAMIN B-12) 5000 MCG SUBL Place 5,000 mcg under the tongue daily.   diltiazem (CARDIZEM SR) 120 MG 12 hr capsule Take 1 capsule (120 mg total) by mouth 2 (two) times daily. Please make yearly appt with Dr. Quentin Ore for November 2022 for future refills. Thank you 1st attempt   ELDERBERRY PO Take 1,000 mg by mouth daily.    fluticasone (FLONASE) 50 MCG/ACT nasal spray SPRAY 2 SPRAYS INTO EACH NOSTRIL EVERY DAY   gabapentin (NEURONTIN) 300 MG capsule Take 300 mg by mouth 2 (two) times daily.   Lactobacillus Rhamnosus, GG, (CULTURELLE IMMUNITY SUPPORT PO) Take by mouth.   Multiple Vitamins-Minerals (CENTRUM SILVER 50+WOMEN PO) Take by mouth.   nitroGLYCERIN (NITROSTAT) 0.4 MG SL tablet Place 0.4 mg under the tongue every 5 (five) minutes as needed for chest  pain.   Omega-3 Fatty Acids (FISH OIL) 1200 MG CAPS Take by mouth.   pantoprazole (PROTONIX) 40 MG tablet Take 40 mg by mouth daily.   Red Yeast Rice 600 MG CAPS Take by mouth.   TURMERIC PO Take by mouth.   UBIQUINOL PO Take 100 mg by mouth daily at 12 noon.   vitamin C (ASCORBIC ACID) 250 MG tablet Take 250 mg by mouth daily.   Current Facility-Administered Medications for the 04/06/21 encounter (Office Visit) with Delana Meyer, Dolores Lory, MD  Medication   methylPREDNISolone acetate (DEPO-MEDROL) injection 80 mg    Past Medical History:  Diagnosis Date   ALLERGIC RHINITIS 04/03/2007   Anemia    Anxiety    Asthma    hx of years ago - no longer a problem    ASTHMA, PERSISTENT, MODERATE 04/03/2007   Cancer ([place])    skin    Dyspnea on exertion    a. 11/2007 Echo: EF 60%.   Dysrhythmia    hx of heart arrhythmia 10-15 years ago - followed by DR Lovena Le - not seen in years    GERD (gastroesophageal reflux disease)    Hemophilia carrier    Hypertension    Joint pain    HIPS / LEGS   Knee injury    RT   Meningioma ([place])    Morbid obesity ([place])    Paroxysmal SVT (supraventricular tachycardia) ([place])    a. 11/2011 48h Holter: RSR, rare PVC's, occas PAC's  Retinal tear of right eye 01/2016   Ventral hernia     Past Surgical History:  Procedure Laterality Date   APPENDECTOMY  1996   BREAST CYST EXCISION Right 1994   Benign   CESAREAN SECTION     x2   [place]   EYE SURGERY  01/2016   Repair retinal tear   FOOT SURGERY  1995 / 1996   x2   HERNIA REPAIR     KNEE ARTHROSCOPY W/ MENISCAL REPAIR  07/25/13   right knee Dr. Mardelle Matte   LAPAROSCOPIC GASTRIC SLEEVE RESECTION N/A 06/08/2013   Procedure: LAPAROSCOPIC GASTRIC SLEEVE RESECTION AND EXCISION OF SEBACEUS CYST FROM MID CHEST takedown of incarcerated ventral hernia and primary repair endoscopy;  Surgeon: [name], MD;  Location: [facility];  Service: General;  Laterality: N/A;   LAPAROSCOPIC NISSEN FUNDOPLICATION  N/A [id number]   Procedure: LAPAROSCOPIC REPAIR LARGE SYMPTOMATIC HIATAL HERNIA WITH UPPER ENDOSCOPY;  Surgeon: Johnathan Hausen, MD;  Location: [facility];  Service: General;  Laterality: N/A;   moles  06/2013   removed 2 moles from under arm and  lowback   NECK SURGERY     occipital nerve damage- injections    OVARIAN CYST [place] Right 01/2016   [place]   SKIN CANCER EXCISION     TONSILLECTOMY     UPPER GI ENDOSCOPY  06/08/2013   Procedure: UPPER GI ENDOSCOPY;  Surgeon: [name], MD;  Location: [facility];  Service: General;;   US ECHOCARDIOGRAPHY  12/2011   WNL - EF 55-60%, mild MR, grade 1 diastolic dysnfiction (mild)   VENTRAL HERNIA REPAIR  2000    Social History Social History   Tobacco Use   Smoking status: Unknown   Smokeless tobacco: Never   Tobacco comments:    smoke at age 41-12  Vaping Use   Vaping Use: Never used  Substance Use Topics   Alcohol use: Not Currently    Alcohol/week: 0.0 standard drinks   Drug use: No    Family History Family History  Problem Relation Age of Onset   Breast cancer Mother    Diabetes Mother    Hypertension Mother    Kidney failure Mother    Diabetes Father    Hypertension Father    Diabetes Other    Hypertension Other    Stroke Other    Hemophilia Other     Allergies  Allergen Reactions   Wasp Venom Anaphylaxis   Chlorhexidine Gluconate     Itching under breasts   Metronidazole Other (See Comments)    Chest tightness, neck tightness   Prednisone Other (See Comments)    Severe migraine / with taper dose   Sodium Hypochlorite Rash    Liquid clorox bleach     REVIEW OF SYSTEMS (Negative unless checked)  Constitutional: [] Weight loss  [] Fever  [] Chills Cardiac: [] Chest pain   [] Chest pressure   [] Palpitations   [] Shortness of breath when laying flat   [] Shortness of breath with exertion. Vascular:  [] Pain in legs with walking   [x] Pain in legs  at rest  [] History of DVT   [] Phlebitis   [x] Swelling in legs   [] Varicose veins   [] Non-healing ulcers Pulmonary:   [] Uses home oxygen   [] Productive cough   [] Hemoptysis   [] Wheeze  [] COPD   [x] Asthma Neurologic:  [] Dizziness   [] Seizures   [] History of stroke   [] History of TIA  []   Aphasia   [] Vissual changes   [] Weakness or numbness in arm   [] Weakness or numbness in leg Musculoskeletal:   [] Joint swelling   [x] Joint pain   [] Low back pain Hematologic:  [] Easy bruising  [] Easy bleeding   [] Hypercoagulable state   [] Anemic Gastrointestinal:  [] Diarrhea   [] Vomiting  [x] Gastroesophageal reflux/heartburn   [] Difficulty swallowing. Genitourinary:  [] Chronic kidney disease   [] Difficult urination  [] Frequent urination   [] Blood in urine Skin:  [] Rashes   [] Ulcers  Psychological:  [] History of anxiety   []  History of major depression.  Physical Examination  Vitals:   04/06/21 1055  BP: 125/80  Pulse: 67  Weight: 271 lb (122.9 kg)  Height: 5\' 6"  (1.676 m)   Body mass index is 43.74 kg/m. Gen: WD/WN, NAD Head: [place]/AT, No temporalis wasting.  Ear/Nose/Throat: Hearing grossly intact, nares w/o erythema or drainage, pinna without lesions Eyes: PER, EOMI, sclera nonicteric.  Neck: Supple, no gross masses.  No JVD.  Pulmonary:  Good air movement, no audible wheezing, no use of accessory muscles.  Cardiac: RRR, precordium not hyperdynamic. Vascular:  Diffuse large varicosities, >8 mm, present bilaterally.  Moderate venous stasis changes to the legs bilaterally.  3-4+ soft pitting edema left leg greater than right Vessel Right Left  Radial Palpable Palpable  Gastrointestinal: soft, non-distended. No guarding/no peritoneal signs.  Musculoskeletal: M/S 5/5 throughout.  No deformity.  Neurologic: CN 2-12 intact. Pain and light touch intact in extremities.  Symmetrical.  Speech is fluent. Motor exam as listed above. Psychiatric: Judgment intact, Mood & affect appropriate for pt's clinical  situation. Dermatologic: Venous rashes no ulcers noted.  No changes consistent with cellulitis. Lymph : No lichenification or skin changes of chronic lymphedema.  CBC Lab Results  Component Value Date   WBC 6.3 10/17/2020   HGB 13.1 10/17/2020   HCT 40.9 10/17/2020   MCV 86.3 10/17/2020   PLT 293 10/17/2020    BMET    Component Value Date/Time   NA 140 02/24/2021 0719   NA 139 01/21/2020 1137   K 4.2 02/24/2021 0719   CL 103 02/24/2021 0719   CO2 29 02/24/2021 0719   GLUCOSE 96 02/24/2021 0719   BUN 22 02/24/2021 0719   BUN 16 01/21/2020 1137   CREATININE 0.46 02/24/2021 0719   CREATININE 0.57 01/01/2020 1623   CALCIUM 9.5 02/24/2021 0719   GFRNONAA >60 02/24/2021 0719   GFRAA >60 02/02/2020 1417   CrCl cannot be calculated (Patient's most recent lab result is older than the maximum 21 days allowed.).  COAG Lab Results  Component Value Date   INR 1.0 12/03/2007   INR 1.0 11/25/2007    Radiology No results found.   Assessment/Plan 1. Lymphedema Recommend:  No surgery or intervention at this point in time.    I have reviewed my previous discussion with the patient regarding swelling and why it causes symptoms.  Patient will continue wearing graduated compression stockings class 1 (20-30 mmHg) on a daily basis. The patient will  beginning wearing the stockings first thing in the morning and removing them in the evening. The patient is instructed specifically not to sleep in the stockings.    In addition, behavioral modification including several periods of elevation of the lower extremities during the day will be continued.  This was reviewed with the patient during the initial visit.  The patient will also continue routine exercise, especially walking on a daily basis as was discussed during the initial visit.    Despite conservative treatments  including graduated compression therapy class 1 and behavioral modification including exercise and elevation the patient   has not obtained adequate control of the lymphedema.  The patient still has stage 3 lymphedema and therefore, I believe that a lymph pump should be added to improve the control of the patient's lymphedema.  Additionally, a lymph pump is warranted because it will reduce the risk of cellulitis and ulceration in the future.  Patient should follow-up in six months    2. Chronic venous insufficiency Recommend  I have reviewed my previous  discussion with the patient regarding  varicose veins and why they cause symptoms. Patient will continue  wearing graduated compression stockings class 1 on a daily basis, beginning first thing in the morning and removing them in the evening.    In addition, behavioral modification including elevation during the day was again discussed and this will continue.  The patient has utilized over the counter pain medications and has been exercising.  However, at this time conservative therapy has not alleviated the patient's symptoms of leg pain and swelling  Recommend: laser ablation of the left and right great saphenous veins to eliminate the symptoms of pain and swelling of the lower extremities caused by the severe superficial venous reflux disease.  Left leg is worse and will be treated first.  3. Coronary artery disease of native artery of native heart with stable angina pectoris (HCC) Continue cardiac and antihypertensive medications as already ordered and reviewed, no changes at this time.  Continue statin as ordered and reviewed, no changes at this time  Nitrates PRN for chest pain   4. Primary hypertension Continue antihypertensive medications as already ordered, these medications have been reviewed and there are no changes at this time.   5. Hypercholesteremia Continue statin as ordered and reviewed, no changes at this time     Hortencia Pilar, MD  04/06/2021 11:07 AM

## 2021-04-07 NOTE — Telephone Encounter (Signed)
Kimberly Patton, can you look into this for me? The patient is particularly anxious given it is not a screening colonoscopy but is a diagnostic one due to an abnormal cologuard... she is worried about cancer.  Maybe see what the expected date is and let pt know the time line.   As you can see in the notes, I already gave her the info you gave [place] providers about the gastro delay and calling. I think she has called other locations as well.  I appreciate it as always.

## 2021-04-10 ENCOUNTER — Other Ambulatory Visit: Payer: Self-pay

## 2021-04-10 ENCOUNTER — Ambulatory Visit
Admission: RE | Admit: 2021-04-10 | Discharge: 2021-04-10 | Disposition: A | Discharge status: Home / Self Care | Payer: Medicare Other | Source: Ambulatory Visit | Attending: Family Medicine | Admitting: Family Medicine

## 2021-04-10 ENCOUNTER — Telehealth: Payer: Self-pay

## 2021-04-10 ENCOUNTER — Ambulatory Visit: Payer: Medicare Other

## 2021-04-10 DIAGNOSIS — Z1231 Encounter for screening mammogram for malignant neoplasm of breast: Secondary | ICD-10-CM

## 2021-04-10 NOTE — Telephone Encounter (Signed)
Pt. Calling to schedule colonoscopy 

## 2021-04-11 ENCOUNTER — Other Ambulatory Visit: Payer: Self-pay | Admitting: Family Medicine

## 2021-04-11 DIAGNOSIS — R928 Other abnormal and inconclusive findings on diagnostic imaging of breast: Secondary | ICD-10-CM

## 2021-04-11 NOTE — Telephone Encounter (Signed)
Referral is in the work que.

## 2021-04-13 ENCOUNTER — Telehealth: Payer: Self-pay

## 2021-04-13 MED ORDER — ROSUVASTATIN CALCIUM 10 MG PO TABS: 10.0000 mg | ORAL_TABLET | Freq: Every day | ORAL | 3 refills | Status: DC

## 2021-04-13 NOTE — Telephone Encounter (Signed)
Sent in a new prescription for Rosuvastatin 10 MG once a day per MyChart conversation below.   Go ahead and call it in. I'll give it try.     You  Tarri Glenn R 2 hours ago (1:33 PM)   Hi Kimberly Patton,   Sorry I have been out of the office some, but I am back!  Our pharmacist had the following suggestion:   Would recommend trying rosuvastatin 10mg  daily instead, it's typically tolerated better than atorvastatin. Red yeast rice is not as effective for cholesterol lowering and does not have the same cardiac benefit that statins do.    I also spoke with Dr. Garen Lah and he stated if you do not want to try the Rosuvastatin, and want to try the red yeast rice, we can just check your lipids again in 3 months and see where we are at.   Let me know what you would like to try. Hope you are doing well!

## 2021-04-14 ENCOUNTER — Other Ambulatory Visit: Payer: Self-pay

## 2021-04-14 DIAGNOSIS — R195 Other fecal abnormalities: Secondary | ICD-10-CM

## 2021-04-14 MED ORDER — NA SULFATE-K SULFATE-MG SULF 17.5-3.13-1.6 GM/177ML PO SOLN: 1.0000 | Freq: Once | ORAL | 0 refills | Status: AC

## 2021-04-14 NOTE — Progress Notes (Signed)
Gastroenterology Pre-Procedure Review  Request Date: 04/25/21 Requesting Physician: [name]  PATIENT REVIEW QUESTIONS: The patient responded to the following health history questions as indicated:    1. Are you having any GI issues? no 2. Do you have a personal history of Polyps? no 3. Do you have a family history of Colon Cancer or Polyps? no 4. Diabetes Mellitus? no 5. Joint replacements in the past 12 months?no 6. Major health problems in the past 3 months?no 7. Any artificial heart valves, MVP, or defibrillator?no    MEDICATIONS & ALLERGIES:    Patient reports the following regarding taking any anticoagulation/antiplatelet therapy:   Plavix, Coumadin, Eliquis, Xarelto, Lovenox, Pradaxa, Brilinta, or Effient? no Aspirin? yes (81 mg)  Patient confirms/reports the following medications:  Current Outpatient Medications  Medication Sig Dispense Refill   aspirin 81 MG chewable tablet Chew by mouth daily.     beclomethasone (QVAR REDIHALER) 40 MCG/ACT inhaler Inhale 2 puffs into the lungs 2 (two) times daily. 1 each 5   Benfotiamine 150 MG CAPS Take by mouth.     Calcium Citrate-Vitamin D (CITRACAL + D PO) Take 2 tablets by mouth daily.      Cyanocobalamin (VITAMIN B-12) 5000 MCG SUBL Place 5,000 mcg under the tongue daily.     diltiazem (CARDIZEM SR) 120 MG 12 hr capsule Take 1 capsule (120 mg total) by mouth 2 (two) times daily. Please make yearly appt with Dr. Quentin Ore for November 2022 for future refills. Thank you 1st attempt 180 capsule 0   ELDERBERRY PO Take 1,000 mg by mouth daily.      fluticasone (FLONASE) 50 MCG/ACT nasal spray SPRAY 2 SPRAYS INTO EACH NOSTRIL EVERY DAY 48 mL 3   gabapentin (NEURONTIN) 300 MG capsule Take 300 mg by mouth 2 (two) times daily.     Lactobacillus Rhamnosus, GG, (CULTURELLE IMMUNITY SUPPORT PO) Take by mouth.     Multiple Vitamins-Minerals (CENTRUM SILVER 50+WOMEN PO) Take by mouth.     nitroGLYCERIN (NITROSTAT) 0.4 MG SL tablet Place 0.4 mg  under the tongue every 5 (five) minutes as needed for chest pain.     Omega-3 Fatty Acids (FISH OIL) 1200 MG CAPS Take by mouth.     pantoprazole (PROTONIX) 40 MG tablet Take 40 mg by mouth daily.     Red Yeast Rice 600 MG CAPS Take by mouth.     rosuvastatin (CRESTOR) 10 MG tablet Take 1 tablet (10 mg total) by mouth daily. 30 tablet 3   TURMERIC PO Take by mouth.     UBIQUINOL PO Take 100 mg by mouth daily at 12 noon.     vitamin C (ASCORBIC ACID) 250 MG tablet Take 250 mg by mouth daily.     Current Facility-Administered Medications  Medication Dose Route Frequency Provider Last Rate Last Admin   methylPREDNISolone acetate (DEPO-MEDROL) injection 80 mg  80 mg Intramuscular Once Jearld Fenton, NP        Patient confirms/reports the following allergies:  Allergies  Allergen Reactions   Wasp Venom Anaphylaxis   Chlorhexidine Gluconate     Itching under breasts   Metronidazole Other (See Comments)    Chest tightness, neck tightness   Prednisone Other (See Comments)    Severe migraine / with taper dose   Sodium Hypochlorite Rash    Liquid clorox bleach    No orders of the defined types were placed in this encounter.   AUTHORIZATION INFORMATION Primary Insurance: 1D#: Group #:  Secondary Insurance: 1D#: Group #:  SCHEDULE INFORMATION: Date: 04/25/21 Time: Location: [place]

## 2021-04-17 ENCOUNTER — Telehealth: Payer: Self-pay

## 2021-04-17 ENCOUNTER — Other Ambulatory Visit: Payer: Self-pay | Admitting: Family Medicine

## 2021-04-17 NOTE — Telephone Encounter (Signed)
Pt. Calling she is to have a colonoscopy but says she forgot to mention that she has a hernia mesh and gastric sleeve. She is requesting a call back.

## 2021-04-18 NOTE — Telephone Encounter (Signed)
Called patient to let her know that per [name], it is okay for her to proceed with her colonoscopy. Patient was relieved.

## 2021-04-18 NOTE — Telephone Encounter (Signed)
[name], is the patient okay to proceed with her colonoscopy? Please advise.

## 2021-04-18 NOTE — Telephone Encounter (Signed)
Okay to schedule next available appointment.  Thank you

## 2021-04-19 ENCOUNTER — Ambulatory Visit
Admission: RE | Admit: 2021-04-19 | Discharge: 2021-04-19 | Disposition: A | Discharge status: Home / Self Care | Payer: Medicare Other | Source: Ambulatory Visit | Attending: Family Medicine | Admitting: Family Medicine

## 2021-04-19 ENCOUNTER — Other Ambulatory Visit: Payer: Self-pay

## 2021-04-19 ENCOUNTER — Other Ambulatory Visit: Payer: Self-pay | Admitting: Family Medicine

## 2021-04-19 ENCOUNTER — Ambulatory Visit: Payer: Medicare Other

## 2021-04-19 DIAGNOSIS — R921 Mammographic calcification found on diagnostic imaging of breast: Secondary | ICD-10-CM

## 2021-04-19 DIAGNOSIS — N6489 Other specified disorders of breast: Secondary | ICD-10-CM

## 2021-04-19 DIAGNOSIS — R928 Other abnormal and inconclusive findings on diagnostic imaging of breast: Secondary | ICD-10-CM

## 2021-04-19 DIAGNOSIS — R922 Inconclusive mammogram: Secondary | ICD-10-CM | POA: Diagnosis not present

## 2021-04-19 NOTE — Telephone Encounter (Signed)
Patient returned call stating already scheduled at another facility.  Referral closed.

## 2021-04-19 NOTE — Telephone Encounter (Signed)
Left voicemail for patient to return call.

## 2021-04-26 NOTE — Telephone Encounter (Signed)
Tammy, please advise. Thanks 

## 2021-04-27 NOTE — Telephone Encounter (Signed)
Yes that drug notification is because the medication has a steroid in it but is an inhaled steroid so it is typically more localized and not as much systemic effects.  So it is okay for you to take this and still have osteoporosis

## 2021-04-28 ENCOUNTER — Ambulatory Visit
Admission: RE | Admit: 2021-04-28 | Discharge: 2021-04-28 | Disposition: A | Discharge status: Home / Self Care | Payer: Medicare Other | Source: Ambulatory Visit | Attending: Family Medicine | Admitting: Family Medicine

## 2021-04-28 ENCOUNTER — Other Ambulatory Visit: Payer: Self-pay

## 2021-04-28 DIAGNOSIS — R921 Mammographic calcification found on diagnostic imaging of breast: Secondary | ICD-10-CM

## 2021-04-28 DIAGNOSIS — N6489 Other specified disorders of breast: Secondary | ICD-10-CM

## 2021-04-28 DIAGNOSIS — R928 Other abnormal and inconclusive findings on diagnostic imaging of breast: Secondary | ICD-10-CM | POA: Diagnosis not present

## 2021-04-28 DIAGNOSIS — N62 Hypertrophy of breast: Secondary | ICD-10-CM | POA: Diagnosis not present

## 2021-05-05 ENCOUNTER — Other Ambulatory Visit (HOSPITAL_COMMUNITY): Payer: Self-pay

## 2021-05-05 NOTE — Telephone Encounter (Signed)
PA team, can you guys assist with this?

## 2021-05-07 ENCOUNTER — Encounter: Payer: Self-pay | Admitting: Family Medicine

## 2021-05-08 ENCOUNTER — Encounter: Payer: Self-pay | Admitting: Gastroenterology

## 2021-05-08 ENCOUNTER — Other Ambulatory Visit (HOSPITAL_COMMUNITY): Payer: Self-pay

## 2021-05-08 ENCOUNTER — Telehealth: Payer: Self-pay | Admitting: Adult Health

## 2021-05-08 NOTE — Telephone Encounter (Signed)
Do you want her on the 0.25 or the 0.5 mg pulmicort nebs?

## 2021-05-08 NOTE — Telephone Encounter (Signed)
Tammy, please see below message. Thanks

## 2021-05-08 NOTE — Telephone Encounter (Signed)
Spoke to patient and relayed below message.  Patient would like to think about this and call back with update.

## 2021-05-08 NOTE — Telephone Encounter (Signed)
Please see 04/26/2021 mychart message    Patient is requesting alternative to qvar. Symbicort is not affordable with 47 dollar co pay.  Pharmacy team, is there a cheaper alternative or patient assistance?

## 2021-05-08 NOTE — Telephone Encounter (Signed)
0.5mg 

## 2021-05-08 NOTE — Telephone Encounter (Signed)
Please let her know that inhalers are all similar in price .  Can try pulmicort Neb Twice daily  .  Most likely will need neb machine and tubing as well.

## 2021-05-08 NOTE — Telephone Encounter (Signed)
Tammy, please advise.  Patient is requesting alternative to qvar. Symbicort is covered with 47 dollar copay.

## 2021-05-09 ENCOUNTER — Ambulatory Visit: Payer: Medicare Other | Admitting: Certified Registered"

## 2021-05-09 ENCOUNTER — Encounter: Payer: Self-pay | Admitting: Gastroenterology

## 2021-05-09 ENCOUNTER — Other Ambulatory Visit: Payer: Self-pay

## 2021-05-09 ENCOUNTER — Ambulatory Visit
Admission: RE | Admit: 2021-05-09 | Discharge: 2021-05-09 | Disposition: A | Discharge status: Home / Self Care | Payer: Medicare Other | Source: Ambulatory Visit | Attending: Gastroenterology | Admitting: Gastroenterology

## 2021-05-09 ENCOUNTER — Encounter
Admission: RE | Disposition: A | Discharge status: Home / Self Care | Payer: Self-pay | Source: Ambulatory Visit | Attending: Gastroenterology

## 2021-05-09 ENCOUNTER — Other Ambulatory Visit: Payer: Self-pay | Admitting: Family Medicine

## 2021-05-09 DIAGNOSIS — Z6841 Body Mass Index (BMI) 40.0 and over, adult: Secondary | ICD-10-CM | POA: Diagnosis not present

## 2021-05-09 DIAGNOSIS — D126 Benign neoplasm of colon, unspecified: Secondary | ICD-10-CM | POA: Diagnosis not present

## 2021-05-09 DIAGNOSIS — D122 Benign neoplasm of ascending colon: Secondary | ICD-10-CM | POA: Insufficient documentation

## 2021-05-09 DIAGNOSIS — I10 Essential (primary) hypertension: Secondary | ICD-10-CM | POA: Insufficient documentation

## 2021-05-09 DIAGNOSIS — Z9884 Bariatric surgery status: Secondary | ICD-10-CM | POA: Insufficient documentation

## 2021-05-09 DIAGNOSIS — J45909 Unspecified asthma, uncomplicated: Secondary | ICD-10-CM | POA: Insufficient documentation

## 2021-05-09 DIAGNOSIS — K219 Gastro-esophageal reflux disease without esophagitis: Secondary | ICD-10-CM | POA: Diagnosis not present

## 2021-05-09 DIAGNOSIS — D123 Benign neoplasm of transverse colon: Secondary | ICD-10-CM | POA: Diagnosis not present

## 2021-05-09 DIAGNOSIS — Z79899 Other long term (current) drug therapy: Secondary | ICD-10-CM | POA: Diagnosis not present

## 2021-05-09 DIAGNOSIS — Z85828 Personal history of other malignant neoplasm of skin: Secondary | ICD-10-CM | POA: Insufficient documentation

## 2021-05-09 DIAGNOSIS — I2510 Atherosclerotic heart disease of native coronary artery without angina pectoris: Secondary | ICD-10-CM | POA: Insufficient documentation

## 2021-05-09 DIAGNOSIS — K635 Polyp of colon: Secondary | ICD-10-CM | POA: Diagnosis not present

## 2021-05-09 DIAGNOSIS — R195 Other fecal abnormalities: Secondary | ICD-10-CM | POA: Diagnosis not present

## 2021-05-09 HISTORY — PX: COLONOSCOPY WITH PROPOFOL: SHX5780

## 2021-05-09 SURGERY — COLONOSCOPY WITH PROPOFOL
Anesthesia: General

## 2021-05-09 MED ORDER — SODIUM CHLORIDE 0.9 % IV SOLN: INTRAVENOUS | Status: DC

## 2021-05-09 MED ORDER — PROPOFOL 500 MG/50ML IV EMUL
INTRAVENOUS | Status: DC | PRN
  Administered 2021-05-09: 130 ug/kg/min via INTRAVENOUS

## 2021-05-09 MED ORDER — BENZONATATE 200 MG PO CAPS: 200.0000 mg | ORAL_CAPSULE | Freq: Two times a day (BID) | ORAL | 0 refills | Status: DC | PRN

## 2021-05-09 MED ORDER — GUAIFENESIN-CODEINE 100-10 MG/5ML PO SYRP: 5.0000 mL | ORAL_SOLUTION | Freq: Every evening | ORAL | 0 refills | Status: DC | PRN

## 2021-05-09 MED ORDER — PROPOFOL 10 MG/ML IV BOLUS
INTRAVENOUS | Status: DC | PRN
  Administered 2021-05-09: 100 mg via INTRAVENOUS

## 2021-05-09 NOTE — H&P (Signed)
Kimberly Bellows, MD [address] [place], [place], [place], [address] [address], [place], [place], [place], [address] Phone: [phone]  Fax: [phone]  Primary Care Physician:  Jinny Sanders, MD   Pre-Procedure History & Physical: HPI:  Kimberly Patton is a 65 y.o. female is here for an colonoscopy.   Past Medical History:  Diagnosis Date   ALLERGIC RHINITIS 04/03/2007   Anemia    Anxiety    Asthma    hx of years ago - no longer a problem    ASTHMA, PERSISTENT, MODERATE 04/03/2007   Cancer ([place])    skin    Dyspnea on exertion    a. 11/2007 Echo: EF 60%.   Dysrhythmia    hx of heart arrhythmia 10-15 years ago - followed by DR Lovena Le - not seen in years    GERD (gastroesophageal reflux disease)    Hemophilia carrier    Hypertension    Joint pain    HIPS / LEGS   Knee injury    RT   Meningioma ([place])    Morbid obesity ([place])    Paroxysmal SVT (supraventricular tachycardia) ([place])    a. 11/2011 48h Holter: RSR, rare PVC's, occas PAC's   Retinal tear of right eye 01/2016   Ventral hernia     Past Surgical History:  Procedure Laterality Date   APPENDECTOMY  1996   BREAST CYST EXCISION Right 1994   Benign   CESAREAN SECTION     x2   [place]  01/2016   Repair retinal tear   FOOT SURGERY  1995 / 1996   x2   HERNIA REPAIR     KNEE ARTHROSCOPY W/ MENISCAL REPAIR  07/25/13   right knee Dr. Mardelle Matte   LAPAROSCOPIC GASTRIC SLEEVE RESECTION N/A 06/08/2013   Procedure: LAPAROSCOPIC GASTRIC SLEEVE RESECTION AND EXCISION OF SEBACEUS CYST FROM MID CHEST takedown of incarcerated ventral hernia and primary repair endoscopy;  Surgeon: [name], MD;  Location: [facility];  Service: General;  Laterality: N/A;   LAPAROSCOPIC NISSEN FUNDOPLICATION N/A 01/27/2992   Procedure: LAPAROSCOPIC REPAIR LARGE SYMPTOMATIC HIATAL HERNIA WITH UPPER ENDOSCOPY;  Surgeon: Johnathan Hausen, MD;  Location: [facility];  Service: General;  Laterality: N/A;   moles   06/2013   removed 2 moles from under arm and  lowback   NECK SURGERY     occipital nerve damage- injections    OVARIAN CYST [place] Right 01/2016   [place]   SKIN CANCER EXCISION     TONSILLECTOMY     UPPER GI ENDOSCOPY  06/08/2013   Procedure: UPPER GI ENDOSCOPY;  Surgeon: [name], MD;  Location: [facility];  Service: General;;   US ECHOCARDIOGRAPHY  12/2011   WNL - EF 55-60%, mild MR, grade 1 diastolic dysnfiction (mild)   VENTRAL HERNIA REPAIR  2000    Prior to Admission medications   Medication Sig Start Date End Date Taking? Authorizing Provider  aspirin 81 MG chewable tablet Chew by mouth daily.   Yes [provider]  beclomethasone (QVAR REDIHALER) 40 MCG/ACT inhaler Inhale 2 puffs into the lungs 2 (two) times daily. 02/14/21  Yes Parrett, Tammy S, NP  Calcium Citrate-Vitamin D (CITRACAL + D PO) Take 2 tablets by mouth daily.    Yes [provider]  Cyanocobalamin (VITAMIN B-12) 5000 MCG SUBL Place 5,000 mcg under the tongue daily.  Yes [provider]  diltiazem (CARDIZEM SR) 120 MG 12 hr capsule Take 1 capsule (120 mg total) by mouth 2 (two) times daily. Please make yearly appt with Dr. Quentin Ore for November 2022 for future refills. Thank you 1st attempt 03/03/21  Yes Vickie Epley, MD  ELDERBERRY PO Take 1,000 mg by mouth daily.    Yes [provider]  fluticasone (FLONASE) 50 MCG/ACT nasal spray SPRAY 2 SPRAYS INTO EACH NOSTRIL EVERY DAY 04/17/21  Yes Bedsole, Amy E, MD  gabapentin (NEURONTIN) 300 MG capsule Take 300 mg by mouth 2 (two) times daily. 02/23/21  Yes [provider]  Lactobacillus Rhamnosus, GG, (CULTURELLE IMMUNITY SUPPORT PO) Take by mouth.   Yes [provider]  Multiple Vitamins-Minerals (CENTRUM SILVER 50+WOMEN PO) Take by mouth.   Yes [provider]  Omega-3 Fatty Acids (FISH OIL) 1200 MG CAPS Take by mouth.   Yes  [provider]  pantoprazole (PROTONIX) 40 MG tablet Take 40 mg by mouth daily. 09/09/20  Yes [provider]  Red Yeast Rice 600 MG CAPS Take by mouth.   Yes [provider]  rosuvastatin (CRESTOR) 10 MG tablet Take 1 tablet (10 mg total) by mouth daily. 04/13/21 07/12/21 Yes [name], MD  TURMERIC PO Take by mouth.   Yes [provider]  UBIQUINOL PO Take 100 mg by mouth daily at 12 noon.   Yes [provider]  vitamin C (ASCORBIC ACID) 250 MG tablet Take 250 mg by mouth daily.   Yes [provider]  Benfotiamine 150 MG CAPS Take by mouth.    [provider]  nitroGLYCERIN (NITROSTAT) 0.4 MG SL tablet Place 0.4 mg under the tongue every 5 (five) minutes as needed for chest pain.    [provider]    Allergies as of 04/14/2021 - Review Complete 04/06/2021  Allergen Reaction Noted   Wasp venom Anaphylaxis 07/17/2016   Chlorhexidine gluconate  02/02/2020   Metronidazole Other (See Comments) 04/18/2020   Prednisone Other (See Comments) 02/01/2017   Sodium hypochlorite Rash 07/17/2016    Family History  Problem Relation Age of Onset   Breast cancer Mother    Diabetes Mother    Hypertension Mother    Kidney failure Mother    Diabetes Father    Hypertension Father    Diabetes Other    Hypertension Other    Stroke Other    Hemophilia Other     Social History   Socioeconomic History   Marital status: Divorced    Spouse name: Not on file   Number of children: 2   Years of education: 45   Highest education level: Not on file  Occupational History   Occupation: Product manager: [employer]  Tobacco Use   Smoking status: Unknown   Smokeless tobacco: Never   Tobacco comments:    smoke at age [age]  Vaping Use   Vaping Use: Never used  Substance and Sexual Activity   Alcohol use: Not Currently    Alcohol/week: 0.0 standard drinks   Drug use: No   Sexual activity: Not on file   Other Topics Concern   Not on file  Social History Narrative   Lives at home alone.   Right-handed.   Occasional caffeine use.   Social Determinants of Health   Financial Resource Strain: Not on file  Food Insecurity: Not on file  Transportation Needs: Not on file  Physical Activity: Not on file  Stress: Not on  file  Social Connections: Not on file  Intimate Partner Violence: Not on file    Review of Systems: See HPI, otherwise negative ROS  Physical Exam: There were no vitals taken for this visit. General:   Alert,  pleasant and cooperative in NAD Head:  Normocephalic and atraumatic. Neck:  Supple; no masses or thyromegaly. Lungs:  Clear throughout to auscultation, normal respiratory effort.    Heart:  +S1, +S2, Regular rate and rhythm, No edema. Abdomen:  Soft, nontender and nondistended. Normal bowel sounds, without guarding, and without rebound.   Neurologic:  Alert and  oriented x4;  grossly normal neurologically.  Impression/Plan: Kimberly Patton is here for an colonoscopy to be performed for positive cologuard.  Risks, benefits, limitations, and alternatives regarding  colonoscopy have been reviewed with the patient.  Questions have been answered.  All parties agreeable.   Kimberly Bellows, MD  05/09/2021, 9:57 AM

## 2021-05-09 NOTE — Op Note (Signed)
[facility]astroenterology Patient Name: Kimberly Patton Procedure Date: 05/09/2021 10:40 AM MRN: [id number] Account #: [id number] Date of Birth: [birth year] Admit Type: Outpatient Age: 65 Room: [facility] ENDO ROOM 2 Gender: Female Note Status: Finalized Instrument Name: Jasper Riling [id number] Procedure:             Colonoscopy Indications:           Positive Cologuard test Providers:             [name] MD, MD Referring MD:          Jinny Sanders MD, MD (Referring MD) Medicines:             Monitored Anesthesia Care Complications:         No immediate complications. Procedure:             Pre-Anesthesia Assessment:                        - Prior to the procedure, a History and Physical was                         performed, and patient medications, allergies and                         sensitivities were reviewed. The patient's tolerance                         of previous anesthesia was reviewed.                        - The risks and benefits of the procedure and the                         sedation options and risks were discussed with the                         patient. All questions were answered and informed                         consent was obtained.                        - ASA Grade Assessment: II - A patient with mild                         systemic disease.                        After obtaining informed consent, the colonoscope was                         passed under direct vision. Throughout the procedure,                         the patient's blood pressure, pulse, and oxygen                         saturations were monitored continuously. The                         Colonoscope was introduced  through the anus and                         advanced to the the cecum, identified by the                         appendiceal orifice. The colonoscopy was performed                         without difficulty. The patient tolerated the                          procedure well. The quality of the bowel preparation                         was excellent. Findings:      The perianal and digital rectal examinations were normal.      Two sessile polyps were found in the transverse colon. The polyps were 5       to 6 mm in size. These polyps were removed with a cold snare. Resection       and retrieval were complete.      A 5 mm polyp was found in the ascending colon. The polyp was sessile.       The polyp was removed with a cold snare. Resection and retrieval were       complete.      The exam was otherwise without abnormality on direct and retroflexion       views. Impression:            - Two 5 to 6 mm polyps in the transverse colon,                         removed with a cold snare. Resected and retrieved.                        - One 5 mm polyp in the ascending colon, removed with                         a cold snare. Resected and retrieved.                        - The examination was otherwise normal on direct and                         retroflexion views. Recommendation:        - Discharge patient to home (with escort).                        - Resume previous diet.                        - Continue present medications.                        - Await pathology results.                        - Repeat colonoscopy for surveillance based on  pathology results. Procedure Code(s):     --- Professional ---                        [phone], Colonoscopy, flexible; with removal of                         tumor(s), polyp(s), or other lesion(s) by snare                         technique Diagnosis Code(s):     --- Professional ---                        K63.5, Polyp of colon                        R19.5, Other fecal abnormalities CPT copyright 2019 American Medical Association. All rights reserved. The codes documented in this report are preliminary and upon coder review may  be revised to meet current compliance  requirements. [name], MD [name] MD, MD 05/09/2021 11:08:02 AM This report has been signed electronically. Number of Addenda: 0 Note Initiated On: 05/09/2021 10:40 AM Scope Withdrawal Time: 0 hours 11 minutes 4 seconds  Total Procedure Duration: 0 hours 19 minutes 32 seconds  Estimated Blood Loss:  Estimated blood loss: none.      [facility]

## 2021-05-09 NOTE — Transfer of Care (Signed)
Immediate Anesthesia Transfer of Care Note  Patient: Kimberly Patton  Procedure(s) Performed: COLONOSCOPY WITH PROPOFOL  Patient Location: PACU and Endoscopy Unit  Anesthesia Type:General  Level of Consciousness: drowsy  Airway & Oxygen Therapy: Patient Spontanous Breathing  Post-op Assessment: Report given to RN  Post vital signs: stable  Last Vitals:  Vitals Value Taken Time  BP    Temp    Pulse    Resp    SpO2      Last Pain:  Vitals:   05/09/21 0958  TempSrc: Temporal  PainSc: 0-No pain         Complications: No notable events documented.

## 2021-05-09 NOTE — Anesthesia Postprocedure Evaluation (Signed)
Anesthesia Post Note  Patient: Kimberly Patton  Procedure(s) Performed: COLONOSCOPY WITH PROPOFOL  Patient location during evaluation: Endoscopy Anesthesia Type: General Level of consciousness: awake and alert Pain management: pain level controlled Vital Signs Assessment: post-procedure vital signs reviewed and stable Respiratory status: spontaneous breathing, nonlabored ventilation and respiratory function stable Cardiovascular status: blood pressure returned to baseline and stable Postop Assessment: no apparent nausea or vomiting Anesthetic complications: no   No notable events documented.   Last Vitals:  Vitals:   05/09/21 1108 05/09/21 1122  BP: 108/62 136/69  Pulse: 65   Resp:    Temp: (!) 36.1 C   SpO2: 100%     Last Pain:  Vitals:   05/09/21 1122  TempSrc:   PainSc: 0-No pain                 [place] Ouch

## 2021-05-09 NOTE — Anesthesia Preprocedure Evaluation (Signed)
Anesthesia Evaluation  Patient identified by MRN, date of birth, ID band Patient awake    Reviewed: Allergy & Precautions, H&P , NPO status , Patient's Chart, lab work & pertinent test results, reviewed documented beta blocker date and time   Airway Mallampati: II   Neck ROM: full    Dental  (+) Poor Dentition   Pulmonary asthma ,    Pulmonary exam normal        Cardiovascular Exercise Tolerance: Good hypertension, On Medications + CAD  Normal cardiovascular exam+ dysrhythmias  Rhythm:regular Rate:Normal     Neuro/Psych Anxiety  Neuromuscular disease negative psych ROS   GI/Hepatic Neg liver ROS, GERD  Medicated,  Endo/Other  negative endocrine ROS  Renal/GU negative Renal ROS  negative genitourinary   Musculoskeletal   Abdominal   Peds  Hematology  (+) Blood dyscrasia, anemia ,   Anesthesia Other Findings Past Medical History: 04/03/2007: ALLERGIC RHINITIS No date: Anemia No date: Anxiety No date: Asthma     Comment:  hx of years ago - no longer a problem  04/03/2007: ASTHMA, PERSISTENT, MODERATE No date: Cancer ([place])     Comment:  skin  No date: Dyspnea on exertion     Comment:  a. 11/2007 Echo: EF 60%. No date: Dysrhythmia     Comment:  hx of heart arrhythmia 10-15 years ago - followed by DR               Lovena Le - not seen in years  No date: GERD (gastroesophageal reflux disease) No date: Hemophilia carrier No date: Hypertension No date: Joint pain     Comment:  HIPS / LEGS No date: Knee injury     Comment:  RT No date: Meningioma ([place]) No date: Morbid obesity ([place]) No date: Paroxysmal SVT (supraventricular tachycardia) ([place])     Comment:  a. 11/2011 48h Holter: RSR, rare PVC's, occas PAC's 01/2016: Retinal tear of right eye No date: Ventral hernia Past Surgical History: 1996: APPENDECTOMY 1994: BREAST CYST EXCISION; Right     Comment:  Benign No date: CESAREAN SECTION     Comment:  x2 1995:  CHOLECYSTECTOMY 01/2016: EYE SURGERY     Comment:  Repair retinal tear 1995 / 1996: FOOT SURGERY     Comment:  x2 No date: HERNIA REPAIR 07/25/13: KNEE ARTHROSCOPY W/ MENISCAL REPAIR     Comment:  right knee Dr. Mardelle Matte 06/08/2013: LAPAROSCOPIC GASTRIC SLEEVE RESECTION; N/A     Comment:  Procedure: LAPAROSCOPIC GASTRIC SLEEVE RESECTION AND               EXCISION OF SEBACEUS CYST FROM MID CHEST takedown of               incarcerated ventral hernia and primary repair endoscopy;              Surgeon: [name], MD;  Location: [facility];                Service: General;  Laterality: N/A; 02/02/2020: LAPAROSCOPIC NISSEN FUNDOPLICATION; N/A     Comment:  Procedure: LAPAROSCOPIC REPAIR LARGE SYMPTOMATIC HIATAL               HERNIA WITH UPPER ENDOSCOPY;  Surgeon: Johnathan Hausen,               MD;  Location: [facility];  Service: General;  Laterality:               N/A; 06/2013: moles     Comment:  removed 2  moles from under arm and  lowback No date: NECK SURGERY     Comment:  occipital nerve damage- injections  1970: OVARIAN CYST REMOVAL 1975: PILONIDAL CYST EXCISION 01/2016: RETINAL TEAR REPAIR CRYOTHERAPY; Right     Comment:  [place] No date: SKIN CANCER EXCISION No date: TONSILLECTOMY 06/08/2013: UPPER GI ENDOSCOPY     Comment:  Procedure: UPPER GI ENDOSCOPY;  Surgeon: [name], MD;  Location: [facility];  Service: General;; 12/2011: US ECHOCARDIOGRAPHY     Comment:  WNL - EF 55-60%, mild MR, grade 1 diastolic dysnfiction               (mild) 2000: VENTRAL HERNIA REPAIR   Reproductive/Obstetrics negative OB ROS                             Anesthesia Physical Anesthesia Plan  ASA: 3  Anesthesia Plan: General   Post-op Pain Management:    Induction:   PONV Risk Score and Plan:   Airway Management Planned:   Additional Equipment:   Intra-op Plan:   Post-operative Plan:   Informed Consent: I have reviewed the patients History  and Physical, chart, labs and discussed the procedure including the risks, benefits and alternatives for the proposed anesthesia with the patient or authorized representative who has indicated his/her understanding and acceptance.     Dental Advisory Given  Plan Discussed with: CRNA  Anesthesia Plan Comments:         Anesthesia Quick Evaluation

## 2021-05-10 ENCOUNTER — Other Ambulatory Visit: Payer: Self-pay | Admitting: *Deleted

## 2021-05-10 ENCOUNTER — Other Ambulatory Visit: Payer: Self-pay | Admitting: Family Medicine

## 2021-05-10 ENCOUNTER — Encounter: Payer: Self-pay | Admitting: *Deleted

## 2021-05-10 ENCOUNTER — Encounter: Payer: Self-pay | Admitting: Gastroenterology

## 2021-05-10 LAB — SURGICAL PATHOLOGY

## 2021-05-10 MED ORDER — DILTIAZEM HCL ER 120 MG PO CP12: 120.0000 mg | ORAL_CAPSULE | Freq: Two times a day (BID) | ORAL | 0 refills | Status: DC

## 2021-05-10 NOTE — Telephone Encounter (Signed)
Spoke to patient for update. Patient stated that she has not had time to do research as she had colonoscopy yesterday.  She will call back with update.

## 2021-05-11 ENCOUNTER — Other Ambulatory Visit: Payer: Self-pay

## 2021-05-11 ENCOUNTER — Encounter: Payer: Self-pay | Admitting: Emergency Medicine

## 2021-05-11 ENCOUNTER — Ambulatory Visit
Admission: EM | Admit: 2021-05-11 | Discharge: 2021-05-11 | Disposition: A | Discharge status: Home / Self Care | Payer: Medicare Other | Attending: Family Medicine | Admitting: Family Medicine

## 2021-05-11 DIAGNOSIS — J22 Unspecified acute lower respiratory infection: Secondary | ICD-10-CM | POA: Diagnosis not present

## 2021-05-11 MED ORDER — DOXYCYCLINE HYCLATE 100 MG PO CAPS: 100.0000 mg | ORAL_CAPSULE | Freq: Two times a day (BID) | ORAL | 0 refills | Status: DC

## 2021-05-11 NOTE — ED Provider Notes (Signed)
Roderic Palau    CSN: [id number] Arrival date & time: 05/11/21  8657      History   Chief Complaint Chief Complaint  Patient presents with   Cough    HPI Kimberly Patton is a 65 y.o. female.   HPI Patient presents today with a week and a half of cough.  She was seen by her primary care provider which treated symptoms as viral and placed patient on anticough medication.  She reports that the symptoms appear to be improving up until 2 days ago when she developed a persistent cough and chest tightness and heaviness.  She has been without fever.  Denies any associated upper URI symptoms with the exception of ear fullness.  Denies any wheezing or shortness of breath.  Past Medical History:  Diagnosis Date   ALLERGIC RHINITIS 04/03/2007   Anemia    Anxiety    Asthma    hx of years ago - no longer a problem    ASTHMA, PERSISTENT, MODERATE 04/03/2007   Cancer ([place])    skin    Dyspnea on exertion    a. 11/2007 Echo: EF 60%.   Dysrhythmia    hx of heart arrhythmia 10-15 years ago - followed by DR Lovena Le - not seen in years    GERD (gastroesophageal reflux disease)    Hemophilia carrier    Hypertension    Joint pain    HIPS / LEGS   Knee injury    RT   Meningioma ([place])    Morbid obesity ([place])    Paroxysmal SVT (supraventricular tachycardia) ([place])    a. 11/2011 48h Holter: RSR, rare PVC's, occas PAC's   Retinal tear of right eye 01/2016   Ventral hernia     Patient Active Problem List   Diagnosis Date Noted   Atrophic vaginitis 03/13/2021   Tinnitus of both ears 07/21/2020   Cervical radiculopathy 06/28/2020   Chronic venous insufficiency 05/29/2020   Lymphedema 05/29/2020   CAD (coronary artery disease) 05/29/2020   Cervical lymphadenitis 04/18/2020   Dysuria 03/25/2020   History of repair of hiatal hernia 02/02/2020   Bradycardia 01/01/2020   Orthostatic hypotension 01/01/2020   Globus sensation 11/23/2019   Elevated lipase 08/27/2019   Abdominal pain,  epigastric 08/11/2019   Hypercholesteremia 06/09/2019   Chronic neuropathic pain 07/31/2018   Thyroid nodule 04/15/2018   Skin lesion of scalp 09/06/2017   Hypokalemia 06/08/2017   Osteoporosis 12/04/2016   Osteoarthritis of knee 07/17/2016   Numbness and tingling of both feet 01/27/2016   Meningioma ([place]) 01/18/2016   Family history of aortic stenosis 01/20/2015   Status post laparoscopic sleeve gastrectomy Dec 2014 06/08/2013   Morbid obesity ([place]) 12/08/2012   HTN (hypertension) 10/11/2011   Varicose veins of bilateral lower extremities with other complications 84/69/6295   Palpitations 09/12/2010   FIBROIDS, UTERUS 04/03/2007   Allergic rhinitis 04/03/2007   Asthma, mild intermittent 04/03/2007    Past Surgical History:  Procedure Laterality Date   APPENDECTOMY  1996   BREAST CYST EXCISION Right 1994   Benign   CESAREAN SECTION     x2   CHOLECYSTECTOMY  1995   COLONOSCOPY WITH PROPOFOL N/A 05/09/2021   Procedure: COLONOSCOPY WITH PROPOFOL;  Surgeon: [name], MD;  Location: [facility] ENDOSCOPY;  Service: Gastroenterology;  Laterality: N/A;   EYE SURGERY  01/2016   Repair retinal tear   FOOT SURGERY  1995 / 1996   x2   HERNIA REPAIR     KNEE ARTHROSCOPY W/ MENISCAL  REPAIR  07/25/13   right knee Dr. Mardelle Matte   LAPAROSCOPIC GASTRIC SLEEVE RESECTION N/A 06/08/2013   Procedure: LAPAROSCOPIC GASTRIC SLEEVE RESECTION AND EXCISION OF SEBACEUS CYST FROM MID CHEST takedown of incarcerated ventral hernia and primary repair endoscopy;  Surgeon: [name], MD;  Location: [facility] ORS;  Service: General;  Laterality: N/A;   LAPAROSCOPIC NISSEN FUNDOPLICATION N/A 5/63/8937   Procedure: LAPAROSCOPIC REPAIR LARGE SYMPTOMATIC HIATAL HERNIA WITH UPPER ENDOSCOPY;  Surgeon: Johnathan Hausen, MD;  Location: [facility] ORS;  Service: General;  Laterality: N/A;   moles  06/2013   removed 2 moles from under arm and  lowback   NECK SURGERY     occipital nerve damage- injections    OVARIAN CYST [place] Right 01/2016   [place]   SKIN CANCER EXCISION     TONSILLECTOMY     UPPER GI ENDOSCOPY  06/08/2013   Procedure: UPPER GI ENDOSCOPY;  Surgeon: [name], MD;  Location: [facility] ORS;  Service: General;;   US ECHOCARDIOGRAPHY  12/2011   WNL - EF 55-60%, mild MR, grade 1 diastolic dysnfiction (mild)   VENTRAL HERNIA REPAIR  2000    OB History     Gravida  3   Para  2   Term  2   Preterm      AB  1   Living  2      SAB  1   IAB      Ectopic      Multiple      Live Births  2            Home Medications    Prior to Admission medications   Medication Sig Start Date End Date Taking? Authorizing Provider  aspirin 81 MG chewable tablet Chew by mouth daily.   Yes [provider]  beclomethasone (QVAR REDIHALER) 40 MCG/ACT inhaler Inhale 2 puffs into the lungs 2 (two) times daily. 02/14/21  Yes Parrett, Tammy S, NP  Benfotiamine 150 MG CAPS Take by mouth.   Yes [provider]  benzonatate (TESSALON) 200 MG capsule Take 1 capsule (200 mg total) by mouth 2 (two) times daily as needed for cough. 05/09/21  Yes Bedsole, Amy E, MD  Calcium Citrate-Vitamin D (CITRACAL + D PO) Take 2 tablets by mouth daily.    Yes [provider]  Cyanocobalamin (VITAMIN B-12) 5000 MCG SUBL Place 5,000 mcg under the tongue daily.   Yes [provider]  diltiazem (CARDIZEM SR) 120 MG 12 hr capsule Take 1 capsule (120 mg total) by mouth 2 (two) times daily. Please make yearly appt with Dr. Quentin Ore for November 2022 for future refills. Thank you 1st attempt 05/10/21  Yes Vickie Epley, MD  doxycycline (VIBRAMYCIN) 100 MG capsule Take 1 capsule (100 mg total) by mouth 2 (two) times daily. 05/11/21  Yes Scot Jun, FNP  ELDERBERRY PO Take 1,000 mg by mouth daily.    Yes [provider]  fluticasone (FLONASE) 50 MCG/ACT nasal spray SPRAY 2 SPRAYS INTO EACH NOSTRIL EVERY DAY  04/17/21  Yes Bedsole, Amy E, MD  gabapentin (NEURONTIN) 300 MG capsule Take 300 mg by mouth 2 (two) times daily. 02/23/21  Yes [provider]  Lactobacillus Rhamnosus, GG, (CULTURELLE IMMUNITY SUPPORT PO) Take by mouth.   Yes [provider]  Multiple Vitamins-Minerals (CENTRUM SILVER 50+WOMEN PO) Take by mouth.   Yes [provider]  nitroGLYCERIN (  NITROSTAT) 0.4 MG SL tablet Place 0.4 mg under the tongue every 5 (five) minutes as needed for chest pain.   Yes [provider]  Omega-3 Fatty Acids (FISH OIL) 1200 MG CAPS Take by mouth.   Yes [provider]  pantoprazole (PROTONIX) 40 MG tablet Take 1 tablet (40 mg total) by mouth daily. 05/10/21  Yes Bedsole, Amy E, MD  Red Yeast Rice 600 MG CAPS Take by mouth.   Yes [provider]  rosuvastatin (CRESTOR) 10 MG tablet Take 1 tablet (10 mg total) by mouth daily. 04/13/21 07/12/21 Yes [name], MD  TURMERIC PO Take by mouth.   Yes [provider]  UBIQUINOL PO Take 100 mg by mouth daily at 12 noon.   Yes [provider]  vitamin C (ASCORBIC ACID) 250 MG tablet Take 250 mg by mouth daily.   Yes [provider]  guaiFENesin-codeine (ROBITUSSIN AC) 100-10 MG/5ML syrup Take 5-10 mLs by mouth at bedtime as needed for cough. 05/09/21   Jinny Sanders, MD    Family History Family History  Problem Relation Age of Onset   Breast cancer Mother    Diabetes Mother    Hypertension Mother    Kidney failure Mother    Diabetes Father    Hypertension Father    Diabetes Other    Hypertension Other    Stroke Other    Hemophilia Other     Social History Social History   Tobacco Use   Smoking status: Unknown   Smokeless tobacco: Never   Tobacco comments:    smoke at age 77-12  Vaping Use   Vaping Use: Never used  Substance Use Topics   Alcohol use: Not Currently    Alcohol/week: 0.0 standard drinks   Drug use: No     Allergies   Wasp venom, Chlorhexidine  gluconate, Metronidazole, Prednisone, and Sodium hypochlorite  Review of Systems Review of Systems Pertinent negatives listed in HPI   Physical Exam Triage Vital Signs ED Triage Vitals [05/11/21 1000]  Enc Vitals Group     BP 128/83     Pulse Rate 70     Resp 18     Temp 98.2 F (36.8 C)     Temp Source Oral     SpO2 98 %     Weight      Height      Head Circumference      Peak Flow      Pain Score      Pain Loc      Pain Edu?      Excl. in [place]?    No data found.  Updated Vital Signs BP 128/83 (BP Location: Left Arm)   Pulse 70   Temp 98.2 F (36.8 C) (Oral)   Resp 18   SpO2 98%   Visual Acuity Right Eye Distance:   Left Eye Distance:   Bilateral Distance:    Right Eye Near:   Left Eye Near:    Bilateral Near:     Physical Exam General appearance: alert, Ill-appearing, no distress Head: Normocephalic, without obvious abnormality, atraumatic ENT: Ears wnl TM, nares w/o congestion, oropharynx patent  Respiratory: Respirations even , unlabored, coarse lung sound, croupy persistent coughing throughout lung exam Heart: Rate and rhythm normal.  Extremities: No gross deformities Skin: Skin color, texture, turgor normal. No rashes seen  Psych: Appropriate mood and affect. Neurologic: No focal neurological abnormalities.   UC Treatments / Results  Labs (all labs ordered are listed,  but only abnormal results are displayed) Labs Reviewed - No data to display  EKG   Radiology No results found.  Procedures Procedures (including critical care time)  Medications Ordered in UC Medications - No data to display  Initial Impression / Assessment and Plan / UC Course  I have reviewed the triage vital signs and the nursing notes.  Pertinent labs & imaging results that were available during my care of the patient were reviewed by me and considered in my medical decision making (see chart for details).    Given comorbidities and presentation of symptoms  treating for acute lower respiratory tract infection.  Treating with doxycycline.  Patient can continue antitussive cough medication at home.  Follow-up with PCP as needed if symptoms do not readily improve. Final Clinical Impressions(s) / UC Diagnoses   Final diagnoses:  Acute lower respiratory tract infection   Discharge Instructions   None    ED Prescriptions     Medication Sig Dispense Auth. Provider   doxycycline (VIBRAMYCIN) 100 MG capsule Take 1 capsule (100 mg total) by mouth 2 (two) times daily. 20 capsule Scot Jun, FNP      PDMP not reviewed this encounter.   Scot Jun, FNP 05/11/21 1021

## 2021-05-11 NOTE — ED Triage Notes (Signed)
Pt c/o cough she had 1 week ago she was given cough suppressants from her PCP and it went away, but 2 days ago the cough returned and she feels it in her chest.

## 2021-05-12 NOTE — Telephone Encounter (Signed)
I called and spoke with the pt  She asks that we hold off on sending the budesonide nebs  She has not found out anything about the price and says will pay out of pocket for what she was originally prescribed if necc Nothing further needed at this time

## 2021-05-17 ENCOUNTER — Ambulatory Visit: Payer: Self-pay | Admitting: Surgery

## 2021-05-17 DIAGNOSIS — N6489 Other specified disorders of breast: Secondary | ICD-10-CM

## 2021-05-22 ENCOUNTER — Other Ambulatory Visit: Payer: Self-pay | Admitting: Surgery

## 2021-05-22 DIAGNOSIS — N6489 Other specified disorders of breast: Secondary | ICD-10-CM

## 2021-05-24 ENCOUNTER — Encounter: Payer: Self-pay | Admitting: Family Medicine

## 2021-05-31 DIAGNOSIS — L7211 Pilar cyst: Secondary | ICD-10-CM | POA: Diagnosis not present

## 2021-05-31 DIAGNOSIS — H2513 Age-related nuclear cataract, bilateral: Secondary | ICD-10-CM | POA: Diagnosis not present

## 2021-06-14 ENCOUNTER — Other Ambulatory Visit: Payer: Self-pay

## 2021-06-14 ENCOUNTER — Encounter (HOSPITAL_BASED_OUTPATIENT_CLINIC_OR_DEPARTMENT_OTHER): Payer: Self-pay | Admitting: Surgery

## 2021-06-21 ENCOUNTER — Other Ambulatory Visit: Payer: Self-pay

## 2021-06-21 ENCOUNTER — Encounter (HOSPITAL_BASED_OUTPATIENT_CLINIC_OR_DEPARTMENT_OTHER)
Admission: RE | Admit: 2021-06-21 | Discharge: 2021-06-21 | Disposition: A | Discharge status: Home / Self Care | Payer: Medicare Other | Source: Ambulatory Visit | Attending: Surgery | Admitting: Surgery

## 2021-06-21 ENCOUNTER — Ambulatory Visit
Admission: RE | Admit: 2021-06-21 | Discharge: 2021-06-21 | Disposition: A | Discharge status: Home / Self Care | Payer: Medicare Other | Source: Ambulatory Visit | Attending: Surgery | Admitting: Surgery

## 2021-06-21 DIAGNOSIS — Z01810 Encounter for preprocedural cardiovascular examination: Secondary | ICD-10-CM | POA: Insufficient documentation

## 2021-06-21 DIAGNOSIS — R928 Other abnormal and inconclusive findings on diagnostic imaging of breast: Secondary | ICD-10-CM | POA: Diagnosis not present

## 2021-06-21 DIAGNOSIS — N6489 Other specified disorders of breast: Secondary | ICD-10-CM

## 2021-06-21 MED ORDER — ENSURE PRE-SURGERY PO LIQD: 296.0000 mL | Freq: Once | ORAL | Status: DC

## 2021-06-21 NOTE — Progress Notes (Signed)

## 2021-06-23 ENCOUNTER — Encounter (HOSPITAL_BASED_OUTPATIENT_CLINIC_OR_DEPARTMENT_OTHER): Payer: Self-pay | Admitting: Surgery

## 2021-06-23 ENCOUNTER — Ambulatory Visit (HOSPITAL_BASED_OUTPATIENT_CLINIC_OR_DEPARTMENT_OTHER): Payer: Medicare Other | Admitting: Anesthesiology

## 2021-06-23 ENCOUNTER — Ambulatory Visit
Admission: RE | Admit: 2021-06-23 | Discharge: 2021-06-23 | Disposition: A | Discharge status: Home / Self Care | Payer: Medicare Other | Source: Ambulatory Visit | Attending: Surgery | Admitting: Surgery

## 2021-06-23 ENCOUNTER — Other Ambulatory Visit: Payer: Self-pay

## 2021-06-23 ENCOUNTER — Encounter (HOSPITAL_BASED_OUTPATIENT_CLINIC_OR_DEPARTMENT_OTHER)
Admission: RE | Disposition: A | Discharge status: Home / Self Care | Payer: Self-pay | Source: Ambulatory Visit | Attending: Surgery

## 2021-06-23 ENCOUNTER — Ambulatory Visit (HOSPITAL_BASED_OUTPATIENT_CLINIC_OR_DEPARTMENT_OTHER)
Admission: RE | Admit: 2021-06-23 | Discharge: 2021-06-23 | Disposition: A | Discharge status: Home / Self Care | Payer: Medicare Other | Source: Ambulatory Visit | Attending: Surgery | Admitting: Surgery

## 2021-06-23 DIAGNOSIS — N6489 Other specified disorders of breast: Secondary | ICD-10-CM

## 2021-06-23 DIAGNOSIS — N6011 Diffuse cystic mastopathy of right breast: Secondary | ICD-10-CM | POA: Insufficient documentation

## 2021-06-23 DIAGNOSIS — L7211 Pilar cyst: Secondary | ICD-10-CM | POA: Insufficient documentation

## 2021-06-23 DIAGNOSIS — I471 Supraventricular tachycardia: Secondary | ICD-10-CM | POA: Diagnosis not present

## 2021-06-23 DIAGNOSIS — I10 Essential (primary) hypertension: Secondary | ICD-10-CM | POA: Diagnosis not present

## 2021-06-23 DIAGNOSIS — I2510 Atherosclerotic heart disease of native coronary artery without angina pectoris: Secondary | ICD-10-CM | POA: Diagnosis not present

## 2021-06-23 DIAGNOSIS — N62 Hypertrophy of breast: Secondary | ICD-10-CM | POA: Diagnosis not present

## 2021-06-23 DIAGNOSIS — R928 Other abnormal and inconclusive findings on diagnostic imaging of breast: Secondary | ICD-10-CM | POA: Diagnosis not present

## 2021-06-23 HISTORY — PX: RADIOACTIVE SEED GUIDED EXCISIONAL BREAST BIOPSY: SHX6490

## 2021-06-23 SURGERY — RADIOACTIVE SEED GUIDED BREAST BIOPSY
Anesthesia: General | Site: Breast | Laterality: Right

## 2021-06-23 MED ORDER — OXYCODONE HCL 5 MG PO TABS
5.0000 mg | ORAL_TABLET | Freq: Once | ORAL | Status: AC | PRN
  Administered 2021-06-23: 5 mg via ORAL

## 2021-06-23 MED ORDER — ACETAMINOPHEN 500 MG PO TABS
ORAL_TABLET | ORAL | Status: AC
  Filled 2021-06-23: qty 2

## 2021-06-23 MED ORDER — CEFAZOLIN IN SODIUM CHLORIDE 3-0.9 GM/100ML-% IV SOLN
INTRAVENOUS | Status: AC
  Filled 2021-06-23: qty 100

## 2021-06-23 MED ORDER — PROPOFOL 10 MG/ML IV BOLUS
INTRAVENOUS | Status: DC | PRN
  Administered 2021-06-23: 200 mg via INTRAVENOUS

## 2021-06-23 MED ORDER — LACTATED RINGERS IV SOLN: INTRAVENOUS | Status: DC

## 2021-06-23 MED ORDER — FENTANYL CITRATE (PF) 100 MCG/2ML IJ SOLN
25.0000 ug | INTRAMUSCULAR | Status: DC | PRN
  Administered 2021-06-23 (×2): 50 ug via INTRAVENOUS

## 2021-06-23 MED ORDER — FENTANYL CITRATE (PF) 100 MCG/2ML IJ SOLN
INTRAMUSCULAR | Status: AC
  Filled 2021-06-23: qty 2

## 2021-06-23 MED ORDER — PHENYLEPHRINE 40 MCG/ML (10ML) SYRINGE FOR IV PUSH (FOR BLOOD PRESSURE SUPPORT)
PREFILLED_SYRINGE | INTRAVENOUS | Status: AC
  Filled 2021-06-23: qty 10

## 2021-06-23 MED ORDER — ACETAMINOPHEN 500 MG PO TABS
1000.0000 mg | ORAL_TABLET | ORAL | Status: AC
  Administered 2021-06-23: 1000 mg via ORAL

## 2021-06-23 MED ORDER — OXYCODONE HCL 5 MG PO TABS
ORAL_TABLET | ORAL | Status: AC
  Filled 2021-06-23: qty 1

## 2021-06-23 MED ORDER — OXYCODONE HCL 5 MG/5ML PO SOLN: 5.0000 mg | Freq: Once | ORAL | Status: AC | PRN

## 2021-06-23 MED ORDER — SCOPOLAMINE 1 MG/3DAYS TD PT72
1.0000 | MEDICATED_PATCH | TRANSDERMAL | Status: DC
  Administered 2021-06-23: 1.5 mg via TRANSDERMAL

## 2021-06-23 MED ORDER — BUPIVACAINE-EPINEPHRINE (PF) 0.25% -1:200000 IJ SOLN
INTRAMUSCULAR | Status: DC | PRN
  Administered 2021-06-23: 19 mL

## 2021-06-23 MED ORDER — EPHEDRINE SULFATE 50 MG/ML IJ SOLN
INTRAMUSCULAR | Status: DC | PRN
  Administered 2021-06-23: 10 mg via INTRAVENOUS

## 2021-06-23 MED ORDER — EPHEDRINE 5 MG/ML INJ
INTRAVENOUS | Status: AC
  Filled 2021-06-23: qty 5

## 2021-06-23 MED ORDER — ONDANSETRON HCL 4 MG/2ML IJ SOLN
INTRAMUSCULAR | Status: DC | PRN
  Administered 2021-06-23: 4 mg via INTRAVENOUS

## 2021-06-23 MED ORDER — GABAPENTIN 300 MG PO CAPS
300.0000 mg | ORAL_CAPSULE | ORAL | Status: AC
  Administered 2021-06-23: 300 mg via ORAL

## 2021-06-23 MED ORDER — GABAPENTIN 300 MG PO CAPS
ORAL_CAPSULE | ORAL | Status: AC
  Filled 2021-06-23: qty 1

## 2021-06-23 MED ORDER — ONDANSETRON HCL 4 MG/2ML IJ SOLN
INTRAMUSCULAR | Status: AC
  Filled 2021-06-23: qty 2

## 2021-06-23 MED ORDER — SCOPOLAMINE 1 MG/3DAYS TD PT72
MEDICATED_PATCH | TRANSDERMAL | Status: AC
  Filled 2021-06-23: qty 1

## 2021-06-23 MED ORDER — LIDOCAINE-EPINEPHRINE (PF) 1 %-1:200000 IJ SOLN
INTRAMUSCULAR | Status: AC
  Filled 2021-06-23: qty 30

## 2021-06-23 MED ORDER — PROPOFOL 10 MG/ML IV BOLUS
INTRAVENOUS | Status: AC
  Filled 2021-06-23: qty 20

## 2021-06-23 MED ORDER — LIDOCAINE 2% (20 MG/ML) 5 ML SYRINGE
INTRAMUSCULAR | Status: DC | PRN
  Administered 2021-06-23: 60 mg via INTRAVENOUS

## 2021-06-23 MED ORDER — 0.9 % SODIUM CHLORIDE (POUR BTL) OPTIME
TOPICAL | Status: DC | PRN
  Administered 2021-06-23: 75 mL

## 2021-06-23 MED ORDER — MIDAZOLAM HCL 2 MG/2ML IJ SOLN
INTRAMUSCULAR | Status: AC
  Filled 2021-06-23: qty 2

## 2021-06-23 MED ORDER — DEXAMETHASONE SODIUM PHOSPHATE 10 MG/ML IJ SOLN
INTRAMUSCULAR | Status: AC
  Filled 2021-06-23: qty 1

## 2021-06-23 MED ORDER — HYDROCODONE-ACETAMINOPHEN 5-325 MG PO TABS: 1.0000 | ORAL_TABLET | Freq: Four times a day (QID) | ORAL | 0 refills | Status: DC | PRN

## 2021-06-23 MED ORDER — AMISULPRIDE (ANTIEMETIC) 5 MG/2ML IV SOLN: 10.0000 mg | Freq: Once | INTRAVENOUS | Status: DC | PRN

## 2021-06-23 MED ORDER — BUPIVACAINE-EPINEPHRINE (PF) 0.25% -1:200000 IJ SOLN
INTRAMUSCULAR | Status: AC
  Filled 2021-06-23: qty 30

## 2021-06-23 MED ORDER — MIDAZOLAM HCL 5 MG/5ML IJ SOLN
INTRAMUSCULAR | Status: DC | PRN
  Administered 2021-06-23: 2 mg via INTRAVENOUS

## 2021-06-23 MED ORDER — PHENYLEPHRINE HCL (PRESSORS) 10 MG/ML IV SOLN
INTRAVENOUS | Status: DC | PRN
  Administered 2021-06-23 (×2): 80 ug via INTRAVENOUS
  Administered 2021-06-23: 40 ug via INTRAVENOUS
  Administered 2021-06-23: 80 ug via INTRAVENOUS

## 2021-06-23 MED ORDER — DEXAMETHASONE SODIUM PHOSPHATE 4 MG/ML IJ SOLN
INTRAMUSCULAR | Status: DC | PRN
  Administered 2021-06-23: 10 mg via INTRAVENOUS

## 2021-06-23 MED ORDER — CEFAZOLIN IN SODIUM CHLORIDE 3-0.9 GM/100ML-% IV SOLN
3.0000 g | INTRAVENOUS | Status: AC
  Administered 2021-06-23: 3 g via INTRAVENOUS

## 2021-06-23 MED ORDER — FENTANYL CITRATE (PF) 100 MCG/2ML IJ SOLN
INTRAMUSCULAR | Status: DC | PRN
  Administered 2021-06-23 (×2): 25 ug via INTRAVENOUS

## 2021-06-23 MED ORDER — LIDOCAINE HCL (PF) 1 % IJ SOLN
INTRAMUSCULAR | Status: AC
  Filled 2021-06-23: qty 30

## 2021-06-23 SURGICAL SUPPLY — 67 items
ADH SKN CLS APL DERMABOND .7 (GAUZE/BANDAGES/DRESSINGS) ×2
APL PRP STRL LF DISP 70% ISPRP (MISCELLANEOUS)
APL SKNCLS STERI-STRIP NONHPOA (GAUZE/BANDAGES/DRESSINGS)
APPLIER CLIP 9.375 MED OPEN (MISCELLANEOUS)
APR CLP MED 9.3 20 MLT OPN (MISCELLANEOUS)
BENZOIN TINCTURE PRP APPL 2/3 (GAUZE/BANDAGES/DRESSINGS) IMPLANT
BINDER BREAST 3XL (GAUZE/BANDAGES/DRESSINGS) ×2 IMPLANT
BINDER BREAST LRG (GAUZE/BANDAGES/DRESSINGS) IMPLANT
BINDER BREAST MEDIUM (GAUZE/BANDAGES/DRESSINGS) IMPLANT
BINDER BREAST XLRG (GAUZE/BANDAGES/DRESSINGS) IMPLANT
BINDER BREAST XXLRG (GAUZE/BANDAGES/DRESSINGS) IMPLANT
BLADE CLIPPER SURG (BLADE) IMPLANT
BLADE HEX COATED 2.75 (ELECTRODE) ×1 IMPLANT
BLADE SURG 15 STRL LF DISP TIS (BLADE) ×2 IMPLANT
BLADE SURG 15 STRL SS (BLADE) ×3
CANISTER SUCT 1200ML W/VALVE (MISCELLANEOUS) IMPLANT
CHLORAPREP W/TINT 26 (MISCELLANEOUS) ×1 IMPLANT
CLEANER CAUTERY TIP 5X5 PAD (MISCELLANEOUS) ×1 IMPLANT
CLIP APPLIE 9.375 MED OPEN (MISCELLANEOUS) IMPLANT
COVER BACK TABLE 60X90IN (DRAPES) ×3 IMPLANT
COVER MAYO STAND STRL (DRAPES) ×3 IMPLANT
COVER PROBE W GEL 5X96 (DRAPES) ×3 IMPLANT
DECANTER SPIKE VIAL GLASS SM (MISCELLANEOUS) ×1 IMPLANT
DERMABOND ADVANCED (GAUZE/BANDAGES/DRESSINGS) ×1
DERMABOND ADVANCED .7 DNX12 (GAUZE/BANDAGES/DRESSINGS) ×2 IMPLANT
DRAPE LAPAROTOMY 100X72 PEDS (DRAPES) ×3 IMPLANT
DRAPE U-SHAPE 76X120 STRL (DRAPES) IMPLANT
DRSG PAD ABDOMINAL 8X10 ST (GAUZE/BANDAGES/DRESSINGS) ×3 IMPLANT
ELECT COATED BLADE 2.86 ST (ELECTRODE) ×3 IMPLANT
ELECT REM PT RETURN 9FT ADLT (ELECTROSURGICAL) ×3
ELECTRODE REM PT RTRN 9FT ADLT (ELECTROSURGICAL) ×2 IMPLANT
GAUZE SPONGE 4X4 12PLY STRL LF (GAUZE/BANDAGES/DRESSINGS) ×3 IMPLANT
GLOVE SURG ENC MOIS LTX SZ8 (GLOVE) ×3 IMPLANT
GLOVE SURG POLYISO LF SZ7 (GLOVE) ×2 IMPLANT
GLOVE SURG UNDER POLY LF SZ7 (GLOVE) ×4 IMPLANT
GOWN STRL REUS W/ TWL LRG LVL3 (GOWN DISPOSABLE) ×2 IMPLANT
GOWN STRL REUS W/ TWL XL LVL3 (GOWN DISPOSABLE) ×3 IMPLANT
GOWN STRL REUS W/TWL LRG LVL3 (GOWN DISPOSABLE) ×3
GOWN STRL REUS W/TWL XL LVL3 (GOWN DISPOSABLE) ×6
KIT MARKER MARGIN INK (KITS) ×3 IMPLANT
NDL HYPO 25X1 1.5 SAFETY (NEEDLE) ×1 IMPLANT
NDL HYPO 27GX1-1/4 (NEEDLE) IMPLANT
NEEDLE HYPO 25X1 1.5 SAFETY (NEEDLE) ×3 IMPLANT
NEEDLE HYPO 27GX1-1/4 (NEEDLE) IMPLANT
NS IRRIG 1000ML POUR BTL (IV SOLUTION) ×3 IMPLANT
PACK BASIN DAY SURGERY FS (CUSTOM PROCEDURE TRAY) ×3 IMPLANT
PAD CLEANER CAUTERY TIP 5X5 (MISCELLANEOUS)
PENCIL SMOKE EVACUATOR (MISCELLANEOUS) ×3 IMPLANT
SHEET MEDIUM DRAPE 40X70 STRL (DRAPES) ×3 IMPLANT
SLEEVE SCD COMPRESS KNEE MED (STOCKING) ×1 IMPLANT
SLEEVE SCD COMPRESS KNEE XLRG (STOCKING) ×2 IMPLANT
SPONGE T-LAP 18X18 ~~LOC~~+RFID (SPONGE) ×3 IMPLANT
STRIP CLOSURE SKIN 1/2X4 (GAUZE/BANDAGES/DRESSINGS) IMPLANT
SUT ETHILON 3 0 FSL (SUTURE) IMPLANT
SUT ETHILON 5 0 PS 2 18 (SUTURE) IMPLANT
SUT MNCRL AB 4-0 PS2 18 (SUTURE) ×5 IMPLANT
SUT VIC AB 4-0 SH 18 (SUTURE) ×3 IMPLANT
SUT VIC AB 5-0 PS2 18 (SUTURE) IMPLANT
SUT VICRYL 3-0 CR8 SH (SUTURE) IMPLANT
SYR BULB EAR ULCER 3OZ GRN STR (SYRINGE) ×3 IMPLANT
SYR CONTROL 10ML LL (SYRINGE) ×3 IMPLANT
TOWEL GREEN STERILE FF (TOWEL DISPOSABLE) ×3 IMPLANT
TRAY DSU PREP LF (CUSTOM PROCEDURE TRAY) ×3 IMPLANT
TRAY FAXITRON CT DISP (TRAY / TRAY PROCEDURE) ×3 IMPLANT
TUBE CONNECTING 20X1/4 (TUBING) IMPLANT
UNDERPAD 30X36 HEAVY ABSORB (UNDERPADS AND DIAPERS) ×2 IMPLANT
YANKAUER SUCT BULB TIP NO VENT (SUCTIONS) IMPLANT

## 2021-06-23 NOTE — Anesthesia Procedure Notes (Signed)
Procedure Name: LMA Insertion Date/Time: 06/23/2021 10:08 AM Performed by: Ezequiel Kayser, CRNA Pre-anesthesia Checklist: Patient identified, Emergency Drugs available, Suction available and Patient being monitored Patient Re-evaluated:Patient Re-evaluated prior to induction Oxygen Delivery Method: Circle System Utilized Preoxygenation: Pre-oxygenation with 100% oxygen Induction Type: IV induction Ventilation: Mask ventilation without difficulty LMA: LMA inserted LMA Size: 4.0 Number of attempts: 1 Airway Equipment and Method: Bite block Placement Confirmation: positive ETCO2 Tube secured with: Tape Dental Injury: Teeth and Oropharynx as per pre-operative assessment

## 2021-06-23 NOTE — Discharge Instructions (Signed)

## 2021-06-23 NOTE — Interval H&P Note (Signed)
History and Physical Interval Note:  06/23/2021 9:55 AM  Kimberly Patton  has presented today for surgery, with the diagnosis of RIGHT BREAST COMPLEX SCLEROSING LESION.  The various methods of treatment have been discussed with the patient and family. After consideration of risks, benefits and other options for treatment, the patient has consented to  Procedure(s): RADIOACTIVE SEED GUIDED EXCISIONAL RIGHT BREAST BIOPSY (Right) REMOVAL OF PILAR CYST OF THE LEFT SCALP (Left) as a surgical intervention.  The patient's history has been reviewed, patient examined, no change in status, stable for surgery.  I have reviewed the patient's chart and labs.  Questions were answered to the patient's satisfaction.     [name]

## 2021-06-23 NOTE — Op Note (Signed)
AVONLEA SIMA  [birth year]   06/23/2021    PCP:  Jinny Sanders, MD   Surgeon: Kaylyn Lim, MD, FACS  Asst:  none  Anes:  general  Preop Dx: Complex sclerosising lesion of the right medial breast Postop Dx: same  Procedure: Radioactive seed guided right breast biopsy Location Surgery: CDS 1 Complications: None noted  EBL:   minimal cc  Drains: none  Description of Procedure:  The patient was taken to OR 1 .  After anesthesia was administered and the patient was prepped  with Betadine and a timeout was performed.  Mapping it.  Then performed before prepping and the area in the right medial inferior quadrant was mapped again and marked with the highest counts.  I then examined the breast and this was a ptotic breast.  I planned in an incision that would excise a portion of the overlying skin to help with orientation and then carried this down and let the radioactive signal guide the remainder of the biopsy.  The radiologist report did not give any guidance is terms of the relationship of the seed to the marker.  I elected to go wider medially and come down keeping the counts within the excised specimen.  I then created a similar flap laterally and came into the breast as well.  There was no palpable mass to speak of that accompanied this localization.  Patient has a very fatty breast tissue.  I completed the excision and marked the specimen with the provided paints.  I then irrigated well and then injected with some quarter percent Marcaine with epinephrine.  I repalpated the depths of the incision for any suspicious areas and found none.  The initial specimen mammogram confirmed the presence of the seed.  I was unable to see an opaque marker.  I took specimens inferior and superior to the area around the radioactive seed but those were not seen to contain the marker.  The incision was closed in layers with 4-0 Vicryl and then with five 4-0 Monocryl on the skin and Dermabond.  A breast  binder was placed.  Patient tolerated procedure well was taken recovery room in satisfactory condition.  [name] Done, [place], [facility], [place]

## 2021-06-23 NOTE — H&P (Signed)
REFERRING PHYSICIAN: Aneta Mins,*  PROVIDER: Joya San, MD  MRN: [id number] DOB: [birth year] DATE OF ENCOUNTER: 05/17/2021  Subjective   Chief Complaint: New Consultation (R breast complex sclerosing lesion)   History of Present Illness: Kimberly Patton is a 66 y.o. female who is seen today as an office consultation at the request of Dr. Diona Browner for evaluation of New Consultation (R breast complex sclerosing lesion) .   She had a recent biopsy of a suspicious area in the right lower inferior breast medially that revealed a complex sclerosing lesion. She discussed this with me and the recommendation is to go ahead and remove it with a seed localization. I can do this at [facility] day surgery. She would like to wait till after the holidays. She also has a palpable pilar cyst on the top of her head about the level of her ear on the left side. We could remove this as well after we do her breast biopsy. I have gone ahead and put in orders in epic for her to have this procedure and we will plan on scheduling it early in 2023  Review of Systems: See HPI as well for other ROS.  ROS   Medical History: Past Medical History:  Diagnosis Date   GERD (gastroesophageal reflux disease)   Hypertension   Patient Active Problem List  Diagnosis   Tingling   Chronic tension-type headache, not intractable   Muscle strain   Allergic rhinitis   Asthma, mild intermittent   Chronic neuropathic pain   Ear pain, right   HTN (hypertension)   Hypercholesteremia   Neck swelling   Numbness and tingling of both feet   Osteoarthritis of knee   Body mass index (BMI) 40.0-44.9, adult (CMS-HCC)   Bradycardia   Globus sensation   History of repair of hiatal hernia   Meningioma (CMS-HCC)   Status post laparoscopic sleeve gastrectomy   Varicose veins of bilateral lower extremities with other complications   Tinnitus of both ears   Thyroid nodule   Past Surgical History:  Procedure  Laterality Date   CESAREAN SECTION   EGD 12/23/2019  Normal EGD biopsy/No Repeat/CTL   HERNIA REPAIR   KNEE ARTHROSCOPY Bilateral    Allergies  Allergen Reactions   Wasp Venom Anaphylaxis   Chlorhexidine Gluconate Other (See Comments)  Itching under breasts   Metronidazole Other (See Comments)  Chest tightness, neck tightness   Prednisone Other (See Comments)  Severe migraine / with taper dose   Bleach (Sodium Hypochlorite) Rash  Liquid clorox bleach   Sodium Hypochlorite Rash  Liquid clorox bleach   Current Outpatient Medications on File Prior to Visit  Medication Sig Dispense Refill   azelastine (ASTELIN) 137 mcg nasal spray Place 1 spray into both nostrils 2 (two) times daily (Patient not taking: Reported on 12/15/2019 ) 10 mL 1   benzonatate (TESSALON) 200 MG capsule Take 1 capsule (200 mg total) by mouth 3 (three) times daily as needed for Cough (Patient not taking: Reported on 12/11/2019 ) 30 capsule 1   calcium carbonate (CALCIUM 600) 600 mg calcium (1,500 mg) Tab tablet Take 600 mg by mouth once daily.   cholecalciferol, vitamin D3, 400 unit Cap Take by mouth.   CYANOCOBALAMIN, VITAMIN B-12, SL Place under the tongue.   diltiazem (CARDIZEM CD) 180 MG CD capsule TAKE 1 CAPSULE (180 MG TOTAL) BY MOUTH DAILY.   diphenhydrAMINE HCl 2 % Gel Apply 1 Application topically 2 (two) times daily 1 Tube 0   famotidine (PEPCID)  20 MG tablet Take 1 tablet (20 mg total) by mouth 2 (two) times daily 180 tablet 0   fluticasone (FLONASE) 50 mcg/actuation nasal spray   hydroCHLOROthiazide (HYDRODIURIL) 25 MG tablet TAKE 1 TABLET BY MOUTH EVERY DAY   hydrocodone-chlorpheniramine (TUSSIONEX) 10-8 mg/5 mL ER suspension Take 5 mLs by mouth every 12 (twelve) hours as needed for Cough (Patient not taking: Reported on 12/15/2019 ) 120 mL 0   lisinopriL (ZESTRIL) 10 MG tablet Take 10 mg by mouth once daily   losartan (COZAAR) 25 MG tablet losartan 25 mg tablet TAKE 1 TABLET BY MOUTH EVERY DAY    mecobalamin/folic acid (OPURITY SL) Opurity   nortriptyline (PAMELOR) 10 MG capsule Start Nortriptyline (Pamelor) 10 mg nightly for one week, then increase to 20 mg nightly (Patient not taking: Reported on 12/15/2019 ) 60 capsule 3   pantoprazole (PROTONIX) 40 MG DR tablet TAKE 1 TABLET BY MOUTH EVERY DAY   potassium chloride (KLOR-CON M20) 20 MEQ ER tablet Klor-Con M20 mEq tablet,extended release TAKE 1 TABLET BY MOUTH EVERY DAY   traMADol (ULTRAM) 50 mg tablet   turm-ging-bos-yuc-wil-cham-hor 100-100-100-125 mg Tab Take by mouth once daily.   No current facility-administered medications on file prior to visit.   Family History  Problem Relation Age of Onset   Breast cancer Mother   Diabetes type I Mother   Diabetes type I Father   High blood pressure (Hypertension) Father   Diabetes type I Sister   High blood pressure (Hypertension) Sister   Diabetes type I Brother   High blood pressure (Hypertension) Brother   High blood pressure (Hypertension) Maternal Aunt    Social History   Tobacco Use  Smoking Status Never  Smokeless Tobacco Never    Social History   Socioeconomic History   Marital status: Divorced  Tobacco Use   Smoking status: Never   Smokeless tobacco: Never  Vaping Use   Vaping Use: Never used  Substance and Sexual Activity   Alcohol use: Yes  Comment: Rarely   Drug use: No   Sexual activity: Not Currently  Partners: Male   Objective:   Vitals:  05/17/21 1343  BP: 124/78  Pulse: 88  SpO2: 95%  Weight: (!) 124.3 kg (274 lb)  Height: 167.6 cm (5\' 6" )   Body mass index is 44.22 kg/m.  Physical Exam General: This slightly obese white female in no acute distress HEENT : There is a about a 1 cm palpable soft nodule in the left scalp above about the level of the ear on the top that is consistent with a pilar cyst Chest: Clear Heart: Sinus rhythm Breast: Biopsy site was in the right inferior inner quadrant. There is no palpable mass or tenderness  noted. Abdomen: Prior surgery by me including a sleeve gastrectomy GU unremarkable Rectal not performed Extremities full range of motion Neuro alert and oriented x3. Motor and sensory function grossly intact  Labs, Imaging and Diagnostic Testing: Path report as well as mammogram reports were reviewed  Assessment and Plan:   Complex sclerosing lesion of right breast Pilar cyst of the head-removed by Dr. Rozann Lesches in Dec   We have discussed removal of the lesion in her right breast with radioactive seed localization. In addition I will remove the pilar cyst from her head under the same anesthesia. She says that she will oftentimes get headaches which radiate up to the cyst cause her much discomfort.  In OR posting sheet has been placed and orders have been placed in epic for  her seed localization of the right breast.   Elvin Banker Donia Pounds, MD

## 2021-06-23 NOTE — Transfer of Care (Signed)
Immediate Anesthesia Transfer of Care Note  Patient: Kimberly Patton  Procedure(s) Performed: RADIOACTIVE SEED GUIDED EXCISIONAL RIGHT BREAST BIOPSY (Right: Breast)  Patient Location: PACU  Anesthesia Type:General  Level of Consciousness: drowsy  Airway & Oxygen Therapy: Patient Spontanous Breathing and Patient connected to face mask oxygen  Post-op Assessment: Report given to RN and Post -op Vital signs reviewed and stable  Post vital signs: Reviewed and stable  Last Vitals:  Vitals Value Taken Time  BP    Temp    Pulse 78 06/23/21 1131  Resp 14 06/23/21 1131  SpO2 100 % 06/23/21 1131  Vitals shown include unvalidated device data.  Last Pain:  Vitals:   06/23/21 0839  TempSrc: Oral  PainSc: 0-No pain      Patients Stated Pain Goal: 3 (62/69/48 5462)  Complications: No notable events documented.

## 2021-06-23 NOTE — Anesthesia Preprocedure Evaluation (Signed)
Anesthesia Evaluation  Patient identified by MRN, date of birth, ID band Patient awake    Reviewed: Allergy & Precautions, NPO status , Patient's Chart, lab work & pertinent test results  Airway Mallampati: II  TM Distance: >3 FB Neck ROM: Full    Dental  (+) Dental Advisory Given   Pulmonary asthma ,    breath sounds clear to auscultation       Cardiovascular hypertension, Pt. on medications + CAD  + dysrhythmias Supra Ventricular Tachycardia  Rhythm:Regular Rate:Normal     Neuro/Psych  Neuromuscular disease    GI/Hepatic Neg liver ROS, GERD  ,  Endo/Other  Morbid obesity  Renal/GU negative Renal ROS     Musculoskeletal  (+) Arthritis ,   Abdominal   Peds  Hematology negative hematology ROS (+)   Anesthesia Other Findings   Reproductive/Obstetrics                             Anesthesia Physical Anesthesia Plan  ASA: 3  Anesthesia Plan: General   Post-op Pain Management: Tylenol PO (pre-op), Toradol IV (intra-op) and Minimal or no pain anticipated   Induction:   PONV Risk Score and Plan: 3 and Dexamethasone, Ondansetron, Scopolamine patch - Pre-op and Treatment may vary due to age or medical condition  Airway Management Planned: LMA  Additional Equipment: None  Intra-op Plan:   Post-operative Plan: Extubation in OR  Informed Consent: I have reviewed the patients History and Physical, chart, labs and discussed the procedure including the risks, benefits and alternatives for the proposed anesthesia with the patient or authorized representative who has indicated his/her understanding and acceptance.     Dental advisory given  Plan Discussed with: CRNA  Anesthesia Plan Comments:         Anesthesia Quick Evaluation

## 2021-06-23 NOTE — Anesthesia Postprocedure Evaluation (Signed)
Anesthesia Post Note  Patient: Kimberly Patton  Procedure(s) Performed: RADIOACTIVE SEED GUIDED EXCISIONAL RIGHT BREAST BIOPSY (Right: Breast)     Patient location during evaluation: PACU Anesthesia Type: General Level of consciousness: awake and alert Pain management: pain level controlled Vital Signs Assessment: post-procedure vital signs reviewed and stable Respiratory status: spontaneous breathing, nonlabored ventilation, respiratory function stable and patient connected to nasal cannula oxygen Cardiovascular status: blood pressure returned to baseline and stable Postop Assessment: no apparent nausea or vomiting Anesthetic complications: no   No notable events documented.  Last Vitals:  Vitals:   06/23/21 1231 06/23/21 1248  BP: (!) 106/55 119/62  Pulse: 71 67  Resp: 18 18  Temp:  (!) 36.4 C  SpO2: 95% 94%    Last Pain:  Vitals:   06/23/21 1248  TempSrc: Oral  PainSc: 3                  Tiajuana Amass

## 2021-06-26 ENCOUNTER — Encounter (HOSPITAL_BASED_OUTPATIENT_CLINIC_OR_DEPARTMENT_OTHER): Payer: Self-pay | Admitting: Surgery

## 2021-06-26 LAB — SURGICAL PATHOLOGY

## 2021-06-27 ENCOUNTER — Other Ambulatory Visit: Payer: Self-pay | Admitting: Cardiology

## 2021-06-27 MED ORDER — DILTIAZEM HCL ER 120 MG PO CP12: 120.0000 mg | ORAL_CAPSULE | Freq: Two times a day (BID) | ORAL | 0 refills | Status: DC

## 2021-06-27 NOTE — Telephone Encounter (Signed)
Please advise If ok to refill Dilitiazem 120 mg for 90 days? Pt would like to see Dr. Garen Lah before scheduling future appointment with Dr. Quentin Ore. Pt due for f/u with Dr. Quentin Ore.

## 2021-06-27 NOTE — Telephone Encounter (Signed)
Please refill for 1 month. Patient has appointment with Dr. Garen Lah on 07/24/21.

## 2021-06-27 NOTE — Telephone Encounter (Signed)
°*  STAT* If patient is at the pharmacy, call can be transferred to refill team.   1. Which medications need to be refilled? (please list name of each medication and dose if known) diltiazem 120 MG 12HR capsule  2. Which pharmacy/location (including street and city if local pharmacy) is medication to be sent to? [facility] [address]   3. Do they need a 30 day or 90 day supply? 90 day    Patient wants to speak with Dr Garen Lah before scheduling with Dr Quentin Ore again

## 2021-07-04 DIAGNOSIS — L820 Inflamed seborrheic keratosis: Secondary | ICD-10-CM | POA: Diagnosis not present

## 2021-07-04 DIAGNOSIS — L7211 Pilar cyst: Secondary | ICD-10-CM | POA: Diagnosis not present

## 2021-07-10 ENCOUNTER — Telehealth (INDEPENDENT_AMBULATORY_CARE_PROVIDER_SITE_OTHER): Payer: Self-pay | Admitting: *Deleted

## 2021-07-10 NOTE — Telephone Encounter (Signed)
Called patient to schedule her for the bilateral laser procedure the provider wants her to have and she was not ready to schedule. She was very upset because she has waited since October. I told her I totally understand and she will call us back if she wants to move forward.

## 2021-07-19 ENCOUNTER — Telehealth (INDEPENDENT_AMBULATORY_CARE_PROVIDER_SITE_OTHER): Payer: Self-pay | Admitting: *Deleted

## 2021-07-19 ENCOUNTER — Other Ambulatory Visit (INDEPENDENT_AMBULATORY_CARE_PROVIDER_SITE_OTHER): Payer: Self-pay | Admitting: Nurse Practitioner

## 2021-07-19 MED ORDER — ALPRAZOLAM 0.5 MG PO TABS: ORAL_TABLET | ORAL | 0 refills | Status: DC

## 2021-07-19 NOTE — Telephone Encounter (Signed)
Order sent with first fill on 08/10/2021

## 2021-07-19 NOTE — Telephone Encounter (Signed)
Opened in error

## 2021-07-19 NOTE — Telephone Encounter (Signed)
Patient scheduled for her laser procedure 08/17/21 and she is aware. Information sent by mail.

## 2021-07-20 ENCOUNTER — Other Ambulatory Visit: Payer: Self-pay

## 2021-07-20 MED ORDER — DILTIAZEM HCL ER 120 MG PO CP12: 120.0000 mg | ORAL_CAPSULE | Freq: Two times a day (BID) | ORAL | 1 refills | Status: DC

## 2021-07-23 ENCOUNTER — Encounter: Payer: Self-pay | Admitting: Family Medicine

## 2021-07-24 ENCOUNTER — Ambulatory Visit (INDEPENDENT_AMBULATORY_CARE_PROVIDER_SITE_OTHER): Payer: Medicare Other | Admitting: Cardiology

## 2021-07-24 ENCOUNTER — Encounter: Payer: Self-pay | Admitting: Cardiology

## 2021-07-24 ENCOUNTER — Other Ambulatory Visit: Payer: Self-pay

## 2021-07-24 VITALS — BP 132/80 | HR 73 | Ht 66.0 in | Wt 272.0 lb

## 2021-07-24 DIAGNOSIS — I10 Essential (primary) hypertension: Secondary | ICD-10-CM | POA: Diagnosis not present

## 2021-07-24 DIAGNOSIS — I471 Supraventricular tachycardia: Secondary | ICD-10-CM | POA: Diagnosis not present

## 2021-07-24 DIAGNOSIS — E7800 Pure hypercholesterolemia, unspecified: Secondary | ICD-10-CM

## 2021-07-24 MED ORDER — ROSUVASTATIN CALCIUM 10 MG PO TABS: 10.0000 mg | ORAL_TABLET | Freq: Every day | ORAL | 3 refills | Status: DC

## 2021-07-24 NOTE — Progress Notes (Signed)
Cardiology Office Note:    Date:  07/24/2021   ID:  Kimberly Patton, DOB 1955/10/04, MRN [id number]  PCP:  Kimberly Sanders, MD  [facility] Cardiologist:  [name], MD  [facility] Electrophysiologist:  None   Referring MD: Kimberly Sanders, MD   Chief Complaint  Patient presents with   Other    6 month follow up -- patient c.o SOB but her thyroid nodule has grown. Meds reviewed verbally with patient.     History of Present Illness:    Kimberly Patton is a 66 y.o. female with a hx of hypertension, hiatal hernia, GERD, iron deficiency anemia, SVT on diltiazem presenting for follow-up.  Being seen for hypertension and SVT.  Patient has been through a lot over the past 3 months.  Diagnosed with a breast mass, underwent surgery, noncancerous.  Also had colonoscopy showing polyps, these were resected did not show cancer.  She has been having some throat issues, has a history of goiter/thyroid nodule which has increased in size.  Plans to follow-up with PCP and endocrinology regarding this.  She denies chest pain or palpitations, blood pressures have been well controlled.  Blood pressures get elevated whenever she gets stressed.  Otherwise has no concerns.  Has been trying to lose weight which has been difficult of late.   Prior notes Echocardiogram 01/2020 showed normal systolic and diastolic function, EF 60 to 65%.  Lexiscan Myoview 01/2020 with no evidence for ischemia.  Has a history of hypokalemia while taking HCTZ. Losartan was started after stopping HCTZ. Developed nausea with losartan. This was stopped.   Past Medical History:  Diagnosis Date   ALLERGIC RHINITIS 04/03/2007   Anemia    Anxiety    Asthma    hx of years ago - no longer a problem    ASTHMA, PERSISTENT, MODERATE 04/03/2007   Cancer ([place])    skin    Dyspnea on exertion    a. 11/2007 Echo: EF 60%.   Dysrhythmia    hx of heart arrhythmia 10-15 years ago - followed by DR Lovena Le - not seen in years     GERD (gastroesophageal reflux disease)    Hemophilia carrier    Hypertension    Joint pain    HIPS / LEGS   Knee injury    RT   Meningioma ([place])    Morbid obesity (HCC)    Paroxysmal SVT (supraventricular tachycardia) ([place])    a. 11/2011 48h Holter: RSR, rare PVC's, occas PAC's   Retinal tear of right eye 01/2016   Ventral hernia     Past Surgical History:  Procedure Laterality Date   APPENDECTOMY  1996   BREAST CYST EXCISION Right 1994   Benign   CESAREAN SECTION     x2   CHOLECYSTECTOMY  1995   COLONOSCOPY WITH PROPOFOL N/A 05/09/2021   Procedure: COLONOSCOPY WITH PROPOFOL;  Surgeon: [name], MD;  Location[facility];  Service: Gastroenterology;  Laterality: N/A;   EYE SURGERY  01/2016   Repair retinal tear   FOOT SURGERY  1995 / 1996   x2   HERNIA REPAIR     KNEE ARTHROSCOPY W/ MENISCAL REPAIR  07/25/13   right knee Dr. Mardelle Matte   LAPAROSCOPIC GASTRIC SLEEVE RESECTION N/A 06/08/2013   Procedure: LAPAROSCOPIC GASTRIC SLEEVE RESECTION AND EXCISION OF SEBACEUS CYST FROM MID CHEST takedown of incarcerated ventral hernia and primary repair endoscopy;  Surgeon: [name], MD;  Location: [facility] ORS;  Service: General;  Laterality: N/A;  LAPAROSCOPIC NISSEN FUNDOPLICATION N/A 5/73/2202   Procedure: LAPAROSCOPIC REPAIR LARGE SYMPTOMATIC HIATAL HERNIA WITH UPPER ENDOSCOPY;  Surgeon: Johnathan Hausen, MD;  Location: [facility] ORS;  Service: General;  Laterality: N/A;   moles  06/2013   removed 2 moles from under arm and  lowback   NECK SURGERY     occipital nerve damage- injections    OVARIAN CYST [place] EXCISIONAL BREAST BIOPSY Right 06/23/2021   Procedure: RADIOACTIVE SEED GUIDED EXCISIONAL RIGHT BREAST BIOPSY;  Surgeon: Johnathan Hausen, MD;  Location: [place];  Service: General;  Laterality: Right;   RETINAL TEAR REPAIR CRYOTHERAPY Right 01/2016   [place]   SKIN CANCER EXCISION     TONSILLECTOMY      UPPER GI ENDOSCOPY  06/08/2013   Procedure: UPPER GI ENDOSCOPY;  Surgeon: [name], MD;  Location: [facility];  Service: General;;   US ECHOCARDIOGRAPHY  12/2011   WNL - EF 55-60%, mild MR, grade 1 diastolic dysnfiction (mild)   VENTRAL HERNIA REPAIR  2000    Current Medications: Current Meds  Medication Sig   [START ON 08/10/2021] ALPRAZolam (XANAX) 0.5 MG tablet Take one tab one hour prior to procedure and one tab when you arrive in office   aspirin 81 MG chewable tablet Chew by mouth daily.   beclomethasone (QVAR REDIHALER) 40 MCG/ACT inhaler Inhale 2 puffs into the lungs 2 (two) times daily.   Benfotiamine 150 MG CAPS Take by mouth.   Calcium Citrate-Vitamin D (CITRACAL + D PO) Take 2 tablets by mouth daily.    Cyanocobalamin (VITAMIN B-12) 5000 MCG SUBL Place 5,000 mcg under the tongue daily.   diltiazem (CARDIZEM SR) 120 MG 12 hr capsule Take 1 capsule (120 mg total) by mouth 2 (two) times daily.   ELDERBERRY PO Take 1,000 mg by mouth daily.    fluticasone (FLONASE) 50 MCG/ACT nasal spray SPRAY 2 SPRAYS INTO EACH NOSTRIL EVERY DAY   gabapentin (NEURONTIN) 300 MG capsule Take 300 mg by mouth 2 (two) times daily.   HYDROcodone-acetaminophen (NORCO/VICODIN) 5-325 MG tablet Take 1 tablet by mouth every 6 (six) hours as needed for moderate pain.   Lactobacillus Rhamnosus, GG, (CULTURELLE IMMUNITY SUPPORT PO) Take by mouth.   Multiple Vitamins-Minerals (CENTRUM SILVER 50+WOMEN PO) Take by mouth.   nitroGLYCERIN (NITROSTAT) 0.4 MG SL tablet Place 0.4 mg under the tongue every 5 (five) minutes as needed for chest pain.   Omega-3 Fatty Acids (FISH OIL) 1200 MG CAPS Take by mouth.   pantoprazole (PROTONIX) 40 MG tablet Take 1 tablet (40 mg total) by mouth daily.   TURMERIC PO Take by mouth.   UBIQUINOL PO Take 100 mg by mouth daily at 12 noon.   vitamin C (ASCORBIC ACID) 250 MG tablet Take 250 mg by mouth daily.     Allergies:   Wasp venom, Chlorhexidine gluconate, Metronidazole,  Prednisone, and Sodium hypochlorite   Social History   Socioeconomic History   Marital status: Divorced    Spouse name: Not on file   Number of children: 2   Years of education: 17   Highest education level: Not on file  Occupational History   Occupation: Product manager: [employer]  Tobacco Use   Smoking status: Unknown   Smokeless tobacco: Never   Tobacco comments:    smoke at age [age]  Vaping Use   Vaping Use: Never used  Substance and Sexual Activity  Alcohol use: Not Currently    Alcohol/week: 0.0 standard drinks   Drug use: No   Sexual activity: Not on file  Other Topics Concern   Not on file  Social History Narrative   Lives at home alone.   Right-handed.   Occasional caffeine use.   Social Determinants of Health   Financial Resource Strain: Not on file  Food Insecurity: Not on file  Transportation Needs: Not on file  Physical Activity: Not on file  Stress: Not on file  Social Connections: Not on file     Family History: The patient's family history includes Breast cancer in her mother; Diabetes in her father, mother, and another family member; Hemophilia in an other family member; Hypertension in her father, mother, and another family member; Kidney failure in her mother; Stroke in an other family member.  ROS:   Please see the history of present illness.     All other systems reviewed and are negative.  EKGs/Labs/Other Studies Reviewed:    The following studies were reviewed today:   EKG:  EKG is  ordered today.  The ekg ordered today demonstrates normal sinus rhythm, normal ECG. Recent Labs: 10/17/2020: ALT 16; Hemoglobin 13.1; Platelets 293 02/24/2021: BUN 22; Creatinine, Ser 0.46; Potassium 4.2; Sodium 140  Recent Lipid Panel    Component Value Date/Time   CHOL 222 (H) 02/24/2021 0719   TRIG 65 02/24/2021 0719   HDL 77 02/24/2021 0719   CHOLHDL 2.9 02/24/2021 0719   VLDL 13 02/24/2021 0719   LDLCALC 132 (H) 02/24/2021 0719     Physical Exam:    VS:  BP 132/80 (BP Location: Left Arm, Patient Position: Sitting, Cuff Size: Normal)    Pulse 73    Ht 5\' 6"  (1.676 m)    Wt 272 lb (123.4 kg)    SpO2 96%    BMI 43.90 kg/m     Wt Readings from Last 3 Encounters:  07/24/21 272 lb (123.4 kg)  06/23/21 274 lb 0.5 oz (124.3 kg)  05/09/21 270 lb (122.5 kg)     GEN:  Well nourished, well developed in no acute distress, obese HEENT: Normal NECK: No JVD; No carotid bruits LYMPHATICS: No lymphadenopathy CARDIAC: RRR, no murmurs, rubs, gallops RESPIRATORY:  Clear to auscultation without rales, wheezing or rhonchi  ABDOMEN: Soft, non-tender, non-distended MUSCULOSKELETAL: Lower extremity lymphedema noted SKIN: Warm and dry NEUROLOGIC:  Alert and oriented x 3 PSYCHIATRIC:  Normal affect   ASSESSMENT:    1. Primary hypertension   2. Paroxysmal SVT (supraventricular tachycardia) (HCC)   3. Pure hypercholesterolemia   4. Morbid obesity ([place])      PLAN:    In order of problems listed above:   hypertension.  BP controlled. Continue Cardizem SR 120 mg twice daily. paroxysmal SVT, symptoms controlled, continue Cardizem SR 120 mg twice daily. Hyperlipidemia, continue 10 mg daily, obtain fasting lipid profile. Morbid obesity, low-calorie diet, weight loss advised  Follow-up yearly.  Total encounter time more than 35 minutes  Greater than 50% was spent in counseling and coordination of care with the patient   This note was generated in part or whole with voice recognition software. Voice recognition is usually quite accurate but there are transcription errors that can and very often do occur. I apologize for any typographical errors that were not detected and corrected.  Medication Adjustments/Labs and Tests Ordered: Current medicines are reviewed at length with the patient today.  Concerns regarding medicines are outlined above.  Orders Placed This Encounter  Procedures   Lipid panel    Meds ordered this  encounter  Medications   rosuvastatin (CRESTOR) 10 MG tablet    Sig: Take 1 tablet (10 mg total) by mouth daily.    Dispense:  90 tablet    Refill:  3     Patient Instructions  Medication Instructions:  Your physician recommends that you continue on your current medications as directed. Please refer to the Current Medication list given to you today.  *If you need a refill on your cardiac medications before your next appointment, please call your pharmacy*   Lab Work:  Your physician recommends that you return for a FASTING lipid profile: at your earliest [place] will need to be fasting. Please do not have anything to eat or drink after midnight the morning you have the lab work. You may only have water or black coffee with no cream or sugar.   Please return to our office on __________________________at________________am/pm   Testing/Procedures: None ordered   Follow-Up: At [facility], you and your health needs are our priority.  As part of our continuing mission to provide you with exceptional heart care, we have created designated Provider Care Teams.  These Care Teams include your primary Cardiologist (physician) and Advanced Practice Providers (APPs -  Physician Assistants and Nurse Practitioners) who all work together to provide you with the care you need, when you need it.  We recommend signing up for the patient portal called "MyChart".  Sign up information is provided on this After Visit Summary.  MyChart is used to connect with patients for Virtual Visits (Telemedicine).  Patients are able to view lab/test results, encounter notes, upcoming appointments, etc.  Non-urgent messages can be sent to your provider as well.   To learn more about what you can do with MyChart, go to [email or web address].    Your next appointment:   1 year(s)  The format for your next appointment:   In Person  Provider:   [name], MD    Other Instructions      Signed, [name], MD  07/24/2021 10:13 AM    [place]

## 2021-07-24 NOTE — Patient Instructions (Signed)
Medication Instructions:  Your physician recommends that you continue on your current medications as directed. Please refer to the Current Medication list given to you today.  *If you need a refill on your cardiac medications before your next appointment, please call your pharmacy*   Lab Work:  Your physician recommends that you return for a FASTING lipid profile: at your earliest [place] will need to be fasting. Please do not have anything to eat or drink after midnight the morning you have the lab work. You may only have water or black coffee with no cream or sugar.   Please return to our office on __________________________at________________am/pm   Testing/Procedures: None ordered   Follow-Up: At [facility], you and your health needs are our priority.  As part of our continuing mission to provide you with exceptional heart care, we have created designated Provider Care Teams.  These Care Teams include your primary Cardiologist (physician) and Advanced Practice Providers (APPs -  Physician Assistants and Nurse Practitioners) who all work together to provide you with the care you need, when you need it.  We recommend signing up for the patient portal called "MyChart".  Sign up information is provided on this After Visit Summary.  MyChart is used to connect with patients for Virtual Visits (Telemedicine).  Patients are able to view lab/test results, encounter notes, upcoming appointments, etc.  Non-urgent messages can be sent to your provider as well.   To learn more about what you can do with MyChart, go to [email or web address].    Your next appointment:   1 year(s)  The format for your next appointment:   In Person  Provider:   [name], MD    Other Instructions

## 2021-07-25 NOTE — Addendum Note (Signed)
Addended by: Raelene Bott, Janesha Brissette L on: 07/25/2021 11:50 AM   Modules accepted: Orders

## 2021-07-27 ENCOUNTER — Other Ambulatory Visit: Payer: Self-pay | Admitting: Family Medicine

## 2021-07-27 DIAGNOSIS — E041 Nontoxic single thyroid nodule: Secondary | ICD-10-CM

## 2021-07-31 ENCOUNTER — Other Ambulatory Visit (INDEPENDENT_AMBULATORY_CARE_PROVIDER_SITE_OTHER): Payer: Medicare Other

## 2021-07-31 ENCOUNTER — Other Ambulatory Visit: Payer: Self-pay

## 2021-07-31 DIAGNOSIS — E7800 Pure hypercholesterolemia, unspecified: Secondary | ICD-10-CM | POA: Diagnosis not present

## 2021-08-01 LAB — LIPID PANEL
Chol/HDL Ratio: 2.5 ratio (ref 0.0–4.4)
Cholesterol, Total: 168 mg/dL (ref 100–199)
HDL: 67 mg/dL (ref >39)
LDL Chol Calc (NIH): 85 mg/dL (ref 0–99)
Triglycerides: 88 mg/dL (ref 0–149)
VLDL Cholesterol Cal: 16 mg/dL (ref 5–40)

## 2021-08-03 ENCOUNTER — Other Ambulatory Visit: Payer: Self-pay

## 2021-08-03 ENCOUNTER — Ambulatory Visit
Admission: RE | Admit: 2021-08-03 | Discharge: 2021-08-03 | Disposition: A | Discharge status: Home / Self Care | Payer: Medicare Other | Source: Ambulatory Visit | Attending: Emergency Medicine | Admitting: Emergency Medicine

## 2021-08-03 VITALS — BP 143/83 | HR 103 | Temp 98.1°F | Resp 20

## 2021-08-03 DIAGNOSIS — R300 Dysuria: Secondary | ICD-10-CM | POA: Insufficient documentation

## 2021-08-03 LAB — POCT URINALYSIS DIP (MANUAL ENTRY)
Bilirubin, UA: NEGATIVE
Blood, UA: NEGATIVE — AB
Glucose, UA: NEGATIVE mg/dL
Leukocytes, UA: NEGATIVE
Nitrite, UA: NEGATIVE
Spec Grav, UA: >=1.03 — AB (ref 1.010–1.025)
Urobilinogen, UA: 0.2 E.U./dL
pH, UA: 5 (ref 5.0–8.0)

## 2021-08-03 MED ORDER — CEPHALEXIN 500 MG PO CAPS: 500.0000 mg | ORAL_CAPSULE | Freq: Four times a day (QID) | ORAL | 0 refills | Status: AC

## 2021-08-03 NOTE — ED Triage Notes (Signed)
Pt here with painful urination last week with sx improving after taking AZO. Now for the last 2 days, she has had back pain and has run low grade fevers at home.

## 2021-08-03 NOTE — ED Provider Notes (Signed)
[facility]  ____________________________________________  Time seen: Approximately 9:13 AM  I have reviewed the triage vital signs and the nursing notes.   HISTORY  Chief Complaint Dysuria, Back Pain, and Fever   Historian Patient    HPI Kimberly Patton is a 66 y.o. female presents to the urgent care with dysuria for the past 2 days, fever and low back pain.  Patient denies nausea, vomiting or abdominal pain.  No prior history of pyelonephritis or nephrolithiasis.   Past Medical History:  Diagnosis Date   ALLERGIC RHINITIS 04/03/2007   Anemia    Anxiety    Asthma    hx of years ago - no longer a problem    ASTHMA, PERSISTENT, MODERATE 04/03/2007   Cancer ([place])    skin    Dyspnea on exertion    a. 11/2007 Echo: EF 60%.   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 11/2011 48h Holter: RSR, rare PVC's, occas PAC's   Retinal tear of right eye 01/2016   Ventral hernia      Immunizations up to date:  Yes.     Past Medical History:  Diagnosis Date   ALLERGIC RHINITIS 04/03/2007   Anemia    Anxiety    Asthma    hx of years ago - no longer a problem    ASTHMA, PERSISTENT, MODERATE 04/03/2007   Cancer ([place])    skin    Dyspnea on exertion    a. 11/2007 Echo: EF 60%.   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 11/2011 48h Holter: RSR, rare PVC's, occas PAC's   Retinal tear  of right eye 01/2016   Ventral hernia     Patient Active Problem List   Diagnosis Date Noted   Atrophic vaginitis 03/13/2021   Tinnitus of both ears 07/21/2020   Cervical radiculopathy 06/28/2020   Chronic venous insufficiency 05/29/2020   Lymphedema 05/29/2020   CAD (coronary artery disease) 05/29/2020   Cervical lymphadenitis 04/18/2020   Dysuria 03/25/2020   History of repair of hiatal hernia 02/02/2020   Bradycardia 01/01/2020   Orthostatic hypotension 01/01/2020   Globus sensation 11/23/2019   Elevated lipase 08/27/2019   Hypercholesteremia 06/09/2019   Chronic neuropathic pain 07/31/2018   Thyroid nodule 04/15/2018   Skin lesion of scalp 09/06/2017   Osteoporosis 12/04/2016   Osteoarthritis of knee 07/17/2016   Numbness and tingling of both feet 01/27/2016   Meningioma ([place]) 01/18/2016   Family history of aortic stenosis 01/20/2015   Status post laparoscopic sleeve gastrectomy Dec 2014 06/08/2013   Morbid obesity ([place]) 12/08/2012   HTN (hypertension) 10/11/2011   Varicose veins of bilateral lower extremities with other complications 15/17/6160  Palpitations 09/12/2010   FIBROIDS, UTERUS 04/03/2007   Allergic rhinitis 04/03/2007   Asthma, mild intermittent 04/03/2007    Past Surgical History:  Procedure Laterality Date   APPENDECTOMY  1996   BREAST CYST EXCISION Right 1994   Benign   CESAREAN SECTION     x2   CHOLECYSTECTOMY  1995   COLONOSCOPY WITH PROPOFOL N/A 05/09/2021   Procedure: COLONOSCOPY WITH PROPOFOL;  Surgeon: [name], MD;  Location: [facility] ENDOSCOPY;  Service: Gastroenterology;  Laterality: N/A;   EYE SURGERY  01/2016   Repair retinal tear   FOOT SURGERY  1995 / 1996   x2   HERNIA REPAIR     KNEE ARTHROSCOPY W/ MENISCAL REPAIR  07/25/13   right knee Dr. Mardelle Matte   LAPAROSCOPIC GASTRIC SLEEVE RESECTION N/A 06/08/2013   Procedure: LAPAROSCOPIC GASTRIC SLEEVE RESECTION AND EXCISION OF SEBACEUS CYST FROM MID CHEST takedown of incarcerated ventral  hernia and primary repair endoscopy;  Surgeon: [name], MD;  Location: [facility];  Service: General;  Laterality: N/A;   LAPAROSCOPIC NISSEN FUNDOPLICATION N/A 6/37/8588   Procedure: LAPAROSCOPIC REPAIR LARGE SYMPTOMATIC HIATAL HERNIA WITH UPPER ENDOSCOPY;  Surgeon: Johnathan Hausen, MD;  Location: [facility];  Service: General;  Laterality: N/A;   moles  06/2013   removed 2 moles from under arm and  lowback   NECK SURGERY     occipital nerve damage- injections    OVARIAN CYST [place] EXCISIONAL BREAST BIOPSY Right 06/23/2021   Procedure: RADIOACTIVE SEED GUIDED EXCISIONAL RIGHT BREAST BIOPSY;  Surgeon: Johnathan Hausen, MD;  Location: [facility];  Service: General;  Laterality: Right;   RETINAL TEAR REPAIR CRYOTHERAPY Right 01/2016   [place]   SKIN CANCER EXCISION     TONSILLECTOMY     UPPER GI ENDOSCOPY  06/08/2013   Procedure: UPPER GI ENDOSCOPY;  Surgeon: [name], MD;  Location: [facility];  Service: General;;   US ECHOCARDIOGRAPHY  12/2011   WNL - EF 55-60%, mild MR, grade 1 diastolic dysnfiction (mild)   VENTRAL HERNIA REPAIR  2000    Prior to Admission medications   Medication Sig Start Date End Date Taking? Authorizing Provider  cephALEXin (KEFLEX) 500 MG capsule Take 1 capsule (500 mg total) by mouth 4 (four) times daily for 7 days. 08/03/21 08/10/21 Yes Vallarie Mare M, PA-C  ALPRAZolam Duanne Moron) 0.5 MG tablet Take one tab one hour prior to procedure and one tab when you arrive in office 08/10/21   Kris Hartmann, NP  aspirin 81 MG chewable tablet Chew by mouth daily.    [provider]  beclomethasone (QVAR REDIHALER) 40 MCG/ACT inhaler Inhale 2 puffs into the lungs 2 (two) times daily. 02/14/21   Parrett, Fonnie Mu, NP  Benfotiamine 150 MG CAPS Take by mouth.    [provider]  Calcium Citrate-Vitamin D (CITRACAL + D PO) Take 2 tablets by mouth daily.     [provider]   Cyanocobalamin (VITAMIN B-12) 5000 MCG SUBL Place 5,000 mcg under the tongue daily.    [provider]  diltiazem (CARDIZEM SR) 120 MG 12 hr capsule Take 1 capsule (120 mg total) by mouth 2 (two) times daily. 07/20/21   [name], MD  ELDERBERRY PO Take 1,000 mg by mouth daily.     [provider]  fluticasone (FLONASE) 50 MCG/ACT nasal spray SPRAY 2 SPRAYS INTO EACH NOSTRIL EVERY DAY 04/17/21   Diona Browner, Amy  E, MD  gabapentin (NEURONTIN) 300 MG capsule Take 300 mg by mouth 2 (two) times daily. 02/23/21   [provider]  HYDROcodone-acetaminophen (NORCO/VICODIN) 5-325 MG tablet Take 1 tablet by mouth every 6 (six) hours as needed for moderate pain. 06/23/21   Johnathan Hausen, MD  Lactobacillus Rhamnosus, GG, (CULTURELLE IMMUNITY SUPPORT PO) Take by mouth.    [provider]  Multiple Vitamins-Minerals (CENTRUM SILVER 50+WOMEN PO) Take by mouth.    [provider]  nitroGLYCERIN (NITROSTAT) 0.4 MG SL tablet Place 0.4 mg under the tongue every 5 (five) minutes as needed for chest pain.    [provider]  Omega-3 Fatty Acids (FISH OIL) 1200 MG CAPS Take by mouth.    [provider]  pantoprazole (PROTONIX) 40 MG tablet Take 1 tablet (40 mg total) by mouth daily. 05/10/21   Bedsole, Amy E, MD  rosuvastatin (CRESTOR) 10 MG tablet Take 1 tablet (10 mg total) by mouth daily. 07/24/21 10/22/21  [name], MD  TURMERIC PO Take by mouth.    [provider]  UBIQUINOL PO Take 100 mg by mouth daily at 12 noon.    [provider]  vitamin C (ASCORBIC ACID) 250 MG tablet Take 250 mg by mouth daily.    [provider]    Allergies Wasp venom, Chlorhexidine gluconate, Metronidazole, Prednisone, and Sodium hypochlorite  Family History  Problem Relation Age of Onset   Breast cancer Mother    Diabetes Mother    Hypertension Mother    Kidney failure Mother    Diabetes Father    Hypertension Father     Diabetes Other    Hypertension Other    Stroke Other    Hemophilia Other     Social History Social History   Tobacco Use   Smoking status: Unknown   Smokeless tobacco: Never   Tobacco comments:    smoke at age 35-12  Vaping Use   Vaping Use: Never used  Substance Use Topics   Alcohol use: Not Currently    Alcohol/week: 0.0 standard drinks   Drug use: No     Review of Systems  Constitutional: No fever/chills Eyes:  No discharge ENT: No upper respiratory complaints. Respiratory: no cough. No SOB/ use of accessory muscles to breath Gastrointestinal:   No nausea, no vomiting.  No diarrhea.  No constipation. Genitourinary: Patient has dysuria and increased urinary frequency.  Musculoskeletal: Negative for musculoskeletal pain. Skin: Negative for rash, abrasions, lacerations, ecchymosis.    ____________________________________________   PHYSICAL EXAM:  VITAL SIGNS: ED Triage Vitals  Enc Vitals Group     BP 08/03/21 0838 (!) 143/83     Pulse Rate 08/03/21 0838 (!) 103     Resp 08/03/21 0838 20     Temp 08/03/21 0838 98.1 F (36.7 C)     Temp Source 08/03/21 0838 Oral     SpO2 08/03/21 0838 98 %     Weight --      Height --      Head Circumference --      Peak Flow --      Pain Score 08/03/21 0839 4     Pain Loc --      Pain Edu? --      Excl. in [place]? --      Constitutional: Alert and oriented. Well appearing and in no acute distress. Eyes: Conjunctivae are normal. PERRL. EOMI. Head: Atraumatic. ENT: Cardiovascular: Normal rate, regular rhythm. Normal S1 and S2.  Good peripheral circulation. Respiratory:  Normal respiratory effort without tachypnea or retractions. Lungs CTAB. Good air entry to the bases with no decreased or absent breath sounds Gastrointestinal: Bowel sounds x 4 quadrants. Soft and nontender to palpation. No guarding or rigidity. No distention. Musculoskeletal: Full range of motion to all extremities. No obvious deformities noted Neurologic:   Normal for age. No gross focal neurologic deficits are appreciated.  Skin:  Skin is warm, dry and intact. No rash noted. Psychiatric: Mood and affect are normal for age. Speech and behavior are normal.   ____________________________________________   LABS (all labs ordered are listed, but only abnormal results are displayed)  Labs Reviewed  POCT URINALYSIS DIP (MANUAL ENTRY) - Abnormal; Notable for the following components:      Result Value   Clarity, UA cloudy (*)    Ketones, POC UA small (15) (*)    Spec Grav, UA >=1.030 (*)    Blood, UA negative (*)    Protein Ur, POC trace (*)    All other components within normal limits  URINE CULTURE   ____________________________________________  EKG   ____________________________________________  RADIOLOGY   No results found.  ____________________________________________    PROCEDURES  Procedure(s) performed:     Procedures     Medications - No data to display   ____________________________________________   INITIAL IMPRESSION / ASSESSMENT AND PLAN / ED COURSE  Pertinent labs & imaging results that were available during my care of the patient were reviewed by me and considered in my medical decision making (see chart for details).    Assessment and plan Dysuria 66 year old female presents to the urgent care with dysuria for the past 2 days as well as low back pain.  Patient was mildly tachycardic at triage but vital signs were otherwise reassuring.  She was alert, active and nontoxic-appearing.  She had no CVA tenderness or suprapubic tenderness on exam.  Urinalysis shows no signs of UTI.  Differential includes early UTI versus atrophic vaginitis.  Urine culture pending.  Will treat empirically given presence of fever at home.  Told patient that she should seek care at local emergency department if she experiences fever, worsening back pain, nausea or vomiting.  She voiced understanding has easy access to  emergency department.      ____________________________________________  FINAL CLINICAL IMPRESSION(S) / ED DIAGNOSES  Final diagnoses:  Dysuria      NEW MEDICATIONS STARTED DURING THIS VISIT:  ED Discharge Orders          Ordered    cephALEXin (KEFLEX) 500 MG capsule  4 times daily        08/03/21 0911                This chart was dictated using voice recognition software/Dragon. Despite best efforts to proofread, errors can occur which can change the meaning. Any change was purely unintentional.     Lannie Fields, PA-C 08/03/21 [phone]

## 2021-08-03 NOTE — Discharge Instructions (Signed)
Take Keflex four times daily for the next seven days.  

## 2021-08-04 ENCOUNTER — Other Ambulatory Visit: Payer: Self-pay

## 2021-08-04 ENCOUNTER — Encounter: Payer: Self-pay | Admitting: Family Medicine

## 2021-08-04 ENCOUNTER — Other Ambulatory Visit: Payer: Self-pay | Admitting: Family Medicine

## 2021-08-04 ENCOUNTER — Ambulatory Visit
Admission: RE | Admit: 2021-08-04 | Discharge: 2021-08-04 | Disposition: A | Discharge status: Home / Self Care | Payer: Medicare Other | Source: Ambulatory Visit | Attending: Family Medicine | Admitting: Family Medicine

## 2021-08-04 DIAGNOSIS — E041 Nontoxic single thyroid nodule: Secondary | ICD-10-CM | POA: Diagnosis not present

## 2021-08-04 LAB — URINE CULTURE

## 2021-08-08 ENCOUNTER — Encounter: Payer: Self-pay | Admitting: Family Medicine

## 2021-08-08 NOTE — Telephone Encounter (Signed)
I have sent Referral message to the Referral Coordinator with [facility]. I CCd you the message.

## 2021-08-09 NOTE — Telephone Encounter (Signed)
Patient sent my chart, experiencing pain, urgent care  2.23.23 labs not conclusive, needs better labs, can patient come in for labs without an appointment since she was seen at urgent care on 2.23.23? ? ?Please let the patient know, she says she can come today or tomorrow ?

## 2021-08-10 NOTE — Telephone Encounter (Signed)
Patient is calling to follow up on if she needs labs done. I lost her call when attempting to scheduled an office visti. I call patient 3 times with no answer and left voicemail for patient to call back to scheduled

## 2021-08-11 ENCOUNTER — Other Ambulatory Visit (INDEPENDENT_AMBULATORY_CARE_PROVIDER_SITE_OTHER): Payer: Medicare Other

## 2021-08-11 DIAGNOSIS — R300 Dysuria: Secondary | ICD-10-CM | POA: Diagnosis not present

## 2021-08-11 LAB — POCT UA - MICROSCOPIC ONLY

## 2021-08-11 LAB — POC URINALSYSI DIPSTICK (AUTOMATED)
Bilirubin, UA: NEGATIVE
Blood, UA: NEGATIVE
Glucose, UA: NEGATIVE
Ketones, UA: NEGATIVE
Nitrite, UA: NEGATIVE
Protein, UA: NEGATIVE
Spec Grav, UA: 1.02 (ref 1.010–1.025)
Urobilinogen, UA: 0.2 E.U./dL
pH, UA: 6 (ref 5.0–8.0)

## 2021-08-11 NOTE — Addendum Note (Signed)
Addended by: Carter Kitten on: 08/11/2021 11:18 AM ? ? Modules accepted: Orders ? ?

## 2021-08-11 NOTE — Addendum Note (Signed)
Addended byEliezer Lofts E on: 08/11/2021 10:54 AM ? ? Modules accepted: Orders ? ?

## 2021-08-13 LAB — URINE CULTURE
MICRO NUMBER:: 13085380
SPECIMEN QUALITY:: ADEQUATE

## 2021-08-16 ENCOUNTER — Encounter (HOSPITAL_COMMUNITY): Payer: Self-pay

## 2021-08-17 ENCOUNTER — Encounter (INDEPENDENT_AMBULATORY_CARE_PROVIDER_SITE_OTHER): Payer: Self-pay | Admitting: Vascular Surgery

## 2021-08-17 ENCOUNTER — Other Ambulatory Visit: Payer: Self-pay

## 2021-08-17 ENCOUNTER — Ambulatory Visit (INDEPENDENT_AMBULATORY_CARE_PROVIDER_SITE_OTHER): Payer: Medicare Other | Admitting: Vascular Surgery

## 2021-08-17 VITALS — BP 121/83 | HR 73 | Resp 16 | Wt 271.0 lb

## 2021-08-17 DIAGNOSIS — I83893 Varicose veins of bilateral lower extremities with other complications: Secondary | ICD-10-CM | POA: Diagnosis not present

## 2021-08-17 NOTE — Progress Notes (Signed)
? ? ?  MRN : [id number] ? ?Kimberly Patton is a 66 y.o. (1956-06-08) female who presents with chief complaint of painful varicose veins. ? ? ? ?The patient's left lower extremity was sterilely prepped and draped.  The ultrasound machine was used to visualize the left great saphenous vein throughout its course.  A segment below the knee was selected for access.  The saphenous vein was accessed without difficulty using ultrasound guidance with a micropuncture needle.   An 0.018  wire was placed beyond the saphenofemoral junction through the sheath and the microneedle was removed.  The 65 cm sheath was then placed over the wire and the wire and dilator were removed.  The laser fiber was placed through the sheath and its tip was placed approximately 2 cm below the saphenofemoral junction.  Tumescent anesthesia was then created with a dilute lidocaine solution.  Laser energy was then delivered with constant withdrawal of the sheath and laser fiber.  Approximately 477 Joules of energy were delivered over a length of 8 cm.  Sterile dressings were placed.  The patient tolerated the procedure well without complications.  ?

## 2021-08-21 ENCOUNTER — Other Ambulatory Visit (INDEPENDENT_AMBULATORY_CARE_PROVIDER_SITE_OTHER): Payer: Self-pay | Admitting: Vascular Surgery

## 2021-08-21 DIAGNOSIS — I83893 Varicose veins of bilateral lower extremities with other complications: Secondary | ICD-10-CM

## 2021-08-24 ENCOUNTER — Other Ambulatory Visit (INDEPENDENT_AMBULATORY_CARE_PROVIDER_SITE_OTHER): Payer: Medicare Other

## 2021-08-24 ENCOUNTER — Other Ambulatory Visit: Payer: Self-pay

## 2021-08-24 DIAGNOSIS — I83893 Varicose veins of bilateral lower extremities with other complications: Secondary | ICD-10-CM

## 2021-08-24 MED ORDER — DILTIAZEM HCL ER 120 MG PO CP12: 120.0000 mg | ORAL_CAPSULE | Freq: Two times a day (BID) | ORAL | 9 refills | Status: DC

## 2021-08-24 NOTE — Telephone Encounter (Signed)
*  STAT* If patient is at the pharmacy, call can be transferred to refill team.   1. Which medications need to be refilled? (please list name of each medication and dose if known) Diltiazem  2. Which pharmacy/location (including street and city if local pharmacy) is medication to be sent to? [facility]  3. Do they need a 30 day or 90 day supply? [place]

## 2021-08-28 ENCOUNTER — Other Ambulatory Visit: Payer: Self-pay

## 2021-08-28 ENCOUNTER — Encounter: Payer: Self-pay | Admitting: Internal Medicine

## 2021-08-28 ENCOUNTER — Ambulatory Visit (INDEPENDENT_AMBULATORY_CARE_PROVIDER_SITE_OTHER): Payer: Medicare Other | Admitting: Internal Medicine

## 2021-08-28 VITALS — BP 130/80 | HR 72 | Ht 66.0 in | Wt 277.0 lb

## 2021-08-28 DIAGNOSIS — M810 Age-related osteoporosis without current pathological fracture: Secondary | ICD-10-CM

## 2021-08-28 DIAGNOSIS — E042 Nontoxic multinodular goiter: Secondary | ICD-10-CM | POA: Diagnosis not present

## 2021-08-28 LAB — TSH: TSH: 0.85 u[IU]/mL (ref 0.35–5.50)

## 2021-08-28 NOTE — Patient Instructions (Addendum)
We will schedule a new thyroid nodule biopsy for you. ? ?Please stop at the lab. ? ?Please come back for a follow-up appointment in 1 year. ? ?Thyroid Nodule ?A thyroid nodule is an isolated growth of thyroid cells that forms a lump in your thyroid gland. The thyroid gland is a butterfly-shaped gland. It is found in the lower front of your neck. This gland sends chemical messengers (hormones) through your blood to all parts of your body. These hormones are important in regulating your body temperature and helping your body to use energy. ?Thyroid nodules are common. Most are not cancerous (benign). You may have one nodule or several nodules. ?Different types of thyroid nodules include nodules that: ?Grow and fill with fluid (thyroid cysts). ?Produce too much thyroid hormone (hot nodules or hyperthyroid). ?Produce no thyroid hormone (cold nodules or hypothyroid). ?Form from cancer cells (thyroid cancers). ?What are the causes? ?In most cases, the cause of this condition is not known. ?What increases the risk? ?The following factors may make you more likely to develop this condition. ?Age. Thyroid nodules become more common in people who are older than 66 years of age. ?Gender. ?Benign thyroid nodules are more common in women. ?Cancerous (malignant) thyroid nodules are more common in men. ?A family history that includes: ?Thyroid nodules. ?Pheochromocytoma. ?Thyroid carcinoma. ?Hyperparathyroidism. ?Certain kinds of thyroid diseases, such as Hashimoto's thyroiditis. ?Lack of iodine in your diet. ?A history of head and neck radiation, such as from previous cancer treatment. ?What are the signs or symptoms? ?In many cases, there are no symptoms. If you have symptoms, they may include: ?A lump in your lower neck. ?Feeling a lump or tickle in your throat. ?Pain in your neck, jaw, or ear. ?Having trouble swallowing. ?Hot nodules may cause symptoms that include: ?Weight loss. ?Warm, flushed skin. ?Feeling hot. ?Feeling  nervous. ?A racing heartbeat. ?Cold nodules may cause symptoms that include: ?Weight gain. ?Dry skin. ?Brittle hair. This may also occur with hair loss. ?Feeling cold. ?Fatigue. ?Thyroid cancer nodules may cause symptoms that include: ?Hard nodules that feel stuck to the thyroid gland. ?Hoarseness. ?Lumps in the glands near your thyroid (lymph nodes). ?How is this diagnosed? ?A thyroid nodule may be felt by your health care provider during a physical exam. This condition may also be diagnosed based on your symptoms. You may also have tests, including: ?An ultrasound. This may be done to confirm the diagnosis. ?A biopsy. This involves taking a sample from the nodule and looking at it under a microscope. ?Blood tests to make sure that your thyroid is working properly. ?A thyroid scan. This test uses a radioactive tracer injected into a vein to create an image of the thyroid gland on a computer screen. ?Imaging tests such as MRI or CT scan. These may be done if: ?Your nodule is large. ?Your nodule is blocking your airway. ?Cancer is suspected. ?How is this treated? ?Treatment depends on the cause and size of your nodule or nodules. If the nodule is benign, treatment may not be necessary. Your health care provider may monitor the nodule to see if it goes away without treatment. If the nodule continues to grow, is cancerous, or does not go away, treatment may be needed. Treatment may include: ?Having a cystic nodule drained with a needle. ?Ablation therapy. In this treatment, alcohol is injected into the area of the nodule to destroy the cells. Ablation with heat (thermal ablation) may also be used. ?Radioactive iodine. In this treatment, radioactive iodine is given as  a pill or liquid that you drink. This substance causes the thyroid nodule to shrink. ?Surgery to remove the nodule. Part or all of your thyroid gland may need to be removed as well. ?Medicines. ?Follow these instructions at home: ?Pay attention to any  changes in your nodule. ?Take over-the-counter and prescription medicines only as told by your health care provider. ?Keep all follow-up visits as told by your health care provider. This is important. ?Contact a health care provider if: ?Your voice changes. ?You have trouble swallowing. ?You have pain in your neck, ear, or jaw that is getting worse. ?Your nodule gets bigger. ?Your nodule starts to make it harder for you to breathe. ?Your muscles look like they are shrinking (muscle wasting). ?Get help right away if: ?You have chest pain. ?There is a loss of consciousness. ?You have a sudden fever. ?You feel confused. ?You are seeing or hearing things that other people do not see or hear (having hallucinations). ?You feel very weak. ?You have mood swings. ?You feel very restless. ?You feel suddenly nauseous or throw up. ?You suddenly have diarrhea. ?Summary ?A thyroid nodule is an isolated growth of thyroid cells that forms a lump in your thyroid gland. ?Thyroid nodules are common. Most are not cancerous (benign). You may have one nodule or several nodules. ?Treatment depends on the cause and size of your nodule or nodules. If the nodule is benign, treatment may not be necessary. ?Your health care provider may monitor the nodule to see if it goes away without treatment. If the nodule continues to grow, is cancerous, or does not go away, treatment may be needed. ?This information is not intended to replace advice given to you by your health care provider. Make sure you discuss any questions you have with your health care provider. ?Document Revised: 01/10/2018 Document Reviewed: 01/13/2018 ?Elsevier Patient Education ? 2022 [employer]. ? ? ?

## 2021-08-28 NOTE — Progress Notes (Addendum)
Patient ID: Kimberly Patton, female   DOB: 1956/02/24, 66 y.o.   MRN: [id number] ? ?This visit occurred during the SARS-CoV-2 public health emergency.  Safety protocols were in place, including screening questions prior to the visit, additional usage of staff PPE, and extensive cleaning of exam room while observing appropriate contact time as indicated for disinfecting solutions.  ? ? ?HPI  ?Kimberly Patton is a 66 y.o.-year-old female, referred by her PCP, Dr. Diona Browner, for evaluation and management of thyroid nodules. ? ?Patient has a several year history of thyroid nodules which have been investigated before but recently one of the nodules was described as increased in size on the CT chest.  After the thyroid ultrasound confirmed this finding, she was referred to endocrinology. ? ?Thyroid U/S (03/19/2018):  ?Right mid isoechoic nodule or pseudonodule, measuring 0.8 x 0.8 x 0.6 cm ?Left mid solid isoechoic nodule, measuring 3.5 x 2.1 x 1.5 cm ? ?FNA (04/10/2018) of the dominant nodule: Benign (Bethesda category 2) ? ?Thyroid U/S (01/12/2020):  ?Right mid thyroid nodule stable, measuring 3.4 x 2.4 x 2.1 cm ? ?CT chest (01/11/2021): ?Right-sided thyroid nodule measuring at least 3.4 cm, appearing increased in size since previous evaluation ? ?Thyroid U/S (08/04/2021): ?Parenchymal Echotexture: Mildly heterogenous  ?Isthmus: 0.3 cm ?Right lobe: 5.5 x 1.5 x 1.8 cm  ?Left lobe: 5.1 x 3.0 x 3.7 cm  ?_________________________________________________________ ?  ?Study decreased size of previously visualized, benign-appearing ?solid nodule in the right inferior thyroid (labeled 1, 0.7 cm, previously 0.9 cm). ?  ?Nodule # 2:  ?Prior biopsy: Yes  ?Location: Left; Mid  ?Maximum size: 4.0 cm; Other 2 dimensions: 3.5 x 2.8 cm, previously, 3.4 x 2.1 x 2.4 cm  ?Composition: solid/almost completely solid (2)  ?Echogenicity: isoechoic (1) ?Significant change in size (>/= 20% in two dimensions and minimal increase of 2 mm):  Yes ? ? ? ? ?_________________________________________________________ ?  ?No new discrete nodules.  No cervical lymphadenopathy. ?  ?IMPRESSION: ?Slight interval enlargement of previously biopsied left mid solid ?thyroid nodule (labeled 2, 4.0 cm, previously 3.4 cm). Recommend ?correlation prior biopsy results. ? ?Pt denies: ?- feeling nodules in neck ?- hoarseness ?- dysphagia ?- choking ?- SOB with lying down, but may have wheezing - has asthma. ? ?I reviewed pt's thyroid tests: ?Lab Results  ?Component Value Date  ? TSH 0.66 11/23/2019  ? TSH 0.87 01/19/2019  ? TSH 0.86 03/19/2018  ? TSH 0.78 05/28/2017  ? TSH 1.428 05/05/2017  ? TSH 0.89 01/16/2016  ? TSH 1.040 02/23/2013  ? TSH 0.632 04/07/2010  ? TSH 2.027 12/03/2007  ? FREET4 1.09 01/19/2019  ? FREET4 1.00 03/19/2018  ?  ?Pt denies: ?- fatigue ?- heat intolerance/cold intolerance ?- tremors ?- palpitations ?- anxiety/depression ?- hyperdefecation/constipation ?- weight loss/weight gain ?- dry skin ?- hair loss ? ?+ FH of thyroid ds.: thyroid nodules:- mother, 2 sisters.  No FH of thyroid cancer. No h/o radiation tx to head or neck. ? ?No seaweed or kelp. No recent contrast studies. No steroid use. No herbal supplements. No Biotin supplements or Hair, Skin and Nails vitamins. ? ?Pt also has a history of SVT-sees cardiology.  ?Also, has a hiatal hernia repair with mesh - scar in the diaphragm mm >> occas. SOB.  ?She had a breast mass 05/2021 >> benign. ?She had a severe reaction to the COVID-vaccine.  She has a history of sleeve gastrectomy, but gained a significant amount of pounds during the coronavirus pandemic as she was not able  to get out of bed. ?She has arthritis and osteoporosis. 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 ? ? ?ROS: ?Constitutional: + See HPI, + nocturia, + poor sleep, + weight gain ?Eyes: no blurry vision, no xerophthalmia ?ENT: no sore throat,  + see HPI, + tinnitus, + decreased hearing ?Cardiovascular: +  Both: CP/SOB; no palpitations/leg swelling ?Respiratory: no cough/SOB ?Gastrointestinal: no N/V/D/C/+ occasional acid reflux ?Musculoskeletal: + Muscle and oint aches ?Skin: no rashes ?Neurological: no tremors/numbness/tingling/dizziness, + headache ?Psychiatric: no depression/anxiety ? ?Past Medical History:  ?Diagnosis Date  ? ALLERGIC RHINITIS 04/03/2007  ? Allergy   ? Not sure  ? Anemia   ? ((Pt Qnr Sub: Denies at visit from 08/28/2021 with endocrinology))   ? Anxiety   ? Arthritis Not sure  ? Asthma   ? hx of years ago - no longer a problem   ? ASTHMA, PERSISTENT, MODERATE 04/03/2007  ? Cancer [facility])   ? skin   ? Dyspnea on exertion   ? a. 11/2007 Echo: EF 60%.  ? Dysrhythmia   ? hx of heart arrhythmia 10-15 years ago - followed by DR Lovena Le - not seen in years   ? GERD (gastroesophageal reflux disease)   ? 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 Hypertension   ? Joint pain   ? HIPS / LEGS  ? Knee injury   ? RT  ? Meningioma ([place])   ? Morbid obesity ([place])   ? Neuromuscular disorder [facility]) Not sure, since my fall  ? Paroxysmal SVT (supraventricular tachycardia) (H[facility])   ? a. 11/2011 48h Holter: RSR, rare PVC's, occas PAC's  ? Retinal tear of right eye 01/2016  ? Ventral hernia   ? ?Past Surgical History:  ?Procedure Laterality Date  ? ABDOMINAL HYSTERECTOMY  Think 1994  ? APPENDECTOMY  1996  ? BREAST CYST EXCISION Right 1994  ? Benign  ? CESAREAN SECTION    ? x2  ? CHOLECYSTECTOMY  1995  ? COLONOSCOPY WITH PROPOFOL N/A 05/09/2021  ? Procedure: COLONOSCOPY WITH PROPOFOL;  Surgeon: [name], MD;  Location: [facility] ENDOSCOPY;  Service: Gastroenterology;  Laterality: N/A;  ? EYE SURGERY  01/2016  ? Repair retinal tear  ? FOOT SURGERY  1995 / 1996  ? x2  ? HERNIA REPAIR    ? KNEE ARTHROSCOPY W/ MENISCAL REPAIR  07/25/2013  ? right knee Dr. Mardelle Matte  ? LAPAROSCOPIC GASTRIC SLEEVE RESECTION N/A 06/08/2013  ? Procedure: LAPAROSCOPIC GASTRIC SLEEVE RESECTION AND EXCISION OF SEBACEUS CYST FROM MID CHEST takedown of incarcerated ventral hernia and  primary repair endoscopy;  Surgeon: [name], MD;  Location: [facility] ORS;  Service: General;  Laterality: N/A;  ? LAPAROSCOPIC NISSEN FUNDOPLICATION N/A 12/02/7626  ? Procedure: LAPAROSCOPIC REPAIR LARGE SYMPTOMATIC HIATAL HERNIA WITH UPPER ENDOSCOPY;  Surgeon: Johnathan Hausen, MD;  Location: [facility] ORS;  Service: General;  Laterality: N/A;  ? moles  06/2013  ? removed 2 moles from under arm and  lowback  ? NECK SURGERY    ? occipital nerve damage- injections   ? OVARIAN CYST REMOVAL  1970  ? PILONIDAL CYST EXCISION  1975  ? RADIOACTIVE SEED GUIDED EXCISIONAL BREAST BIOPSY Right 06/23/2021  ? Procedure: RADIOACTIVE SEED GUIDED EXCISIONAL RIGHT BREAST BIOPSY;  Surgeon: Johnathan Hausen, MD;  Location: [place];  Service: General;  Laterality: Right;  ? RETINAL TEAR REPAIR CRYOTHERAPY Right 01/2016  ? [place]  ? SKIN CANCER EXCISION    ? TONSILLECTOMY    ? TUBAL LIGATION  Think 1994  ?  UPPER GI ENDOSCOPY  06/08/2013  ? Procedure: UPPER GI ENDOSCOPY;  Surgeon: [name], MD;  Location: [facility] ORS;  Service: General;;  ? US ECHOCARDIOGRAPHY  12/2011  ? WNL - EF 55-60%, mild MR, grade 1 diastolic dysnfiction (mild)  ? VENTRAL HERNIA REPAIR  2000  ? ?Social History  ? ?Socioeconomic History  ? Marital status: Divorced  ?  Spouse name: Not on file  ? Number of children: 2  ? Years of education: 69  ? Highest education level: Not on file  ?Occupational History  ? Occupation: Pharmacist, hospital  ?  Employer: [employer]  ?Tobacco Use  ? Smoking status: Never  ? Smokeless tobacco: Never  ? Tobacco comments:  ?  smoke at age [age]  ?Vaping Use  ? Vaping Use: Never used  ?Substance and Sexual Activity  ? Alcohol use: Not Currently  ? Drug use: No  ? Sexual activity: Not Currently  ?  Partners: Male  ?  Birth control/protection: Abstinence, Pill  ?Other Topics Concern  ? Not on file  ?Social History Narrative  ? Lives at home alone.  ? Right-handed.  ? Occasional caffeine use.  ? ?Social Determinants of Health   ? ?Financial Resource Strain: Not on file  ?Food Insecurity: Not on file  ?Transportation Needs: Not on file  ?Physical Activity: Not on file  ?Stress: Not on file  ?Social Connections: Not on file  ?[place]

## 2021-08-29 ENCOUNTER — Encounter: Payer: Self-pay | Admitting: Family Medicine

## 2021-08-29 ENCOUNTER — Ambulatory Visit
Admission: RE | Admit: 2021-08-29 | Discharge: 2021-08-29 | Disposition: A | Discharge status: Home / Self Care | Payer: Medicare Other | Source: Ambulatory Visit | Attending: Family Medicine | Admitting: Family Medicine

## 2021-08-29 DIAGNOSIS — M85832 Other specified disorders of bone density and structure, left forearm: Secondary | ICD-10-CM | POA: Diagnosis not present

## 2021-08-29 DIAGNOSIS — L905 Scar conditions and fibrosis of skin: Secondary | ICD-10-CM | POA: Diagnosis not present

## 2021-08-29 DIAGNOSIS — Z780 Asymptomatic menopausal state: Secondary | ICD-10-CM | POA: Diagnosis not present

## 2021-08-29 DIAGNOSIS — L820 Inflamed seborrheic keratosis: Secondary | ICD-10-CM | POA: Diagnosis not present

## 2021-08-29 DIAGNOSIS — M810 Age-related osteoporosis without current pathological fracture: Secondary | ICD-10-CM | POA: Diagnosis not present

## 2021-08-30 ENCOUNTER — Other Ambulatory Visit: Payer: Self-pay | Admitting: Internal Medicine

## 2021-08-31 MED ORDER — FLUCONAZOLE 100 MG PO TABS: 100.0000 mg | ORAL_TABLET | Freq: Every day | ORAL | 0 refills | Status: DC

## 2021-08-31 NOTE — Telephone Encounter (Signed)
Dr. Gonzalez, please advise. Thanks 

## 2021-08-31 NOTE — Telephone Encounter (Signed)
Lets hold off on the inhaler for a few days.  We will send a prescription of fluconazole to her pharmacy 100 mg p.o. for 7 days.  She should not resume inhaler for at least 2 weeks.  Then she should rinse with some water and baking soda in it after use.  Reassess inhaler on return visit. ?

## 2021-09-05 ENCOUNTER — Other Ambulatory Visit: Payer: Self-pay

## 2021-09-05 ENCOUNTER — Encounter: Payer: Self-pay | Admitting: Family Medicine

## 2021-09-05 ENCOUNTER — Other Ambulatory Visit (HOSPITAL_COMMUNITY)
Admission: RE | Admit: 2021-09-05 | Discharge: 2021-09-05 | Disposition: A | Discharge status: Home / Self Care | Payer: Medicare Other | Source: Ambulatory Visit | Attending: Interventional Radiology | Admitting: Interventional Radiology

## 2021-09-05 ENCOUNTER — Ambulatory Visit (INDEPENDENT_AMBULATORY_CARE_PROVIDER_SITE_OTHER): Payer: Medicare Other | Admitting: Family Medicine

## 2021-09-05 ENCOUNTER — Ambulatory Visit
Admission: RE | Admit: 2021-09-05 | Discharge: 2021-09-05 | Disposition: A | Discharge status: Home / Self Care | Payer: Medicare Other | Source: Ambulatory Visit | Attending: Internal Medicine | Admitting: Internal Medicine

## 2021-09-05 VITALS — BP 104/70 | HR 60 | Temp 97.7°F | Ht 66.0 in | Wt 270.6 lb

## 2021-09-05 DIAGNOSIS — D440 Neoplasm of uncertain behavior of thyroid gland: Secondary | ICD-10-CM | POA: Insufficient documentation

## 2021-09-05 DIAGNOSIS — M810 Age-related osteoporosis without current pathological fracture: Secondary | ICD-10-CM

## 2021-09-05 DIAGNOSIS — E042 Nontoxic multinodular goiter: Secondary | ICD-10-CM

## 2021-09-05 DIAGNOSIS — E041 Nontoxic single thyroid nodule: Secondary | ICD-10-CM | POA: Diagnosis not present

## 2021-09-05 MED ORDER — VITAMIN D3 1.25 MG (50000 UT) PO CAPS: 1.0000 | ORAL_CAPSULE | ORAL | 3 refills | Status: DC

## 2021-09-05 NOTE — Patient Instructions (Addendum)
Start weight bearing  exercise. ? Start vit D weekly to boost vit D,  increase calcium in diet. ?  IV bisphosphonate ( Boniva) or Prolia. ? ? ?

## 2021-09-05 NOTE — Progress Notes (Signed)
? ? Patient ID: Kimberly Patton, female    DOB: 07/19/1955, 66 y.o.   MRN: [id number] ? ?This visit was conducted in person. ? ?BP 104/70   Pulse 60   Temp 97.7 ?F (36.5 ?C) (Oral)   Ht '5\' 6"'$  (1.676 m)   Wt 270 lb 9 oz (122.7 kg)   SpO2 98%   BMI 43.67 kg/m?   ? ?CC: ?Chief Complaint  ?Patient presents with  ? Osteoporosis  ?  Discuss Bone Density results and treatment options  ? ? ?Subjective:  ? ?HPI: ?Kimberly Patton is a 66 y.o. female presenting on 09/05/2021 for Osteoporosis (Discuss Bone Density results and treatment options) ? ? She has thyroid biopsy today  to evaluate the nodule. Followed by ENDO. ? ?She has been treated for thrush...with fluconazole.. now issue resolved. ? ?Osteoporosis: Recent bone density from August 29, 2021 showed a T score of -3 in the right femur neck.  This had progressed some from last check in 2021. ?She has contraindication for oral bisphosphonate including severe reflux moderate hiatal hernia and past bariatric surgery. ? ?Her most recent GFR was greater than 60 ? ?   ? ?Relevant past medical, surgical, family and social history reviewed and updated as indicated. Interim medical history since our last visit reviewed. ?Allergies and medications reviewed and updated. ?Outpatient Medications Prior to Visit  ?Medication Sig Dispense Refill  ? augmented betamethasone dipropionate (DIPROLENE-AF) 0.05 % ointment Apply topically.    ? Calcium Citrate-Vitamin D (CITRACAL + D PO) Take 2 tablets by mouth daily.     ? Cyanocobalamin (VITAMIN B-12) 5000 MCG SUBL Place 5,000 mcg under the tongue daily.    ? diltiazem (CARDIZEM SR) 120 MG 12 hr capsule Take 1 capsule (120 mg total) by mouth 2 (two) times daily. 60 capsule 9  ? ELDERBERRY PO Take 1,000 mg by mouth daily.     ? fluticasone (FLONASE) 50 MCG/ACT nasal spray SPRAY 2 SPRAYS INTO EACH NOSTRIL EVERY DAY 48 mL 1  ? gabapentin (NEURONTIN) 300 MG capsule Take 300 mg by mouth 2 (two) times daily.    ? Lactobacillus Rhamnosus, GG,  (CULTURELLE IMMUNITY SUPPORT PO) Take by mouth.    ? Magnesium 500 MG TABS     ? Multiple Vitamins-Minerals (CENTRUM SILVER 50+WOMEN PO) Take by mouth.    ? nitroGLYCERIN (NITROSTAT) 0.4 MG SL tablet Place 0.4 mg under the tongue every 5 (five) minutes as needed for chest pain.    ? Omega-3 Fatty Acids (FISH OIL) 1200 MG CAPS Take by mouth.    ? pantoprazole (PROTONIX) 40 MG tablet Take 1 tablet (40 mg total) by mouth daily. 90 tablet 1  ? Riboflavin 100 MG CAPS     ? rosuvastatin (CRESTOR) 10 MG tablet Take 1 tablet (10 mg total) by mouth daily. 90 tablet 3  ? TURMERIC PO Take by mouth.    ? UBIQUINOL PO Take 100 mg by mouth daily at 12 noon.    ? beclomethasone (QVAR REDIHALER) 40 MCG/ACT inhaler Inhale 2 puffs into the lungs 2 (two) times daily. (Patient not taking: Reported on 09/05/2021) 1 each 5  ? fluconazole (DIFLUCAN) 100 MG tablet Take 1 tablet (100 mg total) by mouth daily. 7 tablet 0  ? ?No facility-administered medications prior to visit.  ?  ? ?Per HPI unless specifically indicated in ROS section below ?Review of Systems  ?Constitutional:  Negative for fatigue and fever.  ?HENT:  Negative for ear pain.   ?Eyes:  Negative  for pain.  ?Respiratory:  Negative for chest tightness and shortness of breath.   ?Cardiovascular:  Negative for chest pain, palpitations and leg swelling.  ?Gastrointestinal:  Negative for abdominal pain.  ?Genitourinary:  Negative for dysuria.  ?Objective:  ?BP 104/70   Pulse 60   Temp 97.7 ?F (36.5 ?C) (Oral)   Ht '5\' 6"'$  (1.676 m)   Wt 270 lb 9 oz (122.7 kg)   SpO2 98%   BMI 43.67 kg/m?   ?Wt Readings from Last 3 Encounters:  ?09/05/21 270 lb 9 oz (122.7 kg)  ?08/28/21 277 lb (125.6 kg)  ?08/17/21 271 lb (122.9 kg)  ?  ?  ?Physical Exam ?Constitutional:   ?   General: She is not in acute distress. ?   Appearance: Normal appearance. She is well-developed. She is obese. She is not ill-appearing or toxic-appearing.  ?HENT:  ?   Head: Normocephalic.  ?   Right Ear: Hearing, tympanic  membrane, ear canal and external ear normal. Tympanic membrane is not erythematous, retracted or bulging.  ?   Left Ear: Hearing, tympanic membrane, ear canal and external ear normal. Tympanic membrane is not erythematous, retracted or bulging.  ?   Nose: No mucosal edema or rhinorrhea.  ?   Right Sinus: No maxillary sinus tenderness or frontal sinus tenderness.  ?   Left Sinus: No maxillary sinus tenderness or frontal sinus tenderness.  ?   Mouth/Throat:  ?   Pharynx: Uvula midline.  ?Eyes:  ?   General: Lids are normal. Lids are everted, no foreign bodies appreciated.  ?   Conjunctiva/sclera: Conjunctivae normal.  ?   Pupils: Pupils are equal, round, and reactive to light.  ?Neck:  ?   Thyroid: No thyroid mass or thyromegaly.  ?   Vascular: No carotid bruit.  ?   Trachea: Trachea normal.  ?Cardiovascular:  ?   Rate and Rhythm: Normal rate and regular rhythm.  ?   Pulses: Normal pulses.  ?   Heart sounds: Normal heart sounds, S1 normal and S2 normal. No murmur heard. ?  No friction rub. No gallop.  ?Pulmonary:  ?   Effort: Pulmonary effort is normal. No tachypnea or respiratory distress.  ?   Breath sounds: Normal breath sounds. No decreased breath sounds, wheezing, rhonchi or rales.  ?Abdominal:  ?   General: Bowel sounds are normal.  ?   Palpations: Abdomen is soft.  ?   Tenderness: There is no abdominal tenderness.  ?Musculoskeletal:  ?   Cervical back: Normal range of motion and neck supple.  ?Skin: ?   General: Skin is warm and dry.  ?   Findings: No rash.  ?Neurological:  ?   Mental Status: She is alert.  ?Psychiatric:     ?   Mood and Affect: Mood is not anxious or depressed.     ?   Speech: Speech normal.     ?   Behavior: Behavior normal. Behavior is cooperative.     ?   Thought Content: Thought content normal.     ?   Judgment: Judgment normal.  ? ?   ?Results for orders placed or performed in visit on 08/28/21  ?TSH  ?Result Value Ref Range  ? TSH 0.85 0.35 - 5.50 uIU/mL  ? ? ?This visit occurred during  the SARS-CoV-2 public health emergency.  Safety protocols were in place, including screening questions prior to the visit, additional usage of staff PPE, and extensive cleaning of exam room while observing appropriate contact  time as indicated for disinfecting solutions.  ? ?COVID 19 screen:  No recent travel or known exposure to [place] ?The patient denies respiratory symptoms of COVID 19 at this time. ?The importance of social distancing was discussed today.  ? ?Assessment and Plan ? ?Problem List Items Addressed This Visit   ? ? Age-related osteoporosis without current pathological fracture - Primary  ?  Chronic, worsening ? ?Reviewed previous bone densities in detail.  Reviewed recent endocrine office visits in detail. ? ?Encouraged patient to continue working on weight bearing exercise, calcium and vitamin D supplementation. ?She has contraindication for oral bisphosphonate given she has severe reflux, a moderate hiatal hernia and past bariatric surgery.  We reviewed options in detail.  Prolia seems to be the best option for her and she has no current contraindications.  She will look into insurance coverage and will consider this.  She will let me know if she is interested in proceeding.  Next bone density will be planned in 2 years. ? ? ? ?  ?  ? Relevant Medications  ? Cholecalciferol (VITAMIN D3) 1.25 MG (50000 UT) CAPS  ? ? ?Meds ordered this encounter  ?Medications  ? Cholecalciferol (VITAMIN D3) 1.25 MG (50000 UT) CAPS  ?  Sig: Take 1 capsule by mouth once a week.  ?  Dispense:  12 capsule  ?  Refill:  3  ? ? ?Of note her reflux is treated with Protonix 40 mg daily.  She cannot stop this despite increased risk of osteoporosis associated, given her poor reflux, hiatal hernia symptoms when off. ? ?[name], MD  ? ?

## 2021-09-06 ENCOUNTER — Encounter: Payer: Self-pay | Admitting: Internal Medicine

## 2021-09-06 LAB — CYTOLOGY - NON PAP

## 2021-09-07 ENCOUNTER — Telehealth (INDEPENDENT_AMBULATORY_CARE_PROVIDER_SITE_OTHER): Payer: Self-pay | Admitting: Vascular Surgery

## 2021-09-07 NOTE — Addendum Note (Signed)
Addended by: Philemon Kingdom on: 09/07/2021 05:43 PM ? ? Modules accepted: Orders ? ?

## 2021-09-07 NOTE — Telephone Encounter (Signed)
Patient was recommended to wear compression and elevate above heart level. Patient was informed that swelling is to be expected after having laser ablation. Patient was made aware with Arna Medici NP medical recommendations and verbalized understanding. ?

## 2021-09-07 NOTE — Telephone Encounter (Signed)
Patient called and stated she had a vein ablation two weeks ago and this morning when she woke up, she has swelling on the bottom of her leg and ankle is stiff.  Wants to know if she should come in and be seen.  Please advise. ?

## 2021-09-09 DIAGNOSIS — E042 Nontoxic multinodular goiter: Secondary | ICD-10-CM | POA: Diagnosis not present

## 2021-09-13 ENCOUNTER — Encounter: Payer: Self-pay | Admitting: Family Medicine

## 2021-09-18 ENCOUNTER — Ambulatory Visit (INDEPENDENT_AMBULATORY_CARE_PROVIDER_SITE_OTHER): Payer: BC Managed Care – PPO | Admitting: Vascular Surgery

## 2021-09-19 ENCOUNTER — Encounter (HOSPITAL_COMMUNITY): Payer: Self-pay

## 2021-09-20 ENCOUNTER — Encounter: Payer: Self-pay | Admitting: Internal Medicine

## 2021-09-21 ENCOUNTER — Ambulatory Visit
Admission: RE | Admit: 2021-09-21 | Discharge: 2021-09-21 | Disposition: A | Discharge status: Home / Self Care | Payer: Medicare Other | Source: Ambulatory Visit | Attending: Physician Assistant | Admitting: Physician Assistant

## 2021-09-21 ENCOUNTER — Other Ambulatory Visit: Payer: Self-pay

## 2021-09-21 ENCOUNTER — Other Ambulatory Visit
Admission: RE | Admit: 2021-09-21 | Discharge: 2021-09-21 | Disposition: A | Discharge status: Home / Self Care | Payer: Medicare Other | Source: Ambulatory Visit | Attending: *Deleted | Admitting: *Deleted

## 2021-09-21 ENCOUNTER — Telehealth: Payer: Self-pay | Admitting: Pulmonary Disease

## 2021-09-21 ENCOUNTER — Telehealth: Payer: Self-pay | Admitting: Cardiology

## 2021-09-21 ENCOUNTER — Ambulatory Visit (INDEPENDENT_AMBULATORY_CARE_PROVIDER_SITE_OTHER): Payer: Medicare Other | Admitting: Physician Assistant

## 2021-09-21 ENCOUNTER — Other Ambulatory Visit: Payer: Self-pay | Admitting: Internal Medicine

## 2021-09-21 ENCOUNTER — Encounter: Payer: Self-pay | Admitting: Physician Assistant

## 2021-09-21 ENCOUNTER — Telehealth: Payer: Self-pay

## 2021-09-21 ENCOUNTER — Ambulatory Visit
Admission: RE | Admit: 2021-09-21 | Discharge: 2021-09-21 | Disposition: A | Discharge status: Home / Self Care | Payer: Medicare Other | Source: Ambulatory Visit | Attending: *Deleted | Admitting: *Deleted

## 2021-09-21 VITALS — BP 130/90 | HR 71 | Ht 66.0 in | Wt 276.5 lb

## 2021-09-21 DIAGNOSIS — R079 Chest pain, unspecified: Secondary | ICD-10-CM

## 2021-09-21 DIAGNOSIS — R0609 Other forms of dyspnea: Secondary | ICD-10-CM | POA: Diagnosis not present

## 2021-09-21 DIAGNOSIS — R0600 Dyspnea, unspecified: Secondary | ICD-10-CM

## 2021-09-21 DIAGNOSIS — I5189 Other ill-defined heart diseases: Secondary | ICD-10-CM

## 2021-09-21 DIAGNOSIS — I10 Essential (primary) hypertension: Secondary | ICD-10-CM | POA: Insufficient documentation

## 2021-09-21 DIAGNOSIS — R0789 Other chest pain: Secondary | ICD-10-CM

## 2021-09-21 DIAGNOSIS — E7800 Pure hypercholesterolemia, unspecified: Secondary | ICD-10-CM

## 2021-09-21 DIAGNOSIS — I471 Supraventricular tachycardia: Secondary | ICD-10-CM

## 2021-09-21 DIAGNOSIS — R072 Precordial pain: Secondary | ICD-10-CM | POA: Diagnosis not present

## 2021-09-21 DIAGNOSIS — R0602 Shortness of breath: Secondary | ICD-10-CM | POA: Diagnosis not present

## 2021-09-21 DIAGNOSIS — R1310 Dysphagia, unspecified: Secondary | ICD-10-CM

## 2021-09-21 DIAGNOSIS — E042 Nontoxic multinodular goiter: Secondary | ICD-10-CM

## 2021-09-21 DIAGNOSIS — E782 Mixed hyperlipidemia: Secondary | ICD-10-CM

## 2021-09-21 LAB — BASIC METABOLIC PANEL
Anion gap: 8 (ref 5–15)
BUN: 19 mg/dL (ref 8–23)
CO2: 29 mmol/L (ref 22–32)
Calcium: 9.5 mg/dL (ref 8.9–10.3)
Chloride: 102 mmol/L (ref 98–111)
Creatinine, Ser: 0.5 mg/dL (ref 0.44–1.00)
GFR, Estimated: >60 mL/min (ref >60)
Glucose, Bld: 99 mg/dL (ref 70–99)
Potassium: 4.1 mmol/L (ref 3.5–5.1)
Sodium: 139 mmol/L (ref 135–145)

## 2021-09-21 LAB — CBC
HCT: 38.9 % (ref 36.0–46.0)
Hemoglobin: 12.2 g/dL (ref 12.0–15.0)
MCH: 26.3 pg (ref 26.0–34.0)
MCHC: 31.4 g/dL (ref 30.0–36.0)
MCV: 84 fL (ref 80.0–100.0)
Platelets: 292 10*3/uL (ref 150–400)
RBC: 4.63 MIL/uL (ref 3.87–5.11)
RDW: 14.4 % (ref 11.5–15.5)
WBC: 7.4 10*3/uL (ref 4.0–10.5)
nRBC: 0 % (ref 0.0–0.2)

## 2021-09-21 LAB — TROPONIN I (HIGH SENSITIVITY): Troponin I (High Sensitivity): 6 ng/L (ref <18)

## 2021-09-21 MED ORDER — IVABRADINE HCL 5 MG PO TABS
10.0000 mg | ORAL_TABLET | Freq: Once | ORAL | Status: AC
  Administered 2021-09-21: 10 mg via ORAL

## 2021-09-21 MED ORDER — IOHEXOL 350 MG/ML SOLN
100.0000 mL | Freq: Once | INTRAVENOUS | Status: AC | PRN
  Administered 2021-09-21: 100 mL via INTRAVENOUS

## 2021-09-21 MED ORDER — METOPROLOL TARTRATE 12.5 MG HALF TABLET
100.0000 mg | ORAL_TABLET | Freq: Once | ORAL | Status: AC
  Administered 2021-09-21: 100 mg via ORAL

## 2021-09-21 MED ORDER — NITROGLYCERIN 0.4 MG SL SUBL
0.8000 mg | SUBLINGUAL_TABLET | Freq: Once | SUBLINGUAL | Status: AC
  Administered 2021-09-21: 0.8 mg via SUBLINGUAL

## 2021-09-21 NOTE — Telephone Encounter (Signed)
Patient seen by Christell Faith today and sent for a CCTA. Closing encounter. ?

## 2021-09-21 NOTE — Progress Notes (Signed)
Patient scheduled for same day imaging over at the [facility] on [address]. 100 mg metoprolol and 10 mg of Corlanor given to patient.  ? ?Medication Samples have been provided to the patient. ? ?Drug name: Corlanor        ?Strength: 5 mg         ?Qty: 1 bottle   ?LOT: [id number]   ?Exp.Date: [id number] ? ?2 pills (10 mg) were administered to patient and 12 tablets wasted with Darlyne Russian RN ? ?

## 2021-09-21 NOTE — Telephone Encounter (Signed)
Pt called to advise she is experiencing difficulty swallowing and SOB since biopsy. She contacted surgeon and they advised her to contact our office.  ?

## 2021-09-21 NOTE — Telephone Encounter (Signed)
Called and spoke with patient, she states she has a 4 cm nodule on her thyroid.  On March 28th, she had a bx on it.  She never had any trouble breathing or swallowing until after the biopsy.  She was told it was normal after the biopsy and to keep ice on it.  She kept ice on it like she was told to do.  She said the last 2 nights have been really bad, she cannot lay down to sleep.  Her sats are 96.  She was told it could be her heart so she saw her heart doctor today, they did labs, a CT of the heart and she is having an echo next week.  The doctor that did the procedure told her first the biopsy was inconclusive and then was told it was benign, but still has to be removed.  She has been scheduled to see a Psychologist, sport and exercise.  She is worried that it may be her lungs. ? ?Dr. Patsey Berthold, ?Please advise.  Thank you. ?

## 2021-09-21 NOTE — Progress Notes (Signed)
Patient tolerated procedure well. Ambulate w/o difficulty. Denies light headedness or being dizzy. Sitting in chair drinking water provided. Encouraged to drink extra water today and reasoning explained. Verbalized understanding. All questions answered. ABC intact. No further needs. Discharge from procedure area w/o issues.   °

## 2021-09-21 NOTE — Telephone Encounter (Signed)
Called and spoke with patient, advised of recommendations per Dr. Patsey Berthold.  She just had a CT of the heart.  She said if it continues, she will go to the ED.  Nothing further needed. ?

## 2021-09-21 NOTE — Telephone Encounter (Signed)
Given the temporal relationship with her recent procedure, I am inclined to think that this is related to her procedure.  She should not be experiencing swallowing issues with lung or cardiac problems.  She should be evaluated in the emergency room she may need a CT of the neck to make sure there is not a hematoma causing compression in her neck.  ?

## 2021-09-21 NOTE — Progress Notes (Signed)
? ?Cardiology Office Note   ? ?Date:  09/21/2021  ? ?ID:  Kimberly Patton, DOB [birth year], MRN [id number] ? ?PCP:  Jinny Sanders, MD  ?Cardiologist:  [name], MD  ?Electrophysiologist:  None  ? ?Chief Complaint: Dyspnea/chest tightness ? ?History of Present Illness:  ? ?Kimberly Patton is a 66 y.o. female with history of paroxysmal SVT, diastolic dysfunction, HTN, iron deficiency anemia, hiatal hernia, thyroid nodule status post recent biopsy, moderate asthma, hiatal hernia status post mesh surgical repair, severe (by her report) GERD, and obesity status post gastric sleeve who presents for evaluation of 1 week history of dyspnea and chest tightness. ? ?Remote echo in 2013 showed an EF of 55 to 65%, normal wall motion, grade 1 diastolic dysfunction, mild mitral regurgitation, and normal RV systolic function.  Nuclear stress test at that time showed no evidence of ischemia.  Most recent echo from 01/2020 showed an EF of 60 to 65%, no regional wall motion abnormalities, grade 1 diastolic dysfunction, normal RV systolic function and ventricular cavity size, and mildly elevated PASP estimated at 40.5 mmHg.  Nuclear stress test at that time showed no evidence of ischemia with a normal LVSF and was overall low risk.  No significant aortic atherosclerosis or coronary artery calcification was identified.  Outpatient cardiac monitoring in 03/2020 showed a predominant rhythm of sinus with an average rate of 71 bpm (range 43 to 193 bpm), 1 run of NSVT lasting 4 beats, occasional episodes of SVT were identified and associated with patient triggered events.  PFTs in 01/2021 consistent with moderate asthma. ? ?She was last seen in the office in 07/2021 and was doing well without symptoms of angina or decompensation.  At that time, it was noted she had been under increased stress, having been diagnosed with a breast mass that was found to be noncancerous, colonoscopy showing multiple polyps that were resected and did not  reveal cancer, and "throat issues" having been diagnosed with a goiter/thyroid nodule that increased in size.  No changes were indicated and annual follow-up was recommended. ? ?She recently underwent a biopsy of her thyroid for nodules on 09/05/2021.  Following this, she contacted her surgeon's office noting issues with swallowing.  She contacted our office this morning noting intermittent chest tightness and shortness of breath for 1 week. ? ?She comes in today noting a 1 week history of chest tightness along with some right sided chest discomfort and exertional dyspnea.  She has also had some mild left upper extremity discomfort.  At baseline, she sleeps with 2 pillow orthopnea secondary to chronic neck pain, however over the past week she has had to sleep in a recliner due to orthopnea.  Last evening, she did try to sleep in a bed with 4-5 pillows, though could not do so secondary to neck pain.  She has not noted any significant lower extremity swelling or weight gain.  She does report having had significant swelling following her thyroid biopsy, though this improved with ice.  She did not note any significant erythema or ecchymosis of the neck.  Last evening she did have difficulty swallowing chicken and this morning reported difficulty in swallowing a calcium tab. ? ? ?Labs independently reviewed: ?08/2021 - TSH normal ?07/2021 - TC 168, TG 88, HDL 67, LDL 85 ?02/2021 - potassium 4.2, BUN 22, serum creatinine 0.46 ?10/2020 - albumin 4.0, AST/ALT normal, Hgb 13.1, PLT 293 ?08/2020 - A1c 5.1 ? ?Past Medical History:  ?Diagnosis Date  ? ALLERGIC RHINITIS 04/03/2007  ?  Allergy   ? Not sure  ? Anemia   ? ((Pt Qnr Sub: Denies at visit from 08/28/2021 with endocrinology))   ? Anxiety   ? Arthritis Not sure  ? Asthma   ? hx of years ago - no longer a problem   ? ASTHMA, PERSISTENT, MODERATE 04/03/2007  ? Cancer [facility])   ? skin   ? Dyspnea on exertion   ? a. 11/2007 Echo: EF 60%.  ? Dysrhythmia   ? hx of heart arrhythmia 10-15  years ago - followed by DR Lovena Le - not seen in years   ? GERD (gastroesophageal reflux disease)   ? 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 Hypertension   ? Joint pain   ? HIPS / LEGS  ? Knee injury   ? RT  ? Meningioma ([place])   ? Morbid obesity ([place])   ? Neuromuscular disorder [facility]) Not sure, since my fall  ? Paroxysmal SVT (supraventricular tachycardia) (HCC)   ? a. 11/2011 48h Holter: RSR, rare PVC's, occas PAC's  ? Retinal tear of right eye 01/2016  ? Ventral hernia   ? ? ?Past Surgical History:  ?Procedure Laterality Date  ? ABDOMINAL HYSTERECTOMY  Think 1994  ? APPENDECTOMY  1996  ? BREAST CYST EXCISION Right 1994  ? Benign  ? CESAREAN SECTION    ? x2  ? CHOLECYSTECTOMY  1995  ? COLONOSCOPY WITH PROPOFOL N/A 05/09/2021  ? Procedure: COLONOSCOPY WITH PROPOFOL;  Surgeon: [name], MD;  Location: [facility] ENDOSCOPY;  Service: Gastroenterology;  Laterality: N/A;  ? EYE SURGERY  01/2016  ? Repair retinal tear  ? FOOT SURGERY  1995 / 1996  ? x2  ? HERNIA REPAIR    ? KNEE ARTHROSCOPY W/ MENISCAL REPAIR  07/25/2013  ? right knee Dr. Mardelle Matte  ? LAPAROSCOPIC GASTRIC SLEEVE RESECTION N/A 06/08/2013  ? Procedure: LAPAROSCOPIC GASTRIC SLEEVE RESECTION AND EXCISION OF SEBACEUS CYST FROM MID CHEST takedown of incarcerated ventral hernia and primary repair endoscopy;  Surgeon: [name], MD;  Location: [facility] ORS;  Service: General;  Laterality: N/A;  ? LAPAROSCOPIC NISSEN FUNDOPLICATION N/A 34/19/3790  ? Procedure: LAPAROSCOPIC REPAIR LARGE SYMPTOMATIC HIATAL HERNIA WITH UPPER ENDOSCOPY;  Surgeon: Johnathan Hausen, MD;  Location: [facility] ORS;  Service: General;  Laterality: N/A;  ? moles  06/2013  ? removed 2 moles from under arm and  lowback  ? NECK SURGERY    ? occipital nerve damage- injections   ? OVARIAN CYST REMOVAL  1970  ? PILONIDAL CYST EXCISION  1975  ? RADIOACTIVE SEED GUIDED EXCISIONAL BREAST BIOPSY Right 06/23/2021  ? Procedure: RADIOACTIVE SEED GUIDED EXCISIONAL RIGHT BREAST BIOPSY;  Surgeon: Johnathan Hausen, MD;  Location: [place];  Service: General;  Laterality: Right;  ? RETINAL TEAR REPAIR CRYOTHERAPY Right 01/2016  ? [place]  ? SKIN CANCER EXCISION    ? TONSILLECTOMY    ? TUBAL LIGATION  Think 1994  ? UPPER GI ENDOSCOPY  06/08/2013  ? Procedure: UPPER GI ENDOSCOPY;  Surgeon: [name], MD;  Location: [facility];  Service: General;;  ? US ECHOCARDIOGRAPHY  12/2011  ? WNL - EF 55-60%, mild MR, grade 1 diastolic dysnfiction (mild)  ? VENTRAL HERNIA REPAIR  2000  ? ? ?Current Medications: ?Current Meds  ?Medication Sig  ? augmented betamethasone dipropionate (DIPROLENE-AF) 0.05 % ointment Apply topically.  ? Calcium Citrate-Vitamin D (CITRACAL + D PO) Take 2 tablets by mouth daily.   ? Cholecalciferol (VITAMIN D3) 1.25 MG (50000 UT) CAPS Take 1 capsule  by mouth once a week.  ? Cyanocobalamin (VITAMIN B-12) 5000 MCG SUBL Place 5,000 mcg under the tongue daily.  ? diltiazem (CARDIZEM SR) 120 MG 12 hr capsule Take 1 capsule (120 mg total) by mouth 2 (two) times daily.  ? ELDERBERRY PO Take 1,000 mg by mouth daily.   ? fluticasone (FLONASE) 50 MCG/ACT nasal spray SPRAY 2 SPRAYS INTO EACH NOSTRIL EVERY DAY  ? gabapentin (NEURONTIN) 300 MG capsule Take 300 mg by mouth 2 (two) times daily.  ? Lactobacillus Rhamnosus, GG, (CULTURELLE IMMUNITY SUPPORT PO) Take by mouth.  ? Magnesium 500 MG TABS   ? Multiple Vitamins-Minerals (CENTRUM SILVER 50+WOMEN PO) Take by mouth.  ? nitroGLYCERIN (NITROSTAT) 0.4 MG SL tablet Place 0.4 mg under the tongue every 5 (five) minutes as needed for chest pain.  ? Omega-3 Fatty Acids (FISH OIL) 1200 MG CAPS Take by mouth.  ? pantoprazole (PROTONIX) 40 MG tablet Take 1 tablet (40 mg total) by mouth daily.  ? Riboflavin 100 MG CAPS   ? rosuvastatin (CRESTOR) 10 MG tablet Take 1 tablet (10 mg total) by mouth daily.  ? TURMERIC PO Take by mouth.  ? UBIQUINOL PO Take 100 mg by mouth daily at 12 noon.  ? ? ?Allergies:   Wasp venom, Chlorhexidine gluconate, Metronidazole, Prednisone, and Sodium hypochlorite   ? ?Social History  ? ?Socioeconomic History  ? Marital status: Divorced  ?  Spouse name: Not on file  ? Number of children: 2  ? Years of education: 60  ? Highest education level: Not on file  ?[place]

## 2021-09-21 NOTE — Patient Instructions (Signed)
Medication Instructions:  ?No changes at this time.  ? ?*If you need a refill on your cardiac medications before your next appointment, please call your pharmacy* ? ? ?Lab Work: ?STAT CBC, BMET, Troponin ? ?If you have labs (blood work) drawn today and your tests are completely normal, you will receive your results only by: ?MyChart Message (if you have MyChart) OR ?A paper copy in the mail ?If you have any lab test that is abnormal or we need to change your treatment, we will call you to review the results. ? ? ?Testing/Procedures: ?Chest X ray over at the medical mall with Labs ? ?Your physician has requested that you have an echocardiogram. Echocardiography is a painless test that uses sound waves to create images of your heart. It provides your doctor with information about the size and shape of your heart and how well your heart?s chambers and valves are working. This procedure takes approximately one hour. There are no restrictions for this procedure. ? ? ? ?Your cardiac CT will be scheduled at one of the below locations:  ? ? ?[facility] [address] ?[address] ?[place], [place] [address] ?[phone] ? ? ?If scheduled at [facility], please arrive 15 mins early for check-in and test prep. ? ?Please follow these instructions carefully (unless otherwise directed): ? ?Hold all erectile dysfunction medications at least 3 days (72 hrs) prior to test. ? ?On the Night Before the Test: ?Be sure to Drink plenty of water. ?Do not consume any caffeinated/decaffeinated beverages or chocolate 12 hours prior to your test. ?Do not take any antihistamines 12 hours prior to your test. ? ? ?On the Day of the Test: ?Drink plenty of water until 1 hour prior to the test. ?Do not eat any food 4 hours prior to the test. ?You may take your regular medications prior to the test.  ?HOLD Furosemide/Hydrochlorothiazide morning of the test. ?FEMALES- please wear  underwire-free bra if available, avoid dresses & tight clothing ? ? ?     ?After the Test: ?Drink plenty of water. ?After receiving IV contrast, you may experience a mild flushed feeling. This is normal. ?On occasion, you may experience a mild rash up to 24 hours after the test. This is not dangerous. If this occurs, you can take Benadryl 25 mg and increase your fluid intake. ?If you experience trouble breathing, this can be serious. If it is severe call 911 IMMEDIATELY. If it is mild, please call our office. ?If you take any of these medications: Glipizide/Metformin, Avandament, Glucavance, please do not take 48 hours after completing test unless otherwise instructed. ? ? ? ? ?Follow-Up: ?At [facility], you and your health needs are our priority.  As part of our continuing mission to provide you with exceptional heart care, we have created designated Provider Care Teams.  These Care Teams include your primary Cardiologist (physician) and Advanced Practice Providers (APPs -  Physician Assistants and Nurse Practitioners) who all work together to provide you with the care you need, when you need it. ? ? ?Your next appointment:   ?Follow up after testing. ? ?The format for your next appointment:   ?In Person ? ?Provider:   ?[name], MD or Christell Faith, PA-C  ? ? ?Important Information About Sugar ? ? ? ? ?  ?

## 2021-09-21 NOTE — Telephone Encounter (Signed)
Pt c/o Shortness Of Breath: STAT if SOB developed within the last 24 hours or pt is noticeably SOB on the phone ? ?1. Are you currently SOB (can you hear that pt is SOB on the phone)? yes ? ?2. How long have you been experiencing SOB? Off and on for about a week  ? ?3. Are you SOB when sitting or when up moving around? Cannot lay flat - and when walking around  ? ?4. Are you currently experiencing any other symptoms? Chest tightness when SOB  ? ? ?Scheduled to see R Dunn today 04/13 at 10:55a ?

## 2021-09-21 NOTE — Telephone Encounter (Signed)
I called and discussed with patient.  After the thyroid biopsy she developed problems with shortness of breath but yesterday she also developed problems swallowing large pills.  She also has shortness of breath with exertion.  Today she had cardiac and pulmonary investigation and the results were negative for chest pathology.  Results reviewed (chest x-ray images, cardiac CT scan images, troponin level) with the patient.  At this point, there is a concern about a possible hematoma after her neck biopsy.  I placed an order for a neck CT without contrast per her preference at [facility] outpatient radiology clinic.  I did advise her that if this cannot be done in time, she may need to go to the emergency room tomorrow to have this imaging test.  Patient agrees with the plan. ?

## 2021-09-22 ENCOUNTER — Other Ambulatory Visit: Payer: Self-pay | Admitting: Physician Assistant

## 2021-09-22 ENCOUNTER — Telehealth: Payer: Self-pay | Admitting: *Deleted

## 2021-09-22 DIAGNOSIS — I471 Supraventricular tachycardia: Secondary | ICD-10-CM

## 2021-09-22 DIAGNOSIS — I5189 Other ill-defined heart diseases: Secondary | ICD-10-CM

## 2021-09-22 DIAGNOSIS — R0609 Other forms of dyspnea: Secondary | ICD-10-CM

## 2021-09-22 DIAGNOSIS — R0789 Other chest pain: Secondary | ICD-10-CM

## 2021-09-22 DIAGNOSIS — R072 Precordial pain: Secondary | ICD-10-CM

## 2021-09-22 NOTE — Telephone Encounter (Signed)
Called patients to review results and she requested that I call her back as she was playing cards. Advised I would call her back later today.  ?

## 2021-09-22 NOTE — Telephone Encounter (Signed)
Reviewed results and recommendations with patient and she verbalized understanding with no further questions at this time.  

## 2021-09-22 NOTE — Telephone Encounter (Signed)
-----   Message from Rise Mu, PA-C sent at 09/21/2021  4:54 PM EDT ----- ?Noncardiac over read: ?-No acute findings ? ?Cardiac overread: ?-Calcium score 58.9 which is the 76 percentile ?-Less than 25% stenosis involving the LAD and RCA RCA ? ?Recommendations: ?-No evidence of obstructive blockages contributing to symptoms ?-Continue with planned echo with further recommendations based on these results ?-Aggressive risk factor modification is encouraged ?-We can discuss potential medication escalation in follow-up ?

## 2021-09-22 NOTE — Telephone Encounter (Signed)
-----   Message from Rise Mu, PA-C sent at 09/21/2021 12:57 PM EDT ----- ?Please inform the patient her labs are normal, including high-sensitivity troponin.  Chest x-ray also showed no acute cardiopulmonary process, with possible minimal enlargement of the cardiac silhouette which can be further evaluated by echo.  Await echo and coronary CTA. ?

## 2021-09-25 DIAGNOSIS — H00011 Hordeolum externum right upper eyelid: Secondary | ICD-10-CM | POA: Diagnosis not present

## 2021-09-26 DIAGNOSIS — E041 Nontoxic single thyroid nodule: Secondary | ICD-10-CM | POA: Diagnosis not present

## 2021-09-28 ENCOUNTER — Ambulatory Visit (INDEPENDENT_AMBULATORY_CARE_PROVIDER_SITE_OTHER): Payer: Medicare Other

## 2021-09-28 DIAGNOSIS — R0609 Other forms of dyspnea: Secondary | ICD-10-CM | POA: Diagnosis not present

## 2021-09-28 DIAGNOSIS — R0789 Other chest pain: Secondary | ICD-10-CM | POA: Diagnosis not present

## 2021-09-28 DIAGNOSIS — I5189 Other ill-defined heart diseases: Secondary | ICD-10-CM | POA: Diagnosis not present

## 2021-09-28 DIAGNOSIS — I471 Supraventricular tachycardia: Secondary | ICD-10-CM

## 2021-09-28 DIAGNOSIS — R072 Precordial pain: Secondary | ICD-10-CM

## 2021-09-28 LAB — ECHOCARDIOGRAM COMPLETE
AR max vel: 3.12 cm2
AV Area VTI: 3.42 cm2
AV Area mean vel: 2.82 cm2
AV Mean grad: 5 mmHg
AV Peak grad: 10.5 mmHg
Ao pk vel: 1.62 m/s
Area-P 1/2: 2.88 cm2
S' Lateral: 2.7 cm
Single Plane A4C EF: 55.5 %

## 2021-10-02 ENCOUNTER — Telehealth: Payer: Self-pay | Admitting: *Deleted

## 2021-10-02 NOTE — Telephone Encounter (Signed)
Reviewed results with patient and she verbalized understanding with no further questions at this time.  

## 2021-10-02 NOTE — Telephone Encounter (Signed)
-----   Message from Rise Mu, PA-C sent at 09/28/2021  4:44 PM EDT ----- ?Echo showed a normal pump function, normal wall motion, normal relaxation of the heart, and a trivially leaky mitral valve. Overall, reassuring echo.  ?

## 2021-10-02 NOTE — Telephone Encounter (Signed)
Left voicemail message to call back for review of results.  

## 2021-10-09 ENCOUNTER — Encounter (INDEPENDENT_AMBULATORY_CARE_PROVIDER_SITE_OTHER): Payer: Self-pay | Admitting: Vascular Surgery

## 2021-10-09 ENCOUNTER — Ambulatory Visit (INDEPENDENT_AMBULATORY_CARE_PROVIDER_SITE_OTHER): Payer: Medicare Other | Admitting: Vascular Surgery

## 2021-10-09 VITALS — BP 149/86 | HR 74 | Resp 17

## 2021-10-09 DIAGNOSIS — I872 Venous insufficiency (chronic) (peripheral): Secondary | ICD-10-CM

## 2021-10-09 DIAGNOSIS — I10 Essential (primary) hypertension: Secondary | ICD-10-CM | POA: Diagnosis not present

## 2021-10-09 DIAGNOSIS — I83893 Varicose veins of bilateral lower extremities with other complications: Secondary | ICD-10-CM | POA: Diagnosis not present

## 2021-10-09 DIAGNOSIS — I25118 Atherosclerotic heart disease of native coronary artery with other forms of angina pectoris: Secondary | ICD-10-CM | POA: Diagnosis not present

## 2021-10-09 DIAGNOSIS — E7800 Pure hypercholesterolemia, unspecified: Secondary | ICD-10-CM

## 2021-10-09 NOTE — Progress Notes (Signed)
? ? ? ? ?MRN : [id number] ? ?Kimberly Patton is a 66 y.o. (1955/08/19) female who presents with chief complaint of varicose veins hurt. ? ?History of Present Illness:  ?The patient returns to the office for followup status post laser ablation of the left great saphenous vein on 08/17/2021. The patient notes multiple residual varicosities bilaterally which continued to hurt with dependent positions and remained tender to palpation. The patient's swelling is unchanged from preoperative status. The patient continues to wear graduated compression stockings on a daily basis but these are not eliminating the pain and discomfort. The patient continues to use over-the-counter anti-inflammatory medications to treat the pain and related symptoms but this has not given the patient relief. The patient notes the pain in the lower extremities is causing problems with daily exercise, problems at work and even with household activities such as preparing meals and doing dishes. ? ?The patient is otherwise done well and there have been no complications related to the laser procedure or interval changes in the patient's overall  ? ?Venous ultrasound post laser dated 08/24/2021, shows successful laser ablation of the left GSV, no DVT identified.  ? ?No outpatient medications have been marked as taking for the 10/09/21 encounter (Office Visit) with Delana Meyer, Dolores Lory, MD.  ? ? ?Past Medical History:  ?Diagnosis Date  ? ALLERGIC RHINITIS 04/03/2007  ? Allergy   ? Not sure  ? Anemia   ? ((Pt Qnr Sub: Denies at visit from 08/28/2021 with endocrinology))   ? Anxiety   ? Arthritis Not sure  ? Asthma   ? hx of years ago - no longer a problem   ? ASTHMA, PERSISTENT, MODERATE 04/03/2007  ? Cancer [facility])   ? skin   ? Dyspnea on exertion   ? a. 11/2007 Echo: EF 60%.  ? Dysrhythmia   ? hx of heart arrhythmia 10-15 years ago - followed by DR Lovena Le - not seen in years   ? GERD (gastroesophageal reflux disease)   ? 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 Hypertension   ?  Joint pain   ? HIPS / LEGS  ? Knee injury   ? RT  ? Meningioma ([place])   ? Morbid obesity ([place])   ? Neuromuscular disorder [facility]) Not sure, since my fall  ? Paroxysmal SVT (supraventricular tachycardia) (HCC)   ? a. 11/2011 48h Holter: RSR, rare PVC's, occas PAC's  ? Retinal tear of right eye 01/2016  ? Ventral hernia   ? ? ?Past Surgical History:  ?Procedure Laterality Date  ? ABDOMINAL HYSTERECTOMY  Think 1994  ? APPENDECTOMY  1996  ? BREAST CYST EXCISION Right 1994  ? Benign  ? CESAREAN SECTION    ? x2  ? CHOLECYSTECTOMY  1995  ? COLONOSCOPY WITH PROPOFOL N/A 05/09/2021  ? Procedure: COLONOSCOPY WITH PROPOFOL;  Surgeon: [name], MD;  Location[facility] ENDOSCOPY;  Service: Gastroenterology;  Laterality: N/A;  ? EYE SURGERY  01/2016  ? Repair retinal tear  ? FOOT SURGERY  1995 / 1996  ? x2  ? HERNIA REPAIR    ? KNEE ARTHROSCOPY W/ MENISCAL REPAIR  07/25/2013  ? right knee Dr. Mardelle Matte  ? LAPAROSCOPIC GASTRIC SLEEVE RESECTION N/A 06/08/2013  ? Procedure: LAPAROSCOPIC GASTRIC SLEEVE RESECTION AND EXCISION OF SEBACEUS CYST FROM MID CHEST takedown of incarcerated ventral hernia and primary repair endoscopy;  Surgeon: [name], MD;  Location: [facility];  Service: General;  Laterality: N/A;  ? LAPAROSCOPIC NISSEN FUNDOPLICATION N/A 21/19/4174  ? Procedure: LAPAROSCOPIC REPAIR LARGE  SYMPTOMATIC HIATAL HERNIA WITH UPPER ENDOSCOPY;  Surgeon: Johnathan Hausen, MD;  Location: [facility];  Service: General;  Laterality: N/A;  ? moles  06/2013  ? removed 2 moles from under arm and  lowback  ? NECK SURGERY    ? occipital nerve damage- injections   ? OVARIAN CYST REMOVAL  1970  ? PILONIDAL CYST EXCISION  1975  ? RADIOACTIVE SEED GUIDED EXCISIONAL BREAST BIOPSY Right 06/23/2021  ? Procedure: RADIOACTIVE SEED GUIDED EXCISIONAL RIGHT BREAST BIOPSY;  Surgeon: Johnathan Hausen, MD;  Location: [place];  Service: General;  Laterality: Right;  ? RETINAL TEAR REPAIR CRYOTHERAPY Right 01/2016  ? [place]  ? SKIN CANCER EXCISION     ? TONSILLECTOMY    ? TUBAL LIGATION  Think 1994  ? UPPER GI ENDOSCOPY  06/08/2013  ? Procedure: UPPER GI ENDOSCOPY;  Surgeon: [name], MD;  Location: [facility];  Service: General;;  ? US ECHOCARDIOGRAPHY  12/2011  ? WNL - EF 55-60%, mild MR, grade 1 diastolic dysnfiction (mild)  ? VENTRAL HERNIA REPAIR  2000  ? ? ?Social History ?Social History  ? ?Tobacco Use  ? Smoking status: Never  ? Smokeless tobacco: Never  ? Tobacco comments:  ?  smoke at age [age]  ?Vaping Use  ? Vaping Use: Never used  ?Substance Use Topics  ? Alcohol use: Not Currently  ? Drug use: No  ? ? ?Family History ?Family History  ?Problem Relation Age of Onset  ? Breast cancer Mother   ? Diabetes Mother   ? Hypertension Mother   ? Kidney failure Mother   ? Diabetes Father   ? Hypertension Father   ? Diabetes Other   ? Hypertension Other   ? Stroke Other   ? Hemophilia Other   ? ? ?Allergies  ?Allergen Reactions  ? Wasp Venom Anaphylaxis  ? Chlorhexidine Gluconate   ?  Itching under breasts  ? Metronidazole Other (See Comments)  ?  Chest tightness, neck tightness  ? Prednisone Other (See Comments)  ?  Severe migraine / with taper dose  ? Sodium Hypochlorite Rash  ?  Liquid clorox bleach  ? ? ? ?REVIEW OF SYSTEMS (Negative unless checked) ? ?Constitutional: '[]'$ Weight loss  '[]'$ Fever  '[]'$ Chills ?Cardiac: '[]'$ Chest pain   '[]'$ Chest pressure   '[]'$ Palpitations   '[]'$ Shortness of breath when laying flat   '[]'$ Shortness of breath with exertion. ?Vascular:  '[]'$ Pain in legs with walking   '[x]'$ Pain in legs with standing  '[]'$ History of DVT   '[]'$ Phlebitis   '[]'$ Swelling in legs   '[x]'$ Varicose veins   '[]'$ Non-healing ulcers ?Pulmonary:   '[]'$ Uses home oxygen   '[]'$ Productive cough   '[]'$ Hemoptysis   '[]'$ Wheeze  '[]'$ COPD   '[]'$ Asthma ?Neurologic:  '[]'$ Dizziness   '[]'$ Seizures   '[]'$ History of stroke   '[]'$ History of TIA  '[]'$ Aphasia   '[]'$ Vissual changes   '[]'$ Weakness or numbness in arm   '[]'$ Weakness or numbness in leg ?Musculoskeletal:   '[]'$ Joint swelling   '[]'$ Joint pain   '[]'$ Low back pain ?Hematologic:   '[]'$ Easy bruising  '[]'$ Easy bleeding   '[]'$ Hypercoagulable state   '[]'$ Anemic ?Gastrointestinal:  '[]'$ Diarrhea   '[]'$ Vomiting  '[]'$ Gastroesophageal reflux/heartburn   '[]'$ Difficulty swallowing. ?Genitourinary:  '[]'$ Chronic kidney disease   '[]'$ Difficult urination  '[]'$ Frequent urination   '[]'$ Blood in urine ?Skin:  '[]'$ Rashes   '[]'$ Ulcers  ?Psychological:  '[]'$ History of anxiety   '[]'$  History of major depression. ? ?Physical Examination ? ?There were no vitals filed for this visit. ?There is no height or weight on file to  calculate BMI. ?Gen: WD/WN, NAD ?Head: [place]/AT, No temporalis wasting.  ?Ear/Nose/Throat: Hearing grossly intact, nares w/o erythema or drainage, pinna without lesions ?Eyes: PER, EOMI, sclera nonicteric.  ?Neck: Supple, no gross masses.  No JVD.  ?Pulmonary:  Good air movement, no audible wheezing, no use of accessory muscles.  ?Cardiac: RRR, precordium not hyperdynamic. ?Vascular:  Large varicosities present, greater than 10 mm bilateral.  Veins are tender to palpation  Moderate venous stasis changes to the legs bilaterally.  Trace soft pitting edema  ?Vessel Right Left  ?Radial Palpable Palpable  ?Gastrointestinal: soft, non-distended. No guarding/no peritoneal signs.  ?Musculoskeletal: M/S 5/5 throughout.  No deformity.  ?Neurologic: CN 2-12 intact. Pain and light touch intact in extremities.  Symmetrical.  Speech is fluent. Motor exam as listed above. ?Psychiatric: Judgment intact, Mood & affect appropriate for pt's clinical situation. ?Dermatologic: Venous rashes no ulcers noted.  No changes consistent with cellulitis. ?Lymph : No lichenification or skin changes of chronic lymphedema. ? ?CBC ?Lab Results  ?Component Value Date  ? WBC 7.4 09/21/2021  ? HGB 12.2 09/21/2021  ? HCT 38.9 09/21/2021  ? MCV 84.0 09/21/2021  ? PLT 292 09/21/2021  ? ? ?BMET ?   ?Component Value Date/Time  ? NA 139 09/21/2021 1222  ? NA 139 01/21/2020 1137  ? K 4.1 09/21/2021 1222  ? CL 102 09/21/2021 1222  ? CO2 29 09/21/2021 1222  ? GLUCOSE 99  09/21/2021 1222  ? BUN 19 09/21/2021 1222  ? BUN 16 01/21/2020 1137  ? CREATININE 0.50 09/21/2021 1222  ? CREATININE 0.57 01/01/2020 1623  ? CALCIUM 9.5 09/21/2021 1222  ? GFRNONAA >60 09/21/2021 1222  ? G

## 2021-10-10 ENCOUNTER — Encounter: Payer: Self-pay | Admitting: Family Medicine

## 2021-10-10 ENCOUNTER — Encounter: Payer: Self-pay | Admitting: Pulmonary Disease

## 2021-10-10 ENCOUNTER — Telehealth: Payer: Self-pay

## 2021-10-10 ENCOUNTER — Other Ambulatory Visit: Payer: Self-pay

## 2021-10-10 ENCOUNTER — Ambulatory Visit (INDEPENDENT_AMBULATORY_CARE_PROVIDER_SITE_OTHER): Payer: Medicare Other | Admitting: Family Medicine

## 2021-10-10 VITALS — BP 106/70 | HR 62 | Temp 97.5°F | Ht 66.0 in | Wt 275.1 lb

## 2021-10-10 DIAGNOSIS — M810 Age-related osteoporosis without current pathological fracture: Secondary | ICD-10-CM | POA: Diagnosis not present

## 2021-10-10 DIAGNOSIS — Z9889 Other specified postprocedural states: Secondary | ICD-10-CM | POA: Insufficient documentation

## 2021-10-10 DIAGNOSIS — N644 Mastodynia: Secondary | ICD-10-CM | POA: Insufficient documentation

## 2021-10-10 MED ORDER — ALBUTEROL SULFATE HFA 108 (90 BASE) MCG/ACT IN AERS: 2.0000 | INHALATION_SPRAY | Freq: Four times a day (QID) | RESPIRATORY_TRACT | 2 refills | Status: DC | PRN

## 2021-10-10 NOTE — Assessment & Plan Note (Signed)
Chronic, worsening ? reviewed prolia in detail with patient and will begin process for initiation. ?

## 2021-10-10 NOTE — Assessment & Plan Note (Signed)
No clear breast abnormality. ? Most consistent with chest wall pain. ? treat with tylenol, and gentle stretching. Stop resistance band work at PT and modify. ?

## 2021-10-10 NOTE — Telephone Encounter (Signed)
Kimberly Sanders, MD  Kris Mouton, CMA ? Please move forward with setting this patient up for Prolia.  ? ? ?Benefits submitted-waiting. New start ?

## 2021-10-10 NOTE — Progress Notes (Signed)
? ? Patient ID: Kimberly Patton, female    DOB: 04-09-1956, 66 y.o.   MRN: [id number] ? ?This visit was conducted in person. ? ?BP 106/70   Pulse 62   Temp (!) 97.5 ?F (36.4 ?C) (Oral)   Ht '5\' 6"'$  (1.676 m)   Wt 275 lb 1 oz (124.8 kg)   SpO2 96%   BMI 44.40 kg/m?   ? ?CC:  ?Chief Complaint  ?Patient presents with  ? Breast Pain  ?  Right  ? Osteoporosis  ?  Wants to move ahead with Prolia injection  ? ? ?Subjective:  ? ?HPI: ?NECIE WILCOXSON is a 66 y.o. female presenting on 10/10/2021 for Breast Pain (Right) and Osteoporosis (Wants to move ahead with Prolia injection) ? ? ? She is doing PT 2 days a week for her neck pain. ? ? Right breast pain x 2  week.. feeling in area where lumpectomy was... intermittent lasting 1-2 hours. ? Improved with resting, massage. ? Area feels tight, not sharp stabbing pain. ? No new lumps.. has fibrocystic changes in breasts. ? Worse with exercise, pulling resistance bands for neck PT. ? No chest pain ?She has gained weight. ? ? No redness of skin, no skin changes. ? ? S/P  sterotactic biopsy right breast in 06/2021 Dr. Hassell Done:  ? Lumpectomy performed... following this no remaining soreness. ?Pathology showed:  complex sclerosing lesion with associated proliferative fibrocystic changes. Negative for carcinoma. ? Last mammogram:  04/2022 : focal ?asymmetry with associated possible subtle architectural distortion ?located in the lower inner quadrant of the RIGHT breast. ?   ?Osteoporosis:  GERD and  S/P hiatal hernia repair.Marland Kitchen bisphosphonate oral contraindicated. ?  No history of immunocompromising health issues  or low calcium... will proceed with referral  for prolia. ? ? ?Relevant past medical, surgical, family and social history reviewed and updated as indicated. Interim medical history since our last visit reviewed. ?Allergies and medications reviewed and updated. ?Outpatient Medications Prior to Visit  ?Medication Sig Dispense Refill  ? augmented betamethasone dipropionate  (DIPROLENE-AF) 0.05 % ointment Apply topically.    ? beclomethasone (QVAR REDIHALER) 40 MCG/ACT inhaler Inhale 2 puffs into the lungs 2 (two) times daily. 1 each 5  ? Calcium Citrate-Vitamin D (CITRACAL + D PO) Take 2 tablets by mouth daily.     ? Cholecalciferol (VITAMIN D3) 1.25 MG (50000 UT) CAPS Take 1 capsule by mouth once a week. 12 capsule 3  ? Cyanocobalamin (VITAMIN B-12) 5000 MCG SUBL Place 5,000 mcg under the tongue daily.    ? diltiazem (CARDIZEM SR) 120 MG 12 hr capsule Take 1 capsule (120 mg total) by mouth 2 (two) times daily. 60 capsule 9  ? ELDERBERRY PO Take 1,000 mg by mouth daily.     ? fluticasone (FLONASE) 50 MCG/ACT nasal spray SPRAY 2 SPRAYS INTO EACH NOSTRIL EVERY DAY 48 mL 1  ? gabapentin (NEURONTIN) 300 MG capsule Take 300 mg by mouth 2 (two) times daily.    ? Lactobacillus Rhamnosus, GG, (CULTURELLE IMMUNITY SUPPORT PO) Take by mouth.    ? Magnesium 500 MG TABS     ? Multiple Vitamins-Minerals (CENTRUM SILVER 50+WOMEN PO) Take by mouth.    ? nitroGLYCERIN (NITROSTAT) 0.4 MG SL tablet Place 0.4 mg under the tongue every 5 (five) minutes as needed for chest pain.    ? Omega-3 Fatty Acids (FISH OIL) 1200 MG CAPS Take by mouth.    ? pantoprazole (PROTONIX) 40 MG tablet Take 1 tablet (40 mg total)  by mouth daily. 90 tablet 1  ? Riboflavin 100 MG CAPS     ? rosuvastatin (CRESTOR) 10 MG tablet Take 1 tablet (10 mg total) by mouth daily. 90 tablet 3  ? TURMERIC PO Take by mouth.    ? UBIQUINOL PO Take 100 mg by mouth daily at 12 noon.    ? ?No facility-administered medications prior to visit.  ?  ? ?Per HPI unless specifically indicated in ROS section below ?Review of Systems  ?Constitutional:  Negative for fatigue and fever.  ?HENT:  Negative for congestion.   ?Eyes:  Negative for pain.  ?Respiratory:  Negative for cough and shortness of breath.   ?Cardiovascular:  Negative for chest pain, palpitations and leg swelling.  ?Gastrointestinal:  Negative for abdominal pain.  ?Genitourinary:  Negative  for dysuria and vaginal bleeding.  ?Musculoskeletal:  Negative for back pain.  ?Neurological:  Negative for syncope, light-headedness and headaches.  ?Psychiatric/Behavioral:  Negative for dysphoric mood.   ?Objective:  ?BP 106/70   Pulse 62   Temp (!) 97.5 ?F (36.4 ?C) (Oral)   Ht '5\' 6"'$  (1.676 m)   Wt 275 lb 1 oz (124.8 kg)   SpO2 96%   BMI 44.40 kg/m?   ?Wt Readings from Last 3 Encounters:  ?10/10/21 275 lb 1 oz (124.8 kg)  ?09/21/21 276 lb 8 oz (125.4 kg)  ?09/05/21 270 lb 9 oz (122.7 kg)  ?  ?  ?Physical Exam ?Constitutional:   ?   General: She is not in acute distress. ?   Appearance: Normal appearance. She is well-developed. She is obese. She is not ill-appearing or toxic-appearing.  ?HENT:  ?   Head: Normocephalic.  ?   Right Ear: Hearing, tympanic membrane, ear canal and external ear normal. Tympanic membrane is not erythematous, retracted or bulging.  ?   Left Ear: Hearing, tympanic membrane, ear canal and external ear normal. Tympanic membrane is not erythematous, retracted or bulging.  ?   Nose: No mucosal edema or rhinorrhea.  ?   Right Sinus: No maxillary sinus tenderness or frontal sinus tenderness.  ?   Left Sinus: No maxillary sinus tenderness or frontal sinus tenderness.  ?   Mouth/Throat:  ?   Pharynx: Uvula midline.  ?Eyes:  ?   General: Lids are normal. Lids are everted, no foreign bodies appreciated.  ?   Conjunctiva/sclera: Conjunctivae normal.  ?   Pupils: Pupils are equal, round, and reactive to light.  ?Neck:  ?   Thyroid: No thyroid mass or thyromegaly.  ?   Vascular: No carotid bruit.  ?   Trachea: Trachea normal.  ?Cardiovascular:  ?   Rate and Rhythm: Normal rate and regular rhythm.  ?   Pulses: Normal pulses.  ?   Heart sounds: Normal heart sounds, S1 normal and S2 normal. No murmur heard. ?  No friction rub. No gallop.  ?Pulmonary:  ?   Effort: Pulmonary effort is normal. No tachypnea or respiratory distress.  ?   Breath sounds: Normal breath sounds. No decreased breath sounds,  wheezing, rhonchi or rales.  ?Chest:  ?   Chest wall: Tenderness present. No mass or edema.  ?Breasts: ?   Breasts are symmetrical.  ?   Right: Skin change present. No mass.  ?   Left: Normal.  ?   Comments: Right breast with scar at 4 oclock.. mild scar tissue, no mass/ no redness,  breast tissue is not tender to palpation.. pain only comes when pressing down to chest wall,  right pectoralis and right costrochondral junction ?Abdominal:  ?   General: Bowel sounds are normal.  ?   Palpations: Abdomen is soft.  ?   Tenderness: There is no abdominal tenderness.  ?Musculoskeletal:  ?   Cervical back: Normal range of motion and neck supple.  ?Skin: ?   General: Skin is warm and dry.  ?   Findings: No rash.  ?Neurological:  ?   Mental Status: She is alert.  ?Psychiatric:     ?   Mood and Affect: Mood is not anxious or depressed.     ?   Speech: Speech normal.     ?   Behavior: Behavior normal. Behavior is cooperative.     ?   Thought Content: Thought content normal.     ?   Judgment: Judgment normal.  ? ?   ?Results for orders placed or performed in visit on 09/28/21  ?ECHOCARDIOGRAM COMPLETE  ?Result Value Ref Range  ? AR max vel 3.12 cm2  ? AV Peak grad 10.5 mmHg  ? Ao pk vel 1.62 m/s  ? S' Lateral 2.70 cm  ? Area-P 1/2 2.88 cm2  ? AV Area VTI 3.42 cm2  ? AV Mean grad 5.0 mmHg  ? Single Plane A4C EF 55.5 %  ? AV Area mean vel 2.82 cm2  ? ? ?This visit occurred during the SARS-CoV-2 public health emergency.  Safety protocols were in place, including screening questions prior to the visit, additional usage of staff PPE, and extensive cleaning of exam room while observing appropriate contact time as indicated for disinfecting solutions.  ? ?COVID 19 screen:  No recent travel or known exposure to [place] ?The patient denies respiratory symptoms of COVID 19 at this time. ?The importance of social distancing was discussed today.  ? ?Assessment and Plan ? ?  ?Problem List Items Addressed This Visit   ? ? Age-related  osteoporosis without current pathological fracture  ?  Chronic, worsening ? reviewed prolia in detail with patient and will begin process for initiation. ? ?  ?  ? Breast pain, right - Primary  ?   No clear breast abnormality. ?

## 2021-10-10 NOTE — Patient Instructions (Signed)
We will move forward with  setting up prolia. ? Start heat and gentle stretching of right chest wall. Can use tylenol for pain as needed. ? ?  ?

## 2021-10-10 NOTE — Assessment & Plan Note (Signed)
No clear association with pain and recent surgery. No sign of seroma, asbcess. ?

## 2021-10-10 NOTE — Progress Notes (Signed)
Not on current medication list.  Okay to refill? 

## 2021-10-11 NOTE — Progress Notes (Signed)
? ?Cardiology Office Note   ? ?Date:  10/16/2021  ? ?ID:  Kimberly Patton, DOB December 11, 1955, MRN [id number] ? ?PCP:  Jinny Sanders, MD  ?Cardiologist:  [name], MD  ?Electrophysiologist:  None  ? ?Chief Complaint: Follow-up ? ?History of Present Illness:  ? ?Kimberly Patton is a 67 y.o. female with history of nonobstructive CAD by coronary CTA, paroxysmal SVT, diastolic dysfunction, HTN, iron deficiency anemia, hiatal hernia, thyroid nodule status post recent biopsy, moderate asthma, hiatal hernia status post mesh surgical repair, severe (by her report) GERD, and obesity status post gastric sleeve who presents for follow up of coronary CTA and echo. ?  ?Remote echo in 2013 showed an EF of 55 to 65%, normal wall motion, grade 1 diastolic dysfunction, mild mitral regurgitation, and normal RV systolic function.  Nuclear stress test at that time showed no evidence of ischemia.  Most recent echo from 01/2020 showed an EF of 60 to 65%, no regional wall motion abnormalities, grade 1 diastolic dysfunction, normal RV systolic function and ventricular cavity size, and mildly elevated PASP estimated at 40.5 mmHg.  Nuclear stress test at that time showed no evidence of ischemia with a normal LVSF and was overall low risk.  No significant aortic atherosclerosis or coronary artery calcification was identified.  Outpatient cardiac monitoring in 03/2020 showed a predominant rhythm of sinus with an average rate of 71 bpm (range 43 to 193 bpm), 1 run of NSVT lasting 4 beats, occasional episodes of SVT were identified and associated with patient triggered events.  PFTs in 01/2021 consistent with moderate asthma. ?  ?She was seen in the office in 07/2021 and was doing well without symptoms of angina or decompensation.  At that time, it was noted she had been under increased stress, having been diagnosed with a breast mass that was found to be noncancerous, colonoscopy showing multiple polyps that were resected and did not reveal  cancer, and "throat issues" having been diagnosed with a goiter/thyroid nodule that increased in size.  No changes were indicated and annual follow-up was recommended. ?  ?She recently underwent a biopsy of her thyroid for nodules on 09/05/2021.  Following this, she contacted her surgeon's office noting issues with swallowing.  She contacted our office this morning noting intermittent chest tightness and shortness of breath for [redacted] week along with some right-sided chest discomfort, DOE, and dysphagia. High sensitivity troponin was normal.  CXR was nonacute.  Coronary CTA showed a calcium score of 59.8 which was the 76 percentile with nonobstructive CAD involving the proximal LAD, RCA, and LCx arteries.  Echo on 09/28/2021 demonstrated an EF of 55 to 60%, no regional wall motion abnormalities, normal LV diastolic function parameters, normal RV systolic function and ventricular cavity size, and trivial mitral regurgitation. ? ?She comes in doing well from a cardiac perspective and is without symptoms of angina or decompensation.  She does continue to note shortness of breath and voice hoarseness.  CT of the neck, as recommended by endocrinology, remains pending at this time.  She has been evaluated by pulmonary and is awaiting a new inhaler.  PFTs pending.  Weight stable. ? ? ?Labs independently reviewed: ?09/2021 - Hgb 12.2, PLT 292 potassium 4.1, BUN 19, serum creatinine 0.5 ?08/2021 - TSH normal ?07/2021 - TC 168, TG 88, HDL 67, LDL 85 ?10/2020 - albumin 4.0, AST/ALT normal ?08/2020 - A1c 5.1 ? ?Past Medical History:  ?Diagnosis Date  ? ALLERGIC RHINITIS 04/03/2007  ? Allergy   ? Not  sure  ? Anemia   ? ((Pt Qnr Sub: Denies at visit from 08/28/2021 with endocrinology))   ? Anxiety   ? Arthritis Not sure  ? Asthma   ? hx of years ago - no longer a problem   ? ASTHMA, PERSISTENT, MODERATE 04/03/2007  ? Cancer [facility])   ? skin   ? Dyspnea on exertion   ? a. 11/2007 Echo: EF 60%.  ? Dysrhythmia   ? hx of heart arrhythmia 10-15 years  ago - followed by DR Lovena Le - not seen in years   ? GERD (gastroesophageal reflux disease)   ? 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 Hypertension   ? Joint pain   ? HIPS / LEGS  ? Knee injury   ? RT  ? Meningioma ([place])   ? Morbid obesity ([place])   ? Neuromuscular disorder [facility]) Not sure, since my fall  ? Paroxysmal SVT (supraventricular tachycardia) ([facility])   ? a. 11/2011 48h Holter: RSR, rare PVC's, occas PAC's  ? Retinal tear of right eye 01/2016  ? Ventral hernia   ? ? ?Past Surgical History:  ?Procedure Laterality Date  ? ABDOMINAL HYSTERECTOMY  Think 1994  ? APPENDECTOMY  1996  ? BIOPSY THYROID    ? 08/2021  ? BREAST CYST EXCISION Right 1994  ? Benign  ? CESAREAN SECTION    ? x2  ? CHOLECYSTECTOMY  1995  ? COLONOSCOPY WITH PROPOFOL N/A 05/09/2021  ? Procedure: COLONOSCOPY WITH PROPOFOL;  Surgeon: [name], MD;  Location[facility] ENDOSCOPY;  Service: Gastroenterology;  Laterality: N/A;  ? EYE SURGERY  01/2016  ? Repair retinal tear  ? FOOT SURGERY  1995 / 1996  ? x2  ? HERNIA REPAIR    ? KNEE ARTHROSCOPY W/ MENISCAL REPAIR  07/25/2013  ? right knee Dr. Mardelle Matte  ? LAPAROSCOPIC GASTRIC SLEEVE RESECTION N/A 06/08/2013  ? Procedure: LAPAROSCOPIC GASTRIC SLEEVE RESECTION AND EXCISION OF SEBACEUS CYST FROM MID CHEST takedown of incarcerated ventral hernia and primary repair endoscopy;  Surgeon: [name], MD;  Location: [facility] ORS;  Service: General;  Laterality: N/A;  ? LAPAROSCOPIC NISSEN FUNDOPLICATION N/A [id number]/[id number]  ? Procedure: LAPAROSCOPIC REPAIR LARGE SYMPTOMATIC HIATAL HERNIA WITH UPPER ENDOSCOPY;  Surgeon: Johnathan Hausen, MD;  Location: [facility] ORS;  Service: General;  Laterality: N/A;  ? moles  06/2013  ? removed 2 moles from under arm and  lowback  ? NECK SURGERY    ? occipital nerve damage- injections   ? OVARIAN CYST REMOVAL  1970  ? PILONIDAL CYST EXCISION  1975  ? RADIOACTIVE SEED GUIDED EXCISIONAL BREAST BIOPSY Right 06/23/2021  ? Procedure: RADIOACTIVE SEED GUIDED EXCISIONAL RIGHT BREAST BIOPSY;  Surgeon: Johnathan Hausen,  MD;  Location: [place];  Service: General;  Laterality: Right;  ? RETINAL TEAR REPAIR CRYOTHERAPY Right 01/2016  ? [place]  ? SKIN CANCER EXCISION    ? TONSILLECTOMY    ? TUBAL LIGATION  Think 1994  ? UPPER GI ENDOSCOPY  06/08/2013  ? Procedure: UPPER GI ENDOSCOPY;  Surgeon: [name], MD;  Location: [facility] ORS;  Service: General;;  ? US ECHOCARDIOGRAPHY  12/2011  ? WNL - EF 55-60%, mild MR, grade 1 diastolic dysnfiction (mild)  ? VENTRAL HERNIA REPAIR  2000  ? ? ?Current Medications: ?No outpatient medications have been marked as taking for the 10/16/21 encounter (Office Visit) with Rise Mu, PA-C.  ? ? ?Allergies:   Wasp venom, Chlorhexidine gluconate, Metronidazole, Prednisone, and Sodium hypochlorite  ? ?Social History  ? ?Socioeconomic  History  ? Marital status: Divorced  ?  Spouse name: Not on file  ? Number of children: 2  ? Years of education: 64  ? Highest education level: Not on file  ?Occupational History  ? Occupation: Pharmacist, hospital  ?  Employer: [employer]  ?Tobacco Use  ? Smoking status: Never  ? Smokeless tobacco: Never  ? Tobacco comments:  ?  smoke at age [age]  ?Vaping Use  ? Vaping Use: Never used  ?Substance and Sexual Activity  ? Alcohol use: Not Currently  ? Drug use: No  ? Sexual activity: Not Currently  ?  Partners: Male  ?  Birth control/protection: Abstinence, Pill  ?Other Topics Concern  ? Not on file  ?Social History Narrative  ? Lives at home alone.  ? Right-handed.  ? Occasional caffeine use.  ? ?Social Determinants of Health  ? ?Financial Resource Strain: Not on file  ?Food Insecurity: Not on file  ?Transportation Needs: Not on file  ?Physical Activity: Not on file  ?Stress: Not on file  ?Social Connections: Not on file  ?  ? ?Family History:  ?The patient's family history includes Breast cancer in her mother; Diabetes in her father, mother, and another family member; Hemophilia in an other family member; Hypertension in her father, mother, and another  family member; Kidney failure in her mother; Stroke in an other family member. ? ?ROS:   ?12 point review of systems is negative unless otherwise noted in the HPI. ? ? ?EKGs/Labs/Other Studies Reviewed:

## 2021-10-12 ENCOUNTER — Telehealth: Payer: Self-pay | Admitting: Pulmonary Disease

## 2021-10-12 ENCOUNTER — Encounter: Payer: Self-pay | Admitting: Pulmonary Disease

## 2021-10-12 ENCOUNTER — Ambulatory Visit (INDEPENDENT_AMBULATORY_CARE_PROVIDER_SITE_OTHER): Payer: Medicare Other | Admitting: Pulmonary Disease

## 2021-10-12 VITALS — BP 136/86 | HR 74 | Temp 97.8°F | Ht 66.0 in | Wt 275.0 lb

## 2021-10-12 DIAGNOSIS — R0609 Other forms of dyspnea: Secondary | ICD-10-CM | POA: Diagnosis not present

## 2021-10-12 MED ORDER — BUDESONIDE-FORMOTEROL FUMARATE 160-4.5 MCG/ACT IN AERO: 2.0000 | INHALATION_SPRAY | Freq: Two times a day (BID) | RESPIRATORY_TRACT | 5 refills | Status: DC

## 2021-10-12 MED ORDER — MONTELUKAST SODIUM 10 MG PO TABS: 10.0000 mg | ORAL_TABLET | Freq: Every day | ORAL | 5 refills | Status: DC

## 2021-10-12 MED ORDER — AZELASTINE HCL 0.15 % NA SOLN: 1.0000 | Freq: Two times a day (BID) | NASAL | 3 refills | Status: DC

## 2021-10-12 NOTE — Progress Notes (Signed)
Chief Complaint  ?Patient presents with  ? Acute Visit  ?  SOB, prod cough and wheezing x2. Sx started after thyroid bx.   ? ? ?Summary: ?Kimberly Patton is a 66 y.o. female former smoker followed by Dr. Patsey Berthold for asthma. ? ?History: ?She has thyroid nodule and followed by Dr. Cruzita Lederer with endocrinology.  She had biopsy done on 09/05/21.  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  She was seen by Dr. Armandina Gemma with general surgery for possible Lt thyroid lobectomy.   ? ?She has gained a fair amount of weight during the pandemic.  She has noticed feeling more short of breath since she had her thyroid biopsy.  She hasn't noticed any neck swelling.  She has sinus congestion and post nasal drip.  Gets occasional wheezing.  Has cough with clear sputum.  No fever, skin rash, or leg swelling.  She was having chest discomfort.  She was cardiology and evaluation was unremarkable. ? ?Chest xray from 09/21/21 showed borderline cardiomegaly.  Cardiac CT from 09/21/21 showed coronary calcium score of 59.8, otherwise unremarkable.  Echo from 09/28/21 was normal. ? 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 ? ?Vital signs: ?BP 136/86 (BP Location: Left Arm, Cuff Size: Large)   Pulse 74   Temp 97.8 ?F (36.6 ?C) (Temporal)   Ht '5\' 6"'$  (1.676 m)   Wt 275 lb (124.7 kg)   SpO2 99%   BMI 44.39 kg/m?  ? ?Physical exam: ? ?Appearance - well kempt  ? ?ENMT - no sinus tenderness, no oral exudate, no LAN, Mallampati 3 airway, no stridor ? ?Respiratory  - equal breath sounds bilaterally, no wheezing or rales ? ?CV - s1s2 regular rate and rhythm, no murmurs ? ?Ext - no clubbing, no edema ? ?Skin - no rashes ? ?Psych - normal mood and affect ? ?Studies: ?CT chest 01/12/21 >> atherosclerosis, coronary calcification, 3.4 cm Lt thyroid nodule, lungs clear ?PFT 01/17/21 >> FEV1 1.95 (74%), FEV1% 77, TLC 6.73 (125%), DLCO 62% ?Echo 09/28/21 >> EF 55 to 60% ? ?Labs: ? ? ?  Latest Ref Rng & Units 09/21/2021  ? 12:22 PM 02/24/2021  ?  7:19 AM 12/27/2020  ?  7:59 AM  ?BMP  ?Glucose 70 - 99 mg/dL 99   96   87    ?BUN 8 - 23 mg/dL '19   22   21    '$ ?Creatinine 0.44 - 1.00 mg/dL 0.50   0.46   0.55    ?Sodium 135 - 145 mmol/L 139   140   140    ?Potassium 3.5 - 5.1 mmol/L 4.1   4.2   3.5    ?Chloride 98 - 111 mmol/L 102   103   101    ?CO2 22 - 32 mmol/L '29   29   30    '$ ?Calcium 8.9 - 10.3 mg/dL 9.5   9.5   9.5    ? ? ? ? ?  Latest Ref Rng & Units 09/21/2021  ? 12:22 PM 10/17/2020  ? 12:53 PM 08/23/2020  ?  8:01 AM  ?CBC  ?WBC 4.0 - 10.5 K/uL 7.4  6.3   5.0    ?Hemoglobin 12.0 - 15.0 g/dL 12.2   13.1   12.8    ?Hematocrit 36.0 - 46.0 % 38.9   40.9   38.9    ?Platelets 150 - 400 K/uL 292   293   279.0    ? ? ?Assessment/plan: ? ?Dyspnea on exertion associated with globus sensation. ?- seem more likely related to progression of asthma and allergic rhinitis ?- will have her try symbicort in place of qvar ?- continue singulair at night ?- add flonase and astepro ?- will arrange for repeat PFT ?- if her dyspnea persists, then she might need further neck imaging to determine if her thyroid nodule could be contributing to her dyspnea but these seems less likely ?- continue protonix for history of reflux ? ? ?Patient Instructions  ?Stop Qvar ? ?Flonase 1 spray in each nostril nightly ? ?Astepro 1 spray in each nostril twice per day ? ?Symbicort two puffs in the morning and two puffs in the evening, and rinse your mouth after each use ? ?Montelukast (singulair) 10 mg pill nightly ? ?Will arrange for  pulmonary function test ? ?Follow up with Dr.Gonzalez or Nurse Practitioner in 4 weeks ? ?Allergies as of 10/12/2021   ? ?   Reactions  ? Wasp Venom Anaphylaxis  ? Chlorhexidine Gluconate   ? Itching under breasts  ? Metronidazole Other (See Comments)  ? Chest tightness, neck tightness  ? Prednisone Other (See Comments)  ? Severe migraine / with taper dose  ? Sodium Hypochlorite Rash  ? Liquid clorox bleach  ? ?  ? ?  ?Medication List  ?  ? ?  ? Accurate as of Oct 12, 2021 11:58 AM. If you have any questions, ask your nurse or doctor.  ?  ?  ? ?  ? ?STOP taking these medications   ? ?Qvar RediHaler 40 MCG/ACT inhaler ?Generic drug: beclomethasone ?Stopped by: Chesley Mires, MD ?  ? ?  ? ?TAKE these medications   ? ?albuterol 108 (90 Base) MCG/ACT inhaler ?Commonly known as: VENTOLIN HFA ?Inhale 2 puffs into the lungs every 6 (six) hours as needed for wheezing or shortness of breath. ?  ?augmented betamethasone dipropionate 0.05 % ointment ?Commonly known as: DIPROLENE-AF ?Apply topically. ?  ?Azelastine HCl 0.15 % Soln ?Commonly known as: Astepro ?Place 1 spray into the nose 2 (two) times daily. ?Started by: Chesley Mires, MD ?  ?budesonide-formoterol 160-4.5 MCG/ACT inhaler ?Commonly known as: Symbicort ?Inhale 2 puffs into the lungs 2 (two) times daily. ?Started by: Chesley Mires, MD ?  ?CENTRUM SILVER 50+WOMEN PO ?Take by mouth. ?  ?CITRACAL + D PO ?Take 2 tablets by mouth daily. ?  ?CULTURELLE IMMUNITY SUPPORT PO ?Take by mouth. ?  ?diltiazem 120 MG 12 hr capsule ?Commonly known as: CARDIZEM SR ?Take 1 capsule (120 mg total) by mouth 2 (two) times daily. ?  ?ELDERBERRY PO ?Take 1,000 mg by mouth daily. ?  ?Fish Oil 1200 MG Caps ?Take by mouth. ?  ?fluticasone 50 MCG/ACT nasal spray ?Commonly known as: FLONASE ?SPRAY 2 SPRAYS INTO EACH NOSTRIL EVERY DAY ?  ?gabapentin 300 MG capsule ?Commonly known as: NEURONTIN ?Take 300 mg by mouth 2 (two) times daily. ?  ?Magnesium 500 MG Tabs ?  ?montelukast 10 MG tablet ?Commonly  known as: SINGULAIR ?Take 1 tablet (10 mg total) by mouth at bedtime. ?Started by: Chesley Mires, MD ?  ?nitroGLYCERIN 0.4 MG SL tablet ?Commonly known as: NITROSTAT ?Place 0.4 mg under  the tongue every 5 (five) minutes as needed for chest pain. ?  ?pantoprazole 40 MG tablet ?Commonly known as: PROTONIX ?Take 1 tablet (40 mg total) by mouth daily. ?  ?Riboflavin 100 MG Caps ?  ?rosuvastatin 10 MG tablet ?Commonly known as: CRESTOR ?Take 1 tablet (10 mg total) by mouth daily. ?  ?TURMERIC PO ?Take by mouth. ?  ?UBIQUINOL PO ?Take 100 mg by mouth daily at 12 noon. ?  ?Vitamin B-12 5000 MCG Subl ?Place 5,000 mcg under the tongue daily. ?  ?Vitamin D3 1.25 MG (50000 UT) Caps ?Take 1 capsule by mouth once a week. ?  ? ?  ? ? ?Time spent: ?45 minutes ? ?Signature: ?Chesley Mires, MD ?[place] ?Pager - [phone] - [phone] ?10/12/2021, 12:43 PM ? ?

## 2021-10-12 NOTE — Patient Instructions (Signed)
Stop Qvar ? ?Flonase 1 spray in each nostril nightly ? ?Astepro 1 spray in each nostril twice per day ? ?Symbicort two puffs in the morning and two puffs in the evening, and rinse your mouth after each use ? ?Montelukast (singulair) 10 mg pill nightly ? ?Will arrange for pulmonary function test ? ?Follow up with Dr.Gonzalez or Nurse Practitioner in 4 weeks ?

## 2021-10-12 NOTE — Assessment & Plan Note (Signed)
Chronic, worsening ? ?Reviewed previous bone densities in detail.  Reviewed recent endocrine office visits in detail. ? ?Encouraged patient to continue working on weight bearing exercise, calcium and vitamin D supplementation. ?She has contraindication for oral bisphosphonate given she has severe reflux, a moderate hiatal hernia and past bariatric surgery.  We reviewed options in detail.  Prolia seems to be the best option for her and she has no current contraindications.  She will look into insurance coverage and will consider this.  She will let me know if she is interested in proceeding.  Next bone density will be planned in 2 years. ? ? ?

## 2021-10-12 NOTE — Telephone Encounter (Signed)
Spoke to patient. ? C/o increased SOB, wheezing and occ dry cough x2w. ?Denied f/c/s or additional sx. ?Using albuterol every other day and Qvar once daily.  ?Acute visit scheduled today at 12:00 with Dr. Halford Chessman.  ?Nothing further needed.  ? ?

## 2021-10-13 NOTE — Telephone Encounter (Signed)
I saw Dr Halford Chessman today........I was wondering, Dr Diona Browner has me taking 50000 of vitamin D3 once a week. This is my third week. Could this cause my dry mouth and shortness of breath? ?  ?I just thought it seems to match the time line of my symptoms... ? ? ? ?Dr. Halford Chessman, please advise. Thanks ?

## 2021-10-14 ENCOUNTER — Other Ambulatory Visit: Payer: Self-pay | Admitting: Family Medicine

## 2021-10-16 ENCOUNTER — Ambulatory Visit (INDEPENDENT_AMBULATORY_CARE_PROVIDER_SITE_OTHER): Payer: Medicare Other | Admitting: Physician Assistant

## 2021-10-16 ENCOUNTER — Encounter: Payer: Self-pay | Admitting: Physician Assistant

## 2021-10-16 ENCOUNTER — Other Ambulatory Visit: Payer: Self-pay

## 2021-10-16 VITALS — BP 130/82 | HR 68 | Ht 66.0 in | Wt 276.0 lb

## 2021-10-16 DIAGNOSIS — I10 Essential (primary) hypertension: Secondary | ICD-10-CM | POA: Diagnosis not present

## 2021-10-16 DIAGNOSIS — E782 Mixed hyperlipidemia: Secondary | ICD-10-CM | POA: Diagnosis not present

## 2021-10-16 DIAGNOSIS — R0602 Shortness of breath: Secondary | ICD-10-CM

## 2021-10-16 DIAGNOSIS — R0609 Other forms of dyspnea: Secondary | ICD-10-CM | POA: Diagnosis not present

## 2021-10-16 DIAGNOSIS — I5189 Other ill-defined heart diseases: Secondary | ICD-10-CM

## 2021-10-16 NOTE — Patient Instructions (Addendum)
Medication Instructions:  ?Your physician recommends that you continue on your current medications as directed. Please refer to the Current Medication list given to you today. ? ?*If you need a refill on your cardiac medications before your next appointment, please call your pharmacy* ? ? ?Lab Work: ?No labs ordered ? ?If you have labs (blood work) drawn today and your tests are completely normal, you will receive your results only by: ?MyChart Message (if you have MyChart) OR ?A paper copy in the mail ?If you have any lab test that is abnormal or we need to change your treatment, we will call you to review the results. ? ? ?Testing/Procedures: ?No labs ordered ? ?Follow-Up: ?At [facility], you and your health needs are our priority.  As part of our continuing mission to provide you with exceptional heart care, we have created designated Provider Care Teams.  These Care Teams include your primary Cardiologist (physician) and Advanced Practice Providers (APPs -  Physician Assistants and Nurse Practitioners) who all work together to provide you with the care you need, when you need it. ? ?We recommend signing up for the patient portal called "MyChart".  Sign up information is provided on this After Visit Summary.  MyChart is used to connect with patients for Virtual Visits (Telemedicine).  Patients are able to view lab/test results, encounter notes, upcoming appointments, etc.  Non-urgent messages can be sent to your provider as well.   ?To learn more about what you can do with MyChart, go to [email or web address].   ? ?Your next appointment:   ?6 month(s) ? ?The format for your next appointment:   ?In Person ? ?Provider:   ?You may see [name], MD or one of the following Advanced Practice Providers on your designated Care Team:   ?Murray Hodgkins, NP ?Christell Faith, PA-C ?Cadence Kathlen Mody, PA-C{ ? ?Important Information About Sugar ? ? ? ? ? ? ?

## 2021-10-18 ENCOUNTER — Telehealth: Payer: Self-pay | Admitting: Pulmonary Disease

## 2021-10-18 MED ORDER — AZELASTINE HCL 0.1 % NA SOLN: 1.0000 | Freq: Two times a day (BID) | NASAL | 12 refills | Status: DC

## 2021-10-18 NOTE — Telephone Encounter (Signed)
Okay to change azelastine to 0.1% one spray bid. ?

## 2021-10-18 NOTE — Telephone Encounter (Signed)
Patient is aware of medication change and voiced her understanding.  ?Azelastine 0.1% sent to preferred pharmacy.  ?Nothing further needed.  ? ?

## 2021-10-18 NOTE — Telephone Encounter (Signed)
Spoke to [employer] with [employer].  ?She stated that Azelastine 0.15% is not in stock. [facility] has Azelastine 0.1% in stock. ? ?Dr. Halford Chessman, please advise if okay to change strength. ?

## 2021-10-20 ENCOUNTER — Other Ambulatory Visit: Payer: Self-pay | Admitting: Internal Medicine

## 2021-10-20 ENCOUNTER — Telehealth: Payer: Self-pay

## 2021-10-20 DIAGNOSIS — E042 Nontoxic multinodular goiter: Secondary | ICD-10-CM

## 2021-10-20 NOTE — Telephone Encounter (Signed)
OK, I did.  Please tell them that if they need to change the order, I am perfectly fine with them routing it to me so that I can sign the order. ?

## 2021-10-20 NOTE — Telephone Encounter (Signed)
[facility] called requested a new order for pt's CT to be placed with contrast. ?

## 2021-10-20 NOTE — Telephone Encounter (Signed)
Pt has new insurance that requires pre-cert for CT scan scheduled 10/23/21. Follow up requested at [phone] ext [phone] ?

## 2021-10-23 ENCOUNTER — Ambulatory Visit: Admission: RE | Admit: 2021-10-23 | Payer: Medicare Other | Source: Ambulatory Visit

## 2021-10-23 ENCOUNTER — Ambulatory Visit: Payer: Medicare Other

## 2021-10-25 ENCOUNTER — Other Ambulatory Visit: Payer: Self-pay | Admitting: Family Medicine

## 2021-10-26 NOTE — Telephone Encounter (Signed)
Patient refused appointment.

## 2021-10-31 ENCOUNTER — Encounter: Payer: Self-pay | Admitting: Family Medicine

## 2021-10-31 DIAGNOSIS — Z9889 Other specified postprocedural states: Secondary | ICD-10-CM | POA: Diagnosis not present

## 2021-10-31 DIAGNOSIS — R0602 Shortness of breath: Secondary | ICD-10-CM | POA: Diagnosis not present

## 2021-10-31 DIAGNOSIS — K219 Gastro-esophageal reflux disease without esophagitis: Secondary | ICD-10-CM | POA: Diagnosis not present

## 2021-10-31 DIAGNOSIS — R0989 Other specified symptoms and signs involving the circulatory and respiratory systems: Secondary | ICD-10-CM | POA: Diagnosis not present

## 2021-10-31 NOTE — Telephone Encounter (Signed)
Dr Diona Browner see my reply to the patient, there is a long turn around time from [place] portal right now and Joycelyn Schmid, [place] rep, put an urgent reply request for me for the patient.

## 2021-10-31 NOTE — Telephone Encounter (Signed)
I do not see any injectables on her medication list.  Please advise.

## 2021-11-01 ENCOUNTER — Other Ambulatory Visit: Payer: Medicare Other

## 2021-11-07 ENCOUNTER — Telehealth: Payer: Self-pay

## 2021-11-07 NOTE — Telephone Encounter (Signed)
New start. Benefits submitted, OOP cost is $94 Patient advised. Patient had labs done on 09/21/21 and per Dr Diona Browner these labs are ok to be used. CrCl is 218.75 mL/min. Calcium 9.5 normal NV on 11/08/21 PA submitted via covermymeds. Key: [id number]

## 2021-11-07 NOTE — Telephone Encounter (Signed)
PA approved 11/07/2021 through 11/08/2022.

## 2021-11-08 ENCOUNTER — Ambulatory Visit (INDEPENDENT_AMBULATORY_CARE_PROVIDER_SITE_OTHER): Payer: Medicare Other

## 2021-11-08 DIAGNOSIS — M810 Age-related osteoporosis without current pathological fracture: Secondary | ICD-10-CM

## 2021-11-08 MED ORDER — DENOSUMAB 60 MG/ML ~~LOC~~ SOSY
60.0000 mg | PREFILLED_SYRINGE | Freq: Once | SUBCUTANEOUS | Status: AC
  Administered 2021-11-08: 60 mg via SUBCUTANEOUS

## 2021-11-08 NOTE — Progress Notes (Signed)
Per orders of Dr. Lorelei Pont, injection of Prolia given by Brenton Grills. Patient tolerated injection well.

## 2021-11-08 NOTE — Telephone Encounter (Signed)
Pt given Prolia inj today.  Fyi to Anastasiya. 

## 2021-11-12 ENCOUNTER — Encounter: Payer: Self-pay | Admitting: Family Medicine

## 2021-11-14 ENCOUNTER — Telehealth (INDEPENDENT_AMBULATORY_CARE_PROVIDER_SITE_OTHER): Payer: Self-pay

## 2021-11-14 NOTE — Telephone Encounter (Signed)
We can bring her in as schedule allows with no studies to see GS/FB. Also, she has an upcoming appointment on 07/3

## 2021-11-16 ENCOUNTER — Ambulatory Visit (INDEPENDENT_AMBULATORY_CARE_PROVIDER_SITE_OTHER): Payer: Medicare Other | Admitting: Vascular Surgery

## 2021-11-16 ENCOUNTER — Encounter (INDEPENDENT_AMBULATORY_CARE_PROVIDER_SITE_OTHER): Payer: Self-pay | Admitting: Vascular Surgery

## 2021-11-16 VITALS — BP 155/92 | HR 68 | Resp 68 | Ht 66.0 in | Wt 277.0 lb

## 2021-11-16 DIAGNOSIS — I872 Venous insufficiency (chronic) (peripheral): Secondary | ICD-10-CM | POA: Diagnosis not present

## 2021-11-16 DIAGNOSIS — I890 Lymphedema, not elsewhere classified: Secondary | ICD-10-CM

## 2021-11-16 DIAGNOSIS — I10 Essential (primary) hypertension: Secondary | ICD-10-CM | POA: Diagnosis not present

## 2021-11-16 DIAGNOSIS — I83893 Varicose veins of bilateral lower extremities with other complications: Secondary | ICD-10-CM

## 2021-11-16 DIAGNOSIS — I25118 Atherosclerotic heart disease of native coronary artery with other forms of angina pectoris: Secondary | ICD-10-CM

## 2021-11-18 ENCOUNTER — Encounter (INDEPENDENT_AMBULATORY_CARE_PROVIDER_SITE_OTHER): Payer: Self-pay | Admitting: Vascular Surgery

## 2021-11-18 NOTE — Progress Notes (Addendum)
MRN : [id number]  Kimberly Patton is a 66 y.o. (12-17-1955) female who presents with chief complaint of legs swell.  History of Present Illness:  The patient returns to the office for followup evaluation regarding leg swelling.  She has been using conservative treatment  for over 12 months, including compression, elevations, exercise and OTC pain medication.  The swelling has persisted and the pain associated with swelling continues and she feels it is worse.  This is in spite of  on going treatment of her venous insufficiency.  There have not been any interval development of a ulcerations or wounds.  Since the previous visit the patient has been wearing graduated compression stockings and has noted little if any improvement in the lymphedema. The patient has been using compression routinely morning until night.  The patient also states elevation during the day and exercise is being done too.    Current Meds  Medication Sig   albuterol (VENTOLIN HFA) 108 (90 Base) MCG/ACT inhaler Inhale 2 puffs into the lungs every 6 (six) hours as needed for wheezing or shortness of breath.   augmented betamethasone dipropionate (DIPROLENE-AF) 0.05 % ointment Apply 1 application. topically daily.   azelastine (ASTELIN) 0.1 % nasal spray Place 1 spray into both nostrils 2 (two) times daily. Use in each nostril as directed   budesonide-formoterol (SYMBICORT) 160-4.5 MCG/ACT inhaler Inhale 2 puffs into the lungs 2 (two) times daily.   Calcium Citrate-Vitamin D (CITRACAL + D PO) Take 2 tablets by mouth daily.    Cholecalciferol (VITAMIN D3) 1.25 MG (50000 UT) CAPS Take 1 capsule by mouth once a week.   Cyanocobalamin (VITAMIN B-12) 5000 MCG SUBL Place 5,000 mcg under the tongue daily.   denosumab (PROLIA) 60 MG/ML SOSY injection    diltiazem (CARDIZEM SR) 120 MG 12 hr capsule Take 1 capsule (120 mg total) by mouth 2 (two) times daily.   ELDERBERRY PO Take 1,000 mg by mouth daily.    fluticasone  (FLONASE) 50 MCG/ACT nasal spray SPRAY 2 SPRAYS INTO EACH NOSTRIL EVERY DAY   gabapentin (NEURONTIN) 300 MG capsule Take 300 mg by mouth 2 (two) times daily.   Lactobacillus Rhamnosus, GG, (CULTURELLE IMMUNITY SUPPORT PO) Take 1 capsule by mouth daily.   Magnesium 500 MG TABS Take 1 tablet by mouth daily.   montelukast (SINGULAIR) 10 MG tablet Take 1 tablet (10 mg total) by mouth at bedtime.   Multiple Vitamins-Minerals (CENTRUM SILVER 50+WOMEN PO) Take 1 tablet by mouth daily.   nitroGLYCERIN (NITROSTAT) 0.4 MG SL tablet Place 0.4 mg under the tongue every 5 (five) minutes as needed for chest pain.   Omega-3 Fatty Acids (FISH OIL) 1200 MG CAPS Take 1 capsule by mouth daily.   pantoprazole (PROTONIX) 40 MG tablet TAKE 1 TABLET BY MOUTH EVERY DAY   Riboflavin 100 MG CAPS Take 1 capsule by mouth daily.   rosuvastatin (CRESTOR) 10 MG tablet Take by mouth.   traMADol (ULTRAM) 50 MG tablet Take 50 mg by mouth 2 (two) times daily as needed.   TURMERIC PO Take 1 tablet by mouth daily.    Past Medical History:  Diagnosis Date   ALLERGIC RHINITIS 04/03/2007   Allergy    Not sure   Anemia    ((Pt Qnr Sub: Denies at visit from 08/28/2021 with endocrinology))    Anxiety    Arthritis Not sure   Asthma    hx of years ago - no longer a problem    ASTHMA, PERSISTENT,  MODERATE 04/03/2007   Cancer ([place])    skin    Dyspnea on exertion    a. 11/2007 Echo: EF 60%.   Dysrhythmia    hx of heart arrhythmia 10-15 years ago - followed by DR Lovena Le - not seen in years    GERD (gastroesophageal reflux disease)    Hemophilia carrier    Hypertension    Joint pain    HIPS / LEGS   Knee injury    RT   Meningioma ([place])    Morbid obesity ([place])    Neuromuscular disorder ([place]) Not sure, since my fall   Paroxysmal SVT (supraventricular tachycardia) ([place])    a. 11/2011 48h Holter: RSR, rare PVC's, occas PAC's   Retinal tear of right eye 01/2016   Ventral hernia     Past Surgical History:  Procedure  Laterality Date   [place]   BIOPSY THYROID     08/2021   BREAST CYST EXCISION Right 1994   Benign   CESAREAN SECTION     x2   CHOLECYSTECTOMY  1995   COLONOSCOPY WITH PROPOFOL N/A 05/09/2021   Procedure: COLONOSCOPY WITH PROPOFOL;  Surgeon: [name], MD;  Location: [facility] ENDOSCOPY;  Service: Gastroenterology;  Laterality: N/A;   EYE SURGERY  01/2016   Repair retinal tear   FOOT SURGERY  1995 / 1996   x2   HERNIA REPAIR     KNEE ARTHROSCOPY W/ MENISCAL REPAIR  07/25/2013   right knee Dr. Mardelle Matte   LAPAROSCOPIC GASTRIC SLEEVE RESECTION N/A 06/08/2013   Procedure: LAPAROSCOPIC GASTRIC SLEEVE RESECTION AND EXCISION OF SEBACEUS CYST FROM MID CHEST takedown of incarcerated ventral hernia and primary repair endoscopy;  Surgeon: [name], MD;  Location: [facility] ORS;  Service: General;  Laterality: N/A;   LAPAROSCOPIC NISSEN FUNDOPLICATION N/A 71/69/6789   Procedure: LAPAROSCOPIC REPAIR LARGE SYMPTOMATIC HIATAL HERNIA WITH UPPER ENDOSCOPY;  Surgeon: Johnathan Hausen, MD;  Location: [facility] ORS;  Service: General;  Laterality: N/A;   moles  06/2013   removed 2 moles from under arm and  lowback   NECK SURGERY     occipital nerve damage- injections    OVARIAN CYST [place] EXCISIONAL BREAST BIOPSY Right 06/23/2021   Procedure: RADIOACTIVE SEED GUIDED EXCISIONAL RIGHT BREAST BIOPSY;  Surgeon: Johnathan Hausen, MD;  Location: [facility];  Service: General;  Laterality: Right;   RETINAL TEAR REPAIR CRYOTHERAPY Right 01/2016   [place]   SKIN CANCER EXCISION     TONSILLECTOMY     TUBAL LIGATION  Think 1994   UPPER GI ENDOSCOPY  06/08/2013   Procedure: UPPER GI ENDOSCOPY;  Surgeon: [name], MD;  Location: [facility] ORS;  Service: General;;   US ECHOCARDIOGRAPHY  12/2011   WNL - EF 55-60%, mild MR, grade 1 diastolic dysnfiction (mild)   VENTRAL HERNIA REPAIR  2000     Social History Social History   Tobacco Use   Smoking status: Never   Smokeless tobacco: Never   Tobacco comments:    smoke at age 51-12  Vaping Use   Vaping Use: Never used  Substance Use Topics   Alcohol use: Not Currently   Drug use: No    Family History Family History  Problem Relation Age of Onset   Breast cancer Mother    Diabetes Mother    Hypertension Mother    Kidney failure Mother  Diabetes Father    Hypertension Father    Diabetes Other    Hypertension Other    Stroke Other    Hemophilia Other     Allergies  Allergen Reactions   Wasp Venom Anaphylaxis   Chlorhexidine Gluconate     Itching under breasts   Metronidazole Other (See Comments)    Chest tightness, neck tightness   Prednisone Other (See Comments)    Severe migraine / with taper dose   Sodium Hypochlorite Rash    Liquid clorox bleach     REVIEW OF SYSTEMS (Negative unless checked)  Constitutional: '[]'$ Weight loss  '[]'$ Fever  '[]'$ Chills Cardiac: '[]'$ Chest pain   '[]'$ Chest pressure   '[]'$ Palpitations   '[]'$ Shortness of breath when laying flat   '[]'$ Shortness of breath with exertion. Vascular:  '[]'$ Pain in legs with walking   '[x]'$ Pain in legs with standing  '[]'$ History of DVT   '[]'$ Phlebitis   '[x]'$ Swelling in legs   '[]'$ Varicose veins   '[]'$ Non-healing ulcers Pulmonary:   '[]'$ Uses home oxygen   '[]'$ Productive cough   '[]'$ Hemoptysis   '[]'$ Wheeze  '[]'$ COPD   '[]'$ Asthma Neurologic:  '[]'$ Dizziness   '[]'$ Seizures   '[]'$ History of stroke   '[]'$ History of TIA  '[]'$ Aphasia   '[]'$ Vissual changes   '[]'$ Weakness or numbness in arm   '[]'$ Weakness or numbness in leg Musculoskeletal:   '[]'$ Joint swelling   '[]'$ Joint pain   '[]'$ Low back pain Hematologic:  '[]'$ Easy bruising  '[]'$ Easy bleeding   '[]'$ Hypercoagulable state   '[]'$ Anemic Gastrointestinal:  '[]'$ Diarrhea   '[]'$ Vomiting  '[]'$ Gastroesophageal reflux/heartburn   '[]'$ Difficulty swallowing. Genitourinary:  '[]'$ Chronic kidney disease   '[]'$ Difficult urination  '[]'$ Frequent urination   '[]'$ Blood in urine Skin:  '[]'$ Rashes   '[]'$ Ulcers   Psychological:  '[]'$ History of anxiety   '[]'$  History of major depression.  Physical Examination  Vitals:   11/16/21 1523  BP: (!) 155/92  Pulse: 68  Resp: (!) 68  Weight: 277 lb (125.6 kg)  Height: '5\' 6"'$  (1.676 m)   Body mass index is 44.71 kg/m. Gen: WD/WN, NAD Head: [place]/AT, No temporalis wasting.  Ear/Nose/Throat: Hearing grossly intact, nares w/o erythema or drainage, pinna without lesions Eyes: PER, EOMI, sclera nonicteric.  Neck: Supple, no gross masses.  No JVD.  Pulmonary:  Good air movement, no audible wheezing, no use of accessory muscles.  Cardiac: RRR, precordium not hyperdynamic. Vascular:  large varicosities present bilaterally.  Mild venous stasis changes to the legs bilaterally.  3-4+ soft pitting edema  Vessel Right Left  Radial Palpable Palpable  Gastrointestinal: soft, non-distended. No guarding/no peritoneal signs.  Musculoskeletal: M/S 5/5 throughout.  No deformity.  Neurologic: CN 2-12 intact. Pain and light touch intact in extremities.  Symmetrical.  Speech is fluent. Motor exam as listed above. Psychiatric: Judgment intact, Mood & affect appropriate for pt's clinical situation. Dermatologic: Venous rashes no ulcers noted.  No changes consistent with cellulitis. Lymph : No lichenification or skin changes of chronic lymphedema.  CBC Lab Results  Component Value Date   WBC 7.4 09/21/2021   HGB 12.2 09/21/2021   HCT 38.9 09/21/2021   MCV 84.0 09/21/2021   PLT 292 09/21/2021    BMET    Component Value Date/Time   NA 139 09/21/2021 1222   NA 139 01/21/2020 1137   K 4.1 09/21/2021 1222   CL 102 09/21/2021 1222   CO2 29 09/21/2021 1222   GLUCOSE 99 09/21/2021 1222   BUN 19 09/21/2021 1222   BUN 16 01/21/2020 1137   CREATININE 0.50 09/21/2021 1222   CREATININE 0.57 01/01/2020 1623  CALCIUM 9.5 09/21/2021 1222   GFRNONAA >60 09/21/2021 1222   GFRAA >60 02/02/2020 1417   CrCl cannot be calculated (Patient's most recent lab result is older than the  maximum 21 days allowed.).  COAG Lab Results  Component Value Date   INR 1.0 12/03/2007   INR 1.0 11/25/2007    Radiology No results found.   Assessment/Plan 1. Lymphedema Recommend:  No surgery or intervention at this point in time.    I have reviewed my discussion with the patient regarding lymphedema and why it  causes symptoms.  Patient will continue wearing graduated compression on a daily basis. The patient should put the compression on first thing in the morning and removing them in the evening. The patient should not sleep in the compression.   In addition, behavioral modification throughout the day will be continued.  This will include frequent elevation (such as in a recliner), use of over the counter pain medications as needed and exercise such as walking.  The systemic causes for chronic edema such as liver, kidney and cardiac etiologies do not appear to have significant changed over the past year.    Despite conservative treatments that have been ongoing for 12 months including graduated compression therapy class 1 and behavioral modification including exercise and elevation the patient  has not obtained adequate control of the lymphedema.  The patient still has stage 3 lymphedema and therefore, I believe that a lymph pump should be added to improve the control of the patient's lymphedema.  Additionally, a lymph pump is warranted because it will reduce the risk of cellulitis and ulceration in the future.  Patient should follow-up in six months    2. Chronic venous insufficiency Recommend:  No surgery or intervention at this point in time.    I have reviewed my discussion with the patient regarding lymphedema and why it  causes symptoms.  Patient will continue wearing graduated compression on a daily basis. The patient should put the compression on first thing in the morning and removing them in the evening. The patient should not sleep in the compression.   In  addition, behavioral modification throughout the day will be continued.  This will include frequent elevation (such as in a recliner), use of over the counter pain medications as needed and exercise such as walking.  The systemic causes for chronic edema such as liver, kidney and cardiac etiologies do not appear to have significant changed over the past year.    Despite conservative treatments that have been ongoing for 12 months including graduated compression therapy class 1 and behavioral modification including exercise and elevation the patient  has not obtained adequate control of the lymphedema.  The patient still has stage 3 lymphedema and therefore, I believe that a lymph pump should be added to improve the control of the patient's lymphedema.  Additionally, a lymph pump is warranted because it will reduce the risk of cellulitis and ulceration in the future.  Patient should follow-up in six months    3. Varicose veins of bilateral lower extremities with other complications Recommend:  The patient has had successful ablation of the previously incompetent saphenous venous system but still has persistent symptoms of pain and swelling that are having a negative impact on daily life and daily activities.  Patient should undergo injection sclerotherapy to treat the residual varicosities.  The risks, benefits and alternative therapies were reviewed in detail with the patient.  All questions were answered.  The patient agrees to proceed with  sclerotherapy at their convenience.  The patient will continue wearing the graduated compression stockings and using the over-the-counter pain medications to treat her symptoms.       4. Primary hypertension Continue antihypertensive medications as already ordered, these medications have been reviewed and there are no changes at this time.   5. Coronary artery disease of native artery of native heart with stable angina pectoris ([place]) Continue cardiac and  antihypertensive medications as already ordered and reviewed, no changes at this time.  Continue statin as ordered and reviewed, no changes at this time  Nitrates PRN for chest pain     Hortencia Pilar, MD  11/18/2021 2:31 PM

## 2021-11-22 ENCOUNTER — Encounter: Payer: Self-pay | Admitting: Family Medicine

## 2021-11-28 ENCOUNTER — Telehealth: Payer: Self-pay | Admitting: Pulmonary Disease

## 2021-11-28 NOTE — Telephone Encounter (Signed)
I called the patient today asking to see if she would like to schedule the PFTs that Dr. Halford Chessman ordered for her. She states that with the new medications that Dr. Halford Chessman ordered she has been breathing much better and is wondering if PFTs are still needed

## 2021-11-28 NOTE — Telephone Encounter (Signed)
Dr. Halford Chessman, please see below message and advise. Thanks

## 2021-11-28 NOTE — Telephone Encounter (Signed)
I am okay to have her hold off on repeat PFT for now.  I saw her as an acute visit.  She is normally followed by Dr. Patsey Berthold.  Will include Dr. Patsey Berthold in this communication.

## 2021-11-29 NOTE — Telephone Encounter (Signed)
Spoke to patient and relayed below message. She would like to proceed with PFT.  PFT has been scheduled 11/30/2021.  Nothing further needed.

## 2021-11-30 ENCOUNTER — Ambulatory Visit: Payer: Medicare Other | Attending: Pulmonary Disease

## 2021-11-30 ENCOUNTER — Telehealth: Payer: Self-pay | Admitting: Pulmonary Disease

## 2021-11-30 ENCOUNTER — Encounter: Payer: Self-pay | Admitting: Pulmonary Disease

## 2021-11-30 DIAGNOSIS — R059 Cough, unspecified: Secondary | ICD-10-CM | POA: Diagnosis not present

## 2021-11-30 DIAGNOSIS — R0609 Other forms of dyspnea: Secondary | ICD-10-CM | POA: Insufficient documentation

## 2021-11-30 LAB — PULMONARY FUNCTION TEST ARMC ONLY
DL/VA % pred: 125 %
DL/VA: 5.16 ml/min/mmHg/L
DLCO unc % pred: 124 %
DLCO unc: 26.28 ml/min/mmHg
FEF 25-75 Post: 2.03 L/sec
FEF 25-75 Pre: 1.44 L/sec
FEF2575-%Change-Post: 40 %
FEF2575-%Pred-Post: 92 %
FEF2575-%Pred-Pre: 65 %
FEV1-%Change-Post: 7 %
FEV1-%Pred-Post: 77 %
FEV1-%Pred-Pre: 72 %
FEV1-Post: 1.99 L
FEV1-Pre: 1.86 L
FEV1FVC-%Change-Post: 5 %
FEV1FVC-%Pred-Pre: 99 %
FEV6-%Change-Post: 1 %
FEV6-%Pred-Post: 76 %
FEV6-%Pred-Pre: 75 %
FEV6-Post: 2.48 L
FEV6-Pre: 2.44 L
FEV6FVC-%Pred-Post: 104 %
FEV6FVC-%Pred-Pre: 104 %
FVC-%Change-Post: 1 %
FVC-%Pred-Post: 73 %
FVC-%Pred-Pre: 72 %
FVC-Post: 2.48 L
FVC-Pre: 2.44 L
Post FEV1/FVC ratio: 80 %
Post FEV6/FVC ratio: 100 %
Pre FEV1/FVC ratio: 76 %
Pre FEV6/FVC Ratio: 100 %
RV % pred: 122 %
RV: 2.7 L
TLC % pred: 104 %
TLC: 5.6 L

## 2021-11-30 MED ORDER — ALBUTEROL SULFATE (2.5 MG/3ML) 0.083% IN NEBU
2.5000 mg | INHALATION_SOLUTION | Freq: Once | RESPIRATORY_TRACT | Status: AC
  Administered 2021-11-30: 2.5 mg via RESPIRATORY_TRACT
  Filled 2021-11-30: qty 3

## 2021-12-01 NOTE — Telephone Encounter (Signed)
Called and spoke with patient, she states she received her first Prolia injection and was told not to take steroids while on it.  She is asking if it is ok to take her Symbicort.  Advised it is generally talking about oral steroids (medrol/prednisone), however, I would send to Dr. Jayme Cloud and once we hear back from her we will call her back and let her know.  She verbalized understanding.  Dr. Jayme Cloud, Patient received her first Prolia injection and was told not to take steroids.  She is asking if it is ok to [place] her Symbicort inhaler.  Please advise.  Thank you.

## 2021-12-01 NOTE — Telephone Encounter (Signed)
ATC x1.  Left detailed vm (DPR) that it is fine for her to use the Symbicort inhaler while on Prolia.  Advised to call with any further questions.

## 2021-12-01 NOTE — Telephone Encounter (Signed)
The amount of steroid in the inhaler is not absorbed significantly in the body as it is deposited in the lungs.No need to stop the inhaler.

## 2021-12-01 NOTE — Telephone Encounter (Signed)
Already responded to this on a prior call.  The Symbicort should not affect the Prolia in any way.

## 2021-12-05 ENCOUNTER — Emergency Department: Payer: Medicare Other

## 2021-12-05 ENCOUNTER — Emergency Department
Admission: EM | Admit: 2021-12-05 | Discharge: 2021-12-06 | Discharge status: Against Medical Advice | Payer: Medicare Other | Attending: Emergency Medicine | Admitting: Emergency Medicine

## 2021-12-05 ENCOUNTER — Other Ambulatory Visit: Payer: Self-pay

## 2021-12-05 DIAGNOSIS — M25561 Pain in right knee: Secondary | ICD-10-CM | POA: Diagnosis present

## 2021-12-05 MED ORDER — HYDROCODONE-ACETAMINOPHEN 5-325 MG PO TABS
2.0000 | ORAL_TABLET | Freq: Once | ORAL | Status: AC
  Administered 2021-12-05: 2 via ORAL
  Filled 2021-12-05: qty 2

## 2021-12-05 NOTE — ED Provider Notes (Signed)
[facility] Provider Note   Event Date/Time   First MD Initiated Contact with Patient 12/05/21 1942     (approximate) History  Knee Pain  HPI Kimberly Patton is a 66 y.o. female  Location: Right knee Duration: 2 weeks prior to arrival Timing: Stable since onset Severity: 10/10 Quality: Sharp aching pain Context: Patient states that for the last 2 weeks she has had significant pain and right knee pain that has kept her from walking Modifying factors: Any movement or walking on this leg worsens this pain she denies any relieving factors Associated Symptoms: Denies ROS: Patient currently denies any vision changes, tinnitus, difficulty speaking, facial droop, sore throat, chest pain, shortness of breath, abdominal pain, nausea/vomiting/diarrhea, dysuria, or weakness/numbness/paresthesias in any extremity   Physical Exam  Triage Vital Signs: ED Triage Vitals  Enc Vitals Group     BP 12/05/21 1858 (!) 150/140     Pulse Rate 12/05/21 1858 100     Resp 12/05/21 1858 18     Temp 12/05/21 1858 98.5 F (36.9 C)     Temp src --      SpO2 12/05/21 1858 97 %     Weight --      Height --      Head Circumference --      Peak Flow --      Pain Score 12/05/21 1857 10     Pain Loc --      Pain Edu? --      Excl. in [place]? --    Most recent vital signs: Vitals:   12/05/21 2036 12/06/21 0110  BP: (!) 158/90 (!) 144/82  Pulse: 82 80  Resp: 16 20  Temp:    SpO2: 94% 98%   General: Awake, oriented x4. CV:  Good peripheral perfusion.  Resp:  Normal effort.  Abd:  No distention.  Other:  Obese middle-aged Caucasian female laying in recliner in mild distress secondary to pain ED Results / Procedures / Treatments  RADIOLOGY ED MD interpretation: 4 view x-ray of the right knee interpreted by me and shows no acute bony abnormality but only shows degenerative changes -Agree with radiology assessment Official radiology report(s): No results  found. PROCEDURES: Critical Care performed: No Procedures MEDICATIONS ORDERED IN ED: Medications  HYDROcodone-acetaminophen (NORCO/VICODIN) 5-325 MG per tablet 2 tablet (2 tablets Oral Given 12/05/21 2036)   IMPRESSION / MDM / ASSESSMENT AND PLAN / ED COURSE  I reviewed the triage vital signs and the nursing notes.                             The patient is on the cardiac monitor to evaluate for evidence of arrhythmia and/or significant heart rate changes. Patient's presentation is most consistent with acute presentation with potential threat to life or bodily function. 66 year old Caucasian overweight female presents for worsening knee pain Given history, exam and workup I have low suspicion for fracture, dislocation, significant ligamentous injury, septic arthritis, gout flare, new autoimmune arthropathy, or gonococcal arthropathy.  Interventions: X-ray of the right knee does not show any evidence of acute abnormalities MRI pending Disposition: Care of this patient will be signed out to the oncoming physician at the end of my shift.  All pertinent patient information conveyed and all questions answered.  All further care and disposition decisions will be made by the oncoming physician.   FINAL CLINICAL IMPRESSION(S) / ED DIAGNOSES   Final diagnoses:  Acute pain  of right knee   Rx / DC Orders   ED Discharge Orders          Ordered    traMADol (ULTRAM) 50 MG tablet  Every 6 hours PRN        12/06/21 0045           Note:  This document was prepared using Dragon voice recognition software and may include unintentional dictation errors.   Naaman Plummer, MD 12/09/21 [phone]

## 2021-12-05 NOTE — ED Triage Notes (Signed)
Pt comes with c/o right knee pain. Pt states 2 weeks of this. Pt states she can't walk and bend knee. Pt states [facility] thinks she might have another tear. Pt suppose to have MRI but it hasn't happened yet.   Pt tearful in triage. 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

## 2021-12-06 ENCOUNTER — Ambulatory Visit: Admission: RE | Admit: 2021-12-06 | Payer: Medicare Other | Source: Ambulatory Visit

## 2021-12-06 MED ORDER — TRAMADOL HCL 50 MG PO TABS: 50.0000 mg | ORAL_TABLET | Freq: Four times a day (QID) | ORAL | 0 refills | Status: DC | PRN

## 2021-12-06 NOTE — ED Provider Notes (Addendum)
12:38 AM  Pt signed out to me at shift change.  Patient here for knee pain and is followed by [facility].  States she called them and they told her to come here to go and get her MRI.  Unfortunately the power has been out in our hospital and we have not been able to get imaging.  Explained to patient that MRI would likely not occur until the morning and we would not get an official read from the radiologist until the extremity MRI radiologist is in around 7 to 8 AM.  Also discussed with patient that if it does show ligamentous injury, meniscal tear, etc. no other signs of emergency such as septic joint, unstable fracture, that she will be discharged with outpatient follow-up.  She verbalized understanding.   Gennie Dib, Delice Bison, DO 12/06/21 0039   12:45 AM  Pt informed nursing staff that she did not want to wait any further for MRI.  She will sign out [place].  Will discharge home with a brief course of analgesia.  Again no sign of any life or limb threatening process today and I feel she is safe for outpatient follow-up and MRI of her knee as an outpatient.   Adebayo Ensminger, Delice Bison, DO 12/06/21 0045

## 2021-12-06 NOTE — ED Notes (Addendum)
Pt assisted to the restroom by this RN. Pt informed that the power was out in the hospital and the MRI machine will be down for a while but she will be able to have her scan performed once the power is back on.

## 2021-12-06 NOTE — ED Notes (Addendum)
Pt is demanding the leave stating that no one has come to see her or provide updates when she has been seen by EDP ([facility]) and was informed about the power outage delaying care. Pt signed an AMA form and escorted via wheelchair to her family.

## 2021-12-06 NOTE — Discharge Instructions (Addendum)
Please follow-up with [facility].   You are being provided a prescription for opiates (also known as narcotics) for pain control.  Opiates can be addictive and should only be used when absolutely necessary for pain control when other alternatives do not work.  We recommend you only use them for the recommended amount of time and only as prescribed.  Please do not take with other sedative medications or alcohol.  Please do not drive, operate machinery, make important decisions while taking opiates.  Please note that these medications can be addictive and have high abuse potential.  Patients can become addicted to narcotics after only taking them for a few days.  Please keep these medications locked away from children, teenagers or any family members with history of substance abuse.  Narcotic pain medicine may also make you constipated.  You may use over-the-counter medications such as MiraLAX, Colace to prevent constipation.  If you become constipated, you may use over-the-counter enemas as needed.  Itching and nausea are also common side effects of narcotic pain medication.  If you develop uncontrolled vomiting or a rash, please stop these medications and seek medical care.

## 2021-12-06 NOTE — ED Notes (Signed)
EDP [facility] at the bedside to provide update on the MRI machine being down.

## 2021-12-11 ENCOUNTER — Ambulatory Visit (INDEPENDENT_AMBULATORY_CARE_PROVIDER_SITE_OTHER): Payer: Medicare Other | Admitting: Vascular Surgery

## 2021-12-18 ENCOUNTER — Encounter: Payer: Self-pay | Admitting: Pulmonary Disease

## 2021-12-18 ENCOUNTER — Ambulatory Visit (INDEPENDENT_AMBULATORY_CARE_PROVIDER_SITE_OTHER): Payer: Medicare Other | Admitting: Pulmonary Disease

## 2021-12-18 VITALS — BP 110/70 | HR 70 | Temp 97.7°F | Ht 66.0 in | Wt 277.0 lb

## 2021-12-18 DIAGNOSIS — J4530 Mild persistent asthma, uncomplicated: Secondary | ICD-10-CM

## 2021-12-18 DIAGNOSIS — R0609 Other forms of dyspnea: Secondary | ICD-10-CM | POA: Diagnosis not present

## 2021-12-18 NOTE — Progress Notes (Signed)
Subjective:    Patient ID: Kimberly Patton, female    DOB: February 14, 1956, 66 y.o.   MRN: [id number] Patient Care Team: Jinny Sanders, MD as PCP - General [name], MD as PCP - Cardiology (Cardiology) Heniford, Esperanza Sheets, MD as Referring Physician (Surgery) Himmelrich, Bryson Ha, RD (Inactive) as Dietitian [facility])  Chief Complaint  Patient presents with   Follow-up    Review PFT--SOB with exertion.    HPI Patient is a 66 year old lifelong never smoker who presents for follow-up on the issue of asthma.  I last saw the patient on 27 December 2020 which was her first visit with me at that time.  She did and subsequently saw the nurse practitioner and Dr. Chesley Mires.  She has had issues with mild persistent asthma and is now controlled on Symbicort and as needed albuterol.  The patient also in the past was worked up for a lung nodule in the right lower lobe which was noted to be inflammatory and resolved on subsequent CT.  She presents today for follow-up.  She saw Dr. Halford Chessman on 4 May with an acute visit and was treated for mild exacerbation of asthma and allergic rhinitis.  She has responded well to the interventions provided during that visit.  Today she presents with her sensation of dyspnea being at baseline.  She feels that her asthmatic symptoms are controlled.  She does note that her dyspnea worsened after she was given Prolia.  She has multiple issues that she believes are side effects from the Prolia.  Recently developed a right torn meniscus. Since her most recent visit here she has not had any fevers, chills or sweats.  No cough or sputum production.  No hemoptysis.  No orthopnea or paroxysmal nocturnal dyspnea.  PFTs performed on 30 November 2021 show an FEV1 of 1.86 L or 72% predicted, FVC of 2.64 L or 72% predicted, FEV1/FVC of 67%.  No significant bronchodilator response.  Lung volumes normal with some air trapping noted.  Diffusion capacity normal.  ERV reduced to 24% indicating  potential minimal restrictive component due to obesity.  There was no determination of regards to the small airways.  Given the air trapping and the slightly reduced FEV1/FVC ratio this is indicative of mild obstructive defect.  The degree of the obstruction may be underestimated due to mild concomitant restriction due to obesity.  Review of Systems A 10 point review of systems was performed and it is as noted above otherwise negative.  Patient Active Problem List   Diagnosis Date Noted   Breast pain, right 10/10/2021   S/P breast lumpectomy 10/10/2021   Atrophic vaginitis 03/13/2021   Tinnitus of both ears 07/21/2020   Cervical radiculopathy 06/28/2020   Chronic venous insufficiency 05/29/2020   Lymphedema 05/29/2020   CAD (coronary artery disease) 05/29/2020   Cervical lymphadenitis 04/18/2020   Dysuria 03/25/2020   History of repair of hiatal hernia 02/02/2020   Bradycardia 01/01/2020   Orthostatic hypotension 01/01/2020   Globus sensation 11/23/2019   Elevated lipase 08/27/2019   Hypercholesteremia 06/09/2019   Chronic neuropathic pain 07/31/2018   Thyroid nodule 04/15/2018   Skin lesion of scalp 09/06/2017   Age-related osteoporosis without current pathological fracture 12/04/2016   Osteoarthritis of knee 07/17/2016   Numbness and tingling of both feet 01/27/2016   Meningioma ([place]) 01/18/2016   Family history of aortic stenosis 01/20/2015   Status post laparoscopic sleeve gastrectomy Dec 2014 06/08/2013   Morbid obesity ([place]) 12/08/2012   HTN (hypertension) 10/11/2011  Varicose veins of bilateral lower extremities with other complications 95/63/8756   Palpitations 09/12/2010   FIBROIDS, UTERUS 04/03/2007   Allergic rhinitis 04/03/2007   Asthma, mild intermittent 04/03/2007   Social History   Tobacco Use   Smoking status: Never   Smokeless tobacco: Never   Tobacco comments:    smoke at age [age]  Substance Use Topics   Alcohol use: Not Currently   Allergies   Allergen Reactions   Wasp Venom Anaphylaxis   Chlorhexidine Gluconate     Itching under breasts   Metronidazole Other (See Comments)    Chest tightness, neck tightness   Prednisone Other (See Comments)    Severe migraine / with taper dose   Sodium Hypochlorite Rash    Liquid clorox bleach   Current Meds  Medication Sig   albuterol (VENTOLIN HFA) 108 (90 Base) MCG/ACT inhaler Inhale 2 puffs into the lungs every 6 (six) hours as needed for wheezing or shortness of breath.   augmented betamethasone dipropionate (DIPROLENE-AF) 0.05 % ointment Apply 1 application. topically daily.   azelastine (ASTELIN) 0.1 % nasal spray Place 1 spray into both nostrils 2 (two) times daily. Use in each nostril as directed   budesonide-formoterol (SYMBICORT) 160-4.5 MCG/ACT inhaler Inhale 2 puffs into the lungs 2 (two) times daily.   Calcium Citrate-Vitamin D (CITRACAL + D PO) Take 2 tablets by mouth daily.    Cholecalciferol (VITAMIN D3) 1.25 MG (50000 UT) CAPS Take 1 capsule by mouth once a week.   Cyanocobalamin (VITAMIN B-12) 5000 MCG SUBL Place 5,000 mcg under the tongue daily.   denosumab (PROLIA) 60 MG/ML SOSY injection    diltiazem (CARDIZEM SR) 120 MG 12 hr capsule Take 1 capsule (120 mg total) by mouth 2 (two) times daily.   ELDERBERRY PO Take 1,000 mg by mouth daily.    fluticasone (FLONASE) 50 MCG/ACT nasal spray SPRAY 2 SPRAYS INTO EACH NOSTRIL EVERY DAY   gabapentin (NEURONTIN) 300 MG capsule Take 300 mg by mouth 2 (two) times daily.   Lactobacillus Rhamnosus, GG, (CULTURELLE IMMUNITY SUPPORT PO) Take 1 capsule by mouth daily.   Magnesium 500 MG TABS Take 1 tablet by mouth daily.   montelukast (SINGULAIR) 10 MG tablet Take 1 tablet (10 mg total) by mouth at bedtime.   Multiple Vitamins-Minerals (CENTRUM SILVER 50+WOMEN PO) Take 1 tablet by mouth daily.   nitroGLYCERIN (NITROSTAT) 0.4 MG SL tablet Place 0.4 mg under the tongue every 5 (five) minutes as needed for chest pain.   Omega-3 Fatty  Acids (FISH OIL) 1200 MG CAPS Take 1 capsule by mouth daily.   pantoprazole (PROTONIX) 40 MG tablet TAKE 1 TABLET BY MOUTH EVERY DAY   Riboflavin 100 MG CAPS Take 1 capsule by mouth daily.   rosuvastatin (CRESTOR) 10 MG tablet Take by mouth.   traMADol (ULTRAM) 50 MG tablet Take 1 tablet (50 mg total) by mouth every 6 (six) hours as needed.   TURMERIC PO Take 1 tablet by mouth daily.   Immunization History  Administered Date(s) Administered   Fluad Quad(high Dose 65+) 03/09/2021   Influenza Inj Mdck Quad Pf 02/13/2018   Influenza Whole 06/11/2005, 04/08/2009   Influenza,inj,Quad PF,6+ Mos 03/22/2017, 02/13/2018, 03/03/2019, 04/08/2020   Influenza-Unspecified 03/27/2016   PFIZER(Purple Top)SARS-COV-2 Vaccination 08/07/2019, 08/29/2019   PNEUMOCOCCAL CONJUGATE-20 03/09/2021   Pneumococcal Polysaccharide-23 03/22/2017   Td 06/11/2004   Tdap 01/20/2015       Objective:   Physical Exam BP 110/70 (BP Location: Left Arm, Cuff Size: Large)   Pulse 70  Temp 97.7 F (36.5 C) (Temporal)   Ht '5\' 6"'$  (1.676 m)   Wt 277 lb (125.6 kg)   SpO2 96%   BMI 44.71 kg/m  GENERAL: Morbidly obese woman, no acute distress, ambulatory with assistance of a cane.  No conversational dyspnea. HEAD: Normocephalic, atraumatic.  EYES: Pupils equal, round, reactive to light.  No scleral icterus.  MOUTH: Nose/mouth/throat not examined due to masking requirements for COVID 19. NECK: Supple. No thyromegaly. Trachea midline. No JVD.  No adenopathy. PULMONARY: Good air entry bilaterally.  No adventitious sounds. CARDIOVASCULAR: S1 and S2. Regular rate and rhythm.  No rubs, murmurs or gallops heard. ABDOMEN: Obese otherwise benign. MUSCULOSKELETAL: No joint deformity, no clubbing, no edema.  Right knee on brace due to torn meniscus. NEUROLOGIC: No focal deficit, no gait disturbance, speech is fluent. SKIN: Intact,warm,dry.  Lipedema of the lower extremities.  Chronic stasis changes. PSYCH: Mood and behavior  normal.     Assessment & Plan:     ICD-10-CM   1. Mild persistent asthma without complication  Z85.88    Continue Symbicort, montelukast as needed albuterol Continue nasal hygiene    2. DOE (dyspnea on exertion)  R06.09    Well compensated    3. Morbid obesity ([place])  E66.01    Weight loss recommended     Patient appears well compensated from the asthma standpoint.  We will see her in follow-up in 6 months time she is to contact us prior to that time should any new difficulties arise.  Renold Don, MD Advanced Bronchoscopy PCCM [facility]    *This note was dictated using voice recognition software/Dragon.  Despite best efforts to proofread, errors can occur which can change the meaning. Any transcriptional errors that result from this process are unintentional and may not be fully corrected at the time of dictation.

## 2021-12-18 NOTE — Patient Instructions (Signed)
Continue using your inhalers as you are doing.  We will see you in follow-up in 6 months time call sooner should any new problems arise.

## 2021-12-20 ENCOUNTER — Telehealth: Payer: Self-pay | Admitting: Cardiology

## 2021-12-20 NOTE — Telephone Encounter (Signed)
   Pre-operative Risk Assessment    Patient Name: Kimberly Patton  DOB: [birth year] MRN: [id number]      Request for Surgical Clearance    Procedure:   RIGHT KNEE ARTHROSCOPY   Date of Surgery:  Clearance TBD                               Surgeon:  DR [name] Surgeon's Group or Practice Name:  [facility] Phone number:  [phone] Fax number:  [phone]  Type of Clearance Requested:   - Medical    Type of Anesthesia:  Not Indicated   Additional requests/questions:    Signed, Eli Phillips   12/20/2021, 11:15 AM

## 2021-12-20 NOTE — Telephone Encounter (Signed)
Primary Cardiologist:Brian Agbor-Etang, MD   Preoperative team, please contact this patient and set up a phone call appointment for further preoperative risk assessment. Please obtain consent and complete medication review. Thank you for your help.   I confirm that guidance regarding antiplatelet and oral anticoagulation therapy has been completed and, if necessary, noted below (none requested).   Emmaline Life, NP-C    12/20/2021, 12:55 PM [place] [address] Office [phone] Fax [phone]

## 2021-12-21 ENCOUNTER — Telehealth: Payer: Self-pay | Admitting: *Deleted

## 2021-12-21 NOTE — Telephone Encounter (Signed)
Pt agreeable to plan of care for tele visit pre op appt 12/28/21 @ 9:20. Med rec and consent are done.

## 2021-12-21 NOTE — Telephone Encounter (Signed)
Pt agreeable to plan of care for tele visit pre op appt 12/28/21 @ 9:20. Med rec and consent are done.     Patient Consent for Virtual Visit        Kimberly Patton has provided verbal consent on 12/21/2021 for a virtual visit (video or telephone).   CONSENT FOR VIRTUAL VISIT FOR:  Kimberly Patton  By participating in this virtual visit I agree to the following:  I hereby voluntarily request, consent and authorize [facility] and its employed or contracted physicians, physician assistants, nurse practitioners or other licensed health care professionals (the Practitioner), to provide me with telemedicine health care services (the "Services") as deemed necessary by the treating Practitioner. I acknowledge and consent to receive the Services by the Practitioner via telemedicine. I understand that the telemedicine visit will involve communicating with the Practitioner through live audiovisual communication technology and the disclosure of certain medical information by electronic transmission. I acknowledge that I have been given the opportunity to request an in-person assessment or other available alternative prior to the telemedicine visit and am voluntarily participating in the telemedicine visit.  I understand that I have the right to withhold or withdraw my consent to the use of telemedicine in the course of my care at any time, without affecting my right to future care or treatment, and that the Practitioner or I may terminate the telemedicine visit at any time. I understand that I have the right to inspect all information obtained and/or recorded in the course of the telemedicine visit and may receive copies of available information for a reasonable fee.  I understand that some of the potential risks of receiving the Services via telemedicine include:  Delay or interruption in medical evaluation due to technological equipment failure or disruption; Information transmitted may not be sufficient  (e.g. poor resolution of images) to allow for appropriate medical decision making by the Practitioner; and/or  In rare instances, security protocols could fail, causing a breach of personal health information.  Furthermore, I acknowledge that it is my responsibility to provide information about my medical history, conditions and care that is complete and accurate to the best of my ability. I acknowledge that Practitioner's advice, recommendations, and/or decision may be based on factors not within their control, such as incomplete or inaccurate data provided by me or distortions of diagnostic images or specimens that may result from electronic transmissions. I understand that the practice of medicine is not an exact science and that Practitioner makes no warranties or guarantees regarding treatment outcomes. I acknowledge that a copy of this consent can be made available to me via my patient portal ([facility]), or I can request a printed copy by calling the office of [place].    I understand that my insurance will be billed for this visit.   I have read or had this consent read to me. I understand the contents of this consent, which adequately explains the benefits and risks of the Services being provided via telemedicine.  I have been provided ample opportunity to ask questions regarding this consent and the Services and have had my questions answered to my satisfaction. I give my informed consent for the services to be provided through the use of telemedicine in my medical care

## 2021-12-27 ENCOUNTER — Telehealth: Payer: Self-pay | Admitting: Pulmonary Disease

## 2021-12-27 NOTE — Telephone Encounter (Addendum)
Lm for Wells Guiles with [facility].  We have not received clearance form.

## 2021-12-28 ENCOUNTER — Encounter: Payer: Self-pay | Admitting: Nurse Practitioner

## 2021-12-28 ENCOUNTER — Ambulatory Visit (INDEPENDENT_AMBULATORY_CARE_PROVIDER_SITE_OTHER): Payer: Medicare Other | Admitting: Nurse Practitioner

## 2021-12-28 DIAGNOSIS — H20021 Recurrent acute iridocyclitis, right eye: Secondary | ICD-10-CM | POA: Diagnosis not present

## 2021-12-28 DIAGNOSIS — Z01810 Encounter for preprocedural cardiovascular examination: Secondary | ICD-10-CM

## 2021-12-28 DIAGNOSIS — M3501 Sicca syndrome with keratoconjunctivitis: Secondary | ICD-10-CM | POA: Diagnosis not present

## 2021-12-28 NOTE — Telephone Encounter (Signed)
Received form. It is addressed to Dr. Halford Chessman and was originally faxed to our [place] office.  Dr. Halford Chessman has only seen patient once for acute visit.  Dr. Patsey Berthold will need to completed form, however she will out of office until 01/01/2022.  Lm to make Wells Guiles with [facility].

## 2021-12-28 NOTE — Progress Notes (Signed)
Virtual Visit via Telephone Note   Because of Kimberly Patton's co-morbid illnesses, she is at least at moderate risk for complications without adequate follow up.  This format is felt to be most appropriate for this patient at this time.  The patient did not have access to video technology/had technical difficulties with video requiring transitioning to audio format only (telephone).  All issues noted in this document were discussed and addressed.  No physical exam could be performed with this format.  Please refer to the patient's chart for her consent to telehealth for [facility].  Evaluation Performed:  Preoperative cardiovascular risk assessment _____________   Date:  12/28/2021   Patient ID:  Kimberly Patton, DOB [birth year], MRN [id number] Patient Location:  Home Provider location:   Office  Primary Care Provider:  Jinny Sanders, MD Primary Cardiologist:  [name], MD  Chief Complaint / Patient Profile   66 y.o. y/o female with a h/o nonobstructive CAD by coronary CTA, paroxysmal SVT, diastolic dysfunction, hypertension, iron deficiency anemia, hiatal hernia, GERD, obesity s/p gastric sleeve who is pending right knee arthroscopy and presents today for telephonic preoperative cardiovascular risk assessment.  Past Medical History    Past Medical History:  Diagnosis Date   ALLERGIC RHINITIS 04/03/2007   Allergy    Not sure   Anemia    ((Pt Qnr Sub: Denies at visit from 08/28/2021 with endocrinology))    Anxiety    Arthritis Not sure   Asthma    hx of years ago - no longer a problem    ASTHMA, PERSISTENT, MODERATE 04/03/2007   Cancer ([place])    skin    Dyspnea on exertion    a. 11/2007 Echo: EF 60%.   Dysrhythmia    hx of heart arrhythmia 10-15 years ago - followed by DR Lovena Le - not seen in years    GERD (gastroesophageal reflux disease)    Hemophilia carrier    Hypertension    Joint pain    HIPS / LEGS   Knee injury    RT   Meningioma ([place])     Morbid obesity ([place])    Neuromuscular disorder ([place]) Not sure, since my fall   Paroxysmal SVT (supraventricular tachycardia) ([place])    a. 11/2011 48h Holter: RSR, rare PVC's, occas PAC's   Retinal tear of right eye 01/2016   Ventral hernia    Past Surgical History:  Procedure Laterality Date   [place]   BIOPSY THYROID     08/2021   BREAST CYST EXCISION Right 1994   Benign   CESAREAN SECTION     x2   CHOLECYSTECTOMY  1995   COLONOSCOPY WITH PROPOFOL N/A 05/09/2021   Procedure: COLONOSCOPY WITH PROPOFOL;  Surgeon: [name], MD;  Location: [facility] ENDOSCOPY;  Service: Gastroenterology;  Laterality: N/A;   EYE SURGERY  01/2016   Repair retinal tear   FOOT SURGERY  1995 / 1996   x2   HERNIA REPAIR     KNEE ARTHROSCOPY W/ MENISCAL REPAIR  07/25/2013   right knee Dr. Mardelle Matte   LAPAROSCOPIC GASTRIC SLEEVE RESECTION N/A 06/08/2013   Procedure: LAPAROSCOPIC GASTRIC SLEEVE RESECTION AND EXCISION OF SEBACEUS CYST FROM MID CHEST takedown of incarcerated ventral hernia and primary repair endoscopy;  Surgeon: [name], MD;  Location: [facility] ORS;  Service: General;  Laterality: N/A;   LAPAROSCOPIC NISSEN FUNDOPLICATION N/A [id number]   Procedure: LAPAROSCOPIC REPAIR LARGE SYMPTOMATIC HIATAL HERNIA WITH UPPER ENDOSCOPY;  Surgeon: Johnathan Hausen, MD;  Location: [facility] ORS;  Service: General;  Laterality: N/A;   moles  06/2013   removed 2 moles from under arm and  lowback   NECK SURGERY     occipital nerve damage- injections    OVARIAN CYST [place] EXCISIONAL BREAST BIOPSY Right 06/23/2021   Procedure: RADIOACTIVE SEED GUIDED EXCISIONAL RIGHT BREAST BIOPSY;  Surgeon: Johnathan Hausen, MD;  Location: [facility];  Service: General;  Laterality: Right;   RETINAL TEAR REPAIR CRYOTHERAPY Right 01/2016   [place]   SKIN CANCER EXCISION     TONSILLECTOMY     TUBAL LIGATION  [place]  1994   UPPER GI ENDOSCOPY  06/08/2013   Procedure: UPPER GI ENDOSCOPY;  Surgeon: [name], MD;  Location: [facility] ORS;  Service: General;;   US ECHOCARDIOGRAPHY  12/2011   WNL - EF 55-60%, mild MR, grade 1 diastolic dysnfiction (mild)   VENTRAL HERNIA REPAIR  2000    Allergies  Allergies  Allergen Reactions   Wasp Venom Anaphylaxis   Chlorhexidine Gluconate     Itching under breasts   Metronidazole Other (See Comments)    Chest tightness, neck tightness   Prednisone Other (See Comments)    Severe migraine / with taper dose   Sodium Hypochlorite Rash    Liquid clorox bleach    History of Present Illness    Kimberly Patton is a 66 y.o. female who presents via audio/video conferencing for a telehealth visit today.  Pt was last seen in cardiology clinic on 10/16/21 by Christell Faith, PA.  At that time Kimberly Patton was doing well.  The patient is now pending procedure as outlined above. Since her last visit, she  denies chest pain, shortness of breath, lower extremity edema, fatigue, palpitations, melena, hematuria, hemoptysis, diaphoresis, weakness, presyncope, syncope, orthopnea, and PND.   Home Medications    Prior to Admission medications   Medication Sig Start Date End Date Taking? Authorizing Provider  albuterol (VENTOLIN HFA) 108 (90 Base) MCG/ACT inhaler Inhale 2 puffs into the lungs every 6 (six) hours as needed for wheezing or shortness of breath. 10/10/21   Parrett, Fonnie Mu, NP  augmented betamethasone dipropionate (DIPROLENE-AF) 0.05 % ointment Apply 1 application. topically daily. 08/29/21   [provider]  azelastine (ASTELIN) 0.1 % nasal spray Place 1 spray into both nostrils 2 (two) times daily. Use in each nostril as directed 10/18/21   Chesley Mires, MD  budesonide-formoterol [facility]) 160-4.5 MCG/ACT inhaler Inhale 2 puffs into the lungs 2 (two) times daily. 10/12/21   Chesley Mires, MD  Calcium Citrate-Vitamin D (CITRACAL + D PO) Take 2 tablets by mouth daily.      [provider]  Cholecalciferol (VITAMIN D3) 1.25 MG (50000 UT) CAPS Take 1 capsule by mouth once a week. 09/05/21   Bedsole, Amy E, MD  Cyanocobalamin (VITAMIN B-12) 5000 MCG SUBL Place 5,000 mcg under the tongue daily.    [provider]  denosumab (PROLIA) 60 MG/ML SOSY injection  11/08/21   [provider]  diltiazem (CARDIZEM SR) 120 MG 12 hr capsule Take 1 capsule (120 mg total) by mouth 2 (two) times daily. 08/24/21   [name], MD  ELDERBERRY PO Take 1,000 mg by mouth daily.     [provider]  fluticasone (FLONASE) 50 MCG/ACT nasal spray SPRAY 2 SPRAYS INTO EACH NOSTRIL EVERY DAY 10/15/21   Bedsole, Amy E,  MD  gabapentin (NEURONTIN) 300 MG capsule Take 300 mg by mouth 2 (two) times daily. 02/23/21   [provider]  Lactobacillus Rhamnosus, GG, (CULTURELLE IMMUNITY SUPPORT PO) Take 1 capsule by mouth daily.    [provider]  Magnesium 500 MG TABS Take 1 tablet by mouth daily. Patient not taking: Reported on 12/21/2021 08/09/21   [provider]  Menaquinone-7 (VITAMIN K2 PO) Take 1 tablet by mouth daily.    [provider]  montelukast (SINGULAIR) 10 MG tablet Take 1 tablet (10 mg total) by mouth at bedtime. 10/12/21   Chesley Mires, MD  Multiple Vitamins-Minerals (CENTRUM SILVER 50+WOMEN PO) Take 1 tablet by mouth daily.    [provider]  nitroGLYCERIN (NITROSTAT) 0.4 MG SL tablet Place 0.4 mg under the tongue every 5 (five) minutes as needed for chest pain.    [provider]  Omega-3 Fatty Acids (FISH OIL) 1200 MG CAPS Take 1 capsule by mouth daily.    [provider]  pantoprazole (PROTONIX) 40 MG tablet TAKE 1 TABLET BY MOUTH EVERY DAY 10/25/21   Bedsole, Amy E, MD  Riboflavin 100 MG CAPS Take 1 capsule by mouth daily. 07/26/21   [provider]  rosuvastatin (CRESTOR) 10 MG tablet Take by mouth. 10/20/21   [provider]  traMADol (ULTRAM) 50 MG tablet Take 1  tablet (50 mg total) by mouth every 6 (six) hours as needed. 12/06/21 12/06/22  Ward, Delice Bison, DO  TURMERIC PO Take 1 tablet by mouth daily.    [provider]    Physical Exam    Vital Signs:  Kimberly Patton does not have vital signs available for review today.  Given telephonic nature of communication, physical exam is limited. AAOx3. NAD. Normal affect.  Speech and respirations are unlabored.  Accessory Clinical Findings    None  Assessment & Plan    1.  Preoperative Cardiovascular Risk Assessment: The patient is doing well from a cardiac perspective. Therefore, based on ACC/AHA guidelines, the patient would be at acceptable risk for the planned procedure without further cardiovascular testing. The patient was advised that if he develops new symptoms prior to surgery to contact our office to arrange for a follow-up visit, and he verbalized understanding. According to the Revised Cardiac Risk Index (RCRI), her Perioperative Risk of Major Cardiac Event is (%): 0.4. Her Functional Capacity in METs is: 5.62 according to the [facility] Activity Status Index (DASI).  A copy of this note will be routed to requesting surgeon.  Time:   Today, I have spent 10 minutes with the patient with telehealth technology discussing medical history, symptoms, and management plan.     Emmaline Life, NP-C    12/28/2021, 8:27 AM [place] [address], [address] Office [phone] Fax [phone]

## 2021-12-28 NOTE — Telephone Encounter (Signed)
Lm x2 for Wells Guiles with [facility].

## 2021-12-28 NOTE — Telephone Encounter (Signed)
Spoke to Canones with [facility] and requested form. Will await fax.

## 2021-12-29 NOTE — Telephone Encounter (Signed)
Lm x2 for Wells Guiles with [facility].

## 2021-12-29 NOTE — Telephone Encounter (Signed)
Spoke to sandra to [facility] and relayed below message.  She voiced her understanding.

## 2022-01-01 NOTE — Telephone Encounter (Signed)
Form has been completed by Dr. Patsey Berthold and faxed to [facility].   Left detailed message for Wells Guiles with [facility].

## 2022-01-02 ENCOUNTER — Encounter: Payer: Self-pay | Admitting: Family Medicine

## 2022-01-02 ENCOUNTER — Ambulatory Visit (INDEPENDENT_AMBULATORY_CARE_PROVIDER_SITE_OTHER): Payer: Medicare Other | Admitting: Family Medicine

## 2022-01-02 VITALS — BP 130/80 | HR 74 | Temp 97.9°F | Ht 66.0 in | Wt 277.0 lb

## 2022-01-02 DIAGNOSIS — Z01818 Encounter for other preprocedural examination: Secondary | ICD-10-CM

## 2022-01-02 DIAGNOSIS — M810 Age-related osteoporosis without current pathological fracture: Secondary | ICD-10-CM

## 2022-01-02 NOTE — Assessment & Plan Note (Signed)
She has completed cardiology and pulmonary preop evaluation and has been deemed a low risk.  All her other health problems are well controlled.  She has had recent labs without abnormality.

## 2022-01-02 NOTE — Patient Instructions (Addendum)
Look into option for types of Actonel ( ? EC, effervescent) that would be best tolerated in setting of gastric sleeve, hiatal hernia and reflux. Plan  re-check of bone density in 1 year. I will look into  CTX bone marker testing.

## 2022-01-02 NOTE — Progress Notes (Signed)
Patient ID: Kimberly Patton, female    DOB: November 22, 1955, 66 y.o.   MRN: [id number]  This visit was conducted in person.  BP 134/90   Pulse 74   Temp 97.9 F (36.6 C) (Oral)   Ht '5\' 6"'$  (1.676 m)   Wt 277 lb (125.6 kg) Comment: patient reported  SpO2 96%   BMI 44.71 kg/m    CC:  Chief Complaint  Patient presents with   Pre-op Exam    Subjective:   HPI: Kimberly Patton is a 66 y.o. female   h/o nonobstructive CAD by coronary CTA, paroxysmal SVT, diastolic dysfunction, hypertension, iron deficiency anemia, hiatal hernia, GERD, obesity s/p gastric sleeve presenting on 01/02/2022 for Pre-op Exam  She has possible right knee arthroscopy  for meniscal tear scheduled with Dr. Rhona Raider. If she fails PT over next month.. if still cannot extend knee she will move to arthroscopy.   Reviewed Preop evaluations cardiology, Swinyer NP on December 28, 2021 and Dr. Patsey Berthold pulmonary on 12/18/2021  She has been having some pain and right jaw pain since starting Prolia 11/08/2021. Progressed to body itching, tingling and bone aching. No rash.  Saw dentist.. treated with numbing medication.   BP slightly high in office today  BP Readings from Last 3 Encounters:  01/02/22 134/90  12/18/21 110/70  12/06/21 (!) 144/82   She wants to check a CTX test? I will look into this.   She is not interested in following up with Dr. Cruzita Lederer.  ENDO.       Relevant past medical, surgical, family and social history reviewed and updated as indicated. Interim medical history since our last visit reviewed. Allergies and medications reviewed and updated. Outpatient Medications Prior to Visit  Medication Sig Dispense Refill   albuterol (VENTOLIN HFA) 108 (90 Base) MCG/ACT inhaler Inhale 2 puffs into the lungs every 6 (six) hours as needed for wheezing or shortness of breath. 8 g 2   augmented betamethasone dipropionate (DIPROLENE-AF) 0.05 % ointment Apply 1 application. topically daily.     azelastine  (ASTELIN) 0.1 % nasal spray Place 1 spray into both nostrils 2 (two) times daily. Use in each nostril as directed 30 mL 12   budesonide-formoterol (SYMBICORT) 160-4.5 MCG/ACT inhaler Inhale 2 puffs into the lungs 2 (two) times daily. 1 each 5   Calcium Citrate-Vitamin D (CITRACAL + D PO) Take 2 tablets by mouth daily.      Cholecalciferol (VITAMIN D3) 1.25 MG (50000 UT) CAPS Take 1 capsule by mouth once a week. 12 capsule 3   Cyanocobalamin (VITAMIN B-12) 5000 MCG SUBL Place 5,000 mcg under the tongue daily.     denosumab (PROLIA) 60 MG/ML SOSY injection      diltiazem (CARDIZEM SR) 120 MG 12 hr capsule Take 1 capsule (120 mg total) by mouth 2 (two) times daily. 60 capsule 9   ELDERBERRY PO Take 1,000 mg by mouth daily.      famotidine (PEPCID) 40 MG tablet Take 40 mg by mouth at bedtime.     fluticasone (FLONASE) 50 MCG/ACT nasal spray SPRAY 2 SPRAYS INTO EACH NOSTRIL EVERY DAY 48 mL 1   gabapentin (NEURONTIN) 300 MG capsule Take 300 mg by mouth daily.     Lactobacillus Rhamnosus, GG, (CULTURELLE IMMUNITY SUPPORT PO) Take 1 capsule by mouth daily.     Menaquinone-7 (VITAMIN K2 PO) Take 1 tablet by mouth daily.     montelukast (SINGULAIR) 10 MG tablet Take 1 tablet (10 mg total) by mouth at  bedtime. 30 tablet 5   Multiple Vitamins-Minerals (CENTRUM SILVER 50+WOMEN PO) Take 1 tablet by mouth daily.     nitroGLYCERIN (NITROSTAT) 0.4 MG SL tablet Place 0.4 mg under the tongue every 5 (five) minutes as needed for chest pain.     Omega-3 Fatty Acids (FISH OIL) 1200 MG CAPS Take 1 capsule by mouth daily.     pantoprazole (PROTONIX) 40 MG tablet TAKE 1 TABLET BY MOUTH EVERY DAY 90 tablet 1   prednisoLONE acetate (PRED FORTE) 1 % ophthalmic suspension Place 1 drop into the right eye 3 (three) times daily.     Riboflavin 100 MG CAPS Take 1 capsule by mouth daily.     rosuvastatin (CRESTOR) 10 MG tablet Take by mouth.     traMADol (ULTRAM) 50 MG tablet Take 50 mg by mouth every 6 (six) hours as needed.      TURMERIC PO Take 1 tablet by mouth daily.     traMADol (ULTRAM) 50 MG tablet Take 1 tablet (50 mg total) by mouth every 6 (six) hours as needed. 15 tablet 0   No facility-administered medications prior to visit.     Per HPI unless specifically indicated in ROS section below Review of Systems  Constitutional:  Positive for fatigue. Negative for fever.  HENT:  Negative for congestion.   Eyes:  Negative for pain.  Respiratory:  Negative for cough and shortness of breath.   Cardiovascular:  Negative for chest pain, palpitations and leg swelling.  Gastrointestinal:  Negative for abdominal pain.  Genitourinary:  Negative for dysuria and vaginal bleeding.  Musculoskeletal:  Negative for back pain.  Neurological:  Positive for light-headedness and numbness. Negative for syncope and headaches.  Psychiatric/Behavioral:  Negative for dysphoric mood.    Objective:  BP 134/90   Pulse 74   Temp 97.9 F (36.6 C) (Oral)   Ht '5\' 6"'$  (1.676 m)   Wt 277 lb (125.6 kg) Comment: patient reported  SpO2 96%   BMI 44.71 kg/m   Wt Readings from Last 3 Encounters:  01/02/22 277 lb (125.6 kg)  12/18/21 277 lb (125.6 kg)  11/16/21 277 lb (125.6 kg)      Physical Exam Vitals and nursing note reviewed.  Constitutional:      General: She is not in acute distress.    Appearance: Normal appearance. She is well-developed. She is obese. She is not ill-appearing or toxic-appearing.  HENT:     Head: Normocephalic.     Right Ear: Hearing, tympanic membrane, ear canal and external ear normal.     Left Ear: Hearing, tympanic membrane, ear canal and external ear normal.     Nose: Nose normal.  Eyes:     General: Lids are normal. Lids are everted, no foreign bodies appreciated.     Conjunctiva/sclera: Conjunctivae normal.     Pupils: Pupils are equal, round, and reactive to light.  Neck:     Thyroid: No thyroid mass or thyromegaly.     Vascular: No carotid bruit.     Trachea: Trachea normal.   Cardiovascular:     Rate and Rhythm: Normal rate and regular rhythm.     Heart sounds: Normal heart sounds, S1 normal and S2 normal. No murmur heard.    No gallop.  Pulmonary:     Effort: Pulmonary effort is normal. No respiratory distress.     Breath sounds: Normal breath sounds. No wheezing, rhonchi or rales.  Abdominal:     General: Bowel sounds are normal. There is no distension  or abdominal bruit.     Palpations: Abdomen is soft. There is no fluid wave or mass.     Tenderness: There is no abdominal tenderness. There is no guarding or rebound.     Hernia: No hernia is present.  Musculoskeletal:     Cervical back: Normal range of motion and neck supple.  Lymphadenopathy:     Cervical: No cervical adenopathy.  Skin:    General: Skin is warm and dry.     Findings: No rash.  Neurological:     Mental Status: She is alert.     Cranial Nerves: No cranial nerve deficit.     Sensory: No sensory deficit.  Psychiatric:        Mood and Affect: Mood is not anxious or depressed.        Speech: Speech normal.        Behavior: Behavior normal. Behavior is cooperative.        Judgment: Judgment normal.       Results for orders placed or performed in visit on 11/30/21  Pulmonary Function Test [facility] Only  Result Value Ref Range   FVC-Pre 2.44 L   FVC-%Pred-Pre 72 %   FVC-Post 2.48 L   FVC-%Pred-Post 73 %   FVC-%Change-Post 1 %   FEV1-Pre 1.86 L   FEV1-%Pred-Pre 72 %   FEV1-Post 1.99 L   FEV1-%Pred-Post 77 %   FEV1-%Change-Post 7 %   FEV6-Pre 2.44 L   FEV6-%Pred-Pre 75 %   FEV6-Post 2.48 L   FEV6-%Pred-Post 76 %   FEV6-%Change-Post 1 %   Pre FEV1/FVC ratio 76 %   FEV1FVC-%Pred-Pre 99 %   Post FEV1/FVC ratio 80 %   FEV1FVC-%Change-Post 5 %   Pre FEV6/FVC Ratio 100 %   FEV6FVC-%Pred-Pre 104 %   Post FEV6/FVC ratio 100 %   FEV6FVC-%Pred-Post 104 %   FEF 25-75 Pre 1.44 L/sec   FEF2575-%Pred-Pre 65 %   FEF 25-75 Post 2.03 L/sec   FEF2575-%Pred-Post 92 %   FEF2575-%Change-Post  40 %   RV 2.70 L   RV % pred 122 %   TLC 5.60 L   TLC % pred 104 %   DLCO unc 26.28 ml/min/mmHg   DLCO unc % pred 124 %   DL/VA 5.16 ml/min/mmHg/L   DL/VA % pred 125 %     COVID 19 screen:  No recent travel or known exposure to COVID19 The patient denies respiratory symptoms of COVID 19 at this time. The importance of social distancing was discussed today.   Assessment and Plan Problem List Items Addressed This Visit     Age-related osteoporosis without current pathological fracture    Chronic, poorly controlled  She has had significant side effects to her first dose of Prolia in May 2023.  She would like to stop this medication.  We discussed this in detail.  She is not interested in following up in Dr. With Dr. Crista Curb.  She would like to try a trial of an oral bisphosphonate if we can get an  enteric-coated form. She will need repeat bone density in 1 year.  She states she has done some reading online and has seen that she needs to have a CTX test serum or NTX urine test to evaluate her risk for fracture coming off of Prolia.  I will look into this and let her know.      Pre-op evaluation - Primary    She has completed cardiology and pulmonary preop evaluation and has been deemed a  low risk.  All her other health problems are well controlled.  She has had recent labs without abnormality.          [name], MD

## 2022-01-02 NOTE — Assessment & Plan Note (Signed)
Chronic, poorly controlled  She has had significant side effects to her first dose of Prolia in May 2023.  She would like to stop this medication.  We discussed this in detail.  She is not interested in following up in Dr. With Dr. Crista Curb.  She would like to try a trial of an oral bisphosphonate if we can get an  enteric-coated form. She will need repeat bone density in 1 year.  She states she has done some reading online and has seen that she needs to have a CTX test serum or NTX urine test to evaluate her risk for fracture coming off of Prolia.  I will look into this and let her know.

## 2022-01-04 ENCOUNTER — Encounter: Payer: Self-pay | Admitting: Family Medicine

## 2022-01-05 DIAGNOSIS — H20021 Recurrent acute iridocyclitis, right eye: Secondary | ICD-10-CM | POA: Diagnosis not present

## 2022-01-07 DIAGNOSIS — I8310 Varicose veins of unspecified lower extremity with inflammation: Secondary | ICD-10-CM | POA: Insufficient documentation

## 2022-01-07 NOTE — Progress Notes (Unsigned)
Indication:  Patient presents with symptomatic varicose veins of the right lower extremity.  Procedure:  Sclerotherapy using hypertonic saline mixed with 1% Lidocaine was performed on the right lower extremity.  Compression wraps were placed.  The patient tolerated the procedure well.  Plan:  Follow up as needed.   

## 2022-01-08 ENCOUNTER — Ambulatory Visit (INDEPENDENT_AMBULATORY_CARE_PROVIDER_SITE_OTHER): Payer: Medicare Other | Admitting: Vascular Surgery

## 2022-01-08 ENCOUNTER — Encounter (INDEPENDENT_AMBULATORY_CARE_PROVIDER_SITE_OTHER): Payer: Self-pay | Admitting: Vascular Surgery

## 2022-01-08 DIAGNOSIS — I83893 Varicose veins of bilateral lower extremities with other complications: Secondary | ICD-10-CM

## 2022-01-08 DIAGNOSIS — I8310 Varicose veins of unspecified lower extremity with inflammation: Secondary | ICD-10-CM

## 2022-01-16 DIAGNOSIS — M5033 Other cervical disc degeneration, cervicothoracic region: Secondary | ICD-10-CM | POA: Diagnosis not present

## 2022-01-16 DIAGNOSIS — M9901 Segmental and somatic dysfunction of cervical region: Secondary | ICD-10-CM | POA: Diagnosis not present

## 2022-01-16 DIAGNOSIS — M62830 Muscle spasm of back: Secondary | ICD-10-CM | POA: Diagnosis not present

## 2022-01-16 DIAGNOSIS — M9903 Segmental and somatic dysfunction of lumbar region: Secondary | ICD-10-CM | POA: Diagnosis not present

## 2022-01-23 ENCOUNTER — Telehealth (INDEPENDENT_AMBULATORY_CARE_PROVIDER_SITE_OTHER): Payer: Self-pay

## 2022-01-23 NOTE — Telephone Encounter (Signed)
Spoke to pt and gave her the recommendations from Eulogio Ditch NP, she states verbal understanding

## 2022-01-23 NOTE — Telephone Encounter (Signed)
The vein is still angry and irritated.  It can take up to 4-6 weeks for it to fully heal.  This is completely normal.  She does not need to be seen sooner than her follow up.  She should take tylenol or ibuprofen.  There is no nothing that we can do other than to give it time to heal.

## 2022-01-23 NOTE — Telephone Encounter (Signed)
I spoke with the patient regarding getting her Lymphedema pump with BioTAB. I advised that I was working on getting the note addended and being sent to [facility] at [employer].

## 2022-01-23 NOTE — Telephone Encounter (Signed)
Pt states that she called yesterday about stinging in her leg where the vein was treated. Pt was told that stinging is normal.  Today she woke up with heat and stinging in the same area on the right leg. She is concerned and wants to know if she needs to be seen or what step is next? Please advise

## 2022-01-25 DIAGNOSIS — R1013 Epigastric pain: Secondary | ICD-10-CM | POA: Diagnosis not present

## 2022-01-25 DIAGNOSIS — G8929 Other chronic pain: Secondary | ICD-10-CM | POA: Diagnosis not present

## 2022-02-02 ENCOUNTER — Inpatient Hospital Stay
Admission: RE | Admit: 2022-02-02 | Discharge: 2022-02-02 | Disposition: A | Discharge status: Home / Self Care | Payer: Self-pay | Source: Ambulatory Visit

## 2022-02-02 ENCOUNTER — Ambulatory Visit
Admission: EM | Admit: 2022-02-02 | Discharge: 2022-02-02 | Disposition: A | Discharge status: Home / Self Care | Payer: Medicare Other | Attending: Urgent Care | Admitting: Urgent Care

## 2022-02-02 DIAGNOSIS — R350 Frequency of micturition: Secondary | ICD-10-CM | POA: Insufficient documentation

## 2022-02-02 DIAGNOSIS — R300 Dysuria: Secondary | ICD-10-CM | POA: Diagnosis not present

## 2022-02-02 LAB — POCT URINALYSIS DIP (MANUAL ENTRY)
Bilirubin, UA: NEGATIVE
Blood, UA: NEGATIVE
Glucose, UA: NEGATIVE mg/dL
Ketones, POC UA: NEGATIVE mg/dL
Nitrite, UA: NEGATIVE
Protein Ur, POC: NEGATIVE mg/dL
Spec Grav, UA: 1.025 (ref 1.010–1.025)
Urobilinogen, UA: 0.2 E.U./dL
pH, UA: 5.5 (ref 5.0–8.0)

## 2022-02-02 MED ORDER — PHENAZOPYRIDINE HCL 200 MG PO TABS: 200.0000 mg | ORAL_TABLET | Freq: Three times a day (TID) | ORAL | 0 refills | Status: DC | PRN

## 2022-02-02 NOTE — Discharge Instructions (Signed)
Please start Pyridium to address painful urination, urinary symptoms. Make sure you hydrate very well with plain water and a quantity of 80 ounces of water a day.  Please limit drinks that are considered urinary irritants such as soda, sweet tea, coffee, energy drinks, alcohol.  These can worsen your urinary and genital symptoms but also be the source of them.  I will let you know about your urine culture results through MyChart to see if we need to prescribe or change your antibiotics based off of those results.

## 2022-02-02 NOTE — ED Triage Notes (Signed)
Patient presents to Urgent Care with complaints of urinary freq x 1 week and dysuria x 2 days.Has not taken any meds for symptom relief.   Denies fever, hematuria, or chills.

## 2022-02-02 NOTE — ED Provider Notes (Signed)
Renae Gloss   MRN: [id number] DOB: 1955-06-30  Subjective:   Kimberly Patton is a 66 y.o. female presenting for 1 week history of recurrent dysuria, urinary frequency and urinary urgency.  Patient has had a history of the symptoms but the urine cultures have not shown urinary tract infection.  She admits that she does not hydrate well with plain water.  She does drink Crystal light which has artificial sweetener.  No soda, sweet tea, energy drinks, coffee.  No current facility-administered medications for this encounter.  Current Outpatient Medications:    albuterol (VENTOLIN HFA) 108 (90 Base) MCG/ACT inhaler, Inhale 2 puffs into the lungs every 6 (six) hours as needed for wheezing or shortness of breath., Disp: 8 g, Rfl: 2   augmented betamethasone dipropionate (DIPROLENE-AF) 0.05 % ointment, Apply 1 application. topically daily., Disp: , Rfl:    azelastine (ASTELIN) 0.1 % nasal spray, Place 1 spray into both nostrils 2 (two) times daily. Use in each nostril as directed, Disp: 30 mL, Rfl: 12   budesonide-formoterol (SYMBICORT) 160-4.5 MCG/ACT inhaler, Inhale 2 puffs into the lungs 2 (two) times daily., Disp: 1 each, Rfl: 5   Calcium Citrate-Vitamin D (CITRACAL + D PO), Take 2 tablets by mouth daily. , Disp: , Rfl:    Cholecalciferol (VITAMIN D3) 1.25 MG (50000 UT) CAPS, Take 1 capsule by mouth once a week., Disp: 12 capsule, Rfl: 3   Cyanocobalamin (VITAMIN B-12) 5000 MCG SUBL, Place 5,000 mcg under the tongue daily., Disp: , Rfl:    denosumab (PROLIA) 60 MG/ML SOSY injection, , Disp: , Rfl:    diltiazem (CARDIZEM SR) 120 MG 12 hr capsule, Take 1 capsule (120 mg total) by mouth 2 (two) times daily., Disp: 60 capsule, Rfl: 9   ELDERBERRY PO, Take 1,000 mg by mouth daily. , Disp: , Rfl:    famotidine (PEPCID) 40 MG tablet, Take 40 mg by mouth at bedtime., Disp: , Rfl:    fluticasone (FLONASE) 50 MCG/ACT nasal spray, SPRAY 2 SPRAYS INTO EACH NOSTRIL EVERY DAY, Disp: 48 mL, Rfl:  1   gabapentin (NEURONTIN) 300 MG capsule, Take 300 mg by mouth daily., Disp: , Rfl:    Lactobacillus Rhamnosus, GG, (CULTURELLE IMMUNITY SUPPORT PO), Take 1 capsule by mouth daily., Disp: , Rfl:    Menaquinone-7 (VITAMIN K2 PO), Take 1 tablet by mouth daily., Disp: , Rfl:    montelukast (SINGULAIR) 10 MG tablet, Take 1 tablet (10 mg total) by mouth at bedtime., Disp: 30 tablet, Rfl: 5   Multiple Vitamins-Minerals (CENTRUM SILVER 50+WOMEN PO), Take 1 tablet by mouth daily., Disp: , Rfl:    nitroGLYCERIN (NITROSTAT) 0.4 MG SL tablet, Place 0.4 mg under the tongue every 5 (five) minutes as needed for chest pain., Disp: , Rfl:    Omega-3 Fatty Acids (FISH OIL) 1200 MG CAPS, Take 1 capsule by mouth daily., Disp: , Rfl:    pantoprazole (PROTONIX) 40 MG tablet, TAKE 1 TABLET BY MOUTH EVERY DAY, Disp: 90 tablet, Rfl: 1   prednisoLONE acetate (PRED FORTE) 1 % ophthalmic suspension, Place 1 drop into the right eye 3 (three) times daily., Disp: , Rfl:    Riboflavin 100 MG CAPS, Take 1 capsule by mouth daily., Disp: , Rfl:    rosuvastatin (CRESTOR) 10 MG tablet, Take by mouth., Disp: , Rfl:    traMADol (ULTRAM) 50 MG tablet, Take 50 mg by mouth every 6 (six) hours as needed., Disp: , Rfl:    TURMERIC PO, Take 1 tablet by mouth daily.,  Disp: , Rfl:    Allergies  Allergen Reactions   Wasp Venom Anaphylaxis   Chlorhexidine Gluconate     Itching under breasts   Metronidazole Other (See Comments)    Chest tightness, neck tightness   Prednisone Other (See Comments)    Severe migraine / with taper dose   Sodium Hypochlorite Rash    Liquid clorox bleach    Past Medical History:  Diagnosis Date   ALLERGIC RHINITIS 04/03/2007   Allergy    Not sure   Anemia    ((Pt Qnr Sub: Denies at visit from 08/28/2021 with endocrinology))    Anxiety    Arthritis Not sure   Asthma    hx of years ago - no longer a problem    ASTHMA, PERSISTENT, MODERATE 04/03/2007   Cancer ([place])    skin    Dyspnea on exertion     a. 11/2007 Echo: EF 60%.   Dysrhythmia    hx of heart arrhythmia 10-15 years ago - followed by DR Lovena Le - not seen in years    GERD (gastroesophageal reflux disease)    Hemophilia carrier    Hypertension    Joint pain    HIPS / LEGS   Knee injury    RT   Meningioma ([place])    Morbid obesity ([place])    Neuromuscular disorder ([place]) Not sure, since my fall   Paroxysmal SVT (supraventricular tachycardia) ([place])    a. 11/2011 48h Holter: RSR, rare PVC's, occas PAC's   Retinal tear of right eye 01/2016   Ventral hernia      Past Surgical History:  Procedure Laterality Date   [place]   BIOPSY THYROID     08/2021   BREAST CYST EXCISION Right 1994   Benign   CESAREAN SECTION     x2   CHOLECYSTECTOMY  1995   COLONOSCOPY WITH PROPOFOL N/A 05/09/2021   Procedure: COLONOSCOPY WITH PROPOFOL;  Surgeon: [name], MD;  Location[facility] END[facility];  Service: Gastroenterology;  Laterality: N/A;   EYE SURGERY  01/2016   Repair retinal tear   FOOT SURGERY  1995 / 1996   x2   HERNIA REPAIR     KNEE ARTHROSCOPY W/ MENISCAL REPAIR  07/25/2013   right knee Dr. Mardelle Matte   LAPAROSCOPIC GASTRIC SLEEVE RESECTION N/A 06/08/2013   Procedure: LAPAROSCOPIC GASTRIC SLEEVE RESECTION AND EXCISION OF SEBACEUS CYST FROM MID CHEST takedown of incarcerated ventral hernia and primary repair endoscopy;  Surgeon: [name], MD;  Location: [facility];  Service: General;  Laterality: N/A;   LAPAROSCOPIC NISSEN FUNDOPLICATION N/A 04/54/0981   Procedure: LAPAROSCOPIC REPAIR LARGE SYMPTOMATIC HIATAL HERNIA WITH UPPER ENDOSCOPY;  Surgeon: Johnathan Hausen, MD;  Location: [facility];  Service: General;  Laterality: N/A;   moles  06/2013   removed 2 moles from under arm and  lowback   NECK SURGERY     occipital nerve damage- injections    OVARIAN CYST [place] EXCISIONAL BREAST BIOPSY Right 06/23/2021   Procedure:  RADIOACTIVE SEED GUIDED EXCISIONAL RIGHT BREAST BIOPSY;  Surgeon: Johnathan Hausen, MD;  Location: [facility];  Service: General;  Laterality: Right;   RETINAL TEAR REPAIR CRYOTHERAPY Right 01/2016   [place]   SKIN CANCER EXCISION     TONSILLECTOMY     TUBAL LIGATION  Think 1994   UPPER GI ENDOSCOPY  06/08/2013   Procedure: UPPER  GI ENDOSCOPY;  Surgeon: [name], MD;  Location: [facility];  Service: General;;   US ECHOCARDIOGRAPHY  12/2011   WNL - EF 55-60%, mild MR, grade 1 diastolic dysnfiction (mild)   VENTRAL HERNIA REPAIR  2000    Family History  Problem Relation Age of Onset   Breast cancer Mother    Diabetes Mother    Hypertension Mother    Kidney failure Mother    Diabetes Father    Hypertension Father    Diabetes Other    Hypertension Other    Stroke Other    Hemophilia Other     Social History   Tobacco Use   Smoking status: Never   Smokeless tobacco: Never   Tobacco comments:    smoke at age 62-12  Vaping Use   Vaping Use: Never used  Substance Use Topics   Alcohol use: Not Currently   Drug use: No    ROS   Objective:   Vitals: BP 135/84   Pulse 68   Temp 97.9 F (36.6 C)   Resp 18   SpO2 96%   Physical Exam Constitutional:      General: She is not in acute distress.    Appearance: Normal appearance. She is well-developed. She is not ill-appearing, toxic-appearing or diaphoretic.  HENT:     Head: Normocephalic and atraumatic.     Nose: Nose normal.     Mouth/Throat:     Mouth: Mucous membranes are moist.  Eyes:     General: No scleral icterus.       Right eye: No discharge.        Left eye: No discharge.     Extraocular Movements: Extraocular movements intact.     Conjunctiva/sclera: Conjunctivae normal.  Cardiovascular:     Rate and Rhythm: Normal rate.  Pulmonary:     Effort: Pulmonary effort is normal.  Abdominal:     General: Bowel sounds are normal. There is no distension.     Palpations: Abdomen is soft.  There is no mass.     Tenderness: There is no abdominal tenderness. There is no right CVA tenderness, left CVA tenderness, guarding or rebound.  Skin:    General: Skin is warm and dry.  Neurological:     General: No focal deficit present.     Mental Status: She is alert and oriented to person, place, and time.  Psychiatric:        Mood and Affect: Mood normal.        Behavior: Behavior normal.        Thought Content: Thought content normal.        Judgment: Judgment normal.     Results for orders placed or performed during the hospital encounter of 02/02/22 (from the past 24 hour(s))  POCT urinalysis dipstick     Status: Abnormal   Collection Time: 02/02/22  8:17 AM  Result Value Ref Range   Color, UA yellow yellow   Clarity, UA clear clear   Glucose, UA negative negative mg/dL   Bilirubin, UA negative negative   Ketones, POC UA negative negative mg/dL   Spec Grav, UA 1.025 1.010 - 1.025   Blood, UA negative negative   pH, UA 5.5 5.0 - 8.0   Protein Ur, POC negative negative mg/dL   Urobilinogen, UA 0.2 0.2 or 1.0 E.U./dL   Nitrite, UA Negative Negative   Leukocytes, UA Small (1+) (A) Negative    Assessment and Plan :   PDMP not reviewed this  encounter.  1. Urinary frequency   2. Dysuria    We will hold off on antibiotic use for now.  Urine culture pending.  Emphasized need to hydrate much better on a daily basis.  Use Pyridium for symptomatic relief. Counseled patient on potential for adverse effects with medications prescribed/recommended today, ER and return-to-clinic precautions discussed, patient verbalized understanding.    Jaynee Eagles, [place] 02/02/22 [phone]

## 2022-02-03 LAB — URINE CULTURE

## 2022-02-05 ENCOUNTER — Ambulatory Visit (INDEPENDENT_AMBULATORY_CARE_PROVIDER_SITE_OTHER): Payer: Medicare Other | Admitting: Vascular Surgery

## 2022-02-05 ENCOUNTER — Encounter (INDEPENDENT_AMBULATORY_CARE_PROVIDER_SITE_OTHER): Payer: Self-pay | Admitting: Vascular Surgery

## 2022-02-05 ENCOUNTER — Telehealth: Payer: Self-pay | Admitting: Family Medicine

## 2022-02-05 ENCOUNTER — Other Ambulatory Visit: Payer: Self-pay | Admitting: Family Medicine

## 2022-02-05 VITALS — BP 127/77 | HR 75 | Resp 17 | Ht 66.0 in | Wt 286.0 lb

## 2022-02-05 DIAGNOSIS — I8310 Varicose veins of unspecified lower extremity with inflammation: Secondary | ICD-10-CM

## 2022-02-05 DIAGNOSIS — Z79899 Other long term (current) drug therapy: Secondary | ICD-10-CM

## 2022-02-05 NOTE — Progress Notes (Signed)
    MRN : [id number]  Kimberly Patton is a 66 y.o. (January 03, 1956) female who presents with chief complaint of painful varicose veins.   Procedure:  Sclerotherapy using hypertonic saline mixed with 1% Lidocaine was performed on lower extremities bilateral.  Compression wraps were placed.  The patient tolerated the procedure well.  Plan:  Follow up as arranged

## 2022-02-05 NOTE — Progress Notes (Signed)
Hey,  I am ordering this CTX (C telopeptide test) for the first time.  I wanted to make sure there were no instructions the patient needs for this test and also to verify that I do not need to order it in a particular way.  Thank you!

## 2022-02-05 NOTE — Telephone Encounter (Signed)
Hey,  I am ordering this CTX (C telopeptide test) for the first time.  I wanted to make sure there were no instructions the patient needs for this test and also to verify that I do not need to order it in a particular way.  Thank you!

## 2022-02-08 ENCOUNTER — Other Ambulatory Visit (INDEPENDENT_AMBULATORY_CARE_PROVIDER_SITE_OTHER): Payer: Medicare Other

## 2022-02-08 DIAGNOSIS — Z79899 Other long term (current) drug therapy: Secondary | ICD-10-CM

## 2022-02-09 ENCOUNTER — Other Ambulatory Visit: Payer: Medicare Other

## 2022-02-13 LAB — C-TERMINAL TELOPEPTIDE: C-Telopeptide (CTx): 76 pg/mL

## 2022-02-15 ENCOUNTER — Ambulatory Visit (INDEPENDENT_AMBULATORY_CARE_PROVIDER_SITE_OTHER): Payer: Medicare Other | Admitting: Family Medicine

## 2022-02-15 ENCOUNTER — Encounter: Payer: Self-pay | Admitting: Family Medicine

## 2022-02-15 VITALS — BP 132/84 | HR 67 | Temp 97.7°F | Ht 66.0 in | Wt 277.0 lb

## 2022-02-15 DIAGNOSIS — R519 Headache, unspecified: Secondary | ICD-10-CM

## 2022-02-15 NOTE — Progress Notes (Signed)
Patient ID: Kimberly Patton, female    DOB: [birth year], 66 y.o.   MRN: [id number]  This visit was conducted in person.  BP 132/84   Pulse 67   Temp 97.7 F (36.5 C) (Temporal)   Ht '5\' 6"'$  (1.676 m)   Wt 277 lb (125.6 kg) Comment: Patient reported.  SpO2 97%   BMI 44.71 kg/m    CC:  Chief Complaint  Patient presents with   Headache    Ongoing for a few weeks. Last night has stabbing pain in the head on the left side.     Subjective:   HPI: Kimberly Patton is a 66 y.o. female with history of meningioma presenting on 02/15/2022 for Headache (Ongoing for a few weeks. Last night has stabbing pain in the head on the left side. )   She has noted months of pressure in left head.. had thought possibly due to  prolia SE but now worsening. She reports she is having left sided stabbing pain in her head over site where a cyst was removed and has scar.   No new visual changes except in right eye has inflammation  and floaters per eye MD.  No new neuro changes.  No nausea.   No congestion, no allergy symptoms   Last imaging of meningioma  was brain MRI 06/02/20 IMPRESSION: 1. No abnormality seen to explain the presenting symptoms. No vestibular schwannoma. Brain parenchyma itself is normal. 2. 2 cm meningioma at the left frontoparietal vertex. Slight indentation of the brain but no mass effect or edema. No evidence of calvarial invasion.     2017 : IMPRESSION: 1. 1.9 x 1.8 x 1.7 cm meningioma at the left frontoparietal convexity. No associated edema. 2. Otherwise normal brain MRI for patient age. 3. 2.6 x 1.8 cm lobulated nonenhancing lesion at the left frontoparietal scalp, suspected to reflect a sebaceous cyst. Correlation with physical exam recommended.  Relevant past medical, surgical, family and social history reviewed and updated as indicated. Interim medical history since our last visit reviewed. Allergies and medications reviewed and updated. Outpatient Medications  Prior to Visit  Medication Sig Dispense Refill   albuterol (VENTOLIN HFA) 108 (90 Base) MCG/ACT inhaler Inhale 2 puffs into the lungs every 6 (six) hours as needed for wheezing or shortness of breath. 8 g 2   budesonide-formoterol (SYMBICORT) 160-4.5 MCG/ACT inhaler Inhale 2 puffs into the lungs 2 (two) times daily. 1 each 5   Calcium Citrate-Vitamin D (CITRACAL + D PO) Take 2 tablets by mouth daily.      Cholecalciferol (VITAMIN D3) 1.25 MG (50000 UT) CAPS Take 1 capsule by mouth once a week. 12 capsule 3   Coenzyme Q10-Vitamin E (QUNOL ULTRA COQ10) 100-150 MG-UNIT CAPS Take 100 mg by mouth daily.     COLLAGEN PO Take 2,000 mg by mouth daily.     Cyanocobalamin (VITAMIN B-12) 5000 MCG SUBL Place 5,000 mcg under the tongue daily.     denosumab (PROLIA) 60 MG/ML SOSY injection      diltiazem (CARDIZEM SR) 120 MG 12 hr capsule Take 1 capsule (120 mg total) by mouth 2 (two) times daily. 60 capsule 9   ELDERBERRY PO Take 1,000 mg by mouth daily.      Emollient (COLLAGEN PREMIUM SKIN EX) Apply topically.     famotidine (PEPCID) 40 MG tablet Take 40 mg by mouth at bedtime.     fluticasone (FLONASE) 50 MCG/ACT nasal spray SPRAY 2 SPRAYS INTO EACH NOSTRIL EVERY DAY 48 mL  1   gabapentin (NEURONTIN) 300 MG capsule Take 300 mg by mouth daily.     Lactobacillus Rhamnosus, GG, (CULTURELLE IMMUNITY SUPPORT PO) Take 1 capsule by mouth daily.     Menaquinone-7 (VITAMIN K2 PO) Take 1 tablet by mouth daily.     montelukast (SINGULAIR) 10 MG tablet Take 1 tablet (10 mg total) by mouth at bedtime. 30 tablet 5   Multiple Vitamins-Minerals (CENTRUM SILVER 50+WOMEN PO) Take 1 tablet by mouth daily.     nitroGLYCERIN (NITROSTAT) 0.4 MG SL tablet Place 0.4 mg under the tongue every 5 (five) minutes as needed for chest pain.     Omega-3 Fatty Acids (FISH OIL) 1200 MG CAPS Take 1 capsule by mouth daily.     pantoprazole (PROTONIX) 40 MG tablet TAKE 1 TABLET BY MOUTH EVERY DAY 90 tablet 1   Riboflavin 100 MG CAPS Take 1  capsule by mouth daily.     rosuvastatin (CRESTOR) 10 MG tablet Take by mouth.     traMADol (ULTRAM) 50 MG tablet Take 50 mg by mouth every 6 (six) hours as needed.     TURMERIC PO Take 1 tablet by mouth daily.     phenazopyridine (PYRIDIUM) 200 MG tablet Take 1 tablet (200 mg total) by mouth 3 (three) times daily as needed for pain. (Patient not taking: Reported on 02/15/2022) 15 tablet 0   No facility-administered medications prior to visit.     Per HPI unless specifically indicated in ROS section below Review of Systems  Constitutional:  Negative for fatigue and fever.  HENT:  Negative for congestion.   Eyes:  Negative for pain.  Respiratory:  Negative for cough and shortness of breath.   Cardiovascular:  Negative for chest pain, palpitations and leg swelling.  Gastrointestinal:  Negative for abdominal pain.  Genitourinary:  Negative for dysuria and vaginal bleeding.  Musculoskeletal:  Negative for back pain.  Neurological:  Positive for headaches. Negative for dizziness, tremors, syncope, weakness, light-headedness and numbness.  Psychiatric/Behavioral:  Negative for dysphoric mood.    Objective:  BP 132/84   Pulse 67   Temp 97.7 F (36.5 C) (Temporal)   Ht '5\' 6"'$  (1.676 m)   Wt 277 lb (125.6 kg) Comment: Patient reported.  SpO2 97%   BMI 44.71 kg/m   Wt Readings from Last 3 Encounters:  02/15/22 277 lb (125.6 kg)  02/05/22 286 lb (129.7 kg)  01/02/22 277 lb (125.6 kg)      Physical Exam Constitutional:      General: She is not in acute distress.    Appearance: Normal appearance. She is well-developed. She is obese. She is not ill-appearing or toxic-appearing.  HENT:     Head: Normocephalic.     Right Ear: Hearing, tympanic membrane, ear canal and external ear normal. Tympanic membrane is not erythematous, retracted or bulging.     Left Ear: Hearing, tympanic membrane, ear canal and external ear normal. Tympanic membrane is not erythematous, retracted or bulging.      Nose: No mucosal edema or rhinorrhea.     Right Sinus: No maxillary sinus tenderness or frontal sinus tenderness.     Left Sinus: No maxillary sinus tenderness or frontal sinus tenderness.     Mouth/Throat:     Pharynx: Uvula midline.  Eyes:     General: Lids are normal. Lids are everted, no foreign bodies appreciated.     Conjunctiva/sclera: Conjunctivae normal.     Pupils: Pupils are equal, round, and reactive to light.  Neck:  Thyroid: No thyroid mass or thyromegaly.     Vascular: No carotid bruit.     Trachea: Trachea normal.  Cardiovascular:     Rate and Rhythm: Normal rate and regular rhythm.     Pulses: Normal pulses.     Heart sounds: Normal heart sounds, S1 normal and S2 normal. No murmur heard.    No friction rub. No gallop.  Pulmonary:     Effort: Pulmonary effort is normal. No tachypnea or respiratory distress.     Breath sounds: Normal breath sounds. No decreased breath sounds, wheezing, rhonchi or rales.  Abdominal:     General: Bowel sounds are normal.     Palpations: Abdomen is soft.     Tenderness: There is no abdominal tenderness.  Musculoskeletal:     Cervical back: Normal range of motion and neck supple.  Skin:    General: Skin is warm and dry.     Findings: No rash.  Neurological:     Mental Status: She is alert and oriented to person, place, and time.     GCS: GCS eye subscore is 4. GCS verbal subscore is 5. GCS motor subscore is 6.     Cranial Nerves: No cranial nerve deficit.     Sensory: No sensory deficit.     Motor: No abnormal muscle tone.     Coordination: Coordination normal.     Gait: Gait normal.     Deep Tendon Reflexes: Reflexes are normal and symmetric.     Comments: Nml cerebellar exam   No papilledema  Psychiatric:        Mood and Affect: Mood is not anxious or depressed.        Speech: Speech normal.        Behavior: Behavior normal. Behavior is cooperative.        Thought Content: Thought content normal.        Cognition and  Memory: Memory is not impaired. She does not exhibit impaired recent memory or impaired remote memory.        Judgment: Judgment normal.          COVID 19 screen:  No recent travel or known exposure to COVID19 The patient denies respiratory symptoms of COVID 19 at this time. The importance of social distancing was discussed today.   Assessment and Plan   Problem List Items Addressed This Visit     Pressure in head - Primary    Chronic now ongoing for several months.  This symptom is separate from the scalp tenderness and sharp pain. The pressure in the head is potentially remaining side effect of Prolia as she noted it after taking this medication.  Of note she is also having joint pain and tooth pain associated with her Prolia and dental issues. There is no clear sign of infection.  No neuro changes on exam.  No indication for imaging.  If her symptoms are not improving over time or further she gets from [place], we can consider reevaluating the meningioma with a repeat MRI brain      Scalp tenderness    Acute  The pain  on her scalp is associated with the scar from removal of sebaceous cyst earlier this year.  There is no evidence of regrowth of the sebaceous cyst. She will restart the topical steroid that helped with pain initially after cyst removal.  Pain is potentially due to regrowth of nerves in this area.  No rash noted        Jerolene Kupfer  Diona Browner, MD

## 2022-02-15 NOTE — Telephone Encounter (Signed)
Called patient scheduled today with [facility].  No further action needed at this time.

## 2022-02-15 NOTE — Assessment & Plan Note (Signed)
Chronic now ongoing for several months.  This symptom is separate from the scalp tenderness and sharp pain. The pressure in the head is potentially remaining side effect of Prolia as she noted it after taking this medication.  Of note she is also having joint pain and tooth pain associated with her Prolia and dental issues. There is no clear sign of infection.  No neuro changes on exam.  No indication for imaging.  If her symptoms are not improving over time or further she gets from [place], we can consider reevaluating the meningioma with a repeat MRI brain

## 2022-02-15 NOTE — Assessment & Plan Note (Signed)
Acute  The pain  on her scalp is associated with the scar from removal of sebaceous cyst earlier this year.  There is no evidence of regrowth of the sebaceous cyst. She will restart the topical steroid that helped with pain initially after cyst removal.  Pain is potentially due to regrowth of nerves in this area.  No rash noted

## 2022-02-20 ENCOUNTER — Other Ambulatory Visit: Payer: Self-pay | Admitting: Ophthalmology

## 2022-02-20 ENCOUNTER — Other Ambulatory Visit
Admission: RE | Admit: 2022-02-20 | Discharge: 2022-02-20 | Disposition: A | Discharge status: Home / Self Care | Payer: Medicare Other | Source: Ambulatory Visit | Attending: Ophthalmology | Admitting: Ophthalmology

## 2022-02-20 DIAGNOSIS — H53121 Transient visual loss, right eye: Secondary | ICD-10-CM | POA: Diagnosis not present

## 2022-02-20 DIAGNOSIS — H43813 Vitreous degeneration, bilateral: Secondary | ICD-10-CM | POA: Diagnosis not present

## 2022-02-20 LAB — CBC
HCT: 37.9 % (ref 36.0–46.0)
Hemoglobin: 12.1 g/dL (ref 12.0–15.0)
MCH: 26.4 pg (ref 26.0–34.0)
MCHC: 31.9 g/dL (ref 30.0–36.0)
MCV: 82.6 fL (ref 80.0–100.0)
Platelets: 323 10*3/uL (ref 150–400)
RBC: 4.59 MIL/uL (ref 3.87–5.11)
RDW: 14.9 % (ref 11.5–15.5)
WBC: 7.8 10*3/uL (ref 4.0–10.5)
nRBC: 0 % (ref 0.0–0.2)

## 2022-02-20 LAB — C-REACTIVE PROTEIN: CRP: 0.7 mg/dL (ref <1.0)

## 2022-02-20 LAB — SEDIMENTATION RATE: Sed Rate: 35 mm/hr — ABNORMAL HIGH (ref 0–30)

## 2022-02-21 ENCOUNTER — Other Ambulatory Visit: Payer: Self-pay | Admitting: Ophthalmology

## 2022-02-21 ENCOUNTER — Other Ambulatory Visit: Payer: Self-pay | Admitting: Family Medicine

## 2022-02-21 ENCOUNTER — Encounter: Payer: Self-pay | Admitting: Family Medicine

## 2022-02-21 DIAGNOSIS — H53121 Transient visual loss, right eye: Secondary | ICD-10-CM

## 2022-02-21 DIAGNOSIS — R631 Polydipsia: Secondary | ICD-10-CM

## 2022-02-22 ENCOUNTER — Ambulatory Visit
Admission: RE | Admit: 2022-02-22 | Discharge: 2022-02-22 | Disposition: A | Discharge status: Home / Self Care | Payer: Medicare Other | Source: Ambulatory Visit | Attending: Ophthalmology | Admitting: Ophthalmology

## 2022-02-22 ENCOUNTER — Encounter: Payer: Self-pay | Admitting: Family Medicine

## 2022-02-22 ENCOUNTER — Other Ambulatory Visit (INDEPENDENT_AMBULATORY_CARE_PROVIDER_SITE_OTHER): Payer: Medicare Other

## 2022-02-22 ENCOUNTER — Ambulatory Visit: Payer: Self-pay

## 2022-02-22 DIAGNOSIS — I6522 Occlusion and stenosis of left carotid artery: Secondary | ICD-10-CM | POA: Diagnosis not present

## 2022-02-22 DIAGNOSIS — R631 Polydipsia: Secondary | ICD-10-CM | POA: Diagnosis not present

## 2022-02-22 DIAGNOSIS — H53121 Transient visual loss, right eye: Secondary | ICD-10-CM | POA: Diagnosis not present

## 2022-02-22 DIAGNOSIS — H5461 Unqualified visual loss, right eye, normal vision left eye: Secondary | ICD-10-CM | POA: Diagnosis not present

## 2022-02-22 LAB — GLUCOSE, RANDOM: Glucose, Bld: 89 mg/dL (ref 70–99)

## 2022-02-22 LAB — HEMOGLOBIN A1C: Hgb A1c MFr Bld: 5.6 % (ref 4.6–6.5)

## 2022-02-23 DIAGNOSIS — I890 Lymphedema, not elsewhere classified: Secondary | ICD-10-CM | POA: Diagnosis not present

## 2022-02-26 ENCOUNTER — Encounter: Payer: Self-pay | Admitting: Family Medicine

## 2022-02-27 DIAGNOSIS — M9903 Segmental and somatic dysfunction of lumbar region: Secondary | ICD-10-CM | POA: Diagnosis not present

## 2022-02-27 DIAGNOSIS — M9901 Segmental and somatic dysfunction of cervical region: Secondary | ICD-10-CM | POA: Diagnosis not present

## 2022-02-27 DIAGNOSIS — M62830 Muscle spasm of back: Secondary | ICD-10-CM | POA: Diagnosis not present

## 2022-02-27 DIAGNOSIS — M5033 Other cervical disc degeneration, cervicothoracic region: Secondary | ICD-10-CM | POA: Diagnosis not present

## 2022-03-03 ENCOUNTER — Other Ambulatory Visit: Payer: Self-pay | Admitting: Pulmonary Disease

## 2022-03-04 NOTE — Progress Notes (Deleted)
MRN : [id number]  Kimberly Patton is a 66 y.o. (1956-01-07) female who presents with chief complaint of varicose veins hurt.  History of Present Illness: ***  No outpatient medications have been marked as taking for the 03/05/22 encounter (Appointment) with Delana Meyer, Dolores Lory, MD.    Past Medical History:  Diagnosis Date   ALLERGIC RHINITIS 04/03/2007   Allergy    Not sure   Anemia    ((Pt Qnr Sub: Denies at visit from 08/28/2021 with endocrinology))    Anxiety    Arthritis Not sure   Asthma    hx of years ago - no longer a problem    ASTHMA, PERSISTENT, MODERATE 04/03/2007   Cancer ([place])    skin    Dyspnea on exertion    a. 11/2007 Echo: EF 60%.   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 11/2011 48h Holter: RSR, rare PVC's, occas PAC's   Retinal tear of right eye 01/2016   Ventral hernia     Past Surgical History:  Procedure Laterality Date   [place]   BIOPSY THYROID     08/2021   BREAST CYST EXCISION Right 1994   Benign   CESAREAN SECTION     x2   CHOLECYSTECTOMY  1995   COLONOSCOPY WITH PROPOFOL N/A 05/09/2021   Procedure: COLONOSCOPY WITH PROPOFOL;  Surgeon: [name], MD;  Location: [facility] ENDOSCOPY;  Service: Gastroenterology;  Laterality: N/A;   EYE SURGERY  01/2016   Repair retinal tear   FOOT SURGERY  1995 / 1996   x2   HERNIA REPAIR     KNEE ARTHROSCOPY W/ MENISCAL REPAIR  07/25/2013   right knee Dr. Mardelle Matte   LAPAROSCOPIC GASTRIC SLEEVE RESECTION N/A 06/08/2013   Procedure: LAPAROSCOPIC GASTRIC SLEEVE RESECTION AND EXCISION OF SEBACEUS CYST FROM MID  CHEST takedown of incarcerated ventral hernia and primary repair endoscopy;  Surgeon: [name], MD;  Location: [facility] ORS;  Service: General;  Laterality: N/A;   LAPAROSCOPIC NISSEN FUNDOPLICATION N/A 81/44/8185   Procedure: LAPAROSCOPIC REPAIR LARGE SYMPTOMATIC HIATAL HERNIA WITH UPPER ENDOSCOPY;  Surgeon: Johnathan Hausen, MD;  Location: [facility] OR[facility];  Service: General;  Laterality: N/A;   moles  06/2013   removed 2 moles from under arm and  lowback   NECK SURGERY     occipital nerve damage- injections    OVARIAN CYST [place] EXCISIONAL BREAST BIOPSY Right 06/23/2021   Procedure: RADIOACTIVE SEED GUIDED EXCISIONAL RIGHT BREAST BIOPSY;  Surgeon: Johnathan Hausen, MD;  Location: [place];  Service: General;  Laterality: Right;   RETINAL TEAR REPAIR CRYOTHERAPY Right 01/2016   [place]   SKIN CANCER EXCISION     TONSILLECTOMY     TUBAL LIGATION  Think 1994   UPPER GI ENDOSCOPY  06/08/2013   Procedure: UPPER GI ENDOSCOPY;  Surgeon: [name], MD;  Location: [facility];  Service: General;;  US ECHOCARDIOGRAPHY  12/2011   WNL - EF 55-60%, mild MR, grade 1 diastolic dysnfiction (mild)   VENTRAL HERNIA REPAIR  2000    Social History Social History   Tobacco Use   Smoking status: Never   Smokeless tobacco: Never   Tobacco comments:    smoke at age 54-12  Vaping Use   Vaping Use: Never used  Substance Use Topics   Alcohol use: Not Currently   Drug use: No    Family History Family History  Problem Relation Age of Onset   Breast cancer Mother    Diabetes Mother    Hypertension Mother    Kidney failure Mother    Diabetes Father    Hypertension Father    Diabetes Other    Hypertension Other    Stroke Other    Hemophilia Other     Allergies  Allergen Reactions   Wasp Venom Anaphylaxis   Chlorhexidine Gluconate     Itching under breasts   Metronidazole Other (See Comments)    Chest tightness,  neck tightness   Prednisone Other (See Comments)    Severe migraine / with taper dose   Sodium Hypochlorite Rash    Liquid clorox bleach     REVIEW OF SYSTEMS (Negative unless checked)  Constitutional: '[]'$ Weight loss  '[]'$ Fever  '[]'$ Chills Cardiac: '[]'$ Chest pain   '[]'$ Chest pressure   '[]'$ Palpitations   '[]'$ Shortness of breath when laying flat   '[]'$ Shortness of breath with exertion. Vascular:  '[]'$ Pain in legs with walking   '[x]'$ Pain in legs with standing  '[]'$ History of DVT   '[]'$ Phlebitis   '[]'$ Swelling in legs   '[x]'$ Varicose veins   '[]'$ Non-healing ulcers Pulmonary:   '[]'$ Uses home oxygen   '[]'$ Productive cough   '[]'$ Hemoptysis   '[]'$ Wheeze  '[]'$ COPD   '[]'$ Asthma Neurologic:  '[]'$ Dizziness   '[]'$ Seizures   '[]'$ History of stroke   '[]'$ History of TIA  '[]'$ Aphasia   '[]'$ Vissual changes   '[]'$ Weakness or numbness in arm   '[]'$ Weakness or numbness in leg Musculoskeletal:   '[]'$ Joint swelling   '[]'$ Joint pain   '[]'$ Low back pain Hematologic:  '[]'$ Easy bruising  '[]'$ Easy bleeding   '[]'$ Hypercoagulable state   '[]'$ Anemic Gastrointestinal:  '[]'$ Diarrhea   '[]'$ Vomiting  '[]'$ Gastroesophageal reflux/heartburn   '[]'$ Difficulty swallowing. Genitourinary:  '[]'$ Chronic kidney disease   '[]'$ Difficult urination  '[]'$ Frequent urination   '[]'$ Blood in urine Skin:  '[]'$ Rashes   '[]'$ Ulcers  Psychological:  '[]'$ History of anxiety   '[]'$  History of major depression.  Physical Examination  There were no vitals filed for this visit. There is no height or weight on file to calculate BMI. Gen: WD/WN, NAD Head: [place]/AT, No temporalis wasting.  Ear/Nose/Throat: Hearing grossly intact, nares w/o erythema or drainage, pinna without lesions Eyes: PER, EOMI, sclera nonicteric.  Neck: Supple, no gross masses.  No JVD.  Pulmonary:  Good air movement, no audible wheezing, no use of accessory muscles.  Cardiac: RRR, precordium not hyperdynamic. Vascular:  Large varicosities present, greater than 10 mm ***.  Veins are tender to palpation  Moderate venous stasis changes to the legs bilaterally.  Trace soft  pitting edema  Vessel Right Left  Radial Palpable Palpable  Gastrointestinal: soft, non-distended. No guarding/no peritoneal signs.  Musculoskeletal: M/S 5/5 throughout.  No deformity.  Neurologic: CN 2-12 intact. Pain and light touch intact in extremities.  Symmetrical.  Speech is fluent. Motor exam as listed above. Psychiatric: Judgment intact, Mood & affect appropriate for pt's clinical situation. Dermatologic: Venous rashes no ulcers noted.  No changes consistent with cellulitis. Lymph :  No lichenification or skin changes of chronic lymphedema.  CBC Lab Results  Component Value Date   WBC 7.8 02/20/2022   HGB 12.1 02/20/2022   HCT 37.9 02/20/2022   MCV 82.6 02/20/2022   PLT 323 02/20/2022    BMET    Component Value Date/Time   NA 139 09/21/2021 1222   NA 139 01/21/2020 1137   K 4.1 09/21/2021 1222   CL 102 09/21/2021 1222   CO2 29 09/21/2021 1222   GLUCOSE 89 02/22/2022 0815   BUN 19 09/21/2021 1222   BUN 16 01/21/2020 1137   CREATININE 0.50 09/21/2021 1222   CREATININE 0.57 01/01/2020 1623   CALCIUM 9.5 09/21/2021 1222   GFRNONAA >60 09/21/2021 1222   GFRAA >60 02/02/2020 1417   CrCl cannot be calculated (Patient's most recent lab result is older than the maximum 21 days allowed.).  COAG Lab Results  Component Value Date   INR 1.0 12/03/2007   INR 1.0 11/25/2007    Radiology US Carotid Bilateral  Result Date: 02/22/2022 CLINICAL DATA:  Transient visual loss of right eye EXAM: BILATERAL CAROTID DUPLEX ULTRASOUND TECHNIQUE: Pearline Cables scale imaging, color Doppler and duplex ultrasound were performed of bilateral carotid and vertebral arteries in the neck. COMPARISON:  None Available. FINDINGS: Criteria: Quantification of carotid stenosis is based on velocity parameters that correlate the residual internal carotid diameter with NASCET-based stenosis levels, using the diameter of the distal internal carotid lumen as the denominator for stenosis measurement. The following  velocity measurements were obtained: RIGHT ICA: 89/30 cm/sec CCA: 17/79 cm/sec SYSTOLIC ICA/CCA RATIO:  1.0 ECA: 78 cm/sec LEFT ICA: 93/19 cm/sec CCA: 390/30 cm/sec SYSTOLIC ICA/CCA RATIO:  0.9 ECA: 79 cm/sec RIGHT CAROTID ARTERY: No significant atherosclerotic plaque. No stenosis by Doppler criteria. Normal low resistance waveforms of the internal carotid artery. RIGHT VERTEBRAL ARTERY: Antegrade flow. Dampened arterial waveforms appears artifactual. LEFT CAROTID ARTERY: Minimal atherosclerotic plaque at the origin of the internal carotid artery. This results in less than 50% stenosis by Doppler criteria. Normal low resistance waveforms in the internal carotid artery. LEFT VERTEBRAL ARTERY:  Antegrade flow IMPRESSION: 1. No significant atherosclerotic plaque of the right carotid artery. No stenosis by Doppler criteria. 2. Minimal atherosclerotic plaque at the origin of the left internal carotid artery results in less than 50% stenosis by Doppler criteria. 3. Bilateral vertebral arteries demonstrate antegrade flow. Dampened appearance of arterial waveforms in the right vertebral artery is favored artifactual. If this could be of clinical concern, consider further evaluation with CTA neck. Electronically Signed   By: Albin Felling M.D.   On: 02/22/2022 11:56     Assessment/Plan 1. Varicose veins with inflammation ***  2. Chronic venous insufficiency ***  3. Coronary artery disease of native artery of native heart with stable angina pectoris (HCC) ***  4. Primary hypertension ***    Hortencia Pilar, MD  03/04/2022 7:50 PM

## 2022-03-05 ENCOUNTER — Ambulatory Visit (INDEPENDENT_AMBULATORY_CARE_PROVIDER_SITE_OTHER): Payer: Medicare Other | Admitting: Vascular Surgery

## 2022-03-07 DIAGNOSIS — H5712 Ocular pain, left eye: Secondary | ICD-10-CM | POA: Diagnosis not present

## 2022-03-08 ENCOUNTER — Telehealth (INDEPENDENT_AMBULATORY_CARE_PROVIDER_SITE_OTHER): Payer: Self-pay | Admitting: Nurse Practitioner

## 2022-03-08 NOTE — Telephone Encounter (Signed)
Spoke to pt to try and set up sclerotherapy appts. She stated that she was at another MD office and would call us back to schedule.   She will need: 3 X bilateral SALINE sclero Josem Kaufmann #[id number] exp: 10.2.23 - 12.31.23. SEE FB. - 4 weeks apart.

## 2022-03-09 ENCOUNTER — Encounter: Payer: Self-pay | Admitting: Family Medicine

## 2022-03-09 ENCOUNTER — Ambulatory Visit (INDEPENDENT_AMBULATORY_CARE_PROVIDER_SITE_OTHER): Payer: Medicare Other | Admitting: Family Medicine

## 2022-03-09 ENCOUNTER — Ambulatory Visit
Admission: RE | Admit: 2022-03-09 | Discharge: 2022-03-09 | Disposition: A | Discharge status: Home / Self Care | Payer: Medicare Other | Source: Ambulatory Visit | Attending: Family Medicine | Admitting: Family Medicine

## 2022-03-09 ENCOUNTER — Telehealth (INDEPENDENT_AMBULATORY_CARE_PROVIDER_SITE_OTHER): Payer: Self-pay | Admitting: Nurse Practitioner

## 2022-03-09 VITALS — BP 106/60 | HR 71 | Temp 97.9°F | Ht 66.0 in | Wt 277.0 lb

## 2022-03-09 DIAGNOSIS — M316 Other giant cell arteritis: Secondary | ICD-10-CM

## 2022-03-09 DIAGNOSIS — D329 Benign neoplasm of meninges, unspecified: Secondary | ICD-10-CM

## 2022-03-09 DIAGNOSIS — E236 Other disorders of pituitary gland: Secondary | ICD-10-CM

## 2022-03-09 DIAGNOSIS — R519 Headache, unspecified: Secondary | ICD-10-CM | POA: Insufficient documentation

## 2022-03-09 LAB — SEDIMENTATION RATE: Sed Rate: 24 mm/hr (ref 0–30)

## 2022-03-09 MED ORDER — PREDNISONE 20 MG PO TABS: 40.0000 mg | ORAL_TABLET | Freq: Every day | ORAL | 0 refills | Status: DC

## 2022-03-09 MED ORDER — GADOPICLENOL 0.5 MMOL/ML IV SOLN
10.0000 mL | Freq: Once | INTRAVENOUS | Status: AC | PRN
  Administered 2022-03-09: 10 mL via INTRAVENOUS

## 2022-03-09 NOTE — Progress Notes (Signed)
Patient ID: Kimberly Patton, female    DOB: January 21, 1956, 66 y.o.   MRN: [id number]  This visit was conducted in person.  BP 106/60   Pulse 71   Temp 97.9 F (36.6 C) (Oral)   Ht '5\' 6"'$  (1.676 m)   Wt 277 lb (125.6 kg) Comment: Patient reported  SpO2 98%   BMI 44.71 kg/m    CC:  Chief Complaint  Patient presents with   Headache    On left side     Subjective:   HPI: Kimberly Patton is a 66 y.o. female with history of meningioma and subcutaneous cyst on left scalp presenting on 03/09/2022 for Headache (On left side )  Reviewed office visit note for similar issue on February 15, 2022 She has noted months of pressure in left head.. had thought possibly due to  prolia SE but now worsening. She reports she is having left sided stabbing pain in her head over site where a cyst was removed and has scar.    No new visual changes except in right eye has inflammation  and floaters per eye MD.  No new neuro changes.  No nausea.    No congestion, no allergy symptoms     Last imaging of meningioma  was brain MRI 06/02/20 IMPRESSION: 1. No abnormality seen to explain the presenting symptoms. No vestibular schwannoma. Brain parenchyma itself is normal. 2. 2 cm meningioma at the left frontoparietal vertex. Slight indentation of the brain but no mass effect or edema. No evidence of calvarial invasion.     2017 : IMPRESSION: 1. 1.9 x 1.8 x 1.7 cm meningioma at the left frontoparietal convexity. No associated edema. 2. Otherwise normal brain MRI for patient age. 3. 2.6 x 1.8 cm lobulated nonenhancing lesion at the left frontoparietal scalp, suspected to reflect a sebaceous cyst. Correlation with physical exam recommended.   Today she reports continued headache on the left side, worsening  to severe in last few weeks.  This continues to be separate from chronic scalp tenderness she has at the site of removal of sebaceous cyst. She continues to report no new visual changes, no new  neuro changes, no nausea vomiting.  No fever.  No sinus symptoms.  She does have history of cervical OA and resulting cervicogenic headache.  Recently had a flare. Treated with chiropractor and prednisone taper.  Had some  watery eye after starting the prednisone taper... had eye evaluation.  Pain in neck has resolved but still pain and pressure in left head.    Points to pain over left temple , throbbing in temple.  Concerning for temporal arthritis  She has some new hip stiffness.  60, 50, 40, 30, 20, 10 mg taper of prednisone. She is very shaky and anxious in office today.     She is using Advil dual  Relevant past medical, surgical, family and social history reviewed and updated as indicated. Interim medical history since our last visit reviewed. Allergies and medications reviewed and updated. Outpatient Medications Prior to Visit  Medication Sig Dispense Refill   albuterol (VENTOLIN HFA) 108 (90 Base) MCG/ACT inhaler Inhale 2 puffs into the lungs every 6 (six) hours as needed for wheezing or shortness of breath. 8 g 2   budesonide-formoterol (SYMBICORT) 160-4.5 MCG/ACT inhaler Inhale 2 puffs into the lungs 2 (two) times daily. 1 each 5   Calcium Citrate-Vitamin D (CITRACAL + D PO) Take 2 tablets by mouth daily.      Cholecalciferol (VITAMIN D3)  1.25 MG (50000 UT) CAPS Take 1 capsule by mouth once a week. 12 capsule 3   Coenzyme Q10-Vitamin E (QUNOL ULTRA COQ10) 100-150 MG-UNIT CAPS Take 100 mg by mouth daily.     COLLAGEN PO Take 2,000 mg by mouth daily.     Cyanocobalamin (VITAMIN B-12) 5000 MCG SUBL Place 5,000 mcg under the tongue daily.     denosumab (PROLIA) 60 MG/ML SOSY injection      diltiazem (CARDIZEM SR) 120 MG 12 hr capsule Take 1 capsule (120 mg total) by mouth 2 (two) times daily. 60 capsule 9   ELDERBERRY PO Take 1,000 mg by mouth daily.      Emollient (COLLAGEN PREMIUM SKIN EX) Apply topically.     famotidine (PEPCID) 40 MG tablet Take 40 mg by mouth at bedtime.      fluticasone (FLONASE) 50 MCG/ACT nasal spray SPRAY 2 SPRAYS INTO EACH NOSTRIL EVERY DAY 48 mL 1   gabapentin (NEURONTIN) 300 MG capsule Take 300 mg by mouth daily.     Lactobacillus Rhamnosus, GG, (CULTURELLE IMMUNITY SUPPORT PO) Take 1 capsule by mouth daily.     Menaquinone-7 (VITAMIN K2 PO) Take 1 tablet by mouth daily.     montelukast (SINGULAIR) 10 MG tablet TAKE ONE TABLET BY MOUTH AT BEDTIME 30 tablet 1   Multiple Vitamins-Minerals (CENTRUM SILVER 50+WOMEN PO) Take 1 tablet by mouth daily.     nitroGLYCERIN (NITROSTAT) 0.4 MG SL tablet Place 0.4 mg under the tongue every 5 (five) minutes as needed for chest pain.     Omega-3 Fatty Acids (FISH OIL) 1200 MG CAPS Take 1 capsule by mouth daily.     pantoprazole (PROTONIX) 40 MG tablet TAKE 1 TABLET BY MOUTH EVERY DAY 90 tablet 1   phenazopyridine (PYRIDIUM) 200 MG tablet Take 1 tablet (200 mg total) by mouth 3 (three) times daily as needed for pain. 15 tablet 0   predniSONE (STERAPRED UNI-PAK 21 TAB) 10 MG (21) TBPK tablet Take by mouth.     Riboflavin 100 MG CAPS Take 1 capsule by mouth daily.     rosuvastatin (CRESTOR) 10 MG tablet Take by mouth.     traMADol (ULTRAM) 50 MG tablet Take 50 mg by mouth every 6 (six) hours as needed.     TURMERIC PO Take 1 tablet by mouth daily.     No facility-administered medications prior to visit.     Per HPI unless specifically indicated in ROS section below Review of Systems  Constitutional:  Negative for fatigue and fever.  HENT:  Negative for congestion.   Eyes:  Negative for pain.  Respiratory:  Negative for cough and shortness of breath.   Cardiovascular:  Negative for chest pain, palpitations and leg swelling.  Gastrointestinal:  Negative for abdominal pain.  Genitourinary:  Negative for dysuria and vaginal bleeding.  Musculoskeletal:  Positive for neck pain. Negative for back pain.       Hip pain bialterally  Neurological:  Positive for tremors and headaches. Negative for syncope and  light-headedness.  Psychiatric/Behavioral:  Positive for agitation and sleep disturbance. Negative for dysphoric mood. The patient is nervous/anxious.    Objective:  BP 106/60   Pulse 71   Temp 97.9 F (36.6 C) (Oral)   Ht '5\' 6"'$  (1.676 m)   Wt 277 lb (125.6 kg) Comment: Patient reported  SpO2 98%   BMI 44.71 kg/m   Wt Readings from Last 3 Encounters:  03/09/22 277 lb (125.6 kg)  02/15/22 277 lb (125.6 kg)  02/05/22  286 lb (129.7 kg)      Physical Exam Vitals and nursing note reviewed.  Constitutional:      General: She is not in acute distress.    Appearance: Normal appearance. She is well-developed. She is obese. She is not ill-appearing or toxic-appearing.  HENT:     Head: Normocephalic.     Right Ear: Hearing, tympanic membrane, ear canal and external ear normal.     Left Ear: Hearing, tympanic membrane, ear canal and external ear normal.     Nose: Nose normal.  Eyes:     General: Lids are normal. Lids are everted, no foreign bodies appreciated.     Conjunctiva/sclera: Conjunctivae normal.     Pupils: Pupils are equal, round, and reactive to light.  Neck:     Thyroid: No thyroid mass or thyromegaly.     Vascular: No carotid bruit.     Trachea: Trachea normal.  Cardiovascular:     Rate and Rhythm: Normal rate and regular rhythm.     Heart sounds: Normal heart sounds, S1 normal and S2 normal. No murmur heard.    No gallop.  Pulmonary:     Effort: Pulmonary effort is normal. No respiratory distress.     Breath sounds: Normal breath sounds. No wheezing, rhonchi or rales.  Abdominal:     General: Bowel sounds are normal. There is no distension or abdominal bruit.     Palpations: Abdomen is soft. There is no fluid wave or mass.     Tenderness: There is no abdominal tenderness. There is no guarding or rebound.     Hernia: No hernia is present.  Musculoskeletal:     Cervical back: Normal range of motion and neck supple.  Lymphadenopathy:     Cervical: No cervical  adenopathy.  Skin:    General: Skin is warm and dry.     Findings: No rash.  Neurological:     Mental Status: She is alert.     Cranial Nerves: Cranial nerves 2-12 are intact. No cranial nerve deficit.     Sensory: Sensation is intact. No sensory deficit.     Motor: Motor function is intact.     Comments: Ttp over left scalp and left temple  Psychiatric:        Mood and Affect: Mood is not anxious or depressed.        Speech: Speech normal.        Behavior: Behavior normal. Behavior is cooperative.        Judgment: Judgment normal.       Results for orders placed or performed in visit on 02/22/22  Glucose, Random  Result Value Ref Range   Glucose, Bld 89 70 - 99 mg/dL  Hemoglobin A1c  Result Value Ref Range   Hgb A1c MFr Bld 5.6 4.6 - 6.5 %     COVID 19 screen:  No recent travel or known exposure to COVID19 The patient denies respiratory symptoms of COVID 19 at this time. The importance of social distancing was discussed today.   Assessment and Plan  Hold off on flu shot given other issues today.   Problem List Items Addressed This Visit     Meningioma [facility])   Relevant Orders   MR Brain W Wo Contrast   Severe headache - Primary   Relevant Orders   Ambulatory referral to Vascular Surgery   Sedimentation rate   MR Brain W Wo Contrast   Severe progressive headache now located over left temple concerning for temporal  arteritis.  Of note she has a meningioma fairly large, left frontoparietal that was last evaluated in 2021. She also has cervical radiculopathy and possible cervicogenic component to her headache.  She has had some improvement in her cervical neck pain with the current prednisone taper she is on but minimal improvement with her temporal head pain.  We will repeat stat MRI brain with and without contrast to reevaluate for change in size of the meningioma as possible cause of the symptoms given severity of head pain that is progressive and now intractable. I  will also check a sed rate and place her on 40 mg of prednisone daily for minimum of 2 weeks to cover for the possibility of temporal arteritis.  We will move forward with setting up a vascular referral for more definitive possible diagnosis of temporal arteritis if MRI is unremarkable.  Meds ordered this encounter  Medications   predniSONE (DELTASONE) 20 MG tablet    Sig: Take 2 tablets (40 mg total) by mouth daily with breakfast.    Dispense:  30 tablet    Refill:  0   Orders Placed This Encounter  Procedures   MR Brain W Wo Contrast    Standing Status:   Future    Standing Expiration Date:   03/10/2023    Order Specific Question:   If indicated for the ordered procedure, I authorize the administration of contrast media per Radiology protocol    Answer:   Yes    Order Specific Question:   What is the patient's sedation requirement?    Answer:   No Sedation    Order Specific Question:   Does the patient have a pacemaker or implanted devices?    Answer:   No    Order Specific Question:   Preferred imaging location?    Answer:   [facility] (table limit - 550lbs)   Sedimentation rate   Ambulatory referral to Vascular Surgery    Referral Priority:   Urgent    Referral Type:   Surgical    Referral Reason:   Specialty Services Required    Requested Specialty:   Vascular Surgery    Number of Visits Requested:   1       [name], MD

## 2022-03-09 NOTE — Patient Instructions (Signed)
We will move forward with MRI to evaluate intractable severe head pain in the setting of meningioma. Stop at the lab on the way out for repeat sed rate testing. Start prednisone 40 mg daily for minimum 2 weeks to cover possibility of temporal arteritis. We will start working on setting up a vascular referral for consideration of temporal arteritis possibility

## 2022-03-12 DIAGNOSIS — H43813 Vitreous degeneration, bilateral: Secondary | ICD-10-CM | POA: Diagnosis not present

## 2022-03-13 NOTE — Telephone Encounter (Signed)
errir 

## 2022-03-14 ENCOUNTER — Other Ambulatory Visit (INDEPENDENT_AMBULATORY_CARE_PROVIDER_SITE_OTHER): Payer: Medicare Other

## 2022-03-14 ENCOUNTER — Telehealth (INDEPENDENT_AMBULATORY_CARE_PROVIDER_SITE_OTHER): Payer: Self-pay | Admitting: Vascular Surgery

## 2022-03-14 DIAGNOSIS — E236 Other disorders of pituitary gland: Secondary | ICD-10-CM

## 2022-03-14 NOTE — Telephone Encounter (Signed)
It appears she has been started on prednisone which is the appropriate treatment.  We can likely get her in Monday to see myself or Dr. Delana Meyer

## 2022-03-14 NOTE — Telephone Encounter (Signed)
Pt. Called and stated she wanted to come in and see the doctor regarding severe headaches and temper tenderness.  She states she is very nervous and was told she could go blind.  She states she don't mind coming in and sitting here all day to see a provider.  She wants to come in today.  Please advise

## 2022-03-15 LAB — VITAMIN D 25 HYDROXY (VIT D DEFICIENCY, FRACTURES): VITD: 54.4 ng/mL (ref 30.00–100.00)

## 2022-03-15 LAB — PTH, INTACT AND CALCIUM
Calcium: 9.6 mg/dL (ref 8.6–10.4)
PTH: 62 pg/mL (ref 16–77)

## 2022-03-19 ENCOUNTER — Ambulatory Visit (INDEPENDENT_AMBULATORY_CARE_PROVIDER_SITE_OTHER): Payer: Medicare Other | Admitting: Vascular Surgery

## 2022-03-19 ENCOUNTER — Encounter (INDEPENDENT_AMBULATORY_CARE_PROVIDER_SITE_OTHER): Payer: Self-pay | Admitting: Vascular Surgery

## 2022-03-19 VITALS — BP 135/83 | HR 75 | Resp 16 | Wt 286.0 lb

## 2022-03-19 DIAGNOSIS — I872 Venous insufficiency (chronic) (peripheral): Secondary | ICD-10-CM

## 2022-03-19 DIAGNOSIS — E7800 Pure hypercholesterolemia, unspecified: Secondary | ICD-10-CM

## 2022-03-19 DIAGNOSIS — H20021 Recurrent acute iridocyclitis, right eye: Secondary | ICD-10-CM | POA: Diagnosis not present

## 2022-03-19 DIAGNOSIS — R519 Headache, unspecified: Secondary | ICD-10-CM

## 2022-03-19 DIAGNOSIS — I10 Essential (primary) hypertension: Secondary | ICD-10-CM

## 2022-03-19 DIAGNOSIS — I25118 Atherosclerotic heart disease of native coronary artery with other forms of angina pectoris: Secondary | ICD-10-CM | POA: Diagnosis not present

## 2022-03-19 NOTE — H&P (View-Only) (Signed)
MRN : [id number]  Kimberly Patton is a 66 y.o. (23-Sep-1955) female who presents with chief complaint of legs hurt and swell.  History of Present Illness:   Patient is sent for evaluation of headache and the possible need for temporal artery biopsy.  Patient notes the headache is located both temples The patient describes the headache as pain which has become more of a pressure The patient notes the headache is very severe saying that it is always greater than 8 out of 10 on the pain scale. The duration of the headache has been for more than 12 week. The patient notes timing of the headache is occurring on a daily basis frequently it waxes and wanes and can become quite intense more than once a day. There do not appear to be any aggravating symptoms. The patient notes rest and laying down helps a little Tylenol has not been effective.  Otherwise they have not been able to obtain relief. Patient does not have a history of migraine/occipital headaches. The patient notes that the headache is not associated with visual changes   Current Meds  Medication Sig   albuterol (VENTOLIN HFA) 108 (90 Base) MCG/ACT inhaler Inhale 2 puffs into the lungs every 6 (six) hours as needed for wheezing or shortness of breath.   budesonide-formoterol (SYMBICORT) 160-4.5 MCG/ACT inhaler Inhale 2 puffs into the lungs 2 (two) times daily.   Calcium Citrate-Vitamin D (CITRACAL + D PO) Take 2 tablets by mouth daily.    Cholecalciferol (VITAMIN D3) 1.25 MG (50000 UT) CAPS Take 1 capsule by mouth once a week.   Coenzyme Q10-Vitamin E (QUNOL ULTRA COQ10) 100-150 MG-UNIT CAPS Take 100 mg by mouth daily.   COLLAGEN PO Take 2,000 mg by mouth daily.   Cyanocobalamin (VITAMIN B-12) 5000 MCG SUBL Place 5,000 mcg under the tongue daily.   denosumab (PROLIA) 60 MG/ML SOSY injection    diltiazem (CARDIZEM SR) 120 MG 12 hr capsule Take 1 capsule (120 mg total) by mouth 2 (two) times daily.   ELDERBERRY PO Take  1,000 mg by mouth daily.    Emollient (COLLAGEN PREMIUM SKIN EX) Apply topically.   famotidine (PEPCID) 40 MG tablet Take 40 mg by mouth at bedtime.   fluticasone (FLONASE) 50 MCG/ACT nasal spray SPRAY 2 SPRAYS INTO EACH NOSTRIL EVERY DAY   gabapentin (NEURONTIN) 300 MG capsule Take 300 mg by mouth daily.   Lactobacillus Rhamnosus, GG, (CULTURELLE IMMUNITY SUPPORT PO) Take 1 capsule by mouth daily.   Menaquinone-7 (VITAMIN K2 PO) Take 1 tablet by mouth daily.   montelukast (SINGULAIR) 10 MG tablet TAKE ONE TABLET BY MOUTH AT BEDTIME   Multiple Vitamins-Minerals (CENTRUM SILVER 50+WOMEN PO) Take 1 tablet by mouth daily.   nitroGLYCERIN (NITROSTAT) 0.4 MG SL tablet Place 0.4 mg under the tongue every 5 (five) minutes as needed for chest pain.   Omega-3 Fatty Acids (FISH OIL) 1200 MG CAPS Take 1 capsule by mouth daily.   pantoprazole (PROTONIX) 40 MG tablet TAKE 1 TABLET BY MOUTH EVERY DAY   phenazopyridine (PYRIDIUM) 200 MG tablet Take 1 tablet (200 mg total) by mouth 3 (three) times daily as needed for pain.   predniSONE (DELTASONE) 20 MG tablet Take 2 tablets (40 mg total) by mouth daily with breakfast.   Riboflavin 100 MG CAPS Take 1 capsule by mouth daily.   rosuvastatin (CRESTOR) 10 MG tablet Take by mouth.   traMADol (ULTRAM) 50 MG tablet Take 50 mg  by mouth every 6 (six) hours as needed.   TURMERIC PO Take 1 tablet by mouth daily.    Past Medical History:  Diagnosis Date   ALLERGIC RHINITIS 04/03/2007   Allergy    Not sure   Anemia    ((Pt Qnr Sub: Denies at visit from 08/28/2021 with endocrinology))    Anxiety    Arthritis Not sure   Asthma    hx of years ago - no longer a problem    ASTHMA, PERSISTENT, MODERATE 04/03/2007   Cancer ([place])    skin    Dyspnea on exertion    a. 11/2007 Echo: EF 60%.   Dysrhythmia    hx of heart arrhythmia 10-15 years ago - followed by DR Lovena Le - not seen in years    GERD (gastroesophageal reflux disease)    Hemophilia carrier    Hypertension     Joint pain    HIPS / LEGS   Knee injury    RT   Meningioma ([place])    Morbid obesity ([place])    Neuromuscular disorder ([place]) Not sure, since my fall   Paroxysmal SVT (supraventricular tachycardia)    a. 11/2011 48h Holter: RSR, rare PVC's, occas PAC's   Retinal tear of right eye 01/2016   Ventral hernia     Past Surgical History:  Procedure Laterality Date   [place]   BIOPSY THYROID     08/2021   BREAST CYST EXCISION Right 1994   Benign   CESAREAN SECTION     x2   CHOLECYSTECTOMY  1995   COLONOSCOPY WITH PROPOFOL N/A 05/09/2021   Procedure: COLONOSCOPY WITH PROPOFOL;  Surgeon: [name], MD;  Location: [facility] ENDOSCOPY;  Service: Gastroenterology;  Laterality: N/A;   EYE SURGERY  01/2016   Repair retinal tear   FOOT SURGERY  1995 / 1996   x2   HERNIA REPAIR     KNEE ARTHROSCOPY W/ MENISCAL REPAIR  07/25/2013   right knee Dr. Mardelle Matte   LAPAROSCOPIC GASTRIC SLEEVE RESECTION N/A 06/08/2013   Procedure: LAPAROSCOPIC GASTRIC SLEEVE RESECTION AND EXCISION OF SEBACEUS CYST FROM MID CHEST takedown of incarcerated ventral hernia and primary repair endoscopy;  Surgeon: [name], MD;  Location: [facility] ORS;  Service: General;  Laterality: N/A;   LAPAROSCOPIC NISSEN FUNDOPLICATION N/A [id number]   Procedure: LAPAROSCOPIC REPAIR LARGE SYMPTOMATIC HIATAL HERNIA WITH UPPER ENDOSCOPY;  Surgeon: Johnathan Hausen, MD;  Location: [facility];  Service: General;  Laterality: N/A;   moles  06/2013   removed 2 moles from under arm and  lowback   NECK SURGERY     occipital nerve damage- injections    OVARIAN CYST [place] EXCISIONAL BREAST BIOPSY Right 06/23/2021   Procedure: RADIOACTIVE SEED GUIDED EXCISIONAL RIGHT BREAST BIOPSY;  Surgeon: Johnathan Hausen, MD;  Location: [place];  Service: General;  Laterality: Right;   RETINAL TEAR REPAIR CRYOTHERAPY Right 01/2016    [place]   SKIN CANCER EXCISION     TONSILLECTOMY     TUBAL LIGATION  Think 1994   UPPER GI ENDOSCOPY  06/08/2013   Procedure: UPPER GI ENDOSCOPY;  Surgeon: [name], MD;  Location: [facility];  Service: General;;   US ECHOCARDIOGRAPHY  12/2011   WNL - EF 55-60%, mild MR, grade 1 diastolic dysnfiction (mild)   [place]    Social History Social History  Tobacco Use   Smoking status: Never   Smokeless tobacco: Never   Tobacco comments:    smoke at age 24-12  Vaping Use   Vaping Use: Never used  Substance Use Topics   Alcohol use: Not Currently   Drug use: No    Family History Family History  Problem Relation Age of Onset   Breast cancer Mother    Diabetes Mother    Hypertension Mother    Kidney failure Mother    Diabetes Father    Hypertension Father    Diabetes Other    Hypertension Other    Stroke Other    Hemophilia Other     Allergies  Allergen Reactions   Wasp Venom Anaphylaxis   Chlorhexidine Gluconate     Itching under breasts   Metronidazole Other (See Comments)    Chest tightness, neck tightness   Prednisone Other (See Comments)    Severe migraine / with taper dose   Sodium Hypochlorite Rash    Liquid clorox bleach     REVIEW OF SYSTEMS (Negative unless checked)  Constitutional: '[]'$ Weight loss  '[]'$ Fever  '[]'$ Chills Cardiac: '[]'$ Chest pain   '[]'$ Chest pressure   '[]'$ Palpitations   '[]'$ Shortness of breath when laying flat   '[]'$ Shortness of breath with exertion. Vascular:  '[]'$ Pain in legs with walking   '[x]'$ Pain in legs at rest  '[]'$ History of DVT   '[]'$ Phlebitis   '[x]'$ Swelling in legs   '[]'$ Varicose veins   '[]'$ Non-healing ulcers Pulmonary:   '[]'$ Uses home oxygen   '[]'$ Productive cough   '[]'$ Hemoptysis   '[]'$ Wheeze  '[]'$ COPD   '[]'$ Asthma Neurologic:  '[]'$ Dizziness   '[]'$ Seizures   '[]'$ History of stroke   '[]'$ History of TIA  '[]'$ Aphasia   '[]'$ Vissual changes   '[]'$ Weakness or numbness in arm   '[]'$ Weakness or numbness in leg Musculoskeletal:   '[]'$ Joint swelling   '[]'$ Joint pain    '[]'$ Low back pain Hematologic:  '[]'$ Easy bruising  '[]'$ Easy bleeding   '[]'$ Hypercoagulable state   '[]'$ Anemic Gastrointestinal:  '[]'$ Diarrhea   '[]'$ Vomiting  '[]'$ Gastroesophageal reflux/heartburn   '[]'$ Difficulty swallowing. Genitourinary:  '[]'$ Chronic kidney disease   '[]'$ Difficult urination  '[]'$ Frequent urination   '[]'$ Blood in urine Skin:  '[]'$ Rashes   '[]'$ Ulcers  Psychological:  '[]'$ History of anxiety   '[]'$  History of major depression.  Physical Examination  Vitals:   03/19/22 1432  BP: 135/83  Pulse: 75  Resp: 16  Weight: 286 lb (129.7 kg)   Body mass index is 46.16 kg/m. Gen: WD/WN, NAD Head: [place]/AT, No temporalis wasting.  Ear/Nose/Throat: Hearing grossly intact, nares w/o erythema or drainage, pinna without lesions Eyes: PER, EOMI, sclera nonicteric.  Neck: Supple, no gross masses.  No JVD.  Pulmonary:  Good air movement, no audible wheezing, no use of accessory muscles.  Cardiac: RRR, precordium not hyperdynamic. Vascular:  Palpable temporal pulses tender on the left.  scattered varicosities present bilaterally.  Moderate venous stasis changes to the legs bilaterally.  2+ soft pitting edema  Vessel Right Left  Radial Palpable Palpable  Gastrointestinal: soft, non-distended. No guarding/no peritoneal signs.  Musculoskeletal: M/S 5/5 throughout.  No deformity.  Neurologic: CN 2-12 intact. Pain and light touch intact in extremities.  Symmetrical.  Speech is fluent. Motor exam as listed above. Psychiatric: Judgment intact, Mood & affect appropriate for pt's clinical situation. Dermatologic: Venous rashes no ulcers noted.  No changes consistent with cellulitis. Lymph : No lichenification or skin changes of chronic lymphedema.  CBC Lab Results  Component Value Date   WBC 7.8 02/20/2022   HGB 12.1 02/20/2022  HCT 37.9 02/20/2022   MCV 82.6 02/20/2022   PLT 323 02/20/2022    BMET    Component Value Date/Time   NA 139 09/21/2021 1222   NA 139 01/21/2020 1137   K 4.1 09/21/2021 1222   CL 102  09/21/2021 1222   CO2 29 09/21/2021 1222   GLUCOSE 89 02/22/2022 0815   BUN 19 09/21/2021 1222   BUN 16 01/21/2020 1137   CREATININE 0.50 09/21/2021 1222   CREATININE 0.57 01/01/2020 1623   CALCIUM 9.6 03/14/2022 1434   GFRNONAA >60 09/21/2021 1222   GFRAA >60 02/02/2020 1417   CrCl cannot be calculated (Patient's most recent lab result is older than the maximum 21 days allowed.).  COAG Lab Results  Component Value Date   INR 1.0 12/03/2007   INR 1.0 11/25/2007    Radiology MR Brain W Wo Contrast  Result Date: 03/09/2022 CLINICAL DATA:  Headache, new or worsening (Age >= 50y) Brain/CNS neoplasm, monitor EXAM: MRI HEAD WITHOUT AND WITH CONTRAST TECHNIQUE: Multiplanar, multiecho pulse sequences of the brain and surrounding structures were obtained without and with intravenous contrast. CONTRAST:  10 mL VueWay IV COMPARISON:  MRI 06/02/2020. FINDINGS: Brain: Unchanged size/appearance of a 2.0 x 2.3 cm extra-axial dural-based enhancing mass along the high left frontal convexity at the vertex (series 18, image 137). Additional 5 mm focus of dural-based enhancement along the right frontal cortex (series 18, image 122) is also unchanged. No significant mass effect or brain edema. No evidence of acute infarct, acute hemorrhage, midline shift, or hydrocephalus. Partially empty sella. Vascular: Major arterial flow voids are maintained skull base. Skull and upper cervical spine: Normal marrow signal. Sinuses/Orbits: Clear sinuses.  No acute orbital findings. Other: No mastoid effusions. IMPRESSION: 1. No evidence of acute intracranial abnormality. 2. Unchanged size/appearance of a 2.3 cm putative meningioma along the high left frontal vertex. 3. Possible additional 6 mm meningioma along the right frontal convexity, also unchanged. 4. Partially empty sella, which is often a normal anatomic variant but can be associated with idiopathic intracranial hypertension. Electronically Signed   By: Margaretha Sheffield M.D.   On: 03/09/2022 17:07   US Carotid Bilateral  Result Date: 02/22/2022 CLINICAL DATA:  Transient visual loss of right eye EXAM: BILATERAL CAROTID DUPLEX ULTRASOUND TECHNIQUE: Pearline Cables scale imaging, color Doppler and duplex ultrasound were performed of bilateral carotid and vertebral arteries in the neck. COMPARISON:  None Available. FINDINGS: Criteria: Quantification of carotid stenosis is based on velocity parameters that correlate the residual internal carotid diameter with NASCET-based stenosis levels, using the diameter of the distal internal carotid lumen as the denominator for stenosis measurement. The following velocity measurements were obtained: RIGHT ICA: 89/30 cm/sec CCA: 75/17 cm/sec SYSTOLIC ICA/CCA RATIO:  1.0 ECA: 78 cm/sec LEFT ICA: 93/19 cm/sec CCA: 001/74 cm/sec SYSTOLIC ICA/CCA RATIO:  0.9 ECA: 79 cm/sec RIGHT CAROTID ARTERY: No significant atherosclerotic plaque. No stenosis by Doppler criteria. Normal low resistance waveforms of the internal carotid artery. RIGHT VERTEBRAL ARTERY: Antegrade flow. Dampened arterial waveforms appears artifactual. LEFT CAROTID ARTERY: Minimal atherosclerotic plaque at the origin of the internal carotid artery. This results in less than 50% stenosis by Doppler criteria. Normal low resistance waveforms in the internal carotid artery. LEFT VERTEBRAL ARTERY:  Antegrade flow IMPRESSION: 1. No significant atherosclerotic plaque of the right carotid artery. No stenosis by Doppler criteria. 2. Minimal atherosclerotic plaque at the origin of the left internal carotid artery results in less than 50% stenosis by Doppler criteria. 3. Bilateral vertebral arteries demonstrate antegrade  flow. Dampened appearance of arterial waveforms in the right vertebral artery is favored artifactual. If this could be of clinical concern, consider further evaluation with CTA neck. Electronically Signed   By: Albin Felling M.D.   On: 02/22/2022 11:56     Assessment/Plan 1.  Severe headache Recommend:  The patient has signs and symptoms that are suggestive of temporal arteritis  Patient should have a left temporal artery biopsy done.  The risks, benefits and alternative therapies were reviewed in detail with the patient.  All questions were answered.  The patient agrees to proceed with surgery.   The patient will follow up with me in the office after the surgery to review the pathology.   2. Chronic venous insufficiency Recommend:  No surgery or intervention at this point in time.    I have reviewed my discussion with the patient regarding lymphedema and why it  causes symptoms.  Patient will continue wearing graduated compression on a daily basis. The patient should put the compression on first thing in the morning and removing them in the evening. The patient should not sleep in the compression.   In addition, behavioral modification throughout the day will be continued.  This will include frequent elevation (such as in a recliner), use of over the counter pain medications as needed and exercise such as walking.  The systemic causes for chronic edema such as liver, kidney and cardiac etiologies does not appear to have significant changed over the past year.    The patient will continue aggressive use of the  lymph pump.  This will continue to improve the edema control and prevent sequela such as ulcers and infections.   The patient will follow-up with me on an annual basis.    3. Primary hypertension Continue antihypertensive medications as already ordered, these medications have been reviewed and there are no changes at this time.   4. Coronary artery disease of native artery of native heart with stable angina pectoris (HCC) Continue cardiac and antihypertensive medications as already ordered and reviewed, no changes at this time.  Continue statin as ordered and reviewed, no changes at this time  Nitrates PRN for chest pain   5.  Hypercholesteremia Continue statin as ordered and reviewed, no changes at this time     Hortencia Pilar, MD  03/19/2022 4:07 PM

## 2022-03-19 NOTE — Progress Notes (Signed)
MRN : [id number]  Kimberly Patton is a 66 y.o. (23-Sep-1955) female who presents with chief complaint of legs hurt and swell.  History of Present Illness:   Patient is sent for evaluation of headache and the possible need for temporal artery biopsy.  Patient notes the headache is located both temples The patient describes the headache as pain which has become more of a pressure The patient notes the headache is very severe saying that it is always greater than 8 out of 10 on the pain scale. The duration of the headache has been for more than 12 week. The patient notes timing of the headache is occurring on a daily basis frequently it waxes and wanes and can become quite intense more than once a day. There do not appear to be any aggravating symptoms. The patient notes rest and laying down helps a little Tylenol has not been effective.  Otherwise they have not been able to obtain relief. Patient does not have a history of migraine/occipital headaches. The patient notes that the headache is not associated with visual changes   Current Meds  Medication Sig   albuterol (VENTOLIN HFA) 108 (90 Base) MCG/ACT inhaler Inhale 2 puffs into the lungs every 6 (six) hours as needed for wheezing or shortness of breath.   budesonide-formoterol (SYMBICORT) 160-4.5 MCG/ACT inhaler Inhale 2 puffs into the lungs 2 (two) times daily.   Calcium Citrate-Vitamin D (CITRACAL + D PO) Take 2 tablets by mouth daily.    Cholecalciferol (VITAMIN D3) 1.25 MG (50000 UT) CAPS Take 1 capsule by mouth once a week.   Coenzyme Q10-Vitamin E (QUNOL ULTRA COQ10) 100-150 MG-UNIT CAPS Take 100 mg by mouth daily.   COLLAGEN PO Take 2,000 mg by mouth daily.   Cyanocobalamin (VITAMIN B-12) 5000 MCG SUBL Place 5,000 mcg under the tongue daily.   denosumab (PROLIA) 60 MG/ML SOSY injection    diltiazem (CARDIZEM SR) 120 MG 12 hr capsule Take 1 capsule (120 mg total) by mouth 2 (two) times daily.   ELDERBERRY PO Take  1,000 mg by mouth daily.    Emollient (COLLAGEN PREMIUM SKIN EX) Apply topically.   famotidine (PEPCID) 40 MG tablet Take 40 mg by mouth at bedtime.   fluticasone (FLONASE) 50 MCG/ACT nasal spray SPRAY 2 SPRAYS INTO EACH NOSTRIL EVERY DAY   gabapentin (NEURONTIN) 300 MG capsule Take 300 mg by mouth daily.   Lactobacillus Rhamnosus, GG, (CULTURELLE IMMUNITY SUPPORT PO) Take 1 capsule by mouth daily.   Menaquinone-7 (VITAMIN K2 PO) Take 1 tablet by mouth daily.   montelukast (SINGULAIR) 10 MG tablet TAKE ONE TABLET BY MOUTH AT BEDTIME   Multiple Vitamins-Minerals (CENTRUM SILVER 50+WOMEN PO) Take 1 tablet by mouth daily.   nitroGLYCERIN (NITROSTAT) 0.4 MG SL tablet Place 0.4 mg under the tongue every 5 (five) minutes as needed for chest pain.   Omega-3 Fatty Acids (FISH OIL) 1200 MG CAPS Take 1 capsule by mouth daily.   pantoprazole (PROTONIX) 40 MG tablet TAKE 1 TABLET BY MOUTH EVERY DAY   phenazopyridine (PYRIDIUM) 200 MG tablet Take 1 tablet (200 mg total) by mouth 3 (three) times daily as needed for pain.   predniSONE (DELTASONE) 20 MG tablet Take 2 tablets (40 mg total) by mouth daily with breakfast.   Riboflavin 100 MG CAPS Take 1 capsule by mouth daily.   rosuvastatin (CRESTOR) 10 MG tablet Take by mouth.   traMADol (ULTRAM) 50 MG tablet Take 50 mg  by mouth every 6 (six) hours as needed.   TURMERIC PO Take 1 tablet by mouth daily.    Past Medical History:  Diagnosis Date   ALLERGIC RHINITIS 04/03/2007   Allergy    Not sure   Anemia    ((Pt Qnr Sub: Denies at visit from 08/28/2021 with endocrinology))    Anxiety    Arthritis Not sure   Asthma    hx of years ago - no longer a problem    ASTHMA, PERSISTENT, MODERATE 04/03/2007   Cancer ([place])    skin    Dyspnea on exertion    a. 11/2007 Echo: EF 60%.   Dysrhythmia    hx of heart arrhythmia 10-15 years ago - followed by DR Lovena Le - not seen in years    GERD (gastroesophageal reflux disease)    Hemophilia carrier    Hypertension     Joint pain    HIPS / LEGS   Knee injury    RT   Meningioma ([place])    Morbid obesity ([place])    Neuromuscular disorder ([place]) Not sure, since my fall   Paroxysmal SVT (supraventricular tachycardia)    a. 11/2011 48h Holter: RSR, rare PVC's, occas PAC's   Retinal tear of right eye 01/2016   Ventral hernia     Past Surgical History:  Procedure Laterality Date   [place]   BIOPSY THYROID     08/2021   BREAST CYST EXCISION Right 1994   Benign   CESAREAN SECTION     x2   CHOLECYSTECTOMY  1995   COLONOSCOPY WITH PROPOFOL N/A 05/09/2021   Procedure: COLONOSCOPY WITH PROPOFOL;  Surgeon: [name], MD;  Location: [facility] ENDOSCOPY;  Service: Gastroenterology;  Laterality: N/A;   EYE SURGERY  01/2016   Repair retinal tear   FOOT SURGERY  1995 / 1996   x2   HERNIA REPAIR     KNEE ARTHROSCOPY W/ MENISCAL REPAIR  07/25/2013   right knee Dr. Mardelle Matte   LAPAROSCOPIC GASTRIC SLEEVE RESECTION N/A 06/08/2013   Procedure: LAPAROSCOPIC GASTRIC SLEEVE RESECTION AND EXCISION OF SEBACEUS CYST FROM MID CHEST takedown of incarcerated ventral hernia and primary repair endoscopy;  Surgeon: [name], MD;  Location: [facility] ORS;  Service: General;  Laterality: N/A;   LAPAROSCOPIC NISSEN FUNDOPLICATION N/A [id number]/[id number]   Procedure: LAPAROSCOPIC REPAIR LARGE SYMPTOMATIC HIATAL HERNIA WITH UPPER ENDOSCOPY;  Surgeon: Johnathan Hausen, MD;  Location: [facility] ORS;  Service: General;  Laterality: N/A;   moles  06/2013   removed 2 moles from under arm and  lowback   NECK SURGERY     occipital nerve damage- injections    OVARIAN CYST Divide EXCISIONAL BREAST BIOPSY Right 06/23/2021   Procedure: RADIOACTIVE SEED GUIDED EXCISIONAL RIGHT BREAST BIOPSY;  Surgeon: Johnathan Hausen, MD;  Location: [place];  Service: General;  Laterality: Right;   RETINAL TEAR REPAIR CRYOTHERAPY Right 01/2016    [place]   SKIN CANCER EXCISION     TONSILLECTOMY     TUBAL LIGATION  Think 1994   UPPER GI ENDOSCOPY  06/08/2013   Procedure: UPPER GI ENDOSCOPY;  Surgeon: [name], MD;  Location: [facility];  Service: General;;   US ECHOCARDIOGRAPHY  12/2011   WNL - EF 55-60%, mild MR, grade 1 diastolic dysnfiction (mild)   [place]    Social History Social History  Tobacco Use   Smoking status: Never   Smokeless tobacco: Never   Tobacco comments:    smoke at age 31-12  Vaping Use   Vaping Use: Never used  Substance Use Topics   Alcohol use: Not Currently   Drug use: No    Family History Family History  Problem Relation Age of Onset   Breast cancer Mother    Diabetes Mother    Hypertension Mother    Kidney failure Mother    Diabetes Father    Hypertension Father    Diabetes Other    Hypertension Other    Stroke Other    Hemophilia Other     Allergies  Allergen Reactions   Wasp Venom Anaphylaxis   Chlorhexidine Gluconate     Itching under breasts   Metronidazole Other (See Comments)    Chest tightness, neck tightness   Prednisone Other (See Comments)    Severe migraine / with taper dose   Sodium Hypochlorite Rash    Liquid clorox bleach     REVIEW OF SYSTEMS (Negative unless checked)  Constitutional: '[]'$ Weight loss  '[]'$ Fever  '[]'$ Chills Cardiac: '[]'$ Chest pain   '[]'$ Chest pressure   '[]'$ Palpitations   '[]'$ Shortness of breath when laying flat   '[]'$ Shortness of breath with exertion. Vascular:  '[]'$ Pain in legs with walking   '[x]'$ Pain in legs at rest  '[]'$ History of DVT   '[]'$ Phlebitis   '[x]'$ Swelling in legs   '[]'$ Varicose veins   '[]'$ Non-healing ulcers Pulmonary:   '[]'$ Uses home oxygen   '[]'$ Productive cough   '[]'$ Hemoptysis   '[]'$ Wheeze  '[]'$ COPD   '[]'$ Asthma Neurologic:  '[]'$ Dizziness   '[]'$ Seizures   '[]'$ History of stroke   '[]'$ History of TIA  '[]'$ Aphasia   '[]'$ Vissual changes   '[]'$ Weakness or numbness in arm   '[]'$ Weakness or numbness in leg Musculoskeletal:   '[]'$ Joint swelling   '[]'$ Joint pain    '[]'$ Low back pain Hematologic:  '[]'$ Easy bruising  '[]'$ Easy bleeding   '[]'$ Hypercoagulable state   '[]'$ Anemic Gastrointestinal:  '[]'$ Diarrhea   '[]'$ Vomiting  '[]'$ Gastroesophageal reflux/heartburn   '[]'$ Difficulty swallowing. Genitourinary:  '[]'$ Chronic kidney disease   '[]'$ Difficult urination  '[]'$ Frequent urination   '[]'$ Blood in urine Skin:  '[]'$ Rashes   '[]'$ Ulcers  Psychological:  '[]'$ History of anxiety   '[]'$  History of major depression.  Physical Examination  Vitals:   03/19/22 1432  BP: 135/83  Pulse: 75  Resp: 16  Weight: 286 lb (129.7 kg)   Body mass index is 46.16 kg/m. Gen: WD/WN, NAD Head: [place]/AT, No temporalis wasting.  Ear/Nose/Throat: Hearing grossly intact, nares w/o erythema or drainage, pinna without lesions Eyes: PER, EOMI, sclera nonicteric.  Neck: Supple, no gross masses.  No JVD.  Pulmonary:  Good air movement, no audible wheezing, no use of accessory muscles.  Cardiac: RRR, precordium not hyperdynamic. Vascular:  Palpable temporal pulses tender on the left.  scattered varicosities present bilaterally.  Moderate venous stasis changes to the legs bilaterally.  2+ soft pitting edema  Vessel Right Left  Radial Palpable Palpable  Gastrointestinal: soft, non-distended. No guarding/no peritoneal signs.  Musculoskeletal: M/S 5/5 throughout.  No deformity.  Neurologic: CN 2-12 intact. Pain and light touch intact in extremities.  Symmetrical.  Speech is fluent. Motor exam as listed above. Psychiatric: Judgment intact, Mood & affect appropriate for pt's clinical situation. Dermatologic: Venous rashes no ulcers noted.  No changes consistent with cellulitis. Lymph : No lichenification or skin changes of chronic lymphedema.  CBC Lab Results  Component Value Date   WBC 7.8 02/20/2022   HGB 12.1 02/20/2022  HCT 37.9 02/20/2022   MCV 82.6 02/20/2022   PLT 323 02/20/2022    BMET    Component Value Date/Time   NA 139 09/21/2021 1222   NA 139 01/21/2020 1137   K 4.1 09/21/2021 1222   CL 102  09/21/2021 1222   CO2 29 09/21/2021 1222   GLUCOSE 89 02/22/2022 0815   BUN 19 09/21/2021 1222   BUN 16 01/21/2020 1137   CREATININE 0.50 09/21/2021 1222   CREATININE 0.57 01/01/2020 1623   CALCIUM 9.6 03/14/2022 1434   GFRNONAA >60 09/21/2021 1222   GFRAA >60 02/02/2020 1417   CrCl cannot be calculated (Patient's most recent lab result is older than the maximum 21 days allowed.).  COAG Lab Results  Component Value Date   INR 1.0 12/03/2007   INR 1.0 11/25/2007    Radiology MR Brain W Wo Contrast  Result Date: 03/09/2022 CLINICAL DATA:  Headache, new or worsening (Age >= 50y) Brain/CNS neoplasm, monitor EXAM: MRI HEAD WITHOUT AND WITH CONTRAST TECHNIQUE: Multiplanar, multiecho pulse sequences of the brain and surrounding structures were obtained without and with intravenous contrast. CONTRAST:  10 mL VueWay IV COMPARISON:  MRI 06/02/2020. FINDINGS: Brain: Unchanged size/appearance of a 2.0 x 2.3 cm extra-axial dural-based enhancing mass along the high left frontal convexity at the vertex (series 18, image 137). Additional 5 mm focus of dural-based enhancement along the right frontal cortex (series 18, image 122) is also unchanged. No significant mass effect or brain edema. No evidence of acute infarct, acute hemorrhage, midline shift, or hydrocephalus. Partially empty sella. Vascular: Major arterial flow voids are maintained skull base. Skull and upper cervical spine: Normal marrow signal. Sinuses/Orbits: Clear sinuses.  No acute orbital findings. Other: No mastoid effusions. IMPRESSION: 1. No evidence of acute intracranial abnormality. 2. Unchanged size/appearance of a 2.3 cm putative meningioma along the high left frontal vertex. 3. Possible additional 6 mm meningioma along the right frontal convexity, also unchanged. 4. Partially empty sella, which is often a normal anatomic variant but can be associated with idiopathic intracranial hypertension. Electronically Signed   By: Margaretha Sheffield M.D.   On: 03/09/2022 17:07   US Carotid Bilateral  Result Date: 02/22/2022 CLINICAL DATA:  Transient visual loss of right eye EXAM: BILATERAL CAROTID DUPLEX ULTRASOUND TECHNIQUE: Pearline Cables scale imaging, color Doppler and duplex ultrasound were performed of bilateral carotid and vertebral arteries in the neck. COMPARISON:  None Available. FINDINGS: Criteria: Quantification of carotid stenosis is based on velocity parameters that correlate the residual internal carotid diameter with NASCET-based stenosis levels, using the diameter of the distal internal carotid lumen as the denominator for stenosis measurement. The following velocity measurements were obtained: RIGHT ICA: 89/30 cm/sec CCA: 38/18 cm/sec SYSTOLIC ICA/CCA RATIO:  1.0 ECA: 78 cm/sec LEFT ICA: 93/19 cm/sec CCA: 299/37 cm/sec SYSTOLIC ICA/CCA RATIO:  0.9 ECA: 79 cm/sec RIGHT CAROTID ARTERY: No significant atherosclerotic plaque. No stenosis by Doppler criteria. Normal low resistance waveforms of the internal carotid artery. RIGHT VERTEBRAL ARTERY: Antegrade flow. Dampened arterial waveforms appears artifactual. LEFT CAROTID ARTERY: Minimal atherosclerotic plaque at the origin of the internal carotid artery. This results in less than 50% stenosis by Doppler criteria. Normal low resistance waveforms in the internal carotid artery. LEFT VERTEBRAL ARTERY:  Antegrade flow IMPRESSION: 1. No significant atherosclerotic plaque of the right carotid artery. No stenosis by Doppler criteria. 2. Minimal atherosclerotic plaque at the origin of the left internal carotid artery results in less than 50% stenosis by Doppler criteria. 3. Bilateral vertebral arteries demonstrate antegrade  flow. Dampened appearance of arterial waveforms in the right vertebral artery is favored artifactual. If this could be of clinical concern, consider further evaluation with CTA neck. Electronically Signed   By: Albin Felling M.D.   On: 02/22/2022 11:56     Assessment/Plan 1.  Severe headache Recommend:  The patient has signs and symptoms that are suggestive of temporal arteritis  Patient should have a left temporal artery biopsy done.  The risks, benefits and alternative therapies were reviewed in detail with the patient.  All questions were answered.  The patient agrees to proceed with surgery.   The patient will follow up with me in the office after the surgery to review the pathology.   2. Chronic venous insufficiency Recommend:  No surgery or intervention at this point in time.    I have reviewed my discussion with the patient regarding lymphedema and why it  causes symptoms.  Patient will continue wearing graduated compression on a daily basis. The patient should put the compression on first thing in the morning and removing them in the evening. The patient should not sleep in the compression.   In addition, behavioral modification throughout the day will be continued.  This will include frequent elevation (such as in a recliner), use of over the counter pain medications as needed and exercise such as walking.  The systemic causes for chronic edema such as liver, kidney and cardiac etiologies does not appear to have significant changed over the past year.    The patient will continue aggressive use of the  lymph pump.  This will continue to improve the edema control and prevent sequela such as ulcers and infections.   The patient will follow-up with me on an annual basis.    3. Primary hypertension Continue antihypertensive medications as already ordered, these medications have been reviewed and there are no changes at this time.   4. Coronary artery disease of native artery of native heart with stable angina pectoris (HCC) Continue cardiac and antihypertensive medications as already ordered and reviewed, no changes at this time.  Continue statin as ordered and reviewed, no changes at this time  Nitrates PRN for chest pain   5.  Hypercholesteremia Continue statin as ordered and reviewed, no changes at this time     Hortencia Pilar, MD  03/19/2022 4:07 PM

## 2022-03-20 ENCOUNTER — Encounter
Admission: RE | Admit: 2022-03-20 | Discharge: 2022-03-20 | Disposition: A | Discharge status: Home / Self Care | Payer: Medicare Other | Source: Ambulatory Visit | Attending: Vascular Surgery | Admitting: Vascular Surgery

## 2022-03-20 ENCOUNTER — Other Ambulatory Visit (INDEPENDENT_AMBULATORY_CARE_PROVIDER_SITE_OTHER): Payer: Self-pay | Admitting: Nurse Practitioner

## 2022-03-20 ENCOUNTER — Telehealth (INDEPENDENT_AMBULATORY_CARE_PROVIDER_SITE_OTHER): Payer: Self-pay

## 2022-03-20 ENCOUNTER — Other Ambulatory Visit: Payer: Self-pay | Admitting: Family Medicine

## 2022-03-20 DIAGNOSIS — R519 Headache, unspecified: Secondary | ICD-10-CM

## 2022-03-20 HISTORY — DX: Prediabetes: R73.03

## 2022-03-20 HISTORY — DX: Angina pectoris, unspecified: I20.9

## 2022-03-20 MED ORDER — NITROGLYCERIN 0.4 MG SL SUBL: 0.4000 mg | SUBLINGUAL_TABLET | SUBLINGUAL | 0 refills | Status: DC | PRN

## 2022-03-20 NOTE — Telephone Encounter (Signed)
Spoke with the patient and she is scheduled with Dr. Delana Meyer on 03/21/22 for a left temporal artery biopsy at [facility]. Pre-op phone call will be today. Pre-surgical instructions were discussed and patient stated she wrote the information down.

## 2022-03-20 NOTE — Patient Instructions (Addendum)
Your procedure is scheduled on: Wednesday March 21, 2022. Report to Day Surgery inside [facility] 2nd floor, stop by registration desk before getting one elevator.   Remember: Instructions that are not followed completely may result in serious medical risk,  up to and including death, or upon the discretion of your surgeon and anesthesiologist your  surgery may need to be rescheduled.     _X__ 1. Do not eat food after midnight the night before your procedure.                 No chewing gum or hard candies.   __X__2.  On the morning of surgery brush your teeth with toothpaste and water, you                may rinse your mouth with mouthwash if you wish.  Do not swallow any toothpaste or mouthwash.     _X__ 3.  No Alcohol for 24 hours before or after surgery.   _X__ 4.  Do Not Smoke or use e-cigarettes For 24 Hours Prior to Your Surgery.                 Do not use any chewable tobacco products for at least 6 hours prior to                 Surgery.  _X__  5.  Do not use any recreational drugs (marijuana, cocaine, heroin, ecstasy, MDMA or other)                For at least one week prior to your surgery.  Combination of these drugs with anesthesia                May have life threatening results.  ____  6.  Bring all medications with you on the day of surgery if instructed.   __X__  7.  Notify your doctor if there is any change in your medical condition      (cold, fever, infections).     Do not wear jewelry, make-up, hairpins, clips or nail polish. Do not wear lotions, powders, or perfumes. You may wear deodorant. Do not shave 48 hours prior to surgery. Men may shave face and neck. Do not bring valuables to the hospital.    [facility] is not responsible for any belongings or valuables.  Contacts, dentures or bridgework may not be worn into surgery. Leave your suitcase in the car. After surgery it may be brought to your room. For patients admitted to the  hospital, discharge time is determined by your treatment team.   Patients discharged the day of surgery will not be allowed to drive home.   Make arrangements for someone to be with you for the first 24 hours of your Same Day Discharge.   __X__ Take these medicines the morning of surgery with A SIP OF WATER:    1. diltiazem (CARDIZEM SR) 120 MG 12 hr capsule  2. pantoprazole (PROTONIX) 40 MG  3.   4.  5.  6.  ____ Fleet Enema (as directed)   ____ Use CHG Soap (or wipes) as directed  ____ Use Benzoyl Peroxide Gel as instructed  _X___ Use inhalers on the day of surgery  budesonide-formoterol (SYMBICORT) 160-4.5 MCG/ACT inhaler ____ Stop metformin 2 days prior to surgery    ____ Take 1/2 of usual insulin dose the night before surgery. No insulin the morning          of surgery.  ____ Call your PCP, cardiologist, or Pulmonologist if taking Coumadin/Plavix/aspirin and ask when to stop before your surgery.   __X__ One Week prior to surgery- Stop Anti-inflammatories such as Ibuprofen, Aleve, Advil, Motrin, meloxicam (MOBIC), diclofenac, etodolac, ketorolac, Toradol, Daypro, piroxicam, Goody's or BC powders. OK TO USE TYLENOL IF NEEDED   __X__ Stop supplements until after surgery.    ____ Bring C-Pap to the hospital.    If you have any questions regarding your pre-procedure instructions,  Please call Pre-admit Testing at [phone]

## 2022-03-21 ENCOUNTER — Other Ambulatory Visit: Payer: Self-pay

## 2022-03-21 ENCOUNTER — Ambulatory Visit: Payer: Medicare Other | Admitting: Certified Registered"

## 2022-03-21 ENCOUNTER — Encounter: Payer: Self-pay | Admitting: Vascular Surgery

## 2022-03-21 ENCOUNTER — Other Ambulatory Visit: Payer: Self-pay | Admitting: Family Medicine

## 2022-03-21 ENCOUNTER — Ambulatory Visit
Admission: RE | Admit: 2022-03-21 | Discharge: 2022-03-21 | Disposition: A | Discharge status: Home / Self Care | Payer: Medicare Other | Source: Ambulatory Visit | Attending: Vascular Surgery | Admitting: Vascular Surgery

## 2022-03-21 ENCOUNTER — Encounter
Admission: RE | Disposition: A | Discharge status: Home / Self Care | Payer: Self-pay | Source: Ambulatory Visit | Attending: Vascular Surgery

## 2022-03-21 DIAGNOSIS — G4489 Other headache syndrome: Secondary | ICD-10-CM | POA: Diagnosis not present

## 2022-03-21 DIAGNOSIS — R519 Headache, unspecified: Secondary | ICD-10-CM | POA: Insufficient documentation

## 2022-03-21 DIAGNOSIS — K219 Gastro-esophageal reflux disease without esophagitis: Secondary | ICD-10-CM | POA: Insufficient documentation

## 2022-03-21 DIAGNOSIS — I10 Essential (primary) hypertension: Secondary | ICD-10-CM | POA: Insufficient documentation

## 2022-03-21 DIAGNOSIS — F419 Anxiety disorder, unspecified: Secondary | ICD-10-CM | POA: Insufficient documentation

## 2022-03-21 DIAGNOSIS — Z6841 Body Mass Index (BMI) 40.0 and over, adult: Secondary | ICD-10-CM | POA: Diagnosis not present

## 2022-03-21 DIAGNOSIS — E785 Hyperlipidemia, unspecified: Secondary | ICD-10-CM | POA: Diagnosis not present

## 2022-03-21 DIAGNOSIS — L040 Acute lymphadenitis of face, head and neck: Secondary | ICD-10-CM | POA: Diagnosis not present

## 2022-03-21 DIAGNOSIS — M316 Other giant cell arteritis: Secondary | ICD-10-CM | POA: Diagnosis not present

## 2022-03-21 HISTORY — PX: ARTERY BIOPSY: SHX891

## 2022-03-21 LAB — TYPE AND SCREEN
ABO/RH(D): B POS
Antibody Screen: NEGATIVE

## 2022-03-21 SURGERY — BIOPSY TEMPORAL ARTERY
Anesthesia: General | Site: Head | Laterality: Left

## 2022-03-21 MED ORDER — FENTANYL CITRATE (PF) 100 MCG/2ML IJ SOLN
INTRAMUSCULAR | Status: AC
  Filled 2022-03-21: qty 2

## 2022-03-21 MED ORDER — PHENYLEPHRINE HCL (PRESSORS) 10 MG/ML IV SOLN
INTRAVENOUS | Status: AC
  Filled 2022-03-21: qty 1

## 2022-03-21 MED ORDER — LIDOCAINE-EPINEPHRINE 1 %-1:100000 IJ SOLN
INTRAMUSCULAR | Status: AC
  Filled 2022-03-21: qty 1

## 2022-03-21 MED ORDER — GLYCOPYRROLATE 0.2 MG/ML IJ SOLN
INTRAMUSCULAR | Status: DC | PRN
  Administered 2022-03-21: .2 mg via INTRAVENOUS

## 2022-03-21 MED ORDER — LACTATED RINGERS IV SOLN: INTRAVENOUS | Status: DC

## 2022-03-21 MED ORDER — PROPOFOL 1000 MG/100ML IV EMUL
INTRAVENOUS | Status: AC
  Filled 2022-03-21: qty 100

## 2022-03-21 MED ORDER — ONDANSETRON HCL 4 MG/2ML IJ SOLN
INTRAMUSCULAR | Status: DC | PRN
  Administered 2022-03-21: 4 mg via INTRAVENOUS

## 2022-03-21 MED ORDER — LIDOCAINE HCL (CARDIAC) PF 100 MG/5ML IV SOSY
PREFILLED_SYRINGE | INTRAVENOUS | Status: DC | PRN
  Administered 2022-03-21: 100 mg via INTRATRACHEAL

## 2022-03-21 MED ORDER — CEFAZOLIN SODIUM-DEXTROSE 2-4 GM/100ML-% IV SOLN
INTRAVENOUS | Status: AC
  Filled 2022-03-21: qty 100

## 2022-03-21 MED ORDER — BUPIVACAINE HCL (PF) 0.5 % IJ SOLN
INTRAMUSCULAR | Status: DC | PRN
  Administered 2022-03-21: 6 mL
  Administered 2022-03-21: 7 mL

## 2022-03-21 MED ORDER — ACETAMINOPHEN 10 MG/ML IV SOLN
INTRAVENOUS | Status: DC | PRN
  Administered 2022-03-21: 1000 mg via INTRAVENOUS

## 2022-03-21 MED ORDER — CEFAZOLIN IN SODIUM CHLORIDE 3-0.9 GM/100ML-% IV SOLN
3.0000 g | INTRAVENOUS | Status: AC
  Administered 2022-03-21: 3 g via INTRAVENOUS
  Filled 2022-03-21 (×2): qty 100

## 2022-03-21 MED ORDER — FENTANYL CITRATE (PF) 100 MCG/2ML IJ SOLN: 25.0000 ug | INTRAMUSCULAR | Status: DC | PRN

## 2022-03-21 MED ORDER — PREDNISONE 20 MG PO TABS: 40.0000 mg | ORAL_TABLET | Freq: Every day | ORAL | 0 refills | Status: DC

## 2022-03-21 MED ORDER — LIDOCAINE-EPINEPHRINE 1 %-1:100000 IJ SOLN
INTRAMUSCULAR | Status: DC | PRN
  Administered 2022-03-21: 6 mL via INTRADERMAL

## 2022-03-21 MED ORDER — EPHEDRINE SULFATE (PRESSORS) 50 MG/ML IJ SOLN
INTRAMUSCULAR | Status: DC | PRN
  Administered 2022-03-21 (×3): 5 mg via INTRAVENOUS

## 2022-03-21 MED ORDER — PROPOFOL 500 MG/50ML IV EMUL
INTRAVENOUS | Status: DC | PRN
  Administered 2022-03-21: 125 ug/kg/min via INTRAVENOUS

## 2022-03-21 MED ORDER — FENTANYL CITRATE (PF) 100 MCG/2ML IJ SOLN
INTRAMUSCULAR | Status: DC | PRN
  Administered 2022-03-21: 25 ug via INTRAVENOUS

## 2022-03-21 MED ORDER — MIDAZOLAM HCL 2 MG/2ML IJ SOLN
INTRAMUSCULAR | Status: AC
  Filled 2022-03-21: qty 2

## 2022-03-21 MED ORDER — PHENYLEPHRINE 80 MCG/ML (10ML) SYRINGE FOR IV PUSH (FOR BLOOD PRESSURE SUPPORT)
PREFILLED_SYRINGE | INTRAVENOUS | Status: DC | PRN
  Administered 2022-03-21 (×3): 80 ug via INTRAVENOUS

## 2022-03-21 MED ORDER — PROPOFOL 10 MG/ML IV BOLUS
INTRAVENOUS | Status: DC | PRN
  Administered 2022-03-21: 20 mg via INTRAVENOUS
  Administered 2022-03-21: 60 mg via INTRAVENOUS
  Administered 2022-03-21: 10 mg via INTRAVENOUS

## 2022-03-21 MED ORDER — HYDROCODONE-ACETAMINOPHEN 5-325 MG PO TABS: 1.0000 | ORAL_TABLET | Freq: Four times a day (QID) | ORAL | 0 refills | Status: DC | PRN

## 2022-03-21 MED ORDER — BUPIVACAINE HCL (PF) 0.5 % IJ SOLN
INTRAMUSCULAR | Status: AC
  Filled 2022-03-21: qty 30

## 2022-03-21 MED ORDER — PROPOFOL 10 MG/ML IV BOLUS
INTRAVENOUS | Status: AC
  Filled 2022-03-21: qty 20

## 2022-03-21 SURGICAL SUPPLY — 41 items
ADH SKN CLS APL DERMABOND .7 (GAUZE/BANDAGES/DRESSINGS) ×1
BALL CTTN STRL (GAUZE/BANDAGES/DRESSINGS) ×1
BLADE CLIPPER SURG (BLADE) ×1 IMPLANT
BLADE SURG 15 STRL LF DISP TIS (BLADE) ×1 IMPLANT
BLADE SURG 15 STRL SS (BLADE) ×1
COTTON BALL STERILE (GAUZE/BANDAGES/DRESSINGS) ×1
COTTON BALL STERILE 4 PK (GAUZE/BANDAGES/DRESSINGS) ×1 IMPLANT
DERMABOND ADVANCED .7 DNX12 (GAUZE/BANDAGES/DRESSINGS) ×1 IMPLANT
DRAPE LAPAROTOMY 77X122 PED (DRAPES) ×1 IMPLANT
DRSG TELFA 3X4 N-ADH STERILE (GAUZE/BANDAGES/DRESSINGS) ×1 IMPLANT
ELECT CAUTERY BLADE 6.4 (BLADE) ×1 IMPLANT
ELECT REM PT RETURN 9FT ADLT (ELECTROSURGICAL) ×1
ELECTRODE REM PT RTRN 9FT ADLT (ELECTROSURGICAL) ×1 IMPLANT
GAUZE 4X4 16PLY ~~LOC~~+RFID DBL (SPONGE) ×1 IMPLANT
GLOVE SURG SYN 8.0 (GLOVE) ×1 IMPLANT
GLOVE SURG SYN 8.0 PF PI (GLOVE) ×1 IMPLANT
GOWN STRL REUS W/ TWL LRG LVL3 (GOWN DISPOSABLE) ×1 IMPLANT
GOWN STRL REUS W/ TWL XL LVL3 (GOWN DISPOSABLE) ×1 IMPLANT
GOWN STRL REUS W/TWL LRG LVL3 (GOWN DISPOSABLE) ×1
GOWN STRL REUS W/TWL XL LVL3 (GOWN DISPOSABLE) ×1
LABEL OR SOLS (LABEL) ×1 IMPLANT
MANIFOLD NEPTUNE II (INSTRUMENTS) ×1 IMPLANT
NDL HYPO 25X1 1.5 SAFETY (NEEDLE) ×2 IMPLANT
NEEDLE HYPO 25X1 1.5 SAFETY (NEEDLE) ×2 IMPLANT
NS IRRIG 500ML POUR BTL (IV SOLUTION) ×1 IMPLANT
PACK BASIN MINOR ARMC (MISCELLANEOUS) ×1 IMPLANT
SOL PREP PVP 2OZ (MISCELLANEOUS) ×1
SOLUTION PREP PVP 2OZ (MISCELLANEOUS) ×1 IMPLANT
SUCTION FRAZIER HANDLE 10FR (MISCELLANEOUS) ×1
SUCTION TUBE FRAZIER 10FR DISP (MISCELLANEOUS) ×1 IMPLANT
SUT MNCRL AB 4-0 PS2 18 (SUTURE) ×1 IMPLANT
SUT SILK 3 0 (SUTURE) ×1
SUT SILK 3-0 18XBRD TIE 12 (SUTURE) ×1 IMPLANT
SUT SILK 4 0 (SUTURE) ×1
SUT SILK 4-0 18XBRD TIE 12 (SUTURE) ×1 IMPLANT
SUT VIC AB 3-0 SH 27 (SUTURE) ×1
SUT VIC AB 3-0 SH 27X BRD (SUTURE) ×1 IMPLANT
SYR 10ML LL (SYRINGE) ×2 IMPLANT
SYR BULB IRRIG 60ML STRL (SYRINGE) ×1 IMPLANT
TRAP FLUID SMOKE EVACUATOR (MISCELLANEOUS) ×1 IMPLANT
WATER STERILE IRR 500ML POUR (IV SOLUTION) ×1 IMPLANT

## 2022-03-21 NOTE — Discharge Instructions (Signed)
AMBULATORY SURGERY  ?DISCHARGE INSTRUCTIONS ? ? ?The drugs that you were given will stay in your system until tomorrow so for the next 24 hours you should not: ? ?Drive an automobile ?Make any legal decisions ?Drink any alcoholic beverage ? ? ?You may resume regular meals tomorrow.  Today it is better to start with liquids and gradually work up to solid foods. ? ?You may eat anything you prefer, but it is better to start with liquids, then soup and crackers, and gradually work up to solid foods. ? ? ?Please notify your doctor immediately if you have any unusual bleeding, trouble breathing, redness and pain at the surgery site, drainage, fever, or pain not relieved by medication. ? ? ? ?Additional Instructions: ? ? ? ?Please contact your physician with any problems or Same Day Surgery at [phone], Monday through Friday 6 am to 4 pm, or [place] [facility] number at [phone].  ?

## 2022-03-21 NOTE — Interval H&P Note (Signed)
History and Physical Interval Note:  03/21/2022 7:46 AM  Kimberly Patton  has presented today for surgery, with the diagnosis of Biopsy Temporal Artery.  The various methods of treatment have been discussed with the patient and family. After consideration of risks, benefits and other options for treatment, the patient has consented to  Procedure(s): BIOPSY TEMPORAL ARTERY (Left) as a surgical intervention.  The patient's history has been reviewed, patient examined, no change in status, stable for surgery.  I have reviewed the patient's chart and labs.  Questions were answered to the patient's satisfaction.     Hortencia Pilar

## 2022-03-21 NOTE — Interval H&P Note (Signed)
History and Physical Interval Note:  03/21/2022 7:26 AM  Kimberly Patton  has presented today for surgery, with the diagnosis of Biopsy Temporal Artery.  The various methods of treatment have been discussed with the patient and family. After consideration of risks, benefits and other options for treatment, the patient has consented to  Procedure(s): BIOPSY TEMPORAL ARTERY (Left) as a surgical intervention.  The patient's history has been reviewed, patient examined, no change in status, stable for surgery.  I have reviewed the patient's chart and labs.  Questions were answered to the patient's satisfaction.     Leotis Pain

## 2022-03-21 NOTE — Anesthesia Preprocedure Evaluation (Addendum)
Anesthesia Evaluation  Patient identified by MRN, date of birth, ID band Patient awake    Reviewed: Allergy & Precautions, H&P , NPO status , Patient's Chart, lab work & pertinent test results, reviewed documented beta blocker date and time   History of Anesthesia Complications Negative for: history of anesthetic complications  Airway Mallampati: II   Neck ROM: full    Dental  (+) Poor Dentition, Dental Advidsory Given   Pulmonary neg shortness of breath, asthma , neg sleep apnea, neg COPD, neg recent URI,    Pulmonary exam normal        Cardiovascular Exercise Tolerance: Good hypertension, On Medications (-) angina+ CAD  (-) Past MI Normal cardiovascular exam+ dysrhythmias (-) Valvular Problems/Murmurs Rhythm:regular Rate:Normal     Neuro/Psych neg Seizures Anxiety  Neuromuscular disease negative psych ROS   GI/Hepatic Neg liver ROS, GERD  Medicated,  Endo/Other  neg diabetesMorbid obesity  Renal/GU negative Renal ROS  negative genitourinary   Musculoskeletal   Abdominal   Peds  Hematology  (+) Blood dyscrasia, anemia ,   Anesthesia Other Findings Past Medical History: 04/03/2007: ALLERGIC RHINITIS No date: Anemia No date: Anxiety No date: Asthma     Comment:  hx of years ago - no longer a problem  04/03/2007: ASTHMA, PERSISTENT, MODERATE No date: Cancer ([place])     Comment:  skin  No date: Dyspnea on exertion     Comment:  a. 11/2007 Echo: EF 60%. 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 11/2011 48h  Holter: RSR, rare PVC's, occas PAC's 01/2016: Retinal tear of right eye No date: Ventral hernia Past Surgical History: 1996: APPENDECTOMY 1994: BREAST CYST EXCISION; Right     Comment:  Benign No date: CESAREAN SECTION     Comment:  x2 1995: CHOLECYSTECTOMY 01/2016: EYE SURGERY     Comment:  Repair retinal tear 1995 / 1996: FOOT SURGERY     Comment:  x2 No date: HERNIA REPAIR 07/25/13: KNEE ARTHROSCOPY W/ MENISCAL REPAIR     Comment:  right knee Dr. Mardelle Matte 06/08/2013: LAPAROSCOPIC GASTRIC SLEEVE RESECTION; N/A     Comment:  Procedure: LAPAROSCOPIC GASTRIC SLEEVE RESECTION AND               EXCISION OF SEBACEUS CYST FROM MID CHEST takedown of               incarcerated ventral hernia and primary repair endoscopy;              Surgeon: [name], MD;  Location: [facility];                Service: General;  Laterality: N/A; 02/02/2020: LAPAROSCOPIC NISSEN FUNDOPLICATION; N/A     Comment:  Procedure: LAPAROSCOPIC REPAIR LARGE SYMPTOMATIC HIATAL               HERNIA WITH UPPER ENDOSCOPY;  Surgeon: Johnathan Hausen,               MD;  Location: [facility];  Service: General;  Laterality:               N/A; 06/2013: moles     Comment:  removed 2 moles from under arm and  lowback No date: NECK SURGERY     Comment:  occipital nerve damage- injections  1970: OVARIAN CYST REMOVAL 1975: PILONIDAL CYST EXCISION 01/2016: [place]; Right     Comment:  [place] No date: SKIN CANCER EXCISION No date: TONSILLECTOMY 06/08/2013: UPPER GI ENDOSCOPY     Comment:  Procedure: UPPER GI ENDOSCOPY;  Surgeon: [name], MD;  Location: [facility];  Service: General;; 12/2011: US ECHOCARDIOGRAPHY     Comment:  WNL - EF 55-60%, mild MR, grade 1 diastolic dysnfiction               (mild) 2000: VENTRAL HERNIA REPAIR   Reproductive/Obstetrics negative OB ROS                             Anesthesia Physical  Anesthesia Plan  ASA:  3  Anesthesia Plan: General   Post-op Pain Management:    Induction: Intravenous  PONV Risk Score and Plan: 3 and Propofol infusion and TIVA  Airway Management Planned: Natural Airway and Nasal Cannula  Additional Equipment:   Intra-op Plan:   Post-operative Plan:   Informed Consent: I have reviewed the patients History and Physical, chart, labs and discussed the procedure including the risks, benefits and alternatives for the proposed anesthesia with the patient or authorized representative who has indicated his/her understanding and acceptance.     Dental Advisory Given  Plan Discussed with: CRNA  Anesthesia Plan Comments:         Anesthesia Quick Evaluation

## 2022-03-21 NOTE — Transfer of Care (Signed)
Immediate Anesthesia Transfer of Care Note  Patient: Kimberly Patton  Procedure(s) Performed: BIOPSY TEMPORAL ARTERY (Left: Head)  Patient Location: PACU  Anesthesia Type:General  Level of Consciousness: awake, drowsy and patient cooperative  Airway & Oxygen Therapy: Patient Spontanous Breathing and Patient connected to face mask oxygen  Post-op Assessment: Report given to RN and Post -op Vital signs reviewed and stable  Post vital signs: Reviewed and stable  Last Vitals:  Vitals Value Taken Time  BP 104/55 03/21/22 0845  Temp    Pulse 81 03/21/22 0851  Resp 17 03/21/22 0851  SpO2 97 % 03/21/22 0851  Vitals shown include unvalidated device data.  Last Pain:  Vitals:   03/21/22 0844  TempSrc:   PainSc: 0-No pain         Complications: No notable events documented.

## 2022-03-21 NOTE — Op Note (Signed)
        OPERATIVE NOTE   PRE-OPERATIVE DIAGNOSIS: suspected temporal arteritis, headache  POST-OPERATIVE DIAGNOSIS: Same as above  PROCEDURE: 1.   Left temporal artery biopsy  SURGEON: Hortencia Pilar, MD  ASSISTANT(S): none  ANESTHESIA: MAC  ESTIMATED BLOOD LOSS: Minimal  FINDING(S): 1.  none  SPECIMEN(S):  Left superficial temporal artery sent to pathology  INDICATIONS:   Patient is a 66 y.o. female who presents with temporal headaches.  We were consulted by primary care for consideration for temporal artery biopsy. Risks and benefits were discussed and he was agreeable to proceed.  DESCRIPTION: After obtaining full informed written consent, the patient was brought back to the operating room and placed supine upon the operating table.  The patient received IV antibiotics prior to induction.  After obtaining adequate anesthesia, the patient was prepped and draped in the standard fashion. The area in front of his left ear was anesthetized copiously with a solution of 1% lidocaine and half percent Marcaine without epinephrine. I then made an incision just in front of the left ear overlying the palpable pulse. I then dissected down through the subcutaneous tissues and identified the superficial temporal artery. This was dissected out over a several centimeters and branches were ligated and divided between silk ties. Care was used to avoid electrocautery around the artery. I then clamped the artery proximally and distally and transected the artery. The specimen was then sent to pathology. The proximal and distal artery were ligated with 3-0 silk ties. Hemostasis was achieved. The wound was then closed with a series of interrupted 3-0 Vicryl's and the skin was closed with a 4-0 Monocryl. Sterile dressing was placed. The patient was taken to the recovery room in stable condition having tolerated the procedure well.  COMPLICATIONS: None  CONDITION: Stable   Hortencia Pilar 03/21/2022 7:59 AM  This note was created with Dragon Medical transcription system. Any errors in dictation are purely unintentional.

## 2022-03-22 ENCOUNTER — Encounter: Payer: Self-pay | Admitting: Vascular Surgery

## 2022-03-22 ENCOUNTER — Encounter (INDEPENDENT_AMBULATORY_CARE_PROVIDER_SITE_OTHER): Payer: Self-pay | Admitting: Vascular Surgery

## 2022-03-22 LAB — SURGICAL PATHOLOGY

## 2022-03-23 ENCOUNTER — Telehealth (INDEPENDENT_AMBULATORY_CARE_PROVIDER_SITE_OTHER): Payer: Self-pay | Admitting: Vascular Surgery

## 2022-03-23 NOTE — Telephone Encounter (Signed)
Spoke to pt regarding making a saline sclerotherapy appt - she stated that Dr. Delana Meyer just did a temporal biopsy on her yesterday and she would like to have these things straightened out before proceeding with sclerotherapy. I advised that the expiration date was 12.31.23 and I would need to cancel and re-auth if we were not completed by then. Pt acknowledged and stated she would call when ready. Nothing further needed at this time.

## 2022-03-23 NOTE — Addendum Note (Signed)
Addended by: [name] E on: 03/23/2022 05:52 PM   Modules accepted: Orders

## 2022-03-24 ENCOUNTER — Telehealth: Payer: Self-pay | Admitting: Nurse Practitioner

## 2022-03-24 ENCOUNTER — Emergency Department
Admission: EM | Admit: 2022-03-24 | Discharge: 2022-03-24 | Disposition: A | Discharge status: Home / Self Care | Payer: Medicare Other | Attending: Emergency Medicine | Admitting: Emergency Medicine

## 2022-03-24 ENCOUNTER — Encounter: Payer: Self-pay | Admitting: Cardiology

## 2022-03-24 ENCOUNTER — Other Ambulatory Visit: Payer: Self-pay

## 2022-03-24 ENCOUNTER — Emergency Department: Payer: Medicare Other

## 2022-03-24 ENCOUNTER — Ambulatory Visit
Admission: EM | Admit: 2022-03-24 | Discharge: 2022-03-24 | Disposition: A | Discharge status: Home / Self Care | Payer: Medicare Other | Attending: Urgent Care | Admitting: Urgent Care

## 2022-03-24 DIAGNOSIS — R000 Tachycardia, unspecified: Secondary | ICD-10-CM | POA: Diagnosis not present

## 2022-03-24 DIAGNOSIS — I4891 Unspecified atrial fibrillation: Secondary | ICD-10-CM

## 2022-03-24 DIAGNOSIS — J811 Chronic pulmonary edema: Secondary | ICD-10-CM | POA: Diagnosis not present

## 2022-03-24 DIAGNOSIS — I10 Essential (primary) hypertension: Secondary | ICD-10-CM | POA: Diagnosis not present

## 2022-03-24 DIAGNOSIS — D72829 Elevated white blood cell count, unspecified: Secondary | ICD-10-CM | POA: Diagnosis not present

## 2022-03-24 DIAGNOSIS — R002 Palpitations: Secondary | ICD-10-CM | POA: Diagnosis not present

## 2022-03-24 LAB — TROPONIN I (HIGH SENSITIVITY)
Troponin I (High Sensitivity): 9 ng/L (ref <18)
Troponin I (High Sensitivity): 10 ng/L (ref <18)

## 2022-03-24 LAB — BASIC METABOLIC PANEL
Anion gap: 9 (ref 5–15)
BUN: 24 mg/dL — ABNORMAL HIGH (ref 8–23)
CO2: 26 mmol/L (ref 22–32)
Calcium: 8.9 mg/dL (ref 8.9–10.3)
Chloride: 104 mmol/L (ref 98–111)
Creatinine, Ser: 0.7 mg/dL (ref 0.44–1.00)
GFR, Estimated: >60 mL/min (ref >60)
Glucose, Bld: 126 mg/dL — ABNORMAL HIGH (ref 70–99)
Potassium: 3.8 mmol/L (ref 3.5–5.1)
Sodium: 139 mmol/L (ref 135–145)

## 2022-03-24 LAB — CBC
HCT: 42.7 % (ref 36.0–46.0)
Hemoglobin: 13.2 g/dL (ref 12.0–15.0)
MCH: 26 pg (ref 26.0–34.0)
MCHC: 30.9 g/dL (ref 30.0–36.0)
MCV: 84.1 fL (ref 80.0–100.0)
Platelets: 297 10*3/uL (ref 150–400)
RBC: 5.08 MIL/uL (ref 3.87–5.11)
RDW: 15.6 % — ABNORMAL HIGH (ref 11.5–15.5)
WBC: 14 10*3/uL — ABNORMAL HIGH (ref 4.0–10.5)
nRBC: 0 % (ref 0.0–0.2)

## 2022-03-24 MED ORDER — APIXABAN 5 MG PO TABS: 5.0000 mg | ORAL_TABLET | Freq: Two times a day (BID) | ORAL | 0 refills | Status: DC

## 2022-03-24 MED ORDER — DILTIAZEM HCL 30 MG PO TABS: 30.0000 mg | ORAL_TABLET | Freq: Every day | ORAL | 0 refills | Status: DC | PRN

## 2022-03-24 NOTE — ED Notes (Signed)
Patient is being discharged from the Urgent Care and sent to the Emergency Department via POV . Per Annie Main Immordino, patient is in need of higher level of care due to Physicians assessment. Patient is aware and verbalizes understanding of plan of care.  Vitals:   03/24/22 0942  BP: (!) 138/90  Pulse: 93  Resp: 18  Temp: 98.4 F (36.9 C)  SpO2: 97%

## 2022-03-24 NOTE — Discharge Instructions (Addendum)
Continue taking your long-acting diltiazem but use the short acting diltiazem as needed for elevated heart rates >100.  You can take 1 pill and if is not getting better after an hour you can take another pill.  This is still not helping to break it after an hour then please come back into the ER to be evaluated.  We are starting you on a blood thinner so is important that if you develop any black tarry stools or you fall and hit your head that you come into the ER immediately.

## 2022-03-24 NOTE — ED Triage Notes (Signed)
Pt up to desk inquiring on wait time. RN apologetic to pt, reports working on getting rooms. Will recheck vitals and draw repeat lab work

## 2022-03-24 NOTE — ED Provider Notes (Addendum)
Roderic Palau    CSN: [id number] Arrival date & time: 03/24/22  9379      History   Chief Complaint No chief complaint on file.   HPI Kimberly Patton is a 66 y.o. female.   HPI  Patient presents to urgent care with complaint of palpitations.  Recently treated for temporal arteritis and using prednisone.  She contacted her cardiologist and stated that she woke this morning with palpitations.  She endorses BP in the 024O systolic with heart rate in the 140s.  States "blood pressure monitor is flashing".  Endorsed taking morning medication which includes diltiazem SR 120 mg.  The medical provider recommended she take an additional dose of diltiazem SR 120 and asked her to be evaluated in urgent care if she continued to note irregular heart beat or elevated blood pressure.  Past Medical History:  Diagnosis Date   ALLERGIC RHINITIS 04/03/2007   Allergy    Not sure   Anemia    ((Pt Qnr Sub: Denies at visit from 08/28/2021 with endocrinology))    Anginal pain ([place])    Anxiety    Arthritis Not sure   Asthma    hx of years ago - no longer a problem    ASTHMA, PERSISTENT, MODERATE 04/03/2007   Cancer ([place])    skin    Dyspnea on exertion    a. 11/2007 Echo: EF 60%.   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 11/2011 48h Holter: RSR, rare PVC's, occas PAC's   Pre-diabetes    Retinal tear of right eye 01/2016   Ventral hernia     Patient Active Problem List   Diagnosis Date Noted   Scalp tenderness 02/15/2022   Severe headache 02/15/2022   Varicose veins with inflammation 01/07/2022   Pre-op evaluation 01/02/2022   Breast pain,  right 10/10/2021   S/P breast lumpectomy 10/10/2021   Atrophic vaginitis 03/13/2021   Tinnitus of both ears 07/21/2020   Cervical radiculopathy 06/28/2020   Chronic venous insufficiency 05/29/2020   Lymphedema 05/29/2020   CAD (coronary artery disease) 05/29/2020   Cervical lymphadenitis 04/18/2020   Dysuria 03/25/2020   History of repair of hiatal hernia 02/02/2020   Bradycardia 01/01/2020   Orthostatic hypotension 01/01/2020   Globus sensation 11/23/2019   Elevated lipase 08/27/2019   Hypercholesteremia 06/09/2019   Chronic neuropathic pain 07/31/2018   Thyroid nodule 04/15/2018   Skin lesion of scalp 09/06/2017   Age-related osteoporosis without current pathological fracture 12/04/2016   Osteoarthritis of knee 07/17/2016   Numbness and tingling of both feet 01/27/2016   Meningioma ([place]) 01/18/2016   Family history of aortic stenosis 01/20/2015   Status post laparoscopic sleeve gastrectomy Dec 2014 06/08/2013   Morbid obesity ([place]) 12/08/2012   HTN (hypertension) 10/11/2011   Varicose veins of bilateral lower extremities with other complications 97/35/3299   Palpitations 09/12/2010   FIBROIDS, UTERUS 04/03/2007   Allergic rhinitis 04/03/2007   Asthma, mild intermittent 04/03/2007    Past Surgical History:  Procedure Laterality Date   ABDOMINAL HYSTERECTOMY  Think  [place] Left 03/21/2022   Procedure: BIOPSY TEMPORAL ARTERY;  Surgeon: Katha Cabal, MD;  Location: [facility];  Service: Vascular;  Laterality: Left;   BIOPSY THYROID     08/2021   BREAST CYST EXCISION Right 1994   Benign   CESAREAN SECTION     x2   CHOLECYSTECTOMY  1995   COLONOSCOPY WITH PROPOFOL N/A 05/09/2021   Procedure: COLONOSCOPY WITH PROPOFOL;  Surgeon: [name], MD;  [facility];  Service: Gastroenterology;  Laterality: N/A;   EYE SURGERY  01/2016   Repair retinal tear   FOOT SURGERY  1995 / 1996   x2   HERNIA REPAIR     KNEE ARTHROSCOPY  W/ MENISCAL REPAIR  07/25/2013   right knee Dr. Mardelle Matte   LAPAROSCOPIC GASTRIC SLEEVE RESECTION N/A 06/08/2013   Procedure: LAPAROSCOPIC GASTRIC SLEEVE RESECTION AND EXCISION OF SEBACEUS CYST FROM MID CHEST takedown of incarcerated ventral hernia and primary repair endoscopy;  Surgeon: [name], MD;  Location: [facility];  Service: General;  Laterality: N/A;   LAPAROSCOPIC NISSEN FUNDOPLICATION N/A 82/99/3716   Procedure: LAPAROSCOPIC REPAIR LARGE SYMPTOMATIC HIATAL HERNIA WITH UPPER ENDOSCOPY;  Surgeon: Johnathan Hausen, MD;  Location: [facility];  Service: General;  Laterality: N/A;   moles  06/2013   removed 2 moles from under arm and  lowback   NECK SURGERY     occipital nerve damage- injections    OVARIAN CYST [place] EXCISIONAL BREAST BIOPSY Right 06/23/2021   Procedure: RADIOACTIVE SEED GUIDED EXCISIONAL RIGHT BREAST BIOPSY;  Surgeon: Johnathan Hausen, MD;  Location: [place];  Service: General;  Laterality: Right;   RETINAL TEAR REPAIR CRYOTHERAPY Right 01/2016   [place]   SKIN CANCER EXCISION     TONSILLECTOMY     TUBAL LIGATION  Think 1994   UPPER GI ENDOSCOPY  06/08/2013   Procedure: UPPER GI ENDOSCOPY;  Surgeon: [name], MD;  Location: [facility];  Service: General;;   US ECHOCARDIOGRAPHY  12/2011   WNL - EF 55-60%, mild MR, grade 1 diastolic dysnfiction (mild)   VENTRAL HERNIA REPAIR  2000    OB History     Gravida  3   Para  2   Term  2   Preterm      AB  1   Living  2      SAB  1   IAB      Ectopic      Multiple      Live Births  2            Home Medications    Prior to Admission medications   Medication Sig Start Date End Date Taking? Authorizing Provider  albuterol (VENTOLIN HFA) 108 (90 Base) MCG/ACT inhaler Inhale 2 puffs into the lungs every 6 (six) hours as needed for wheezing or shortness of breath. 10/10/21   Parrett, Fonnie Mu, NP  budesonide-formoterol  (SYMBICORT) 160-4.5 MCG/ACT inhaler Inhale 2 puffs into the lungs 2 (two) times daily. 10/12/21   Chesley Mires, MD  Calcium Citrate-Vitamin D (CITRACAL + D PO) Take 2 tablets by mouth daily.     [provider]  Cholecalciferol (VITAMIN D3) 1.25 MG (50000 UT) CAPS Take 1 capsule by mouth once a week. 09/05/21   Bedsole, Amy E, MD  Coenzyme Q10-Vitamin E (QUNOL ULTRA COQ10) 100-150 MG-UNIT CAPS Take 100 mg by  mouth daily.    [provider]  COLLAGEN PO Take 2,000 mg by mouth daily. Patient not taking: Reported on 03/20/2022    [provider]  Cyanocobalamin (VITAMIN B-12) 5000 MCG SUBL Place 5,000 mcg under the tongue daily.    [provider]  denosumab (PROLIA) 60 MG/ML SOSY injection  11/08/21   [provider]  diltiazem (CARDIZEM SR) 120 MG 12 hr capsule Take 1 capsule (120 mg total) by mouth 2 (two) times daily. 08/24/21   [name], MD  ELDERBERRY PO Take 1,000 mg by mouth daily.     [provider]  Emollient (COLLAGEN PREMIUM SKIN EX) Apply topically. Patient not taking: Reported on 03/20/2022    [provider]  famotidine (PEPCID) 40 MG tablet Take 40 mg by mouth at bedtime.    [provider]  fluticasone (FLONASE) 50 MCG/ACT nasal spray SPRAY 2 SPRAYS INTO EACH NOSTRIL EVERY DAY 10/15/21   Bedsole, Amy E, MD  gabapentin (NEURONTIN) 300 MG capsule Take 300 mg by mouth daily. 02/23/21   [provider]  HYDROcodone-acetaminophen (NORCO) 5-325 MG tablet Take 1-2 tablets by mouth every 6 (six) hours as needed for moderate pain or severe pain. 03/21/22   Schnier, Dolores Lory, MD  Lactobacillus Rhamnosus, GG, (CULTURELLE IMMUNITY SUPPORT PO) Take 1 capsule by mouth daily.    [provider]  Menaquinone-7 (VITAMIN K2 PO) Take 1 tablet by mouth daily.    [provider]  montelukast (SINGULAIR) 10 MG tablet TAKE ONE TABLET BY MOUTH AT BEDTIME 03/05/22   Chesley Mires, MD  Multiple  Vitamins-Minerals (CENTRUM SILVER 50+WOMEN PO) Take 1 tablet by mouth daily.    [provider]  nitroGLYCERIN (NITROSTAT) 0.4 MG SL tablet Place 1 tablet (0.4 mg total) under the tongue every 5 (five) minutes as needed for chest pain. 03/20/22   Bedsole, Amy E, MD  Omega-3 Fatty Acids (FISH OIL) 1200 MG CAPS Take 1 capsule by mouth daily.    [provider]  pantoprazole (PROTONIX) 40 MG tablet TAKE 1 TABLET BY MOUTH EVERY DAY 10/25/21   Bedsole, Amy E, MD  phenazopyridine (PYRIDIUM) 200 MG tablet Take 1 tablet (200 mg total) by mouth 3 (three) times daily as needed for pain. Patient not taking: Reported on 03/20/2022 02/02/22   Jaynee Eagles, PA-C  predniSONE (DELTASONE) 20 MG tablet Take 2 tablets (40 mg total) by mouth daily with breakfast. 03/21/22   Bedsole, Amy E, MD  Riboflavin 100 MG CAPS Take 1 capsule by mouth daily. 07/26/21   [provider]  rosuvastatin (CRESTOR) 10 MG tablet Take by mouth. 10/20/21   [provider]  traMADol (ULTRAM) 50 MG tablet Take 50 mg by mouth every 6 (six) hours as needed.    [provider]  TURMERIC PO Take 1 tablet by mouth daily.    [provider]    Family History Family History  Problem Relation Age of Onset   Breast cancer Mother    Diabetes Mother    Hypertension Mother    Kidney failure Mother    Diabetes Father    Hypertension Father    Diabetes Other    Hypertension Other    Stroke Other    Hemophilia Other     Social History Social History   Tobacco Use   Smoking status: Never   Smokeless tobacco: Never   Tobacco comments:    smoke at age 10-12  Vaping Use   Vaping Use: Never used  Substance  Use Topics   Alcohol use: Not Currently   Drug use: No     Allergies   Wasp venom, Chlorhexidine gluconate, Metronidazole, Prednisone, and Sodium hypochlorite   Review of Systems Review of Systems   Physical Exam Triage Vital Signs ED Triage Vitals  Enc Vitals Group     BP       Pulse      Resp      Temp      Temp src      SpO2      Weight      Height      Head Circumference      Peak Flow      Pain Score      Pain Loc      Pain Edu?      Excl. in [place]?    No data found.  Updated Vital Signs There were no vitals taken for this visit.  Visual Acuity Right Eye Distance:   Left Eye Distance:   Bilateral Distance:    Right Eye Near:   Left Eye Near:    Bilateral Near:     Physical Exam Vitals reviewed.  Constitutional:      Appearance: Normal appearance.  Cardiovascular:     Rate and Rhythm: Tachycardia present. Rhythm irregular.  Pulmonary:     Effort: Pulmonary effort is normal.     Breath sounds: Normal breath sounds.  Neurological:     General: No focal deficit present.     Mental Status: She is alert and oriented to person, place, and time.  Psychiatric:        Mood and Affect: Mood normal.        Behavior: Behavior normal.      UC Treatments / Results  Labs (all labs ordered are listed, but only abnormal results are displayed) Labs Reviewed - No data to display  EKG   Radiology No results found.  Procedures Procedures (including critical care time)  Medications Ordered in UC Medications - No data to display  Initial Impression / Assessment and Plan / UC Course  I have reviewed the triage vital signs and the nursing notes.  Pertinent labs & imaging results that were available during my care of the patient were reviewed by me and considered in my medical decision making (see chart for details).   EKG shows A-fib with RVR possibly secondary to prednisone.  Ventricular rate of 150 bpm.  Patient will be discharged to ED for emergent rate control versus cardioversion.   Final Clinical Impressions(s) / UC Diagnoses   Final diagnoses:  None   Discharge Instructions   None    ED Prescriptions   None    PDMP not reviewed this encounter.   Rose Phi, [place] 03/24/22 1009    ImmordinoAnnie Main,  [place] 03/24/22 1339

## 2022-03-24 NOTE — ED Provider Triage Note (Signed)
  Emergency Medicine Provider Triage Evaluation Note  Kimberly Patton , a 66 y.o.female,  was evaluated in triage.  Pt complains of tachycardia.  Patient states she woke up with elevated blood pressure and elevated heart rate.  She took an extra diltiazem.  Patient went to urgent care after calling her doctor and they did an EKG showing that she was in A-fib.  Patient also had a temporal artery biopsy recently and was started on prednisone.  She started tapering her prednisone today.  She has had an episode of A-fib in the past.  Current endorsing palpitations.   Review of Systems  Positive: Palpitations Negative: Denies fever, chest pain, vomiting  Physical Exam   Vitals:   03/24/22 1039  BP: 114/71  Pulse: 93  Resp: 18  Temp: 97.7 F (36.5 C)  SpO2: 97%   Gen:   Awake, no distress   Resp:  Normal effort  MSK:   Moves extremities without difficulty  Other:    Medical Decision Making  Given the patient's initial medical screening exam, the following diagnostic evaluation has been ordered. The patient will be placed in the appropriate treatment space, once one is available, to complete the evaluation and treatment. I have discussed the plan of care with the patient and I have advised the patient that an ED physician or mid-level practitioner will reevaluate their condition after the test results have been received, as the results may give them additional insight into the type of treatment they may need.    Diagnostics: Labs, EKG, CXR  Treatments: none immediately   Teodoro Spray, [place] 03/24/22 1219

## 2022-03-24 NOTE — ED Provider Notes (Signed)
[facility] Provider Note    Event Date/Time   First MD Initiated Contact with Patient 03/24/22 1321     (approximate)   History   Tachycardia   HPI  Kimberly Patton is a 66 y.o. female with history of atrial fibrillation who comes in with concerns for recurrent atrial fibrillation.  Patient was over at urgent care where her heart rate was noted to be elevated and EKG showed atrial fibrillation.  Patient does report taking her diltiazem SR 120 mg and did take an additional 120 mg before being evaluated at urgent care.  She does report being on steroids for temporal arteritis.  She denies any new chest pain, shortness of breath, abdominal pain.  She is got baseline lymphedema but denies anything that is worsening.  She reports feeling much better since being here.  Physical Exam   Triage Vital Signs: ED Triage Vitals  Enc Vitals Group     BP 03/24/22 1039 114/71     Pulse Rate 03/24/22 1039 93     Resp 03/24/22 1039 18     Temp 03/24/22 1039 97.7 F (36.5 C)     Temp Source 03/24/22 1039 Oral     SpO2 03/24/22 1039 97 %     Weight --      Height --      Head Circumference --      Peak Flow --      Pain Score 03/24/22 1036 0     Pain Loc --      Pain Edu? --      Excl. in [place]? --     Most recent vital signs: Vitals:   03/24/22 1039 03/24/22 1311  BP: 114/71 (!) 141/71  Pulse: 93 88  Resp: 18 18  Temp: 97.7 F (36.5 C)   SpO2: 97% 94%     General: Awake, no distress.  CV:  Good peripheral perfusion.  Resp:  Normal effort.  Clear lungs Abd:  No distention.  Soft nontender Other:  No pitting edema noted in her legs   ED Results / Procedures / Treatments   Labs (all labs ordered are listed, but only abnormal results are displayed) Labs Reviewed  BASIC METABOLIC PANEL - Abnormal; Notable for the following components:      Result Value   Glucose, Bld 126 (*)    BUN 24 (*)    All other components within normal limits  CBC -  Abnormal; Notable for the following components:   WBC 14.0 (*)    RDW 15.6 (*)    All other components within normal limits  TROPONIN I (HIGH SENSITIVITY)  TROPONIN I (HIGH SENSITIVITY)     EKG  My interpretation of EKG:  Normal sinus rhythm 92 without any ST elevation or T wave inversions, normal intervals  RADIOLOGY I have reviewed the xray personally and interpreted no evidence of any pneumonia pulmonary edema    PROCEDURES:  Critical Care performed: No  .1-3 Lead EKG Interpretation  Performed by: Vanessa Loco, MD Authorized by: Vanessa [place], MD     Interpretation: normal     ECG rate:  88   ECG rate assessment: normal     Rhythm: sinus rhythm     Ectopy: none     Conduction: normal      MEDICATIONS ORDERED IN ED: Medications - No data to display   IMPRESSION / MDM / [place] / ED COURSE  I reviewed the triage vital signs  and the nursing notes.   Patient's presentation is most consistent with acute presentation with potential threat to life or bodily function.   Patient came in with atrial fibrillation with RVR however after taking her own home extra diltiazem dose she is back into normal sinus.  Labs to be ordered evaluate for Electra abnormalities, AKI.  She denies any shortness of breath recheck oxygen is normal.  Low suspicion for a fib.  No new calf swelling or calf pain to suggest DVT.  BMP shows normal potassium.  Troponins are negative x2.  CBC shows slightly elevated white count but patient is on steroids.  Patient's been in the ER for over 4 hours and is on the cardiac monitor and still remains in normal sinus.  We discussed reassuring work-up and discharge home.  Patient's chadvasc is 3-recommend anticoagulation   Discussed with patient going up on long-acting Cardizem but her heart rates at baseline are in the 60s so we will do a pill in the pocket short acting diltiazem 30 mg as needed with additional 1 dose if not getting better  after an hour and she can return to the ER if that still not getting better.  She expressed understanding also understands though that if its going really fast and she does not feel well she can always come into the ER regardless which she expressed understanding.  Discussed with patient blood thinner and the risk for bleeding.  She understands these risk but denies any GI bleeding or recurrent recent falls.  She would like to proceed with Eliquis.  Discussed with vascular doctor Esco and she is okay with proceeding with Eliquis given biopsy was 3 days ago.  Considered admission but given heart rates have been normal sinus for over 4 hours and patient is asymptomatic at this time she feels comfortable with discharge home with follow-up with her cardiologist  The patient is on the cardiac monitor to evaluate for evidence of arrhythmia and/or significant heart rate changes.      FINAL CLINICAL IMPRESSION(S) / ED DIAGNOSES   Final diagnoses:  Atrial fibrillation with RVR ([place])     Rx / DC Orders   ED Discharge Orders          Ordered    diltiazem (CARDIZEM) 30 MG tablet  Daily PRN        03/24/22 1419    apixaban (ELIQUIS) 5 MG TABS tablet  2 times daily        03/24/22 1421             Note:  This document was prepared using Dragon voice recognition software and may include unintentional dictation errors.   Vanessa [place], MD 03/24/22 [phone]

## 2022-03-24 NOTE — ED Triage Notes (Signed)
Pt states that she woke up with tachycardia and high BP- pt took an extra diltiazem- pt states she went to UC after calling her dr and they did an EKG and she was in a-fib- pt also had a temporal artery biopsy and was started on prednisone- pt states she started tapering her predisone today- pt states she has had an episode of afib before in the past

## 2022-03-24 NOTE — ED Triage Notes (Signed)
Pt. States this morning she had an increased heart rate and increased blood pressure. Pt. States she reached out to her heart specialist and they recommend take an additional dose of diltiazem and come into UC. Pt. Also mentions having a temporal artery biopsy and being started on Prednisone Wednesday. Pt expresses concern on how the prednisone is affecting her. Pt. Has started to have her prednisone dose tapered.

## 2022-03-24 NOTE — Telephone Encounter (Signed)
   Recent bx for temporal arteritis and has been on prednisone.  When she awoke this AM she noted tachypalpitations.  She took her BP and it was elevated in the 170's w/ a HR in the 140's.  She took her morning meds (dilt SR '120mg'$ ).  Currently BP 140/108 with a heart rate of 85.  She says that the heart on her blood pressure monitor is flashing and she is not sure what that means.  We discussed that some blood pressure monitors do have an irregular heart rhythm detection.  I recommended that she take an additional 120 mg of diltiazem SR this morning.  If at any point she is feeling poorly, continues to note irregular heartbeats, or ongoing elevated blood pressures, she should be evaluated in urgent care this morning for at a minimum, an ECG.  Caller verbalized understanding and was grateful for the call back.  Murray Hodgkins, NP 03/24/2022, 8:50 AM

## 2022-03-24 NOTE — ED Notes (Signed)
Pt appearing distressed about current wait times. Pt explained that she will be roomed as soon as one is available. Pt VS updated and repeat trop collected. Pt assisted back to [place]

## 2022-03-24 NOTE — Discharge Instructions (Addendum)
Your EKG shows that your heart is in atrial fibrillation with rapid ventricular response.  You are being discharged to the ED where you will receive treatment for rate control.  These follow-up with your CP and cardiologist after discharge from the ED.

## 2022-03-26 ENCOUNTER — Encounter (INDEPENDENT_AMBULATORY_CARE_PROVIDER_SITE_OTHER): Payer: Self-pay | Admitting: Vascular Surgery

## 2022-03-26 ENCOUNTER — Telehealth (INDEPENDENT_AMBULATORY_CARE_PROVIDER_SITE_OTHER): Payer: Self-pay | Admitting: Vascular Surgery

## 2022-03-26 ENCOUNTER — Telehealth: Payer: Self-pay

## 2022-03-26 ENCOUNTER — Ambulatory Visit (INDEPENDENT_AMBULATORY_CARE_PROVIDER_SITE_OTHER): Payer: Medicare Other | Admitting: Vascular Surgery

## 2022-03-26 VITALS — BP 136/81 | HR 85 | Resp 16

## 2022-03-26 DIAGNOSIS — G4486 Cervicogenic headache: Secondary | ICD-10-CM

## 2022-03-26 NOTE — Telephone Encounter (Signed)
Per chart review tab pt was seen on 03/24/22 at [facility] ED. Sending note to Dr Diona Browner and Butch Penny CMA.

## 2022-03-26 NOTE — Telephone Encounter (Signed)
Patient called in stating that she wanted some advise or to come into the office to be seen. Patient states where she had biopsy done the area is very (TIGHT AND HARD) she doesn't know if that is ok if something is wrong but she wants some one to let her know. She states she had to go to the ER over the weekend because of being on Prednisone it made her go into A Fib. She worried and just wants someone to call her back     Please call and advise

## 2022-03-26 NOTE — Progress Notes (Unsigned)
MRN : [id number]  Kimberly Patton is a 66 y.o. (12/15/1955) female who presents with chief complaint of check circulation.  History of Present Illness: ***  Current Meds  Medication Sig   albuterol (VENTOLIN HFA) 108 (90 Base) MCG/ACT inhaler Inhale 2 puffs into the lungs every 6 (six) hours as needed for wheezing or shortness of breath.   apixaban (ELIQUIS) 5 MG TABS tablet Take 1 tablet (5 mg total) by mouth 2 (two) times daily.   budesonide-formoterol (SYMBICORT) 160-4.5 MCG/ACT inhaler Inhale 2 puffs into the lungs 2 (two) times daily.   Calcium Citrate-Vitamin D (CITRACAL + D PO) Take 2 tablets by mouth daily.    Cholecalciferol (VITAMIN D3) 1.25 MG (50000 UT) CAPS Take 1 capsule by mouth once a week.   Coenzyme Q10-Vitamin E (QUNOL ULTRA COQ10) 100-150 MG-UNIT CAPS Take 100 mg by mouth daily.   Cyanocobalamin (VITAMIN B-12) 5000 MCG SUBL Place 5,000 mcg under the tongue daily.   diltiazem (CARDIZEM SR) 120 MG 12 hr capsule Take 1 capsule (120 mg total) by mouth 2 (two) times daily.   diltiazem (CARDIZEM) 30 MG tablet Take 1 tablet (30 mg total) by mouth daily as needed (for elevated Heart rate).   ELDERBERRY PO Take 1,000 mg by mouth daily.    famotidine (PEPCID) 40 MG tablet Take 40 mg by mouth at bedtime.   fluticasone (FLONASE) 50 MCG/ACT nasal spray SPRAY 2 SPRAYS INTO EACH NOSTRIL EVERY DAY   gabapentin (NEURONTIN) 300 MG capsule Take 300 mg by mouth daily.   HYDROcodone-acetaminophen (NORCO) 5-325 MG tablet Take 1-2 tablets by mouth every 6 (six) hours as needed for moderate pain or severe pain.   Lactobacillus Rhamnosus, GG, (CULTURELLE IMMUNITY SUPPORT PO) Take 1 capsule by mouth daily.   Menaquinone-7 (VITAMIN K2 PO) Take 1 tablet by mouth daily.   montelukast (SINGULAIR) 10 MG tablet TAKE ONE TABLET BY MOUTH AT BEDTIME   Multiple Vitamins-Minerals (CENTRUM SILVER 50+WOMEN PO) Take 1 tablet by mouth daily.   nitroGLYCERIN (NITROSTAT) 0.4 MG SL tablet  Place 1 tablet (0.4 mg total) under the tongue every 5 (five) minutes as needed for chest pain.   Omega-3 Fatty Acids (FISH OIL) 1200 MG CAPS Take 1 capsule by mouth daily.   pantoprazole (PROTONIX) 40 MG tablet TAKE 1 TABLET BY MOUTH EVERY DAY   predniSONE (DELTASONE) 20 MG tablet Take 2 tablets (40 mg total) by mouth daily with breakfast.   Riboflavin 100 MG CAPS Take 1 capsule by mouth daily.   rosuvastatin (CRESTOR) 10 MG tablet Take by mouth.   traMADol (ULTRAM) 50 MG tablet Take 50 mg by mouth every 6 (six) hours as needed.   TURMERIC PO Take 1 tablet by mouth daily.    Past Medical History:  Diagnosis Date   ALLERGIC RHINITIS 04/03/2007   Allergy    Not sure   Anemia    ((Pt Qnr Sub: Denies at visit from 08/28/2021 with endocrinology))    Anginal pain ([place])    Anxiety    Arthritis Not sure   Asthma    hx of years ago - no longer a problem    ASTHMA, PERSISTENT, MODERATE 04/03/2007   Cancer ([place])    skin    Dyspnea on exertion    a. 11/2007 Echo: EF 60%.   Dysrhythmia    hx of heart arrhythmia 10-15 years ago - followed by DR Lovena Le - not seen  in years    GERD (gastroesophageal reflux disease)    Hemophilia carrier    Hypertension    Joint pain    HIPS / LEGS   Knee injury    RT   Meningioma ([place])    Morbid obesity ([place])    Neuromuscular disorder ([place]) Not sure, since my fall   Paroxysmal SVT (supraventricular tachycardia)    a. 11/2011 48h Holter: RSR, rare PVC's, occas PAC's   Pre-diabetes    Retinal tear of right eye 01/2016   Ventral hernia     Past Surgical History:  Procedure Laterality Date   [place]   ARTERY BIOPSY Left 03/21/2022   Procedure: BIOPSY TEMPORAL ARTERY;  Surgeon: Katha Cabal, MD;  Location: [facility] ORS;  Service: Vascular;  Laterality: Left;   BIOPSY THYROID     08/2021   BREAST CYST EXCISION Right 1994   Benign   CESAREAN SECTION     x2   CHOLECYSTECTOMY  1995   COLONOSCOPY WITH  PROPOFOL N/A 05/09/2021   Procedure: COLONOSCOPY WITH PROPOFOL;  Surgeon: [name], MD;  Location: [facility] ENDOSCOPY;  Service: Gastroenterology;  Laterality: N/A;   EYE SURGERY  01/2016   Repair retinal tear   FOOT SURGERY  1995 / 1996   x2   HERNIA REPAIR     KNEE ARTHROSCOPY W/ MENISCAL REPAIR  07/25/2013   right knee Dr. Mardelle Matte   LAPAROSCOPIC GASTRIC SLEEVE RESECTION N/A 06/08/2013   Procedure: LAPAROSCOPIC GASTRIC SLEEVE RESECTION AND EXCISION OF SEBACEUS CYST FROM MID CHEST takedown of incarcerated ventral hernia and primary repair endoscopy;  Surgeon: [name], MD;  Location: [facility] ORS;  Service: General;  Laterality: N/A;   LAPAROSCOPIC NISSEN FUNDOPLICATION N/A 21/19/4174   Procedure: LAPAROSCOPIC REPAIR LARGE SYMPTOMATIC HIATAL HERNIA WITH UPPER ENDOSCOPY;  Surgeon: Johnathan Hausen, MD;  Location: [facility] ORS;  Service: General;  Laterality: N/A;   moles  06/2013   removed 2 moles from under arm and  lowback   NECK SURGERY     occipital nerve damage- injections    OVARIAN CYST Andrew EXCISIONAL BREAST BIOPSY Right 06/23/2021   Procedure: RADIOACTIVE SEED GUIDED EXCISIONAL RIGHT BREAST BIOPSY;  Surgeon: Johnathan Hausen, MD;  Location: [place];  Service: General;  Laterality: Right;   RETINAL TEAR REPAIR CRYOTHERAPY Right 01/2016   [place]   SKIN CANCER EXCISION     TONSILLECTOMY     TUBAL LIGATION  Think 1994   UPPER GI ENDOSCOPY  06/08/2013   Procedure: UPPER GI ENDOSCOPY;  Surgeon: [name], MD;  Location: [facility] ORS;  Service: General;;   US ECHOCARDIOGRAPHY  12/2011   WNL - EF 55-60%, mild MR, grade 1 diastolic dysnfiction (mild)   VENTRAL HERNIA REPAIR  2000    Social History Social History   Tobacco Use   Smoking status: Never   Smokeless tobacco: Never   Tobacco comments:    smoke at age 85-12  Vaping Use   Vaping Use: Never used  Substance Use Topics   Alcohol use: Not  Currently   Drug use: No    Family History Family History  Problem Relation Age of Onset   Breast cancer Mother    Diabetes Mother    Hypertension Mother    Kidney failure Mother    Diabetes Father    Hypertension Father    Diabetes Other  Hypertension Other    Stroke Other    Hemophilia Other     Allergies  Allergen Reactions   Wasp Venom Anaphylaxis   Chlorhexidine Gluconate     Itching under breasts   Metronidazole Other (See Comments)    Chest tightness, neck tightness   Prednisone Other (See Comments)    Severe migraine / with taper dose   Sodium Hypochlorite Rash    Liquid clorox bleach     REVIEW OF SYSTEMS (Negative unless checked)  Constitutional: '[]'$ Weight loss  '[]'$ Fever  '[]'$ Chills Cardiac: '[]'$ Chest pain   '[]'$ Chest pressure   '[]'$ Palpitations   '[]'$ Shortness of breath when laying flat   '[]'$ Shortness of breath with exertion. Vascular:  '[x]'$ Pain in legs with walking   '[]'$ Pain in legs at rest  '[]'$ History of DVT   '[]'$ Phlebitis   '[]'$ Swelling in legs   '[]'$ Varicose veins   '[]'$ Non-healing ulcers Pulmonary:   '[]'$ Uses home oxygen   '[]'$ Productive cough   '[]'$ Hemoptysis   '[]'$ Wheeze  '[]'$ COPD   '[]'$ Asthma Neurologic:  '[]'$ Dizziness   '[]'$ Seizures   '[]'$ History of stroke   '[]'$ History of TIA  '[]'$ Aphasia   '[]'$ Vissual changes   '[]'$ Weakness or numbness in arm   '[]'$ Weakness or numbness in leg Musculoskeletal:   '[]'$ Joint swelling   '[]'$ Joint pain   '[]'$ Low back pain Hematologic:  '[]'$ Easy bruising  '[]'$ Easy bleeding   '[]'$ Hypercoagulable state   '[]'$ Anemic Gastrointestinal:  '[]'$ Diarrhea   '[]'$ Vomiting  '[]'$ Gastroesophageal reflux/heartburn   '[]'$ Difficulty swallowing. Genitourinary:  '[]'$ Chronic kidney disease   '[]'$ Difficult urination  '[]'$ Frequent urination   '[]'$ Blood in urine Skin:  '[]'$ Rashes   '[]'$ Ulcers  Psychological:  '[]'$ History of anxiety   '[]'$  History of major depression.  Physical Examination  Vitals:   03/26/22 1610  BP: 136/81  Pulse: 85  Resp: 16   There is no height or weight on file to calculate BMI. Gen: WD/WN,  NAD Head: [place]/AT, No temporalis wasting.  Ear/Nose/Throat: Hearing grossly intact, nares w/o erythema or drainage Eyes: PER, EOMI, sclera nonicteric.  Neck: Supple, no masses.  No bruit or JVD.  Pulmonary:  Good air movement, no audible wheezing, no use of accessory muscles.  Cardiac: RRR, normal S1, S2, no Murmurs. Vascular:  mild trophic changes, no open wounds Vessel Right Left  Radial Palpable Palpable  PT Not Palpable Not Palpable  DP Not Palpable Not Palpable  Gastrointestinal: soft, non-distended. No guarding/no peritoneal signs.  Musculoskeletal: M/S 5/5 throughout.  No visible deformity.  Neurologic: CN 2-12 intact. Pain and light touch intact in extremities.  Symmetrical.  Speech is fluent. Motor exam as listed above. Psychiatric: Judgment intact, Mood & affect appropriate for pt's clinical situation. Dermatologic: No rashes or ulcers noted.  No changes consistent with cellulitis.   CBC Lab Results  Component Value Date   WBC 14.0 (H) 03/24/2022   HGB 13.2 03/24/2022   HCT 42.7 03/24/2022   MCV 84.1 03/24/2022   PLT 297 03/24/2022    BMET    Component Value Date/Time   NA 139 03/24/2022 1040   NA 139 01/21/2020 1137   K 3.8 03/24/2022 1040   CL 104 03/24/2022 1040   CO2 26 03/24/2022 1040   GLUCOSE 126 (H) 03/24/2022 1040   BUN 24 (H) 03/24/2022 1040   BUN 16 01/21/2020 1137   CREATININE 0.70 03/24/2022 1040   CREATININE 0.57 01/01/2020 1623   CALCIUM 8.9 03/24/2022 1040   GFRNONAA >60 03/24/2022 1040   GFRAA >60 02/02/2020 1417   Estimated Creatinine Clearance: 95.3 mL/min (by C-G formula based on  SCr of 0.7 mg/dL).  COAG Lab Results  Component Value Date   INR 1.0 12/03/2007   INR 1.0 11/25/2007    Radiology DG Chest 2 View  Result Date: 03/24/2022 CLINICAL DATA:  Tachycardia and hypertension. EXAM: CHEST - 2 VIEW COMPARISON:  09/21/2021 FINDINGS: Low volume film. The cardio pericardial silhouette is enlarged. The lungs are clear without focal  pneumonia, edema, pneumothorax or pleural effusion. There is pulmonary vascular congestion without overt pulmonary edema. The visualized bony structures of the thorax are unremarkable. IMPRESSION: No active cardiopulmonary disease. Electronically Signed   By: Misty Stanley M.D.   On: 03/24/2022 11:00   MR Brain W Wo Contrast  Result Date: 03/09/2022 CLINICAL DATA:  Headache, new or worsening (Age >= 50y) Brain/CNS neoplasm, monitor EXAM: MRI HEAD WITHOUT AND WITH CONTRAST TECHNIQUE: Multiplanar, multiecho pulse sequences of the brain and surrounding structures were obtained without and with intravenous contrast. CONTRAST:  10 mL VueWay IV COMPARISON:  MRI 06/02/2020. FINDINGS: Brain: Unchanged size/appearance of a 2.0 x 2.3 cm extra-axial dural-based enhancing mass along the high left frontal convexity at the vertex (series 18, image 137). Additional 5 mm focus of dural-based enhancement along the right frontal cortex (series 18, image 122) is also unchanged. No significant mass effect or brain edema. No evidence of acute infarct, acute hemorrhage, midline shift, or hydrocephalus. Partially empty sella. Vascular: Major arterial flow voids are maintained skull base. Skull and upper cervical spine: Normal marrow signal. Sinuses/Orbits: Clear sinuses.  No acute orbital findings. Other: No mastoid effusions. IMPRESSION: 1. No evidence of acute intracranial abnormality. 2. Unchanged size/appearance of a 2.3 cm putative meningioma along the high left frontal vertex. 3. Possible additional 6 mm meningioma along the right frontal convexity, also unchanged. 4. Partially empty sella, which is often a normal anatomic variant but can be associated with idiopathic intracranial hypertension. Electronically Signed   By: Margaretha Sheffield M.D.   On: 03/09/2022 17:07     Assessment/Plan There are no diagnoses linked to this encounter.   Hortencia Pilar, MD  03/26/2022 4:29 PM

## 2022-03-26 NOTE — Telephone Encounter (Signed)
Schnier said he can see her to take a quick look

## 2022-03-26 NOTE — Telephone Encounter (Signed)
Patient has been scheduled for today 03/26/2022 @ 4:30 per DrMarland Kitchen Delana Meyer

## 2022-03-26 NOTE — Anesthesia Postprocedure Evaluation (Signed)
Anesthesia Post Note  Patient: Kimberly Patton  Procedure(s) Performed: BIOPSY TEMPORAL ARTERY (Left: Head)  Patient location during evaluation: PACU Anesthesia Type: General Level of consciousness: awake and alert Pain management: pain level controlled Vital Signs Assessment: post-procedure vital signs reviewed and stable Respiratory status: spontaneous breathing, nonlabored ventilation, respiratory function stable and patient connected to nasal cannula oxygen Cardiovascular status: blood pressure returned to baseline and stable Postop Assessment: no apparent nausea or vomiting Anesthetic complications: no   No notable events documented.   Last Vitals:  Vitals:   03/21/22 0915 03/21/22 0933  BP: 103/60 118/62  Pulse: 61 62  Resp: 20 20  Temp: 36.6 C (!) 36.1 C  SpO2: 99% 100%    Last Pain:  Vitals:   03/22/22 1025  TempSrc:   PainSc: 0-No pain                 Martha Clan

## 2022-03-26 NOTE — Telephone Encounter (Signed)
[facility] - Client TELEPHONE ADVICE RECORD [facility] Patient Name: Kimberly Patton Gender: Female DOB: [birth year] Age: 66 Y 38 M 25 D Return Phone Number: [phone] (Primary) Address: City/ State/ Zip: [place]  [address] Client [facility] - Client Client Site [place] Provider [name] - MD Contact Type Call Who Is Calling Patient / Member / Family / Caregiver Call Type Triage / Clinical Relationship To Patient Self Return Phone Number [phone] (Primary) Chief Complaint Blood Pressure High Reason for Call Symptomatic / Request for [place] states her s/s heart rate is 145, BP 170/108. She's taking a new Rx of prednisone. Translation No Nurse Assessment Nurse: Fredderick Phenix, RN, Lelan Pons Date/Time (Eastern Time): 03/24/2022 8:29:48 AM Confirm and document reason for call. If symptomatic, describe symptoms. ---Caller states her s/s heart rate is 145, BP 170/108. She's taking a course of prednisone. Does the patient have any new or worsening symptoms? ---Yes Will a triage be completed? ---Yes Related visit to physician within the last 2 weeks? ---No Does the PT have any chronic conditions? (i.e. diabetes, asthma, this includes High risk factors for pregnancy, etc.) ---Yes List chronic conditions. ---HTN, cardiac hx Is this a behavioral health or substance abuse call? ---No Guidelines Guideline Title Affirmed Question Affirmed Notes Nurse Date/Time (Eastern Time) Blood Pressure - High [8] Systolic BP >= 588 OR Diastolic >= 502 AND [7] cardiac (e.g., breathing difficulty, chest pain) or neurologic symptoms (e.g., new-onset blurred or double vision, unsteady gait) Fredderick Phenix, RN, Lelan Pons 03/24/2022 8:31:43 AM PLEASE NOTE: All timestamps contained within this report are represented as [place] Standard Time. CONFIDENTIALTY NOTICE: This  fax transmission is intended only for the addressee. It contains information that is legally privileged, confidential or otherwise protected from use or disclosure. If you are not the intended recipient, you are strictly prohibited from reviewing, disclosing, copying using or disseminating any of this information or taking any action in reliance on or regarding this information. If you have received this fax in error, please notify us immediately by telephone so that we can arrange for its return to [place]. Phone: [phone], Toll-Free: [phone], Fax: [phone] Page: 2 of 2 Call Id: [id number] [place]. Time Eilene Ghazi Time) Disposition Final User 03/24/2022 8:33:25 AM Go to ED Now Yes Fredderick Phenix, RN, Lelan Pons Final Disposition 03/24/2022 8:33:25 AM Go to ED Now Yes Fredderick Phenix, RN, [place] Disagree/Comply Comply Caller Understands Yes PreDisposition Call Doctor Care Advice Given Per Guideline GO TO ED NOW: * You need to be seen in the Emergency Department. * Leave now. Drive carefully. * Another adult should drive. NOTE TO TRIAGER - DRIVING: CARE ADVICE given per High Blood Pressure (Adult) guideline. Referrals GO TO FACILITY UNDECIDE

## 2022-03-27 ENCOUNTER — Other Ambulatory Visit: Payer: Self-pay | Admitting: Pulmonary Disease

## 2022-03-27 MED ORDER — FUROSEMIDE 20 MG PO TABS: 20.0000 mg | ORAL_TABLET | Freq: Every day | ORAL | 0 refills | Status: DC | PRN

## 2022-03-27 MED ORDER — POTASSIUM CHLORIDE CRYS ER 20 MEQ PO TBCR: 20.0000 meq | EXTENDED_RELEASE_TABLET | Freq: Every day | ORAL | 0 refills | Status: DC | PRN

## 2022-03-27 NOTE — Addendum Note (Signed)
Addended by: [name] E on: 03/27/2022 08:59 AM   Modules accepted: Orders

## 2022-03-28 ENCOUNTER — Ambulatory Visit (INDEPENDENT_AMBULATORY_CARE_PROVIDER_SITE_OTHER): Payer: Medicare Other | Admitting: Family Medicine

## 2022-03-28 ENCOUNTER — Telehealth (INDEPENDENT_AMBULATORY_CARE_PROVIDER_SITE_OTHER): Payer: Self-pay

## 2022-03-28 ENCOUNTER — Encounter (INDEPENDENT_AMBULATORY_CARE_PROVIDER_SITE_OTHER): Payer: Self-pay | Admitting: Vascular Surgery

## 2022-03-28 VITALS — BP 140/84 | HR 74 | Temp 97.6°F | Ht 66.0 in

## 2022-03-28 DIAGNOSIS — R519 Headache, unspecified: Secondary | ICD-10-CM | POA: Diagnosis not present

## 2022-03-28 DIAGNOSIS — I480 Paroxysmal atrial fibrillation: Secondary | ICD-10-CM | POA: Diagnosis not present

## 2022-03-28 DIAGNOSIS — E236 Other disorders of pituitary gland: Secondary | ICD-10-CM | POA: Insufficient documentation

## 2022-03-28 DIAGNOSIS — I10 Essential (primary) hypertension: Secondary | ICD-10-CM

## 2022-03-28 MED ORDER — PREDNISONE 5 MG PO TABS: ORAL_TABLET | ORAL | 0 refills | Status: DC

## 2022-03-28 NOTE — Patient Instructions (Addendum)
Do not take Eliquis. Start ASA 81 mg daily.  Wean off prednisone .. tomorrow 10 mg x 2 days, then 5 mg x 2 day then 2.5 mg x 2 day then off.  Call if severe headache return or vision change.  Follow BP at home.Marland Kitchen goal < 140/90.  Hold off on lasix until you see if swelling is improving off prednisone.  Referral pending for  rheumatology.

## 2022-03-28 NOTE — Assessment & Plan Note (Signed)
Possible temporal arteritis.  Now the patient states her headache has not clearly improved on the prednisone and she is having significant side effects to prednisone and we will speed up her taper.  We will speed up her prednisone taper even further and have her off prednisone over the next 6 days.  If her temporal headache increases or any visual changes she will let me know ASAP.  We will keep the referral to rheumatology in place for further recommendations.

## 2022-03-28 NOTE — Assessment & Plan Note (Signed)
Chronic, usually well controlled with diltiazem 120 mg ER daily.  This is likely worsened due to the prednisone and given we are tapering her off she will just continue to use additional 30 mg diltiazem as needed.

## 2022-03-28 NOTE — Telephone Encounter (Signed)
I put the orders in for the Lasix and potassium.  Yesterday when I spoke with Dr. Diona Browner she wants to get a duplex ultrasound of the right temporal artery.  It can be at [facility].  Just check with Einar Pheasant as to how we should put that in.  You can let her know cardiology said that his office would be calling her to make an appointment. All medical advice per Dr Delana Meyer. Patient was made aware with medical advice and verbalized understanding on 03/27/22

## 2022-03-28 NOTE — Progress Notes (Signed)
Patient ID: Kimberly Patton, female    DOB: 1956/05/03, 66 y.o.   MRN: [id number]  This visit was conducted in person.  BP (!) 140/84   Pulse 74   Temp 97.6 F (36.4 C) (Temporal)   Ht '5\' 6"'$  (1.676 m)   SpO2 99%   BMI 46.01 kg/m    CC: Chief Complaint  Patient presents with   Hypertension    Subjective:   HPI: Kimberly Patton is a 66 y.o. female presenting on 03/28/2022 for Hypertension  She was started on prednisone on September 29 at 40 mg daily for possible temporal arteritis.  Her temporal artery biopsy returned negative 03/21/22 but initially she had reported that her headache had improved... So we had her continue 40 mg of prednisone for expected 4 to 6-week course. Unfortunately she had significant side effects to prior [place] including anxiety and shaking.  She also started to doubt that her temporal pain improved at all.  She went to the urgent care on October 14th with elevated blood pressure and new onset atrial fibrillation with rapid ventricular rate in 140-150s. By the time she was transferred to the emergency room she was back in sinus rhythm. Rate was controlled with diltiazem.  Eliquis was recommended but she never started given her vascular doctor stated it was too soon following her temporal artery biopsy to begin this.  She has not had no further spells of atrial fibrillation   She is currently on prednisone 20 mg daily ( on day 2 of this) .... Has decreased faster than instructed initially given significant side effects and possibly triggering atrial fibrillation   HAD another spell of increased HR 170/98 last night.. HR  was regualr, no higher than 100.  Hypertension:    On diltiazem SR 120 mg daily. BP Readings from Last 3 Encounters:  03/28/22 (!) 140/84  03/26/22 136/81  03/24/22 110/70  Using medication without problems or lightheadedness:  none Chest pain with exertion: none Edema: yes Short of breath: Average home BPs: Other issues:    Appt 5 days for now with Cardiology PA. Per patient on-call to cardiology they recommended that I handle the anticoagulant question.     Relevant past medical, surgical, family and social history reviewed and updated as indicated. Interim medical history since our last visit reviewed. Allergies and medications reviewed and updated. Outpatient Medications Prior to Visit  Medication Sig Dispense Refill   albuterol (VENTOLIN HFA) 108 (90 Base) MCG/ACT inhaler Inhale 2 puffs into the lungs every 6 (six) hours as needed for wheezing or shortness of breath. 8 g 2   budesonide-formoterol (SYMBICORT) 160-4.5 MCG/ACT inhaler Inhale 2 puffs into the lungs 2 (two) times daily. 1 each 5   Calcium Citrate-Vitamin D (CITRACAL + D PO) Take 2 tablets by mouth daily.      Cholecalciferol (VITAMIN D3) 1.25 MG (50000 UT) CAPS Take 1 capsule by mouth once a week. 12 capsule 3   Coenzyme Q10-Vitamin E (QUNOL ULTRA COQ10) 100-150 MG-UNIT CAPS Take 100 mg by mouth daily.     Cyanocobalamin (VITAMIN B-12) 5000 MCG SUBL Place 5,000 mcg under the tongue daily.     denosumab (PROLIA) 60 MG/ML SOSY injection      diltiazem (CARDIZEM SR) 120 MG 12 hr capsule Take 1 capsule (120 mg total) by mouth 2 (two) times daily. 60 capsule 9   diltiazem (CARDIZEM) 30 MG tablet Take 1 tablet (30 mg total) by mouth daily as needed (for elevated Heart rate).  30 tablet 0   ELDERBERRY PO Take 1,000 mg by mouth daily.      Emollient (COLLAGEN PREMIUM SKIN EX) Apply topically.     famotidine (PEPCID) 40 MG tablet Take 40 mg by mouth at bedtime.     fluticasone (FLONASE) 50 MCG/ACT nasal spray SPRAY 2 SPRAYS INTO EACH NOSTRIL EVERY DAY 48 mL 1   gabapentin (NEURONTIN) 300 MG capsule Take 300 mg by mouth daily.     Lactobacillus Rhamnosus, GG, (CULTURELLE IMMUNITY SUPPORT PO) Take 1 capsule by mouth daily.     Menaquinone-7 (VITAMIN K2 PO) Take 1 tablet by mouth daily.     montelukast (SINGULAIR) 10 MG tablet TAKE 1 TABLET BY MOUTH EVERYDAY  AT BEDTIME 90 tablet 1   Multiple Vitamins-Minerals (CENTRUM SILVER 50+WOMEN PO) Take 1 tablet by mouth daily.     nitroGLYCERIN (NITROSTAT) 0.4 MG SL tablet Place 1 tablet (0.4 mg total) under the tongue every 5 (five) minutes as needed for chest pain. 25 tablet 0   Omega-3 Fatty Acids (FISH OIL) 1200 MG CAPS Take 1 capsule by mouth daily.     pantoprazole (PROTONIX) 40 MG tablet TAKE 1 TABLET BY MOUTH EVERY DAY 90 tablet 1   predniSONE (DELTASONE) 20 MG tablet Take 2 tablets (40 mg total) by mouth daily with breakfast. 30 tablet 0   Riboflavin 100 MG CAPS Take 1 capsule by mouth daily.     rosuvastatin (CRESTOR) 10 MG tablet Take by mouth.     TURMERIC PO Take 1 tablet by mouth daily.     apixaban (ELIQUIS) 5 MG TABS tablet Take 1 tablet (5 mg total) by mouth 2 (two) times daily. (Patient not taking: Reported on 03/28/2022) 60 tablet 0   COLLAGEN PO Take 2,000 mg by mouth daily. (Patient not taking: Reported on 03/20/2022)     furosemide (LASIX) 20 MG tablet Take 1 tablet (20 mg total) by mouth daily as needed for edema. (Patient not taking: Reported on 03/28/2022) 10 tablet 0   potassium chloride SA (KLOR-CON M) 20 MEQ tablet Take 1 tablet (20 mEq total) by mouth daily as needed. (Patient not taking: Reported on 03/28/2022) 10 tablet 0   HYDROcodone-acetaminophen (NORCO) 5-325 MG tablet Take 1-2 tablets by mouth every 6 (six) hours as needed for moderate pain or severe pain. 20 tablet 0   phenazopyridine (PYRIDIUM) 200 MG tablet Take 1 tablet (200 mg total) by mouth 3 (three) times daily as needed for pain. 15 tablet 0   traMADol (ULTRAM) 50 MG tablet Take 50 mg by mouth every 6 (six) hours as needed.     No facility-administered medications prior to visit.     Per HPI unless specifically indicated in ROS section below Review of Systems  Constitutional:  Negative for fatigue and fever.  HENT:  Negative for congestion.   Eyes:  Negative for pain.  Respiratory:  Negative for cough and  shortness of breath.   Cardiovascular:  Negative for chest pain, palpitations and leg swelling.  Gastrointestinal:  Negative for abdominal pain.  Genitourinary:  Negative for dysuria and vaginal bleeding.  Musculoskeletal:  Negative for back pain.  Neurological:  Negative for syncope, light-headedness and headaches.  Psychiatric/Behavioral:  Negative for dysphoric mood.    Objective:  BP (!) 140/84   Pulse 74   Temp 97.6 F (36.4 C) (Temporal)   Ht '5\' 6"'$  (1.676 m)   SpO2 99%   BMI 46.01 kg/m   Wt Readings from Last 3 Encounters:  03/24/22 285 lb  0.9 oz (129.3 kg)  03/21/22 285 lb 0.9 oz (129.3 kg)  03/20/22 285 lb (129.3 kg)      Physical Exam Constitutional:      General: She is not in acute distress.    Appearance: Normal appearance. She is well-developed. She is obese. She is not ill-appearing or toxic-appearing.  HENT:     Head: Normocephalic.     Right Ear: Hearing, tympanic membrane, ear canal and external ear normal. Tympanic membrane is not erythematous, retracted or bulging.     Left Ear: Hearing, tympanic membrane, ear canal and external ear normal. Tympanic membrane is not erythematous, retracted or bulging.     Nose: No mucosal edema or rhinorrhea.     Right Sinus: No maxillary sinus tenderness or frontal sinus tenderness.     Left Sinus: No maxillary sinus tenderness or frontal sinus tenderness.     Mouth/Throat:     Pharynx: Uvula midline.  Eyes:     General: Lids are normal. Lids are everted, no foreign bodies appreciated.     Conjunctiva/sclera: Conjunctivae normal.     Pupils: Pupils are equal, round, and reactive to light.  Neck:     Thyroid: No thyroid mass or thyromegaly.     Vascular: No carotid bruit.     Trachea: Trachea normal.  Cardiovascular:     Rate and Rhythm: Normal rate and regular rhythm.     Pulses: Normal pulses.     Heart sounds: Normal heart sounds, S1 normal and S2 normal. No murmur heard.    No friction rub. No gallop.  Pulmonary:      Effort: Pulmonary effort is normal. No tachypnea or respiratory distress.     Breath sounds: Normal breath sounds. No decreased breath sounds, wheezing, rhonchi or rales.  Abdominal:     General: Bowel sounds are normal.     Palpations: Abdomen is soft.     Tenderness: There is no abdominal tenderness.  Musculoskeletal:     Cervical back: Normal range of motion and neck supple.  Skin:    General: Skin is warm and dry.     Findings: No rash.  Neurological:     Mental Status: She is alert.  Psychiatric:        Mood and Affect: Mood is not anxious or depressed.        Speech: Speech normal.        Behavior: Behavior normal. Behavior is cooperative.        Thought Content: Thought content normal.        Judgment: Judgment normal.       Results for orders placed or performed during the hospital encounter of 66/06/30  Basic metabolic panel  Result Value Ref Range   Sodium 139 135 - 145 mmol/L   Potassium 3.8 3.5 - 5.1 mmol/L   Chloride 104 98 - 111 mmol/L   CO2 26 22 - 32 mmol/L   Glucose, Bld 126 (H) 70 - 99 mg/dL   BUN 24 (H) 8 - 23 mg/dL   Creatinine, Ser 0.70 0.44 - 1.00 mg/dL   Calcium 8.9 8.9 - 10.3 mg/dL   GFR, Estimated >60 >60 mL/min   Anion gap 9 5 - 15  CBC  Result Value Ref Range   WBC 14.0 (H) 4.0 - 10.5 K/uL   RBC 5.08 3.87 - 5.11 MIL/uL   Hemoglobin 13.2 12.0 - 15.0 g/dL   HCT 42.7 36.0 - 46.0 %   MCV 84.1 80.0 - 100.0 fL  MCH 26.0 26.0 - 34.0 pg   MCHC 30.9 30.0 - 36.0 g/dL   RDW 15.6 (H) 11.5 - 15.5 %   Platelets 297 150 - 400 K/uL   nRBC 0.0 0.0 - 0.2 %  Troponin I (High Sensitivity)  Result Value Ref Range   Troponin I (High Sensitivity) 9 <18 ng/L  Troponin I (High Sensitivity)  Result Value Ref Range   Troponin I (High Sensitivity) 10 <18 ng/L     COVID 19 screen:  No recent travel or known exposure to COVID19 The patient denies respiratory symptoms of COVID 19 at this time. The importance of social distancing was discussed today.    Assessment and Plan Problem List Items Addressed This Visit     Empty sella syndrome ([place])    Noted incidentally on MRI brain.  Normal vitamin D, PTH and calcium levels.      Paroxysmal atrial fibrillation (HCC) - Primary    Acute, new It appears she has reverted and stayed in sinus rhythm.  This was likely triggered by prednisone, stress etc. She continues on Cardizem 120 mg daily. I think at this point there is no clear need for Eliquis.  She will start baby aspirin 81 mg daily as it is a week out from her temporal artery biopsy.  She will keep her follow-up appoint with cardiology as planned.      Primary hypertension    Chronic, usually well controlled with diltiazem 120 mg ER daily.  This is likely worsened due to the prednisone and given we are tapering her off she will just continue to use additional 30 mg diltiazem as needed.      Scalp tenderness    Possible temporal arteritis.  Now the patient states her headache has not clearly improved on the prednisone and she is having significant side effects to prednisone and we will speed up her taper.  We will speed up her prednisone taper even further and have her off prednisone over the next 6 days.  If her temporal headache increases or any visual changes she will let me know ASAP.  We will keep the referral to rheumatology in place for further recommendations.       Temple tenderness       Kimberly Lofts, MD

## 2022-03-28 NOTE — Assessment & Plan Note (Signed)
Noted incidentally on MRI brain.  Normal vitamin D, PTH and calcium levels. 

## 2022-03-28 NOTE — Assessment & Plan Note (Signed)
Acute, new It appears she has reverted and stayed in sinus rhythm.  This was likely triggered by prednisone, stress etc. She continues on Cardizem 120 mg daily. I think at this point there is no clear need for Eliquis.  She will start baby aspirin 81 mg daily as it is a week out from her temporal artery biopsy.  She will keep her follow-up appoint with cardiology as planned.

## 2022-03-30 ENCOUNTER — Ambulatory Visit (INDEPENDENT_AMBULATORY_CARE_PROVIDER_SITE_OTHER): Payer: Medicare Other

## 2022-03-30 ENCOUNTER — Encounter: Payer: Self-pay | Admitting: Cardiology

## 2022-03-30 ENCOUNTER — Ambulatory Visit: Payer: Medicare Other | Attending: Cardiology | Admitting: Cardiology

## 2022-03-30 VITALS — BP 132/82 | HR 74 | Ht 66.0 in | Wt 279.2 lb

## 2022-03-30 DIAGNOSIS — I10 Essential (primary) hypertension: Secondary | ICD-10-CM

## 2022-03-30 DIAGNOSIS — E7800 Pure hypercholesterolemia, unspecified: Secondary | ICD-10-CM

## 2022-03-30 DIAGNOSIS — I471 Supraventricular tachycardia, unspecified: Secondary | ICD-10-CM | POA: Diagnosis not present

## 2022-03-30 DIAGNOSIS — R600 Localized edema: Secondary | ICD-10-CM

## 2022-03-30 DIAGNOSIS — I480 Paroxysmal atrial fibrillation: Secondary | ICD-10-CM | POA: Diagnosis not present

## 2022-03-30 MED ORDER — FUROSEMIDE 20 MG PO TABS: 20.0000 mg | ORAL_TABLET | Freq: Every day | ORAL | 3 refills | Status: DC

## 2022-03-30 NOTE — Addendum Note (Signed)
Addended by: [name] E on: 03/30/2022 05:20 PM   Modules accepted: Orders

## 2022-03-30 NOTE — Patient Instructions (Signed)
Medication Instructions:     START taking your Furosemide (Lasix) 20 MG once a day.  *If you need a refill on your cardiac medications before your next appointment, please call your pharmacy*    Testing/Procedures:  Your physician has recommended that you wear a Zio XT monitor for 2 weeks. This will be mailed to your home address in 4-5 business days.   Your clinician has requested a Zio heart rhythm monitor by [employer] to be mailed to your home for you to wear for 14 days. You should expect a small box to arrive via USPS (or FedEx in some cases) within this next week. If you do not receive it please call [employer] at [phone].  Closely watching your heart at this time will help your care team understand more and provide information needed to develop your plan of care.  Please apply your Zio patch monitor the day you receive it. Keep this packaging, you will use this to return your Zio monitor.  You will easily be able to apply the monitor with the instructions provided in the Patient Guide.  If you need assistance, [employer] representatives are available 24/7 at [phone].  You can also download the [facility] app on your phone to view detailed application instructions and log symptoms.  After you wear your monitor for 14 days, place it back in the blue box or envelope, along with your Symptom Log.  To send your monitor back: Simply use the pre-addressed and pre-paid box/envelope.  Send it back through [employer] the same day you remove it via your local post office or by placing it in your mailbox.  As soon as we receive the results, they will be reviewed and your clinician will contact you.  For the first 24 hours- it is essential to not shower or exercise, to allow the patch to adhere to your skin. Avoid excessive sweating to help maximize wear time. Do not submerge the device, no hot tubs, and no swimming pools. Keep any lotions or oils away from the patch. After 24 hours  you may shower with the patch on. Take brief showers with your back facing the shower head.  Do not remove patch once it has been placed because that will interrupt data and decrease adhesive wear time. Push the button when you have any symptoms and write down what you were feeling. Once you have completed wearing your monitor, remove and place into box which has postage paid and place in your outgoing mailbox.  If for some reason you have misplaced your box then call our office and we can provide another box and/or mail it off for you.    Follow-Up: At [facility], you and your health needs are our priority.  As part of our continuing mission to provide you with exceptional heart care, we have created designated Provider Care Teams.  These Care Teams include your primary Cardiologist (physician) and Advanced Practice Providers (APPs -  Physician Assistants and Nurse Practitioners) who all work together to provide you with the care you need, when you need it.  We recommend signing up for the patient portal called "MyChart".  Sign up information is provided on this After Visit Summary.  MyChart is used to connect with patients for Virtual Visits (Telemedicine).  Patients are able to view lab/test results, encounter notes, upcoming appointments, etc.  Non-urgent messages can be sent to your provider as well.   To learn more about what you can do with MyChart, go to  [email or web address].    Your next appointment:   6 week(s)  The format for your next appointment:   In Person  Provider:    ONLY WITH [name], MD    Other Instructions   Important Information About Sugar

## 2022-03-30 NOTE — Progress Notes (Signed)
Cardiology Office Note:    Date:  03/30/2022   ID:  Kimberly Patton, DOB [birth year], MRN [id number]  PCP:  Jinny Sanders, MD  [facility] Cardiologist:  [name], MD  [facility] Electrophysiologist:  None   Referring MD: Jinny Sanders, MD   Chief Complaint  Patient presents with   Follow-up    ED 03-24-22, 10-20 had high pulse and soaked in sweat     History of Present Illness:    Kimberly Patton is a 66 y.o. female with a hx of hypertension, hiatal hernia, GERD, iron deficiency anemia, SVT on diltiazem presenting due to atrial fibrillation.  She had temporal artery biopsy about a week ago.  Has been on prednisone 40 mg daily for about 3 to 4 weeks prior.  3 days after her temporal artery biopsy, she noticed elevated heart rates prompting her to go to urgent care.  Diagnosed with atrial fibrillation, advised to go to the emergency room.  Upon arrival at the ED, EKG showed sinus rhythm.  She is currently on prednisone taper, 10 mg daily at this point.  Endorses edema, was given oral Lasix by vascular surgery, has not started yet.  Usually takes an extra short acting dose of diltiazem when her heart rates get elevated.   Prior notes Echocardiogram 01/2020 showed normal systolic and diastolic function, EF 60 to 65%.  Lexiscan Myoview 01/2020 with no evidence for ischemia.  Has a history of hypokalemia while taking HCTZ. Losartan was started after stopping HCTZ. Developed nausea with losartan. This was stopped.   Past Medical History:  Diagnosis Date   ALLERGIC RHINITIS 04/03/2007   Allergy    Not sure   Anemia    ((Pt Qnr Sub: Denies at visit from 08/28/2021 with endocrinology))    Anginal pain ([place])    Anxiety    Arthritis Not sure   Asthma    hx of years ago - no longer a problem    ASTHMA, PERSISTENT, MODERATE 04/03/2007   Cancer ([place])    skin    Dyspnea on exertion    a. 11/2007 Echo: EF 60%.   Dysrhythmia    hx of heart arrhythmia 10-15 years ago -  followed by DR Lovena Le - not seen in years    GERD (gastroesophageal reflux disease)    Hemophilia carrier    Hypertension    Joint pain    HIPS / LEGS   Knee injury    RT   Meningioma ([place])    Morbid obesity ([place])    Neuromuscular disorder ([place]) Not sure, since my fall   Paroxysmal SVT (supraventricular tachycardia)    a. 11/2011 48h Holter: RSR, rare PVC's, occas PAC's   Pre-diabetes    Retinal tear of right eye 01/2016   Ventral hernia     Past Surgical History:  Procedure Laterality Date   [place]   ARTERY BIOPSY Left 03/21/2022   Procedure: BIOPSY TEMPORAL ARTERY;  Surgeon: Katha Cabal, MD;  Location: [facility] ORS;  Service: Vascular;  Laterality: Left;   BIOPSY THYROID     08/2021   BREAST CYST EXCISION Right 1994   Benign   CESAREAN SECTION     x2   CHOLECYSTECTOMY  1995   COLONOSCOPY WITH PROPOFOL N/A 05/09/2021   Procedure: COLONOSCOPY WITH PROPOFOL;  Surgeon: [name], MD;  Location: [facility] ENDOSCOPY;  Service: Gastroenterology;  Laterality: N/A;   EYE SURGERY  01/2016   Repair  retinal tear   FOOT SURGERY  1995 / 1996   x2   HERNIA REPAIR     KNEE ARTHROSCOPY W/ MENISCAL REPAIR  07/25/2013   right knee Dr. Mardelle Matte   LAPAROSCOPIC GASTRIC SLEEVE RESECTION N/A 06/08/2013   Procedure: LAPAROSCOPIC GASTRIC SLEEVE RESECTION AND EXCISION OF SEBACEUS CYST FROM MID CHEST takedown of incarcerated ventral hernia and primary repair endoscopy;  Surgeon: [name], MD;  Location: [facility] ORS;  Service: General;  Laterality: N/A;   LAPAROSCOPIC NISSEN FUNDOPLICATION N/A 95/02/3266   Procedure: LAPAROSCOPIC REPAIR LARGE SYMPTOMATIC HIATAL HERNIA WITH UPPER ENDOSCOPY;  Surgeon: Johnathan Hausen, MD;  Location: [facility] ORS;  Service: General;  Laterality: N/A;   moles  06/2013   removed 2 moles from under arm and  lowback   NECK SURGERY     occipital nerve damage- injections    OVARIAN CYST [place] EXCISIONAL BREAST BIOPSY Right 06/23/2021   Procedure: RADIOACTIVE SEED GUIDED EXCISIONAL RIGHT BREAST BIOPSY;  Surgeon: Johnathan Hausen, MD;  Location: [place];  Service: General;  Laterality: Right;   RETINAL TEAR REPAIR CRYOTHERAPY Right 01/2016   [place]   SKIN CANCER EXCISION     TONSILLECTOMY     TUBAL LIGATION  Think 1994   UPPER GI ENDOSCOPY  06/08/2013   Procedure: UPPER GI ENDOSCOPY;  Surgeon: [name], MD;  Location: [facility] ORS;  Service: General;;   US ECHOCARDIOGRAPHY  12/2011   WNL - EF 55-60%, mild MR, grade 1 diastolic dysnfiction (mild)   VENTRAL HERNIA REPAIR  2000    Current Medications: Current Meds  Medication Sig   albuterol (VENTOLIN HFA) 108 (90 Base) MCG/ACT inhaler Inhale 2 puffs into the lungs every 6 (six) hours as needed for wheezing or shortness of breath.   budesonide-formoterol (SYMBICORT) 160-4.5 MCG/ACT inhaler Inhale 2 puffs into the lungs 2 (two) times daily.   Calcium Citrate-Vitamin D (CITRACAL + D PO) Take 2 tablets by mouth daily.    Cholecalciferol (VITAMIN D3) 1.25 MG (50000 UT) CAPS Take 1 capsule by mouth once a week.   Coenzyme Q10-Vitamin E (QUNOL ULTRA COQ10) 100-150 MG-UNIT CAPS Take 100 mg by mouth daily.   Cyanocobalamin (VITAMIN B-12) 5000 MCG SUBL Place 5,000 mcg under the tongue daily.   denosumab (PROLIA) 60 MG/ML SOSY injection    diltiazem (CARDIZEM SR) 120 MG 12 hr capsule Take 1 capsule (120 mg total) by mouth 2 (two) times daily.   diltiazem (CARDIZEM) 30 MG tablet Take 1 tablet (30 mg total) by mouth daily as needed (for elevated Heart rate).   ELDERBERRY PO Take 1,000 mg by mouth daily.    Emollient (COLLAGEN PREMIUM SKIN EX) Apply topically.   famotidine (PEPCID) 40 MG tablet Take 40 mg by mouth at bedtime.   fluticasone (FLONASE) 50 MCG/ACT nasal spray SPRAY 2 SPRAYS INTO EACH NOSTRIL EVERY DAY   furosemide (LASIX) 20 MG tablet Take 1 tablet (20 mg total) by mouth daily.    gabapentin (NEURONTIN) 300 MG capsule Take 300 mg by mouth daily.   Lactobacillus Rhamnosus, GG, (CULTURELLE IMMUNITY SUPPORT PO) Take 1 capsule by mouth daily.   Menaquinone-7 (VITAMIN K2 PO) Take 1 tablet by mouth daily.   montelukast (SINGULAIR) 10 MG tablet TAKE 1 TABLET BY MOUTH EVERYDAY AT BEDTIME   Multiple Vitamins-Minerals (CENTRUM SILVER 50+WOMEN PO) Take 1 tablet by mouth daily.   nitroGLYCERIN (NITROSTAT) 0.4 MG SL tablet Place 1 tablet (  0.4 mg total) under the tongue every 5 (five) minutes as needed for chest pain.   Omega-3 Fatty Acids (FISH OIL) 1200 MG CAPS Take 1 capsule by mouth daily.   pantoprazole (PROTONIX) 40 MG tablet TAKE 1 TABLET BY MOUTH EVERY DAY   predniSONE (DELTASONE) 5 MG tablet 10 mg x 2 days, then 5 mg x 2 day then 2.5 mg x 2 day then off.   Riboflavin 100 MG CAPS Take 1 capsule by mouth daily.   rosuvastatin (CRESTOR) 10 MG tablet Take by mouth.   TURMERIC PO Take 1 tablet by mouth daily.     Allergies:   Wasp venom, Chlorhexidine gluconate, Metronidazole, Prednisone, and Sodium hypochlorite   Social History   Socioeconomic History   Marital status: Divorced    Spouse name: Not on file   Number of children: 2   Years of education: 17   Highest education level: Not on file  Occupational History   Occupation: Product manager: [employer]  Tobacco Use   Smoking status: Never   Smokeless tobacco: Never   Tobacco comments:    smoke at age [age]  Vaping Use   Vaping Use: Never used  Substance and Sexual Activity   Alcohol use: Not Currently   Drug use: No   Sexual activity: Not Currently    Partners: Male    Birth control/protection: Abstinence, Pill  Other Topics Concern   Not on file  Social History Narrative   Lives at home alone.   Right-handed.   Occasional caffeine use.   Social Determinants of Health   Financial Resource Strain: Not on file  Food Insecurity: Not on file  Transportation Needs: Not on file   Physical Activity: Not on file  Stress: Not on file  Social Connections: Not on file     Family History: The patient's family history includes Breast cancer in her mother; Diabetes in her father, mother, and another family member; Hemophilia in an other family member; Hypertension in her father, mother, and another family member; Kidney failure in her mother; Stroke in an other family member.  ROS:   Please see the history of present illness.     All other systems reviewed and are negative.  EKGs/Labs/Other Studies Reviewed:    The following studies were reviewed today:   EKG:  EKG is  ordered today.  The ekg ordered today demonstrates normal sinus rhythm, normal ECG. Recent Labs: 08/28/2021: TSH 0.85 03/24/2022: BUN 24; Creatinine, Ser 0.70; Hemoglobin 13.2; Platelets 297; Potassium 3.8; Sodium 139  Recent Lipid Panel    Component Value Date/Time   CHOL 168 07/31/2021 0905   TRIG 88 07/31/2021 0905   HDL 67 07/31/2021 0905   CHOLHDL 2.5 07/31/2021 0905   CHOLHDL 2.9 02/24/2021 0719   VLDL 13 02/24/2021 0719   LDLCALC 85 07/31/2021 0905    Physical Exam:    VS:  BP 132/82 (BP Location: Left Arm, Patient Position: Sitting, Cuff Size: Large)   Pulse 74   Ht '5\' 6"'$  (1.676 m)   Wt 279 lb 3.2 oz (126.6 kg)   SpO2 98%   BMI 45.06 kg/m     Wt Readings from Last 3 Encounters:  03/30/22 279 lb 3.2 oz (126.6 kg)  03/24/22 285 lb 0.9 oz (129.3 kg)  03/21/22 285 lb 0.9 oz (129.3 kg)     GEN:  Well nourished, well developed in no acute distress, obese HEENT: Normal NECK: No JVD; No carotid bruits CARDIAC: RRR, no  murmurs, rubs, gallops RESPIRATORY:  Clear to auscultation without rales, wheezing or rhonchi  ABDOMEN: Soft, non-tender, non-distended MUSCULOSKELETAL: Lower extremity edema and lymphedema noted SKIN: Warm and dry NEUROLOGIC:  Alert and oriented x 3 PSYCHIATRIC:  Normal affect   ASSESSMENT:    1. Paroxysmal atrial fibrillation (HCC)   2. Primary  hypertension   3. Paroxysmal SVT (supraventricular tachycardia)   4. Pure hypercholesterolemia   5. Bilateral leg edema    PLAN:    In order of problems listed above:   Paroxysmal atrial fibrillation in the context of steroid administration, recent temporal artery biopsy, with spontaneous conversion a few minutes later.  Unsure if this is an incident of lone A-fib.  Currently undergoing prednisone taper.  Place cardiac monitor.  Recommend against anticoagulation due to recent temporal artery biopsy.  If another incident of A-fib is noted while patient is off prednisone, will recommend anticoagulation. hypertension.  BP controlled. Continue Cardizem SR 120 mg twice daily. paroxysmal SVT, symptoms controlled, continue Cardizem SR 120 mg twice daily. Hyperlipidemia, continue 10 mg daily, obtain fasting lipid profile. Bilateral leg edema in the context of steroid use.  Agree with Lasix 20 mg daily.  Follow-up in 6 to 8 weeks after cardiac monitor.  Total encounter time more than 40 minutes  Greater than 50% was spent in counseling and coordination of care with the patient   This note was generated in part or whole with voice recognition software. Voice recognition is usually quite accurate but there are transcription errors that can and very often do occur. I apologize for any typographical errors that were not detected and corrected.  Medication Adjustments/Labs and Tests Ordered: Current medicines are reviewed at length with the patient today.  Concerns regarding medicines are outlined above.  Orders Placed This Encounter  Procedures   LONG TERM MONITOR (3-14 DAYS)   EKG 12-Lead    Meds ordered this encounter  Medications   furosemide (LASIX) 20 MG tablet    Sig: Take 1 tablet (20 mg total) by mouth daily.    Dispense:  90 tablet    Refill:  3     Patient Instructions  Medication Instructions:     START taking your Furosemide (Lasix) 20 MG once a day.  *If you need a  refill on your cardiac medications before your next appointment, please call your pharmacy*    Testing/Procedures:  Your physician has recommended that you wear a Zio XT monitor for 2 weeks. This will be mailed to your home address in 4-5 business days.   Your clinician has requested a Zio heart rhythm monitor by [employer] to be mailed to your home for you to wear for 14 days. You should expect a small box to arrive via USPS (or FedEx in some cases) within this next week. If you do not receive it please call [employer] at [phone].  Closely watching your heart at this time will help your care team understand more and provide information needed to develop your plan of care.  Please apply your Zio patch monitor the day you receive it. Keep this packaging, you will use this to return your Zio monitor.  You will easily be able to apply the monitor with the instructions provided in the Patient Guide.  If you need assistance, [employer] representatives are available 24/7 at [phone].  You can also download the [facility] app on your phone to view detailed application instructions and log symptoms.  After you wear your monitor for 14 days, place it  back in the blue box or envelope, along with your Symptom Log.  To send your monitor back: Simply use the pre-addressed and pre-paid box/envelope.  Send it back through [employer] the same day you remove it via your local post office or by placing it in your mailbox.  As soon as we receive the results, they will be reviewed and your clinician will contact you.  For the first 24 hours- it is essential to not shower or exercise, to allow the patch to adhere to your skin. Avoid excessive sweating to help maximize wear time. Do not submerge the device, no hot tubs, and no swimming pools. Keep any lotions or oils away from the patch. After 24 hours you may shower with the patch on. Take brief showers with your back facing the shower head.  Do not remove  patch once it has been placed because that will interrupt data and decrease adhesive wear time. Push the button when you have any symptoms and write down what you were feeling. Once you have completed wearing your monitor, remove and place into box which has postage paid and place in your outgoing mailbox.  If for some reason you have misplaced your box then call our office and we can provide another box and/or mail it off for you.    Follow-Up: At [facility], you and your health needs are our priority.  As part of our continuing mission to provide you with exceptional heart care, we have created designated Provider Care Teams.  These Care Teams include your primary Cardiologist (physician) and Advanced Practice Providers (APPs -  Physician Assistants and Nurse Practitioners) who all work together to provide you with the care you need, when you need it.  We recommend signing up for the patient portal called "MyChart".  Sign up information is provided on this After Visit Summary.  MyChart is used to connect with patients for Virtual Visits (Telemedicine).  Patients are able to view lab/test results, encounter notes, upcoming appointments, etc.  Non-urgent messages can be sent to your provider as well.   To learn more about what you can do with MyChart, go to [email or web address].    Your next appointment:   6 week(s)  The format for your next appointment:   In Person  Provider:    ONLY WITH [name], MD    Other Instructions   Important Information About Sugar         Signed, [name], MD  03/30/2022 10:53 AM    [place]

## 2022-04-02 ENCOUNTER — Encounter: Payer: Self-pay | Admitting: Family Medicine

## 2022-04-02 ENCOUNTER — Telehealth: Payer: Self-pay | Admitting: Pulmonary Disease

## 2022-04-02 ENCOUNTER — Ambulatory Visit: Payer: Medicare Other | Admitting: Cardiology

## 2022-04-02 DIAGNOSIS — R519 Headache, unspecified: Secondary | ICD-10-CM | POA: Diagnosis not present

## 2022-04-02 NOTE — Telephone Encounter (Signed)
The x-ray did not show anything acute.

## 2022-04-02 NOTE — Telephone Encounter (Signed)
Called and spoke to patient.  She stated that she had a recent CXR. She would like Dr. Patsey Berthold to review CXR and determine if she needs to be seen?  Dr. Patsey Berthold, please advise. Thanks

## 2022-04-02 NOTE — Telephone Encounter (Signed)
Patient is aware of below message and voiced her understanding.  Nothing further needed.   

## 2022-04-03 ENCOUNTER — Other Ambulatory Visit: Payer: Self-pay | Admitting: Family Medicine

## 2022-04-03 DIAGNOSIS — R519 Headache, unspecified: Secondary | ICD-10-CM

## 2022-04-03 DIAGNOSIS — I480 Paroxysmal atrial fibrillation: Secondary | ICD-10-CM

## 2022-04-04 NOTE — Progress Notes (Signed)
MRN : [id number]  Kimberly Patton is a 66 y.o. (1956/05/25) female who presents with chief complaint of legs hurt and swell.  History of Present Illness:   The patient returns to the office for followup evaluation regarding leg swelling associated with a large painful varicose vein in the medial left thigh.  The the varicosity and has been increasing in size and the pain associated with vein continues. There have not been any interval development of a ulcerations or wounds.  Since the previous visit the patient has been wearing graduated compression stockings and has noted little if any improvement in the lymphedema. The patient has been using compression routinely morning until night.  The patient also states elevation during the day and exercise is being done too.  She is also concerned regarding the temporal artery biopsy site as it is still tender.  No outpatient medications have been marked as taking for the 04/09/22 encounter (Appointment) with Delana Meyer, Dolores Lory, MD.    Past Medical History:  Diagnosis Date   ALLERGIC RHINITIS 04/03/2007   Allergy    Not sure   Anemia    ((Pt Qnr Sub: Denies at visit from 08/28/2021 with endocrinology))    Anginal pain ([place])    Anxiety    Arthritis Not sure   Asthma    hx of years ago - no longer a problem    ASTHMA, PERSISTENT, MODERATE 04/03/2007   Cancer ([place])    skin    Dyspnea on exertion    a. 11/2007 Echo: EF 60%.   Dysrhythmia    hx of heart arrhythmia 10-15 years ago - followed by DR Lovena Le - not seen in years    GERD (gastroesophageal reflux disease)    Hemophilia carrier    Hypertension    Joint pain    HIPS / LEGS   Knee injury    RT   Meningioma ([place])    Morbid obesity ([place])    Neuromuscular disorder ([place]) Not sure, since my fall   Paroxysmal SVT (supraventricular tachycardia)    a. 11/2011 48h Holter: RSR, rare PVC's, occas PAC's   Pre-diabetes    Retinal tear of right eye 01/2016   Ventral hernia      Past Surgical History:  Procedure Laterality Date   [place]   ARTERY BIOPSY Left 03/21/2022   Procedure: BIOPSY TEMPORAL ARTERY;  Surgeon: Katha Cabal, MD;  Location: [facility] ORS;  Service: Vascular;  Laterality: Left;   BIOPSY THYROID     08/2021   BREAST CYST EXCISION Right 1994   Benign   CESAREAN SECTION     x2   CHOLECYSTECTOMY  1995   COLONOSCOPY WITH PROPOFOL N/A 05/09/2021   Procedure: COLONOSCOPY WITH PROPOFOL;  Surgeon: [name], MD;  Location: [facility] ENDOSCOPY;  Service: Gastroenterology;  Laterality: N/A;   EYE SURGERY  01/2016   Repair retinal tear   FOOT SURGERY  1995 / 1996   x2   HERNIA REPAIR     KNEE ARTHROSCOPY W/ MENISCAL REPAIR  07/25/2013   right knee Dr. Mardelle Matte   LAPAROSCOPIC GASTRIC SLEEVE RESECTION N/A 06/08/2013   Procedure: LAPAROSCOPIC GASTRIC SLEEVE RESECTION AND EXCISION OF SEBACEUS CYST FROM MID CHEST takedown of incarcerated ventral hernia and primary repair endoscopy;  Surgeon: [name], MD;  Location: [facility];  Service: General;  Laterality: N/A;   LAPAROSCOPIC NISSEN FUNDOPLICATION N/A 92/04/9416  Procedure: LAPAROSCOPIC REPAIR LARGE SYMPTOMATIC HIATAL HERNIA WITH UPPER ENDOSCOPY;  Surgeon: Johnathan Hausen, MD;  Location: [facility];  Service: General;  Laterality: N/A;   moles  06/2013   removed 2 moles from under arm and  lowback   NECK SURGERY     occipital nerve damage- injections    OVARIAN CYST [place] EXCISIONAL BREAST BIOPSY Right 06/23/2021   Procedure: RADIOACTIVE SEED GUIDED EXCISIONAL RIGHT BREAST BIOPSY;  Surgeon: Johnathan Hausen, MD;  Location: [facility];  Service: General;  Laterality: Right;   RETINAL TEAR REPAIR CRYOTHERAPY Right 01/2016   [place]   SKIN CANCER EXCISION     TONSILLECTOMY     TUBAL LIGATION  Think 1994   UPPER GI ENDOSCOPY  06/08/2013   Procedure: UPPER GI ENDOSCOPY;   Surgeon: [name], MD;  Location: [facility];  Service: General;;   US ECHOCARDIOGRAPHY  12/2011   WNL - EF 55-60%, mild MR, grade 1 diastolic dysnfiction (mild)   VENTRAL HERNIA REPAIR  2000    Social History Social History   Tobacco Use   Smoking status: Never   Smokeless tobacco: Never   Tobacco comments:    smoke at age [age]  Vaping Use   Vaping Use: Never used  Substance Use Topics   Alcohol use: Not Currently   Drug use: No    Family History Family History  Problem Relation Age of Onset   Breast cancer Mother    Diabetes Mother    Hypertension Mother    Kidney failure Mother    Diabetes Father    Hypertension Father    Diabetes Other    Hypertension Other    Stroke Other    Hemophilia Other     Allergies  Allergen Reactions   Wasp Venom Anaphylaxis   Chlorhexidine Gluconate     Itching under breasts   Metronidazole Other (See Comments)    Chest tightness, neck tightness   Prednisone Other (See Comments)    Severe migraine / with taper dose   Sodium Hypochlorite Rash    Liquid clorox bleach     REVIEW OF SYSTEMS (Negative unless checked)  Constitutional: _0 Weight loss  _1 Fever  _2 Chills Cardiac: _3 Chest pain   _4 Chest pressure   _5 Palpitations   _6 Shortness of breath when laying flat   _7 Shortness of breath with exertion. Vascular:  _8 Pain in legs with walking   _9 Pain in legs at rest  _10 History of DVT   _11 Phlebitis   _12 Swelling in legs   _13 Varicose veins   _14 Non-healing ulcers Pulmonary:   _15 Uses home oxygen   _16 Productive cough   _17 Hemoptysis   _18 Wheeze  _19 COPD   _20 Asthma Neurologic:  _21 Dizziness   _22 Seizures   _23 History of stroke   _24 History of TIA  _25 Aphasia   _26 Vissual changes   _27 Weakness or numbness in arm   _28 Weakness or numbness in leg Musculoskeletal:   _29 Joint swelling   _30 Joint pain   _31 Low back pain Hematologic:  _32 Easy bruising  _33 Easy bleeding   _34 Hypercoagulable state   _35 Anemic Gastrointestinal:  _36 Diarrhea   _37 Vomiting   _38 Gastroesophageal reflux/heartburn   _39 Difficulty swallowing. Genitourinary:  _40 Chronic kidney disease   _41 Difficult urination  _42 Frequent urination   _43 Blood in urine Skin:  _44 Rashes   _45 Ulcers  Psychological:  _46 History of anxiety   _47  History of major depression.  Physical Examination  There were no vitals filed for this visit. There is no height or  weight on file to calculate BMI. Gen: WD/WN, NAD Head: [place]/AT, No temporalis wasting.  Ear/Nose/Throat: Hearing grossly intact, nares w/o erythema or drainage, pinna without lesions Eyes: PER, EOMI, sclera nonicteric.  Neck: Supple, no gross masses.  No JVD.  Pulmonary:  Good air movement, no audible wheezing, no use of accessory muscles.  Cardiac: RRR, precordium not hyperdynamic. Vascular: The left medial thigh has a very large greater than 10 mm prominent varicosity present.  Moderate venous stasis changes to the legs bilaterally.  2+ soft pitting edema.  The left temporal artery biopsy site is clean dry and intact and healing nicely Vessel Right Left  Radial Palpable Palpable  Gastrointestinal: soft, non-distended. No guarding/no peritoneal signs.  Musculoskeletal: M/S 5/5 throughout.  No deformity.  Neurologic: CN 2-12 intact. Pain and light touch intact in extremities.  Symmetrical.  Speech is fluent. Motor exam as listed above. Psychiatric: Judgment intact, Mood & affect appropriate for pt's clinical situation. Dermatologic: Venous rashes no ulcers noted.  No changes consistent with cellulitis. Lymph : No lichenification or skin changes of chronic lymphedema.  CBC Lab Results  Component Value Date   WBC 14.0 (H) 03/24/2022   HGB 13.2 03/24/2022   HCT 42.7 03/24/2022   MCV 84.1 03/24/2022   PLT 297 03/24/2022    BMET    Component Value Date/Time   NA 139 03/24/2022 1040   NA 139 01/21/2020 1137   K 3.8 03/24/2022 1040   CL 104 03/24/2022 1040   CO2 26 03/24/2022 1040   GLUCOSE 126 (H) 03/24/2022 1040   BUN 24 (H)  03/24/2022 1040   BUN 16 01/21/2020 1137   CREATININE 0.70 03/24/2022 1040   CREATININE 0.57 01/01/2020 1623   CALCIUM 8.9 03/24/2022 1040   GFRNONAA >60 03/24/2022 1040   GFRAA >60 02/02/2020 1417   Estimated Creatinine Clearance: 94.1 mL/min (by C-G formula based on SCr of 0.7 mg/dL).  COAG Lab Results  Component Value Date   INR 1.0 12/03/2007   INR 1.0 11/25/2007    Radiology DG Chest 2 View  Result Date: 03/24/2022 CLINICAL DATA:  Tachycardia and hypertension. EXAM: CHEST - 2 VIEW COMPARISON:  09/21/2021 FINDINGS: Low volume film. The cardio pericardial silhouette is enlarged. The lungs are clear without focal pneumonia, edema, pneumothorax or pleural effusion. There is pulmonary vascular congestion without overt pulmonary edema. The visualized bony structures of the thorax are unremarkable. IMPRESSION: No active cardiopulmonary disease. Electronically Signed   By: Misty Stanley M.D.   On: 03/24/2022 11:00   MR Brain W Wo Contrast  Result Date: 03/09/2022 CLINICAL DATA:  Headache, new or worsening (Age >= 50y) Brain/CNS neoplasm, monitor EXAM: MRI HEAD WITHOUT AND WITH CONTRAST TECHNIQUE: Multiplanar, multiecho pulse sequences of the brain and surrounding structures were obtained without and with intravenous contrast. CONTRAST:  10 mL VueWay IV COMPARISON:  MRI 06/02/2020. FINDINGS: Brain: Unchanged size/appearance of a 2.0 x 2.3 cm extra-axial dural-based enhancing mass along the high left frontal convexity at the vertex (series 18, image 137). Additional 5 mm focus of dural-based enhancement along the right frontal cortex (series 18, image 122) is also unchanged. No significant mass effect or brain edema. No evidence of acute infarct, acute hemorrhage, midline shift, or hydrocephalus. Partially empty sella. Vascular: Major arterial flow voids are maintained skull base. Skull and upper cervical spine: Normal marrow signal. Sinuses/Orbits: Clear sinuses.  No acute orbital findings.  Other: No mastoid effusions. IMPRESSION: 1. No evidence of acute intracranial abnormality. 2. Unchanged size/appearance of a 2.3 cm  putative meningioma along the high left frontal vertex. 3. Possible additional 6 mm meningioma along the right frontal convexity, also unchanged. 4. Partially empty sella, which is often a normal anatomic variant but can be associated with idiopathic intracranial hypertension. Electronically Signed   By: Margaretha Sheffield M.D.   On: 03/09/2022 17:07     Assessment/Plan 1. Varicose veins with inflammation Recommend:  The patient has had successful ablation of the previously incompetent saphenous venous system but still has persistent symptoms of pain and swelling that are having a negative impact on daily life and daily activities.  There is 1 very large very prominent symptomatic varicosity in particular in the left medial thigh  Patient should undergo injection sclerotherapy to treat this residual varicosities.  The risks, benefits and alternative therapies were reviewed in detail with the patient.  All questions were answered.  The patient agrees to proceed with sclerotherapy at their convenience.  The patient will continue wearing the graduated compression stockings and using the over-the-counter pain medications to treat her symptoms.    2. Chronic venous insufficiency Recommend:  No surgery or intervention at this point in time.    I have reviewed my discussion with the patient regarding lymphedema and why it  causes symptoms.  Patient will continue wearing graduated compression on a daily basis. The patient should put the compression on first thing in the morning and removing them in the evening. The patient should not sleep in the compression.   In addition, behavioral modification throughout the day will be continued.  This will include frequent elevation (such as in a recliner), use of over the counter pain medications as needed and exercise such as  walking.  The systemic causes for chronic edema such as liver, kidney and cardiac etiologies does not appear to have significant changed over the past year.    The patient will continue aggressive use of the  lymph pump.  This will continue to improve the edema control and prevent sequela such as ulcers and infections.   The patient will follow-up with me on an annual basis.    3. Lymphedema Recommend:  No surgery or intervention at this point in time.    I have reviewed my discussion with the patient regarding lymphedema and why it  causes symptoms.  Patient will continue wearing graduated compression on a daily basis. The patient should put the compression on first thing in the morning and removing them in the evening. The patient should not sleep in the compression.   In addition, behavioral modification throughout the day will be continued.  This will include frequent elevation (such as in a recliner), use of over the counter pain medications as needed and exercise such as walking.  The systemic causes for chronic edema such as liver, kidney and cardiac etiologies does not appear to have significant changed over the past year.    The patient will continue aggressive use of the  lymph pump.  This will continue to improve the edema control and prevent sequela such as ulcers and infections.   The patient will follow-up with me on an annual basis.    4. Cervicogenic headache We discussed the possibility of a contralateral biopsy.  We are unable to arrange an ultrasound.  The patient has met with her rheumatologist and reviewed the associated signs and indications.  At this point her headache is somewhat better the indications for temporal arteritis are not particularly strong and rheumatology does not wish to pursue a contralateral biopsy.  5. Coronary artery disease of native artery of native heart with stable angina pectoris ([place]) Continue cardiac and antihypertensive medications as  already ordered and reviewed, no changes at this time.  Continue statin as ordered and reviewed, no changes at this time  Nitrates PRN for chest pain   6. Primary hypertension Continue antihypertensive medications as already ordered, these medications have been reviewed and there are no changes at this time.     Hortencia Pilar, MD  04/04/2022 2:52 PM

## 2022-04-05 ENCOUNTER — Other Ambulatory Visit: Payer: Self-pay | Admitting: *Deleted

## 2022-04-05 ENCOUNTER — Encounter: Payer: Self-pay | Admitting: Cardiology

## 2022-04-05 MED ORDER — FUROSEMIDE 20 MG PO TABS: 20.0000 mg | ORAL_TABLET | Freq: Every day | ORAL | 0 refills | Status: DC

## 2022-04-09 ENCOUNTER — Encounter (INDEPENDENT_AMBULATORY_CARE_PROVIDER_SITE_OTHER): Payer: Self-pay

## 2022-04-09 ENCOUNTER — Encounter (INDEPENDENT_AMBULATORY_CARE_PROVIDER_SITE_OTHER): Payer: Self-pay | Admitting: Vascular Surgery

## 2022-04-09 ENCOUNTER — Encounter: Payer: Self-pay | Admitting: Family Medicine

## 2022-04-09 ENCOUNTER — Ambulatory Visit (INDEPENDENT_AMBULATORY_CARE_PROVIDER_SITE_OTHER): Payer: Medicare Other | Admitting: Vascular Surgery

## 2022-04-09 VITALS — BP 150/79 | HR 75 | Resp 16 | Wt 282.0 lb

## 2022-04-09 DIAGNOSIS — I890 Lymphedema, not elsewhere classified: Secondary | ICD-10-CM | POA: Diagnosis not present

## 2022-04-09 DIAGNOSIS — I872 Venous insufficiency (chronic) (peripheral): Secondary | ICD-10-CM

## 2022-04-09 DIAGNOSIS — I8310 Varicose veins of unspecified lower extremity with inflammation: Secondary | ICD-10-CM

## 2022-04-09 DIAGNOSIS — I25118 Atherosclerotic heart disease of native coronary artery with other forms of angina pectoris: Secondary | ICD-10-CM

## 2022-04-09 DIAGNOSIS — I8312 Varicose veins of left lower extremity with inflammation: Secondary | ICD-10-CM | POA: Diagnosis not present

## 2022-04-09 DIAGNOSIS — I10 Essential (primary) hypertension: Secondary | ICD-10-CM

## 2022-04-09 DIAGNOSIS — G4486 Cervicogenic headache: Secondary | ICD-10-CM

## 2022-04-10 ENCOUNTER — Encounter (INDEPENDENT_AMBULATORY_CARE_PROVIDER_SITE_OTHER): Payer: Self-pay | Admitting: Vascular Surgery

## 2022-04-16 ENCOUNTER — Encounter: Payer: Self-pay | Admitting: Cardiology

## 2022-04-18 DIAGNOSIS — I480 Paroxysmal atrial fibrillation: Secondary | ICD-10-CM | POA: Diagnosis not present

## 2022-04-19 ENCOUNTER — Other Ambulatory Visit: Payer: Self-pay | Admitting: Family Medicine

## 2022-04-19 ENCOUNTER — Ambulatory Visit: Payer: Medicare Other | Admitting: Cardiology

## 2022-04-19 MED ORDER — RISEDRONATE SODIUM 35 MG PO TABS: 35.0000 mg | ORAL_TABLET | ORAL | 11 refills | Status: DC

## 2022-04-20 NOTE — Telephone Encounter (Signed)
Please schedule Medicare Wellness with nurse and CPE with fasting labs prior with Dr. Diona Browner.

## 2022-04-23 ENCOUNTER — Encounter: Payer: Self-pay | Admitting: Family Medicine

## 2022-04-23 DIAGNOSIS — I890 Lymphedema, not elsewhere classified: Secondary | ICD-10-CM

## 2022-04-24 MED ORDER — ALBUTEROL SULFATE (2.5 MG/3ML) 0.083% IN NEBU: 2.5000 mg | INHALATION_SOLUTION | Freq: Once | RESPIRATORY_TRACT | Status: AC

## 2022-04-25 DIAGNOSIS — I890 Lymphedema, not elsewhere classified: Secondary | ICD-10-CM | POA: Diagnosis not present

## 2022-04-26 ENCOUNTER — Ambulatory Visit (INDEPENDENT_AMBULATORY_CARE_PROVIDER_SITE_OTHER): Payer: Medicare Other | Admitting: Nurse Practitioner

## 2022-04-26 ENCOUNTER — Encounter (INDEPENDENT_AMBULATORY_CARE_PROVIDER_SITE_OTHER): Payer: Self-pay | Admitting: Nurse Practitioner

## 2022-04-26 VITALS — BP 162/87 | HR 79 | Resp 18 | Ht 66.0 in | Wt 274.0 lb

## 2022-04-26 DIAGNOSIS — I83893 Varicose veins of bilateral lower extremities with other complications: Secondary | ICD-10-CM | POA: Diagnosis not present

## 2022-04-28 ENCOUNTER — Telehealth: Payer: Self-pay | Admitting: Family Medicine

## 2022-04-28 DIAGNOSIS — E7800 Pure hypercholesterolemia, unspecified: Secondary | ICD-10-CM

## 2022-04-28 DIAGNOSIS — E236 Other disorders of pituitary gland: Secondary | ICD-10-CM

## 2022-04-28 NOTE — Telephone Encounter (Signed)
-----   Message from Velna Hatchet, RT sent at 04/25/2022 11:24 AM EST ----- Regarding: Fri 12/1 lab Patient is scheduled for cpx, please order future labs.  Thanks, Anda Kraft

## 2022-04-30 ENCOUNTER — Encounter: Payer: Self-pay | Admitting: Family Medicine

## 2022-04-30 ENCOUNTER — Encounter: Payer: Self-pay | Admitting: Internal Medicine

## 2022-04-30 ENCOUNTER — Telehealth: Payer: Self-pay | Admitting: Family Medicine

## 2022-04-30 ENCOUNTER — Other Ambulatory Visit (INDEPENDENT_AMBULATORY_CARE_PROVIDER_SITE_OTHER): Payer: Self-pay | Admitting: Vascular Surgery

## 2022-04-30 ENCOUNTER — Telehealth (INDEPENDENT_AMBULATORY_CARE_PROVIDER_SITE_OTHER): Payer: Medicare Other | Admitting: Internal Medicine

## 2022-04-30 VITALS — BP 123/102 | HR 79

## 2022-04-30 DIAGNOSIS — I8310 Varicose veins of unspecified lower extremity with inflammation: Secondary | ICD-10-CM

## 2022-04-30 DIAGNOSIS — U071 COVID-19: Secondary | ICD-10-CM | POA: Diagnosis not present

## 2022-04-30 MED ORDER — NIRMATRELVIR/RITONAVIR (PAXLOVID)TABLET: 3.0000 | ORAL_TABLET | Freq: Two times a day (BID) | ORAL | 0 refills | Status: AC

## 2022-04-30 MED ORDER — NIRMATRELVIR/RITONAVIR (PAXLOVID)TABLET: 3.0000 | ORAL_TABLET | Freq: Two times a day (BID) | ORAL | 0 refills | Status: DC

## 2022-04-30 NOTE — Telephone Encounter (Signed)
Pt called back. The 1st 2 rxs failed to send. I sent it again and it looks like it went through this time.

## 2022-04-30 NOTE — Assessment & Plan Note (Addendum)
Mild symptoms Higher risk due to asthma and age Discussed alternatives---will go ahead with antivirals Will give paxlovid--normal GFR Hold crestor while on, diltiazem every other day Tylenol as needed Isolate for this week----mask after first going out

## 2022-04-30 NOTE — Telephone Encounter (Signed)
Pt called stating she was told by [facility] in [place] that the prescription for nirmatrelvir/ritonavir EUA (PAXLOVID) 20 x 150 MG & 10 x '100MG'$  TABS wasn't received. I told pt the prescription was sent in today. Pt is asking can it be resent? Call back # [phone]

## 2022-04-30 NOTE — Progress Notes (Signed)
Subjective:    Patient ID: Kimberly Patton, female    DOB: 1956-05-16, 66 y.o.   MRN: [id number]  HPI Video virtual visit due to COVID infection Identification done Reviewed limitations and billing and she gave consent Participants---patient in her home and I am in my office  Son came home from [place] a week ago Started with cough--and he worsened COVID test turned positive  She tested 2 days ago---negative That night, stomach queasy, throat sore and headache Tested again and it was positive  No fever Feels cold---nothing new BP up some Some night cough--when lying down Just stable SOB from asthma----does use the symbicort daily but hasn't needed the albuterol  Tried tylenol sinus--for the headaches Coricidin --not clear if any help  Current Outpatient Medications on File Prior to Visit  Medication Sig Dispense Refill   albuterol (VENTOLIN HFA) 108 (90 Base) MCG/ACT inhaler Inhale 2 puffs into the lungs every 6 (six) hours as needed for wheezing or shortness of breath. 8 g 2   budesonide-formoterol (SYMBICORT) 160-4.5 MCG/ACT inhaler Inhale 2 puffs into the lungs 2 (two) times daily. 1 each 5   Calcium Citrate-Vitamin D (CITRACAL + D PO) Take 2 tablets by mouth daily.      Cholecalciferol (VITAMIN D3) 1.25 MG (50000 UT) CAPS Take 1 capsule by mouth once a week. 12 capsule 3   Coenzyme Q10-Vitamin E (QUNOL ULTRA COQ10) 100-150 MG-UNIT CAPS Take 100 mg by mouth daily.     Cyanocobalamin (VITAMIN B-12) 5000 MCG SUBL Place 5,000 mcg under the tongue daily.     diltiazem (CARDIZEM SR) 120 MG 12 hr capsule Take 1 capsule (120 mg total) by mouth 2 (two) times daily. 60 capsule 9   ELDERBERRY PO Take 1,000 mg by mouth daily.      famotidine (PEPCID) 40 MG tablet Take 40 mg by mouth at bedtime.     fluticasone (FLONASE) 50 MCG/ACT nasal spray SPRAY 2 SPRAYS INTO EACH NOSTRIL EVERY DAY 48 mL 1   furosemide (LASIX) 20 MG tablet Take 1 tablet (20 mg total) by mouth daily. 90 tablet 0    gabapentin (NEURONTIN) 300 MG capsule Take 300 mg by mouth daily.     Lactobacillus Rhamnosus, GG, (CULTURELLE IMMUNITY SUPPORT PO) Take 1 capsule by mouth daily.     Menaquinone-7 (VITAMIN K2 PO) Take 1 tablet by mouth daily.     montelukast (SINGULAIR) 10 MG tablet TAKE 1 TABLET BY MOUTH EVERYDAY AT BEDTIME 90 tablet 1   Multiple Vitamins-Minerals (CENTRUM SILVER 50+WOMEN PO) Take 1 tablet by mouth daily.     nitroGLYCERIN (NITROSTAT) 0.4 MG SL tablet Place 1 tablet (0.4 mg total) under the tongue every 5 (five) minutes as needed for chest pain. 25 tablet 0   Omega-3 Fatty Acids (FISH OIL) 1200 MG CAPS Take 1 capsule by mouth daily.     pantoprazole (PROTONIX) 40 MG tablet TAKE 1 TABLET BY MOUTH EVERY DAY 90 tablet 1   Riboflavin 100 MG CAPS Take 1 capsule by mouth daily.     rosuvastatin (CRESTOR) 10 MG tablet Take by mouth.     TURMERIC PO Take 1 tablet by mouth daily.     denosumab (PROLIA) 60 MG/ML SOSY injection  (Patient not taking: Reported on 04/30/2022)     diltiazem (CARDIZEM) 30 MG tablet Take 1 tablet (30 mg total) by mouth daily as needed (for elevated Heart rate). 30 tablet 0   risedronate (ACTONEL) 35 MG tablet Take 1 tablet (35 mg total) by  mouth every 7 (seven) days. with water on empty stomach, nothing by mouth or lie down for next 30 minutes.Make sure this is actonel EC/coated (Patient not taking: Reported on 04/30/2022) 4 tablet 11   Current Facility-Administered Medications on File Prior to Visit  Medication Dose Route Frequency Provider Last Rate Last Admin   albuterol (PROVENTIL) (2.5 MG/3ML) 0.083% nebulizer solution 2.5 mg  2.5 mg Nebulization Once Tyler Pita, MD        Allergies  Allergen Reactions   Wasp Venom Anaphylaxis   Chlorhexidine Gluconate     Itching under breasts   Metronidazole Other (See Comments)    Chest tightness, neck tightness   Prednisone Other (See Comments)    Severe migraine / with taper dose   Sodium Hypochlorite Rash    Liquid  clorox bleach    Past Medical History:  Diagnosis Date   ALLERGIC RHINITIS 04/03/2007   Allergy    Not sure   Anemia    ((Pt Qnr Sub: Denies at visit from 08/28/2021 with endocrinology))    Anginal pain ([place])    Anxiety    Arthritis Not sure   Asthma    hx of years ago - no longer a problem    ASTHMA, PERSISTENT, MODERATE 04/03/2007   Cancer ([place])    skin    Dyspnea on exertion    a. 11/2007 Echo: EF 60%.   Dysrhythmia    hx of heart arrhythmia 10-15 years ago - followed by DR Lovena Le - not seen in years    GERD (gastroesophageal reflux disease)    Hemophilia carrier    Hypertension    Joint pain    HIPS / LEGS   Knee injury    RT   Meningioma ([place])    Morbid obesity ([place])    Neuromuscular disorder ([place]) Not sure, since my fall   Paroxysmal SVT (supraventricular tachycardia)    a. 11/2011 48h Holter: RSR, rare PVC's, occas PAC's   Pre-diabetes    Retinal tear of right eye 01/2016   Ventral hernia     Past Surgical History:  Procedure Laterality Date   [place]   ARTERY BIOPSY Left 03/21/2022   Procedure: BIOPSY TEMPORAL ARTERY;  Surgeon: Katha Cabal, MD;  Location: [facility] ORS;  Service: Vascular;  Laterality: Left;   BIOPSY THYROID     08/2021   BREAST CYST EXCISION Right 1994   Benign   CESAREAN SECTION     x2   CHOLECYSTECTOMY  1995   COLONOSCOPY WITH PROPOFOL N/A 05/09/2021   Procedure: COLONOSCOPY WITH PROPOFOL;  Surgeon: [name], MD;  Location: [facility] ENDOSCOPY;  Service: Gastroenterology;  Laterality: N/A;   EYE SURGERY  01/2016   Repair retinal tear   FOOT SURGERY  1995 / 1996   x2   HERNIA REPAIR     KNEE ARTHROSCOPY W/ MENISCAL REPAIR  07/25/2013   right knee Dr. Mardelle Matte   LAPAROSCOPIC GASTRIC SLEEVE RESECTION N/A 06/08/2013   Procedure: LAPAROSCOPIC GASTRIC SLEEVE RESECTION AND EXCISION OF SEBACEUS CYST FROM MID CHEST takedown of incarcerated ventral hernia and primary repair endoscopy;  Surgeon:  [name], MD;  [facility];  Service: General;  Laterality: N/A;   LAPAROSCOPIC NISSEN FUNDOPLICATION N/A 64/33/2951   Procedure: LAPAROSCOPIC REPAIR LARGE SYMPTOMATIC HIATAL HERNIA WITH UPPER ENDOSCOPY;  Surgeon: Johnathan Hausen, MD;  Location: [facility];  Service: General;  Laterality: N/A;   moles  06/2013   removed 2 moles  from under arm and  lowback   NECK SURGERY     occipital nerve damage- injections    OVARIAN CYST REMOVAL  1970   PILONIDAL CYST EXCISION  1975   RADIOACTIVE SEED GUIDED EXCISIONAL BREAST BIOPSY Right 06/23/2021   Procedure: RADIOACTIVE SEED GUIDED EXCISIONAL RIGHT BREAST BIOPSY;  Surgeon: Johnathan Hausen, MD;  Location: [facility];  Service: General;  Laterality: Right;   RETINAL TEAR REPAIR CRYOTHERAPY Right 01/2016   [place]   SKIN CANCER EXCISION     TONSILLECTOMY     TUBAL LIGATION  Think 1994   UPPER GI ENDOSCOPY  06/08/2013   Procedure: UPPER GI ENDOSCOPY;  Surgeon: [name], MD;  [facility];  Service: General;;   US ECHOCARDIOGRAPHY  12/2011   WNL - EF 55-60%, mild MR, grade 1 diastolic dysnfiction (mild)   VENTRAL HERNIA REPAIR  2000    Family History  Problem Relation Age of Onset   Breast cancer Mother    Diabetes Mother    Hypertension Mother    Kidney failure Mother    Diabetes Father    Hypertension Father    Diabetes Other    Hypertension Other    Stroke Other    Hemophilia Other     Social History   Socioeconomic History   Marital status: Divorced    Spouse name: Not on file   Number of children: 2   Years of education: 69   Highest education level: Not on file  Occupational History   Occupation: Product manager: [employer]  Tobacco Use   Smoking status: Never   Smokeless tobacco: Never   Tobacco comments:    smoke at age [age]  Vaping Use   Vaping Use: Never used  Substance and Sexual Activity   Alcohol use: Not Currently   Drug use: No   Sexual activity: Not  Currently    Partners: Male    Birth control/protection: Abstinence, Pill  Other Topics Concern   Not on file  Social History Narrative   Lives at home alone.   Right-handed.   Occasional caffeine use.   Social Determinants of Health   Financial Resource Strain: Not on file  Food Insecurity: Not on file  Transportation Needs: Not on file  Physical Activity: Not on file  Stress: Not on file  Social Connections: Not on file  Intimate Partner Violence: Not on file   Review of Systems Queasy/nausea. No vomiting Eating and drinking --appetite is fine No loss of smell Bowels looser than normal but not diarrhea Had 2 COVID vaccines--no more after reaction    Objective:   Physical Exam Constitutional:      Appearance: Normal appearance.  Pulmonary:     Effort: Pulmonary effort is normal. No respiratory distress.  Neurological:     Mental Status: She is alert.            Assessment & Plan:

## 2022-04-30 NOTE — Telephone Encounter (Signed)
Starting on 04/28/22 pt has dry cough,head congestion, H/A and scratchy throat. Pt said starting on 04/29/22 pt began feeling very tired. Pt son who lives with pt tested + covid. Pt tested on 04/29/22 and faint line came on covid test; pt does not have fever or CP. Pt said no more SOB than usual due to asthma; pt using inhaler that is helping some. Pt has mychart video visit scheduled for 04/30/22 at 2 PM. Oked per Dr Silvio Pate. UC & ED precautions given and pt voiced understanding. Sending note to Dr Silvio Pate and Silvio Pate pool.

## 2022-04-30 NOTE — Addendum Note (Signed)
Addended by: Pilar Grammes on: 04/30/2022 04:12 PM   Modules accepted: Orders

## 2022-04-30 NOTE — Telephone Encounter (Signed)
[facility] - Client TELEPHONE ADVICE RECORD [facility] Patient Name: Kimberly Patton Gender: Female DOB: [birth year] Age: 66 Y 53 M Return Phone Number: [phone] (Primary) Address: City/ State/ Zip: [place]  [address] Client [facility] - Client Client Site [facility] Provider [name] - MD Contact Type Call Who Is Calling Patient / Member / Family / Caregiver Call Type Triage / Clinical Relationship To Patient Self Return Phone Number [phone] (Primary) Chief Complaint BREATHING - shortness of breath or sounds breathless Reason for Call Symptomatic / Request for Health Information Initial Comment 1/2 Caller states she is covid positive. She is asking for Paxlovid. She is having sore throat, she has chest congestion and cough. She is shortness of breath. Hx of asthma. Translation No Nurse Assessment Nurse: Velta Addison, RN, Crystal Date/Time (Eastern Time): 04/29/2022 9:48:17 AM Confirm and document reason for call. If symptomatic, describe symptoms. ---1/2 Caller states she is covid positive. She is asking for Paxlovid. She is having sore throat, she has chest congestion and cough. She is shortness of breath mild. Hx of asthma. Started symptoms yesterday. Does the patient have any new or worsening symptoms? ---Yes Will a triage be completed? ---Yes Related visit to physician within the last 2 weeks? ---Yes Does the PT have any chronic conditions? (i.e. diabetes, asthma, this includes High risk factors for pregnancy, etc.) ---Yes List chronic conditions. ---HTN, Asthma Is this a behavioral health or substance abuse call? ---No Guidelines Guideline Title Affirmed Question Affirmed Notes Nurse Date/Time (Eastern Time) COVID-19 - Diagnosed or Suspected [1] HIGH RISK patient (e.g., weak immune system, age > 1 years, obesity with BMI 30 or higher,  pregnant, chronic lung disease Parrott, RN, Farley 04/29/2022 9:49:32 AM PLEASE NOTE: All timestamps contained within this report are represented as [place] Standard Time. CONFIDENTIALTY NOTICE: This fax transmission is intended only for the addressee. It contains information that is legally privileged, confidential or otherwise protected from use or disclosure. If you are not the intended recipient, you are strictly prohibited from reviewing, disclosing, copying using or disseminating any of this information or taking any action in reliance on or regarding this information. If you have received this fax in error, please notify us immediately by telephone so that we can arrange for its return to [place]. Phone: [phone], Toll-Free: [phone], Fax: [phone] Page: 2 of 2 Call Id: [id number] Guidelines Guideline Title Affirmed Question Affirmed Notes Nurse Date/Time Eilene Ghazi Time) or other chronic medical condition) AND [2] COVID symptoms (e.g., cough, fever) (Exceptions: Already seen by PCP and no new or worsening symptoms.) Disp. Time Eilene Ghazi Time) Disposition Final User 04/29/2022 9:46:12 AM Send to Urgent Queue Chilton [place]ouse 04/29/2022 9:56:08 AM Call PCP within 24 Hours Yes Velta Addison, RN, Crystal Final Disposition 04/29/2022 9:56:08 AM Call PCP within 24 Hours Yes Parrott, RN, Interior and spatial designer Understands Yes PreDisposition Call Doctor Care Advice Given Per Guideline CALL PCP WITHIN 24 HOURS: * IF OFFICE WILL BE CLOSED: I'll page the on-call provider now. EXCEPTION: from 9 pm to 9 am. Since this isn't urgent, we'll hold the page until morning. CALL BACK IF: * You become worse CARE ADVICE given per COVID-19 - DIAGNOSED OR SUSPECTED (Adult) guideline. Comments User: Hamilton Capri, RN Date/Time (Eastern Time): 04/29/2022 9:52:03 AM Pulse ox reading currently is 98%, 99 bpm User: Hamilton Capri, RN Date/Time (Eastern Time): 04/29/2022  9:57:50 AM Initially advised caller to go to UC due to Mild SOB but  caller declined stating this is her normal SOB that she has been having due to coming off of steroid. Caller is going to call the office tomorrow for Paxlovid prescription Referrals REFERRED TO PCP OFFIC

## 2022-05-01 ENCOUNTER — Ambulatory Visit: Payer: Medicare Other

## 2022-05-01 ENCOUNTER — Ambulatory Visit (INDEPENDENT_AMBULATORY_CARE_PROVIDER_SITE_OTHER): Payer: Medicare Other

## 2022-05-01 DIAGNOSIS — I8310 Varicose veins of unspecified lower extremity with inflammation: Secondary | ICD-10-CM

## 2022-05-01 DIAGNOSIS — I83893 Varicose veins of bilateral lower extremities with other complications: Secondary | ICD-10-CM | POA: Diagnosis not present

## 2022-05-05 ENCOUNTER — Encounter: Payer: Self-pay | Admitting: Family Medicine

## 2022-05-05 ENCOUNTER — Encounter: Payer: Self-pay | Admitting: Cardiology

## 2022-05-05 DIAGNOSIS — M810 Age-related osteoporosis without current pathological fracture: Secondary | ICD-10-CM

## 2022-05-07 ENCOUNTER — Encounter (INDEPENDENT_AMBULATORY_CARE_PROVIDER_SITE_OTHER): Payer: Self-pay | Admitting: Nurse Practitioner

## 2022-05-07 NOTE — Progress Notes (Signed)
Varicose veins of bilateral  lower extremity with inflammation (454.1  I83.10) Current Plans   Indication: Patient presents with symptomatic varicose veins of the bilateral  lower extremity.   Procedure: Sclerotherapy using hypertonic saline mixed with 1% Lidocaine was performed on the bilateral lower extremity. Compression wraps were placed. The patient tolerated the procedure well. 

## 2022-05-08 ENCOUNTER — Ambulatory Visit (INDEPENDENT_AMBULATORY_CARE_PROVIDER_SITE_OTHER): Payer: Medicare Other

## 2022-05-08 VITALS — Ht 66.0 in | Wt 282.0 lb

## 2022-05-08 DIAGNOSIS — Z0000 Encounter for general adult medical examination without abnormal findings: Secondary | ICD-10-CM | POA: Diagnosis not present

## 2022-05-08 NOTE — Patient Instructions (Addendum)
Kimberly Patton , Thank you for taking time to come for your Medicare Wellness Visit. I appreciate your ongoing commitment to your health goals. Please review the following plan we discussed and let me know if I can assist you in the future.   These are the goals we discussed:  Goals       Lose weight (pt-stated)        This is a list of the screening recommended for you and due dates:  Health Maintenance  Topic Date Due   Flu Shot  09/09/2022*   Mammogram  04/11/2023   Medicare Annual Wellness Visit  05/09/2023   DEXA scan (bone density measurement)  08/30/2023   Colon Cancer Screening  05/09/2024   Pneumonia Vaccine  Completed   Hepatitis C Screening: USPSTF Recommendation to screen - Ages 34-79 yo.  Completed   HPV Vaccine  Aged Out   COVID-19 Vaccine  Discontinued   Zoster (Shingles) Vaccine  Discontinued  *Topic was postponed. The date shown is not the original due date.    Advanced directives: Please bring a copy of your health care power of attorney and living will to the office to be added to your chart at your convenience.   Conditions/risks identified: None  Next appointment: Follow up in one year for your annual wellness visit     Preventive Care 65 Years and Older, Female Preventive care refers to lifestyle choices and visits with your health care provider that can promote health and wellness. What does preventive care include? A yearly physical exam. This is also called an annual well check. Dental exams once or twice a year. Routine eye exams. Ask your health care provider how often you should have your eyes checked. Personal lifestyle choices, including: Daily care of your teeth and gums. Regular physical activity. Eating a healthy diet. Avoiding tobacco and drug use. Limiting alcohol use. Practicing safe sex. Taking low-dose aspirin every day. Taking vitamin and mineral supplements as recommended by your health care provider. What happens during an annual  well check? The services and screenings done by your health care provider during your annual well check will depend on your age, overall health, lifestyle risk factors, and family history of disease. Counseling  Your health care provider may ask you questions about your: Alcohol use. Tobacco use. Drug use. Emotional well-being. Home and relationship well-being. Sexual activity. Eating habits. History of falls. Memory and ability to understand (cognition). Work and work Statistician. Reproductive health. Screening  You may have the following tests or measurements: Height, weight, and BMI. Blood pressure. Lipid and cholesterol levels. These may be checked every 5 years, or more frequently if you are over 89 years old. Skin check. Lung cancer screening. You may have this screening every year starting at age 82 if you have a 30-pack-year history of smoking and currently smoke or have quit within the past 15 years. Fecal occult blood test (FOBT) of the stool. You may have this test every year starting at age 10. Flexible sigmoidoscopy or colonoscopy. You may have a sigmoidoscopy every 5 years or a colonoscopy every 10 years starting at age 26. Hepatitis C blood test. Hepatitis B blood test. Sexually transmitted disease (STD) testing. Diabetes screening. This is done by checking your blood sugar (glucose) after you have not eaten for a while (fasting). You may have this done every 1-3 years. Bone density scan. This is done to screen for osteoporosis. You may have this done starting at age 90. Mammogram. This may  be done every 1-2 years. Talk to your health care provider about how often you should have regular mammograms. Talk with your health care provider about your test results, treatment options, and if necessary, the need for more tests. Vaccines  Your health care provider may recommend certain vaccines, such as: Influenza vaccine. This is recommended every year. Tetanus, diphtheria,  and acellular pertussis (Tdap, Td) vaccine. You may need a Td booster every 10 years. Zoster vaccine. You may need this after age 82. Pneumococcal 13-valent conjugate (PCV13) vaccine. One dose is recommended after age 95. Pneumococcal polysaccharide (PPSV23) vaccine. One dose is recommended after age 73. Talk to your health care provider about which screenings and vaccines you need and how often you need them. This information is not intended to replace advice given to you by your health care provider. Make sure you discuss any questions you have with your health care provider. Document Released: 06/24/2015 Document Revised: 02/15/2016 Document Reviewed: 03/29/2015 Elsevier Interactive Patient Education  2017 [employer] [place] Prevention in the Home Falls can cause injuries. They can happen to people of all ages. There are many things you can do to make your home safe and to help prevent falls. What can I do on the outside of my home? Regularly fix the edges of walkways and driveways and fix any cracks. Remove anything that might make you trip as you walk through a door, such as a raised step or threshold. Trim any bushes or trees on the path to your home. Use bright outdoor lighting. Clear any walking paths of anything that might make someone trip, such as rocks or tools. Regularly check to see if handrails are loose or broken. Make sure that both sides of any steps have handrails. Any raised decks and porches should have guardrails on the edges. Have any leaves, snow, or ice cleared regularly. Use sand or salt on walking paths during winter. Clean up any spills in your garage right away. This includes oil or grease spills. What can I do in the bathroom? Use night lights. Install grab bars by the toilet and in the tub and shower. Do not use towel bars as grab bars. Use non-skid mats or decals in the tub or shower. If you need to sit down in the shower, use a plastic, non-slip  stool. Keep the floor dry. Clean up any water that spills on the floor as soon as it happens. Remove soap buildup in the tub or shower regularly. Attach bath mats securely with double-sided non-slip rug tape. Do not have throw rugs and other things on the floor that can make you trip. What can I do in the bedroom? Use night lights. Make sure that you have a light by your bed that is easy to reach. Do not use any sheets or blankets that are too big for your bed. They should not hang down onto the floor. Have a firm chair that has side arms. You can use this for support while you get dressed. Do not have throw rugs and other things on the floor that can make you trip. What can I do in the kitchen? Clean up any spills right away. Avoid walking on wet floors. Keep items that you use a lot in easy-to-reach places. If you need to reach something above you, use a strong step stool that has a grab bar. Keep electrical cords out of the way. Do not use floor polish or wax that makes floors slippery. If you must use  wax, use non-skid floor wax. Do not have throw rugs and other things on the floor that can make you trip. What can I do with my stairs? Do not leave any items on the stairs. Make sure that there are handrails on both sides of the stairs and use them. Fix handrails that are broken or loose. Make sure that handrails are as long as the stairways. Check any carpeting to make sure that it is firmly attached to the stairs. Fix any carpet that is loose or worn. Avoid having throw rugs at the top or bottom of the stairs. If you do have throw rugs, attach them to the floor with carpet tape. Make sure that you have a light switch at the top of the stairs and the bottom of the stairs. If you do not have them, ask someone to add them for you. What else can I do to help prevent falls? Wear shoes that: Do not have high heels. Have rubber bottoms. Are comfortable and fit you well. Are closed at the  toe. Do not wear sandals. If you use a stepladder: Make sure that it is fully opened. Do not climb a closed stepladder. Make sure that both sides of the stepladder are locked into place. Ask someone to hold it for you, if possible. Clearly mark and make sure that you can see: Any grab bars or handrails. First and last steps. Where the edge of each step is. Use tools that help you move around (mobility aids) if they are needed. These include: Canes. Walkers. Scooters. Crutches. Turn on the lights when you go into a dark area. Replace any light bulbs as soon as they burn out. Set up your furniture so you have a clear path. Avoid moving your furniture around. If any of your floors are uneven, fix them. If there are any pets around you, be aware of where they are. Review your medicines with your doctor. Some medicines can make you feel dizzy. This can increase your chance of falling. Ask your doctor what other things that you can do to help prevent falls. This information is not intended to replace advice given to you by your health care provider. Make sure you discuss any questions you have with your health care provider. Document Released: 03/24/2009 Document Revised: 11/03/2015 Document Reviewed: 07/02/2014 Elsevier Interactive Patient Education  2017 [employer].

## 2022-05-08 NOTE — Progress Notes (Signed)
Subjective:   Kimberly Patton is a 66 y.o. female who presents for Medicare Annual (Subsequent) preventive examination.  Review of Systems    Virtual Visit via Telephone Note  I connected with  Kimberly Patton on 05/08/22 at  3:15 PM EST by telephone and verified that I am speaking with the correct person using two identifiers.  Location: Patient: Home Provider: Office Persons participating in the virtual visit: patient/Nurse Health Advisor   I discussed the limitations, risks, security and privacy concerns of performing an evaluation and management service by telephone and the availability of in person appointments. The patient expressed understanding and agreed to proceed.  Interactive audio and video telecommunications were attempted between this nurse and patient, however failed, due to patient having technical difficulties OR patient did not have access to video capability.  We continued and completed visit with audio only.  Some vital signs may be absent or patient reported.   Kimberly Peaches, LPN  Cardiac Risk Factors include: advanced age (>39mn, >>81women);hypertension     Objective:    Today's Vitals   05/08/22 1520  Weight: 282 lb (127.9 kg)  Height: '5\' 6"'$  (1.676 m)   Body mass index is 45.52 kg/m.     05/08/2022    3:31 PM 03/24/2022   10:38 AM 03/21/2022    6:10 AM 03/20/2022    1:30 PM 12/05/2021    6:58 PM 06/23/2021    8:36 AM 05/09/2021    9:56 AM  Advanced Directives  Does Patient Have a Medical Advance Directive? Yes Yes Yes Yes No Yes Yes  Type of AParamedicof [place]ug[place]Living will [place]Living will HIndustryLiving will [place]Living will  Healthcare Power of [place]lynneLiving will  Does patient want to make changes to medical advance directive?   No - Patient declined   No - Patient declined   Copy of HComal in Chart? No - copy requested  No - copy requested   No - copy requested No - copy requested    Current Medications (verified) Outpatient Encounter Medications as of 05/08/2022  Medication Sig   albuterol (VENTOLIN HFA) 108 (90 Base) MCG/ACT inhaler Inhale 2 puffs into the lungs every 6 (six) hours as needed for wheezing or shortness of breath.   budesonide-formoterol (SYMBICORT) 160-4.5 MCG/ACT inhaler Inhale 2 puffs into the lungs 2 (two) times daily.   Calcium Citrate-Vitamin D (CITRACAL + D PO) Take 2 tablets by mouth daily.    Cholecalciferol (VITAMIN D3) 1.25 MG (50000 UT) CAPS Take 1 capsule by mouth once a week.   Coenzyme Q10-Vitamin E (QUNOL ULTRA COQ10) 100-150 MG-UNIT CAPS Take 100 mg by mouth daily.   Cyanocobalamin (VITAMIN B-12) 5000 MCG SUBL Place 5,000 mcg under the tongue daily.   denosumab (PROLIA) 60 MG/ML SOSY injection  (Patient not taking: Reported on 04/30/2022)   diltiazem (CARDIZEM SR) 120 MG 12 hr capsule Take 1 capsule (120 mg total) by mouth 2 (two) times daily.   diltiazem (CARDIZEM) 30 MG tablet Take 1 tablet (30 mg total) by mouth daily as needed (for elevated Heart rate).   ELDERBERRY PO Take 1,000 mg by mouth daily.    famotidine (PEPCID) 40 MG tablet Take 40 mg by mouth at bedtime.   fluticasone (FLONASE) 50 MCG/ACT nasal spray SPRAY 2 SPRAYS INTO EACH NOSTRIL EVERY DAY   furosemide (LASIX) 20 MG tablet Take 1 tablet (20 mg  total) by mouth daily.   gabapentin (NEURONTIN) 300 MG capsule Take 300 mg by mouth daily.   Lactobacillus Rhamnosus, GG, (CULTURELLE IMMUNITY SUPPORT PO) Take 1 capsule by mouth daily.   Menaquinone-7 (VITAMIN K2 PO) Take 1 tablet by mouth daily.   montelukast (SINGULAIR) 10 MG tablet TAKE 1 TABLET BY MOUTH EVERYDAY AT BEDTIME   Multiple Vitamins-Minerals (CENTRUM SILVER 50+WOMEN PO) Take 1 tablet by mouth daily.   nitroGLYCERIN (NITROSTAT) 0.4 MG SL tablet Place 1 tablet (0.4 mg total) under the tongue every 5 (five) minutes as needed  for chest pain.   Omega-3 Fatty Acids (FISH OIL) 1200 MG CAPS Take 1 capsule by mouth daily.   pantoprazole (PROTONIX) 40 MG tablet TAKE 1 TABLET BY MOUTH EVERY DAY   Riboflavin 100 MG CAPS Take 1 capsule by mouth daily.   risedronate (ACTONEL) 35 MG tablet Take 1 tablet (35 mg total) by mouth every 7 (seven) days. with water on empty stomach, nothing by mouth or lie down for next 30 minutes.Make sure this is actonel EC/coated (Patient not taking: Reported on 04/30/2022)   rosuvastatin (CRESTOR) 10 MG tablet Take by mouth.   TURMERIC PO Take 1 tablet by mouth daily.   Facility-Administered Encounter Medications as of 05/08/2022  Medication   albuterol (PROVENTIL) (2.5 MG/3ML) 0.083% nebulizer solution 2.5 mg    Allergies (verified) Wasp venom, Chlorhexidine gluconate, Metronidazole, Prednisone, and Sodium hypochlorite   History: Past Medical History:  Diagnosis Date   ALLERGIC RHINITIS 04/03/2007   Allergy    Not sure   Anemia    ((Pt Qnr Sub: Denies at visit from 08/28/2021 with endocrinology))    Anginal pain ([place])    Anxiety    Arthritis Not sure   Asthma    hx of years ago - no longer a problem    ASTHMA, PERSISTENT, MODERATE 04/03/2007   Cancer ([place])    skin    Dyspnea on exertion    a. 11/2007 Echo: EF 60%.   Dysrhythmia    hx of heart arrhythmia 10-15 years ago - followed by DR Lovena Le - not seen in years    GERD (gastroesophageal reflux disease)    Hemophilia carrier    Hypertension    Joint pain    HIPS / LEGS   Knee injury    RT   Meningioma ([place])    Morbid obesity ([place])    Neuromuscular disorder ([place]) Not sure, since my fall   Paroxysmal SVT (supraventricular tachycardia)    a. 11/2011 48h Holter: RSR, rare PVC's, occas PAC's   Pre-diabetes    Retinal tear of right eye 01/2016   Ventral hernia    Past Surgical History:  Procedure Laterality Date   [place]   ARTERY BIOPSY Left 03/21/2022   Procedure: BIOPSY  TEMPORAL ARTERY;  Surgeon: Katha Cabal, MD;  Location: [facility] ORS;  Service: Vascular;  Laterality: Left;   BIOPSY THYROID     08/2021   BREAST CYST EXCISION Right 1994   Benign   CESAREAN SECTION     x2   CHOLECYSTECTOMY  1995   COLONOSCOPY WITH PROPOFOL N/A 05/09/2021   Procedure: COLONOSCOPY WITH PROPOFOL;  Surgeon: [name], MD;  Location: [facility] ENDOSCOPY;  Service: Gastroenterology;  Laterality: N/A;   EYE SURGERY  01/2016   Repair retinal tear   FOOT SURGERY  1995 / 1996   x2   HERNIA REPAIR     KNEE ARTHROSCOPY W/ MENISCAL REPAIR  07/25/2013   right knee Dr. Mardelle Matte   LAPAROSCOPIC GASTRIC SLEEVE RESECTION N/A 06/08/2013   Procedure: LAPAROSCOPIC GASTRIC SLEEVE RESECTION AND EXCISION OF SEBACEUS CYST FROM MID CHEST takedown of incarcerated ventral hernia and primary repair endoscopy;  Surgeon: [name], MD;  Location: [facility] ORS;  Service: General;  Laterality: N/A;   LAPAROSCOPIC NISSEN FUNDOPLICATION N/A 80/32/1224   Procedure: LAPAROSCOPIC REPAIR LARGE SYMPTOMATIC HIATAL HERNIA WITH UPPER ENDOSCOPY;  Surgeon: Johnathan Hausen, MD;  Location: [facility] ORS;  Service: General;  Laterality: N/A;   moles  06/2013   removed 2 moles from under arm and  lowback   NECK SURGERY     occipital nerve damage- injections    OVARIAN CYST [place] EXCISIONAL BREAST BIOPSY Right 06/23/2021   Procedure: RADIOACTIVE SEED GUIDED EXCISIONAL RIGHT BREAST BIOPSY;  Surgeon: Johnathan Hausen, MD;  Location: [facility];  Service: General;  Laterality: Right;   RETINAL TEAR REPAIR CRYOTHERAPY Right 01/2016   [place]   SKIN CANCER EXCISION     TONSILLECTOMY     TUBAL LIGATION  Think 1994   UPPER GI ENDOSCOPY  06/08/2013   Procedure: UPPER GI ENDOSCOPY;  Surgeon: [name], MD;  Location: [facility] ORS;  Service: General;;   US ECHOCARDIOGRAPHY  12/2011   WNL - EF 55-60%, mild MR, grade 1 diastolic dysnfiction (mild)    VENTRAL HERNIA REPAIR  2000   Family History  Problem Relation Age of Onset   Breast cancer Mother    Diabetes Mother    Hypertension Mother    Kidney failure Mother    Diabetes Father    Hypertension Father    Diabetes Other    Hypertension Other    Stroke Other    Hemophilia Other    Social History   Socioeconomic History   Marital status: Divorced    Spouse name: Not on file   Number of children: 2   Years of education: 23   Highest education level: Not on file  Occupational History   Occupation: Product manager: [employer]  Tobacco Use   Smoking status: Never   Smokeless tobacco: Never   Tobacco comments:    smoke at age [age]  Vaping Use   Vaping Use: Never used  Substance and Sexual Activity   Alcohol use: Not Currently   Drug use: No   Sexual activity: Not Currently    Partners: Male    Birth control/protection: Abstinence, Pill  Other Topics Concern   Not on file  Social History Narrative   Lives at home alone.   Right-handed.   Occasional caffeine use.   Social Determinants of Health   Financial Resource Strain: Low Risk  (05/08/2022)   Overall Financial Resource Strain (CARDIA)    Difficulty of Paying Living Expenses: Not hard at all  Food Insecurity: No Food Insecurity (05/08/2022)   Hunger Vital Sign    Worried About Running Out of Food in the Last Year: Never true    Ran Out of Food in the Last Year: Never true  Transportation Needs: No Transportation Needs (05/08/2022)   PRAPARE - Hydrologist (Medical): No    Lack of Transportation (Non-Medical): No  Physical Activity: Inactive (05/08/2022)   Exercise Vital Sign    Days of Exercise per Week: 0 days    Minutes of Exercise per Session: 0 min  Stress: No Stress Concern  Present (05/08/2022)   [place]    Feeling of Stress : Not at all  Social Connections: Moderately Integrated  (05/08/2022)   Social Connection and Isolation Panel [NHANES]    Frequency of Communication with Friends and Family: More than three times a week    Frequency of Social Gatherings with Friends and Family: More than three times a week    Attends Religious Services: More than 4 times per year    Active Member of [employer] or Organizations: Yes    Attends Music therapist: More than 4 times per year    Marital Status: Divorced    Tobacco Counseling Counseling given: Not Answered Tobacco comments: smoke at age [age]   Clinical Intake:  Pre-visit preparation completed: No  Pain : No/denies pain     BMI - recorded: 45.52 Nutritional Status: BMI > 30  Obese Nutritional Risks: None Diabetes: No  How often do you need to have someone help you when you read instructions, pamphlets, or other written materials from your doctor or pharmacy?: 1 - Never  Diabetic?  No  Interpreter Needed?: No  Information entered by :: Rolene Arbour LPN   Activities of Daily Living    05/08/2022    3:30 PM 03/21/2022    6:15 AM  In your present state of health, do you have any difficulty performing the following activities:  Hearing? 0 0  Vision? 0 0  Difficulty concentrating or making decisions? 0 0  Walking or climbing stairs? 0 0  Dressing or bathing? 0 0  Doing errands, shopping? 0   Preparing Food and eating ? N   Using the Toilet? N   In the past six months, have you accidently leaked urine? N   Do you have problems with loss of bowel control? N   Managing your Medications? N   Managing your Finances? N   Housekeeping or managing your Housekeeping? N     Patient Care Team: Jinny Sanders, MD as PCP - General [name], MD as PCP - Cardiology (Cardiology) Heniford, Esperanza Sheets, MD as Referring Physician (Surgery) Himmelrich, Bryson Ha, RD (Inactive) as Dietitian Tia Masker)  Indicate any recent Medical Services you may have received from other than [facility] providers in  the past year (date may be approximate).     Assessment:   This is a routine wellness examination for Kimberly Patton.  Hearing/Vision screen Hearing Screening - Comments:: Denies hearing difficulties   Vision Screening - Comments:: Wears rx glasses - up to date with routine eye exams with  [place] issues and exercise activities discussed: Exercise limited by: None identified   Goals Addressed               This Visit's Progress     Lose weight (pt-stated)         Depression Screen    05/08/2022    3:29 PM 03/09/2021    2:49 PM 02/17/2020    1:37 PM 06/09/2019   12:39 PM 10/04/2017    2:45 PM 12/07/2016    9:23 AM 04/07/2012    8:06 AM  PHQ 2/9 Scores  PHQ - 2 Score 0 0 1 0 0 2 0  PHQ- 9 Score    4  5     Fall Risk    05/08/2022    3:30 PM 03/28/2022    8:46 AM 03/09/2021    2:06 PM 02/17/2020    1:37 PM  Fall  Risk   Falls in the past year? 0 0 1 0  Number falls in past yr: 0 0 1   Injury with Fall? 0  1   Risk for fall due to : No Fall Risks     Follow up Falls prevention discussed       [place]:  Any stairs in or around the home? Yes  If so, are there any without handrails? No  Home free of loose throw rugs in walkways, pet beds, electrical cords, etc? Yes  Adequate lighting in your home to reduce risk of falls? Yes   ASSISTIVE DEVICES UTILIZED TO PREVENT FALLS:  Life alert? No  Use of a cane, walker or w/c? No  Grab bars in the bathroom? No  Shower chair or bench in shower? Yes  Elevated toilet seat or a handicapped toilet? Yes   TIMED UP AND GO:  Was the test performed? No . Audio Visit   Cognitive Function:        05/08/2022    3:31 PM  6CIT Screen  What Year? 0 points  What month? 0 points  What time? 0 points  Count back from 20 0 points  Months in reverse 0 points  Repeat phrase 0 points  Total Score 0 points    Immunizations Immunization History  Administered Date(s)  Administered   Fluad Quad(high Dose 65+) 03/09/2021   Influenza Inj Mdck Quad Pf 02/13/2018   Influenza Whole 06/11/2005, 04/08/2009   Influenza,inj,Quad PF,6+ Mos 03/22/2017, 02/13/2018, 03/03/2019, 04/08/2020   Influenza-Unspecified 03/27/2016   PFIZER(Purple Top)SARS-COV-2 Vaccination 08/07/2019, 08/29/2019   PNEUMOCOCCAL CONJUGATE-20 03/09/2021   Pneumococcal Polysaccharide-23 03/22/2017   Td 06/11/2004   Tdap 01/20/2015      Flu Vaccine status: Up to date  Pneumococcal vaccine status: Up to date  Covid-19 vaccine status: Completed vaccines   Screening Tests Health Maintenance  Topic Date Due   INFLUENZA VACCINE  09/09/2022 (Originally 01/09/2022)   MAMMOGRAM  04/11/2023   Medicare Annual Wellness (AWV)  05/09/2023   DEXA SCAN  08/30/2023   COLONOSCOPY (Pts 45-45yr Insurance coverage will need to be confirmed)  05/09/2024   Pneumonia Vaccine 66 Years old  Completed   Hepatitis C Screening  Completed   HPV VACCINES  Aged Out   COVID-19 Vaccine  Discontinued   Zoster Vaccines- Shingrix  Discontinued    Health Maintenance  There are no preventive care reminders to display for this patient.   Colorectal cancer screening: Type of screening: Colonoscopy. Completed 05/09/21. Repeat every 3 years  Mammogram status: Completed 04/10/21. Repeat every year  Bone Density status: Completed 08/29/21. Results reflect: Bone density results: OSTEOPOROSIS. Repeat every   years.  Lung Cancer Screening: (Low Dose CT Chest recommended if Age 66-80years, 30 pack-year currently smoking OR have quit w/in 15years.) does not qualify.     Additional Screening:  Hepatitis C Screening: does qualify; Completed 01/13/15  Vision Screening: Recommended annual ophthalmology exams for early detection of glaucoma and other disorders of the eye. Is the patient up to date with their annual eye exam?  Yes  Who is the provider or what is the name of the office in which the patient attends annual  eye exams? ATateIf pt is not established with a provider, would they like to be referred to a provider to establish care? No .   Dental Screening: Recommended annual dental exams for proper oral hygiene  Community Resource Referral / Chronic Care Management:  CRR required this visit?  No   CCM required this visit?  No      Plan:     I have personally reviewed and noted the following in the patient's chart:   Medical and social history Use of alcohol, tobacco or illicit drugs  Current medications and supplements including opioid prescriptions. Patient is not currently taking opioid prescriptions. Functional ability and status Nutritional status Physical activity Advanced directives List of other physicians Hospitalizations, surgeries, and ER visits in previous 12 months Vitals Screenings to include cognitive, depression, and falls Referrals and appointments  In addition, I have reviewed and discussed with patient certain preventive protocols, quality metrics, and best practice recommendations. A written personalized care plan for preventive services as well as general preventive health recommendations were provided to patient.     Kimberly Peaches, LPN   78/24/2353   Nurse Notes: Patient request, Please check My Chart has concerns of current medications and labs.

## 2022-05-11 ENCOUNTER — Encounter: Payer: Self-pay | Admitting: Family Medicine

## 2022-05-11 ENCOUNTER — Other Ambulatory Visit (INDEPENDENT_AMBULATORY_CARE_PROVIDER_SITE_OTHER): Payer: Medicare Other

## 2022-05-11 DIAGNOSIS — M810 Age-related osteoporosis without current pathological fracture: Secondary | ICD-10-CM

## 2022-05-11 DIAGNOSIS — E7800 Pure hypercholesterolemia, unspecified: Secondary | ICD-10-CM | POA: Diagnosis not present

## 2022-05-11 LAB — COMPREHENSIVE METABOLIC PANEL
ALT: 14 U/L (ref 0–35)
AST: 13 U/L (ref 0–37)
Albumin: 4.2 g/dL (ref 3.5–5.2)
Alkaline Phosphatase: 70 U/L (ref 39–117)
BUN: 25 mg/dL — ABNORMAL HIGH (ref 6–23)
CO2: 29 mEq/L (ref 19–32)
Calcium: 9.3 mg/dL (ref 8.4–10.5)
Chloride: 102 mEq/L (ref 96–112)
Creatinine, Ser: 0.58 mg/dL (ref 0.40–1.20)
GFR: 94.23 mL/min (ref >60.00)
Glucose, Bld: 85 mg/dL (ref 70–99)
Potassium: 3.6 mEq/L (ref 3.5–5.1)
Sodium: 140 mEq/L (ref 135–145)
Total Bilirubin: 0.4 mg/dL (ref 0.2–1.2)
Total Protein: 7.4 g/dL (ref 6.0–8.3)

## 2022-05-11 LAB — HEMOGLOBIN A1C: Hgb A1c MFr Bld: 6 % (ref 4.6–6.5)

## 2022-05-11 LAB — LIPID PANEL
Cholesterol: 242 mg/dL — ABNORMAL HIGH (ref 0–200)
HDL: 65 mg/dL (ref >39.00)
LDL Cholesterol: 143 mg/dL — ABNORMAL HIGH (ref 0–99)
NonHDL: 176.72
Total CHOL/HDL Ratio: 4
Triglycerides: 171 mg/dL — ABNORMAL HIGH (ref 0.0–149.0)
VLDL: 34.2 mg/dL (ref 0.0–40.0)

## 2022-05-11 NOTE — Progress Notes (Signed)
No critical labs need to be addressed urgently. We will discuss labs in detail at upcoming office visit.   

## 2022-05-14 LAB — C-TERMINAL TELOPEPTIDE: C-Telopeptide (CTx): 161 pg/mL

## 2022-05-17 ENCOUNTER — Encounter: Payer: Self-pay | Admitting: Family Medicine

## 2022-05-17 ENCOUNTER — Encounter: Payer: Self-pay | Admitting: Cardiology

## 2022-05-17 ENCOUNTER — Ambulatory Visit: Payer: Medicare Other | Attending: Cardiology | Admitting: Cardiology

## 2022-05-17 VITALS — BP 144/88 | HR 74 | Ht 66.0 in | Wt 286.8 lb

## 2022-05-17 DIAGNOSIS — R600 Localized edema: Secondary | ICD-10-CM

## 2022-05-17 DIAGNOSIS — I10 Essential (primary) hypertension: Secondary | ICD-10-CM

## 2022-05-17 DIAGNOSIS — E7800 Pure hypercholesterolemia, unspecified: Secondary | ICD-10-CM | POA: Diagnosis not present

## 2022-05-17 DIAGNOSIS — I471 Supraventricular tachycardia, unspecified: Secondary | ICD-10-CM

## 2022-05-17 DIAGNOSIS — I480 Paroxysmal atrial fibrillation: Secondary | ICD-10-CM | POA: Diagnosis not present

## 2022-05-17 MED ORDER — SEMAGLUTIDE-WEIGHT MANAGEMENT 1.7 MG/0.75ML ~~LOC~~ SOAJ: 1.7000 mg | SUBCUTANEOUS | 0 refills | Status: DC

## 2022-05-17 MED ORDER — SEMAGLUTIDE-WEIGHT MANAGEMENT 1 MG/0.5ML ~~LOC~~ SOAJ: 1.0000 mg | SUBCUTANEOUS | 0 refills | Status: DC

## 2022-05-17 MED ORDER — SEMAGLUTIDE-WEIGHT MANAGEMENT 0.25 MG/0.5ML ~~LOC~~ SOAJ: 0.2500 mg | SUBCUTANEOUS | 0 refills | Status: DC

## 2022-05-17 MED ORDER — HYDROCHLOROTHIAZIDE 25 MG PO TABS: 25.0000 mg | ORAL_TABLET | Freq: Every day | ORAL | 3 refills | Status: DC

## 2022-05-17 MED ORDER — NITROGLYCERIN 0.4 MG SL SUBL: 0.4000 mg | SUBLINGUAL_TABLET | SUBLINGUAL | 3 refills | Status: AC | PRN

## 2022-05-17 MED ORDER — ROSUVASTATIN CALCIUM 10 MG PO TABS: 10.0000 mg | ORAL_TABLET | Freq: Every day | ORAL | 3 refills | Status: DC

## 2022-05-17 MED ORDER — CARDIZEM 30 MG PO TABS: 30.0000 mg | ORAL_TABLET | Freq: Every day | ORAL | 3 refills | Status: DC | PRN

## 2022-05-17 MED ORDER — SEMAGLUTIDE-WEIGHT MANAGEMENT 0.5 MG/0.5ML ~~LOC~~ SOAJ: 0.5000 mg | SUBCUTANEOUS | 0 refills | Status: DC

## 2022-05-17 MED ORDER — SEMAGLUTIDE-WEIGHT MANAGEMENT 2.4 MG/0.75ML ~~LOC~~ SOAJ: 2.4000 mg | SUBCUTANEOUS | 0 refills | Status: DC

## 2022-05-17 NOTE — Progress Notes (Signed)
Cardiology Office Note:    Date:  05/17/2022   ID:  Kimberly Patton, DOB [birth year], MRN [id number]  PCP:  Jinny Sanders, MD  [facility] Cardiologist:  [name], MD  [facility] Electrophysiologist:  None   Referring MD: Jinny Sanders, MD   Chief Complaint  Patient presents with   Follow-up    6 week follow up, Irregular labs     History of Present Illness:    Kimberly Patton is a 66 y.o. female with a hx of hypertension, hiatal hernia, GERD, iron deficiency anemia, SVT on diltiazem presenting for follow-up.  Previously seen due to brief episode of atrial fibrillation in the context of steroid use and temporal artery biopsy.  Steroid therapy has been tapered off, cardiac monitor was placed to evaluate any recurrence of A-fib.  She has been trying to lose weight unsuccessfully.  Denies palpitations.  Has appointment with lymphedema clinic next month.  BP has been elevated since starting steroid therapy.  Prior notes Cardiac monitor 04/2022 no A-fib, occasional paroxysmal SVT Echocardiogram 01/2020 showed normal systolic and diastolic function, EF 60 to 65%.  Lexiscan Myoview 01/2020 with no evidence for ischemia.  Has a history of hypokalemia while taking HCTZ. Losartan was started after stopping HCTZ. Developed nausea with losartan. This was stopped.   Past Medical History:  Diagnosis Date   ALLERGIC RHINITIS 04/03/2007   Allergy    Not sure   Anemia    ((Pt Qnr Sub: Denies at visit from 08/28/2021 with endocrinology))    Anginal pain ([place])    Anxiety    Arthritis Not sure   Asthma    hx of years ago - no longer a problem    ASTHMA, PERSISTENT, MODERATE 04/03/2007   Cancer ([place])    skin    Dyspnea on exertion    a. 11/2007 Echo: EF 60%.   Dysrhythmia    hx of heart arrhythmia 10-15 years ago - followed by DR Lovena Le - not seen in years    GERD (gastroesophageal reflux disease)    Hemophilia carrier    Hypertension    Joint pain    HIPS / LEGS    Knee injury    RT   Meningioma ([place])    Morbid obesity ([place])    Neuromuscular disorder ([place]) Not sure, since my fall   Paroxysmal SVT (supraventricular tachycardia)    a. 11/2011 48h Holter: RSR, rare PVC's, occas PAC's   Pre-diabetes    Retinal tear of right eye 01/2016   Ventral hernia     Past Surgical History:  Procedure Laterality Date   [place]   ARTERY BIOPSY Left 03/21/2022   Procedure: BIOPSY TEMPORAL ARTERY;  Surgeon: Katha Cabal, MD;  Location: [facility] ORS;  Service: Vascular;  Laterality: Left;   BIOPSY THYROID     08/2021   BREAST CYST EXCISION Right 1994   Benign   CESAREAN SECTION     x2   CHOLECYSTECTOMY  1995   COLONOSCOPY WITH PROPOFOL N/A 05/09/2021   Procedure: COLONOSCOPY WITH PROPOFOL;  Surgeon: [name], MD;  Location[facility] ENDOSCOPY;  Service: Gastroenterology;  Laterality: N/A;   EYE SURGERY  01/2016   Repair retinal tear   FOOT SURGERY  1995 / 1996   x2   HERNIA REPAIR     KNEE ARTHROSCOPY W/ MENISCAL REPAIR  07/25/2013   right knee Dr. Mardelle Matte   LAPAROSCOPIC GASTRIC SLEEVE RESECTION N/A 06/08/2013  Procedure: LAPAROSCOPIC GASTRIC SLEEVE RESECTION AND EXCISION OF SEBACEUS CYST FROM MID CHEST takedown of incarcerated ventral hernia and primary repair endoscopy;  Surgeon: [name], MD;  Location: [facility] ORS;  Service: General;  Laterality: N/A;   LAPAROSCOPIC NISSEN FUNDOPLICATION N/A 52/84/1324   Procedure: LAPAROSCOPIC REPAIR LARGE SYMPTOMATIC HIATAL HERNIA WITH UPPER ENDOSCOPY;  Surgeon: Johnathan Hausen, MD;  Location: [facility] ORS;  Service: General;  Laterality: N/A;   moles  06/2013   removed 2 moles from under arm and  lowback   NECK SURGERY     occipital nerve damage- injections    OVARIAN CYST [place] EXCISIONAL BREAST BIOPSY Right 06/23/2021   Procedure: RADIOACTIVE SEED GUIDED EXCISIONAL RIGHT BREAST BIOPSY;  Surgeon:  Johnathan Hausen, MD;  Location: [place];  Service: General;  Laterality: Right;   RETINAL TEAR REPAIR CRYOTHERAPY Right 01/2016   [place]   SKIN CANCER EXCISION     TONSILLECTOMY     TUBAL LIGATION  Think 1994   UPPER GI ENDOSCOPY  06/08/2013   Procedure: UPPER GI ENDOSCOPY;  Surgeon: [name], MD;  Location: [facility];  Service: General;;   US ECHOCARDIOGRAPHY  12/2011   WNL - EF 55-60%, mild MR, grade 1 diastolic dysnfiction (mild)   VENTRAL HERNIA REPAIR  2000    Current Medications: Current Meds  Medication Sig   albuterol (VENTOLIN HFA) 108 (90 Base) MCG/ACT inhaler Inhale 2 puffs into the lungs every 6 (six) hours as needed for wheezing or shortness of breath.   budesonide-formoterol (SYMBICORT) 160-4.5 MCG/ACT inhaler Inhale 2 puffs into the lungs 2 (two) times daily.   Calcium Citrate-Vitamin D (CITRACAL + D PO) Take 2 tablets by mouth daily.    Cholecalciferol (VITAMIN D3) 1.25 MG (50000 UT) CAPS Take 1 capsule by mouth once a week.   Coenzyme Q10-Vitamin E (QUNOL ULTRA COQ10) 100-150 MG-UNIT CAPS Take 100 mg by mouth daily.   Cyanocobalamin (VITAMIN B-12) 5000 MCG SUBL Place 5,000 mcg under the tongue daily.   denosumab (PROLIA) 60 MG/ML SOSY injection    diltiazem (CARDIZEM SR) 120 MG 12 hr capsule Take 1 capsule (120 mg total) by mouth 2 (two) times daily.   ELDERBERRY PO Take 1,000 mg by mouth daily.    famotidine (PEPCID) 40 MG tablet Take 40 mg by mouth at bedtime.   fluticasone (FLONASE) 50 MCG/ACT nasal spray SPRAY 2 SPRAYS INTO EACH NOSTRIL EVERY DAY   furosemide (LASIX) 20 MG tablet Take 1 tablet (20 mg total) by mouth daily.   gabapentin (NEURONTIN) 300 MG capsule Take 300 mg by mouth daily.   hydrochlorothiazide (HYDRODIURIL) 25 MG tablet Take 1 tablet (25 mg total) by mouth daily.   Lactobacillus Rhamnosus, GG, (CULTURELLE IMMUNITY SUPPORT PO) Take 1 capsule by mouth daily.   Menaquinone-7 (VITAMIN K2 PO) Take 1 tablet by mouth daily.    montelukast (SINGULAIR) 10 MG tablet TAKE 1 TABLET BY MOUTH EVERYDAY AT BEDTIME   Multiple Vitamins-Minerals (CENTRUM SILVER 50+WOMEN PO) Take 1 tablet by mouth daily.   Omega-3 Fatty Acids (FISH OIL) 1200 MG CAPS Take 1 capsule by mouth daily.   pantoprazole (PROTONIX) 40 MG tablet TAKE 1 TABLET BY MOUTH EVERY DAY   Riboflavin 100 MG CAPS Take 1 capsule by mouth daily.   risedronate (ACTONEL) 35 MG tablet Take 1 tablet (35 mg total) by mouth every 7 (seven) days. with water on empty stomach, nothing by mouth or  lie down for next 30 minutes.Make sure this is actonel EC/coated   Semaglutide-Weight Management 0.25 MG/0.5ML SOAJ Inject 0.25 mg into the skin once a week for 28 days.   [START ON 06/15/2022] Semaglutide-Weight Management 0.5 MG/0.5ML SOAJ Inject 0.5 mg into the skin once a week for 28 days.   [START ON 07/14/2022] Semaglutide-Weight Management 1 MG/0.5ML SOAJ Inject 1 mg into the skin once a week for 28 days.   [START ON 08/12/2022] Semaglutide-Weight Management 1.7 MG/0.75ML SOAJ Inject 1.7 mg into the skin once a week for 28 days.   [START ON 09/10/2022] Semaglutide-Weight Management 2.4 MG/0.75ML SOAJ Inject 2.4 mg into the skin once a week for 28 days.   TURMERIC PO Take 1 tablet by mouth daily.   [DISCONTINUED] diltiazem (CARDIZEM) 30 MG tablet Take 1 tablet (30 mg total) by mouth daily as needed (for elevated Heart rate).   [DISCONTINUED] nitroGLYCERIN (NITROSTAT) 0.4 MG SL tablet Place 1 tablet (0.4 mg total) under the tongue every 5 (five) minutes as needed for chest pain.   [DISCONTINUED] rosuvastatin (CRESTOR) 10 MG tablet Take by mouth.     Allergies:   Wasp venom, Chlorhexidine gluconate, Metronidazole, Prednisone, and Sodium hypochlorite   Social History   Socioeconomic History   Marital status: Divorced    Spouse name: Not on file   Number of children: 2   Years of education: 17   Highest education level: Not on file  Occupational History   Occupation: Associate Professor: [employer]  Tobacco Use   Smoking status: Never   Smokeless tobacco: Never   Tobacco comments:    smoke at age [age]  Vaping Use   Vaping Use: Never used  Substance and Sexual Activity   Alcohol use: Not Currently   Drug use: No   Sexual activity: Not Currently    Partners: Male    Birth control/protection: Abstinence, Pill  Other Topics Concern   Not on file  Social History Narrative   Lives at home alone.   Right-handed.   Occasional caffeine use.   Social Determinants of Health   Financial Resource Strain: Low Risk  (05/08/2022)   Overall Financial Resource Strain (CARDIA)    Difficulty of Paying Living Expenses: Not hard at all  Food Insecurity: No Food Insecurity (05/08/2022)   Hunger Vital Sign    Worried About Running Out of Food in the Last Year: Never true    Ran Out of Food in the Last Year: Never true  Transportation Needs: No Transportation Needs (05/08/2022)   PRAPARE - Hydrologist (Medical): No    Lack of Transportation (Non-Medical): No  Physical Activity: Inactive (05/08/2022)   Exercise Vital Sign    Days of Exercise per Week: 0 days    Minutes of Exercise per Session: 0 min  Stress: No Stress Concern Present (05/08/2022)   [place]    Feeling of Stress : Not at all  Social Connections: Moderately Integrated (05/08/2022)   Social Connection and Isolation Panel [NHANES]    Frequency of Communication with Friends and Family: More than three times a week    Frequency of Social Gatherings with Friends and Family: More than three times a week    Attends Religious Services: More than 4 times per year    Active Member of [employer] or Organizations: Yes    Attends Archivist Meetings: More than 4 times per year  Marital Status: Divorced     Family History: The patient's family history includes Breast cancer in her mother; Diabetes  in her father, mother, and another family member; Hemophilia in an other family member; Hypertension in her father, mother, and another family member; Kidney failure in her mother; Stroke in an other family member.  ROS:   Please see the history of present illness.     All other systems reviewed and are negative.  EKGs/Labs/Other Studies Reviewed:    The following studies were reviewed today:   EKG:  EKG not  ordered today.  Recent Labs: 08/28/2021: TSH 0.85 03/24/2022: Hemoglobin 13.2; Platelets 297 05/11/2022: ALT 14; BUN 25; Creatinine, Ser 0.58; Potassium 3.6; Sodium 140  Recent Lipid Panel    Component Value Date/Time   CHOL 242 (H) 05/11/2022 0718   CHOL 168 07/31/2021 0905   TRIG 171.0 (H) 05/11/2022 0718   HDL 65.00 05/11/2022 0718   HDL 67 07/31/2021 0905   CHOLHDL 4 05/11/2022 0718   VLDL 34.2 05/11/2022 0718   LDLCALC 143 (H) 05/11/2022 0718   LDLCALC 85 07/31/2021 0905    Physical Exam:    VS:  BP (!) 144/88 (BP Location: Left Arm, Patient Position: Sitting, Cuff Size: Normal) Comment (Cuff Size): upper forarm  Pulse 74   Ht '5\' 6"'$  (1.676 m)   Wt 286 lb 12.8 oz (130.1 kg)   SpO2 99%   BMI 46.29 kg/m     Wt Readings from Last 3 Encounters:  05/17/22 286 lb 12.8 oz (130.1 kg)  05/08/22 282 lb (127.9 kg)  04/26/22 274 lb (124.3 kg)     GEN:  Well nourished, well developed in no acute distress, obese HEENT: Normal NECK: No JVD; No carotid bruits CARDIAC: RRR, no murmurs, rubs, gallops RESPIRATORY:  Clear to auscultation without rales, wheezing or rhonchi  ABDOMEN: Soft, non-tender, non-distended MUSCULOSKELETAL: Lower extremity edema/ lymphedema noted SKIN: Warm and dry NEUROLOGIC:  Alert and oriented x 3 PSYCHIATRIC:  Normal affect   ASSESSMENT:    1. Paroxysmal atrial fibrillation (HCC)   2. Primary hypertension   3. Paroxysmal SVT (supraventricular tachycardia)   4. Pure hypercholesterolemia   5. Morbid obesity ([place])   6. Bilateral leg edema     PLAN:    In order of problems listed above:   Paroxysmal atrial fibrillation in the context of steroid administration, 2-week cardiac monitor placed, no evidence of A-fib noted.  Will not recommend long-term anticoagulation at this point as this is likely a lone event in the context of steroid administration, temporal artery biopsy.  If another incident of A-fib is noted while patient is off prednisone, will recommend anticoagulation. hypertension.  BP elevated.  Start HCTZ 25 mg daily, stop Lasix.  Continue Cardizem SR 120 mg twice daily. paroxysmal SVT, symptoms controlled, continue Cardizem SR 120 mg twice daily. Hyperlipidemia, recent cholesterol elevated, patient wants to wait and try eating healthier which I think is reasonable.  Repeat lipid panel in 2 months.  If cholesterol elevated at follow-up visit, plan to titrate Crestor.  Continue Crestor 10 mg daily for now.   Morbid obesity, patient tearful as she has tried to lose weight with diet, unsuccessfully.  Start [employer].  Considers Zepbound if [place] not available. Bilateral leg edema/lymphedema, has appointment with lymphedema clinic next month.  Follow-up in 2 months   Total encounter time more than 40 minutes  Greater than 50% was spent in counseling and coordination of care with the patient   This note was generated  in part or whole with voice recognition software. Voice recognition is usually quite accurate but there are transcription errors that can and very often do occur. I apologize for any typographical errors that were not detected and corrected.  Medication Adjustments/Labs and Tests Ordered: Current medicines are reviewed at length with the patient today.  Concerns regarding medicines are outlined above.  Orders Placed This Encounter  Procedures   Lipid panel    Meds ordered this encounter  Medications   CARDIZEM 30 MG tablet    Sig: Take 1 tablet (30 mg total) by mouth daily as needed (for elevated Heart rate).     Dispense:  30 tablet    Refill:  3   nitroGLYCERIN (NITROSTAT) 0.4 MG SL tablet    Sig: Place 1 tablet (0.4 mg total) under the tongue every 5 (five) minutes as needed for chest pain.    Dispense:  25 tablet    Refill:  3   rosuvastatin (CRESTOR) 10 MG tablet    Sig: Take 1 tablet (10 mg total) by mouth daily.    Dispense:  90 tablet    Refill:  3   hydrochlorothiazide (HYDRODIURIL) 25 MG tablet    Sig: Take 1 tablet (25 mg total) by mouth daily.    Dispense:  90 tablet    Refill:  3   Semaglutide-Weight Management 0.25 MG/0.5ML SOAJ    Sig: Inject 0.25 mg into the skin once a week for 28 days.    Dispense:  2 mL    Refill:  0   Semaglutide-Weight Management 0.5 MG/0.5ML SOAJ    Sig: Inject 0.5 mg into the skin once a week for 28 days.    Dispense:  2 mL    Refill:  0   Semaglutide-Weight Management 1 MG/0.5ML SOAJ    Sig: Inject 1 mg into the skin once a week for 28 days.    Dispense:  2 mL    Refill:  0   Semaglutide-Weight Management 1.7 MG/0.75ML SOAJ    Sig: Inject 1.7 mg into the skin once a week for 28 days.    Dispense:  3 mL    Refill:  0   Semaglutide-Weight Management 2.4 MG/0.75ML SOAJ    Sig: Inject 2.4 mg into the skin once a week for 28 days.    Dispense:  3 mL    Refill:  0     Patient Instructions  Medication Instructions:   STOP LASIX START HCTZ - Take one tablet ('25mg'$ ) by mouth daily.   3. Start taking Wegovy  Month 1: 0.25 mg once a week. Month 2: 0.5 mg once a week. (Call or send [place] a MyChart message when you are on week 2, so we can send your next dose in for you). Month 3: 1 mg once a week. Month 4: 1.7 mg once a week. Month 5 and beyond: 2.4 mg once a week (maintenance dose)   *If you need a refill on your cardiac medications before your next appointment, please call your pharmacy*   Lab Work:  Your physician recommends that you return for lab work in: 2 months at the medical mall. You will need to be fasting.  No appt is needed.  Hours are M-F 7AM- 6 PM.  If you have labs (blood work) drawn today and your tests are completely normal, you will receive your results only by: [place] (if you have MyChart) OR A paper copy in the mail If you have any lab test  that is abnormal or we need to change your treatment, we will call you to review the results.   Testing/Procedures:  None Ordered   Follow-Up: At [facility], you and your health needs are our priority.  As part of our continuing mission to provide you with exceptional heart care, we have created designated Provider Care Teams.  These Care Teams include your primary Cardiologist (physician) and Advanced Practice Providers (APPs -  Physician Assistants and Nurse Practitioners) who all work together to provide you with the care you need, when you need it.  We recommend signing up for the patient portal called "MyChart".  Sign up information is provided on this After Visit Summary.  MyChart is used to connect with patients for Virtual Visits (Telemedicine).  Patients are able to view lab/test results, encounter notes, upcoming appointments, etc.  Non-urgent messages can be sent to your provider as well.   To learn more about what you can do with MyChart, go to [email or web address].    Your next appointment:   2 month(s)  The format for your next appointment:   In Person  Provider:   You may see [name], MD or one of the following Advanced Practice Providers on your designated Care Team:   Murray Hodgkins, NP Christell Faith, PA-C Cadence Kathlen Mody, PA-C Gerrie Nordmann, NP      Signed, [name], MD  05/17/2022 12:40 PM    [place]

## 2022-05-17 NOTE — Patient Instructions (Signed)
Medication Instructions:   STOP LASIX START HCTZ - Take one tablet ('25mg'$ ) by mouth daily.   3. Start taking Wegovy  Month 1: 0.25 mg once a week. Month 2: 0.5 mg once a week. (Call or send [place] a MyChart message when you are on week 2, so we can send your next dose in for you). Month 3: 1 mg once a week. Month 4: 1.7 mg once a week. Month 5 and beyond: 2.4 mg once a week (maintenance dose)   *If you need a refill on your cardiac medications before your next appointment, please call your pharmacy*   Lab Work:  Your physician recommends that you return for lab work in: 2 months at the medical mall. You will need to be fasting.  No appt is needed. Hours are M-F 7AM- 6 PM.  If you have labs (blood work) drawn today and your tests are completely normal, you will receive your results only by: [place] (if you have MyChart) OR A paper copy in the mail If you have any lab test that is abnormal or we need to change your treatment, we will call you to review the results.   Testing/Procedures:  None Ordered   Follow-Up: At [facility], you and your health needs are our priority.  As part of our continuing mission to provide you with exceptional heart care, we have created designated Provider Care Teams.  These Care Teams include your primary Cardiologist (physician) and Advanced Practice Providers (APPs -  Physician Assistants and Nurse Practitioners) who all work together to provide you with the care you need, when you need it.  We recommend signing up for the patient portal called "MyChart".  Sign up information is provided on this After Visit Summary.  MyChart is used to connect with patients for Virtual Visits (Telemedicine).  Patients are able to view lab/test results, encounter notes, upcoming appointments, etc.  Non-urgent messages can be sent to your provider as well.   To learn more about what you can do with MyChart, go to [email or web address].    Your next  appointment:   2 month(s)  The format for your next appointment:   In Person  Provider:   You may see [name], MD or one of the following Advanced Practice Providers on your designated Care Team:   Murray Hodgkins, NP Christell Faith, PA-C Cadence Kathlen Mody, PA-C Gerrie Nordmann, NP

## 2022-05-18 ENCOUNTER — Encounter: Payer: Medicare Other | Admitting: Family Medicine

## 2022-05-18 ENCOUNTER — Encounter: Payer: Self-pay | Admitting: Family Medicine

## 2022-05-18 ENCOUNTER — Telehealth: Payer: Self-pay

## 2022-05-18 ENCOUNTER — Other Ambulatory Visit: Payer: Self-pay

## 2022-05-18 ENCOUNTER — Ambulatory Visit (INDEPENDENT_AMBULATORY_CARE_PROVIDER_SITE_OTHER): Payer: Medicare Other | Admitting: Family Medicine

## 2022-05-18 VITALS — BP 100/70 | HR 77 | Temp 98.1°F | Ht 65.5 in | Wt 286.0 lb

## 2022-05-18 DIAGNOSIS — I480 Paroxysmal atrial fibrillation: Secondary | ICD-10-CM

## 2022-05-18 DIAGNOSIS — Z0000 Encounter for general adult medical examination without abnormal findings: Secondary | ICD-10-CM | POA: Diagnosis not present

## 2022-05-18 DIAGNOSIS — M810 Age-related osteoporosis without current pathological fracture: Secondary | ICD-10-CM

## 2022-05-18 DIAGNOSIS — E236 Other disorders of pituitary gland: Secondary | ICD-10-CM | POA: Diagnosis not present

## 2022-05-18 DIAGNOSIS — I10 Essential (primary) hypertension: Secondary | ICD-10-CM

## 2022-05-18 DIAGNOSIS — E7800 Pure hypercholesterolemia, unspecified: Secondary | ICD-10-CM

## 2022-05-18 MED ORDER — DILTIAZEM HCL ER 120 MG PO CP12: 120.0000 mg | ORAL_CAPSULE | Freq: Two times a day (BID) | ORAL | 2 refills | Status: DC

## 2022-05-18 NOTE — Patient Instructions (Signed)
Please call the location of your choice from the menu below to schedule your Mammogram and/or Bone Density appointment.    Regino Ramirez   [facility]                      Phone:  [phone] [address]                               [place], [place] [address]                                                             Services: Traditional and 3D Mammogram, Bone Density   [facility]                 Phone: [phone] [address]                                                       [place], [place] [address]    Service: Bone Density ONLY   *this site does NOT perform mammograms  [facility]                        Phone:  [phone] [address]                                  [place], [place] [address]                                            Services:  3D Mammogram and Bone Density    

## 2022-05-18 NOTE — Telephone Encounter (Signed)
Prior Authorization initiated in [email or web address]. KEY: [id number] Response: Your information has been submitted to [place]. To check for an updated outcome later, reopen this PA request from your dashboard.  If [employer] has not responded to your request within 24 hours, contact [place] at [phone].

## 2022-05-18 NOTE — Progress Notes (Signed)
Patient ID: Kimberly Patton, female    DOB: [birth year], 66 y.o.   MRN: [id number]  This visit was conducted in person.  BP 100/70   Pulse 77   Temp 98.1 F (36.7 C) (Oral)   Ht 5' 5.5" (1.664 m)   Wt 286 lb (129.7 kg)   SpO2 97%   BMI 46.87 kg/m    CC:  Chief Complaint  Patient presents with   Annual Exam    Part 2    Subjective:   HPI: Kimberly Patton is a 66 y.o. female presenting on 05/18/2022 for Annual Exam (Part 2)  The patient presents for annual medicare wellness, complete physical and review of chronic health problems. He/She also has the following acute concerns today: no acute issue  The patient saw a LPN or RN for medicare wellness visit.  Prevention and wellness was reviewed in detail. Note reviewed and important notes copied below.   Reviewed labs in detail with patient.   Has started actonel EC for  osteoporosis... has felt some nausea and bloating short-lived in AM.  No GERD. Wt Readings from Last 3 Encounters:  05/18/22 286 lb (129.7 kg)  05/17/22 286 lb 12.8 oz (130.1 kg)  05/08/22 282 lb (127.9 kg)    Paroxysmal atrial fibrillation in the context of steroid administration, 2-week cardiac monitor placed, no evidence of A-fib noted.  Will not recommend long-term anticoagulation at this point as this is likely a lone event in the context of steroid administration, temporal artery biopsy.    Cardiology has recommend starting wegovy. Wt Readings from Last 3 Encounters:  05/18/22 286 lb (129.7 kg)  05/17/22 286 lb 12.8 oz (130.1 kg)  05/08/22 282 lb (127.9 kg)     Hypertension:  Stable control on diltiazem S SR 120 mg BID,  BP Readings from Last 3 Encounters:  05/18/22 100/70  05/17/22 (!) 144/88  04/30/22 (!) 123/102  Using medication without problems or lightheadedness:  none Chest pain with exertion:none Edema:none Short of breath: none Average home BPs: Other issues:   Elevated Cholesterol:  LDL not at goal < 100 ...  Wants to try   diet change before restarting statin. Recent Labs   Lab Results  Component Value Date   CHOL 242 (H) 05/11/2022   HDL 65.00 05/11/2022   LDLCALC 143 (H) 05/11/2022   TRIG 171.0 (H) 05/11/2022   CHOLHDL 4 05/11/2022  Using medications without problems: Muscle aches:  Diet compliance:  working on heart healthy diet Exercise:  limited in rehab Other complaints:   Wt Readings from Last 3 Encounters:  05/18/22 286 lb (129.7 kg)  05/17/22 286 lb 12.8 oz (130.1 kg)  05/08/22 282 lb (127.9 kg)     Mild intermittent asthma Followed by Pulmonary Dr. Duwayne Heck.         Relevant past medical, surgical, family and social history reviewed and updated as indicated. Interim medical history since our last visit reviewed. Allergies and medications reviewed and updated. Outpatient Medications Prior to Visit  Medication Sig Dispense Refill   albuterol (VENTOLIN HFA) 108 (90 Base) MCG/ACT inhaler Inhale 2 puffs into the lungs every 6 (six) hours as needed for wheezing or shortness of breath. 8 g 2   budesonide-formoterol (SYMBICORT) 160-4.5 MCG/ACT inhaler Inhale 2 puffs into the lungs 2 (two) times daily. 1 each 5   Calcium Citrate-Vitamin D (CITRACAL + D PO) Take 2 tablets by mouth daily.      CARDIZEM 30 MG tablet Take 1 tablet (30  mg total) by mouth daily as needed (for elevated Heart rate). 30 tablet 3   Cholecalciferol (VITAMIN D3) 1.25 MG (50000 UT) CAPS Take 1 capsule by mouth once a week. 12 capsule 3   Coenzyme Q10-Vitamin E (QUNOL ULTRA COQ10) 100-150 MG-UNIT CAPS Take 100 mg by mouth daily.     Cyanocobalamin (VITAMIN B-12) 5000 MCG SUBL Place 5,000 mcg under the tongue daily.     denosumab (PROLIA) 60 MG/ML SOSY injection      diltiazem (CARDIZEM SR) 120 MG 12 hr capsule Take 1 capsule (120 mg total) by mouth 2 (two) times daily. 60 capsule 2   ELDERBERRY PO Take 1,000 mg by mouth daily.      famotidine (PEPCID) 40 MG tablet Take 40 mg by mouth at bedtime.     fluticasone (FLONASE)  50 MCG/ACT nasal spray SPRAY 2 SPRAYS INTO EACH NOSTRIL EVERY DAY 48 mL 1   gabapentin (NEURONTIN) 300 MG capsule Take 300 mg by mouth daily.     hydrochlorothiazide (HYDRODIURIL) 25 MG tablet Take 1 tablet (25 mg total) by mouth daily. 90 tablet 3   Lactobacillus Rhamnosus, GG, (CULTURELLE IMMUNITY SUPPORT PO) Take 1 capsule by mouth daily.     Menaquinone-7 (VITAMIN K2 PO) Take 1 tablet by mouth daily.     montelukast (SINGULAIR) 10 MG tablet TAKE 1 TABLET BY MOUTH EVERYDAY AT BEDTIME 90 tablet 1   Multiple Vitamins-Minerals (CENTRUM SILVER 50+WOMEN PO) Take 1 tablet by mouth daily.     nitroGLYCERIN (NITROSTAT) 0.4 MG SL tablet Place 1 tablet (0.4 mg total) under the tongue every 5 (five) minutes as needed for chest pain. 25 tablet 3   Omega-3 Fatty Acids (FISH OIL) 1200 MG CAPS Take 1 capsule by mouth daily.     pantoprazole (PROTONIX) 40 MG tablet TAKE 1 TABLET BY MOUTH EVERY DAY 90 tablet 1   Riboflavin 100 MG CAPS Take 1 capsule by mouth daily.     risedronate (ACTONEL) 35 MG tablet Take 1 tablet (35 mg total) by mouth every 7 (seven) days. with water on empty stomach, nothing by mouth or lie down for next 30 minutes.Make sure this is actonel EC/coated 4 tablet 11   rosuvastatin (CRESTOR) 10 MG tablet Take 1 tablet (10 mg total) by mouth daily. 90 tablet 3   TURMERIC PO Take 1 tablet by mouth daily.     Semaglutide-Weight Management 0.25 MG/0.5ML SOAJ Inject 0.25 mg into the skin once a week for 28 days. (Patient not taking: Reported on 05/18/2022) 2 mL 0   [START ON 06/15/2022] Semaglutide-Weight Management 0.5 MG/0.5ML SOAJ Inject 0.5 mg into the skin once a week for 28 days. (Patient not taking: Reported on 05/18/2022) 2 mL 0   [START ON 07/14/2022] Semaglutide-Weight Management 1 MG/0.5ML SOAJ Inject 1 mg into the skin once a week for 28 days. (Patient not taking: Reported on 05/18/2022) 2 mL 0   [START ON 08/12/2022] Semaglutide-Weight Management 1.7 MG/0.75ML SOAJ Inject 1.7 mg into the skin  once a week for 28 days. (Patient not taking: Reported on 05/18/2022) 3 mL 0   [START ON 09/10/2022] Semaglutide-Weight Management 2.4 MG/0.75ML SOAJ Inject 2.4 mg into the skin once a week for 28 days. (Patient not taking: Reported on 05/18/2022) 3 mL 0   furosemide (LASIX) 20 MG tablet Take 1 tablet (20 mg total) by mouth daily. 90 tablet 0   Facility-Administered Medications Prior to Visit  Medication Dose Route Frequency Provider Last Rate Last Admin   albuterol (PROVENTIL) (2.5  MG/3ML) 0.083% nebulizer solution 2.5 mg  2.5 mg Nebulization Once Tyler Pita, MD         Per HPI unless specifically indicated in ROS section below Review of Systems  Constitutional:  Negative for fatigue and fever.  HENT:  Negative for congestion.   Eyes:  Negative for pain.  Respiratory:  Negative for cough and shortness of breath.   Cardiovascular:  Negative for chest pain, palpitations and leg swelling.  Gastrointestinal:  Negative for abdominal pain.  Genitourinary:  Negative for dysuria and vaginal bleeding.  Musculoskeletal:  Negative for back pain.  Neurological:  Negative for syncope, light-headedness and headaches.  Psychiatric/Behavioral:  Negative for dysphoric mood.    Objective:  BP 100/70   Pulse 77   Temp 98.1 F (36.7 C) (Oral)   Ht 5' 5.5" (1.664 m)   Wt 286 lb (129.7 kg)   SpO2 97%   BMI 46.87 kg/m   Wt Readings from Last 3 Encounters:  05/18/22 286 lb (129.7 kg)  05/17/22 286 lb 12.8 oz (130.1 kg)  05/08/22 282 lb (127.9 kg)      Physical Exam Vitals and nursing note reviewed.  Constitutional:      General: She is not in acute distress.    Appearance: Normal appearance. She is well-developed. She is obese. She is not ill-appearing or toxic-appearing.  HENT:     Head: Normocephalic.     Right Ear: Hearing, tympanic membrane, ear canal and external ear normal.     Left Ear: Hearing, tympanic membrane, ear canal and external ear normal.     Nose: Nose normal.  Eyes:      General: Lids are normal. Lids are everted, no foreign bodies appreciated.     Conjunctiva/sclera: Conjunctivae normal.     Pupils: Pupils are equal, round, and reactive to light.  Neck:     Thyroid: No thyroid mass or thyromegaly.     Vascular: No carotid bruit.     Trachea: Trachea normal.  Cardiovascular:     Rate and Rhythm: Normal rate and regular rhythm.     Heart sounds: Normal heart sounds, S1 normal and S2 normal. No murmur heard.    No gallop.  Pulmonary:     Effort: Pulmonary effort is normal. No respiratory distress.     Breath sounds: Normal breath sounds. No wheezing, rhonchi or rales.  Abdominal:     General: Bowel sounds are normal. There is no distension or abdominal bruit.     Palpations: Abdomen is soft. There is no fluid wave or mass.     Tenderness: There is no abdominal tenderness. There is no guarding or rebound.     Hernia: No hernia is present.  Musculoskeletal:     Cervical back: Normal range of motion and neck supple.  Lymphadenopathy:     Cervical: No cervical adenopathy.  Skin:    General: Skin is warm and dry.     Findings: No rash.  Neurological:     Mental Status: She is alert.     Cranial Nerves: No cranial nerve deficit.     Sensory: No sensory deficit.  Psychiatric:        Mood and Affect: Mood is not anxious or depressed.        Speech: Speech normal.        Behavior: Behavior normal. Behavior is cooperative.        Judgment: Judgment normal.          COVID 19 screen:  No  recent travel or known exposure to [place] The patient denies respiratory symptoms of COVID 19 at this time. The importance of social distancing was discussed today.   Assessment and Plan   The patient's preventative maintenance and recommended screening tests for an annual wellness exam were reviewed in full today. Brought up to date unless services declined.  Counselled on the importance of diet, exercise, and its role in overall health and mortality. The  patient's FH and SH was reviewed, including their home life, tobacco status, and drug and alcohol status.   Vaccines:Uptodate high dose flu , Td uptodate. refused shingles vaccine. S/P  COVID vaccine x 2 Consider RSV. Pap/DVE:  2016  neg pap, neg HPV.. , no family history of endometrial/ovarian cancer..  no further indicated Mammo: 04/10/2021.. repeat due, s/p lumpectomy Bone Density:  osteoporosis  SE to prolia.. now on actonel EC Colon: 04/2021 repeat in 3 years Smoking Status:nonsmoker ETOH/ drug use:  Rare wine/no drugs  Hep C: done  HIV screen:  done  Problem List Items Addressed This Visit     Age-related osteoporosis without current pathological fracture - Primary     SE actonel.      Relevant Orders   C-Terminal Telopeptide   Empty sella syndrome ([place])    Noted incidentally on MRI brain.  Normal vitamin D, PTH and calcium levels.      Hypercholesteremia    Chronic, not at goal... off statin. Patient wishes to restart working on lifestyle change prior to restarting statin medication.  I discussed with her the importance of improved cholesterol control to decrease the risk of heart disease and stroke.        Morbid obesity ([place])    Encouraged exercise, weight loss, healthy eating habits. Discussed possible initiation of GLP-1 medication such as Wegovy.      Paroxysmal atrial fibrillation ([facility])    In context of steroid administration no recurrent A-fib noted on cardiac monitor. No clear indication per cardiology for long-term anticoagulation as it is likely a lone event.  Patient no longer on steroids.       Primary hypertension    Stable control on diltiazem S SR 120 mg BID,        [name], MD

## 2022-05-18 NOTE — Telephone Encounter (Signed)
Called patient. Made her aware of Dr. Eddie North recommendations.

## 2022-05-18 NOTE — Telephone Encounter (Signed)
Received fax from [place] stating that the request for prior authorization for Kimberly Patton has been approved from 05/18/22-12/17/2022.

## 2022-05-21 ENCOUNTER — Other Ambulatory Visit: Payer: Self-pay | Admitting: Family Medicine

## 2022-05-21 DIAGNOSIS — Z1231 Encounter for screening mammogram for malignant neoplasm of breast: Secondary | ICD-10-CM

## 2022-05-22 ENCOUNTER — Ambulatory Visit (INDEPENDENT_AMBULATORY_CARE_PROVIDER_SITE_OTHER): Payer: Medicare Other | Admitting: Nurse Practitioner

## 2022-05-22 ENCOUNTER — Encounter: Payer: Self-pay | Admitting: Family Medicine

## 2022-05-22 ENCOUNTER — Encounter (INDEPENDENT_AMBULATORY_CARE_PROVIDER_SITE_OTHER): Payer: Self-pay | Admitting: Nurse Practitioner

## 2022-05-22 VITALS — BP 127/78 | HR 73 | Resp 17 | Ht 66.0 in | Wt 286.0 lb

## 2022-05-22 DIAGNOSIS — I83893 Varicose veins of bilateral lower extremities with other complications: Secondary | ICD-10-CM | POA: Diagnosis not present

## 2022-05-22 NOTE — Progress Notes (Signed)
Varicose veins of bilateral  lower extremity with inflammation (454.1  I83.10) Current Plans   Indication: Patient presents with symptomatic varicose veins of the bilateral  lower extremity.   Procedure: Sclerotherapy using hypertonic saline mixed with 1% Lidocaine was performed on the bilateral lower extremity. Compression wraps were placed. The patient tolerated the procedure well. 

## 2022-05-23 ENCOUNTER — Encounter: Payer: Self-pay | Admitting: Family Medicine

## 2022-05-23 ENCOUNTER — Ambulatory Visit (INDEPENDENT_AMBULATORY_CARE_PROVIDER_SITE_OTHER): Payer: Medicare Other | Admitting: Family Medicine

## 2022-05-23 VITALS — BP 128/80 | HR 79 | Temp 97.2°F | Ht 65.5 in | Wt 286.0 lb

## 2022-05-23 DIAGNOSIS — J029 Acute pharyngitis, unspecified: Secondary | ICD-10-CM | POA: Diagnosis not present

## 2022-05-23 DIAGNOSIS — M810 Age-related osteoporosis without current pathological fracture: Secondary | ICD-10-CM | POA: Diagnosis not present

## 2022-05-23 LAB — POC INFLUENZA A&B (BINAX/QUICKVUE)
Influenza A, POC: NEGATIVE
Influenza B, POC: NEGATIVE

## 2022-05-23 MED ORDER — IBANDRONATE SODIUM 3 MG/3ML IV SOLN: 3.0000 mg | Freq: Once | INTRAVENOUS | 0 refills | Status: DC

## 2022-05-23 NOTE — Progress Notes (Signed)
Patient ID: ROSELIA SNIPE, female    DOB: [birth year], 66 y.o.   MRN: [id number]  This visit was conducted in person.  BP 128/80 (BP Location: Left Arm, Patient Position: Sitting)   Pulse 79   Temp (!) 97.2 F (36.2 C) (Skin)   Ht 5' 5.5" (1.664 m)   Wt 286 lb (129.7 kg)   SpO2 99%   BMI 46.87 kg/m    CC:  Chief Complaint  Patient presents with   Cough    Throat burns, may be from new medication    Subjective:   HPI: Kimberly Patton is a 66 y.o. female presenting on 05/23/2022 for Cough (Throat burns, may be from new medication)   Recent start on Actonel EC for osteoporosis.Marland Kitchen. ( failed Prolia given SE) in history of GERD. Last dose 2 days ago Now with sore throat, burning. Likely GERD given worsening heartburn  but mild despite pantoprazole 40 mg daily and pepcid AC BID.    Yesterday symptoms worsened, last night started wheezing and cough. Burning improved but now hoarse voice. She tried her inhaler.. helped some.  She feels tired but had to sit up at night.  Some post nasal drip.   May have helped some.   Did feel chills.   3 weeks ago had COVID. 04/30/2022          Relevant past medical, surgical, family and social history reviewed and updated as indicated. Interim medical history since our last visit reviewed. Allergies and medications reviewed and updated. Outpatient Medications Prior to Visit  Medication Sig Dispense Refill   albuterol (VENTOLIN HFA) 108 (90 Base) MCG/ACT inhaler Inhale 2 puffs into the lungs every 6 (six) hours as needed for wheezing or shortness of breath. 8 g 2   budesonide-formoterol (SYMBICORT) 160-4.5 MCG/ACT inhaler Inhale 2 puffs into the lungs 2 (two) times daily. 1 each 5   Calcium Citrate-Vitamin D (CITRACAL + D PO) Take 2 tablets by mouth daily.      CARDIZEM 30 MG tablet Take 1 tablet (30 mg total) by mouth daily as needed (for elevated Heart rate). 30 tablet 3   Cholecalciferol (VITAMIN D3) 1.25 MG (50000 UT) CAPS Take  1 capsule by mouth once a week. 12 capsule 3   Coenzyme Q10-Vitamin E (QUNOL ULTRA COQ10) 100-150 MG-UNIT CAPS Take 100 mg by mouth daily.     Cyanocobalamin (VITAMIN B-12) 5000 MCG SUBL Place 5,000 mcg under the tongue daily.     denosumab (PROLIA) 60 MG/ML SOSY injection      diltiazem (CARDIZEM SR) 120 MG 12 hr capsule Take 1 capsule (120 mg total) by mouth 2 (two) times daily. 60 capsule 2   ELDERBERRY PO Take 1,000 mg by mouth daily.      famotidine (PEPCID) 40 MG tablet Take 40 mg by mouth at bedtime.     fluticasone (FLONASE) 50 MCG/ACT nasal spray SPRAY 2 SPRAYS INTO EACH NOSTRIL EVERY DAY 48 mL 1   gabapentin (NEURONTIN) 300 MG capsule Take 300 mg by mouth daily.     hydrochlorothiazide (HYDRODIURIL) 25 MG tablet Take 1 tablet (25 mg total) by mouth daily. 90 tablet 3   Lactobacillus Rhamnosus, GG, (CULTURELLE IMMUNITY SUPPORT PO) Take 1 capsule by mouth daily.     Menaquinone-7 (VITAMIN K2 PO) Take 1 tablet by mouth daily.     montelukast (SINGULAIR) 10 MG tablet TAKE 1 TABLET BY MOUTH EVERYDAY AT BEDTIME 90 tablet 1   Multiple Vitamins-Minerals (CENTRUM SILVER 50+WOMEN PO) Take  1 tablet by mouth daily.     nitroGLYCERIN (NITROSTAT) 0.4 MG SL tablet Place 1 tablet (0.4 mg total) under the tongue every 5 (five) minutes as needed for chest pain. 25 tablet 3   Omega-3 Fatty Acids (FISH OIL) 1200 MG CAPS Take 1 capsule by mouth daily.     pantoprazole (PROTONIX) 40 MG tablet TAKE 1 TABLET BY MOUTH EVERY DAY 90 tablet 1   Riboflavin 100 MG CAPS Take 1 capsule by mouth daily.     risedronate (ACTONEL) 35 MG tablet Take 1 tablet (35 mg total) by mouth every 7 (seven) days. with water on empty stomach, nothing by mouth or lie down for next 30 minutes.Make sure this is actonel EC/coated 4 tablet 11   rosuvastatin (CRESTOR) 10 MG tablet Take 1 tablet (10 mg total) by mouth daily. 90 tablet 3   Semaglutide-Weight Management 0.25 MG/0.5ML SOAJ Inject 0.25 mg into the skin once a week for 28 days. 2  mL 0   [START ON 06/15/2022] Semaglutide-Weight Management 0.5 MG/0.5ML SOAJ Inject 0.5 mg into the skin once a week for 28 days. 2 mL 0   [START ON 07/14/2022] Semaglutide-Weight Management 1 MG/0.5ML SOAJ Inject 1 mg into the skin once a week for 28 days. 2 mL 0   [START ON 08/12/2022] Semaglutide-Weight Management 1.7 MG/0.75ML SOAJ Inject 1.7 mg into the skin once a week for 28 days. 3 mL 0   [START ON 09/10/2022] Semaglutide-Weight Management 2.4 MG/0.75ML SOAJ Inject 2.4 mg into the skin once a week for 28 days. 3 mL 0   TURMERIC PO Take 1 tablet by mouth daily.     Facility-Administered Medications Prior to Visit  Medication Dose Route Frequency Provider Last Rate Last Admin   albuterol (PROVENTIL) (2.5 MG/3ML) 0.083% nebulizer solution 2.5 mg  2.5 mg Nebulization Once Tyler Pita, MD         Per HPI unless specifically indicated in ROS section below Review of Systems  Constitutional:  Negative for fatigue and fever.  HENT:  Positive for sore throat. Negative for congestion.   Eyes:  Negative for pain.  Respiratory:  Negative for cough and shortness of breath.   Cardiovascular:  Negative for chest pain, palpitations and leg swelling.  Gastrointestinal:  Negative for abdominal pain.  Genitourinary:  Negative for dysuria and vaginal bleeding.  Musculoskeletal:  Negative for back pain.  Neurological:  Negative for syncope, light-headedness and headaches.  Psychiatric/Behavioral:  Negative for dysphoric mood.    Objective:  BP 128/80 (BP Location: Left Arm, Patient Position: Sitting)   Pulse 79   Temp (!) 97.2 F (36.2 C) (Skin)   Ht 5' 5.5" (1.664 m)   Wt 286 lb (129.7 kg)   SpO2 99%   BMI 46.87 kg/m   Wt Readings from Last 3 Encounters:  05/23/22 286 lb (129.7 kg)  05/22/22 286 lb (129.7 kg)  05/18/22 286 lb (129.7 kg)      Physical Exam Constitutional:      General: She is not in acute distress.    Appearance: Normal appearance. She is well-developed. She is not  ill-appearing or toxic-appearing.  HENT:     Head: Normocephalic.     Right Ear: Hearing, tympanic membrane, ear canal and external ear normal. Tympanic membrane is not erythematous, retracted or bulging.     Left Ear: Hearing, tympanic membrane, ear canal and external ear normal. Tympanic membrane is not erythematous, retracted or bulging.     Nose: No mucosal edema or rhinorrhea.  Right Sinus: No maxillary sinus tenderness or frontal sinus tenderness.     Left Sinus: No maxillary sinus tenderness or frontal sinus tenderness.     Mouth/Throat:     Pharynx: Uvula midline.  Eyes:     General: Lids are normal. Lids are everted, no foreign bodies appreciated.     Conjunctiva/sclera: Conjunctivae normal.     Pupils: Pupils are equal, round, and reactive to light.  Neck:     Thyroid: No thyroid mass or thyromegaly.     Vascular: No carotid bruit.     Trachea: Trachea normal.  Cardiovascular:     Rate and Rhythm: Normal rate and regular rhythm.     Pulses: Normal pulses.     Heart sounds: Normal heart sounds, S1 normal and S2 normal. No murmur heard.    No friction rub. No gallop.  Pulmonary:     Effort: Pulmonary effort is normal. No tachypnea or respiratory distress.     Breath sounds: Normal breath sounds. No decreased breath sounds, wheezing, rhonchi or rales.  Abdominal:     General: Bowel sounds are normal.     Palpations: Abdomen is soft.     Tenderness: There is no abdominal tenderness.  Musculoskeletal:     Cervical back: Normal range of motion and neck supple.  Skin:    General: Skin is warm and dry.     Findings: No rash.  Neurological:     Mental Status: She is alert.  Psychiatric:        Mood and Affect: Mood is not anxious or depressed.        Speech: Speech normal.        Behavior: Behavior normal. Behavior is cooperative.        Thought Content: Thought content normal.        Judgment: Judgment normal.          COVID 19 screen:  No recent travel or known  exposure to COVID19 The patient denies respiratory symptoms of COVID 19 at this time. The importance of social distancing was discussed today.   Assessment and Plan    Problem List Items Addressed This Visit     Age-related osteoporosis without current pathological fracture    Intolerant of Prolia in past. Now intolerant of Actonel EC.  She will stop this medication given side effects. We discussed referral to endocrinology but it appears she has seen Dr. Cruzita Lederer in the past.  I will ask her to speak with Dr. Cruzita Lederer about whether IV Boniva versus Forteo is recommended      Sore throat - Primary    Acute in setting of recent start of Actonel in patient with history of reflux.  Most likely her symptoms are due to worsening GERD.  Minimal acute symptoms but given she did notice some chills in waiting room we can check a flu test.  She did have COVID less than 3 weeks ago, so recurrence is unlikely  Flu test returned negative  I have recommended that she stop Actonel given intolerable symptoms.  She will increase to pantoprazole 40 mg to twice daily until she is feeling better.  She will increase Pepcid AC to twice daily until she is feeling better as well.       Relevant Orders   POC Influenza A&B(BINAX/QUICKVUE)     [name], MD

## 2022-05-23 NOTE — Patient Instructions (Addendum)
Increase pantoprazole  twice daily until reflux improving.  Take PEPCID AC  twice daily  I will look into Boniva I recommend that she follow-up with Dr. Cruzita Lederer ENDO for bone density recommendations.

## 2022-05-23 NOTE — Assessment & Plan Note (Signed)
Intolerant of Prolia in past. Now intolerant of Actonel EC.  She will stop this medication given side effects. We discussed referral to endocrinology but it appears she has seen Dr. Cruzita Lederer in the past.  I will ask her to speak with Dr. Cruzita Lederer about whether IV Boniva versus Forteo is recommended

## 2022-05-23 NOTE — Assessment & Plan Note (Signed)
Acute in setting of recent start of Actonel in patient with history of reflux.  Most likely her symptoms are due to worsening GERD.  Minimal acute symptoms but given she did notice some chills in waiting room we can check a flu test.  She did have COVID less than 3 weeks ago, so recurrence is unlikely  Flu test returned negative  I have recommended that she stop Actonel given intolerable symptoms.  She will increase to pantoprazole 40 mg to twice daily until she is feeling better.  She will increase Pepcid AC to twice daily until she is feeling better as well.

## 2022-05-25 ENCOUNTER — Other Ambulatory Visit: Payer: Self-pay | Admitting: Family Medicine

## 2022-05-25 ENCOUNTER — Telehealth: Payer: Self-pay | Admitting: Family Medicine

## 2022-05-25 MED ORDER — GUAIFENESIN-CODEINE 100-10 MG/5ML PO SYRP: 5.0000 mL | ORAL_SOLUTION | Freq: Every evening | ORAL | 0 refills | Status: DC | PRN

## 2022-05-25 MED ORDER — BENZONATATE 200 MG PO CAPS: 200.0000 mg | ORAL_CAPSULE | Freq: Two times a day (BID) | ORAL | 0 refills | Status: DC | PRN

## 2022-05-25 NOTE — Telephone Encounter (Signed)
I spoke with pt; pt said seen 05/23/22. Pt increased pantoprazole 40 mg bid and Pepcid AC bid.pt said is not sure if increasing meds helped at all. On 05/24/22 dry cough worsened  and pt was not able to sleep last night due to coughing. Pt had SOB on and off last night after bad coughing episodes. Fever last night; unable to take temp but pt felt very warm and having chills. Pt took tylenol last night and pt does not feel warm today and no chills. Pt taking coricidin for cold and flu; pt said cough is very deep in chest. Pt throat is not as sore as it was. Pt has bad pain in left ear. Pt said did have wheezing last night and albuterol inhaler helped. No CP. Now pt has runny nose and when blows nose mucus is faint pink color. [facility].  Pt also sent my chart note on 05/25/22 about actenol and Boniva. Pt request Dr Diona Browner to review that as well and pt request cb about both issues after reviewed by Dr Diona Browner. Sending note to Dr Bascom Levels pool and will teams Butch Penny CMA.

## 2022-05-25 NOTE — Telephone Encounter (Signed)
Responded via MyChart.

## 2022-05-25 NOTE — Telephone Encounter (Signed)
Patient called and stated she still not feeling well and wanted to know if Dr. Diona Browner can prescribe something else for her. Call back number [phone].

## 2022-05-29 ENCOUNTER — Encounter: Payer: Self-pay | Admitting: Cardiology

## 2022-06-10 ENCOUNTER — Encounter: Payer: Self-pay | Admitting: Family Medicine

## 2022-06-12 ENCOUNTER — Ambulatory Visit: Payer: Medicare Other | Admitting: Family Medicine

## 2022-06-13 ENCOUNTER — Ambulatory Visit: Payer: Medicare Other | Admitting: Family Medicine

## 2022-06-13 DIAGNOSIS — Z9181 History of falling: Secondary | ICD-10-CM | POA: Diagnosis not present

## 2022-06-13 DIAGNOSIS — M5481 Occipital neuralgia: Secondary | ICD-10-CM | POA: Diagnosis not present

## 2022-06-13 DIAGNOSIS — I10 Essential (primary) hypertension: Secondary | ICD-10-CM | POA: Diagnosis not present

## 2022-06-13 DIAGNOSIS — D329 Benign neoplasm of meninges, unspecified: Secondary | ICD-10-CM | POA: Diagnosis not present

## 2022-06-14 ENCOUNTER — Encounter: Payer: Self-pay | Admitting: Internal Medicine

## 2022-06-14 ENCOUNTER — Ambulatory Visit (INDEPENDENT_AMBULATORY_CARE_PROVIDER_SITE_OTHER): Payer: PPO | Admitting: Internal Medicine

## 2022-06-14 VITALS — BP 118/80 | HR 65 | Ht 65.5 in | Wt 292.0 lb

## 2022-06-14 DIAGNOSIS — M810 Age-related osteoporosis without current pathological fracture: Secondary | ICD-10-CM

## 2022-06-14 DIAGNOSIS — E042 Nontoxic multinodular goiter: Secondary | ICD-10-CM | POA: Diagnosis not present

## 2022-06-14 NOTE — Progress Notes (Signed)
Patient ID: Kimberly Patton, female   DOB: [birth year], 67 y.o.   MRN: [id number]  HPI  Kimberly Patton is a 67 y.o.-year-old female, initially referred by her PCP, Dr. Diona Browner, returning for follow-up for thyroid nodules.  At today's visit, we also addressed her osteoporosis.  Interim hx: She had Covid 04/2022. She has had severe headaches since last visit.  She was started on high-dose steroids.  She had a temporal artery biopsy which returns negative.  She is off the steroids now.  She did have side effects from these: Tremors, weight gain.    Thyroid nodules: Patient has a history of thyroid nodules, previously stable but one of the nodules appears to have increased in size on the 2022 chest CT.  After the thyroid ultrasound confirmed this finding, she was referred to endocrinology.  Thyroid U/S (03/19/2018):  Right mid isoechoic nodule or pseudonodule, measuring 0.8 x 0.8 x 0.6 cm Left mid solid isoechoic nodule, measuring 3.5 x 2.1 x 1.5 cm  FNA (04/10/2018) of the dominant nodule: Benign (Bethesda category 2)  Thyroid U/S (01/12/2020):  Right mid thyroid nodule stable, measuring 3.4 x 2.4 x 2.1 cm  CT chest (01/11/2021): Right-sided thyroid nodule measuring at least 3.4 cm, appearing increased in size since previous evaluation  Thyroid U/S (08/04/2021): Parenchymal Echotexture: Mildly heterogenous  Isthmus: 0.3 cm Right lobe: 5.5 x 1.5 x 1.8 cm  Left lobe: 5.1 x 3.0 x 3.7 cm  _________________________________________________________   Study decreased size of previously visualized, benign-appearing solid nodule in the right inferior thyroid (labeled 1, 0.7 cm, previously 0.9 cm).   Nodule # 2:  Prior biopsy: Yes  Location: Left; Mid  Maximum size: 4.0 cm; Other 2 dimensions: 3.5 x 2.8 cm, previously, 3.4 x 2.1 x 2.4 cm  Composition: solid/almost completely solid (2)  Echogenicity: isoechoic (1) Significant change in size (>/= 20% in two dimensions and minimal increase of 2 mm):  Yes     _________________________________________________________   No new discrete nodules.  No cervical lymphadenopathy.   IMPRESSION: Slight interval enlargement of previously biopsied left mid solid thyroid nodule (labeled 2, 4.0 cm, previously 3.4 cm). Recommend correlation prior biopsy results.  FNA (09/05/2021): Clinical History: Patient is having AFFIRMA collection today also. Marland KitchenLeft  thyroid lobe mid pole Nodule #2 4.0 x 3.5 x 2.8cm  Specimen Submitted:  A. 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  I still recommended to go ahead with hemithyroidectomy.  Since then, however, she saw Dr. Harlow Asa and he recommended conservative follow-up.  Of note, after her biopsy, patient had shortness of breath and dysphagia after biopsy so I ordered a neck CT.  However, insurance delayed it so she ended up not having it done.  The symptoms resolved afterwards.  Pt denies: - feeling nodules in neck - hoarseness - dysphagia - choking  I reviewed pt's thyroid tests: Lab Results  Component Value Date   TSH 0.85 08/28/2021   TSH 0.66 11/23/2019   TSH 0.87 01/19/2019   TSH 0.86 03/19/2018   TSH 0.78 05/28/2017   TSH 1.428 05/05/2017   TSH 0.89 01/16/2016   TSH 1.040 02/23/2013   TSH 0.632  04/07/2010   Lab Results  Component Value Date   FREET4 1.09 01/19/2019   FREET4 1.00 03/19/2018   + FH of thyroid ds.: thyroid nodules:- mother, 2 sisters.  No FH of thyroid cancer. No h/o radiation tx to head or neck. No herbal supplements. No Biotin supplements or Hair, Skin and Nails vitamins.  Osteoporosis: - dx ~  2018  I reviewed  pt's DXA scan reports: Date L1-L4 T score FN T score 33% distal Radius Ultra distal radius  08/29/2021 N/a RFN: -3.0 LFN: -2.5 -2.0 -1.6  08/11/2019 -2.8 RFN: -2.8 LFN: -2.6 N/a N/a  12/04/2016 -2.7 RFN: -2.6 LFN: -2.7 N/a N/A   C telopeptide: Component     Latest Ref Rng 02/08/2022 05/11/2022  C-Telopeptide (CTx)      pg/mL 76  161   Normal range: 30-39 years: 60-650 pg/mL  40-49 years: 50-465 pg/mL  >49 years: Not Established   She denies falls or fractures.  No dizziness/vertigo/orthostasis/poor vision - except blurry vision in R eye.   Previous OP treatments:  - Prolia 11/08/2021 - 1 dose >> tooth pain, joint and muscle leg pain >> stopped - Actonel EC 35 mg weekly -started 05/2022 >> increased Heartburn, then developed a URI >> stopped 3 weeks ago She does have a history of GERD and is on Protonix.  No h/o vitamin D deficiency. Reviewed available vit D levels: Lab Results  Component Value Date   VD25OH 54.40 03/14/2022   VD25OH 33.36 09/30/2017   VD25OH 32.96 06/12/2017   Pt is on: - MVI - vitamin K2 - calcium (Caltrate) x2 a day - vitamin D 50,000 units weekly  She started weight bearing exercises - using elastic bands.  She cannot do leg weightbearing exercises due to right leg pain.  She had 2 mo of high dose steroids 2 mo ago before ruling out temporal arteritis >> caused Afib with RVR. Also, steroid drops in OD.  She does not take high vitamin A doses.  Menopause was early 67s.  She had gastric sleeve sx 2014-2015. Also hernia sx with mesh and hiatal hernia sx x2.  No FH of osteoporosis.  No h/o hyper/hypocalcemia or hyperparathyroidism. No h/o kidney stones. Lab Results  Component Value Date   PTH 62 03/14/2022   CALCIUM 9.3 05/11/2022   CALCIUM 8.9 03/24/2022   CALCIUM 9.6 03/14/2022   CALCIUM 9.5 09/21/2021   CALCIUM 9.5 02/24/2021   CALCIUM 9.5 12/27/2020   CALCIUM 9.5 10/17/2020   CALCIUM 9.6 08/23/2020   CALCIUM 9.6 04/18/2020   CALCIUM 9.7  02/24/2020   No h/o thyrotoxicosis. Reviewed TSH recent levels:  Lab Results  Component Value Date   TSH 0.85 08/28/2021   TSH 0.66 11/23/2019   TSH 0.87 01/19/2019   TSH 0.86 03/19/2018   TSH 0.78 05/28/2017   No h/o CKD. Last BUN/Cr: Lab Results  Component Value Date   BUN 25 (H) 05/11/2022   CREATININE 0.58 05/11/2022   Pt also has a history of SVT-sees cardiology.  Also, has a hiatal hernia repair with mesh - scar in the diaphragm mm >> occas. SOB.  She had a breast mass 05/2021 >> benign. She had a severe reaction to the COVID-vaccine.  She has a history of sleeve gastrectomy, but gained a significant amount of pounds during the coronavirus pandemic as she was not able to get out of bed. She has arthritis and osteoporosis. 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  ROS: + see HPI  Past Medical History:  Diagnosis Date   ALLERGIC RHINITIS 04/03/2007   Allergy    Not sure   Anemia    ((Pt Qnr Sub: Denies at visit from 08/28/2021 with endocrinology))    Anginal pain ([place])    Anxiety    Arthritis Not sure   Asthma    hx of years ago - no longer a problem    ASTHMA, PERSISTENT, MODERATE 04/03/2007   Cancer ([place])    skin    Dyspnea on exertion    a. 11/2007 Echo: EF 60%.   Dysrhythmia    hx of heart arrhythmia 10-15 years ago - followed by DR Lovena Le - not seen in years    GERD (gastroesophageal reflux disease)    Hemophilia carrier    Hypertension    Joint pain    HIPS / LEGS   Knee injury    RT   Meningioma ([place])    Morbid obesity ([place])    Neuromuscular disorder ([place]) Not sure, since my fall   Paroxysmal SVT (supraventricular tachycardia)    a. 11/2011 48h Holter: RSR, rare PVC's, occas PAC's   Pre-diabetes    Retinal tear of right eye 01/2016   Ventral hernia    Past Surgical History:  Procedure Laterality Date   [place]   ARTERY BIOPSY Left 03/21/2022   Procedure: BIOPSY  TEMPORAL ARTERY;  Surgeon: Katha Cabal, MD;  Location: [facility] ORS;  Service: Vascular;  Laterality: Left;   BIOPSY THYROID     08/2021   BREAST CYST EXCISION Right 1994   Benign   CESAREAN SECTION     x2   CHOLECYSTECTOMY  1995   COLONOSCOPY WITH PROPOFOL N/A 05/09/2021   Procedure: COLONOSCOPY WITH PROPOFOL;  Surgeon: [name], MD;  Location: [facility] ENDOSCOPY;  Service: Gastroenterology;  Laterality: N/A;   EYE SURGERY  01/2016   Repair retinal tear   FOOT SURGERY  1995 / 1996   x2   HERNIA REPAIR     KNEE ARTHROSCOPY W/ MENISCAL REPAIR  07/25/2013   right knee Dr. Mardelle Matte   LAPAROSCOPIC GASTRIC SLEEVE RESECTION N/A 06/08/2013   Procedure: LAPAROSCOPIC GASTRIC SLEEVE RESECTION AND EXCISION OF SEBACEUS CYST FROM MID CHEST takedown of incarcerated ventral hernia and primary repair endoscopy;  Surgeon: [name], MD;  Location: [facility] ORS;  Service: General;  Laterality: N/A;   LAPAROSCOPIC NISSEN FUNDOPLICATION N/A 86/76/1950   Procedure: LAPAROSCOPIC REPAIR LARGE SYMPTOMATIC HIATAL HERNIA WITH UPPER ENDOSCOPY;  Surgeon: Johnathan Hausen, MD;  Location: [facility] ORS;  Service: General;  Laterality: N/A;   moles  06/2013   removed 2 moles from under arm and  lowback   NECK SURGERY     occipital nerve damage- injections    OVARIAN CYST [place] EXCISIONAL BREAST BIOPSY Right 06/23/2021   Procedure: RADIOACTIVE SEED GUIDED EXCISIONAL RIGHT BREAST BIOPSY;  Surgeon: Johnathan Hausen, MD;  Location: [place];  Service: General;  Laterality: Right;   RETINAL TEAR REPAIR CRYOTHERAPY Right 01/2016   [place]   SKIN CANCER EXCISION     TONSILLECTOMY     TUBAL LIGATION  Think 1994   UPPER GI ENDOSCOPY  06/08/2013   Procedure: UPPER GI ENDOSCOPY;  Surgeon: [name], MD;  Location: [facility] ORS;  Service: General;;   US ECHOCARDIOGRAPHY  12/2011   WNL - EF 55-60%, mild MR, grade 1 diastolic dysnfiction (mild)  VENTRAL HERNIA REPAIR  2000   Social History   Socioeconomic History   Marital status: Divorced    Spouse name: Not on file   Number of children: 2   Years of education: 17   Highest education level: Not on file  Occupational History   Occupation: Product manager: [employer]  Tobacco Use   Smoking status: Never   Smokeless tobacco: Never   Tobacco comments:    smoke at age [age]  Vaping Use   Vaping Use: Never used  Substance and Sexual Activity   Alcohol use: Not Currently   Drug use: No   Sexual activity: Not Currently    Partners: Male    Birth control/protection: Abstinence, Pill  Other Topics Concern   Not on file  Social History Narrative   Lives at home alone.   Right-handed.   Occasional caffeine use.   Social Determinants of Health   Financial Resource Strain: Low Risk  (05/08/2022)   Overall Financial Resource Strain (CARDIA)    Difficulty of Paying Living Expenses: Not hard at all  Food Insecurity: No Food Insecurity (05/08/2022)   Hunger Vital Sign    Worried About Running Out of Food in the Last Year: Never true    Ran Out of Food in the Last Year: Never true  Transportation Needs: No Transportation Needs (05/08/2022)   PRAPARE - Hydrologist (Medical): No    Lack of Transportation (Non-Medical): No  Physical Activity: Inactive (05/08/2022)   Exercise Vital Sign    Days of Exercise per Week: 0 days    Minutes of Exercise per Session: 0 min  Stress: No Stress Concern Present (05/08/2022)   [place]    Feeling of Stress : Not at all  Social Connections: Moderately Integrated (05/08/2022)   Social Connection and Isolation Panel [NHANES]    Frequency of Communication with Friends and Family: More than three times a week    Frequency of Social Gatherings with Friends and Family: More than three times a week    Attends Religious Services:  More than 4 times per year    Active Member of [employer] or Organizations: Yes    Attends Music therapist: More than 4 times per year    Marital Status: Divorced  Intimate Partner Violence: Not At Risk (05/08/2022)   Humiliation, Afraid, Rape, and Kick questionnaire    Fear of Current or Ex-Partner: No    Emotionally Abused: No    Physically Abused: No    Sexually Abused: No   Current Outpatient Medications on File Prior to Visit  Medication Sig Dispense Refill   albuterol (VENTOLIN HFA) 108 (90 Base) MCG/ACT inhaler Inhale 2 puffs into the lungs every 6 (six) hours as needed for wheezing or shortness of breath. 8 g 2   benzonatate (TESSALON) 200 MG capsule Take 1 capsule (200 mg total) by mouth 2 (two) times daily as needed for cough. 20 capsule 0   budesonide-formoterol (SYMBICORT) 160-4.5 MCG/ACT inhaler Inhale 2 puffs into the lungs 2 (two) times daily. 1 each 5   Calcium Citrate-Vitamin D (CITRACAL + D PO) Take 2 tablets by mouth daily.      CARDIZEM 30 MG tablet Take 1 tablet (30 mg total) by mouth daily as needed (for elevated Heart rate). 30 tablet 3   Cholecalciferol (VITAMIN D3) 1.25 MG (50000 UT) CAPS Take 1 capsule by mouth once a week. 12  capsule 3   Coenzyme Q10-Vitamin E (QUNOL ULTRA COQ10) 100-150 MG-UNIT CAPS Take 100 mg by mouth daily.     Cyanocobalamin (VITAMIN B-12) 5000 MCG SUBL Place 5,000 mcg under the tongue daily.     denosumab (PROLIA) 60 MG/ML SOSY injection      diltiazem (CARDIZEM SR) 120 MG 12 hr capsule Take 1 capsule (120 mg total) by mouth 2 (two) times daily. 60 capsule 2   ELDERBERRY PO Take 1,000 mg by mouth daily.      famotidine (PEPCID) 40 MG tablet Take 40 mg by mouth at bedtime.     fluticasone (FLONASE) 50 MCG/ACT nasal spray SPRAY 2 SPRAYS INTO EACH NOSTRIL EVERY DAY 48 mL 1   gabapentin (NEURONTIN) 300 MG capsule Take 300 mg by mouth daily.     guaiFENesin-codeine (ROBITUSSIN AC) 100-10 MG/5ML syrup Take 5-10 mLs by mouth at bedtime as  needed for cough. 180 mL 0   hydrochlorothiazide (HYDRODIURIL) 25 MG tablet Take 1 tablet (25 mg total) by mouth daily. 90 tablet 3   Lactobacillus Rhamnosus, GG, (CULTURELLE IMMUNITY SUPPORT PO) Take 1 capsule by mouth daily.     Menaquinone-7 (VITAMIN K2 PO) Take 1 tablet by mouth daily.     montelukast (SINGULAIR) 10 MG tablet TAKE 1 TABLET BY MOUTH EVERYDAY AT BEDTIME 90 tablet 1   Multiple Vitamins-Minerals (CENTRUM SILVER 50+WOMEN PO) Take 1 tablet by mouth daily.     nitroGLYCERIN (NITROSTAT) 0.4 MG SL tablet Place 1 tablet (0.4 mg total) under the tongue every 5 (five) minutes as needed for chest pain. 25 tablet 3   Omega-3 Fatty Acids (FISH OIL) 1200 MG CAPS Take 1 capsule by mouth daily.     pantoprazole (PROTONIX) 40 MG tablet TAKE 1 TABLET BY MOUTH EVERY DAY 90 tablet 1   Riboflavin 100 MG CAPS Take 1 capsule by mouth daily.     risedronate (ACTONEL) 35 MG tablet Take 1 tablet (35 mg total) by mouth every 7 (seven) days. with water on empty stomach, nothing by mouth or lie down for next 30 minutes.Make sure this is actonel EC/coated 4 tablet 11   rosuvastatin (CRESTOR) 10 MG tablet Take 1 tablet (10 mg total) by mouth daily. 90 tablet 3   Semaglutide-Weight Management 0.25 MG/0.5ML SOAJ Inject 0.25 mg into the skin once a week for 28 days. 2 mL 0   [START ON 06/15/2022] Semaglutide-Weight Management 0.5 MG/0.5ML SOAJ Inject 0.5 mg into the skin once a week for 28 days. 2 mL 0   [START ON 07/14/2022] Semaglutide-Weight Management 1 MG/0.5ML SOAJ Inject 1 mg into the skin once a week for 28 days. 2 mL 0   [START ON 08/12/2022] Semaglutide-Weight Management 1.7 MG/0.75ML SOAJ Inject 1.7 mg into the skin once a week for 28 days. 3 mL 0   [START ON 09/10/2022] Semaglutide-Weight Management 2.4 MG/0.75ML SOAJ Inject 2.4 mg into the skin once a week for 28 days. 3 mL 0   TURMERIC PO Take 1 tablet by mouth daily.     Current Facility-Administered Medications on File Prior to Visit  Medication Dose  Route Frequency Provider Last Rate Last Admin   albuterol (PROVENTIL) (2.5 MG/3ML) 0.083% nebulizer solution 2.5 mg  2.5 mg Nebulization Once Tyler Pita, MD       Allergies  Allergen Reactions   Wasp Venom Anaphylaxis   Chlorhexidine Gluconate     Itching under breasts   Metronidazole Other (See Comments)    Chest tightness, neck tightness   Prednisone  Other (See Comments)    Severe migraine / with taper dose   Sodium Hypochlorite Rash    Liquid clorox bleach   Family History  Problem Relation Age of Onset   Breast cancer Mother    Diabetes Mother    Hypertension Mother    Kidney failure Mother    Diabetes Father    Hypertension Father    Diabetes Other    Hypertension Other    Stroke Other    Hemophilia Other    PE: BP 118/80 (BP Location: Right Arm, Patient Position: Sitting, Cuff Size: Normal)   Pulse 65   Ht 5' 5.5" (1.664 m)   Wt 292 lb (132.5 kg)   SpO2 99%   BMI 47.85 kg/m  Wt Readings from Last 3 Encounters:  06/14/22 292 lb (132.5 kg)  05/23/22 286 lb (129.7 kg)  05/22/22 286 lb (129.7 kg)   Constitutional: overweight, in NAD Eyes:  EOMI, no exophthalmos ENT: + Left thyromegaly, no cervical lymphadenopathy Cardiovascular: RRR, No MRG Respiratory: CTA B Musculoskeletal: no deformities Skin:no rashes Neurological: no tremor with outstretched hands  ASSESSMENT: 1. Thyroid nodules  2.  Osteoporosis  PLAN: 1. Thyroid nodules - I reviewed the images of her latest thyroid ultrasound. The dominant nodule is large, this being a risk factor for cancer.  Other risk factors for cancer may be: - hypoechogenicity - presence of microcalcifications - presence of internal blood flow - taller-than-wide distribution - irregular contours - The dominant nodule does not have any of the above characteristics. - Moreover, this nodule was biopsied in 2019 with benign results - Pt does not have a thyroid cancer family history or a personal history of RxTx to  head/neck. All these would favor benignity.  -However, reviewed biopsy in 08/2021 return as inconclusive (Bethesda category 4) and based on the size of the nodule and this result, I referred her for hemithyroidectomy. The Afirma molecular marker, however, returned benign, lowering the risk of cancer.  She did see Dr. Harlow Asa and a decision was made to follow her nodule conservatively. - She does not have neck compression symptoms -At today's visit, we ordered a new ultrasound. -We will not recheck her TFTs as they were normal at last check -I will see her back in a year.  2. OP - new pb for me - likely postmenopausal/age-related - Discussed about increased risk of fracture, depending on the T score, greatly increased when the T score is lower than -2.5, but it is actually a continuum and -2.5 should not be regarded as an absolute threshold. We reviewed her last 3 DXA scan reports together, and I explained that based on the T scores, she has an increased risk for fractures.  - we reviewed her supplemental calcium and vitamin D intake. She definitely gets the recommended 1000-1200 mg of calcium daily and h vitamin D level was recently at goal on weekly ergocalciferol 50,000 units. - discussed fall precautions   - given handout from [place] Re: weight bearing exercises - advised to do this every day or at least 5/7 days - We discussed about the different medication classes, benefits and side effects (including atypical fractures and ONJ - no dental workup in progress or planned, but she is seeing her dentist consistently).  - I explained that first options are parenteral medications due to her history of GERD.  She did try Actonel but this increased her GERD symptoms and did not continue.  She also tried sq denosumab (Prolia) x1 dose  but developed significant joint and muscle aches and had to stop.  I recommended zoledronic acid (Reclast) iv 1x a year for 3 years.  We could also  use teriparatide/Abaloparatide (which are daily subcu medication) or Romosozumab (monthly), but none of these will be first-line.  -Discussed about release last mechanism of action and expected benefits.  -Reviewed her calcium and vitamin D level from the end of last year and these were at goal so we can go ahead and schedule her Reclast infusion. - will check a new DXA scan in 1-2 years after the last -  I explained that the first indication that the treatment is working is her not having fractures. DXA scan changes are secondary: unchanged or slightly higher T-scores are desirable - will see pt back in a year  Orders Placed This Encounter  Procedures   US THYROID   - Total time spent for the visit: 45 min, in precharting, postcharting, reviewing Dr. Nonda Lou notes, obtaining medical information from the chart and from the pt, reviewing her  previous labs, evaluations, and treatments, reviewing her symptoms, counseling her about her osteoporosis and thyroid condition (please see the discussed topics above), and developing a plan to further investigate and treat them; she had a number of questions which I addressed.  Philemon Kingdom, MD PhD [facility] Endocrinology

## 2022-06-14 NOTE — Patient Instructions (Signed)
We will schedule a new thyroid ultrasound for you.  Please come back for a follow-up appointment in 1 year.  How Can I Prevent Falls? Men and women with osteoporosis need to take care not to fall down. Falls can break bones. Some reasons people fall are: Poor vision  Poor balance  Certain diseases that affect how you walk  Some types of medicine, such as sleeping pills.  Some tips to help prevent falls outdoors are: Use a cane or walker  Wear rubber-soled shoes so you don't slip  Walk on grass when sidewalks are slippery  In winter, put salt or kitty litter on icy sidewalks.  Some ways to help prevent falls indoors are: Keep rooms free of clutter, especially on floors  Use plastic or carpet runners on slippery floors  Wear low-heeled shoes that provide good support  Do not walk in socks, stockings, or slippers  Be sure carpets and area rugs have skid-proof backs or are tacked to the floor  Be sure stairs are well lit and have rails on both sides  Put grab bars on bathroom walls near tub, shower, and toilet  Use a rubber bath mat in the shower or tub  Keep a flashlight next to your bed  Use a sturdy step stool with a handrail and wide steps  Add more lights in rooms (and night lights) Buy a cordless phone to keep with you so that you don't have to rush to the phone       when it rings and so that you can call for help if you fall.   (adapted from http://www.niams.NightlifePreviews.se)  Exercise for Strong Bones (from [place]) There are two types of exercises that are important for building and maintaining bone density:  weight-bearing and muscle-strengthening exercises. Weight-bearing Exercises These exercises include activities that make you move against gravity while staying upright. Weight-bearing exercises can be high-impact or low-impact. High-impact weight-bearing exercises help build bones and keep them strong. If  you have broken a bone due to osteoporosis or are at risk of breaking a bone, you may need to avoid high-impact exercises. If you're not sure, you should check with your healthcare provider. Examples of high-impact weight-bearing exercises are: Dancing Doing high-impact aerobics Hiking Jogging/running Jumping Rope Stair climbing Tennis Low-impact weight-bearing exercises can also help keep bones strong and are a safe alternative if you cannot do high-impact exercises. Examples of low-impact weight-bearing exercises are: Using elliptical training machines Doing low-impact aerobics Using stair-step machines Fast walking on a treadmill or outside Muscle-Strengthening Exercises These exercises include activities where you move your body, a weight or some other resistance against gravity. They are also known as resistance exercises and include: Lifting weights Using elastic exercise bands Using weight machines Lifting your own body weight Functional movements, such as standing and rising up on your toes Yoga and Pilates can also improve strength, balance and flexibility. However, certain positions may not be safe for people with osteoporosis or those at increased risk of broken bones. For example, exercises that have you bend forward may increase the chance of breaking a bone in the spine. A physical therapist should be able to help you learn which exercises are safe and appropriate for you. Non-Impact Exercises Non-impact exercises can help you to improve balance, posture and how well you move in everyday activities. These exercises can also help to increase muscle strength and decrease the risk of falls and broken bones. Some of these exercises include: Balance exercises that strengthen  your legs and test your balance, such as Tai Chi, can decrease your risk of falls. Posture exercises that improve your posture and reduce rounded or "sloping" shoulders can help you decrease the chance of  breaking a bone, especially in the spine. Functional exercises that improve how well you move can help you with everyday activities and decrease your chance of falling and breaking a bone. For example, if you have trouble getting up from a chair or climbing stairs, you should do these activities as exercises. A physical therapist can teach you balance, posture and functional exercises. Starting a New Exercise Program If you haven't exercised regularly for a while, check with your healthcare provider before beginning a new exercise program--particularly if you have health problems such as heart disease, diabetes or high blood pressure. If you're at high risk of breaking a bone, you should work with a physical therapist to develop a safe exercise program. Once you have your healthcare provider's approval, start slowly. If you've already broken bones in the spine because of osteoporosis, be very careful to avoid activities that require reaching down, bending forward, rapid twisting motions, heavy lifting and those that increase your chance of a fall. As you get started, your muscles may feel sore for a day or two after you exercise. If soreness lasts longer, you may be working too hard and need to ease up. Exercises should be done in a pain-free range of motion. How Much Exercise Do You Need? Weight-bearing exercises 30 minutes on most days of the week. Do a 30-minutesession or multiple sessions spread out throughout the day. The benefits to your bones are the same.   Muscle-strengthening exercises Two to three days per week. If you don't have much time for strengthening/resistance training, do small amounts at a time. You can do just one body part each day. For example do arms one day, legs the next and trunk the next. You can also spread these exercises out during your normal day.  Balance, posture and functional exercises Every day or as often as needed. You may want to focus on one area more than the  others. If you have fallen or lose your balance, spend time doing balance exercises. If you are getting rounded shoulders, work more on posture exercises. If you have trouble climbing stairs or getting up from the couch, do more functional exercises. You can also perform these exercises at one time or spread them during your day. Work with a phyiscal therapist to learn the right exercises for you.

## 2022-06-18 ENCOUNTER — Ambulatory Visit: Payer: Medicare Other | Admitting: Pulmonary Disease

## 2022-06-18 ENCOUNTER — Telehealth: Payer: Self-pay | Admitting: Pharmacy Technician

## 2022-06-18 ENCOUNTER — Encounter: Payer: Self-pay | Admitting: Internal Medicine

## 2022-06-18 ENCOUNTER — Ambulatory Visit: Payer: Medicare Other | Admitting: Primary Care

## 2022-06-18 NOTE — Telephone Encounter (Signed)
Dr. Cruzita Lederer, Juluis Rainier note:  Auth Submission: NO AUTH NEEDED Payer: BCBS Medication & CPT/J Code(s) submitted: Reclast (Zolendronic acid) [id number] Route of submission (phone, fax, portal):  Phone # Fax # Auth type: Buy/Bill Units/visits requested: X1 Reference number:  Approval from: 06/18/22 to 06/19/23

## 2022-06-18 NOTE — Telephone Encounter (Signed)
Yes, patient will be scheduled as soon as possible. Ty.

## 2022-06-19 ENCOUNTER — Encounter: Payer: Self-pay | Admitting: Family Medicine

## 2022-06-20 DIAGNOSIS — R519 Headache, unspecified: Secondary | ICD-10-CM | POA: Diagnosis not present

## 2022-06-22 ENCOUNTER — Other Ambulatory Visit: Payer: Self-pay | Admitting: Gastroenterology

## 2022-06-22 DIAGNOSIS — Z9889 Other specified postprocedural states: Secondary | ICD-10-CM

## 2022-06-22 DIAGNOSIS — K219 Gastro-esophageal reflux disease without esophagitis: Secondary | ICD-10-CM

## 2022-06-22 DIAGNOSIS — R09A2 Foreign body sensation, throat: Secondary | ICD-10-CM

## 2022-06-22 NOTE — Progress Notes (Deleted)
MRN : [id number]  Kimberly Patton is a 67 y.o. (May 23, 1956) female who presents with chief complaint of varicose veins hurt.  History of Present Illness: ***  No outpatient medications have been marked as taking for the 06/25/22 encounter (Appointment) with Delana Meyer, Dolores Lory, MD.    Past Medical History:  Diagnosis Date   ALLERGIC RHINITIS 04/03/2007   Allergy    Not sure   Anemia    ((Pt Qnr Sub: Denies at visit from 08/28/2021 with endocrinology))    Anginal pain ([place])    Anxiety    Arthritis Not sure   Asthma    hx of years ago - no longer a problem    ASTHMA, PERSISTENT, MODERATE 04/03/2007   Cancer ([place])    skin    Dyspnea on exertion    a. 11/2007 Echo: EF 60%.   Dysrhythmia    hx of heart arrhythmia 10-15 years ago - followed by DR Lovena Le - not seen in years    GERD (gastroesophageal reflux disease)    Hemophilia carrier    Hypertension    Joint pain    HIPS / LEGS   Knee injury    RT   Meningioma ([place])    Morbid obesity ([place])    Neuromuscular disorder ([place]) Not sure, since my fall   Paroxysmal SVT (supraventricular tachycardia)    a. 11/2011 48h Holter: RSR, rare PVC's, occas PAC's   Pre-diabetes    Retinal tear of right eye 01/2016   Ventral hernia     Past Surgical History:  Procedure Laterality Date   [place]   ARTERY BIOPSY Left 03/21/2022   Procedure: BIOPSY TEMPORAL ARTERY;  Surgeon: Katha Cabal, MD;  Location: [facility] ORS;  Service: Vascular;  Laterality: Left;   BIOPSY THYROID     08/2021   BREAST CYST EXCISION Right 1994   Benign   CESAREAN SECTION     x2   CHOLECYSTECTOMY  1995   COLONOSCOPY WITH PROPOFOL N/A 05/09/2021   Procedure: COLONOSCOPY WITH PROPOFOL;  Surgeon: [name], MD;  Location: [facility] ENDOSCOPY;  Service: Gastroenterology;  Laterality: N/A;   EYE SURGERY  01/2016   Repair retinal tear   FOOT SURGERY  1995 / 1996   x2   HERNIA REPAIR     KNEE ARTHROSCOPY W/  MENISCAL REPAIR  07/25/2013   right knee Dr. Mardelle Matte   LAPAROSCOPIC GASTRIC SLEEVE RESECTION N/A 06/08/2013   Procedure: LAPAROSCOPIC GASTRIC SLEEVE RESECTION AND EXCISION OF SEBACEUS CYST FROM MID CHEST takedown of incarcerated ventral hernia and primary repair endoscopy;  Surgeon: [name], MD;  Location: [facility] ORS;  Service: General;  Laterality: N/A;   LAPAROSCOPIC NISSEN FUNDOPLICATION N/A [id number]   Procedure: LAPAROSCOPIC REPAIR LARGE SYMPTOMATIC HIATAL HERNIA WITH UPPER ENDOSCOPY;  Surgeon: Johnathan Hausen, MD;  Location: [facility] ORS;  Service: General;  Laterality: N/A;   moles  06/2013   removed 2 moles from under arm and  lowback   NECK SURGERY     occipital nerve damage- injections    OVARIAN CYST [place] EXCISIONAL BREAST BIOPSY Right 06/23/2021   Procedure: RADIOACTIVE SEED GUIDED EXCISIONAL RIGHT BREAST BIOPSY;  Surgeon: Johnathan Hausen, MD;  Location: [facility];  Service: General;  Laterality: Right;   RETINAL TEAR REPAIR CRYOTHERAPY Right 01/2016   [place]   SKIN CANCER EXCISION  TONSILLECTOMY     TUBAL LIGATION  Think 1994   UPPER GI ENDOSCOPY  06/08/2013   Procedure: UPPER GI ENDOSCOPY;  Surgeon: [name], MD;  Location: [facility] ORS;  Service: General;;   US ECHOCARDIOGRAPHY  12/2011   WNL - EF 55-60%, mild MR, grade 1 diastolic dysnfiction (mild)   VENTRAL HERNIA REPAIR  2000    Social History Social History   Tobacco Use   Smoking status: Never   Smokeless tobacco: Never   Tobacco comments:    smoke at age 49-12  Vaping Use   Vaping Use: Never used  Substance Use Topics   Alcohol use: Not Currently   Drug use: No    Family History Family History  Problem Relation Age of Onset   Breast cancer Mother    Diabetes Mother    Hypertension Mother    Kidney failure Mother    Diabetes Father    Hypertension Father    Diabetes Other    Hypertension Other    Stroke Other     Hemophilia Other     Allergies  Allergen Reactions   Wasp Venom Anaphylaxis   Chlorhexidine Gluconate     Itching under breasts   Metronidazole Other (See Comments)    Chest tightness, neck tightness   Prednisone Other (See Comments)    Severe migraine / with taper dose   Sodium Hypochlorite Rash    Liquid clorox bleach     REVIEW OF SYSTEMS (Negative unless checked)  Constitutional: []$ Weight loss  []$ Fever  []$ Chills Cardiac: []$ Chest pain   []$ Chest pressure   []$ Palpitations   []$ Shortness of breath when laying flat   []$ Shortness of breath with exertion. Vascular:  []$ Pain in legs with walking   [x]$ Pain in legs with standing  []$ History of DVT   []$ Phlebitis   []$ Swelling in legs   [x]$ Varicose veins   []$ Non-healing ulcers Pulmonary:   []$ Uses home oxygen   []$ Productive cough   []$ Hemoptysis   []$ Wheeze  []$ COPD   []$ Asthma Neurologic:  []$ Dizziness   []$ Seizures   []$ History of stroke   []$ History of TIA  []$ Aphasia   []$ Vissual changes   []$ Weakness or numbness in arm   []$ Weakness or numbness in leg Musculoskeletal:   []$ Joint swelling   []$ Joint pain   []$ Low back pain Hematologic:  []$ Easy bruising  []$ Easy bleeding   []$ Hypercoagulable state   []$ Anemic Gastrointestinal:  []$ Diarrhea   []$ Vomiting  []$ Gastroesophageal reflux/heartburn   []$ Difficulty swallowing. Genitourinary:  []$ Chronic kidney disease   []$ Difficult urination  []$ Frequent urination   []$ Blood in urine Skin:  []$ Rashes   []$ Ulcers  Psychological:  []$ History of anxiety   []$  History of major depression.  Physical Examination  There were no vitals filed for this visit. There is no height or weight on file to calculate BMI. Gen: WD/WN, NAD Head: [place]/AT, No temporalis wasting.  Ear/Nose/Throat: Hearing grossly intact, nares w/o erythema or drainage, pinna without lesions Eyes: PER, EOMI, sclera nonicteric.  Neck: Supple, no gross masses.  No JVD.  Pulmonary:  Good air movement, no audible wheezing, no use of accessory muscles.  Cardiac: RRR,  precordium not hyperdynamic. Vascular:  Large varicosities present, greater than 10 mm ***.  Veins are tender to palpation  Mild venous stasis changes to the legs bilaterally.  Trace soft pitting edema CEAP C3sEpAsPr Vessel Right Left  Radial Palpable Palpable  Gastrointestinal: soft, non-distended. No guarding/no peritoneal signs.  Musculoskeletal: M/S 5/5 throughout.  No deformity.  Neurologic: CN 2-12 intact. Pain and light touch  intact in extremities.  Symmetrical.  Speech is fluent. Motor exam as listed above. Psychiatric: Judgment intact, Mood & affect appropriate for pt's clinical situation. Dermatologic: Venous rashes no ulcers noted.  No changes consistent with cellulitis. Lymph : No lichenification or skin changes of chronic lymphedema.  CBC Lab Results  Component Value Date   WBC 14.0 (H) 03/24/2022   HGB 13.2 03/24/2022   HCT 42.7 03/24/2022   MCV 84.1 03/24/2022   PLT 297 03/24/2022    BMET    Component Value Date/Time   NA 140 05/11/2022 0718   NA 139 01/21/2020 1137   K 3.6 05/11/2022 0718   CL 102 05/11/2022 0718   CO2 29 05/11/2022 0718   GLUCOSE 85 05/11/2022 0718   BUN 25 (H) 05/11/2022 0718   BUN 16 01/21/2020 1137   CREATININE 0.58 05/11/2022 0718   CREATININE 0.57 01/01/2020 1623   CALCIUM 9.3 05/11/2022 0718   GFRNONAA >60 03/24/2022 1040   GFRAA >60 02/02/2020 1417   CrCl cannot be calculated (Patient's most recent lab result is older than the maximum 21 days allowed.).  COAG Lab Results  Component Value Date   INR 1.0 12/03/2007   INR 1.0 11/25/2007    Radiology No results found.   Assessment/Plan There are no diagnoses linked to this encounter.   Hortencia Pilar, MD  06/22/2022 4:32 PM

## 2022-06-25 ENCOUNTER — Ambulatory Visit (INDEPENDENT_AMBULATORY_CARE_PROVIDER_SITE_OTHER): Payer: Medicare Other | Admitting: Vascular Surgery

## 2022-06-25 DIAGNOSIS — I10 Essential (primary) hypertension: Secondary | ICD-10-CM

## 2022-06-25 DIAGNOSIS — I872 Venous insufficiency (chronic) (peripheral): Secondary | ICD-10-CM

## 2022-06-25 DIAGNOSIS — M546 Pain in thoracic spine: Secondary | ICD-10-CM | POA: Diagnosis not present

## 2022-06-25 DIAGNOSIS — I480 Paroxysmal atrial fibrillation: Secondary | ICD-10-CM

## 2022-06-25 DIAGNOSIS — M654 Radial styloid tenosynovitis [de Quervain]: Secondary | ICD-10-CM | POA: Diagnosis not present

## 2022-06-25 DIAGNOSIS — I25118 Atherosclerotic heart disease of native coronary artery with other forms of angina pectoris: Secondary | ICD-10-CM

## 2022-06-25 DIAGNOSIS — I8310 Varicose veins of unspecified lower extremity with inflammation: Secondary | ICD-10-CM

## 2022-06-26 ENCOUNTER — Ambulatory Visit: Payer: BC Managed Care – PPO | Admitting: Family Medicine

## 2022-06-28 ENCOUNTER — Ambulatory Visit (INDEPENDENT_AMBULATORY_CARE_PROVIDER_SITE_OTHER): Payer: PPO

## 2022-06-28 VITALS — BP 123/80 | HR 58 | Temp 98.6°F | Resp 18 | Ht 66.0 in | Wt 286.6 lb

## 2022-06-28 DIAGNOSIS — M810 Age-related osteoporosis without current pathological fracture: Secondary | ICD-10-CM

## 2022-06-28 MED ORDER — ACETAMINOPHEN 325 MG PO TABS
650.0000 mg | ORAL_TABLET | Freq: Once | ORAL | Status: AC
  Administered 2022-06-28: 650 mg via ORAL
  Filled 2022-06-28: qty 2

## 2022-06-28 MED ORDER — SODIUM CHLORIDE 0.9 % IV SOLN: INTRAVENOUS | Status: DC

## 2022-06-28 MED ORDER — DIPHENHYDRAMINE HCL 25 MG PO CAPS
25.0000 mg | ORAL_CAPSULE | Freq: Once | ORAL | Status: AC
  Administered 2022-06-28: 25 mg via ORAL
  Filled 2022-06-28: qty 1

## 2022-06-28 MED ORDER — ZOLEDRONIC ACID 5 MG/100ML IV SOLN
5.0000 mg | Freq: Once | INTRAVENOUS | Status: AC
  Administered 2022-06-28: 5 mg via INTRAVENOUS
  Filled 2022-06-28: qty 100

## 2022-06-28 NOTE — Progress Notes (Signed)
Diagnosis: Osteoporosis  Provider:  Marshell Garfinkel MD  Procedure: Infusion  IV Type: Peripheral, IV Location: R Antecubital  Reclast (Zolendronic Acid), Dose: 5 mg  Infusion Start Time: 0903  Infusion Stop Time: 5997  Post Infusion IV Care: Observation period completed and Peripheral IV Discontinued  Discharge: Condition: Good, Destination: Home . AVS provided to patient.   Performed by:  Adelina Mings, LPN

## 2022-06-29 ENCOUNTER — Emergency Department (HOSPITAL_COMMUNITY)
Admission: EM | Admit: 2022-06-29 | Discharge: 2022-06-29 | Disposition: A | Discharge status: Home / Self Care | Payer: PPO | Attending: Emergency Medicine | Admitting: Emergency Medicine

## 2022-06-29 ENCOUNTER — Telehealth: Payer: Self-pay | Admitting: Family Medicine

## 2022-06-29 ENCOUNTER — Ambulatory Visit: Payer: Medicare Other | Admitting: Pulmonary Disease

## 2022-06-29 ENCOUNTER — Encounter: Payer: Self-pay | Admitting: Internal Medicine

## 2022-06-29 ENCOUNTER — Telehealth: Payer: Self-pay | Admitting: Pulmonary Disease

## 2022-06-29 ENCOUNTER — Encounter (HOSPITAL_COMMUNITY): Payer: Self-pay | Admitting: Emergency Medicine

## 2022-06-29 ENCOUNTER — Other Ambulatory Visit: Payer: Self-pay

## 2022-06-29 DIAGNOSIS — R110 Nausea: Secondary | ICD-10-CM | POA: Diagnosis not present

## 2022-06-29 DIAGNOSIS — R509 Fever, unspecified: Secondary | ICD-10-CM | POA: Diagnosis not present

## 2022-06-29 DIAGNOSIS — R000 Tachycardia, unspecified: Secondary | ICD-10-CM | POA: Insufficient documentation

## 2022-06-29 DIAGNOSIS — T50995A Adverse effect of other drugs, medicaments and biological substances, initial encounter: Secondary | ICD-10-CM | POA: Diagnosis not present

## 2022-06-29 DIAGNOSIS — I10 Essential (primary) hypertension: Secondary | ICD-10-CM | POA: Diagnosis not present

## 2022-06-29 DIAGNOSIS — R52 Pain, unspecified: Secondary | ICD-10-CM | POA: Insufficient documentation

## 2022-06-29 DIAGNOSIS — T50905A Adverse effect of unspecified drugs, medicaments and biological substances, initial encounter: Secondary | ICD-10-CM

## 2022-06-29 DIAGNOSIS — F419 Anxiety disorder, unspecified: Secondary | ICD-10-CM | POA: Insufficient documentation

## 2022-06-29 DIAGNOSIS — Z20822 Contact with and (suspected) exposure to covid-19: Secondary | ICD-10-CM | POA: Insufficient documentation

## 2022-06-29 DIAGNOSIS — E876 Hypokalemia: Secondary | ICD-10-CM | POA: Diagnosis not present

## 2022-06-29 DIAGNOSIS — R42 Dizziness and giddiness: Secondary | ICD-10-CM | POA: Diagnosis not present

## 2022-06-29 LAB — BASIC METABOLIC PANEL
Anion gap: 12 (ref 5–15)
BUN: 26 mg/dL — ABNORMAL HIGH (ref 8–23)
CO2: 26 mmol/L (ref 22–32)
Calcium: 9.3 mg/dL (ref 8.9–10.3)
Chloride: 98 mmol/L (ref 98–111)
Creatinine, Ser: 0.78 mg/dL (ref 0.44–1.00)
GFR, Estimated: >60 mL/min (ref >60)
Glucose, Bld: 131 mg/dL — ABNORMAL HIGH (ref 70–99)
Potassium: 3.1 mmol/L — ABNORMAL LOW (ref 3.5–5.1)
Sodium: 136 mmol/L (ref 135–145)

## 2022-06-29 LAB — CBC WITH DIFFERENTIAL/PLATELET
Abs Immature Granulocytes: 0.05 10*3/uL (ref 0.00–0.07)
Basophils Absolute: 0 10*3/uL (ref 0.0–0.1)
Basophils Relative: 0 %
Eosinophils Absolute: 0 10*3/uL (ref 0.0–0.5)
Eosinophils Relative: 0 %
HCT: 37.3 % (ref 36.0–46.0)
Hemoglobin: 12 g/dL (ref 12.0–15.0)
Immature Granulocytes: 1 %
Lymphocytes Relative: 4 %
Lymphs Abs: 0.4 10*3/uL — ABNORMAL LOW (ref 0.7–4.0)
MCH: 27.5 pg (ref 26.0–34.0)
MCHC: 32.2 g/dL (ref 30.0–36.0)
MCV: 85.6 fL (ref 80.0–100.0)
Monocytes Absolute: 0.4 10*3/uL (ref 0.1–1.0)
Monocytes Relative: 4 %
Neutro Abs: 9.1 10*3/uL — ABNORMAL HIGH (ref 1.7–7.7)
Neutrophils Relative %: 91 %
Platelets: 254 10*3/uL (ref 150–400)
RBC: 4.36 MIL/uL (ref 3.87–5.11)
RDW: 14.5 % (ref 11.5–15.5)
WBC: 10 10*3/uL (ref 4.0–10.5)
nRBC: 0 % (ref 0.0–0.2)

## 2022-06-29 LAB — RESP PANEL BY RT-PCR (RSV, FLU A&B, COVID)  RVPGX2
Influenza A by PCR: NEGATIVE
Influenza B by PCR: NEGATIVE
Resp Syncytial Virus by PCR: NEGATIVE
SARS Coronavirus 2 by RT PCR: NEGATIVE

## 2022-06-29 LAB — TROPONIN I (HIGH SENSITIVITY)
Troponin I (High Sensitivity): 7 ng/L (ref <18)
Troponin I (High Sensitivity): 7 ng/L (ref <18)

## 2022-06-29 MED ORDER — ACETAMINOPHEN 325 MG PO TABS
650.0000 mg | ORAL_TABLET | Freq: Once | ORAL | Status: AC
  Administered 2022-06-29: 650 mg via ORAL
  Filled 2022-06-29: qty 2

## 2022-06-29 MED ORDER — POTASSIUM CHLORIDE CRYS ER 20 MEQ PO TBCR
40.0000 meq | EXTENDED_RELEASE_TABLET | Freq: Once | ORAL | Status: AC
  Administered 2022-06-29: 40 meq via ORAL
  Filled 2022-06-29: qty 2

## 2022-06-29 MED ORDER — SODIUM CHLORIDE 0.9 % IV BOLUS
1000.0000 mL | Freq: Once | INTRAVENOUS | Status: AC
  Administered 2022-06-29: 1000 mL via INTRAVENOUS

## 2022-06-29 NOTE — Telephone Encounter (Signed)
Noted.  Patient will call back to reschedule.  Nothing further needed.

## 2022-06-29 NOTE — Telephone Encounter (Signed)
Noted. This is fairly common side effect possible with Reclast, but  given my limited experience with it definitely recommend/request advice for patient from ENDO, Dr. Cruzita Lederer.

## 2022-06-29 NOTE — Telephone Encounter (Signed)
[facility] - Client TELEPHONE ADVICE RECORD [facility] Patient Name: Kimberly Patton Gender: Female DOB: 08-21-1955 Age: 67 Y 34 M Return Phone Number: [phone] (Primary) Address: City/ State/ Zip: [place]  [address] Client [facility] - Client Client Site [place] - Day Provider [name] - MD Contact Type Call Who Is Calling Patient / Member / Family / Caregiver Call Type Triage / Clinical Relationship To Patient Self Return Phone Number [phone] (Primary) Chief Complaint FEVER - greater than or equal to 104 or less than 97 Reason for Call Symptomatic / Request for [place] states that she went to the hospital yesterday with a high fever, abnormal heart rate, and osteoporosis pain. She was discharged last night, but now has a fever of 104. Translation No Nurse Assessment Nurse: [place], RN, Estill Bamberg Date/Time (Eastern Time): 06/29/2022 12:39:54 PM Confirm and document reason for call. If symptomatic, describe symptoms. ---caller states she was at [facility] yesterday. was told side effects from reclast infusion. she is shaking and temp 103 Does the patient have any new or worsening symptoms? ---Yes Will a triage be completed? ---Yes Related visit to physician within the last 2 weeks? ---Yes Does the PT have any chronic conditions? (i.e. diabetes, asthma, this includes High risk factors for pregnancy, etc.) ---Yes List chronic conditions. ---osteoporosis Is this a behavioral health or substance abuse call? ---No Guidelines Guideline Title Affirmed Question Affirmed Notes Nurse Date/Time (Eastern Time) Fever [1] Fever > 101 F (38.3 C) AND [2] age > 50 years Humfleet, RN, Estill Bamberg 06/29/2022 12:41:47 PM Disp. Time Eilene Ghazi Time) Disposition Final User 06/29/2022 12:38:28 PM Send to Urgent Joneen Boers PLEASE NOTE: All timestamps  contained within this report are represented as [place] Standard Time. CONFIDENTIALTY NOTICE: This fax transmission is intended only for the addressee. It contains information that is legally privileged, confidential or otherwise protected from use or disclosure. If you are not the intended recipient, you are strictly prohibited from reviewing, disclosing, copying using or disseminating any of this information or taking any action in reliance on or regarding this information. If you have received this fax in error, please notify us immediately by telephone so that we can arrange for its return to [place]. Phone: [phone], Toll-Free: [phone], Fax: [phone] Page: 2 of 2 Call Id: [id number] [place]. Time Eilene Ghazi Time) Disposition Final User 06/29/2022 12:48:38 PM See HCP within 4 Hours (or PCP triage) Yes Humfleet, RN, Estill Bamberg Final Disposition 06/29/2022 12:48:38 PM See HCP within 4 Hours (or PCP triage) Yes [place], RN, Shelly Coss Disagree/Comply Comply Caller Understands Yes PreDisposition InappropriateToAsk Care Advice Given Per Guideline SEE HCP (OR PCP TRIAGE) WITHIN 4 HOURS: * IF OFFICE WILL BE OPEN: You need to be seen within the next 3 or 4 hours. Call your doctor (or NP/PA) now or as soon as the office opens. * You become worse CALL BACK IF: CARE ADVICE given per Fever (Adult) guideline. Comments User: Rozelle Logan, RN Date/Time Eilene Ghazi Time): 06/29/2022 12:44:47 PM took 2 tylenol at 730am. shaking started an hour and half ago. User: Rozelle Logan, RN Date/Time Eilene Ghazi Time): 06/29/2022 12:50:52 PM patient was advised to go back to the ER. she is unsure if she will go Referrals GO TO FACILITY UNDECIDE

## 2022-06-29 NOTE — ED Notes (Signed)
RN reviewed discharge instructions with pt. Pt verbalized understanding and had no further questions. VSS upon discharge. ?

## 2022-06-29 NOTE — Telephone Encounter (Signed)
Appt canceled for patient request.  Lm for patient.

## 2022-06-29 NOTE — ED Triage Notes (Signed)
Pt recently had reclast infusion for osteoporosis on 1/18. Pt was told infusion side effects are fever, tachycardia, n/v and dizziness. Pt expericing fever, tachycardia and nausea onset 2030. Pt took tylenol at approx 2100 for fever.   T 100.6 HR 120, BP ,154/86, CBG 136 nsn

## 2022-06-29 NOTE — Discharge Instructions (Addendum)
Continue taking Tylenol and drinking fluids.  Return if symptoms change or worsen.

## 2022-06-29 NOTE — Telephone Encounter (Signed)
Patient was seen in the ED for the symptoms noted below.  We can reschedule her for follow-up appointment next available.

## 2022-06-29 NOTE — ED Provider Notes (Signed)
[facility] Emergency Department Provider Note MRN:  [id number]  Arrival date & time: 06/29/22     Chief Complaint   Tachycardia   History of Present Illness   Kimberly Patton is a 67 y.o. year-old female presents to the ED with chief complaint of fever and body aches.  She states that she had a Reclast infusion yesterday.  States that side effects were expected to be fevers, chills, and body aches.  She states that the symptoms started in the evening.  She has been taking Tylenol.  She denies cough or sore throat.  She states that she feels anxious and might have had a panic attack.  History provided by patient.   Review of Systems  Pertinent positive and negative review of systems noted in HPI.    Physical Exam   Vitals:   06/29/22 0600 06/29/22 0645  BP: 128/73 133/78  Pulse: 93 92  Resp: 17 18  Temp:    SpO2: 95% 97%    CONSTITUTIONAL:  nontoxic-appearing, NAD NEURO:  Alert and oriented x 3, CN 3-12 grossly intact EYES:  eyes equal and reactive ENT/NECK:  Supple, no stridor  CARDIO:  tachycardic, regular rhythm, appears well-perfused  PULM:  No respiratory distress, CTAB GI/GU:  non-distended,  MSK/SPINE:  No gross deformities, no edema, moves all extremities  SKIN:  no rash, atraumatic   *Additional and/or pertinent findings included in MDM below  Diagnostic and Interventional Summary    EKG Interpretation  Date/Time:    Ventricular Rate:    PR Interval:    QRS Duration:   QT Interval:    QTC Calculation:   R Axis:     Text Interpretation:         Labs Reviewed  CBC WITH DIFFERENTIAL/PLATELET - Abnormal; Notable for the following components:      Result Value   Neutro Abs 9.1 (*)    Lymphs Abs 0.4 (*)    All other components within normal limits  BASIC METABOLIC PANEL - Abnormal; Notable for the following components:   Potassium 3.1 (*)    Glucose, Bld 131 (*)    BUN 26 (*)    All other components within normal limits   RESP PANEL BY RT-PCR (RSV, FLU A&B, COVID)  [id number]  TROPONIN I (HIGH SENSITIVITY)  TROPONIN I (HIGH SENSITIVITY)    No orders to display    Medications  acetaminophen (TYLENOL) tablet 650 mg (650 mg Oral Given 06/29/22 0315)  sodium chloride 0.9 % bolus 1,000 mL (1,000 mLs Intravenous New Bag/Given 06/29/22 0542)  potassium chloride SA (KLOR-CON M) CR tablet 40 mEq (40 mEq Oral Given 06/29/22 0550)     Procedures  /  Critical Care Procedures  ED Course and Medical Decision Making  I have reviewed the triage vital signs, the nursing notes, and pertinent available records from the EMR.  Social Determinants Affecting Complexity of Care: Patient has no clinically significant social determinants affecting this chief complaint..   ED Course:    Medical Decision Making Patient here with fevers, body aches.  Noted to be tachycardic.  Had Reclast infusion yesterday.  States that she is having side effects from the infusion.  Was told she'd have several days to a couple of weeks of fevers, chills, body aches.  Was tachycardic, but this is improved with fluids.    K is 3.1, given supplemental K.    Problems Addressed: Adverse effect of drug, initial encounter: acute illness or injury with systemic symptoms Fever,  unspecified fever cause: acute illness or injury Tachycardia: acute illness or injury  Amount and/or Complexity of Data Reviewed Labs: ordered.    Details: K is 3.1, will replace. Trop 7 Covid, flu, RSV negative. ECG/medicine tests: ordered and independent interpretation performed.    Details: Sinus tachycardia  Risk OTC drugs. Prescription drug management.     Consultants: No consultations were needed in caring for this patient.   Treatment and Plan: Emergency department workup does not suggest an emergent condition requiring admission or immediate intervention beyond  what has been performed at this time. The patient is safe for discharge and has  been  instructed to return immediately for worsening symptoms, change in  symptoms or any other concerns    Final Clinical Impressions(s) / ED Diagnoses     ICD-10-CM   1. Adverse effect of drug, initial encounter  T50.905A     2. Tachycardia  R00.0     3. Fever, unspecified fever cause  R50.9       ED Discharge Orders     None         Discharge Instructions Discussed with and Provided to Patient:     Discharge Instructions      Continue taking Tylenol and drinking fluids.  Return if symptoms change or worsen.       Montine Circle, PA-C 06/29/22 1694    Merrily Pew, MD 06/30/22 [id number]

## 2022-06-29 NOTE — Telephone Encounter (Signed)
I spoke with pt and last temp was 103. P now 95. Pt said she just ate a popsicle to see if that would bring fever down. Pt said she is not having CP or SOB. Pt wants to see if she will get better and pt said she  understands that after reclast infusion that pt received on 06/28/22 that was ordered by Dr Cruzita Lederer that that could cause these type symptoms.  Pt has mycharted Dr Cruzita Lederer but I advised pt no available appts at [facility] today and per ED note if pt condition changed or worsened to go back to ED. UC and ED precautions given and pt voiced understanding.Pt said she was going to wait to hear from Dr Cruzita Lederer first and I did warm transfer to Dr Cruzita Lederer office for pt to speak with them. I spoke with Talesha at [phone] and advised above and then transferred pt. Pt will cb after resolves present symptoms to get PCP straightened out; pt does not know who Dr Vaughan Browner is and pt said Dr Diona Browner is her PCP. Sending note to Dr Diona Browner and [place] pool.

## 2022-06-29 NOTE — Telephone Encounter (Signed)
Patient called in and stated she was seen in the hospital yesterday for her osteoporosis. She stated that her heart rate was high and she had a fever. They let her go home once everything went down, but today she has a fever of 104 and not sure what to do. Sent over to access nurse.

## 2022-06-29 NOTE — Telephone Encounter (Signed)
Called and spoke to patient. She wanted to make Dr. Patsey Berthold aware that she had to cancel appointment today due to reaction to reclast infusion. She reports of high fever of 104, shakes and rapid heart rate. She will call back to reschedule.   Routing to Dr. Patsey Berthold as an Juluis Rainier.

## 2022-06-29 NOTE — Telephone Encounter (Signed)
Pt contacted the office and advised she had her infusion yesterday morning, went out to eat after and by afternoon she was experiencing flu like symptoms. Fever chills and muscle aches. Pt fever was 104 and she went to the ED. They sent her home with a fever of 99.4 and advised her to return if symptoms worsen. Pt states her fever is creeping back up but no chills or aches at this time. Pt was advised by the nurse to return if fever does not break.

## 2022-06-29 NOTE — Telephone Encounter (Signed)
Patient is returning phone call. Patient phone number is [phone].

## 2022-06-30 NOTE — Assessment & Plan Note (Signed)
Stable control on diltiazem S SR 120 mg BID,

## 2022-06-30 NOTE — Assessment & Plan Note (Signed)
Noted incidentally on MRI brain.  Normal vitamin D, PTH and calcium levels.

## 2022-06-30 NOTE — Assessment & Plan Note (Addendum)
Encouraged exercise, weight loss, healthy eating habits. Discussed possible initiation of GLP-1 medication such as Wegovy.

## 2022-06-30 NOTE — Assessment & Plan Note (Signed)
In context of steroid administration no recurrent A-fib noted on cardiac monitor. No clear indication per cardiology for long-term anticoagulation as it is likely a lone event.  Patient no longer on steroids.

## 2022-06-30 NOTE — Assessment & Plan Note (Signed)
Chronic, not at goal... off statin. Patient wishes to restart working on lifestyle change prior to restarting statin medication.  I discussed with her the importance of improved cholesterol control to decrease the risk of heart disease and stroke.

## 2022-06-30 NOTE — Assessment & Plan Note (Signed)
SE actonel.

## 2022-07-02 ENCOUNTER — Ambulatory Visit: Payer: PPO | Attending: Family Medicine | Admitting: Occupational Therapy

## 2022-07-02 DIAGNOSIS — I890 Lymphedema, not elsewhere classified: Secondary | ICD-10-CM | POA: Diagnosis not present

## 2022-07-02 NOTE — Therapy (Incomplete)
OUTPATIENT OCCUPATIONAL THERAPY EVALUATION  LOWER EXTREMITY LYMPHEDEMA  Patient Name: Kimberly Patton MRN: [id number] DOB:05/06/1956, 67 y.o., female Today's Date: 07/02/2022  END OF SESSION:  OT End of Session - 07/02/22 0822     Visit Number 1    Number of Visits 36    Date for OT Re-Evaluation 09/30/22    OT Start Time 0811    Activity Tolerance Patient tolerated treatment well;No increased pain    Behavior During Therapy [facility] for tasks assessed/performed               Past Medical History:  Diagnosis Date   ALLERGIC RHINITIS 04/03/2007   Allergy    Not sure   Anemia    ((Pt Qnr Sub: Denies at visit from 08/28/2021 with endocrinology))    Anginal pain ([place])    Anxiety    Arthritis Not sure   Asthma    hx of years ago - no longer a problem    ASTHMA, PERSISTENT, MODERATE 04/03/2007   Cancer ([place])    skin    Dyspnea on exertion    a. 11/2007 Echo: EF 60%.   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 11/2011 48h Holter: RSR, rare PVC's, occas PAC's   Pre-diabetes    Retinal tear of right eye 01/2016   Thyroid nodule 04/15/2018   Ventral hernia    Past Surgical History:  Procedure Laterality Date   [place]   ARTERY BIOPSY Left 03/21/2022   Procedure: BIOPSY TEMPORAL ARTERY;  Surgeon: Katha Cabal, MD;  Location: [facility] ORS;  Service: Vascular;  Laterality: Left;   BIOPSY THYROID     08/2021   BREAST CYST EXCISION Right 1994   Benign   CESAREAN SECTION     x2   CHOLECYSTECTOMY  1995   COLONOSCOPY WITH PROPOFOL N/A 05/09/2021    Procedure: COLONOSCOPY WITH PROPOFOL;  Surgeon: [name], MD;  Location: [facility] ENDOSCOPY;  Service: Gastroenterology;  Laterality: N/A;   EYE SURGERY  01/2016   Repair retinal tear   FOOT SURGERY  1995 / 1996   x2   HERNIA REPAIR     KNEE ARTHROSCOPY W/ MENISCAL REPAIR  07/25/2013   right knee Dr. Mardelle Matte   LAPAROSCOPIC GASTRIC SLEEVE RESECTION N/A 06/08/2013   Procedure: LAPAROSCOPIC GASTRIC SLEEVE RESECTION AND EXCISION OF SEBACEUS CYST FROM MID CHEST takedown of incarcerated ventral hernia and primary repair endoscopy;  Surgeon: [name], MD;  Location: [facility] ORS;  Service: General;  Laterality: N/A;   LAPAROSCOPIC NISSEN FUNDOPLICATION N/A 75/91/6384   Procedure: LAPAROSCOPIC REPAIR LARGE SYMPTOMATIC HIATAL HERNIA WITH UPPER ENDOSCOPY;  Surgeon: Johnathan Hausen, MD;  Location: [facility] ORS;  Service: General;  Laterality: N/A;   moles  06/2013   removed 2 moles from under arm and  lowback   NECK SURGERY     occipital nerve damage- injections    OVARIAN CYST [place]  GUIDED EXCISIONAL BREAST BIOPSY Right 06/23/2021   Procedure: RADIOACTIVE SEED GUIDED EXCISIONAL RIGHT BREAST BIOPSY;  Surgeon: Johnathan Hausen, MD;  Location: [facility];  Service: General;  Laterality: Right;   RETINAL TEAR REPAIR CRYOTHERAPY Right 01/2016   [place]   SKIN CANCER EXCISION     TONSILLECTOMY     TUBAL LIGATION  Think 1994   UPPER GI ENDOSCOPY  06/08/2013   Procedure: UPPER GI ENDOSCOPY;  Surgeon: [name], MD;  Location: [facility] ORS;  Service: General;;   US ECHOCARDIOGRAPHY  12/2011   WNL - EF 55-60%, mild MR, grade 1 diastolic dysnfiction (mild)   VENTRAL HERNIA REPAIR  2000   Patient Active Problem List   Diagnosis Date Noted   COVID-19 virus infection 04/30/2022   Paroxysmal atrial fibrillation ([place]) 03/28/2022   Empty sella syndrome ([place]) 03/28/2022   Varicose veins with inflammation 01/07/2022   S/P breast lumpectomy  10/10/2021   Cervical radiculopathy 06/28/2020   Chronic venous insufficiency 05/29/2020   Lymphedema 05/29/2020   CAD (coronary artery disease) 05/29/2020   Cervical lymphadenitis 04/18/2020   History of repair of hiatal hernia 02/02/2020   Orthostatic hypotension 01/01/2020   Hypercholesteremia 06/09/2019   Chronic neuropathic pain 07/31/2018   Thyroid nodule 04/15/2018   Skin lesion of scalp 09/06/2017   Age-related osteoporosis without current pathological fracture 12/04/2016   Osteoarthritis of knee 07/17/2016   Meningioma ([place]) 01/18/2016   Family history of aortic stenosis 01/20/2015   Status post laparoscopic sleeve gastrectomy Dec 2014 06/08/2013   Morbid obesity ([place]) 12/08/2012   Primary hypertension 10/11/2011   Varicose veins of bilateral lower extremities with other complications 41/32/4401   FIBROIDS, UTERUS 04/03/2007   Asthma, mild intermittent 04/03/2007    PCP: Camillia Herter, MD  REFERRING PROVIDER: [name], MD  REFERRING DIAG: I89.0  THERAPY DIAG:  Lymphedema, not elsewhere classified  Rationale for Evaluation and Treatment: Rehabilitation  ONSET DATE: ***  SUBJECTIVE:                                                                                                                                                                                           SUBJECTIVE STATEMENT:  PERTINENT HISTORY:   PAIN:  Are you having pain? Yes: NPRS scale: 6/10 Pain location: L>R Pain description: heavy legs, sore tight, tired Aggravating factors: standing, walking, dependent sitting Relieving factors: elevation, compression stockings  PRECAUTIONS: {Therapy precautions:24002}  WEIGHT BEARING RESTRICTIONS: No  FALLS:  Has patient fallen in last 6 months? 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  LEISURE: cards  HAND DOMINANCE: right   PRIOR LEVEL OF FUNCTION: Independent  PATIENT GOALS: Learn how to deal w lymphedema; get swelling down, "I just dont want to end up like my sister."   OBJECTIVE:  COGNITION:  Overall cognitive status: Within functional limits for tasks assessed   PALPATION: ***  OBSERVATIONS / OTHER ASSESSMENTS: ***  SENSATION: Light touch: Impaired    Neuropathy-feet : POSTURE: WNL  LE ROM:   LE STRENGTH  LYMPHEDEMA ASSESSMENTS:    Mild, Stage  II, Bilateral Lower Extremity Lymphedema 2/2 CVI and Obesity  Skin  Description Hyper-Keratosis Peau' de Orange Shiny Tight Fibrotic/ Indurated Fatty Doughy Spongy/ boggy       R>L x  x   Skin dry Flaky WNL Macerated   mildly      Color Redness Varicosities Blanching Hemosiderin Stain Mottled   x     x   Odor Malodorous Yeast Fungal infection  WNL      x   Temperature Warm Cool wnl    x     Pitting Edema   1+ 2+ 3+ 4+ Non-pitting         x   Girth Symmetrical Asymmetrical                   Distribution    R>L toes to groin    Stemmer Sign Positive Negative   +    Lymphorrhea History Of:  Present Absent     x    Wounds History Of Present Absent Venous Arterial Pressure Sheer     x        Signs of Infection Redness [place] Erythema Acute Swelling Drainage Borders                    Sensation Light Touch Deep pressure Hypersensitivty   Present Impaired Present Impaired Absent Impaired   x Tactile  x  x     Nails WNL   Fungus nail dystrophy   x     Hair Growth Symmetrical Asymmetrical   x    Skin Creases Base of toes  Ankles   Base of Fingers knees       Abdominal pannus Thigh Lobules  Face/neck   x x  x      BLE COMPARATIVE LIMB VOLUMETRICS  LANDMARK RIGHT  01/31/22  R LEG (A-D) N/A  R THIGH (E-G) ml  R FULL LIMB (A-G) ml  Limb Volume differential (LVD)  %  Volume change since initial %  Volume change overall V  (Blank rows = not  tested)  LANDMARK LEFT  01/31/22  R LEG (A-D) N/A  R THIGH (E-G) ml  R FULL LIMB (A-G) ml  Limb Volume differential (LVD)  %  Volume change since initial %  Volume change overall %  (Blank rows = not tested)  LYMPHEDEMA LIFE IMPACT SCALE (LLIS):  FOTO outcome measure:   I. Physical Concerns The amount of pain associated with my lymphedema is: {LLISSELECTION:27229} The amount of limb heaviness associated with my lymphedema is: {LLISSELECTION:27229} The amount of skin tightness associated with my lymphedema is: {LLISSELECTION:27229} In comparison to my unaffected limb, the size of my swollen limb seems: {LLISSELECTION:27229} In comparison to my unaffected limb, the texture of my swollen limb feels: {LLISSELECTION:27229} Lymphedema affects movement of my swollen limb: {LLISSELECTION:27229} The strength in my swollen limb compared with the unaffected limb is: {LLISSELECTION:27229} How often have you become ill  wit an infection in your swollen limb requiring oral antibiotics or hospitalization in the past 2 YEARS? {LLISSELECTION:27229}  II. Psychosocial Concerns 9.   Lymphedema affects my body image (ie. "How I think I look"): {LLISSELECTION:27229} 10. Lymphedema affects my socializing with others: {LLISSELECTION:27229} 11. Lymphedema affects my intimate relations: {LLISSELECTION:27229} 12. Lymphedema "gets me down" (ie. I have feelings of depression, frustration, or anger due to the lymphedema): {LLISSELECTION:27229}  III. Functional Concerns 13. Lymphedema affects my ability to perform duties at home: {LLISSELECTION:27229} 14. Lymphedema affects my ability to perform dueites at work (if applicable): {NGEXBMWUXLKGM:01027} 15. Lymphedema affects my performance of preferred recreational activities: {LLISSELECTION:27229} 16. Lymphedema affects the proper fit of clothing/shoes: {LLISSELECTION:27229} 17. Lymphedema affects my sleep: {LLISSELECTION:27229} 18. I must rely on others for help  due to my lymphedema: {LLISSELECTION:27229}  TODAY'S TREATMENT:                                                                                                                                         DATE: ***  PATIENT EDUCATION:  Education details: *** Person educated: {Person educated:25204} Education method: {Education Method:25205} Education comprehension: {Education Comprehension:25206}  HOME EXERCISE PROGRAM: ***  ASSESSMENT:  CLINICAL IMPRESSION: Patient is a *** y.o. *** who was seen today for physical therapy evaluation and treatment for ***.   OBJECTIVE IMPAIRMENTS: {opptimpairments:25111}.   ACTIVITY LIMITATIONS: {activitylimitations:27494}  PARTICIPATION LIMITATIONS: {participationrestrictions:25113}  PERSONAL FACTORS: {Personal factors:25162} are also affecting patient's functional outcome.   REHAB POTENTIAL: {rehabpotential:25112}  CLINICAL DECISION MAKING: {clinical decision making:25114}  EVALUATION COMPLEXITY: {Evaluation complexity:25115}   GOALS: Goals reviewed with patient? {yes/no:20286}  SHORT TERM GOALS: Target date: ***  *** Baseline: Goal status: {GOALSTATUS:25110}  2.  *** Baseline:  Goal status: {GOALSTATUS:25110}  3.  *** Baseline:  Goal status: {GOALSTATUS:25110}  4.  *** Baseline:  Goal status: {GOALSTATUS:25110}  5.  *** Baseline:  Goal status: {GOALSTATUS:25110}  6.  *** Baseline:  Goal status: {GOALSTATUS:25110}  LONG TERM GOALS: Target date: ***  *** Baseline:  Goal status: {GOALSTATUS:25110}  2.  *** Baseline:  Goal status: {GOALSTATUS:25110}  3.  *** Baseline:  Goal status: {GOALSTATUS:25110}  4.  *** Baseline:  Goal status: {GOALSTATUS:25110}  5.  *** Baseline:  Goal status: {GOALSTATUS:25110}  6.  *** Baseline:  Goal status: {GOALSTATUS:25110}   PLAN:  PT FREQUENCY: {rehab frequency:25116}  PT DURATION: {rehab duration:25117}  PLANNED INTERVENTIONS: {rehab planned  interventions:25118::"Therapeutic exercises","Therapeutic activity","Neuromuscular re-education","Balance training","Gait training","Patient/Family education","Self Care","Joint mobilization"}  PLAN FOR NEXT SESSION: ***   Ansel Bong, OT 07/02/2022, 8:30 AM

## 2022-07-03 DIAGNOSIS — M25461 Effusion, right knee: Secondary | ICD-10-CM | POA: Diagnosis not present

## 2022-07-04 ENCOUNTER — Ambulatory Visit
Admission: RE | Admit: 2022-07-04 | Discharge: 2022-07-04 | Disposition: A | Discharge status: Home / Self Care | Payer: PPO | Source: Ambulatory Visit | Attending: Gastroenterology | Admitting: Gastroenterology

## 2022-07-04 ENCOUNTER — Telehealth: Payer: Self-pay

## 2022-07-04 DIAGNOSIS — R1310 Dysphagia, unspecified: Secondary | ICD-10-CM | POA: Diagnosis not present

## 2022-07-04 DIAGNOSIS — Z9889 Other specified postprocedural states: Secondary | ICD-10-CM | POA: Diagnosis not present

## 2022-07-04 DIAGNOSIS — R09A2 Foreign body sensation, throat: Secondary | ICD-10-CM | POA: Insufficient documentation

## 2022-07-04 DIAGNOSIS — K219 Gastro-esophageal reflux disease without esophagitis: Secondary | ICD-10-CM | POA: Insufficient documentation

## 2022-07-04 DIAGNOSIS — K224 Dyskinesia of esophagus: Secondary | ICD-10-CM | POA: Diagnosis not present

## 2022-07-04 NOTE — Telephone Encounter (Signed)
     Patient  visit on 06/29/2022  at [facility] was for Adverse effect of drug, initial encounter.  Have you been able to follow up with your primary care physician? Yes  The patient was or was not able to obtain any needed medicine or equipment. No medication prescribed.  Are there diet recommendations that you are having difficulty following? No  Patient expresses understanding of discharge instructions and education provided has no other needs at this time.    [place] Resource Care Guide   [email or web address]  ?? [phone]   Website: [email or web address]  [email or web address]

## 2022-07-05 ENCOUNTER — Ambulatory Visit: Payer: PPO | Admitting: Occupational Therapy

## 2022-07-05 NOTE — Therapy (Deleted)
OUTPATIENT PHYSICAL THERAPY LOWER EXTREMITY LYMPHEDEMA EVALUATION  Patient Name: Kimberly Patton MRN: [id number] DOB:August 02, 1955, 67 y.o., female Today's Date: 07/05/2022  END OF SESSION:   Past Medical History:  Diagnosis Date   ALLERGIC RHINITIS 04/03/2007   Allergy    Not sure   Anemia    ((Pt Qnr Sub: Denies at visit from 08/28/2021 with endocrinology))    Anginal pain ([place])    Anxiety    Arthritis Not sure   Asthma    hx of years ago - no longer a problem    ASTHMA, PERSISTENT, MODERATE 04/03/2007   Cancer ([place])    skin    Dyspnea on exertion    a. 11/2007 Echo: EF 60%.   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 11/2011 48h Holter: RSR, rare PVC's, occas PAC's   Pre-diabetes    Retinal tear of right eye 01/2016   Thyroid nodule 04/15/2018   Ventral hernia    Past Surgical History:  Procedure Laterality Date   [place]   ARTERY BIOPSY Left 03/21/2022   Procedure: BIOPSY TEMPORAL ARTERY;  Surgeon: Katha Cabal, MD;  Location: [facility] ORS;  Service: Vascular;  Laterality: Left;   BIOPSY THYROID     08/2021   BREAST CYST EXCISION Right 1994   Benign   CESAREAN SECTION     x2   CHOLECYSTECTOMY  1995   COLONOSCOPY WITH PROPOFOL N/A 05/09/2021   Procedure: COLONOSCOPY WITH PROPOFOL;  Surgeon: [name], MD;  Location: [facility] ENDOSCOPY;  Service: Gastroenterology;  Laterality: N/A;   EYE SURGERY  01/2016   Repair retinal tear   FOOT SURGERY  1995 / 1996   x2   HERNIA REPAIR     KNEE ARTHROSCOPY W/ MENISCAL REPAIR  07/25/2013   right knee Dr.  Mardelle Matte   LAPAROSCOPIC GASTRIC SLEEVE RESECTION N/A 06/08/2013   Procedure: LAPAROSCOPIC GASTRIC SLEEVE RESECTION AND EXCISION OF SEBACEUS CYST FROM MID CHEST takedown of incarcerated ventral hernia and primary repair endoscopy;  Surgeon: [name], MD;  Location: [facility] ORS;  Service: General;  Laterality: N/A;   LAPAROSCOPIC NISSEN FUNDOPLICATION N/A 02/63/7858   Procedure: LAPAROSCOPIC REPAIR LARGE SYMPTOMATIC HIATAL HERNIA WITH UPPER ENDOSCOPY;  Surgeon: Johnathan Hausen, MD;  Location: [facility] ORS;  Service: General;  Laterality: N/A;   moles  06/2013   removed 2 moles from under arm and  lowback   NECK SURGERY     occipital nerve damage- injections    OVARIAN CYST [place] EXCISIONAL BREAST BIOPSY Right 06/23/2021   Procedure: RADIOACTIVE SEED GUIDED EXCISIONAL RIGHT BREAST BIOPSY;  Surgeon: Johnathan Hausen, MD;  Location: [facility];  Service: General;  Laterality: Right;   RETINAL TEAR REPAIR CRYOTHERAPY Right 01/2016   [place]   SKIN CANCER EXCISION     TONSILLECTOMY     TUBAL LIGATION  Think 1994   UPPER GI ENDOSCOPY  06/08/2013  Procedure: UPPER GI ENDOSCOPY;  Surgeon: [name], MD;  Location: [facility];  Service: General;;   US ECHOCARDIOGRAPHY  12/2011   WNL - EF 55-60%, mild MR, grade 1 diastolic dysnfiction (mild)   VENTRAL HERNIA REPAIR  2000   Patient Active Problem List   Diagnosis Date Noted   COVID-19 virus infection 04/30/2022   Paroxysmal atrial fibrillation ([place]) 03/28/2022   Empty sella syndrome ([place]) 03/28/2022   Varicose veins with inflammation 01/07/2022   S/P breast lumpectomy 10/10/2021   Cervical radiculopathy 06/28/2020   Chronic venous insufficiency 05/29/2020   Lymphedema 05/29/2020   CAD (coronary artery disease) 05/29/2020   Cervical lymphadenitis 04/18/2020   History of repair of hiatal hernia 02/02/2020   Orthostatic hypotension 01/01/2020   Hypercholesteremia  06/09/2019   Chronic neuropathic pain 07/31/2018   Thyroid nodule 04/15/2018   Skin lesion of scalp 09/06/2017   Age-related osteoporosis without current pathological fracture 12/04/2016   Osteoarthritis of knee 07/17/2016   Meningioma ([place]) 01/18/2016   Family history of aortic stenosis 01/20/2015   Status post laparoscopic sleeve gastrectomy Dec 2014 06/08/2013   Morbid obesity ([place]) 12/08/2012   Primary hypertension 10/11/2011   Varicose veins of bilateral lower extremities with other complications 49/67/5916   FIBROIDS, UTERUS 04/03/2007   Asthma, mild intermittent 04/03/2007    PCP: ***  REFERRING PROVIDER: ***  REFERRING DIAG: ***  THERAPY DIAG:  Lymphedema, not elsewhere classified - Plan: Ot plan of care cert/re-cert  Rationale for Evaluation and Treatment: {HABREHAB:27488}  ONSET DATE: ***  SUBJECTIVE:                                                                                                                                                                                           SUBJECTIVE STATEMENT:  PERTINENT HISTORY:   PAIN:  Are you having pain? {OPRCPAIN:27236}  PRECAUTIONS: {Therapy precautions:24002}  WEIGHT BEARING RESTRICTIONS: {Yes ***/No:24003}  FALLS:  Has patient fallen in last 6 months? {fallsyesno:27318}  LIVING ENVIRONMENT: Lives with: {OPRC lives with:25569::"lives with their family"} Lives in: {Lives in:25570} Stairs: {yes/no:20286}; {Stairs:24000} Has following equipment at home: {Assistive devices:23999}  OCCUPATION: ***  LEISURE: ***  HAND DOMINANCE: {RIGHT/LEFT:21944}   PRIOR LEVEL OF FUNCTION: {PLOF:24004}  PATIENT GOALS: ***   OBJECTIVE:  COGNITION:  Overall cognitive status: {cognition:24006}   PALPATION: ***  OBSERVATIONS / OTHER ASSESSMENTS: ***  SENSATION: {sensation:27233}  POSTURE: ***  LE ROM:   {AROM/PROM:27142}  Right 07/05/2022 LEFT 07/05/2022  Hip flexion    Hip extension    Hip  abduction    Hip adduction    Hip internal rotation    Hip external rotation  Knee flexion    Knee extension    Ankle dorsiflexion    Ankle plantarflexion    Ankle inversion    Ankle eversion     (Blank rows = not tested)  LE MMT:   MMT Right 07/05/2022 Left 07/05/2022  Hip flexion    Hip extension    Hip abduction    Hip adduction    Hip internal rotation    Hip external rotation    Knee flexion    Knee extension    Ankle dorsiflexion    Ankle plantarflexion    Ankle inversion    Ankle eversion     (Blank rows = not tested)  LYMPHEDEMA ASSESSMENTS:   SURGERY TYPE/DATE: ***  NUMBER OF LYMPH NODES REMOVED: ***  CHEMOTHERAPY: ***  RADIATION:***  HORMONE TREATMENT: ***  INFECTIONS: ***   LYMPHEDEMA ASSESSMENTS:   LANDMARK RIGHT  07/05/2022  10 cm proximal to olecranon process   Olecranon process   10 cm proximal to ulnar styloid process   Just proximal to ulnar styloid process   Across hand at thumb web space   At base of 2nd digit   (Blank rows = not tested)  LANDMARK LEFT  07/05/2022  10 cm proximal to olecranon process   Olecranon process   10 cm proximal to ulnar styloid process   Just proximal to ulnar styloid process   Across hand at thumb web space   At base of 2nd digit   (Blank rows = not tested)   LE LANDMARK RIGHT 07/05/2022  At groin   30 cm proximal to suprapatella   20 cm proximal to suprapatella   10 cm proximal to suprapatella   At midpatella / popliteal crease   30 cm proximal to floor at lateral plantar foot   20 cm proximal to floor at lateral plantar foot   10 cm proximal to floor at lateral plantar foot   Circumference of ankle/heel   5 cm proximal to 1st MTP joint   Across MTP joint   Around proximal great toe   (Blank rows = not tested)  LE LANDMARK LEFT 07/05/2022  At groin   30 cm proximal to suprapatella   20 cm proximal to suprapatella   10 cm proximal to suprapatella   At midpatella / popliteal crease   30  cm proximal to floor at lateral plantar foot   20 cm proximal to floor at lateral plantar foot   10 cm proximal to floor at lateral plantar foot   Circumference of ankle/heel   5 cm proximal to 1st MTP joint   Across MTP joint   Around proximal great toe   (Blank rows = not tested)  FUNCTIONAL TESTS:  {Functional tests:24029}  GAIT: Distance walked: *** Assistive device utilized: {Assistive devices:23999} Level of assistance: {Levels of assistance:24026} Comments: ***  LYMPHEDEMA LIFE IMPACT SCALE (LLIS):  I. Physical Concerns The amount of pain associated with my lymphedema is: {LLISSELECTION:27229} The amount of limb heaviness associated with my lymphedema is: {LLISSELECTION:27229} The amount of skin tightness associated with my lymphedema is: {LLISSELECTION:27229} In comparison to my unaffected limb, the size of my swollen limb seems: {LLISSELECTION:27229} In comparison to my unaffected limb, the texture of my swollen limb feels: {LLISSELECTION:27229} Lymphedema affects movement of my swollen limb: {LLISSELECTION:27229} The strength in my swollen limb compared with the unaffected limb is: {LLISSELECTION:27229} How often have you become ill wit an infection in your swollen limb requiring oral antibiotics or hospitalization in the past 2 YEARS? {LLISSELECTION:27229}  II. Psychosocial Concerns 9.   Lymphedema affects my body image (ie. "How I think I look"): {LLISSELECTION:27229} 10. Lymphedema affects my socializing with others: {LLISSELECTION:27229} 11. Lymphedema affects my intimate relations: {LLISSELECTION:27229} 12. Lymphedema "gets me down" (ie. I have feelings of depression, frustration, or anger due to the lymphedema): {LLISSELECTION:27229}  III. Functional Concerns 13. Lymphedema affects my ability to perform duties at home: {LLISSELECTION:27229} 14. Lymphedema affects my ability to perform dueites at work (if applicable): {HWYSHUOHFGBMS:11155} 15. Lymphedema affects  my performance of preferred recreational activities: {LLISSELECTION:27229} 16. Lymphedema affects the proper fit of clothing/shoes: {LLISSELECTION:27229} 17. Lymphedema affects my sleep: {LLISSELECTION:27229} 18. I must rely on others for help due to my lymphedema: {LLISSELECTION:27229}  TODAY'S TREATMENT:                                                                                                                                         DATE: ***  PATIENT EDUCATION:  Education details: *** Person educated: {Person educated:25204} Education method: {Education Method:25205} Education comprehension: {Education Comprehension:25206}  HOME EXERCISE PROGRAM: ***  ASSESSMENT:  CLINICAL IMPRESSION: Patient is a *** y.o. *** who was seen today for physical therapy evaluation and treatment for ***.   OBJECTIVE IMPAIRMENTS: {opptimpairments:25111}.   ACTIVITY LIMITATIONS: {activitylimitations:27494}  PARTICIPATION LIMITATIONS: {participationrestrictions:25113}  PERSONAL FACTORS: {Personal factors:25162} are also affecting patient's functional outcome.   REHAB POTENTIAL: {rehabpotential:25112}  CLINICAL DECISION MAKING: {clinical decision making:25114}  EVALUATION COMPLEXITY: {Evaluation complexity:25115}   GOALS: Goals reviewed with patient? {yes/no:20286}  SHORT TERM GOALS: Target date: ***  *** Baseline: Goal status: {GOALSTATUS:25110}  2.  *** Baseline:  Goal status: {GOALSTATUS:25110}  3.  *** Baseline:  Goal status: {GOALSTATUS:25110}  4.  *** Baseline:  Goal status: {GOALSTATUS:25110}  5.  *** Baseline:  Goal status: {GOALSTATUS:25110}  6.  *** Baseline:  Goal status: {GOALSTATUS:25110}  LONG TERM GOALS: Target date: ***  *** Baseline:  Goal status: {GOALSTATUS:25110}  2.  *** Baseline:  Goal status: {GOALSTATUS:25110}  3.  *** Baseline:  Goal status: {GOALSTATUS:25110}  4.  *** Baseline:  Goal status: {GOALSTATUS:25110}  5.   *** Baseline:  Goal status: {GOALSTATUS:25110}  6.  *** Baseline:  Goal status: {GOALSTATUS:25110}   PLAN:  PT FREQUENCY: {rehab frequency:25116}  PT DURATION: {rehab duration:25117}  PLANNED INTERVENTIONS: {rehab planned interventions:25118::"Therapeutic exercises","Therapeutic activity","Neuromuscular re-education","Balance training","Gait training","Patient/Family education","Self Care","Joint mobilization"}  PLAN FOR NEXT SESSION: ***   Ansel Bong, OT 07/05/2022, 4:04 PM

## 2022-07-06 DIAGNOSIS — M546 Pain in thoracic spine: Secondary | ICD-10-CM | POA: Diagnosis not present

## 2022-07-09 ENCOUNTER — Ambulatory Visit: Payer: PPO | Admitting: Occupational Therapy

## 2022-07-09 ENCOUNTER — Encounter: Payer: Self-pay | Admitting: Cardiology

## 2022-07-09 ENCOUNTER — Encounter: Payer: Self-pay | Admitting: Occupational Therapy

## 2022-07-09 DIAGNOSIS — I890 Lymphedema, not elsewhere classified: Secondary | ICD-10-CM

## 2022-07-09 NOTE — Therapy (Signed)
OUTPATIENT OCCUPATIONAL THERAPY LOWER EXTREMITY LYMPHEDEMA EVALUATION  Patient Name: Kimberly Patton MRN: [id number] DOB:03/13/1956, 67 y.o., female Today's Date: 07/09/2022  END OF SESSION:  OT End of Session - 07/09/22 1305     Visit Number 1    Number of Visits 36    Date for OT Re-Evaluation 09/30/22    OT Start Time 0810    OT Stop Time 0910    OT Time Calculation (min) 60 min    Activity Tolerance Patient tolerated treatment well;No increased pain    Behavior During Therapy [facility] for tasks assessed/performed               Past Medical History:  Diagnosis Date   ALLERGIC RHINITIS 04/03/2007   Allergy    Not sure   Anemia    ((Pt Qnr Sub: Denies at visit from 08/28/2021 with endocrinology))    Anginal pain ([place])    Anxiety    Arthritis Not sure   Asthma    hx of years ago - no longer a problem    ASTHMA, PERSISTENT, MODERATE 04/03/2007   Cancer ([place])    skin    Dyspnea on exertion    a. 11/2007 Echo: EF 60%.   Dysrhythmia    hx of heart arrhythmia 10-15 years ago - followed by DR Lovena Le - not seen in years    GERD (gastroesophageal reflux disease)    Hemophilia carrier    Hypercholesteremia 06/09/2019   Hypertension    Joint pain    HIPS / LEGS   Knee injury    RT   Meningioma ([place])    Morbid obesity ([place])    Neuromuscular disorder ([place]) Not sure, since my fall   Paroxysmal SVT (supraventricular tachycardia)    a. 11/2011 48h Holter: RSR, rare PVC's, occas PAC's   Pre-diabetes    Retinal tear of right eye 01/2016   Thyroid nodule 04/15/2018   Ventral hernia    Past Surgical History:  Procedure Laterality Date   [place]   ARTERY BIOPSY Left 03/21/2022   Procedure: BIOPSY TEMPORAL ARTERY;  Surgeon: Katha Cabal, MD;  Location: [facility] ORS;  Service: Vascular;  Laterality: Left;   BIOPSY THYROID     08/2021   BREAST CYST EXCISION Right 1994   Benign   CESAREAN SECTION     x2   CHOLECYSTECTOMY   1995   COLONOSCOPY WITH PROPOFOL N/A 05/09/2021   Procedure: COLONOSCOPY WITH PROPOFOL;  Surgeon: [name], MD;  Location: [facility] ENDOSCOPY;  Service: Gastroenterology;  Laterality: N/A;   EYE SURGERY  01/2016   Repair retinal tear   FOOT SURGERY  1995 / 1996   x2   HERNIA REPAIR     KNEE ARTHROSCOPY W/ MENISCAL REPAIR  07/25/2013   right knee Dr. Mardelle Matte   LAPAROSCOPIC GASTRIC SLEEVE RESECTION N/A 06/08/2013   Procedure: LAPAROSCOPIC GASTRIC SLEEVE RESECTION AND EXCISION OF SEBACEUS CYST FROM MID CHEST takedown of incarcerated ventral hernia and primary repair endoscopy;  Surgeon: [name], MD;  Location: [facility] ORS;  Service: General;  Laterality: N/A;   LAPAROSCOPIC NISSEN FUNDOPLICATION N/A 73/71/0626   Procedure: LAPAROSCOPIC REPAIR LARGE SYMPTOMATIC HIATAL HERNIA WITH UPPER ENDOSCOPY;  Surgeon: Johnathan Hausen, MD;  Location: [facility] ORS;  Service: General;  Laterality: N/A;   moles  06/2013   removed 2 moles from under arm and  lowback   NECK SURGERY     occipital nerve damage- injections    OVARIAN  CYST REMOVAL  1970   PILONIDAL CYST EXCISION  1975   RADIOACTIVE SEED GUIDED EXCISIONAL BREAST BIOPSY Right 06/23/2021   Procedure: RADIOACTIVE SEED GUIDED EXCISIONAL RIGHT BREAST BIOPSY;  Surgeon: Johnathan Hausen, MD;  Location: [place];  Service: General;  Laterality: Right;   RETINAL TEAR REPAIR CRYOTHERAPY Right 01/2016   [place]   SKIN CANCER EXCISION     TONSILLECTOMY     TUBAL LIGATION  Think 1994   UPPER GI ENDOSCOPY  06/08/2013   Procedure: UPPER GI ENDOSCOPY;  Surgeon: [name], MD;  Location: [facility] ORS;  Service: General;;   US ECHOCARDIOGRAPHY  12/2011   WNL - EF 55-60%, mild MR, grade 1 diastolic dysnfiction (mild)   VENTRAL HERNIA REPAIR  2000   Patient Active Problem List   Diagnosis Date Noted   COVID-19 virus infection 04/30/2022   Paroxysmal atrial fibrillation ([place]) 03/28/2022   Empty sella syndrome ([place]) 03/28/2022   Varicose veins with  inflammation 01/07/2022   S/P breast lumpectomy 10/10/2021   Cervical radiculopathy 06/28/2020   Chronic venous insufficiency 05/29/2020   Lymphedema 05/29/2020   CAD (coronary artery disease) 05/29/2020   Cervical lymphadenitis 04/18/2020   History of repair of hiatal hernia 02/02/2020   Orthostatic hypotension 01/01/2020   Hypercholesteremia 06/09/2019   Chronic neuropathic pain 07/31/2018   Thyroid nodule 04/15/2018   Skin lesion of scalp 09/06/2017   Age-related osteoporosis without current pathological fracture 12/04/2016   Osteoarthritis of knee 07/17/2016   Meningioma ([place]) 01/18/2016   Family history of aortic stenosis 01/20/2015   Status post laparoscopic sleeve gastrectomy Dec 2014 06/08/2013   Morbid obesity ([place]) 12/08/2012   Primary hypertension 10/11/2011   Varicose veins of bilateral lower extremities with other complications 86/76/1950   FIBROIDS, UTERUS 04/03/2007   Asthma, mild intermittent 04/03/2007    PCP: [name], MD  REFERRING PROVIDER: [name], MD  REFERRING DIAG: I89.0  THERAPY DIAG:  Lymphedema, not elsewhere classified - Plan: Ot plan of care cert/re-cert  Rationale for Evaluation and Treatment: Rehabilitation  ONSET DATE: 07/02/22  SUBJECTIVE:                                                                                                                                                                                           SUBJECTIVE STATEMENT:Kimberly Patton is referred to Occupational Therapy by [name], MD, for evaluation and treatment ofd BLE/BLQ lymphedema. Pt reports long term BLE swelling w/ exacerbation after a fall and injury to her R knee requiring multiple surgeries. Pt denies family hx of limb swelling. Pt has not previously undergone formal lymphedema treatment. She has worn off-the-shelf  compression stockings, but they typically fit poorly and hurt her legs. Pt's goal for OT is to reduce leg swelling and keep it from  getting worse.  PERTINENT HISTORY: HTN, MORBID OBESITY (BMI- 46.26), Afib, CVI, Varicose veins w/ inflammation, Periferal neuropathy, Asthma, OA , Dyspnea on Exertion (EF 60%), SVT, knee injury, Gastric sleeve 2014, C section, Pre-diabetes, Thyroid mass  PAIN:  Are you having pain? Yes: NPRS scale: 2/10 Pain location: r knee, B legs Pain description: sore, heavy, full, tired Aggravating factors: standing, walking, dependent sitting Relieving factors: elevation  PRECAUTIONS: Other: LYMPHEDEMA PRECAUTIONS: Thyroid, cardiac, pulmonary  WEIGHT BEARING RESTRICTIONS: No  FALLS:  Has patient fallen in last 6 months? No  LIVING ENVIRONMENT: Lives with: lives with their family and lives alone Lives in: House/apartment Has following equipment at home: None  OCCUPATION: Retired Conservation officer, historic buildings: walking, swimming  HAND DOMINANCE: right   PRIOR LEVEL OF FUNCTION:  Modified independent w/ extra time  PATIENT GOALS: reduce swelling, limit LE progression   OBJECTIVE:  COGNITION:  Overall cognitive status: Within functional limits for tasks assessed   OBSERVATIONS / OTHER ASSESSMENTS:   Mild, Stage  II, Bilateral Lower Extremity Lymphedema 2/2 CVI and Morbid Obesity  Skin  Description Hyper-Keratosis Peau' de Orange Shiny Tight Fibrotic/ Indurated Fatty Doughy Spongy/ boggy       R>L x  x   Skin dry Flaky WNL Macerated   mildly x     Color Redness Varicosities Blanching Hemosiderin Stain Mottled   x  x   x   Odor Malodorous Yeast Fungal infection  WNL      x   Temperature Warm Cool WNL    x     Pitting Edema   1+ 2+ 3+ 4+ Non-pitting         x   Girth Symmetrical Asymmetrical                   Distribution    R>L toes to groin bilaterally    Stemmer Sign Positive Negative    X Feet are spared (No ankle cuff)   Lymphorrhea History Of:  Present Absent     x    Wounds History Of Present Absent Venous Arterial Pressure Sheer     x        Signs of  Infection Absent Warmth Erythema Acute Swelling Drainage Borders   x                 Sensation Light Touch Deep pressure Hypersensitivity   Present Impaired Present Impaired Absent Impaired   Feet x  x  x     Nails WNL   Fungus nail dystrophy   x  x   Hair Growth Symmetrical Asymmetrical   x    Skin Creases Base of toes  Ankles   Base of Fingers knees       Abdominal pannus Thigh Lobules  Face/neck    x  x x       POSTURE: head forward, shoulders rounded, mild kyphosis  BLE ROM: Hip AROM limited at hips and knees by skin approximation  and girth (body habitus)  BLE STRENGTH: WFL for tasks assessed  LYMPHEDEMA ASSESSMENTS:   BLE COMPARATIVE LIMB VOLUMETRICS  LANDMARK RIGHT  INITIAL  TBA Rx visit 1  R LEG (A-D) N/A  R THIGH (E-G) ml  R FULL LIMB (A-G) ml  Limb Volume differential (LVD)  %  Volume change since initial %  Volume change overall V  (  Blank rows = not tested)  LANDMARK LEFT  BA Rx visit 1  R LEG (A-D) N/A  R THIGH (E-G) ml  R FULL LIMB (A-G) ml  Limb Volume differential (LVD)  %  Volume change since initial %  Volume change overall %  (Blank rows = not tested  FUNCTIONAL AMBULATION: Distance walked: 500' Assistive device utilized: None Level of assistance: Modified independence - extra time Comments: knee pain and limb heaviness. No rest breaks, no SOB  LYMPHEDEMA LIFE IMPACT SCALE (LLIS): TBA Rx visit 1  FOTO functional outcomes measure: Intake : TBA  Rx visit 1  TODAY'S TREATMENT:                                                                                                                                         DATE: 07/02/22  OT LYMPHEDEMA Evaluation  PATIENT EDUCATION:  Education details: Eval edu Provided basic level education regarding lymphatic structure and function, etiology, onset patterns, stages of progression, and prevention to limit infection risk, worsening condition and further functional decline. Pt edu for aught  interaction between blood circulatory system and lymphatic circulation.Discussed  impact of gravity and co-morbidities on lymphatic function. Outlined Complete Decongestive Therapy (CDT)  as standard of care and provided in depth information regarding 4 primary components of Intensive and Self Management Phases, including Manual Lymph Drainage (MLD), compression wrapping and garments, skin care, and therapeutic exercise. Pilar Plate discussion with re need for frequent attendance and high burden of care when caregiver is needed, impact of co morbidities. We discussed  the chronic, progressive nature of lymphedema and Importance of daily, ongoing LE self-care essential for limiting progression and infection risk.   Person educated: Patient Education method: Explanation, Demonstration, and Verbal cues, handouts Education comprehension: verbalized understanding, returned demonstration, and needs further education  LYMPHEDEMA SELF CARE HOME PROGRAM: BLE Lymphatic Pumping There ex Skin care to limit infection risk Compression Intensive stage compression: multilayer short stretch wraps with gradient techniques. One limb at a time. Self Management Phase Compression: Custom, flat knit,  BLE compression knee highs. Consider Elvarex classic,  ccl 2, OT, Oblique SB, Tricot patch; BLE Jobst Relax for HOS Simple self-MLD.\ Consider advanced Flexitouch device to assist w long term self management at home   Custom-made gradient compression garments and HOS devices are medically necessary in this case because they are uniquely sized and shaped to fit the exact dimensions of the affected extremities with deformities, and to provide accurate and consistent gradient compression essential to optimally managing this patient's symptoms of chronic, progressive Lipo-lymphedema. Multiple custom compression garments are needed for optimal hygiene to limit infection risk. Custom compression garments should be replaced q 3-6 months  When worn consistently for optimal lipo-lymphedema self-management over time.   ASSESSMENT:  CLINICAL IMPRESSION: Kimberly Patton is a 67 yo female presenting with BLE lymphedema 2/2 CVI and morbid obesity. Pt also presents with some signs  and symptoms in keeping with Lipo-lymphedema secondary to Lipedema. Lipedema is an inherited disorder of excessive fat deposition symmetrically  below the waist in the hips, abdomen, thighs and legs. In Lipedema  the feet are spared, as is true in Ms. Kuznicki's case. Ms Paschal does lack the typical ankle cuff formation. Legs are painful,  bruise easily, and are spongy and doughy to palpation  Lipedema results in Lipo-lymphedema when lymphatics become overloaded, entrapped and occluded within sclerosing, fatty tissue Many people with lipedema have a body mass index (BMI) higher than 35.Unfortunately the lipedema component of Lipo-lymphedema does not respond   well to Complete Decongestive Therapy (CDT), the gold standard care protocol for lymphedema at present. However, the lymphedema component may respond to varying degrees and correctly fitting compression garments can provide functional improvements   with mobility and pain relief/   Chronic progressive leg swelling and pain limit Ms. Leamer's  functional performance in all occupational domains, including basic and instrumental ADLs, productive activities, leisure pursuits, and body image. Pt will benefit from skilled Occupational Therapy for CDT optimally reduce limb swelling, decrease associated pain, limit disease progression and decrease infection risk. Without skilled OT for CDT, disease progression and further functional decline are expected.    OBJECTIVE IMPAIRMENTS: Abnormal gait, decreased balance, decreased knowledge of condition, decreased knowledge of use of DME, decreased mobility, decreased ROM, decreased strength, increased edema, postural dysfunction, obesity, and pain. Increased infection risk 2/2  chronic progressive lymphedema in BLE  ACTIVITY LIMITATIONS: bending, sitting, standing, squatting, stairs, transfers, bathing, dressing, hygiene/grooming, and Reaching feet to bathe , groom nails, inspect skin , perform skin care; difficulty fitting preferred street shoes and LB clothing 2/2 swelling, impaired home management- shopping, cleaning, yard work, long distance driving causes swelling to worsen;   standing to prep food and cook, social participation requiring standing, walking, dependent sitting > 30 minutes results in increased pain and swelling  PERSONAL FACTORS: Age, Time since onset of injury/illness/exacerbation, and 3+ comorbidities: (HTN, Afib, , morbid obesity, CVI)  are also affecting patient's functional outcome.   REHAB POTENTIAL: Good  EVALUATION COMPLEXITY: Moderate   GOALS: Goals reviewed with patient? Yes  SHORT TERM GOALS: Target date: 4TH to Rx VISIT   Pt will demonstrate understanding of lymphedema precautions and prevention strategies with modified independence using a printed reference to identify at least 5 precautions and discussing how s/he may implement them into daily life to reduce risk of progression with modified assistance. Baseline:Max A Goal status: INITIAL  2.  Pt will demonstrate understanding of obseity-related lymphedema and verbalize understanding that weight loss is the critical first step in the lymphedema plan of care for reducing limb volume, infection risk, and progression. Baseline: Max A Goal status: INITIAL  3.   With Max caregiver assistance Pt will be able to apply multilayer, knee length, multilayer, compression wraps using gradient techniques to decrease limb volume, to limit infection risk, and to limit lymphedema progression.  Baseline: Dependent Goal status: INITIAL  NOTE: Caregiver must attend clinic   to lean wrapping techniques.Pt will not be successful with compression wrappy withut caregiver success due to limited AROM  needed to reach feet and distal legs.   LONG TERM GOALS: Target date: 09/30/22  Given this patient's Intake score of TBA /100% on the functional outcomes FOTO tool, patient will experience an increase in function of 3 points to improve basic and instrumental ADLs performance, including lymphedema self-care. (TBA at first OT Rx visit) Baseline: Max A Goal status: INITIAL  2.  Given this patient's Intake score of TBA % on the Lymphedema Life Impact Scale (LLIS), patient will experience a reduction of at least 5% in her perceived level of functional impairment resulting from lymphedema to improve functional performance and quality of life (QOL). Baseline: Max A Goal status: INITIAL  3.  Pt will achieve at least a 10% volume reductions bilaterally below the knees to return limb to more typical size and shape, to limit infection risk and LE progression, to decrease pain, to improve function, and to improve body image and QOL. Baseline: Dependent Goal status: INITIAL  4.  Pt will achieve and sustain at least 85% consistent compliance with all 4 lymphedema selfd-care home program components throughout Intensive Phase of CDT to achieve optimal limb volume reduction, limit infecton risk, and limit LE progression. Baseline: Dependent Goal status: INITIAL  5.  Pt will be able to don and doff custom     compression garments using assistive devices to achieve optimal LE self management over time. Baseline: Dependent Goal status: INITIAL   PLAN:  PT FREQUENCY: 2x/week  PT DURATION: 12 weeks and PRN  PLANNED INTERVENTIONS: Therapeutic exercises, Therapeutic activity, Patient/Family education, Self Care, DME instructions, Manual lymph drainage, Compression bandaging, Manual therapy, and skin care, custom compression garment measurement and fitting, consider trial with advanced Flexitouch sequential pneumatic compression device if OK's by MD  PLAN FOR NEXT SESSION:  BLE full limb comparative limb  volumetrics Multilayer knee length compression wrapping using gradient techniques from base of toes to popliteal fossa to RLE. Ongoing Pt edu for LE self care  Andrey Spearman, MS, OTR/L, CLT-LANA 07/09/22 2:54 PM

## 2022-07-09 NOTE — Addendum Note (Signed)
Addended by: Ansel Bong on: 07/09/2022 02:57 PM   Modules accepted: Orders

## 2022-07-09 NOTE — Therapy (Signed)
OUTPATIENT OCCUPATIONAL THERAPY TREATMENT NOTE  LOWER EXTREMITY LYMPHEDEMA  Patient Name: Kimberly Patton MRN: [id number] DOB:26-Sep-1955, 67 y.o., female Today's Date: 07/09/2022  END OF SESSION:  OT End of Session - 07/09/22 1605     Visit Number 2    Number of Visits 36    Date for OT Re-Evaluation 09/30/22    OT Start Time 0805    OT Stop Time 0905    OT Time Calculation (min) 60 min    Activity Tolerance Patient tolerated treatment well;No increased pain    Behavior During Therapy [facility] for tasks assessed/performed               Past Medical History:  Diagnosis Date   ALLERGIC RHINITIS 04/03/2007   Allergy    Not sure   Anemia    ((Pt Qnr Sub: Denies at visit from 08/28/2021 with endocrinology))    Anginal pain ([place])    Anxiety    Arthritis Not sure   Asthma    hx of years ago - no longer a problem    ASTHMA, PERSISTENT, MODERATE 04/03/2007   Cancer ([place])    skin    Dyspnea on exertion    a. 11/2007 Echo: EF 60%.   Dysrhythmia    hx of heart arrhythmia 10-15 years ago - followed by DR Lovena Le - not seen in years    GERD (gastroesophageal reflux disease)    Hemophilia carrier    Hypercholesteremia 06/09/2019   Hypertension    Joint pain    HIPS / LEGS   Knee injury    RT   Meningioma ([place])    Morbid obesity ([place])    Neuromuscular disorder ([place]) Not sure, since my fall   Paroxysmal SVT (supraventricular tachycardia)    a. 11/2011 48h Holter: RSR, rare PVC's, occas PAC's   Pre-diabetes    Retinal tear of right eye 01/2016   Thyroid nodule 04/15/2018   Ventral hernia    Past Surgical History:  Procedure Laterality Date   [place]   ARTERY BIOPSY Left 03/21/2022   Procedure: BIOPSY TEMPORAL ARTERY;  Surgeon: Katha Cabal, MD;  Location: [facility] ORS;  Service: Vascular;  Laterality: Left;   BIOPSY THYROID     08/2021   BREAST CYST EXCISION Right 1994   Benign   CESAREAN SECTION     x2    CHOLECYSTECTOMY  1995   COLONOSCOPY WITH PROPOFOL N/A 05/09/2021   Procedure: COLONOSCOPY WITH PROPOFOL;  Surgeon: [name], MD;  Location: [facility] ENDOSCOPY;  Service: Gastroenterology;  Laterality: N/A;   EYE SURGERY  01/2016   Repair retinal tear   FOOT SURGERY  1995 / 1996   x2   HERNIA REPAIR     KNEE ARTHROSCOPY W/ MENISCAL REPAIR  07/25/2013   right knee Dr. Mardelle Matte   LAPAROSCOPIC GASTRIC SLEEVE RESECTION N/A 06/08/2013   Procedure: LAPAROSCOPIC GASTRIC SLEEVE RESECTION AND EXCISION OF SEBACEUS CYST FROM MID CHEST takedown of incarcerated ventral hernia and primary repair endoscopy;  Surgeon: [name], MD;  Location: [facility] ORS;  Service: General;  Laterality: N/A;   LAPAROSCOPIC NISSEN FUNDOPLICATION N/A 81/15/7262   Procedure: LAPAROSCOPIC REPAIR LARGE SYMPTOMATIC HIATAL HERNIA WITH UPPER ENDOSCOPY;  Surgeon: Johnathan Hausen, MD;  Location: [facility] ORS;  Service: General;  Laterality: N/A;   moles  06/2013   removed 2 moles from under arm and  lowback   NECK SURGERY     occipital nerve damage- injections  OVARIAN CYST REMOVAL  1970   PILONIDAL CYST EXCISION  1975   RADIOACTIVE SEED GUIDED EXCISIONAL BREAST BIOPSY Right 06/23/2021   Procedure: RADIOACTIVE SEED GUIDED EXCISIONAL RIGHT BREAST BIOPSY;  Surgeon: Johnathan Hausen, MD;  Location: [place];  Service: General;  Laterality: Right;   RETINAL TEAR REPAIR CRYOTHERAPY Right 01/2016   [place]   SKIN CANCER EXCISION     TONSILLECTOMY     TUBAL LIGATION  [place] 1994   UPPER GI ENDOSCOPY  06/08/2013   Procedure: UPPER GI ENDOSCOPY;  Surgeon: [name], MD;  Location: [facility] ORS;  Service: General;;   US ECHOCARDIOGRAPHY  12/2011   WNL - EF 55-60%, mild MR, grade 1 diastolic dysnfiction (mild)   VENTRAL HERNIA REPAIR  2000   Patient Active Problem List   Diagnosis Date Noted   COVID-19 virus infection 04/30/2022   Paroxysmal atrial fibrillation ([place]) 03/28/2022   Empty sella syndrome ([place]) 03/28/2022    Varicose veins with inflammation 01/07/2022   S/P breast lumpectomy 10/10/2021   Cervical radiculopathy 06/28/2020   Chronic venous insufficiency 05/29/2020   Lymphedema 05/29/2020   CAD (coronary artery disease) 05/29/2020   Cervical lymphadenitis 04/18/2020   History of repair of hiatal hernia 02/02/2020   Orthostatic hypotension 01/01/2020   Hypercholesteremia 06/09/2019   Chronic neuropathic pain 07/31/2018   Thyroid nodule 04/15/2018   Skin lesion of scalp 09/06/2017   Age-related osteoporosis without current pathological fracture 12/04/2016   Osteoarthritis of knee 07/17/2016   Meningioma ([place]) 01/18/2016   Family history of aortic stenosis 01/20/2015   Status post laparoscopic sleeve gastrectomy Dec 2014 06/08/2013   Morbid obesity ([place]) 12/08/2012   Primary hypertension 10/11/2011   Varicose veins of bilateral lower extremities with other complications 34/74/2595   FIBROIDS, UTERUS 04/03/2007   Asthma, mild intermittent 04/03/2007    PCP: [name], MD  REFERRING PROVIDER: [name], MD  REFERRING DIAG: I89.0  THERAPY DIAG:  Lymphedema, not elsewhere classified  Rationale for Evaluation and Treatment: Rehabilitation  ONSET DATE: 07/02/22  SUBJECTIVE:                                                                                                                                                                                           SUBJECTIVE STATEMENT:Kimberly Patton PRESENTS FOR ot rX VISIT TO ADDRESS ble LYMPHEDEMA. pT REPORTS SHE MISSED LAST SCHEDULED SESSION DUE TO acute swelling in R knee joint, orthopedist deemed to be effusion of  knee joint. Pt has no new complaints. She walks to clinic with steady   gait and slightoly slowed pace. Pt denies new concernes. LE related pain is unchanged since  last seen.    PERTINENT HISTORY: HTN, MORBID OBESITY (BMI- 46.26), Afib, CVI, Varicose veins w/ inflammation, Periferal neuropathy, Asthma, OA , Dyspnea on Exertion  (EF 60%), SVT, knee injury, Gastric sleeve 2014, C section, Pre-diabetes, Thyroid mass  PAIN:  Are you having pain? Yes: NPRS scale: 2/10 Pain location: r knee, B legs Pain description: sore, heavy, full, tired Aggravating factors: standing, walking, dependent sitting Relieving factors: elevation  PRECAUTIONS: Other: LYMPHEDEMA PRECAUTIONS: Thyroid, cardiac, pulmonary  FALLS  SINCE LAST VISIT: No  HAND DOMINANCE: right   PRIOR LEVEL OF FUNCTION:  Modified independent w/ extra time  PATIENT GOALS: reduce swelling, limit LE progression   OBJECTIVE:  OBSERVATIONS / OTHER ASSESSMENTS:  Mild, Stage  II, Bilateral Lower Extremity Lymphedema 2/2 CVI and Morbid Obesity (suspect Lipedema component)    BLE COMPARATIVE LIMB VOLUMETRICS  LANDMARK INITIAL RIGHT  INITIAL  07/06/22  R LEG (A-D)   R THIGH (E-G) ml  R FULL LIMB (A-G) ml  Limb Volume differential (LVD)  %  Volume change since initial %  Volume change overall V  (Blank rows = not tested)  LANDMARK LEFT  BA Rx visit 1  R LEG (A-D) N/A  R THIGH (E-G) ml  R FULL LIMB (A-G) ml  Limb Volume differential (LVD)  %  Volume change since initial %  Volume change overall %  (Blank rows = not tested  FUNCTIONAL AMBULATION: Distance walked: 500' Assistive device utilized: None Level of assistance: Modified independence - extra time Comments: knee pain and limb heaviness. No rest breaks, no SOB  LYMPHEDEMA LIFE IMPACT SCALE (LLIS): TBA Rx visit 1  FOTO functional outcomes measure: Intake : TBA  Rx visit 1  TODAY'S TREATMENT:                                                                                                                                         DATE: 07/02/22  OT LYMPHEDEMA Evaluation  PATIENT EDUCATION:  Education details: Eval edu Provided basic level education regarding lymphatic structure and function, etiology, onset patterns, stages of progression, and prevention to limit infection risk, worsening  condition and further functional decline. Pt edu for aught interaction between blood circulatory system and lymphatic circulation.Discussed  impact of gravity and co-morbidities on lymphatic function. Outlined Complete Decongestive Therapy (CDT)  as standard of care and provided in depth information regarding 4 primary components of Intensive and Self Management Phases, including Manual Lymph Drainage (MLD), compression wrapping and garments, skin care, and therapeutic exercise. Pilar Plate discussion with re need for frequent attendance and high burden of care when caregiver is needed, impact of co morbidities. We discussed  the chronic, progressive nature of lymphedema and Importance of daily, ongoing LE self-care essential for limiting progression and infection risk.   Person educated: Patient Education method: Explanation, Demonstration, and Verbal cues, handouts Education comprehension: verbalized understanding, returned demonstration, and needs further education  LYMPHEDEMA  SELF CARE HOME PROGRAM: BLE Lymphatic Pumping There ex Skin care to limit infection risk Compression Intensive stage compression: multilayer short stretch wraps with gradient techniques. One limb at a time. Self Management Phase Compression: Custom, flat knit,  BLE compression knee highs. Consider Elvarex classic,  ccl 2, OT, Oblique SB, Tricot patch; BLE Jobst Relax for HOS Simple self-MLD.\ Consider advanced Flexitouch device to assist w long term self management at home   Custom-made gradient compression garments and HOS devices are medically necessary in this case because they are uniquely sized and shaped to fit the exact dimensions of the affected extremities with deformities, and to provide accurate and consistent gradient compression essential to optimally managing this patient's symptoms of chronic, progressive Lipo-lymphedema. Multiple custom compression garments are needed for optimal hygiene to limit infection risk.  Custom compression garments should be replaced q 3-6 months When worn consistently for optimal lipo-lymphedema self-management over time.   ASSESSMENT:  CLINICAL IMPRESSION: Tanieka Pownall is a 67 yo female presenting with BLE lymphedema 2/2 CVI and morbid obesity. 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 Lipedema is an inherited disorder of excessive fat deposition symmetrically  below the waist in the hips, abdomen, thighs and legs. In Lipedema  the feet are spared, as is true in Ms. Quaintance's case. Ms Tolson does lack the typical ankle cuff formation. Legs are painful,  bruise easily, and are spongy and doughy to palpation  Lipedema results in Lipo-lymphedema when lymphatics become overloaded, entrapped and occluded within sclerosing, fatty tissue Many people with lipedema have a body mass index (BMI) higher than 35.Unfortunately the lipedema component of Lipo-lymphedema does not respond   well to Complete Decongestive Therapy (CDT), the gold standard care protocol for lymphedema at present. However, the lymphedema component may respond to varying degrees and correctly fitting compression garments can provide functional improvements   with mobility and pain relief/   Chronic progressive leg swelling and pain limit Ms. Yager's  functional performance in all occupational domains, including basic and instrumental ADLs, productive activities, leisure pursuits, and body image. Pt will benefit from skilled Occupational Therapy for CDT optimally reduce limb swelling, decrease associated pain, limit disease progression and decrease infection risk. Without skilled OT for CDT, disease progression and further functional decline are expected.    OBJECTIVE IMPAIRMENTS: Abnormal gait, decreased balance, decreased knowledge of condition, decreased knowledge of use of DME, decreased mobility, decreased ROM, decreased strength, increased edema, postural  dysfunction, obesity, and pain. Increased infection risk 2/2 chronic progressive lymphedema in BLE  ACTIVITY LIMITATIONS: bending, sitting, standing, squatting, stairs, transfers, bathing, dressing, hygiene/grooming, and Reaching feet to bathe , groom nails, inspect skin , perform skin care; difficulty fitting preferred street shoes and LB clothing 2/2 swelling, impaired home management- shopping, cleaning, yard work, long distance driving causes swelling to worsen;   standing to prep food and cook, social participation requiring standing, walking, dependent sitting > 30 minutes results in increased pain and swelling  PERSONAL FACTORS: Age, Time since onset of injury/illness/exacerbation, and 3+ comorbidities: (HTN, Afib, , morbid obesity, CVI)  are also affecting patient's functional outcome.   REHAB POTENTIAL: Good  EVALUATION COMPLEXITY: Moderate   GOALS: Goals reviewed with patient? Yes  SHORT TERM GOALS: Target date: 4TH to Rx VISIT   Pt will demonstrate understanding of lymphedema precautions and prevention strategies with modified independence using a printed reference to identify at least 5 precautions and discussing how s/he may implement them into daily life  to reduce risk of progression with modified assistance. Baseline:Max A Goal status: INITIAL  2.  Pt will demonstrate understanding of obseity-related lymphedema and verbalize understanding that weight loss is the critical first step in the lymphedema plan of care for reducing limb volume, infection risk, and progression. Baseline: Max A Goal status: INITIAL  3.   With Max caregiver assistance Pt will be able to apply multilayer, knee length, multilayer, compression wraps using gradient techniques to decrease limb volume, to limit infection risk, and to limit lymphedema progression.  Baseline: Dependent Goal status: INITIAL  NOTE: Caregiver must attend clinic   to lean wrapping techniques.Pt will not be successful with  compression wrappy withut caregiver success due to limited AROM needed to reach feet and distal legs.   LONG TERM GOALS: Target date: 09/30/22  Given this patient's Intake score of TBA /100% on the functional outcomes FOTO tool, patient will experience an increase in function of 3 points to improve basic and instrumental ADLs performance, including lymphedema self-care. (TBA at first OT Rx visit) Baseline: Max A Goal status: INITIAL  2.  Given this patient's Intake score of TBA % on the Lymphedema Life Impact Scale (LLIS), patient will experience a reduction of at least 5% in her perceived level of functional impairment resulting from lymphedema to improve functional performance and quality of life (QOL). Baseline: Max A Goal status: INITIAL  3.  Pt will achieve at least a 10% volume reductions bilaterally below the knees to return limb to more typical size and shape, to limit infection risk and LE progression, to decrease pain, to improve function, and to improve body image and QOL. Baseline: Dependent Goal status: INITIAL  4.  Pt will achieve and sustain at least 85% consistent compliance with all 4 lymphedema selfd-care home program components throughout Intensive Phase of CDT to achieve optimal limb volume reduction, limit infecton risk, and limit LE progression. Baseline: Dependent Goal status: INITIAL  5.  Pt will be able to don and doff custom     compression garments using assistive devices to achieve optimal LE self management over time. Baseline: Dependent Goal status: INITIAL   PLAN:  PT FREQUENCY: 2x/week  PT DURATION: 12 weeks and PRN  PLANNED INTERVENTIONS: Therapeutic exercises, Therapeutic activity, Patient/Family education, Self Care, DME instructions, Manual lymph drainage, Compression bandaging, Manual therapy, and skin care, custom compression garment measurement and fitting, consider trial with advanced Flexitouch sequential pneumatic compression device if OK's by  MD  PLAN FOR NEXT SESSION:  BLE full limb comparative limb volumetrics Multilayer knee length compression wrapping using gradient techniques from base of toes to popliteal fossa to RLE. Ongoing Pt edu for LE self care  Andrey Spearman, MS, OTR/L, CLT-LANA 07/09/22 4:07 PM

## 2022-07-11 ENCOUNTER — Encounter: Payer: Self-pay | Admitting: Occupational Therapy

## 2022-07-11 ENCOUNTER — Ambulatory Visit: Payer: PPO | Admitting: Occupational Therapy

## 2022-07-11 ENCOUNTER — Other Ambulatory Visit
Admission: RE | Admit: 2022-07-11 | Discharge: 2022-07-11 | Disposition: A | Discharge status: Home / Self Care | Payer: PPO | Attending: Cardiology | Admitting: Cardiology

## 2022-07-11 DIAGNOSIS — I890 Lymphedema, not elsewhere classified: Secondary | ICD-10-CM

## 2022-07-11 DIAGNOSIS — I480 Paroxysmal atrial fibrillation: Secondary | ICD-10-CM | POA: Diagnosis not present

## 2022-07-11 DIAGNOSIS — I10 Essential (primary) hypertension: Secondary | ICD-10-CM | POA: Diagnosis not present

## 2022-07-11 LAB — LIPID PANEL
Cholesterol: 138 mg/dL (ref 0–200)
HDL: 61 mg/dL (ref >40)
LDL Cholesterol: 61 mg/dL (ref 0–99)
Total CHOL/HDL Ratio: 2.3 RATIO
Triglycerides: 82 mg/dL (ref <150)
VLDL: 16 mg/dL (ref 0–40)

## 2022-07-11 NOTE — Therapy (Signed)
OUTPATIENT OCCUPATIONAL THERAPY TREATMENT NOTE  LOWER EXTREMITY LYMPHEDEMA  Patient Name: Kimberly Patton MRN: [id number] DOB:01/22/1956, 67 y.o., female Today's Date: 07/11/2022  END OF SESSION:  OT End of Session - 07/11/22 0807     Visit Number 3    Number of Visits 36    Date for OT Re-Evaluation 09/30/22    OT Start Time 0800    OT Stop Time 0853    OT Time Calculation (min) 53 min    Activity Tolerance Patient tolerated treatment well;No increased pain    Behavior During Therapy [facility] for tasks assessed/performed               Past Medical History:  Diagnosis Date   ALLERGIC RHINITIS 04/03/2007   Allergy    Not sure   Anemia    ((Pt Qnr Sub: Denies at visit from 08/28/2021 with endocrinology))    Anginal pain ([place])    Anxiety    Arthritis Not sure   Asthma    hx of years ago - no longer a problem    ASTHMA, PERSISTENT, MODERATE 04/03/2007   Cancer ([place])    skin    Dyspnea on exertion    a. 11/2007 Echo: EF 60%.   Dysrhythmia    hx of heart arrhythmia 10-15 years ago - followed by DR Lovena Le - not seen in years    GERD (gastroesophageal reflux disease)    Hemophilia carrier    Hypercholesteremia 06/09/2019   Hypertension    Joint pain    HIPS / LEGS   Knee injury    RT   Meningioma ([place])    Morbid obesity ([place])    Neuromuscular disorder ([place]) Not sure, since my fall   Paroxysmal SVT (supraventricular tachycardia)    a. 11/2011 48h Holter: RSR, rare PVC's, occas PAC's   Pre-diabetes    Retinal tear of right eye 01/2016   Thyroid nodule 04/15/2018   Ventral hernia    Past Surgical History:  Procedure Laterality Date   [place]   ARTERY BIOPSY Left 03/21/2022   Procedure: BIOPSY TEMPORAL ARTERY;  Surgeon: Katha Cabal, MD;  Location: [facility] ORS;  Service: Vascular;  Laterality: Left;   BIOPSY THYROID     08/2021   BREAST CYST EXCISION Right 1994   Benign   CESAREAN SECTION     x2    CHOLECYSTECTOMY  1995   COLONOSCOPY WITH PROPOFOL N/A 05/09/2021   Procedure: COLONOSCOPY WITH PROPOFOL;  Surgeon: [name], MD;  Location: [facility] ENDOSCOPY;  Service: Gastroenterology;  Laterality: N/A;   EYE SURGERY  01/2016   Repair retinal tear   FOOT SURGERY  1995 / 1996   x2   HERNIA REPAIR     KNEE ARTHROSCOPY W/ MENISCAL REPAIR  07/25/2013   right knee Dr. Mardelle Matte   LAPAROSCOPIC GASTRIC SLEEVE RESECTION N/A 06/08/2013   Procedure: LAPAROSCOPIC GASTRIC SLEEVE RESECTION AND EXCISION OF SEBACEUS CYST FROM MID CHEST takedown of incarcerated ventral hernia and primary repair endoscopy;  Surgeon: [name], MD;  Location: [facility] ORS;  Service: General;  Laterality: N/A;   LAPAROSCOPIC NISSEN FUNDOPLICATION N/A 01/75/1025   Procedure: LAPAROSCOPIC REPAIR LARGE SYMPTOMATIC HIATAL HERNIA WITH UPPER ENDOSCOPY;  Surgeon: Johnathan Hausen, MD;  Location: [facility]S;  Service: General;  Laterality: N/A;   moles  06/2013   removed 2 moles from under arm and  lowback   NECK SURGERY     occipital nerve damage- injections  OVARIAN CYST REMOVAL  1970   PILONIDAL CYST EXCISION  1975   RADIOACTIVE SEED GUIDED EXCISIONAL BREAST BIOPSY Right 06/23/2021   Procedure: RADIOACTIVE SEED GUIDED EXCISIONAL RIGHT BREAST BIOPSY;  Surgeon: Johnathan Hausen, MD;  Location: [place];  Service: General;  Laterality: Right;   RETINAL TEAR REPAIR CRYOTHERAPY Right 01/2016   [place]   SKIN CANCER EXCISION     TONSILLECTOMY     TUBAL LIGATION  Think 1994   UPPER GI ENDOSCOPY  06/08/2013   Procedure: UPPER GI ENDOSCOPY;  Surgeon: [name], MD;  Location: [facility];  Service: General;;   US ECHOCARDIOGRAPHY  12/2011   WNL - EF 55-60%, mild MR, grade 1 diastolic dysnfiction (mild)   VENTRAL HERNIA REPAIR  2000   Patient Active Problem List   Diagnosis Date Noted   COVID-19 virus infection 04/30/2022   Paroxysmal atrial fibrillation ([place]) 03/28/2022   Empty sella syndrome ([place]) 03/28/2022    Varicose veins with inflammation 01/07/2022   S/P breast lumpectomy 10/10/2021   Cervical radiculopathy 06/28/2020   Chronic venous insufficiency 05/29/2020   Lymphedema 05/29/2020   CAD (coronary artery disease) 05/29/2020   Cervical lymphadenitis 04/18/2020   History of repair of hiatal hernia 02/02/2020   Orthostatic hypotension 01/01/2020   Hypercholesteremia 06/09/2019   Chronic neuropathic pain 07/31/2018   Thyroid nodule 04/15/2018   Skin lesion of scalp 09/06/2017   Age-related osteoporosis without current pathological fracture 12/04/2016   Osteoarthritis of knee 07/17/2016   Meningioma ([place]) 01/18/2016   Family history of aortic stenosis 01/20/2015   Status post laparoscopic sleeve gastrectomy Dec 2014 06/08/2013   Morbid obesity ([place]) 12/08/2012   Primary hypertension 10/11/2011   Varicose veins of bilateral lower extremities with other complications 34/19/6222   FIBROIDS, UTERUS 04/03/2007   Asthma, mild intermittent 04/03/2007    PCP: Kimberly Lofts, MD  REFERRING PROVIDER: [name], MD  REFERRING DIAG: I89.0  THERAPY DIAG:  Lymphedema, not elsewhere classified  Rationale for Evaluation and Treatment: Rehabilitation  ONSET DATE: 07/02/22  SUBJECTIVE:                                                                                                                                                                                           SUBJECTIVE STATEMENT:Kimberly Patton PRESENTS FOR ot Rx VISIT TO ADDRESS BLE LYMPHEDEMA. Pt reports she tolerated compression wraps over night despite " a little bit of burning in my foot". Pt reports 4/10 pain behind her R knee. Pt has no new worries or concerns this morning.  PERTINENT HISTORY: HTN, MORBID OBESITY (BMI- 46.26), Afib, CVI, Varicose veins w/ inflammation, Periferal neuropathy, Asthma,  OA , Dyspnea on Exertion (EF 60%), SVT, knee injury, Gastric sleeve 2014, C section, Pre-diabetes, Thyroid mass  PAIN:  Are you  having pain? Yes: NPRS scale: 4/10 Pain location: R knee, B legs Pain description: sore, heavy, full, tired Aggravating factors: standing, walking, dependent sitting Relieving factors: elevation  PRECAUTIONS: Other: LYMPHEDEMA PRECAUTIONS: Thyroid, cardiac, pulmonary  FALLS  SINCE LAST VISIT: No  HAND DOMINANCE: right   PRIOR LEVEL OF FUNCTION:  Modified independent w/ extra time  PATIENT GOALS: reduce swelling, limit LE progression   OBJECTIVE:  OBSERVATIONS / OTHER ASSESSMENTS:  Mild, Stage  II, Bilateral Lower Extremity Lymphedema 2/2 CVI and Morbid Obesity (suspect Lipedema component)    BLE COMPARATIVE LIMB VOLUMETRICS  LANDMARK INITIAL RIGHT  07/06/22  R LEG (A-D) 9622.4 ml  R THIGH (E-G) 11079.5 ml  R FULL LIMB (A-G) 20702.0 ml  Limb Volume differential (LVD)   R dominant LVD Leg = 4.6%, R>L; LVD thigh= 11079.5%, R>L; LVD full  limb 8.4%, R>L%  Volume change since initial %  Volume change overall V  (Blank rows = not tested)  LANDMARK INITIAL LLE 07/06/22  L LEG (A-D) 9183.3 ml  L THIGH (E-G) 9771.3 ml  RLFULL LIMB (A-G) 18957 ml  Limb Volume differential (LVD)  N/A  Volume change since initial %  Volume change overall %  (Blank rows = not tested  FUNCTIONAL AMBULATION: Distance walked: 500' Assistive device utilized: None Level of assistance: Modified independence - extra time Comments: knee pain and limb heaviness. No rest breaks, no SOB  LYMPHEDEMA LIFE IMPACT SCALE (LLIS): Intake TBA  FOTO functional outcomes measure: Intake :Intake  TBA   Height: 5'6" Weight: 286.9 lb BMI: 46.26  TODAY'S TREATMENT:                                                                                                                                         DATE: 07/02/22  OT Rx : Intensive Phase CDT RLE  PATIENT EDUCATION:  Education details: edu for knee length, multilayer compression wrapping omitting foot, using short stretch compression bandages and gradient  techniques. Pt had multiple opportunities to practice. Person educated: Patient Education method: Explanation, Demonstration, and Verbal cues, handouts Education comprehension: verbalized understanding, returned demonstration, and needs further education  LYMPHEDEMA SELF CARE HOME PROGRAM: BLE Lymphatic Pumping There ex Skin care to limit infection risk Compression Intensive stage compression: multilayer short stretch wraps with gradient techniques: One each 8, 10 and 12 cm wide by 5 M long short stretch compression wrap applied circumferentially using staggered, gradient technique over single layer of .04 cm Rosidal foam and cotton stockinett.  Foot omitted. Self Management Phase Compression: Custom, flat knit,  BLE compression knee highs. Consider Elvarex classic,  ccl 2, OT, Oblique SB, Tricot patch; BLE Jobst Relax for HOS Simple self-MLD. Consider advanced Flexitouch device to assist w long term self management at home   Custom-made  gradient compression garments and HOS devices are medically necessary in this case because they are uniquely sized and shaped to fit the exact dimensions of the affected extremities with deformities, and to provide accurate and consistent gradient compression essential to optimally managing this patient's symptoms of chronic, progressive Lipo-lymphedema. Multiple custom compression garments are needed for optimal hygiene to limit infection risk. Custom compression garments should be replaced q 3-6 months When worn consistently for optimal lipo-lymphedema self-management over time.   ASSESSMENT:  CLINICAL IMPRESSION: After skilled Pt edu Pt was able to apply short stretch compression wraps to R leg, omitting the foot, with extra time and Min A. Pt learned quickly. Pt's skills will improve with practice. Biggest obstacle is limited ability to reach feet and legs due to body habitus. Next visit we'll commence RLE/RLQ MLD. Cont as per POC.    OBJECTIVE  IMPAIRMENTS: Abnormal gait, decreased balance, decreased knowledge of condition, decreased knowledge of use of DME, decreased mobility, decreased ROM, decreased strength, increased edema, postural dysfunction, obesity, and pain. Increased infection risk 2/2 chronic progressive lymphedema in BLE  ACTIVITY LIMITATIONS: bending, sitting, standing, squatting, stairs, transfers, bathing, dressing, hygiene/grooming, and Reaching feet to bathe , groom nails, inspect skin , perform skin care; difficulty fitting preferred street shoes and LB clothing 2/2 swelling, impaired home management- shopping, cleaning, yard work, long distance driving causes swelling to worsen;   standing to prep food and cook, social participation requiring standing, walking, dependent sitting > 30 minutes results in increased pain and swelling  PERSONAL FACTORS: Age, Time since onset of injury/illness/exacerbation, and 3+ comorbidities: (HTN, Afib, , morbid obesity, CVI)  are also affecting patient's functional outcome.   REHAB POTENTIAL: Good  EVALUATION COMPLEXITY: Moderate   GOALS: Goals reviewed with patient? Yes  SHORT TERM GOALS: Target date: 4TH to Rx VISIT   Pt will demonstrate understanding of lymphedema precautions and prevention strategies with modified independence using a printed reference to identify at least 5 precautions and discussing how s/he may implement them into daily life to reduce risk of progression with modified assistance. Baseline:Max A Goal status: INITIAL  2.  Pt will demonstrate understanding of obesity-related lymphedema and verbalize understanding that weight loss is the critical first step in the lymphedema plan of care for reducing limb volume, infection risk, and progression. Baseline: Max A Goal status: INITIAL  3.   With Max caregiver assistance Pt will be able to apply multilayer, knee length, multilayer, compression wraps using gradient techniques to decrease limb volume, to limit  infection risk, and to limit lymphedema progression.  Baseline: Dependent Goal status: INITIAL  07/10/22 Goal Modified: Pt able to apply knee length wraps with min A and extra time today after edu. No caregiver needed to assist.    LONG TERM GOALS: Target date: 09/30/22  Given this patient's Intake score of TBA /100% on the functional outcomes FOTO tool, patient will experience an increase in function of 3 points to improve basic and instrumental ADLs performance, including lymphedema self-care. (TBA at first OT Rx visit) Baseline: Max A Goal status: INITIAL  2.  Given this patient's Intake score of TBA % on the Lymphedema Life Impact Scale (LLIS), patient will experience a reduction of at least 5% in her perceived level of functional impairment resulting from lymphedema to improve functional performance and quality of life (QOL). Baseline: Max A Goal status: INITIAL  3.  Pt will achieve at least a 10% volume reductions bilaterally below the knees to return limb to more typical size  and shape, to limit infection risk and LE progression, to decrease pain, to improve function, and to improve body image and QOL. Baseline: Dependent Goal status: INITIAL  4.  Pt will achieve and sustain at least 85% consistent compliance with all 4 lymphedema self-care home program components throughout Intensive Phase of CDT to achieve optimal limb volume reduction, limit infection risk, and limit LE progression. Baseline: Dependent Goal status: INITIAL  5.  Pt will be able to don and doff custom     compression garments using assistive devices to achieve optimal LE self management over time. Baseline: Dependent Goal status: INITIAL   PLAN:  PT FREQUENCY: 2x/week  PT DURATION: 12 weeks and PRN  PLANNED INTERVENTIONS: Therapeutic exercises, Therapeutic activity, Patient/Family education, Self Care, DME instructions, Manual lymph drainage, Compression bandaging, Manual therapy, and skin care, custom  compression garment measurement and fitting, consider trial with advanced Flexitouch sequential pneumatic compression device if OK's by MD  PLAN FOR NEXT SESSION:  BLE full limb comparative limb volumetrics Multilayer knee length compression wrapping using gradient techniques from base of toes to popliteal fossa to RLE. Ongoing Pt edu for LE self care  Andrey Spearman, MS, OTR/L, CLT-LANA 07/11/22 9:15 AM

## 2022-07-12 ENCOUNTER — Ambulatory Visit
Admission: RE | Admit: 2022-07-12 | Discharge: 2022-07-12 | Disposition: A | Discharge status: Home / Self Care | Payer: PPO | Source: Ambulatory Visit | Attending: Family Medicine | Admitting: Family Medicine

## 2022-07-12 ENCOUNTER — Encounter: Payer: Self-pay | Admitting: Internal Medicine

## 2022-07-12 DIAGNOSIS — Z1231 Encounter for screening mammogram for malignant neoplasm of breast: Secondary | ICD-10-CM

## 2022-07-14 ENCOUNTER — Encounter: Payer: Self-pay | Admitting: Internal Medicine

## 2022-07-14 NOTE — Progress Notes (Unsigned)
MRN : [id number]  Kimberly Patton is a 67 y.o. (05-10-1956) female who presents with chief complaint of varicose veins hurt.  History of Present Illness: ***  No outpatient medications have been marked as taking for the 07/16/22 encounter (Appointment) with Delana Meyer, Dolores Lory, MD.    Past Medical History:  Diagnosis Date   ALLERGIC RHINITIS 04/03/2007   Allergy    Not sure   Anemia    ((Pt Qnr Sub: Denies at visit from 08/28/2021 with endocrinology))    Anginal pain ([place])    Anxiety    Arthritis Not sure   Asthma    hx of years ago - no longer a problem    ASTHMA, PERSISTENT, MODERATE 04/03/2007   Cancer ([place])    skin    Dyspnea on exertion    a. 11/2007 Echo: EF 60%.   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 11/2011 48h Holter: RSR, rare PVC's, occas PAC's   Pre-diabetes    Retinal tear of right eye 01/2016   Thyroid nodule 04/15/2018   Ventral hernia     Past Surgical History:  Procedure Laterality Date   [place]   ARTERY BIOPSY Left 03/21/2022   Procedure: BIOPSY TEMPORAL ARTERY;  Surgeon: Katha Cabal, MD;  Location: [facility] ORS;  Service: Vascular;  Laterality: Left;   BIOPSY THYROID     08/2021   BREAST CYST EXCISION Right 1994   Benign   CESAREAN SECTION     x2   CHOLECYSTECTOMY  1995   COLONOSCOPY WITH PROPOFOL N/A 05/09/2021   Procedure: COLONOSCOPY WITH PROPOFOL;  Surgeon: [name], MD;  Location: [facility] ENDOSCOPY;  Service: Gastroenterology;  Laterality: N/A;   EYE SURGERY  01/2016   Repair retinal tear   FOOT SURGERY  1995  / 1996   x2   HERNIA REPAIR     KNEE ARTHROSCOPY W/ MENISCAL REPAIR  07/25/2013   right knee Dr. Mardelle Matte   LAPAROSCOPIC GASTRIC SLEEVE RESECTION N/A 06/08/2013   Procedure: LAPAROSCOPIC GASTRIC SLEEVE RESECTION AND EXCISION OF SEBACEUS CYST FROM MID CHEST takedown of incarcerated ventral hernia and primary repair endoscopy;  Surgeon: [name], MD;  [facility];  Service: General;  Laterality: N/A;   LAPAROSCOPIC NISSEN FUNDOPLICATION N/A 63/78/5885   Procedure: LAPAROSCOPIC REPAIR LARGE SYMPTOMATIC HIATAL HERNIA WITH UPPER ENDOSCOPY;  Surgeon: Johnathan Hausen, MD;  Location: [facility];  Service: General;  Laterality: N/A;   moles  06/2013   removed 2 moles from under arm and  lowback   NECK SURGERY     occipital nerve damage- injections    OVARIAN CYST [place] EXCISIONAL BREAST BIOPSY Right 06/23/2021   Procedure: RADIOACTIVE SEED GUIDED EXCISIONAL RIGHT BREAST BIOPSY;  Surgeon: Johnathan Hausen, MD;  Location: [place];  Service: General;  Laterality: Right;   RETINAL TEAR REPAIR CRYOTHERAPY Right 01/2016   [place]  SKIN CANCER EXCISION     TONSILLECTOMY     TUBAL LIGATION  Think 1994   UPPER GI ENDOSCOPY  06/08/2013   Procedure: UPPER GI ENDOSCOPY;  Surgeon: [name], MD;  [facility];  Service: General;;   US ECHOCARDIOGRAPHY  12/2011   WNL - EF 55-60%, mild MR, grade 1 diastolic dysnfiction (mild)   VENTRAL HERNIA REPAIR  2000    Social History Social History   Tobacco Use   Smoking status: Never   Smokeless tobacco: Never   Tobacco comments:    smoke at age 34-12  Vaping Use   Vaping Use: Never used  Substance Use Topics   Alcohol use: Not Currently   Drug use: No    Family History Family History  Problem Relation Age of Onset   Breast cancer Mother    Diabetes Mother    Hypertension Mother    Kidney failure Mother    Diabetes Father    Hypertension Father     Diabetes Other    Hypertension Other    Stroke Other    Hemophilia Other     Allergies  Allergen Reactions   Wasp Venom Anaphylaxis   Chlorhexidine Gluconate     Itching under breasts   Metronidazole Other (See Comments)    Chest tightness, neck tightness   Prednisone Other (See Comments)    Severe migraine / with taper dose Told by Cardiologist to avoid due to Afib   Sodium Hypochlorite Rash    Liquid clorox bleach     REVIEW OF SYSTEMS (Negative unless checked)  Constitutional: '[]'$ Weight loss  '[]'$ Fever  '[]'$ Chills Cardiac: '[]'$ Chest pain   '[]'$ Chest pressure   '[]'$ Palpitations   '[]'$ Shortness of breath when laying flat   '[]'$ Shortness of breath with exertion. Vascular:  '[]'$ Pain in legs with walking   '[x]'$ Pain in legs with standing  '[]'$ History of DVT   '[]'$ Phlebitis   '[]'$ Swelling in legs   '[x]'$ Varicose veins   '[]'$ Non-healing ulcers Pulmonary:   '[]'$ Uses home oxygen   '[]'$ Productive cough   '[]'$ Hemoptysis   '[]'$ Wheeze  '[]'$ COPD   '[]'$ Asthma Neurologic:  '[]'$ Dizziness   '[]'$ Seizures   '[]'$ History of stroke   '[]'$ History of TIA  '[]'$ Aphasia   '[]'$ Vissual changes   '[]'$ Weakness or numbness in arm   '[]'$ Weakness or numbness in leg Musculoskeletal:   '[]'$ Joint swelling   '[]'$ Joint pain   '[]'$ Low back pain Hematologic:  '[]'$ Easy bruising  '[]'$ Easy bleeding   '[]'$ Hypercoagulable state   '[]'$ Anemic Gastrointestinal:  '[]'$ Diarrhea   '[]'$ Vomiting  '[]'$ Gastroesophageal reflux/heartburn   '[]'$ Difficulty swallowing. Genitourinary:  '[]'$ Chronic kidney disease   '[]'$ Difficult urination  '[]'$ Frequent urination   '[]'$ Blood in urine Skin:  '[]'$ Rashes   '[]'$ Ulcers  Psychological:  '[]'$ History of anxiety   '[]'$  History of major depression.  Physical Examination  There were no vitals filed for this visit. There is no height or weight on file to calculate BMI. Gen: WD/WN, NAD Head: [place]/AT, No temporalis wasting.  Ear/Nose/Throat: Hearing grossly intact, nares w/o erythema or drainage, pinna without lesions Eyes: PER, EOMI, sclera nonicteric.  Neck: Supple, no gross masses.  No JVD.   Pulmonary:  Good air movement, no audible wheezing, no use of accessory muscles.  Cardiac: RRR, precordium not hyperdynamic. Vascular:  Large varicosities present, greater than 10 mm ***.  Veins are tender to palpation  Mild venous stasis changes to the legs bilaterally.  Trace soft pitting edema CEAP C3sEpAsPr Vessel Right Left  Radial Palpable Palpable  Gastrointestinal: soft, non-distended. No guarding/no peritoneal signs.  Musculoskeletal:  M/S 5/5 throughout.  No deformity.  Neurologic: CN 2-12 intact. Pain and light touch intact in extremities.  Symmetrical.  Speech is fluent. Motor exam as listed above. Psychiatric: Judgment intact, Mood & affect appropriate for pt's clinical situation. Dermatologic: Venous rashes no ulcers noted.  No changes consistent with cellulitis. Lymph : No lichenification or skin changes of chronic lymphedema.  CBC Lab Results  Component Value Date   WBC 10.0 06/29/2022   HGB 12.0 06/29/2022   HCT 37.3 06/29/2022   MCV 85.6 06/29/2022   PLT 254 06/29/2022    BMET    Component Value Date/Time   NA 136 06/29/2022 0425   NA 139 01/21/2020 1137   K 3.1 (L) 06/29/2022 0425   CL 98 06/29/2022 0425   CO2 26 06/29/2022 0425   GLUCOSE 131 (H) 06/29/2022 0425   BUN 26 (H) 06/29/2022 0425   BUN 16 01/21/2020 1137   CREATININE 0.78 06/29/2022 0425   CREATININE 0.57 01/01/2020 1623   CALCIUM 9.3 06/29/2022 0425   GFRNONAA >60 06/29/2022 0425   GFRAA >60 02/02/2020 1417   CrCl cannot be calculated (Unknown ideal weight.).  COAG Lab Results  Component Value Date   INR 1.0 12/03/2007   INR 1.0 11/25/2007    Radiology MM 3D SCREEN BREAST BILATERAL  Result Date: 07/13/2022 CLINICAL DATA:  Screening. EXAM: DIGITAL SCREENING BILATERAL MAMMOGRAM WITH TOMOSYNTHESIS AND CAD TECHNIQUE: Bilateral screening digital craniocaudal and mediolateral oblique mammograms were obtained. Bilateral screening digital breast tomosynthesis was performed. The images were  evaluated with computer-aided detection. COMPARISON:  Previous exam(s). ACR Breast Density Category b: There are scattered areas of fibroglandular density. FINDINGS: There are no findings suspicious for malignancy. IMPRESSION: No mammographic evidence of malignancy. A result letter of this screening mammogram will be mailed directly to the patient. RECOMMENDATION: Screening mammogram in one year. (Code:SM-B-01Y) BI-RADS CATEGORY  1: Negative. Electronically Signed   By: Ammie Ferrier M.D.   On: 07/13/2022 11:49   DG ESOPHAGUS W DOUBLE CM (HD)  Result Date: 07/04/2022 CLINICAL DATA:  Odynophagia, history of hiatal hernia repair/revision, prior sleeve gastrectomy EXAM: ESOPHAGUS/BARIUM SWALLOW/TABLET STUDY TECHNIQUE: A limited, single contrast examination was performed using thin liquid barium and 1/2 inch barium tablet. Limited exam secondary to body habitus and clinical status. This exam was performed by Pasty Spillers, PA-C, and was supervised and interpreted by Kathreen Devoid, MD. FLUOROSCOPY: Radiation Exposure Index (as provided by the fluoroscopic device): 28.80 mGy Kerma COMPARISON:  UGI 12/30/19, 02/03/20. FINDINGS: Swallowing: Unremarkable. No vestibular penetration or aspiration seen. Pharynx: Unremarkable. Esophagus: Unremarkable course and caliber. No apparent mucosal lesion. Esophageal motility: Tertiary contractions present Hiatal Hernia: Small. Gastroesophageal reflux: Large volume reflux to the level of the thoracic inlet. Ingested 33m barium tablet: Passed normally through esophagus, but delayed in passage through the herniated portion of sleeve for greater than 2 minutes. Other: None. IMPRESSION: 1.  Severe gastroesophageal reflux. 2.  Mild esophageal dysmotility. 3.  Small residual hiatal hernia 4. Delayed passage of barium tablet into the abdominal portion of stomach Electronically Signed   By: HKathreen DevoidM.D.   On: 07/04/2022 12:36     Assessment/Plan There are no diagnoses linked to  this encounter.   GHortencia Pilar MD  07/14/2022 12:59 PM

## 2022-07-15 ENCOUNTER — Encounter: Payer: Self-pay | Admitting: Cardiology

## 2022-07-16 ENCOUNTER — Encounter (INDEPENDENT_AMBULATORY_CARE_PROVIDER_SITE_OTHER): Payer: Self-pay | Admitting: Vascular Surgery

## 2022-07-16 ENCOUNTER — Encounter: Payer: Self-pay | Admitting: Pulmonary Disease

## 2022-07-16 ENCOUNTER — Ambulatory Visit (INDEPENDENT_AMBULATORY_CARE_PROVIDER_SITE_OTHER): Payer: HMO | Admitting: Vascular Surgery

## 2022-07-16 ENCOUNTER — Ambulatory Visit: Payer: HMO | Attending: Family Medicine | Admitting: Occupational Therapy

## 2022-07-16 VITALS — BP 121/82 | HR 83 | Resp 16 | Wt 280.4 lb

## 2022-07-16 DIAGNOSIS — I872 Venous insufficiency (chronic) (peripheral): Secondary | ICD-10-CM

## 2022-07-16 DIAGNOSIS — I10 Essential (primary) hypertension: Secondary | ICD-10-CM

## 2022-07-16 DIAGNOSIS — I83893 Varicose veins of bilateral lower extremities with other complications: Secondary | ICD-10-CM

## 2022-07-16 DIAGNOSIS — I890 Lymphedema, not elsewhere classified: Secondary | ICD-10-CM | POA: Insufficient documentation

## 2022-07-16 DIAGNOSIS — I25118 Atherosclerotic heart disease of native coronary artery with other forms of angina pectoris: Secondary | ICD-10-CM

## 2022-07-16 DIAGNOSIS — I480 Paroxysmal atrial fibrillation: Secondary | ICD-10-CM | POA: Diagnosis not present

## 2022-07-16 MED ORDER — FLUTICASONE-SALMETEROL 250-50 MCG/ACT IN AEPB: 1.0000 | INHALATION_SPRAY | Freq: Two times a day (BID) | RESPIRATORY_TRACT | 2 refills | Status: DC

## 2022-07-16 NOTE — Telephone Encounter (Addendum)
Pt called stating she experienced episodes of chest pain x 2 days. She described symptom as a squeezing pain in mid chest area between her breast. Pt denies symptoms currently.  Pt reported she was recently evaluated for severe GERD and believes symptoms could be related, however pt stated she is concerned as symptoms occurred 2 days.  Appointment scheduled for 2/7 for further evaluations.

## 2022-07-16 NOTE — Telephone Encounter (Signed)
Can try Wixela 250/50, 1 inhalation twice a day, rinse mouth well after use.

## 2022-07-16 NOTE — Therapy (Signed)
OUTPATIENT OCCUPATIONAL THERAPY TREATMENT NOTE  LOWER EXTREMITY LYMPHEDEMA  Patient Name: Kimberly Patton MRN: [id number] DOB:28-Aug-1955, 67 y.o., female Today's Date: 07/16/2022  END OF SESSION:  OT End of Session - 07/16/22 0801     Visit Number 4    Number of Visits 36    Date for OT Re-Evaluation 09/30/22    OT Start Time 0801    OT Stop Time 0850    OT Time Calculation (min) 49 min    Activity Tolerance Patient tolerated treatment well;No increased pain    Behavior During Therapy [facility] for tasks assessed/performed               Past Medical History:  Diagnosis Date   ALLERGIC RHINITIS 04/03/2007   Allergy    Not sure   Anemia    ((Pt Qnr Sub: Denies at visit from 08/28/2021 with endocrinology))    Anginal pain ([place])    Anxiety    Arthritis Not sure   Asthma    hx of years ago - no longer a problem    ASTHMA, PERSISTENT, MODERATE 04/03/2007   Cancer ([place])    skin    Dyspnea on exertion    a. 11/2007 Echo: EF 60%.   Dysrhythmia    hx of heart arrhythmia 10-15 years ago - followed by DR Lovena Le - not seen in years    GERD (gastroesophageal reflux disease)    Hemophilia carrier    Hypercholesteremia 06/09/2019   Hypertension    Joint pain    HIPS / LEGS   Knee injury    RT   Meningioma ([place])    Morbid obesity ([place])    Neuromuscular disorder ([place]) Not sure, since my fall   Paroxysmal SVT (supraventricular tachycardia)    a. 11/2011 48h Holter: RSR, rare PVC's, occas PAC's   Pre-diabetes    Retinal tear of right eye 01/2016   Thyroid nodule 04/15/2018   Ventral hernia    Past Surgical History:  Procedure Laterality Date   [place]   ARTERY BIOPSY Left 03/21/2022   Procedure: BIOPSY TEMPORAL ARTERY;  Surgeon: Katha Cabal, MD;  Location: [facility] ORS;  Service: Vascular;  Laterality: Left;   BIOPSY THYROID     08/2021   BREAST CYST EXCISION Right 1994   Benign   CESAREAN SECTION     x2   CHOLECYSTECTOMY   1995   COLONOSCOPY WITH PROPOFOL N/A 05/09/2021   Procedure: COLONOSCOPY WITH PROPOFOL;  Surgeon: [name], MD;  Location: [facility];  Service: Gastroenterology;  Laterality: N/A;   EYE SURGERY  01/2016   Repair retinal tear   FOOT SURGERY  1995 / 1996   x2   HERNIA REPAIR     KNEE ARTHROSCOPY W/ MENISCAL REPAIR  07/25/2013   right knee Dr. Mardelle Matte   LAPAROSCOPIC GASTRIC SLEEVE RESECTION N/A 06/08/2013   Procedure: LAPAROSCOPIC GASTRIC SLEEVE RESECTION AND EXCISION OF SEBACEUS CYST FROM MID CHEST takedown of incarcerated ventral hernia and primary repair endoscopy;  Surgeon: [name], MD;  Location: [facility];  Service: General;  Laterality: N/A;   LAPAROSCOPIC NISSEN FUNDOPLICATION N/A 89/38/1017   Procedure: LAPAROSCOPIC REPAIR LARGE SYMPTOMATIC HIATAL HERNIA WITH UPPER ENDOSCOPY;  Surgeon: Johnathan Hausen, MD;  Location: [facility];  Service: General;  Laterality: N/A;   moles  06/2013   removed 2 moles from under arm and  lowback   NECK SURGERY     occipital nerve damage- injections  OVARIAN CYST REMOVAL  1970   PILONIDAL CYST EXCISION  1975   RADIOACTIVE SEED GUIDED EXCISIONAL BREAST BIOPSY Right 06/23/2021   Procedure: RADIOACTIVE SEED GUIDED EXCISIONAL RIGHT BREAST BIOPSY;  Surgeon: Johnathan Hausen, MD;  Location: [facility];  Service: General;  Laterality: Right;   RETINAL TEAR REPAIR CRYOTHERAPY Right 01/2016   [place]   SKIN CANCER EXCISION     TONSILLECTOMY     TUBAL LIGATION  Think 1994   UPPER GI ENDOSCOPY  06/08/2013   Procedure: UPPER GI ENDOSCOPY;  Surgeon: [name], MD;  Location: [facility];  Service: General;;   US ECHOCARDIOGRAPHY  12/2011   WNL - EF 55-60%, mild MR, grade 1 diastolic dysnfiction (mild)   VENTRAL HERNIA REPAIR  2000   Patient Active Problem List   Diagnosis Date Noted   COVID-19 virus infection 04/30/2022   Paroxysmal atrial fibrillation ([place]) 03/28/2022   Empty sella syndrome ([place]) 03/28/2022   Varicose veins  with inflammation 01/07/2022   S/P breast lumpectomy 10/10/2021   Cervical radiculopathy 06/28/2020   Chronic venous insufficiency 05/29/2020   Lymphedema 05/29/2020   CAD (coronary artery disease) 05/29/2020   Cervical lymphadenitis 04/18/2020   History of repair of hiatal hernia 02/02/2020   Orthostatic hypotension 01/01/2020   Hypercholesteremia 06/09/2019   Chronic neuropathic pain 07/31/2018   Thyroid nodule 04/15/2018   Skin lesion of scalp 09/06/2017   Age-related osteoporosis without current pathological fracture 12/04/2016   Osteoarthritis of knee 07/17/2016   Meningioma ([place]) 01/18/2016   Family history of aortic stenosis 01/20/2015   Status post laparoscopic sleeve gastrectomy Dec 2014 06/08/2013   Morbid obesity ([place]) 12/08/2012   Primary hypertension 10/11/2011   Varicose veins of bilateral lower extremities with other complications 03/55/9741   FIBROIDS, UTERUS 04/03/2007   Asthma, mild intermittent 04/03/2007    PCP: [name], MD  REFERRING PROVIDER: [name], MD  REFERRING DIAG: I89.0  THERAPY DIAG:  Lymphedema, not elsewhere classified  Rationale for Evaluation and Treatment: Rehabilitation  ONSET DATE: 07/02/22  SUBJECTIVE:                                                                                                                                                                                           SUBJECTIVE STATEMENT:Kimberly Patton PRESENTS FOR to Rx VISIT TO ADDRESS BLE LYMPHEDEMA. Pt reports she tolerated compression wraps over the weekend, but had difficulty keeping bandages from sliding down. Pt reports some " burning" pain with wraps over R tibia over the weekend. She denies LE-related pain this morning, but does endorse "heaviness" in her legs and pain in both knees rated 4/10/  Pt presents with multilayer wraps in place from ankle to popliteal on R leg. Foot is omitted as established. Wraps are significantly displaced distally. Pt is  agreeable to completing  "intake" FOTO and LLIS assessments this morning.  PERTINENT HISTORY: HTN, MORBID OBESITY (BMI- 46.26), Afib, CVI, Varicose veins w/ inflammation, Periferal neuropathy, Asthma, OA , Dyspnea on Exertion (EF 60%), SVT, knee injury, Gastric sleeve 2014, C section, Pre-diabetes, Thyroid mass  PAIN:  Are you having pain? Endorses "heaviness", but denies LE- related pain.  Pt tates chronic B knee pain as 4/10: NPRS scale: 0/10 Pain location: R knee, B legs Pain description: sore, heavy, full, tired Aggravating factors: standing, walking, dependent sitting Relieving factors: elevation  PRECAUTIONS: Other: LYMPHEDEMA PRECAUTIONS: Thyroid, cardiac, pulmonary  FALLS  SINCE LAST VISIT: No  HAND DOMINANCE: right   PRIOR LEVEL OF FUNCTION:  Modified independent w/ extra time  PATIENT GOALS: reduce swelling, limit LE progression   OBJECTIVE:  OBSERVATIONS / OTHER ASSESSMENTS:  Mild, Stage  II, Bilateral Lower Extremity Lymphedema 2/2 CVI and Morbid Obesity (suspect Lipedema component)    BLE COMPARATIVE LIMB VOLUMETRICS  LANDMARK INITIAL RIGHT  07/06/22  R LEG (A-D) 9622.4 ml  R THIGH (E-G) 11079.5 ml  R FULL LIMB (A-G) 20702.0 ml  Limb Volume differential (LVD)   R dominant LVD Leg = 4.6%, R>L; LVD thigh= 11079.5%, R>L; LVD full  limb 8.4%, R>L%  Volume change since initial %  Volume change overall V  (Blank rows = not tested)  LANDMARK INITIAL LLE 07/06/22  L LEG (A-D) 9183.3 ml  L THIGH (E-G) 9771.3 ml  RLE Full LIMB (A-G) 18957 ml  Limb Volume differential (LVD)  N/A  Volume change since initial %  Volume change overall %  (Blank rows = not tested  LYMPHEDEMA LIFE IMPACT SCALE (LLIS): Intake: 51.47% (The extent to which LE-related problems affected your life in the last week)  FOTO functional outcomes measure: Intake :Intake  56 %  Height: 5'6" Weight: 286.9 lb BMI: 46.26  TODAY'S TREATMENT:                                                                                                                                           OT Rx : Intensive Phase CDT RLE  PATIENT EDUCATION:  Education details: Continued Pt/ CG edu for lymphedema self care home program throughout session. Topics include outcome of comparative limb volumetrics- starting limb volume differentials (LVDs), technology and gradient techniques used for short stretch, multilayer compression wrapping, simple self-MLD, therapeutic lymphatic pumping exercises, skin/nail care, LE precautions,. compression garment recommendations and specifications, wear and care schedule and compression garment donning / doffing w assistive devices. Discussed progress towards all OT goals since commencing CDT. All questions answered to the Pt's satisfaction. Good return. Person educated: Patient Education method: Explanation, Demonstration, and Verbal cues, handouts Education comprehension: verbalized understanding, returned demonstration, and needs further education  RLE/RLQ MLD:  utilizing MODIFIED short neck sequence, (OMITTING LATERAL NECK STROKES in keeping with LE precautions), diaphragmatic breathing to activate deep abdominal lymphatics and thoracic duct, in tact, functional regional inguinal LN at R groin, and  proximal to distal J strokes to thigh, knee and leg. Sequence completed with retrograde sweeps of RLE and modified short neck. Pt mastered diaphragmatic breathing after skilled teaching.  RLE knee length multilayer gradient compression wraps as established. Foot omitted.  LYMPHEDEMA SELF CARE HOME PROGRAM: BLE Lymphatic Pumping There ex Skin care to limit infection risk Compression Intensive stage compression: multilayer short stretch wraps with gradient techniques: One each 8, 10 and 12 cm wide by 5 M long short stretch compression wrap applied circumferentially using staggered, gradient technique over single layer of .04 cm Rosidal foam and cotton stockinett.  Foot omitted. Self  Management Phase Compression: Custom, flat knit,  BLE compression knee highs. Consider Elvarex classic,  ccl 2, OT, Oblique SB, Tricot patch; BLE Jobst Relax for HOS Simple self-MLD. Consider advanced Flexitouch device to assist w long term self management at home   Custom-made gradient compression garments and HOS devices are medically necessary in this case because they are uniquely sized and shaped to fit the exact dimensions of the affected extremities with deformities, and to provide accurate and consistent gradient compression essential to optimally managing this patient's symptoms of chronic, progressive Lipo-lymphedema. Multiple custom compression garments are needed for optimal hygiene to limit infection risk. Custom compression garments should be replaced q 3-6 months When worn consistently for optimal lipo-lymphedema self-management over time.   ASSESSMENT:  CLINICAL IMPRESSION: Initial LLIS assessment reveals an intake score of 51.47%, which is indicative of the extent to which LE-related problem;ems affected Pt's life over the course of the last week. Initial FOTO functional outcome assessment reveals an Intake score of 56%.  Pt had some burning pain with compression between visits. She practiced bandage application several times. This is most likely neuropathic. Legs continue to feel heavy.  Commenced RLE/RLQ MLD, which she tolerated well. Provided skilled teaching re MLD throughout session discussing lymphatic structure and function related to MLD, purpose and several manual techniques. Pt mastered diaphragmatic breathing after skilled teaching No compression fater session as Pt has vascular appointment immediately after OT. .Cont as per POC.    OBJECTIVE IMPAIRMENTS: Abnormal gait, decreased balance, decreased knowledge of condition, decreased knowledge of use of DME, decreased mobility, decreased ROM, decreased strength, increased edema, postural dysfunction, obesity, and pain.  Increased infection risk 2/2 chronic progressive lymphedema in BLE  ACTIVITY LIMITATIONS: bending, sitting, standing, squatting, stairs, transfers, bathing, dressing, hygiene/grooming, and Reaching feet to bathe , groom nails, inspect skin , perform skin care; difficulty fitting preferred street shoes and LB clothing 2/2 swelling, impaired home management- shopping, cleaning, yard work, long distance driving causes swelling to worsen;   standing to prep food and cook, social participation requiring standing, walking, dependent sitting > 30 minutes results in increased pain and swelling  PERSONAL FACTORS: Age, Time since onset of injury/illness/exacerbation, and 3+ comorbidities: (HTN, Afib, , morbid obesity, CVI)  are also affecting patient's functional outcome.   REHAB POTENTIAL: Good  EVALUATION COMPLEXITY: Moderate   GOALS: Goals reviewed with patient? Yes  SHORT TERM GOALS: Target date: 4TH to Rx VISIT   Pt will demonstrate understanding of lymphedema precautions and prevention strategies with modified independence using a printed reference to identify at least 5 precautions and discussing how s/he may implement them into daily life to reduce risk of progression  with modified assistance. Baseline:Max A Goal status: INITIAL  2.  Pt will demonstrate understanding of obesity-related lymphedema and verbalize understanding that weight loss is the critical first step in the lymphedema plan of care for reducing limb volume, infection risk, and progression. Baseline: Max A Goal status: INITIAL  3.   With Max caregiver assistance Pt will be able to apply multilayer, knee length, multilayer, compression wraps using gradient techniques to decrease limb volume, to limit infection risk, and to limit lymphedema progression.  Baseline: Dependent Goal status: INITIAL  07/10/22 Goal Modified: Pt able to apply knee length wraps with min A and extra time today after edu. No caregiver needed to assist.     LONG TERM GOALS: Target date: 09/30/22  Given this patient's Intake score of 56 /100% on the functional outcomes FOTO tool, patient will experience an increase in function of 3 points to improve basic and instrumental ADLs performance, including lymphedema self-care. (TBA at first OT Rx visit) Baseline: Max A Goal status: INITIAL  2.  Given this patient's Intake score of  51% on the Lymphedema Life Impact Scale (LLIS), patient will experience a reduction of at least 5% in her perceived level of functional impairment resulting from lymphedema to improve functional performance and quality of life (QOL). Baseline: Max A Goal status: INITIAL  3.  Pt will achieve at least a 10% volume reductions bilaterally below the knees to return limb to more typical size and shape, to limit infection risk and LE progression, to decrease pain, to improve function, and to improve body image and QOL. Baseline: Dependent Goal status: INITIAL  4.  Pt will achieve and sustain at least 85% consistent compliance with all 4 lymphedema self-care home program components throughout Intensive Phase of CDT to achieve optimal limb volume reduction, limit infection risk, and limit LE progression. Baseline: Dependent Goal status: INITIAL  5.  Pt will be able to don and doff custom     compression garments using assistive devices to achieve optimal LE self management over time. Baseline: Dependent Goal status: INITIAL   PLAN:  PT FREQUENCY: 2x/week  PT DURATION: 12 weeks and PRN  PLANNED INTERVENTIONS: Therapeutic exercises, Therapeutic activity, Patient/Family education, Self Care, DME instructions, Manual lymph drainage, Compression bandaging, Manual therapy, and skin care, custom compression garment measurement and fitting, consider trial with advanced Flexitouch sequential pneumatic compression device if OK's by MD  PLAN FOR NEXT SESSION:  Cont RLE MLD with simultaneous skin care Multilayer, short stretch,   knee length compression wrapping using gradient techniques from distal ankle to popliteal fossa to RLE. Ongoing Pt edu for LE self care  Andrey Spearman, MS, OTR/L, CLT-LANA 07/16/22 8:56 AM

## 2022-07-16 NOTE — Telephone Encounter (Signed)
Dr. Patsey Berthold, please see attachment. Symbicort is not affordable. Patient is needing an alternative.

## 2022-07-17 ENCOUNTER — Encounter: Payer: Self-pay | Admitting: Internal Medicine

## 2022-07-17 NOTE — Progress Notes (Unsigned)
Cardiology Office Note    Date:  07/18/2022   ID:  Kimberly Patton, Kimberly Patton Oct 27, 1955, MRN [id number]  PCP:  Jinny Sanders, MD  Cardiologist:  [name], MD  Electrophysiologist:  None   Chief Complaint: Chest pain  History of Present Illness:   Kimberly Patton is a 67 y.o. female with history of nonobstructive CAD by coronary CTA in 09/2021, lone A-fib, paroxysmal SVT, diastolic dysfunction, HTN, iron deficiency anemia, hiatal hernia, asthma, severe GERD, and obesity who presents for evaluation of chest pain.  Remote echo in 2013 showed an EF of 55 to 65%, normal wall motion, grade 1 diastolic dysfunction, mild mitral regurgitation, and normal RV systolic function.  Nuclear stress test at that time showed no evidence of ischemia.  Echo from 01/2020 showed an EF of 60 to 65%, no regional wall motion abnormalities, grade 1 diastolic dysfunction, normal RV systolic function and ventricular cavity size, and mildly elevated PASP estimated at 40.5 mmHg.  Nuclear stress test at that time showed no evidence of ischemia with a normal LVSF and was overall low risk.  No significant aortic atherosclerosis or coronary artery calcification was identified.  Outpatient cardiac monitoring in 03/2020 showed a predominant rhythm of sinus with an average rate of 71 bpm (range 43 to 193 bpm), 1 run of NSVT lasting 4 beats, occasional episodes of SVT were identified and associated with patient triggered events.  PFTs in 01/2021 consistent with moderate asthma.   She was seen in 09/2021 with intermittent right-sided chest discomfort and exertional dyspnea.  Coronary CTA at that time showed a calcium score of 59.8 which was the 76 percentile with nonobstructive CAD involving the proximal LAD (<25%), RCA (<25%), and LCx (minimal stenosis).  Echo at that time showed an EF of 55 to 60%, no regional wall motion abnormalities, normal LV diastolic function parameters, normal RV systolic function and ventricular cavity  size, and trivial mitral regurgitation.  Following temporal artery biopsy and prednisone use in 03/2022, she was noted to have a brief episode of A-fib with spontaneous conversion to sinus rhythm.  Subsequent outpatient cardiac monitoring showed a predominant rhythm of sinus with an average rate of 74 bpm, 13 episodes of SVT with the longest interval lasting 14 beats, and no evidence of A-fib.  Given this was a lone episode, no anticoagulation was indicated.  She was seen in the ED in 06/2022 with fever and bodyaches following Reclast infusion.  ED note indicates tachycardia.  EKG unavailable for review.  Vital signs show heart rate of 92 to 93 bpm.  High-sensitivity troponin negative x 2.  She was hypokalemic with a potassium of 3.1 and given supplemental potassium.  Tachycardia improved with IV fluids.  She contacted our office earlier this month noting episodes of sharp, squeezing chest pain that would last from 2 to 4 minutes for 2 days.  Given symptoms, appointment was scheduled for today.  She comes in today noting 3 episodes of substernal/epigastric discomfort that have occurred randomly.  Discomfort radiates underneath the right breast.  Symptoms are nonexertional.  Symptoms would last for several minutes followed by spontaneous resolution.  She reports that she was evaluated by GI for symptoms and was told her her GERD is uncontrolled and "severe."  She also has a history of prior hiatal hernia status postsurgical repair with residual small hernia.  Currently without symptoms of chest pain.    Labs independently reviewed: 06/2022 - TC 138, TG 82, HDL 61, LDL 61, potassium 3.1, BUN  26, serum creatinine 0.78, Hgb 12.0, PLT 254 05/2022 - A1c 6.0, albumin 4.2, AST/ALT normal 08/2021 - TSH normal  Past Medical History:  Diagnosis Date   ALLERGIC RHINITIS 04/03/2007   Allergy    Not sure   Anemia    ((Pt Qnr Sub: Denies at visit from 08/28/2021 with endocrinology))    Anginal pain ([place])     Anxiety    Arthritis Not sure   Asthma    hx of years ago - no longer a problem    ASTHMA, PERSISTENT, MODERATE 04/03/2007   Cancer ([place])    skin    Dyspnea on exertion    a. 11/2007 Echo: EF 60%.   Dysrhythmia    hx of heart arrhythmia 10-15 years ago - followed by DR Lovena Le - not seen in years    GERD (gastroesophageal reflux disease)    Hemophilia carrier    Hypercholesteremia 06/09/2019   Hypertension    Joint pain    HIPS / LEGS   Knee injury    RT   Meningioma ([place])    Morbid obesity ([place])    Neuromuscular disorder ([place]) Not sure, since my fall   Paroxysmal SVT (supraventricular tachycardia)    a. 11/2011 48h Holter: RSR, rare PVC's, occas PAC's   Pre-diabetes    Retinal tear of right eye 01/2016   Thyroid nodule 04/15/2018   Ventral hernia     Past Surgical History:  Procedure Laterality Date   [place]   ARTERY BIOPSY Left 03/21/2022   Procedure: BIOPSY TEMPORAL ARTERY;  Surgeon: Katha Cabal, MD;  Location: [facility] ORS;  Service: Vascular;  Laterality: Left;   BIOPSY THYROID     08/2021   BREAST CYST EXCISION Right 1994   Benign   CESAREAN SECTION     x2   CHOLECYSTECTOMY  1995   COLONOSCOPY WITH PROPOFOL N/A 05/09/2021   Procedure: COLONOSCOPY WITH PROPOFOL;  Surgeon: [name], MD;  Location: [facility] END[facility]COPY;  Service: Gastroenterology;  Laterality: N/A;   EYE SURGERY  01/2016   Repair retinal tear   FOOT SURGERY  1995 / 1996   x2   HERNIA REPAIR     KNEE ARTHROSCOPY W/ MENISCAL REPAIR  07/25/2013   right knee Dr. Mardelle Matte   LAPAROSCOPIC GASTRIC SLEEVE RESECTION N/A 06/08/2013   Procedure: LAPAROSCOPIC GASTRIC SLEEVE RESECTION AND EXCISION OF SEBACEUS CYST FROM MID CHEST takedown of incarcerated ventral hernia and primary repair endoscopy;  Surgeon: [name], MD;  Location: [facility] ORS;  Service: General;  Laterality: N/A;   LAPAROSCOPIC NISSEN FUNDOPLICATION N/A 83/15/1761   Procedure: LAPAROSCOPIC  REPAIR LARGE SYMPTOMATIC HIATAL HERNIA WITH UPPER ENDOSCOPY;  Surgeon: Johnathan Hausen, MD;  Location: [facility] ORS;  Service: General;  Laterality: N/A;   moles  06/2013   removed 2 moles from under arm and  lowback   NECK SURGERY     occipital nerve damage- injections    OVARIAN CYST [place] EXCISIONAL BREAST BIOPSY Right 06/23/2021   Procedure: RADIOACTIVE SEED GUIDED EXCISIONAL RIGHT BREAST BIOPSY;  Surgeon: Johnathan Hausen, MD;  Location: [place];  Service: General;  Laterality: Right;   RETINAL TEAR REPAIR CRYOTHERAPY Right 01/2016   [place]   SKIN CANCER EXCISION     TONSILLECTOMY     TUBAL LIGATION  Think 1994   UPPER GI ENDOSCOPY  06/08/2013   Procedure: UPPER GI ENDOSCOPY;  Surgeon: [name], MD;  Location: [facility] ORS;  Service: General;;   US ECHOCARDIOGRAPHY  12/2011   WNL - EF 55-60%, mild MR, grade 1 diastolic dysnfiction (mild)   VENTRAL HERNIA REPAIR  2000    Current Medications: Current Meds  Medication Sig   albuterol (VENTOLIN HFA) 108 (90 Base) MCG/ACT inhaler Inhale 2 puffs into the lungs every 6 (six) hours as needed for wheezing or shortness of breath.   Calcium Citrate-Vitamin D (CITRACAL + D PO) Take 2 tablets by mouth daily.    CARDIZEM 30 MG tablet Take 1 tablet (30 mg total) by mouth daily as needed (for elevated Heart rate).   Cholecalciferol (VITAMIN D3) 1.25 MG (50000 UT) CAPS Take 1 capsule by mouth once a week. (Patient taking differently: Take 1 capsule by mouth once a week. Tuesday)   Coenzyme Q10-Vitamin E (QUNOL ULTRA COQ10) 100-150 MG-UNIT CAPS Take 100 mg by mouth daily.   Cyanocobalamin (VITAMIN B-12) 5000 MCG SUBL Place 5,000 mcg under the tongue daily.   diltiazem (CARDIZEM SR) 120 MG 12 hr capsule Take 1 capsule (120 mg total) by mouth 2 (two) times daily.   ELDERBERRY PO Take 1,000 mg by mouth daily.    famotidine (PEPCID) 40 MG tablet Take 40 mg by mouth at  bedtime.   gabapentin (NEURONTIN) 100 MG capsule Take 200 mg by mouth daily as needed (for neck pain (nerve pain)).   gabapentin (NEURONTIN) 300 MG capsule Take 300 mg by mouth daily.   hydrochlorothiazide (HYDRODIURIL) 25 MG tablet Take 1 tablet (25 mg total) by mouth daily.   Lactobacillus Rhamnosus, GG, (CULTURELLE IMMUNITY SUPPORT PO) Take 1 capsule by mouth daily.   Menaquinone-7 (VITAMIN K2 PO) Take 1 tablet by mouth daily.   methocarbamol (ROBAXIN) 500 MG tablet Take 500 mg by mouth at bedtime as needed for muscle spasms.   montelukast (SINGULAIR) 10 MG tablet TAKE 1 TABLET BY MOUTH EVERYDAY AT BEDTIME (Patient taking differently: Take 10 mg by mouth at bedtime.)   Multiple Vitamins-Minerals (CENTRUM SILVER 50+WOMEN PO) Take 1 tablet by mouth daily.   nitroGLYCERIN (NITROSTAT) 0.4 MG SL tablet Place 1 tablet (0.4 mg total) under the tongue every 5 (five) minutes as needed for chest pain.   Omega-3 Fatty Acids (FISH OIL) 1200 MG CAPS Take 1 capsule by mouth daily.   pantoprazole (PROTONIX) 40 MG tablet TAKE 1 TABLET BY MOUTH EVERY DAY (Patient taking differently: Take 40 mg by mouth daily.)   Riboflavin 100 MG CAPS Take 1 capsule by mouth daily.   rosuvastatin (CRESTOR) 10 MG tablet Take 1 tablet (10 mg total) by mouth daily.   Semaglutide-Weight Management 0.25 MG/0.5ML SOAJ Inject 0.25 mg into the skin once a week.   TURMERIC PO Take 2 tablets by mouth daily. With Ginger   Zoledronic Acid (RECLAST IV) Inject into the vein. Yearly injection    Allergies:   Wasp venom, Chlorhexidine gluconate, Metronidazole, Prednisone, and Sodium hypochlorite   Social History   Socioeconomic History   Marital status: Divorced    Spouse name: Not on file   Number of children: 2   Years of education: 17   Highest education level: Not on file  Occupational History   Occupation: Product manager: [employer]  Tobacco Use   Smoking status: Never   Smokeless tobacco: Never   Tobacco  comments:    smoke at age [age]  Vaping Use   Vaping Use: Never used  Substance and Sexual Activity  Alcohol use: Not Currently   Drug use: No   Sexual activity: Not Currently    Partners: Male    Birth control/protection: Abstinence, Pill  Other Topics Concern   Not on file  Social History Narrative   Lives at home alone.   Right-handed.   Occasional caffeine use.   Social Determinants of Health   Financial Resource Strain: Low Risk  (05/08/2022)   Overall Financial Resource Strain (CARDIA)    Difficulty of Paying Living Expenses: Not hard at all  Food Insecurity: No Food Insecurity (05/08/2022)   Hunger Vital Sign    Worried About Running Out of Food in the Last Year: Never true    Ran Out of Food in the Last Year: Never true  Transportation Needs: No Transportation Needs (05/08/2022)   PRAPARE - Hydrologist (Medical): No    Lack of Transportation (Non-Medical): No  Physical Activity: Inactive (05/08/2022)   Exercise Vital Sign    Days of Exercise per Week: 0 days    Minutes of Exercise per Session: 0 min  Stress: No Stress Concern Present (05/08/2022)   [place]    Feeling of Stress : Not at all  Social Connections: Moderately Integrated (05/08/2022)   Social Connection and Isolation Panel [NHANES]    Frequency of Communication with Friends and Family: More than three times a week    Frequency of Social Gatherings with Friends and Family: More than three times a week    Attends Religious Services: More than 4 times per year    Active Member of [employer] or Organizations: Yes    Attends Music therapist: More than 4 times per year    Marital Status: Divorced     Family History:  The patient's family history includes Breast cancer in her mother; Diabetes in her father, mother, and another family member; Hemophilia in an other family member; Hypertension in her  father, mother, and another family member; Kidney failure in her mother; Stroke in an other family member.  ROS:   12-point review of systems is negative unless otherwise noted in HPI.   EKGs/Labs/Other Studies Reviewed:    Studies reviewed were summarized above. The additional studies were reviewed today:  Zio patch 03/2022: Patient had a min HR of 44 bpm, max HR of 197 bpm, and avg HR of 74 bpm. Predominant underlying rhythm was Sinus Rhythm. 13 Supraventricular Tachycardia runs occurred, the run with the fastest interval lasting 5 beats with a max rate of 197 bpm, the  longest lasting 14 beats with an avg rate of 155 bpm. Some episodes of Supraventricular Tachycardia may be possible Atrial Tachycardia with variable block. Isolated SVEs were rare (<1.0%), SVE Couplets were rare (<1.0%), and SVE Triplets were rare  (<1.0%). Isolated VEs were rare (<1.0%, 292), VE Couplets were rare (<1.0%, 8), and VE Triplets were rare (<1.0%, 1).   Conclusion Paroxysmal SVT, no evidence of A-fib noted Continue meds as prescribed __________  2D echo 09/28/2021: 1. Left ventricular ejection fraction, by estimation, is 55 to 60%. The  left ventricle has normal function. The left ventricle has no regional  wall motion abnormalities. Left ventricular diastolic parameters were  normal. The average left ventricular  global longitudinal strain is -20.5 %. The global longitudinal strain is  normal.   2. Right ventricular systolic function is normal. The right ventricular  size is normal.   3. The mitral valve is normal in structure.  Trivial mitral valve  regurgitation.   4. The aortic valve is tricuspid. Aortic valve regurgitation is not  visualized.   5. The inferior vena cava is normal in size with greater than 50%  respiratory variability, suggesting right atrial pressure of 3 mmHg. __________   Coronary CTA 09/21/2021: FINDINGS: Aorta:  Normal size.  No calcifications.  No dissection.   Aortic  Valve:  Trileaflet.  No calcifications.   Coronary Arteries:  Normal coronary origin.  Right dominance.   RCA is a dominant artery that gives rise to PDA and PLA. There is calcified plaque proximally causing minimal stenosis (<25%).   Left main is a large artery that gives rise to LAD and LCX arteries. LM has no disease.   LAD has calcified plaque proximally causing minimal stenosis (<25%).   LCX is a non-dominant artery that gives rise to two obtuse marginal branches. There is calcified plaque in the proximal LCx causing minimal stenosis.   Other findings:   Normal pulmonary vein drainage into the left atrium.   Normal left atrial appendage without a thrombus.   Normal size of the pulmonary artery.   IMPRESSION: 1. Coronary calcium score of 59.8. This was 76th percentile for age and sex matched control.   2. Normal coronary origin with right dominance.   3. Calcified plaque causing minimal stenosis in the proximal LAD, RCA, LCx arteries.   4. CAD-RADS 1. Minimal non-obstructive CAD (0-24%). Consider non-atherosclerotic causes of chest pain. Consider preventive therapy and risk factor modification. __________   Elwyn Reach patch 03/2020: Patient had a min HR of 43 bpm, max HR of 193 bpm, and avg HR of 71 bpm. Predominant underlying rhythm was Sinus Rhythm. 1 Ventricular Tachycardia run occurred, lasting 4 beats. Occasional Supraventricular Tachycardia runs occurred,  associated with patient triggered events. Supraventricular Tachycardia was detected  within +/- 45 seconds of symptomatic patient event(s). __________   Carlton Adam MPI 01/2020: There was no ST segment deviation noted during stress. No T wave inversion was noted during stress. The study is normal. This is a low risk study. The left ventricular ejection fraction is normal (55-65%). No significant aortic or coronary artery calcifications __________   2D echo 01/21/2020: 1. Left ventricular ejection fraction, by  estimation, is 60 to 65%. The  left ventricle has normal function. The left ventricle has no regional  wall motion abnormalities. Left ventricular diastolic parameters are  consistent with Grade I diastolic  dysfunction (impaired relaxation). The average left ventricular global  longitudinal strain is -20.4 %.   2. Right ventricular systolic function is normal. The right ventricular  size is normal. There is mildly elevated pulmonary artery systolic  pressure. The estimated right ventricular systolic pressure is 92.1 mmHg.   Comparison(s): EF 55-65%. __________   See Epic for remaining remote cardiac studies   EKG:  EKG is ordered today.  The EKG ordered today demonstrates NSR, 81 bpm, incomplete RBBB consistent with prior tracing  Recent Labs: 08/28/2021: TSH 0.85 05/11/2022: ALT 14 06/29/2022: BUN 26; Creatinine, Ser 0.78; Hemoglobin 12.0; Platelets 254; Potassium 3.1; Sodium 136  Recent Lipid Panel    Component Value Date/Time   CHOL 138 07/11/2022 0659   CHOL 168 07/31/2021 0905   TRIG 82 07/11/2022 0659   HDL 61 07/11/2022 0659   HDL 67 07/31/2021 0905   CHOLHDL 2.3 07/11/2022 0659   VLDL 16 07/11/2022 0659   LDLCALC 61 07/11/2022 0659   LDLCALC 85 07/31/2021 0905    PHYSICAL EXAM:    VS:  BP 134/86 (BP Location: Left Arm, Patient Position: Sitting, Cuff Size: Normal) Comment (BP Location): forearm  Pulse 81   Ht '5\' 6"'$  (1.676 m)   Wt 280 lb 3.2 oz (127.1 kg)   SpO2 98%   BMI 45.23 kg/m   BMI: Body mass index is 45.23 kg/m.  Physical Exam Vitals reviewed.  Constitutional:      Appearance: She is well-developed.  HENT:     Head: Normocephalic and atraumatic.  Eyes:     General:        Right eye: No discharge.        Left eye: No discharge.  Neck:     Vascular: No JVD.  Cardiovascular:     Rate and Rhythm: Normal rate and regular rhythm.     Heart sounds: Normal heart sounds, S1 normal and S2 normal. Heart sounds not distant. No midsystolic click and no  opening snap. No murmur heard.    No friction rub.  Pulmonary:     Effort: Pulmonary effort is normal. No respiratory distress.     Breath sounds: Normal breath sounds. No decreased breath sounds, wheezing or rales.  Chest:     Chest wall: No tenderness.  Abdominal:     General: There is no distension.  Musculoskeletal:     Cervical back: Normal range of motion.  Skin:    General: Skin is warm and dry.     Nails: There is no clubbing.  Neurological:     Mental Status: She is alert and oriented to person, place, and time.  Psychiatric:        Speech: Speech normal.        Behavior: Behavior normal.        Thought Content: Thought content normal.        Judgment: Judgment normal.      Wt Readings from Last 3 Encounters:  07/18/22 280 lb 3.2 oz (127.1 kg)  07/16/22 280 lb 6.4 oz (127.2 kg)  06/29/22 286 lb 9.6 oz (130 kg)     ASSESSMENT & PLAN:   Nonobstructive CAD with other forms of angina: Currently chest pain-free.  Recent coronary CTA showed mild nonobstructive CAD with less than 25% stenosis involving the LAD and RCA.  Given reassuring recent coronary CTA, suspect symptoms are last likely cardiac in etiology and more so related to flareup of GERD.  She would like to pursue treatment course as outlined by GI for GERD over the next several weeks and if symptoms persist despite improvement in GERD management, she would consider repeat cardiac evaluation at that time.  Given reassuring coronary CTA less than 12 months ago, and with noted improvement in lifestyle modification this is reasonable.  Lone A-fib: Occurred in the setting of temporal artery biopsy and prednisone use.  No further evidence of recurrent arrhythmia.  Given this, she has not been maintained on [place].  Should she have documented recurrence of A-fib, anticoagulation would need to be revisited at that time.  Paroxysmal SVT: Quiescent.  She remains on long-acting diltiazem.  HTN: Blood pressure is reasonably  controlled in the office today.  HLD: LDL 61 in 06/2022.  She remains on rosuvastatin 10 mg.  She has significantly improved lifestyle.  Obesity: She has been working hard on lifestyle modification.  Congratulations were offered.  GERD: Ongoing management per GI.   Disposition: F/u with Dr. Garen Lah or an APP in 8 weeks.   Medication Adjustments/Labs and Tests Ordered: Current medicines are reviewed at length with the patient  today.  Concerns regarding medicines are outlined above. Medication changes, Labs and Tests ordered today are summarized above and listed in the Patient Instructions accessible in Encounters.   SignedChristell Faith, PA-C 07/18/2022 1:07 PM     [address] [place], [place] [address] [phone]

## 2022-07-18 ENCOUNTER — Ambulatory Visit: Payer: HMO | Attending: Physician Assistant | Admitting: Physician Assistant

## 2022-07-18 ENCOUNTER — Ambulatory Visit: Payer: HMO | Admitting: Occupational Therapy

## 2022-07-18 ENCOUNTER — Encounter: Payer: Self-pay | Admitting: Occupational Therapy

## 2022-07-18 ENCOUNTER — Encounter: Payer: Self-pay | Admitting: Physician Assistant

## 2022-07-18 VITALS — BP 134/86 | HR 81 | Ht 66.0 in | Wt 280.2 lb

## 2022-07-18 DIAGNOSIS — Z9889 Other specified postprocedural states: Secondary | ICD-10-CM | POA: Diagnosis not present

## 2022-07-18 DIAGNOSIS — Z9884 Bariatric surgery status: Secondary | ICD-10-CM | POA: Diagnosis not present

## 2022-07-18 DIAGNOSIS — K224 Dyskinesia of esophagus: Secondary | ICD-10-CM | POA: Diagnosis not present

## 2022-07-18 DIAGNOSIS — I471 Supraventricular tachycardia, unspecified: Secondary | ICD-10-CM

## 2022-07-18 DIAGNOSIS — Z6841 Body Mass Index (BMI) 40.0 and over, adult: Secondary | ICD-10-CM

## 2022-07-18 DIAGNOSIS — K449 Diaphragmatic hernia without obstruction or gangrene: Secondary | ICD-10-CM | POA: Diagnosis not present

## 2022-07-18 DIAGNOSIS — I4891 Unspecified atrial fibrillation: Secondary | ICD-10-CM | POA: Diagnosis not present

## 2022-07-18 DIAGNOSIS — I890 Lymphedema, not elsewhere classified: Secondary | ICD-10-CM

## 2022-07-18 DIAGNOSIS — K219 Gastro-esophageal reflux disease without esophagitis: Secondary | ICD-10-CM | POA: Diagnosis not present

## 2022-07-18 DIAGNOSIS — I10 Essential (primary) hypertension: Secondary | ICD-10-CM | POA: Diagnosis not present

## 2022-07-18 DIAGNOSIS — I2510 Atherosclerotic heart disease of native coronary artery without angina pectoris: Secondary | ICD-10-CM

## 2022-07-18 DIAGNOSIS — E785 Hyperlipidemia, unspecified: Secondary | ICD-10-CM

## 2022-07-18 DIAGNOSIS — Z8719 Personal history of other diseases of the digestive system: Secondary | ICD-10-CM | POA: Diagnosis not present

## 2022-07-18 NOTE — Therapy (Signed)
OUTPATIENT OCCUPATIONAL THERAPY TREATMENT NOTE  LOWER EXTREMITY LYMPHEDEMA  Patient Name: Kimberly Patton MRN: [id number] DOB:[birth year], 67 y.o., female Today's Date: 07/18/2022  END OF SESSION:  OT End of Session - 07/18/22 0806     Visit Number 5    Number of Visits 36    Date for OT Re-Evaluation 09/30/22    OT Start Time 0800    OT Stop Time 0910    OT Time Calculation (min) 70 min    Activity Tolerance Patient tolerated treatment well;No increased pain    Behavior During Therapy [facility] for tasks assessed/performed               Past Medical History:  Diagnosis Date   ALLERGIC RHINITIS 04/03/2007   Allergy    Not sure   Anemia    ((Pt Qnr Sub: Denies at visit from 08/28/2021 with endocrinology))    Anginal pain ([place])    Anxiety    Arthritis Not sure   Asthma    hx of years ago - no longer a problem    ASTHMA, PERSISTENT, MODERATE 04/03/2007   Cancer ([place])    skin    Dyspnea on exertion    a. 11/2007 Echo: EF 60%.   Dysrhythmia    hx of heart arrhythmia 10-15 years ago - followed by DR Lovena Le - not seen in years    GERD (gastroesophageal reflux disease)    Hemophilia carrier    Hypercholesteremia 06/09/2019   Hypertension    Joint pain    HIPS / LEGS   Knee injury    RT   Meningioma ([place])    Morbid obesity ([place])    Neuromuscular disorder ([place]) Not sure, since my fall   Paroxysmal SVT (supraventricular tachycardia)    a. 11/2011 48h Holter: RSR, rare PVC's, occas PAC's   Pre-diabetes    Retinal tear of right eye 01/2016   Thyroid nodule 04/15/2018   Ventral hernia    Past Surgical History:  Procedure Laterality Date   [place]   ARTERY BIOPSY Left 03/21/2022   Procedure: BIOPSY TEMPORAL ARTERY;  Surgeon: Katha Cabal, MD;  Location: [facility] ORS;  Service: Vascular;  Laterality: Left;   BIOPSY THYROID     08/2021   BREAST CYST EXCISION Right 1994   Benign   CESAREAN SECTION     x2   CHOLECYSTECTOMY   1995   COLONOSCOPY WITH PROPOFOL N/A 05/09/2021   Procedure: COLONOSCOPY WITH PROPOFOL;  Surgeon: [name], MD;  Location: [facility] ENDOSCOPY;  Service: Gastroenterology;  Laterality: N/A;   EYE SURGERY  01/2016   Repair retinal tear   FOOT SURGERY  1995 / 1996   x2   HERNIA REPAIR     KNEE ARTHROSCOPY W/ MENISCAL REPAIR  07/25/2013   right knee Dr. Mardelle Matte   LAPAROSCOPIC GASTRIC SLEEVE RESECTION N/A 06/08/2013   Procedure: LAPAROSCOPIC GASTRIC SLEEVE RESECTION AND EXCISION OF SEBACEUS CYST FROM MID CHEST takedown of incarcerated ventral hernia and primary repair endoscopy;  Surgeon: [name], MD;  Location: [facility] ORS;  Service: General;  Laterality: N/A;   LAPAROSCOPIC NISSEN FUNDOPLICATION N/A 84/16/6063   Procedure: LAPAROSCOPIC REPAIR LARGE SYMPTOMATIC HIATAL HERNIA WITH UPPER ENDOSCOPY;  Surgeon: Johnathan Hausen, MD;  Location: [facility] ORS;  Service: General;  Laterality: N/A;   moles  06/2013   removed 2 moles from under arm and  lowback   NECK SURGERY     occipital nerve damage- injections  OVARIAN CYST REMOVAL  1970   PILONIDAL CYST EXCISION  1975   RADIOACTIVE SEED GUIDED EXCISIONAL BREAST BIOPSY Right 06/23/2021   Procedure: RADIOACTIVE SEED GUIDED EXCISIONAL RIGHT BREAST BIOPSY;  Surgeon: Johnathan Hausen, MD;  Location: [place];  Service: General;  Laterality: Right;   RETINAL TEAR REPAIR CRYOTHERAPY Right 01/2016   [place]   SKIN CANCER EXCISION     TONSILLECTOMY     TUBAL LIGATION  Think 1994   UPPER GI ENDOSCOPY  06/08/2013   Procedure: UPPER GI ENDOSCOPY;  Surgeon: [name], MD;  Location: [facility] ORS;  Service: General;;   US ECHOCARDIOGRAPHY  12/2011   WNL - EF 55-60%, mild MR, grade 1 diastolic dysnfiction (mild)   VENTRAL HERNIA REPAIR  2000   Patient Active Problem List   Diagnosis Date Noted   COVID-19 virus infection 04/30/2022   Paroxysmal atrial fibrillation ([place]) 03/28/2022   Empty sella syndrome ([place]) 03/28/2022   Varicose veins  with inflammation 01/07/2022   S/P breast lumpectomy 10/10/2021   Cervical radiculopathy 06/28/2020   Chronic venous insufficiency 05/29/2020   Lymphedema 05/29/2020   CAD (coronary artery disease) 05/29/2020   Cervical lymphadenitis 04/18/2020   History of repair of hiatal hernia 02/02/2020   Orthostatic hypotension 01/01/2020   Hypercholesteremia 06/09/2019   Chronic neuropathic pain 07/31/2018   Thyroid nodule 04/15/2018   Skin lesion of scalp 09/06/2017   Age-related osteoporosis without current pathological fracture 12/04/2016   Osteoarthritis of knee 07/17/2016   Meningioma ([place]) 01/18/2016   Family history of aortic stenosis 01/20/2015   Status post laparoscopic sleeve gastrectomy Dec 2014 06/08/2013   Morbid obesity ([place]) 12/08/2012   Primary hypertension 10/11/2011   Varicose veins of bilateral lower extremities with other complications 18/84/1660   FIBROIDS, UTERUS 04/03/2007   Asthma, mild intermittent 04/03/2007    PCP: [name], MD  REFERRING PROVIDER: [name], MD  REFERRING DIAG: I89.0  THERAPY DIAG:  Lymphedema, not elsewhere classified  Rationale for Evaluation and Treatment: Rehabilitation  ONSET DATE: 07/02/22  SUBJECTIVE:                                                                                                                                                                                           SUBJECTIVE STATEMENT:Kimberly Patton PRESENTS FOR to Rx VISIT TO ADDRESS BLE LYMPHEDEMA. Pt reports she did not tolerate multilayer   , gradient compression wraps last night. She reports, "deep, aching pain, almost like it's in my bones, overnight, and I had to take them off. The also won't stay up. The pain was in my calf. My legs are so sore today". Pt  reports 4/10 leg pain bilaterally this morning. Pt presents with multilayer wraps in place.    PERTINENT HISTORY: HTN, MORBID OBESITY (BMI- 46.26), Afib, CVI, Varicose veins w/ inflammation,  Periferal neuropathy, Asthma, OA , Dyspnea on Exertion (EF 60%), SVT, knee injury, Gastric sleeve 2014, C section, Pre-diabetes, Thyroid mass, varicose veins  PAIN:  Are you having pain? Yes.  Pt rates chronic calf pain  4/10: NPRS scale: 0/10 Pain location:bilateral Pain description: sore, achy, tired Aggravating factors: standing, walking, dependent sitting Relieving factors: elevation  PRECAUTIONS: Other: LYMPHEDEMA PRECAUTIONS: Thyroid, cardiac, pulmonary  FALLS  SINCE LAST VISIT: No  HAND DOMINANCE: right   PRIOR LEVEL OF FUNCTION:  Modified independent w/ extra time  PATIENT GOALS: reduce swelling, limit LE progression   OBJECTIVE:  OBSERVATIONS / OTHER ASSESSMENTS:  Mild, Stage  II, Bilateral Lower Extremity Lymphedema 2/2 CVI and Morbid Obesity (suspect Lipedema component)    BLE COMPARATIVE LIMB VOLUMETRICS  LANDMARK INITIAL RIGHT  07/06/22  R LEG (A-D) 9622.4 ml  R THIGH (E-G) 11079.5 ml  R FULL LIMB (A-G) 20702.0 ml  Limb Volume differential (LVD)   R dominant LVD Leg = 4.6%, R>L; LVD thigh= 11079.5%, R>L; LVD full  limb 8.4%, R>L%  Volume change since initial %  Volume change overall V  (Blank rows = not tested)  LANDMARK INITIAL LLE 07/06/22  L LEG (A-D) 9183.3 ml  L THIGH (E-G) 9771.3 ml  RLE Full LIMB (A-G) 18957 ml  Limb Volume differential (LVD)  N/A  Volume change since initial %  Volume change overall %  (Blank rows = not tested  LYMPHEDEMA LIFE IMPACT SCALE (LLIS): Intake: 51.47% (The extent to which LE-related problems affected your life in the last week)  FOTO functional outcomes measure: Intake :Intake  56 %  Height: 5'6" Weight: 286.9 lb BMI: 46.26  TODAY'S TREATMENT:                                                                                                                                          OT Rx : Intensive Phase CDT RLE  PATIENT EDUCATION:  Education details: Continued Pt/ CG edu for lymphedema self care home program  throughout session. Topics include outcome of comparative limb volumetrics- starting limb volume differentials (LVDs), technology and gradient techniques used for short stretch, multilayer compression wrapping, simple self-MLD, therapeutic lymphatic pumping exercises, skin/nail care, LE precautions,. compression garment recommendations and specifications, wear and care schedule and compression garment donning / doffing w assistive devices. Discussed progress towards all OT goals since commencing CDT. All questions answered to the Pt's satisfaction. Good return. Person educated: Patient Education method: Explanation, Demonstration, and Verbal cues, handouts Education comprehension: verbalized understanding, returned demonstration, and needs further education  NO RLE/RLQ MLD TODAY DUE TO CHANGE OF PLAN: Completed anatomical measurements for daytime and night time compression garments and devices. Faxed to DME vendor after session.  (RLE/RLQ MLD:  utilizing MODIFIED short neck sequence, (OMITTING LATERAL NECK STROKES in keeping with LE precautions), diaphragmatic breathing to activate deep abdominal lymphatics and thoracic duct, in tact, functional regional inguinal LN at R groin, and  proximal to distal J strokes to thigh, knee and leg. Sequence completed with retrograde sweeps of RLE and modified short neck. Pt mastered diaphragmatic breathing after skilled teaching.)  RLE knee length multilayer gradient compression wraps as established. Foot omitted.  LYMPHEDEMA SELF CARE HOME PROGRAM: BLE Lymphatic Pumping There ex Skin care to limit infection risk Compression Intensive stage compression: multilayer short stretch wraps with gradient techniques: One each 8, 10 and 12 cm wide by 5 M long short stretch compression wrap applied circumferentially using staggered, gradient technique over single layer of .04 cm Rosidal foam and cotton stockinett.  Foot omitted. Self Management Phase Compression: Custom,  flat knit,  BLE compression knee highs. Consider Elvarex classic,  ccl 2, OT, Oblique SB, Tricot patch; BLE Jobst Relax for HOS Simple self-MLD. Consider advanced Flexitouch device to assist w long term self management at home   Custom-made gradient compression garments and HOS devices are medically necessary in this case because they are uniquely sized and shaped to fit the exact dimensions of the affected extremities with deformities, and to provide accurate and consistent gradient compression essential to optimally managing this patient's symptoms of chronic, progressive Lipo-lymphedema. Multiple custom compression garments are needed for optimal hygiene to limit infection risk. Custom compression garments should be replaced q 3-6 months When worn consistently for optimal lipo-lymphedema self-management over time.   ASSESSMENT:  CLINICAL IMPRESSION: Pt opting to jump ahead and measure for compression garments since bandaging has been painful. After skilled teaching for recommendations and options, Pt decided to try the alternative Velcro wrap style CircAids Juxtafit essentials paired with knee length Jobst RELAX, convoluted knee highs  to facilitate improved lymphatic function and decrease fibrosis during HOS. She agrees to keep working with compression bandaging and not give up on CDT yet. We'll recommence wrapping next visit while modifying for comfort.   OBJECTIVE IMPAIRMENTS: Abnormal gait, decreased balance, decreased knowledge of condition, decreased knowledge of use of DME, decreased mobility, decreased ROM, decreased strength, increased edema, postural dysfunction, obesity, and pain. Increased infection risk 2/2 chronic progressive lymphedema in BLE  ACTIVITY LIMITATIONS: bending, sitting, standing, squatting, stairs, transfers, bathing, dressing, hygiene/grooming, and Reaching feet to bathe , groom nails, inspect skin , perform skin care; difficulty fitting preferred street shoes and LB  clothing 2/2 swelling, impaired home management- shopping, cleaning, yard work, long distance driving causes swelling to worsen;   standing to prep food and cook, social participation requiring standing, walking, dependent sitting > 30 minutes results in increased pain and swelling  PERSONAL FACTORS: Age, Time since onset of injury/illness/exacerbation, and 3+ comorbidities: (HTN, Afib, , morbid obesity, CVI)  are also affecting patient's functional outcome.   REHAB POTENTIAL: Good  EVALUATION COMPLEXITY: Moderate   GOALS: Goals reviewed with patient? Yes  SHORT TERM GOALS: Target date: 4TH to Rx VISIT   Pt will demonstrate understanding of lymphedema precautions and prevention strategies with modified independence using a printed reference to identify at least 5 precautions and discussing how s/he may implement them into daily life to reduce risk of progression with modified assistance. Baseline:Max A Goal status: INITIAL  2.  Pt will demonstrate understanding of obesity-related lymphedema and verbalize understanding that weight loss is the critical first step in the lymphedema plan of care for reducing limb volume, infection risk, and progression.  Baseline: Max A Goal status: INITIAL  3.   With Max caregiver assistance Pt will be able to apply multilayer, knee length, multilayer, compression wraps using gradient techniques to decrease limb volume, to limit infection risk, and to limit lymphedema progression.  Baseline: Dependent Goal status: INITIAL  07/10/22 Goal Modified: Pt able to apply knee length wraps with min A and extra time today after edu. No caregiver needed to assist.    LONG TERM GOALS: Target date: 09/30/22  Given this patient's Intake score of 56 /100% on the functional outcomes FOTO tool, patient will experience an increase in function of 3 points to improve basic and instrumental ADLs performance, including lymphedema self-care. (TBA at first OT Rx visit) Baseline:  Max A Goal status: INITIAL  2.  Given this patient's Intake score of  51% on the Lymphedema Life Impact Scale (LLIS), patient will experience a reduction of at least 5% in her perceived level of functional impairment resulting from lymphedema to improve functional performance and quality of life (QOL). Baseline: Max A Goal status: INITIAL  3.  Pt will achieve at least a 10% volume reductions bilaterally below the knees to return limb to more typical size and shape, to limit infection risk and LE progression, to decrease pain, to improve function, and to improve body image and QOL. Baseline: Dependent Goal status: INITIAL  4.  Pt will achieve and sustain at least 85% consistent compliance with all 4 lymphedema self-care home program components throughout Intensive Phase of CDT to achieve optimal limb volume reduction, limit infection risk, and limit LE progression. Baseline: Dependent Goal status: INITIAL  5.  Pt will be able to don and doff custom     compression garments using assistive devices to achieve optimal LE self management over time. Baseline: Dependent Goal status: INITIAL   PLAN:  PT FREQUENCY: 2x/week  PT DURATION: 12 weeks and PRN  PLANNED INTERVENTIONS: Therapeutic exercises, Therapeutic activity, Patient/Family education, Self Care, DME instructions, Manual lymph drainage, Compression bandaging, Manual therapy, and skin care, custom compression garment measurement and fitting, consider trial with advanced Flexitouch sequential pneumatic compression device if OK's by MD  PLAN FOR NEXT SESSION:  Cont RLE MLD with simultaneous skin care Multilayer, short stretch,  knee length compression wrapping using gradient techniques from distal ankle to popliteal fossa to RLE. Back off on compression  in effort to improve comfort. Ongoing Pt edu for LE self care  Andrey Spearman, MS, OTR/L, CLT-LANA 07/18/22 10:49 AM

## 2022-07-18 NOTE — Patient Instructions (Signed)
Medication Instructions:  No changes at this time.   *If you need a refill on your cardiac medications before your next appointment, please call your pharmacy*   Lab Work: None  If you have labs (blood work) drawn today and your tests are completely normal, you will receive your results only by: [place] (if you have MyChart) OR A paper copy in the mail If you have any lab test that is abnormal or we need to change your treatment, we will call you to review the results.   Testing/Procedures: None   Follow-Up: At [facility], you and your health needs are our priority.  As part of our continuing mission to provide you with exceptional heart care, we have created designated Provider Care Teams.  These Care Teams include your primary Cardiologist (physician) and Advanced Practice Providers (APPs -  Physician Assistants and Nurse Practitioners) who all work together to provide you with the care you need, when you need it.   Your next appointment:   8 week(s)  Provider:   [name], MD or Christell Faith, PA-C

## 2022-07-19 ENCOUNTER — Other Ambulatory Visit: Payer: Self-pay

## 2022-07-19 ENCOUNTER — Encounter: Payer: Self-pay | Admitting: Cardiology

## 2022-07-19 ENCOUNTER — Encounter: Payer: Self-pay | Admitting: Emergency Medicine

## 2022-07-19 ENCOUNTER — Ambulatory Visit
Admission: EM | Admit: 2022-07-19 | Discharge: 2022-07-19 | Disposition: A | Discharge status: Home / Self Care | Payer: HMO | Attending: Urgent Care | Admitting: Urgent Care

## 2022-07-19 DIAGNOSIS — R000 Tachycardia, unspecified: Secondary | ICD-10-CM

## 2022-07-19 DIAGNOSIS — R079 Chest pain, unspecified: Secondary | ICD-10-CM | POA: Diagnosis not present

## 2022-07-19 DIAGNOSIS — I25118 Atherosclerotic heart disease of native coronary artery with other forms of angina pectoris: Secondary | ICD-10-CM

## 2022-07-19 NOTE — Discharge Instructions (Addendum)
Please follow-up with your primary care and cardiologist regarding your symptoms.  Go to the ED if you continue to have concerning symptoms including elevated heart rate accompanied by chest pain.  BP 151/82 Temp 98 F HR 114 bpm SaO2 98%

## 2022-07-19 NOTE — ED Provider Notes (Addendum)
Kimberly Patton    CSN: [id number] Arrival date & time: 07/19/22  1652      History   Chief Complaint No chief complaint on file.   HPI Kimberly Patton is a 67 y.o. female.   HPI  Patient presents to urgent care with complaint of elevated heart rate as well as accompanying chest pain.  She states she felt chest pain earlier today and took a nitroglycerin pill.  She states that she was at cardiology yesterday and had a "clean bill of health".  She denies history of cardiac related issues and states she is only treated for high blood pressure, she later reports having history of angina.  She took an extra tablet of diltiazem to attempt to lower her blood pressure.    Past Medical History:  Diagnosis Date   ALLERGIC RHINITIS 04/03/2007   Allergy    Not sure   Anemia    ((Pt Qnr Sub: Denies at visit from 08/28/2021 with endocrinology))    Anginal pain ([place])    Anxiety    Arthritis Not sure   Asthma    hx of years ago - no longer a problem    ASTHMA, PERSISTENT, MODERATE 04/03/2007   Cancer ([place])    skin    Dyspnea on exertion    a. 11/2007 Echo: EF 60%.   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 11/2011 48h Holter: RSR, rare PVC's, occas PAC's   Pre-diabetes    Retinal tear of right eye 01/2016   Thyroid nodule 04/15/2018   Ventral hernia     Patient Active Problem List   Diagnosis Date Noted   COVID-19 virus infection 04/30/2022   Paroxysmal atrial fibrillation ([place]) 03/28/2022   Empty sella syndrome ([place]) 03/28/2022   Varicose veins with inflammation 01/07/2022   S/P breast lumpectomy  10/10/2021   Cervical radiculopathy 06/28/2020   Chronic venous insufficiency 05/29/2020   Lymphedema 05/29/2020   CAD (coronary artery disease) 05/29/2020   Cervical lymphadenitis 04/18/2020   History of repair of hiatal hernia 02/02/2020   Orthostatic hypotension 01/01/2020   Hypercholesteremia 06/09/2019   Chronic neuropathic pain 07/31/2018   Thyroid nodule 04/15/2018   Skin lesion of scalp 09/06/2017   Age-related osteoporosis without current pathological fracture 12/04/2016   Osteoarthritis of knee 07/17/2016   Meningioma ([place]) 01/18/2016   Family history of aortic stenosis 01/20/2015   Status post laparoscopic sleeve gastrectomy Dec 2014 06/08/2013   Morbid obesity ([place]) 12/08/2012   Primary hypertension 10/11/2011   Varicose veins of bilateral lower extremities with other complications 89/38/1017   FIBROIDS, UTERUS 04/03/2007   Asthma, mild intermittent 04/03/2007    Past Surgical History:  Procedure Laterality Date   ABDOMINAL HYSTERECTOMY  [place]   ARTERY BIOPSY Left 03/21/2022   Procedure: BIOPSY TEMPORAL ARTERY;  Surgeon: Katha Cabal, MD;  Location: [facility] ORS;  Service: Vascular;  Laterality: Left;   BIOPSY THYROID     08/2021   BREAST CYST EXCISION Right 1994  Benign   CESAREAN SECTION     x2   CHOLECYSTECTOMY  1995   COLONOSCOPY WITH PROPOFOL N/A 05/09/2021   Procedure: COLONOSCOPY WITH PROPOFOL;  Surgeon: [name], MD;  Location: [facility] ENDOSCOPY;  Service: Gastroenterology;  Laterality: N/A;   EYE SURGERY  01/2016   Repair retinal tear   FOOT SURGERY  1995 / 1996   x2   HERNIA REPAIR     KNEE ARTHROSCOPY W/ MENISCAL REPAIR  07/25/2013   right knee Dr. Mardelle Matte   LAPAROSCOPIC GASTRIC SLEEVE RESECTION N/A 06/08/2013   Procedure: LAPAROSCOPIC GASTRIC SLEEVE RESECTION AND EXCISION OF SEBACEUS CYST FROM MID CHEST takedown of incarcerated ventral hernia and primary repair endoscopy;  Surgeon: [name], MD;  Location: [facility]  ORS;  Service: General;  Laterality: N/A;   LAPAROSCOPIC NISSEN FUNDOPLICATION N/A 64/68/0321   Procedure: LAPAROSCOPIC REPAIR LARGE SYMPTOMATIC HIATAL HERNIA WITH UPPER ENDOSCOPY;  Surgeon: Johnathan Hausen, MD;  Location: [facility] ORS;  Service: General;  Laterality: N/A;   moles  06/2013   removed 2 moles from under arm and  lowback   NECK SURGERY     occipital nerve damage- injections    OVARIAN CYST [place] EXCISIONAL BREAST BIOPSY Right 06/23/2021   Procedure: RADIOACTIVE SEED GUIDED EXCISIONAL RIGHT BREAST BIOPSY;  Surgeon: Johnathan Hausen, MD;  Location: [facility];  Service: General;  Laterality: Right;   RETINAL TEAR REPAIR CRYOTHERAPY Right 01/2016   [place]   SKIN CANCER EXCISION     TONSILLECTOMY     TUBAL LIGATION  [place] 1994   UPPER GI ENDOSCOPY  06/08/2013   Procedure: UPPER GI ENDOSCOPY;  Surgeon: [name], MD;  Location: [facility] ORS;  Service: General;;   US ECHOCARDIOGRAPHY  12/2011   WNL - EF 55-60%, mild MR, grade 1 diastolic dysnfiction (mild)   VENTRAL HERNIA REPAIR  2000    OB History     Gravida  3   Para  2   Term  2   Preterm      AB  1   Living  2      SAB  1   IAB      Ectopic      Multiple      Live Births  2            Home Medications    Prior to Admission medications   Medication Sig Start Date End Date Taking? Authorizing Provider  albuterol (VENTOLIN HFA) 108 (90 Base) MCG/ACT inhaler Inhale 2 puffs into the lungs every 6 (six) hours as needed for wheezing or shortness of breath. 10/10/21   Parrett, Fonnie Mu, NP  Calcium Citrate-Vitamin D (CITRACAL + D PO) Take 2 tablets by mouth daily.     [provider]  CARDIZEM 30 MG tablet Take 1 tablet (30 mg total) by mouth daily as needed (for elevated Heart rate). 05/17/22   [name], MD  Cholecalciferol (VITAMIN D3) 1.25 MG (50000 UT) CAPS Take 1 capsule by mouth once a week. Patient  taking differently: Take 1 capsule by mouth once a week. Tuesday 09/05/21   Jinny Sanders, MD  Coenzyme Q10-Vitamin E (QUNOL ULTRA COQ10) 100-150 MG-UNIT CAPS Take 100 mg by mouth daily.    [provider]  Cyanocobalamin (VITAMIN B-12) 5000 MCG SUBL Place 5,000 mcg under the tongue daily.    [provider]  diltiazem (CARDIZEM SR) 120 MG 12 hr  capsule Take 1 capsule (120 mg total) by mouth 2 (two) times daily. 05/18/22   [name], MD  ELDERBERRY PO Take 1,000 mg by mouth daily.     [provider]  famotidine (PEPCID) 40 MG tablet Take 40 mg by mouth at bedtime.    [provider]  fluticasone (FLONASE) 50 MCG/ACT nasal spray SPRAY 2 SPRAYS INTO EACH NOSTRIL EVERY DAY Patient not taking: Reported on 07/18/2022 10/15/21   Jinny Sanders, MD  fluticasone-salmeterol (WIXELA INHUB) 250-50 MCG/ACT AEPB Inhale 1 puff into the lungs in the morning and at bedtime. Patient not taking: Reported on 07/18/2022 07/16/22   Tyler Pita, MD  gabapentin (NEURONTIN) 100 MG capsule Take 200 mg by mouth daily as needed (for neck pain (nerve pain)).    [provider]  gabapentin (NEURONTIN) 300 MG capsule Take 300 mg by mouth daily. 02/23/21   [provider]  hydrochlorothiazide (HYDRODIURIL) 25 MG tablet Take 1 tablet (25 mg total) by mouth daily. 05/17/22 05/12/23  [name], MD  Lactobacillus Rhamnosus, GG, (CULTURELLE IMMUNITY SUPPORT PO) Take 1 capsule by mouth daily.    [provider]  Menaquinone-7 (VITAMIN K2 PO) Take 1 tablet by mouth daily.    [provider]  methocarbamol (ROBAXIN) 500 MG tablet Take 500 mg by mouth at bedtime as needed for muscle spasms. 06/14/22   [provider]  montelukast (SINGULAIR) 10 MG tablet TAKE 1 TABLET BY MOUTH EVERYDAY AT BEDTIME Patient taking differently: Take 10 mg by mouth at bedtime. 03/27/22   Chesley Mires, MD  Multiple Vitamins-Minerals (CENTRUM SILVER 50+WOMEN PO) Take 1  tablet by mouth daily.    [provider]  nitroGLYCERIN (NITROSTAT) 0.4 MG SL tablet Place 1 tablet (0.4 mg total) under the tongue every 5 (five) minutes as needed for chest pain. 05/17/22   [name], MD  Omega-3 Fatty Acids (FISH OIL) 1200 MG CAPS Take 1 capsule by mouth daily.    [provider]  pantoprazole (PROTONIX) 40 MG tablet TAKE 1 TABLET BY MOUTH EVERY DAY Patient taking differently: Take 40 mg by mouth daily. 04/20/22   Bedsole, Amy E, MD  Riboflavin 100 MG CAPS Take 1 capsule by mouth daily. 07/26/21   [provider]  rosuvastatin (CRESTOR) 10 MG tablet Take 1 tablet (10 mg total) by mouth daily. 05/17/22   [name], MD  Semaglutide-Weight Management 0.25 MG/0.5ML SOAJ Inject 0.25 mg into the skin once a week.    [provider]  Semaglutide-Weight Management 0.5 MG/0.5ML SOAJ Inject 0.5 mg into the skin once a week for 28 days. Patient not taking: Reported on 06/29/2022 06/15/22 07/13/22  [name], MD  Semaglutide-Weight Management 1 MG/0.5ML SOAJ Inject 1 mg into the skin once a week for 28 days. Patient not taking: Reported on 07/18/2022 07/14/22 08/11/22  [name], MD  Semaglutide-Weight Management 1.7 MG/0.75ML SOAJ Inject 1.7 mg into the skin once a week for 28 days. Patient not taking: Reported on 07/18/2022 08/12/22 09/09/22  [name], MD  Semaglutide-Weight Management 2.4 MG/0.75ML SOAJ Inject 2.4 mg into the skin once a week for 28 days. Patient not taking: Reported on 07/18/2022 09/10/22 10/08/22  [name], MD  TURMERIC PO Take 2 tablets by mouth daily. With Ginger    [provider]  Zoledronic Acid (RECLAST IV) Inject into the vein. Yearly injection    [provider]    Family History Family History  Problem Relation Age of Onset   Breast cancer  Mother    Diabetes Mother    Hypertension Mother    Kidney failure Mother    Diabetes Father    Hypertension Father     Diabetes Other    Hypertension Other    Stroke Other    Hemophilia Other     Social History Social History   Tobacco Use   Smoking status: Never   Smokeless tobacco: Never   Tobacco comments:    smoke at age 60-12  Vaping Use   Vaping Use: Never used  Substance Use Topics   Alcohol use: Not Currently   Drug use: No     Allergies   Wasp venom, Chlorhexidine gluconate, Metronidazole, Prednisone, and Sodium hypochlorite   Review of Systems Review of Systems   Physical Exam Triage Vital Signs ED Triage Vitals [07/19/22 1659]  Enc Vitals Group     BP (!) 151/82     Pulse Rate (!) 114     Resp 18     Temp 98 F (36.7 C)     Temp Source Oral     SpO2 98 %     Weight      Height      Head Circumference      Peak Flow      Pain Score      Pain Loc      Pain Edu?      Excl. in [place]?    No data found.  Updated Vital Signs BP (!) 151/82 (BP Location: Left Arm)   Pulse (!) 114   Temp 98 F (36.7 C) (Oral)   Resp 18   SpO2 98%   Visual Acuity Right Eye Distance:   Left Eye Distance:   Bilateral Distance:    Right Eye Near:   Left Eye Near:    Bilateral Near:     Physical Exam Vitals reviewed.  Constitutional:      General: She is in acute distress.     Appearance: Normal appearance.  Cardiovascular:     Rate and Rhythm: Regular rhythm. Tachycardia present.     Pulses: Normal pulses.     Heart sounds: Normal heart sounds.  Pulmonary:     Effort: Pulmonary effort is normal.     Breath sounds: Normal breath sounds.  Neurological:     General: No focal deficit present.     Mental Status: She is alert and oriented to person, place, and time.  Psychiatric:        Mood and Affect: Mood is anxious.        Behavior: Behavior normal.      UC Treatments / Results  Labs (all labs ordered are listed, but only abnormal results are displayed) Labs Reviewed - No data to display  EKG   Radiology No results found.  Procedures Procedures (including  critical care time)  Medications Ordered in UC Medications - No data to display  Initial Impression / Assessment and Plan / UC Course  I have reviewed the triage vital signs and the nursing notes.  Pertinent labs & imaging results that were available during my care of the patient were reviewed by me and considered in my medical decision making (see chart for details).   Patient was brought back immediately upon presentation to the clinic given her report of chest pain.  ECG is reassuring with sinus tachycardia and no acute rhythm changes compared to ECG recorded yesterday at cardiology.  Patient endorses recent treatment with Reclast for osteoporosis and reports reaction to  it.  She was discharged with heart rate of 114 bpm and blood pressure with systolic 932.  I warned patient to go to ED if she continues to have elevated heart rate accompanied by chest pain.  Otherwise she should follow-up with her primary care and cardiology providers.   Final Clinical Impressions(s) / UC Diagnoses   Final diagnoses:  None   Discharge Instructions   None    ED Prescriptions   None    PDMP not reviewed this encounter.   Rose Phi, FNP 07/19/22 1714    Rose Phi, [place] 07/19/22 1718

## 2022-07-19 NOTE — Addendum Note (Signed)
Addended by: James Ivanoff D on: 07/19/2022 09:29 AM   Modules accepted: Orders

## 2022-07-19 NOTE — ED Triage Notes (Signed)
Patient had sudden chest tightness, patient is anxious.  Patient asks question in regards to a new medicine started 2 weeks ago.  Patient is alert, answering appropriate.  Skin warm and dry.

## 2022-07-20 ENCOUNTER — Encounter: Payer: Self-pay | Admitting: Physician Assistant

## 2022-07-20 ENCOUNTER — Telehealth: Payer: Self-pay

## 2022-07-20 ENCOUNTER — Telehealth: Payer: Self-pay | Admitting: Cardiology

## 2022-07-20 ENCOUNTER — Ambulatory Visit: Payer: HMO | Attending: Physician Assistant | Admitting: Physician Assistant

## 2022-07-20 VITALS — BP 138/84 | HR 79 | Ht 66.0 in | Wt 279.2 lb

## 2022-07-20 DIAGNOSIS — M546 Pain in thoracic spine: Secondary | ICD-10-CM | POA: Diagnosis not present

## 2022-07-20 DIAGNOSIS — I471 Supraventricular tachycardia, unspecified: Secondary | ICD-10-CM

## 2022-07-20 DIAGNOSIS — I10 Essential (primary) hypertension: Secondary | ICD-10-CM

## 2022-07-20 DIAGNOSIS — E785 Hyperlipidemia, unspecified: Secondary | ICD-10-CM | POA: Diagnosis not present

## 2022-07-20 DIAGNOSIS — K219 Gastro-esophageal reflux disease without esophagitis: Secondary | ICD-10-CM | POA: Diagnosis not present

## 2022-07-20 DIAGNOSIS — I2510 Atherosclerotic heart disease of native coronary artery without angina pectoris: Secondary | ICD-10-CM | POA: Diagnosis not present

## 2022-07-20 DIAGNOSIS — Z6841 Body Mass Index (BMI) 40.0 and over, adult: Secondary | ICD-10-CM | POA: Diagnosis not present

## 2022-07-20 DIAGNOSIS — R002 Palpitations: Secondary | ICD-10-CM | POA: Diagnosis not present

## 2022-07-20 DIAGNOSIS — I4891 Unspecified atrial fibrillation: Secondary | ICD-10-CM | POA: Diagnosis not present

## 2022-07-20 NOTE — Telephone Encounter (Signed)
Pt c/o of Chest Pain: STAT if CP now or developed within 24 hours  1. Are you having CP right now? no  2. Are you experiencing any other symptoms (ex. SOB, nausea, vomiting, sweating)? no  3. How long have you been experiencing CP?  4 pm   4. Is your CP continuous or coming and going? Came and went  5. Have you taken Nitroglycerin? Yes,    STAT if HR is under 50 or over 120 (normal HR is 60-100 beats per minute)  What is your heart rate? 95  Do you have a log of your heart rate readings (document readings)? Went up to 178 last night  Do you have any other symptoms? Chest tightness last night    Patient states last night she was sitting watching TV and she started having chest tightness. She says it was also around her throat and shoulders. She says she checked her pulse and it was going up and down. She says it went up to 178 before she went to urgent care. She says when she went to urgent care they told her she had tachycardia and did an EKG. She says they did not send her to the ED, because they said there wasn't much more they could do for her. She says she did take a diltiazem and a nitroglycerin, which helped.  ?

## 2022-07-20 NOTE — Telephone Encounter (Signed)
Left message on patient's voicemail to call back

## 2022-07-20 NOTE — Telephone Encounter (Signed)
This patient had appointment on 2/7 and she is coming in today 2/9

## 2022-07-20 NOTE — Patient Instructions (Signed)
Pick up [employer]  Medication Instructions:  No changes at this time.   *If you need a refill on your cardiac medications before your next appointment, please call your pharmacy*   Lab Work: None  If you have labs (blood work) drawn today and your tests are completely normal, you will receive your results only by: [place] (if you have MyChart) OR A paper copy in the mail If you have any lab test that is abnormal or we need to change your treatment, we will call you to review the results.   Testing/Procedures: None   Follow-Up: At [facility], you and your health needs are our priority.  As part of our continuing mission to provide you with exceptional heart care, we have created designated Provider Care Teams.  These Care Teams include your primary Cardiologist (physician) and Advanced Practice Providers (APPs -  Physician Assistants and Nurse Practitioners) who all work together to provide you with the care you need, when you need it.   Your next appointment:   Keep scheduled appointment on 09/13/22 at 08:40 am  Provider:   [name], MD

## 2022-07-20 NOTE — Progress Notes (Signed)
Cardiology Office Note    Date:  07/20/2022   ID:  Kimberly Patton, Kimberly Patton 1955-11-07, MRN [id number]  PCP:  Jinny Sanders, MD  Cardiologist:  [name], MD  Electrophysiologist:  None   Chief Complaint: Palpitations  History of Present Illness:   Kimberly Patton is a 67 y.o. female with history of nonobstructive CAD by coronary CTA in 09/2021, lone A-fib, paroxysmal SVT, diastolic dysfunction, HTN, iron deficiency anemia, hiatal hernia, asthma, severe GERD, and obesity who presents for evaluation of palpitations.   Remote echo in 2013 showed an EF of 55 to 65%, normal wall motion, grade 1 diastolic dysfunction, mild mitral regurgitation, and normal RV systolic function.  Nuclear stress test at that time showed no evidence of ischemia.  Echo from 01/2020 showed an EF of 60 to 65%, no regional wall motion abnormalities, grade 1 diastolic dysfunction, normal RV systolic function and ventricular cavity size, and mildly elevated PASP estimated at 40.5 mmHg.  Nuclear stress test at that time showed no evidence of ischemia with a normal LVSF and was overall low risk.  No significant aortic atherosclerosis or coronary artery calcification was identified.  Outpatient cardiac monitoring in 03/2020 showed a predominant rhythm of sinus with an average rate of 71 bpm (range 43 to 193 bpm), 1 run of NSVT lasting 4 beats, occasional episodes of SVT were identified and associated with patient triggered events.  PFTs in 01/2021 consistent with moderate asthma.    She was seen in 09/2021 with intermittent right-sided chest discomfort and exertional dyspnea.  Coronary CTA at that time showed a calcium score of 59.8 which was the 76 percentile with nonobstructive CAD involving the proximal LAD (<25%), RCA (<25%), and LCx (minimal stenosis).  Echo at that time showed an EF of 55 to 60%, no regional wall motion abnormalities, normal LV diastolic function parameters, normal RV systolic function and ventricular  cavity size, and trivial mitral regurgitation.   Following temporal artery biopsy and prednisone use in 03/2022, she was noted to have a brief episode of A-fib with spontaneous conversion to sinus rhythm.  Subsequent outpatient cardiac monitoring showed a predominant rhythm of sinus with an average rate of 74 bpm, 13 episodes of SVT with the longest interval lasting 14 beats, and no evidence of A-fib.  Given this was a lone episode, no anticoagulation was indicated.   She was seen in the ED in 06/2022 with fever and bodyaches following Reclast infusion.  ED note indicates tachycardia.  EKG unavailable for review.  Vital signs show heart rate of 92 to 93 bpm.  High-sensitivity troponin negative x 2.  She was hypokalemic with a potassium of 3.1 and given supplemental potassium.  Tachycardia improved with IV fluids.  She contacted our office earlier this month noting episodes of sharp, squeezing chest pain that would last for 2 to 4 minutes.  She was recently seen in the office on 07/18/2018 for noting 3 episodes of substernal/epigastric discomfort that occurred randomly and radiated underneath the right breast.  Symptoms were nonexertional.  Symptoms would last for several minutes and spontaneously resolved.  She indicated she had been evaluated by GI for symptoms and was told her GERD was uncontrolled and "severe.".  She also reported a prior history of hiatal hernia status postsurgical repair with small residual hernia.  Given coronary CTA last in 12 months ago showed nonobstructive CAD, she elected to pursue treatment for GERD with plans for cardiology follow-up if symptoms persisted.  This was quite reasonable.  She was evaluated at a local urgent care on 07/19/2022 for onset of chest tightness in the upper chest with elevated heart rates in the 120s to 1 teens bpm BP was elevated at 178/94.  She took a as needed 30 mg diltiazem and SL NTG.  Workup in the urgent care showed sinus tachycardia, 102 bpm,  first-degree AV block, incomplete RBBB, and nonspecific changes.  Comes in today noting an episode of elevated heart rate in the 120s while sitting watching TV yesterday.  This was associated with neck tightness as well as bilateral upper shoulder discomfort.  No substernal chest pain, dizziness, presyncope, or syncope.  She was evaluated at a local urgent care with reassuring workup as outlined above.  Since this episode, she has been asymptomatic.  Shortly before these episodes, she had eaten a hot dog minus the bun from [employer] as well as nodular chips with pork.  Of note, she recently wore an outpatient cardiac monitor in 03/2022 which showed 3 episodes of SVT with the longest episode lasting just 14 beats.   Labs independently reviewed: 06/2022 - TC 138, TG 82, HDL 61, LDL 61, potassium 3.1, BUN 26, serum creatinine 0.78, Hgb 12.0, PLT 254 05/2022 - A1c 6.0, albumin 4.2, AST/ALT normal 08/2021 - TSH normal  Past Medical History:  Diagnosis Date   ALLERGIC RHINITIS 04/03/2007   Allergy    Not sure   Anemia    ((Pt Qnr Sub: Denies at visit from 08/28/2021 with endocrinology))    Anginal pain ([place])    Anxiety    Arthritis Not sure   Asthma    hx of years ago - no longer a problem    ASTHMA, PERSISTENT, MODERATE 04/03/2007   Cancer ([place])    skin    Dyspnea on exertion    a. 11/2007 Echo: EF 60%.   Dysrhythmia    hx of heart arrhythmia 10-15 years ago - followed by DR Lovena Le - not seen in years    GERD (gastroesophageal reflux disease)    Hemophilia carrier    Hypercholesteremia 06/09/2019   Hypertension    Joint pain    HIPS / LEGS   Knee injury    RT   Meningioma ([place])    Morbid obesity ([place])    Neuromuscular disorder ([place]) Not sure, since my fall   Paroxysmal SVT (supraventricular tachycardia)    a. 11/2011 48h Holter: RSR, rare PVC's, occas PAC's   Pre-diabetes    Retinal tear of right eye 01/2016   Thyroid nodule 04/15/2018   Ventral hernia     Past Surgical History:   Procedure Laterality Date   [place]   ARTERY BIOPSY Left 03/21/2022   Procedure: BIOPSY TEMPORAL ARTERY;  Surgeon: Katha Cabal, MD;  Location: [facility] ORS;  Service: Vascular;  Laterality: Left;   BIOPSY THYROID     08/2021   BREAST CYST EXCISION Right 1994   Benign   CESAREAN SECTION     x2   CHOLECYSTECTOMY  1995   COLONOSCOPY WITH PROPOFOL N/A 05/09/2021   Procedure: COLONOSCOPY WITH PROPOFOL;  Surgeon: [name], MD;  Location: [facility] ENDOSCOPY;  Service: Gastroenterology;  Laterality: N/A;   EYE SURGERY  01/2016   Repair retinal tear   FOOT SURGERY  1995 / 1996   x2   HERNIA REPAIR     KNEE ARTHROSCOPY W/ MENISCAL REPAIR  07/25/2013   right knee Dr. Mardelle Matte   LAPAROSCOPIC GASTRIC SLEEVE RESECTION N/A  06/08/2013   Procedure: LAPAROSCOPIC GASTRIC SLEEVE RESECTION AND EXCISION OF SEBACEUS CYST FROM MID CHEST takedown of incarcerated ventral hernia and primary repair endoscopy;  Surgeon: [name], MD;  Location: [facility] ORS;  Service: General;  Laterality: N/A;   LAPAROSCOPIC Kimberly Patton FUNDOPLICATION N/A [id number]   Procedure: LAPAROSCOPIC REPAIR LARGE SYMPTOMATIC HIATAL HERNIA WITH UPPER ENDOSCOPY;  Surgeon: Johnathan Hausen, MD;  Location: [facility] ORS;  Service: General;  Laterality: N/A;   moles  06/2013   removed 2 moles from under arm and  lowback   NECK SURGERY     occipital nerve damage- injections    OVARIAN CYST [place] EXCISIONAL BREAST BIOPSY Right 06/23/2021   Procedure: RADIOACTIVE SEED GUIDED EXCISIONAL RIGHT BREAST BIOPSY;  Surgeon: Johnathan Hausen, MD;  Location: [place];  Service: General;  Laterality: Right;   RETINAL TEAR REPAIR CRYOTHERAPY Right 01/2016   [place]   SKIN CANCER EXCISION     TONSILLECTOMY     TUBAL LIGATION  Think 1994   UPPER GI ENDOSCOPY  06/08/2013   Procedure: UPPER GI ENDOSCOPY;  Surgeon: [name], MD;   Location: [facility] ORS;  Service: General;;   US ECHOCARDIOGRAPHY  12/2011   WNL - EF 55-60%, mild MR, grade 1 diastolic dysnfiction (mild)   VENTRAL HERNIA REPAIR  2000    Current Medications: Current Meds  Medication Sig   albuterol (VENTOLIN HFA) 108 (90 Base) MCG/ACT inhaler Inhale 2 puffs into the lungs every 6 (six) hours as needed for wheezing or shortness of breath.   Calcium Citrate-Vitamin D (CITRACAL + D PO) Take 2 tablets by mouth daily.    CARDIZEM 30 MG tablet Take 1 tablet (30 mg total) by mouth daily as needed (for elevated Heart rate).   Cholecalciferol (VITAMIN D3) 1.25 MG (50000 UT) CAPS Take 1 capsule by mouth once a week. (Patient taking differently: Take 1 capsule by mouth once a week. Tuesday)   Coenzyme Q10-Vitamin E (QUNOL ULTRA COQ10) 100-150 MG-UNIT CAPS Take 100 mg by mouth daily.   Cyanocobalamin (VITAMIN B-12) 5000 MCG SUBL Place 5,000 mcg under the tongue daily.   diltiazem (CARDIZEM SR) 120 MG 12 hr capsule Take 1 capsule (120 mg total) by mouth 2 (two) times daily.   ELDERBERRY PO Take 1,000 mg by mouth daily.    famotidine (PEPCID) 40 MG tablet Take 40 mg by mouth at bedtime.   gabapentin (NEURONTIN) 100 MG capsule Take 200 mg by mouth daily as needed (for neck pain (nerve pain)).   gabapentin (NEURONTIN) 300 MG capsule Take 300 mg by mouth daily.   hydrochlorothiazide (HYDRODIURIL) 25 MG tablet Take 1 tablet (25 mg total) by mouth daily.   Lactobacillus Rhamnosus, GG, (CULTURELLE IMMUNITY SUPPORT PO) Take 1 capsule by mouth daily.   Menaquinone-7 (VITAMIN K2 PO) Take 1 tablet by mouth daily.   methocarbamol (ROBAXIN) 500 MG tablet Take 500 mg by mouth at bedtime as needed for muscle spasms.   montelukast (SINGULAIR) 10 MG tablet TAKE 1 TABLET BY MOUTH EVERYDAY AT BEDTIME (Patient taking differently: Take 10 mg by mouth at bedtime.)   Multiple Vitamins-Minerals (CENTRUM SILVER 50+WOMEN PO) Take 1 tablet by mouth daily.   nitroGLYCERIN (NITROSTAT) 0.4 MG SL tablet  Place 1 tablet (0.4 mg total) under the tongue every 5 (five) minutes as needed for chest pain.   Omega-3 Fatty Acids (FISH OIL) 1200 MG CAPS Take 1 capsule by mouth daily.  pantoprazole (PROTONIX) 40 MG tablet TAKE 1 TABLET BY MOUTH EVERY DAY (Patient taking differently: Take 40 mg by mouth daily.)   Riboflavin 100 MG CAPS Take 1 capsule by mouth daily.   rosuvastatin (CRESTOR) 10 MG tablet Take 1 tablet (10 mg total) by mouth daily.   Semaglutide-Weight Management 0.25 MG/0.5ML SOAJ Inject 0.25 mg into the skin once a week.   TURMERIC PO Take 2 tablets by mouth daily. With Ginger   Zoledronic Acid (RECLAST IV) Inject into the vein. Yearly injection    Allergies:   Wasp venom, Chlorhexidine gluconate, Metronidazole, Prednisone, and Sodium hypochlorite   Social History   Socioeconomic History   Marital status: Divorced    Spouse name: Not on file   Number of children: 2   Years of education: 17   Highest education level: Not on file  Occupational History   Occupation: Product manager: [employer]  Tobacco Use   Smoking status: Never   Smokeless tobacco: Never   Tobacco comments:    smoke at age 50-12  Vaping Use   Vaping Use: Never used  Substance and Sexual Activity   Alcohol use: Not Currently   Drug use: No   Sexual activity: Not Currently    Partners: Male    Birth control/protection: Abstinence, Pill  Other Topics Concern   Not on file  Social History Narrative   Lives at home alone.   Right-handed.   Occasional caffeine use.   Social Determinants of Health   Financial Resource Strain: Low Risk  (05/08/2022)   Overall Financial Resource Strain (CARDIA)    Difficulty of Paying Living Expenses: Not hard at all  Food Insecurity: No Food Insecurity (05/08/2022)   Hunger Vital Sign    Worried About Running Out of Food in the Last Year: Never true    Ran Out of Food in the Last Year: Never true  Transportation Needs: No Transportation Needs  (05/08/2022)   PRAPARE - Hydrologist (Medical): No    Lack of Transportation (Non-Medical): No  Physical Activity: Inactive (05/08/2022)   Exercise Vital Sign    Days of Exercise per Week: 0 days    Minutes of Exercise per Session: 0 min  Stress: No Stress Concern Present (05/08/2022)   [place]    Feeling of Stress : Not at all  Social Connections: Moderately Integrated (05/08/2022)   Social Connection and Isolation Panel [NHANES]    Frequency of Communication with Friends and Family: More than three times a week    Frequency of Social Gatherings with Friends and Family: More than three times a week    Attends Religious Services: More than 4 times per year    Active Member of [employer] or Organizations: Yes    Attends Music therapist: More than 4 times per year    Marital Status: Divorced     Family History:  The patient's family history includes Breast cancer in her mother; Diabetes in her father, mother, and another family member; Hemophilia in an other family member; Hypertension in her father, mother, and another family member; Kidney failure in her mother; Stroke in an other family member.  ROS:   12-point review of systems is negative unless otherwise noted in HPI.   EKGs/Labs/Other Studies Reviewed:    Studies reviewed were summarized above. The additional studies were reviewed today:  Zio patch 03/2022: Patient had a min HR of 44  bpm, max HR of 197 bpm, and avg HR of 74 bpm. Predominant underlying rhythm was Sinus Rhythm. 13 Supraventricular Tachycardia runs occurred, the run with the fastest interval lasting 5 beats with a max rate of 197 bpm, the  longest lasting 14 beats with an avg rate of 155 bpm. Some episodes of Supraventricular Tachycardia may be possible Atrial Tachycardia with variable block. Isolated SVEs were rare (<1.0%), SVE Couplets were rare (<1.0%),  and SVE Triplets were rare  (<1.0%). Isolated VEs were rare (<1.0%, 292), VE Couplets were rare (<1.0%, 8), and VE Triplets were rare (<1.0%, 1).   Conclusion Paroxysmal SVT, no evidence of A-fib noted Continue meds as prescribed __________   2D echo 09/28/2021: 1. Left ventricular ejection fraction, by estimation, is 55 to 60%. The  left ventricle has normal function. The left ventricle has no regional  wall motion abnormalities. Left ventricular diastolic parameters were  normal. The average left ventricular  global longitudinal strain is -20.5 %. The global longitudinal strain is  normal.   2. Right ventricular systolic function is normal. The right ventricular  size is normal.   3. The mitral valve is normal in structure. Trivial mitral valve  regurgitation.   4. The aortic valve is tricuspid. Aortic valve regurgitation is not  visualized.   5. The inferior vena cava is normal in size with greater than 50%  respiratory variability, suggesting right atrial pressure of 3 mmHg. __________   Coronary CTA 09/21/2021: FINDINGS: Aorta:  Normal size.  No calcifications.  No dissection.   Aortic Valve:  Trileaflet.  No calcifications.   Coronary Arteries:  Normal coronary origin.  Right dominance.   RCA is a dominant artery that gives rise to PDA and PLA. There is calcified plaque proximally causing minimal stenosis (<25%).   Left main is a large artery that gives rise to LAD and LCX arteries. LM has no disease.   LAD has calcified plaque proximally causing minimal stenosis (<25%).   LCX is a non-dominant artery that gives rise to two obtuse marginal branches. There is calcified plaque in the proximal LCx causing minimal stenosis.   Other findings:   Normal pulmonary vein drainage into the left atrium.   Normal left atrial appendage without a thrombus.   Normal size of the pulmonary artery.   IMPRESSION: 1. Coronary calcium score of 59.8. This was 76th percentile for  age and sex matched control.   2. Normal coronary origin with right dominance.   3. Calcified plaque causing minimal stenosis in the proximal LAD, RCA, LCx arteries.   4. CAD-RADS 1. Minimal non-obstructive CAD (0-24%). Consider non-atherosclerotic causes of chest pain. Consider preventive therapy and risk factor modification. __________   Elwyn Reach patch 03/2020: Patient had a min HR of 43 bpm, max HR of 193 bpm, and avg HR of 71 bpm. Predominant underlying rhythm was Sinus Rhythm. 1 Ventricular Tachycardia run occurred, lasting 4 beats. Occasional Supraventricular Tachycardia runs occurred,  associated with patient triggered events. Supraventricular Tachycardia was detected  within +/- 45 seconds of symptomatic patient event(s). __________   Carlton Adam MPI 01/2020: There was no ST segment deviation noted during stress. No T wave inversion was noted during stress. The study is normal. This is a low risk study. The left ventricular ejection fraction is normal (55-65%). No significant aortic or coronary artery calcifications __________   2D echo 01/21/2020: 1. Left ventricular ejection fraction, by estimation, is 60 to 65%. The  left ventricle has normal function. The left ventricle has no  regional  wall motion abnormalities. Left ventricular diastolic parameters are  consistent with Grade I diastolic  dysfunction (impaired relaxation). The average left ventricular global  longitudinal strain is -20.4 %.   2. Right ventricular systolic function is normal. The right ventricular  size is normal. There is mildly elevated pulmonary artery systolic  pressure. The estimated right ventricular systolic pressure is [id number] mmHg.   Comparison(s): EF 55-65%. __________   See Epic for remaining remote cardiac studies   EKG:  EKG is not ordered today.  EKGs from 2/7 and 2/8 reviewed.  Recent Labs: 08/28/2021: TSH 0.85 05/11/2022: ALT 14 06/29/2022: BUN 26; Creatinine, Ser 0.78; Hemoglobin 12.0;  Platelets 254; Potassium 3.1; Sodium 136  Recent Lipid Panel    Component Value Date/Time   CHOL 138 07/11/2022 0659   CHOL 168 07/31/2021 0905   TRIG 82 07/11/2022 0659   HDL 61 07/11/2022 0659   HDL 67 07/31/2021 0905   CHOLHDL 2.3 07/11/2022 0659   VLDL 16 07/11/2022 0659   LDLCALC 61 07/11/2022 0659   LDLCALC 85 07/31/2021 0905    PHYSICAL EXAM:    VS:  BP 138/84 (BP Location: Left Arm, Patient Position: Sitting, Cuff Size: Normal) Comment (BP Location): forearm  Pulse 79   Ht 5' 6"$  (1.676 m)   Wt 279 lb 3.2 oz (126.6 kg)   SpO2 98%   BMI 45.06 kg/m   BMI: Body mass index is 45.06 kg/m.  Physical Exam Vitals reviewed.  Constitutional:      Appearance: She is well-developed.  HENT:     Head: Normocephalic and atraumatic.  Eyes:     General:        Right eye: No discharge.        Left eye: No discharge.  Neck:     Vascular: No JVD.  Cardiovascular:     Rate and Rhythm: Normal rate and regular rhythm.     Heart sounds: Normal heart sounds, S1 normal and S2 normal. Heart sounds not distant. No midsystolic click and no opening snap. No murmur heard.    No friction rub.  Pulmonary:     Effort: Pulmonary effort is normal. No respiratory distress.     Breath sounds: Normal breath sounds. No decreased breath sounds, wheezing or rales.  Chest:     Chest wall: No tenderness.  Abdominal:     General: There is no distension.  Musculoskeletal:     Cervical back: Normal range of motion.  Skin:    General: Skin is warm and dry.     Nails: There is no clubbing.  Neurological:     Mental Status: She is alert and oriented to person, place, and time.  Psychiatric:        Speech: Speech normal.        Behavior: Behavior normal.        Thought Content: Thought content normal.        Judgment: Judgment normal.     Wt Readings from Last 3 Encounters:  07/20/22 279 lb 3.2 oz (126.6 kg)  07/18/22 280 lb 3.2 oz (127.1 kg)  07/16/22 280 lb 6.4 oz (127.2 kg)      ASSESSMENT & PLAN:   Tachypalpitations with history of paroxysmal SVT: Recent outpatient cardiac monitor showed no sustained arrhythmias, with short paroxysms of SVT.  No evidence of further A-fib.  She describes an episode of tachypalpitations yesterday with heart rates in the 120s bpm that persisted for quite some time.  Upon arrival to local urgent  care, she was noted to be in sinus tachycardia.  We have agreed to defer further outpatient cardiac monitoring with a Zio patch.  She will obtain a Kardia mobile device and attempt to capture these episodes should they recur.  Continue long-acting Cardizem 120 mg twice daily with short acting diltiazem as needed.  Nonobstructive CAD without angina: Recent coronary CTA showed mild nonobstructive CAD with less than 25% stenosis involving the LAD and RCA just 10 months prior.  Continue risk factor modification.  It has been felt her prior symptoms were related to GERD.  Lone A-fib: Occurred in the setting of temporal artery biopsy and prednisone use.  No further evidence of recurrent arrhythmia.  Given this, she has not been maintained on [place].  Should she have documented recurrence of A-fib, anticoagulation would need to be revisited at that time.  HTN: Blood pressure is reasonably controlled in the office today.  HLD: LDL 61 in 06/2022.  She remains on rosuvastatin 10 mg.  She has significantly improved lifestyle.  Obesity: Working hard on lifestyle modification.  GERD: Ongoing management per GI.   Disposition: F/u with Dr. Garen Lah or an APP in 2 months.   Medication Adjustments/Labs and Tests Ordered: Current medicines are reviewed at length with the patient today.  Concerns regarding medicines are outlined above. Medication changes, Labs and Tests ordered today are summarized above and listed in the Patient Instructions accessible in Encounters.   Signed, Christell Faith, PA-C 07/20/2022 4:29 PM     [facility] [address] [place], [place] [address] [phone]

## 2022-07-23 ENCOUNTER — Ambulatory Visit: Payer: HMO | Admitting: Occupational Therapy

## 2022-07-24 ENCOUNTER — Other Ambulatory Visit: Payer: Self-pay | Admitting: Family Medicine

## 2022-07-25 ENCOUNTER — Ambulatory Visit: Payer: HMO | Admitting: Occupational Therapy

## 2022-07-25 DIAGNOSIS — I890 Lymphedema, not elsewhere classified: Secondary | ICD-10-CM

## 2022-07-25 NOTE — Telephone Encounter (Signed)
Last office visit 05/23/2022 for sore throat and osteoporosis.  Last refilled 09/05/21 for #12 with 3 refills.  Last Vitamin D level 03/14/22 which was normal at 54.40 ng/mL.  Next Appt:  No future appointments with PCP.

## 2022-07-26 ENCOUNTER — Encounter: Payer: Self-pay | Admitting: Internal Medicine

## 2022-07-26 ENCOUNTER — Encounter: Payer: Self-pay | Admitting: Occupational Therapy

## 2022-07-26 NOTE — Therapy (Signed)
OUTPATIENT OCCUPATIONAL THERAPY TREATMENT NOTE  LOWER EXTREMITY LYMPHEDEMA  Patient Name: JYLL MONROE MRN: [id number] DOB:[birth year], 67 y.o., female Today's Date: 07/26/2022  END OF SESSION:  OT End of Session - 07/25/22 0911     Visit Number 6    Number of Visits 36    Date for OT Re-Evaluation 09/30/22    OT Start Time 0800    OT Stop Time 0900    OT Time Calculation (min) 60 min    Activity Tolerance Patient tolerated treatment well;No increased pain    Behavior During Therapy [facility] for tasks assessed/performed               Past Medical History:  Diagnosis Date   ALLERGIC RHINITIS 04/03/2007   Allergy    Not sure   Anemia    ((Pt Qnr Sub: Denies at visit from 08/28/2021 with endocrinology))    Anginal pain ([place])    Anxiety    Arthritis Not sure   Asthma    hx of years ago - no longer a problem    ASTHMA, PERSISTENT, MODERATE 04/03/2007   Cancer ([place])    skin    Dyspnea on exertion    a. 11/2007 Echo: EF 60%.   Dysrhythmia    hx of heart arrhythmia 10-15 years ago - followed by DR Lovena Le - not seen in years    GERD (gastroesophageal reflux disease)    Hemophilia carrier    Hypercholesteremia 06/09/2019   Hypertension    Joint pain    HIPS / LEGS   Knee injury    RT   Meningioma ([place])    Morbid obesity ([place])    Neuromuscular disorder ([place]) Not sure, since my fall   Paroxysmal SVT (supraventricular tachycardia)    a. 11/2011 48h Holter: RSR, rare PVC's, occas PAC's   Pre-diabetes    Retinal tear of right eye 01/2016   Thyroid nodule 04/15/2018   Ventral hernia    Past Surgical History:  Procedure Laterality Date   [place]   ARTERY BIOPSY Left 03/21/2022   Procedure: BIOPSY TEMPORAL ARTERY;  Surgeon: Katha Cabal, MD;  Location: [facility] ORS;  Service: Vascular;  Laterality: Left;   BIOPSY THYROID     08/2021   BREAST CYST EXCISION Right 1994   Benign   CESAREAN SECTION     x2    CHOLECYSTECTOMY  1995   COLONOSCOPY WITH PROPOFOL N/A 05/09/2021   Procedure: COLONOSCOPY WITH PROPOFOL;  Surgeon: [name], MD;  Location: [facility] ENDOSCOPY;  Service: Gastroenterology;  Laterality: N/A;   EYE SURGERY  01/2016   Repair retinal tear   FOOT SURGERY  1995 / 1996   x2   HERNIA REPAIR     KNEE ARTHROSCOPY W/ MENISCAL REPAIR  07/25/2013   right knee Dr. Mardelle Matte   LAPAROSCOPIC GASTRIC SLEEVE RESECTION N/A 06/08/2013   Procedure: LAPAROSCOPIC GASTRIC SLEEVE RESECTION AND EXCISION OF SEBACEUS CYST FROM MID CHEST takedown of incarcerated ventral hernia and primary repair endoscopy;  Surgeon: [name], MD;  Location: [facility] ORS;  Service: General;  Laterality: N/A;   LAPAROSCOPIC NISSEN FUNDOPLICATION N/A [id number]   Procedure: LAPAROSCOPIC REPAIR LARGE SYMPTOMATIC HIATAL HERNIA WITH UPPER ENDOSCOPY;  Surgeon: Johnathan Hausen, MD;  Location: [facility] ORS;  Service: General;  Laterality: N/A;   moles  06/2013   removed 2 moles from under arm and  lowback   NECK SURGERY     occipital nerve damage- injections  OVARIAN CYST REMOVAL  1970   PILONIDAL CYST EXCISION  1975   RADIOACTIVE SEED GUIDED EXCISIONAL BREAST BIOPSY Right 06/23/2021   Procedure: RADIOACTIVE SEED GUIDED EXCISIONAL RIGHT BREAST BIOPSY;  Surgeon: Johnathan Hausen, MD;  Location: [place];  Service: General;  Laterality: Right;   RETINAL TEAR REPAIR CRYOTHERAPY Right 01/2016   [place]   SKIN CANCER EXCISION     TONSILLECTOMY     TUBAL LIGATION  Think 1994   UPPER GI ENDOSCOPY  06/08/2013   Procedure: UPPER GI ENDOSCOPY;  Surgeon: [name], MD;  Location: [facility] ORS;  Service: General;;   US ECHOCARDIOGRAPHY  12/2011   WNL - EF 55-60%, mild MR, grade 1 diastolic dysnfiction (mild)   VENTRAL HERNIA REPAIR  2000   Patient Active Problem List   Diagnosis Date Noted   COVID-19 virus infection 04/30/2022   Paroxysmal atrial fibrillation ([place]) 03/28/2022   Empty sella syndrome ([place]) 03/28/2022    Varicose veins with inflammation 01/07/2022   S/P breast lumpectomy 10/10/2021   Cervical radiculopathy 06/28/2020   Chronic venous insufficiency 05/29/2020   Lymphedema 05/29/2020   CAD (coronary artery disease) 05/29/2020   Cervical lymphadenitis 04/18/2020   History of repair of hiatal hernia 02/02/2020   Orthostatic hypotension 01/01/2020   Hypercholesteremia 06/09/2019   Chronic neuropathic pain 07/31/2018   Thyroid nodule 04/15/2018   Skin lesion of scalp 09/06/2017   Age-related osteoporosis without current pathological fracture 12/04/2016   Osteoarthritis of knee 07/17/2016   Meningioma ([place]) 01/18/2016   Family history of aortic stenosis 01/20/2015   Status post laparoscopic sleeve gastrectomy Dec 2014 06/08/2013   Morbid obesity ([place]) 12/08/2012   Primary hypertension 10/11/2011   Varicose veins of bilateral lower extremities with other complications [id number]   FIBROIDS, UTERUS 04/03/2007   Asthma, mild intermittent 04/03/2007    PCP: [name], MD  REFERRING PROVIDER: [name], MD  REFERRING DIAG: I89.0  THERAPY DIAG:  Lymphedema, not elsewhere classified  Rationale for Evaluation and Treatment: Rehabilitation  ONSET DATE: 07/02/22  SUBJECTIVE:                                                                                                                                                                                           SUBJECTIVE STATEMENT:Annisten R Edelson PRESENTS FOR to Rx VISIT TO ADDRESS BLE LYMPHEDEMA. Pt reports she was , once again, unable to tolerate compression bandages on R leg during visit interval. Pt reports wraps make her knee hurt  so badly she can't walk. Pt reports knee pain resolves once wraps are removed.   PERTINENT HISTORY: HTN, MORBID OBESITY (BMI- 46.26), Afib, CVI, Varicose  veins w/ inflammation, Periferal neuropathy, Asthma, OA , Dyspnea on Exertion (EF 60%), SVT, knee injury, Gastric sleeve 2014, C section, Pre-diabetes,  Thyroid mass  PAIN:  Are you having pain? Endorses "heaviness",knee joint pain Pt rates chronic B knee pain as 4/10: NPRS scale: 0/10 Pain location: R knee, B legs Pain description: sore, heavy, full, tired Aggravating factors: standing, walking, dependent sitting, lymphedema wraps Relieving factors: elevation  PRECAUTIONS: Other: LYMPHEDEMA PRECAUTIONS: Thyroid, cardiac, pulmonary  FALLS  SINCE LAST VISIT: No  HAND DOMINANCE: right   PRIOR LEVEL OF FUNCTION:  Modified independent w/ extra time  PATIENT GOALS: reduce swelling, limit LE progression   OBJECTIVE:  OBSERVATIONS / OTHER ASSESSMENTS:  Mild, Stage  II, Bilateral Lower Extremity Lymphedema 2/2 CVI and Morbid Obesity (suspect Lipedema component)    BLE COMPARATIVE LIMB VOLUMETRICS  LANDMARK INITIAL RIGHT  07/06/22  R LEG (A-D) 9622.4 ml  R THIGH (E-G) 11079.5 ml  R FULL LIMB (A-G) 20702.0 ml  Limb Volume differential (LVD)   R dominant LVD Leg = 4.6%, R>L; LVD thigh= 11079.5%, R>L; LVD full  limb 8.4%, R>L%  Volume change since initial %  Volume change overall V  (Blank rows = not tested)  LANDMARK INITIAL LLE 07/06/22  L LEG (A-D) 9183.3 ml  L THIGH (E-G) 9771.3 ml  RLE Full LIMB (A-G) 18957 ml  Limb Volume differential (LVD)  N/A  Volume change since initial %  Volume change overall %  (Blank rows = not tested  LYMPHEDEMA LIFE IMPACT SCALE (LLIS): Intake: 51.47% (The extent to which LE-related problems affected your life in the last week)  FOTO functional outcomes measure: Intake :Intake  56 %  Height: 5'6" Weight: 286.9 lb BMI: 46.26  TODAY'S TREATMENT:                                                                                                                                          OT Rx : Intensive Phase CDT RLE  PATIENT EDUCATION:  Education details: Continued Pt/ CG edu for lymphedema self care home program throughout session. Topics include outcome of comparative limb volumetrics-  starting limb volume differentials (LVDs), technology and gradient techniques used for short stretch, multilayer compression wrapping, simple self-MLD, therapeutic lymphatic pumping exercises, skin/nail care, LE precautions,. compression garment recommendations and specifications, wear and care schedule and compression garment donning / doffing w assistive devices. Discussed progress towards all OT goals since commencing CDT. All questions answered to the Pt's satisfaction. Good return. Person educated: Patient Education method: Explanation, Demonstration, and Verbal cues, handouts Education comprehension: verbalized understanding, returned demonstration, and needs further education  LYMPHEDEMA SELF CARE HOME PROGRAM: BLE Lymphatic Pumping There ex Skin care to limit infection risk Compression Intensive stage compression: multilayer short stretch wraps with gradient techniques: One each 8, 10 and 12 cm wide by 5 M long short stretch compression wrap applied circumferentially using staggered, gradient technique  over single layer of .04 cm Rosidal foam and cotton stockinett.  Foot omitted. Self Management Phase Compression: Custom, flat knit,  BLE compression knee highs. Consider Elvarex classic,  ccl 2, OT, Oblique SB, Tricot patch; BLE Jobst Relax for HOS Simple self-MLD. Consider advanced Flexitouch device to assist w long term self management at home   Custom-made gradient compression garments and HOS devices are medically necessary in this case because they are uniquely sized and shaped to fit the exact dimensions of the affected extremities with deformities, and to provide accurate and consistent gradient compression essential to optimally managing this patient's symptoms of chronic, progressive Lipo-lymphedema. Multiple custom compression garments are needed for optimal hygiene to limit infection risk. Custom compression garments should be replaced q 3-6 months When worn consistently for optimal  lipo-lymphedema self-management over time.   ASSESSMENT:  CLINICAL IMPRESSION: Pt opts out of OT for Intensive Phase CDT going forward due to difficulty tolerating knee length, multilayer , gradient compression wraps to RLE. Full limb was not wrapped due to extremely fatty, doughy tissue integrity and exaggerated cone shape above the knee.Pt would like to skip wraps and massage , but wants to move forward with the compression garments and devices we recommended and ordered last week. These include elastic compression garment alternatives- Velcro wrap style, knee length,  compression leggings that can be calibrated for compression class. These CircAids are easy to don and doff and work well for patients whose feet are not involved.Pt will also be fit with convoluted HOS devices to limit fibrosis formation and facilitate increased lymphatic function overnight. All other treatment is discontinued. Pt will return for fittings.    OBJECTIVE IMPAIRMENTS: Abnormal gait, decreased balance, decreased knowledge of condition, decreased knowledge of use of DME, decreased mobility, decreased ROM, decreased strength, increased edema, postural dysfunction, obesity, and pain. Increased infection risk 2/2 chronic progressive lymphedema in BLE  ACTIVITY LIMITATIONS: bending, sitting, standing, squatting, stairs, transfers, bathing, dressing, hygiene/grooming, and Reaching feet to bathe , groom nails, inspect skin , perform skin care; difficulty fitting preferred street shoes and LB clothing 2/2 swelling, impaired home management- shopping, cleaning, yard work, long distance driving causes swelling to worsen;   standing to prep food and cook, social participation requiring standing, walking, dependent sitting > 30 minutes results in increased pain and swelling  PERSONAL FACTORS: Age, Time since onset of injury/illness/exacerbation, and 3+ comorbidities: (HTN, Afib, , morbid obesity, CVI)  are also affecting patient's  functional outcome.   REHAB POTENTIAL: Good  EVALUATION COMPLEXITY: Moderate   GOALS: Goals reviewed with patient? Yes  SHORT TERM GOALS: Target date: 4TH to Rx VISIT   Pt will demonstrate understanding of lymphedema precautions and prevention strategies with modified independence using a printed reference to identify at least 5 precautions and discussing how s/he may implement them into daily life to reduce risk of progression with modified assistance. Baseline:Max A Goal status:NOT MET  2.  Pt will demonstrate understanding of obesity-related lymphedema and verbalize understanding that weight loss is the critical first step in the lymphedema plan of care for reducing limb volume, infection risk, and progression. Baseline: Max A Goal status:GOAL MET  3.   With Max caregiver assistance Pt will be able to apply multilayer, knee length, multilayer, compression wraps using gradient techniques to decrease limb volume, to limit infection risk, and to limit lymphedema progression.  Baseline: Dependent Goal status:GOAL MET  07/10/22 Goal Modified: Pt able to apply knee length wraps with min A and extra time today after  edu. No caregiver needed to assist.    LONG TERM GOALS: Target date: 09/30/22  Given this patient's Intake score of 56 /100% on the functional outcomes FOTO tool, patient will experience an increase in function of 3 points to improve basic and instrumental ADLs performance, including lymphedema self-care. (TBA at first OT Rx visit) Baseline: Max A Goal status:GOAL NOT MET  2.  Given this patient's Intake score of  51% on the Lymphedema Life Impact Scale (LLIS), patient will experience a reduction of at least 5% in her perceived level of functional impairment resulting from lymphedema to improve functional performance and quality of life (QOL). Baseline: Max A Goal status: GOAL NOT MET  3.  Pt will achieve at least a 10% volume reductions bilaterally below the knees to  return limb to more typical size and shape, to limit infection risk and LE progression, to decrease pain, to improve function, and to improve body image and QOL. Baseline: Dependent Goal status: GOAL NOT MET  4.  Pt will achieve and sustain at least 85% consistent compliance with all 4 lymphedema self-care home program components throughout Intensive Phase of CDT to achieve optimal limb volume reduction, limit infection risk, and limit LE progression. Baseline: Dependent Goal status:  GOAL NOT MET  5.  Pt will be able to don and doff custom     compression garments using assistive devices to achieve optimal LE self management over time. Baseline: Dependent Goal status: ONGOING   PLAN:  PT FREQUENCY: return for garment/ device fitting , then DC OT for LE care  PT DURATION: 12 weeks and PRN  PLANNED INTERVENTIONS: Therapeutic exercises, Therapeutic activity, Patient/Family education, Self Care, DME instructions, Manual lymph drainage, Compression bandaging, Manual therapy, and skin care, custom compression garment measurement and fitting, consider trial with advanced Flexitouch sequential pneumatic compression device if OK's by MD  PLAN FOR NEXT SESSION:  FIT custom CircAids and Jobst RELAX HOS devices Pt edu for LE self care, garment wear and care  Andrey Spearman, MS, OTR/L, CLT-LANA 07/26/22 9:31 AM

## 2022-07-27 ENCOUNTER — Telehealth: Payer: Self-pay | Admitting: Nurse Practitioner

## 2022-07-27 NOTE — Telephone Encounter (Signed)
   Pt called to report variable BPs today - initially 170's prompting her to take a prn dilt 32m x 1.  Systolic BPs have since come down but diastolics have risen - [id number]  Mild headache, but otw asymptomatic.  Narrow pulse pressure brings accuracy of most recent recording into question. She repeated while on the phone w/ me and got 176/105.  She is due to take dilt cd 120 @ 6p.  I rec that she take dilt cd as scheduled.  Given significant variability between 1 cuff pressure and another just few mins apart, I rec that she consider obtaining a different cuff.  Caller verbalized understanding and was grateful for the call back.  CMurray Hodgkins NP 07/27/2022, 6:11 PM

## 2022-07-30 ENCOUNTER — Ambulatory Visit: Payer: HMO | Admitting: Occupational Therapy

## 2022-07-31 ENCOUNTER — Encounter: Payer: Self-pay | Admitting: Cardiology

## 2022-07-31 DIAGNOSIS — I890 Lymphedema, not elsewhere classified: Secondary | ICD-10-CM | POA: Diagnosis not present

## 2022-08-01 ENCOUNTER — Ambulatory Visit: Payer: HMO | Admitting: Occupational Therapy

## 2022-08-01 ENCOUNTER — Encounter: Payer: Self-pay | Admitting: Internal Medicine

## 2022-08-01 ENCOUNTER — Ambulatory Visit
Admission: RE | Admit: 2022-08-01 | Discharge: 2022-08-01 | Disposition: A | Discharge status: Home / Self Care | Payer: Medicare Other | Source: Ambulatory Visit | Attending: Internal Medicine | Admitting: Internal Medicine

## 2022-08-01 ENCOUNTER — Encounter: Payer: Self-pay | Admitting: Family Medicine

## 2022-08-01 ENCOUNTER — Ambulatory Visit: Payer: BC Managed Care – PPO | Admitting: Cardiology

## 2022-08-01 DIAGNOSIS — E041 Nontoxic single thyroid nodule: Secondary | ICD-10-CM | POA: Diagnosis not present

## 2022-08-01 DIAGNOSIS — E042 Nontoxic multinodular goiter: Secondary | ICD-10-CM

## 2022-08-01 DIAGNOSIS — M810 Age-related osteoporosis without current pathological fracture: Secondary | ICD-10-CM

## 2022-08-01 DIAGNOSIS — R6889 Other general symptoms and signs: Secondary | ICD-10-CM

## 2022-08-02 ENCOUNTER — Encounter: Payer: Self-pay | Admitting: Internal Medicine

## 2022-08-06 ENCOUNTER — Other Ambulatory Visit: Payer: BC Managed Care – PPO

## 2022-08-06 ENCOUNTER — Ambulatory Visit: Payer: HMO | Admitting: Occupational Therapy

## 2022-08-08 ENCOUNTER — Other Ambulatory Visit: Payer: Self-pay

## 2022-08-08 ENCOUNTER — Ambulatory Visit: Payer: HMO | Admitting: Occupational Therapy

## 2022-08-08 MED ORDER — DILTIAZEM HCL ER 120 MG PO CP12: 120.0000 mg | ORAL_CAPSULE | Freq: Two times a day (BID) | ORAL | 2 refills | Status: DC

## 2022-08-09 ENCOUNTER — Encounter: Payer: Self-pay | Admitting: Internal Medicine

## 2022-08-10 ENCOUNTER — Encounter: Payer: Self-pay | Admitting: Cardiology

## 2022-08-10 ENCOUNTER — Other Ambulatory Visit (INDEPENDENT_AMBULATORY_CARE_PROVIDER_SITE_OTHER): Payer: HMO

## 2022-08-10 ENCOUNTER — Encounter: Payer: Self-pay | Admitting: Family Medicine

## 2022-08-10 DIAGNOSIS — R6889 Other general symptoms and signs: Secondary | ICD-10-CM | POA: Diagnosis not present

## 2022-08-10 DIAGNOSIS — M810 Age-related osteoporosis without current pathological fracture: Secondary | ICD-10-CM

## 2022-08-10 LAB — TSH: TSH: 1.9 u[IU]/mL (ref 0.35–5.50)

## 2022-08-10 LAB — COMPREHENSIVE METABOLIC PANEL
ALT: 12 U/L (ref 0–35)
AST: 15 U/L (ref 0–37)
Albumin: 3.9 g/dL (ref 3.5–5.2)
Alkaline Phosphatase: 68 U/L (ref 39–117)
BUN: 24 mg/dL — ABNORMAL HIGH (ref 6–23)
CO2: 32 mEq/L (ref 19–32)
Calcium: 9.9 mg/dL (ref 8.4–10.5)
Chloride: 97 mEq/L (ref 96–112)
Creatinine, Ser: 0.62 mg/dL (ref 0.40–1.20)
GFR: 92.56 mL/min (ref >60.00)
Glucose, Bld: 95 mg/dL (ref 70–99)
Potassium: 2.9 mEq/L — ABNORMAL LOW (ref 3.5–5.1)
Sodium: 141 mEq/L (ref 135–145)
Total Bilirubin: 0.5 mg/dL (ref 0.2–1.2)
Total Protein: 6.6 g/dL (ref 6.0–8.3)

## 2022-08-10 LAB — T4, FREE: Free T4: 0.8 ng/dL (ref 0.60–1.60)

## 2022-08-10 LAB — T3, FREE: T3, Free: 2.8 pg/mL (ref 2.3–4.2)

## 2022-08-10 MED ORDER — POTASSIUM CHLORIDE CRYS ER 20 MEQ PO TBCR: 40.0000 meq | EXTENDED_RELEASE_TABLET | Freq: Every day | ORAL | 0 refills | Status: DC

## 2022-08-13 ENCOUNTER — Telehealth: Payer: Self-pay

## 2022-08-13 ENCOUNTER — Ambulatory Visit: Payer: PPO | Admitting: Occupational Therapy

## 2022-08-13 MED ORDER — VERAPAMIL HCL ER 120 MG PO TBCR: 120.0000 mg | EXTENDED_RELEASE_TABLET | Freq: Every day | ORAL | 3 refills | Status: DC

## 2022-08-13 MED ORDER — SPIRONOLACTONE 25 MG PO TABS: 25.0000 mg | ORAL_TABLET | Freq: Every day | ORAL | 3 refills | Status: DC

## 2022-08-13 NOTE — Telephone Encounter (Addendum)
Called patient.  Patient is agreeable with plan.   Patient reports that she already has lab work on Friday at her PCP office.  Medication list was updated and new medications were sent in.   [name], MD  You; Meryl Crutch, RN22 minutes ago (1:26 PM)    Stop HCTZ due to hypokalemia.  Start KCl 20 mEq daily x 5 days then stop.  Start Aldactone 25 mg daily.  Check BMP in 1 week.  Thank you.   Meryl Crutch, RN  You; [name], MD3 days ago    Please advise. Results in epic along with Dr. Newman Nip recommendations   Myles Gip  P Cv Div [facility]url Triage (supporting [name], MD)3 days ago    Had lab this morning. My potassium is extremely low. Dr Diona Browner said could be from the hydrochlorthiaze. Should I stop taking?     [name], MD  You17 minutes ago (1:31 PM)     Try verapamil ER 120 mg twice daily.  Thank you   You  Agbor-Etang, Aaron Edelman, MD4 hours ago (9:00 AM)     Diltiazem ER is not covered. Cost for patient is $ 238 and she can not afford that.  Any other options?      Haig Prophet, RN  You6 hours ago (7:32 AM)     Kimberly Patton,  Can you give me a hand in this one please and thank you?   Kimberly Patton  P Cv Div [facility]url Triage (supporting [name], MD)2 days ago     Very stressful weekend [facility] has  called me 4 times to pick up Diltiazem. Cost 238.00   There are Diltiazem listed in a lower tier but not 12 HR ER.. Making it a Tier 4   My Insurance is [employer], [employer] ([employer]). I'm seeing if they will adjust the tier. Supposedly paper work should be in your office.    If this doesn't happen, what do I do?    Kimberly Patton  P Cv Div [facility]url Triage (supporting [name], MD)3 days ago     I went to pick up my blood pressure meds, Diltiazem.   It was 200.00   I called the [employer]. Michela Pitcher it was not a preferred med. It was a trier 4.   I told them it took awhile to come up with the correct  meds for me. They are sending a letter for you to complete. It needs to be sent back ASAP, I'm almost out of meds.   Please put a rush on this Thx

## 2022-08-13 NOTE — Telephone Encounter (Signed)
Called patient.  Patient is agreeable with plan.    Patient reports that she already has lab work on Friday at her PCP office.   Medication list was updated and new medications were sent in.

## 2022-08-14 ENCOUNTER — Ambulatory Visit: Payer: Self-pay | Admitting: Surgery

## 2022-08-14 ENCOUNTER — Telehealth: Payer: Self-pay | Admitting: Family Medicine

## 2022-08-14 ENCOUNTER — Other Ambulatory Visit: Payer: Self-pay

## 2022-08-14 ENCOUNTER — Telehealth: Payer: Self-pay | Admitting: Cardiology

## 2022-08-14 ENCOUNTER — Encounter: Payer: Self-pay | Admitting: Family Medicine

## 2022-08-14 ENCOUNTER — Encounter (INDEPENDENT_AMBULATORY_CARE_PROVIDER_SITE_OTHER): Payer: HMO | Admitting: Cardiology

## 2022-08-14 DIAGNOSIS — I471 Supraventricular tachycardia, unspecified: Secondary | ICD-10-CM | POA: Diagnosis not present

## 2022-08-14 DIAGNOSIS — E041 Nontoxic single thyroid nodule: Secondary | ICD-10-CM | POA: Diagnosis not present

## 2022-08-14 DIAGNOSIS — E876 Hypokalemia: Secondary | ICD-10-CM

## 2022-08-14 LAB — C-TERMINAL TELOPEPTIDE: C-Telopeptide (CTx): 79 pg/mL

## 2022-08-14 MED ORDER — VERAPAMIL HCL ER 120 MG PO TBCR: 120.0000 mg | EXTENDED_RELEASE_TABLET | Freq: Two times a day (BID) | ORAL | 0 refills | Status: DC

## 2022-08-14 NOTE — Telephone Encounter (Signed)
Scheduled patient for Friday at 9:15am and responded to the patient in my chart

## 2022-08-14 NOTE — H&P (View-Only) (Signed)
      PROVIDER:  Annalysia Willenbring MICHAEL Parisa Pinela, MD     Chief Complaint: Follow-up (1 yr follow up thyroid nodule)   History of Present Illness:   Patient returns for scheduled surgical follow-up having been evaluated last spring for dominant left-sided thyroid nodule.  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  Subsequent molecular genetic testing with AFIRMA was benign.  Decision was made to continue observation.  Patient underwent repeat ultrasound on August 01, 2022.  This again demonstrates a dominant nodule in the left thyroid lobe now measuring 4.9 x 3.7 x 2.9 cm, representing a significant increase in size.  Right thyroid lobe appears normal.  Patient describes a globus sensation.  She describes shortness of breath with vigorous physical activity.  She is having pill dysphagia.  Thyroid function remains normal at 1.90.  Patient presents today to discuss options for surgical management.  Patient continues to be followed closely by her endocrinologist, Dr. Cristina Gherghe.     Review of Systems: A complete review of systems was obtained from the patient.  I have reviewed this information and discussed as appropriate with the patient.  See HPI as well for other ROS.   Review of Systems  Constitutional: Negative.   HENT:         Globus sensation  Eyes: Negative.   Respiratory: Negative.    Cardiovascular: Negative.   Gastrointestinal:  Positive for heartburn.       Dysphagia (pills)  Genitourinary: Negative.   Musculoskeletal: Negative.   Skin: Negative.   Neurological: Negative.   Endo/Heme/Allergies: Negative.   Psychiatric/Behavioral: Negative.          Medical History: Past Medical History      Past Medical History:  Diagnosis Date   GERD (gastroesophageal reflux disease)     Hypertension             Patient Active Problem List  Diagnosis   Tingling   Chronic tension-type headache, not intractable   Muscle strain   Allergic rhinitis    Asthma, mild intermittent   Chronic neuropathic pain   Ear pain, right   HTN (hypertension)   Hypercholesteremia   Neck swelling   Numbness and tingling of both feet   Osteoarthritis of knee   Body mass index (BMI) 40.0-44.9, adult (CMS-HCC)   Bradycardia   Globus sensation   History of repair of hiatal hernia   Meningioma (CMS-HCC)   Status post laparoscopic sleeve gastrectomy   Varicose veins of bilateral lower extremities with other complications   Tinnitus of both ears   Thyroid nodule   Family history of aortic stenosis   Elevated lipase   Dysuria   Chronic venous insufficiency   Cervical radiculopathy   Cervical lymphadenitis   CAD (coronary artery disease)   Atrophic vaginitis   Leiomyoma of uterus, unspecified   Lymphedema   Morbid obesity (CMS-HCC)   Orthostatic hypotension   Osteoporosis   Palpitations   Skin lesion of scalp   Age-related osteoporosis without current pathological fracture   Breast pain, right   S/P breast lumpectomy      Past Surgical History       Past Surgical History:  Procedure Laterality Date   EGD   12/23/2019    Normal EGD biopsy/No Repeat/CTL   BIOPSY TEMPORAL ARTERY       CESAREAN SECTION       HERNIA REPAIR       KNEE ARTHROSCOPY Bilateral            Allergies       Allergies  Allergen Reactions   Wasp Venom Anaphylaxis   Chlorhexidine Gluconate Other (See Comments)      Itching under breasts   Metronidazole Other (See Comments)      Chest tightness, neck tightness   Prednisone Other (See Comments)      Severe migraine / with taper dose   Bleach (Sodium Hypochlorite) Rash      Liquid clorox bleach   Sodium Hypochlorite Rash      Liquid clorox bleach              Current Outpatient Medications on File Prior to Visit  Medication Sig Dispense Refill   spironolactone (ALDACTONE) 25 MG tablet Take 1 tablet by mouth once daily       verapamiL (CALAN-SR) 120 MG SR tablet Take by mouth       albuterol 90 mcg/actuation  inhaler Inhale into the lungs       azelastine (ASTELIN) 137 mcg nasal spray Place 1 spray into both nostrils 2 (two) times daily 10 mL 1   calcium carbonate 600 mg calcium (1,500 mg) Tab tablet Take 600 mg by mouth once daily.       cholecalciferol, vitamin D3, 400 unit Cap Take by mouth.       CYANOCOBALAMIN, VITAMIN B-12, SL Place under the tongue.       diltiazem (CARDIZEM CD) 180 MG CD capsule 120 mg       diphenhydrAMINE HCl 2 % Gel Apply 1 Application topically 2 (two) times daily 1 Tube 0   famotidine (PEPCID) 40 MG tablet Take 1 tablet (40 mg total) by mouth at bedtime 90 tablet 1   fluticasone (FLONASE) 50 mcg/actuation nasal spray         gabapentin (NEURONTIN) 300 MG capsule Take 300 mg by mouth 2 (two) times daily       hydroCHLOROthiazide (HYDRODIURIL) 25 MG tablet         mecobalamin/folic acid (OPURITY SL)         methocarbamoL (ROBAXIN) 500 MG tablet Take 1 tablet (500 mg total) by mouth at bedtime as needed for up to 180 days 30 tablet 3   montelukast (SINGULAIR) 10 mg tablet Take 10 mg by mouth at bedtime       nitroGLYcerin (NITROSTAT) 0.4 MG SL tablet         pantoprazole (PROTONIX) 40 MG DR tablet TAKE 1 TABLET BY MOUTH TWICE A DAY 180 tablet 3   rosuvastatin (CRESTOR) 10 MG tablet Take 10 mg by mouth once daily       traMADol (ULTRAM) 50 mg tablet         turm-ging-bos-yuc-wil-cham-hor 100-100-100-125 mg Tab Take by mouth once daily.       turmeric, bulk, 100 % Powd Take 1 tablet by mouth once daily        No current facility-administered medications on file prior to visit.      Family History       Family History  Problem Relation Age of Onset   Breast cancer Mother     Diabetes type I Mother     Diabetes type I Father     High blood pressure (Hypertension) Father     Diabetes type I Sister     High blood pressure (Hypertension) Sister     Diabetes type I Brother     High blood pressure (Hypertension) Brother     High blood pressure (Hypertension) Maternal  Aunt            Social History       Tobacco Use  Smoking Status Never  Smokeless Tobacco Never      Social History  Social History         Socioeconomic History   Marital status: Divorced  Tobacco Use   Smoking status: Never   Smokeless tobacco: Never  Vaping Use   Vaping Use: Never used  Substance and Sexual Activity   Alcohol use: Yes      Comment: Rarely    Drug use: No   Sexual activity: Not Currently      Partners: Male        Objective:      Physical Exam    GENERAL APPEARANCE Comfortable, no acute issues Development: normal Gross deformities: none   SKIN Rash, lesions, ulcers: none Induration, erythema: none Nodules: none palpable   EYES Conjunctiva and lids: normal Pupils: equal and reactive   EARS, NOSE, MOUTH, THROAT External ears: no lesion or deformity External nose: no lesion or deformity Hearing: grossly normal   NECK Symmetric: no Trachea: midline Thyroid: Palpation of the right thyroid lobe shows no significant nodularity or mass.  On the left side there is a relatively firm large 4 cm or greater smooth nodule which is moderately tender to palpation.  It extends beneath the left clavicle.  There is no associated lymphadenopathy.     CHEST Respiratory effort: normal Retraction or accessory muscle use: no Breath sounds: normal bilaterally Rales, rhonchi, wheeze: none   CARDIOVASCULAR Auscultation: regular rhythm, normal rate Murmurs: none Pulses: radial pulse 2+ palpable Lower extremity edema: Mild edema bilaterally at the ankles   ABDOMEN Not assessed   GENITOURINARY/RECTAL Not assessed   MUSCULOSKELETAL Station and gait: normal Digits and nails: no clubbing or cyanosis Muscle strength: grossly normal all extremities Range of motion: grossly normal all extremities Deformity: none   LYMPHATIC Cervical: none palpable Supraclavicular: none palpable   PSYCHIATRIC Oriented to person, place, and time: yes Mood and  affect: normal for situation Judgment and insight: appropriate for situation         Assessment and Plan:  Diagnoses and all orders for this visit:   Thyroid nodule     Patient returns for follow-up of dominant left thyroid nodule.  In the interim, this has increased in size significantly.  Compressive symptoms have continued to progress and she now notes globus sensation, dyspnea with exertion, and pill dysphagia.  Patient would like to proceed with thyroid lobectomy.   Today we discussed left thyroid lobectomy.  We discussed the risk and benefits of the procedure including the risk of recurrent laryngeal nerve injury and injury to parathyroid glands.  We discussed the size and location of the surgical incision.  We discussed the hospital stay to be anticipated.  We discussed the potential need for lifelong thyroid hormone supplementation.  We discussed the potential need for additional surgery.  The patient understands and wishes to proceed with surgery in the near future.  Islay Polanco, MD [facility] practice Office: [phone]      

## 2022-08-14 NOTE — Telephone Encounter (Signed)
  Pt c/o medication issue:  1. Name of Medication: verapamil (CALAN-SR) 120 MG CR tablet   spironolactone (ALDACTONE) 25 MG tablet    2. How are you currently taking this medication (dosage and times per day)? As written  3. Are you having a reaction (difficulty breathing--STAT)? No   4. What is your medication issue? Pt  said, she picked up her  medication today. She said, she was told by nurse yesterday to take verapamil 2 tablet a day but the script said to take 1 tablet at bedtime. She is confused. Also, she want to know if she need to take her spironolactone with verapamil at the same time

## 2022-08-14 NOTE — Telephone Encounter (Signed)
Please see the MyChart message reply(ies) for my assessment and plan.    This patient gave consent for this Medical Advice Message and is aware that it may result in a bill to [employer], as well as the possibility of receiving a bill for a co-payment or deductible. They are an established patient, but are not seeking medical advice exclusively about a problem treated during an in person or video visit in the last seven days. I did not recommend an in person or video visit within seven days of my reply.    I spent a total of 12 minutes cumulative time within 7 days through [employer].  [name], MD

## 2022-08-14 NOTE — H&P (Signed)
PROVIDER:  Pressley Barsky Charlotta Newton, MD     Chief Complaint: Follow-up (1 yr follow up thyroid nodule)   History of Present Illness:   Patient returns for scheduled surgical follow-up having been evaluated last spring for dominant left-sided thyroid nodule.  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  Subsequent molecular genetic testing with Marijean Niemann was benign.  Decision was made to continue observation.  Patient underwent repeat ultrasound on August 01, 2022.  This again demonstrates a dominant nodule in the left thyroid lobe now measuring 4.9 x 3.7 x 2.9 cm, representing a significant increase in size.  Right thyroid lobe appears normal.  Patient describes a globus sensation.  She describes shortness of breath with vigorous physical activity.  She is having pill dysphagia.  Thyroid function remains normal at 1.90.  Patient presents today to discuss options for surgical management.  Patient continues to be followed closely by her endocrinologist, Dr. Philemon Kingdom.     Review of Systems: A complete review of systems was obtained from the patient.  I have reviewed this information and discussed as appropriate with the patient.  See HPI as well for other ROS.   Review of Systems  Constitutional: Negative.   HENT:         Globus sensation  Eyes: Negative.   Respiratory: Negative.    Cardiovascular: Negative.   Gastrointestinal:  Positive for heartburn.       Dysphagia (pills)  Genitourinary: Negative.   Musculoskeletal: Negative.   Skin: Negative.   Neurological: Negative.   Endo/Heme/Allergies: Negative.   Psychiatric/Behavioral: Negative.          Medical History: Past Medical History      Past Medical History:  Diagnosis Date   GERD (gastroesophageal reflux disease)     Hypertension             Patient Active Problem List  Diagnosis   Tingling   Chronic tension-type headache, not intractable   Muscle strain   Allergic rhinitis    Asthma, mild intermittent   Chronic neuropathic pain   Ear pain, right   HTN (hypertension)   Hypercholesteremia   Neck swelling   Numbness and tingling of both feet   Osteoarthritis of knee   Body mass index (BMI) 40.0-44.9, adult (CMS-HCC)   Bradycardia   Globus sensation   History of repair of hiatal hernia   Meningioma (CMS-HCC)   Status post laparoscopic sleeve gastrectomy   Varicose veins of bilateral lower extremities with other complications   Tinnitus of both ears   Thyroid nodule   Family history of aortic stenosis   Elevated lipase   Dysuria   Chronic venous insufficiency   Cervical radiculopathy   Cervical lymphadenitis   CAD (coronary artery disease)   Atrophic vaginitis   Leiomyoma of uterus, unspecified   Lymphedema   Morbid obesity (CMS-HCC)   Orthostatic hypotension   Osteoporosis   Palpitations   Skin lesion of scalp   Age-related osteoporosis without current pathological fracture   Breast pain, right   S/P breast lumpectomy      Past Surgical History       Past Surgical History:  Procedure Laterality Date   EGD   12/23/2019    Normal EGD biopsy/No Repeat/CTL   BIOPSY TEMPORAL ARTERY       CESAREAN SECTION       HERNIA REPAIR       KNEE ARTHROSCOPY Bilateral  Allergies       Allergies  Allergen Reactions   Wasp Venom Anaphylaxis   Chlorhexidine Gluconate Other (See Comments)      Itching under breasts   Metronidazole Other (See Comments)      Chest tightness, neck tightness   Prednisone Other (See Comments)      Severe migraine / with taper dose   Bleach (Sodium Hypochlorite) Rash      Liquid clorox bleach   Sodium Hypochlorite Rash      Liquid clorox bleach              Current Outpatient Medications on File Prior to Visit  Medication Sig Dispense Refill   spironolactone (ALDACTONE) 25 MG tablet Take 1 tablet by mouth once daily       verapamiL (CALAN-SR) 120 MG SR tablet Take by mouth       albuterol 90 mcg/actuation  inhaler Inhale into the lungs       azelastine (ASTELIN) 137 mcg nasal spray Place 1 spray into both nostrils 2 (two) times daily 10 mL 1   calcium carbonate 600 mg calcium (1,500 mg) Tab tablet Take 600 mg by mouth once daily.       cholecalciferol, vitamin D3, 400 unit Cap Take by mouth.       CYANOCOBALAMIN, VITAMIN B-12, SL Place under the tongue.       diltiazem (CARDIZEM CD) 180 MG CD capsule 120 mg       diphenhydrAMINE HCl 2 % Gel Apply 1 Application topically 2 (two) times daily 1 Tube 0   famotidine (PEPCID) 40 MG tablet Take 1 tablet (40 mg total) by mouth at bedtime 90 tablet 1   fluticasone (FLONASE) 50 mcg/actuation nasal spray         gabapentin (NEURONTIN) 300 MG capsule Take 300 mg by mouth 2 (two) times daily       hydroCHLOROthiazide (HYDRODIURIL) 25 MG tablet         mecobalamin/folic acid (OPURITY SL)         methocarbamoL (ROBAXIN) 500 MG tablet Take 1 tablet (500 mg total) by mouth at bedtime as needed for up to 180 days 30 tablet 3   montelukast (SINGULAIR) 10 mg tablet Take 10 mg by mouth at bedtime       nitroGLYcerin (NITROSTAT) 0.4 MG SL tablet         pantoprazole (PROTONIX) 40 MG DR tablet TAKE 1 TABLET BY MOUTH TWICE A DAY 180 tablet 3   rosuvastatin (CRESTOR) 10 MG tablet Take 10 mg by mouth once daily       traMADol (ULTRAM) 50 mg tablet         turm-ging-bos-yuc-wil-cham-hor 100-100-100-125 mg Tab Take by mouth once daily.       turmeric, bulk, 100 % Powd Take 1 tablet by mouth once daily        No current facility-administered medications on file prior to visit.      Family History       Family History  Problem Relation Age of Onset   Breast cancer Mother     Diabetes type I Mother     Diabetes type I Father     High blood pressure (Hypertension) Father     Diabetes type I Sister     High blood pressure (Hypertension) Sister     Diabetes type I Brother     High blood pressure (Hypertension) Brother     High blood pressure (Hypertension) Maternal  Aunt  Social History       Tobacco Use  Smoking Status Never  Smokeless Tobacco Never      Social History  Social History         Socioeconomic History   Marital status: Divorced  Tobacco Use   Smoking status: Never   Smokeless tobacco: Never  Vaping Use   Vaping Use: Never used  Substance and Sexual Activity   Alcohol use: Yes      Comment: Rarely    Drug use: No   Sexual activity: Not Currently      Partners: Male        Objective:      Physical Exam    GENERAL APPEARANCE Comfortable, no acute issues Development: normal Gross deformities: none   SKIN Rash, lesions, ulcers: none Induration, erythema: none Nodules: none palpable   EYES Conjunctiva and lids: normal Pupils: equal and reactive   EARS, NOSE, MOUTH, THROAT External ears: no lesion or deformity External nose: no lesion or deformity Hearing: grossly normal   NECK Symmetric: no Trachea: midline Thyroid: Palpation of the right thyroid lobe shows no significant nodularity or mass.  On the left side there is a relatively firm large 4 cm or greater smooth nodule which is moderately tender to palpation.  It extends beneath the left clavicle.  There is no associated lymphadenopathy.     CHEST Respiratory effort: normal Retraction or accessory muscle use: no Breath sounds: normal bilaterally Rales, rhonchi, wheeze: none   CARDIOVASCULAR Auscultation: regular rhythm, normal rate Murmurs: none Pulses: radial pulse 2+ palpable Lower extremity edema: Mild edema bilaterally at the ankles   ABDOMEN Not assessed   GENITOURINARY/RECTAL Not assessed   MUSCULOSKELETAL Station and gait: normal Digits and nails: no clubbing or cyanosis Muscle strength: grossly normal all extremities Range of motion: grossly normal all extremities Deformity: none   LYMPHATIC Cervical: none palpable Supraclavicular: none palpable   PSYCHIATRIC Oriented to person, place, and time: yes Mood and  affect: normal for situation Judgment and insight: appropriate for situation         Assessment and Plan:  Diagnoses and all orders for this visit:   Thyroid nodule     Patient returns for follow-up of dominant left thyroid nodule.  In the interim, this has increased in size significantly.  Compressive symptoms have continued to progress and she now notes globus sensation, dyspnea with exertion, and pill dysphagia.  Patient would like to proceed with thyroid lobectomy.   Today we discussed left thyroid lobectomy.  We discussed the risk and benefits of the procedure including the risk of recurrent laryngeal nerve injury and injury to parathyroid glands.  We discussed the size and location of the surgical incision.  We discussed the hospital stay to be anticipated.  We discussed the potential need for lifelong thyroid hormone supplementation.  We discussed the potential need for additional surgery.  The patient understands and wishes to proceed with surgery in the near future.  Armandina Gemma, MD [facility] [place] practice Office: [phone]

## 2022-08-14 NOTE — Telephone Encounter (Signed)
Spoke with patient.  Verified that the verapamil is twice a day and updated the prescription.  Recommended taking Spirolactone in the AM just incase it dose cause her to go to the restroom.

## 2022-08-14 NOTE — Telephone Encounter (Signed)
-----   Message from Ellamae Sia sent at 08/14/2022  2:29 PM EST ----- Regarding: Lab orders for Friday, 3.8.24 Lab order for potassium?

## 2022-08-15 ENCOUNTER — Ambulatory Visit: Payer: PPO | Admitting: Occupational Therapy

## 2022-08-15 ENCOUNTER — Encounter: Payer: Self-pay | Admitting: Internal Medicine

## 2022-08-15 ENCOUNTER — Ambulatory Visit (INDEPENDENT_AMBULATORY_CARE_PROVIDER_SITE_OTHER): Payer: HMO | Admitting: Pulmonary Disease

## 2022-08-15 ENCOUNTER — Encounter: Payer: Self-pay | Admitting: Pulmonary Disease

## 2022-08-15 VITALS — BP 120/70 | HR 70 | Temp 97.7°F | Ht 66.0 in | Wt 287.0 lb

## 2022-08-15 DIAGNOSIS — J4530 Mild persistent asthma, uncomplicated: Secondary | ICD-10-CM

## 2022-08-15 DIAGNOSIS — K219 Gastro-esophageal reflux disease without esophagitis: Secondary | ICD-10-CM

## 2022-08-15 DIAGNOSIS — Z01811 Encounter for preprocedural respiratory examination: Secondary | ICD-10-CM

## 2022-08-15 DIAGNOSIS — E049 Nontoxic goiter, unspecified: Secondary | ICD-10-CM | POA: Diagnosis not present

## 2022-08-15 NOTE — Progress Notes (Signed)
Subjective:    Patient ID: Kimberly Patton, female    DOB: [birth year], 67 y.o.   MRN: [id number] Patient Care Team: Jinny Sanders, MD as PCP - General (Family Medicine) [name], MD as PCP - Cardiology (Cardiology) Heniford, Esperanza Sheets, MD as Referring Physician (Surgery) Himmelrich, Bryson Ha, RD (Inactive) as Dietitian [facility])  Chief Complaint  Patient presents with   Follow-up    SOB with exertion. Occasional wheezing. Dry cough.     HPI Patient is a 67 year old lifelong never smoker who presents for follow-up on the issue of asthma.  I last saw the patient on 27 December 2020 which was her first visit with me at that time.  She did and subsequently saw the nurse practitioner and Dr. Chesley Mires.  She has had issues with mild persistent asthma and is now controlled on Symbicort and as needed albuterol.  The patient also in the past was worked up for a lung nodule in the right lower lobe which was noted to be inflammatory and resolved on subsequent CT.  She presents today for follow-up.  She saw Dr. Halford Chessman on 4 May with an acute visit and was treated for mild exacerbation of asthma and allergic rhinitis.  She has responded well to the interventions provided during that visit.  Today she presents with her sensation of dyspnea being at baseline.  She feels that her asthmatic symptoms are controlled.  She does note that her dyspnea worsened after she was given Prolia.  She has multiple issues that she believes are side effects from the Prolia.  Recently developed a right torn meniscus. Since her most recent visit here she has not had any fevers, chills or sweats.  No cough or sputum production.  No hemoptysis.  No orthopnea or paroxysmal nocturnal dyspnea.   PFTs performed on 30 November 2021 show an FEV1 of 1.86 L or 72% predicted, FVC of 2.64 L or 72% predicted, FEV1/FVC of 67%.  No significant bronchodilator response.  Lung volumes normal with some air trapping noted.  Diffusion capacity  normal.  ERV reduced to 24% indicating potential minimal restrictive component due to obesity.  There was no determination of regards to the small airways.  Given the air trapping and the slightly reduced FEV1/FVC ratio this is indicative of mild obstructive defect.  The degree of the obstruction may be underestimated due to mild concomitant restriction due to obesity.   Review of Systems A 10 point review of systems was performed and it is as noted above otherwise negative.  Patient Active Problem List   Diagnosis Date Noted   COVID-19 virus infection 04/30/2022   Paroxysmal atrial fibrillation ([place]) 03/28/2022   Empty sella syndrome ([place]) 03/28/2022   Varicose veins with inflammation 01/07/2022   S/P breast lumpectomy 10/10/2021   Cervical radiculopathy 06/28/2020   Chronic venous insufficiency 05/29/2020   Lymphedema 05/29/2020   CAD (coronary artery disease) 05/29/2020   Cervical lymphadenitis 04/18/2020   History of repair of hiatal hernia 02/02/2020   Orthostatic hypotension 01/01/2020   Hypercholesteremia 06/09/2019   Chronic neuropathic pain 07/31/2018   Thyroid nodule 04/15/2018   Skin lesion of scalp 09/06/2017   Age-related osteoporosis without current pathological fracture 12/04/2016   Osteoarthritis of knee 07/17/2016   Meningioma ([place]) 01/18/2016   Family history of aortic stenosis 01/20/2015   Status post laparoscopic sleeve gastrectomy Dec 2014 06/08/2013   Morbid obesity ([place]) 12/08/2012   Primary hypertension 10/11/2011   Varicose veins of bilateral lower extremities with other complications  06/18/2011   FIBROIDS, UTERUS 04/03/2007   Asthma, mild intermittent 04/03/2007   Social History   Tobacco Use   Smoking status: Never   Smokeless tobacco: Never   Tobacco comments:    smoke at age [age]  Substance Use Topics   Alcohol use: Not Currently   Allergies  Allergen Reactions   Wasp Venom Anaphylaxis   Chlorhexidine Gluconate     Itching under breasts    Metronidazole Other (See Comments)    Chest tightness, neck tightness   Prednisone Other (See Comments)    Severe migraine / with taper dose Told by Cardiologist to avoid due to Afib   Sodium Hypochlorite Rash    Liquid clorox bleach   Current Meds  Medication Sig   albuterol (VENTOLIN HFA) 108 (90 Base) MCG/ACT inhaler Inhale 2 puffs into the lungs every 6 (six) hours as needed for wheezing or shortness of breath.   Calcium Citrate-Vitamin D (CITRACAL + D PO) Take 2 tablets by mouth daily.    CARDIZEM 30 MG tablet Take 1 tablet (30 mg total) by mouth daily as needed (for elevated Heart rate).   Cholecalciferol (VITAMIN D3) 1.25 MG (50000 UT) capsule Take 1 capsule (50,000 Units total) by mouth once a week. Tuesday   Coenzyme Q10-Vitamin E (QUNOL ULTRA COQ10) 100-150 MG-UNIT CAPS Take 100 mg by mouth daily.   Cyanocobalamin (VITAMIN B-12) 5000 MCG SUBL Place 5,000 mcg under the tongue daily.   ELDERBERRY PO Take 1,000 mg by mouth daily.    famotidine (PEPCID) 40 MG tablet Take 40 mg by mouth at bedtime.   fluticasone (FLONASE) 50 MCG/ACT nasal spray SPRAY 2 SPRAYS INTO EACH NOSTRIL EVERY DAY   fluticasone-salmeterol (WIXELA INHUB) 250-50 MCG/ACT AEPB Inhale 1 puff into the lungs in the morning and at bedtime.   Lactobacillus Rhamnosus, GG, (CULTURELLE IMMUNITY SUPPORT PO) Take 1 capsule by mouth daily.   Menaquinone-7 (VITAMIN K2 PO) Take 1 tablet by mouth daily.   methocarbamol (ROBAXIN) 500 MG tablet Take 500 mg by mouth at bedtime as needed for muscle spasms.   montelukast (SINGULAIR) 10 MG tablet TAKE 1 TABLET BY MOUTH EVERYDAY AT BEDTIME (Patient taking differently: Take 10 mg by mouth at bedtime.)   Multiple Vitamins-Minerals (CENTRUM SILVER 50+WOMEN PO) Take 1 tablet by mouth daily.   nitroGLYCERIN (NITROSTAT) 0.4 MG SL tablet Place 1 tablet (0.4 mg total) under the tongue every 5 (five) minutes as needed for chest pain.   Omega-3 Fatty Acids (FISH OIL) 1200 MG CAPS Take 1 capsule  by mouth daily.   pantoprazole (PROTONIX) 40 MG tablet TAKE 1 TABLET BY MOUTH EVERY DAY (Patient taking differently: Take 40 mg by mouth daily.)   potassium chloride SA (KLOR-CON M) 20 MEQ tablet Take 2 tablets (40 mEq total) by mouth daily.   Riboflavin 100 MG CAPS Take 1 capsule by mouth daily.   rosuvastatin (CRESTOR) 10 MG tablet Take 1 tablet (10 mg total) by mouth daily.   spironolactone (ALDACTONE) 25 MG tablet Take 1 tablet (25 mg total) by mouth daily.   TURMERIC PO Take 2 tablets by mouth daily. With Ginger   verapamil (CALAN-SR) 120 MG CR tablet Take 1 tablet (120 mg total) by mouth 2 (two) times daily.   Zoledronic Acid (RECLAST IV) Inject into the vein. Yearly injection   Immunization History  Administered Date(s) Administered   Fluad Quad(high Dose 65+) 03/09/2021, 05/12/2022   Influenza Inj Mdck Quad Pf 02/13/2018   Influenza Whole 06/11/2005, 04/08/2009   Influenza,inj,Quad PF,6+  Mos 03/22/2017, 02/13/2018, 03/03/2019, 04/08/2020   Influenza-Unspecified 03/27/2016   PFIZER(Purple Top)SARS-COV-2 Vaccination 08/07/2019, 08/29/2019   PNEUMOCOCCAL CONJUGATE-20 03/09/2021   Pneumococcal Polysaccharide-23 03/22/2017   Td 06/11/2004   Tdap 01/20/2015       Objective:   Physical Exam BP 120/70 (BP Location: Left Arm, Cuff Size: Large)   Pulse 70   Temp 97.7 F (36.5 C)   Ht '5\' 6"'$  (1.676 m)   Wt 287 lb (130.2 kg)   SpO2 99%   BMI 46.32 kg/m   SpO2: 99 % O2 Device: None (Room air)  GENERAL: Morbidly obese woman, no acute distress, ambulatory with assistance of a cane.  No conversational dyspnea. HEAD: Normocephalic, atraumatic.  EYES: Pupils equal, round, reactive to light.  No scleral icterus.  MOUTH: Natural dentition, oral mucosa moist, no thrush. NECK: Supple.  Thyromegaly present particularly left lobe. Trachea midline. No JVD.  No adenopathy. PULMONARY: Good air entry bilaterally.  No adventitious sounds. CARDIOVASCULAR: S1 and S2. Regular rate and rhythm.   No rubs, murmurs or gallops heard. ABDOMEN: Obese otherwise benign. MUSCULOSKELETAL: No joint deformity, no clubbing, no edema.  Right knee on brace due to torn meniscus. NEUROLOGIC: No focal deficit, no gait disturbance, speech is fluent. SKIN: Intact,warm,dry.  Lipedema of the lower extremities.  Chronic stasis changes. PSYCH: Mood and behavior normal.        Assessment & Plan:     ICD-10-CM   1. Mild persistent asthma without complication  [id number]    Well compensated Should be able to tolerate surgery without difficulty Continue Wixela 250/50, 1 inhalation twice a day Continue as needed albuterol    2. Thyroid goiter  E04.9    Will require thyroidectomy Planned for 21 August 2022 Compromising airway    3. Chronic GERD  K21.9    Severe by UGI 04 July 2022 Continue Protonix Continue antireflux measures    4. Morbid obesity ([place])  E66.01    Issue adds complexity to her management Weight loss recommended    5. Pre-operative respiratory examination  Z01.811    Patient well compensated from respiratory standpoint From the pulmonary standpoint mild to moderate risk     Patient is to have thyroidectomy on 21 August 2022.  From the respiratory standpoint she appears to be well compensated on her current regimen of Wixela (generic Advair) and as needed albuterol.  Her risk are more related to her severe gastroesophageal reflux as demonstrated by esophagogram of 04 July 2022.  This will need to be taken into account when administering general anesthesia.  Other issues that can add to respiratory embarrassment postop is morbid obesity as this can lead to atelectasis.  Incentive spirometry and early mobilization postoperatively should help with this.  We will see the patient in follow-up in 6 months time she is to call sooner should any new problems arise.  Renold Don, MD Advanced Bronchoscopy P[facility]  [facility]    *This note was dictated using voice  recognition software/Dragon.  Despite best efforts to proofread, errors can occur which can change the meaning. Any transcriptional errors that result from this process are unintentional and may not be fully corrected at the time of dictation.

## 2022-08-15 NOTE — Progress Notes (Deleted)
   Subjective:    Patient ID: Kimberly Patton, female    DOB: 06-Jul-1955, 67 y.o.   MRN: [id number]  HPI    Review of Systems     Objective:   Physical Exam        Assessment & Plan:

## 2022-08-15 NOTE — Patient Instructions (Signed)
Continue using your Wixela as you are doing.  We will see you in follow-up in 6 months time call sooner should any new problems arise.

## 2022-08-17 ENCOUNTER — Other Ambulatory Visit (INDEPENDENT_AMBULATORY_CARE_PROVIDER_SITE_OTHER): Payer: HMO

## 2022-08-17 ENCOUNTER — Ambulatory Visit (INDEPENDENT_AMBULATORY_CARE_PROVIDER_SITE_OTHER): Payer: HMO | Admitting: Family Medicine

## 2022-08-17 ENCOUNTER — Encounter: Payer: Self-pay | Admitting: Family Medicine

## 2022-08-17 ENCOUNTER — Telehealth: Payer: Self-pay | Admitting: Cardiology

## 2022-08-17 ENCOUNTER — Encounter: Payer: Self-pay | Admitting: Cardiology

## 2022-08-17 ENCOUNTER — Telehealth: Payer: Self-pay | Admitting: Pulmonary Disease

## 2022-08-17 VITALS — BP 135/82 | HR 85 | Temp 98.1°F | Ht 66.0 in | Wt 284.4 lb

## 2022-08-17 DIAGNOSIS — E876 Hypokalemia: Secondary | ICD-10-CM | POA: Diagnosis not present

## 2022-08-17 DIAGNOSIS — E041 Nontoxic single thyroid nodule: Secondary | ICD-10-CM

## 2022-08-17 DIAGNOSIS — R002 Palpitations: Secondary | ICD-10-CM

## 2022-08-17 DIAGNOSIS — I10 Essential (primary) hypertension: Secondary | ICD-10-CM | POA: Diagnosis not present

## 2022-08-17 DIAGNOSIS — N3000 Acute cystitis without hematuria: Secondary | ICD-10-CM | POA: Diagnosis not present

## 2022-08-17 DIAGNOSIS — R350 Frequency of micturition: Secondary | ICD-10-CM

## 2022-08-17 LAB — POC URINALSYSI DIPSTICK (AUTOMATED)
Bilirubin, UA: NEGATIVE
Blood, UA: NEGATIVE
Glucose, UA: NEGATIVE
Ketones, UA: NEGATIVE
Nitrite, UA: NEGATIVE
Protein, UA: NEGATIVE
Spec Grav, UA: 1.015 (ref 1.010–1.025)
Urobilinogen, UA: 0.2 E.U./dL
pH, UA: 6 (ref 5.0–8.0)

## 2022-08-17 LAB — MAGNESIUM: Magnesium: 2 mg/dL (ref 1.5–2.5)

## 2022-08-17 LAB — POTASSIUM: Potassium: 4.6 mEq/L (ref 3.5–5.1)

## 2022-08-17 MED ORDER — VERAPAMIL HCL ER 120 MG PO TBCR: 120.0000 mg | EXTENDED_RELEASE_TABLET | Freq: Three times a day (TID) | ORAL | 6 refills | Status: DC

## 2022-08-17 MED ORDER — CEPHALEXIN 500 MG PO CAPS: 500.0000 mg | ORAL_CAPSULE | Freq: Two times a day (BID) | ORAL | 0 refills | Status: DC

## 2022-08-17 NOTE — Progress Notes (Unsigned)
Subjective:    Patient ID: Kimberly Patton, female    DOB: 04-22-1956, 67 y.o.   MRN: [id number]  HPI Here for urinary frequency  Also c/o palpitations She is getting ready for L thyroid lobectomy soon   Wt Readings from Last 3 Encounters:  08/17/22 284 lb 6 oz (129 kg)  08/15/22 287 lb (130.2 kg)  07/20/22 279 lb 3.2 oz (126.6 kg)   45.90 kg/m Taking GLP1 for obesity   Vitals:   08/17/22 1523 08/17/22 1602  BP: (!) 142/90 135/82  Pulse: 85   Temp: 98.1 F (36.7 C)   SpO2: 100%       Urinary symptoms  Frequent urination / more than she would expect with diuretic No burning with urination  No blood in urine   Some urinary odor  No n/v No fever   Is cold natured   Lab Results  Component Value Date   TSH 1.90 08/10/2022       Results for orders placed or performed in visit on 08/17/22  POCT Urinalysis Dipstick (Automated)  Result Value Ref Range   Color, UA Yellow    Clarity, UA Clear    Glucose, UA Negative Negative   Bilirubin, UA Negative    Ketones, UA Negative    Spec Grav, UA 1.015 1.010 - 1.025   Blood, UA Negative    pH, UA 6.0 5.0 - 8.0   Protein, UA Negative Negative   Urobilinogen, UA 0.2 0.2 or 1.0 E.U./dL   Nitrite, UA Negative    Leukocytes, UA Moderate (2+) (A) Negative   *Note: Due to a large number of results and/or encounters for the requested time period, some results have not been displayed. A complete set of results can be found in Results Review.     Palpitations  Sensation of difficulty breathing  Has called cardiology and pulmonary  EKG : nsr 82 with RBBB     Cardiac hx: h/o CAD and brief a fib and HTN  Dr Garen Lah Has h/o paroxysmal a fib - happened wit steroids once in the past    HTN Spironolactone 25 mg daily  Verapamil 120 mg tid     Pulm hx: h/o asthma  Dr Glendell Docker Thinks that her sob may be anxiety or thyroid related  Had f/u recently Pulse ox is normal today   BP Readings from Last 3  Encounters:  08/17/22 (!) 142/90  08/15/22 120/70  07/20/22 138/84   Pulse Readings from Last 3 Encounters:  08/17/22 85  08/15/22 70  07/20/22 79    Inst by her cardiologist today to inc verapamil to 120 mg tid if symptoms persist   Patient Active Problem List   Diagnosis Date Noted   Acute cystitis 08/17/2022   COVID-19 virus infection 04/30/2022   Paroxysmal atrial fibrillation ([place]) 03/28/2022   Empty sella syndrome ([place]) 03/28/2022   Varicose veins with inflammation 01/07/2022   S/P breast lumpectomy 10/10/2021   Cervical radiculopathy 06/28/2020   Chronic venous insufficiency 05/29/2020   Lymphedema 05/29/2020   CAD (coronary artery disease) 05/29/2020   Cervical lymphadenitis 04/18/2020   History of repair of hiatal hernia 02/02/2020   Orthostatic hypotension 01/01/2020   Hypercholesteremia 06/09/2019   Chronic neuropathic pain 07/31/2018   Thyroid nodule 04/15/2018   Skin lesion of scalp 09/06/2017   Age-related osteoporosis without current pathological fracture 12/04/2016   Osteoarthritis of knee 07/17/2016   Meningioma ([place]) 01/18/2016   Family history of aortic stenosis 01/20/2015   Status post laparoscopic  sleeve gastrectomy Dec 2014 06/08/2013   Morbid obesity ([place]) 12/08/2012   Primary hypertension 10/11/2011   Varicose veins of bilateral lower extremities with other complications [id number]   Palpitation 09/12/2010   FIBROIDS, UTERUS 04/03/2007   Asthma, mild intermittent 04/03/2007   Past Medical History:  Diagnosis Date   ALLERGIC RHINITIS 04/03/2007   Allergy    Not sure   Anemia    ((Pt Qnr Sub: Denies at visit from 08/28/2021 with endocrinology))    Anginal pain ([place])    Anxiety    Arthritis Not sure   Asthma    hx of years ago - no longer a problem    ASTHMA, PERSISTENT, MODERATE 04/03/2007   Cancer ([place])    skin    Dyspnea on exertion    a. 11/2007 Echo: EF 60%.   Dysrhythmia    hx of heart arrhythmia 10-15 years ago - followed by DR  Lovena Le - not seen in years    GERD (gastroesophageal reflux disease)    Hemophilia carrier    Hypercholesteremia 06/09/2019   Hypertension    Joint pain    HIPS / LEGS   Knee injury    RT   Meningioma ([place])    Morbid obesity ([place])    Neuromuscular disorder ([place]) Not sure, since my fall   Paroxysmal SVT (supraventricular tachycardia)    a. 11/2011 48h Holter: RSR, rare PVC's, occas PAC's   Pre-diabetes    Retinal tear of right eye 01/2016   Thyroid nodule 04/15/2018   Ventral hernia    Past Surgical History:  Procedure Laterality Date   [place]   ARTERY BIOPSY Left 03/21/2022   Procedure: BIOPSY TEMPORAL ARTERY;  Surgeon: Katha Cabal, MD;  Location: [facility] ORS;  Service: Vascular;  Laterality: Left;   BIOPSY THYROID     08/2021   BREAST CYST EXCISION Right 1994   Benign   CESAREAN SECTION     x2   CHOLECYSTECTOMY  1995   COLONOSCOPY WITH PROPOFOL N/A 05/09/2021   Procedure: COLONOSCOPY WITH PROPOFOL;  Surgeon: [name], MD;  Location: [facility] ENDOSCOPY;  Service: Gastroenterology;  Laterality: N/A;   EYE SURGERY  01/2016   Repair retinal tear   FOOT SURGERY  1995 / 1996   x2   HERNIA REPAIR     KNEE ARTHROSCOPY W/ MENISCAL REPAIR  07/25/2013   right knee Dr. Mardelle Matte   LAPAROSCOPIC GASTRIC SLEEVE RESECTION N/A 06/08/2013   Procedure: LAPAROSCOPIC GASTRIC SLEEVE RESECTION AND EXCISION OF SEBACEUS CYST FROM MID CHEST takedown of incarcerated ventral hernia and primary repair endoscopy;  Surgeon: [name], MD;  Location: [facility] ORS;  Service: General;  Laterality: N/A;   LAPAROSCOPIC NISSEN FUNDOPLICATION N/A [id number]   Procedure: LAPAROSCOPIC REPAIR LARGE SYMPTOMATIC HIATAL HERNIA WITH UPPER ENDOSCOPY;  Surgeon: Johnathan Hausen, MD;  Location: [facility] ORS;  Service: General;  Laterality: N/A;   moles  06/2013   removed 2 moles from under arm and  lowback   NECK SURGERY     occipital nerve damage- injections    OVARIAN  CYST [place] EXCISIONAL BREAST BIOPSY Right 06/23/2021   Procedure: RADIOACTIVE SEED GUIDED EXCISIONAL RIGHT BREAST BIOPSY;  Surgeon: Johnathan Hausen, MD;  Location: [facility];  Service: General;  Laterality: Right;   RETINAL TEAR REPAIR CRYOTHERAPY Right 01/2016   [place]   SKIN CANCER EXCISION  TONSILLECTOMY     TUBAL LIGATION  Think 1994   UPPER GI ENDOSCOPY  06/08/2013   Procedure: UPPER GI ENDOSCOPY;  Surgeon: [name], MD;  Location: [facility] ORS;  Service: General;;   US ECHOCARDIOGRAPHY  12/2011   WNL - EF 55-60%, mild MR, grade 1 diastolic dysnfiction (mild)   VENTRAL HERNIA REPAIR  2000   Social History   Tobacco Use   Smoking status: Never   Smokeless tobacco: Never   Tobacco comments:    smoke at age [age]  Vaping Use   Vaping Use: Never used  Substance Use Topics   Alcohol use: Not Currently   Drug use: No   Family History  Problem Relation Age of Onset   Breast cancer Mother    Diabetes Mother    Hypertension Mother    Kidney failure Mother    Diabetes Father    Hypertension Father    Diabetes Other    Hypertension Other    Stroke Other    Hemophilia Other    Allergies  Allergen Reactions   Wasp Venom Anaphylaxis   Chlorhexidine Gluconate     Itching under breasts   Metronidazole Other (See Comments)    Chest tightness, neck tightness   Prednisone Other (See Comments)    Severe migraine / with taper dose Told by Cardiologist to avoid due to Afib   Sodium Hypochlorite Rash    Liquid clorox bleach   Current Outpatient Medications on File Prior to Visit  Medication Sig Dispense Refill   albuterol (VENTOLIN HFA) 108 (90 Base) MCG/ACT inhaler Inhale 2 puffs into the lungs every 6 (six) hours as needed for wheezing or shortness of breath. 8 g 2   Calcium Citrate-Vitamin D (CITRACAL + D PO) Take 2 tablets by mouth daily.      CARDIZEM 30 MG tablet Take 1 tablet (30 mg  total) by mouth daily as needed (for elevated Heart rate). 30 tablet 3   Cholecalciferol (VITAMIN D3) 1.25 MG (50000 UT) capsule Take 1 capsule (50,000 Units total) by mouth once a week. Tuesday 12 capsule 0   Coenzyme Q10-Vitamin E (QUNOL ULTRA COQ10) 100-150 MG-UNIT CAPS Take 100 mg by mouth daily.     Cyanocobalamin (VITAMIN B-12) 2500 MCG SUBL Place 2,500 mcg under the tongue daily.     famotidine (PEPCID) 40 MG tablet Take 40 mg by mouth at bedtime.     fluticasone-salmeterol (WIXELA INHUB) 250-50 MCG/ACT AEPB Inhale 1 puff into the lungs in the morning and at bedtime. (Patient taking differently: Inhale 1 puff into the lungs daily.) 60 each 2   Lactobacillus Rhamnosus, GG, (CULTURELLE IMMUNITY SUPPORT PO) Take 1 capsule by mouth daily.     Menaquinone-7 (VITAMIN K2 PO) Take 100 mg by mouth daily.     methocarbamol (ROBAXIN) 500 MG tablet Take 500 mg by mouth at bedtime as needed for muscle spasms.     montelukast (SINGULAIR) 10 MG tablet TAKE 1 TABLET BY MOUTH EVERYDAY AT BEDTIME (Patient taking differently: Take 10 mg by mouth at bedtime.) 90 tablet 1   Multiple Vitamins-Minerals (CENTRUM SILVER 50+WOMEN PO) Take 1 tablet by mouth daily.     nitroGLYCERIN (NITROSTAT) 0.4 MG SL tablet Place 1 tablet (0.4 mg total) under the tongue every 5 (five) minutes as needed for chest pain. 25 tablet 3   Omega-3 Fatty Acids (FISH OIL) 1200 MG CAPS Take 1,200 mg by mouth daily.     pantoprazole (PROTONIX) 40 MG tablet TAKE 1 TABLET BY MOUTH  EVERY DAY (Patient taking differently: Take 40 mg by mouth daily.) 90 tablet 1   Riboflavin 400 MG CAPS Take 400 mg by mouth daily.     rosuvastatin (CRESTOR) 10 MG tablet Take 1 tablet (10 mg total) by mouth daily. (Patient taking differently: Take 10 mg by mouth at bedtime.) 90 tablet 3   spironolactone (ALDACTONE) 25 MG tablet Take 1 tablet (25 mg total) by mouth daily. 90 tablet 3   TURMERIC PO Take 2 tablets by mouth daily. 500 mg  With Ginger 50 mg Gummies      verapamil (CALAN-SR) 120 MG CR tablet Take 1 tablet (120 mg total) by mouth in the morning, at noon, and at bedtime. 90 tablet 6   Zoledronic Acid (RECLAST IV) Inject into the vein. Yearly injection     Semaglutide-Weight Management 0.25 MG/0.5ML SOAJ Inject 0.25 mg into the skin once a week. (Patient not taking: Reported on 08/17/2022)     Semaglutide-Weight Management 0.5 MG/0.5ML SOAJ Inject 0.5 mg into the skin once a week for 28 days. (Patient not taking: Reported on 06/29/2022) 2 mL 0   Semaglutide-Weight Management 1 MG/0.5ML SOAJ Inject 1 mg into the skin once a week for 28 days. (Patient not taking: Reported on 07/18/2022) 2 mL 0   Semaglutide-Weight Management 1.7 MG/0.75ML SOAJ Inject 1.7 mg into the skin once a week for 28 days. (Patient not taking: Reported on 08/17/2022) 3 mL 0   [START ON 09/10/2022] Semaglutide-Weight Management 2.4 MG/0.75ML SOAJ Inject 2.4 mg into the skin once a week for 28 days. (Patient not taking: Reported on 08/17/2022) 3 mL 0   Current Facility-Administered Medications on File Prior to Visit  Medication Dose Route Frequency Provider Last Rate Last Admin   albuterol (PROVENTIL) (2.5 MG/3ML) 0.083% nebulizer solution 2.5 mg  2.5 mg Nebulization Once Tyler Pita, MD        Review of Systems  Constitutional:  Positive for fatigue. Negative for activity change, appetite change, fever and unexpected weight change.  HENT:  Negative for congestion, ear pain, rhinorrhea, sinus pressure and sore throat.   Eyes:  Negative for pain, redness and visual disturbance.  Respiratory:  Positive for shortness of breath. Negative for cough, chest tightness, wheezing and stridor.   Cardiovascular:  Positive for palpitations. Negative for chest pain and leg swelling.  Gastrointestinal:  Negative for abdominal pain, blood in stool, constipation and diarrhea.  Endocrine: Negative for polydipsia and polyuria.  Genitourinary:  Positive for frequency and urgency. Negative for difficulty  urinating, dysuria and pelvic pain.  Musculoskeletal:  Negative for arthralgias, back pain and myalgias.  Skin:  Negative for pallor and rash.  Allergic/Immunologic: Negative for environmental allergies.  Neurological:  Negative for dizziness, syncope and headaches.  Hematological:  Negative for adenopathy. Does not bruise/bleed easily.  Psychiatric/Behavioral:  Negative for decreased concentration and dysphoric mood. The patient is not nervous/anxious.        Objective:   Physical Exam Constitutional:      General: She is not in acute distress.    Appearance: Normal appearance. She is well-developed. She is obese. She is not ill-appearing or diaphoretic.  HENT:     Head: Normocephalic and atraumatic.  Eyes:     Conjunctiva/sclera: Conjunctivae normal.     Pupils: Pupils are equal, round, and reactive to light.  Neck:     Comments: L sided thyroid enlargement  Non tender Cardiovascular:     Rate and Rhythm: Normal rate and regular rhythm.  Heart sounds: Normal heart sounds.  Pulmonary:     Effort: Pulmonary effort is normal.     Breath sounds: Normal breath sounds.  Abdominal:     General: Bowel sounds are normal. There is no distension.     Palpations: Abdomen is soft.     Tenderness: There is abdominal tenderness. There is no right CVA tenderness, left CVA tenderness, guarding or rebound.     Hernia: No hernia is present.     Comments: No cva tenderness  Mild suprapubic tenderness  Musculoskeletal:     Cervical back: Normal range of motion and neck supple.  Lymphadenopathy:     Cervical: No cervical adenopathy.  Skin:    Coloration: Skin is not pale.     Findings: No erythema or rash.  Neurological:     Mental Status: She is alert.     Cranial Nerves: No cranial nerve deficit.  Psychiatric:     Comments: Pt seems anxoius today but denies feeling anxious  Is frustrated by not feeling well   Pleasant            Assessment & Plan:   Problem List Items  Addressed This Visit       Cardiovascular and Mediastinum   Primary hypertension    bp in fair control at this time  Reviewed records from cardiology and pcp as well as last labs BP Readings from Last 1 Encounters:  08/17/22 135/82  No changes needed Will continue verapamil sr 120 mg daily and spironolactone 25 mg daily and cardiology f/u Most recent labs reviewed  Disc lifstyle change with low sodium diet and exercise          Endocrine   Thyroid nodule    Planning L thyroid lobectomy soon Reviewed records today        Genitourinary   Acute cystitis - Primary    Frequency -was unsure if due to spironolactone  Ua noted 2 plus leuk  Some malaise  Tx with keflex Culture pending  Will check in with pcp if not improving   ER precautions noted  Inst to call if symptoms worsen in meantime      Relevant Orders   Urine Culture (Completed)     Other   Palpitation    With h/o HTN and h/o a fib in the past  Reviewed cardiology notes today and last EKG  Today EKG is stable with NSR and RBBB- no sig changes from the past  Will continue her verapamil and check in with cardiology  ? If related to current uti      Relevant Orders   EKG 12-Lead (Completed)   Other Visit Diagnoses     Urinary frequency       Relevant Orders   POCT Urinalysis Dipstick (Automated) (Completed)

## 2022-08-17 NOTE — Telephone Encounter (Signed)
Called and spoke with patient. She reports waking up in the night and she couldn't breathe. She sat up and her heart was racing as well. She reports seeing Pulmonary and they said it was all cardiac and not her lungs. She states she is just not feeling good at all.   She reports that she was recently started on medications. All of her current cardiac medications are as follows:  Verapamil 120 mg Two times a day Spironolactone 25 mg once daily Cardizem 30 mg as needed for elevated heart rates.   Current blood pressure was 158/105 and HR 102. She wants to be seen today but advised that we do not have any appointments today. She does have scheduled appointment for 3/29 which was the soonest. She also has thyroid surgery next week. Reviewed that I will send this note to provider and he will provide his recommendations and then I will call her back.

## 2022-08-17 NOTE — Patient Instructions (Addendum)
You may have a uti  Drink water  Take the generic keflex as directed  We will get a culture and call you with a result and plan   Your exam is reassuring  Oxygen is normal  EKG is unchanged   Blood pressure is better on 2nd check

## 2022-08-17 NOTE — Telephone Encounter (Signed)
Pt c/o medication issue:  1. Name of Medication: CARDIZEM 30 MG tablet   2. How are you currently taking this medication (dosage and times per day)? Take 1 tablet (30 mg total) by mouth daily as needed (for elevated Heart rate).   3. Are you having a reaction (difficulty breathing--STAT)? No  4. What is your medication issue? Pt is requesting for clarification on when to take the medication. The pt stated their heart rate is currently 101 and the pt couldn't get a bp reading because the machine was too tight and she needed to take it off her arm. Pt stated that the bottom number must be high because it kept going up. Pt did send a mychart message as well. Please advise.

## 2022-08-17 NOTE — Telephone Encounter (Signed)
Ret Margie's call. Pls call back.

## 2022-08-17 NOTE — Patient Instructions (Addendum)
SURGICAL WAITING ROOM VISITATION Patients having surgery or a procedure may have no more than 2 support people in the waiting area - these visitors may rotate.    If the patient needs to stay at the hospital during part of their recovery, the visitor guidelines for inpatient rooms apply. Pre-op nurse will coordinate an appropriate time for 1 support person to accompany patient in pre-op.  This support person may not rotate.    Please refer to the [facility] website for the visitor guidelines for Inpatients (after your surgery is over and you are in a regular room).   Due to an increase in RSV and influenza rates and associated hospitalizations, children ages 76 and under may not visit patients in [place].     Your procedure is scheduled on: 08-21-22   Report to [facility] Main Entrance    Report to admitting at 5:15 AM   Call this number if you have problems the morning of surgery [phone]   Do not eat food :After Midnight.   After Midnight you may have the following liquids until 4:30 AM/ DAY OF SURGERY  Water Non-Citrus Juices (without pulp, NO RED) Carbonated Beverages Black Coffee (NO MILK/CREAM OR CREAMERS, sugar ok)  Clear Tea (NO MILK/CREAM OR CREAMERS, sugar ok) regular and decaf                             Plain Jell-O (NO RED)                                           Fruit ices (not with fruit pulp, NO RED)                                     Popsicles (NO RED)                                                               Sports drinks like Gatorade (NO RED)                       If you have questions, please contact your surgeon's office.   FOLLOW  ANY ADDITIONAL PRE OP INSTRUCTIONS YOU RECEIVED FROM YOUR SURGEON'S OFFICE!!!     Oral Hygiene is also important to reduce your risk of infection.                                    Remember - BRUSH YOUR TEETH THE MORNING OF SURGERY WITH YOUR REGULAR TOOTHPASTE   Do NOT smoke after  Midnight   Take these medicines the morning of surgery with A SIP OF WATER:   Cardizem  Pantoprazole  Verapamil  DO NOT TAKE ANY ORAL DIABETIC MEDICATIONS DAY OF YOUR SURGERY  Bring CPAP mask and tubing day of surgery.  You may not have any metal on your body including hair pins, jewelry, and body piercing             Do not wear make-up, lotions, powders, perfumes or deodorant  Do not wear nail polish including gel and S&S, artificial/acrylic nails, or any other type of covering on natural nails including finger and toenails. If you have artificial nails, gel coating, etc. that needs to be removed by a nail salon please have this removed prior to surgery or surgery may need to be canceled/ delayed if the surgeon/ anesthesia feels like they are unable to be safely monitored.   Do not shave  48 hours prior to surgery.         Do not bring valuables to the hospital. [place].   Contacts, dentures or bridgework may not be worn into surgery.   Bring small overnight bag day of surgery.   DO NOT [place]. PHARMACY WILL DISPENSE MEDICATIONS LISTED ON YOUR MEDICATION LIST TO YOU DURING YOUR ADMISSION [place]!    Special Instructions: Bring a copy of your healthcare power of attorney and living will documents the day of surgery if you haven't scanned them before.              Please read over the following fact sheets you were given: IF [place]  If you received a COVID test during your pre-op visit  it is requested that you wear a mask when out in public, stay away from anyone that may not be feeling well and notify your surgeon if you develop symptoms. If you test positive for Covid or have been in contact with anyone that has tested positive in the last 10 days please notify you surgeon.  [place] - Preparing  for Surgery Before surgery, you can play an important role.  Because skin is not sterile, your skin needs to be as free of germs as possible.  You can reduce the number of germs on your skin by washing with CHG (chlorahexidine gluconate) soap before surgery.  CHG is an antiseptic cleaner which kills germs and bonds with the skin to continue killing germs even after washing. Please DO NOT use if you have an allergy to CHG or antibacterial soaps.  If your skin becomes reddened/irritated stop using the CHG and inform your nurse when you arrive at Short Stay. Do not shave (including legs and underarms) for at least 48 hours prior to the first CHG shower.  You may shave your face/neck.  Please follow these instructions carefully:  1.  Shower with CHG Soap the night before surgery and the  morning of surgery.  2.  If you choose to wash your hair, wash your hair first as usual with your normal  shampoo.  3.  After you shampoo, rinse your hair and body thoroughly to remove the shampoo.                             4.  Use CHG as you would any other liquid soap.  You can apply chg directly to the skin and wash.  Gently with a scrungie or clean washcloth.  5.  Apply the CHG Soap to your body ONLY FROM THE NECK DOWN.   Do   not use on face/ open  Wound or open sores. Avoid contact with eyes, ears mouth and   genitals (private parts).                       Wash face,  Genitals (private parts) with your normal soap.             6.  Wash thoroughly, paying special attention to the area where your    surgery  will be performed.  7.  Thoroughly rinse your body with warm water from the neck down.  8.  DO NOT shower/wash with your normal soap after using and rinsing off the CHG Soap.                9.  Pat yourself dry with a clean towel.            10.  Wear clean pajamas.            11.  Place clean sheets on your bed the night of your first shower and do not  sleep with pets. Day of  Surgery : Do not apply any lotions/deodorants the morning of surgery.  Please wear clean clothes to the hospital/surgery center.  FAILURE TO FOLLOW THESE INSTRUCTIONS MAY RESULT IN THE CANCELLATION OF YOUR SURGERY  PATIENT SIGNATURE_________________________________  NURSE SIGNATURE__________________________________  ________________________________________________________________________

## 2022-08-17 NOTE — Telephone Encounter (Signed)
See telephone encounter.

## 2022-08-17 NOTE — Telephone Encounter (Signed)
She has a goiter in her thyroid but I do not feel that this may be related to that.  We recently saw her and she had no lung issue itself.  Given that her heart rate is up and that she is becoming short of breath when she lays down it may be a cardiac issue.  If she follows with cardiology I would give them a call if not it would be prudent for her to be evaluated in the emergency room.

## 2022-08-17 NOTE — Telephone Encounter (Signed)
Lm x1 for patient.  

## 2022-08-17 NOTE — Telephone Encounter (Signed)
Pt calling in bc last night she was having trouble breathing, states her heart rate is up.

## 2022-08-17 NOTE — Telephone Encounter (Signed)
Spoke with patient and reviewed provider recommendations. She states that she just doesn't feel right. She mentioned all of her symptoms and that she is going to see her primary care provider today. Advised to call back if needed. She verbalized understanding with no further questions at this time.    [name], MD  Sent: Fri August 17, 2022  1:37 PM   Message  Increase verapamil to 120 mg 3 times daily.  If symptoms persist, patient should go to the emergency room in order to get evaluated.

## 2022-08-17 NOTE — Progress Notes (Signed)
COVID Vaccine Completed:  Yes  Date of COVID positive in last 4 days:No  PCP - [name], MD Cardiologist - [name], MD Pulmonologist - Vernard Gambles, MD Neurologist - Jennings Books, MD  Chest x-ray - 03-24-22 Epic EKG - 07-19-22 Epic Stress Test - 01-25-20 Epic ECHO - 09-28-21 Epic Cardiac Cath - N/A Pacemaker/ICD device last checked: Spinal Cord Stimulator:N/A Long Term Monitor - 04-19-22 Epic Coronary CT - 09-21-21 Epic  Bowel Prep - N/A  Sleep Study - N/A CPAP -   Fasting Blood Sugar - N/A Checks Blood Sugar _____ times a day  Has not started Semaglutide for weight loss Last dose of GLP1 agonist-  N/A GLP1 instructions:  N/A   Last dose of SGLT-2 inhibitors-  N/A SGLT-2 instructions: N/A  Blood Thinner Instructions:  N/A Aspirin Instructions: Last Dose:  Activity level:  Able to perform activities of daily living without stopping and without symptoms of chest pain or shortness of breath.  Able to climb stairs due does get "winded" due to 80 pound weight gain. Also has limitations due to knee pain.  Anesthesia review: Afib, CAD, SVT, HTN  Patient denies shortness of breath, fever, cough and chest pain at PAT appointment  Patient verbalized understanding of instructions that were given to them at the PAT appointment. Patient was also instructed that they will need to review over the PAT instructions again at home before surgery.

## 2022-08-17 NOTE — Telephone Encounter (Signed)
Patient is aware of below message/recommendations and voiced her understanding.  She stated that she has a call in to cardiology. She is waiting to hear back.  Nothing further needed at this time.

## 2022-08-17 NOTE — Telephone Encounter (Signed)
Called and spoke with patient. Patient stated that she woke up last night and had a lot of trouble breathing. She stated that she propped herself up and used her inhaler but she still had trouble breathing. Patient did say she does have a nodule on her thyroid and she has surgery Tuesday for that. Patient stated that her blood pressure has been up and her heart rate has been up from being so anxious. Patient wants to know if the nodule is causing this.   CG, please advise.

## 2022-08-17 NOTE — Telephone Encounter (Signed)
Patient called stating some at her PCP's office is going to see her today at 3:30pm.  She states she has been two days on the new med something is not right.

## 2022-08-18 LAB — URINE CULTURE
MICRO NUMBER:: 14668403
SPECIMEN QUALITY:: ADEQUATE

## 2022-08-19 ENCOUNTER — Encounter: Payer: Self-pay | Admitting: Family Medicine

## 2022-08-19 NOTE — Assessment & Plan Note (Addendum)
With h/o HTN and h/o a fib in the past  Reviewed cardiology notes today and last EKG  Today EKG is stable with NSR and RBBB- no sig changes from the past  Will continue her verapamil and check in with cardiology  ? If related to current uti

## 2022-08-19 NOTE — Assessment & Plan Note (Addendum)
bp in fair control at this time  Reviewed records from cardiology and pcp as well as last labs BP Readings from Last 1 Encounters:  08/17/22 135/82   No changes needed Will continue verapamil sr 120 mg daily and spironolactone 25 mg daily and cardiology f/u Most recent labs reviewed  Disc lifstyle change with low sodium diet and exercise

## 2022-08-19 NOTE — Assessment & Plan Note (Addendum)
Planning L thyroid lobectomy soon Reviewed records today

## 2022-08-19 NOTE — Assessment & Plan Note (Addendum)
Frequency -was unsure if due to spironolactone  Ua noted 2 plus leuk  Some malaise  Tx with keflex Culture pending  Will check in with pcp if not improving   ER precautions noted  Inst to call if symptoms worsen in meantime

## 2022-08-20 ENCOUNTER — Other Ambulatory Visit: Payer: Self-pay

## 2022-08-20 ENCOUNTER — Encounter (HOSPITAL_COMMUNITY)
Admission: RE | Admit: 2022-08-20 | Discharge: 2022-08-20 | Disposition: A | Discharge status: Home / Self Care | Payer: HMO | Source: Ambulatory Visit | Attending: Surgery | Admitting: Surgery

## 2022-08-20 ENCOUNTER — Encounter (HOSPITAL_COMMUNITY): Payer: Self-pay

## 2022-08-20 ENCOUNTER — Encounter: Payer: HMO | Admitting: Occupational Therapy

## 2022-08-20 ENCOUNTER — Encounter (HOSPITAL_COMMUNITY): Payer: Self-pay | Admitting: Surgery

## 2022-08-20 VITALS — BP 142/85 | HR 72 | Temp 97.9°F | Resp 20 | Ht 66.0 in | Wt 281.2 lb

## 2022-08-20 DIAGNOSIS — Z01812 Encounter for preprocedural laboratory examination: Secondary | ICD-10-CM | POA: Diagnosis not present

## 2022-08-20 DIAGNOSIS — Z01818 Encounter for other preprocedural examination: Secondary | ICD-10-CM

## 2022-08-20 HISTORY — DX: Headache, unspecified: R51.9

## 2022-08-20 HISTORY — DX: Personal history of urinary calculi: Z87.442

## 2022-08-20 HISTORY — DX: Dyspnea, unspecified: R06.00

## 2022-08-20 LAB — CBC
HCT: 37.2 % (ref 36.0–46.0)
Hemoglobin: 11.5 g/dL — ABNORMAL LOW (ref 12.0–15.0)
MCH: 26.3 pg (ref 26.0–34.0)
MCHC: 30.9 g/dL (ref 30.0–36.0)
MCV: 84.9 fL (ref 80.0–100.0)
Platelets: 319 10*3/uL (ref 150–400)
RBC: 4.38 MIL/uL (ref 3.87–5.11)
RDW: 14.9 % (ref 11.5–15.5)
WBC: 6.1 10*3/uL (ref 4.0–10.5)
nRBC: 0 % (ref 0.0–0.2)

## 2022-08-20 NOTE — Telephone Encounter (Signed)
Will route to Dr. Glori Bickers who saw her and sent her a message about her results

## 2022-08-20 NOTE — Progress Notes (Signed)
Anesthesia Chart Review   Case: Kimberly Patton Date/Time: 08/21/22 0715   Procedure: LEFT THYROID LOBECTOMY (Left)   Anesthesia type: General   Pre-op diagnosis: LEFT THYROID NODULE   Location[facility] ROOM 04 / [facility]   Surgeons: Armandina Gemma, MD       DISCUSSION:66 y.o. never smoker with h/o asthma, HTN, remote h/o a-fib, RBBB, left thyroid nodule scheduled for above procedure 08/21/2022 with Dr. Armandina Gemma.   Pt last seen by pulmonology 08/15/2022. Per OV note, "Patient is to have thyroidectomy on 21 August 2022. From the respiratory standpoint she appears to be well compensated on her current regimen of Wixela (generic Advair) and as needed albuterol. Her risk are more related to her severe gastroesophageal reflux as demonstrated by esophagogram of 04 July 2022. This will need to be taken into account when administering general anesthesia. Other issues that can add to respiratory embarrassment postop is morbid obesity as this can lead to atelectasis. Incentive spirometry and early mobilization postoperatively should help with this."  Pt last seen by cardiology 07/20/2022. Nonobstructive CAD, remote h/o a-fib in setting of temporal artery biopsy and prednisone use.   Last seen by PCP 08/17/2022, stable at this visit.  VS: There were no vitals taken for this visit.  PROVIDERS: Jinny Sanders, MD is PCP    LABS: Labs reviewed: Acceptable for surgery. (all labs ordered are listed, but only abnormal results are displayed)  Labs Reviewed - No data to display   IMAGES:   EKG:   CV: Echo 09/28/2021 1. Left ventricular ejection fraction, by estimation, is 55 to 60%. The  left ventricle has normal function. The left ventricle has no regional  wall motion abnormalities. Left ventricular diastolic parameters were  normal. The average left ventricular  global longitudinal strain is -20.5 %. The global longitudinal strain is  normal.   2. Right ventricular systolic function is normal. The right  ventricular  size is normal.   3. The mitral valve is normal in structure. Trivial mitral valve  regurgitation.   4. The aortic valve is tricuspid. Aortic valve regurgitation is not  visualized.   5. The inferior vena cava is normal in size with greater than 50%  respiratory variability, suggesting right atrial pressure of 3 mmHg.  Past Medical History:  Diagnosis Date   ALLERGIC RHINITIS 04/03/2007   Allergy    Not sure   Anemia    ((Pt Qnr Sub: Denies at visit from 08/28/2021 with endocrinology))    Anginal pain ([place])    Anxiety    Arthritis Not sure   Asthma    hx of years ago - no longer a problem    ASTHMA, PERSISTENT, MODERATE 04/03/2007   Cancer ([place])    skin    Dyspnea    with exertion   Dyspnea on exertion    a. 11/2007 Echo: EF 60%.   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 11/2011 48h Holter: RSR,  rare PVC's, occas PAC's   Retinal tear of right eye 01/2016   Thyroid nodule 04/15/2018   Ventral hernia     Past Surgical History:  Procedure Laterality Date   ABDOMINAL HYSTERECTOMY  [place] 1994   APPENDECTOMY  1996   ARTERY BIOPSY Left 03/21/2022   Procedure: BIOPSY TEMPORAL ARTERY;  Surgeon: Katha Cabal, MD;  Location: [facility] ORS;  Service: Vascular;  Laterality: Left;   BIOPSY THYROID     08/2021   BREAST CYST EXCISION Right 1994   Benign   CESAREAN SECTION     x2   CHOLECYSTECTOMY  1995   COLONOSCOPY WITH PROPOFOL N/A 05/09/2021   Procedure: COLONOSCOPY WITH PROPOFOL;  Surgeon: [name], MD;  Location: [facility] ENDOSCOPY;  Service:  Gastroenterology;  Laterality: N/A;   EYE SURGERY  01/2016   Repair retinal tear   FOOT SURGERY  1995 / 1996   x2   HERNIA REPAIR     KNEE ARTHROSCOPY W/ MENISCAL REPAIR  07/25/2013   right knee Dr. Mardelle Matte   LAPAROSCOPIC GASTRIC SLEEVE RESECTION N/A 06/08/2013   Procedure: LAPAROSCOPIC GASTRIC SLEEVE RESECTION AND EXCISION OF SEBACEUS CYST FROM MID CHEST takedown of incarcerated ventral hernia and primary repair endoscopy;  Surgeon: [name], MD;  [facility];  Service: General;  Laterality: N/A;   LAPAROSCOPIC NISSEN FUNDOPLICATION N/A [id number]   Procedure: LAPAROSCOPIC REPAIR LARGE SYMPTOMATIC HIATAL HERNIA WITH UPPER ENDOSCOPY;  Surgeon: Johnathan Hausen, MD;  Location[facility];  Service: General;  Laterality: N/A;   moles  06/2013   removed 2 moles from under arm and  lowback   NECK SURGERY     occipital nerve damage- injections    OVARIAN CYST [place] EXCISIONAL BREAST BIOPSY Right 06/23/2021   Procedure: RADIOACTIVE SEED GUIDED EXCISIONAL RIGHT BREAST BIOPSY;  Surgeon: Johnathan Hausen, MD;  Location: [facility];  Service: General;  Laterality: Right;   RETINAL TEAR REPAIR CRYOTHERAPY Right 01/2016   [place]   SKIN CANCER EXCISION     TONSILLECTOMY     TUBAL LIGATION  Think 1994   UPPER GI ENDOSCOPY  06/08/2013   Procedure: UPPER GI ENDOSCOPY;  Surgeon: [name], MD;  [facility];  Service: General;;   US ECHOCARDIOGRAPHY  12/2011   WNL - EF 55-60%, mild MR, grade 1 diastolic dysnfiction (mild)   VENTRAL HERNIA REPAIR  2000    MEDICATIONS: No current facility-administered medications for this encounter.    albuterol (VENTOLIN HFA) 108 (90 Base) MCG/ACT inhaler   Calcium Citrate-Vitamin D (CITRACAL + D PO)   CARDIZEM 30 MG tablet   Cholecalciferol (VITAMIN D3) 1.25 MG (50000 UT) capsule   Coenzyme Q10-Vitamin E (QUNOL ULTRA COQ10) 100-150 MG-UNIT CAPS   Cyanocobalamin  (VITAMIN B-12) 2500 MCG SUBL   famotidine (PEPCID) 40 MG tablet   fluticasone-salmeterol (WIXELA INHUB) 250-50 MCG/ACT AEPB   Lactobacillus Rhamnosus, GG, (CULTURELLE IMMUNITY SUPPORT PO)   Menaquinone-7 (VITAMIN K2 PO)   methocarbamol (ROBAXIN) 500 MG tablet   montelukast (SINGULAIR) 10 MG tablet   Multiple Vitamins-Minerals (CENTRUM SILVER 50+WOMEN PO)   nitroGLYCERIN (NITROSTAT) 0.4 MG SL tablet   Omega-3 Fatty Acids (FISH OIL) 1200 MG CAPS   pantoprazole (PROTONIX) 40 MG tablet   Riboflavin 400 MG CAPS   rosuvastatin (CRESTOR) 10 MG tablet   spironolactone (ALDACTONE) 25 MG tablet   TURMERIC PO   Zoledronic Acid (RECLAST IV)   cephALEXin (KEFLEX) 500  MG capsule   Semaglutide-Weight Management 0.25 MG/0.5ML SOAJ   Semaglutide-Weight Management 0.5 MG/0.5ML SOAJ   Semaglutide-Weight Management 1 MG/0.5ML SOAJ   Semaglutide-Weight Management 1.7 MG/0.75ML SOAJ   [START ON 09/10/2022] Semaglutide-Weight Management 2.4 MG/0.75ML SOAJ   verapamil (CALAN-SR) 120 MG CR tablet    albuterol (PROVENTIL) (2.5 MG/3ML) 0.083% nebulizer solution 2.5 mg     [facility] Ward, PA-C [facility] [phone]

## 2022-08-21 ENCOUNTER — Encounter: Payer: Self-pay | Admitting: Cardiology

## 2022-08-21 ENCOUNTER — Encounter (HOSPITAL_COMMUNITY): Payer: Self-pay | Admitting: Surgery

## 2022-08-21 ENCOUNTER — Ambulatory Visit (HOSPITAL_COMMUNITY)
Admission: RE | Admit: 2022-08-21 | Discharge: 2022-08-22 | Disposition: A | Discharge status: Home / Self Care | Payer: HMO | Attending: Surgery | Admitting: Surgery

## 2022-08-21 ENCOUNTER — Encounter (HOSPITAL_COMMUNITY)
Admission: RE | Disposition: A | Discharge status: Home / Self Care | Payer: Self-pay | Source: Home / Self Care | Attending: Surgery

## 2022-08-21 ENCOUNTER — Ambulatory Visit (HOSPITAL_BASED_OUTPATIENT_CLINIC_OR_DEPARTMENT_OTHER): Payer: HMO | Admitting: Certified Registered"

## 2022-08-21 ENCOUNTER — Ambulatory Visit (HOSPITAL_COMMUNITY): Payer: HMO | Admitting: Certified Registered"

## 2022-08-21 DIAGNOSIS — I10 Essential (primary) hypertension: Secondary | ICD-10-CM

## 2022-08-21 DIAGNOSIS — Z6841 Body Mass Index (BMI) 40.0 and over, adult: Secondary | ICD-10-CM

## 2022-08-21 DIAGNOSIS — J45909 Unspecified asthma, uncomplicated: Secondary | ICD-10-CM

## 2022-08-21 DIAGNOSIS — E041 Nontoxic single thyroid nodule: Secondary | ICD-10-CM

## 2022-08-21 DIAGNOSIS — Z79899 Other long term (current) drug therapy: Secondary | ICD-10-CM | POA: Diagnosis not present

## 2022-08-21 DIAGNOSIS — D34 Benign neoplasm of thyroid gland: Secondary | ICD-10-CM | POA: Diagnosis not present

## 2022-08-21 HISTORY — PX: THYROID LOBECTOMY: SHX420

## 2022-08-21 SURGERY — LOBECTOMY, THYROID
Anesthesia: General | Laterality: Left

## 2022-08-21 MED ORDER — FENTANYL CITRATE (PF) 250 MCG/5ML IJ SOLN
INTRAMUSCULAR | Status: AC
  Filled 2022-08-21: qty 5

## 2022-08-21 MED ORDER — ONDANSETRON HCL 4 MG/2ML IJ SOLN: 4.0000 mg | Freq: Four times a day (QID) | INTRAMUSCULAR | Status: DC | PRN

## 2022-08-21 MED ORDER — ACETAMINOPHEN 325 MG PO TABS
650.0000 mg | ORAL_TABLET | Freq: Four times a day (QID) | ORAL | Status: DC | PRN
  Administered 2022-08-21 – 2022-08-22 (×2): 650 mg via ORAL
  Filled 2022-08-21 (×2): qty 2

## 2022-08-21 MED ORDER — SUGAMMADEX SODIUM 200 MG/2ML IV SOLN
INTRAVENOUS | Status: DC | PRN
  Administered 2022-08-21: 300 mg via INTRAVENOUS

## 2022-08-21 MED ORDER — ROCURONIUM BROMIDE 10 MG/ML (PF) SYRINGE
PREFILLED_SYRINGE | INTRAVENOUS | Status: AC
  Filled 2022-08-21: qty 10

## 2022-08-21 MED ORDER — DILTIAZEM HCL 30 MG PO TABS: 30.0000 mg | ORAL_TABLET | Freq: Every day | ORAL | Status: DC | PRN

## 2022-08-21 MED ORDER — OXYCODONE HCL 5 MG/5ML PO SOLN: 5.0000 mg | Freq: Once | ORAL | Status: DC | PRN

## 2022-08-21 MED ORDER — LACTATED RINGERS IV SOLN: INTRAVENOUS | Status: DC

## 2022-08-21 MED ORDER — MIDAZOLAM HCL 5 MG/5ML IJ SOLN
INTRAMUSCULAR | Status: DC | PRN
  Administered 2022-08-21: 2 mg via INTRAVENOUS

## 2022-08-21 MED ORDER — HYDROMORPHONE HCL 1 MG/ML IJ SOLN: 1.0000 mg | INTRAMUSCULAR | Status: DC | PRN

## 2022-08-21 MED ORDER — 0.9 % SODIUM CHLORIDE (POUR BTL) OPTIME
TOPICAL | Status: DC | PRN
  Administered 2022-08-21: 1000 mL

## 2022-08-21 MED ORDER — FLUTICASONE FUROATE-VILANTEROL 200-25 MCG/ACT IN AEPB
1.0000 | INHALATION_SPRAY | Freq: Every day | RESPIRATORY_TRACT | Status: DC
  Administered 2022-08-22: 1 via RESPIRATORY_TRACT
  Filled 2022-08-21: qty 28

## 2022-08-21 MED ORDER — KETOROLAC TROMETHAMINE 30 MG/ML IJ SOLN
30.0000 mg | Freq: Once | INTRAMUSCULAR | Status: AC | PRN
  Administered 2022-08-21: 30 mg via INTRAVENOUS

## 2022-08-21 MED ORDER — FAMOTIDINE 20 MG PO TABS
40.0000 mg | ORAL_TABLET | Freq: Every day | ORAL | Status: DC
  Administered 2022-08-21: 40 mg via ORAL
  Filled 2022-08-21: qty 2

## 2022-08-21 MED ORDER — PROPOFOL 10 MG/ML IV BOLUS
INTRAVENOUS | Status: DC | PRN
  Administered 2022-08-21: 150 mg via INTRAVENOUS

## 2022-08-21 MED ORDER — VERAPAMIL HCL ER 120 MG PO TBCR
120.0000 mg | EXTENDED_RELEASE_TABLET | Freq: Every day | ORAL | Status: DC
  Administered 2022-08-22: 120 mg via ORAL
  Filled 2022-08-21: qty 1

## 2022-08-21 MED ORDER — ORAL CARE MOUTH RINSE
15.0000 mL | Freq: Once | OROMUCOSAL | Status: AC
  Administered 2022-08-21: 15 mL via OROMUCOSAL

## 2022-08-21 MED ORDER — OXYCODONE HCL 5 MG PO TABS: 5.0000 mg | ORAL_TABLET | ORAL | Status: DC | PRN

## 2022-08-21 MED ORDER — HYDROMORPHONE HCL 1 MG/ML IJ SOLN
INTRAMUSCULAR | Status: AC
  Administered 2022-08-21: 0.5 mg via INTRAVENOUS
  Filled 2022-08-21: qty 1

## 2022-08-21 MED ORDER — HYDROMORPHONE HCL 1 MG/ML IJ SOLN
0.2500 mg | INTRAMUSCULAR | Status: DC | PRN
  Administered 2022-08-21 (×3): 0.5 mg via INTRAVENOUS

## 2022-08-21 MED ORDER — OXYCODONE HCL 5 MG PO TABS: 5.0000 mg | ORAL_TABLET | Freq: Once | ORAL | Status: DC | PRN

## 2022-08-21 MED ORDER — ONDANSETRON HCL 4 MG/2ML IJ SOLN
INTRAMUSCULAR | Status: DC | PRN
  Administered 2022-08-21: 4 mg via INTRAVENOUS

## 2022-08-21 MED ORDER — SPIRONOLACTONE 25 MG PO TABS
25.0000 mg | ORAL_TABLET | Freq: Every day | ORAL | Status: DC
  Administered 2022-08-22: 25 mg via ORAL
  Filled 2022-08-21: qty 1

## 2022-08-21 MED ORDER — DEXAMETHASONE SODIUM PHOSPHATE 10 MG/ML IJ SOLN
INTRAMUSCULAR | Status: AC
  Filled 2022-08-21: qty 1

## 2022-08-21 MED ORDER — HEMOSTATIC AGENTS (NO CHARGE) OPTIME
TOPICAL | Status: DC | PRN
  Administered 2022-08-21: 1 via TOPICAL

## 2022-08-21 MED ORDER — LIDOCAINE 2% (20 MG/ML) 5 ML SYRINGE
INTRAMUSCULAR | Status: DC | PRN
  Administered 2022-08-21: 100 mg via INTRAVENOUS

## 2022-08-21 MED ORDER — ACETAMINOPHEN 10 MG/ML IV SOLN
INTRAVENOUS | Status: DC | PRN
  Administered 2022-08-21: 1000 mg via INTRAVENOUS

## 2022-08-21 MED ORDER — CHLORHEXIDINE GLUCONATE 0.12 % MT SOLN: 15.0000 mL | Freq: Once | OROMUCOSAL | Status: DC

## 2022-08-21 MED ORDER — COENZYME Q10-VITAMIN E 100-150 MG-UNIT PO CAPS: 100.0000 mg | ORAL_CAPSULE | Freq: Every day | ORAL | Status: DC

## 2022-08-21 MED ORDER — CEFAZOLIN IN SODIUM CHLORIDE 3-0.9 GM/100ML-% IV SOLN
3.0000 g | INTRAVENOUS | Status: AC
  Administered 2022-08-21: 2 g via INTRAVENOUS
  Filled 2022-08-21: qty 100

## 2022-08-21 MED ORDER — MIDAZOLAM HCL 2 MG/2ML IJ SOLN
INTRAMUSCULAR | Status: AC
  Filled 2022-08-21: qty 2

## 2022-08-21 MED ORDER — ACETAMINOPHEN 650 MG RE SUPP: 650.0000 mg | Freq: Four times a day (QID) | RECTAL | Status: DC | PRN

## 2022-08-21 MED ORDER — ONDANSETRON 4 MG PO TBDP: 4.0000 mg | ORAL_TABLET | Freq: Four times a day (QID) | ORAL | Status: DC | PRN

## 2022-08-21 MED ORDER — SUCCINYLCHOLINE CHLORIDE 200 MG/10ML IV SOSY
PREFILLED_SYRINGE | INTRAVENOUS | Status: AC
  Filled 2022-08-21: qty 10

## 2022-08-21 MED ORDER — ACETAMINOPHEN 10 MG/ML IV SOLN
INTRAVENOUS | Status: AC
  Filled 2022-08-21: qty 100

## 2022-08-21 MED ORDER — MONTELUKAST SODIUM 10 MG PO TABS
10.0000 mg | ORAL_TABLET | Freq: Every day | ORAL | Status: DC
  Administered 2022-08-21: 10 mg via ORAL
  Filled 2022-08-21: qty 1

## 2022-08-21 MED ORDER — TRAMADOL HCL 50 MG PO TABS
50.0000 mg | ORAL_TABLET | Freq: Four times a day (QID) | ORAL | Status: DC | PRN
  Administered 2022-08-21: 50 mg via ORAL
  Filled 2022-08-21: qty 1

## 2022-08-21 MED ORDER — SODIUM CHLORIDE 0.45 % IV SOLN: INTRAVENOUS | Status: DC

## 2022-08-21 MED ORDER — HYDROMORPHONE HCL 1 MG/ML IJ SOLN
INTRAMUSCULAR | Status: AC
  Filled 2022-08-21: qty 1

## 2022-08-21 MED ORDER — PROPOFOL 10 MG/ML IV BOLUS
INTRAVENOUS | Status: AC
  Filled 2022-08-21: qty 20

## 2022-08-21 MED ORDER — ROCURONIUM BROMIDE 10 MG/ML (PF) SYRINGE
PREFILLED_SYRINGE | INTRAVENOUS | Status: DC | PRN
  Administered 2022-08-21: 40 mg via INTRAVENOUS
  Administered 2022-08-21: 60 mg via INTRAVENOUS

## 2022-08-21 MED ORDER — KETOROLAC TROMETHAMINE 30 MG/ML IJ SOLN
INTRAMUSCULAR | Status: AC
  Filled 2022-08-21: qty 1

## 2022-08-21 MED ORDER — ONDANSETRON HCL 4 MG/2ML IJ SOLN: 4.0000 mg | Freq: Once | INTRAMUSCULAR | Status: DC | PRN

## 2022-08-21 MED ORDER — FENTANYL CITRATE (PF) 100 MCG/2ML IJ SOLN
INTRAMUSCULAR | Status: DC | PRN
  Administered 2022-08-21 (×2): 50 ug via INTRAVENOUS

## 2022-08-21 MED ORDER — ONDANSETRON HCL 4 MG/2ML IJ SOLN
INTRAMUSCULAR | Status: AC
  Filled 2022-08-21: qty 2

## 2022-08-21 MED ORDER — DEXAMETHASONE SODIUM PHOSPHATE 10 MG/ML IJ SOLN
INTRAMUSCULAR | Status: DC | PRN
  Administered 2022-08-21: 10 mg via INTRAVENOUS

## 2022-08-21 SURGICAL SUPPLY — 32 items
ADH SKN CLS APL DERMABOND .7 (GAUZE/BANDAGES/DRESSINGS) ×1
APL PRP STRL LF DISP 70% ISPRP (MISCELLANEOUS) ×1
ATTRACTOMAT 16X20 MAGNETIC DRP (DRAPES) ×1 IMPLANT
BAG COUNTER SPONGE SURGICOUNT (BAG) ×1 IMPLANT
BAG SPNG CNTER NS LX DISP (BAG) ×1
BLADE SURG 15 STRL LF DISP TIS (BLADE) ×1 IMPLANT
BLADE SURG 15 STRL SS (BLADE) ×1
CHLORAPREP W/TINT 26 (MISCELLANEOUS) ×1 IMPLANT
CLIP TI MEDIUM 6 (CLIP) ×2 IMPLANT
CLIP TI WIDE RED SMALL 6 (CLIP) ×2 IMPLANT
COVER SURGICAL LIGHT HANDLE (MISCELLANEOUS) ×1 IMPLANT
DERMABOND ADVANCED .7 DNX12 (GAUZE/BANDAGES/DRESSINGS) ×1 IMPLANT
DRAPE LAPAROTOMY T 98X78 PEDS (DRAPES) ×1 IMPLANT
DRAPE UTILITY XL STRL (DRAPES) ×1 IMPLANT
ELECT PENCIL ROCKER SW 15FT (MISCELLANEOUS) ×1 IMPLANT
ELECT REM PT RETURN 15FT ADLT (MISCELLANEOUS) ×1 IMPLANT
GAUZE 4X4 16PLY ~~LOC~~+RFID DBL (SPONGE) ×1 IMPLANT
GLOVE SURG ORTHO 8.0 STRL STRW (GLOVE) ×1 IMPLANT
GOWN STRL REUS W/ TWL XL LVL3 (GOWN DISPOSABLE) ×2 IMPLANT
GOWN STRL REUS W/TWL XL LVL3 (GOWN DISPOSABLE) ×2
HEMOSTAT SURGICEL 2X4 FIBR (HEMOSTASIS) ×1 IMPLANT
ILLUMINATOR WAVEGUIDE N/F (MISCELLANEOUS) ×1 IMPLANT
KIT BASIN OR (CUSTOM PROCEDURE TRAY) ×1 IMPLANT
KIT TURNOVER KIT A (KITS) IMPLANT
PACK BASIC VI WITH GOWN DISP (CUSTOM PROCEDURE TRAY) ×1 IMPLANT
SHEARS HARMONIC 9CM CVD (BLADE) ×1 IMPLANT
SUT MNCRL AB 4-0 PS2 18 (SUTURE) ×1 IMPLANT
SUT VIC AB 3-0 SH 18 (SUTURE) ×2 IMPLANT
SYR BULB IRRIG 60ML STRL (SYRINGE) ×1 IMPLANT
TOWEL OR 17X26 10 PK STRL BLUE (TOWEL DISPOSABLE) ×1 IMPLANT
TOWEL OR NON WOVEN STRL DISP B (DISPOSABLE) ×1 IMPLANT
TUBING CONNECTING 10 (TUBING) ×1 IMPLANT

## 2022-08-21 NOTE — Anesthesia Preprocedure Evaluation (Signed)
Anesthesia Evaluation  Patient identified by MRN, date of birth, ID band Patient awake    Reviewed: Allergy & Precautions, H&P , NPO status , Patient's Chart, lab work & pertinent test results  Airway Mallampati: III  TM Distance: <3 FB Neck ROM: Full    Dental no notable dental hx.    Pulmonary asthma    Pulmonary exam normal breath sounds clear to auscultation       Cardiovascular hypertension, Pt. on medications Normal cardiovascular exam Rhythm:Regular Rate:Normal     Neuro/Psych negative neurological ROS  negative psych ROS   GI/Hepatic negative GI ROS, Neg liver ROS,,,  Endo/Other    Morbid obesity  Renal/GU negative Renal ROS  negative genitourinary   Musculoskeletal negative musculoskeletal ROS (+)    Abdominal  (+) + obese  Peds negative pediatric ROS (+)  Hematology negative hematology ROS (+)   Anesthesia Other Findings   Reproductive/Obstetrics negative OB ROS                             Anesthesia Physical Anesthesia Plan  ASA: 3  Anesthesia Plan: General   Post-op Pain Management: Tylenol PO (pre-op)*   Induction: Intravenous  PONV Risk Score and Plan: 3 and Ondansetron, Dexamethasone, Midazolam and Treatment may vary due to age or medical condition  Airway Management Planned: Oral ETT  Additional Equipment:   Intra-op Plan:   Post-operative Plan: Extubation in OR  Informed Consent: I have reviewed the patients History and Physical, chart, labs and discussed the procedure including the risks, benefits and alternatives for the proposed anesthesia with the patient or authorized representative who has indicated his/her understanding and acceptance.     Dental advisory given  Plan Discussed with: CRNA and Surgeon  Anesthesia Plan Comments:        Anesthesia Quick Evaluation

## 2022-08-21 NOTE — Op Note (Signed)
Procedure Note  Pre-operative Diagnosis:  left thyroid nodule  Post-operative Diagnosis:  same  Surgeon:  Armandina Gemma, MD  Assistant:  none   Procedure:  Left thyroid lobectomy and isthmusectomy  Anesthesia:  General  Estimated Blood Loss:  25 cc  Drains: none         Specimen: thyroid lobe to pathology  Indications:  Patient returns for scheduled surgical follow-up having been evaluated last spring for dominant left-sided thyroid nodule.  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  Subsequent molecular genetic testing with Marijean Niemann was benign.  Decision was made to continue observation.  Patient underwent repeat ultrasound on August 01, 2022.  This again demonstrates a dominant nodule in the left thyroid lobe now measuring 4.9 x 3.7 x 2.9 cm, representing a significant increase in size.  Right thyroid lobe appears normal.  Patient describes a globus sensation.  She describes shortness of breath with vigorous physical activity.  She is having pill dysphagia.  Thyroid function remains normal at 1.90.  Patient presents today to discuss options for surgical management.  Patient continues to be followed closely by her endocrinologist, Dr. Philemon Kingdom.   Procedure Details: Procedure was done in OR #4 at the [facility]. The patient was brought to the operating room and placed in a supine position on the operating room table. Following administration of general anesthesia, the patient was positioned and then prepped and draped in the usual aseptic fashion. After ascertaining that an adequate level of anesthesia had been achieved, a small Kocher incision was made with #15 blade. Dissection was carried through subcutaneous tissues and platysma. Hemostasis was achieved with the electrocautery. Skin flaps were elevated cephalad and caudad from the thyroid notch to the sternal notch. A self-retaining retractor was placed for exposure. Strap muscles were incised in  the midline and dissection was begun on the left side. Strap muscles were reflected laterally. The left thyroid lobe was moderately enlarged by what appears to be a single large central nodule replacing most of the left lobe. The lobe was gently mobilized with blunt dissection. Superior pole vessels were dissected out and divided individually between small and medium ligaclips with the harmonic scalpel. The thyroid lobe was rolled anteriorly. Branches of the inferior thyroid artery were divided between small ligaclips with the harmonic scalpel. Inferior venous tributaries were divided between ligaclips. Both the superior and inferior parathyroid glands were identified and preserved on their vascular pedicles. The recurrent laryngeal nerve was identified and preserved along its course. The ligament of Gwenlyn Found was released with the electrocautery and the gland was mobilized onto the anterior trachea. Isthmus was mobilized across the midline. There was no significant pyramidal lobe present. The thyroid parenchyma was transected at the junction of the isthmus and contralateral thyroid lobe with the harmonic scalpel. The thyroid lobe and isthmus were submitted to pathology for review.  The entire field was palpated for evidence of lymphadenopathy or extra-thyroidal disease.  No worrisome findings were noted.  No enlarged lymph nodes were identified.  The neck was irrigated with warm saline. Fibrillar was placed throughout the operative field. Strap muscles were approximated in the midline with interrupted 3-0 Vicryl sutures. Platysma was closed with interrupted 3-0 Vicryl sutures. Skin was closed with a running 4-0 Monocryl subcuticular suture.  Wound was washed and dried and Dermabond was applied. The patient was awakened from anesthesia and brought to the recovery room. The patient tolerated the procedure well.   Armandina Gemma, [facility] Office: [phone]

## 2022-08-21 NOTE — Interval H&P Note (Signed)
History and Physical Interval Note:  08/21/2022 7:02 AM  Kimberly Patton  has presented today for surgery, with the diagnosis of LEFT THYROID NODULE.  The various methods of treatment have been discussed with the patient and family. After consideration of risks, benefits and other options for treatment, the patient has consented to    Procedure(s): LEFT THYROID LOBECTOMY (Left) as a surgical intervention.    The patient's history has been reviewed, patient examined, no change in status, stable for surgery.  I have reviewed the patient's chart and labs.  Questions were answered to the patient's satisfaction.    Armandina Gemma, [facility] [place] practice Office: [place]

## 2022-08-21 NOTE — Anesthesia Procedure Notes (Signed)
Procedure Name: Intubation Date/Time: 08/21/2022 7:35 AM  Performed by: Cleda Daub, CRNAPre-anesthesia Checklist: Patient identified, Emergency Drugs available, Suction available and Patient being monitored Patient Re-evaluated:Patient Re-evaluated prior to induction Oxygen Delivery Method: Circle system utilized Preoxygenation: Pre-oxygenation with 100% oxygen Induction Type: IV induction Ventilation: Mask ventilation without difficulty and Oral airway inserted - appropriate to patient size Laryngoscope Size: Mac and 3 Grade View: Grade I Tube type: Oral Number of attempts: 1 Airway Equipment and Method: Stylet and Oral airway Placement Confirmation: ETT inserted through vocal cords under direct vision, positive ETCO2 and breath sounds checked- equal and bilateral Secured at: 21 cm Tube secured with: Tape Dental Injury: Teeth and Oropharynx as per pre-operative assessment

## 2022-08-21 NOTE — Anesthesia Postprocedure Evaluation (Signed)
Anesthesia Post Note  Patient: Kimberly Patton  Procedure(s) Performed: LEFT THYROID LOBECTOMY (Left)     Patient location during evaluation: PACU Anesthesia Type: General Level of consciousness: awake and alert Pain management: pain level controlled Vital Signs Assessment: post-procedure vital signs reviewed and stable Respiratory status: spontaneous breathing, nonlabored ventilation, respiratory function stable and patient connected to nasal cannula oxygen Cardiovascular status: blood pressure returned to baseline and stable Postop Assessment: no apparent nausea or vomiting Anesthetic complications: no  No notable events documented.  Last Vitals:  Vitals:   08/21/22 0900 08/21/22 0903  BP: (!) 165/108 (!) 151/91  Pulse: 89 89  Resp:  14  Temp: 36.7 C   SpO2: 100% 100%    Last Pain:  Vitals:   08/21/22 0634  TempSrc:   PainSc: 5                  [name] S

## 2022-08-21 NOTE — Transfer of Care (Signed)
Immediate Anesthesia Transfer of Care Note  Patient: Kimberly Patton  Procedure(s) Performed: LEFT THYROID LOBECTOMY (Left)  Patient Location: PACU  Anesthesia Type:General  Level of Consciousness: awake, alert , oriented, and patient cooperative  Airway & Oxygen Therapy: Patient Spontanous Breathing and Patient connected to face mask oxygen  Post-op Assessment: Report given to RN and Post -op Vital signs reviewed and stable  Post vital signs: Reviewed and stable  Last Vitals:  Vitals Value Taken Time  BP 151/91 08/21/22 0903  Temp 36.7 C 08/21/22 0900  Pulse 85 08/21/22 0904  Resp 14 08/21/22 0904  SpO2 100 % 08/21/22 0904  Vitals shown include unvalidated device data.  Last Pain:  Vitals:   08/21/22 0634  TempSrc:   PainSc: 5       Patients Stated Pain Goal: 3 ([id number])  Complications: No notable events documented.

## 2022-08-21 NOTE — Progress Notes (Signed)
     Patient seen and examined.  Voice good.  Mild STS.  Dermabond in place.  Pain controlled.  Doing well post op.  Plan home in AM after breakfast.  Armandina Gemma, MD [facility] A [place] practice Office: [phone]

## 2022-08-22 ENCOUNTER — Encounter (HOSPITAL_COMMUNITY): Payer: Self-pay | Admitting: Surgery

## 2022-08-22 ENCOUNTER — Encounter: Payer: Self-pay | Admitting: Internal Medicine

## 2022-08-22 ENCOUNTER — Other Ambulatory Visit (HOSPITAL_COMMUNITY): Payer: Self-pay

## 2022-08-22 ENCOUNTER — Telehealth: Payer: Self-pay

## 2022-08-22 DIAGNOSIS — E041 Nontoxic single thyroid nodule: Secondary | ICD-10-CM | POA: Diagnosis not present

## 2022-08-22 LAB — SURGICAL PATHOLOGY

## 2022-08-22 MED ORDER — TRAMADOL HCL 50 MG PO TABS: 50.0000 mg | ORAL_TABLET | Freq: Four times a day (QID) | ORAL | 0 refills | Status: AC | PRN

## 2022-08-22 NOTE — Discharge Instructions (Signed)
[facility] [place] [facility] - Dr. Rhaya Coale  THYROID & PARATHYROID SURGERY:  POST-OP INSTRUCTIONS  Always review the instruction sheet provided by the hospital nurse at discharge.  A prescription for pain medication may be sent to your pharmacy at the time of discharge.  Take your pain medication as prescribed.  If narcotic pain medicine is not needed, then you may take acetaminophen (Tylenol) or ibuprofen (Advil) as needed for pain or soreness.  Take your normal home medications as prescribed unless otherwise directed.  If you need a refill on your pain medication, please contact the office during regular business hours.  Prescriptions will not be processed by the office after 5:00PM or on weekends.  Start with a light diet upon arrival home, such as soup and crackers or toast.  Be sure to drink plenty of fluids.  Resume your normal diet the day after surgery.  Most patients will experience some swelling and bruising on the chest and neck area.  Ice packs will help for the first 48 hours after arriving home.  Swelling and bruising will take several days to resolve.   It is common to experience some constipation after surgery.  Increasing fluid intake and taking a stool softener (Colace) will usually help to prevent this problem.  A mild laxative (Milk of Magnesia or Miralax) should be taken according to package directions if there has been no bowel movement after 48 hours.  Dermabond glue covers your incision. This seals the wound and you may shower at any time. The Dermabond will remain in place for about a week.  You may gradually remove the glue when it loosens around the edges.  If you need to loosen the Dermabond for removal, apply a layer of Vaseline to the wound for 15 minutes and then remove with a Kleenex. Your sutures are under the skin and will not show - they will dissolve on their own.  You may resume light daily activities beginning the day after discharge (such as self-care,  walking, climbing stairs), gradually increasing activities as tolerated. You may have sexual intercourse when it is comfortable. Refrain from any heavy lifting or straining until approved by your doctor. You may drive when you no longer are taking prescription pain medication, you can comfortably wear a seatbelt, and you can safely maneuver your car and apply the brakes.  You will see your doctor in the office for a follow-up appointment approximately three weeks after your surgery.  Make sure that you call for this appointment within a day or two after you arrive home to insure a convenient appointment time. Please have any requested laboratory tests performed a few days prior to your office visit so that the results will be available at your follow up appointment.  WHEN TO CALL THE [facility] OFFICE: -- Fever greater than 101.5 -- Inability to urinate -- Nausea and/or vomiting - persistent -- Extreme swelling or bruising -- Continued bleeding from incision -- Increased pain, redness, or drainage from the incision -- Difficulty swallowing or breathing -- Muscle cramping or spasms -- Numbness or tingling in hands or around lips  The clinic staff is available to answer your questions during regular business hours.  Please don't hesitate to call and ask to speak to one of the nurses if you have concerns.  [facility] OFFICE: [phone] (24 hours)  Please sign up for MyChart accounts. This will allow you to communicate directly with my nurse or myself without having to call the office. It will also allow you   to view your test results. You will need to enroll in MyChart for my office ([facility]) and for the hospital ([place]).  Jamerson Vonbargen, MD [place] [facility] A [facility] practice 

## 2022-08-22 NOTE — Progress Notes (Signed)
Discharge instructions discussed with patient, verbalized agreement and understanding 

## 2022-08-22 NOTE — Discharge Summary (Signed)
Physician Discharge Summary   Patient ID: Kimberly Patton MRN: [id number] DOB/AGE: [birth year] 67 y.o.  Admit date: 08/21/2022  Discharge date: 08/22/2022  Discharge Diagnoses:  Principal Problem:   Thyroid nodule Active Problems:   Left thyroid nodule   Discharged Condition: good  Hospital Course: Patient was admitted for observation following thyroid surgery.  Post op course was uncomplicated.  Pain was well controlled.  Tolerated diet.  Patient was prepared for discharge home on POD#1.  Consults: None  Treatments: surgery: thyroid lobectomy  Discharge Exam: Blood pressure (!) 145/81, pulse 74, temperature (!) 97.4 F (36.3 C), temperature source Oral, resp. rate 18, height '5\' 6"'$  (1.676 m), weight 127.6 kg, SpO2 97 %. HEENT - clear Neck - wound dry and intact; mild STS; mild ecchymosis; voice normal; Dermabond in place  Disposition: Home  Discharge Instructions     Diet - low sodium heart healthy   Complete by: As directed    Increase activity slowly   Complete by: As directed    No dressing needed   Complete by: As directed       Allergies as of 08/22/2022       Reactions   Wasp Venom Anaphylaxis   Chlorhexidine Gluconate    Itching under breasts   Metronidazole Other (See Comments)   Chest tightness, neck tightness   Prednisone Other (See Comments)   Severe migraine / with taper dose Told by Cardiologist to avoid due to Afib   Sodium Hypochlorite Rash   Liquid clorox bleach        Medication List     TAKE these medications    albuterol 108 (90 Base) MCG/ACT inhaler Commonly known as: VENTOLIN HFA Inhale 2 puffs into the lungs every 6 (six) hours as needed for wheezing or shortness of breath.   Cardizem 30 MG tablet Generic drug: diltiazem Take 1 tablet (30 mg total) by mouth daily as needed (for elevated Heart rate).   CENTRUM SILVER 50+WOMEN PO Take 1 tablet by mouth daily.   cephALEXin 500 MG capsule Commonly known as: KEFLEX Take 1  capsule (500 mg total) by mouth 2 (two) times daily.   CITRACAL + D PO Take 2 tablets by mouth daily.   CULTURELLE IMMUNITY SUPPORT PO Take 1 capsule by mouth daily.   famotidine 40 MG tablet Commonly known as: PEPCID Take 40 mg by mouth at bedtime.   Fish Oil 1200 MG Caps Take 1,200 mg by mouth daily.   fluticasone-salmeterol 250-50 MCG/ACT Aepb Commonly known as: Wixela Inhub Inhale 1 puff into the lungs in the morning and at bedtime. What changed: when to take this   methocarbamol 500 MG tablet Commonly known as: ROBAXIN Take 500 mg by mouth at bedtime as needed for muscle spasms.   montelukast 10 MG tablet Commonly known as: SINGULAIR TAKE 1 TABLET BY MOUTH EVERYDAY AT BEDTIME What changed: See the new instructions.   nitroGLYCERIN 0.4 MG SL tablet Commonly known as: NITROSTAT Place 1 tablet (0.4 mg total) under the tongue every 5 (five) minutes as needed for chest pain.   pantoprazole 40 MG tablet Commonly known as: PROTONIX TAKE 1 TABLET BY MOUTH EVERY DAY   Qunol Ultra CoQ10 100-150 MG-UNIT Caps Generic drug: Coenzyme Q10-Vitamin E Take 100 mg by mouth daily.   RECLAST IV Inject into the vein. Yearly injection   Riboflavin 400 MG Caps Take 400 mg by mouth daily.   rosuvastatin 10 MG tablet Commonly known as: CRESTOR Take 1 tablet (10 mg  total) by mouth daily. What changed: when to take this   Semaglutide-Weight Management 0.25 MG/0.5ML Soaj Inject 0.25 mg into the skin once a week.   Semaglutide-Weight Management 0.5 MG/0.5ML Soaj Inject 0.5 mg into the skin once a week for 28 days.   Semaglutide-Weight Management 1 MG/0.5ML Soaj Inject 1 mg into the skin once a week for 28 days.   Semaglutide-Weight Management 1.7 MG/0.75ML Soaj Inject 1.7 mg into the skin once a week for 28 days.   Semaglutide-Weight Management 2.4 MG/0.75ML Soaj Inject 2.4 mg into the skin once a week for 28 days. Start taking on: September 10, 2022   spironolactone 25 MG  tablet Commonly known as: ALDACTONE Take 1 tablet (25 mg total) by mouth daily.   traMADol 50 MG tablet Commonly known as: ULTRAM Take 1 tablet (50 mg total) by mouth every 6 (six) hours as needed.   TURMERIC PO Take 2 tablets by mouth daily. 500 mg  With Ginger 50 mg Gummies   verapamil 120 MG CR tablet Commonly known as: CALAN-SR Take 1 tablet (120 mg total) by mouth in the morning, at noon, and at bedtime.   Vitamin B-12 2500 MCG Subl Place 2,500 mcg under the tongue daily.   Vitamin D3 1.25 MG (50000 UT) capsule Generic drug: Cholecalciferol Take 1 capsule (50,000 Units total) by mouth once a week. Tuesday   VITAMIN K2 PO Take 100 mg by mouth daily.               Discharge Care Instructions  (From admission, onward)           Start     Ordered   08/22/22 0000  No dressing needed        08/22/22 0840            Follow-up Information     Armandina Gemma, MD. Schedule an appointment as soon as possible for a visit in 3 week(s).   Specialty: General Surgery Why: For wound re-check Contact information: [address] [place] [address] [phone]                 Jessy Calixte, [facility] Office: [phone]   Signed: Armandina Gemma 08/22/2022, 8:41 AM

## 2022-08-22 NOTE — TOC CM/SW Note (Signed)
Transition of Care [facility]) Screening Note  Patient Details  Name: Kimberly Patton Date of Birth: [birth year]  Transition of Care [facility]) CM/SW Contact:    Sherie Don, LCSW Phone Number: 08/22/2022, 8:51 AM  Transition of Care Department [facility]) has reviewed patient and no TOC needs have been identified at this time. We will continue to monitor patient advancement through interdisciplinary progression rounds. If new patient transition needs arise, please place a TOC consult.

## 2022-08-22 NOTE — Progress Notes (Signed)
Good news.  Final pathology is benign.  [place], MD [facility] [place] practice Office: [phone]

## 2022-08-22 NOTE — Telephone Encounter (Signed)
Test claim

## 2022-08-24 ENCOUNTER — Encounter: Payer: HMO | Admitting: Occupational Therapy

## 2022-08-24 NOTE — Telephone Encounter (Signed)
Called [employer].   Spoke with someone in the PA department.  She stated that they sent [place] a tier exception for march 1ST and it was denied March 9th.  I asked her to provide me the denial reason because I have received this form. She stated it was denied because they did not receive any information.   Made her aware that I have not received anything and that I would need her to refax [place] the denial letter and appeal.

## 2022-08-27 ENCOUNTER — Encounter: Payer: HMO | Admitting: Occupational Therapy

## 2022-08-29 ENCOUNTER — Encounter: Payer: HMO | Admitting: Occupational Therapy

## 2022-08-30 ENCOUNTER — Emergency Department
Admission: EM | Admit: 2022-08-30 | Discharge: 2022-08-31 | Disposition: A | Discharge status: Home / Self Care | Payer: HMO | Attending: Emergency Medicine | Admitting: Emergency Medicine

## 2022-08-30 ENCOUNTER — Emergency Department: Payer: HMO

## 2022-08-30 DIAGNOSIS — J45909 Unspecified asthma, uncomplicated: Secondary | ICD-10-CM | POA: Diagnosis not present

## 2022-08-30 DIAGNOSIS — I10 Essential (primary) hypertension: Secondary | ICD-10-CM | POA: Insufficient documentation

## 2022-08-30 DIAGNOSIS — R221 Localized swelling, mass and lump, neck: Secondary | ICD-10-CM | POA: Diagnosis not present

## 2022-08-30 DIAGNOSIS — R000 Tachycardia, unspecified: Secondary | ICD-10-CM | POA: Diagnosis not present

## 2022-08-30 DIAGNOSIS — R21 Rash and other nonspecific skin eruption: Secondary | ICD-10-CM | POA: Diagnosis not present

## 2022-08-30 DIAGNOSIS — I2510 Atherosclerotic heart disease of native coronary artery without angina pectoris: Secondary | ICD-10-CM | POA: Insufficient documentation

## 2022-08-30 DIAGNOSIS — L03221 Cellulitis of neck: Secondary | ICD-10-CM | POA: Diagnosis not present

## 2022-08-30 LAB — BASIC METABOLIC PANEL
Anion gap: 10 (ref 5–15)
BUN: 29 mg/dL — ABNORMAL HIGH (ref 8–23)
CO2: 24 mmol/L (ref 22–32)
Calcium: 9.8 mg/dL (ref 8.9–10.3)
Chloride: 102 mmol/L (ref 98–111)
Creatinine, Ser: 0.64 mg/dL (ref 0.44–1.00)
GFR, Estimated: >60 mL/min (ref >60)
Glucose, Bld: 163 mg/dL — ABNORMAL HIGH (ref 70–99)
Potassium: 3.9 mmol/L (ref 3.5–5.1)
Sodium: 136 mmol/L (ref 135–145)

## 2022-08-30 LAB — CBC
HCT: 39.9 % (ref 36.0–46.0)
Hemoglobin: 12.7 g/dL (ref 12.0–15.0)
MCH: 26.5 pg (ref 26.0–34.0)
MCHC: 31.8 g/dL (ref 30.0–36.0)
MCV: 83.3 fL (ref 80.0–100.0)
Platelets: 343 10*3/uL (ref 150–400)
RBC: 4.79 MIL/uL (ref 3.87–5.11)
RDW: 15 % (ref 11.5–15.5)
WBC: 11.1 10*3/uL — ABNORMAL HIGH (ref 4.0–10.5)
nRBC: 0 % (ref 0.0–0.2)

## 2022-08-30 LAB — TROPONIN I (HIGH SENSITIVITY): Troponin I (High Sensitivity): 7 ng/L (ref <18)

## 2022-08-30 NOTE — ED Provider Notes (Signed)
[facility] Provider Note    Event Date/Time   First MD Initiated Contact with Patient 08/30/22 2333     (approximate)   History   Tachycardia   HPI  Kimberly Patton is a 67 y.o. female who presents to the ED for evaluation of Tachycardia   I review a cardiology clinic visit from February.  History of CAD by CT scan, single episode of A-fib in the past and paroxysmal SVT.  Diastolic dysfunction, HTN, obesity and asthma.  Normal EF. Another more recent long-term monitor and November with no evidence of A-fib, predominantly sinus rhythm with paroxysmal SVT.  10 days ago, patient just had an outpatient left thyroid lobectomy  She presents to the ED for elevation of redness to her neck.  She reports pruritic sensation developing over the past few days and spreading redness to her neck that is quite uncomfortable.  She reports being prescribed prednisone for this with no improvement.  She reports recurrence of tachycardia noted on outpatient monitor to the low 100s so she presents to the ED.  No fevers or systemic symptoms   Physical Exam   Triage Vital Signs: ED Triage Vitals  Enc Vitals Group     BP 08/30/22 2132 (!) 146/82     Pulse Rate 08/30/22 2132 (!) 103     Resp 08/30/22 2132 20     Temp 08/30/22 2132 98.3 F (36.8 C)     Temp Source 08/30/22 2132 Oral     SpO2 08/30/22 2132 99 %     Weight 08/30/22 2135 278 lb (126.1 kg)     Height 08/30/22 2135 5\' 6"  (1.676 m)     Head Circumference --      Peak Flow --      Pain Score 08/30/22 2135 6     Pain Loc --      Pain Edu? --      Excl. in [place]? --     Most recent vital signs: Vitals:   08/30/22 2132  BP: (!) 146/82  Pulse: (!) 103  Resp: 20  Temp: 98.3 F (36.8 C)  SpO2: 99%    General: Awake, no distress.  CV:  Good peripheral perfusion.  Resp:  Normal effort.  Abd:  No distention.  MSK:  No deformity noted.  Neuro:  No focal deficits appreciated. Other:  Flat erythema and  induration to the anterior neck surrounding a well-healing surgical incision without dehiscence or purulence.  No fluctuance.    ED Results / Procedures / Treatments   Labs (all labs ordered are listed, but only abnormal results are displayed) Labs Reviewed  BASIC METABOLIC PANEL - Abnormal; Notable for the following components:      Result Value   Glucose, Bld 163 (*)    BUN 29 (*)    All other components within normal limits  CBC - Abnormal; Notable for the following components:   WBC 11.1 (*)    All other components within normal limits  TROPONIN I (HIGH SENSITIVITY)  TROPONIN I (HIGH SENSITIVITY)    EKG Sinus rhythm at a rate of 95 bpm.  Normal axis.  Incomplete right bundle.  No clear signs of acute ischemia.  RADIOLOGY CXR interpreted by me without evidence of acute cardiopulmonary pathology.  Official radiology report(s): DG Chest Port 1 View  Result Date: 08/30/2022 CLINICAL DATA:  Itching and rash. EXAM: PORTABLE CHEST 1 VIEW COMPARISON:  March 24, 2022 FINDINGS: The heart size and mediastinal contours are within  normal limits. There is no evidence of an acute infiltrate, pleural effusion or pneumothorax. The visualized skeletal structures are unremarkable. IMPRESSION: No active disease. Electronically Signed   By: Virgina Norfolk M.D.   On: 08/30/2022 22:04    PROCEDURES and INTERVENTIONS:  .1-3 Lead EKG Interpretation  Performed by: Vladimir Crofts, MD Authorized by: Vladimir Crofts, MD     Interpretation: normal     ECG rate:  80   ECG rate assessment: normal     Rhythm: sinus rhythm     Ectopy: none     Conduction: normal     Medications - No data to display   IMPRESSION / MDM / [place] / ED COURSE  I reviewed the triage vital signs and the nursing notes.  Differential diagnosis includes, but is not limited to, cellulitis, abscess, wound dehiscence, hives, atopic dermatitis, sepsis  {Patient presents with symptoms of an acute illness or  injury that is potentially life-threatening.  67 year old woman about 10 days out from a left thyroid lobectomy presents with evidence of cellulitis around her surgical site suitable for trial of outpatient management with the initiation of antibiotics.  Presenting tachycardia is noted, but she remains stable.  Leukocytosis to 11.  Normal metabolic panel and troponins.  No dysrhythmias and clear CXR.  Contrasted CT scan with inflammatory changes consistent with cellulitis but no underlying abscess or other postoperative pathology.  I considered observation admission for this patient, but ultimately a trial of outpatient management is reasonable I believe considering how well she looks.  Discharged with Keflex  Clinical Course as of 08/31/22 0403  Fri Aug 31, 2022  0222 Reassessed.  Discussed CT results.  We discussed cellulitis.  We discussed expectant management and return precautions. [DS]    Clinical Course User Index [DS] Vladimir Crofts, MD     FINAL CLINICAL IMPRESSION(S) / ED DIAGNOSES   Final diagnoses:  None     Rx / DC Orders   ED Discharge Orders     None        Note:  This document was prepared using Dragon voice recognition software and may include unintentional dictation errors.   Vladimir Crofts, MD 08/31/22 [phone]

## 2022-08-30 NOTE — ED Triage Notes (Signed)
Pt from [facility] c/o fast heart rate from take prednisone d/t an allergic reaction after her thyroidectomy on 3/12, itching and rash. Pt started taper pack of prednisone tonight around 6pm, pt reports has had adverse reaction in past from prednisone induce Afib. Pt report felt her heart was racing and felt shaky at home, some chest pain and has a headache. HR 120s at home, pt took 30 mg diltiazem PTA. HR currently 103. VSS, pt A&O x4.

## 2022-08-31 ENCOUNTER — Emergency Department: Payer: HMO

## 2022-08-31 DIAGNOSIS — R221 Localized swelling, mass and lump, neck: Secondary | ICD-10-CM | POA: Diagnosis not present

## 2022-08-31 DIAGNOSIS — R000 Tachycardia, unspecified: Secondary | ICD-10-CM | POA: Diagnosis not present

## 2022-08-31 LAB — TROPONIN I (HIGH SENSITIVITY): Troponin I (High Sensitivity): 7 ng/L (ref <18)

## 2022-08-31 MED ORDER — CEPHALEXIN 500 MG PO CAPS
500.0000 mg | ORAL_CAPSULE | Freq: Once | ORAL | Status: AC
  Administered 2022-08-31: 500 mg via ORAL
  Filled 2022-08-31: qty 1

## 2022-08-31 MED ORDER — IOHEXOL 300 MG/ML  SOLN
75.0000 mL | Freq: Once | INTRAMUSCULAR | Status: AC | PRN
  Administered 2022-08-31: 75 mL via INTRAVENOUS

## 2022-08-31 MED ORDER — CEPHALEXIN 500 MG PO CAPS: 500.0000 mg | ORAL_CAPSULE | Freq: Four times a day (QID) | ORAL | 0 refills | Status: AC

## 2022-08-31 MED ORDER — KETOROLAC TROMETHAMINE 30 MG/ML IJ SOLN
15.0000 mg | Freq: Once | INTRAMUSCULAR | Status: AC
  Administered 2022-08-31: 15 mg via INTRAVENOUS
  Filled 2022-08-31: qty 1

## 2022-08-31 MED ORDER — DIPHENHYDRAMINE HCL 25 MG PO CAPS
50.0000 mg | ORAL_CAPSULE | Freq: Once | ORAL | Status: AC
  Administered 2022-08-31: 50 mg via ORAL
  Filled 2022-08-31: qty 2

## 2022-08-31 MED ORDER — DIPHENHYDRAMINE HCL 50 MG/ML IJ SOLN
25.0000 mg | Freq: Once | INTRAMUSCULAR | Status: AC
  Administered 2022-08-31: 25 mg via INTRAVENOUS
  Filled 2022-08-31: qty 1

## 2022-08-31 NOTE — ED Notes (Signed)
Notified provider: "Patient states that the Benadryl took the edge off but she is "still itching and miserable". Please advise ". Awaiting orders

## 2022-08-31 NOTE — Discharge Instructions (Signed)
Use Tylenol for pain and fevers.  Up to 1000 mg per dose, up to 4 times per day.  Do not take more than 4000 mg of Tylenol/acetaminophen within 24 hours..  Use naproxen/Aleve for anti-inflammatory pain relief. Use up to 500mg  every 12 hours. Do not take more frequently than this. Do not use other NSAIDs (ibuprofen, Advil) while taking this medication. It is safe to take Tylenol with this.   Use 25-50 mg of Benadryl per dose (1-2 tablets)  Please take the Keflex antibiotic at least 3 times per day, ideally 4 times per day to help treat cellulitis/skin infection.

## 2022-09-03 ENCOUNTER — Encounter: Payer: HMO | Admitting: Occupational Therapy

## 2022-09-05 ENCOUNTER — Encounter: Payer: Self-pay | Admitting: Internal Medicine

## 2022-09-06 ENCOUNTER — Encounter: Payer: HMO | Admitting: Occupational Therapy

## 2022-09-06 ENCOUNTER — Other Ambulatory Visit: Payer: Self-pay | Admitting: Pulmonary Disease

## 2022-09-07 ENCOUNTER — Encounter: Payer: HMO | Admitting: Occupational Therapy

## 2022-09-07 ENCOUNTER — Encounter: Payer: Self-pay | Admitting: Cardiology

## 2022-09-07 ENCOUNTER — Ambulatory Visit: Payer: HMO | Attending: Cardiology | Admitting: Cardiology

## 2022-09-07 ENCOUNTER — Telehealth: Payer: Self-pay | Admitting: Cardiology

## 2022-09-07 ENCOUNTER — Encounter: Payer: Self-pay | Admitting: Internal Medicine

## 2022-09-07 ENCOUNTER — Other Ambulatory Visit: Payer: Self-pay

## 2022-09-07 VITALS — BP 130/80 | HR 80 | Ht 66.0 in | Wt 279.0 lb

## 2022-09-07 DIAGNOSIS — I10 Essential (primary) hypertension: Secondary | ICD-10-CM

## 2022-09-07 DIAGNOSIS — I471 Supraventricular tachycardia, unspecified: Secondary | ICD-10-CM

## 2022-09-07 DIAGNOSIS — E7800 Pure hypercholesterolemia, unspecified: Secondary | ICD-10-CM | POA: Diagnosis not present

## 2022-09-07 MED ORDER — DILTIAZEM HCL ER 120 MG PO CP12: 120.0000 mg | ORAL_CAPSULE | Freq: Two times a day (BID) | ORAL | 0 refills | Status: DC

## 2022-09-07 MED ORDER — SEMAGLUTIDE-WEIGHT MANAGEMENT 0.25 MG/0.5ML ~~LOC~~ SOAJ
0.2500 mg | SUBCUTANEOUS | 0 refills | Status: DC
  Filled 2022-09-07: qty 2, 28d supply, fill #0

## 2022-09-07 MED ORDER — SEMAGLUTIDE-WEIGHT MANAGEMENT 2.4 MG/0.75ML ~~LOC~~ SOAJ
2.4000 mg | SUBCUTANEOUS | 3 refills | Status: DC
  Filled 2022-09-07: qty 3, fill #0

## 2022-09-07 MED ORDER — SEMAGLUTIDE-WEIGHT MANAGEMENT 1.7 MG/0.75ML ~~LOC~~ SOAJ
1.7000 mg | SUBCUTANEOUS | 0 refills | Status: DC
  Filled 2022-09-07: qty 3, 28d supply, fill #0

## 2022-09-07 MED ORDER — SEMAGLUTIDE-WEIGHT MANAGEMENT 0.5 MG/0.5ML ~~LOC~~ SOAJ
0.5000 mg | SUBCUTANEOUS | 0 refills | Status: DC
  Filled 2022-09-07: qty 2, 28d supply, fill #0

## 2022-09-07 MED ORDER — SEMAGLUTIDE-WEIGHT MANAGEMENT 1 MG/0.5ML ~~LOC~~ SOAJ
1.0000 mg | SUBCUTANEOUS | 0 refills | Status: DC
  Filled 2022-09-07: qty 2, 28d supply, fill #0

## 2022-09-07 NOTE — Telephone Encounter (Signed)
Call placed back to [employer]. The operator was unsure as to the phone call and was unable to help.   Call placed to the patient. The patient stated that a letter was sent previously that the other options for the Diltiazem 120 mg bid that are Tier 2:  Per Kimberly Patton with [employer], patient requested that she call to off alternate options as Diltiazem is a tier 4 drug. She named of several tier 2 options: -Diltiazem ER 120 MG 24hr -Dilt ER 180 MG -Cartia XT 120 MG, 180 MG, 240 MG, or 300 MG

## 2022-09-07 NOTE — Telephone Encounter (Signed)
Pt c/o medication issue:  1. Name of Medication:  Diltiazem tier 4  2. How are you currently taking this medication (dosage and times per day)?   3. Are you having a reaction (difficulty breathing--STAT)?   4. What is your medication issue?   Tier 2 er 120 mg 24 hr Dilt er 180  Cartia xt 120 or 180 240 300 mg  Pa # [phone] (opt#: 2)   Per Chrys Racer with [employer], patient requested that she call to off alternate options as Diltiazem is a tier 4 drug. She named of several tier 2 options: -Diltiazem ER 120 MG 24hr -Dilt ER 180 MG -Cartia XT 120 MG, 180 MG, 240 MG, or 300 MG  Please return call to discuss options at [phone] (option#:2)

## 2022-09-07 NOTE — Progress Notes (Signed)
Cardiology Office Note:    Date:  09/07/2022   ID:  Kimberly Patton, DOB 1956-06-06, MRN [id number]  PCP:  Kimberly Sanders, MD  [facility] Cardiologist:  Kimberly Sable, MD  [facility] Electrophysiologist:  None   Referring MD: Kimberly Sanders, MD   Chief Complaint  Patient presents with   Follow-up    8 week f/u, low pulse in evenings, medication questions     History of Present Illness:    Kimberly Patton is a 67 y.o. female with a hx of hypertension, hiatal hernia, GERD, iron deficiency anemia, SVT on diltiazem presenting for follow-up.    Had some cost issues with Cardizem.  This was transitioned to verapamil.  Blood pressures have been elevated.  Has been taking as needed Cardizem for BP control.  She states diltiazem SR 12-hour is covered by her insurance, with 0 co-pay.  She has been on this before, otherwise she has no concerns at this time.    Recently had a thyroid resection, complicated with infection.  Much improved with antibiotics.  Her heart rate sometimes go low in the 40s-50s during sleep.  Prior notes Cardiac monitor 04/2022 no A-fib, occasional paroxysmal SVT Echocardiogram 01/2020 showed normal systolic and diastolic function, EF 60 to 65%.  Lexiscan Myoview 01/2020 with no evidence for ischemia.  History of lone atrial fibrillation in the context of steroid administration. Has a history of hypokalemia while taking HCTZ. Losartan was started after stopping HCTZ. Developed nausea with losartan. This was stopped.   Past Medical History:  Diagnosis Date   ALLERGIC RHINITIS 04/03/2007   Allergy    Not sure   Anemia    ((Pt Qnr Sub: Denies at visit from 08/28/2021 with endocrinology))    Anginal pain ([place])    Anxiety    Arthritis Not sure   Asthma    hx of years ago - no longer a problem    ASTHMA, PERSISTENT, MODERATE 04/03/2007   Cancer ([place])    skin    Dyspnea    with exertion   Dyspnea on exertion    a. 11/2007 Echo: EF 60%.   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 11/2011 48h Holter: RSR, rare PVC's, occas PAC's   Retinal tear of right eye 01/2016   Thyroid nodule 04/15/2018   Ventral hernia     Past Surgical History:  Procedure Laterality Date   [place]   ARTERY BIOPSY Left 03/21/2022   Procedure: BIOPSY TEMPORAL ARTERY;  Surgeon: Kimberly Cabal, MD;  Location: [facility] OR[facility];  Service: Vascular;  Laterality: Left;   BIOPSY THYROID     08/2021   BREAST CYST EXCISION Right 1994   Benign   CESAREAN SECTION     x2   CHOLECYSTECTOMY  1995   COLONOSCOPY WITH PROPOFOL N/A 05/09/2021   Procedure: COLONOSCOPY WITH PROPOFOL;  Surgeon: [name], MD;  Location:  [facility];  Service: Gastroenterology;  Laterality: N/A;   EYE SURGERY  01/2016   Repair retinal tear   FOOT SURGERY  1995 / 1996   x2   HERNIA REPAIR     KNEE ARTHROSCOPY W/ MENISCAL REPAIR  07/25/2013   right knee Dr. Mardelle Matte   LAPAROSCOPIC GASTRIC SLEEVE RESECTION N/A 06/08/2013   Procedure: LAPAROSCOPIC GASTRIC SLEEVE RESECTION AND EXCISION OF SEBACEUS CYST FROM MID CHEST takedown of incarcerated ventral hernia and primary repair endoscopy;  Surgeon: Kimberly Earls, MD;  Location: [facility];  Service: General;  Laterality: N/A;   LAPAROSCOPIC NISSEN FUNDOPLICATION N/A [id number]   Procedure: LAPAROSCOPIC REPAIR LARGE SYMPTOMATIC HIATAL HERNIA WITH UPPER ENDOSCOPY;  Surgeon: Johnathan Hausen, MD;  Location: [facility];  Service: General;  Laterality: N/A;   moles  06/2013   removed 2 moles from under  arm and  lowback   NECK SURGERY     occipital nerve damage- injections    OVARIAN CYST [place] EXCISIONAL BREAST BIOPSY Right 06/23/2021   Procedure: RADIOACTIVE SEED GUIDED EXCISIONAL RIGHT BREAST BIOPSY;  Surgeon: Johnathan Hausen, MD;  Location: [place];  Service: General;  Laterality: Right;   RETINAL TEAR REPAIR CRYOTHERAPY Right 01/2016   [place]   SKIN CANCER EXCISION     THYROID LOBECTOMY Left 08/21/2022   Procedure: LEFT THYROID LOBECTOMY;  Surgeon: Kimberly Gemma, MD;  Location: [facility];  Service: General;  Laterality: Left;   THYROIDECTOMY, PARTIAL     TONSILLECTOMY     TUBAL LIGATION  Think 1994   UPPER GI ENDOSCOPY  06/08/2013   Procedure: UPPER GI ENDOSCOPY;  Surgeon: Kimberly Earls, MD;  Location: [facility];  Service: General;;   US ECHOCARDIOGRAPHY  12/2011   WNL - EF 55-60%, mild MR, grade 1 diastolic dysnfiction (mild)   VENTRAL HERNIA REPAIR  2000    Current Medications: Current Meds  Medication Sig   albuterol (VENTOLIN HFA) 108 (90 Base) MCG/ACT inhaler Inhale 2 puffs into the lungs every 6 (six) hours as needed for wheezing or shortness of breath.   Calcium Citrate-Vitamin D (CITRACAL + D PO) Take 2 tablets by mouth daily.    CARDIZEM 30 MG tablet Take 1 tablet (30 mg total) by mouth daily as needed (for elevated Heart rate).   cephALEXin (KEFLEX) 500 MG capsule Take 1 capsule (500 mg total) by mouth 2 (two) times daily.   cephALEXin (KEFLEX) 500 MG capsule Take 1 capsule (500 mg total) by mouth 4 (four) times daily for 7 days.   Cholecalciferol (VITAMIN D3) 1.25 MG (50000 UT) capsule Take 1 capsule (50,000 Units total) by mouth once a week. Tuesday   Coenzyme Q10-Vitamin E (QUNOL ULTRA COQ10) 100-150 MG-UNIT CAPS Take 100 mg by mouth daily.   Cyanocobalamin (VITAMIN B-12) 2500 MCG SUBL Place 2,500 mcg under the tongue daily.   diltiazem (CARDIZEM SR) 120 MG 12 hr capsule Take 1 capsule  (120 mg total) by mouth 2 (two) times daily.   famotidine (PEPCID) 40 MG tablet Take 40 mg by mouth at bedtime.   fluticasone-salmeterol (WIXELA INHUB) 250-50 MCG/ACT AEPB Inhale 1 puff into the lungs in the morning and at bedtime. (Patient taking differently: Inhale 1 puff into the lungs daily.)   gabapentin (NEURONTIN) 100 MG capsule Take 100 mg by mouth daily.   Lactobacillus Rhamnosus, GG, (CULTURELLE IMMUNITY SUPPORT PO) Take 1 capsule by mouth daily.   Menaquinone-7 (VITAMIN K2 PO)  Take 100 mg by mouth daily.   methocarbamol (ROBAXIN) 500 MG tablet Take 500 mg by mouth at bedtime as needed for muscle spasms.   montelukast (SINGULAIR) 10 MG tablet TAKE 1 TABLET BY MOUTH EVERYDAY AT BEDTIME   Multiple Vitamins-Minerals (CENTRUM SILVER 50+WOMEN PO) Take 1 tablet by mouth daily.   nitroGLYCERIN (NITROSTAT) 0.4 MG SL tablet Place 1 tablet (0.4 mg total) under the tongue every 5 (five) minutes as needed for chest pain.   Omega-3 Fatty Acids (FISH OIL) 1200 MG CAPS Take 1,200 mg by mouth daily.   pantoprazole (PROTONIX) 40 MG tablet TAKE 1 TABLET BY MOUTH EVERY DAY (Patient taking differently: Take 40 mg by mouth daily.)   Riboflavin 400 MG CAPS Take 400 mg by mouth daily.   rosuvastatin (CRESTOR) 10 MG tablet Take 1 tablet (10 mg total) by mouth daily. (Patient taking differently: Take 10 mg by mouth at bedtime.)   Semaglutide-Weight Management 0.25 MG/0.5ML SOAJ Inject 0.25 mg into the skin once a week for 28 days.   [START ON 10/06/2022] Semaglutide-Weight Management 0.5 MG/0.5ML SOAJ Inject 0.5 mg into the skin once a week for 28 days.   [START ON 11/04/2022] Semaglutide-Weight Management 1 MG/0.5ML SOAJ Inject 1 mg into the skin once a week for 28 days.   [START ON 12/03/2022] Semaglutide-Weight Management 1.7 MG/0.75ML SOAJ Inject 1.7 mg into the skin once a week for 28 days.   [START ON 01/01/2023] Semaglutide-Weight Management 2.4 MG/0.75ML SOAJ Inject 2.4 mg into the skin once a week.    spironolactone (ALDACTONE) 25 MG tablet Take 1 tablet (25 mg total) by mouth daily.   traMADol (ULTRAM) 50 MG tablet Take 1 tablet (50 mg total) by mouth every 6 (six) hours as needed.   TURMERIC PO Take 2 tablets by mouth daily. 500 mg  With Ginger 50 mg Gummies   Zoledronic Acid (RECLAST IV) Inject into the vein. Yearly injection   [DISCONTINUED] verapamil (CALAN-SR) 120 MG CR tablet Take 1 tablet (120 mg total) by mouth in the morning, at noon, and at bedtime.     Allergies:   Wasp venom, Chlorhexidine gluconate, Metronidazole, Prednisone, and Sodium hypochlorite   Social History   Socioeconomic History   Marital status: Divorced    Spouse name: Not on file   Number of children: 2   Years of education: 17   Highest education level: Not on file  Occupational History   Occupation: Product manager: [employer]  Tobacco Use   Smoking status: Never   Smokeless tobacco: Never   Tobacco comments:    smoke at age 9-12  Vaping Use   Vaping Use: Never used  Substance and Sexual Activity   Alcohol use: Not Currently   Drug use: No   Sexual activity: Not Currently    Partners: Male    Birth control/protection: Abstinence, Pill  Other Topics Concern   Not on file  Social History Narrative   Lives at home alone.   Right-handed.   Occasional caffeine use.   Social Determinants of Health   Financial Resource Strain: Low Risk  (05/08/2022)   Overall Financial Resource Strain (CARDIA)    Difficulty of Paying Living Expenses: Not hard at all  Food Insecurity: No Food Insecurity (08/21/2022)   Hunger Vital Sign    Worried About Running Out of Food in the Last Year: Never true    Ran Out of Food in the Last Year: Never true  Transportation Needs: No Transportation Needs (08/21/2022)  PRAPARE - Hydrologist (Medical): No    Lack of Transportation (Non-Medical): No  Physical Activity: Inactive (05/08/2022)   Exercise Vital Sign    Days  of Exercise per Week: 0 days    Minutes of Exercise per Session: 0 min  Stress: No Stress Concern Present (05/08/2022)   [place]    Feeling of Stress : Not at all  Social Connections: Moderately Integrated (05/08/2022)   Social Connection and Isolation Panel [NHANES]    Frequency of Communication with Friends and Family: More than three times a week    Frequency of Social Gatherings with Friends and Family: More than three times a week    Attends Religious Services: More than 4 times per year    Active Member of [employer] or Organizations: Yes    Attends Music therapist: More than 4 times per year    Marital Status: Divorced     Family History: The patient's family history includes Breast cancer in her mother; Diabetes in her father, mother, and another family member; Hemophilia in an other family member; Hypertension in her father, mother, and another family member; Kidney failure in her mother; Stroke in an other family member.  ROS:   Please see the history of present illness.     All other systems reviewed and are negative.  EKGs/Labs/Other Studies Reviewed:    The following studies were reviewed today:   EKG:  EKG not  ordered today.  Recent Labs: 08/10/2022: ALT 12; TSH 1.90 08/17/2022: Magnesium 2.0 08/30/2022: BUN 29; Creatinine, Ser 0.64; Hemoglobin 12.7; Platelets 343; Potassium 3.9; Sodium 136  Recent Lipid Panel    Component Value Date/Time   CHOL 138 07/11/2022 0659   CHOL 168 07/31/2021 0905   TRIG 82 07/11/2022 0659   HDL 61 07/11/2022 0659   HDL 67 07/31/2021 0905   CHOLHDL 2.3 07/11/2022 0659   VLDL 16 07/11/2022 0659   LDLCALC 61 07/11/2022 0659   LDLCALC 85 07/31/2021 0905    Physical Exam:    VS:  BP 130/80 (BP Location: Left Arm, Patient Position: Sitting, Cuff Size: Large)   Pulse 80   Ht 5\' 6"  (1.676 m)   Wt 279 lb (126.6 kg)   SpO2 99%   BMI 45.03 kg/m     Wt  Readings from Last 3 Encounters:  09/07/22 279 lb (126.6 kg)  08/30/22 278 lb (126.1 kg)  08/21/22 281 lb 3.2 oz (127.6 kg)     GEN:  Well nourished, well developed in no acute distress, obese HEENT: Normal NECK: No JVD; No carotid bruits CARDIAC: RRR, no murmurs, rubs, gallops RESPIRATORY:  Clear to auscultation without rales, wheezing or rhonchi  ABDOMEN: Soft, non-tender, non-distended MUSCULOSKELETAL: Lower extremity edema/ lymphedema noted SKIN: Warm and dry NEUROLOGIC:  Alert and oriented x 3 PSYCHIATRIC:  Normal affect   ASSESSMENT:    1. Primary hypertension   2. Paroxysmal SVT (supraventricular tachycardia)   3. Pure hypercholesterolemia   4. Morbid obesity ([place])    PLAN:    In order of problems listed above:   hypertension.  BP controlled.  Start Cardizem SR 120 mg twice daily, Aldactone.. paroxysmal SVT, symptoms controlled, Cardizem SR 120 mg twice daily. Hyperlipidemia, continue Crestor 10 mg daily.  Previously declined titrating medication. Morbid obesity, low-calorie diet, weight loss advised..  Follow-up in 3-6 months   Total encounter time more than 40 minutes  Greater than 50% was spent in  counseling and coordination of care with the patient   This note was generated in part or whole with voice recognition software. Voice recognition is usually quite accurate but there are transcription errors that can and very often do occur. I apologize for any typographical errors that were not detected and corrected.  Medication Adjustments/Labs and Tests Ordered: Current medicines are reviewed at length with the patient today.  Concerns regarding medicines are outlined above.  No orders of the defined types were placed in this encounter.   Meds ordered this encounter  Medications   diltiazem (CARDIZEM SR) 120 MG 12 hr capsule    Sig: Take 1 capsule (120 mg total) by mouth 2 (two) times daily.    Dispense:  180 capsule    Refill:  0   Semaglutide-Weight  Management 0.25 MG/0.5ML SOAJ    Sig: Inject 0.25 mg into the skin once a week for 28 days.    Dispense:  2 mL    Refill:  0   Semaglutide-Weight Management 0.5 MG/0.5ML SOAJ    Sig: Inject 0.5 mg into the skin once a week for 28 days.    Dispense:  2 mL    Refill:  0   Semaglutide-Weight Management 1 MG/0.5ML SOAJ    Sig: Inject 1 mg into the skin once a week for 28 days.    Dispense:  2 mL    Refill:  0   Semaglutide-Weight Management 1.7 MG/0.75ML SOAJ    Sig: Inject 1.7 mg into the skin once a week for 28 days.    Dispense:  3 mL    Refill:  0   Semaglutide-Weight Management 2.4 MG/0.75ML SOAJ    Sig: Inject 2.4 mg into the skin once a week.    Dispense:  3 mL    Refill:  3     Patient Instructions  Medication Instructions:   START diltiazem (CARDIZEM SR) 120 MG 12 hr capsule  - take one capsule (120 mg) by mouth twice a day.  *If you need a refill on your cardiac medications before your next appointment, please call your pharmacy*   Lab Work:  None Ordered  If you have labs (blood work) drawn today and your tests are completely normal, you will receive your results only by: [place] (if you have MyChart) OR A paper copy in the mail If you have any lab test that is abnormal or we need to change your treatment, we will call you to review the results.   Testing/Procedures:  None Ordered    Follow-Up: At [facility], you and your health needs are our priority.  As part of our continuing mission to provide you with exceptional heart care, we have created designated Provider Care Teams.  These Care Teams include your primary Cardiologist (physician) and Advanced Practice Providers (APPs -  Physician Assistants and Nurse Practitioners) who all work together to provide you with the care you need, when you need it.  We recommend signing up for the patient portal called "MyChart".  Sign up information is provided on this After Visit Summary.  MyChart is  used to connect with patients for Virtual Visits (Telemedicine).  Patients are able to view lab/test results, encounter notes, upcoming appointments, etc.  Non-urgent messages can be sent to your provider as well.   To learn more about what you can do with MyChart, go to [email or web address].    Your next appointment:   3 month(s)  Provider:   You may see Aaron Edelman  Agbor-Etang, MD or one of the following Advanced Practice Providers on your designated Care Team:   Murray Hodgkins, NP Christell Faith, PA-C Cadence Kathlen Mody, PA-C Gerrie Nordmann, NP   Signed, Kimberly Sable, MD  09/07/2022 11:11 AM    [place]

## 2022-09-07 NOTE — Patient Instructions (Signed)
Medication Instructions:   START diltiazem (CARDIZEM SR) 120 MG 12 hr capsule  - take one capsule (120 mg) by mouth twice a day.  *If you need a refill on your cardiac medications before your next appointment, please call your pharmacy*   Lab Work:  None Ordered  If you have labs (blood work) drawn today and your tests are completely normal, you will receive your results only by: [place] (if you have MyChart) OR A paper copy in the mail If you have any lab test that is abnormal or we need to change your treatment, we will call you to review the results.   Testing/Procedures:  None Ordered    Follow-Up: At [facility], you and your health needs are our priority.  As part of our continuing mission to provide you with exceptional heart care, we have created designated Provider Care Teams.  These Care Teams include your primary Cardiologist (physician) and Advanced Practice Providers (APPs -  Physician Assistants and Nurse Practitioners) who all work together to provide you with the care you need, when you need it.  We recommend signing up for the patient portal called "MyChart".  Sign up information is provided on this After Visit Summary.  MyChart is used to connect with patients for Virtual Visits (Telemedicine).  Patients are able to view lab/test results, encounter notes, upcoming appointments, etc.  Non-urgent messages can be sent to your provider as well.   To learn more about what you can do with MyChart, go to [email or web address].    Your next appointment:   3 month(s)  Provider:   You may see [name], MD or one of the following Advanced Practice Providers on your designated Care Team:   Murray Hodgkins, NP Christell Faith, PA-C Cadence Kathlen Mody, PA-C Gerrie Nordmann, NP

## 2022-09-10 ENCOUNTER — Encounter: Payer: HMO | Admitting: Occupational Therapy

## 2022-09-10 MED ORDER — DILTIAZEM HCL ER COATED BEADS 120 MG PO CP24: 120.0000 mg | ORAL_CAPSULE | Freq: Two times a day (BID) | ORAL | 0 refills | Status: DC

## 2022-09-10 NOTE — Telephone Encounter (Signed)
Patient has been made aware. She will keep the office updated on blood pressures and heart rates.   [name], MD  You; Bubba Hales, Kennis Carina, [place] hours ago (2:05 PM)    Prescribe Diltiazem ER 120 MG 24hr 1 tablet twice daily.

## 2022-09-12 ENCOUNTER — Encounter: Payer: HMO | Admitting: Occupational Therapy

## 2022-09-13 ENCOUNTER — Ambulatory Visit: Payer: HMO | Admitting: Cardiology

## 2022-09-17 ENCOUNTER — Encounter: Payer: Self-pay | Admitting: Cardiology

## 2022-09-17 ENCOUNTER — Encounter: Payer: HMO | Admitting: Occupational Therapy

## 2022-09-19 ENCOUNTER — Encounter: Payer: HMO | Admitting: Occupational Therapy

## 2022-09-20 DIAGNOSIS — Z9009 Acquired absence of other part of head and neck: Secondary | ICD-10-CM | POA: Insufficient documentation

## 2022-09-24 ENCOUNTER — Encounter: Payer: HMO | Admitting: Occupational Therapy

## 2022-09-24 ENCOUNTER — Encounter: Payer: Self-pay | Admitting: Medical

## 2022-09-24 ENCOUNTER — Ambulatory Visit: Payer: HMO | Attending: Medical | Admitting: Medical

## 2022-09-24 VITALS — BP 143/82 | HR 68 | Ht 66.0 in | Wt 277.0 lb

## 2022-09-24 DIAGNOSIS — I10 Essential (primary) hypertension: Secondary | ICD-10-CM | POA: Diagnosis not present

## 2022-09-24 DIAGNOSIS — I471 Supraventricular tachycardia, unspecified: Secondary | ICD-10-CM

## 2022-09-24 DIAGNOSIS — R079 Chest pain, unspecified: Secondary | ICD-10-CM | POA: Diagnosis not present

## 2022-09-24 DIAGNOSIS — R198 Other specified symptoms and signs involving the digestive system and abdomen: Secondary | ICD-10-CM

## 2022-09-24 DIAGNOSIS — Z9009 Acquired absence of other part of head and neck: Secondary | ICD-10-CM | POA: Diagnosis not present

## 2022-09-24 DIAGNOSIS — E782 Mixed hyperlipidemia: Secondary | ICD-10-CM

## 2022-09-24 MED ORDER — VERAPAMIL HCL ER 120 MG PO TBCR: 120.0000 mg | EXTENDED_RELEASE_TABLET | Freq: Two times a day (BID) | ORAL | 3 refills | Status: DC

## 2022-09-24 NOTE — Progress Notes (Signed)
Cardiology Office Note:    Date:  09/24/2022   ID:  Kimberly Patton, DOB 06-Sep-1955, MRN [id number]  PCP:  Excell Seltzer, MD  [facility] Cardiologist:  Debbe Odea, MD  [facility] Electrophysiologist:  None   Referring MD: Excell Seltzer, MD   Chief Complaint: 1 month follow-up  History of Present Illness:    Kimberly Patton is a 67 y.o. female with a hx of hypertension, hiatal hernia, GERD, iron deficiency anemia, SVT on diltiazem presenting for 1 month follow-up.  Patient has a history of lone atrial fibrillation in the context of steroid administration.  Patient has a history of hypokalemia while taking HCTZ.  Losartan was started after stopping HCTZ, however she developed nausea with losartan and this was stopped.  Lexiscan in August 2021 showed no evidence of ischemia.  Echocardiogram in August 2021 showed normal systolic and diastolic function with an EF of 60 to 65%.  Heart monitor November 2023 showed no A-fib with occasional PSVT.  Patient was last seen March 2024 and Cardizem was started for blood pressure control.  Patient called in reporting negative side effects from Cardizem.  Today, the patient reports she was previously on Cardizem but insurance increased the cost. She was switched to Verapamil. Insurance changed Cardizem price and the was restarted. When she is taking Cardizem she reports strange body feeling. Chest pain and has a bowel movement. She gets nauseous and chills. She gets shoulder pains. She had thyroid surgery on March 12th and was on antibiotics for cellulitis.   Chest pain in the upper chest. Feels something is at her throat. She was on Verapamil 120 BID. Unsure if chest pain is GERD.   Past Medical History:  Diagnosis Date   ALLERGIC RHINITIS 04/03/2007   Allergy    Not sure   Anemia    ((Pt Qnr Sub: Denies at visit from 08/28/2021 with endocrinology))    Anginal pain    Anxiety    Arthritis Not sure   Asthma    hx of years ago -  no longer a problem    ASTHMA, PERSISTENT, MODERATE 04/03/2007   Cancer    skin    Dyspnea    with exertion   Dyspnea on exertion    a. 11/2007 Echo: EF 60%.   Dysrhythmia    hx of heart arrhythmia 10-15 years ago - followed by DR Ladona Ridgel - not seen in years    GERD (gastroesophageal reflux disease)    Headache    Hemophilia carrier    History of kidney stones    Hypercholesteremia 06/09/2019   Hypertension    Joint pain    HIPS / LEGS   Knee injury    RT   Meningioma    Morbid obesity    Neuromuscular disorder Not sure, since my fall   Paroxysmal SVT (supraventricular tachycardia)    a. 11/2011 48h Holter: RSR, rare PVC's, occas PAC's   Retinal tear of right eye 01/2016   Thyroid nodule 04/15/2018   Ventral hernia     Past Surgical History:  Procedure Laterality Date   ABDOMINAL HYSTERECTOMY  Think 1994   APPENDECTOMY  1996   ARTERY BIOPSY Left 03/21/2022   Procedure: BIOPSY TEMPORAL ARTERY;  Surgeon: Renford Dills, MD;  Location: [facility] ORS;  Service: Vascular;  Laterality: Left;   BIOPSY THYROID     08/2021   BREAST CYST EXCISION Right 1994   Benign   CESAREAN SECTION     x2  CHOLECYSTECTOMY  1995   COLONOSCOPY WITH PROPOFOL N/A 05/09/2021   Procedure: COLONOSCOPY WITH PROPOFOL;  Surgeon: Wyline Mood, MD;  Location: [facility] ENDOSCOPY;  Service: Gastroenterology;  Laterality: N/A;   EYE SURGERY  01/2016   Repair retinal tear   FOOT SURGERY  1995 / 1996   x2   HERNIA REPAIR     KNEE ARTHROSCOPY W/ MENISCAL REPAIR  07/25/2013   right knee Dr. Dion Saucier   LAPAROSCOPIC GASTRIC SLEEVE RESECTION N/A 06/08/2013   Procedure: LAPAROSCOPIC GASTRIC SLEEVE RESECTION AND EXCISION OF SEBACEUS CYST FROM MID CHEST takedown of incarcerated ventral hernia and primary repair endoscopy;  Surgeon: Valarie Merino, MD;  Location: [facility] ORS;  Service: General;  Laterality: N/A;   LAPAROSCOPIC NISSEN FUNDOPLICATION N/A 02/02/2020   Procedure: LAPAROSCOPIC REPAIR LARGE SYMPTOMATIC HIATAL  HERNIA WITH UPPER ENDOSCOPY;  Surgeon: Luretha Murphy, MD;  Location: [facility] ORS;  Service: General;  Laterality: N/A;   moles  06/2013   removed 2 moles from under arm and  lowback   NECK SURGERY     occipital nerve damage- injections    OVARIAN CYST REMOVAL  1970   PILONIDAL CYST EXCISION  1975   RADIOACTIVE SEED GUIDED EXCISIONAL BREAST BIOPSY Right 06/23/2021   Procedure: RADIOACTIVE SEED GUIDED EXCISIONAL RIGHT BREAST BIOPSY;  Surgeon: Luretha Murphy, MD;  Location: [facility];  Service: General;  Laterality: Right;   RETINAL TEAR REPAIR CRYOTHERAPY Right 01/2016   [place]   SKIN CANCER EXCISION     THYROID LOBECTOMY Left 08/21/2022   Procedure: LEFT THYROID LOBECTOMY;  Surgeon: Darnell Level, MD;  Location: [facility];  Service: General;  Laterality: Left;   THYROIDECTOMY, PARTIAL     TONSILLECTOMY     TUBAL LIGATION  Think 1994   UPPER GI ENDOSCOPY  06/08/2013   Procedure: UPPER GI ENDOSCOPY;  Surgeon: Valarie Merino, MD;  Location: [facility];  Service: General;;   US ECHOCARDIOGRAPHY  12/2011   WNL - EF 55-60%, mild MR, grade 1 diastolic dysnfiction (mild)   VENTRAL HERNIA REPAIR  2000    Current Medications: Current Meds  Medication Sig   albuterol (VENTOLIN HFA) 108 (90 Base) MCG/ACT inhaler Inhale 2 puffs into the lungs every 6 (six) hours as needed for wheezing or shortness of breath.   Calcium Citrate-Vitamin D (CITRACAL + D PO) Take 2 tablets by mouth daily.    Cholecalciferol (VITAMIN D3) 1.25 MG (50000 UT) capsule Take 1 capsule (50,000 Units total) by mouth once a week. Tuesday   Coenzyme Q10-Vitamin E (QUNOL ULTRA COQ10) 100-150 MG-UNIT CAPS Take 100 mg by mouth daily.   Cyanocobalamin (VITAMIN B-12) 2500 MCG SUBL Place 2,500 mcg under the tongue daily.   diltiazem (CARDIZEM CD) 120 MG 24 hr capsule Take 1 capsule (120 mg total) by mouth 2 (two) times daily.   famotidine (PEPCID) 40 MG tablet Take 40 mg by mouth at bedtime.   fluticasone-salmeterol (WIXELA  INHUB) 250-50 MCG/ACT AEPB Inhale 1 puff into the lungs in the morning and at bedtime. (Patient taking differently: Inhale 1 puff into the lungs daily.)   gabapentin (NEURONTIN) 100 MG capsule Take 100 mg by mouth daily.   Lactobacillus Rhamnosus, GG, (CULTURELLE IMMUNITY SUPPORT PO) Take 1 capsule by mouth daily.   Menaquinone-7 (VITAMIN K2 PO) Take 100 mg by mouth daily.   methocarbamol (ROBAXIN) 500 MG tablet Take 500 mg by mouth at bedtime as needed for muscle spasms.   montelukast (SINGULAIR) 10 MG tablet TAKE 1 TABLET BY MOUTH EVERYDAY AT  BEDTIME   Multiple Vitamins-Minerals (CENTRUM SILVER 50+WOMEN PO) Take 1 tablet by mouth daily.   nitroGLYCERIN (NITROSTAT) 0.4 MG SL tablet Place 1 tablet (0.4 mg total) under the tongue every 5 (five) minutes as needed for chest pain.   Omega-3 Fatty Acids (FISH OIL) 1200 MG CAPS Take 1,200 mg by mouth daily.   pantoprazole (PROTONIX) 40 MG tablet TAKE 1 TABLET BY MOUTH EVERY DAY (Patient taking differently: Take 40 mg by mouth daily.)   Riboflavin 400 MG CAPS Take 400 mg by mouth daily.   rosuvastatin (CRESTOR) 10 MG tablet Take 1 tablet (10 mg total) by mouth daily. (Patient taking differently: Take 10 mg by mouth at bedtime.)   spironolactone (ALDACTONE) 25 MG tablet Take 1 tablet (25 mg total) by mouth daily.   traMADol (ULTRAM) 50 MG tablet Take 1 tablet (50 mg total) by mouth every 6 (six) hours as needed.   TURMERIC PO Take 2 tablets by mouth daily. 500 mg  With Ginger 50 mg Gummies   verapamil (CALAN-SR) 120 MG CR tablet Take 1 tablet (120 mg total) by mouth 2 (two) times daily.   Zoledronic Acid (RECLAST IV) Inject into the vein. Yearly injection   [DISCONTINUED] CARDIZEM 30 MG tablet Take 1 tablet (30 mg total) by mouth daily as needed (for elevated Heart rate).     Allergies:   Wasp venom, Chlorhexidine gluconate, Metronidazole, Prednisone, and Sodium hypochlorite   Social History   Socioeconomic History   Marital status: Divorced     Spouse name: Not on file   Number of children: 2   Years of education: 17   Highest education level: Not on file  Occupational History   Occupation: Magazine features editor: [employer]  Tobacco Use   Smoking status: Never   Smokeless tobacco: Never   Tobacco comments:    smoke at age [age]  Vaping Use   Vaping Use: Never used  Substance and Sexual Activity   Alcohol use: Not Currently   Drug use: No   Sexual activity: Not Currently    Partners: Male    Birth control/protection: Abstinence, Pill  Other Topics Concern   Not on file  Social History Narrative   Lives at home alone.   Right-handed.   Occasional caffeine use.   Social Determinants of Health   Financial Resource Strain: Low Risk  (05/08/2022)   Overall Financial Resource Strain (CARDIA)    Difficulty of Paying Living Expenses: Not hard at all  Food Insecurity: No Food Insecurity (08/21/2022)   Hunger Vital Sign    Worried About Running Out of Food in the Last Year: Never true    Ran Out of Food in the Last Year: Never true  Transportation Needs: No Transportation Needs (08/21/2022)   PRAPARE - Administrator, Civil Service (Medical): No    Lack of Transportation (Non-Medical): No  Physical Activity: Inactive (05/08/2022)   Exercise Vital Sign    Days of Exercise per Week: 0 days    Minutes of Exercise per Session: 0 min  Stress: No Stress Concern Present (05/08/2022)   [employer] of Occupational Health - Occupational Stress Questionnaire    Feeling of Stress : Not at all  Social Connections: Moderately Integrated (05/08/2022)   Social Connection and Isolation Panel [NHANES]    Frequency of Communication with Friends and Family: More than three times a week    Frequency of Social Gatherings with Friends and Family: More than three times a week  Attends Religious Services: More than 4 times per year    Active Member of Clubs or Organizations: Yes    Attends Hospital doctor: More than 4 times per year    Marital Status: Divorced     Family History: The patient's family history includes Breast cancer in her mother; Diabetes in her father, mother, and another family member; Hemophilia in an other family member; Hypertension in her father, mother, and another family member; Kidney failure in her mother; Stroke in an other family member.  ROS:   Please see the history of present illness.     All other systems reviewed and are negative.  EKGs/Labs/Other Studies Reviewed:    The following studies were reviewed today:  Echo 09/2021 1. Left ventricular ejection fraction, by estimation, is 55 to 60%. The  left ventricle has normal function. The left ventricle has no regional  wall motion abnormalities. Left ventricular diastolic parameters were  normal. The average left ventricular  global longitudinal strain is -20.5 %. The global longitudinal strain is  normal.   2. Right ventricular systolic function is normal. The right ventricular  size is normal.   3. The mitral valve is normal in structure. Trivial mitral valve  regurgitation.   4. The aortic valve is tricuspid. Aortic valve regurgitation is not  visualized.   5. The inferior vena cava is normal in size with greater than 50%  respiratory variability, suggesting right atrial pressure of 3 mmHg.   Heart monitor 04/2022 Patch Wear Time:  12 days and 22 hours (2023-10-24T08:08:41-0400 to 2023-11-06T05:44:58-0500)   Patient had a min HR of 44 bpm, max HR of 197 bpm, and avg HR of 74 bpm. Predominant underlying rhythm was Sinus Rhythm. 13 Supraventricular Tachycardia runs occurred, the run with the fastest interval lasting 5 beats with a max rate of 197 bpm, the  longest lasting 14 beats with an avg rate of 155 bpm. Some episodes of Supraventricular Tachycardia may be possible Atrial Tachycardia with variable block. Isolated SVEs were rare (<1.0%), SVE Couplets were rare (<1.0%), and SVE Triplets were  rare  (<1.0%). Isolated VEs were rare (<1.0%, 292), VE Couplets were rare (<1.0%, 8), and VE Triplets were rare (<1.0%, 1).   Conclusion Paroxysmal SVT, no evidence of A-fib noted Continue meds as prescribed  Cardiac CT 09/2021 IMPRESSION: 1. Coronary calcium score of 59.8. This was 76th percentile for age and sex matched control.   2. Normal coronary origin with right dominance.   3. Calcified plaque causing minimal stenosis in the proximal LAD, RCA, LCx arteries.   4. CAD-RADS 1. Minimal non-obstructive CAD (0-24%). Consider non-atherosclerotic causes of chest pain. Consider preventive therapy and risk factor modification.   Electronically Signed: By: Debbe Odea M.D. On: 09/21/2021 15:58  EKG:  EKG is ordered today.  The ekg ordered today demonstrates NSR 68bpm, TWI III, iRBBB, no significant changes  Recent Labs: 08/10/2022: ALT 12; TSH 1.90 08/17/2022: Magnesium 2.0 08/30/2022: BUN 29; Creatinine, Ser 0.64; Hemoglobin 12.7; Platelets 343; Potassium 3.9; Sodium 136  Recent Lipid Panel    Component Value Date/Time   CHOL 138 07/11/2022 0659   CHOL 168 07/31/2021 0905   TRIG 82 07/11/2022 0659   HDL 61 07/11/2022 0659   HDL 67 07/31/2021 0905   CHOLHDL 2.3 07/11/2022 0659   VLDL 16 07/11/2022 0659   LDLCALC 61 07/11/2022 0659   LDLCALC 85 07/31/2021 0905    Physical Exam:    VS:  BP (!) 143/82   Pulse  68   Ht 5\' 6"  (1.676 m)   Wt 277 lb (125.6 kg)   BMI 44.71 kg/m     Wt Readings from Last 3 Encounters:  09/24/22 277 lb (125.6 kg)  09/07/22 279 lb (126.6 kg)  08/30/22 278 lb (126.1 kg)     GEN:  Well nourished, well developed in no acute distress HEENT: Normal NECK: No JVD; No carotid bruits LYMPHATICS: No lymphadenopathy CARDIAC: RRR, no murmurs, rubs, gallops RESPIRATORY:  Clear to auscultation without rales, wheezing or rhonchi  ABDOMEN: Soft, non-tender, non-distended MUSCULOSKELETAL:  No edema; No deformity  SKIN: Warm and dry NEUROLOGIC:   Alert and oriented x 3 PSYCHIATRIC:  Normal affect   ASSESSMENT:    1. Essential hypertension   2. GI symptoms   3. Chest pain of uncertain etiology   4. SVT (supraventricular tachycardia)   5. Hyperlipidemia, mixed   6. Morbid obesity    PLAN:    In order of problems listed above:  HTN Chest pain/GI symptoms Patient reports she has felt GI symptoms and chest pain since starting the Cardizem. She was previously on Verapamil. Low suspicion this is her heart given Cardiac CTA [phone] with minimal CAD. She has been through a surgery and had cellulitis in the last month. We will stop cardizem and restart Verapamil 120mg  BID. Continue Spironolactone 25mg  daily. We will see her back in a month to reassess symptoms.  pSVT Stop Verapamil and restart Cardizem as above.  HLD Continue Crestor 10mg  daily.   Obesity Weight loss recommended.   Disposition: Follow up in 1 month(s) with MD/APP    Signed, Libbi Towner David Stall, PA-C  09/24/2022 4:34 PM    [facility]

## 2022-09-24 NOTE — Patient Instructions (Signed)
Medication Instructions:  Your physician has recommended you make the following change in your medication:   START - verapamil (CALAN-SR) 120 MG CR tablet 120 mg - Take 1 tablet by mouth 2 times daily  STOP - CARDIZEM 30 MG tablet  *If you need a refill on your cardiac medications before your next appointment, please call your pharmacy*  Lab Work: -None ordered If you have labs (blood work) drawn today and your tests are completely normal, you will receive your results only by: MyChart Message (if you have MyChart) OR A paper copy in the mail If you have any lab test that is abnormal or we need to change your treatment, we will call you to review the results.  Testing/Procedures: -None ordered  Follow-Up: At [facility], you and your health needs are our priority.  As part of our continuing mission to provide you with exceptional heart care, we have created designated Provider Care Teams.  These Care Teams include your primary Cardiologist (physician) and Advanced Practice Providers (APPs -  Physician Assistants and Nurse Practitioners) who all work together to provide you with the care you need, when you need it.  We recommend signing up for the patient portal called "MyChart".  Sign up information is provided on this After Visit Summary.  MyChart is used to connect with patients for Virtual Visits (Telemedicine).  Patients are able to view lab/test results, encounter notes, upcoming appointments, etc.  Non-urgent messages can be sent to your provider as well.   To learn more about what you can do with MyChart, go to [email or web address].    Your next appointment:   1 month(s)  Provider:   You may see Debbe Odea, MD or one of the following Advanced Practice Providers on your designated Care Team:   Nicolasa Ducking, NP Eula Listen, PA-C Cadence Fransico Michael, PA-C Charlsie Quest, NP   Other Instructions -None

## 2022-09-25 ENCOUNTER — Encounter: Payer: Self-pay | Admitting: Internal Medicine

## 2022-09-25 DIAGNOSIS — M180 Bilateral primary osteoarthritis of first carpometacarpal joints: Secondary | ICD-10-CM | POA: Diagnosis not present

## 2022-09-26 ENCOUNTER — Encounter: Payer: HMO | Admitting: Occupational Therapy

## 2022-09-26 ENCOUNTER — Telehealth: Payer: Self-pay | Admitting: Cardiology

## 2022-09-26 ENCOUNTER — Encounter: Payer: Self-pay | Admitting: Cardiology

## 2022-09-26 NOTE — Telephone Encounter (Signed)
Patient sent MyChart message with the same complaints. Replied via MyChart and sent to provider.

## 2022-09-26 NOTE — Telephone Encounter (Signed)
Pt c/o BP issue: STAT if pt c/o blurred vision, one-sided weakness or slurred speech  1. What are your last 5 BP readings? 94/52  2. Are you having any other symptoms (ex. Dizziness, headache, blurred vision, passed out)? Woke up with a bad headache and not feeling well  3. What is your BP issue? Pt is very concerned about her BP being so low when she was just in the office yesterday. Please advise.

## 2022-09-30 DIAGNOSIS — M545 Low back pain, unspecified: Secondary | ICD-10-CM | POA: Diagnosis not present

## 2022-10-01 ENCOUNTER — Encounter: Payer: HMO | Admitting: Occupational Therapy

## 2022-10-03 ENCOUNTER — Encounter: Payer: HMO | Admitting: Occupational Therapy

## 2022-10-08 ENCOUNTER — Encounter: Payer: HMO | Admitting: Occupational Therapy

## 2022-10-10 ENCOUNTER — Encounter: Payer: HMO | Admitting: Occupational Therapy

## 2022-10-10 ENCOUNTER — Other Ambulatory Visit: Payer: Self-pay | Admitting: Family Medicine

## 2022-10-10 NOTE — Telephone Encounter (Signed)
Last office visit 08/17/2022 with Dr. Milinda Antis for Acute Cystitis.  Last refilled 07/25/2022 for #12 with no refills.  Last Vit D level 03/15/2023 which was normal at 54.40 ng/mL.  No future appointments with PCP.

## 2022-10-10 NOTE — Telephone Encounter (Signed)
Error

## 2022-10-15 ENCOUNTER — Encounter: Payer: HMO | Admitting: Occupational Therapy

## 2022-10-17 ENCOUNTER — Encounter: Payer: HMO | Admitting: Occupational Therapy

## 2022-10-22 ENCOUNTER — Encounter: Payer: HMO | Admitting: Occupational Therapy

## 2022-10-24 ENCOUNTER — Encounter: Payer: HMO | Admitting: Occupational Therapy

## 2022-10-25 ENCOUNTER — Ambulatory Visit: Payer: HMO | Admitting: Medical

## 2022-10-26 ENCOUNTER — Encounter: Payer: Self-pay | Admitting: *Deleted

## 2022-10-29 ENCOUNTER — Encounter: Payer: HMO | Admitting: Occupational Therapy

## 2022-10-31 ENCOUNTER — Encounter: Payer: HMO | Admitting: Occupational Therapy

## 2022-11-01 ENCOUNTER — Encounter: Payer: Self-pay | Admitting: *Deleted

## 2022-11-02 ENCOUNTER — Ambulatory Visit
Admission: RE | Admit: 2022-11-02 | Discharge: 2022-11-02 | Disposition: A | Discharge status: Home / Self Care | Payer: HMO | Attending: Gastroenterology | Admitting: Gastroenterology

## 2022-11-02 ENCOUNTER — Encounter: Payer: Self-pay | Admitting: *Deleted

## 2022-11-02 ENCOUNTER — Encounter
Admission: RE | Disposition: A | Discharge status: Home / Self Care | Payer: Self-pay | Source: Home / Self Care | Attending: Gastroenterology

## 2022-11-02 ENCOUNTER — Ambulatory Visit: Payer: HMO | Admitting: Physician Assistant

## 2022-11-02 ENCOUNTER — Ambulatory Visit: Payer: HMO | Admitting: Certified Registered Nurse Anesthetist

## 2022-11-02 DIAGNOSIS — K449 Diaphragmatic hernia without obstruction or gangrene: Secondary | ICD-10-CM | POA: Insufficient documentation

## 2022-11-02 DIAGNOSIS — E7800 Pure hypercholesterolemia, unspecified: Secondary | ICD-10-CM | POA: Insufficient documentation

## 2022-11-02 DIAGNOSIS — R1310 Dysphagia, unspecified: Secondary | ICD-10-CM | POA: Diagnosis not present

## 2022-11-02 DIAGNOSIS — K2100 Gastro-esophageal reflux disease with esophagitis, without bleeding: Secondary | ICD-10-CM | POA: Diagnosis not present

## 2022-11-02 DIAGNOSIS — K219 Gastro-esophageal reflux disease without esophagitis: Secondary | ICD-10-CM | POA: Diagnosis not present

## 2022-11-02 DIAGNOSIS — Z9884 Bariatric surgery status: Secondary | ICD-10-CM | POA: Diagnosis not present

## 2022-11-02 DIAGNOSIS — K224 Dyskinesia of esophagus: Secondary | ICD-10-CM | POA: Diagnosis not present

## 2022-11-02 DIAGNOSIS — I2510 Atherosclerotic heart disease of native coronary artery without angina pectoris: Secondary | ICD-10-CM | POA: Diagnosis not present

## 2022-11-02 DIAGNOSIS — I10 Essential (primary) hypertension: Secondary | ICD-10-CM | POA: Diagnosis not present

## 2022-11-02 DIAGNOSIS — J45909 Unspecified asthma, uncomplicated: Secondary | ICD-10-CM | POA: Diagnosis not present

## 2022-11-02 HISTORY — DX: Lymphedema, not elsewhere classified: I89.0

## 2022-11-02 HISTORY — PX: ESOPHAGOGASTRODUODENOSCOPY (EGD) WITH PROPOFOL: SHX5813

## 2022-11-02 HISTORY — DX: Occipital neuralgia: M54.81

## 2022-11-02 SURGERY — ESOPHAGOGASTRODUODENOSCOPY (EGD) WITH PROPOFOL
Anesthesia: General

## 2022-11-02 MED ORDER — SODIUM CHLORIDE 0.9 % IV SOLN: INTRAVENOUS | Status: DC | PRN

## 2022-11-02 MED ORDER — SODIUM CHLORIDE 0.9 % IV SOLN
INTRAVENOUS | Status: DC
  Administered 2022-11-02: 1000 mL via INTRAVENOUS

## 2022-11-02 MED ORDER — GLYCOPYRROLATE 0.2 MG/ML IJ SOLN
INTRAMUSCULAR | Status: DC | PRN
  Administered 2022-11-02: .1 mg via INTRAVENOUS

## 2022-11-02 MED ORDER — PROPOFOL 500 MG/50ML IV EMUL
INTRAVENOUS | Status: DC | PRN
  Administered 2022-11-02: 100 mg via INTRAVENOUS
  Administered 2022-11-02: 50 ug/kg/min via INTRAVENOUS

## 2022-11-02 MED ORDER — LIDOCAINE HCL (CARDIAC) PF 100 MG/5ML IV SOSY
PREFILLED_SYRINGE | INTRAVENOUS | Status: DC | PRN
  Administered 2022-11-02: 50 mg via INTRAVENOUS

## 2022-11-02 NOTE — Op Note (Signed)
[facility] Gastroenterology Patient Name: Kimberly Patton Procedure Date: 11/02/2022 11:54 AM MRN: [id number] Account #: [id number] Date of Birth: Oct 06, 1955 Admit Type: Outpatient Age: 67 Room: [facility] ENDO ROOM 3 Gender: Female Note Status: Finalized Instrument Name: Patton Salles Endoscope [id number] Procedure:             Upper GI endoscopy Indications:           Gastro-esophageal reflux disease Providers:             Eather Colas MD, MD Medicines:             Monitored Anesthesia Care Complications:         No immediate complications. Procedure:             Pre-Anesthesia Assessment:                        - Prior to the procedure, a History and Physical was                         performed, and patient medications and allergies were                         reviewed. The patient is competent. The risks and                         benefits of the procedure and the sedation options and                         risks were discussed with the patient. All questions                         were answered and informed consent was obtained.                         Patient identification and proposed procedure were                         verified by the physician, the nurse, the                         anesthesiologist, the anesthetist and the technician                         in the endoscopy suite. Mental Status Examination:                         alert and oriented. Airway Examination: normal                         oropharyngeal airway and neck mobility. Respiratory                         Examination: clear to auscultation. CV Examination:                         normal. Prophylactic Antibiotics: The patient does not                         require prophylactic antibiotics. Prior  Anticoagulants: The patient has taken no anticoagulant                         or antiplatelet agents. ASA Grade Assessment: III - A                         patient with  severe systemic disease. After reviewing                         the risks and benefits, the patient was deemed in                         satisfactory condition to undergo the procedure. The                         anesthesia plan was to use monitored anesthesia care                         (MAC). Immediately prior to administration of                         medications, the patient was re-assessed for adequacy                         to receive sedatives. The heart rate, respiratory                         rate, oxygen saturations, blood pressure, adequacy of                         pulmonary ventilation, and response to care were                         monitored throughout the procedure. The physical                         status of the patient was re-assessed after the                         procedure.                        After obtaining informed consent, the endoscope was                         passed under direct vision. Throughout the procedure,                         the patient's blood pressure, pulse, and oxygen                         saturations were monitored continuously. The Endoscope                         was introduced through the mouth, and advanced to the                         second part of duodenum. The upper GI endoscopy was  accomplished without difficulty. The patient tolerated                         the procedure well. Findings:      A 3 cm hiatal hernia was present.      LA Grade A (one or more mucosal breaks less than 5 mm, not extending       between tops of 2 mucosal folds) esophagitis with no bleeding was found.      The exam of the esophagus was otherwise normal.      Evidence of a sleeve gastrectomy was found in the stomach. This was       characterized by healthy appearing mucosa.      The examined duodenum was normal. Impression:            - 3 cm hiatal hernia.                        - LA Grade A reflux esophagitis  with no bleeding.                        - A sleeve gastrectomy was found, characterized by                         healthy appearing mucosa.                        - Normal examined duodenum.                        - No specimens collected. Recommendation:        - Discharge patient to home.                        - Resume previous diet.                        - Continue present medications.                        - Return to referring physician as previously                         scheduled. Procedure Code(s):     --- Professional ---                        [phone], Esophagogastroduodenoscopy, flexible,                         transoral; diagnostic, including collection of                         specimen(s) by brushing or washing, when performed                         (separate procedure) Diagnosis Code(s):     --- Professional ---                        K44.9, Diaphragmatic hernia without obstruction or  gangrene                        K21.00, Gastro-esophageal reflux disease with                         esophagitis, without bleeding                        Z98.84, Bariatric surgery status CPT copyright 2022 American Medical Association. All rights reserved. The codes documented in this report are preliminary and upon coder review may  be revised to meet current compliance requirements. Eather Colas MD, MD 11/02/2022 1:05:12 PM Number of Addenda: 0 Note Initiated On: 11/02/2022 11:54 AM Estimated Blood Loss:  Estimated blood loss: none.      [facility]

## 2022-11-02 NOTE — Interval H&P Note (Signed)
History and Physical Interval Note:  11/02/2022 12:53 PM  Kimberly Patton  has presented today for surgery, with the diagnosis of GERD Hiatal Hernia Esophageal Dysmotility Dysphagia.  The various methods of treatment have been discussed with the patient and family. After consideration of risks, benefits and other options for treatment, the patient has consented to  Procedure(s): ESOPHAGOGASTRODUODENOSCOPY (EGD) WITH PROPOFOL (N/A) as a surgical intervention.  The patient's history has been reviewed, patient examined, no change in status, stable for surgery.  I have reviewed the patient's chart and labs.  Questions were answered to the patient's satisfaction.     Regis Bill  Ok to proceed with EGD

## 2022-11-02 NOTE — Anesthesia Preprocedure Evaluation (Signed)
Anesthesia Evaluation  Patient identified by MRN, date of birth, ID band Patient awake    Reviewed: Allergy & Precautions, NPO status , Patient's Chart, lab work & pertinent test results  Airway Mallampati: II  TM Distance: >3 FB Neck ROM: full    Dental  (+) Teeth Intact   Pulmonary neg pulmonary ROS, asthma    Pulmonary exam normal breath sounds clear to auscultation       Cardiovascular Exercise Tolerance: Good hypertension, Pt. on medications + CAD  negative cardio ROS Normal cardiovascular exam Rhythm:Regular Rate:Normal     Neuro/Psych  Headaches  Anxiety     negative neurological ROS  negative psych ROS   GI/Hepatic negative GI ROS, Neg liver ROS,GERD  Medicated,,  Endo/Other  negative endocrine ROS    Renal/GU negative Renal ROS  negative genitourinary   Musculoskeletal  (+) Arthritis ,    Abdominal  (+) + obese  Peds negative pediatric ROS (+)  Hematology negative hematology ROS (+) Blood dyscrasia, anemia   Anesthesia Other Findings Past Medical History: 04/03/2007: ALLERGIC RHINITIS No date: Allergy     Comment:  Not sure No date: Anemia     Comment:  ((Pt Qnr Sub: Denies at visit from 08/28/2021 with               endocrinology))  No date: Anginal pain (HCC) No date: Anxiety Not sure: Arthritis No date: Asthma     Comment:  hx of years ago - no longer a problem  04/03/2007: ASTHMA, PERSISTENT, MODERATE No date: Cancer (HCC)     Comment:  skin  No date: Dyspnea     Comment:  with exertion No date: Dyspnea on exertion     Comment:  a. 11/2007 Echo: EF 60%. 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 11/2011 48h Holter: RSR, rare PVC's, occas PAC's 01/2016: Retinal tear of right eye 04/15/2018: Thyroid nodule No date: Ventral hernia  Past Surgical History: Think 1994: ABDOMINAL HYSTERECTOMY 1996: APPENDECTOMY 03/21/2022: ARTERY BIOPSY; Left     Comment:  Procedure: BIOPSY TEMPORAL ARTERY;  Surgeon: Renford Dills, MD;  Location: [facility];  Service: Vascular;                Laterality: Left; No date: BIOPSY THYROID     Comment:  08/2021 1994: BREAST CYST EXCISION; Right     Comment:  Benign No date: CESAREAN SECTION     Comment:  x2 1995: CHOLECYSTECTOMY 05/09/2021: COLONOSCOPY WITH PROPOFOL; N/A     Comment:  Procedure: COLONOSCOPY WITH PROPOFOL;  Surgeon: Wyline Mood, MD;  Location: [facility];  Service:               Gastroenterology;  Laterality: N/A; 01/2016: EYE SURGERY     Comment:  Repair retinal tear 1995 / 1996: FOOT SURGERY     Comment:  x2 No date: HERNIA REPAIR 07/25/2013: KNEE ARTHROSCOPY W/ MENISCAL REPAIR     Comment:  right knee Dr. Dion Saucier 06/08/2013: LAPAROSCOPIC GASTRIC SLEEVE RESECTION; N/A     Comment:  Procedure: LAPAROSCOPIC GASTRIC SLEEVE RESECTION AND               EXCISION OF SEBACEUS CYST FROM MID CHEST takedown of               incarcerated ventral hernia and primary repair endoscopy;              Surgeon: Valarie Merino, MD;  Location: [facility];                Service: General;  Laterality: N/A; 02/02/2020: LAPAROSCOPIC NISSEN FUNDOPLICATION; N/A     Comment:  Procedure: LAPAROSCOPIC REPAIR LARGE SYMPTOMATIC HIATAL               HERNIA WITH UPPER ENDOSCOPY;  Surgeon: Luretha Murphy,                MD;  Location: [facility];  Service: General;  Laterality:               N/A; 06/2013: moles     Comment:  removed 2 moles from under arm and  lowback No date: NECK SURGERY     Comment:  occipital nerve damage- injections  1970: OVARIAN CYST REMOVAL 1975: PILONIDAL CYST EXCISION 06/23/2021: RADIOACTIVE SEED GUIDED EXCISIONAL BREAST BIOPSY; Right     Comment:  Procedure: RADIOACTIVE SEED GUIDED EXCISIONAL RIGHT               BREAST BIOPSY;  Surgeon: Luretha Murphy, MD;  Location:               [facility];  Service: General;                Laterality: Right; 01/2016: RETINAL TEAR REPAIR CRYOTHERAPY; Right     Comment:  [place] No date: SKIN CANCER EXCISION 08/21/2022: THYROID LOBECTOMY; Left     Comment:  Procedure: LEFT THYROID LOBECTOMY;  Surgeon: Darnell Level, MD;  Location: [facility];  Service: General;                Laterality: Left; No date: THYROIDECTOMY, PARTIAL No date: TONSILLECTOMY Think 1994: TUBAL LIGATION 06/08/2013: UPPER GI ENDOSCOPY     Comment:  Procedure: UPPER GI ENDOSCOPY;  Surgeon: Valarie Merino, MD;  Location: [facility];  Service: General;; 12/2011: US ECHOCARDIOGRAPHY     Comment:  WNL - EF 55-60%, mild MR, grade 1 diastolic dysnfiction               (mild) 2000: VENTRAL HERNIA REPAIR  BMI    Body Mass Index: 44.75 kg/m      Reproductive/Obstetrics negative OB ROS                             Anesthesia Physical Anesthesia Plan  ASA: 3  Anesthesia Plan: General   Post-op Pain Management:    Induction:  PONV Risk Score and Plan: Propofol infusion and TIVA  Airway Management Planned: Natural Airway  Additional Equipment:   Intra-op Plan:   Post-operative Plan:   Informed Consent: I have reviewed the patients History and Physical, chart, labs and discussed the procedure including the risks, benefits and alternatives for the proposed anesthesia with the patient or  authorized representative who has indicated his/her understanding and acceptance.     Dental Advisory Given  Plan Discussed with: CRNA and Surgeon  Anesthesia Plan Comments:        Anesthesia Quick Evaluation

## 2022-11-02 NOTE — Transfer of Care (Signed)
Immediate Anesthesia Transfer of Care Note  Patient: Kimberly Patton  Procedure(s) Performed: ESOPHAGOGASTRODUODENOSCOPY (EGD) WITH PROPOFOL  Patient Location: Endoscopy Unit  Anesthesia Type:General  Level of Consciousness: drowsy and patient cooperative  Airway & Oxygen Therapy: Patient Spontanous Breathing  Post-op Assessment: Report given to RN and Post -op Vital signs reviewed and stable  Post vital signs: Reviewed and stable  Last Vitals:  Vitals Value Taken Time  BP 120/72 11/02/22 1304  Temp    Pulse 72 11/02/22 1304  Resp 13 11/02/22 1304  SpO2 96 % 11/02/22 1304  Vitals shown include unvalidated device data.  Last Pain:  Vitals:   11/02/22 1151  TempSrc: Temporal  PainSc: 5       Patients Stated Pain Goal: 0 (11/02/22 1151)  Complications: No notable events documented.

## 2022-11-02 NOTE — Anesthesia Postprocedure Evaluation (Signed)
Anesthesia Post Note  Patient: Kimberly Patton  Procedure(s) Performed: ESOPHAGOGASTRODUODENOSCOPY (EGD) WITH PROPOFOL  Patient location during evaluation: PACU Anesthesia Type: General Level of consciousness: awake and awake and alert Pain management: pain level controlled Vital Signs Assessment: post-procedure vital signs reviewed and stable Respiratory status: spontaneous breathing and nonlabored ventilation Cardiovascular status: stable Anesthetic complications: no   No notable events documented.   Last Vitals:  Vitals:   11/02/22 1151 11/02/22 1304  BP: 117/65 120/72  Pulse: (!) 57 72  Resp: 18 20  Temp: (!) 36.3 C 37 C  SpO2: 100% 96%    Last Pain:  Vitals:   11/02/22 1304  TempSrc: Temporal  PainSc: Asleep                 VAN STAVEREN,Jeanene Mena

## 2022-11-02 NOTE — H&P (Signed)
Outpatient short stay form Pre-procedure 11/02/2022  Regis Bill, MD  Primary Physician: Excell Seltzer, MD  Reason for visit:  GERD  History of present illness:    67 y/o lady with history of obesity with sleeve gastrectomy and hiatal hernia repair here for EGD for severe GERD. No blood thinners. No family history of GI malignancies.    Current Facility-Administered Medications:    0.9 %  sodium chloride infusion, , Intravenous, Continuous, Dailey Alberson, Rossie Muskrat, MD, Last Rate: 20 mL/hr at 11/02/22 1155, 1,000 mL at 11/02/22 1155  Facility-Administered Medications Ordered in Other Encounters:    0.9 %  sodium chloride infusion, , Intravenous, Continuous PRN, Chelsea Aus, CRNA, New Bag at 11/02/22 1248   albuterol (PROVENTIL) (2.5 MG/3ML) 0.083% nebulizer solution 2.5 mg, 2.5 mg, Nebulization, Once, Salena Saner, MD   glycopyrrolate (ROBINUL) injection, , Intravenous, Anesthesia Intra-op, Chelsea Aus, CRNA, 0.1 mg at 11/02/22 1250   lidocaine (cardiac) 100 mg/98mL (XYLOCAINE) injection 2%, , Intravenous, Anesthesia Intra-op, Chelsea Aus, CRNA, 50 mg at 11/02/22 1250   propofol (DIPRIVAN) 500 MG/50ML infusion, , Intravenous, Continuous PRN, Chelsea Aus, CRNA, Last Rate: 30 mL/hr at 11/02/22 1251, 50 mcg/kg/min at 11/02/22 1251  Medications Prior to Admission  Medication Sig Dispense Refill Last Dose   azelastine (ASTELIN) 0.1 % nasal spray Place into both nostrils 2 (two) times daily. Use in each nostril as directed      Calcium Citrate-Vitamin D (CITRACAL + D PO) Take 2 tablets by mouth daily.    Past Week   Cholecalciferol (VITAMIN D3) 1.25 MG (50000 UT) CAPS TAKE 1 CAPSULE (50,000 UNITS TOTAL) BY MOUTH ONCE A WEEK. TUESDAY 12 capsule 0 Past Week   Coenzyme Q10-Vitamin E (QUNOL ULTRA COQ10) 100-150 MG-UNIT CAPS Take 100 mg by mouth daily.   Past Week   Cyanocobalamin (VITAMIN B-12) 2500 MCG SUBL Place 2,500 mcg under the tongue daily.   Past Week    famotidine (PEPCID) 40 MG tablet Take 40 mg by mouth at bedtime.   11/01/2022   gabapentin (NEURONTIN) 100 MG capsule Take 100 mg by mouth daily.   11/01/2022   hydrochlorothiazide (HYDRODIURIL) 25 MG tablet Take 25 mg by mouth daily.      Lactobacillus Rhamnosus, GG, (CULTURELLE IMMUNITY SUPPORT PO) Take 1 capsule by mouth daily.   Past Week   Menaquinone-7 (VITAMIN K2 PO) Take 100 mg by mouth daily.   Past Week   montelukast (SINGULAIR) 10 MG tablet TAKE 1 TABLET BY MOUTH EVERYDAY AT BEDTIME 90 tablet 1 11/01/2022   Multiple Vitamins-Minerals (CENTRUM SILVER 50+WOMEN PO) Take 1 tablet by mouth daily.   Past Week   Omega-3 Fatty Acids (FISH OIL) 1200 MG CAPS Take 1,200 mg by mouth daily.   Past Week   Riboflavin 400 MG CAPS Take 400 mg by mouth daily.   Past Week   rosuvastatin (CRESTOR) 10 MG tablet Take 1 tablet (10 mg total) by mouth daily. (Patient taking differently: Take 10 mg by mouth at bedtime.) 90 tablet 3 11/01/2022   spironolactone (ALDACTONE) 25 MG tablet Take 1 tablet (25 mg total) by mouth daily. 90 tablet 3 11/02/2022   traMADol (ULTRAM) 50 MG tablet Take 1 tablet (50 mg total) by mouth every 6 (six) hours as needed. 12 tablet 0 Past Week at prn   TURMERIC PO Take 2 tablets by mouth daily. 500 mg  With Ginger 50 mg Gummies   Past Week   verapamil (CALAN-SR) 120 MG CR tablet  Take 1 tablet (120 mg total) by mouth 2 (two) times daily. 180 tablet 3 11/02/2022   albuterol (VENTOLIN HFA) 108 (90 Base) MCG/ACT inhaler Inhale 2 puffs into the lungs every 6 (six) hours as needed for wheezing or shortness of breath. 8 g 2  at prn   diltiazem (CARDIZEM CD) 120 MG 24 hr capsule Take 1 capsule (120 mg total) by mouth 2 (two) times daily. 180 capsule 0    fluticasone-salmeterol (WIXELA INHUB) 250-50 MCG/ACT AEPB Inhale 1 puff into the lungs in the morning and at bedtime. (Patient taking differently: Inhale 1 puff into the lungs daily.) 60 each 2    methocarbamol (ROBAXIN) 500 MG tablet Take 500 mg by  mouth at bedtime as needed for muscle spasms.    at prn   nitroGLYCERIN (NITROSTAT) 0.4 MG SL tablet Place 1 tablet (0.4 mg total) under the tongue every 5 (five) minutes as needed for chest pain. 25 tablet 3  at prn   pantoprazole (PROTONIX) 40 MG tablet TAKE 1 TABLET BY MOUTH EVERY DAY (Patient taking differently: Take 40 mg by mouth daily.) 90 tablet 1    Zoledronic Acid (RECLAST IV) Inject into the vein. Yearly injection (Patient not taking: Reported on 11/02/2022)   Completed Course     Allergies  Allergen Reactions   Wasp Venom Anaphylaxis   Chlorhexidine Gluconate     Itching under breasts   Metronidazole Other (See Comments)    Chest tightness, neck tightness   Prednisone Other (See Comments)    Severe migraine / with taper dose Told by Cardiologist to avoid due to Afib   Other Rash    Bleach   Sodium Hypochlorite Rash    Liquid clorox bleach     Past Medical History:  Diagnosis Date   ALLERGIC RHINITIS 04/03/2007   Allergy    Not sure   Anemia    ((Pt Qnr Sub: Denies at visit from 08/28/2021 with endocrinology))    Anginal pain (HCC)    Anxiety    Arthritis Not sure   Asthma    hx of years ago - no longer a problem    ASTHMA, PERSISTENT, MODERATE 04/03/2007   Cancer (HCC)    skin    Dyspnea    with exertion   Dyspnea on exertion    a. 11/2007 Echo: EF 60%.   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 11/2011 48h Holter: RSR, rare PVC's, occas PAC's   Retinal tear of right eye 01/2016   Thyroid  nodule 04/15/2018   Ventral hernia     Review of systems:  Otherwise negative.    Physical Exam  Gen: Alert, oriented. Appears stated age.  HEENT: PERRLA. Lungs: No respiratory distress CV: RRR Abd: soft, benign, no masses Ext: No edema    Planned procedures: Proceed with EGD. The patient understands the nature of the  planned procedure, indications, risks, alternatives and potential complications including but not limited to bleeding, infection, perforation, damage to internal organs and possible oversedation/side effects from anesthesia. The patient agrees and gives consent to proceed.  Please refer to procedure notes for findings, recommendations and patient disposition/instructions.     Regis Bill, MD [facility]

## 2022-11-06 ENCOUNTER — Encounter: Payer: Self-pay | Admitting: Gastroenterology

## 2022-11-06 DIAGNOSIS — M7701 Medial epicondylitis, right elbow: Secondary | ICD-10-CM | POA: Diagnosis not present

## 2022-11-06 DIAGNOSIS — M654 Radial styloid tenosynovitis [de Quervain]: Secondary | ICD-10-CM | POA: Diagnosis not present

## 2022-11-07 ENCOUNTER — Encounter: Payer: HMO | Admitting: Occupational Therapy

## 2022-11-07 DIAGNOSIS — K2100 Gastro-esophageal reflux disease with esophagitis, without bleeding: Secondary | ICD-10-CM | POA: Diagnosis not present

## 2022-11-07 DIAGNOSIS — K449 Diaphragmatic hernia without obstruction or gangrene: Secondary | ICD-10-CM | POA: Diagnosis not present

## 2022-11-07 DIAGNOSIS — Z9884 Bariatric surgery status: Secondary | ICD-10-CM | POA: Diagnosis not present

## 2022-11-09 ENCOUNTER — Other Ambulatory Visit: Payer: Self-pay

## 2022-11-09 ENCOUNTER — Observation Stay: Payer: HMO

## 2022-11-09 ENCOUNTER — Inpatient Hospital Stay
Admission: EM | Admit: 2022-11-09 | Discharge: 2022-11-12 | DRG: 309 | Disposition: A | Discharge status: Home / Self Care | Payer: HMO | Attending: Internal Medicine | Admitting: Internal Medicine

## 2022-11-09 DIAGNOSIS — J45909 Unspecified asthma, uncomplicated: Secondary | ICD-10-CM | POA: Diagnosis present

## 2022-11-09 DIAGNOSIS — Z9049 Acquired absence of other specified parts of digestive tract: Secondary | ICD-10-CM

## 2022-11-09 DIAGNOSIS — R079 Chest pain, unspecified: Secondary | ICD-10-CM | POA: Diagnosis not present

## 2022-11-09 DIAGNOSIS — E7800 Pure hypercholesterolemia, unspecified: Secondary | ICD-10-CM | POA: Diagnosis not present

## 2022-11-09 DIAGNOSIS — Z85828 Personal history of other malignant neoplasm of skin: Secondary | ICD-10-CM

## 2022-11-09 DIAGNOSIS — Z79899 Other long term (current) drug therapy: Secondary | ICD-10-CM

## 2022-11-09 DIAGNOSIS — I480 Paroxysmal atrial fibrillation: Secondary | ICD-10-CM | POA: Diagnosis not present

## 2022-11-09 DIAGNOSIS — I959 Hypotension, unspecified: Secondary | ICD-10-CM | POA: Diagnosis present

## 2022-11-09 DIAGNOSIS — I4891 Unspecified atrial fibrillation: Secondary | ICD-10-CM | POA: Diagnosis not present

## 2022-11-09 DIAGNOSIS — Z90710 Acquired absence of both cervix and uterus: Secondary | ICD-10-CM

## 2022-11-09 DIAGNOSIS — Z841 Family history of disorders of kidney and ureter: Secondary | ICD-10-CM

## 2022-11-09 DIAGNOSIS — I700 Atherosclerosis of aorta: Secondary | ICD-10-CM | POA: Diagnosis not present

## 2022-11-09 DIAGNOSIS — Z883 Allergy status to other anti-infective agents status: Secondary | ICD-10-CM

## 2022-11-09 DIAGNOSIS — Z803 Family history of malignant neoplasm of breast: Secondary | ICD-10-CM

## 2022-11-09 DIAGNOSIS — Z823 Family history of stroke: Secondary | ICD-10-CM

## 2022-11-09 DIAGNOSIS — Z86011 Personal history of benign neoplasm of the brain: Secondary | ICD-10-CM

## 2022-11-09 DIAGNOSIS — Z6841 Body Mass Index (BMI) 40.0 and over, adult: Secondary | ICD-10-CM | POA: Diagnosis present

## 2022-11-09 DIAGNOSIS — Z832 Family history of diseases of the blood and blood-forming organs and certain disorders involving the immune mechanism: Secondary | ICD-10-CM

## 2022-11-09 DIAGNOSIS — R0789 Other chest pain: Secondary | ICD-10-CM | POA: Diagnosis not present

## 2022-11-09 DIAGNOSIS — R002 Palpitations: Secondary | ICD-10-CM | POA: Diagnosis not present

## 2022-11-09 DIAGNOSIS — Z8249 Family history of ischemic heart disease and other diseases of the circulatory system: Secondary | ICD-10-CM

## 2022-11-09 DIAGNOSIS — F419 Anxiety disorder, unspecified: Secondary | ICD-10-CM | POA: Diagnosis present

## 2022-11-09 DIAGNOSIS — R0602 Shortness of breath: Secondary | ICD-10-CM | POA: Diagnosis not present

## 2022-11-09 DIAGNOSIS — Z888 Allergy status to other drugs, medicaments and biological substances status: Secondary | ICD-10-CM

## 2022-11-09 DIAGNOSIS — I10 Essential (primary) hypertension: Secondary | ICD-10-CM

## 2022-11-09 DIAGNOSIS — Z833 Family history of diabetes mellitus: Secondary | ICD-10-CM

## 2022-11-09 DIAGNOSIS — J4520 Mild intermittent asthma, uncomplicated: Secondary | ICD-10-CM

## 2022-11-09 DIAGNOSIS — Z91030 Bee allergy status: Secondary | ICD-10-CM

## 2022-11-09 DIAGNOSIS — I2510 Atherosclerotic heart disease of native coronary artery without angina pectoris: Secondary | ICD-10-CM | POA: Diagnosis present

## 2022-11-09 DIAGNOSIS — K219 Gastro-esophageal reflux disease without esophagitis: Secondary | ICD-10-CM | POA: Diagnosis present

## 2022-11-09 DIAGNOSIS — Z1401 Asymptomatic hemophilia A carrier: Secondary | ICD-10-CM

## 2022-11-09 LAB — BASIC METABOLIC PANEL
Anion gap: 10 (ref 5–15)
BUN: 33 mg/dL — ABNORMAL HIGH (ref 8–23)
CO2: 26 mmol/L (ref 22–32)
Calcium: 9.8 mg/dL (ref 8.9–10.3)
Chloride: 100 mmol/L (ref 98–111)
Creatinine, Ser: 0.74 mg/dL (ref 0.44–1.00)
GFR, Estimated: >60 mL/min (ref >60)
Glucose, Bld: 99 mg/dL (ref 70–99)
Potassium: 3.6 mmol/L (ref 3.5–5.1)
Sodium: 136 mmol/L (ref 135–145)

## 2022-11-09 LAB — TSH: TSH: 2.732 u[IU]/mL (ref 0.350–4.500)

## 2022-11-09 LAB — CBC WITH DIFFERENTIAL/PLATELET
Abs Immature Granulocytes: 0.03 10*3/uL (ref 0.00–0.07)
Basophils Absolute: 0 10*3/uL (ref 0.0–0.1)
Basophils Relative: 0 %
Eosinophils Absolute: 0.1 10*3/uL (ref 0.0–0.5)
Eosinophils Relative: 1 %
HCT: 39.6 % (ref 36.0–46.0)
Hemoglobin: 12.5 g/dL (ref 12.0–15.0)
Immature Granulocytes: 0 %
Lymphocytes Relative: 33 %
Lymphs Abs: 2.7 10*3/uL (ref 0.7–4.0)
MCH: 26.2 pg (ref 26.0–34.0)
MCHC: 31.6 g/dL (ref 30.0–36.0)
MCV: 82.8 fL (ref 80.0–100.0)
Monocytes Absolute: 0.5 10*3/uL (ref 0.1–1.0)
Monocytes Relative: 7 %
Neutro Abs: 4.8 10*3/uL (ref 1.7–7.7)
Neutrophils Relative %: 59 %
Platelets: 352 10*3/uL (ref 150–400)
RBC: 4.78 MIL/uL (ref 3.87–5.11)
RDW: 15.2 % (ref 11.5–15.5)
WBC: 8.2 10*3/uL (ref 4.0–10.5)
nRBC: 0 % (ref 0.0–0.2)

## 2022-11-09 LAB — URINALYSIS, ROUTINE W REFLEX MICROSCOPIC
Bilirubin Urine: NEGATIVE
Glucose, UA: NEGATIVE mg/dL
Hgb urine dipstick: NEGATIVE
Ketones, ur: NEGATIVE mg/dL
Leukocytes,Ua: NEGATIVE
Nitrite: NEGATIVE
Protein, ur: NEGATIVE mg/dL
Specific Gravity, Urine: 1.004 — ABNORMAL LOW (ref 1.005–1.030)
pH: 7 (ref 5.0–8.0)

## 2022-11-09 LAB — HEPARIN LEVEL (UNFRACTIONATED)
Heparin Unfractionated: 0.43 IU/mL (ref 0.30–0.70)
Heparin Unfractionated: 0.26 IU/mL — ABNORMAL LOW (ref 0.30–0.70)

## 2022-11-09 LAB — TROPONIN I (HIGH SENSITIVITY)
Troponin I (High Sensitivity): 6 ng/L (ref <18)
Troponin I (High Sensitivity): 6 ng/L (ref <18)
Troponin I (High Sensitivity): 8 ng/L (ref <18)
Troponin I (High Sensitivity): 6 ng/L (ref <18)
Troponin I (High Sensitivity): 7 ng/L (ref <18)

## 2022-11-09 LAB — PROTIME-INR
INR: 1.1 (ref 0.8–1.2)
Prothrombin Time: 14.2 seconds (ref 11.4–15.2)

## 2022-11-09 LAB — HIV ANTIBODY (ROUTINE TESTING W REFLEX): HIV Screen 4th Generation wRfx: NONREACTIVE

## 2022-11-09 LAB — APTT: aPTT: 73 seconds — ABNORMAL HIGH (ref 24–36)

## 2022-11-09 LAB — MAGNESIUM: Magnesium: 2.7 mg/dL — ABNORMAL HIGH (ref 1.7–2.4)

## 2022-11-09 MED ORDER — HEPARIN (PORCINE) 25000 UT/250ML-% IV SOLN
1500.0000 [IU]/h | INTRAVENOUS | Status: DC
  Administered 2022-11-09: 1300 [IU]/h via INTRAVENOUS
  Administered 2022-11-11: 1500 [IU]/h via INTRAVENOUS
  Filled 2022-11-09 (×4): qty 250

## 2022-11-09 MED ORDER — NITROGLYCERIN 0.4 MG SL SUBL: 0.4000 mg | SUBLINGUAL_TABLET | SUBLINGUAL | Status: DC | PRN

## 2022-11-09 MED ORDER — GABAPENTIN 100 MG PO CAPS
100.0000 mg | ORAL_CAPSULE | Freq: Every day | ORAL | Status: DC
  Administered 2022-11-09 – 2022-11-12 (×4): 100 mg via ORAL
  Filled 2022-11-09 (×4): qty 1

## 2022-11-09 MED ORDER — HEPARIN BOLUS VIA INFUSION
1300.0000 [IU] | Freq: Once | INTRAVENOUS | Status: AC
  Administered 2022-11-09: 1300 [IU] via INTRAVENOUS
  Filled 2022-11-09: qty 1300

## 2022-11-09 MED ORDER — HYDRALAZINE HCL 20 MG/ML IJ SOLN: 5.0000 mg | INTRAMUSCULAR | Status: DC | PRN

## 2022-11-09 MED ORDER — COENZYME Q10-VITAMIN E 100-150 MG-UNIT PO CAPS: 100.0000 mg | ORAL_CAPSULE | Freq: Every day | ORAL | Status: DC

## 2022-11-09 MED ORDER — ALBUTEROL SULFATE (2.5 MG/3ML) 0.083% IN NEBU: 2.5000 mg | INHALATION_SOLUTION | RESPIRATORY_TRACT | Status: DC | PRN

## 2022-11-09 MED ORDER — DILTIAZEM HCL 25 MG/5ML IV SOLN
20.0000 mg | Freq: Once | INTRAVENOUS | Status: AC
  Administered 2022-11-09: 20 mg via INTRAVENOUS
  Filled 2022-11-09: qty 5

## 2022-11-09 MED ORDER — MONTELUKAST SODIUM 10 MG PO TABS
10.0000 mg | ORAL_TABLET | Freq: Every day | ORAL | Status: DC
  Administered 2022-11-09 – 2022-11-11 (×3): 10 mg via ORAL
  Filled 2022-11-09 (×3): qty 1

## 2022-11-09 MED ORDER — MAGNESIUM SULFATE 2 GM/50ML IV SOLN
2.0000 g | INTRAVENOUS | Status: AC
  Administered 2022-11-09: 2 g via INTRAVENOUS
  Filled 2022-11-09: qty 50

## 2022-11-09 MED ORDER — SODIUM CHLORIDE 0.9 % IV BOLUS
1000.0000 mL | Freq: Once | INTRAVENOUS | Status: AC
  Administered 2022-11-09: 1000 mL via INTRAVENOUS

## 2022-11-09 MED ORDER — TRAMADOL HCL 50 MG PO TABS: 50.0000 mg | ORAL_TABLET | Freq: Four times a day (QID) | ORAL | Status: DC | PRN

## 2022-11-09 MED ORDER — FAMOTIDINE 20 MG PO TABS
40.0000 mg | ORAL_TABLET | Freq: Once | ORAL | Status: DC
  Filled 2022-11-09: qty 2

## 2022-11-09 MED ORDER — VITAMIN B-12 1000 MCG PO TABS
2500.0000 ug | ORAL_TABLET | Freq: Every day | ORAL | Status: DC
  Administered 2022-11-09 – 2022-11-12 (×4): 2500 ug via ORAL
  Filled 2022-11-09 (×2): qty 2.5
  Filled 2022-11-09 (×2): qty 3

## 2022-11-09 MED ORDER — RISAQUAD PO CAPS
1.0000 | ORAL_CAPSULE | Freq: Every day | ORAL | Status: DC
  Administered 2022-11-09 – 2022-11-12 (×4): 1 via ORAL
  Filled 2022-11-09 (×4): qty 1

## 2022-11-09 MED ORDER — METHOCARBAMOL 500 MG PO TABS: 500.0000 mg | ORAL_TABLET | Freq: Every evening | ORAL | Status: DC | PRN

## 2022-11-09 MED ORDER — HYDROMORPHONE HCL 1 MG/ML IJ SOLN: 0.5000 mg | INTRAMUSCULAR | Status: AC | PRN

## 2022-11-09 MED ORDER — VITAMIN K2 100 MCG PO CAPS: 100.0000 mg | ORAL_CAPSULE | Freq: Every day | ORAL | Status: DC

## 2022-11-09 MED ORDER — DILTIAZEM HCL 30 MG PO TABS
30.0000 mg | ORAL_TABLET | Freq: Two times a day (BID) | ORAL | Status: DC
  Administered 2022-11-09 – 2022-11-12 (×6): 30 mg via ORAL
  Filled 2022-11-09 (×6): qty 1

## 2022-11-09 MED ORDER — RIBOFLAVIN 400 MG PO CAPS: 400.0000 mg | ORAL_CAPSULE | Freq: Every day | ORAL | Status: DC

## 2022-11-09 MED ORDER — FAMOTIDINE 20 MG PO TABS
20.0000 mg | ORAL_TABLET | Freq: Every day | ORAL | Status: DC
  Administered 2022-11-09 – 2022-11-11 (×3): 20 mg via ORAL
  Filled 2022-11-09 (×3): qty 1

## 2022-11-09 MED ORDER — HEPARIN BOLUS VIA INFUSION
4500.0000 [IU] | Freq: Once | INTRAVENOUS | Status: AC
  Administered 2022-11-09: 4500 [IU] via INTRAVENOUS
  Filled 2022-11-09: qty 4500

## 2022-11-09 MED ORDER — DM-GUAIFENESIN ER 30-600 MG PO TB12: 1.0000 | ORAL_TABLET | Freq: Two times a day (BID) | ORAL | Status: DC | PRN

## 2022-11-09 MED ORDER — ADULT MULTIVITAMIN W/MINERALS CH
1.0000 | ORAL_TABLET | Freq: Every day | ORAL | Status: DC
  Administered 2022-11-09 – 2022-11-12 (×4): 1 via ORAL
  Filled 2022-11-09 (×4): qty 1

## 2022-11-09 MED ORDER — PANTOPRAZOLE SODIUM 40 MG PO TBEC
40.0000 mg | DELAYED_RELEASE_TABLET | Freq: Every day | ORAL | Status: DC
  Administered 2022-11-09 – 2022-11-11 (×3): 40 mg via ORAL
  Filled 2022-11-09 (×3): qty 1

## 2022-11-09 MED ORDER — MOMETASONE FURO-FORMOTEROL FUM 200-5 MCG/ACT IN AERO
2.0000 | INHALATION_SPRAY | Freq: Two times a day (BID) | RESPIRATORY_TRACT | Status: DC
  Filled 2022-11-09 (×2): qty 8.8

## 2022-11-09 MED ORDER — ASPIRIN 81 MG PO CHEW
324.0000 mg | CHEWABLE_TABLET | Freq: Once | ORAL | Status: AC
  Administered 2022-11-09: 324 mg via ORAL
  Filled 2022-11-09: qty 4

## 2022-11-09 MED ORDER — DILTIAZEM HCL-DEXTROSE 125-5 MG/125ML-% IV SOLN (PREMIX)
5.0000 mg/h | INTRAVENOUS | Status: DC
  Administered 2022-11-09: 5 mg/h via INTRAVENOUS
  Filled 2022-11-09: qty 125

## 2022-11-09 MED ORDER — ACETAMINOPHEN 325 MG PO TABS: 650.0000 mg | ORAL_TABLET | Freq: Four times a day (QID) | ORAL | Status: DC | PRN

## 2022-11-09 MED ORDER — ROSUVASTATIN CALCIUM 10 MG PO TABS
10.0000 mg | ORAL_TABLET | Freq: Every day | ORAL | Status: DC
  Administered 2022-11-09 – 2022-11-11 (×3): 10 mg via ORAL
  Filled 2022-11-09 (×3): qty 1

## 2022-11-09 MED ORDER — ALBUTEROL SULFATE (2.5 MG/3ML) 0.083% IN NEBU
3.0000 mL | INHALATION_SOLUTION | RESPIRATORY_TRACT | Status: DC | PRN
  Administered 2022-11-09 – 2022-11-11 (×2): 3 mL via RESPIRATORY_TRACT
  Filled 2022-11-09 (×2): qty 3

## 2022-11-09 MED ORDER — MORPHINE SULFATE (PF) 2 MG/ML IV SOLN
2.0000 mg | INTRAVENOUS | Status: DC | PRN
  Administered 2022-11-09: 2 mg via INTRAVENOUS
  Filled 2022-11-09: qty 1

## 2022-11-09 MED ORDER — METOPROLOL TARTRATE 25 MG PO TABS
25.0000 mg | ORAL_TABLET | Freq: Four times a day (QID) | ORAL | Status: DC
  Administered 2022-11-09 – 2022-11-12 (×11): 25 mg via ORAL
  Filled 2022-11-09 (×12): qty 1

## 2022-11-09 MED ORDER — ALUM & MAG HYDROXIDE-SIMETH 200-200-20 MG/5ML PO SUSP
30.0000 mL | Freq: Once | ORAL | Status: AC
  Administered 2022-11-09: 30 mL via ORAL
  Filled 2022-11-09: qty 30

## 2022-11-09 MED ORDER — OMEGA-3-ACID ETHYL ESTERS 1 G PO CAPS
1.0000 g | ORAL_CAPSULE | Freq: Every day | ORAL | Status: DC
  Administered 2022-11-09 – 2022-11-12 (×4): 1 g via ORAL
  Filled 2022-11-09 (×4): qty 1

## 2022-11-09 MED ORDER — ONDANSETRON HCL 4 MG/2ML IJ SOLN: 4.0000 mg | Freq: Three times a day (TID) | INTRAMUSCULAR | Status: AC | PRN

## 2022-11-09 NOTE — ED Notes (Signed)
Pt on call bell do to discomfort in bed. Pt placed in recliner chair next to bed.

## 2022-11-09 NOTE — ED Notes (Signed)
Md made aware of pts a-fib HR.

## 2022-11-09 NOTE — Progress Notes (Signed)
ANTICOAGULATION CONSULT NOTE  Pharmacy Consult for heparin infusion Indication: ACS/STEMI (Medication induced paroxysmal atrial fibrillation)  Allergies  Allergen Reactions   Wasp Venom Anaphylaxis   Chlorhexidine Gluconate     Itching under breasts   Metronidazole Other (See Comments)    Chest tightness, neck tightness   Prednisone Other (See Comments)    Severe migraine / with taper dose Told by Cardiologist to avoid due to Afib   Other Rash    Bleach   Sodium Hypochlorite Rash    Liquid clorox bleach    Patient Measurements: Height: 5\' 6"  (167.6 cm) Weight: 123.8 kg (273 lb) IBW/kg (Calculated) : 59.3 Heparin Dosing Weight: 89 kg  Vital Signs: Temp: 97.8 F (36.6 C) (05/31 1557) Temp Source: Oral (05/31 1557) BP: 119/76 (05/31 1830) Pulse Rate: 87 (05/31 1830)  Labs: Recent Labs    11/09/22 0220 11/09/22 0420 11/09/22 0541 11/09/22 1135 11/09/22 1506 11/09/22 1728  HGB 12.5  --   --   --   --   --   HCT 39.6  --   --   --   --   --   PLT 352  --   --   --   --   --   APTT  --   --  73*  --   --   --   LABPROT  --   --  14.2  --   --   --   INR  --   --  1.1  --   --   --   HEPARINUNFRC  --   --   --  0.43  --  0.26*  CREATININE 0.74  --   --   --   --   --   TROPONINIHS 6   < >  --  8 6 7    < > = values in this interval not displayed.     Estimated Creatinine Clearance: 91.7 mL/min (by C-G formula based on SCr of 0.74 mg/dL).   Medical History: Past Medical History:  Diagnosis Date   ALLERGIC RHINITIS 04/03/2007   Allergy    Not sure   Anemia    ((Pt Qnr Sub: Denies at visit from 08/28/2021 with endocrinology))    Anginal pain (HCC)    Anxiety    Arthritis Not sure   Asthma    hx of years ago - no longer a problem    ASTHMA, PERSISTENT, MODERATE 04/03/2007   Cancer (HCC)    skin    Dyspnea    with exertion   Dyspnea on exertion    a. 11/2007 Echo: EF 60%.   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 11/2011 48h Holter: RSR, rare PVC's, occas PAC's   Retinal tear of right eye 01/2016   Thyroid nodule 04/15/2018   Ventral hernia     Assessment: Pt  is a 67 yo female presenting to ED c/o palpitations and SOB.  5/31 1135 HL= 0.43  therapeutic x1 5/31 1728 HL= 0.26  subtherapeutic    Goal of Therapy:  Heparin level 0.3-0.7 units/ml Monitor platelets by anticoagulation protocol: Yes   Plan:  5/31 1728 HL= 0.26  subtherapeutic   Will order bolus 1300 units x1 Will increase heparin infusion to 1500 units/hr Will check HL in 6 hr  CBC daily while on heparin  Bari Mantis PharmD Clinical Pharmacist 11/09/2022

## 2022-11-09 NOTE — ED Notes (Signed)
Pt on call bell. Pt upset about being in the ER despite admit. RN explained to pt reason for delay and being in ER. Pt explained the same care / meds will be given as upstairs.

## 2022-11-09 NOTE — ED Provider Notes (Signed)
[facility] Provider Note    Event Date/Time   First MD Initiated Contact with Patient 11/09/22 0206     (approximate)   History   Chief Complaint: Chest Pain and Shortness of Breath   HPI  Kimberly Patton is a 67 y.o. female with a history of GERD, hypertension, paroxysmal atrial fibrillation triggered by steroids who comes ED complaining of palpitations and shortness of breath that woke her up from sleep.  Patient also reports frequent belching.  She took a as needed diltiazem tablet without relief.  Denies chest pain to me.  She was in her usual state of health.  Yesterday she did have 2 steroid injections in her wrist and elbow.  She reports that she has paroxysmal atrial fibrillation that is primarily triggered by prednisone use in the past and her cardiologist Dr. Myriam Forehand has not recommended anticoagulation.     Physical Exam   Triage Vital Signs: ED Triage Vitals  Enc Vitals Group     BP 11/09/22 0211 139/83     Pulse Rate 11/09/22 0204 (!) 146     Resp 11/09/22 0204 (!) 28     Temp 11/09/22 0211 (!) 97.5 F (36.4 C)     Temp Source 11/09/22 0211 Oral     SpO2 11/09/22 0204 90 %     Weight 11/09/22 0205 273 lb (123.8 kg)     Height 11/09/22 0205 5\' 6"  (1.676 m)     Head Circumference --      Peak Flow --      Pain Score 11/09/22 0205 9     Pain Loc --      Pain Edu? --      Excl. in GC? --     Most recent vital signs: Vitals:   11/09/22 0205 11/09/22 0211  BP:  139/83  Pulse:  (!) 110  Resp:  20  Temp:  (!) 97.5 F (36.4 C)  SpO2: 90% 100%    General: Awake, no distress.  CV:  Good peripheral perfusion.  Irregularly irregular rhythm, heart rate about 120 Resp:  Normal effort.  Clear to auscultation bilaterally Abd:  No distention.  Soft nontender Other:  Moist oral mucosa.  Thyroid nonpalpable   ED Results / Procedures / Treatments   Labs (all labs ordered are listed, but only abnormal results are displayed) Labs Reviewed   BASIC METABOLIC PANEL - Abnormal; Notable for the following components:      Result Value   BUN 33 (*)    All other components within normal limits  CBC WITH DIFFERENTIAL/PLATELET  APTT  PROTIME-INR  TROPONIN I (HIGH SENSITIVITY)  TROPONIN I (HIGH SENSITIVITY)     EKG Interpreted by me Atrial fibrillation rate of 114.  Normal axis, normal intervals.  Normal QRS ST segments and T waves.  Repeat EKG interpreted by me, shows atrial fibrillation, rate of 88.  No interval change.  No ischemic changes.   RADIOLOGY    PROCEDURES:  Procedures   MEDICATIONS ORDERED IN ED: Medications  famotidine (PEPCID) tablet 40 mg (0 mg Oral Hold 11/09/22 0414)  nitroGLYCERIN (NITROSTAT) SL tablet 0.4 mg (has no administration in time range)  heparin bolus via infusion 4,500 Units (has no administration in time range)  heparin ADULT infusion 100 units/mL (25000 units/263mL) (1,300 Units/hr Intravenous New Bag/Given 11/09/22 0536)  sodium chloride 0.9 % bolus 1,000 mL (1,000 mLs Intravenous New Bag/Given 11/09/22 0244)  diltiazem (CARDIZEM) injection 20 mg (20 mg Intravenous Given 11/09/22 0242)  magnesium sulfate IVPB 2 g 50 mL (0 g Intravenous Stopped 11/09/22 0346)  alum & mag hydroxide-simeth (MAALOX/MYLANTA) 200-200-20 MG/5ML suspension 30 mL (30 mLs Oral Given 11/09/22 0351)  diltiazem (CARDIZEM) injection 20 mg (20 mg Intravenous Given 11/09/22 0439)  aspirin chewable tablet 324 mg (324 mg Oral Given 11/09/22 0532)     IMPRESSION / MDM / ASSESSMENT AND PLAN / ED COURSE  I reviewed the triage vital signs and the nursing notes.  DDx: Paroxysmal atrial fibrillation, electrolyte abnormality, anemia, NSTEMI, GERD  Patient's presentation is most consistent with acute presentation with potential threat to life or bodily function.  Patient presents with shortness of breath in the setting of clinically apparent atrial fibrillation and tachycardia.  No chest pain or exertional symptoms.  Appears to  been provoked by recent steroid injections.  She also has symptoms consistent with GERD.  Will check labs, give an acids, IV fluids, diltiazem injection, IV magnesium.   Clinical Course as of 11/09/22 0537  Fri Nov 09, 2022  1610 After second dose of IV diltiazem, heart rate has improved to about 85, but patient reports ongoing chest pressure.  Will need to hospitalize for possible unstable angina.  Will give aspirin and sublingual nitroglycerin.  Initial troponin is normal. [PS]    Clinical Course User Index [PS] Sharman Cheek, MD     ----------------------------------------- 5:37 AM on 11/09/2022 ----------------------------------------- Case discussed with hospitalist for further management.  FINAL CLINICAL IMPRESSION(S) / ED DIAGNOSES   Final diagnoses:  Chest pain with moderate risk for cardiac etiology  PAF (paroxysmal atrial fibrillation) (HCC)     Rx / DC Orders   ED Discharge Orders     None        Note:  This document was prepared using Dragon voice recognition software and may include unintentional dictation errors.   Sharman Cheek, MD 11/09/22 [phone]

## 2022-11-09 NOTE — H&P (Signed)
History and Physical    Kimberly Patton:[id number] DOB: 05-Feb-1956 DOA: 11/09/2022  Referring MD/NP/PA:   PCP: Excell Seltzer, MD   Patient coming from:  The patient is coming from home.     Chief Complaint: Palpitation, chest pain, shortness of breath  HPI: Kimberly Patton is a 67 y.o. female with medical history significant of paroxysmal atrial fibrillation triggered by steroids (not on anticoagulants), HTN, HLD, asthma, CAD, morbid obesity, GERD, anxiety, lymphedema, who presents with palpitation, chest pain, shortness of breath.  Pt states that her symptoms started about 12:47 last night, including palpitation, heart racing, shortness of breath and chest pain.  No cough, fever or chills.  Her chest pain is located in the right side of chest initially, and then moving to and involving her middle chest, pressure-like, mild to moderate, nonradiating.  Patient has belching, no vomiting, diarrhea or abdominal pain.  Denies symptoms of UTI. She took diltiazem tablet without relief at home.  She states that has hx of paroxysmal atrial fibrillation that was primarily triggered by prednisone use in the past. Her cardiologist Dr. Azucena Cecil has not recommended anticoagulation. She states that she had different steroid injection to left elbow and left wrist on Tuesday.  Patient was found to have atrial fibrillation with RVR, heart rate up to 40s. IV fluid, 2 g magnesium sulfate and Cardizem IV were given with temporary improvement of heart rate to 70s, but then heart rate increased again up to 160s.  Cardizem drip was started.  Data reviewed independently and ED Course: pt was found to have trop 6 --> 6, WBC 8.2, GFR> 60, negative urinalysis, temperature normal, soft blood pressure, RR 28, oxygen saturation 100% on room air.  Patient is placed to PCU for observation.  Dr. Kirke Corin of cardiology is consulted.   EKG: I have personally reviewed.  Atrial fibrillation, QTc 450, heart rate 125, low  voltage, early R wave progression   Review of Systems:   General: no fevers, chills, no body weight gain, has fatigue HEENT: no blurry vision, hearing changes or sore throat Respiratory: has dyspnea, no coughing, wheezing CV: has chest pain, palpitations GI: no nausea, vomiting, abdominal pain, diarrhea, constipation GU: no dysuria, burning on urination, increased urinary frequency, hematuria  Ext: has trace leg edema Neuro: no unilateral weakness, numbness, or tingling, no vision change or hearing loss Skin: no rash, no skin tear. MSK: No muscle spasm, no deformity, no limitation of range of movement in spin Heme: No easy bruising.  Travel history: No recent long distant travel.   Allergy:  Allergies  Allergen Reactions   Wasp Venom Anaphylaxis   Chlorhexidine Gluconate     Itching under breasts   Metronidazole Other (See Comments)    Chest tightness, neck tightness   Prednisone Other (See Comments)    Severe migraine / with taper dose Told by Cardiologist to avoid due to Afib   Other Rash    Bleach   Sodium Hypochlorite Rash    Liquid clorox bleach    Past Medical History:  Diagnosis Date   ALLERGIC RHINITIS 04/03/2007   Allergy    Not sure   Anemia    ((Pt Qnr Sub: Denies at visit from 08/28/2021 with endocrinology))    Anginal pain (HCC)    Anxiety    Arthritis Not sure   Asthma    hx of years ago - no longer a problem    ASTHMA, PERSISTENT, MODERATE 04/03/2007   Cancer (HCC)    skin  Dyspnea    with exertion   Dyspnea on exertion    a. 11/2007 Echo: EF 60%.   Dysrhythmia    hx of heart arrhythmia 10-15 years ago - followed by DR Ladona Ridgel - not seen in years    GERD (gastroesophageal reflux disease)    Headache    Hemophilia carrier    History of kidney stones    Hypercholesteremia 06/09/2019   Hypertension    Joint pain    HIPS / LEGS   Knee injury    RT   Lymph edema    bilateral legs   Meningioma (HCC)    Morbid obesity (HCC)     Neuromuscular disorder (HCC) Not sure, since my fall   Occipital neuralgia    Paroxysmal SVT (supraventricular tachycardia)    a. 11/2011 48h Holter: RSR, rare PVC's, occas PAC's   Retinal tear of right eye 01/2016   Thyroid nodule 04/15/2018   Ventral hernia     Past Surgical History:  Procedure Laterality Date   ABDOMINAL HYSTERECTOMY  Think 1994   APPENDECTOMY  1996   ARTERY BIOPSY Left 03/21/2022   Procedure: BIOPSY TEMPORAL ARTERY;  Surgeon: Renford Dills, MD;  Location: [facility];  Service: Vascular;  Laterality: Left;   BIOPSY THYROID     08/2021   BREAST CYST EXCISION Right 1994   Benign   CESAREAN SECTION     x2   CHOLECYSTECTOMY  1995   COLONOSCOPY WITH PROPOFOL N/A 05/09/2021   Procedure: COLONOSCOPY WITH PROPOFOL;  Surgeon: Wyline Mood, MD;  Location: [facility] END[facility]COPY;  Service: Gastroenterology;  Laterality: N/A;   ESOPHAGOGASTRODUODENOSCOPY (EGD) WITH PROPOFOL N/A 11/02/2022   Procedure: ESOPHAGOGASTRODUODENOSCOPY (EGD) WITH PROPOFOL;  Surgeon: Regis Bill, MD;  Location: [facility] ENDOSCOPY;  Service: Endoscopy;  Laterality: N/A;   EYE SURGERY  01/2016   Repair retinal tear   FOOT SURGERY  1995 / 1996   x2   HERNIA REPAIR     KNEE ARTHROSCOPY W/ MENISCAL REPAIR  07/25/2013   right knee Dr. Dion Saucier   LAPAROSCOPIC GASTRIC SLEEVE RESECTION N/A 06/08/2013   Procedure: LAPAROSCOPIC GASTRIC SLEEVE RESECTION AND EXCISION OF SEBACEUS CYST FROM MID CHEST takedown of incarcerated ventral hernia and primary repair endoscopy;  Surgeon: Valarie Merino, MD;  Location: [facility];  Service: General;  Laterality: N/A;   LAPAROSCOPIC NISSEN FUNDOPLICATION N/A 02/02/2020   Procedure: LAPAROSCOPIC REPAIR LARGE SYMPTOMATIC HIATAL HERNIA WITH UPPER ENDOSCOPY;  Surgeon: Luretha Murphy, MD;  Location: [facility];  Service: General;  Laterality: N/A;   moles  06/2013   removed 2 moles from under arm and  lowback   NECK SURGERY     occipital nerve damage- injections    OVARIAN CYST REMOVAL   1970   PILONIDAL CYST EXCISION  1975   RADIOACTIVE SEED GUIDED EXCISIONAL BREAST BIOPSY Right 06/23/2021   Procedure: RADIOACTIVE SEED GUIDED EXCISIONAL RIGHT BREAST BIOPSY;  Surgeon: Luretha Murphy, MD;  Location: [facility];  Service: General;  Laterality: Right;   RETINAL TEAR REPAIR CRYOTHERAPY Right 01/2016   [place]   SKIN CANCER EXCISION     THYROID LOBECTOMY Left 08/21/2022   Procedure: LEFT THYROID LOBECTOMY;  Surgeon: Darnell Level, MD;  Location: [facility];  Service: General;  Laterality: Left;   THYROIDECTOMY, PARTIAL     TONSILLECTOMY     TUBAL LIGATION  Think 1994   UPPER GI ENDOSCOPY  06/08/2013   Procedure: UPPER GI ENDOSCOPY;  Surgeon: Valarie Merino, MD;  Location: [facility];  Service:  General;;   US ECHOCARDIOGRAPHY  12/2011   WNL - EF 55-60%, mild MR, grade 1 diastolic dysnfiction (mild)   VENTRAL HERNIA REPAIR  2000    Social History:  reports that she has never smoked. She has never used smokeless tobacco. She reports that she does not currently use alcohol. She reports that she does not use drugs.  Family History:  Family History  Problem Relation Age of Onset   Breast cancer Mother    Diabetes Mother    Hypertension Mother    Kidney failure Mother    Diabetes Father    Hypertension Father    Diabetes Other    Hypertension Other    Stroke Other    Hemophilia Other      Prior to Admission medications   Medication Sig Start Date End Date Taking? Authorizing Provider  albuterol (VENTOLIN HFA) 108 (90 Base) MCG/ACT inhaler Inhale 2 puffs into the lungs every 6 (six) hours as needed for wheezing or shortness of breath. 10/10/21  Yes Parrett, Tammy S, NP  Calcium Citrate-Vitamin D (CITRACAL + D PO) Take 2 tablets by mouth daily.    Yes [provider]  Coenzyme Q10-Vitamin E (QUNOL ULTRA COQ10) 100-150 MG-UNIT CAPS Take 100 mg by mouth daily.   Yes [provider]  Cyanocobalamin (VITAMIN B-12) 2500 MCG SUBL Place 2,500 mcg under  the tongue daily.   Yes [provider]  diltiazem (CARDIZEM) 30 MG tablet Take 30 mg by mouth 2 (two) times daily as needed.   Yes [provider]  famotidine (PEPCID) 40 MG tablet Take 40 mg by mouth at bedtime.   Yes [provider]  gabapentin (NEURONTIN) 100 MG capsule Take 100 mg by mouth daily. 08/23/22  Yes [provider]  Lactobacillus Rhamnosus, GG, (CULTURELLE IMMUNITY SUPPORT PO) Take 1 capsule by mouth daily.   Yes [provider]  Menaquinone-7 (VITAMIN K2 PO) Take 100 mg by mouth daily.   Yes [provider]  methocarbamol (ROBAXIN) 500 MG tablet Take 500 mg by mouth at bedtime as needed for muscle spasms. 06/14/22  Yes [provider]  montelukast (SINGULAIR) 10 MG tablet TAKE 1 TABLET BY MOUTH EVERYDAY AT BEDTIME 09/06/22  Yes Sood, Laurier Nancy, MD  Multiple Vitamins-Minerals (CENTRUM SILVER 50+WOMEN PO) Take 1 tablet by mouth daily.   Yes [provider]  nitroGLYCERIN (NITROSTAT) 0.4 MG SL tablet Place 1 tablet (0.4 mg total) under the tongue every 5 (five) minutes as needed for chest pain. 05/17/22  Yes Agbor-Etang, Arlys John, MD  Omega-3 Fatty Acids (FISH OIL) 1200 MG CAPS Take 1,200 mg by mouth daily.   Yes [provider]  pantoprazole (PROTONIX) 40 MG tablet TAKE 1 TABLET BY MOUTH EVERY DAY Patient taking differently: 40 mg at bedtime. 04/20/22  Yes Bedsole, Amy E, MD  Riboflavin 400 MG CAPS Take 400 mg by mouth daily. 07/26/21  Yes [provider]  rosuvastatin (CRESTOR) 10 MG tablet Take 1 tablet (10 mg total) by mouth daily. Patient taking differently: Take 10 mg by mouth at bedtime. 05/17/22  Yes Debbe Odea, MD  spironolactone (ALDACTONE) 25 MG tablet Take 1 tablet (25 mg total) by mouth daily. 08/13/22 08/08/23 Yes Agbor-Etang, Arlys John, MD  traMADol (ULTRAM) 50 MG tablet Take 1 tablet (50 mg total) by mouth every 6 (six) hours as needed. 08/22/22  Yes Darnell Level, MD  TURMERIC PO Take 2  tablets by mouth daily. 500 mg  With Ginger 50 mg Gummies   Yes [provider]  verapamil (CALAN-SR) 120 MG CR tablet Take 1 tablet (120 mg total) by mouth 2 (two) times daily. 09/24/22  Yes Furth, Cadence H, PA-C  VOQUEZNA 20 MG TABS Take 1 tablet by mouth daily with breakfast. 11/02/22  Yes [provider]  Zoledronic Acid (RECLAST IV) Inject into the vein. Yearly injection   Yes [provider]  azelastine (ASTELIN) 0.1 % nasal spray Place into both nostrils 2 (two) times daily. Use in each nostril as directed    [provider]  Cholecalciferol (VITAMIN D3) 1.25 MG (50000 UT) CAPS TAKE 1 CAPSULE (50,000 UNITS TOTAL) BY MOUTH ONCE A WEEK. TUESDAY 10/10/22   Bedsole, Amy E, MD  diltiazem (CARDIZEM CD) 120 MG 24 hr capsule Take 1 capsule (120 mg total) by mouth 2 (two) times daily. 09/10/22 12/09/22  Debbe Odea, MD  fluticasone-salmeterol (WIXELA INHUB) 250-50 MCG/ACT AEPB Inhale 1 puff into the lungs in the morning and at bedtime. Patient taking differently: Inhale 1 puff into the lungs daily. 07/16/22   Salena Saner, MD  hydrochlorothiazide (HYDRODIURIL) 25 MG tablet Take 25 mg by mouth daily.    [provider]    Physical Exam: Vitals:   11/09/22 1630 11/09/22 1700 11/09/22 1730 11/09/22 1830  BP: 100/79 101/77 109/78 119/76  Pulse: 75 (!) 57 68 87  Resp: 15 11 (!) 23 17  Temp:      TempSrc:      SpO2: 100% 100% 98% 99%  Weight:      Height:       General: Not in acute distress HEENT:       Eyes: PERRL, EOMI, no scleral icterus.       ENT: No discharge from the ears and nose, no pharynx injection, no tonsillar enlargement.        Neck: No JVD, no bruit, no mass felt. Heme: No neck lymph node enlargement. Cardiac: S1/S2, irregularly irregular rhythm, no murmurs, No gallops or rubs. Respiratory: No rales, wheezing, rhonchi or rubs. GI: Soft, nondistended, nontender, no rebound pain, no organomegaly, BS present. GU: No  hematuria Ext: Has trace leg edema bilaterally. 1+DP/PT pulse bilaterally. Musculoskeletal: No joint deformities, No joint redness or warmth, no limitation of ROM in spin. Skin: No rashes.  Neuro: Alert, oriented X3, cranial nerves II-XII grossly intact, moves all extremities normally.  Psych: Patient is not psychotic, no suicidal or hemocidal ideation.  Labs on Admission: I have personally reviewed following labs and imaging studies  CBC: Recent Labs  Lab 11/09/22 0220  WBC 8.2  NEUTROABS 4.8  HGB 12.5  HCT 39.6  MCV 82.8  PLT 352   Basic Metabolic Panel: Recent Labs  Lab 11/09/22 0220 11/09/22 1135  NA 136  --   K 3.6  --   CL 100  --   CO2 26  --   GLUCOSE 99  --   BUN 33*  --   CREATININE 0.74  --   CALCIUM 9.8  --   MG  --  2.7*   GFR: Estimated Creatinine Clearance: 91.7 mL/min (by C-G formula based on SCr of 0.74 mg/dL). Liver Function Tests: No results for input(s): "AST", "ALT", "ALKPHOS", "BILITOT", "PROT", "ALBUMIN" in the last 168 hours. No results for input(s): "LIPASE", "AMYLASE" in the last 168 hours. No results for input(s): "AMMONIA" in the last 168 hours. Coagulation Profile: Recent Labs  Lab 11/09/22 0541  INR 1.1   Cardiac Enzymes: No results for input(s): "CKTOTAL", "CKMB", "CKMBINDEX", "TROPONINI" in the last 168  hours. BNP (last 3 results) No results for input(s): "PROBNP" in the last 8760 hours. HbA1C: No results for input(s): "HGBA1C" in the last 72 hours. CBG: No results for input(s): "GLUCAP" in the last 168 hours. Lipid Profile: No results for input(s): "CHOL", "HDL", "LDLCALC", "TRIG", "CHOLHDL", "LDLDIRECT" in the last 72 hours. Thyroid Function Tests: Recent Labs    11/09/22 1135  TSH 2.732   Anemia Panel: No results for input(s): "VITAMINB12", "FOLATE", "FERRITIN", "TIBC", "IRON", "RETICCTPCT" in the last 72 hours. Urine analysis:    Component Value Date/Time   COLORURINE STRAW (A) 11/09/2022 1136   APPEARANCEUR  CLEAR (A) 11/09/2022 1136   LABSPEC 1.004 (L) 11/09/2022 1136   PHURINE 7.0 11/09/2022 1136   GLUCOSEU NEGATIVE 11/09/2022 1136   HGBUR NEGATIVE 11/09/2022 1136   BILIRUBINUR NEGATIVE 11/09/2022 1136   BILIRUBINUR Negative 08/17/2022 1544   KETONESUR NEGATIVE 11/09/2022 1136   PROTEINUR NEGATIVE 11/09/2022 1136   UROBILINOGEN 0.2 08/17/2022 1544   UROBILINOGEN 0.2 05/06/2010 1512   NITRITE NEGATIVE 11/09/2022 1136   LEUKOCYTESUR NEGATIVE 11/09/2022 1136   Sepsis Labs: @LABRCNTIP (procalcitonin:4,lacticidven:4) )No results found for this or any previous visit (from the past 240 hour(s)).   Radiological Exams on Admission: DG Chest Port 1 View  Result Date: 11/09/2022 CLINICAL DATA:  Acute onset chest pain. Shortness of breath. Palpitations. EXAM: PORTABLE CHEST 1 VIEW COMPARISON:  08/30/2022 FINDINGS: Atherosclerotic calcification of the aortic arch. Mild enlargement of the cardiopericardial silhouette. The lungs appear clear.  No blunting of the costophrenic angles. IMPRESSION: 1. Mild enlargement of the cardiopericardial silhouette, without edema. 2. Atherosclerotic calcification of the aortic arch. Electronically Signed   By: Gaylyn Rong M.D.   On: 11/09/2022 13:29      Assessment/Plan Principal Problem:   Paroxysmal atrial fibrillation with RVR (HCC) Active Problems:   CAD (coronary artery disease)   Chest pain   HTN (hypertension)   Hypercholesteremia   Asthma   Morbid obesity (HCC)   Assessment and Plan:  Paroxysmal atrial fibrillation with RVR (HCC): This is possibly triggered by recent steroid use.  Consulted with Dr. Kirke Corin of card.  CHADS2 score is 4, needs anticoagulants.  -place in PCU as inpt -start IV heparin -Oral Cardizem 30 mg twice daily -Metoprolol 25 mg every 6 hours -IV Cardizem drip started -2D echo   CAD (coronary artery disease) and Chest pain: Troponin 6 --> 6.  Likely has demand ischemia. -As needed nitroglycerin,  morphine -Crestor -Patient received 324 mg of aspirin -Follow-up 2D echo -Trend troponin -Patient is on IV heparin  HTN (hypertension) -Hold HCTZ, verapamil, spironolactone in order to use IV Cardizem drip -IV hydralazine as needed  Hypercholesteremia -Crestor  Asthma: Stable -Bronchodilators  Morbid obesity (HCC): Body weight 123.8 kg, BMI 44.06 -Encourage losing weight -Exercise and healthy diet    DVT ppx: on IV Heparin   Code Status: Full code  Family Communication: not done, no family member is at bed side.    Disposition Plan:  Anticipate discharge back to previous environment  Consults called:  Dr. Kirke Corin of cardiology is consulted.  Admission status and Level of care: Progressive:   for obs   Dispo: The patient is from: Home              Anticipated d/c is to: Home              Anticipated d/c date is: 1 day              Patient currently is not medically stable  to d/c.    Severity of Illness:  The appropriate patient status for this patient is OBSERVATION. Observation status is judged to be reasonable and necessary in order to provide the required intensity of service to ensure the patient's safety. The patient's presenting symptoms, physical exam findings, and initial radiographic and laboratory data in the context of their medical condition is felt to place them at decreased risk for further clinical deterioration. Furthermore, it is anticipated that the patient will be medically stable for discharge from the hospital within 2 midnights of admission.        Date of Service 11/09/2022    Lorretta Harp [facility]   If 7PM-7AM, please contact night-coverage [email or web address] 11/09/2022, 6:46 PM

## 2022-11-09 NOTE — ED Notes (Signed)
Pt on call bell, upset about being in ED still. Pt made aware of wait for upstairs.

## 2022-11-09 NOTE — Progress Notes (Signed)
ANTICOAGULATION CONSULT NOTE  Pharmacy Consult for heparin infusion Indication: ACS/STEMI (Medication induced paroxysmal atrial fibrillation)  Allergies  Allergen Reactions   Wasp Venom Anaphylaxis   Chlorhexidine Gluconate     Itching under breasts   Metronidazole Other (See Comments)    Chest tightness, neck tightness   Prednisone Other (See Comments)    Severe migraine / with taper dose Told by Cardiologist to avoid due to Afib   Other Rash    Bleach   Sodium Hypochlorite Rash    Liquid clorox bleach    Patient Measurements: Height: 5\' 6"  (167.6 cm) Weight: 123.8 kg (273 lb) IBW/kg (Calculated) : 59.3 Heparin Dosing Weight: 89 kg  Vital Signs: Temp: 97.5 F (36.4 C) (05/31 0211) Temp Source: Oral (05/31 0211) BP: 139/83 (05/31 0211) Pulse Rate: 110 (05/31 0211)  Labs: Recent Labs    11/09/22 0220  HGB 12.5  HCT 39.6  PLT 352  CREATININE 0.74  TROPONINIHS 6    Estimated Creatinine Clearance: 91.7 mL/min (by C-G formula based on SCr of 0.74 mg/dL).   Medical History: Past Medical History:  Diagnosis Date   ALLERGIC RHINITIS 04/03/2007   Allergy    Not sure   Anemia    ((Pt Qnr Sub: Denies at visit from 08/28/2021 with endocrinology))    Anginal pain (HCC)    Anxiety    Arthritis Not sure   Asthma    hx of years ago - no longer a problem    ASTHMA, PERSISTENT, MODERATE 04/03/2007   Cancer (HCC)    skin    Dyspnea    with exertion   Dyspnea on exertion    a. 11/2007 Echo: EF 60%.   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 11/2011 48h Holter: RSR, rare PVC's, occas PAC's   Retinal tear of right eye 01/2016   Thyroid nodule 04/15/2018   Ventral hernia     Assessment: Pt is a 67 yo female presenting to ED c/o palpitations and SOB.  Goal of Therapy:  Heparin level 0.3-0.7 units/ml Monitor platelets by anticoagulation protocol: Yes   Plan:  Bolus 4500 units x 1 Start heparin infusion at 1300 units/hr Will check HL in 6 hr after start of infusion CBC daily while on heparin  Otelia Sergeant, PharmD, [facility] 11/09/2022 5:02 AM

## 2022-11-09 NOTE — Consult Note (Signed)
Cardiology Consultation   Patient ID: Kimberly Patton MRN: [id number]; DOB: Feb 13, 1956  Admit date: 11/09/2022 Date of Consult: 11/09/2022  PCP:  Excell Seltzer, MD   [facility] Providers Cardiologist:  Debbe Odea, MD   {  Patient Profile:   Kimberly Patton is a 67 y.o. female with a hx of hypertension, hiatal hernia, GERD, mild nonobstructive CAD by cardiac CTA in 2023, iron deficiency anemia, SVT, history of lone A-fib in the context of steroid administration who is being seen 11/09/2022 for the evaluation of Afib RVR at the request of Dr. Clyde Lundborg.  History of Present Illness:   Kimberly Patton is followed by Dr. Azucena Cecil for the above cardiac issues.  Patient has a history of lone A-fib in the setting of steroid administration.  She has a history of hypokalemia while taking hydrochlorothiazide.  Losartan was started after stopping hydrochlorothiazide, however she developed nausea with losartan and this was stopped.  Lexiscan in August 2021 showed no evidence of ischemia.  Echocardiogram in August 2020 showed normal systolic and diastolic function with an EF of 60 to 65%.  Heart monitor November 2023 showed no A-fib with occasional PSVT.  BX CTA in 2023 showed minimal CAD.  Patient was last seen April 2024 reporting she was switched from verapamil to Cardizem for insurance reasons.  She reported chest pain with Cardizem, however low suspicion for ACS given nonobstructive CAD.  Cardizem was stopped and she was restarted on verapamil.  The patient presented to [facility] ED on 11/09/2022 with shortness of breath and elevated heart rate. Patient had a good day yesterday, in her normal state of health. she reported palpitations and shortness of breath that woke her up from sleep. Her watch noted a heart rate in the 180s. She also reported frequent belching and chest pressure. She took 2 diltiazem 30mg  without relief. She denies recent fever or chills. No LLE. She reported recent Cortizone  shots in both her arms.  In the ER blood pressure was 139/83, pulse rate 146 bpm, respiratory rate 28, afebrile.  Labs showed sodium 136, potassium 3.6, serum creatinine 0.74, BUN 33.  BBC 8.2, hemoglobin 12.5, platelets 352.  High-sensitivity troponin 6>6.  CXR non-acute. EKG showed A-fib with a heart rate of 125 bpm, nonspecific ST-T wave changes. Patient was given sublingual nitro, Pepcid, IV Cardizem 20 mg x 2, aspirin 324, started on IV heparin and mated for further workup.  Past Medical History:  Diagnosis Date   ALLERGIC RHINITIS 04/03/2007   Allergy    Not sure   Anemia    ((Pt Qnr Sub: Denies at visit from 08/28/2021 with endocrinology))    Anginal pain (HCC)    Anxiety    Arthritis Not sure   Asthma    hx of years ago - no longer a problem    ASTHMA, PERSISTENT, MODERATE 04/03/2007   Cancer (HCC)    skin    Dyspnea    with exertion   Dyspnea on exertion    a. 11/2007 Echo: EF 60%.   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 11/2011 48h Holter: RSR, rare PVC's, occas PAC's   Retinal tear of right eye 01/2016   Thyroid nodule 04/15/2018   Ventral hernia     Past Surgical History:  Procedure Laterality Date   ABDOMINAL HYSTERECTOMY  Think 1994   APPENDECTOMY  1996   ARTERY BIOPSY Left 03/21/2022   Procedure: BIOPSY TEMPORAL ARTERY;  Surgeon: Renford Dills, MD;  Location: [facility] ORS;  Service: Vascular;  Laterality: Left;   BIOPSY THYROID     08/2021   BREAST CYST EXCISION Right 1994   Benign    CESAREAN SECTION     x2   CHOLECYSTECTOMY  1995   COLONOSCOPY WITH PROPOFOL N/A 05/09/2021   Procedure: COLONOSCOPY WITH PROPOFOL;  Surgeon: Wyline Mood, MD;  Location: [facility] ENDOSCOPY;  Service: Gastroenterology;  Laterality: N/A;   ESOPHAGOGASTRODUODENOSCOPY (EGD) WITH PROPOFOL N/A 11/02/2022   Procedure: ESOPHAGOGASTRODUODENOSCOPY (EGD) WITH PROPOFOL;  Surgeon: Regis Bill, MD;  Location: [facility] ENDOSCOPY;  Service: Endoscopy;  Laterality: N/A;   EYE SURGERY  01/2016   Repair retinal tear   FOOT SURGERY  1995 / 1996   x2   HERNIA REPAIR     KNEE ARTHROSCOPY W/ MENISCAL REPAIR  07/25/2013   right knee Dr. Dion Saucier   LAPAROSCOPIC GASTRIC SLEEVE RESECTION N/A 06/08/2013   Procedure: LAPAROSCOPIC GASTRIC SLEEVE RESECTION AND EXCISION OF SEBACEUS CYST FROM MID CHEST takedown of incarcerated ventral hernia and primary repair endoscopy;  Surgeon: Valarie Merino, MD;  Location: [facility] ORS;  Service: General;  Laterality: N/A;   LAPAROSCOPIC NISSEN FUNDOPLICATION N/A 02/02/2020   Procedure: LAPAROSCOPIC REPAIR LARGE SYMPTOMATIC HIATAL HERNIA WITH UPPER ENDOSCOPY;  Surgeon: Luretha Murphy, MD;  Location: [facility] ORS;  Service: General;  Laterality: N/A;   moles  06/2013   removed 2 moles from under arm and  lowback   NECK SURGERY     occipital nerve damage- injections    OVARIAN CYST REMOVAL  1970   PILONIDAL CYST EXCISION  1975   RADIOACTIVE SEED GUIDED EXCISIONAL BREAST BIOPSY Right 06/23/2021   Procedure: RADIOACTIVE SEED GUIDED EXCISIONAL RIGHT BREAST BIOPSY;  Surgeon: Luretha Murphy, MD;  Location: [facility];  Service: General;  Laterality: Right;   RETINAL TEAR REPAIR CRYOTHERAPY Right 01/2016   [place]   SKIN CANCER EXCISION     THYROID LOBECTOMY Left 08/21/2022   Procedure: LEFT THYROID LOBECTOMY;  Surgeon: Darnell Level, MD;  Location: [facility] OR[facility];  Service: General;  Laterality: Left;   THYROIDECTOMY, PARTIAL     TONSILLECTOMY     TUBAL LIGATION  Think 1994   UPPER GI  ENDOSCOPY  06/08/2013   Procedure: UPPER GI ENDOSCOPY;  Surgeon: Valarie Merino, MD;  Location: [facility];  Service: General;;   US ECHOCARDIOGRAPHY  12/2011   WNL - EF 55-60%, mild MR, grade 1 diastolic dysnfiction (mild)   VENTRAL HERNIA REPAIR  2000     Home Medications:  Prior to Admission medications   Medication Sig Start Date End Date Taking? Authorizing Provider  albuterol (VENTOLIN HFA) 108 (90 Base) MCG/ACT inhaler Inhale 2 puffs into the lungs every 6 (six) hours as needed for wheezing or shortness of breath. 10/10/21  Yes Parrett, Tammy S, NP  Calcium Citrate-Vitamin D (CITRACAL + D PO) Take 2 tablets by mouth daily.  Yes [provider]  Coenzyme Q10-Vitamin E (QUNOL ULTRA COQ10) 100-150 MG-UNIT CAPS Take 100 mg by mouth daily.   Yes [provider]  Cyanocobalamin (VITAMIN B-12) 2500 MCG SUBL Place 2,500 mcg under the tongue daily.   Yes [provider]  diltiazem (CARDIZEM) 30 MG tablet Take 30 mg by mouth 2 (two) times daily as needed.   Yes [provider]  famotidine (PEPCID) 40 MG tablet Take 40 mg by mouth at bedtime.   Yes [provider]  gabapentin (NEURONTIN) 100 MG capsule Take 100 mg by mouth daily. 08/23/22  Yes [provider]  Lactobacillus Rhamnosus, GG, (CULTURELLE IMMUNITY SUPPORT PO) Take 1 capsule by mouth daily.   Yes [provider]  Menaquinone-7 (VITAMIN K2 PO) Take 100 mg by mouth daily.   Yes [provider]  methocarbamol (ROBAXIN) 500 MG tablet Take 500 mg by mouth at bedtime as needed for muscle spasms. 06/14/22  Yes [provider]  montelukast (SINGULAIR) 10 MG tablet TAKE 1 TABLET BY MOUTH EVERYDAY AT BEDTIME 09/06/22  Yes Sood, Laurier Nancy, MD  Multiple Vitamins-Minerals (CENTRUM SILVER 50+WOMEN PO) Take 1 tablet by mouth daily.   Yes [provider]  nitroGLYCERIN (NITROSTAT) 0.4 MG SL tablet Place 1 tablet (0.4 mg total) under the tongue every 5 (five) minutes as  needed for chest pain. 05/17/22  Yes Agbor-Etang, Arlys John, MD  Omega-3 Fatty Acids (FISH OIL) 1200 MG CAPS Take 1,200 mg by mouth daily.   Yes [provider]  pantoprazole (PROTONIX) 40 MG tablet TAKE 1 TABLET BY MOUTH EVERY DAY Patient taking differently: 40 mg at bedtime. 04/20/22  Yes Bedsole, Amy E, MD  Riboflavin 400 MG CAPS Take 400 mg by mouth daily. 07/26/21  Yes [provider]  rosuvastatin (CRESTOR) 10 MG tablet Take 1 tablet (10 mg total) by mouth daily. Patient taking differently: Take 10 mg by mouth at bedtime. 05/17/22  Yes Debbe Odea, MD  spironolactone (ALDACTONE) 25 MG tablet Take 1 tablet (25 mg total) by mouth daily. 08/13/22 08/08/23 Yes Agbor-Etang, Arlys John, MD  traMADol (ULTRAM) 50 MG tablet Take 1 tablet (50 mg total) by mouth every 6 (six) hours as needed. 08/22/22  Yes Darnell Level, MD  TURMERIC PO Take 2 tablets by mouth daily. 500 mg  With Ginger 50 mg Gummies   Yes [provider]  verapamil (CALAN-SR) 120 MG CR tablet Take 1 tablet (120 mg total) by mouth 2 (two) times daily. 09/24/22  Yes Ezequiel Macauley H, PA-C  VOQUEZNA 20 MG TABS Take 1 tablet by mouth daily with breakfast. 11/02/22  Yes [provider]  Zoledronic Acid (RECLAST IV) Inject into the vein. Yearly injection   Yes [provider]  azelastine (ASTELIN) 0.1 % nasal spray Place into both nostrils 2 (two) times daily. Use in each nostril as directed    [provider]  Cholecalciferol (VITAMIN D3) 1.25 MG (50000 UT) CAPS TAKE 1 CAPSULE (50,000 UNITS TOTAL) BY MOUTH ONCE A WEEK. TUESDAY 10/10/22   Bedsole, Amy E, MD  diltiazem (CARDIZEM CD) 120 MG 24 hr capsule Take 1 capsule (120 mg total) by mouth 2 (two) times daily. 09/10/22 12/09/22  Debbe Odea, MD  fluticasone-salmeterol (WIXELA INHUB) 250-50 MCG/ACT AEPB Inhale 1 puff into the lungs in the morning and at bedtime. Patient taking differently: Inhale 1 puff into the lungs daily. 07/16/22   Salena Saner, MD  hydrochlorothiazide (HYDRODIURIL) 25 MG tablet Take 25 mg by mouth daily.  [provider]    Inpatient Medications: Scheduled Meds:  famotidine  40 mg Oral Once   Continuous Infusions:  heparin 1,300 Units/hr (11/09/22 0536)   PRN Meds: acetaminophen, albuterol, dextromethorphan-guaiFENesin, hydrALAZINE, HYDROmorphone (DILAUDID) injection, morphine injection, nitroGLYCERIN, ondansetron (ZOFRAN) IV  Allergies:    Allergies  Allergen Reactions   Wasp Venom Anaphylaxis   Chlorhexidine Gluconate     Itching under breasts   Metronidazole Other (See Comments)    Chest tightness, neck tightness   Prednisone Other (See Comments)    Severe migraine / with taper dose Told by Cardiologist to avoid due to Afib   Other Rash    Bleach   Sodium Hypochlorite Rash    Liquid clorox bleach    Social History:   Social History   Socioeconomic History   Marital status: Divorced    Spouse name: Not on file   Number of children: 2   Years of education: 17   Highest education level: Not on file  Occupational History   Occupation: Magazine features editor: [employer]  Tobacco Use   Smoking status: Never   Smokeless tobacco: Never   Tobacco comments:    smoke at age 60-12  Vaping Use   Vaping Use: Never used  Substance and Sexual Activity   Alcohol use: Not Currently   Drug use: No   Sexual activity: Not Currently    Partners: Male    Birth control/protection: Abstinence, Pill  Other Topics Concern   Not on file  Social History Narrative   Lives at home alone.   Right-handed.   Occasional caffeine use.   Social Determinants of Health   Financial Resource Strain: Low Risk  (05/08/2022)   Overall Financial Resource Strain (CARDIA)    Difficulty of Paying Living Expenses: Not hard at all  Food Insecurity: No Food Insecurity (08/21/2022)   Hunger Vital Sign    Worried About Running Out of Food in the Last Year: Never true    Ran Out of Food in  the Last Year: Never true  Transportation Needs: No Transportation Needs (08/21/2022)   PRAPARE - Administrator, Civil Service (Medical): No    Lack of Transportation (Non-Medical): No  Physical Activity: Inactive (05/08/2022)   Exercise Vital Sign    Days of Exercise per Week: 0 days    Minutes of Exercise per Session: 0 min  Stress: No Stress Concern Present (05/08/2022)   [employer] of Occupational Health - Occupational Stress Questionnaire    Feeling of Stress : Not at all  Social Connections: Moderately Integrated (05/08/2022)   Social Connection and Isolation Panel [NHANES]    Frequency of Communication with Friends and Family: More than three times a week    Frequency of Social Gatherings with Friends and Family: More than three times a week    Attends Religious Services: More than 4 times per year    Active Member of [employer] or Organizations: Yes    Attends Banker Meetings: More than 4 times per year    Marital Status: Divorced  Intimate Partner Violence: Not At Risk (08/21/2022)   Humiliation, Afraid, Rape, and Kick questionnaire    Fear of Current or Ex-Partner: No    Emotionally Abused: No    Physically Abused: No    Sexually Abused: No    Family History:    Family History  Problem Relation Age of Onset   Breast cancer Mother    Diabetes Mother  Hypertension Mother    Kidney failure Mother    Diabetes Father    Hypertension Father    Diabetes Other    Hypertension Other    Stroke Other    Hemophilia Other      ROS:  Please see the history of present illness.   All other ROS reviewed and negative.     Physical Exam/Data:   Vitals:   11/09/22 0430 11/09/22 0500 11/09/22 0530 11/09/22 0609  BP: 102/72 (!) 125/59 102/79   Pulse: 63 (!) 106 78   Resp: 16 19 13    Temp:    97.9 F (36.6 C)  TempSrc:    Oral  SpO2: 100% 100% 100%   Weight:      Height:        Intake/Output Summary (Last 24 hours) at 11/09/2022 0900 Last  data filed at 11/09/2022 0700 Gross per 24 hour  Intake 1046.37 ml  Output --  Net 1046.37 ml      11/09/2022    2:05 AM 11/02/2022   11:51 AM 09/24/2022    1:40 PM  Last 3 Weights  Weight (lbs) 273 lb 277 lb 4.4 oz 277 lb  Weight (kg) 123.832 kg 125.77 kg 125.646 kg     Body mass index is 44.06 kg/m.  General:  Well nourished, well developed, in no acute distress HEENT: normal Neck: no JVD Vascular: No carotid bruits; Distal pulses 2+ bilaterally Cardiac:  normal S1, S2; Irreg Irreg; no murmur  Lungs:  clear to auscultation bilaterally, no wheezing, rhonchi or rales  Abd: soft, nontender, no hepatomegaly  Ext: no edema Musculoskeletal:  No deformities, BUE and BLE strength normal and equal Skin: warm and dry  Neuro:  CNs 2-12 intact, no focal abnormalities noted Psych:  Normal affect   EKG:  The EKG was personally reviewed and demonstrates:  Afib 125bpm, nonspecific ST/T wave changes Telemetry:  Telemetry was personally reviewed and demonstrates:  Afib HR around 100  Relevant CV Studies:  Heart monitor 04/2022 Patch Wear Time:  12 days and 22 hours (2023-10-24T08:08:41-0400 to 2023-11-06T05:44:58-0500)   Patient had a min HR of 44 bpm, max HR of 197 bpm, and avg HR of 74 bpm. Predominant underlying rhythm was Sinus Rhythm. 13 Supraventricular Tachycardia runs occurred, the run with the fastest interval lasting 5 beats with a max rate of 197 bpm, the  longest lasting 14 beats with an avg rate of 155 bpm. Some episodes of Supraventricular Tachycardia may be possible Atrial Tachycardia with variable block. Isolated SVEs were rare (<1.0%), SVE Couplets were rare (<1.0%), and SVE Triplets were rare  (<1.0%). Isolated VEs were rare (<1.0%, 292), VE Couplets were rare (<1.0%, 8), and VE Triplets were rare (<1.0%, 1).   Conclusion Paroxysmal SVT, no evidence of A-fib noted Continue meds as prescribed  Echo 09/2021 1. Left ventricular ejection fraction, by estimation, is 55 to 60%.  The  left ventricle has normal function. The left ventricle has no regional  wall motion abnormalities. Left ventricular diastolic parameters were  normal. The average left ventricular  global longitudinal strain is -20.5 %. The global longitudinal strain is  normal.   2. Right ventricular systolic function is normal. The right ventricular  size is normal.   3. The mitral valve is normal in structure. Trivial mitral valve  regurgitation.   4. The aortic valve is tricuspid. Aortic valve regurgitation is not  visualized.   5. The inferior vena cava is normal in size with greater than 50%  respiratory  variability, suggesting right atrial pressure of 3 mmHg.   Cardiac CT 09/2021   IMPRESSION: 1. Coronary calcium score of 59.8. This was 76th percentile for age and sex matched control.   2. Normal coronary origin with right dominance.   3. Calcified plaque causing minimal stenosis in the proximal LAD, RCA, LCx arteries.   4. CAD-RADS 1. Minimal non-obstructive CAD (0-24%). Consider non-atherosclerotic causes of chest pain. Consider preventive therapy and risk factor modification.   Laboratory Data:  High Sensitivity Troponin:   Recent Labs  Lab 11/09/22 0220 11/09/22 0420  TROPONINIHS 6 6     Chemistry Recent Labs  Lab 11/09/22 0220  NA 136  K 3.6  CL 100  CO2 26  GLUCOSE 99  BUN 33*  CREATININE 0.74  CALCIUM 9.8  GFRNONAA >60  ANIONGAP 10    No results for input(s): "PROT", "ALBUMIN", "AST", "ALT", "ALKPHOS", "BILITOT" in the last 168 hours. Lipids No results for input(s): "CHOL", "TRIG", "HDL", "LABVLDL", "LDLCALC", "CHOLHDL" in the last 168 hours.  Hematology Recent Labs  Lab 11/09/22 0220  WBC 8.2  RBC 4.78  HGB 12.5  HCT 39.6  MCV 82.8  MCH 26.2  MCHC 31.6  RDW 15.2  PLT 352   Thyroid No results for input(s): "TSH", "FREET4" in the last 168 hours.  BNPNo results for input(s): "BNP", "PROBNP" in the last 168 hours.  DDimer No results for input(s):  "DDIMER" in the last 168 hours.   Radiology/Studies:  No results found.   Assessment and Plan:   Paroxysmal Afib - she has a h/o lone afib episode in the setting of steroid shot not on a/c - presented with sudden onset shortness of breath, palpitations, and chest pain found to be in rapid afib. She reported recent Cortizone shots in both arms.  - she was given IV dilt 20mg  x 2 and dilt 30mg  in the ER - started on IV heparin - CHADSVASC at least 3 (HTN, age, female) - check an echo - PTA dilt 120mg  BID - still in afib with rates 80-100. She feels she can't take a deep breath. - I will start metoprolol 24mg  Q6H. If she doesn't self-convert, may need TEE/DCCV  Chest pain Nonobstructive CAD - chest pain in the setting of rapid afib - s/p ASA and SL NTG, on IV heparin - cardiac CTA in 2023 showed nonobstructive CAD - HS trop negative x 2 - no further ischemic work-up  HTN - Bps soft at times  HLD - LDL 61 - continue Crestor 10mg  daily  For questions or updates, please contact [facility] Please consult [email or web address] for contact info under    Signed, Aslynn Brunetti David Stall, PA-C  11/09/2022 9:00 AM

## 2022-11-09 NOTE — ED Notes (Signed)
Hospital bed requested for pt. 

## 2022-11-09 NOTE — ED Notes (Signed)
Charge RN speaking with pt about her concerns for being downstairs in ED.

## 2022-11-09 NOTE — ED Notes (Signed)
Pt endorsing right sided chest pressure and abd discomfort following diltiazem administration. Provider notified

## 2022-11-09 NOTE — Progress Notes (Signed)
ANTICOAGULATION CONSULT NOTE  Pharmacy Consult for heparin infusion Indication: ACS/STEMI (Medication induced paroxysmal atrial fibrillation)  Allergies  Allergen Reactions   Wasp Venom Anaphylaxis   Chlorhexidine Gluconate     Itching under breasts   Metronidazole Other (See Comments)    Chest tightness, neck tightness   Prednisone Other (See Comments)    Severe migraine / with taper dose Told by Cardiologist to avoid due to Afib   Other Rash    Bleach   Sodium Hypochlorite Rash    Liquid clorox bleach    Patient Measurements: Height: 5\' 6"  (167.6 cm) Weight: 123.8 kg (273 lb) IBW/kg (Calculated) : 59.3 Heparin Dosing Weight: 89 kg  Vital Signs: Temp: 98 F (36.7 C) (05/31 1143) Temp Source: Oral (05/31 1143) BP: 109/88 (05/31 1030) Pulse Rate: 98 (05/31 1030)  Labs: Recent Labs    11/09/22 0220 11/09/22 0420 11/09/22 0541 11/09/22 1135  HGB 12.5  --   --   --   HCT 39.6  --   --   --   PLT 352  --   --   --   APTT  --   --  73*  --   LABPROT  --   --  14.2  --   INR  --   --  1.1  --   HEPARINUNFRC  --   --   --  0.43  CREATININE 0.74  --   --   --   TROPONINIHS 6 6  --   --      Estimated Creatinine Clearance: 91.7 mL/min (by C-G formula based on SCr of 0.74 mg/dL).   Medical History: Past Medical History:  Diagnosis Date   ALLERGIC RHINITIS 04/03/2007   Allergy    Not sure   Anemia    ((Pt Qnr Sub: Denies at visit from 08/28/2021 with endocrinology))    Anginal pain (HCC)    Anxiety    Arthritis Not sure   Asthma    hx of years ago - no longer a problem    ASTHMA, PERSISTENT, MODERATE 04/03/2007   Cancer (HCC)    skin    Dyspnea    with exertion   Dyspnea on exertion    a. 11/2007 Echo: EF 60%.   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 11/2011 48h Holter: RSR, rare PVC's, occas PAC's   Retinal tear of right eye 01/2016   Thyroid nodule 04/15/2018   Ventral hernia     Assessment: Pt is a 67 yo female presenting to ED c/o palpitations and SOB.  5/31 1135 HL= 0.43  therapeutic x1  Goal of Therapy:  Heparin level 0.3-0.7 units/ml Monitor platelets by anticoagulation protocol: Yes   Plan:  5/31 1135 HL= 0.43  therapeutic x1 Continue heparin infusion at 1300 units/hr Will check confirmatory HL in 6 hr  CBC daily while on  heparin  Bari Mantis PharmD Clinical Pharmacist 11/09/2022

## 2022-11-09 NOTE — ED Triage Notes (Signed)
Patient took 30 mg diltiazem 15 min pta

## 2022-11-09 NOTE — ED Triage Notes (Signed)
Acute onset of chest pain, palpitations, SOB that woke pt from sleep. Reports persistent belching as well. Pt alert and oriented. Breathing rapid and labored speaking in short sentences.

## 2022-11-09 NOTE — ED Notes (Signed)
Pt readjusted in bed.

## 2022-11-10 ENCOUNTER — Observation Stay (HOSPITAL_COMMUNITY)
Admit: 2022-11-10 | Discharge: 2022-11-10 | Disposition: A | Discharge status: Home / Self Care | Payer: HMO | Attending: Medical | Admitting: Medical

## 2022-11-10 ENCOUNTER — Other Ambulatory Visit: Payer: Self-pay

## 2022-11-10 DIAGNOSIS — Z85828 Personal history of other malignant neoplasm of skin: Secondary | ICD-10-CM | POA: Diagnosis not present

## 2022-11-10 DIAGNOSIS — K219 Gastro-esophageal reflux disease without esophagitis: Secondary | ICD-10-CM | POA: Diagnosis not present

## 2022-11-10 DIAGNOSIS — Z9049 Acquired absence of other specified parts of digestive tract: Secondary | ICD-10-CM | POA: Diagnosis not present

## 2022-11-10 DIAGNOSIS — Z90710 Acquired absence of both cervix and uterus: Secondary | ICD-10-CM | POA: Diagnosis not present

## 2022-11-10 DIAGNOSIS — Z841 Family history of disorders of kidney and ureter: Secondary | ICD-10-CM | POA: Diagnosis not present

## 2022-11-10 DIAGNOSIS — Z8249 Family history of ischemic heart disease and other diseases of the circulatory system: Secondary | ICD-10-CM | POA: Diagnosis not present

## 2022-11-10 DIAGNOSIS — J45909 Unspecified asthma, uncomplicated: Secondary | ICD-10-CM | POA: Diagnosis not present

## 2022-11-10 DIAGNOSIS — Z832 Family history of diseases of the blood and blood-forming organs and certain disorders involving the immune mechanism: Secondary | ICD-10-CM | POA: Diagnosis not present

## 2022-11-10 DIAGNOSIS — I4891 Unspecified atrial fibrillation: Secondary | ICD-10-CM | POA: Diagnosis not present

## 2022-11-10 DIAGNOSIS — I2510 Atherosclerotic heart disease of native coronary artery without angina pectoris: Secondary | ICD-10-CM | POA: Diagnosis not present

## 2022-11-10 DIAGNOSIS — Z86011 Personal history of benign neoplasm of the brain: Secondary | ICD-10-CM | POA: Diagnosis not present

## 2022-11-10 DIAGNOSIS — Z888 Allergy status to other drugs, medicaments and biological substances status: Secondary | ICD-10-CM | POA: Diagnosis not present

## 2022-11-10 DIAGNOSIS — F419 Anxiety disorder, unspecified: Secondary | ICD-10-CM | POA: Diagnosis not present

## 2022-11-10 DIAGNOSIS — R079 Chest pain, unspecified: Secondary | ICD-10-CM | POA: Diagnosis not present

## 2022-11-10 DIAGNOSIS — Z883 Allergy status to other anti-infective agents status: Secondary | ICD-10-CM | POA: Diagnosis not present

## 2022-11-10 DIAGNOSIS — Z803 Family history of malignant neoplasm of breast: Secondary | ICD-10-CM | POA: Diagnosis not present

## 2022-11-10 DIAGNOSIS — E7800 Pure hypercholesterolemia, unspecified: Secondary | ICD-10-CM | POA: Diagnosis not present

## 2022-11-10 DIAGNOSIS — I480 Paroxysmal atrial fibrillation: Secondary | ICD-10-CM | POA: Diagnosis not present

## 2022-11-10 DIAGNOSIS — Z823 Family history of stroke: Secondary | ICD-10-CM | POA: Diagnosis not present

## 2022-11-10 DIAGNOSIS — G8918 Other acute postprocedural pain: Secondary | ICD-10-CM | POA: Diagnosis not present

## 2022-11-10 DIAGNOSIS — R0789 Other chest pain: Secondary | ICD-10-CM | POA: Diagnosis not present

## 2022-11-10 DIAGNOSIS — Z6841 Body Mass Index (BMI) 40.0 and over, adult: Secondary | ICD-10-CM | POA: Diagnosis not present

## 2022-11-10 DIAGNOSIS — Z91030 Bee allergy status: Secondary | ICD-10-CM | POA: Diagnosis not present

## 2022-11-10 DIAGNOSIS — Z833 Family history of diabetes mellitus: Secondary | ICD-10-CM | POA: Diagnosis not present

## 2022-11-10 DIAGNOSIS — Z539 Procedure and treatment not carried out, unspecified reason: Secondary | ICD-10-CM | POA: Diagnosis not present

## 2022-11-10 DIAGNOSIS — Z79899 Other long term (current) drug therapy: Secondary | ICD-10-CM | POA: Diagnosis not present

## 2022-11-10 DIAGNOSIS — I959 Hypotension, unspecified: Secondary | ICD-10-CM | POA: Diagnosis not present

## 2022-11-10 DIAGNOSIS — I10 Essential (primary) hypertension: Secondary | ICD-10-CM | POA: Diagnosis not present

## 2022-11-10 LAB — HEPARIN LEVEL (UNFRACTIONATED)
Heparin Unfractionated: 0.6 IU/mL (ref 0.30–0.70)
Heparin Unfractionated: 0.54 IU/mL (ref 0.30–0.70)

## 2022-11-10 LAB — BASIC METABOLIC PANEL
Anion gap: 8 (ref 5–15)
BUN: 19 mg/dL (ref 8–23)
CO2: 26 mmol/L (ref 22–32)
Calcium: 8.8 mg/dL — ABNORMAL LOW (ref 8.9–10.3)
Chloride: 105 mmol/L (ref 98–111)
Creatinine, Ser: 0.54 mg/dL (ref 0.44–1.00)
GFR, Estimated: >60 mL/min (ref >60)
Glucose, Bld: 93 mg/dL (ref 70–99)
Potassium: 4 mmol/L (ref 3.5–5.1)
Sodium: 139 mmol/L (ref 135–145)

## 2022-11-10 LAB — CBC
HCT: 36.5 % (ref 36.0–46.0)
Hemoglobin: 11.5 g/dL — ABNORMAL LOW (ref 12.0–15.0)
MCH: 26.3 pg (ref 26.0–34.0)
MCHC: 31.5 g/dL (ref 30.0–36.0)
MCV: 83.3 fL (ref 80.0–100.0)
Platelets: 302 10*3/uL (ref 150–400)
RBC: 4.38 MIL/uL (ref 3.87–5.11)
RDW: 15.3 % (ref 11.5–15.5)
WBC: 8.3 10*3/uL (ref 4.0–10.5)
nRBC: 0 % (ref 0.0–0.2)

## 2022-11-10 LAB — LIPID PANEL
Cholesterol: 142 mg/dL (ref 0–200)
HDL: 60 mg/dL (ref >40)
LDL Cholesterol: 56 mg/dL (ref 0–99)
Total CHOL/HDL Ratio: 2.4 RATIO
Triglycerides: 132 mg/dL (ref <150)
VLDL: 26 mg/dL (ref 0–40)

## 2022-11-10 LAB — HEMOGLOBIN A1C
Hgb A1c MFr Bld: 5.8 % — ABNORMAL HIGH (ref 4.8–5.6)
Mean Plasma Glucose: 120 mg/dL

## 2022-11-10 MED ORDER — ORAL CARE MOUTH RINSE: 15.0000 mL | OROMUCOSAL | Status: DC | PRN

## 2022-11-10 NOTE — ED Notes (Signed)
Stopped diltiazem due to HR consistently below 60

## 2022-11-10 NOTE — Progress Notes (Signed)
ANTICOAGULATION CONSULT NOTE  Pharmacy Consult for heparin infusion Indication: ACS/STEMI (Medication induced paroxysmal atrial fibrillation)  Allergies  Allergen Reactions   Wasp Venom Anaphylaxis   Chlorhexidine Gluconate     Itching under breasts   Metronidazole Other (See Comments)    Chest tightness, neck tightness   Prednisone Other (See Comments)    Severe migraine / with taper dose Told by Cardiologist to avoid due to Afib   Other Rash    Bleach   Sodium Hypochlorite Rash    Liquid clorox bleach    Patient Measurements: Height: 5\' 6"  (167.6 cm) Weight: 123.8 kg (273 lb) IBW/kg (Calculated) : 59.3 Heparin Dosing Weight: 89 kg  Vital Signs: Temp: 98.2 F (36.8 C) (06/01 1119) Temp Source: Oral (06/01 1119) BP: 96/77 (06/01 1119) Pulse Rate: 109 (06/01 1119)  Labs: Recent Labs    11/09/22 0220 11/09/22 0420 11/09/22 0541 11/09/22 1135 11/09/22 1506 11/09/22 1728 11/10/22 0350 11/10/22 1309  HGB 12.5  --   --   --   --   --  11.5*  --   HCT 39.6  --   --   --   --   --  36.5  --   PLT 352  --   --   --   --   --  302  --   APTT  --   --  73*  --   --   --   --   --   LABPROT  --   --  14.2  --   --   --   --   --   INR  --   --  1.1  --   --   --   --   --   HEPARINUNFRC  --    < >  --  0.43  --  0.26* 0.60 0.54  CREATININE 0.74  --   --   --   --   --  0.54  --   TROPONINIHS 6   < >  --  8 6 7   --   --    < > = values in this interval not displayed.     Estimated Creatinine Clearance: 91.7 mL/min (by C-G formula based on SCr of 0.54 mg/dL).   Medical History: Past Medical History:  Diagnosis Date   ALLERGIC RHINITIS 04/03/2007   Allergy    Not sure   Anemia    ((Pt Qnr Sub: Denies at visit from 08/28/2021 with endocrinology))    Anginal pain (HCC)    Anxiety    Arthritis Not sure   Asthma    hx of years ago - no longer a problem    ASTHMA, PERSISTENT, MODERATE 04/03/2007   Cancer (HCC)    skin    Dyspnea    with exertion   Dyspnea on  exertion    a. 11/2007 Echo: EF 60%.   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 11/2011 48h Holter: RSR, rare PVC's, occas PAC's   Retinal tear of right eye 01/2016   Thyroid nodule 04/15/2018   Ventral hernia     Assessment: Pt is a 67 yo female presenting to ED c/o palpitations and SOB.  5/31 1135 HL= 0.43  therapeutic x1 5/31 1728 HL= 0.26  subtherapeutic 6/01 0350 HL = 0.60, therapeutic x 1 6/01 1309 HL = 0.54  therapeutic x2  Goal of Therapy:  Heparin level 0.3-0.7 units/ml Monitor platelets by anticoagulation protocol: Yes   Plan:  6/01 1309 HL = 0.54  therapeutic x2 Continue heparin infusion at 1500 units/hr check HL with am labs CBC daily while on heparin  Bari Mantis PharmD Clinical Pharmacist 11/10/2022

## 2022-11-10 NOTE — Progress Notes (Signed)
ANTICOAGULATION CONSULT NOTE  Pharmacy Consult for heparin infusion Indication: ACS/STEMI (Medication induced paroxysmal atrial fibrillation)  Allergies  Allergen Reactions   Wasp Venom Anaphylaxis   Chlorhexidine Gluconate     Itching under breasts   Metronidazole Other (See Comments)    Chest tightness, neck tightness   Prednisone Other (See Comments)    Severe migraine / with taper dose Told by Cardiologist to avoid due to Afib   Other Rash    Bleach   Sodium Hypochlorite Rash    Liquid clorox bleach    Patient Measurements: Height: 5\' 6"  (167.6 cm) Weight: 123.8 kg (273 lb) IBW/kg (Calculated) : 59.3 Heparin Dosing Weight: 89 kg  Vital Signs: BP: 109/86 (06/01 0230) Pulse Rate: 77 (06/01 0230)  Labs: Recent Labs    11/09/22 0220 11/09/22 0420 11/09/22 0541 11/09/22 1135 11/09/22 1506 11/09/22 1728 11/10/22 0350  HGB 12.5  --   --   --   --   --  11.5*  HCT 39.6  --   --   --   --   --  36.5  PLT 352  --   --   --   --   --  302  APTT  --   --  73*  --   --   --   --   LABPROT  --   --  14.2  --   --   --   --   INR  --   --  1.1  --   --   --   --   HEPARINUNFRC  --   --   --  0.43  --  0.26* 0.60  CREATININE 0.74  --   --   --   --   --   --   TROPONINIHS 6   < >  --  8 6 7   --    < > = values in this interval not displayed.     Estimated Creatinine Clearance: 91.7 mL/min (by C-G formula based on SCr of 0.74 mg/dL).   Medical History: Past Medical History:  Diagnosis Date   ALLERGIC RHINITIS 04/03/2007   Allergy    Not sure   Anemia    ((Pt Qnr Sub: Denies at visit from 08/28/2021 with endocrinology))    Anginal pain (HCC)    Anxiety    Arthritis Not sure   Asthma    hx of years ago - no longer a problem    ASTHMA, PERSISTENT, MODERATE 04/03/2007   Cancer (HCC)    skin    Dyspnea    with exertion   Dyspnea on exertion    a. 11/2007 Echo: EF 60%.   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 11/2011 48h Holter: RSR, rare PVC's, occas PAC's   Retinal tear of right eye 01/2016   Thyroid nodule 04/15/2018  Ventral hernia     Assessment: Pt is a 67 yo female presenting to ED c/o palpitations and SOB.  5/31 1135 HL= 0.43  therapeutic x1 5/31 1728 HL= 0.26  subtherapeutic 6/01 0350 HL = 0.60, therapeutic x 1  Goal of Therapy:  Heparin level 0.3-0.7 units/ml Monitor platelets by anticoagulation protocol: Yes   Plan:  Continue heparin infusion at 1500 units/hr Recheck HL in 6 hr to confirm, then daily CBC daily while on heparin  Otelia Sergeant, PharmD, [facility] 11/10/2022 4:18 AM

## 2022-11-10 NOTE — Progress Notes (Signed)
PROGRESS NOTE    Kimberly Patton  ZOX:[id number] DOB: Jan 02, 1956 DOA: 11/09/2022 PCP: Excell Seltzer, MD   Assessment & Plan:   Principal Problem:   Paroxysmal atrial fibrillation with RVR (HCC) Active Problems:   CAD (coronary artery disease)   Chest pain   HTN (hypertension)   Hypercholesteremia   Asthma   Morbid obesity (HCC)  Assessment and Plan: PAF: w/ RVR. Continue on metoprolol. Continue on IV cardizem drip and wean as tolerated. Continue on IV heparin drip. Echo ordered. Continue on tele. Cardio following and recs apprec     Hx of CAD: w/ chest pain. Troponins neg x 5. Unlikely cardiac in etiology. Echo ordered.    HTN: hold HCTZ, verapamil, aldactone.    HLD: continue on statin    Asthma: unknown stage and/severity. Bronchodilators prn    Morbid obesity: BMI 44.0. Complicates overall care & prognosis        DVT prophylaxis: IV heparin  Code Status: full  Family Communication:  Disposition Plan: likely d/c back home  Level of care: Progressive Status is: Inpatient Remains inpatient appropriate because: severity of illness     Consultants:  Cardio   Procedures:   Antimicrobials:   Subjective: Pt c/o intermittent palpitations   Objective: Vitals:   11/10/22 0500 11/10/22 0600 11/10/22 0608 11/10/22 0845  BP: (!) 92/49 101/73    Pulse: 73 (!) 58 73   Resp: 12 17 16    Temp:    97.6 F (36.4 C)  TempSrc:    Oral  SpO2: 96% 100% 100%   Weight:      Height:        Intake/Output Summary (Last 24 hours) at 11/10/2022 0901 Last data filed at 11/10/2022 0153 Gross per 24 hour  Intake 70.52 ml  Output --  Net 70.52 ml   Filed Weights   11/09/22 0205  Weight: 123.8 kg    Examination:  General exam: Appears calm and comfortable. Morbidly obese  Respiratory system: decreased breath sounds b/l  Cardiovascular system: irregularly irregular. No  rubs, gallops or clicks. . Gastrointestinal system: Abdomen is obese, soft and nontender.   Normal bowel sounds heard. Central nervous system: Alert and oriented.moves all extremities  Psychiatry: Judgement and insight appear normal. Mood & affect appropriate.     Data Reviewed: I have personally reviewed following labs and imaging studies  CBC: Recent Labs  Lab 11/09/22 0220 11/10/22 0350  WBC 8.2 8.3  NEUTROABS 4.8  --   HGB 12.5 11.5*  HCT 39.6 36.5  MCV 82.8 83.3  PLT 352 302   Basic Metabolic Panel: Recent Labs  Lab 11/09/22 0220 11/09/22 1135 11/10/22 0350  NA 136  --  139  K 3.6  --  4.0  CL 100  --  105  CO2 26  --  26  GLUCOSE 99  --  93  BUN 33*  --  19  CREATININE 0.74  --  0.54  CALCIUM 9.8  --  8.8*  MG  --  2.7*  --    GFR: Estimated Creatinine Clearance: 91.7 mL/min (by C-G formula based on SCr of 0.54 mg/dL). Liver Function Tests: No results for input(s): "AST", "ALT", "ALKPHOS", "BILITOT", "PROT", "ALBUMIN" in the last 168 hours. No results for input(s): "LIPASE", "AMYLASE" in the last 168 hours. No results for input(s): "AMMONIA" in the last 168 hours. Coagulation Profile: Recent Labs  Lab 11/09/22 0541  INR 1.1   Cardiac Enzymes: No results for input(s): "CKTOTAL", "CKMB", "CKMBINDEX", "TROPONINI" in  the last 168 hours. BNP (last 3 results) No results for input(s): "PROBNP" in the last 8760 hours. HbA1C: Recent Labs    11/09/22 0220  HGBA1C 5.8*   CBG: No results for input(s): "GLUCAP" in the last 168 hours. Lipid Profile: Recent Labs    11/10/22 0350  CHOL 142  HDL 60  LDLCALC 56  TRIG 132  CHOLHDL 2.4   Thyroid Function Tests: Recent Labs    11/09/22 1135  TSH 2.732   Anemia Panel: No results for input(s): "VITAMINB12", "FOLATE", "FERRITIN", "TIBC", "IRON", "RETICCTPCT" in the last 72 hours. Sepsis Labs: No results for input(s): "PROCALCITON", "LATICACIDVEN" in the last 168 hours.  No results found for this or any previous visit (from the past 240 hour(s)).       Radiology Studies: DG Chest Port 1  View  Result Date: 11/09/2022 CLINICAL DATA:  Acute onset chest pain. Shortness of breath. Palpitations. EXAM: PORTABLE CHEST 1 VIEW COMPARISON:  08/30/2022 FINDINGS: Atherosclerotic calcification of the aortic arch. Mild enlargement of the cardiopericardial silhouette. The lungs appear clear.  No blunting of the costophrenic angles. IMPRESSION: 1. Mild enlargement of the cardiopericardial silhouette, without edema. 2. Atherosclerotic calcification of the aortic arch. Electronically Signed   By: Gaylyn Rong M.D.   On: 11/09/2022 13:29        Scheduled Meds:  acidophilus  1 capsule Oral Daily   cyanocobalamin  2,500 mcg Oral Daily   diltiazem  30 mg Oral BID   famotidine  20 mg Oral QHS   gabapentin  100 mg Oral Daily   metoprolol tartrate  25 mg Oral Q6H   mometasone-formoterol  2 puff Inhalation BID   montelukast  10 mg Oral QHS   multivitamin with minerals  1 tablet Oral Daily   omega-3 acid ethyl esters  1 g Oral Daily   pantoprazole  40 mg Oral QHS   rosuvastatin  10 mg Oral QHS   Continuous Infusions:  diltiazem (CARDIZEM) infusion Stopped (11/10/22 0153)   heparin 1,500 Units/hr (11/09/22 2005)     LOS: 0 days    Time spent: 35 mins     Charise Killian, MD [facility] Pager [phone]  If 7PM-7AM, please contact night-coverage [email or web address] 11/10/2022, 9:01 AM

## 2022-11-10 NOTE — Progress Notes (Signed)
Rounding Note    Patient Name: Kimberly Patton Date of Encounter: 11/10/2022  [facility] Cardiologist: Debbe Odea, MD   Subjective   Feeling OK.  Feels jittery in her abdomen.    Inpatient Medications    Scheduled Meds:  acidophilus  1 capsule Oral Daily   cyanocobalamin  2,500 mcg Oral Daily   diltiazem  30 mg Oral BID   famotidine  20 mg Oral QHS   gabapentin  100 mg Oral Daily   metoprolol tartrate  25 mg Oral Q6H   mometasone-formoterol  2 puff Inhalation BID   montelukast  10 mg Oral QHS   multivitamin with minerals  1 tablet Oral Daily   omega-3 acid ethyl esters  1 g Oral Daily   pantoprazole  40 mg Oral QHS   rosuvastatin  10 mg Oral QHS   Continuous Infusions:  diltiazem (CARDIZEM) infusion Stopped (11/10/22 0153)   heparin 1,500 Units/hr (11/09/22 2005)   PRN Meds: acetaminophen, albuterol, dextromethorphan-guaiFENesin, hydrALAZINE, methocarbamol, morphine injection, nitroGLYCERIN, traMADol   Vital Signs    Vitals:   11/10/22 0500 11/10/22 0600 11/10/22 0608 11/10/22 0845  BP: (!) 92/49 101/73    Pulse: 73 (!) 58 73   Resp: 12 17 16    Temp:    97.6 F (36.4 C)  TempSrc:    Oral  SpO2: 96% 100% 100%   Weight:      Height:        Intake/Output Summary (Last 24 hours) at 11/10/2022 1047 Last data filed at 11/10/2022 0153 Gross per 24 hour  Intake 70.52 ml  Output --  Net 70.52 ml      11/09/2022    2:05 AM 11/02/2022   11:51 AM 09/24/2022    1:40 PM  Last 3 Weights  Weight (lbs) 273 lb 277 lb 4.4 oz 277 lb  Weight (kg) 123.832 kg 125.77 kg 125.646 kg      Telemetry    Atrial fibrillation.  Rate 70-120s.  - Personally Reviewed  ECG    Atrial fibrillation.  Rate 148 bpm.  Low voltage - Personally Reviewed  Physical Exam   VS:  BP 101/73   Pulse 73   Temp 97.6 F (36.4 C) (Oral)   Resp 16   Ht 5\' 6"  (1.676 m)   Wt 123.8 kg   SpO2 100%   BMI 44.06 kg/m  , BMI Body mass index is 44.06 kg/m. GENERAL:  Well  appearing HEENT: Pupils equal round and reactive, fundi not visualized, oral mucosa unremarkable NECK:  No jugular venous distention, waveform within normal limits, carotid upstroke brisk and symmetric, no bruits, no thyromegaly LUNGS:  Clear to auscultation bilaterally HEART:  RRR.  PMI not displaced or sustained,S1 and S2 within normal limits, no S3, no S4, no clicks, no rubs, no murmurs ABD:  Flat, positive bowel sounds normal in frequency in pitch, no bruits, no rebound, no guarding, no midline pulsatile mass, no hepatomegaly, no splenomegaly EXT:  2 plus pulses throughout, no edema, no cyanosis no clubbing SKIN:  No rashes no nodules NEURO:  Cranial nerves II through XII grossly intact, motor grossly intact throughout PSYCH:  Cognitively intact, oriented to person place and time  Labs    High Sensitivity Troponin:   Recent Labs  Lab 11/09/22 0220 11/09/22 0420 11/09/22 1135 11/09/22 1506 11/09/22 1728  TROPONINIHS 6 6 8 6 7      Chemistry Recent Labs  Lab 11/09/22 0220 11/09/22 1135 11/10/22 0350  NA 136  --  139  K 3.6  --  4.0  CL 100  --  105  CO2 26  --  26  GLUCOSE 99  --  93  BUN 33*  --  19  CREATININE 0.74  --  0.54  CALCIUM 9.8  --  8.8*  MG  --  2.7*  --   GFRNONAA >60  --  >60  ANIONGAP 10  --  8    Lipids  Recent Labs  Lab 11/10/22 0350  CHOL 142  TRIG 132  HDL 60  LDLCALC 56  CHOLHDL 2.4    Hematology Recent Labs  Lab 11/09/22 0220 11/10/22 0350  WBC 8.2 8.3  RBC 4.78 4.38  HGB 12.5 11.5*  HCT 39.6 36.5  MCV 82.8 83.3  MCH 26.2 26.3  MCHC 31.6 31.5  RDW 15.2 15.3  PLT 352 302   Thyroid  Recent Labs  Lab 11/09/22 1135  TSH 2.732    BNPNo results for input(s): "BNP", "PROBNP" in the last 168 hours.  DDimer No results for input(s): "DDIMER" in the last 168 hours.   Radiology    DG Chest Port 1 View  Result Date: 11/09/2022 CLINICAL DATA:  Acute onset chest pain. Shortness of breath. Palpitations. EXAM: PORTABLE CHEST 1 VIEW  COMPARISON:  08/30/2022 FINDINGS: Atherosclerotic calcification of the aortic arch. Mild enlargement of the cardiopericardial silhouette. The lungs appear clear.  No blunting of the costophrenic angles. IMPRESSION: 1. Mild enlargement of the cardiopericardial silhouette, without edema. 2. Atherosclerotic calcification of the aortic arch. Electronically Signed   By: Gaylyn Rong M.D.   On: 11/09/2022 13:29    Cardiac Studies   Coronary CT-A 09/2021: IMPRESSION: 1. Coronary calcium score of 59.8. This was 76th percentile for age and sex matched control.   2. Normal coronary origin with right dominance.   3. Calcified plaque causing minimal stenosis in the proximal LAD, RCA, LCx arteries.   4. CAD-RADS 1. Minimal non-obstructive CAD (0-24%). Consider non-atherosclerotic causes of chest pain. Consider preventive therapy and risk factor modification.    Patient Profile     67 y.o. female with SVT, nonobstructive CAD, and paroxysmal atrial fibrillation here with atrial fibrillation with RVR.  Assessment & Plan    # PAF: Kimberly Patton remains in atrial fibrillation.  She continues to be symptomatic.  Rates are not consistently controlled and her blood pressure is low.  Therefore we cannot further titrate metoprolol and diltiazem.  Continue with heparin infusion until we get her echocardiogram.  If systolic function is normal we will transition her to Eliquis.  If not, we will keep her on heparin and plan for an ischemic evaluation.  Plan for TEE/DCCV if she continues to be in atrial fibrillation.  # Nonobstructive CAD: # Hyperlipidemia: Nonobstructive CAD on coronary CTA 09/2021.  Continue metoprolol and rosuvastatin.       For questions or updates, please contact  [facility] Please consult [email or web address] for contact info under        Signed, Chilton Si, MD  11/10/2022, 10:47 AM

## 2022-11-10 NOTE — Progress Notes (Signed)
  Echocardiogram 2D Echocardiogram has been performed.  Kimberly Patton 11/10/2022, 3:01 PM

## 2022-11-11 DIAGNOSIS — I480 Paroxysmal atrial fibrillation: Secondary | ICD-10-CM | POA: Diagnosis not present

## 2022-11-11 DIAGNOSIS — I10 Essential (primary) hypertension: Secondary | ICD-10-CM | POA: Diagnosis not present

## 2022-11-11 LAB — CBC
HCT: 40.1 % (ref 36.0–46.0)
Hemoglobin: 12.8 g/dL (ref 12.0–15.0)
MCH: 26.3 pg (ref 26.0–34.0)
MCHC: 31.9 g/dL (ref 30.0–36.0)
MCV: 82.5 fL (ref 80.0–100.0)
Platelets: 304 10*3/uL (ref 150–400)
RBC: 4.86 MIL/uL (ref 3.87–5.11)
RDW: 15.3 % (ref 11.5–15.5)
WBC: 9 10*3/uL (ref 4.0–10.5)
nRBC: 0 % (ref 0.0–0.2)

## 2022-11-11 LAB — ECHOCARDIOGRAM COMPLETE
AR max vel: 2.11 cm2
AV Peak grad: 6.3 mmHg
Ao pk vel: 1.25 m/s
Area-P 1/2: 2.39 cm2
Height: 66 in
S' Lateral: 2.8 cm
Weight: 4368 [oz_av]

## 2022-11-11 LAB — BASIC METABOLIC PANEL
Anion gap: 8 (ref 5–15)
BUN: 19 mg/dL (ref 8–23)
CO2: 26 mmol/L (ref 22–32)
Calcium: 9.3 mg/dL (ref 8.9–10.3)
Chloride: 106 mmol/L (ref 98–111)
Creatinine, Ser: 0.71 mg/dL (ref 0.44–1.00)
GFR, Estimated: >60 mL/min (ref >60)
Glucose, Bld: 85 mg/dL (ref 70–99)
Potassium: 3.8 mmol/L (ref 3.5–5.1)
Sodium: 140 mmol/L (ref 135–145)

## 2022-11-11 LAB — HEPARIN LEVEL (UNFRACTIONATED): Heparin Unfractionated: 0.59 IU/mL (ref 0.30–0.70)

## 2022-11-11 MED ORDER — APIXABAN 5 MG PO TABS
5.0000 mg | ORAL_TABLET | Freq: Two times a day (BID) | ORAL | Status: DC
  Administered 2022-11-11 – 2022-11-12 (×2): 5 mg via ORAL
  Filled 2022-11-11 (×2): qty 1

## 2022-11-11 NOTE — Progress Notes (Signed)
ANTICOAGULATION CONSULT NOTE  Pharmacy Consult for heparin infusion Indication: ACS/STEMI (Medication induced paroxysmal atrial fibrillation)  Allergies  Allergen Reactions   Wasp Venom Anaphylaxis   Chlorhexidine Gluconate     Itching under breasts   Metronidazole Other (See Comments)    Chest tightness, neck tightness   Prednisone Other (See Comments)    Severe migraine / with taper dose Told by Cardiologist to avoid due to Afib   Other Rash    Bleach   Sodium Hypochlorite Rash    Liquid clorox bleach    Patient Measurements: Height: 5\' 6"  (167.6 cm) Weight: 123.8 kg (273 lb) IBW/kg (Calculated) : 59.3 Heparin Dosing Weight: 89 kg  Vital Signs: Temp: 97.8 F (36.6 C) (06/02 0451) Temp Source: Oral (06/02 0040) BP: 116/81 (06/02 0451) Pulse Rate: 65 (06/02 0451)  Labs: Recent Labs    11/09/22 0220 11/09/22 0420 11/09/22 0541 11/09/22 1135 11/09/22 1506 11/09/22 1728 11/10/22 0350 11/10/22 1309 11/11/22 0455  HGB 12.5  --   --   --   --   --  11.5*  --  12.8  HCT 39.6  --   --   --   --   --  36.5  --  40.1  PLT 352  --   --   --   --   --  302  --  304  APTT  --   --  73*  --   --   --   --   --   --   LABPROT  --   --  14.2  --   --   --   --   --   --   INR  --   --  1.1  --   --   --   --   --   --   HEPARINUNFRC  --    < >  --  0.43  --  0.26* 0.60 0.54 0.59  CREATININE 0.74  --   --   --   --   --  0.54  --  0.71  TROPONINIHS 6   < >  --  8 6 7   --   --   --    < > = values in this interval not displayed.     Estimated Creatinine Clearance: 91.7 mL/min (by C-G formula based on SCr of 0.71 mg/dL).   Medical History: Past Medical History:  Diagnosis Date   ALLERGIC RHINITIS 04/03/2007   Allergy    Not sure   Anemia    ((Pt Qnr Sub: Denies at visit from 08/28/2021 with endocrinology))    Anginal pain (HCC)    Anxiety    Arthritis Not sure   Asthma    hx of years ago - no longer a problem    ASTHMA, PERSISTENT, MODERATE 04/03/2007   Cancer  (HCC)    skin    Dyspnea    with exertion   Dyspnea on exertion    a. 11/2007 Echo: EF 60%.   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 11/2011 48h Holter: RSR, rare PVC's, occas PAC's   Retinal tear of right eye 01/2016   Thyroid nodule 04/15/2018   Ventral hernia     Assessment: Pt is a 67 yo female presenting to ED c/o palpitations and SOB.  5/31 1135 HL= 0.43  therapeutic x1 5/31 1728 HL= 0.26  subtherapeutic 6/01 0350 HL = 0.60, therapeutic x 1 6/01 1309 HL = 0.54  therapeutic x2 6/02 0455 HL 0.59, therapeutic x 3  Goal of Therapy:  Heparin level 0.3-0.7 units/ml Monitor platelets by anticoagulation protocol: Yes   Plan:  Continue heparin infusion at 1500 units/hr check HL with am labs CBC daily while on heparin  Otelia Sergeant, PharmD, [facility] 11/11/2022 5:41 AM

## 2022-11-11 NOTE — Progress Notes (Addendum)
Rounding Note    Patient Name: Kimberly Patton Date of Encounter: 11/11/2022  [facility] Cardiologist: Debbe Odea, MD   Subjective   She remains in rate controlled Afib. She is fairly comfortable. She had little sleep last night. No chest pain.   Inpatient Medications    Scheduled Meds:  acidophilus  1 capsule Oral Daily   cyanocobalamin  2,500 mcg Oral Daily   diltiazem  30 mg Oral BID   famotidine  20 mg Oral QHS   gabapentin  100 mg Oral Daily   metoprolol tartrate  25 mg Oral Q6H   mometasone-formoterol  2 puff Inhalation BID   montelukast  10 mg Oral QHS   multivitamin with minerals  1 tablet Oral Daily   omega-3 acid ethyl esters  1 g Oral Daily   pantoprazole  40 mg Oral QHS   rosuvastatin  10 mg Oral QHS   Continuous Infusions:  diltiazem (CARDIZEM) infusion Stopped (11/10/22 0153)   heparin 1,500 Units/hr (11/11/22 0400)   PRN Meds: acetaminophen, albuterol, dextromethorphan-guaiFENesin, hydrALAZINE, methocarbamol, morphine injection, nitroGLYCERIN, mouth rinse, traMADol   Vital Signs    Vitals:   11/10/22 2201 11/10/22 2204 11/11/22 0040 11/11/22 0451  BP: (!) 103/90 113/85 110/86 116/81  Pulse: (!) 103   65  Resp:   18 18  Temp: 97.6 F (36.4 C)  97.8 F (36.6 C) 97.8 F (36.6 C)  TempSrc: Oral  Oral   SpO2:    100%  Weight:      Height:        Intake/Output Summary (Last 24 hours) at 11/11/2022 0731 Last data filed at 11/11/2022 0400 Gross per 24 hour  Intake 939.54 ml  Output --  Net 939.54 ml      11/09/2022    2:05 AM 11/02/2022   11:51 AM 09/24/2022    1:40 PM  Last 3 Weights  Weight (lbs) 273 lb 277 lb 4.4 oz 277 lb  Weight (kg) 123.832 kg 125.77 kg 125.646 kg      Telemetry    Afib HR 80-90s - Personally Reviewed  ECG    No new - Personally Reviewed  Physical Exam   GEN: No acute distress.   Neck: No JVD Cardiac: Irreg IRreg, no murmurs, rubs, or gallops.  Respiratory: Clear to auscultation  bilaterally. GI: Soft, nontender, non-distended  MS: No edema; No deformity. Neuro:  Nonfocal  Psych: Normal affect   Labs    High Sensitivity Troponin:   Recent Labs  Lab 11/09/22 0220 11/09/22 0420 11/09/22 1135 11/09/22 1506 11/09/22 1728  TROPONINIHS 6 6 8 6 7      Chemistry Recent Labs  Lab 11/09/22 0220 11/09/22 1135 11/10/22 0350 11/11/22 0455  NA 136  --  139 140  K 3.6  --  4.0 3.8  CL 100  --  105 106  CO2 26  --  26 26  GLUCOSE 99  --  93 85  BUN 33*  --  19 19  CREATININE 0.74  --  0.54 0.71  CALCIUM 9.8  --  8.8* 9.3  MG  --  2.7*  --   --   GFRNONAA >60  --  >60 >60  ANIONGAP 10  --  8 8    Lipids  Recent Labs  Lab 11/10/22 0350  CHOL 142  TRIG 132  HDL 60  LDLCALC 56  CHOLHDL 2.4    Hematology Recent Labs  Lab 11/09/22 0220 11/10/22 0350 11/11/22 0455  WBC 8.2  8.3 9.0  RBC 4.78 4.38 4.86  HGB 12.5 11.5* 12.8  HCT 39.6 36.5 40.1  MCV 82.8 83.3 82.5  MCH 26.2 26.3 26.3  MCHC 31.6 31.5 31.9  RDW 15.2 15.3 15.3  PLT 352 302 304   Thyroid  Recent Labs  Lab 11/09/22 1135  TSH 2.732    BNPNo results for input(s): "BNP", "PROBNP" in the last 168 hours.  DDimer No results for input(s): "DDIMER" in the last 168 hours.   Radiology    DG Chest Port 1 View  Result Date: 11/09/2022 CLINICAL DATA:  Acute onset chest pain. Shortness of breath. Palpitations. EXAM: PORTABLE CHEST 1 VIEW COMPARISON:  08/30/2022 FINDINGS: Atherosclerotic calcification of the aortic arch. Mild enlargement of the cardiopericardial silhouette. The lungs appear clear.  No blunting of the costophrenic angles. IMPRESSION: 1. Mild enlargement of the cardiopericardial silhouette, without edema. 2. Atherosclerotic calcification of the aortic arch. Electronically Signed   By: Gaylyn Rong M.D.   On: 11/09/2022 13:29    Cardiac Studies   Echo 11/10/22 pending  Coronary CT-A 09/2021: IMPRESSION: 1. Coronary calcium score of 59.8. This was 76th percentile for  age and sex matched control.   2. Normal coronary origin with right dominance.   3. Calcified plaque causing minimal stenosis in the proximal LAD, RCA, LCx arteries.   4. CAD-RADS 1. Minimal non-obstructive CAD (0-24%). Consider non-atherosclerotic causes of chest pain. Consider preventive therapy and risk factor modification.  Patient Profile     67 y.o. female with SVT, nonobstructive CAD, and paroxysmal atrial fibrillation here with atrial fibrillation with RVR.   Assessment & Plan    PAF - she has a h/o lone afib episode in the setting of steroid shot not on a/c - presented with sudden onset shortness of breath, palpitations, and chest pain found to be in rapid afib. She reported recent Cortizone shots in both arms.  - she was given IV dilt 20mg  x 2 and dilt 30mg  in the ER and started on IV heparin - CHADSVASC at least 3 (HTN, age, female) - Echo pending - rate control withe metoprolol 25mg  Q6H and dilt 30mg  BID - still in afib with rates 80-100. She feels she can't take a deep breath. - plan for TEE/DCCV tomorrow.   Nonobstructive CAD - Cardiac CTA 09/2021 showed nonobstructive CAD - continue metoprolol and statin  HLD - LDL 61 - continue Crestor  For questions or updates, please contact [facility] Please consult [email or web address] for contact info under        Signed, Cadence David Stall, PA-C  11/11/2022, 7:31 AM      Attending Addendum  History and all data above reviewed.  Patient examined.  I agree with the findings as above.  3All available labs, radiology testing, previous records reviewed. Agree with documented assessment and plan.  Kimberly Patton is a 67 y.o. female with SVT, nonobstructive CAD, and paroxysmal atrial fibrillation here with atrial fibrillation with RVR.   # PAF: Kimberly Patton remains in atrial fibrillation.  Rates are uncontrolled.  Blood pressure is too low to further titrate her nodal agents.  Planning for TEE/DCCV tomorrow.  Systolic  function was normal on echo.  Therefore no ischemic evaluation is needed at this time.  Will stop IV heparin and transition her to Eliquis.  Continue metoprolol and diltiazem for now.  Thyroid function is normal.  This was likely precipitated by her recent corticosteroid injection.  Given that this is the second time it has happened, recommend continuing  long-term anticoagulation.   This patients CHA2DS2-VASc Score and unadjusted Ischemic Stroke Rate (% per year) is equal to 3.2 % stroke rate/year from a score of 3  Above score calculated as 1 point each if present [CHF, HTN, DM, Vascular=MI/PAD/Aortic Plaque, Age if 65-74, or Female] Above score calculated as 2 points each if present [Age > 75, or Stroke/TIA/TE]  # Nonobstructive CAD: # Hyperlipidemia: Nonobstructive CAD on coronary CTA 09/2021.  Continue metoprolol and rosuvastatin.  Rooney Gladwin C. Duke Salvia, MD, [facility]  11/11/2022 1:46 PM

## 2022-11-11 NOTE — Progress Notes (Signed)
PROGRESS NOTE    Kimberly Patton  WUJ:[id number] DOB: December 06, 1955 DOA: 11/09/2022 PCP: Excell Seltzer, MD   Assessment & Plan:   Principal Problem:   Paroxysmal atrial fibrillation with RVR (HCC) Active Problems:   CAD (coronary artery disease)   Chest pain with moderate risk for cardiac etiology   HTN (hypertension)   Hypercholesteremia   Asthma   Morbid obesity (HCC)   Atrial fibrillation (HCC)  Assessment and Plan: PAF: w/ RVR. Continue on metoprolol, diltiazem. Weaned off of IV dilt drip. Continue on IV heparin drip. Echo shows EF 55-60%, diastolic function is indeterminate & no regional wall motion abnormalities. Continue on tele. Cardioversion 11/12/22 as per cardio. Cardio following and recs apprec     Hx of CAD: w/ chest pain. Troponins neg x 5. Unlikely cardiac in etiology.    HTN: continue on metoprolol, aldactone, verapamil    HLD: continue on statin    Asthma: unknown stage and/severity. Bronchodilators prn    Morbid obesity: BMI 44.0. Complicates overall care & prognosis        DVT prophylaxis: IV heparin  Code Status: full  Family Communication:  Disposition Plan: likely d/c back home  Level of care: Progressive Status is: Inpatient Remains inpatient appropriate because: severity of illness     Consultants:  Cardio   Procedures:   Antimicrobials:   Subjective: Pt c/o shortness of breath w/ exertion intermittently   Objective: Vitals:   11/10/22 2201 11/10/22 2204 11/11/22 0040 11/11/22 0451  BP: (!) 103/90 113/85 110/86 116/81  Pulse: (!) 103   65  Resp:   18 18  Temp: 97.6 F (36.4 C)  97.8 F (36.6 C) 97.8 F (36.6 C)  TempSrc: Oral  Oral   SpO2:    100%  Weight:      Height:        Intake/Output Summary (Last 24 hours) at 11/11/2022 0816 Last data filed at 11/11/2022 0800 Gross per 24 hour  Intake 999.51 ml  Output --  Net 999.51 ml   Filed Weights   11/09/22 0205  Weight: 123.8 kg    Examination:  General exam:  Appears comfortable. Morbidly obese  Respiratory system: diminished breath sounds b/l  Cardiovascular system: irregularly irregular.  Gastrointestinal system: abd is soft, NT, obese & normal bowel sounds  Central nervous system: Alert & oriented. Moves all extremities  Psychiatry: Judgement and insight appears normal. Appropriate mood and affect     Data Reviewed: I have personally reviewed following labs and imaging studies  CBC: Recent Labs  Lab 11/09/22 0220 11/10/22 0350 11/11/22 0455  WBC 8.2 8.3 9.0  NEUTROABS 4.8  --   --   HGB 12.5 11.5* 12.8  HCT 39.6 36.5 40.1  MCV 82.8 83.3 82.5  PLT 352 302 304   Basic Metabolic Panel: Recent Labs  Lab 11/09/22 0220 11/09/22 1135 11/10/22 0350 11/11/22 0455  NA 136  --  139 140  K 3.6  --  4.0 3.8  CL 100  --  105 106  CO2 26  --  26 26  GLUCOSE 99  --  93 85  BUN 33*  --  19 19  CREATININE 0.74  --  0.54 0.71  CALCIUM 9.8  --  8.8* 9.3  MG  --  2.7*  --   --    GFR: Estimated Creatinine Clearance: 91.7 mL/min (by C-G formula based on SCr of 0.71 mg/dL). Liver Function Tests: No results for input(s): "AST", "ALT", "ALKPHOS", "BILITOT", "PROT", "ALBUMIN"  in the last 168 hours. No results for input(s): "LIPASE", "AMYLASE" in the last 168 hours. No results for input(s): "AMMONIA" in the last 168 hours. Coagulation Profile: Recent Labs  Lab 11/09/22 0541  INR 1.1   Cardiac Enzymes: No results for input(s): "CKTOTAL", "CKMB", "CKMBINDEX", "TROPONINI" in the last 168 hours. BNP (last 3 results) No results for input(s): "PROBNP" in the last 8760 hours. HbA1C: Recent Labs    11/09/22 0220  HGBA1C 5.8*   CBG: No results for input(s): "GLUCAP" in the last 168 hours. Lipid Profile: Recent Labs    11/10/22 0350  CHOL 142  HDL 60  LDLCALC 56  TRIG 132  CHOLHDL 2.4   Thyroid Function Tests: Recent Labs    11/09/22 1135  TSH 2.732   Anemia Panel: No results for input(s): "VITAMINB12", "FOLATE",  "FERRITIN", "TIBC", "IRON", "RETICCTPCT" in the last 72 hours. Sepsis Labs: No results for input(s): "PROCALCITON", "LATICACIDVEN" in the last 168 hours.  No results found for this or any previous visit (from the past 240 hour(s)).       Radiology Studies: DG Chest Port 1 View  Result Date: 11/09/2022 CLINICAL DATA:  Acute onset chest pain. Shortness of breath. Palpitations. EXAM: PORTABLE CHEST 1 VIEW COMPARISON:  08/30/2022 FINDINGS: Atherosclerotic calcification of the aortic arch. Mild enlargement of the cardiopericardial silhouette. The lungs appear clear.  No blunting of the costophrenic angles. IMPRESSION: 1. Mild enlargement of the cardiopericardial silhouette, without edema. 2. Atherosclerotic calcification of the aortic arch. Electronically Signed   By: Gaylyn Rong M.D.   On: 11/09/2022 13:29        Scheduled Meds:  acidophilus  1 capsule Oral Daily   cyanocobalamin  2,500 mcg Oral Daily   diltiazem  30 mg Oral BID   famotidine  20 mg Oral QHS   gabapentin  100 mg Oral Daily   metoprolol tartrate  25 mg Oral Q6H   mometasone-formoterol  2 puff Inhalation BID   montelukast  10 mg Oral QHS   multivitamin with minerals  1 tablet Oral Daily   omega-3 acid ethyl esters  1 g Oral Daily   pantoprazole  40 mg Oral QHS   rosuvastatin  10 mg Oral QHS   Continuous Infusions:  diltiazem (CARDIZEM) infusion Stopped (11/10/22 0153)   heparin 1,500 Units/hr (11/11/22 0800)     LOS: 1 day    Time spent: 35 mins     Charise Killian, MD [facility] Pager [phone]  If 7PM-7AM, please contact night-coverage [email or web address] 11/11/2022, 8:16 AM

## 2022-11-12 ENCOUNTER — Ambulatory Visit (INDEPENDENT_AMBULATORY_CARE_PROVIDER_SITE_OTHER): Payer: BC Managed Care – PPO | Admitting: Vascular Surgery

## 2022-11-12 ENCOUNTER — Encounter: Payer: HMO | Admitting: Occupational Therapy

## 2022-11-12 ENCOUNTER — Telehealth: Payer: Self-pay | Admitting: Cardiology

## 2022-11-12 ENCOUNTER — Other Ambulatory Visit (HOSPITAL_COMMUNITY): Payer: Self-pay

## 2022-11-12 ENCOUNTER — Encounter
Admission: EM | Disposition: A | Discharge status: Home / Self Care | Payer: Self-pay | Source: Home / Self Care | Attending: Internal Medicine

## 2022-11-12 ENCOUNTER — Inpatient Hospital Stay: Payer: HMO | Admitting: Anesthesiology

## 2022-11-12 ENCOUNTER — Inpatient Hospital Stay
Admit: 2022-11-12 | Discharge: 2022-11-12 | Disposition: A | Discharge status: Home / Self Care | Payer: HMO | Attending: Medical | Admitting: Medical

## 2022-11-12 DIAGNOSIS — I10 Essential (primary) hypertension: Secondary | ICD-10-CM | POA: Diagnosis not present

## 2022-11-12 DIAGNOSIS — I480 Paroxysmal atrial fibrillation: Secondary | ICD-10-CM | POA: Diagnosis not present

## 2022-11-12 HISTORY — PX: CARDIOVERSION: SHX1299

## 2022-11-12 HISTORY — PX: TEE WITHOUT CARDIOVERSION: SHX5443

## 2022-11-12 LAB — CBC
HCT: 39.9 % (ref 36.0–46.0)
Hemoglobin: 12.9 g/dL (ref 12.0–15.0)
MCH: 26.3 pg (ref 26.0–34.0)
MCHC: 32.3 g/dL (ref 30.0–36.0)
MCV: 81.3 fL (ref 80.0–100.0)
Platelets: 340 10*3/uL (ref 150–400)
RBC: 4.91 MIL/uL (ref 3.87–5.11)
RDW: 15.3 % (ref 11.5–15.5)
WBC: 9.6 10*3/uL (ref 4.0–10.5)
nRBC: 0 % (ref 0.0–0.2)

## 2022-11-12 LAB — PROTIME-INR
INR: 1.1 (ref 0.8–1.2)
Prothrombin Time: 14.1 seconds (ref 11.4–15.2)

## 2022-11-12 LAB — BASIC METABOLIC PANEL
Anion gap: 8 (ref 5–15)
BUN: 26 mg/dL — ABNORMAL HIGH (ref 8–23)
CO2: 26 mmol/L (ref 22–32)
Calcium: 9.2 mg/dL (ref 8.9–10.3)
Chloride: 104 mmol/L (ref 98–111)
Creatinine, Ser: 0.77 mg/dL (ref 0.44–1.00)
GFR, Estimated: >60 mL/min (ref >60)
Glucose, Bld: 101 mg/dL — ABNORMAL HIGH (ref 70–99)
Potassium: 4.2 mmol/L (ref 3.5–5.1)
Sodium: 138 mmol/L (ref 135–145)

## 2022-11-12 SURGERY — ECHOCARDIOGRAM, TRANSESOPHAGEAL
Anesthesia: General

## 2022-11-12 MED ORDER — SODIUM CHLORIDE 0.9 % IV SOLN: INTRAVENOUS | Status: DC

## 2022-11-12 MED ORDER — ALPRAZOLAM 0.5 MG PO TABS
0.5000 mg | ORAL_TABLET | Freq: Once | ORAL | Status: AC
  Administered 2022-11-12: 0.5 mg via ORAL
  Filled 2022-11-12: qty 1

## 2022-11-12 MED ORDER — DILTIAZEM HCL ER COATED BEADS 120 MG PO CP24
120.0000 mg | ORAL_CAPSULE | Freq: Two times a day (BID) | ORAL | Status: DC
  Administered 2022-11-12: 120 mg via ORAL
  Filled 2022-11-12: qty 1

## 2022-11-12 MED ORDER — APIXABAN 5 MG PO TABS: 5.0000 mg | ORAL_TABLET | Freq: Two times a day (BID) | ORAL | 0 refills | Status: DC

## 2022-11-12 NOTE — Telephone Encounter (Signed)
Secure chart sent to to provider that seen patient yesterday  hey! I have someone on the phone stating the patients Cardioversion/tee isnt schedule. Looks like this is an inpatient   23 mins  Creig Hines, NP was added by Chilton Si, MD. 13 mins TR  Chilton Si, MD Hi. I'm not at [facility]. Adding the inpatient team  12 mins CB  Creig Hines, NP It's not scheduled b/c there's no room on the schedule. Will get to her shortly.

## 2022-11-12 NOTE — Discharge Summary (Addendum)
Physician Discharge Summary  Kimberly Patton ZOX:[id number] DOB: 1955-10-13 DOA: 11/09/2022  PCP: Excell Seltzer, MD  Admit date: 11/09/2022 Discharge date: 11/12/2022  Admitted From: home  Disposition:  home   Recommendations for Outpatient Follow-up:  Follow up with PCP in 1-2 weeks F/u w/ EP in 1-2 weeks   Home Health: no  Equipment/Devices:  Discharge Condition: stable  CODE STATUS: full  Diet recommendation: Heart Healthy   Brief/Interim Summary: Kimberly Patton is a 67 y.o. female with medical history significant of paroxysmal atrial fibrillation triggered by steroids (not on anticoagulants), HTN, HLD, asthma, CAD, morbid obesity, GERD, anxiety, lymphedema, who presents with palpitation, chest pain, shortness of breath.   Pt states that her symptoms started about 12:47 last night, including palpitation, heart racing, shortness of breath and chest pain.  No cough, fever or chills.  Her chest pain is located in the right side of chest initially, and then moving to and involving her middle chest, pressure-like, mild to moderate, nonradiating.  Patient has belching, no vomiting, diarrhea or abdominal pain.  Denies symptoms of UTI. She took diltiazem tablet without relief at home.  She states that has hx of paroxysmal atrial fibrillation that was primarily triggered by prednisone use in the past. Her cardiologist Dr. Azucena Cecil has not recommended anticoagulation. She states that she had different steroid injection to left elbow and left wrist on Tuesday.   Patient was found to have atrial fibrillation with RVR, heart rate up to 40s. IV fluid, 2 g magnesium sulfate and Cardizem IV were given with temporary improvement of heart rate to 70s, but then heart rate increased again up to 160s.  Cardizem drip was started.   Data reviewed independently and ED Course: pt was found to have trop 6 --> 6, WBC 8.2, GFR> 60, negative urinalysis, temperature normal, soft blood pressure, RR 28, oxygen  saturation 100% on room air.  Patient is placed to PCU for observation.  Dr. Kirke Corin of cardiology is consulted.     EKG: I have personally reviewed.  Atrial fibrillation, QTc 450, heart rate 125, low voltage, early R wave progression  Discharge Diagnoses:  Principal Problem:   Paroxysmal atrial fibrillation with RVR (HCC) Active Problems:   CAD (coronary artery disease)   Chest pain with moderate risk for cardiac etiology   HTN (hypertension)   Hypercholesteremia   Asthma   Morbid obesity (HCC)   Atrial fibrillation (HCC)  PAF: w/ RVR. Continue on home dose of verapmil as pt's insurance wont pay for ER diltiazem . D/c po metoprolol, IV dilt drip & IV heparin drip. Transition to po eliquis. Echo shows EF 55-60%, diastolic function is indeterminate & no regional wall motion abnormalities. Continue on tele. Converted to NSR today so no cardioversion needed, will need to f/u w/ EP in 1-2 weeks as per cardio. Cardio following and recs apprec     Hx of CAD: w/ chest pain. Troponins neg x 5. Unlikely cardiac in etiology.    HTN: continue on aldactone, verapamil. D/c metoprolol, ER diltiazem   HLD: continue on statin    Asthma: unknown stage and/severity. Bronchodilators prn    Morbid obesity: BMI 44.0. Complicates overall care & prognosis   Discharge Instructions  Discharge Instructions     Diet - low sodium heart healthy   Complete by: As directed    Discharge instructions   Complete by: As directed    F/u w/ EP w/in 1-2 weeks. F/u w/ PCP in 1-2 weeks   Increase activity  slowly   Complete by: As directed       Allergies as of 11/12/2022       Reactions   Wasp Venom Anaphylaxis   Chlorhexidine Gluconate    Itching under breasts   Metronidazole Other (See Comments)   Chest tightness, neck tightness   Prednisone Other (See Comments)   Severe migraine / with taper dose Told by Cardiologist to avoid due to Afib   Other Rash   Bleach   Sodium Hypochlorite Rash   Liquid  clorox bleach        Medication List     STOP taking these medications    diltiazem 120 MG 24 hr capsule Commonly known as: CARDIZEM CD       TAKE these medications    albuterol 108 (90 Base) MCG/ACT inhaler Commonly known as: VENTOLIN HFA Inhale 2 puffs into the lungs every 6 (six) hours as needed for wheezing or shortness of breath.   apixaban 5 MG Tabs tablet Commonly known as: ELIQUIS Take 1 tablet (5 mg total) by mouth 2 (two) times daily.   azelastine 0.1 % nasal spray Commonly known as: ASTELIN Place into both nostrils 2 (two) times daily. Use in each nostril as directed   CENTRUM SILVER 50+WOMEN PO Take 1 tablet by mouth daily.   CITRACAL + D PO Take 2 tablets by mouth daily.   CULTURELLE IMMUNITY SUPPORT PO Take 1 capsule by mouth daily.   diltiazem 30 MG tablet Commonly known as: CARDIZEM Take 30 mg by mouth 2 (two) times daily as needed.   famotidine 40 MG tablet Commonly known as: PEPCID Take 40 mg by mouth at bedtime.   Fish Oil 1200 MG Caps Take 1,200 mg by mouth daily.   fluticasone-salmeterol 250-50 MCG/ACT Aepb Commonly known as: Wixela Inhub Inhale 1 puff into the lungs in the morning and at bedtime. What changed: when to take this   gabapentin 100 MG capsule Commonly known as: NEURONTIN Take 100 mg by mouth daily.   hydrochlorothiazide 25 MG tablet Commonly known as: HYDRODIURIL Take 25 mg by mouth daily.   methocarbamol 500 MG tablet Commonly known as: ROBAXIN Take 500 mg by mouth at bedtime as needed for muscle spasms.   montelukast 10 MG tablet Commonly known as: SINGULAIR TAKE 1 TABLET BY MOUTH EVERYDAY AT BEDTIME   nitroGLYCERIN 0.4 MG SL tablet Commonly known as: NITROSTAT Place 1 tablet (0.4 mg total) under the tongue every 5 (five) minutes as needed for chest pain.   pantoprazole 40 MG tablet Commonly known as: PROTONIX TAKE 1 TABLET BY MOUTH EVERY DAY What changed:  how to take this when to take this   Qunol  Ultra CoQ10 100-150 MG-UNIT Caps Generic drug: Coenzyme Q10-Vitamin E Take 100 mg by mouth daily.   RECLAST IV Inject into the vein. Yearly injection   Riboflavin 400 MG Caps Take 400 mg by mouth daily.   rosuvastatin 10 MG tablet Commonly known as: CRESTOR Take 1 tablet (10 mg total) by mouth daily. What changed: when to take this   spironolactone 25 MG tablet Commonly known as: ALDACTONE Take 1 tablet (25 mg total) by mouth daily.   traMADol 50 MG tablet Commonly known as: ULTRAM Take 1 tablet (50 mg total) by mouth every 6 (six) hours as needed.   TURMERIC PO Take 2 tablets by mouth daily. 500 mg  With Ginger 50 mg Gummies   verapamil 120 MG CR tablet Commonly known as: CALAN-SR Take 1 tablet (120 mg  total) by mouth 2 (two) times daily.   Vitamin B-12 2500 MCG Subl Place 2,500 mcg under the tongue daily.   Vitamin D3 1.25 MG (50000 UT) Caps TAKE 1 CAPSULE (50,000 UNITS TOTAL) BY MOUTH ONCE A WEEK. TUESDAY   VITAMIN K2 PO Take 100 mg by mouth daily.   Voquezna 20 MG Tabs Generic drug: Vonoprazan Fumarate Take 1 tablet by mouth daily with breakfast.        Allergies  Allergen Reactions   Wasp Venom Anaphylaxis   Chlorhexidine Gluconate     Itching under breasts   Metronidazole Other (See Comments)    Chest tightness, neck tightness   Prednisone Other (See Comments)    Severe migraine / with taper dose Told by Cardiologist to avoid due to Afib   Other Rash    Bleach   Sodium Hypochlorite Rash    Liquid clorox bleach    Consultations: Cardio    Procedures/Studies: ECHOCARDIOGRAM COMPLETE  Result Date: 11/11/2022    ECHOCARDIOGRAM REPORT   Patient Name:   Kimberly Patton Date of Exam: 11/10/2022 Medical Rec #:  [id number]        Height:       66.0 in Accession #:    [id number]       Weight:       273.0 lb Date of Birth:  [birth year]        BSA:          2.283 m Patient Age:    67 years         BP:           152/69 mmHg Patient Gender: F                 HR:           111 bpm. Exam Location:  [facility] Procedure: 2D Echo Indications:     Atrial Fibrillation I48.91  History:         Patient has prior history of Echocardiogram examinations, most                  recent 09/28/2021.  Sonographer:     Overton Mam RDCS, FASE Referring Phys:  [id number] Francee Nodal FURTH Diagnosing Phys: Chilton Si MD IMPRESSIONS  1. Left ventricular ejection fraction, by estimation, is 55 to 60%. Left ventricular ejection fraction by PLAX is 58 %. The left ventricle has normal function. The left ventricle has no regional wall motion abnormalities. There is mild concentric left ventricular hypertrophy. Left ventricular diastolic parameters are indeterminate.  2. Right ventricular systolic function is normal. The right ventricular size is normal. There is normal pulmonary artery systolic pressure.  3. The mitral valve is normal in structure. Trivial mitral valve regurgitation. No evidence of mitral stenosis.  4. The aortic valve is tricuspid. Aortic valve regurgitation is not visualized. No aortic stenosis is present.  5. The inferior vena cava is normal in size with greater than 50% respiratory variability, suggesting right atrial pressure of 3 mmHg. FINDINGS  Left Ventricle: Left ventricular ejection fraction, by estimation, is 55 to 60%. Left ventricular ejection fraction by PLAX is 58 %. The left ventricle has normal function. The left ventricle has no regional wall motion abnormalities. The left ventricular internal cavity size was normal in size. There is mild concentric left ventricular hypertrophy. Left ventricular diastolic parameters are indeterminate. Normal left ventricular filling pressure. Right Ventricle: The right ventricular size is normal. No increase in right ventricular wall thickness. Right ventricular systolic  function is normal. There is normal pulmonary artery systolic pressure. The tricuspid regurgitant velocity is 2.33 m/s, and  with an assumed right atrial  pressure of 3 mmHg, the estimated right ventricular systolic pressure is 24.7 mmHg. Left Atrium: Left atrial size was normal in size. Right Atrium: Right atrial size was normal in size. Pericardium: There is no evidence of pericardial effusion. Mitral Valve: The mitral valve is normal in structure. Trivial mitral valve regurgitation. No evidence of mitral valve stenosis. Tricuspid Valve: The tricuspid valve is normal in structure. Tricuspid valve regurgitation is trivial. No evidence of tricuspid stenosis. Aortic Valve: The aortic valve is tricuspid. Aortic valve regurgitation is not visualized. No aortic stenosis is present. Aortic valve peak gradient measures 6.2 mmHg. Pulmonic Valve: The pulmonic valve was normal in structure. Pulmonic valve regurgitation is not visualized. No evidence of pulmonic stenosis. Aorta: The aortic root is normal in size and structure. Venous: The inferior vena cava is normal in size with greater than 50% respiratory variability, suggesting right atrial pressure of 3 mmHg. IAS/Shunts: No atrial level shunt detected by color flow Doppler.  LEFT VENTRICLE PLAX 2D LV EF:         Left            Diastology                ventricular     LV e' medial:    6.42 cm/s                ejection        LV E/e' medial:  9.0                fraction by     LV e' lateral:   7.83 cm/s                PLAX is 58      LV E/e' lateral: 7.4                %. LVIDd:         4.00 cm LVIDs:         2.80 cm LV PW:         1.30 cm LV IVS:        1.30 cm LVOT diam:     2.00 cm LV SV:         52 LV SV Index:   23 LVOT Area:     3.14 cm  RIGHT VENTRICLE RV Basal diam:  3.50 cm LEFT ATRIUM             Index        RIGHT ATRIUM           Index LA diam:        4.10 cm 1.80 cm/m   RA Area:     11.30 cm LA Vol (A2C):   35.9 ml 15.73 ml/m  RA Volume:   24.50 ml  10.73 ml/m LA Vol (A4C):   42.4 ml 18.58 ml/m LA Biplane Vol: 40.6 ml 17.79 ml/m  AORTIC VALVE                 PULMONIC VALVE AV Area (Vmax): 2.11 cm      PV Vmax:       1.06 m/s AV Vmax:        125.00 cm/s  PV Peak grad:  4.5 mmHg AV Peak Grad:   6.2 mmHg LVOT Vmax:      83.80 cm/s LVOT  Vmean:     54.600 cm/s LVOT VTI:       0.166 m  AORTA Ao Root diam: 3.10 cm MITRAL VALVE               TRICUSPID VALVE MV Area (PHT): 2.39 cm    TR Peak grad:   21.7 mmHg MV Decel Time: 318 msec    TR Vmax:        233.00 cm/s MV E velocity: 57.60 cm/s                            SHUNTS                            Systemic VTI:  0.17 m                            Systemic Diam: 2.00 cm Chilton Si MD Electronically signed by Chilton Si MD Signature Date/Time: 11/11/2022/8:33:35 AM    Final    DG Chest Port 1 View  Result Date: 11/09/2022 CLINICAL DATA:  Acute onset chest pain. Shortness of breath. Palpitations. EXAM: PORTABLE CHEST 1 VIEW COMPARISON:  08/30/2022 FINDINGS: Atherosclerotic calcification of the aortic arch. Mild enlargement of the cardiopericardial silhouette. The lungs appear clear.  No blunting of the costophrenic angles. IMPRESSION: 1. Mild enlargement of the cardiopericardial silhouette, without edema. 2. Atherosclerotic calcification of the aortic arch. Electronically Signed   By: Gaylyn Rong M.D.   On: 11/09/2022 13:29   (Echo, Carotid, EGD, Colonoscopy, ERCP)    Subjective:   Discharge Exam: Vitals:   11/12/22 1256 11/12/22 1300  BP: 126/80 112/82  Pulse: 72 70  Resp: 18 15  Temp: (!) 97 F (36.1 C)   SpO2: 99% 99%   Vitals:   11/12/22 0807 11/12/22 1140 11/12/22 1256 11/12/22 1300  BP: 124/85 126/82 126/80 112/82  Pulse: 69 79 72 70  Resp: 18 18 18 15   Temp: 97.9 F (36.6 C) 97.7 F (36.5 C) (!) 97 F (36.1 C)   TempSrc: Oral Oral Axillary   SpO2: 94% 99% 99% 99%  Weight:      Height:        General: Pt is alert, awake, not in acute distress Cardiovascular: S1/S2 +, no rubs, no gallops Respiratory: CTA bilaterally, no wheezing, no rhonchi Abdominal: Soft, NT, obese, bowel sounds + Extremities:  no  cyanosis    The results of significant diagnostics from this hospitalization (including imaging, microbiology, ancillary and laboratory) are listed below for reference.     Microbiology: No results found for this or any previous visit (from the past 240 hour(s)).   Labs: BNP (last 3 results) No results for input(s): "BNP" in the last 8760 hours. Basic Metabolic Panel: Recent Labs  Lab 11/09/22 0220 11/09/22 1135 11/10/22 0350 11/11/22 0455 11/12/22 0322  NA 136  --  139 140 138  K 3.6  --  4.0 3.8 4.2  CL 100  --  105 106 104  CO2 26  --  26 26 26   GLUCOSE 99  --  93 85 101*  BUN 33*  --  19 19 26*  CREATININE 0.74  --  0.54 0.71 0.77  CALCIUM 9.8  --  8.8* 9.3 9.2  MG  --  2.7*  --   --   --    Liver Function Tests: No results for  input(s): "AST", "ALT", "ALKPHOS", "BILITOT", "PROT", "ALBUMIN" in the last 168 hours. No results for input(s): "LIPASE", "AMYLASE" in the last 168 hours. No results for input(s): "AMMONIA" in the last 168 hours. CBC: Recent Labs  Lab 11/09/22 0220 11/10/22 0350 11/11/22 0455 11/12/22 0322  WBC 8.2 8.3 9.0 9.6  NEUTROABS 4.8  --   --   --   HGB 12.5 11.5* 12.8 12.9  HCT 39.6 36.5 40.1 39.9  MCV 82.8 83.3 82.5 81.3  PLT 352 302 304 340   Cardiac Enzymes: No results for input(s): "CKTOTAL", "CKMB", "CKMBINDEX", "TROPONINI" in the last 168 hours. BNP: Invalid input(s): "POCBNP" CBG: No results for input(s): "GLUCAP" in the last 168 hours. D-Dimer No results for input(s): "DDIMER" in the last 72 hours. Hgb A1c No results for input(s): "HGBA1C" in the last 72 hours. Lipid Profile Recent Labs    11/10/22 0350  CHOL 142  HDL 60  LDLCALC 56  TRIG 132  CHOLHDL 2.4   Thyroid function studies No results for input(s): "TSH", "T4TOTAL", "T3FREE", "THYROIDAB" in the last 72 hours.  Invalid input(s): "FREET3" Anemia work up No results for input(s): "VITAMINB12", "FOLATE", "FERRITIN", "TIBC", "IRON", "RETICCTPCT" in the last 72  hours. Urinalysis    Component Value Date/Time   COLORURINE STRAW (A) 11/09/2022 1136   APPEARANCEUR CLEAR (A) 11/09/2022 1136   LABSPEC 1.004 (L) 11/09/2022 1136   PHURINE 7.0 11/09/2022 1136   GLUCOSEU NEGATIVE 11/09/2022 1136   HGBUR NEGATIVE 11/09/2022 1136   BILIRUBINUR NEGATIVE 11/09/2022 1136   BILIRUBINUR Negative 08/17/2022 1544   KETONESUR NEGATIVE 11/09/2022 1136   PROTEINUR NEGATIVE 11/09/2022 1136   UROBILINOGEN 0.2 08/17/2022 1544   UROBILINOGEN 0.2 05/06/2010 1512   NITRITE NEGATIVE 11/09/2022 1136   LEUKOCYTESUR NEGATIVE 11/09/2022 1136   Sepsis Labs Recent Labs  Lab 11/09/22 0220 11/10/22 0350 11/11/22 0455 11/12/22 0322  WBC 8.2 8.3 9.0 9.6   Microbiology No results found for this or any previous visit (from the past 240 hour(s)).   Time coordinating discharge: Over 30 minutes  SIGNED:   Charise Killian, MD  [facility] 11/12/2022, 5:03 PM Pager   If 7PM-7AM, please contact night-coverage [email or web address]

## 2022-11-12 NOTE — Progress Notes (Signed)
EKG conducted and pt is back in NSR, per cardiologist team TEE and DCCV canceled, and pt will f/u with electrophysiology  ECHO, anesthesia, and tele (report to Amy, RN) notified

## 2022-11-12 NOTE — TOC Benefit Eligibility Note (Signed)
Patient Advocate Encounter  Insurance verification completed.    The patient is currently admitted and upon discharge could be taking Eliquis 5 mg.  The current 30 day co-pay is $47.00.   The patient is insured through [employer] Medicare Part D   This test claim was processed through [facility]- copay amounts may vary at other pharmacies due to pharmacy/plan contracts, or as the patient moves through the different stages of their insurance plan.  Constance Whittle, CPHT Pharmacy Patient Advocate Specialist [facility] Patient Advocate Team Direct Number: [phone]  Fax: [phone]       

## 2022-11-12 NOTE — Progress Notes (Signed)
Cardiology Progress Note   Patient Name: Kimberly Patton Date of Encounter: 11/12/2022  Primary Cardiologist: Debbe Odea, MD  Subjective   Feels sob this AM w/ minimal exertion.  Very disappointed at inability to have TEE/DCCV today (scheduling conflicts).  Inpatient Medications    Scheduled Meds:  acidophilus  1 capsule Oral Daily   apixaban  5 mg Oral BID   cyanocobalamin  2,500 mcg Oral Daily   diltiazem  30 mg Oral BID   famotidine  20 mg Oral QHS   gabapentin  100 mg Oral Daily   metoprolol tartrate  25 mg Oral Q6H   mometasone-formoterol  2 puff Inhalation BID   montelukast  10 mg Oral QHS   multivitamin with minerals  1 tablet Oral Daily   omega-3 acid ethyl esters  1 g Oral Daily   pantoprazole  40 mg Oral QHS   rosuvastatin  10 mg Oral QHS   Continuous Infusions:  sodium chloride     PRN Meds: acetaminophen, albuterol, dextromethorphan-guaiFENesin, hydrALAZINE, methocarbamol, morphine injection, nitroGLYCERIN, mouth rinse, traMADol   Vital Signs    Vitals:   11/11/22 2158 11/12/22 0024 11/12/22 0526 11/12/22 0807  BP: 106/76 (!) 80/62 105/73 124/85  Pulse: 99 86 80 69  Resp: 20   18  Temp: 97.9 F (36.6 C) 98 F (36.7 C)  97.9 F (36.6 C)  TempSrc: Oral   Oral  SpO2: 100% 100%  94%  Weight:      Height:        Intake/Output Summary (Last 24 hours) at 11/12/2022 1044 Last data filed at 11/11/2022 1800 Gross per 24 hour  Intake 584.28 ml  Output 240 ml  Net 344.28 ml   Filed Weights   11/09/22 0205  Weight: 123.8 kg    Physical Exam   GEN: Well nourished, well developed, in no acute distress.  HEENT: Grossly normal.  Neck: Supple, obese, difficult to gauge JVP.  No carotid bruits or masses. Cardiac: IR, IR, distant, no murmurs, rubs, or gallops. No clubbing, cyanosis, edema.  Radials 2+, DP/PT 2+ and equal bilaterally.  Respiratory:  Respirations regular and unlabored, clear to auscultation bilaterally. GI: Soft, nontender,  nondistended, BS + x 4. MS: no deformity or atrophy. Skin: warm and dry, no rash. Neuro:  Strength and sensation are intact. Psych: AAOx3.  Normal affect.  Labs    Chemistry Recent Labs  Lab 11/10/22 0350 11/11/22 0455 11/12/22 0322  NA 139 140 138  K 4.0 3.8 4.2  CL 105 106 104  CO2 26 26 26   GLUCOSE 93 85 101*  BUN 19 19 26*  CREATININE 0.54 0.71 0.77  CALCIUM 8.8* 9.3 9.2  GFRNONAA >60 >60 >60  ANIONGAP 8 8 8      Hematology Recent Labs  Lab 11/10/22 0350 11/11/22 0455 11/12/22 0322  WBC 8.3 9.0 9.6  RBC 4.38 4.86 4.91  HGB 11.5* 12.8 12.9  HCT 36.5 40.1 39.9  MCV 83.3 82.5 81.3  MCH 26.3 26.3 26.3  MCHC 31.5 31.9 32.3  RDW 15.3 15.3 15.3  PLT 302 304 340    Cardiac Enzymes  Recent Labs  Lab 11/09/22 0220 11/09/22 0420 11/09/22 1135 11/09/22 1506 11/09/22 1728  TROPONINIHS 6 6 8 6 7      ProBNP    Component Value Date/Time   PROBNP 40.0 12/07/2011 1053   Lipids  Lab Results  Component Value Date   CHOL 142 11/10/2022   HDL 60 11/10/2022   LDLCALC 56 11/10/2022  TRIG 132 11/10/2022   CHOLHDL 2.4 11/10/2022    HbA1c  Lab Results  Component Value Date   HGBA1C 5.8 (H) 11/09/2022    Radiology    DG Chest Port 1 View  Result Date: 11/09/2022 CLINICAL DATA:  Acute onset chest pain. Shortness of breath. Palpitations. EXAM: PORTABLE CHEST 1 VIEW COMPARISON:  08/30/2022 FINDINGS: Atherosclerotic calcification of the aortic arch. Mild enlargement of the cardiopericardial silhouette. The lungs appear clear.  No blunting of the costophrenic angles. IMPRESSION: 1. Mild enlargement of the cardiopericardial silhouette, without edema. 2. Atherosclerotic calcification of the aortic arch. Electronically Signed   By: Gaylyn Rong M.D.   On: 11/09/2022 13:29    Telemetry    Afib, 80's during periods of sleep, 100-150's while awake/w ambulation - Personally Reviewed  Cardiac Studies   Coronary CT Angiogram 4.2023  Coronary CT-A  09/2021: IMPRESSION: 1. Coronary calcium score of 59.8. This was 76th percentile for age and sex matched control. 2. Normal coronary origin with right dominance. 3. Calcified plaque causing minimal stenosis in the proximal LAD, RCA, LCx arteries. 4. CAD-RADS 1. Minimal non-obstructive CAD (0-24%). Consider non-atherosclerotic causes of chest pain. Consider preventive therapy and risk factor modification. _____________   2D Echocardiogram 6.1.2024  1. Left ventricular ejection fraction, by estimation, is 55 to 60%. Left  ventricular ejection fraction by PLAX is 58 %. The left ventricle has  normal function. The left ventricle has no regional wall motion  abnormalities. There is mild concentric left  ventricular hypertrophy. Left ventricular diastolic parameters are  indeterminate.   2. Right ventricular systolic function is normal. The right ventricular  size is normal. There is normal pulmonary artery systolic pressure.   3. The mitral valve is normal in structure. Trivial mitral valve  regurgitation. No evidence of mitral stenosis.   4. The aortic valve is tricuspid. Aortic valve regurgitation is not  visualized. No aortic stenosis is present.   5. The inferior vena cava is normal in size with greater than 50%  respiratory variability, suggesting right atrial pressure of 3 mmHg.  _____________   Patient Profile     67 y.o. female w/ a h/o nonobstructive CAD, PSVT, HL, obesity, and PAF, who was admitted w/ recurrent Afib.  Assessment & Plan    1.  PAF:  presented 5/31 w/ tachypalps  Dyspnea  Rapid afib.  Initially managed w/ IV dilt, now on PO dilt.  Rates reasonable @ rest (80's to 100) but jump to 120's to 150's w/ any activity and become assoc w/ profound dyspnea.  Trops nl.  Echo w/ nl EF.  Plan for TEE/DCCV today however, due to multiple scheduling conflicts, unable to be carried out.  Currently scheduled for TEE/DCCV for tomorrow @ 12:30p.  BP's trending soft, limiting  ability to further titrate ? blocker and dilt.  Cont eliquis.    2.  HL:  on crestor.  Signed, Nicolasa Ducking, NP  11/12/2022, 10:44 AM    For questions or updates, please contact   Please consult [email or web address] for contact info under Cardiology/STEMI.

## 2022-11-12 NOTE — Telephone Encounter (Signed)
Patient is concerned she is supposed to be scheduled for surgery today and wants an update on this.

## 2022-11-13 ENCOUNTER — Encounter: Payer: Self-pay | Admitting: Cardiovascular Disease

## 2022-11-14 ENCOUNTER — Encounter: Payer: HMO | Admitting: Occupational Therapy

## 2022-11-15 ENCOUNTER — Telehealth: Payer: Self-pay | Admitting: Cardiology

## 2022-11-15 NOTE — Telephone Encounter (Signed)
Pt called stating she was discharged on 5/31. She stated the first few days she had no energy and slept throughout the day.. She stated she has since regained her energy. However, she reported feeling lightheaded since yesterday and inquiring if symptoms could be related to Eliquis. She denies blood in urine or stool. Current BP 114/88  Pt also reported she occasionally feels her heart racing when she's up moving around. She stated fitbit noted no afib but HR as high as 142. 70-80's at rest. She denies feeling like she's going to black or pass out.   Pt has an appointment scheduled for 6/10 Nurse advised pt to keep a log of blood pressure and heart rate. Stay well hydrate. Change position slowly and keep scheduled appointment for 6/10.  Will forward to MD for any further recommendations.

## 2022-11-15 NOTE — Telephone Encounter (Signed)
Pt c/o medication issue:  1. Name of Medication: Eliquis  2. How are you currently taking this medication (dosage and times per day)? 25 times a day  3. Are you having a reaction (difficulty breathing--STAT)?   4. What is your medication issue? A little lightheaded- she wants to make she is alright- she said it is one of the side effects

## 2022-11-16 NOTE — Progress Notes (Unsigned)
Cardiology Office Note Date:  11/16/2022  Patient ID:  Kimberly Patton, Kimberly Patton 10/01/1955, MRN [id number] PCP:  Excell Seltzer, MD  Cardiologist:  Debbe Odea, MD Electrophysiologist: Lanier Prude, MD   MYRTICE LOWDERMILK is a 67 y.o. female with a history of CAD, HTN, HLD, Asthma, obesity, parox atrial fibrillation who presents for consultation for further mgmt of AFib.  The patient was initially diagnosed with atrial fibrillation 11/09/2022 after presenting to [facility] ER with symptoms of tachycardia, palpitations, shortness of breath.  She had recently obtained bilateral steroid injections arms.  Given that she was not on anticoagulation, she was rate controlled with diltiazem and metoprolol, and self converted.  Of note, the patient was evaluated by Dr. Lalla Brothers 04/2020 for SVT, at which time she stated that she had palpitations for a decade prior. Patient is on eliquis 5mg  BID for a CHADS2VASC score of at least 3.  On follow up today, ***  *** is compliant with anticoagulation and has not missed any doses. *** has no bleeding concerns.  Today, she denies symptoms of ***palpitations, chest pain, shortness of breath, orthopnea, PND, lower extremity edema, dizziness, presyncope, syncope, snoring, daytime somnolence, bleeding, or neurologic sequela. The patient is tolerating medications without difficulties and is otherwise without complaint today.   Atrial Fibrillation Risk Factors:  she {Action; does/does not:19097} have symptoms or diagnosis of sleep apnea. she {ACTION; IS/IS [id number]} compliant with CPAP therapy. she {Action; does/does not:19097} have a history of rheumatic fever. she {Action; does/does not:19097} have a history of alcohol use. The patient {Action; does/does not:19097} have a history of early familial atrial fibrillation or other arrhythmias.  she has a BMI of There is no height or weight on file to calculate BMI.. There were no vitals filed for this  visit.  Family History  Problem Relation Age of Onset   Breast cancer Mother    Diabetes Mother    Hypertension Mother    Kidney failure Mother    Diabetes Father    Hypertension Father    Diabetes Other    Hypertension Other    Stroke Other    Hemophilia Other      Atrial Fibrillation Management history:  Previous antiarrhythmic drugs: non Previous cardioversions: none Previous ablations: none Anticoagulation history: eliquis   Past Medical History:  Diagnosis Date   ALLERGIC RHINITIS 04/03/2007   Allergy    Not sure   Anemia    ((Pt Qnr Sub: Denies at visit from 08/28/2021 with endocrinology))    Anginal pain (HCC)    Anxiety    Arthritis Not sure   Asthma    hx of years ago - no longer a problem    ASTHMA, PERSISTENT, MODERATE 04/03/2007   Cancer (HCC)    skin    Dyspnea    with exertion   Dyspnea on exertion    a. 11/2007 Echo: EF 60%.   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 11/2011 48h Holter: RSR, rare PVC's, occas PAC's   Retinal tear of right eye 01/2016   Thyroid nodule 04/15/2018   Ventral hernia     Current Outpatient Medications  Medication Instructions   albuterol (VENTOLIN HFA) 108 (90 Base) MCG/ACT inhaler 2 puffs, Inhalation, Every 6 hours PRN   apixaban (ELIQUIS) 5 mg, Oral, 2 times daily   azelastine (ASTELIN) 0.1 % nasal spray Each Nare, 2 times daily, Use in each nostril as directed   Calcium Citrate-Vitamin D (CITRACAL + D PO) 2 tablets, Oral, Daily   Coenzyme  Q10-Vitamin E (QUNOL ULTRA COQ10) 100-150 MG-UNIT CAPS 100 mg, Oral, Daily   diltiazem (CARDIZEM) 30 mg, Oral, 2 times daily PRN   famotidine (PEPCID) 40 mg, Oral, Daily at bedtime   Fish Oil 1,200 mg, Oral, Daily   fluticasone-salmeterol (WIXELA INHUB) 250-50 MCG/ACT AEPB 1 puff, Inhalation, 2 times daily   gabapentin (NEURONTIN) 100 mg, Oral, Daily   hydrochlorothiazide (HYDRODIURIL) 25 mg, Oral, Daily   Lactobacillus Rhamnosus, GG, (CULTURELLE IMMUNITY SUPPORT PO) 1 capsule, Oral, Daily   Menaquinone-7 (VITAMIN K2 PO) 100 mg, Oral, Daily   methocarbamol (ROBAXIN) 500 mg, Oral, At bedtime PRN   montelukast (SINGULAIR) 10 MG tablet TAKE 1 TABLET BY MOUTH EVERYDAY AT BEDTIME   Multiple Vitamins-Minerals (CENTRUM SILVER 50+WOMEN PO) 1 tablet, Oral, Daily   nitroGLYCERIN (NITROSTAT) 0.4 mg, Sublingual, Every 5 min PRN   pantoprazole (PROTONIX) 40 MG tablet TAKE 1 TABLET BY MOUTH EVERY DAY   Riboflavin 400 mg, Oral, Daily   rosuvastatin (CRESTOR) 10 mg, Oral, Daily   spironolactone (ALDACTONE) 25 mg, Oral, Daily   traMADol (ULTRAM) 50 mg, Oral, Every 6 hours PRN   TURMERIC PO 2 tablets, Oral, Daily, 500 mg <BR>With Ginger 50 mg<BR>Gummies   verapamil (CALAN-SR) 120 mg, Oral, 2 times daily   Vitamin B-12 2,500 mcg, Sublingual, Daily   Vitamin D3 50,000 Units, Oral, Weekly, Tuesday    VOQUEZNA 20 MG TABS 1 tablet, Oral, Daily with breakfast   Zoledronic Acid (RECLAST IV) Intravenous, Yearly injection<BR>    Allergies  Allergen Reactions   Wasp Venom Anaphylaxis   Chlorhexidine Gluconate     Itching under breasts   Metronidazole Other (See Comments)    Chest tightness, neck tightness   Prednisone Other (See Comments)    Severe migraine / with taper dose Told by Cardiologist to avoid due to Afib   Other Rash    Bleach   Sodium Hypochlorite Rash    Liquid clorox bleach    Social History:  The patient  reports that she has never smoked. She has never used smokeless tobacco. She  reports that she does not currently use alcohol. She reports that she does not use drugs.   Family History:  *** include only if pertinent The patient's family history includes Breast cancer in her mother; Diabetes in her father, mother, and another family member; Hemophilia in an other family member; Hypertension in her father, mother, and another family member; Kidney failure in her mother; Stroke in an other family member.***  ROS:  Please see the history of present illness. All other systems are reviewed and otherwise negative.   PHYSICAL EXAM: *** VS:  There were no vitals taken for this visit. BMI: There is no height or weight on file to calculate BMI.  GEN- The patient is well appearing, alert and oriented x 3 today.   Lungs- Clear to ausculation  bilaterally, normal work of breathing.  Heart- {Blank single:19197::"Regular","Irregularly irregular"} rate and rhythm, no murmurs, rubs or gallops Extremities- {EDEMA LEVEL:28147::"No"} peripheral edema, warm, dry   EKG is ordered. Personal review of EKG from today shows:  ***  Recent Labs: 08/10/2022: ALT 12 11/09/2022: Magnesium 2.7; TSH 2.732 11/12/2022: BUN 26; Creatinine, Ser 0.77; Hemoglobin 12.9; Platelets 340; Potassium 4.2; Sodium 138  11/10/2022: Cholesterol 142; HDL 60; LDL Cholesterol 56; Total CHOL/HDL Ratio 2.4; Triglycerides 132; VLDL 26   Estimated Creatinine Clearance: 91.7 mL/min (by C-G formula based on SCr of 0.77 mg/dL).   Wt Readings from Last 3 Encounters:  11/09/22 273 lb (123.8 kg)  11/02/22 277 lb 4.4 oz (125.8 kg)  09/24/22 277 lb (125.6 kg)     Additional studies reviewed include: Previous EP, cardiology notes.   TTE, 11/10/2022  1. Left ventricular ejection fraction, by estimation, is 55 to 60%. Left ventricular ejection fraction by PLAX is 58 %. The left ventricle has normal function. The left ventricle has no regional wall motion abnormalities. There is mild concentric left ventricular hypertrophy. Left  ventricular diastolic parameters are indeterminate.   2. Right ventricular systolic function is normal. The right ventricular size is normal. There is normal pulmonary artery systolic pressure.   3. The mitral valve is normal in structure. Trivial mitral valve regurgitation. No evidence of mitral stenosis.   4. The aortic valve is tricuspid. Aortic valve regurgitation is not visualized. No aortic stenosis is present.   5. The inferior vena cava is normal in size with greater than 50% respiratory variability, suggesting right atrial pressure of 3 mmHg.    ASSESSMENT AND PLAN:  #) CHA2DS2-VASc Score = 3 (HTN, age, gender)  The patient's score is based upon: CHF History: 0 HTN History: 1 Diabetes History: 0 Stroke History: 0 Vascular Disease History: 0 Age Score: 1 Gender Score: 1   {Confirm score is correct.  If not, click here to update score.  REFRESH note.  :1}    ASSESSMENT AND PLAN: #) Paroxysmal Atrial Fibrillation The patient's CHA2DS2-VASc score is 3, indicating a 3.2% annual risk of stroke.  ***  #) Secondary Hypercoagulable State (ICD10:  D68.69){Click to add to Prob List or Visit Dx  :[id number]} The patient is at significant risk for stroke/thromboembolism based upon her CHA2DS2-VASc Score of 3.  Continue Apixaban (Eliquis).    #) Obesity There is no height or weight on file to calculate BMI. Lifestyle modification was discussed at length including regular exercise and weight reduction. ***  #) Snoring***Obstructive sleep apnea The importance of adequate treatment of sleep apnea was discussed today in order to improve our ability to maintain sinus rhythm long term. ***  #) HTN ***  #) ***    Current medicines are reviewed at length with the patient today.   The patient {ACTIONS; HAS/DOES NOT HAVE:19233} concerns regarding her medicines.  The following changes were made today:  {NONE DEFAULTED:18576}  Labs/ tests ordered today include: *** No orders of the  defined types were placed in this encounter.    Disposition: Follow up with {EPMDS:28135} in {EPFOLLOW UP:28173}   Signed, Sherie Don, NP  11/16/22  8:11 AM  Electrophysiology [facility]

## 2022-11-19 ENCOUNTER — Ambulatory Visit: Payer: HMO | Attending: Cardiology | Admitting: Cardiology

## 2022-11-19 ENCOUNTER — Encounter: Payer: HMO | Admitting: Occupational Therapy

## 2022-11-19 ENCOUNTER — Encounter: Payer: Self-pay | Admitting: Cardiology

## 2022-11-19 ENCOUNTER — Telehealth: Payer: Self-pay | Admitting: Pharmacist

## 2022-11-19 VITALS — BP 110/82 | HR 66 | Ht 66.0 in | Wt 274.0 lb

## 2022-11-19 DIAGNOSIS — I480 Paroxysmal atrial fibrillation: Secondary | ICD-10-CM

## 2022-11-19 DIAGNOSIS — I25118 Atherosclerotic heart disease of native coronary artery with other forms of angina pectoris: Secondary | ICD-10-CM

## 2022-11-19 DIAGNOSIS — D6869 Other thrombophilia: Secondary | ICD-10-CM

## 2022-11-19 DIAGNOSIS — I10 Essential (primary) hypertension: Secondary | ICD-10-CM | POA: Diagnosis not present

## 2022-11-19 NOTE — Patient Instructions (Signed)
Medication Instructions:  Your physician recommends that you continue on your current medications as directed. Please refer to the Current Medication list given to you today.  *If you need a refill on your cardiac medications before your next appointment, please call your pharmacy*   Lab Work: No labs ordered  If you have labs (blood work) drawn today and your tests are completely normal, you will receive your results only by: MyChart Message (if you have MyChart) OR A paper copy in the mail If you have any lab test that is abnormal or we need to change your treatment, we will call you to review the results.   Testing/Procedures: No testing ordered   Follow-Up: At [facility], you and your health needs are our priority.  As part of our continuing mission to provide you with exceptional heart care, we have created designated Provider Care Teams.  These Care Teams include your primary Cardiologist (physician) and Advanced Practice Providers (APPs -  Physician Assistants and Nurse Practitioners) who all work together to provide you with the care you need, when you need it.  We recommend signing up for the patient portal called "MyChart".  Sign up information is provided on this After Visit Summary.  MyChart is used to connect with patients for Virtual Visits (Telemedicine).  Patients are able to view lab/test results, encounter notes, upcoming appointments, etc.  Non-urgent messages can be sent to your provider as well.   To learn more about what you can do with MyChart, go to [email or web address].    Your next appointment:   3 month(s)  Provider:   Sherie Don, NP

## 2022-11-19 NOTE — Telephone Encounter (Signed)
Pt made aware  Agbor-Etang, Arlys John, MD  You; Alvin Critchley, Nadara Mustard, CMA3 days ago    Continue medications as prescribed, keep follow-up appointment with EP as scheduled.

## 2022-11-19 NOTE — Telephone Encounter (Signed)
PharmD reviewed patient chart to assess eligibility for [facility] services. Patient was determined to be a good candidate for the program given the complexity of the medication regimen and/or overall risk for hospitalization and increased utilization.  Referral entered in order to outreach patient and offer appointment with PharmD. Referral cosigned to PCP.  

## 2022-11-21 ENCOUNTER — Encounter: Payer: HMO | Admitting: Occupational Therapy

## 2022-11-26 ENCOUNTER — Encounter: Payer: HMO | Admitting: Occupational Therapy

## 2022-11-27 DIAGNOSIS — G514 Facial myokymia: Secondary | ICD-10-CM | POA: Diagnosis not present

## 2022-11-27 DIAGNOSIS — H2000 Unspecified acute and subacute iridocyclitis: Secondary | ICD-10-CM | POA: Diagnosis not present

## 2022-11-27 DIAGNOSIS — H43813 Vitreous degeneration, bilateral: Secondary | ICD-10-CM | POA: Diagnosis not present

## 2022-11-28 ENCOUNTER — Encounter: Payer: HMO | Admitting: Occupational Therapy

## 2022-12-03 ENCOUNTER — Encounter: Payer: Self-pay | Admitting: Cardiology

## 2022-12-04 DIAGNOSIS — H2000 Unspecified acute and subacute iridocyclitis: Secondary | ICD-10-CM | POA: Diagnosis not present

## 2022-12-08 ENCOUNTER — Encounter: Payer: Self-pay | Admitting: Pulmonary Disease

## 2022-12-10 ENCOUNTER — Ambulatory Visit (INDEPENDENT_AMBULATORY_CARE_PROVIDER_SITE_OTHER): Payer: HMO | Admitting: Primary Care

## 2022-12-10 ENCOUNTER — Encounter: Payer: Self-pay | Admitting: Primary Care

## 2022-12-10 ENCOUNTER — Encounter: Payer: Self-pay | Admitting: Cardiology

## 2022-12-10 ENCOUNTER — Ambulatory Visit (INDEPENDENT_AMBULATORY_CARE_PROVIDER_SITE_OTHER): Payer: HMO

## 2022-12-10 ENCOUNTER — Ambulatory Visit: Payer: HMO | Attending: Cardiology | Admitting: Cardiology

## 2022-12-10 VITALS — BP 130/76 | HR 82 | Temp 98.0°F | Ht 66.0 in | Wt 274.8 lb

## 2022-12-10 VITALS — BP 142/88 | HR 59 | Ht 66.0 in | Wt 274.8 lb

## 2022-12-10 DIAGNOSIS — I10 Essential (primary) hypertension: Secondary | ICD-10-CM

## 2022-12-10 DIAGNOSIS — I480 Paroxysmal atrial fibrillation: Secondary | ICD-10-CM

## 2022-12-10 DIAGNOSIS — J4530 Mild persistent asthma, uncomplicated: Secondary | ICD-10-CM

## 2022-12-10 DIAGNOSIS — E7800 Pure hypercholesterolemia, unspecified: Secondary | ICD-10-CM | POA: Diagnosis not present

## 2022-12-10 LAB — NITRIC OXIDE: Nitric Oxide: 24

## 2022-12-10 MED ORDER — LEVALBUTEROL TARTRATE 45 MCG/ACT IN AERO: 2.0000 | INHALATION_SPRAY | Freq: Four times a day (QID) | RESPIRATORY_TRACT | 2 refills | Status: DC | PRN

## 2022-12-10 MED ORDER — SPIRIVA RESPIMAT 1.25 MCG/ACT IN AERS: 2.0000 | INHALATION_SPRAY | Freq: Every day | RESPIRATORY_TRACT | 0 refills | Status: DC

## 2022-12-10 NOTE — Telephone Encounter (Signed)
Patient being seen in clinic today.

## 2022-12-10 NOTE — Progress Notes (Signed)
Cardiology Office Note:    Date:  12/10/2022   ID:  Kimberly Patton, DOB [birth year], MRN [id number]  PCP:  Excell Seltzer, MD  [facility] Cardiologist:  Debbe Odea, MD  [facility] Electrophysiologist:  Lanier Prude, MD   Referring MD: Excell Seltzer, MD   Chief Complaint  Patient presents with   Follow-up    Patient had recent admission with discharge on 11/12/22    History of Present Illness:    Kimberly Patton is a 67 y.o. female with a hx of paroxysmal atrial fibrillation, hypertension, hiatal hernia, GERD, iron deficiency anemia, SVT on diltiazem presenting for follow-up.    Admitted last month with symptoms of palpitations, diagnosed with atrial fibrillation.  States having a steroid shot in her elbow for arthritis leading to incident of A-fib.  Managed with diltiazem and metoprolol, self converted to sinus rhythm.  Started on Eliquis 5 mg twice daily.  PTA verapamil 120 mg CR twice daily was restarted.   as needed diltiazem 30 mg twice daily also given.  Has been doing well since discharge, has episode of palpitations only with using her inhalers.  Blood pressures have been well-controlled at home.  Worried about taking steroids for knee pain and fear of having another episode of A-fib.  Prior notes Cardiac monitor 04/2022 no A-fib, occasional paroxysmal SVT Echocardiogram 01/2020 showed normal systolic and diastolic function, EF 60 to 65%.  Lexiscan Myoview 01/2020 with no evidence for ischemia.  History of lone atrial fibrillation in the context of steroid administration. Has a history of hypokalemia while taking HCTZ. Losartan was started after stopping HCTZ. Developed nausea with losartan. This was stopped.   Past Medical History:  Diagnosis Date   ALLERGIC RHINITIS 04/03/2007   Allergy    Not sure   Anemia    ((Pt Qnr Sub: Denies at visit from 08/28/2021 with endocrinology))    Anginal pain (HCC)    Anxiety    Arthritis Not sure   Asthma    hx of  years ago - no longer a problem    ASTHMA, PERSISTENT, MODERATE 04/03/2007   Cancer (HCC)    skin    Dyspnea    with exertion   Dyspnea on exertion    a. 11/2007 Echo: EF 60%.   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 11/2011 48h Holter: RSR, rare PVC's, occas PAC's   Retinal tear of right eye 01/2016   Thyroid nodule 04/15/2018   Ventral hernia     Past Surgical History:  Procedure Laterality Date   ABDOMINAL HYSTERECTOMY  Think 1994   APPENDECTOMY  1996   ARTERY BIOPSY Left 03/21/2022   Procedure: BIOPSY TEMPORAL ARTERY;  Surgeon: Renford Dills, MD;  Location: [facility] ORS;  Service: Vascular;  Laterality: Left;   BIOPSY THYROID     08/2021   BREAST CYST  EXCISION Right 1994   Benign   CARDIOVERSION N/A 11/12/2022   Procedure: CARDIOVERSION;  Surgeon: Antonieta Iba, MD;  Location: [facility];  Service: Cardiovascular;  Laterality: N/A;   CESAREAN SECTION     x2   CHOLECYSTECTOMY  1995   COLONOSCOPY WITH PROPOFOL N/A 05/09/2021   Procedure: COLONOSCOPY WITH PROPOFOL;  Surgeon: Wyline Mood, MD;  Location: [facility] ENDOSCOPY;  Service: Gastroenterology;  Laterality: N/A;   ESOPHAGOGASTRODUODENOSCOPY (EGD) WITH PROPOFOL N/A 11/02/2022   Procedure: ESOPHAGOGASTRODUODENOSCOPY (EGD) WITH PROPOFOL;  Surgeon: Regis Bill, MD;  Location: [facility] ENDOSCOPY;  Service: Endoscopy;  Laterality: N/A;   EYE SURGERY  01/2016   Repair retinal tear   FOOT SURGERY  1995 / 1996   x2   HERNIA REPAIR     KNEE ARTHROSCOPY W/  MENISCAL REPAIR  07/25/2013   right knee Dr. Dion Saucier   LAPAROSCOPIC GASTRIC SLEEVE RESECTION N/A 06/08/2013   Procedure: LAPAROSCOPIC GASTRIC SLEEVE RESECTION AND EXCISION OF SEBACEUS CYST FROM MID CHEST takedown of incarcerated ventral hernia and primary repair endoscopy;  Surgeon: Valarie Merino, MD;  Location: [facility];  Service: General;  Laterality: N/A;   LAPAROSCOPIC NISSEN FUNDOPLICATION N/A 02/02/2020   Procedure: LAPAROSCOPIC REPAIR LARGE SYMPTOMATIC HIATAL HERNIA WITH UPPER ENDOSCOPY;  Surgeon: Luretha Murphy, MD;  Location: [facility];  Service: General;  Laterality: N/A;   moles  06/2013   removed 2 moles from under arm and  lowback   NECK SURGERY     occipital nerve damage- injections    OVARIAN CYST REMOVAL  1970   PILONIDAL CYST EXCISION  1975   RADIOACTIVE SEED GUIDED EXCISIONAL BREAST BIOPSY Right 06/23/2021   Procedure: RADIOACTIVE SEED GUIDED EXCISIONAL RIGHT BREAST BIOPSY;  Surgeon: Luretha Murphy, MD;  Location: [facility];  Service: General;  Laterality: Right;   RETINAL TEAR REPAIR CRYOTHERAPY Right 01/2016   [place]   SKIN CANCER EXCISION     TEE WITHOUT CARDIOVERSION N/A 11/12/2022   Procedure: TRANSESOPHAGEAL ECHOCARDIOGRAM;  Surgeon: Antonieta Iba, MD;  Location: [facility];  Service: Cardiovascular;  Laterality: N/A;   THYROID LOBECTOMY Left 08/21/2022   Procedure: LEFT THYROID LOBECTOMY;  Surgeon: Darnell Level, MD;  Location: [facility];  Service: General;  Laterality: Left;   THYROIDECTOMY, PARTIAL     TONSILLECTOMY     TUBAL LIGATION  Think 1994   UPPER GI ENDOSCOPY  06/08/2013   Procedure: UPPER GI ENDOSCOPY;  Surgeon: Valarie Merino, MD;  Location: [facility];  Service: General;;   US ECHOCARDIOGRAPHY  12/2011   WNL - EF 55-60%, mild MR, grade 1 diastolic dysnfiction (mild)   VENTRAL HERNIA REPAIR  2000    Current Medications: Current Meds  Medication Sig   apixaban (ELIQUIS) 5 MG TABS tablet Take 1 tablet (5 mg total) by mouth 2 (two) times  daily.   Calcium Citrate-Vitamin D (CITRACAL + D PO) Take 2 tablets by mouth daily.    Cholecalciferol (VITAMIN D3) 1.25 MG (50000 UT) CAPS TAKE 1 CAPSULE (50,000 UNITS TOTAL) BY MOUTH ONCE A WEEK. TUESDAY   Coenzyme Q10-Vitamin E (QUNOL ULTRA COQ10) 100-150 MG-UNIT CAPS Take 100 mg by mouth daily.   Cyanocobalamin (VITAMIN B-12) 2500 MCG SUBL Place 2,500 mcg under the tongue daily.   diltiazem (CARDIZEM) 30 MG tablet Take 30 mg by mouth 2 (two) times daily as needed.   gabapentin (NEURONTIN) 100 MG capsule Take 100 mg by mouth daily.   Lactobacillus Rhamnosus, GG, (CULTURELLE IMMUNITY SUPPORT PO) Take 1 capsule by mouth daily.  levalbuterol (XOPENEX HFA) 45 MCG/ACT inhaler Inhale 2 puffs into the lungs every 6 (six) hours as needed for wheezing.   Menaquinone-7 (VITAMIN K2 PO) Take 100 mg by mouth daily.   methocarbamol (ROBAXIN) 500 MG tablet Take 500 mg by mouth at bedtime as needed for muscle spasms.   montelukast (SINGULAIR) 10 MG tablet TAKE 1 TABLET BY MOUTH EVERYDAY AT BEDTIME   Multiple Vitamins-Minerals (CENTRUM SILVER 50+WOMEN PO) Take 1 tablet by mouth daily.   nitroGLYCERIN (NITROSTAT) 0.4 MG SL tablet Place 1 tablet (0.4 mg total) under the tongue every 5 (five) minutes as needed for chest pain.   Omega-3 Fatty Acids (FISH OIL) 1200 MG CAPS Take 1,200 mg by mouth daily.   Riboflavin 400 MG CAPS Take 400 mg by mouth daily.   rosuvastatin (CRESTOR) 10 MG tablet Take 1 tablet (10 mg total) by mouth daily.   spironolactone (ALDACTONE) 25 MG tablet Take 1 tablet (25 mg total) by mouth daily.   Tiotropium Bromide Monohydrate (SPIRIVA RESPIMAT) 1.25 MCG/ACT AERS Inhale 2 puffs into the lungs daily.   traMADol (ULTRAM) 50 MG tablet Take 1 tablet (50 mg total) by mouth every 6 (six) hours as needed.   TURMERIC PO Take 2 tablets by mouth daily. 500 mg  With Ginger 50 mg Gummies   verapamil (CALAN-SR) 120 MG CR tablet Take 1 tablet (120 mg total) by mouth 2 (two) times daily.   VOQUEZNA  20 MG TABS Take 1 tablet by mouth daily with breakfast.   Zoledronic Acid (RECLAST IV) Inject into the vein. Yearly injection     Allergies:   Wasp venom, Chlorhexidine gluconate, Metronidazole, Prednisone, Other, and Sodium hypochlorite   Social History   Socioeconomic History   Marital status: Divorced    Spouse name: Not on file   Number of children: 2   Years of education: 17   Highest education level: Not on file  Occupational History   Occupation: Magazine features editor: [employer]  Tobacco Use   Smoking status: Never   Smokeless tobacco: Never   Tobacco comments:    smoke at age 60-12  Vaping Use   Vaping Use: Never used  Substance and Sexual Activity   Alcohol use: Not Currently   Drug use: No   Sexual activity: Not Currently    Partners: Male    Birth control/protection: Abstinence, Pill  Other Topics Concern   Not on file  Social History Narrative   Lives at home alone.   Right-handed.   Occasional caffeine use.   Social Determinants of Health   Financial Resource Strain: Low Risk  (05/08/2022)   Overall Financial Resource Strain (CARDIA)    Difficulty of Paying Living Expenses: Not hard at all  Food Insecurity: No Food Insecurity (11/09/2022)   Hunger Vital Sign    Worried About Running Out of Food in the Last Year: Never true    Ran Out of Food in the Last Year: Never true  Transportation Needs: No Transportation Needs (11/09/2022)   PRAPARE - Administrator, Civil Service (Medical): No    Lack of Transportation (Non-Medical): No  Physical Activity: Inactive (05/08/2022)   Exercise Vital Sign    Days of Exercise per Week: 0 days    Minutes of Exercise per Session: 0 min  Stress: No Stress Concern Present (05/08/2022)   [employer] of Occupational Health - Occupational Stress Questionnaire    Feeling of Stress : Not at all  Social Connections: Moderately Integrated (  05/08/2022)   Social Connection and Isolation Panel  [NHANES]    Frequency of Communication with Friends and Family: More than three times a week    Frequency of Social Gatherings with Friends and Family: More than three times a week    Attends Religious Services: More than 4 times per year    Active Member of [employer] or Organizations: Yes    Attends Engineer, structural: More than 4 times per year    Marital Status: Divorced     Family History: The patient's family history includes Breast cancer in her mother; Diabetes in her father, mother, and another family member; Hemophilia in an other family member; Hypertension in her father, mother, and another family member; Kidney failure in her mother; Stroke in an other family member.  ROS:   Please see the history of present illness.     All other systems reviewed and are negative.  EKGs/Labs/Other Studies Reviewed:    The following studies were reviewed today:   EKG:  EKG not  ordered today.  Recent Labs: 08/10/2022: ALT 12 11/09/2022: Magnesium 2.7; TSH 2.732 11/12/2022: BUN 26; Creatinine, Ser 0.77; Hemoglobin 12.9; Platelets 340; Potassium 4.2; Sodium 138  Recent Lipid Panel    Component Value Date/Time   CHOL 142 11/10/2022 0350   CHOL 168 07/31/2021 0905   TRIG 132 11/10/2022 0350   HDL 60 11/10/2022 0350   HDL 67 07/31/2021 0905   CHOLHDL 2.4 11/10/2022 0350   VLDL 26 11/10/2022 0350   LDLCALC 56 11/10/2022 0350   LDLCALC 85 07/31/2021 0905    Physical Exam:    VS:  BP (!) 142/88 (BP Location: Left Arm, Patient Position: Sitting, Cuff Size: Large)   Pulse (!) 59   Ht 5\' 6"  (1.676 m)   Wt 274 lb 12.8 oz (124.6 kg)   SpO2 99%   BMI 44.35 kg/m     Wt Readings from Last 3 Encounters:  12/10/22 274 lb 12.8 oz (124.6 kg)  12/10/22 274 lb 12.8 oz (124.6 kg)  11/19/22 274 lb (124.3 kg)     GEN:  Well nourished, well developed in no acute distress, obese HEENT: Normal NECK: No JVD; No carotid bruits CARDIAC: RRR, no murmurs, rubs, gallops RESPIRATORY:  Clear to  auscultation without rales, wheezing or rhonchi  ABDOMEN: Soft, non-tender, non-distended MUSCULOSKELETAL: Lower extremity edema/ lymphedema noted SKIN: Warm and dry NEUROLOGIC:  Alert and oriented x 3 PSYCHIATRIC:  Normal affect   ASSESSMENT:    1. Paroxysmal A-fib (HCC)   2. Primary hypertension   3. Pure hypercholesterolemia   4. Morbid obesity (HCC)    PLAN:    In order of problems listed above:   Paroxysmal atrial fibrillation, occasional palpitations with using inhaled steroids.  Continue verapamil 120 mg CR twice daily, Eliquis.  Please cardiac monitor to evaluate presence of A-fib/burden.  Already established with the A-fib clinic.  Follow-up with EP. hypertension.  BP elevated today, well controlled at home.  Continue Cardizem SR 120 mg twice daily, Aldactone.. Hyperlipidemia, continue Crestor 10 mg daily.  Morbid obesity, low-calorie diet, weight loss advised..  Follow-up in 3 months   Total encounter time more than 40 minutes  Greater than 50% was spent in counseling and coordination of care with the patient   This note was generated in part or whole with voice recognition software. Voice recognition is usually quite accurate but there are transcription errors that can and very often do occur. I apologize for any typographical errors that were  not detected and corrected.  Medication Adjustments/Labs and Tests Ordered: Current medicines are reviewed at length with the patient today.  Concerns regarding medicines are outlined above.  Orders Placed This Encounter  Procedures   LONG TERM MONITOR (3-14 DAYS)   EKG 12-Lead    No orders of the defined types were placed in this encounter.    Patient Instructions  Medication Instructions:  The current medical regimen is effective;  continue present plan and medications.  *If you need a refill on your cardiac medications before your next appointment, please call your pharmacy*   Testing/Procedures:  ZIO XT- Long  Term Monitor Instructions  Your physician has requested you wear a ZIO patch monitor for 14 days.  This is a single patch monitor. [employer] supplies one patch monitor per enrollment. Additional stickers are not available. Please do not apply patch if you will be having a Nuclear Stress Test,  Echocardiogram, Cardiac CT, MRI, or Chest Xray during the period you would be wearing the  monitor. The patch cannot be worn during these tests. You cannot remove and re-apply the  ZIO XT patch monitor.  Your ZIO patch monitor will be mailed 3 day USPS to your address on file. It may take 3-5 days  to receive your monitor after you have been enrolled.  Once you have received your monitor, please review the enclosed instructions. Your monitor  has already been registered assigning a specific monitor serial # to you.  Billing and Patient Assistance Program Information  We have supplied [employer] with any of your insurance information on file for billing purposes. [employer] offers a sliding scale Patient Assistance Program for patients that do not have  insurance, or whose insurance does not completely cover the cost of the ZIO monitor.  You must apply for the Patient Assistance Program to qualify for this discounted rate.  To apply, please call [employer] at [phone], select option 4, select option 2, ask to apply for  Patient Assistance Program. Meredeth Ide will ask your household income, and how many people  are in your household. They will quote your out-of-pocket cost based on that information.  [employer] will also be able to set up a 14-month, interest-free payment plan if needed.  Applying the monitor   Shave hair from upper left chest.  Hold abrader disc by orange tab. Rub abrader in 40 strokes over the upper left chest as  indicated in your monitor instructions.  Clean area with 4 enclosed alcohol pads. Let dry.  Apply patch as indicated in monitor instructions. Patch will be placed under collarbone on  left  side of chest with arrow pointing upward.  Rub patch adhesive wings for 2 minutes. Remove white label marked "1". Remove the white  label marked "2". Rub patch adhesive wings for 2 additional minutes.  While looking in a mirror, press and release button in center of patch. A small green light will  flash 3-4 times. This will be your only indicator that the monitor has been turned on.  Do not shower for the first 24 hours. You may shower after the first 24 hours.  Press the button if you feel a symptom. You will hear a small click. Record Date, Time and  Symptom in the Patient Logbook.  When you are ready to remove the patch, follow instructions on the last 2 pages of Patient  Logbook. Stick patch monitor onto the last page of Patient Logbook.  Place Patient Logbook in the blue and white box. Use locking  tab on box and tape box closed  securely. The blue and white box has prepaid postage on it. Please place it in the mailbox as  soon as possible. Your physician should have your test results approximately 7 days after the  monitor has been mailed back to [facility].  Call [facility] Customer Care at [phone] if you have questions regarding  your ZIO XT patch monitor. Call them immediately if you see an orange light blinking on your  monitor.  If your monitor falls off in less than 4 days, contact our Monitor department at [phone].  If your monitor becomes loose or falls off after 4 days call [employer] at [phone] for  suggestions on securing your monitor    Follow-Up: At [facility], you and your health needs are our priority.  As part of our continuing mission to provide you with exceptional heart care, we have created designated Provider Care Teams.  These Care Teams include your primary Cardiologist (physician) and Advanced Practice Providers (APPs -  Physician Assistants and Nurse Practitioners) who all work together to provide you with the care you  need, when you need it.  We recommend signing up for the patient portal called "MyChart".  Sign up information is provided on this After Visit Summary.  MyChart is used to connect with patients for Virtual Visits (Telemedicine).  Patients are able to view lab/test results, encounter notes, upcoming appointments, etc.  Non-urgent messages can be sent to your provider as well.   To learn more about what you can do with MyChart, go to [email or web address].    Your next appointment:   Follow up with Dr.Lambert, then follow up with Dr.Agbor-Etang in 2-3 months.      Signed, Debbe Odea, MD  12/10/2022 10:59 AM    [facility]

## 2022-12-10 NOTE — Patient Instructions (Addendum)
Recommendation: - Stop Wixela - Start Spiriva respimat 1.33mcg two puffs daily (this is a bronchodilator used for asthma- if you find helpful for chest tightness and shortness of breath let us know and we can send in RX. If not difference, stop and we will discontinue) - Continue Singulair 10mg  at bedxtime  - We will change ALBUTEROL to LEVALBUTEROL (which is preferred for patient with afib / less chance of tachycardia) Use rescue inhaler every 6 hours for breakthrough shortness of breath/wheezing/chest tightness   Follow-up: 4-6 months with Dr. Jayme Cloud or sooner if needed   Asthma, Adult  Asthma is a long-term (chronic) condition that causes recurrent episodes in which the lower airways in the lungs become tight and narrow. The narrowing is caused by inflammation and tightening of the smooth muscle around the lower airways. Asthma episodes, also called asthma attacks or asthma flares, may cause coughing, making high-pitched whistling sounds when you breathe, most often when you breathe out (wheezing), shortness of breath, and chest pain. The airways may produce extra mucus caused by the inflammation and irritation. During an attack, it can be difficult to breathe. Asthma attacks can range from minor to life-threatening. Asthma cannot be cured, but medicines and lifestyle changes can help control it and treat acute attacks. It is important to keep your asthma well controlled so the condition does not interfere with your daily life. What are the causes? This condition is believed to be caused by inherited (genetic) and environmental factors, but its exact cause is not known. What can trigger an asthma attack? Many things can bring on an asthma attack or make symptoms worse. These triggers are different for every person. Common triggers include: Allergens and irritants like mold, dust, pet dander, cockroaches, pollen, air pollution, and chemical odors. Cigarette smoke. Weather changes and cold  air. Stress and strong emotional responses such as crying or laughing hard. Certain medications such as aspirin or beta blockers. Infections and inflammatory conditions, such as the flu, a cold, pneumonia, or inflammation of the nasal membranes (rhinitis). Gastroesophageal reflux disease (GERD). What are the signs or symptoms? Symptoms may occur right after exposure to an asthma trigger or hours later and can vary by person. Common signs and symptoms include: Wheezing. Trouble breathing (shortness of breath). Excessive nighttime or early morning coughing. Chest tightness. Tiredness (fatigue) with minimal activity. Difficulty talking in complete sentences. Poor exercise tolerance. How is this diagnosed? This condition is diagnosed based on: A physical exam and your medical history. Tests, which may include: Lung function studies to evaluate the flow of air in your lungs. Allergy tests. Imaging tests, such as X-rays. How is this treated? There is no cure, but symptoms can be controlled with proper treatment. Treatment usually involves: Identifying and avoiding your asthma triggers. Inhaled medicines. Two types are commonly used to treat asthma, depending on severity: Controller medicines. These help prevent asthma symptoms from occurring. They are taken every day. Fast-acting reliever or rescue medicines. These quickly relieve asthma symptoms. They are used as needed and provide short-term relief. Using other medicines, such as: Allergy medicines, such as antihistamines, if your asthma attacks are triggered by allergens. Immune medicines (immunomodulators). These are medicines that help control the immune system. Using supplemental oxygen. This is only needed during a severe episode. Creating an asthma action plan. An asthma action plan is a written plan for managing and treating your asthma attacks. This plan includes: A list of your asthma triggers and how to avoid them. Information  about when medicines  should be taken and when their dosage should be changed. Instructions about using a device called a peak flow meter. A peak flow meter measures how well the lungs are working and the severity of your asthma. It helps you monitor your condition. Follow these instructions at home: Take over-the-counter and prescription medicines only as told by your health care provider. Stay up to date on all vaccinations as recommended by your healthcare provider, including vaccines for the flu and pneumonia. Use a peak flow meter and keep track of your peak flow readings. Understand and use your asthma action plan to address any asthma flares. Do not smoke or allow anyone to smoke in your home. Contact a health care provider if: You have wheezing, shortness of breath, or a cough that is not responding to medicines. Your medicines are causing side effects, such as a rash, itching, swelling, or trouble breathing. You need to use a reliever medicine more than 2-3 times a week. Your peak flow reading is still at 50-79% of your personal best after following your action plan for 1 hour. You have a fever and shortness of breath. Get help right away if: You are getting worse and do not respond to treatment during an asthma attack. You are short of breath when at rest or when doing very little physical activity. You have difficulty eating, drinking, or talking. You have chest pain or tightness. You develop a fast heartbeat or palpitations. You have a bluish color to your lips or fingernails. You are light-headed or dizzy, or you faint. Your peak flow reading is less than 50% of your personal best. You feel too tired to breathe normally. These symptoms may be an emergency. Get help right away. Call 911. Do not wait to see if the symptoms will go away. Do not drive yourself to the hospital. Summary Asthma is a long-term (chronic) condition that causes recurrent episodes in which the airways  become tight and narrow. Asthma episodes, also called asthma attacks or asthma flares, can cause coughing, wheezing, shortness of breath, and chest pain. Asthma cannot be cured, but medicines and lifestyle changes can help keep it well controlled and prevent asthma flares. Make sure you understand how to avoid triggers and how and when to use your medicines. Asthma attacks can range from minor to life-threatening. Get help right away if you have an asthma attack and do not respond to treatment with your usual rescue medicines. This information is not intended to replace advice given to you by your health care provider. Make sure you discuss any questions you have with your health care provider. Document Revised: 03/15/2021 Document Reviewed: 03/06/2021 Elsevier Patient Education  2024 [employer].

## 2022-12-10 NOTE — Patient Instructions (Signed)
Medication Instructions:  The current medical regimen is effective;  continue present plan and medications.  *If you need a refill on your cardiac medications before your next appointment, please call your pharmacy*   Testing/Procedures:  [place] Monitor Instructions  Your physician has requested you wear a ZIO patch monitor for 14 days.  This is a single patch monitor. [employer] supplies one patch monitor per enrollment. Additional stickers are not available. Please do not apply patch if you will be having a Nuclear Stress Test,  Echocardiogram, Cardiac CT, MRI, or Chest Xray during the period you would be wearing the  monitor. The patch cannot be worn during these tests. You cannot remove and re-apply the  ZIO XT patch monitor.  Your ZIO patch monitor will be mailed 3 day USPS to your address on file. It may take 3-5 days  to receive your monitor after you have been enrolled.  Once you have received your monitor, please review the enclosed instructions. Your monitor  has already been registered assigning a specific monitor serial # to you.  Billing and Patient Assistance Program Information  We have supplied [employer] with any of your insurance information on file for billing purposes. [employer] offers a sliding scale Patient Assistance Program for patients that do not have  insurance, or whose insurance does not completely cover the cost of the ZIO monitor.  You must apply for the Patient Assistance Program to qualify for this discounted rate.  To apply, please call [employer] at [phone], select option 4, select option 2, ask to apply for  Patient Assistance Program. Theodore Demark will ask your household income, and how many people  are in your household. They will quote your out-of-pocket cost based on that information.  [employer] will also be able to set up a 29-month interest-free payment plan if needed.  Applying the monitor   Shave hair from upper left chest.  Hold abrader  disc by orange tab. Rub abrader in 40 strokes over the upper left chest as  indicated in your monitor instructions.  Clean area with 4 enclosed alcohol pads. Let dry.  Apply patch as indicated in monitor instructions. Patch will be placed under collarbone on left  side of chest with arrow pointing upward.  Rub patch adhesive wings for 2 minutes. Remove white label marked "1". Remove the white  label marked "2". Rub patch adhesive wings for 2 additional minutes.  While looking in a mirror, press and release button in center of patch. A small green light will  flash 3-4 times. This will be your only indicator that the monitor has been turned on.  Do not shower for the first 24 hours. You may shower after the first 24 hours.  Press the button if you feel a symptom. You will hear a small click. Record Date, Time and  Symptom in the Patient Logbook.  When you are ready to remove the patch, follow instructions on the last 2 pages of Patient  Logbook. Stick patch monitor onto the last page of Patient Logbook.  Place Patient Logbook in the blue and white box. Use locking tab on box and tape box closed  securely. The blue and white box has prepaid postage on it. Please place it in the mailbox as  soon as possible. Your physician should have your test results approximately 7 days after the  monitor has been mailed back [address]  Call [employer] [phone] you have questions regarding  your ZIO  XT patch monitor. Call them immediately if you see an orange light blinking on your  monitor.  If your monitor falls off in less than 4 days, contact our Monitor department at [phone].  If your monitor becomes loose or falls off after 4 days call [employer] at [phone] for  suggestions on securing your monitor    Follow-Up: At [facility], you and your health needs are our priority.  As part of our continuing mission to provide you with exceptional heart  care, we have created designated Provider Care Teams.  These Care Teams include your primary Cardiologist (physician) and Advanced Practice Providers (APPs -  Physician Assistants and Nurse Practitioners) who all work together to provide you with the care you need, when you need it.  We recommend signing up for the patient portal called "MyChart".  Sign up information is provided on this After Visit Summary.  MyChart is used to connect with patients for Virtual Visits (Telemedicine).  Patients are able to view lab/test results, encounter notes, upcoming appointments, etc.  Non-urgent messages can be sent to your provider as well.   To learn more about what you can do with MyChart, go to [email or web address].    Your next appointment:   Follow up with Dr.Lambert, then follow up with Dr.Agbor-Etang in 2-3 months.

## 2022-12-10 NOTE — Telephone Encounter (Signed)
Dr. Gonzalez, please advise. Thanks 

## 2022-12-10 NOTE — Progress Notes (Signed)
@Patient  ID: Kimberly Patton, female    DOB: 1956-05-04, 67 y.o.   MRN: [id number]  Chief Complaint  Patient presents with   Follow-up    Hx of Afib- HR of 126 after using Wixela. Some SOB in the morning(feels this is related to GERD).     Referring provider: Excell Seltzer, MD  HPI: 67 year old female, never smoked. PMH significant for A-fib, coronary artery disease, hypertension, asthma, thyroid nodule, hypercholesteremia, breast lumpectomy, morbid obesity.   12/10/2022 Patient presents today for follow-up asthma. She reports increase in heart rate after using Wixella. She tried Wixela once and reports tachycardia into 120s after taking medication.  She has since stopped using. Prednisone has caused her to go into afib in the past. Currently has no acute breathing issues. She has some shortness of breath in the morning and intermittent right sided chest tightness. She was started on new GERD medication, after taking medication in the morning she notices some reflux. Belching relieves this. She is walking 1 hour a day in the water without any issues.    Allergies  Allergen Reactions   Wasp Venom Anaphylaxis   Chlorhexidine Gluconate     Itching under breasts   Metronidazole Other (See Comments)    Chest tightness, neck tightness   Prednisone Other (See Comments)    Severe migraine / with taper dose Told by Cardiologist to avoid due to Afib   Other Rash    Bleach   Sodium Hypochlorite Rash    Liquid clorox bleach    Immunization History  Administered Date(s) Administered   Fluad Quad(high Dose 65+) 03/09/2021, 05/12/2022   Influenza Inj Mdck Quad Pf 02/13/2018   Influenza Whole 06/11/2005, 04/08/2009   Influenza,inj,Quad PF,6+ Mos 03/22/2017, 02/13/2018, 03/03/2019, 04/08/2020   Influenza-Unspecified 03/27/2016   PFIZER(Purple Top)SARS-COV-2 Vaccination 08/07/2019, 08/29/2019   PNEUMOCOCCAL CONJUGATE-20 03/09/2021   Pneumococcal Polysaccharide-23 03/22/2017   Td  06/11/2004   Tdap 01/20/2015    Past Medical History:  Diagnosis Date   ALLERGIC RHINITIS 04/03/2007   Allergy    Not sure   Anemia    ((Pt Qnr Sub: Denies at visit from 08/28/2021 with endocrinology))    Anginal pain (HCC)    Anxiety    Arthritis Not sure   Asthma    hx of years ago - no longer a problem    ASTHMA, PERSISTENT, MODERATE 04/03/2007   Cancer (HCC)    skin    Dyspnea    with exertion   Dyspnea on exertion    a. 11/2007 Echo: EF 60%.   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 11/2011 48h Holter: RSR, rare PVC's, occas PAC's   Retinal tear of right eye 01/2016   Thyroid nodule 04/15/2018   Ventral hernia     Tobacco History: Social History   Tobacco Use  Smoking Status Never  Smokeless  Tobacco Never  Tobacco Comments   smoke at age [age]   Counseling given: Not Answered Tobacco comments: smoke at age 38-12   Outpatient Medications Prior to Visit  Medication Sig Dispense Refill   apixaban (ELIQUIS) 5 MG TABS tablet Take 1 tablet (5 mg total) by mouth 2 (two) times daily. 60 tablet 0   Calcium Citrate-Vitamin D (CITRACAL + D PO) Take 2 tablets by mouth daily.      Cholecalciferol (VITAMIN D3) 1.25 MG (50000 UT) CAPS TAKE 1 CAPSULE (50,000 UNITS TOTAL) BY MOUTH ONCE A WEEK. TUESDAY 12 capsule 0   Coenzyme Q10-Vitamin E (QUNOL ULTRA COQ10) 100-150 MG-UNIT CAPS Take 100 mg by mouth daily.     Cyanocobalamin (VITAMIN B-12) 2500 MCG SUBL Place 2,500 mcg under the tongue daily.     diltiazem  (CARDIZEM) 30 MG tablet Take 30 mg by mouth 2 (two) times daily as needed.     gabapentin (NEURONTIN) 100 MG capsule Take 100 mg by mouth daily.     Lactobacillus Rhamnosus, GG, (CULTURELLE IMMUNITY SUPPORT PO) Take 1 capsule by mouth daily.     Menaquinone-7 (VITAMIN K2 PO) Take 100 mg by mouth daily.     methocarbamol (ROBAXIN) 500 MG tablet Take 500 mg by mouth at bedtime as needed for muscle spasms.     montelukast (SINGULAIR) 10 MG tablet TAKE 1 TABLET BY MOUTH EVERYDAY AT BEDTIME 90 tablet 1   Multiple Vitamins-Minerals (CENTRUM SILVER 50+WOMEN PO) Take 1 tablet by mouth daily.     nitroGLYCERIN (NITROSTAT) 0.4 MG SL tablet Place 1 tablet (0.4 mg total) under the tongue every 5 (five) minutes as needed for chest pain. 25 tablet 3   Omega-3 Fatty Acids (FISH OIL) 1200 MG CAPS Take 1,200 mg by mouth daily.     Riboflavin 400 MG CAPS Take 400 mg by mouth daily.     rosuvastatin (CRESTOR) 10 MG tablet Take 1 tablet (10 mg total) by mouth daily. 90 tablet 3   spironolactone (ALDACTONE) 25 MG tablet Take 1 tablet (25 mg total) by mouth daily. 90 tablet 3   traMADol (ULTRAM) 50 MG tablet Take 1 tablet (50 mg total) by mouth every 6 (six) hours as needed. 12 tablet 0   TURMERIC PO Take 2 tablets by mouth daily. 500 mg  With Ginger 50 mg Gummies     verapamil (CALAN-SR) 120 MG CR tablet Take 1 tablet (120 mg total) by mouth 2 (two) times daily. 180 tablet 3   VOQUEZNA 20 MG TABS Take 1 tablet by mouth daily with breakfast.     Zoledronic Acid (RECLAST IV) Inject into the vein. Yearly injection     albuterol (VENTOLIN HFA) 108 (90 Base) MCG/ACT inhaler Inhale 2 puffs into the lungs every 6 (six) hours as needed for wheezing or shortness of breath. 8 g 2   fluticasone-salmeterol (WIXELA INHUB) 250-50 MCG/ACT AEPB Inhale 1 puff into the lungs in the morning and at bedtime. (Patient not taking: Reported on 12/10/2022)     Facility-Administered Medications Prior to Visit  Medication Dose Route Frequency  Provider Last Rate Last Admin   albuterol (PROVENTIL) (2.5 MG/3ML) 0.083% nebulizer solution 2.5 mg  2.5 mg Nebulization Once Salena Saner, MD       Review of Systems  Review of Systems  Constitutional: Negative.   HENT: Negative.    Respiratory:  Positive for chest tightness and shortness of breath. Negative for cough and wheezing.   Cardiovascular: Negative.  Negative for chest pain and  palpitations.   Physical Exam  BP 130/76 (BP Location: Left Wrist, Cuff Size: Normal)   Pulse 82   Temp 98 F (36.7 C) (Temporal)   Ht 5\' 6"  (1.676 m)   Wt 274 lb 12.8 oz (124.6 kg)   SpO2 96%   BMI 44.35 kg/m  Physical Exam Constitutional:      Appearance: Normal appearance.  HENT:     Head: Normocephalic and atraumatic.     Mouth/Throat:     Mouth: Mucous membranes are moist.     Pharynx: Oropharynx is clear.  Cardiovascular:     Rate and Rhythm: Normal rate and regular rhythm.  Pulmonary:     Effort: Pulmonary effort is normal.     Breath sounds: Normal breath sounds. No wheezing, rhonchi or rales.  Musculoskeletal:        General: Normal range of motion.     Cervical back: Normal range of motion and neck supple.  Skin:    General: Skin is warm and dry.  Neurological:     General: No focal deficit present.     Mental Status: She is alert and oriented to person, place, and time. Mental status is at baseline.  Psychiatric:        Mood and Affect: Mood normal.        Behavior: Behavior normal.        Thought Content: Thought content normal.        Judgment: Judgment normal.      Lab Results:  CBC    Component Value Date/Time   WBC 9.6 11/12/2022 0322   RBC 4.91 11/12/2022 0322   HGB 12.9 11/12/2022 0322   HCT 39.9 11/12/2022 0322   PLT 340 11/12/2022 0322   MCV 81.3 11/12/2022 0322   MCH 26.3 11/12/2022 0322   MCHC 32.3 11/12/2022 0322   RDW 15.3 11/12/2022 0322   LYMPHSABS 2.7 11/09/2022 0220   MONOABS 0.5 11/09/2022 0220   EOSABS 0.1 11/09/2022 0220    BASOSABS 0.0 11/09/2022 0220    BMET    Component Value Date/Time   NA 138 11/12/2022 0322   NA 139 01/21/2020 1137   K 4.2 11/12/2022 0322   CL 104 11/12/2022 0322   CO2 26 11/12/2022 0322   GLUCOSE 101 (H) 11/12/2022 0322   BUN 26 (H) 11/12/2022 0322   BUN 16 01/21/2020 1137   CREATININE 0.77 11/12/2022 0322   CREATININE 0.57 01/01/2020 1623   CALCIUM 9.2 11/12/2022 0322   GFRNONAA >60 11/12/2022 0322   GFRAA >60 02/02/2020 1417    BNP No results found for: "BNP"  ProBNP    Component Value Date/Time   PROBNP 40.0 12/07/2011 1053    Imaging: ECHOCARDIOGRAM COMPLETE  Result Date: 11/11/2022    ECHOCARDIOGRAM REPORT   Patient Name:   OLUWADARA PELON Date of Exam: 11/10/2022 Medical Rec #:  [id number]        Height:       66.0 in Accession #:    [id number]       Weight:       273.0 lb Date of Birth:  [birth year]        BSA:          2.283 m Patient Age:    67 years         BP:           152/69 mmHg Patient Gender: F                HR:  111 bpm. Exam Location:  [facility] Procedure: 2D Echo Indications:     Atrial Fibrillation I48.91  History:         Patient has prior history of Echocardiogram examinations, most                  recent 09/28/2021.  Sonographer:     Overton Mam RDCS, FASE Referring Phys:  [id number] Francee Nodal FURTH Diagnosing Phys: Chilton Si MD IMPRESSIONS  1. Left ventricular ejection fraction, by estimation, is 55 to 60%. Left ventricular ejection fraction by PLAX is 58 %. The left ventricle has normal function. The left ventricle has no regional wall motion abnormalities. There is mild concentric left ventricular hypertrophy. Left ventricular diastolic parameters are indeterminate.  2. Right ventricular systolic function is normal. The right ventricular size is normal. There is normal pulmonary artery systolic pressure.  3. The mitral valve is normal in structure. Trivial mitral valve regurgitation. No evidence of mitral stenosis.  4. The aortic valve is  tricuspid. Aortic valve regurgitation is not visualized. No aortic stenosis is present.  5. The inferior vena cava is normal in size with greater than 50% respiratory variability, suggesting right atrial pressure of 3 mmHg. FINDINGS  Left Ventricle: Left ventricular ejection fraction, by estimation, is 55 to 60%. Left ventricular ejection fraction by PLAX is 58 %. The left ventricle has normal function. The left ventricle has no regional wall motion abnormalities. The left ventricular internal cavity size was normal in size. There is mild concentric left ventricular hypertrophy. Left ventricular diastolic parameters are indeterminate. Normal left ventricular filling pressure. Right Ventricle: The right ventricular size is normal. No increase in right ventricular wall thickness. Right ventricular systolic function is normal. There is normal pulmonary artery systolic pressure. The tricuspid regurgitant velocity is 2.33 m/s, and  with an assumed right atrial pressure of 3 mmHg, the estimated right ventricular systolic pressure is 24.7 mmHg. Left Atrium: Left atrial size was normal in size. Right Atrium: Right atrial size was normal in size. Pericardium: There is no evidence of pericardial effusion. Mitral Valve: The mitral valve is normal in structure. Trivial mitral valve regurgitation. No evidence of mitral valve stenosis. Tricuspid Valve: The tricuspid valve is normal in structure. Tricuspid valve regurgitation is trivial. No evidence of tricuspid stenosis. Aortic Valve: The aortic valve is tricuspid. Aortic valve regurgitation is not visualized. No aortic stenosis is present. Aortic valve peak gradient measures 6.2 mmHg. Pulmonic Valve: The pulmonic valve was normal in structure. Pulmonic valve regurgitation is not visualized. No evidence of pulmonic stenosis. Aorta: The aortic root is normal in size and structure. Venous: The inferior vena cava is normal in size with greater than 50% respiratory variability,  suggesting right atrial pressure of 3 mmHg. IAS/Shunts: No atrial level shunt detected by color flow Doppler.  LEFT VENTRICLE PLAX 2D LV EF:         Left            Diastology                ventricular     LV e' medial:    6.42 cm/s                ejection        LV E/e' medial:  9.0                fraction by     LV e' lateral:   7.83 cm/s  PLAX is 58      LV E/e' lateral: 7.4                %. LVIDd:         4.00 cm LVIDs:         2.80 cm LV PW:         1.30 cm LV IVS:        1.30 cm LVOT diam:     2.00 cm LV SV:         52 LV SV Index:   23 LVOT Area:     3.14 cm  RIGHT VENTRICLE RV Basal diam:  3.50 cm LEFT ATRIUM             Index        RIGHT ATRIUM           Index LA diam:        4.10 cm 1.80 cm/m   RA Area:     11.30 cm LA Vol (A2C):   35.9 ml 15.73 ml/m  RA Volume:   24.50 ml  10.73 ml/m LA Vol (A4C):   42.4 ml 18.58 ml/m LA Biplane Vol: 40.6 ml 17.79 ml/m  AORTIC VALVE                 PULMONIC VALVE AV Area (Vmax): 2.11 cm     PV Vmax:       1.06 m/s AV Vmax:        125.00 cm/s  PV Peak grad:  4.5 mmHg AV Peak Grad:   6.2 mmHg LVOT Vmax:      83.80 cm/s LVOT Vmean:     54.600 cm/s LVOT VTI:       0.166 m  AORTA Ao Root diam: 3.10 cm MITRAL VALVE               TRICUSPID VALVE MV Area (PHT): 2.39 cm    TR Peak grad:   21.7 mmHg MV Decel Time: 318 msec    TR Vmax:        233.00 cm/s MV E velocity: 57.60 cm/s                            SHUNTS                            Systemic VTI:  0.17 m                            Systemic Diam: 2.00 cm Chilton Si MD Electronically signed by Chilton Si MD Signature Date/Time: 11/11/2022/8:33:35 AM    Final      Assessment & Plan:   Asthma, mild intermittent - Minimally symptomatic. She experiences shortness of breath in the morning and intermittent chest tightness on right side. FENO 24. No wheezing or cough. Changing Albuterol to Levalbuterol hfa due to afib/tachycardia. Trial Spiriva Respimat 1.35mcg two puffs daily. Continue  Singulair 10mg  at bedtime. FU in 3 months or sooner if needed.   Glenford Bayley, NP 12/10/2022

## 2022-12-10 NOTE — Progress Notes (Signed)
Agree with the details of the visit as noted by Elizabeth Walsh, NP.  C. Laura Majesty Oehlert, MD [facility] 

## 2022-12-10 NOTE — Assessment & Plan Note (Addendum)
-   Minimally symptomatic. She experiences shortness of breath in the morning and intermittent chest tightness on right side. FENO 24. No wheezing or cough. Changing Albuterol to Levalbuterol hfa due to afib/tachycardia. Trial Spiriva Respimat 1.48mcg two puffs daily. Continue Singulair 10mg  at bedtime. FU in 3 months or sooner if needed.

## 2022-12-12 ENCOUNTER — Encounter: Payer: Self-pay | Admitting: Family Medicine

## 2022-12-12 DIAGNOSIS — M810 Age-related osteoporosis without current pathological fracture: Secondary | ICD-10-CM

## 2022-12-12 DIAGNOSIS — E041 Nontoxic single thyroid nodule: Secondary | ICD-10-CM

## 2022-12-14 ENCOUNTER — Encounter: Payer: Self-pay | Admitting: Cardiology

## 2022-12-14 DIAGNOSIS — I480 Paroxysmal atrial fibrillation: Secondary | ICD-10-CM | POA: Diagnosis not present

## 2022-12-17 ENCOUNTER — Telehealth: Payer: Self-pay | Admitting: *Deleted

## 2022-12-17 ENCOUNTER — Other Ambulatory Visit: Payer: Self-pay | Admitting: *Deleted

## 2022-12-17 MED ORDER — APIXABAN 5 MG PO TABS: 5.0000 mg | ORAL_TABLET | Freq: Two times a day (BID) | ORAL | 11 refills | Status: DC

## 2022-12-17 NOTE — Telephone Encounter (Signed)
Does patient need any additional CPE labs??  Looks like most things have been checked recently.

## 2022-12-17 NOTE — Telephone Encounter (Signed)
-----   Message from Alvina Chou sent at 12/17/2022 12:40 PM EDT ----- Regarding: Lab orders for Thursday, 7.18.24 Patient is scheduled for CPX labs, please order future labs, Thanks , Camelia Eng

## 2022-12-18 ENCOUNTER — Other Ambulatory Visit: Payer: HMO

## 2022-12-18 DIAGNOSIS — M7702 Medial epicondylitis, left elbow: Secondary | ICD-10-CM | POA: Diagnosis not present

## 2022-12-18 DIAGNOSIS — M7701 Medial epicondylitis, right elbow: Secondary | ICD-10-CM | POA: Diagnosis not present

## 2022-12-18 DIAGNOSIS — Z0289 Encounter for other administrative examinations: Secondary | ICD-10-CM

## 2022-12-18 NOTE — Telephone Encounter (Signed)
No additional labs needed.  She just needs to do the CTX lab already ordered.

## 2022-12-19 ENCOUNTER — Encounter (INDEPENDENT_AMBULATORY_CARE_PROVIDER_SITE_OTHER): Payer: BC Managed Care – PPO | Admitting: Family Medicine

## 2022-12-19 NOTE — Addendum Note (Signed)
Addended by: Damita Lack on: 12/19/2022 02:24 PM   Modules accepted: Orders

## 2022-12-19 NOTE — Telephone Encounter (Signed)
See MyChart message.  Patient is asking for thyroid labs to be drawn at that lab appointment as well.  Okay to place orders for those?

## 2022-12-19 NOTE — Addendum Note (Signed)
Addended by: Parke Poisson on: 12/19/2022 02:41 PM   Modules accepted: Orders

## 2022-12-19 NOTE — Telephone Encounter (Signed)
Future orders placed in Epic as instructed by Dr. Ermalene Searing.

## 2022-12-21 ENCOUNTER — Encounter: Payer: Self-pay | Admitting: Family Medicine

## 2022-12-27 ENCOUNTER — Other Ambulatory Visit (INDEPENDENT_AMBULATORY_CARE_PROVIDER_SITE_OTHER): Payer: HMO

## 2022-12-27 DIAGNOSIS — E041 Nontoxic single thyroid nodule: Secondary | ICD-10-CM

## 2022-12-27 DIAGNOSIS — I480 Paroxysmal atrial fibrillation: Secondary | ICD-10-CM | POA: Diagnosis not present

## 2022-12-27 DIAGNOSIS — M810 Age-related osteoporosis without current pathological fracture: Secondary | ICD-10-CM | POA: Diagnosis not present

## 2022-12-27 LAB — T3, FREE: T3, Free: 3.5 pg/mL (ref 2.3–4.2)

## 2022-12-27 LAB — T4, FREE: Free T4: 0.84 ng/dL (ref 0.60–1.60)

## 2022-12-27 LAB — TSH: TSH: 2.07 u[IU]/mL (ref 0.35–5.50)

## 2022-12-30 LAB — C-TERMINAL TELOPEPTIDE: C-Telopeptide (CTx): 116 pg/mL

## 2023-01-03 ENCOUNTER — Encounter: Payer: Self-pay | Admitting: Cardiology

## 2023-01-03 ENCOUNTER — Other Ambulatory Visit: Payer: Self-pay | Admitting: Family Medicine

## 2023-01-03 DIAGNOSIS — M25521 Pain in right elbow: Secondary | ICD-10-CM | POA: Diagnosis not present

## 2023-01-03 NOTE — Telephone Encounter (Signed)
Last office visit 08/17/2022 for acute cystitis with Dr. Milinda Antis.  Last refilled 10/10/22 for #12 with no refills.  Vit D Level 03/14/22 which was normal at 54.40 ng/mL. Next Appt: No future appointments with PCP.

## 2023-01-08 ENCOUNTER — Ambulatory Visit (INDEPENDENT_AMBULATORY_CARE_PROVIDER_SITE_OTHER): Payer: HMO | Admitting: Internal Medicine

## 2023-01-08 ENCOUNTER — Encounter (INDEPENDENT_AMBULATORY_CARE_PROVIDER_SITE_OTHER): Payer: Self-pay | Admitting: Internal Medicine

## 2023-01-08 VITALS — BP 137/82 | HR 60 | Temp 97.7°F | Ht 66.0 in | Wt 275.0 lb

## 2023-01-08 DIAGNOSIS — Z1331 Encounter for screening for depression: Secondary | ICD-10-CM

## 2023-01-08 DIAGNOSIS — R5383 Other fatigue: Secondary | ICD-10-CM | POA: Diagnosis not present

## 2023-01-08 DIAGNOSIS — Z9884 Bariatric surgery status: Secondary | ICD-10-CM | POA: Diagnosis not present

## 2023-01-08 DIAGNOSIS — Z6841 Body Mass Index (BMI) 40.0 and over, adult: Secondary | ICD-10-CM | POA: Diagnosis not present

## 2023-01-08 DIAGNOSIS — R7303 Prediabetes: Secondary | ICD-10-CM | POA: Insufficient documentation

## 2023-01-08 DIAGNOSIS — R948 Abnormal results of function studies of other organs and systems: Secondary | ICD-10-CM

## 2023-01-08 DIAGNOSIS — R0602 Shortness of breath: Secondary | ICD-10-CM | POA: Diagnosis not present

## 2023-01-08 HISTORY — DX: Encounter for screening for depression: Z13.31

## 2023-01-08 NOTE — Assessment & Plan Note (Signed)
Patient has a slower than predicted metabolism. IC 1627 vs. calculated 2364. This may contribute to weight gain, chronic fatigue and difficulty losing weight.  This is likely due to decrease muscle mass, metabolic adaptations following gastric bypass, multiple comorbid conditions, low physical activity levels.  She needs to be screened for OSA.    We reviewed measures to improve metabolism including not skipping meals, progressive strengthening exercises, increasing protein intake at every meal and maintaining adequate hydration and sleep.

## 2023-01-08 NOTE — Progress Notes (Unsigned)
Chief Complaint:   OBESITY Kimberly Patton (MR# [id number]) is a 67 y.o. female who presents for evaluation and treatment of obesity and related comorbidities. Current BMI is Body mass index is 44.39 kg/m. Kimberly Patton has been struggling with her weight for many years and has been unsuccessful in either losing weight, maintaining weight loss, or reaching her healthy weight goal.  [name] is currently in the action stage of change and ready to dedicate time achieving and maintaining a healthier weight. Kimberly Patton is interested in becoming our patient and working on intensive lifestyle modifications including (but not limited to) diet and exercise for weight loss.  [name]'s habits were reviewed today and are as follows: Her family eats meals together, she thinks her family will eat healthier with her, her desired weight loss is 75 lbs, she has been heavy most of her life, she started gaining weight after her divorce, her heaviest weight ever was 340 pounds, she has significant food cravings issues, she wakes up frequently in the middle of the night to eat, she skips meals frequently, she frequently eats larger portions than normal, and she struggles with emotional eating.  Depression Screen [name]'s Food and Mood (modified PHQ-9) score was 11.  Subjective:   1. Other fatigue [name] admits to daytime somnolence and admits to waking up still tired. Patient has a history of symptoms of daytime fatigue and morning fatigue. Kimberly Patton generally gets 5 or 7 hours of sleep per night, and states that she has nightime awakenings. Snoring is not present. Apneic episodes are not present. Epworth Sleepiness Score is 4.   2. SOB (shortness of breath) on exertion Kimberly Patton notes increasing shortness of breath with exercising and seems to be worsening over time with weight gain. She notes getting out of breath sooner with activity than she used to. This has not gotten worse recently. Kimberly Patton denies shortness of  breath at rest or orthopnea.  3. Status post laparoscopic sleeve gastrectomy Dec 2014 Presurgical weight 340 pounds, nadir 200 and a year and a half.  Started to regain weight in 2017 after divorce.  4. Prediabetes Patient aware of disease state and risk of progression. This may contribute to abnormal cravings, fatigue and diabetic complications without having diabetes.   Most recent A1c is  Lab Results  Component Value Date   HGBA1C 5.8 (H) 11/09/2022   HGBA1C  12/03/2007    5.5 (NOTE)   The ADA recommends the following therapeutic goal for glycemic   control related to Hgb A1C measurement:   Goal of Therapy:   < 7.0% Hgb A1C   Reference: American Diabetes Association: Clinical Practice   Recommendations 2008, Diabetes Care,  2008, 31:(Suppl 1).   5. Abnormal metabolism Patient has a slower than predicted metabolism. IC 1627 vs. calculated 2364. This may contribute to weight gain, chronic fatigue and difficulty losing weight.  This is likely due to decrease muscle mass, metabolic adaptations following gastric bypass, multiple comorbid conditions, low physical activity levels.  She needs to be screened for OSA.   Assessment/Plan:   1. Other fatigue Winsome does feel that her weight is causing her energy to be lower than it should be. Fatigue may be related to obesity, depression or many other causes. Labs will be ordered, and in the meanwhile, Kimberly Patton will focus on self care including making healthy food choices, increasing physical activity and focusing on stress reduction.  2. SOB (shortness of breath) on exertion Kimberly Patton does feel that she gets out of breath  more easily that she used to when she exercises. Kimberly Patton's shortness of breath appears to be obesity related and exercise induced. She has agreed to work on weight loss and gradually increase exercise to treat her exercise induced shortness of breath. Will continue to monitor closely.  3. Status post laparoscopic sleeve gastrectomy  Dec 2014 Her weight regain is multifactorial and likely secondary to behavioral and metabolic bowel adaptations following gastric bypass.  4. Prediabetes We have discussed treatment options which include: losing 7 to 10% of body weight, increasing physical activity to a goal of 150 minutes a week at moderate intensity. Advised to maintain a diet low on simple and processed carbohydrates. She may also be a candidate for pharmacoprophylaxis with metformin or incretin mimetic.   - Insulin, random - Glucose, fasting  5. Abnormal metabolism We reviewed measures to improve metabolism including not skipping meals, progressive strengthening exercises, increasing protein intake at every meal and maintaining adequate hydration and sleep.   6. Depression screen Kimberly Patton had a positive depression screening. Depression is commonly associated with obesity and often results in emotional eating behaviors. We will monitor this closely and work on CBT to help improve the non-hunger eating patterns. Referral to Psychology may be required if no improvement is seen as she continues in our clinic.  7. Class 3 severe obesity with serious comorbidity and body mass index (BMI) of 40.0 to 44.9 in adult, unspecified obesity type (HCC) Kimberly Patton is currently in the action stage of change and her goal is to continue with weight loss efforts. I recommend [name] begin the structured treatment plan as follows:  She has agreed to the Category 3 Plan.  Exercise goals: All adults should avoid inactivity. Some physical activity is better than none, and adults who participate in any amount of physical activity gain some health benefits.   Behavioral modification strategies: increasing lean protein intake, decreasing simple carbohydrates, increasing vegetables, increasing water intake, increasing high fiber foods, no skipping meals, meal planning and cooking strategies, keeping healthy foods in the home, better snacking choices,  avoiding temptations, and planning for success.  She was informed of the importance of frequent follow-up visits to maximize her success with intensive lifestyle modifications for her multiple health conditions. She was informed we would discuss her lab results at her next visit unless there is a critical issue that needs to be addressed sooner. Airelle agreed to keep her next visit at the agreed upon time to discuss these results.  Objective:   Blood pressure 137/82, pulse 60, temperature 97.7 F (36.5 C), height 5\' 6"  (1.676 m), weight 275 lb (124.7 kg), SpO2 100%. Body mass index is 44.39 kg/m.  EKG: Normal sinus rhythm, rate 59 BPM.  Indirect Calorimeter completed today shows a VO2 of 237 and a REE of 1627.  Her calculated basal metabolic rate is 6962 thus her basal metabolic rate is worse than expected.  General: Cooperative, alert, well developed, in no acute distress. HEENT: Conjunctivae and lids unremarkable. Cardiovascular: Regular rhythm.  Lungs: Normal work of breathing. Neurologic: No focal deficits.   Lab Results  Component Value Date   CREATININE 0.77 11/12/2022   BUN 26 (H) 11/12/2022   NA 138 11/12/2022   K 4.2 11/12/2022   CL 104 11/12/2022   CO2 26 11/12/2022   Lab Results  Component Value Date   ALT 12 08/10/2022   AST 15 08/10/2022   ALKPHOS 68 08/10/2022   BILITOT 0.5 08/10/2022   Lab Results  Component Value Date  HGBA1C 5.8 (H) 11/09/2022   HGBA1C 6.0 05/11/2022   HGBA1C 5.6 02/22/2022   HGBA1C 5.1 08/23/2020   HGBA1C 5.3 07/16/2019   Lab Results  Component Value Date   INSULIN 11.9 01/08/2023   Lab Results  Component Value Date   TSH 2.07 12/27/2022   Lab Results  Component Value Date   CHOL 142 11/10/2022   HDL 60 11/10/2022   LDLCALC 56 11/10/2022   TRIG 132 11/10/2022   CHOLHDL 2.4 11/10/2022   Lab Results  Component Value Date   WBC 9.6 11/12/2022   HGB 12.9 11/12/2022   HCT 39.9 11/12/2022   MCV 81.3 11/12/2022   PLT 340  11/12/2022   Lab Results  Component Value Date   IRON 51 08/23/2020   TIBC 496 (H) 01/10/2020   FERRITIN 5.3 (L) 08/23/2020   Attestation Statements:   Reviewed by clinician on day of visit: allergies, medications, problem list, medical history, surgical history, family history, social history, and previous encounter notes.  Time spent on visit including pre-visit chart review and post-visit charting and care was 40 minutes.   Trude Mcburney, am acting as transcriptionist for Worthy Rancher, MD.  I have reviewed the above documentation for accuracy and completeness, and I agree with the above. -Worthy Rancher, MD

## 2023-01-08 NOTE — Assessment & Plan Note (Signed)
Presurgical weight 340 pounds, nadir 200 and a year and a half.  Started to regain weight in 2017 after divorce.  Her weight regain is multifactorial and likely secondary to behavioral and metabolic bowel adaptations following gastric bypass.

## 2023-01-08 NOTE — Assessment & Plan Note (Signed)
Most recent A1c is  Lab Results  Component Value Date   HGBA1C 5.8 (H) 11/09/2022   HGBA1C  12/03/2007    5.5 (NOTE)   The ADA recommends the following therapeutic goal for glycemic   control related to Hgb A1C measurement:   Goal of Therapy:   < 7.0% Hgb A1C   Reference: American Diabetes Association: Clinical Practice   Recommendations 2008, Diabetes Care,  2008, 31:(Suppl 1).    Patient aware of disease state and risk of progression. This may contribute to abnormal cravings, fatigue and diabetic complications without having diabetes.   We have discussed treatment options which include: losing 7 to 10% of body weight, increasing physical activity to a goal of 150 minutes a week at moderate intensity.  Advised to maintain a diet low on simple and processed carbohydrates.  She may also be a candidate for pharmacoprophylaxis with metformin or incretin mimetic.

## 2023-01-10 DIAGNOSIS — M25521 Pain in right elbow: Secondary | ICD-10-CM | POA: Diagnosis not present

## 2023-01-14 ENCOUNTER — Telehealth (INDEPENDENT_AMBULATORY_CARE_PROVIDER_SITE_OTHER): Payer: Self-pay

## 2023-01-14 NOTE — Telephone Encounter (Signed)
Pt called regarding depression screening and prediabetes in her chart.  I explained that the prediabetes was checked on her past medical history form (in new patient paperwork) and that it states: depression screening (completed by patient in new patient paperwork) and not a diagnosis.

## 2023-01-16 ENCOUNTER — Ambulatory Visit: Payer: HMO | Admitting: Cardiology

## 2023-01-22 ENCOUNTER — Ambulatory Visit (INDEPENDENT_AMBULATORY_CARE_PROVIDER_SITE_OTHER): Payer: HMO | Admitting: Internal Medicine

## 2023-01-22 ENCOUNTER — Encounter (INDEPENDENT_AMBULATORY_CARE_PROVIDER_SITE_OTHER): Payer: Self-pay | Admitting: Internal Medicine

## 2023-01-22 VITALS — BP 136/79 | HR 58 | Temp 98.0°F | Ht 66.0 in | Wt 273.0 lb

## 2023-01-22 DIAGNOSIS — Z9189 Other specified personal risk factors, not elsewhere classified: Secondary | ICD-10-CM | POA: Insufficient documentation

## 2023-01-22 DIAGNOSIS — R7303 Prediabetes: Secondary | ICD-10-CM

## 2023-01-22 DIAGNOSIS — Z6841 Body Mass Index (BMI) 40.0 and over, adult: Secondary | ICD-10-CM

## 2023-01-22 DIAGNOSIS — Z9884 Bariatric surgery status: Secondary | ICD-10-CM

## 2023-01-22 DIAGNOSIS — R948 Abnormal results of function studies of other organs and systems: Secondary | ICD-10-CM

## 2023-01-22 NOTE — Assessment & Plan Note (Signed)
Most recent A1c is  Lab Results  Component Value Date   HGBA1C 5.8 (H) 11/09/2022   HGBA1C  12/03/2007    5.5 (NOTE)   The ADA recommends the following therapeutic goal for glycemic   control related to Hgb A1C measurement:   Goal of Therapy:   < 7.0% Hgb A1C   Reference: American Diabetes Association: Clinical Practice   Recommendations 2008, Diabetes Care,  2008, 31:(Suppl 1).    Patient aware of disease state and risk of progression. This may contribute to abnormal cravings, fatigue and diabetic complications without having diabetes.   We have discussed treatment options which include: losing 7 to 10% of body weight, increasing physical activity to a goal of 150 minutes a week at moderate intensity.  Advised to maintain a diet low on simple and processed carbohydrates.  She may also be a candidate for pharmacoprophylaxis with metformin or incretin mimetic.  We reviewed benefits and side effects of metformin.  We will hold off starting medication.

## 2023-01-22 NOTE — Assessment & Plan Note (Signed)
Reviewed most recent labs.  Her cardiovascular risk is estimated to be 8.7%.  She is currently on moderate dose statin therapy with rosuvastatin.  Improving blood pressure control may reduce her risk.  She will continue on current dose.  Losing 10% of body weight may improve blood pressure control.

## 2023-01-22 NOTE — Assessment & Plan Note (Signed)
 See obesity treatment plan

## 2023-01-22 NOTE — Progress Notes (Signed)
Office: [phone]  /  Fax: [phone]  WEIGHT SUMMARY AND BIOMETRICS  Vitals Temp: 98 F (36.7 C) BP: 136/79 Pulse Rate: (!) 58 SpO2: 100 %   Anthropometric Measurements Height: 5\' 6"  (1.676 m) Weight: 273 lb (123.8 kg) BMI (Calculated): 44.08 Weight at Last Visit: 275 lb Weight Lost Since Last Visit: 2 lb Weight Gained Since Last Visit: 0 lb Starting Weight: 275 lb Total Weight Loss (lbs): 2 lb (0.907 kg) Peak Weight: 340 lb   Body Composition  Body Fat %: 46.1 % Fat Mass (lbs): 126.2 lbs Muscle Mass (lbs): 140.2 lbs Total Body Water (lbs): 92.4 lbs Visceral Fat Rating : 17    RMR: 1627  Today's Visit #: 2  Starting Date: 01/08/23   HPI  Chief Complaint: OBESITY  Kimberly Patton is here to discuss her progress with her obesity treatment plan. She is on the 1500 calories and 100 grams of protein daily and states she is following her eating plan approximately 90 % of the time. She states she is walking more.  Interval History:  Since last office visit she has lost 3 lbs. She reports good adherence to reduced calorie nutritional plan. She has been working on not skipping meals, increasing protein intake at every meal, drinking more water, avoiding and / or reducing liquid calories, journaling and tracking calories, and making healthier choices  Orixegenic Control: Denies problems with appetite and hunger signals.  Denies problems with satiety and satiation.  Denies problems with eating patterns and portion control.  Denies abnormal cravings. Denies feeling deprived or restricted.   Barriers identified: orthopedic problems, medical conditions or chronic pain affecting mobility and slow metabolism for age.   Pharmacotherapy for weight loss: She is currently taking no anti-obesity medication.    ASSESSMENT AND PLAN  TREATMENT PLAN FOR OBESITY:  Recommended Dietary Goals  Kimberly Patton is currently in the action stage of change. As such, her goal is to  continue weight management plan. She has agreed to: continue current plan  Behavioral Intervention  We discussed the following Behavioral Modification Strategies today: increasing lean protein intake, decreasing simple carbohydrates , increasing vegetables, increasing lower glycemic fruits, increasing water intake, continue to practice mindfulness when eating, and planning for success.  Additional resources provided today: None  Recommended Physical Activity Goals  Kimberly Patton has been advised to work up to 150 minutes of moderate intensity aerobic activity a week and strengthening exercises 2-3 times per week for cardiovascular health, weight loss maintenance and preservation of muscle mass.   She has agreed to :  Think about ways to increase daily physical activity and overcoming barriers to exercise and Increase physical activity in their day and reduce sedentary time (increase NEAT).  Pharmacotherapy We discussed various medication options to help Kimberly Patton with her weight loss efforts and we both agreed to : continue with nutritional and behavioral strategies  ASSOCIATED CONDITIONS ADDRESSED TODAY  Prediabetes Assessment & Plan: Most recent A1c is  Lab Results  Component Value Date   HGBA1C 5.8 (H) 11/09/2022   HGBA1C  12/03/2007    5.5 (NOTE)   The ADA recommends the following therapeutic goal for glycemic   control related to Hgb A1C measurement:   Goal of Therapy:   < 7.0% Hgb A1C   Reference: American Diabetes Association: Clinical Practice   Recommendations 2008, Diabetes Care,  2008, 31:(Suppl 1).    Patient aware of disease state and risk of progression. This may contribute to abnormal cravings, fatigue and diabetic complications without having diabetes.  We have discussed treatment options which include: losing 7 to 10% of body weight, increasing physical activity to a goal of 150 minutes a week at moderate intensity.  Advised to maintain a diet low on simple and  processed carbohydrates.  She may also be a candidate for pharmacoprophylaxis with metformin or incretin mimetic.  We reviewed benefits and side effects of metformin.  We will hold off starting medication.    Abnormal metabolism Assessment & Plan: Patient has a slower than predicted metabolism. IC 1627 vs. calculated 2364. This may contribute to weight gain, chronic fatigue and difficulty losing weight.  This is likely due to decrease muscle mass, metabolic adaptations following gastric bypass, multiple comorbid conditions.  We will screen for OSA at the next office visit  We reviewed measures to improve metabolism including not skipping meals, progressive strengthening exercises, increasing protein intake at every meal and maintaining adequate hydration and sleep.     Class 3 severe obesity with serious comorbidity and body mass index (BMI) of 40.0 to 44.9 in adult, unspecified obesity type [facility]) Assessment & Plan: See obesity treatment plan   Status post laparoscopic sleeve gastrectomy Dec 2014 Assessment & Plan: Presurgical weight 340 pounds, nadir 200 and a year and a half.  Started to regain weight in 2017 after divorce.  Her weight regain is multifactorial and likely secondary to behavioral and metabolic maladaptations following gastric bypass.   At increased risk for cardiovascular disease Assessment & Plan: Reviewed most recent labs.  Her cardiovascular risk is estimated to be 8.7%.  She is currently on moderate dose statin therapy with rosuvastatin.  Improving blood pressure control may reduce her risk.  She will continue on current dose.  Losing 10% of body weight may improve blood pressure control.     PHYSICAL EXAM:  Blood pressure 136/79, pulse (!) 58, temperature 98 F (36.7 C), height 5\' 6"  (1.676 m), weight 273 lb (123.8 kg), SpO2 100%. Body mass index is 44.06 kg/m.  General: She is overweight, cooperative, alert, well developed, and in no acute  distress. PSYCH: Has normal mood, affect and thought process.   HEENT: EOMI, sclerae are anicteric. Lungs: Normal breathing effort, no conversational dyspnea. Extremities: No edema.  Neurologic: No gross sensory or motor deficits. No tremors or fasciculations noted.    DIAGNOSTIC DATA REVIEWED:  BMET    Component Value Date/Time   NA 138 11/12/2022 0322   NA 139 01/21/2020 1137   K 4.2 11/12/2022 0322   CL 104 11/12/2022 0322   CO2 26 11/12/2022 0322   GLUCOSE 101 (H) 11/12/2022 0322   BUN 26 (H) 11/12/2022 0322   BUN 16 01/21/2020 1137   CREATININE 0.77 11/12/2022 0322   CREATININE 0.57 01/01/2020 1623   CALCIUM 9.2 11/12/2022 0322   GFRNONAA >60 11/12/2022 0322   GFRAA >60 02/02/2020 1417   Lab Results  Component Value Date   HGBA1C 5.8 (H) 11/09/2022   HGBA1C  12/03/2007    5.5 (NOTE)   The ADA recommends the following therapeutic goal for glycemic   control related to Hgb A1C measurement:   Goal of Therapy:   < 7.0% Hgb A1C   Reference: American Diabetes Association: Clinical Practice   Recommendations 2008, Diabetes Care,  2008, 31:(Suppl 1).   Lab Results  Component Value Date   INSULIN 11.9 01/08/2023   Lab Results  Component Value Date   TSH 2.07 12/27/2022   CBC    Component Value Date/Time   WBC 9.6 11/12/2022 0322  RBC 4.91 11/12/2022 0322   HGB 12.9 11/12/2022 0322   HCT 39.9 11/12/2022 0322   PLT 340 11/12/2022 0322   MCV 81.3 11/12/2022 0322   MCH 26.3 11/12/2022 0322   MCHC 32.3 11/12/2022 0322   RDW 15.3 11/12/2022 0322   Iron Studies    Component Value Date/Time   IRON 51 08/23/2020 0801   TIBC 496 (H) 01/10/2020 1404   FERRITIN 5.3 (L) 08/23/2020 0801   IRONPCTSAT 10.3 (L) 08/23/2020 0801   IRONPCTSAT 5 (L) 05/28/2017 1647   Lipid Panel     Component Value Date/Time   CHOL 142 11/10/2022 0350   CHOL 168 07/31/2021 0905   TRIG 132 11/10/2022 0350   HDL 60 11/10/2022 0350   HDL 67 07/31/2021 0905   CHOLHDL 2.4 11/10/2022 0350    VLDL 26 11/10/2022 0350   LDLCALC 56 11/10/2022 0350   LDLCALC 85 07/31/2021 0905   Hepatic Function Panel     Component Value Date/Time   PROT 6.6 08/10/2022 0750   ALBUMIN 3.9 08/10/2022 0750   AST 15 08/10/2022 0750   ALT 12 08/10/2022 0750   ALKPHOS 68 08/10/2022 0750   BILITOT 0.5 08/10/2022 0750   BILIDIR 0.1 12/11/2019 0852   IBILI 0.6 12/11/2019 0852      Component Value Date/Time   TSH 2.07 12/27/2022 0729   Nutritional Lab Results  Component Value Date   VD25OH 54.40 03/14/2022   VD25OH 33.36 09/30/2017   VD25OH 32.96 06/12/2017   The 10-year ASCVD risk score (Arnett DK, et al., 2019) is: 8.7%   Return in about 3 weeks (around 02/12/2023) for For Weight Mangement with Dr. Rikki Spearing.Marland Kitchen She was informed of the importance of frequent follow up visits to maximize her success with intensive lifestyle modifications for her multiple health conditions.   ATTESTASTION STATEMENTS:  Reviewed by clinician on day of visit: allergies, medications, problem list, medical history, surgical history, family history, social history, and previous encounter notes.     Worthy Rancher, MD

## 2023-01-22 NOTE — Assessment & Plan Note (Signed)
Patient has a slower than predicted metabolism. IC 1627 vs. calculated 2364. This may contribute to weight gain, chronic fatigue and difficulty losing weight.  This is likely due to decrease muscle mass, metabolic adaptations following gastric bypass, multiple comorbid conditions.  We will screen for OSA at the next office visit  We reviewed measures to improve metabolism including not skipping meals, progressive strengthening exercises, increasing protein intake at every meal and maintaining adequate hydration and sleep.

## 2023-01-22 NOTE — Assessment & Plan Note (Signed)
Presurgical weight 340 pounds, nadir 200 and a year and a half.  Started to regain weight in 2017 after divorce.  Her weight regain is multifactorial and likely secondary to behavioral and metabolic maladaptations following gastric bypass.

## 2023-01-23 DIAGNOSIS — M25521 Pain in right elbow: Secondary | ICD-10-CM | POA: Diagnosis not present

## 2023-01-23 DIAGNOSIS — M79642 Pain in left hand: Secondary | ICD-10-CM | POA: Diagnosis not present

## 2023-01-23 DIAGNOSIS — M79641 Pain in right hand: Secondary | ICD-10-CM | POA: Diagnosis not present

## 2023-01-29 ENCOUNTER — Encounter: Payer: Self-pay | Admitting: Family Medicine

## 2023-01-29 ENCOUNTER — Ambulatory Visit (INDEPENDENT_AMBULATORY_CARE_PROVIDER_SITE_OTHER): Payer: HMO | Admitting: Family Medicine

## 2023-01-29 VITALS — BP 104/62 | HR 79 | Temp 98.5°F | Ht 66.0 in | Wt 275.1 lb

## 2023-01-29 DIAGNOSIS — I480 Paroxysmal atrial fibrillation: Secondary | ICD-10-CM

## 2023-01-29 DIAGNOSIS — R42 Dizziness and giddiness: Secondary | ICD-10-CM | POA: Diagnosis not present

## 2023-01-29 LAB — COMPREHENSIVE METABOLIC PANEL
ALT: 14 U/L (ref 0–35)
AST: 18 U/L (ref 0–37)
Albumin: 4.3 g/dL (ref 3.5–5.2)
Alkaline Phosphatase: 70 U/L (ref 39–117)
BUN: 20 mg/dL (ref 6–23)
CO2: 28 mEq/L (ref 19–32)
Calcium: 9.6 mg/dL (ref 8.4–10.5)
Chloride: 104 mEq/L (ref 96–112)
Creatinine, Ser: 0.58 mg/dL (ref 0.40–1.20)
GFR: 93.75 mL/min (ref >60.00)
Glucose, Bld: 92 mg/dL (ref 70–99)
Potassium: 4.3 mEq/L (ref 3.5–5.1)
Sodium: 140 mEq/L (ref 135–145)
Total Bilirubin: 0.4 mg/dL (ref 0.2–1.2)
Total Protein: 7.5 g/dL (ref 6.0–8.3)

## 2023-01-29 LAB — MAGNESIUM: Magnesium: 2.2 mg/dL (ref 1.5–2.5)

## 2023-01-29 MED ORDER — FLUTICASONE PROPIONATE 50 MCG/ACT NA SUSP: 2.0000 | Freq: Every day | NASAL | 6 refills | Status: DC

## 2023-01-29 NOTE — Progress Notes (Signed)
Patient ID: Kimberly Patton, female    DOB: 07-Jun-1956, 67 y.o.   MRN: [id number]  This visit was conducted in person.  BP 104/62 (BP Location: Left Arm, Patient Position: Sitting, Cuff Size: Large)   Pulse 79   Temp 98.5 F (36.9 C) (Temporal)   Ht 5\' 6"  (1.676 m)   Wt 275 lb 2 oz (124.8 kg)   SpO2 97%   BMI 44.41 kg/m    CC:  Chief Complaint  Patient presents with   Dizziness   Slow Metabolism    Subjective:   HPI: Kimberly Patton is a 67 y.o. female presenting on 01/29/2023 for Dizziness and Slow Metabolism  She presents today noting persistent dizziness.  Reviewed recent office visit note from Dr. Rikki Spearing [facility] January 22, 2023  She has been told she an abnormal metabolism. Has recommended high protein diet 1500 calories per day.  Going to gym working out, 15 min treadmill, then strengthening exercise.  Lab Results  Component Value Date   HGBA1C 5.8 (H) 11/09/2022  Nml thyroid testing in July. June BMET wnl.. glucose 101   Paroxsysmal afib.Marland Kitchen on  Eliquis... spells seem to be related to steroid.  Has upcoming OV with cardiology  thisweek. Monitor also showed PSVT.  She has noted some dizziness off and on in last week... she wonders if she is low on potassium and magnesium.  Has been having lower HRs.Marland Kitchen occ down 48 at night when sleeping.  59-61.  When she is up doing activity 120-130.  She noted the dizziness of and on during the day at rest.   Now on Voquenza for GERD.Marland Kitchen  in last 2 months.  Relevant past medical, surgical, family and social history reviewed and updated as indicated. Interim medical history since our last visit reviewed. Allergies and medications reviewed and updated. Outpatient Medications Prior to Visit  Medication Sig Dispense Refill   apixaban (ELIQUIS) 5 MG TABS tablet Take 1 tablet (5 mg total) by mouth 2 (two) times daily. 60 tablet 11   Calcium Citrate-Vitamin D (CITRACAL + D PO) Take 2 tablets by  mouth daily.      Cholecalciferol (VITAMIN D3) 1.25 MG (50000 UT) CAPS TAKE 1 CAPSULE (50,000 UNITS TOTAL) BY MOUTH ONCE A WEEK. TUESDAY 12 capsule 0   Coenzyme Q10-Vitamin E (QUNOL ULTRA COQ10) 100-150 MG-UNIT CAPS Take 100 mg by mouth daily.     Cyanocobalamin (VITAMIN B-12) 2500 MCG SUBL Place 2,500 mcg under the tongue daily.     diltiazem (CARDIZEM) 30 MG tablet Take 30 mg by mouth 2 (two) times daily as needed.     gabapentin (NEURONTIN) 100 MG capsule Take 100 mg by mouth daily.     Lactobacillus Rhamnosus, GG, (CULTURELLE IMMUNITY SUPPORT PO) Take 1 capsule by mouth daily.     levalbuterol (XOPENEX HFA) 45 MCG/ACT inhaler Inhale 2 puffs into the lungs every 6 (six) hours as needed for wheezing. 1 each 2   Menaquinone-7 (VITAMIN K2 PO) Take 100 mg by mouth daily.     methocarbamol (ROBAXIN) 500 MG tablet Take 500 mg by mouth at bedtime as needed for muscle spasms.     montelukast (SINGULAIR) 10 MG tablet TAKE 1 TABLET BY MOUTH EVERYDAY AT BEDTIME 90 tablet 1   Multiple Vitamins-Minerals (CENTRUM SILVER 50+WOMEN PO) Take 1 tablet by mouth daily.     nitroGLYCERIN (NITROSTAT) 0.4 MG SL tablet Place 1 tablet (0.4 mg total) under the tongue every 5 (five) minutes  as needed for chest pain. 25 tablet 3   Riboflavin 400 MG CAPS Take 400 mg by mouth daily.     rosuvastatin (CRESTOR) 10 MG tablet Take 1 tablet (10 mg total) by mouth daily. 90 tablet 3   spironolactone (ALDACTONE) 25 MG tablet Take 1 tablet (25 mg total) by mouth daily. 90 tablet 3   traMADol (ULTRAM) 50 MG tablet Take 1 tablet (50 mg total) by mouth every 6 (six) hours as needed. 12 tablet 0   TURMERIC PO Take 2 tablets by mouth daily. 500 mg  With Ginger 50 mg Gummies     verapamil (CALAN-SR) 120 MG CR tablet Take 1 tablet (120 mg total) by mouth 2 (two) times daily. 180 tablet 3   VOQUEZNA 10 MG TABS Take 1 tablet by mouth daily.     Zoledronic Acid (RECLAST IV) Inject into the vein. Yearly injection     VOQUEZNA 20 MG TABS  Take 1 tablet by mouth daily with breakfast. Pt currently taking 10 mg daily     Facility-Administered Medications Prior to Visit  Medication Dose Route Frequency Provider Last Rate Last Admin   albuterol (PROVENTIL) (2.5 MG/3ML) 0.083% nebulizer solution 2.5 mg  2.5 mg Nebulization Once Kimberly Saner, MD         Per HPI unless specifically indicated in ROS section below Review of Systems  Constitutional:  Negative for fatigue and fever.  HENT:  Negative for congestion.   Eyes:  Negative for pain.  Respiratory:  Negative for cough and shortness of breath.   Cardiovascular:  Negative for chest pain, palpitations and leg swelling.  Gastrointestinal:  Negative for abdominal pain.  Genitourinary:  Negative for dysuria and vaginal bleeding.  Musculoskeletal:  Negative for back pain.  Neurological:  Positive for dizziness. Negative for syncope, light-headedness and headaches.  Psychiatric/Behavioral:  Negative for dysphoric mood.    Objective:  BP 104/62 (BP Location: Left Arm, Patient Position: Sitting, Cuff Size: Large)   Pulse 79   Temp 98.5 F (36.9 C) (Temporal)   Ht 5\' 6"  (1.676 m)   Wt 275 lb 2 oz (124.8 kg)   SpO2 97%   BMI 44.41 kg/m   Wt Readings from Last 3 Encounters:  02/01/23 274 lb (124.3 kg)  01/29/23 275 lb 2 oz (124.8 kg)  01/22/23 273 lb (123.8 kg)      Physical Exam Constitutional:      General: She is not in acute distress.    Appearance: Normal appearance. She is well-developed. She is obese. She is not ill-appearing or toxic-appearing.  HENT:     Head: Normocephalic.     Right Ear: Hearing, tympanic membrane, ear canal and external ear normal. Tympanic membrane is not erythematous, retracted or bulging.     Left Ear: Hearing, tympanic membrane, ear canal and external ear normal. Tympanic membrane is not erythematous, retracted or bulging.     Nose: No mucosal edema or rhinorrhea.     Right Sinus: No maxillary sinus tenderness or frontal sinus  tenderness.     Left Sinus: No maxillary sinus tenderness or frontal sinus tenderness.     Mouth/Throat:     Mouth: Oropharynx is clear and moist and mucous membranes are normal.     Pharynx: Uvula midline.  Eyes:     General: Lids are normal. Lids are everted, no foreign bodies appreciated.     Extraocular Movements: EOM normal.     Conjunctiva/sclera: Conjunctivae normal.     Pupils: Pupils are equal,  round, and reactive to light.  Neck:     Thyroid: No thyroid mass or thyromegaly.     Vascular: No carotid bruit.     Trachea: Trachea normal.  Cardiovascular:     Rate and Rhythm: Normal rate and regular rhythm.     Pulses: Normal pulses.     Heart sounds: Normal heart sounds, S1 normal and S2 normal. No murmur heard.    No friction rub. No gallop.  Pulmonary:     Effort: Pulmonary effort is normal. No tachypnea or respiratory distress.     Breath sounds: Normal breath sounds. No decreased breath sounds, wheezing, rhonchi or rales.  Abdominal:     General: Bowel sounds are normal.     Palpations: Abdomen is soft.     Tenderness: There is no abdominal tenderness.  Musculoskeletal:     Cervical back: Normal range of motion and neck supple.  Skin:    General: Skin is warm, dry and intact.     Findings: No rash.  Neurological:     Mental Status: She is alert.  Psychiatric:        Mood and Affect: Mood is not anxious or depressed.        Speech: Speech normal.        Behavior: Behavior normal. Behavior is cooperative.        Thought Content: Thought content normal.        Cognition and Memory: Cognition and memory normal.        Judgment: Judgment normal.       Results for orders placed or performed in visit on 01/29/23  Comprehensive metabolic panel  Result Value Ref Range   Sodium 140 135 - 145 mEq/L   Potassium 4.3 3.5 - 5.1 mEq/L   Chloride 104 96 - 112 mEq/L   CO2 28 19 - 32 mEq/L   Glucose, Bld 92 70 - 99 mg/dL   BUN 20 6 - 23 mg/dL   Creatinine, Ser 0.98 0.40 -  1.20 mg/dL   Total Bilirubin 0.4 0.2 - 1.2 mg/dL   Alkaline Phosphatase 70 39 - 117 U/L   AST 18 0 - 37 U/L   ALT 14 0 - 35 U/L   Total Protein 7.5 6.0 - 8.3 g/dL   Albumin 4.3 3.5 - 5.2 g/dL   GFR 11.91 >47.82 mL/min   Calcium 9.6 8.4 - 10.5 mg/dL  Magnesium  Result Value Ref Range   Magnesium 2.2 1.5 - 2.5 mg/dL   *Note: Due to a large number of results and/or encounters for the requested time period, some results have not been displayed. A complete set of results can be found in Results Review.    Assessment and Plan  Dizziness Assessment & Plan: Acute, possibly secondary to electrolyte imbalance.  Will evaluate with complete metabolic panel and magnesium. May also be secondary to fluctuating heart rate.  No recurrent episodes of atrial fibrillation. Of note normal thyroid testing in July.  Blood sugar normal on June basic metabolic panel.  Hemoglobin A1c in prediabetes range.  Orders: -     Comprehensive metabolic panel -     Magnesium  Paroxysmal atrial fibrillation (HCC) Assessment & Plan: Chronic, intermittent Episodes of atrial fibrillation seem to be related to steroid use. She has scheduled follow-up with cardiology. Continue Eliquis anticoagulation.   Other orders -     Fluticasone Propionate; Place 2 sprays into both nostrils daily.  Dispense: 16 g; Refill: 6    No follow-ups on file.  Kerby Nora, MD

## 2023-01-30 DIAGNOSIS — M79641 Pain in right hand: Secondary | ICD-10-CM | POA: Diagnosis not present

## 2023-01-30 DIAGNOSIS — M79642 Pain in left hand: Secondary | ICD-10-CM | POA: Diagnosis not present

## 2023-01-30 DIAGNOSIS — M25521 Pain in right elbow: Secondary | ICD-10-CM | POA: Diagnosis not present

## 2023-02-01 ENCOUNTER — Encounter: Payer: Self-pay | Admitting: Cardiology

## 2023-02-01 ENCOUNTER — Ambulatory Visit: Payer: HMO | Attending: Cardiology | Admitting: Cardiology

## 2023-02-01 ENCOUNTER — Encounter (INDEPENDENT_AMBULATORY_CARE_PROVIDER_SITE_OTHER): Payer: Self-pay | Admitting: Internal Medicine

## 2023-02-01 VITALS — BP 128/84 | HR 64 | Ht 66.0 in | Wt 274.0 lb

## 2023-02-01 DIAGNOSIS — I480 Paroxysmal atrial fibrillation: Secondary | ICD-10-CM | POA: Diagnosis not present

## 2023-02-01 DIAGNOSIS — I10 Essential (primary) hypertension: Secondary | ICD-10-CM

## 2023-02-01 DIAGNOSIS — I471 Supraventricular tachycardia, unspecified: Secondary | ICD-10-CM

## 2023-02-01 NOTE — Patient Instructions (Signed)
 Medication Instructions:  Your physician recommends that you continue on your current medications as directed. Please refer to the Current Medication list given to you today.  *If you need a refill on your cardiac medications before your next appointment, please call your pharmacy*  Follow-Up: At [facility], you and your health needs are our priority.  As part of our continuing mission to provide you with exceptional heart care, we have created designated Provider Care Teams.  These Care Teams include your primary Cardiologist (physician) and Advanced Practice Providers (APPs -  Physician Assistants and Nurse Practitioners) who all work together to provide you with the care you need, when you need it.    Your next appointment:   1 year  Provider:   Sherie Don, NP

## 2023-02-01 NOTE — Progress Notes (Signed)
Electrophysiology Office Follow up Visit Note:    Date:  02/01/2023   ID:  Kimberly Patton, DOB 15-Jul-1955, MRN [id number]  PCP:  Excell Seltzer, MD  [facility] Cardiologist:  Debbe Odea, MD  [facility] Electrophysiologist:  Lanier Prude, MD    Interval History:    Kimberly Patton is a 67 y.o. female who presents for a follow up visit.   Last seen April 20, 2020 for SV T.  Her medical history includes hypertension, hiatal hernia, GERD, iron deficiency anemia and SVT.  At the time of the last appointment, the burden of atrial arrhythmias was low and antiarrhythmic therapy was not recommended.  She was continued on her Cardizem.  She saw Dr. Azucena Cecil on December 10, 2022.  She was diagnosed with atrial fibrillation in May of this year.  She was started on Eliquis twice daily.  The episode of atrial fibrillation was in the setting of steroids for joint pain.   She is working with the healthy living center and Dr. Rikki Spearing.  She is concerned that her metabolism may be low.  She has gained 70 pounds in 3 years.  She says she eats 1300 cal in a day.    Past medical, surgical, social and family history were reviewed.  ROS:   Please see the history of present illness.    All other systems reviewed and are negative.  EKGs/Labs/Other Studies Reviewed:    The following studies were reviewed today:  December 31, 2022 ZIO monitor 24 SVT, longest 12.6 seconds with an average rate of 144 bpm Rare supraventricular and ventricular ectopy.  No atrial fibrillation.  November 11, 2022 echo EF 55 to 60% RV function normal Trivial MR  Nov 09, 2022 EKG Atrial fibrillation with rapid ventricular rate    EKG Interpretation Date/Time:  Friday February 01 2023 11:20:24 EDT Ventricular Rate:  64 PR Interval:  180 QRS Duration:  98 QT Interval:  398 QTC Calculation: 410 R Axis:   -15  Text Interpretation: Normal sinus rhythm Incomplete right bundle branch block Confirmed by  Steffanie Dunn [id number]) on 02/01/2023 11:39:48 AM    Physical Exam:    VS:  BP 128/84 (BP Location: Left Arm, Patient Position: Sitting)   Pulse 64   Ht 5\' 6"  (1.676 m)   Wt 274 lb (124.3 kg)   SpO2 97%   BMI 44.22 kg/m     Wt Readings from Last 3 Encounters:  02/01/23 274 lb (124.3 kg)  01/29/23 275 lb 2 oz (124.8 kg)  01/22/23 273 lb (123.8 kg)     GEN:  Well nourished, well developed in no acute distress CARDIAC: RRR, no murmurs, rubs, gallops RESPIRATORY:  Clear to auscultation without rales, wheezing or rhonchi       ASSESSMENT:    1. Paroxysmal A-fib (HCC)   2. Primary hypertension   3. SVT (supraventricular tachycardia)    PLAN:    In order of problems listed above:   #Atrial tachycardia #Paroxysmal atrial fibrillation On Eliquis for stroke prophylaxis. Continue verapamil Discussed avoiding triggers for her atrial fibrillation.  #Hypertension At goal today.  Recommend checking blood pressures 1-2 times per week at home and recording the values.  Recommend bringing these recordings to the primary care physician.  #Obesity Weight loss discussed today.  Encouraged her to see a active. I would not recommend a stimulant medication given its potential to trigger her atrial fibrillation and rapid ventricular rates.  She will discuss alternatives with her weight loss  physician.  Follow-up 1 year with APP   Signed, Steffanie Dunn, MD, [facility], [facility] 02/01/2023 11:51 AM    Electrophysiology [facility]

## 2023-02-12 ENCOUNTER — Other Ambulatory Visit (INDEPENDENT_AMBULATORY_CARE_PROVIDER_SITE_OTHER): Payer: Self-pay | Admitting: Nurse Practitioner

## 2023-02-12 DIAGNOSIS — I999 Unspecified disorder of circulatory system: Secondary | ICD-10-CM

## 2023-02-13 ENCOUNTER — Encounter: Payer: Self-pay | Admitting: Family Medicine

## 2023-02-13 ENCOUNTER — Encounter: Payer: Self-pay | Admitting: Cardiology

## 2023-02-13 ENCOUNTER — Ambulatory Visit (INDEPENDENT_AMBULATORY_CARE_PROVIDER_SITE_OTHER): Payer: HMO

## 2023-02-13 DIAGNOSIS — I999 Unspecified disorder of circulatory system: Secondary | ICD-10-CM | POA: Diagnosis not present

## 2023-02-13 DIAGNOSIS — H5711 Ocular pain, right eye: Secondary | ICD-10-CM | POA: Diagnosis not present

## 2023-02-14 LAB — VAS US ABI WITH/WO TBI
Left ABI: 1.39
Right ABI: 1.32

## 2023-02-17 NOTE — Assessment & Plan Note (Addendum)
Acute, possibly secondary to electrolyte imbalance.  Will evaluate with complete metabolic panel and magnesium. May also be secondary to fluctuating heart rate.  No recurrent episodes of atrial fibrillation. Of note normal thyroid testing in July.  Blood sugar normal on June basic metabolic panel.  Hemoglobin A1c in prediabetes range.

## 2023-02-17 NOTE — Assessment & Plan Note (Signed)
Chronic, intermittent Episodes of atrial fibrillation seem to be related to steroid use. She has scheduled follow-up with cardiology. Continue Eliquis anticoagulation.

## 2023-02-18 ENCOUNTER — Encounter: Payer: Self-pay | Admitting: Cardiology

## 2023-02-18 ENCOUNTER — Ambulatory Visit: Payer: HMO | Attending: Cardiology | Admitting: Cardiology

## 2023-02-18 VITALS — BP 128/88 | HR 74 | Ht 66.0 in | Wt 271.8 lb

## 2023-02-18 DIAGNOSIS — I10 Essential (primary) hypertension: Secondary | ICD-10-CM | POA: Diagnosis not present

## 2023-02-18 DIAGNOSIS — E7800 Pure hypercholesterolemia, unspecified: Secondary | ICD-10-CM

## 2023-02-18 DIAGNOSIS — I480 Paroxysmal atrial fibrillation: Secondary | ICD-10-CM

## 2023-02-18 NOTE — Patient Instructions (Signed)

## 2023-02-18 NOTE — Progress Notes (Signed)
Cardiology Office Note:    Date:  02/18/2023   ID:  Kimberly Patton, DOB 1955-08-28, MRN [id number]  PCP:  Excell Seltzer, MD  [facility] Cardiologist:  Debbe Odea, MD  [facility] Electrophysiologist:  Lanier Prude, MD   Referring MD: Excell Seltzer, MD   Chief Complaint  Patient presents with   Follow-up    Patient denies new or acute cardiac problems/concerns today.  No EKG obtained today due to patient see in office by Dr. Lalla Brothers on 02/01/23.    History of Present Illness:    Kimberly Patton is a 67 y.o. female with a hx of paroxysmal atrial fibrillation, hypertension, hiatal hernia, GERD, iron deficiency anemia, SVT  presenting for follow-up.    Denies palpitations, dizziness or chest pain.  Follows up at the weight loss center, has been eating healthier, states losing only 1 pound over the past month.  Insurance previously did not approve Wegovy.  Tolerating verapamil, Eliquis, no bleeding effects.  Blood pressure well-controlled.  Prior notes Cardiac monitor 04/2022 no A-fib, occasional paroxysmal SVT Echocardiogram 01/2020 showed normal systolic and diastolic function, EF 60 to 65%.  Lexiscan Myoview 01/2020 with no evidence for ischemia.  History of lone atrial fibrillation in the context of steroid administration. Has a history of hypokalemia while taking HCTZ. Losartan was started after stopping HCTZ. Developed nausea with losartan. This was stopped.   Past Medical History:  Diagnosis Date   ALLERGIC RHINITIS 04/03/2007   Allergy    Not sure   Anemia    ((Pt Qnr Sub: Denies at visit from 08/28/2021 with endocrinology))    Anginal pain (HCC)    Anxiety    Arthritis Not sure   Asthma    hx of years ago - no longer a problem    ASTHMA, PERSISTENT, MODERATE 04/03/2007   Back pain    Cancer (HCC)    skin    Chest pain    Depression screen 01/08/2023   Dyspnea    with exertion   Dyspnea on exertion    a. 11/2007 Echo: EF 60%.   Dysrhythmia     hx of heart arrhythmia 10-15 years ago - followed by DR Ladona Ridgel - not seen in years    Empty sella syndrome (HCC)    GERD (gastroesophageal reflux disease)    Headache    Hemophilia carrier    Hiatal hernia    History of kidney stones    Hypercholesteremia 06/09/2019   Hypertension    Joint pain    HIPS / LEGS   Joint pain    Knee injury    RT   Lymph edema    bilateral legs   Meningioma (HCC)    Morbid obesity (HCC)    Neuromuscular disorder (HCC) Not sure, since my fall   Occipital neuralgia    Osteoarthritis    Palpitations    Paroxysmal SVT (supraventricular tachycardia)    a. 11/2011 48h Holter: RSR, rare PVC's, occas PAC's   Retinal tear of right eye 01/2016   SOB (shortness of breath)    Thyroid nodule 04/15/2018   Ventral hernia     Past Surgical History:  Procedure Laterality Date   ABDOMINAL HYSTERECTOMY  Think 1994   APPENDECTOMY  1996   ARTERY BIOPSY Left 03/21/2022   Procedure: BIOPSY TEMPORAL ARTERY;  Surgeon: Renford Dills, MD;  Location: [facility];  Service: Vascular;  Laterality: Left;   BIOPSY THYROID     08/2021   BREAST CYST  EXCISION Right 1994   Benign   CARDIOVERSION N/A 11/12/2022   Procedure: CARDIOVERSION;  Surgeon: Antonieta Iba, MD;  Location: [facility];  Service: Cardiovascular;  Laterality: N/A;   CESAREAN SECTION     x2   CHOLECYSTECTOMY  1995   COLONOSCOPY WITH PROPOFOL N/A 05/09/2021   Procedure: COLONOSCOPY WITH PROPOFOL;  Surgeon: Wyline Mood, MD;  Location: [facility];  Service: Gastroenterology;  Laterality: N/A;   ESOPHAGOGASTRODUODENOSCOPY (EGD) WITH PROPOFOL N/A 11/02/2022   Procedure: ESOPHAGOGASTRODUODENOSCOPY (EGD) WITH PROPOFOL;  Surgeon: Regis Bill, MD;  Location: [facility] ENDOSCOPY;  Service: Endoscopy;  Laterality: N/A;   EYE SURGERY  01/2016   Repair retinal tear   FOOT SURGERY  1995 / 1996   x2   HERNIA REPAIR     KNEE ARTHROSCOPY W/ MENISCAL REPAIR  07/25/2013   right knee Dr. Dion Saucier   LAPAROSCOPIC  GASTRIC SLEEVE RESECTION N/A 06/08/2013   Procedure: LAPAROSCOPIC GASTRIC SLEEVE RESECTION AND EXCISION OF SEBACEUS CYST FROM MID CHEST takedown of incarcerated ventral hernia and primary repair endoscopy;  Surgeon: Valarie Merino, MD;  Location: [facility];  Service: General;  Laterality: N/A;   LAPAROSCOPIC NISSEN FUNDOPLICATION N/A 02/02/2020   Procedure: LAPAROSCOPIC REPAIR LARGE SYMPTOMATIC HIATAL HERNIA WITH UPPER ENDOSCOPY;  Surgeon: Luretha Murphy, MD;  Location: [facility];  Service: General;  Laterality: N/A;   moles  06/2013   removed 2 moles from under arm and  lowback   NECK SURGERY     occipital nerve damage- injections    OVARIAN CYST REMOVAL  1970   PILONIDAL CYST EXCISION  1975   RADIOACTIVE SEED GUIDED EXCISIONAL BREAST BIOPSY Right 06/23/2021   Procedure: RADIOACTIVE SEED GUIDED EXCISIONAL RIGHT BREAST BIOPSY;  Surgeon: Luretha Murphy, MD;  Location: [facility];  Service: General;  Laterality: Right;   RETINAL TEAR REPAIR CRYOTHERAPY Right 01/2016   [place]   SKIN CANCER EXCISION     TEE WITHOUT CARDIOVERSION N/A 11/12/2022   Procedure: TRANSESOPHAGEAL ECHOCARDIOGRAM;  Surgeon: Antonieta Iba, MD;  Location: [facility];  Service: Cardiovascular;  Laterality: N/A;   THYROID LOBECTOMY Left 08/21/2022   Procedure: LEFT THYROID LOBECTOMY;  Surgeon: Darnell Level, MD;  Location: [facility];  Service: General;  Laterality: Left;   THYROIDECTOMY, PARTIAL     TONSILLECTOMY     TUBAL LIGATION  Think 1994   UPPER GI ENDOSCOPY  06/08/2013   Procedure: UPPER GI ENDOSCOPY;  Surgeon: Valarie Merino, MD;  Location: [facility];  Service: General;;   US ECHOCARDIOGRAPHY  12/2011   WNL - EF 55-60%, mild MR, grade 1 diastolic dysnfiction (mild)   VENTRAL HERNIA REPAIR  2000    Current Medications: Current Meds  Medication Sig   apixaban (ELIQUIS) 5 MG TABS tablet Take 1 tablet (5 mg total) by mouth 2 (two) times daily.   Calcium Citrate-Vitamin D (CITRACAL + D PO) Take 2 tablets by  mouth daily.    Cholecalciferol (VITAMIN D3) 1.25 MG (50000 UT) CAPS TAKE 1 CAPSULE (50,000 UNITS TOTAL) BY MOUTH ONCE A WEEK. TUESDAY   Coenzyme Q10-Vitamin E (QUNOL ULTRA COQ10) 100-150 MG-UNIT CAPS Take 100 mg by mouth daily.   Cyanocobalamin (VITAMIN B-12) 2500 MCG SUBL Place 2,500 mcg under the tongue daily.   diltiazem (CARDIZEM) 30 MG tablet Take 30 mg by mouth 2 (two) times daily as needed.   fluticasone (FLONASE) 50 MCG/ACT nasal spray Place 2 sprays into both nostrils daily.   gabapentin (NEURONTIN) 100 MG capsule Take 100 mg by mouth daily.  Lactobacillus Rhamnosus, GG, (CULTURELLE IMMUNITY SUPPORT PO) Take 1 capsule by mouth daily.   levalbuterol (XOPENEX HFA) 45 MCG/ACT inhaler Inhale 2 puffs into the lungs every 6 (six) hours as needed for wheezing.   Menaquinone-7 (VITAMIN K2 PO) Take 100 mg by mouth daily.   methocarbamol (ROBAXIN) 500 MG tablet Take 500 mg by mouth at bedtime as needed for muscle spasms.   montelukast (SINGULAIR) 10 MG tablet TAKE 1 TABLET BY MOUTH EVERYDAY AT BEDTIME   Multiple Vitamins-Minerals (CENTRUM SILVER 50+WOMEN PO) Take 1 tablet by mouth daily.   nitroGLYCERIN (NITROSTAT) 0.4 MG SL tablet Place 1 tablet (0.4 mg total) under the tongue every 5 (five) minutes as needed for chest pain.   Riboflavin 400 MG CAPS Take 400 mg by mouth daily.   rosuvastatin (CRESTOR) 10 MG tablet Take 1 tablet (10 mg total) by mouth daily.   spironolactone (ALDACTONE) 25 MG tablet Take 1 tablet (25 mg total) by mouth daily.   traMADol (ULTRAM) 50 MG tablet Take 1 tablet (50 mg total) by mouth every 6 (six) hours as needed.   TURMERIC PO Take 2 tablets by mouth daily. 500 mg  With Ginger 50 mg Gummies   verapamil (CALAN-SR) 120 MG CR tablet Take 1 tablet (120 mg total) by mouth 2 (two) times daily.   VOQUEZNA 10 MG TABS Take 1 tablet by mouth every other day. Tapering off   Zoledronic Acid (RECLAST IV) Inject into the vein. Yearly injection     Allergies:   Wasp venom,  Chlorhexidine gluconate, Metronidazole, Prednisone, Other, and Sodium hypochlorite   Social History   Socioeconomic History   Marital status: Divorced    Spouse name: Not on file   Number of children: 2   Years of education: 17   Highest education level: Bachelor's degree (e.g., BA, AB, BS)  Occupational History   Occupation: Magazine features editor: [employer]   Occupation: Retired Runner, broadcasting/film/video  Tobacco Use   Smoking status: Never   Smokeless tobacco: Never   Tobacco comments:    smoke at age 80-12  Vaping Use   Vaping status: Never Used  Substance and Sexual Activity   Alcohol use: Not Currently   Drug use: No   Sexual activity: Not Currently    Partners: Male    Birth control/protection: Abstinence, Pill  Other Topics Concern   Not on file  Social History Narrative   Lives at home alone.   Right-handed.   Occasional caffeine use.   Social Determinants of Health   Financial Resource Strain: Low Risk  (01/26/2023)   Overall Financial Resource Strain (CARDIA)    Difficulty of Paying Living Expenses: Not hard at all  Food Insecurity: No Food Insecurity (01/26/2023)   Hunger Vital Sign    Worried About Running Out of Food in the Last Year: Never true    Ran Out of Food in the Last Year: Never true  Transportation Needs: No Transportation Needs (01/26/2023)   PRAPARE - Administrator, Civil Service (Medical): No    Lack of Transportation (Non-Medical): No  Physical Activity: Insufficiently Active (01/26/2023)   Exercise Vital Sign    Days of Exercise per Week: 1 day    Minutes of Exercise per Session: 30 min  Stress: No Stress Concern Present (01/26/2023)   [employer] of Occupational Health - Occupational Stress Questionnaire    Feeling of Stress : Only a little  Social Connections: Moderately Integrated (01/26/2023)   Social Connection and Isolation  Panel [NHANES]    Frequency of Communication with Friends and Family: More than three times a week     Frequency of Social Gatherings with Friends and Family: Twice a week    Attends Religious Services: More than 4 times per year    Active Member of [employer] or Organizations: Yes    Attends Engineer, structural: More than 4 times per year    Marital Status: Divorced     Family History: The patient's family history includes Breast cancer in her mother; Diabetes in her father, mother, and another family member; Heart disease in her father and mother; Hemophilia in an other family member; Hypertension in her father, mother, and another family member; Kidney failure in her mother; Obesity in her mother; Stroke in an other family member.  ROS:   Please see the history of present illness.     All other systems reviewed and are negative.  EKGs/Labs/Other Studies Reviewed:    The following studies were reviewed today:   EKG:  EKG not  ordered today.  Recent Labs: 11/12/2022: Hemoglobin 12.9; Platelets 340 12/27/2022: TSH 2.07 01/29/2023: ALT 14; BUN 20; Creatinine, Ser 0.58; Magnesium 2.2; Potassium 4.3; Sodium 140  Recent Lipid Panel    Component Value Date/Time   CHOL 142 11/10/2022 0350   CHOL 168 07/31/2021 0905   TRIG 132 11/10/2022 0350   HDL 60 11/10/2022 0350   HDL 67 07/31/2021 0905   CHOLHDL 2.4 11/10/2022 0350   VLDL 26 11/10/2022 0350   LDLCALC 56 11/10/2022 0350   LDLCALC 85 07/31/2021 0905    Physical Exam:    VS:  BP 128/88 (BP Location: Left Arm, Patient Position: Sitting, Cuff Size: Large)   Pulse 74   Ht 5\' 6"  (1.676 m)   Wt 271 lb 12.8 oz (123.3 kg)   SpO2 100%   BMI 43.87 kg/m     Wt Readings from Last 3 Encounters:  02/18/23 271 lb 12.8 oz (123.3 kg)  02/01/23 274 lb (124.3 kg)  01/29/23 275 lb 2 oz (124.8 kg)     GEN:  Well nourished, well developed in no acute distress, obese HEENT: Normal NECK: No JVD; No carotid bruits CARDIAC: RRR, no murmurs, rubs, gallops RESPIRATORY:  Clear to auscultation without rales, wheezing or rhonchi   ABDOMEN: Soft, non-tender, non-distended MUSCULOSKELETAL: Lower extremity edema/ lymphedema noted SKIN: Warm and dry NEUROLOGIC:  Alert and oriented x 3 PSYCHIATRIC:  Normal affect   ASSESSMENT:    1. Paroxysmal A-fib (HCC)   2. Primary hypertension   3. Pure hypercholesterolemia   4. Morbid obesity (HCC)    PLAN:    In order of problems listed above:   Paroxysmal atrial fibrillation, currently in sinus rhythm.  Continue verapamil 120 mg CR twice daily, Eliquis.   hypertension.  BP controlled.  Continue Cardizem SR 120 mg twice daily, Aldactone.. Hyperlipidemia, continue Crestor 10 mg daily.  Morbid obesity, low-calorie diet, weight loss advised.  Reginal Lutes previously considered, patient cost to high.  Follows up at the weight loss center.  Follow-up in 12 months   This note was generated in part or whole with voice recognition software. Voice recognition is usually quite accurate but there are transcription errors that can and very often do occur. I apologize for any typographical errors that were not detected and corrected.  Medication Adjustments/Labs and Tests Ordered: Current medicines are reviewed at length with the patient today.  Concerns regarding medicines are outlined above.  No orders of the defined types  were placed in this encounter.   No orders of the defined types were placed in this encounter.    Patient Instructions  Medication Instructions:   Your physician recommends that you continue on your current medications as directed. Please refer to the Current Medication list given to you today.  *If you need a refill on your cardiac medications before your next appointment, please call your pharmacy*   Lab Work:  None Ordered  If you have labs (blood work) drawn today and your tests are completely normal, you will receive your results only by: MyChart Message (if you have MyChart) OR A paper copy in the mail If you have any lab test that is abnormal or we  need to change your treatment, we will call you to review the results.   Testing/Procedures:  None Ordered   Follow-Up: At [facility], you and your health needs are our priority.  As part of our continuing mission to provide you with exceptional heart care, we have created designated Provider Care Teams.  These Care Teams include your primary Cardiologist (physician) and Advanced Practice Providers (APPs -  Physician Assistants and Nurse Practitioners) who all work together to provide you with the care you need, when you need it.  We recommend signing up for the patient portal called "MyChart".  Sign up information is provided on this After Visit Summary.  MyChart is used to connect with patients for Virtual Visits (Telemedicine).  Patients are able to view lab/test results, encounter notes, upcoming appointments, etc.  Non-urgent messages can be sent to your provider as well.   To learn more about what you can do with MyChart, go to [email or web address].    Your next appointment:   12 month(s)  Provider:   You may see Debbe Odea, MD or one of the following Advanced Practice Providers on your designated Care Team:   Nicolasa Ducking, NP Eula Listen, PA-C Cadence Fransico Michael, PA-C Charlsie Quest, NP   Signed, Debbe Odea, MD  02/18/2023 9:09 AM    [facility]

## 2023-02-19 ENCOUNTER — Encounter (INDEPENDENT_AMBULATORY_CARE_PROVIDER_SITE_OTHER): Payer: Self-pay | Admitting: Internal Medicine

## 2023-02-19 ENCOUNTER — Ambulatory Visit (INDEPENDENT_AMBULATORY_CARE_PROVIDER_SITE_OTHER): Payer: HMO | Admitting: Internal Medicine

## 2023-02-19 VITALS — BP 121/77 | HR 74 | Temp 98.4°F | Ht 66.0 in | Wt 271.0 lb

## 2023-02-19 DIAGNOSIS — Z6841 Body Mass Index (BMI) 40.0 and over, adult: Secondary | ICD-10-CM | POA: Diagnosis not present

## 2023-02-19 DIAGNOSIS — Z9884 Bariatric surgery status: Secondary | ICD-10-CM

## 2023-02-19 DIAGNOSIS — R948 Abnormal results of function studies of other organs and systems: Secondary | ICD-10-CM | POA: Diagnosis not present

## 2023-02-19 DIAGNOSIS — R7303 Prediabetes: Secondary | ICD-10-CM | POA: Diagnosis not present

## 2023-02-19 MED ORDER — METFORMIN HCL ER 500 MG PO TB24
500.0000 mg | ORAL_TABLET | Freq: Two times a day (BID) | ORAL | 0 refills | Status: AC
Start: 2023-02-19 — End: ?

## 2023-02-19 NOTE — Assessment & Plan Note (Signed)
Patient has a slower than predicted metabolism. IC 1627 vs. calculated 2364.  This may contribute to weight gain, chronic fatigue and difficulty losing weight.  This is likely due to decrease muscle mass, metabolic adaptations following gastric bypass, multiple comorbid conditions.  We will screen for OSA at the next office visit  The fact that she has had gradual weight loss at 1500-calorie target suggest that her IC is representative of current metabolic rate.  She would need further calorie reductions to achieve weight loss.    We reviewed measures to improve metabolism including not skipping meals, progressive strengthening exercises, increasing protein intake at every meal and maintaining adequate hydration and sleep.

## 2023-02-19 NOTE — Assessment & Plan Note (Signed)
Presurgical weight 340 pounds, nadir 200 and a year and a half.  Started to regain weight in 2017 after divorce.  Her weight regain is multifactorial and likely secondary to behavioral and metabolic maladaptations following gastric bypass.

## 2023-02-19 NOTE — Assessment & Plan Note (Signed)
Most recent A1c is  Lab Results  Component Value Date   HGBA1C 5.8 (H) 11/09/2022   HGBA1C  12/03/2007    5.5 (NOTE)   The ADA recommends the following therapeutic goal for glycemic   control related to Hgb A1C measurement:   Goal of Therapy:   < 7.0% Hgb A1C   Reference: American Diabetes Association: Clinical Practice   Recommendations 2008, Diabetes Care,  2008, 31:(Suppl 1).    Patient aware of disease state and risk of progression. This may contribute to abnormal cravings, fatigue and diabetic complications without having diabetes.   We have discussed treatment options which include: losing 7 to 10% of body weight, increasing physical activity to a goal of 150 minutes a week at moderate intensity.  Advised to maintain a diet low on simple and processed carbohydrates.  After discussion of benefits and side effects she is agreeable to starting metformin XR 500 mg 1 tablet twice a day.

## 2023-02-19 NOTE — Assessment & Plan Note (Signed)
Kimberly Patton shares her frustration today she would like to lose weight quicker.  I counseled patient on managing expectations also the reason why her weight loss rate is gradual is because were not generating enough of a deficit.  We discussed switching to a 1200-calorie target and 90 g of protein per day.  To add to the deficit she may add a protein shake as a meal replacement.  In addition were starting metformin XR 500 mg for diabetes prevention and incretin effect.  We also discussed the importance of increasing volume of physical activity.

## 2023-02-19 NOTE — Progress Notes (Signed)
Office: [phone]  /  Fax: [phone]  WEIGHT SUMMARY AND BIOMETRICS  Vitals Temp: 98.4 F (36.9 C) BP: 121/77 Pulse Rate: 74 SpO2: 97 %   Anthropometric Measurements Height: 5\' 6"  (1.676 m) Weight: 271 lb (122.9 kg) BMI (Calculated): 43.76 Weight at Last Visit: 273 lb Weight Lost Since Last Visit: 2 lb Weight Gained Since Last Visit: 0 lb Starting Weight: 275 lb Total Weight Loss (lbs): 4 lb (1.814 kg) Peak Weight: 340 lb   Body Composition  Body Fat %: 54.8 % Fat Mass (lbs): 148.6 lbs Muscle Mass (lbs): 116.2 lbs Total Body Water (lbs): 94.2 lbs Visceral Fat Rating : 19    RMR: 1627  Today's Visit #: 3  Starting Date: 01/08/23   HPI  Chief Complaint: OBESITY  Kimberly Patton is here to discuss her progress with her obesity treatment plan. She is on the keeping a food journal and adhering to recommended goals of 1500 calories and 100 protein and states she is following her eating plan approximately 99 % of the time. She states she is exercising 25-30 minutes 3 times per week.  Interval History:  Since last office visit she has lost 2 pounds. Has been tracking and journaling.  She is somewhat frustrated by her slow progress.  She has been losing about half a pound per week this is consistent with calorie target which is overestimating metabolic rate. She reports good adherence to reduced calorie nutritional plan. She has been working on reading food labels, not skipping meals, increasing protein intake at every meal, journaling and tracking calories, and making healthier choices  Orexigenic Control: Denies problems with appetite and hunger signals.  Denies problems with satiety and satiation.  Denies problems with eating patterns and portion control.  Denies abnormal cravings. Denies feeling deprived or restricted.   Barriers identified: low volume of physical acitivity, orthopedic problems, medical conditions or chronic pain affecting mobility, and slow  metabolism for age.   Pharmacotherapy for weight loss: She is currently taking no anti-obesity medication.    ASSESSMENT AND PLAN  TREATMENT PLAN FOR OBESITY:  Recommended Dietary Goals  Kimberly Patton is currently in the action stage of change. As such, her goal is to continue weight management plan. She has agreed to: switch to 1200-calorie plan , replaced lunch with a protein shake and a slight solid.  Patient has been tracking and journaling at 1500 and is losing about half a pound per week.  Patient truly has a slow metabolism so not generating enough of the deficit.  Calories need to be adjusted.  Behavioral Intervention  We discussed the following Behavioral Modification Strategies today: increasing lean protein intake, decreasing simple carbohydrates , increasing vegetables, increasing lower glycemic fruits, increasing fiber rich foods, avoiding skipping meals, increasing water intake, work on meal planning and preparation, work on tracking and journaling calories using tracking application, and planning for success.  Additional resources provided today: None  Recommended Physical Activity Goals  Kimberly Patton has been advised to work up to 150 minutes of moderate intensity aerobic activity a week and strengthening exercises 2-3 times per week for cardiovascular health, weight loss maintenance and preservation of muscle mass.   She has agreed to :  Think about ways to increase daily physical activity and overcoming barriers to exercise  Pharmacotherapy We discussed various medication options to help Kimberly Patton with her weight loss efforts and we both agreed to :  Starting metformin XR 500 mg twice daily for incretin effect and diabetes prevention.  ASSOCIATED CONDITIONS ADDRESSED TODAY  Prediabetes Assessment & Plan: Most recent A1c is  Lab Results  Component Value Date   HGBA1C 5.8 (H) 11/09/2022   HGBA1C  12/03/2007    5.5 (NOTE)   The ADA recommends the following therapeutic goal  for glycemic   control related to Hgb A1C measurement:   Goal of Therapy:   < 7.0% Hgb A1C   Reference: American Diabetes Association: Clinical Practice   Recommendations 2008, Diabetes Care,  2008, 31:(Suppl 1).    Patient aware of disease state and risk of progression. This may contribute to abnormal cravings, fatigue and diabetic complications without having diabetes.   We have discussed treatment options which include: losing 7 to 10% of body weight, increasing physical activity to a goal of 150 minutes a week at moderate intensity.  Advised to maintain a diet low on simple and processed carbohydrates.  After discussion of benefits and side effects she is agreeable to starting metformin XR 500 mg 1 tablet twice a day.   Orders: -     metFORMIN HCl ER; Take 1 tablet (500 mg total) by mouth 2 (two) times daily with a meal.  Dispense: 60 tablet; Refill: 0  Abnormal metabolism Assessment & Plan: Patient has a slower than predicted metabolism. IC 1627 vs. calculated 2364.  This may contribute to weight gain, chronic fatigue and difficulty losing weight.  This is likely due to decrease muscle mass, metabolic adaptations following gastric bypass, multiple comorbid conditions.  We will screen for OSA at the next office visit  The fact that she has had gradual weight loss at 1500-calorie target suggest that her IC is representative of current metabolic rate.  She would need further calorie reductions to achieve weight loss.    We reviewed measures to improve metabolism including not skipping meals, progressive strengthening exercises, increasing protein intake at every meal and maintaining adequate hydration and sleep.     Class 3 severe obesity with serious comorbidity and body mass index (BMI) of 40.0 to 44.9 in adult, unspecified obesity type [facility]) Assessment & Plan: Kimberly Patton shares her frustration today she would like to lose weight quicker.  I counseled patient on managing expectations  also the reason why her weight loss rate is gradual is because were not generating enough of a deficit.  We discussed switching to a 1200-calorie target and 90 g of protein per day.  To add to the deficit she may add a protein shake as a meal replacement.  In addition were starting metformin XR 500 mg for diabetes prevention and incretin effect.  We also discussed the importance of increasing volume of physical activity.   Status post laparoscopic sleeve gastrectomy Dec 2014 Assessment & Plan: Presurgical weight 340 pounds, nadir 200 and a year and a half.  Started to regain weight in 2017 after divorce.  Her weight regain is multifactorial and likely secondary to behavioral and metabolic maladaptations following gastric bypass.     PHYSICAL EXAM:  Blood pressure 121/77, pulse 74, temperature 98.4 F (36.9 C), height 5\' 6"  (1.676 m), weight 271 lb (122.9 kg), SpO2 97%. Body mass index is 43.74 kg/m.  General: She is overweight, cooperative, alert, well developed, and in no acute distress. PSYCH: Has normal mood, affect and thought process.   HEENT: EOMI, sclerae are anicteric. Lungs: Normal breathing effort, no conversational dyspnea. Extremities: No edema.  Neurologic: No gross sensory or motor deficits. No tremors or fasciculations noted.    DIAGNOSTIC DATA REVIEWED:  BMET    Component Value Date/Time  NA 140 01/29/2023 1016   NA 139 01/21/2020 1137   K 4.3 01/29/2023 1016   CL 104 01/29/2023 1016   CO2 28 01/29/2023 1016   GLUCOSE 92 01/29/2023 1016   BUN 20 01/29/2023 1016   BUN 16 01/21/2020 1137   CREATININE 0.58 01/29/2023 1016   CREATININE 0.57 01/01/2020 1623   CALCIUM 9.6 01/29/2023 1016   GFRNONAA >60 11/12/2022 0322   GFRAA >60 02/02/2020 1417   Lab Results  Component Value Date   HGBA1C 5.8 (H) 11/09/2022   HGBA1C  12/03/2007    5.5 (NOTE)   The ADA recommends the following therapeutic goal for glycemic   control related to Hgb A1C measurement:   Goal of  Therapy:   < 7.0% Hgb A1C   Reference: American Diabetes Association: Clinical Practice   Recommendations 2008, Diabetes Care,  2008, 31:(Suppl 1).   Lab Results  Component Value Date   INSULIN 11.9 01/08/2023   Lab Results  Component Value Date   TSH 2.07 12/27/2022   CBC    Component Value Date/Time   WBC 9.6 11/12/2022 0322   RBC 4.91 11/12/2022 0322   HGB 12.9 11/12/2022 0322   HCT 39.9 11/12/2022 0322   PLT 340 11/12/2022 0322   MCV 81.3 11/12/2022 0322   MCH 26.3 11/12/2022 0322   MCHC 32.3 11/12/2022 0322   RDW 15.3 11/12/2022 0322   Iron Studies    Component Value Date/Time   IRON 51 08/23/2020 0801   TIBC 496 (H) 01/10/2020 1404   FERRITIN 5.3 (L) 08/23/2020 0801   IRONPCTSAT 10.3 (L) 08/23/2020 0801   IRONPCTSAT 5 (L) 05/28/2017 1647   Lipid Panel     Component Value Date/Time   CHOL 142 11/10/2022 0350   CHOL 168 07/31/2021 0905   TRIG 132 11/10/2022 0350   HDL 60 11/10/2022 0350   HDL 67 07/31/2021 0905   CHOLHDL 2.4 11/10/2022 0350   VLDL 26 11/10/2022 0350   LDLCALC 56 11/10/2022 0350   LDLCALC 85 07/31/2021 0905   Hepatic Function Panel     Component Value Date/Time   PROT 7.5 01/29/2023 1016   ALBUMIN 4.3 01/29/2023 1016   AST 18 01/29/2023 1016   ALT 14 01/29/2023 1016   ALKPHOS 70 01/29/2023 1016   BILITOT 0.4 01/29/2023 1016   BILIDIR 0.1 12/11/2019 0852   IBILI 0.6 12/11/2019 0852      Component Value Date/Time   TSH 2.07 12/27/2022 0729   Nutritional Lab Results  Component Value Date   VD25OH 54.40 03/14/2022   VD25OH 33.36 09/30/2017   VD25OH 32.96 06/12/2017     Return for 3 weeks with Shawn and then with me.. She was informed of the importance of frequent follow up visits to maximize her success with intensive lifestyle modifications for her multiple health conditions.   ATTESTASTION STATEMENTS:  Reviewed by clinician on day of visit: allergies, medications, problem list, medical history, surgical history, family  history, social history, and previous encounter notes.     Worthy Rancher, MD

## 2023-02-21 DIAGNOSIS — Z9884 Bariatric surgery status: Secondary | ICD-10-CM | POA: Diagnosis not present

## 2023-02-21 DIAGNOSIS — Z8719 Personal history of other diseases of the digestive system: Secondary | ICD-10-CM | POA: Diagnosis not present

## 2023-02-21 DIAGNOSIS — K2100 Gastro-esophageal reflux disease with esophagitis, without bleeding: Secondary | ICD-10-CM | POA: Diagnosis not present

## 2023-02-21 DIAGNOSIS — K449 Diaphragmatic hernia without obstruction or gangrene: Secondary | ICD-10-CM | POA: Diagnosis not present

## 2023-02-21 DIAGNOSIS — Z9889 Other specified postprocedural states: Secondary | ICD-10-CM | POA: Diagnosis not present

## 2023-02-21 DIAGNOSIS — Z6841 Body Mass Index (BMI) 40.0 and over, adult: Secondary | ICD-10-CM | POA: Diagnosis not present

## 2023-02-25 ENCOUNTER — Encounter: Payer: Self-pay | Admitting: Cardiology

## 2023-03-01 ENCOUNTER — Ambulatory Visit: Payer: Self-pay

## 2023-03-01 ENCOUNTER — Other Ambulatory Visit: Payer: Self-pay | Admitting: Family Medicine

## 2023-03-01 DIAGNOSIS — Z1231 Encounter for screening mammogram for malignant neoplasm of breast: Secondary | ICD-10-CM

## 2023-03-01 NOTE — Telephone Encounter (Signed)
Chief Complaint: Medication Question  Symptoms: None Frequency: Double dose today Pertinent Negatives: Patient denies any symptoms Disposition: [] ED /[] Urgent Care (no appt availability in office) / [] Appointment(In office/virtual)/ []  [facility]/ [x] Home Care/ [] Refused Recommended Disposition /[]  Mobile Bus/ []  Follow-up with PCP Additional Notes: Patient stated she accidentally took a double dose of her Metformin 500 mg 24 hour tablet this morning. Patient stated she took the scheduled dose this morning and about 2 and half hours later she took a second dose. Patient denies any symptoms and wanted advice on what to do. Patient stated she called the provider that prescribed the medication and was transferred to the NT line. Patient also reported that she is not diabetic and is taking the medication for weight loss. Care advice given and patient was advised to monitor for the following symptoms, nausea, vomiting, diarrhea, abdominal pain, sweating. Let prescribing provider know if any symptoms occur. Patient verbalized understanding.   Summary: rx concern   The patient shares that they accidentally took two metFORMIN (GLUCOPHAGE-XR) 500 MG 24 hr tablet [[id number]] this morning at 6 AM and accidentally took another at 8:25 AM this morning 03/01/23  The patient is experiencing no symptoms at the time of call  Please contact when possible     Reason for Disposition  [1] DOUBLE DOSE (an extra dose or lesser amount) of prescription drug AND [2] NO symptoms  (Exception: A double dose of antibiotics.)  Answer Assessment - Initial Assessment Questions 1. NAME of MEDICINE: "What medicine(s) are you calling about?"     Metformin 500mg  24hr 2. QUESTION: "What is your question?" (e.g., double dose of medicine, side effect)     I took a double dose of my medicine 3. PRESCRIBER: "Who prescribed the medicine?" Reason: if prescribed by specialist, call should be referred to that  group.     [facility] 4. SYMPTOMS: "Do you have any symptoms?" If Yes, ask: "What symptoms are you having?"  "How bad are the symptoms (e.g., mild, moderate, severe)     No  Protocols used: Medication Question Call-A-AH

## 2023-03-05 ENCOUNTER — Ambulatory Visit: Payer: HMO | Admitting: Pulmonary Disease

## 2023-03-05 ENCOUNTER — Encounter: Payer: Self-pay | Admitting: Pulmonary Disease

## 2023-03-05 VITALS — BP 126/84 | HR 67 | Temp 97.6°F | Ht 66.0 in | Wt 266.4 lb

## 2023-03-05 DIAGNOSIS — K219 Gastro-esophageal reflux disease without esophagitis: Secondary | ICD-10-CM

## 2023-03-05 DIAGNOSIS — J4520 Mild intermittent asthma, uncomplicated: Secondary | ICD-10-CM

## 2023-03-05 NOTE — Progress Notes (Signed)
Subjective:    Patient ID: Kimberly Patton, female    DOB: [birth year], 67 y.o.   MRN: [id number]  Patient Care Team: Excell Seltzer, MD as PCP - General (Family Medicine) Debbe Odea, MD as PCP - Cardiology (Cardiology) Lanier Prude, MD as PCP - Electrophysiology (Cardiology) Heniford, Rodolph Bong, MD as Referring Physician (Surgery) Salena Saner, MD as Consulting Physician (Pulmonary Disease) Darnell Level, MD as Consulting Physician (General Surgery)  Chief Complaint  Patient presents with   Follow-up    Breathing is good. No SOB or wheezing. Dry cough.    HPI Patient is a 67 year old lifelong never smoker who presents for follow-up on the issue of asthma.  I last saw the patient on 15 August 2022 and in the interim she has seen Ames Dura, NP on 10 December 2022.  Since her last visit she has engaged in weight loss and has lost over 20 pounds.  She notices that her reflux symptoms are markedly better and so is her cough.  She also notices that her asthma is much better controlled.  She is only on montelukast and as needed levo albuterol at present.  She did not tolerate Symbicort nor Wixela due to issues with flares of her A-fib on those medications.  Prior issues with shortness of breath have resolved with weight loss.  The patient also in the past was worked up for a lung nodule in the right lower lobe which was noted to be inflammatory and resolved on subsequent CT. Since her most recent visit here she has not had any fevers, chills or sweats.  No cough or sputum production.  No hemoptysis.  No orthopnea or paroxysmal nocturnal dyspnea.  Overall she feels well and looks well.  Last FeNO 10 December 2022 was 24.  During her last visit she was given a trial of Spiriva Respimat however, she is not using this medication as she did not feel she needed it.    Review of Systems A 10 point review of systems was performed and it is as noted above otherwise negative.   Patient Active  Problem List   Diagnosis Date Noted   Dizziness 01/29/2023   At increased risk for cardiovascular disease 01/22/2023   Prediabetes 01/08/2023   Depression screen 01/08/2023   SOB (shortness of breath) on exertion 01/08/2023   Other fatigue 01/08/2023   Abnormal metabolism 01/08/2023   Atrial fibrillation (HCC) 11/10/2022   Paroxysmal atrial fibrillation with RVR (HCC) 11/09/2022   HTN (hypertension) 11/09/2022   Asthma 11/09/2022   Chest pain with moderate risk for cardiac etiology 11/09/2022   History of lobectomy of thyroid 09/20/2022   Left thyroid nodule 08/21/2022   Acute cystitis 08/17/2022   COVID-19 virus infection 04/30/2022   Paroxysmal atrial fibrillation (HCC) 03/28/2022   Empty sella syndrome (HCC) 03/28/2022   Varicose veins with inflammation 01/07/2022   S/P breast lumpectomy 10/10/2021   Cervical radiculopathy 06/28/2020   Chronic venous insufficiency 05/29/2020   Lymphedema 05/29/2020   CAD (coronary artery disease) 05/29/2020   Cervical lymphadenitis 04/18/2020   History of repair of hiatal hernia 02/02/2020   Orthostatic hypotension 01/01/2020   Hypercholesteremia 06/09/2019   Chronic neuropathic pain 07/31/2018   Thyroid nodule 04/15/2018   Skin lesion of scalp 09/06/2017   Age-related osteoporosis without current pathological fracture 12/04/2016   Osteoarthritis of knee 07/17/2016   Meningioma (HCC) 01/18/2016   Family history of aortic stenosis 01/20/2015   Status post laparoscopic sleeve gastrectomy Dec 2014 06/08/2013  Class 3 severe obesity with serious comorbidity and body mass index (BMI) of 40.0 to 44.9 in adult [facility]) 12/08/2012   Primary hypertension 10/11/2011   Varicose veins of bilateral lower extremities with other complications 06/18/2011   Palpitation 09/12/2010   FIBROIDS, UTERUS 04/03/2007   Asthma, mild intermittent 04/03/2007    Social History   Tobacco Use   Smoking status: Never   Smokeless tobacco: Never   Tobacco comments:     smoke at age [age]  Substance Use Topics   Alcohol use: Not Currently    Allergies  Allergen Reactions   Wasp Venom Anaphylaxis   Chlorhexidine Gluconate     Itching under breasts   Metronidazole Other (See Comments)    Chest tightness, neck tightness   Prednisone Other (See Comments)    Severe migraine / with taper dose Told by Cardiologist to avoid due to Afib   Other Rash    Bleach   Sodium Hypochlorite Rash    Liquid clorox bleach    Current Meds  Medication Sig   apixaban (ELIQUIS) 5 MG TABS tablet Take 1 tablet (5 mg total) by mouth 2 (two) times daily.   Calcium Citrate-Vitamin D (CITRACAL + D PO) Take 2 tablets by mouth daily.    Cholecalciferol (VITAMIN D3) 1.25 MG (50000 UT) CAPS TAKE 1 CAPSULE (50,000 UNITS TOTAL) BY MOUTH ONCE A WEEK. TUESDAY   Coenzyme Q10-Vitamin E (QUNOL ULTRA COQ10) 100-150 MG-UNIT CAPS Take 100 mg by mouth daily.   Cyanocobalamin (VITAMIN B-12) 2500 MCG SUBL Place 2,500 mcg under the tongue daily.   diltiazem (CARDIZEM) 30 MG tablet Take 30 mg by mouth 2 (two) times daily as needed.   esomeprazole (NEXIUM) 40 MG capsule Take 40 mg by mouth daily.   gabapentin (NEURONTIN) 100 MG capsule Take 100 mg by mouth daily.   Lactobacillus Rhamnosus, GG, (CULTURELLE IMMUNITY SUPPORT PO) Take 1 capsule by mouth daily.   levalbuterol (XOPENEX HFA) 45 MCG/ACT inhaler Inhale 2 puffs into the lungs every 6 (six) hours as needed for wheezing.   Menaquinone-7 (VITAMIN K2 PO) Take 100 mg by mouth daily.   metFORMIN (GLUCOPHAGE-XR) 500 MG 24 hr tablet Take 1 tablet (500 mg total) by mouth 2 (two) times daily with a meal.   methocarbamol (ROBAXIN) 500 MG tablet Take 500 mg by mouth at bedtime as needed for muscle spasms.   montelukast (SINGULAIR) 10 MG tablet TAKE 1 TABLET BY MOUTH EVERYDAY AT BEDTIME   Multiple Vitamins-Minerals (CENTRUM SILVER 50+WOMEN PO) Take 1 tablet by mouth daily.   nitroGLYCERIN (NITROSTAT) 0.4 MG SL tablet Place 1 tablet (0.4 mg total)  under the tongue every 5 (five) minutes as needed for chest pain.   Riboflavin 400 MG CAPS Take 400 mg by mouth daily.   rosuvastatin (CRESTOR) 10 MG tablet Take 1 tablet (10 mg total) by mouth daily.   spironolactone (ALDACTONE) 25 MG tablet Take 1 tablet (25 mg total) by mouth daily.   traMADol (ULTRAM) 50 MG tablet Take 1 tablet (50 mg total) by mouth every 6 (six) hours as needed.   TURMERIC PO Take 2 tablets by mouth daily. 500 mg  With Ginger 50 mg Gummies   verapamil (CALAN-SR) 120 MG CR tablet Take 1 tablet (120 mg total) by mouth 2 (two) times daily.   Zoledronic Acid (RECLAST IV) Inject into the vein. Yearly injection    Immunization History  Administered Date(s) Administered   Fluad Quad(high Dose 65+) 03/09/2021, 05/12/2022   Influenza Inj Mdck Quad Pf  02/13/2018   Influenza Whole 06/11/2005, 04/08/2009   Influenza,inj,Quad PF,6+ Mos 03/22/2017, 02/13/2018, 03/03/2019, 04/08/2020   Influenza-Unspecified 03/27/2016   PFIZER(Purple Top)SARS-COV-2 Vaccination 08/07/2019, 08/29/2019   PNEUMOCOCCAL CONJUGATE-20 03/09/2021   Pneumococcal Polysaccharide-23 03/22/2017   Td 06/11/2004   Tdap 01/20/2015        Objective:     BP 126/84 (BP Location: Right Arm, Cuff Size: Large)   Pulse 67   Temp 97.6 F (36.4 C)   Ht 5\' 6"  (1.676 m)   Wt 266 lb 6.4 oz (120.8 kg)   SpO2 99%   BMI 43.00 kg/m   SpO2: 99 % O2 Device: None (Room air)  GENERAL: Obese woman, no acute distress, ambulatory with assistance of a cane.  No conversational dyspnea. HEAD: Normocephalic, atraumatic.  EYES: Pupils equal, round, reactive to light.  No scleral icterus.  MOUTH: Natural dentition, oral mucosa moist, no thrush. NECK: Supple.  Thyromegaly present particularly left lobe. Trachea midline. No JVD.  No adenopathy. PULMONARY: Good air entry bilaterally.  No adventitious sounds. CARDIOVASCULAR: S1 and S2. Regular rate and rhythm.  No rubs, murmurs or gallops heard. ABDOMEN: Obese otherwise  benign. MUSCULOSKELETAL: No joint deformity, no clubbing, no edema. NEUROLOGIC: No focal deficit, no gait disturbance, speech is fluent. SKIN: Intact,warm,dry.  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  Chronic stasis changes. PSYCH: Mood and behavior normal.         Assessment & Plan:     ICD-10-CM   1. Mild intermittent asthma without complication  J45.20    Continue as needed levo albuterol Continue montelukast Well-controlled at present    2. Chronic GERD  K21.9    Better controlled after weight loss Continue antireflux measures Continue esomeprazole    3. Morbid obesity (HCC)  E66.01    Engaged in weight loss Has lost over 20 pounds Continue follow-up with wellness center     Patient appears well-controlled.  She has noted marked improvement of her symptoms of asthma and GERD with weight loss.  She is following up the wellness center.     Gailen Shelter, MD Advanced Bronchoscopy PCCM [facility]    *This note was dictated using voice recognition software/Dragon.  Despite best efforts to proofread, errors can occur which can change the meaning. Any transcriptional errors that result from this process are unintentional and may not be fully corrected at the time of dictation.

## 2023-03-05 NOTE — Patient Instructions (Signed)
Continue your current regimen.  You are doing well.  Your lungs sounded very clear today.  Will see you in follow-up in 6 months time call sooner should any new problems arise.

## 2023-03-14 ENCOUNTER — Encounter (INDEPENDENT_AMBULATORY_CARE_PROVIDER_SITE_OTHER): Payer: Self-pay | Admitting: Family Medicine

## 2023-03-14 ENCOUNTER — Ambulatory Visit (INDEPENDENT_AMBULATORY_CARE_PROVIDER_SITE_OTHER): Payer: HMO | Admitting: Family Medicine

## 2023-03-14 VITALS — BP 122/74 | HR 76 | Temp 98.0°F | Ht 66.0 in | Wt 262.0 lb

## 2023-03-14 DIAGNOSIS — Z6841 Body Mass Index (BMI) 40.0 and over, adult: Secondary | ICD-10-CM

## 2023-03-14 DIAGNOSIS — E669 Obesity, unspecified: Secondary | ICD-10-CM

## 2023-03-14 DIAGNOSIS — I4891 Unspecified atrial fibrillation: Secondary | ICD-10-CM

## 2023-03-14 DIAGNOSIS — R7303 Prediabetes: Secondary | ICD-10-CM

## 2023-03-14 MED ORDER — METFORMIN HCL ER 500 MG PO TB24
500.0000 mg | ORAL_TABLET | Freq: Two times a day (BID) | ORAL | 0 refills | Status: DC
Start: 2023-03-14 — End: 2023-04-04

## 2023-03-14 NOTE — Progress Notes (Signed)
Chief Complaint:   OBESITY Kimberly Patton is here to discuss her progress with her obesity treatment plan along with follow-up of her obesity related diagnoses. Kimberly Patton is on keeping a food journal and adhering to recommended goals of 1200 calories and 90+ grams of protein and states she is following her eating plan approximately 95% of the time. Kimberly Patton states she is on the elliptical for 25-30 minutes 3 times per week.  Today's visit was #: 4 Starting weight: 275 lbs Starting date: 01/08/2023 Today's weight: 262 lbs Today's date: 03/14/2023 Total lbs lost to date: 13 Total lbs lost since last in-office visit: 9  Interim History: Patient normally sees Dr. Rikki Spearing. She started going to the gym when she started the program and hurt her knee again.  She feels the weight loss is very slow.  She is sticking with 1200 calories daily and aiming for 90g of protein a day. She hit the 1200 calories daily and only went over 3 days.  She is getting in the 90g of protein with the assistance of a protein shake.  No upcoming activities or events in the next few weeks except a bus tour to see the autumn leaves on [employer].   Subjective:   1. Prediabetes Patient is on Glucophage daily.  She denies nausea or GI symptoms.  Her last A1c was 6.0, and elevated glucose previously.  2. Atrial fibrillation, unspecified type [facility]) Patient is on Cardizem and verapamil.  She only takes diltiazem as needed.  Assessment/Plan:   1. Prediabetes We will repeat labs in November or December.  We will refill metformin 500 mg twice daily for 1 month.  - metFORMIN (GLUCOPHAGE-XR) 500 MG 24 hr tablet; Take 1 tablet (500 mg total) by mouth 2 (two) times daily with a meal.  Dispense: 60 tablet; Refill: 0  2. Atrial fibrillation, unspecified type [facility]) We will follow-up on patient's symptoms at her next appointment.  3. BMI 40.0-44.9, adult (HCC)  4. Obesity with starting BMI of 44.3 Kimberly Patton is currently in the  action stage of change. As such, her goal is to continue with weight loss efforts. She has agreed to keeping a food journal and adhering to recommended goals of 1200 calories and 90+ grams of protein daily.   Exercise goals: As is.   Behavioral modification strategies: increasing lean protein intake, meal planning and cooking strategies, keeping healthy foods in the home, and planning for success.  Kimberly Patton has agreed to follow-up with our clinic in 3 to 4 weeks with Dr. Rikki Spearing. She was informed of the importance of frequent follow-up visits to maximize her success with intensive lifestyle modifications for her multiple health conditions.   Objective:   Blood pressure 122/74, pulse 76, temperature 98 F (36.7 C), height 5\' 6"  (1.676 m), weight 262 lb (118.8 kg), SpO2 98%. Body mass index is 42.29 kg/m.  General: Cooperative, alert, well developed, in no acute distress. HEENT: Conjunctivae and lids unremarkable. Cardiovascular: Regular rhythm.  Lungs: Normal work of breathing. Neurologic: No focal deficits.   Lab Results  Component Value Date   CREATININE 0.58 01/29/2023   BUN 20 01/29/2023   NA 140 01/29/2023   K 4.3 01/29/2023   CL 104 01/29/2023   CO2 28 01/29/2023   Lab Results  Component Value Date   ALT 14 01/29/2023   AST 18 01/29/2023   ALKPHOS 70 01/29/2023   BILITOT 0.4 01/29/2023   Lab Results  Component Value Date   HGBA1C 5.8 (H) 11/09/2022  HGBA1C 6.0 05/11/2022   HGBA1C 5.6 02/22/2022   HGBA1C 5.1 08/23/2020   HGBA1C 5.3 07/16/2019   Lab Results  Component Value Date   INSULIN 11.9 01/08/2023   Lab Results  Component Value Date   TSH 2.07 12/27/2022   Lab Results  Component Value Date   CHOL 142 11/10/2022   HDL 60 11/10/2022   LDLCALC 56 11/10/2022   TRIG 132 11/10/2022   CHOLHDL 2.4 11/10/2022   Lab Results  Component Value Date   VD25OH 54.40 03/14/2022   VD25OH 33.36 09/30/2017   VD25OH 32.96 06/12/2017   Lab Results  Component  Value Date   WBC 9.6 11/12/2022   HGB 12.9 11/12/2022   HCT 39.9 11/12/2022   MCV 81.3 11/12/2022   PLT 340 11/12/2022   Lab Results  Component Value Date   IRON 51 08/23/2020   TIBC 496 (H) 01/10/2020   FERRITIN 5.3 (L) 08/23/2020   Attestation Statements:   Reviewed by clinician on day of visit: allergies, medications, problem list, medical history, surgical history, family history, social history, and previous encounter notes.   I, Burt Knack, am acting as transcriptionist for Reuben Likes, MD.  I have reviewed the above documentation for accuracy and completeness, and I agree with the above. - Reuben Likes, MD

## 2023-03-29 ENCOUNTER — Ambulatory Visit (INDEPENDENT_AMBULATORY_CARE_PROVIDER_SITE_OTHER): Payer: HMO

## 2023-03-29 DIAGNOSIS — Z23 Encounter for immunization: Secondary | ICD-10-CM

## 2023-03-31 ENCOUNTER — Other Ambulatory Visit: Payer: Self-pay | Admitting: Family Medicine

## 2023-04-01 NOTE — Telephone Encounter (Signed)
Lvm for patient tcb and schedule 

## 2023-04-01 NOTE — Telephone Encounter (Signed)
Last office visit 01/29/2023 for dizziness and slow metabolism.  Last refilled 01/03/2023 for #12 with no refills.  Vit D level 03/14/2022 which was normal at 54.40 ng/mL.  Next Appt: No future appointments with PCP.       Please schedule CPE with fasting labs prior for Dr. Ermalene Searing after 05/19/23.

## 2023-04-01 NOTE — Telephone Encounter (Signed)
Patient has been scheduled

## 2023-04-04 ENCOUNTER — Ambulatory Visit (INDEPENDENT_AMBULATORY_CARE_PROVIDER_SITE_OTHER): Payer: HMO | Admitting: Internal Medicine

## 2023-04-04 ENCOUNTER — Encounter (INDEPENDENT_AMBULATORY_CARE_PROVIDER_SITE_OTHER): Payer: Self-pay | Admitting: Internal Medicine

## 2023-04-04 VITALS — BP 116/77 | HR 59 | Temp 97.6°F | Ht 66.0 in | Wt 259.0 lb

## 2023-04-04 DIAGNOSIS — R7303 Prediabetes: Secondary | ICD-10-CM | POA: Diagnosis not present

## 2023-04-04 DIAGNOSIS — Z9884 Bariatric surgery status: Secondary | ICD-10-CM

## 2023-04-04 DIAGNOSIS — Z6841 Body Mass Index (BMI) 40.0 and over, adult: Secondary | ICD-10-CM | POA: Diagnosis not present

## 2023-04-04 DIAGNOSIS — E66813 Obesity, class 3: Secondary | ICD-10-CM | POA: Diagnosis not present

## 2023-04-04 MED ORDER — METFORMIN HCL ER 500 MG PO TB24
500.0000 mg | ORAL_TABLET | Freq: Two times a day (BID) | ORAL | 0 refills | Status: DC
Start: 2023-04-04 — End: 2023-05-02

## 2023-04-04 NOTE — Assessment & Plan Note (Signed)
Patient has lost 33 pounds since the beginning of this year.  She has a history of gastric bypass and this was met with some metabolic adaptations.  Overall she is also noticing some nonskilled accomplishments with changes in body composition as well as close eyes.  I congratulated her on her efforts and reassured her that she is losing weight at a healthy rate although at times she feels a little bit disappointed that is not faster.  She may be interested in considering antiobesity medications.  For now she will continue on metformin for diabetes prophylaxis and incretin effect.

## 2023-04-04 NOTE — Assessment & Plan Note (Signed)
Presurgical weight 340 pounds, nadir 200 and a year and a half.  Started to regain weight in 2017 after divorce.  Her weight regain is multifactorial and likely secondary to behavioral and metabolic maladaptations following gastric bypass.

## 2023-04-04 NOTE — Progress Notes (Signed)
Office: [phone]  /  Fax: [phone]  WEIGHT SUMMARY AND BIOMETRICS  Vitals Temp: 97.6 F (36.4 C) BP: 116/77 Pulse Rate: (!) 59 SpO2: 99 %   Anthropometric Measurements Height: 5\' 6"  (1.676 m) Weight: 259 lb (117.5 kg) BMI (Calculated): 41.82 Weight at Last Visit: 262 lb Weight Lost Since Last Visit: 3 lb Weight Gained Since Last Visit: 0 lb Starting Weight: 275 lb Total Weight Loss (lbs): 16 lb (7.258 kg) Peak Weight: 340 lb   Body Composition  Body Fat %: 52.9 % Fat Mass (lbs): 137.2 lbs Muscle Mass (lbs): 115.8 lbs Total Body Water (lbs): 87.2 lbs Visceral Fat Rating : 18    RMR: 1627  Today's Visit #: 5  Starting Date: 01/08/23   HPI  Chief Complaint: OBESITY  Kimberly Patton is here to discuss her progress with her obesity treatment plan. She is on the keeping a food journal and adhering to recommended goals of 1200 calories and 90 protein and states she is following her eating plan approximately 85 % of the time. She states she is exercising 25-30 minutes 3 times per week.  Interval History:   Discussed the use of AI scribe software for clinical note transcription with the patient, who gave verbal consent to proceed.  History of Present Illness    The patient, with a history of gastric sleeve surgery, presents for weight management. She reports a slow but steady weight loss of approximately 1 pound per week, totaling a loss of 33 pounds since the beginning of the year. Despite this progress, the patient expresses frustration with the pace of weight loss and struggles with hunger, particularly in the evenings.  The patient has been adhering to a high-protein, low-calorie diet, aiming for 90 grams of protein within a 1200 calorie limit. However, she finds it challenging to meet the protein goal without exceeding the calorie limit. She has been exploring low-calorie, high-protein food options and using a food tracking app to monitor her intake.  The  patient also reports some physical limitations due to knee problems and nerve damage on one side of her body, which restrict her ability to engage in regular physical activity. Despite these limitations, she has been walking when possible and using a seated elliptical machine for exercise. She also participates in a movement exercise program called [employer], which she believes is helping with her lymphedema.  The patient is on metformin, which she reports makes her feel full but occasionally upsets her stomach. She has noticed that certain foods seem to exacerbate this effect. She also expresses concern about the upcoming holiday season, as she traditionally bakes [place] cookies, which could pose a challenge to her diet.  In summary, the patient is making steady progress in her weight loss journey, despite some challenges with hunger, dietary restrictions, and physical limitations. She is actively seeking strategies to manage these challenges and remains committed to her weight loss goals.        Barriers identified: orthopedic problems, medical conditions or chronic pain affecting mobility and history of gastric bypass .   Pharmacotherapy for weight loss: She is currently taking Metformin (off label use for incretin effect and / or insulin resistance and / or diabetes prevention) with adequate clinical response  and without side effects..    ASSESSMENT AND PLAN  TREATMENT PLAN FOR OBESITY:  Recommended Dietary Goals  [name] is currently in the action stage of change. As such, her goal is to continue weight management plan. She has agreed to: continue current  plan  Behavioral Intervention  We discussed the following Behavioral Modification Strategies today: continue to work on maintaining a reduced calorie state, getting the recommended amount of protein, incorporating whole foods, making healthy choices, staying well hydrated and practicing mindfulness when eating..  Additional resources  provided today: None  Recommended Physical Activity Goals  [name] has been advised to work up to 150 minutes of moderate intensity aerobic activity a week and strengthening exercises 2-3 times per week for cardiovascular health, weight loss maintenance and preservation of muscle mass.   She has agreed to :  Think about enjoyable ways to increase daily physical activity and overcoming barriers to exercise and Increase physical activity in their day and reduce sedentary time (increase NEAT).  Pharmacotherapy We discussed various medication options to help Armetha with her weight loss efforts and we both agreed to : continue current anti-obesity medication regimen  ASSOCIATED CONDITIONS ADDRESSED TODAY  Class 3 severe obesity with serious comorbidity and body mass index (BMI) of 40.0 to 44.9 in adult, unspecified obesity type [facility]) Assessment & Plan: Patient has lost 33 pounds since the beginning of this year.  She has a history of gastric bypass and this was met with some metabolic adaptations.  Overall she is also noticing some nonskilled accomplishments with changes in body composition as well as close eyes.  I congratulated her on her efforts and reassured her that she is losing weight at a healthy rate although at times she feels a little bit disappointed that is not faster.  She may be interested in considering antiobesity medications.  For now she will continue on metformin for diabetes prophylaxis and incretin effect.   Prediabetes Assessment & Plan: Patient counseled on carb insulin model of obesity today.  She will continue on metformin XR 500 mg twice daily for pharmacoprophylaxis.  No adverse effects reported.  She tells me she will be having blood work as part of her annual physical so we will follow-up on those test results.  Continue to maintain a diet low in simple and added sugars.   Status post laparoscopic sleeve gastrectomy Dec 2014 Assessment & Plan: Presurgical weight  340 pounds, nadir 200 and a year and a half.  Started to regain weight in 2017 after divorce.  Her weight regain is multifactorial and likely secondary to behavioral and metabolic maladaptations following gastric bypass.     PHYSICAL EXAM:  Blood pressure 116/77, pulse (!) 59, temperature 97.6 F (36.4 C), height 5\' 6"  (1.676 m), weight 259 lb (117.5 kg), SpO2 99%. Body mass index is 41.8 kg/m.  General: She is overweight, cooperative, alert, well developed, and in no acute distress. PSYCH: Has normal mood, affect and thought process.   HEENT: EOMI, sclerae are anicteric. Lungs: Normal breathing effort, no conversational dyspnea. Extremities: No edema.  Neurologic: No gross sensory or motor deficits. No tremors or fasciculations noted.    DIAGNOSTIC DATA REVIEWED:  BMET    Component Value Date/Time   NA 140 01/29/2023 1016   NA 139 01/21/2020 1137   K 4.3 01/29/2023 1016   CL 104 01/29/2023 1016   CO2 28 01/29/2023 1016   GLUCOSE 92 01/29/2023 1016   BUN 20 01/29/2023 1016   BUN 16 01/21/2020 1137   CREATININE 0.58 01/29/2023 1016   CREATININE 0.57 01/01/2020 1623   CALCIUM 9.6 01/29/2023 1016   GFRNONAA >60 11/12/2022 0322   GFRAA >60 02/02/2020 1417   Lab Results  Component Value Date   HGBA1C 5.8 (H) 11/09/2022   HGBA1C  12/03/2007    5.5 (NOTE)   The ADA recommends the following therapeutic goal for glycemic   control related to Hgb A1C measurement:   Goal of Therapy:   < 7.0% Hgb A1C   Reference: American Diabetes Association: Clinical Practice   Recommendations 2008, Diabetes Care,  2008, 31:(Suppl 1).   Lab Results  Component Value Date   INSULIN 11.9 01/08/2023   Lab Results  Component Value Date   TSH 2.07 12/27/2022   CBC    Component Value Date/Time   WBC 9.6 11/12/2022 0322   RBC 4.91 11/12/2022 0322   HGB 12.9 11/12/2022 0322   HCT 39.9 11/12/2022 0322   PLT 340 11/12/2022 0322   MCV 81.3 11/12/2022 0322   MCH 26.3 11/12/2022 0322   MCHC  32.3 11/12/2022 0322   RDW 15.3 11/12/2022 0322   Iron Studies    Component Value Date/Time   IRON 51 08/23/2020 0801   TIBC 496 (H) 01/10/2020 1404   FERRITIN 5.3 (L) 08/23/2020 0801   IRONPCTSAT 10.3 (L) 08/23/2020 0801   IRONPCTSAT 5 (L) 05/28/2017 1647   Lipid Panel     Component Value Date/Time   CHOL 142 11/10/2022 0350   CHOL 168 07/31/2021 0905   TRIG 132 11/10/2022 0350   HDL 60 11/10/2022 0350   HDL 67 07/31/2021 0905   CHOLHDL 2.4 11/10/2022 0350   VLDL 26 11/10/2022 0350   LDLCALC 56 11/10/2022 0350   LDLCALC 85 07/31/2021 0905   Hepatic Function Panel     Component Value Date/Time   PROT 7.5 01/29/2023 1016   ALBUMIN 4.3 01/29/2023 1016   AST 18 01/29/2023 1016   ALT 14 01/29/2023 1016   ALKPHOS 70 01/29/2023 1016   BILITOT 0.4 01/29/2023 1016   BILIDIR 0.1 12/11/2019 0852   IBILI 0.6 12/11/2019 0852      Component Value Date/Time   TSH 2.07 12/27/2022 0729   Nutritional Lab Results  Component Value Date   VD25OH 54.40 03/14/2022   VD25OH 33.36 09/30/2017   VD25OH 32.96 06/12/2017     Return in about 3 weeks (around 04/25/2023) for For Weight Mangement with Dr. Rikki Spearing.Marland Kitchen She was informed of the importance of frequent follow up visits to maximize her success with intensive lifestyle modifications for her multiple health conditions.   ATTESTASTION STATEMENTS:  Reviewed by clinician on day of visit: allergies, medications, problem list, medical history, surgical history, family history, social history, and previous encounter notes.     Worthy Rancher, MD

## 2023-04-04 NOTE — Assessment & Plan Note (Signed)
Patient counseled on carb insulin model of obesity today.  She will continue on metformin XR 500 mg twice daily for pharmacoprophylaxis.  No adverse effects reported.  She tells me she will be having blood work as part of her annual physical so we will follow-up on those test results.  Continue to maintain a diet low in simple and added sugars.

## 2023-04-10 DIAGNOSIS — H5711 Ocular pain, right eye: Secondary | ICD-10-CM | POA: Diagnosis not present

## 2023-04-10 DIAGNOSIS — H2513 Age-related nuclear cataract, bilateral: Secondary | ICD-10-CM | POA: Diagnosis not present

## 2023-04-10 DIAGNOSIS — H43813 Vitreous degeneration, bilateral: Secondary | ICD-10-CM | POA: Diagnosis not present

## 2023-04-10 DIAGNOSIS — M3501 Sicca syndrome with keratoconjunctivitis: Secondary | ICD-10-CM | POA: Diagnosis not present

## 2023-04-19 ENCOUNTER — Encounter: Payer: Self-pay | Admitting: Cardiology

## 2023-04-24 ENCOUNTER — Encounter: Payer: Self-pay | Admitting: Family Medicine

## 2023-04-27 ENCOUNTER — Encounter: Payer: Self-pay | Admitting: Pulmonary Disease

## 2023-04-29 MED ORDER — MONTELUKAST SODIUM 10 MG PO TABS: 10.0000 mg | ORAL_TABLET | Freq: Every day | ORAL | 3 refills | Status: DC

## 2023-05-01 ENCOUNTER — Telehealth: Payer: Self-pay | Admitting: *Deleted

## 2023-05-01 DIAGNOSIS — R7303 Prediabetes: Secondary | ICD-10-CM

## 2023-05-01 DIAGNOSIS — E7800 Pure hypercholesterolemia, unspecified: Secondary | ICD-10-CM

## 2023-05-01 NOTE — Telephone Encounter (Signed)
-----   Message from Lovena Neighbours sent at 05/01/2023  1:55 PM EST ----- Regarding: Labs for Wednesday 12.4.24 Please put physical lab orders in future. Thank you, Denny Peon

## 2023-05-02 ENCOUNTER — Other Ambulatory Visit (INDEPENDENT_AMBULATORY_CARE_PROVIDER_SITE_OTHER): Payer: Self-pay | Admitting: Internal Medicine

## 2023-05-02 ENCOUNTER — Encounter: Payer: Self-pay | Admitting: Family Medicine

## 2023-05-02 ENCOUNTER — Encounter (INDEPENDENT_AMBULATORY_CARE_PROVIDER_SITE_OTHER): Payer: Self-pay | Admitting: Internal Medicine

## 2023-05-02 ENCOUNTER — Ambulatory Visit (INDEPENDENT_AMBULATORY_CARE_PROVIDER_SITE_OTHER): Payer: HMO | Admitting: Internal Medicine

## 2023-05-02 VITALS — BP 114/71 | HR 61 | Temp 97.8°F | Ht 66.0 in | Wt 255.0 lb

## 2023-05-02 DIAGNOSIS — E669 Obesity, unspecified: Secondary | ICD-10-CM | POA: Diagnosis not present

## 2023-05-02 DIAGNOSIS — Z6841 Body Mass Index (BMI) 40.0 and over, adult: Secondary | ICD-10-CM

## 2023-05-02 DIAGNOSIS — Z9884 Bariatric surgery status: Secondary | ICD-10-CM

## 2023-05-02 DIAGNOSIS — E66813 Obesity, class 3: Secondary | ICD-10-CM

## 2023-05-02 DIAGNOSIS — R7303 Prediabetes: Secondary | ICD-10-CM | POA: Diagnosis not present

## 2023-05-02 MED ORDER — METFORMIN HCL ER 500 MG PO TB24: 500.0000 mg | ORAL_TABLET | Freq: Two times a day (BID) | ORAL | 0 refills | Status: DC

## 2023-05-02 NOTE — Progress Notes (Signed)
Office: [phone]  /  Fax: [phone]  Weight Summary And Biometrics  Vitals Temp: 97.8 F (36.6 C) BP: 114/71 Pulse Rate: 61 SpO2: 97 %   Anthropometric Measurements Height: 5\' 6"  (1.676 m) Weight: 255 lb (115.7 kg) BMI (Calculated): 41.18 Weight at Last Visit: 259 lb Weight Lost Since Last Visit: 4 lb Weight Gained Since Last Visit: 0 lb Starting Weight: 275 lb Total Weight Loss (lbs): 20 lb (9.072 kg) Peak Weight: 340 lb   Body Composition  Body Fat %: 53 % Fat Mass (lbs): 135.2 lbs Muscle Mass (lbs): 113.8 lbs Total Body Water (lbs): 87.6 lbs Visceral Fat Rating : 18    RMR: [id number]  Today's Visit #: 6  Starting Date: 01/08/23   Subjective   Chief Complaint: Obesity  Kimberly Patton is here to discuss her progress with her obesity treatment plan. She is on the keeping a food journal and adhering to recommended goals of 1200 calories and 90-100 protein and states she is following her eating plan approximately 75-80 % of the time. She states she is not exercising.  Interval History:   Discussed the use of AI scribe software for clinical note transcription with the patient, who gave verbal consent to proceed.  History of Present Illness   Kimberly Patton, a patient with a history of obesity and prediabetes, underwent sleeve gastrectomy in December 2014. At the time of surgery, the patient weighed 340 pounds and managed to lose significant weight, reaching a nadir of 200 pounds approximately 18 months post-surgery. However, following a divorce in 2017, the patient began to regain weight. Since the last office visit, the patient has lost 4 pounds and reports adherence to a 1200 calorie meal plan about 75% of the time. The patient is not currently engaging in any exercise.  The patient expressed frustration with the slow pace of weight loss, comparing her progress unfavorably with a friend who lost 5 pounds in a week. The patient is aware of the differences in metabolism and  the potential for muscle loss with rapid weight loss but remains frustrated with the current rate of weight loss.  The patient also reported experiencing tingling and itchiness, which she attributed to metformin use. In response, the patient increased her daily intake of vitamin B12 from one to two pills, which seemed to alleviate the symptoms somewhat. The patient also reported a decrease in physical activity due to arthritis, which has resulted in inflammation and fatigue.  The patient has noticed weight loss primarily in the upper legs, which is a departure from previous weight loss patterns where weight was typically lost in the chest area first. Despite the patient's perception of slow progress, the overall weight loss trend is downward, with no signs of plateauing. The patient is currently taking metformin and is considering the addition of weight loss medications, although insurance coverage is a concern.       Orexigenic Control:  Denies problems with appetite and hunger signals.  Denies problems with satiety and satiation.  Denies problems with eating patterns and portion control.  Denies abnormal cravings. Denies feeling deprived or restricted.   Barriers identified: none.   Pharmacotherapy for weight loss: She is currently taking Metformin (off label use for incretin effect and / or insulin resistance and / or diabetes prevention) with adequate clinical response  and without side effects..   Assessment and Plan   Treatment Plan For Obesity:  Recommended Dietary Goals  [name] is currently in the action stage of change. As such, her  goal is to continue weight management plan. She has agreed to: continue current plan  Behavioral Intervention  We discussed the following Behavioral Modification Strategies today: continue to work on maintaining a reduced calorie state, getting the recommended amount of protein, incorporating whole foods, making healthy choices, staying well  hydrated and practicing mindfulness when eating..  Additional resources provided today: Handout on traveling and holiday eating strategies  Recommended Physical Activity Goals  Kimberly Patton has been advised to work up to 150 minutes of moderate intensity aerobic activity a week and strengthening exercises 2-3 times per week for cardiovascular health, weight loss maintenance and preservation of muscle mass.   She has agreed to :  Think about enjoyable ways to increase daily physical activity and overcoming barriers to exercise and Increase physical activity in their day and reduce sedentary time (increase NEAT).  We discussed possibly doing chair exercises working on upper body.  Pharmacotherapy  We discussed various medication options to help Kimberly Patton with her weight loss efforts and we both agreed to : continue with nutritional and behavioral strategies  Associated Conditions Addressed Today  Assessment and Plan    Obesity /status post sleeve gastrectomy Kimberly Patton, with a history of sleeve gastrectomy in December 2014, initially decreased from 340 lbs to a nadir of 200 lbs but began regaining weight post-divorce in 2017. She has recently lost 4 lbs by adhering to a 1200 calorie meal plan approximately 75% of the time, without incorporating exercise. We emphasized the importance of gradual weight loss to prevent muscle loss and metabolic slowdown, explaining the body's protective mechanisms against rapid weight loss. Kimberly Patton's frustration with her slow weight loss pace was addressed, reassuring her of her positive trajectory and advising against comparisons with others. The importance of physical activity was discussed, recommending chair exercises or light weight training to maintain muscle mass and boost metabolism. We will continue the 1200 calorie meal plan, recommend chair exercises or light weight training, and provide a handout on managing weight during the holidays.  Prediabetes Kimberly Patton,  presenting with prediabetes and a recent hemoglobin A1c of 5.8%, is on metformin and has increased B12 supplementation to two pills a day to counteract tingling and itchiness. The potential for metformin to lower B12 levels in long-term use was discussed, affecting about 30% of patients. We suggested checking methylmalonic acid levels to assess B12 deficiency and discussed the benefits of metformin in managing prediabetes and weight loss. We will order a methylmalonic acid test, repeat the hemoglobin A1c test, continue metformin, and send a 90-day supply prescription to [facility].  General Health Maintenance Kimberly Patton cholesterol levels are within normal limits. The importance of maintaining physical activity, especially with arthritis, was emphasized, suggesting chair exercises to prevent muscle loss and boost metabolism. The significance of protein intake and managing calorie count for weight loss and muscle maintenance was discussed. We recommend chair exercises or light weight training and continuing B12 supplementation.  Follow-up We will schedule a follow-up appointment in three weeks.          Objective   Physical Exam:  Blood pressure 114/71, pulse 61, temperature 97.8 F (36.6 C), height 5\' 6"  (1.676 m), weight 255 lb (115.7 kg), SpO2 97%. Body mass index is 41.16 kg/m.  General: She is overweight, cooperative, alert, well developed, and in no acute distress. PSYCH: Has normal mood, affect and thought process.   HEENT: EOMI, sclerae are anicteric. Lungs: Normal breathing effort, no conversational dyspnea. Extremities: No edema.  Neurologic: No gross sensory or motor deficits. No tremors  or fasciculations noted.    Diagnostic Data Reviewed:  BMET    Component Value Date/Time   NA 140 01/29/2023 1016   NA 139 01/21/2020 1137   K 4.3 01/29/2023 1016   CL 104 01/29/2023 1016   CO2 28 01/29/2023 1016   GLUCOSE 92 01/29/2023 1016   BUN 20 01/29/2023 1016   BUN 16 01/21/2020 1137    CREATININE 0.58 01/29/2023 1016   CREATININE 0.57 01/01/2020 1623   CALCIUM 9.6 01/29/2023 1016   GFRNONAA >60 11/12/2022 0322   GFRAA >60 02/02/2020 1417   Lab Results  Component Value Date   HGBA1C 5.8 (H) 11/09/2022   HGBA1C  12/03/2007    5.5 (NOTE)   The ADA recommends the following therapeutic goal for glycemic   control related to Hgb A1C measurement:   Goal of Therapy:   < 7.0% Hgb A1C   Reference: American Diabetes Association: Clinical Practice   Recommendations 2008, Diabetes Care,  2008, 31:(Suppl 1).   Lab Results  Component Value Date   INSULIN 11.9 01/08/2023   Lab Results  Component Value Date   TSH 2.07 12/27/2022   CBC    Component Value Date/Time   WBC 9.6 11/12/2022 0322   RBC 4.91 11/12/2022 0322   HGB 12.9 11/12/2022 0322   HCT 39.9 11/12/2022 0322   PLT 340 11/12/2022 0322   MCV 81.3 11/12/2022 0322   MCH 26.3 11/12/2022 0322   MCHC 32.3 11/12/2022 0322   RDW 15.3 11/12/2022 0322   Iron Studies    Component Value Date/Time   IRON 51 08/23/2020 0801   TIBC 496 (H) 01/10/2020 1404   FERRITIN 5.3 (L) 08/23/2020 0801   IRONPCTSAT 10.3 (L) 08/23/2020 0801   IRONPCTSAT 5 (L) 05/28/2017 1647   Lipid Panel     Component Value Date/Time   CHOL 142 11/10/2022 0350   CHOL 168 07/31/2021 0905   TRIG 132 11/10/2022 0350   HDL 60 11/10/2022 0350   HDL 67 07/31/2021 0905   CHOLHDL 2.4 11/10/2022 0350   VLDL 26 11/10/2022 0350   LDLCALC 56 11/10/2022 0350   LDLCALC 85 07/31/2021 0905   Hepatic Function Panel     Component Value Date/Time   PROT 7.5 01/29/2023 1016   ALBUMIN 4.3 01/29/2023 1016   AST 18 01/29/2023 1016   ALT 14 01/29/2023 1016   ALKPHOS 70 01/29/2023 1016   BILITOT 0.4 01/29/2023 1016   BILIDIR 0.1 12/11/2019 0852   IBILI 0.6 12/11/2019 0852      Component Value Date/Time   TSH 2.07 12/27/2022 0729   Nutritional Lab Results  Component Value Date   VD25OH 54.40 03/14/2022   VD25OH 33.36 09/30/2017   VD25OH 32.96  06/12/2017    Follow-Up   No follow-ups on file.Marland Kitchen She was informed of the importance of frequent follow up visits to maximize her success with intensive lifestyle modifications for her multiple health conditions.  Attestation Statement   Reviewed by clinician on day of visit: allergies, medications, problem list, medical history, surgical history, family history, social history, and previous encounter notes.     Worthy Rancher, MD

## 2023-05-06 LAB — METHYLMALONIC ACID, SERUM: Methylmalonic Acid: 93 nmol/L (ref 0–378)

## 2023-05-06 LAB — HEMOGLOBIN A1C
Est. average glucose Bld gHb Est-mCnc: 105 mg/dL
Hgb A1c MFr Bld: 5.3 % (ref 4.8–5.6)

## 2023-05-13 ENCOUNTER — Other Ambulatory Visit: Payer: Self-pay | Admitting: Family Medicine

## 2023-05-13 ENCOUNTER — Ambulatory Visit (INDEPENDENT_AMBULATORY_CARE_PROVIDER_SITE_OTHER): Payer: HMO

## 2023-05-13 VITALS — Ht 66.0 in | Wt 255.0 lb

## 2023-05-13 DIAGNOSIS — R42 Dizziness and giddiness: Secondary | ICD-10-CM

## 2023-05-13 DIAGNOSIS — Z0000 Encounter for general adult medical examination without abnormal findings: Secondary | ICD-10-CM | POA: Diagnosis not present

## 2023-05-13 NOTE — Patient Instructions (Signed)
Kimberly Patton , Thank you for taking time to come for your Medicare Wellness Visit. I appreciate your ongoing commitment to your health goals. Please review the following plan we discussed and let me know if I can assist you in the future.   Referrals/Orders/Follow-Ups/Clinician Recommendations: none  This is a list of the screening recommended for you and due dates:  Health Maintenance  Topic Date Due   DEXA scan (bone density measurement)  08/30/2023   Colon Cancer Screening  05/09/2024   Medicare Annual Wellness Visit  05/12/2024   Mammogram  07/12/2024   DTaP/Tdap/Td vaccine (3 - Td or Tdap) 01/19/2025   Pneumonia Vaccine  Completed   Flu Shot  Completed   Hepatitis C Screening  Completed   HPV Vaccine  Aged Out   COVID-19 Vaccine  Discontinued   Zoster (Shingles) Vaccine  Discontinued    Advanced directives: (Copy Requested) Please bring a copy of your health care power of attorney and living will to the office to be added to your chart at your convenience.  Next Medicare Annual Wellness Visit scheduled for next year: Yes 05/13/2024 @ 9:30am telephone

## 2023-05-13 NOTE — Progress Notes (Signed)
Subjective:   Kimberly Patton is a 67 y.o. female who presents for Medicare Annual (Subsequent) preventive examination.  Visit Complete: Virtual I connected with  Kimberly Patton on 05/13/23 by a audio enabled telemedicine application and verified that I am speaking with the correct person using two identifiers.  Patient Location: Home  Provider Location: Office/Clinic  I discussed the limitations of evaluation and management by telemedicine. The patient expressed understanding and agreed to proceed.  Vital Signs: Because this visit was a virtual/telehealth visit, some criteria may be missing or patient reported. Any vitals not documented were not able to be obtained and vitals that have been documented are patient reported.  Patient Medicare AWV questionnaire was completed by the patient on 05/09/23; I have confirmed that all information answered by patient is correct and no changes since this date. Cardiac Risk Factors include: advanced age (>79men, >24 women);hypertension;sedentary lifestyle;obesity (BMI >30kg/m2);dyslipidemia    Objective:    Today's Vitals   05/09/23 0854 05/13/23 0918  Weight:  255 lb (115.7 kg)  Height:  5\' 6"  (1.676 m)  PainSc: 6     Body mass index is 41.16 kg/m.     05/13/2023    9:31 AM 11/10/2022    4:35 PM 11/09/2022   10:00 AM 11/09/2022    2:06 AM 11/02/2022   11:47 AM 08/30/2022    9:36 PM 08/21/2022    6:32 AM  Advanced Directives  Does Patient Have a Medical Advance Directive? Yes No No No No No No  Type of Estate agent of [place];Living will        Copy of Healthcare Power of Attorney in Chart? No - copy requested        Would patient like information on creating a medical advance directive?  No - Patient declined No - Patient declined    No - Patient declined    Current Medications (verified) Outpatient Encounter Medications as of 05/13/2023  Medication Sig   apixaban (ELIQUIS) 5 MG TABS tablet Take 1 tablet (5 mg  total) by mouth 2 (two) times daily.   Calcium Citrate-Vitamin D (CITRACAL + D PO) Take 2 tablets by mouth daily.    Cholecalciferol (VITAMIN D3) 1.25 MG (50000 UT) CAPS TAKE 1 CAPSULE (50,000 UNITS TOTAL) BY MOUTH ONCE A WEEK. TUESDAY   Coenzyme Q10-Vitamin E (QUNOL ULTRA COQ10) 100-150 MG-UNIT CAPS Take 100 mg by mouth daily.   Cyanocobalamin (VITAMIN B-12) 2500 MCG SUBL Place 2,500 mcg under the tongue daily.   diltiazem (CARDIZEM) 30 MG tablet Take 30 mg by mouth 2 (two) times daily as needed.   esomeprazole (NEXIUM) 40 MG capsule Take 40 mg by mouth daily.   gabapentin (NEURONTIN) 100 MG capsule Take 100 mg by mouth daily.   Lactobacillus Rhamnosus, GG, (CULTURELLE IMMUNITY SUPPORT PO) Take 1 capsule by mouth daily.   levalbuterol (XOPENEX HFA) 45 MCG/ACT inhaler Inhale 2 puffs into the lungs every 6 (six) hours as needed for wheezing.   Menaquinone-7 (VITAMIN K2 PO) Take 100 mg by mouth daily.   metFORMIN (GLUCOPHAGE-XR) 500 MG 24 hr tablet Take 1 tablet (500 mg total) by mouth 2 (two) times daily with a meal.   methocarbamol (ROBAXIN) 500 MG tablet Take 500 mg by mouth at bedtime as needed for muscle spasms.   montelukast (SINGULAIR) 10 MG tablet Take 1 tablet (10 mg total) by mouth daily.   Multiple Vitamins-Minerals (CENTRUM SILVER 50+WOMEN PO) Take 1 tablet by mouth daily.   nitroGLYCERIN (NITROSTAT) 0.4  MG SL tablet Place 1 tablet (0.4 mg total) under the tongue every 5 (five) minutes as needed for chest pain.   Riboflavin 400 MG CAPS Take 400 mg by mouth daily.   rosuvastatin (CRESTOR) 10 MG tablet Take 1 tablet (10 mg total) by mouth daily.   spironolactone (ALDACTONE) 25 MG tablet Take 1 tablet (25 mg total) by mouth daily.   traMADol (ULTRAM) 50 MG tablet Take 1 tablet (50 mg total) by mouth every 6 (six) hours as needed.   TURMERIC PO Take 2 tablets by mouth daily. 500 mg  With Ginger 50 mg Gummies   verapamil (CALAN-SR) 120 MG CR tablet Take 1 tablet (120 mg total) by mouth 2  (two) times daily.   Zoledronic Acid (RECLAST IV) Inject into the vein. Yearly injection   Facility-Administered Encounter Medications as of 05/13/2023  Medication   albuterol (PROVENTIL) (2.5 MG/3ML) 0.083% nebulizer solution 2.5 mg    Allergies (verified) Wasp venom, Chlorhexidine gluconate, Metronidazole, Prednisone, Other, and Sodium hypochlorite   History: Past Medical History:  Diagnosis Date   ALLERGIC RHINITIS 04/03/2007   Allergy    Not sure   Anemia    ((Pt Qnr Sub: Denies at visit from 08/28/2021 with endocrinology))    Anginal pain (HCC)    Anxiety    Arthritis Not sure   Asthma    hx of years ago - no longer a problem    ASTHMA, PERSISTENT, MODERATE 04/03/2007   Back pain    Cancer (HCC)    skin    Chest pain    Depression screen 01/08/2023   Dyspnea    with exertion   Dyspnea on exertion    a. 11/2007 Echo: EF 60%.   Dysrhythmia    hx of heart arrhythmia 10-15 years ago - followed by DR Ladona Ridgel - not seen in years    Empty sella syndrome (HCC)    GERD (gastroesophageal reflux disease)    Headache    Hemophilia carrier    Hiatal hernia    History of kidney stones    Hypercholesteremia 06/09/2019   Hypertension    Joint pain    HIPS / LEGS   Joint pain    Knee injury    RT   Lymph edema    bilateral legs   Meningioma (HCC)    Morbid obesity (HCC)    Neuromuscular disorder (HCC) Not sure, since my fall   Occipital neuralgia    Osteoarthritis    Palpitations    Paroxysmal SVT (supraventricular tachycardia) (HCC)    a. 11/2011 48h Holter: RSR, rare PVC's, occas PAC's   Retinal tear of right eye 01/2016   SOB (shortness of breath)    Thyroid nodule 04/15/2018   Ventral hernia    Past Surgical History:  Procedure Laterality Date   ABDOMINAL HYSTERECTOMY  Think 1994   APPENDECTOMY  1996   ARTERY BIOPSY Left 03/21/2022   Procedure: BIOPSY TEMPORAL ARTERY;  Surgeon: Renford Dills, MD;  Location: [facility];  Service: Vascular;  Laterality: Left;    BIOPSY THYROID     08/2021   BREAST CYST EXCISION Right 1994   Benign   CARDIOVERSION N/A 11/12/2022   Procedure: CARDIOVERSION;  Surgeon: Antonieta Iba, MD;  Location: [facility];  Service: Cardiovascular;  Laterality: N/A;   CESAREAN SECTION     x2   CHOLECYSTECTOMY  1995   COLONOSCOPY WITH PROPOFOL N/A 05/09/2021   Procedure: COLONOSCOPY WITH PROPOFOL;  Surgeon: Wyline Mood, MD;  Location: [facility]  ENDOSCOPY;  Service: Gastroenterology;  Laterality: N/A;   ESOPHAGOGASTRODUODENOSCOPY (EGD) WITH PROPOFOL N/A 11/02/2022   Procedure: ESOPHAGOGASTRODUODENOSCOPY (EGD) WITH PROPOFOL;  Surgeon: Regis Bill, MD;  Location: [facility] END[facility]COPY;  Service: Endoscopy;  Laterality: N/A;   EYE SURGERY  01/2016   Repair retinal tear   FOOT SURGERY  1995 / 1996   x2   HERNIA REPAIR     KNEE ARTHROSCOPY W/ MENISCAL REPAIR  07/25/2013   right knee Dr. Dion Saucier   LAPAROSCOPIC GASTRIC SLEEVE RESECTION N/A 06/08/2013   Procedure: LAPAROSCOPIC GASTRIC SLEEVE RESECTION AND EXCISION OF SEBACEUS CYST FROM MID CHEST takedown of incarcerated ventral hernia and primary repair endoscopy;  Surgeon: Valarie Merino, MD;  Location: [facility];  Service: General;  Laterality: N/A;   LAPAROSCOPIC NISSEN FUNDOPLICATION N/A 02/02/2020   Procedure: LAPAROSCOPIC REPAIR LARGE SYMPTOMATIC HIATAL HERNIA WITH UPPER ENDOSCOPY;  Surgeon: Luretha Murphy, MD;  Location: [facility];  Service: General;  Laterality: N/A;   moles  06/2013   removed 2 moles from under arm and  lowback   NECK SURGERY     occipital nerve damage- injections    OVARIAN CYST REMOVAL  1970   PILONIDAL CYST EXCISION  1975   RADIOACTIVE SEED GUIDED EXCISIONAL BREAST BIOPSY Right 06/23/2021   Procedure: RADIOACTIVE SEED GUIDED EXCISIONAL RIGHT BREAST BIOPSY;  Surgeon: Luretha Murphy, MD;  Location: [facility];  Service: General;  Laterality: Right;   RETINAL TEAR REPAIR CRYOTHERAPY Right 01/2016   [place]   SKIN CANCER EXCISION     TEE WITHOUT  CARDIOVERSION N/A 11/12/2022   Procedure: TRANSESOPHAGEAL ECHOCARDIOGRAM;  Surgeon: Antonieta Iba, MD;  Location: [facility];  Service: Cardiovascular;  Laterality: N/A;   THYROID LOBECTOMY Left 08/21/2022   Procedure: LEFT THYROID LOBECTOMY;  Surgeon: Darnell Level, MD;  Location: [facility];  Service: General;  Laterality: Left;   THYROIDECTOMY, PARTIAL     TONSILLECTOMY     TUBAL LIGATION  Think 1994   UPPER GI ENDOSCOPY  06/08/2013   Procedure: UPPER GI ENDOSCOPY;  Surgeon: Valarie Merino, MD;  Location: [facility];  Service: General;;   US ECHOCARDIOGRAPHY  12/2011   WNL - EF 55-60%, mild MR, grade 1 diastolic dysnfiction (mild)   VENTRAL HERNIA REPAIR  2000   Family History  Problem Relation Age of Onset   Breast cancer Mother    Diabetes Mother    Hypertension Mother    Kidney failure Mother    Heart disease Mother    Obesity Mother    Diabetes Father    Hypertension Father    Heart disease Father    Diabetes Other    Hypertension Other    Stroke Other    Hemophilia Other    Social History   Socioeconomic History   Marital status: Divorced    Spouse name: Not on file   Number of children: 2   Years of education: 17   Highest education level: Bachelor's degree (e.g., BA, AB, BS)  Occupational History   Occupation: Magazine features editor: [employer]   Occupation: Retired Runner, broadcasting/film/video  Tobacco Use   Smoking status: Never   Smokeless tobacco: Never   Tobacco comments:    smoke at age [age]  Vaping Use   Vaping status: Never Used  Substance and Sexual Activity   Alcohol use: Not Currently   Drug use: No   Sexual activity: Not Currently    Partners: Male    Birth control/protection: Abstinence, Pill  Other Topics Concern  Not on file  Social History Narrative   Lives at home alone.   Right-handed.   Occasional caffeine use.   Social Determinants of Health   Financial Resource Strain: Low Risk  (05/09/2023)   Overall Financial Resource Strain (CARDIA)     Difficulty of Paying Living Expenses: Not hard at all  Food Insecurity: No Food Insecurity (05/09/2023)   Hunger Vital Sign    Worried About Running Out of Food in the Last Year: Never true    Ran Out of Food in the Last Year: Never true  Transportation Needs: No Transportation Needs (05/09/2023)   PRAPARE - Administrator, Civil Service (Medical): No    Lack of Transportation (Non-Medical): No  Physical Activity: Inactive (05/09/2023)   Exercise Vital Sign    Days of Exercise per Week: 0 days    Minutes of Exercise per Session: 10 min  Stress: No Stress Concern Present (05/09/2023)   [employer] of Occupational Health - Occupational Stress Questionnaire    Feeling of Stress : Not at all  Social Connections: Moderately Integrated (05/09/2023)   Social Connection and Isolation Panel [NHANES]    Frequency of Communication with Friends and Family: Twice a week    Frequency of Social Gatherings with Friends and Family: Once a week    Attends Religious Services: More than 4 times per year    Active Member of [employer] or Organizations: Yes    Attends Banker Meetings: 1 to 4 times per year    Marital Status: Divorced    Tobacco Counseling Counseling given: Not Answered Tobacco comments: smoke at age 64-12   Clinical Intake:  Pre-visit preparation completed: No  Pain : 0-10 Pain Score: 6  Pain Type: Chronic pain Pain Location: Back (neck, rt knee) Pain Orientation: Lower Pain Descriptors / Indicators: Aching Pain Onset: More than a month ago Pain Frequency: Intermittent Pain Relieving Factors: tylenol arthritis; stretching, chair with heat to lower back Effect of Pain on Daily Activities: limits  Pain Relieving Factors: tylenol arthritis; stretching, chair with heat to lower back  BMI - recorded: 41.16 Nutritional Status: BMI > 30  Obese Nutritional Risks: None Diabetes: No  How often do you need to have someone help you when you read  instructions, pamphlets, or other written materials from your doctor or pharmacy?: 1 - Never  Interpreter Needed?: No  Comments: oldest son lives with pt Information entered by :: B.Addalynne Golding,LPN   Activities of Daily Living    05/09/2023    8:54 AM 11/09/2022   10:00 AM  In your present state of health, do you have any difficulty performing the following activities:  Hearing? 0 0  Vision? 0 0  Difficulty concentrating or making decisions? 0 0  Walking or climbing stairs? 1 1  Dressing or bathing? 0 0  Doing errands, shopping? 0 0  Preparing Food and eating ? N   Using the Toilet? N   In the past six months, have you accidently leaked urine? N   Do you have problems with loss of bowel control? N   Managing your Medications? N   Managing your Finances? N   Housekeeping or managing your Housekeeping? Y     Patient Care Team: Excell Seltzer, MD as PCP - General (Family Medicine) Debbe Odea, MD as PCP - Cardiology (Cardiology) Lanier Prude, MD as PCP - Electrophysiology (Cardiology) Heniford, Rodolph Bong, MD as Referring Physician (Surgery) Salena Saner, MD as Consulting Physician (Pulmonary Disease) Darnell Level,  MD as Consulting Physician (General Surgery) Galen Manila, MD as Referring Physician (Ophthalmology)  Indicate any recent Medical Services you may have received from other than [facility] providers in the past year (date may be approximate).     Assessment:   This is a routine wellness examination for Lydia.  Hearing/Vision screen Hearing Screening - Comments:: Pt says she has tinnitus but hears good otherwise Vision Screening - Comments:: Pt says her vision has little blurriness in rt eye [facility]-Dr Porfilio-seen every 3-6 months   Goals Addressed               This Visit's Progress     Lose weight (pt-stated)   On track     Pt is going to [facility] in [place]; she wants continue this goals       Depression Screen     05/13/2023    9:30 AM 05/08/2022    3:29 PM 03/09/2021    2:49 PM 02/17/2020    1:37 PM 06/09/2019   12:39 PM 10/04/2017    2:45 PM 12/07/2016    9:23 AM  PHQ 2/9 Scores  PHQ - 2 Score 0 0 0 1 0 0 2  PHQ- 9 Score     4  5    Fall Risk    05/09/2023    8:54 AM 05/08/2022    3:30 PM 03/28/2022    8:46 AM 03/09/2021    2:06 PM 02/17/2020    1:37 PM  Fall Risk   Falls in the past year? 0 0 0 1 0  Number falls in past yr: 0 0 0 1   Injury with Fall?  0  1   Risk for fall due to : No Fall Risks No Fall Risks     Follow up Falls prevention discussed;Education provided Falls prevention discussed       MEDICARE RISK AT HOME: Medicare Risk at Home Any stairs in or around the home?: Yes If so, are there any without handrails?: No Home free of loose throw rugs in walkways, pet beds, electrical cords, etc?: No Adequate lighting in your home to reduce risk of falls?: Yes Life alert?: No Use of a cane, walker or w/c?: Yes Grab bars in the bathroom?: No Shower chair or bench in shower?: Yes Elevated toilet seat or a handicapped toilet?: Yes  TIMED UP AND GO:  Was the test performed?  No    Cognitive Function:        05/13/2023    9:35 AM 05/08/2022    3:31 PM  6CIT Screen  What Year? 0 points 0 points  What month? 0 points 0 points  What time? 0 points 0 points  Count back from 20 0 points 0 points  Months in reverse 0 points 0 points  Repeat phrase 0 points 0 points  Total Score 0 points 0 points    Immunizations Immunization History  Administered Date(s) Administered   Fluad Quad(high Dose 65+) 03/09/2021, 05/12/2022   Fluad Trivalent(High Dose 65+) 03/29/2023   Influenza Inj Mdck Quad Pf 02/13/2018   Influenza Whole 06/11/2005, 04/08/2009   Influenza,inj,Quad PF,6+ Mos 03/22/2017, 02/13/2018, 03/03/2019, 04/08/2020   Influenza-Unspecified 03/27/2016   PFIZER(Purple Top)SARS-COV-2 Vaccination 08/07/2019, 08/29/2019   PNEUMOCOCCAL CONJUGATE-20 03/09/2021   Pneumococcal  Polysaccharide-23 03/22/2017   Td 06/11/2004   Tdap 01/20/2015    TDAP status: Up to date  Flu Vaccine status: Up to date  Pneumococcal vaccine status: Up to date  Covid-19 vaccine status: Completed vaccines  Qualifies for Shingles Vaccine? Yes   Zostavax completed No   Shingrix Completed?: No.    Education has been provided regarding the importance of this vaccine. Patient has been advised to call insurance company to determine out of pocket expense if they have not yet received this vaccine. Advised may also receive vaccine at local pharmacy or Health Dept. Verbalized acceptance and understanding.  Screening Tests Health Maintenance  Topic Date Due   DEXA SCAN  08/30/2023   Colonoscopy  05/09/2024   Medicare Annual Wellness (AWV)  05/12/2024   MAMMOGRAM  07/12/2024   DTaP/Tdap/Td (3 - Td or Tdap) 01/19/2025   Pneumonia Vaccine 60+ Years old  Completed   INFLUENZA VACCINE  Completed   Hepatitis C Screening  Completed   HPV VACCINES  Aged Out   COVID-19 Vaccine  Discontinued   Zoster Vaccines- Shingrix  Discontinued    Health Maintenance  There are no preventive care reminders to display for this patient.   Colorectal cancer screening: Type of screening: Colonoscopy. Completed 05/09/2021. Repeat every 5-10 years  Mammogram status: Completed 07/12/22. Repeat every year scheduled Feb2025  Bone Density status: Completed 08/29/2021. Results reflect: Bone density results: OSTEOPOROSIS. Repeat every 3-5 years. Scheduled March 2025  Lung Cancer Screening: (Low Dose CT Chest recommended if Age 94-80 years, 20 pack-year currently smoking OR have quit w/in 15years.) does not qualify.   Lung Cancer Screening Referral: no  Additional Screening:  Hepatitis C Screening: does not qualify; Completed 01/13/2015  Vision Screening: Recommended annual ophthalmology exams for early detection of glaucoma and other disorders of the eye. Is the patient up to date with their annual eye exam?   Yes  Who is the provider or what is the name of the office in which the patient attends annual eye exams? Dr Druscilla Brownie If pt is not established with a provider, would they like to be referred to a provider to establish care? No .   Dental Screening: Recommended annual dental exams for proper oral hygiene  Diabetic Foot Exam: n/a  Community Resource Referral / Chronic Care Management: CRR required this visit?  No   CCM required this visit?  No    Plan:     I have personally reviewed and noted the following in the patient's chart:   Medical and social history Use of alcohol, tobacco or illicit drugs  Current medications and supplements including opioid prescriptions. Patient is not currently taking opioid prescriptions. Functional ability and status Nutritional status Physical activity Advanced directives List of other physicians Hospitalizations, surgeries, and ER visits in previous 12 months Vitals Screenings to include cognitive, depression, and falls Referrals and appointments  In addition, I have reviewed and discussed with patient certain preventive protocols, quality metrics, and best practice recommendations. A written personalized care plan for preventive services as well as general preventive health recommendations were provided to patient.    Sue Lush, LPN   16/06/958   After Visit Summary: (MyChart) Due to this being a telephonic visit, the after visit summary with patients personalized plan was offered to patient via MyChart   Nurse Notes: Pt relays she has been having lightheadedness the last three days. She somehow wonders if her "iron is low". Pt expresses she would like her thyroid and "iron" level checked/drawn with labs on 05/15/23. She has no other concerns or questions.

## 2023-05-14 ENCOUNTER — Encounter: Payer: Self-pay | Admitting: Pulmonary Disease

## 2023-05-15 ENCOUNTER — Telehealth: Payer: Self-pay | Admitting: Family Medicine

## 2023-05-15 ENCOUNTER — Encounter: Payer: Self-pay | Admitting: Family Medicine

## 2023-05-15 ENCOUNTER — Other Ambulatory Visit (INDEPENDENT_AMBULATORY_CARE_PROVIDER_SITE_OTHER): Payer: HMO

## 2023-05-15 DIAGNOSIS — E7800 Pure hypercholesterolemia, unspecified: Secondary | ICD-10-CM

## 2023-05-15 DIAGNOSIS — R7303 Prediabetes: Secondary | ICD-10-CM | POA: Diagnosis not present

## 2023-05-15 DIAGNOSIS — R42 Dizziness and giddiness: Secondary | ICD-10-CM | POA: Diagnosis not present

## 2023-05-15 LAB — CBC WITH DIFFERENTIAL/PLATELET
Basophils Absolute: 0 10*3/uL (ref 0.0–0.1)
Basophils Relative: 0.7 % (ref 0.0–3.0)
Eosinophils Absolute: 0.1 10*3/uL (ref 0.0–0.7)
Eosinophils Relative: 2.1 % (ref 0.0–5.0)
HCT: 38.9 % (ref 36.0–46.0)
Hemoglobin: 12.6 g/dL (ref 12.0–15.0)
Lymphocytes Relative: 28.3 % (ref 12.0–46.0)
Lymphs Abs: 1.9 10*3/uL (ref 0.7–4.0)
MCHC: 32.3 g/dL (ref 30.0–36.0)
MCV: 82 fL (ref 78.0–100.0)
Monocytes Absolute: 0.5 10*3/uL (ref 0.1–1.0)
Monocytes Relative: 7 % (ref 3.0–12.0)
Neutro Abs: 4.2 10*3/uL (ref 1.4–7.7)
Neutrophils Relative %: 61.9 % (ref 43.0–77.0)
Platelets: 315 10*3/uL (ref 150.0–400.0)
RBC: 4.75 Mil/uL (ref 3.87–5.11)
RDW: 17.2 % — ABNORMAL HIGH (ref 11.5–15.5)
WBC: 6.7 10*3/uL (ref 4.0–10.5)

## 2023-05-15 LAB — COMPREHENSIVE METABOLIC PANEL
ALT: 14 U/L (ref 0–35)
AST: 16 U/L (ref 0–37)
Albumin: 4.3 g/dL (ref 3.5–5.2)
Alkaline Phosphatase: 81 U/L (ref 39–117)
BUN: 20 mg/dL (ref 6–23)
CO2: 31 meq/L (ref 19–32)
Calcium: 9.7 mg/dL (ref 8.4–10.5)
Chloride: 102 meq/L (ref 96–112)
Creatinine, Ser: 0.55 mg/dL (ref 0.40–1.20)
GFR: 94.76 mL/min (ref >60.00)
Glucose, Bld: 83 mg/dL (ref 70–99)
Potassium: 4.2 meq/L (ref 3.5–5.1)
Sodium: 141 meq/L (ref 135–145)
Total Bilirubin: 0.5 mg/dL (ref 0.2–1.2)
Total Protein: 7.2 g/dL (ref 6.0–8.3)

## 2023-05-15 LAB — LIPID PANEL
Cholesterol: 161 mg/dL (ref 0–200)
HDL: 56.2 mg/dL (ref >39.00)
LDL Cholesterol: 80 mg/dL (ref 0–99)
NonHDL: 104.69
Total CHOL/HDL Ratio: 3
Triglycerides: 124 mg/dL (ref 0.0–149.0)
VLDL: 24.8 mg/dL (ref 0.0–40.0)

## 2023-05-15 LAB — IBC + FERRITIN
Ferritin: 4.7 ng/mL — ABNORMAL LOW (ref 10.0–291.0)
Iron: 186 ug/dL — ABNORMAL HIGH (ref 42–145)
Saturation Ratios: 34.1 % (ref 20.0–50.0)
TIBC: 546 ug/dL — ABNORMAL HIGH (ref 250.0–450.0)
Transferrin: 390 mg/dL — ABNORMAL HIGH (ref 212.0–360.0)

## 2023-05-15 LAB — TSH: TSH: 1.69 u[IU]/mL (ref 0.35–5.50)

## 2023-05-15 LAB — HEMOGLOBIN A1C: Hgb A1c MFr Bld: 5.6 % (ref 4.6–6.5)

## 2023-05-15 LAB — T3, FREE: T3, Free: 3.6 pg/mL (ref 2.3–4.2)

## 2023-05-15 LAB — T4, FREE: Free T4: 0.91 ng/dL (ref 0.60–1.60)

## 2023-05-15 NOTE — Telephone Encounter (Signed)
Patient stated that she seen her lab results from today. She stated that her iron was elevated and she wants to know if she can take something in the meantime because she is cold. She stated that she has the heat on and wearing a heating pad and still cold. Please advise. Thank you!

## 2023-05-15 NOTE — Telephone Encounter (Signed)
She is not on inhaled corticosteroids due to intolerance I believe she is only on Xopenex and montelukast none of these should cause thrush.  We had we have not prescribed antibiotics which is another cause, she may need to be evaluated by primary care to see what is going on.

## 2023-05-16 NOTE — Telephone Encounter (Signed)
I would not recommend taking anything given total iron is high and transferring iron is high, only stored iron is low.  I do not think this is making her cold.  The labs suggest her body is working on storing the iron that she is taking in. She can continue high iron foods in her diet. We can reevaluate at her upcoming appointment

## 2023-05-16 NOTE — Telephone Encounter (Signed)
Dr. Ermalene Searing relayed this information to patient via MyChart.  Patient has reviewed the message.

## 2023-05-17 ENCOUNTER — Encounter (INDEPENDENT_AMBULATORY_CARE_PROVIDER_SITE_OTHER): Payer: Self-pay | Admitting: Internal Medicine

## 2023-05-22 ENCOUNTER — Ambulatory Visit (INDEPENDENT_AMBULATORY_CARE_PROVIDER_SITE_OTHER): Payer: HMO | Admitting: Family Medicine

## 2023-05-22 ENCOUNTER — Encounter: Payer: Self-pay | Admitting: Family Medicine

## 2023-05-22 VITALS — BP 114/70 | HR 79 | Temp 98.3°F | Ht 66.0 in | Wt 261.8 lb

## 2023-05-22 DIAGNOSIS — E236 Other disorders of pituitary gland: Secondary | ICD-10-CM | POA: Diagnosis not present

## 2023-05-22 DIAGNOSIS — R42 Dizziness and giddiness: Secondary | ICD-10-CM

## 2023-05-22 DIAGNOSIS — Z0000 Encounter for general adult medical examination without abnormal findings: Secondary | ICD-10-CM | POA: Diagnosis not present

## 2023-05-22 DIAGNOSIS — R790 Abnormal level of blood mineral: Secondary | ICD-10-CM

## 2023-05-22 DIAGNOSIS — J4520 Mild intermittent asthma, uncomplicated: Secondary | ICD-10-CM

## 2023-05-22 DIAGNOSIS — I480 Paroxysmal atrial fibrillation: Secondary | ICD-10-CM | POA: Diagnosis not present

## 2023-05-22 DIAGNOSIS — I10 Essential (primary) hypertension: Secondary | ICD-10-CM

## 2023-05-22 DIAGNOSIS — D329 Benign neoplasm of meninges, unspecified: Secondary | ICD-10-CM | POA: Diagnosis not present

## 2023-05-22 DIAGNOSIS — M810 Age-related osteoporosis without current pathological fracture: Secondary | ICD-10-CM

## 2023-05-22 DIAGNOSIS — E7800 Pure hypercholesterolemia, unspecified: Secondary | ICD-10-CM | POA: Diagnosis not present

## 2023-05-22 LAB — IBC + FERRITIN
Ferritin: 5 ng/mL — ABNORMAL LOW (ref 10.0–291.0)
Iron: 37 ug/dL — ABNORMAL LOW (ref 42–145)
Saturation Ratios: 7 % — ABNORMAL LOW (ref 20.0–50.0)
TIBC: 525 ug/dL — ABNORMAL HIGH (ref 250.0–450.0)
Transferrin: 375 mg/dL — ABNORMAL HIGH (ref 212.0–360.0)

## 2023-05-22 LAB — VITAMIN B12: Vitamin B-12: 1057 pg/mL — ABNORMAL HIGH (ref 211–911)

## 2023-05-22 NOTE — Assessment & Plan Note (Addendum)
Stable control on diltiazem S SR 120 mg BID, spironolactone 25 mg p.o. daily

## 2023-05-22 NOTE — Assessment & Plan Note (Signed)
Stable control, followed by pulmonary.

## 2023-05-22 NOTE — Progress Notes (Signed)
Patient ID: Kimberly Patton, female    DOB: 27-Jul-1955, 67 y.o.   MRN: [id number]  This visit was conducted in person.  BP 114/70   Pulse 79   Temp 98.3 F (36.8 C) (Oral)   Ht 5\' 6"  (1.676 m)   Wt 261 lb 12.8 oz (118.8 kg)   SpO2 94%   BMI 42.26 kg/m    CC:  Chief Complaint  Patient presents with   Annual Exam    Patients states that she is having pain on both sides from under her arm down. Chiropractor thinks it may be a rib slipping.    Subjective:   HPI: Kimberly Patton is a 67 y.o. female presenting on 05/22/2023 for Annual Exam (Patients states that she is having pain on both sides from under her arm down. Chiropractor thinks it may be a rib slipping.)  The patient presents for complete physical and review of chronic health problems. He/She also has the following acute concerns today:  She has been feeling very cold in last few weeks  Pain bilaterally with underarms on both sides... seeing chiropractor  The patient saw a LPN or RN for medicare wellness visit. 05/13/2023  Prevention and wellness was reviewed in detail. Note reviewed and important notes copied below.   Reviewed labs in detail with patient.   Atrial fibrillation in the context of steroid administration, 2-week cardiac monitor placed, no evidence of A-fib noted.  Will not recommend long-term anticoagulation at this point as this is likely a lone event in the context of steroid administration, temporal artery biopsy.   CAD  Cardiology : Reviewed last office visit from February 18, 2023 with Dr.Agbohr Margarette Asal Readings from Last 3 Encounters:  05/22/23 261 lb 12.8 oz (118.8 kg)  05/13/23 255 lb (115.7 kg)  05/02/23 255 lb (115.7 kg)    Hypertension:  Stable control on diltiazem S SR 120 mg BID,  aldactone 25 mg daily BP Readings from Last 3 Encounters:  05/22/23 114/70  05/02/23 114/71  04/04/23 116/77  Using medication without problems or lightheadedness:  none Chest pain with  exertion:none Edema:none Short of breath: none Average home BPs: Other issues:   Elevated Cholesterol:  LDL almost  at goal < 70  Hx of CAD...  crestor 10 mg daily  Has been eating more eggs. Recent Labs   Lab Results  Component Value Date   CHOL 161 05/15/2023   HDL 56.20 05/15/2023   LDLCALC 80 05/15/2023   TRIG 124.0 05/15/2023   CHOLHDL 3 05/15/2023  Using medications without problems: Muscle aches:  Diet compliance:  working on heart healthy diet Exercise:  limited in rehab Other complaints:   Wt Readings from Last 3 Encounters:  05/22/23 261 lb 12.8 oz (118.8 kg)  05/13/23 255 lb (115.7 kg)  05/02/23 255 lb (115.7 kg)     Mild intermittent asthma Followed by Pulmonary Dr. Marcos Eke.   Prediabetes Lab Results  Component Value Date   HGBA1C 5.6 05/15/2023   Now on metfomrin for weight loss.  Wegovy not covered.     Relevant past medical, surgical, family and social history reviewed and updated as indicated. Interim medical history since our last visit reviewed. Allergies and medications reviewed and updated. Outpatient Medications Prior to Visit  Medication Sig Dispense Refill   apixaban (ELIQUIS) 5 MG TABS tablet Take 1 tablet (5 mg total) by mouth 2 (two) times daily. 60 tablet 11   Calcium Citrate-Vitamin D (CITRACAL + D PO) Take 2  tablets by mouth daily.      Cholecalciferol (VITAMIN D3) 1.25 MG (50000 UT) CAPS TAKE 1 CAPSULE (50,000 UNITS TOTAL) BY MOUTH ONCE A WEEK. TUESDAY 12 capsule 0   Coenzyme Q10-Vitamin E (QUNOL ULTRA COQ10) 100-150 MG-UNIT CAPS Take 100 mg by mouth daily.     Cyanocobalamin (VITAMIN B-12) 2500 MCG SUBL Place 2,500 mcg under the tongue daily.     diltiazem (CARDIZEM) 30 MG tablet Take 30 mg by mouth 2 (two) times daily as needed.     esomeprazole (NEXIUM) 40 MG capsule Take 40 mg by mouth daily.     gabapentin (NEURONTIN) 100 MG capsule Take 100 mg by mouth daily.     Lactobacillus Rhamnosus, GG, (CULTURELLE IMMUNITY SUPPORT PO)  Take 1 capsule by mouth daily.     levalbuterol (XOPENEX HFA) 45 MCG/ACT inhaler Inhale 2 puffs into the lungs every 6 (six) hours as needed for wheezing. 1 each 2   Menaquinone-7 (VITAMIN K2 PO) Take 100 mg by mouth daily.     metFORMIN (GLUCOPHAGE-XR) 500 MG 24 hr tablet Take 1 tablet (500 mg total) by mouth 2 (two) times daily with a meal. 180 tablet 0   methocarbamol (ROBAXIN) 500 MG tablet Take 500 mg by mouth at bedtime as needed for muscle spasms.     montelukast (SINGULAIR) 10 MG tablet Take 1 tablet (10 mg total) by mouth daily. 90 tablet 3   Multiple Vitamins-Minerals (CENTRUM SILVER 50+WOMEN PO) Take 1 tablet by mouth daily.     nitroGLYCERIN (NITROSTAT) 0.4 MG SL tablet Place 1 tablet (0.4 mg total) under the tongue every 5 (five) minutes as needed for chest pain. 25 tablet 3   Riboflavin 400 MG CAPS Take 400 mg by mouth daily.     rosuvastatin (CRESTOR) 10 MG tablet Take 1 tablet (10 mg total) by mouth daily. 90 tablet 3   spironolactone (ALDACTONE) 25 MG tablet Take 1 tablet (25 mg total) by mouth daily. 90 tablet 3   traMADol (ULTRAM) 50 MG tablet Take 1 tablet (50 mg total) by mouth every 6 (six) hours as needed. 12 tablet 0   TURMERIC PO Take 2 tablets by mouth daily. 500 mg  With Ginger 50 mg Gummies     verapamil (CALAN-SR) 120 MG CR tablet Take 1 tablet (120 mg total) by mouth 2 (two) times daily. 180 tablet 3   Zoledronic Acid (RECLAST IV) Inject into the vein. Yearly injection     Facility-Administered Medications Prior to Visit  Medication Dose Route Frequency Provider Last Rate Last Admin   albuterol (PROVENTIL) (2.5 MG/3ML) 0.083% nebulizer solution 2.5 mg  2.5 mg Nebulization Once Salena Saner, MD         Per HPI unless specifically indicated in ROS section below Review of Systems  Constitutional:  Negative for fatigue and fever.  HENT:  Negative for congestion.   Eyes:  Negative for pain.  Respiratory:  Negative for cough and shortness of breath.    Cardiovascular:  Negative for chest pain, palpitations and leg swelling.  Gastrointestinal:  Negative for abdominal pain.  Genitourinary:  Negative for dysuria and vaginal bleeding.  Musculoskeletal:  Negative for back pain.  Neurological:  Negative for syncope, light-headedness and headaches.  Psychiatric/Behavioral:  Negative for dysphoric mood.    Objective:  BP 114/70   Pulse 79   Temp 98.3 F (36.8 C) (Oral)   Ht 5\' 6"  (1.676 m)   Wt 261 lb 12.8 oz (118.8 kg)   SpO2 94%  BMI 42.26 kg/m   Wt Readings from Last 3 Encounters:  05/22/23 261 lb 12.8 oz (118.8 kg)  05/13/23 255 lb (115.7 kg)  05/02/23 255 lb (115.7 kg)      Physical Exam Vitals and nursing note reviewed.  Constitutional:      General: She is not in acute distress.    Appearance: Normal appearance. She is well-developed. She is obese. She is not ill-appearing or toxic-appearing.  HENT:     Head: Normocephalic.     Right Ear: Hearing, tympanic membrane, ear canal and external ear normal.     Left Ear: Hearing, tympanic membrane, ear canal and external ear normal.     Nose: Nose normal.  Eyes:     General: Lids are normal. Lids are everted, no foreign bodies appreciated.     Conjunctiva/sclera: Conjunctivae normal.     Pupils: Pupils are equal, round, and reactive to light.  Neck:     Thyroid: No thyroid mass or thyromegaly.     Vascular: No carotid bruit.     Trachea: Trachea normal.  Cardiovascular:     Rate and Rhythm: Normal rate and regular rhythm.     Heart sounds: Normal heart sounds, S1 normal and S2 normal. No murmur heard.    No gallop.  Pulmonary:     Effort: Pulmonary effort is normal. No respiratory distress.     Breath sounds: Normal breath sounds. No wheezing, rhonchi or rales.  Chest:     Comments: TTP bilateral chest wall under arms Abdominal:     General: Bowel sounds are normal. There is no distension or abdominal bruit.     Palpations: Abdomen is soft. There is no fluid wave or  mass.     Tenderness: There is no abdominal tenderness. There is no guarding or rebound.     Hernia: No hernia is present.  Musculoskeletal:     Cervical back: Normal range of motion and neck supple.  Lymphadenopathy:     Cervical: No cervical adenopathy.     Upper Body:     Right upper body: No supraclavicular, axillary or pectoral adenopathy.     Left upper body: No supraclavicular, axillary or pectoral adenopathy.  Skin:    General: Skin is warm and dry.     Findings: No rash.  Neurological:     Mental Status: She is alert.     Cranial Nerves: No cranial nerve deficit.     Sensory: No sensory deficit.  Psychiatric:        Mood and Affect: Mood is not anxious or depressed.        Speech: Speech normal.        Behavior: Behavior normal. Behavior is cooperative.        Judgment: Judgment normal.          COVID 19 screen:  No recent travel or known exposure to COVID19 The patient denies respiratory symptoms of COVID 19 at this time. The importance of social distancing was discussed today.   Assessment and Plan   The patient's preventative maintenance and recommended screening tests for an annual wellness exam were reviewed in full today. Brought up to date unless services declined.  Counselled on the importance of diet, exercise, and its role in overall health and mortality. The patient's FH and SH was reviewed, including their home life, tobacco status, and drug and alcohol status.   Vaccines:Uptodate high dose flu , Td uptodate. refused shingles vaccine. S/P  COVID vaccine x 2 Consider RSV.  Pap/DVE:  2016  neg pap, neg HPV.. , no family history of endometrial/ovarian cancer..  no further indicated Mammo: 07/2022.. repeat due, s/p lumpectomy Bone Density:  osteoporosis  SE to prolia, actonel EC.. followed by ENDO On Reclast. 06/2022... had SE initially, not sure if gping to repeat. Colon: 04/2021 repeat in 3 years Smoking Status:nonsmoker ETOH/ drug use:  Rare wine/no  drugs  Hep C: done  HIV screen:  done  Problem List Items Addressed This Visit     Age-related osteoporosis without current pathological fracture   Relevant Orders   DG Bone Density   Asthma, mild intermittent    Stable control, followed by pulmonary.      Dizziness   Relevant Orders   Vitamin B12   Empty sella syndrome (HCC)    Noted incidentally on MRI brain.  Normal vitamin D, PTH and calcium levels.      Hypercholesteremia    Chronic, not at goal < 70...  On Crestor 10 mg p.o. daily      Meningioma (HCC)    Chronic, unchanged on 2023 MRI brain      Paroxysmal atrial fibrillation (HCC)    Chronic, intermittent Episodes of atrial fibrillation seem to be related to steroid use, but it appears cardiology has decided to keep her on longer-term Eliquis  Followed by  with cardiology. Continue Eliquis anticoagulation.      Primary hypertension    Stable control on diltiazem S SR 120 mg BID, spironolactone 25 mg p.o. daily      Other Visit Diagnoses     Routine general medical examination at a health care facility    -  Primary   Abnormal serum iron level       Relevant Orders   IBC + Ferritin        Kerby Nora, MD

## 2023-05-22 NOTE — Patient Instructions (Signed)
Please stop at the lab to have labs drawn.  

## 2023-05-22 NOTE — Assessment & Plan Note (Addendum)
Chronic, not at goal < 70...  On Crestor 10 mg p.o. daily

## 2023-05-22 NOTE — Assessment & Plan Note (Addendum)
Chronic, intermittent Episodes of atrial fibrillation seem to be related to steroid use, but it appears cardiology has decided to keep her on longer-term Eliquis  Followed by  with cardiology. Continue Eliquis anticoagulation.

## 2023-05-22 NOTE — Assessment & Plan Note (Signed)
Noted incidentally on MRI brain.  Normal vitamin D, PTH and calcium levels.

## 2023-05-22 NOTE — Assessment & Plan Note (Signed)
Chronic, unchanged on 2023 MRI brain

## 2023-05-23 ENCOUNTER — Encounter: Payer: Self-pay | Admitting: Family Medicine

## 2023-05-23 ENCOUNTER — Encounter (INDEPENDENT_AMBULATORY_CARE_PROVIDER_SITE_OTHER): Payer: Self-pay | Admitting: Family Medicine

## 2023-05-23 ENCOUNTER — Ambulatory Visit (INDEPENDENT_AMBULATORY_CARE_PROVIDER_SITE_OTHER): Payer: HMO | Admitting: Family Medicine

## 2023-05-23 VITALS — BP 117/74 | HR 63 | Temp 97.7°F | Ht 66.0 in | Wt 258.0 lb

## 2023-05-23 DIAGNOSIS — I10 Essential (primary) hypertension: Secondary | ICD-10-CM | POA: Diagnosis not present

## 2023-05-23 DIAGNOSIS — E669 Obesity, unspecified: Secondary | ICD-10-CM | POA: Diagnosis not present

## 2023-05-23 DIAGNOSIS — Z6841 Body Mass Index (BMI) 40.0 and over, adult: Secondary | ICD-10-CM

## 2023-05-23 DIAGNOSIS — E611 Iron deficiency: Secondary | ICD-10-CM | POA: Insufficient documentation

## 2023-05-23 NOTE — Telephone Encounter (Signed)
Patient called in regarding this message that she sent Dr Ermalene Searing.She said that she has sent a message a few days ago as well,and haven't got a response back.She would like to know what should she do regarding her iron?

## 2023-05-23 NOTE — Progress Notes (Signed)
SUBJECTIVE:  Chief Complaint: Obesity  Interim History: Patient has had a great experience at the clinic thus far.  She struggled the last few weeks and hasn't been as compliant as she was previously. She is very upset about her recent Iron and Anemia tests.  She has a good idea of how to accomplish control consistently over the next few weeks. She recognizes she may have over eaten sweet potatoes.  Kimberly Patton is here to discuss her progress with her obesity treatment plan. She is on the keeping a food journal and adhering to recommended goals of 1200 calories and 90-100 grams of protein and states she is following her eating plan approximately 40 % of the time. She states she is walking 5,000-6,000 steps.  OBJECTIVE: Visit Diagnoses: Problem List Items Addressed This Visit       Cardiovascular and Mediastinum   HTN (hypertension) - Primary   Patient BP well managed today.  She has concurrent atrial fibrillation.  She is on verapamil, spironolactone and cardizem.  Heart rate is well controlled.  Continue current medications and regular follow up with cardiology.       Vitals Temp: 97.7 F (36.5 C) BP: 117/74 Pulse Rate: 63 SpO2: 99 %   Anthropometric Measurements Height: 5\' 6"  (1.676 m) Weight: 258 lb (117 kg) BMI (Calculated): 41.66 Weight at Last Visit: 255 lb Weight Lost Since Last Visit: 0 Weight Gained Since Last Visit: 3 Starting Weight: 275 lb Total Weight Loss (lbs): 17 lb (7.711 kg)   Body Composition  Body Fat %: 53.9 % Fat Mass (lbs): 139.2 lbs Muscle Mass (lbs): 113.2 lbs Total Body Water (lbs): 89.6 lbs Visceral Fat Rating : 18   Other Clinical Data Fasting: no Labs: no Today's Visit #: 7 Starting Date: 01/08/23     ASSESSMENT AND PLAN:  Diet: [name] is currently in the action stage of change. As such, her goal is to continue with weight loss efforts. She has agreed to keeping a food journal and adhering to recommended goals of 1200 calories  and 90 protein. Patient to start food log or journaling meal plan.  The initial goal will be to habitually log or journal for at least 4 days a week.  The expectation it that patient may not initially meet calorie or protein goals as the nturitional understanding of food intake is begun.  We discussed the 10:1 ratio when reading a food label.  Patient agrees to keep a food log either electronically or on paper and bring to the next appointment to be able to dissect and discuss it with provider.    Exercise: [name] has been instructed that some exercise is better than none for weight loss and overall health benefits.   Behavior Modification:  We discussed the following Behavioral Modification Strategies today: increasing lean protein intake, increasing vegetables, meal planning and cooking strategies, holiday eating strategies, planning for success, and keep a strict food journal.   Patient already has an appointment scheduled with Dr. Rikki Spearing in early January.  She was given instructions to follow up with any questions or concerns on MyChart particularly given how upset she was about her labs today. She was informed of the importance of frequent follow up visits to maximize her success with intensive lifestyle modifications for her multiple health conditions.  Attestation Statements:   Reviewed by clinician on day of visit: allergies, medications, problem list, medical history, surgical history, family history, social history, and previous encounter notes. Total time spent on pre visit chart review,  face to face patient interaction and post visit charting was 45 minutes Reuben Likes, MD

## 2023-05-23 NOTE — Assessment & Plan Note (Signed)
Iron low on labs x 2.  Transferrin elevated, ferritin low, H/H normal, MCV lower end of normal range.  RDW elevated.  No hemoptysis, hematochezia or melena.  Patient distraught over lab tests.  Discussed that s/p gastric sleeve she is prone to iron deficiency and that she should await further recommendations from PCP.  Patient was informed if she hasn't heard from PCP by next week she can send a MyChart and I will refer to hematology for evaluation.

## 2023-05-23 NOTE — Assessment & Plan Note (Signed)
Patient BP well managed today.  She has concurrent atrial fibrillation.  She is on verapamil, spironolactone and cardizem.  Heart rate is well controlled.  Continue current medications and regular follow up with cardiology.

## 2023-05-31 ENCOUNTER — Encounter: Payer: Self-pay | Admitting: Oncology

## 2023-05-31 ENCOUNTER — Inpatient Hospital Stay: Payer: HMO | Attending: Oncology

## 2023-05-31 ENCOUNTER — Inpatient Hospital Stay (HOSPITAL_BASED_OUTPATIENT_CLINIC_OR_DEPARTMENT_OTHER): Payer: HMO | Admitting: Oncology

## 2023-05-31 VITALS — BP 136/84 | HR 88 | Temp 97.5°F | Resp 18 | Ht 66.0 in | Wt 262.0 lb

## 2023-05-31 DIAGNOSIS — Z8639 Personal history of other endocrine, nutritional and metabolic disease: Secondary | ICD-10-CM

## 2023-05-31 DIAGNOSIS — D509 Iron deficiency anemia, unspecified: Secondary | ICD-10-CM

## 2023-05-31 NOTE — Progress Notes (Unsigned)
Hematology/Oncology Consult note [facility] [phone] [phone]  Patient Care Team: Excell Seltzer, MD as PCP - General (Family Medicine) Debbe Odea, MD as PCP - Cardiology (Cardiology) Lanier Prude, MD as PCP - Electrophysiology (Cardiology) Heniford, Rodolph Bong, MD as Referring Physician (Surgery) Salena Saner, MD as Consulting Physician (Pulmonary Disease) Darnell Level, MD as Consulting Physician (General Surgery) Galen Manila, MD as Referring Physician (Ophthalmology) Creig Hines, MD as Consulting Physician (Oncology)   Name of the patient: Kimberly Patton  [id number]  15-May-1956    Reason for referral-iron deficiency anemia   Referring physician-Dr. Kerby Nora Date of visit: 05/31/23   History of presenting illness- patient is a 67 year old female with a past medical history significant for hypertension, asthma, osteoporosis, laparoscopic sleeve streptokinase surgery in December 2014 referred for iron deficiency anemiaCBC from 05/15/2023 showed white cell count of 6.7, H&H of 12.6/38.9 with a platelet count of 315.  B12 levels were normal at 1057.  Iron studies showed a low ferritin of 5 with an iron saturation of 7% and elevated TIBC which has been a chronic issue for her and her ferritin levels have been low at least dating back to July 2021.She had an upper endoscopy by Dr. Mia Creek in May 2024 which showed 3 cm hiatal hernia.  LA grade a reflux esophagitis without bleeding and sleeve gastrectomy with healthy-appearing mucosa.  Last colonoscopy was in November 2022 by Dr. Tobi Bastos which showed 2 polyps in the transverse colon and 1 in the ascending colon which were resected and were negative for malignancy.  She also has a prior history of complex sclerosing lesion status post right lumpectomy in January 2023 which showed fibrocystic changes with epithelial hyperplasia without atypia and negative for carcinoma  Patient  reports feeling occasional lightheadedness as well as feeling cold easily.  Denies any blood loss in her stool or urine.  Denies any dark melanotic stools.  Denies any consistent use of NSAIDs  ECOG PS- 1  Pain scale- 0   Review of systems- Review of Systems  Constitutional:  Negative for chills, fever, malaise/fatigue and weight loss.  HENT:  Negative for congestion, ear discharge and nosebleeds.   Eyes:  Negative for blurred vision.  Respiratory:  Negative for cough, hemoptysis, sputum production, shortness of breath and wheezing.   Cardiovascular:  Negative for chest pain, palpitations, orthopnea and claudication.  Gastrointestinal:  Negative for abdominal pain, blood in stool, constipation, diarrhea, heartburn, melena, nausea and vomiting.  Genitourinary:  Negative for dysuria, flank pain, frequency, hematuria and urgency.  Musculoskeletal:  Negative for back pain, joint pain and myalgias.  Skin:  Negative for rash.  Neurological:  Negative for dizziness, tingling, focal weakness, seizures, weakness and headaches.  Endo/Heme/Allergies:  Does not bruise/bleed easily.  Psychiatric/Behavioral:  Negative for depression and suicidal ideas. The patient does not have insomnia.     Allergies  Allergen Reactions   Wasp Venom Anaphylaxis   Chlorhexidine Gluconate     Itching under breasts   Metronidazole Other (See Comments)    Chest tightness, neck tightness   Prednisone Other (See Comments)    Severe migraine / with taper dose Told by Cardiologist to avoid due to Afib   Other Rash    Bleach   Sodium Hypochlorite Rash    Liquid clorox bleach    Patient Active Problem List   Diagnosis Date Noted   Iron deficiency 05/23/2023   Dizziness 01/29/2023   Prediabetes 01/08/2023   Abnormal metabolism 01/08/2023  Paroxysmal atrial fibrillation with RVR (HCC) 11/09/2022   HTN (hypertension) 11/09/2022   Asthma 11/09/2022   Chest pain with moderate risk for cardiac etiology 11/09/2022    History of lobectomy of thyroid 09/20/2022   Left thyroid nodule 08/21/2022   Acute cystitis 08/17/2022   COVID-19 virus infection 04/30/2022   Paroxysmal atrial fibrillation (HCC) 03/28/2022   Empty sella syndrome (HCC) 03/28/2022   Varicose veins with inflammation 01/07/2022   S/P breast lumpectomy 10/10/2021   Cervical radiculopathy 06/28/2020   Chronic venous insufficiency 05/29/2020   Lymphedema 05/29/2020   CAD (coronary artery disease) 05/29/2020   Cervical lymphadenitis 04/18/2020   History of repair of hiatal hernia 02/02/2020   Hypercholesteremia 06/09/2019   Chronic neuropathic pain 07/31/2018   Thyroid nodule 04/15/2018   Skin lesion of scalp 09/06/2017   Age-related osteoporosis without current pathological fracture 12/04/2016   Osteoarthritis of knee 07/17/2016   Meningioma (HCC) 01/18/2016   Family history of aortic stenosis 01/20/2015   Status post laparoscopic sleeve gastrectomy Dec 2014 06/08/2013   Class 3 severe obesity with serious comorbidity and body mass index (BMI) of 40.0 to 44.9 in adult [facility]) 12/08/2012   Primary hypertension 10/11/2011   Varicose veins of bilateral lower extremities with other complications 06/18/2011   FIBROIDS, UTERUS 04/03/2007   Asthma, mild intermittent 04/03/2007     Past Medical History:  Diagnosis Date   ALLERGIC RHINITIS 04/03/2007   Allergy    Not sure   Anemia    ((Pt Qnr Sub: Denies at visit from 08/28/2021 with endocrinology))    Anginal pain (HCC)    Anxiety    Arthritis Not sure   Asthma    hx of years ago - no longer a problem    ASTHMA, PERSISTENT, MODERATE 04/03/2007   Back pain    Chest pain    Depression screen 01/08/2023   Dyspnea    with exertion   Dyspnea on exertion    a. 11/2007 Echo: EF 60%.   Dysrhythmia    hx of heart arrhythmia 10-15 years ago - followed by DR Ladona Ridgel - not seen in years    Empty sella syndrome (HCC)    GERD (gastroesophageal reflux disease)    Headache    Hemophilia  carrier    Hiatal hernia    History of kidney stones    Hypercholesteremia 06/09/2019   Hypertension    Joint pain    HIPS / LEGS   Joint pain    Knee injury    RT   Lymph edema    bilateral legs   Meningioma (HCC)    Morbid obesity (HCC)    Neuromuscular disorder (HCC) Not sure, since my fall   Occipital neuralgia    Osteoarthritis    Palpitations    Paroxysmal SVT (supraventricular tachycardia) (HCC)    a. 11/2011 48h Holter: RSR, rare PVC's, occas PAC's   Retinal tear of right eye 01/2016   Skin cancer    SOB (shortness of breath)    Thyroid nodule 04/15/2018   Ventral hernia      Past Surgical History:  Procedure Laterality Date   ABDOMINAL HYSTERECTOMY  Think 1994   APPENDECTOMY  1996   ARTERY BIOPSY Left 03/21/2022   Procedure: BIOPSY TEMPORAL ARTERY;  Surgeon: Renford Dills, MD;  Location: [facility] ORS;  Service: Vascular;  Laterality: Left;   BIOPSY THYROID     08/2021   BREAST CYST EXCISION Right 1994   Benign   CARDIOVERSION N/A 11/12/2022   Procedure:  CARDIOVERSION;  Surgeon: Antonieta Iba, MD;  Location: [facility] OR[facility];  Service: Cardiovascular;  Laterality: N/A;   CESAREAN SECTION     x2   CHOLECYSTECTOMY  1995   COLONOSCOPY WITH PROPOFOL N/A 05/09/2021   Procedure: COLONOSCOPY WITH PROPOFOL;  Surgeon: Wyline Mood, MD;  Location: [facility] ENDOSCOPY;  Service: Gastroenterology;  Laterality: N/A;   ESOPHAGOGASTRODUODENOSCOPY (EGD) WITH PROPOFOL N/A 11/02/2022   Procedure: ESOPHAGOGASTRODUODENOSCOPY (EGD) WITH PROPOFOL;  Surgeon: Regis Bill, MD;  Location: [facility] ENDOSCOPY;  Service: Endoscopy;  Laterality: N/A;   EYE SURGERY  01/2016   Repair retinal tear   FOOT SURGERY  1995 / 1996   x2   HERNIA REPAIR     KNEE ARTHROSCOPY W/ MENISCAL REPAIR  07/25/2013   right knee Dr. Dion Saucier   LAPAROSCOPIC GASTRIC SLEEVE RESECTION N/A 06/08/2013   Procedure: LAPAROSCOPIC GASTRIC SLEEVE RESECTION AND EXCISION OF SEBACEUS CYST FROM MID CHEST takedown of incarcerated  ventral hernia and primary repair endoscopy;  Surgeon: Valarie Merino, MD;  Location: [facility];  Service: General;  Laterality: N/A;   LAPAROSCOPIC NISSEN FUNDOPLICATION N/A 02/02/2020   Procedure: LAPAROSCOPIC REPAIR LARGE SYMPTOMATIC HIATAL HERNIA WITH UPPER ENDOSCOPY;  Surgeon: Luretha Murphy, MD;  Location: [facility];  Service: General;  Laterality: N/A;   moles  06/2013   removed 2 moles from under arm and  lowback   NECK SURGERY     occipital nerve damage- injections    OVARIAN CYST REMOVAL  1970   PILONIDAL CYST EXCISION  1975   RADIOACTIVE SEED GUIDED EXCISIONAL BREAST BIOPSY Right 06/23/2021   Procedure: RADIOACTIVE SEED GUIDED EXCISIONAL RIGHT BREAST BIOPSY;  Surgeon: Luretha Murphy, MD;  Location: [facility];  Service: General;  Laterality: Right;   RETINAL TEAR REPAIR CRYOTHERAPY Right 01/2016   [place]   SKIN CANCER EXCISION     TEE WITHOUT CARDIOVERSION N/A 11/12/2022   Procedure: TRANSESOPHAGEAL ECHOCARDIOGRAM;  Surgeon: Antonieta Iba, MD;  Location: [facility] OR[facility];  Service: Cardiovascular;  Laterality: N/A;   THYROID LOBECTOMY Left 08/21/2022   Procedure: LEFT THYROID LOBECTOMY;  Surgeon: Darnell Level, MD;  Location: [facility];  Service: General;  Laterality: Left;   THYROIDECTOMY, PARTIAL     TONSILLECTOMY     TUBAL LIGATION  Think 1994   UPPER GI ENDOSCOPY  06/08/2013   Procedure: UPPER GI ENDOSCOPY;  Surgeon: Valarie Merino, MD;  Location: [facility];  Service: General;;   US ECHOCARDIOGRAPHY  12/2011   WNL - EF 55-60%, mild MR, grade 1 diastolic dysnfiction (mild)   VENTRAL HERNIA REPAIR  2000    Social History   Socioeconomic History   Marital status: Divorced    Spouse name: Not on file   Number of children: 2   Years of education: 17   Highest education level: Bachelor's degree (e.g., BA, AB, BS)  Occupational History   Occupation: Magazine features editor: [employer]   Occupation: Retired Runner, broadcasting/film/video  Tobacco Use   Smoking status: Never    Smokeless tobacco: Never   Tobacco comments:    smoke at age [age]  Vaping Use   Vaping status: Never Used  Substance and Sexual Activity   Alcohol use: Not Currently   Drug use: No   Sexual activity: Not Currently    Partners: Male    Birth control/protection: Abstinence, Pill  Other Topics Concern   Not on file  Social History Narrative   Lives at home alone.   Right-handed.   Occasional caffeine use.  Social Drivers of Corporate investment banker Strain: Low Risk  (05/18/2023)   Overall Financial Resource Strain (CARDIA)    Difficulty of Paying Living Expenses: Not hard at all  Food Insecurity: No Food Insecurity (05/31/2023)   Hunger Vital Sign    Worried About Running Out of Food in the Last Year: Never true    Ran Out of Food in the Last Year: Never true  Transportation Needs: No Transportation Needs (05/18/2023)   PRAPARE - Administrator, Civil Service (Medical): No    Lack of Transportation (Non-Medical): No  Physical Activity: Inactive (05/18/2023)   Exercise Vital Sign    Days of Exercise per Week: 0 days    Minutes of Exercise per Session: 10 min  Stress: No Stress Concern Present (05/18/2023)   [employer] of Occupational Health - Occupational Stress Questionnaire    Feeling of Stress : Only a little  Social Connections: Moderately Integrated (05/18/2023)   Social Connection and Isolation Panel [NHANES]    Frequency of Communication with Friends and Family: More than three times a week    Frequency of Social Gatherings with Friends and Family: Twice a week    Attends Religious Services: More than 4 times per year    Active Member of [employer] or Organizations: Yes    Attends Engineer, structural: More than 4 times per year    Marital Status: Divorced  Intimate Partner Violence: Not At Risk (05/31/2023)   Humiliation, Afraid, Rape, and Kick questionnaire    Fear of Current or Ex-Partner: No    Emotionally Abused: No    Physically Abused:  No    Sexually Abused: No     Family History  Problem Relation Age of Onset   Breast cancer Mother    Diabetes Mother    Hypertension Mother    Kidney failure Mother    Heart disease Mother    Obesity Mother    Diabetes Father    Hypertension Father    Heart disease Father    Diabetes Other    Hypertension Other    Stroke Other    Hemophilia Other      Current Outpatient Medications:    apixaban (ELIQUIS) 5 MG TABS tablet, Take 1 tablet (5 mg total) by mouth 2 (two) times daily., Disp: 60 tablet, Rfl: 11   Calcium Citrate-Vitamin D (CITRACAL + D PO), Take 2 tablets by mouth daily. , Disp: , Rfl:    Cholecalciferol (VITAMIN D3) 1.25 MG (50000 UT) CAPS, TAKE 1 CAPSULE (50,000 UNITS TOTAL) BY MOUTH ONCE A WEEK. TUESDAY, Disp: 12 capsule, Rfl: 0   Coenzyme Q10-Vitamin E (QUNOL ULTRA COQ10) 100-150 MG-UNIT CAPS, Take 100 mg by mouth daily., Disp: , Rfl:    Cyanocobalamin (VITAMIN B-12) 2500 MCG SUBL, Place 2,500 mcg under the tongue daily. (Patient not taking: Reported on 05/31/2023), Disp: , Rfl:    diltiazem (CARDIZEM) 30 MG tablet, Take 30 mg by mouth 2 (two) times daily as needed., Disp: , Rfl:    esomeprazole (NEXIUM) 40 MG capsule, Take 40 mg by mouth daily., Disp: , Rfl:    gabapentin (NEURONTIN) 100 MG capsule, Take 100 mg by mouth daily., Disp: , Rfl:    Lactobacillus Rhamnosus, GG, (CULTURELLE IMMUNITY SUPPORT PO), Take 1 capsule by mouth daily., Disp: , Rfl:    levalbuterol (XOPENEX HFA) 45 MCG/ACT inhaler, Inhale 2 puffs into the lungs every 6 (six) hours as needed for wheezing., Disp: 1 each, Rfl: 2   Menaquinone-7 (  VITAMIN K2 PO), Take 100 mg by mouth daily., Disp: , Rfl:    metFORMIN (GLUCOPHAGE-XR) 500 MG 24 hr tablet, Take 1 tablet (500 mg total) by mouth 2 (two) times daily with a meal., Disp: 180 tablet, Rfl: 0   methocarbamol (ROBAXIN) 500 MG tablet, Take 500 mg by mouth at bedtime as needed for muscle spasms., Disp: , Rfl:    montelukast (SINGULAIR) 10 MG tablet,  Take 1 tablet (10 mg total) by mouth daily., Disp: 90 tablet, Rfl: 3   Multiple Vitamins-Minerals (CENTRUM SILVER 50+WOMEN PO), Take 1 tablet by mouth daily., Disp: , Rfl:    nitroGLYCERIN (NITROSTAT) 0.4 MG SL tablet, Place 1 tablet (0.4 mg total) under the tongue every 5 (five) minutes as needed for chest pain., Disp: 25 tablet, Rfl: 3   Riboflavin 400 MG CAPS, Take 400 mg by mouth daily., Disp: , Rfl:    rosuvastatin (CRESTOR) 10 MG tablet, Take 1 tablet (10 mg total) by mouth daily., Disp: 90 tablet, Rfl: 3   spironolactone (ALDACTONE) 25 MG tablet, Take 1 tablet (25 mg total) by mouth daily., Disp: 90 tablet, Rfl: 3   traMADol (ULTRAM) 50 MG tablet, Take 1 tablet (50 mg total) by mouth every 6 (six) hours as needed., Disp: 12 tablet, Rfl: 0   TURMERIC PO, Take 2 tablets by mouth daily. 500 mg  With Ginger 50 mg Gummies, Disp: , Rfl:    verapamil (CALAN-SR) 120 MG CR tablet, Take 1 tablet (120 mg total) by mouth 2 (two) times daily., Disp: 180 tablet, Rfl: 3   Zoledronic Acid (RECLAST IV), Inject into the vein. Yearly injection, Disp: , Rfl:  No current facility-administered medications for this visit.  Facility-Administered Medications Ordered in Other Visits:    albuterol (PROVENTIL) (2.5 MG/3ML) 0.083% nebulizer solution 2.5 mg, 2.5 mg, Nebulization, Once, Salena Saner, MD   Physical exam: There were no vitals filed for this visit. Physical Exam Cardiovascular:     Rate and Rhythm: Normal rate and regular rhythm.     Heart sounds: Normal heart sounds.  Pulmonary:     Effort: Pulmonary effort is normal.     Breath sounds: Normal breath sounds.  Abdominal:     General: Bowel sounds are normal.     Palpations: Abdomen is soft.  Skin:    General: Skin is warm and dry.  Neurological:     Mental Status: She is alert and oriented to person, place, and time.           Latest Ref Rng & Units 05/15/2023    7:21 AM  CMP  Glucose 70 - 99 mg/dL 83   BUN 6 - 23 mg/dL 20    Creatinine 2.13 - 1.20 mg/dL 0.86   Sodium 578 - 469 mEq/L 141   Potassium 3.5 - 5.1 mEq/L 4.2   Chloride 96 - 112 mEq/L 102   CO2 19 - 32 mEq/L 31   Calcium 8.4 - 10.5 mg/dL 9.7   Total Protein 6.0 - 8.3 g/dL 7.2   Total Bilirubin 0.2 - 1.2 mg/dL 0.5   Alkaline Phos 39 - 117 U/L 81   AST 0 - 37 U/L 16   ALT 0 - 35 U/L 14       Latest Ref Rng & Units 05/15/2023    7:21 AM  CBC  WBC 4.0 - 10.5 K/uL 6.7   Hemoglobin 12.0 - 15.0 g/dL 62.9   Hematocrit 52.8 - 46.0 % 38.9   Platelets 150.0 - 400.0 K/uL  315.0     No images are attached to the encounter.  No results found.  Assessment and plan- Patient is a 67 y.o. female ***   Thank you for this kind referral and the opportunity to participate in the care of this  Patient   Visit Diagnosis 1. History of iron deficiency     Dr. Owens Shark, MD, MPH [facility] [phone] 05/31/2023

## 2023-06-02 ENCOUNTER — Encounter: Payer: Self-pay | Admitting: Oncology

## 2023-06-06 ENCOUNTER — Telehealth: Payer: Self-pay | Admitting: Oncology

## 2023-06-06 NOTE — Telephone Encounter (Signed)
Patient called to see why she hasn't been called to schedule iron. She states she was told she she would be called Monday 12/23, after seeing MD on 12/20 and hasn't received a call yet. Please advise how to schedule her so I can call her back.

## 2023-06-10 ENCOUNTER — Inpatient Hospital Stay: Payer: HMO

## 2023-06-10 ENCOUNTER — Encounter: Payer: Self-pay | Admitting: Oncology

## 2023-06-10 VITALS — BP 133/72 | HR 86 | Temp 96.0°F | Resp 18

## 2023-06-10 DIAGNOSIS — D509 Iron deficiency anemia, unspecified: Secondary | ICD-10-CM | POA: Diagnosis not present

## 2023-06-10 DIAGNOSIS — E611 Iron deficiency: Secondary | ICD-10-CM

## 2023-06-10 MED ORDER — SODIUM CHLORIDE 0.9 % IV SOLN
510.0000 mg | INTRAVENOUS | Status: DC
Start: 2023-06-10 — End: 2023-06-10
  Administered 2023-06-10: 510 mg via INTRAVENOUS
  Filled 2023-06-10: qty 510

## 2023-06-17 ENCOUNTER — Ambulatory Visit: Payer: BC Managed Care – PPO | Admitting: Internal Medicine

## 2023-06-17 ENCOUNTER — Inpatient Hospital Stay: Payer: HMO | Attending: Oncology

## 2023-06-17 ENCOUNTER — Encounter: Payer: Self-pay | Admitting: Family Medicine

## 2023-06-17 VITALS — BP 108/66 | HR 63 | Temp 97.2°F | Resp 16

## 2023-06-17 DIAGNOSIS — E611 Iron deficiency: Secondary | ICD-10-CM

## 2023-06-17 DIAGNOSIS — D509 Iron deficiency anemia, unspecified: Secondary | ICD-10-CM | POA: Diagnosis not present

## 2023-06-17 DIAGNOSIS — M810 Age-related osteoporosis without current pathological fracture: Secondary | ICD-10-CM

## 2023-06-17 MED ORDER — SODIUM CHLORIDE 0.9 % IV SOLN
INTRAVENOUS | Status: DC
  Filled 2023-06-17: qty 250

## 2023-06-17 MED ORDER — SODIUM CHLORIDE 0.9 % IV SOLN
510.0000 mg | INTRAVENOUS | Status: DC
  Administered 2023-06-17: 510 mg via INTRAVENOUS
  Filled 2023-06-17: qty 510

## 2023-06-17 NOTE — Patient Instructions (Signed)
 Ferumoxytol Injection What is this medication? FERUMOXYTOL (FER ue MOX i tol) treats low levels of iron in your body (iron deficiency anemia). Iron is a mineral that plays an important role in making red blood cells, which carry oxygen from your lungs to the rest of your body. This medicine may be used for other purposes; ask your health care provider or pharmacist if you have questions. COMMON BRAND NAME(S): Feraheme What should I tell my care team before I take this medication? They need to know if you have any of these conditions: Anemia not caused by low iron levels High levels of iron in the blood Magnetic resonance imaging (MRI) test scheduled An unusual or allergic reaction to iron, other medications, foods, dyes, or preservatives Pregnant or trying to get pregnant Breastfeeding How should I use this medication? This medication is injected into a vein. It is given by your care team in a hospital or clinic setting. Talk to your care team the use of this medication in children. Special care may be needed. Overdosage: If you think you have taken too much of this medicine contact a poison control center or emergency room at once. NOTE: This medicine is only for you. Do not share this medicine with others. What if I miss a dose? It is important not to miss your dose. Call your care team if you are unable to keep an appointment. What may interact with this medication? Other iron products This list may not describe all possible interactions. Give your health care provider a list of all the medicines, herbs, non-prescription drugs, or dietary supplements you use. Also tell them if you smoke, drink alcohol, or use illegal drugs. Some items may interact with your medicine. What should I watch for while using this medication? Visit your care team regularly. Tell your care team if your symptoms do not start to get better or if they get worse. You may need blood work done while you are taking this  medication. You may need to follow a special diet. Talk to your care team. Foods that contain iron include: whole grains/cereals, dried fruits, beans, or peas, leafy green vegetables, and organ meats (liver, kidney). What side effects may I notice from receiving this medication? Side effects that you should report to your care team as soon as possible: Allergic reactions--skin rash, itching, hives, swelling of the face, lips, tongue, or throat Low blood pressure--dizziness, feeling faint or lightheaded, blurry vision Shortness of breath Side effects that usually do not require medical attention (report to your care team if they continue or are bothersome): Flushing Headache Joint pain Muscle pain Nausea Pain, redness, or irritation at injection site This list may not describe all possible side effects. Call your doctor for medical advice about side effects. You may report side effects to FDA at 1-800-FDA-1088. Where should I keep my medication? This medication is given in a hospital or clinic. It will not be stored at home. NOTE: This sheet is a summary. It may not cover all possible information. If you have questions about this medicine, talk to your doctor, pharmacist, or health care provider.  2024 Elsevier/Gold Standard (2022-11-02 00:00:00)

## 2023-06-17 NOTE — Progress Notes (Signed)
 Declined 30 minute post-observation. Educated on risks. Verbalized understanding. Vitals stable.

## 2023-06-18 ENCOUNTER — Ambulatory Visit (INDEPENDENT_AMBULATORY_CARE_PROVIDER_SITE_OTHER): Payer: HMO | Admitting: Internal Medicine

## 2023-06-19 ENCOUNTER — Other Ambulatory Visit: Payer: Self-pay

## 2023-06-19 MED ORDER — ROSUVASTATIN CALCIUM 10 MG PO TABS: 10.0000 mg | ORAL_TABLET | Freq: Every day | ORAL | 3 refills | Status: DC

## 2023-06-22 ENCOUNTER — Other Ambulatory Visit: Payer: Self-pay | Admitting: Family Medicine

## 2023-06-23 NOTE — Telephone Encounter (Signed)
 Last office visit 05/22/23 for CPE.  Last refilled 04/03/2023 for #12 with no refills.  Vit D level 03/14/2022 normal at 54.40 ng/mL.  Next Appt: No future appointments with PCP.

## 2023-06-24 ENCOUNTER — Other Ambulatory Visit: Payer: Self-pay | Admitting: Family Medicine

## 2023-06-25 MED ORDER — VITAMIN D3 1.25 MG (50000 UT) PO CAPS: 50000.0000 [IU] | ORAL_CAPSULE | ORAL | 0 refills | Status: DC

## 2023-06-26 ENCOUNTER — Ambulatory Visit (INDEPENDENT_AMBULATORY_CARE_PROVIDER_SITE_OTHER): Payer: HMO | Admitting: Internal Medicine

## 2023-06-26 ENCOUNTER — Encounter (INDEPENDENT_AMBULATORY_CARE_PROVIDER_SITE_OTHER): Payer: Self-pay | Admitting: Internal Medicine

## 2023-06-26 VITALS — BP 119/74 | HR 66 | Temp 97.8°F | Ht 66.0 in | Wt 258.0 lb

## 2023-06-26 DIAGNOSIS — E66813 Obesity, class 3: Secondary | ICD-10-CM | POA: Diagnosis not present

## 2023-06-26 DIAGNOSIS — Z9884 Bariatric surgery status: Secondary | ICD-10-CM | POA: Diagnosis not present

## 2023-06-26 DIAGNOSIS — R948 Abnormal results of function studies of other organs and systems: Secondary | ICD-10-CM

## 2023-06-26 DIAGNOSIS — Z6841 Body Mass Index (BMI) 40.0 and over, adult: Secondary | ICD-10-CM

## 2023-06-26 DIAGNOSIS — R638 Other symptoms and signs concerning food and fluid intake: Secondary | ICD-10-CM | POA: Diagnosis not present

## 2023-06-26 DIAGNOSIS — R7303 Prediabetes: Secondary | ICD-10-CM

## 2023-06-26 DIAGNOSIS — I10 Essential (primary) hypertension: Secondary | ICD-10-CM

## 2023-06-26 MED ORDER — ZEPBOUND 2.5 MG/0.5ML ~~LOC~~ SOAJ: 2.5000 mg | SUBCUTANEOUS | 0 refills | Status: DC

## 2023-06-26 NOTE — Assessment & Plan Note (Signed)
 She is on verapamil  and spironolactone .  Blood pressure is well-controlled.  No side effects reported.  She will continue current regimen

## 2023-06-26 NOTE — Assessment & Plan Note (Signed)
 She has lost 33 pounds since 2024.  Reports a weight gain of three pounds over the holidays and frustration with the rate of weight loss post-gastric sleeve surgery. Slow metabolism potentially due to loss of muscle mass and low physical activity. Currently consuming 1200 calories and 100 grams of protein daily but inconsistent over the holidays. Discussed GLP-1 agonists for appetite suppression and weight management, including benefits, risks (gastrointestinal side effects, potential worsening of GERD), and affordability concerns. Alternative medications include increasing metformin  or adding topiramate.  Discussed the importance of regular physical activity and exploring low-impact options such as aquatic programs to increase metabolism and support weight loss. Emphasized consistent meal patterns and adequate protein intake to support metabolic rate. - Encourage participation in aquatic programs - Maintain three meals a day with 100 grams of protein - Continue 1200 calorie diet

## 2023-06-26 NOTE — Assessment & Plan Note (Signed)
 Patient has a slower than predicted metabolism. IC 1627 vs. calculated 2364.  This may contribute to weight gain, chronic fatigue and difficulty losing weight.  This is likely due to decrease muscle mass, metabolic adaptations following gastric bypass, multiple comorbid conditions.  She screened negative for OSA today.

## 2023-06-26 NOTE — Progress Notes (Signed)
 Office: [phone]  /  Fax: [phone]  Weight Summary And Biometrics  Vitals Temp: 97.8 F (36.6 C) BP: 119/74 Pulse Rate: 66 SpO2: 98 %   Anthropometric Measurements Height: 5\' 6"  (1.676 m) Weight: 258 lb (117 kg) BMI (Calculated): 41.66 Weight at Last Visit: 258 lb Weight Lost Since Last Visit: 0 lb Weight Gained Since Last Visit: 0 lb Starting Weight: 275 lb Total Weight Loss (lbs): 17 lb (7.711 kg)   Body Composition  Body Fat %: 52.7 % Fat Mass (lbs): 136 lbs Muscle Mass (lbs): 115.8 lbs Total Body Water (lbs): 86.6 lbs Visceral Fat Rating : 18    No data recorded Today's Visit #: 8  Starting Date: 01/08/23   Subjective   Chief Complaint: Obesity  Kimberly Patton is here to discuss her progress with her obesity treatment plan. She is on the the Category 2 Plan and states she is following her eating plan approximately 40 % of the time. She states she is not exercising.  Interval History:   Discussed the use of AI scribe software for clinical note transcription with the patient, who gave verbal consent to proceed.  History of Present Illness   The patient, with a history of gastric sleeve surgery, presents for medical weight management. She reports a recent weight gain of three pounds, which has remained stable. She expresses dissatisfaction with her rate of weight loss, comparing it unfavorably to the rapid weight loss experienced post-gastric sleeve surgery and the weight loss of her peers using a new supplement.  The patient also reports feeling bloated, which she attributes to recent iron infusions. These infusions have been associated with severe headaches and nausea, leading to the cancellation of an appointment.  The patient acknowledges not adhering to her prescribed 1200 calorie diet over the holiday period, although she has maintained her protein intake. She expresses frustration with her perceived slow rate of weight loss and a lack of  motivation.  The patient's medical history includes GERD, atrial fibrillation, and orthopedic problems, which limit her physical activity. She has not been tested for sleep apnea but denies symptoms such as snoring or waking up gasping for air. She reports waking up around four o'clock every morning, a long-standing habit, and feeling rested upon waking.  The patient is currently on metformin , with a refill due in March. She expresses willingness to try a new medication, Zepbound  if covered by her insurance.    Orexigenic Control:  Reports problems with appetite and hunger signals.  Reports problems with satiety and satiation.  Denies problems with eating patterns and portion control.  Denies abnormal cravings. Denies feeling deprived or restricted.   Barriers identified: strong hunger signals and impaired satiety / inhibitory control, low volume of physical activity at present , orthopedic problems, medical conditions or chronic pain affecting mobility, and medical comorbidities.  Metabolic adaptations associated with gastric sleeve  Pharmacotherapy for weight loss: She is currently taking Metformin  (off label use for incretin effect and / or insulin  resistance and / or diabetes prevention) with adequate clinical response  and without side effects..   Assessment and Plan   Treatment Plan For Obesity:  Recommended Dietary Goals  Kimberly Patton is currently in the action stage of change. As such, her goal is to continue weight management plan. She has agreed to: keep a food journal with a target of  1200 calories and 30-40 grams of protein per meal and continue current plan  Behavioral Intervention  We discussed the following Behavioral Modification Strategies today:  continue to work on maintaining a reduced calorie state, getting the recommended amount of protein, incorporating whole foods, making healthy choices, staying well hydrated and practicing mindfulness when eating..  Additional  resources provided today: None  Recommended Physical Activity Goals  Kimberly Patton has been advised to work up to 150 minutes of moderate intensity aerobic activity a week and strengthening exercises 2-3 times per week for cardiovascular health, weight loss maintenance and preservation of muscle mass.   She has agreed to :  Think about enjoyable ways to increase daily physical activity and overcoming barriers to exercise and Increase physical activity in their day and reduce sedentary time (increase NEAT).  Pharmacotherapy  We discussed various medication options to help Kimberly Patton with her weight loss efforts and we both agreed to : start anti-obesity medication.  In addition to reduced calorie nutrition plan (RCNP), behavioral strategies and physical activity, Kimberly Patton would benefit from pharmacotherapy to assist with hunger signals, satiety and cravings. This will reduce obesity-related health risks by inducing weight loss, and help reduce food consumption and adherence to [facility]) . It may also improve QOL by improving self-confidence and reduce the  setbacks associated with metabolic adaptations.  She has a history of gastric sleeve surgery with maladaptations, prediabetes, hypertension, coronary artery disease, atrial fibrillation and venous insufficiency.  These are conditions that are affected by her weight and therefore she said risk for future complications.  After discussion of treatment options, mechanisms of action, benefits, side effects, contraindications and shared decision making she is agreeable to starting Zepbound  2.5 mg once a week. Patient also made aware that medication is indicated for long-term management of obesity and the risk of weight regain following discontinuation of treatment and hence the importance of adhering to medical weight loss plan.  We demonstrated use of device and patient using teach back method was able to demonstrate proper technique.  Associated Conditions Addressed  and Impacted by Obesity Treatment  Class 3 severe obesity with serious comorbidity and body mass index (BMI) of 40.0 to 44.9 in adult, unspecified obesity type [facility]) Assessment & Plan: She has lost 33 pounds since 2024.  Reports a weight gain of three pounds over the holidays and frustration with the rate of weight loss post-gastric sleeve surgery. Slow metabolism potentially due to loss of muscle mass and low physical activity. Currently consuming 1200 calories and 100 grams of protein daily but inconsistent over the holidays. Discussed GLP-1 agonists for appetite suppression and weight management, including benefits, risks (gastrointestinal side effects, potential worsening of GERD), and affordability concerns. Alternative medications include increasing metformin  or adding topiramate.  Discussed the importance of regular physical activity and exploring low-impact options such as aquatic programs to increase metabolism and support weight loss. Emphasized consistent meal patterns and adequate protein intake to support metabolic rate. - Encourage participation in aquatic programs - Maintain three meals a day with 100 grams of protein - Continue 1200 calorie diet   Orders: -     Zepbound ; Inject 2.5 mg into the skin once a week.  Dispense: 2 mL; Refill: 0  Prediabetes Assessment & Plan: Patient currently on metformin  twice daily for pharmacoprophylaxis without any adverse effects.  She will continue current medication.  We discussed the use of GLP-1 for pharmacoprophylaxis and also weight management.  She will be prescribed Zepbound  2.5 mg once a week   Orders: -     Zepbound ; Inject 2.5 mg into the skin once a week.  Dispense: 2 mL; Refill: 0  Status post laparoscopic sleeve  gastrectomy Dec 2014 Assessment & Plan: Presurgical weight 340 pounds, nadir 200 and a year and a half.  Started to regain weight in 2017 after divorce.  Her weight regain is multifactorial and likely secondary to  behavioral and metabolic maladaptations following gastric bypass.  Orders: -     Zepbound ; Inject 2.5 mg into the skin once a week.  Dispense: 2 mL; Refill: 0  Abnormal metabolism Assessment & Plan: Patient has a slower than predicted metabolism. IC 1627 vs. calculated 2364.  This may contribute to weight gain, chronic fatigue and difficulty losing weight.  This is likely due to decrease muscle mass, metabolic adaptations following gastric bypass, multiple comorbid conditions.  She screened negative for OSA today.    Primary hypertension Assessment & Plan: She is on verapamil  and spironolactone .  Blood pressure is well-controlled.  No side effects reported.  She will continue current regimen   Abnormal food appetite Assessment & Plan: She has increased orexigenic signaling, impaired satiety and inhibitory control. This is secondary to an abnormal energy regulation system and pathological neurohormonal pathways characteristic of excess adiposity.  In addition to nutritional and behavioral strategies she benefits from pharmacotherapy with GLP-1.       Assessment and Plan    Obesity   Gastroesophageal Reflux Disease (GERD) Expresses concern about potential worsening of symptoms with GLP-1 agonist therapy, which can slow digestion and exacerbate acid reflux, especially with fatty, sugary, or acidic foods. - Monitor GERD symptoms closely if starting GLP-1 agonist therapy - Advise avoiding foods that exacerbate GERD symptoms  Iron Deficiency Anemia Reports bloating, severe headaches, and nausea following recent iron infusions. Had two infusions in the past two weeks and canceled the last appointment due to adverse effects. - Monitor symptoms and adjust iron infusion schedule as needed - Encourage contacting primary care physician for further evaluation  General Health Maintenance   Follow-up - Follow-up appointment in three weeks.        Objective   Physical Exam:  Blood  pressure 119/74, pulse 66, temperature 97.8 F (36.6 C), height 5\' 6"  (1.676 m), weight 258 lb (117 kg), SpO2 98%. Body mass index is 41.64 kg/m.  General: She is overweight, cooperative, alert, well developed, and in no acute distress. PSYCH: Has normal mood, affect and thought process.   HEENT: EOMI, sclerae are anicteric. Lungs: Normal breathing effort, no conversational dyspnea. Extremities: No edema.  Neurologic: No gross sensory or motor deficits. No tremors or fasciculations noted.    Diagnostic Data Reviewed:  BMET    Component Value Date/Time   NA 141 05/15/2023 0721   NA 139 01/21/2020 1137   K 4.2 05/15/2023 0721   CL 102 05/15/2023 0721   CO2 31 05/15/2023 0721   GLUCOSE 83 05/15/2023 0721   BUN 20 05/15/2023 0721   BUN 16 01/21/2020 1137   CREATININE 0.55 05/15/2023 0721   CREATININE 0.57 01/01/2020 1623   CALCIUM  9.7 05/15/2023 0721   GFRNONAA >60 11/12/2022 0322   GFRAA >60 02/02/2020 1417   Lab Results  Component Value Date   HGBA1C 5.6 05/15/2023   HGBA1C  12/03/2007    5.5 (NOTE)   The ADA recommends the following therapeutic goal for glycemic   control related to Hgb A1C measurement:   Goal of Therapy:   < 7.0% Hgb A1C   Reference: American Diabetes Association: Clinical Practice   Recommendations 2008, Diabetes Care,  2008, 31:(Suppl 1).   Lab Results  Component Value Date   INSULIN  11.9 01/08/2023  Lab Results  Component Value Date   TSH 1.69 05/15/2023   CBC    Component Value Date/Time   WBC 6.7 05/15/2023 0721   RBC 4.75 05/15/2023 0721   HGB 12.6 05/15/2023 0721   HCT 38.9 05/15/2023 0721   PLT 315.0 05/15/2023 0721   MCV 82.0 05/15/2023 0721   MCH 26.3 11/12/2022 0322   MCHC 32.3 05/15/2023 0721   RDW 17.2 (H) 05/15/2023 0721   Iron Studies    Component Value Date/Time   IRON 37 (L) 05/22/2023 0941   TIBC 525.0 (H) 05/22/2023 0941   FERRITIN 5.0 (L) 05/22/2023 0941   IRONPCTSAT 7.0 (L) 05/22/2023 0941   IRONPCTSAT 5 (L)  05/28/2017 1647   Lipid Panel     Component Value Date/Time   CHOL 161 05/15/2023 0721   CHOL 168 07/31/2021 0905   TRIG 124.0 05/15/2023 0721   HDL 56.20 05/15/2023 0721   HDL 67 07/31/2021 0905   CHOLHDL 3 05/15/2023 0721   VLDL 24.8 05/15/2023 0721   LDLCALC 80 05/15/2023 0721   LDLCALC 85 07/31/2021 0905   Hepatic Function Panel     Component Value Date/Time   PROT 7.2 05/15/2023 0721   ALBUMIN 4.3 05/15/2023 0721   AST 16 05/15/2023 0721   ALT 14 05/15/2023 0721   ALKPHOS 81 05/15/2023 0721   BILITOT 0.5 05/15/2023 0721   BILIDIR 0.1 12/11/2019 0852   IBILI 0.6 12/11/2019 0852      Component Value Date/Time   TSH 1.69 05/15/2023 0721   Nutritional Lab Results  Component Value Date   VD25OH 54.40 03/14/2022   VD25OH 33.36 09/30/2017   VD25OH 32.96 06/12/2017    Follow-Up   Return in about 3 weeks (around 07/17/2023) for For Weight Mangement with Dr. Allie Area.Aaron Aas She was informed of the importance of frequent follow up visits to maximize her success with intensive lifestyle modifications for her multiple health conditions.  Attestation Statement   Reviewed by clinician on day of visit: allergies, medications, problem list, medical history, surgical history, family history, social history, and previous encounter notes.     Ladd Picker, MD

## 2023-06-26 NOTE — Assessment & Plan Note (Signed)
Presurgical weight 340 pounds, nadir 200 and a year and a half.  Started to regain weight in 2017 after divorce.  Her weight regain is multifactorial and likely secondary to behavioral and metabolic maladaptations following gastric bypass.

## 2023-06-26 NOTE — Assessment & Plan Note (Signed)
 She has increased orexigenic signaling, impaired satiety and inhibitory control. This is secondary to an abnormal energy regulation system and pathological neurohormonal pathways characteristic of excess adiposity.  In addition to nutritional and behavioral strategies she benefits from pharmacotherapy with GLP-1.

## 2023-06-26 NOTE — Assessment & Plan Note (Signed)
 Patient currently on metformin  twice daily for pharmacoprophylaxis without any adverse effects.  She will continue current medication.  We discussed the use of GLP-1 for pharmacoprophylaxis and also weight management.  She will be prescribed Zepbound  2.5 mg once a week

## 2023-06-27 ENCOUNTER — Ambulatory Visit (INDEPENDENT_AMBULATORY_CARE_PROVIDER_SITE_OTHER): Payer: HMO | Admitting: Family Medicine

## 2023-06-27 ENCOUNTER — Encounter: Payer: Self-pay | Admitting: Family Medicine

## 2023-06-27 VITALS — BP 100/62 | HR 74 | Temp 98.1°F | Ht 66.0 in | Wt 258.0 lb

## 2023-06-27 DIAGNOSIS — R300 Dysuria: Secondary | ICD-10-CM

## 2023-06-27 DIAGNOSIS — N907 Vulvar cyst: Secondary | ICD-10-CM | POA: Diagnosis not present

## 2023-06-27 DIAGNOSIS — N898 Other specified noninflammatory disorders of vagina: Secondary | ICD-10-CM | POA: Diagnosis not present

## 2023-06-27 LAB — POC URINALSYSI DIPSTICK (AUTOMATED)
Bilirubin, UA: NEGATIVE
Glucose, UA: NEGATIVE
Ketones, UA: NEGATIVE
Leukocytes, UA: NEGATIVE
Nitrite, UA: NEGATIVE
Protein, UA: POSITIVE — AB
Spec Grav, UA: 1.025 (ref 1.010–1.025)
Urobilinogen, UA: 0.2 U/dL
pH, UA: 5.5 (ref 5.0–8.0)

## 2023-06-27 MED ORDER — CLINDAMYCIN HCL 300 MG PO CAPS: 300.0000 mg | ORAL_CAPSULE | Freq: Two times a day (BID) | ORAL | 0 refills | Status: AC

## 2023-06-27 NOTE — Assessment & Plan Note (Addendum)
Acute, thin gray discharge most consistent with bacterial vaginosis.  Will send wet prep for evaluation. Given SE to metronidazole in past will treat with

## 2023-06-27 NOTE — Assessment & Plan Note (Signed)
Acute, sore and slightly irritated sebaceous cyst of left labia.  No fluctuance.  Possible bacterial infection of this cyst. Will treat with antibiotics.  She will apply warm compresses.  Return and ER precautions provided

## 2023-06-27 NOTE — Progress Notes (Signed)
Patient ID: Kimberly Patton, female    DOB: [birth year], 68 y.o.   MRN: [id number]  This visit was conducted in person.  BP 100/62 (BP Location: Left Arm, Patient Position: Sitting, Cuff Size: Large)   Pulse 74   Temp 98.1 F (36.7 C) (Temporal)   Ht 5\' 6"  (1.676 m)   Wt 258 lb (117 kg)   SpO2 99%   BMI 41.64 kg/m    CC:  Chief Complaint  Patient presents with   Vaginal Discharge   Vaginal Soreness    Subjective:   HPI: Kimberly Patton is a 68 y.o. female presenting on 06/27/2023 for Vaginal Discharge and Vaginal Soreness  New onset vaginal discharge and soreness: ongoing for  5 days  Lately she has noted underwear seems more wet for weeks... clear, slightly more brown.  Has history of bumps on labia at vaginal opening , but now they are more sore.  She has noted some redness.   Notes dysuria ( hard to tell if it is only when urine hits skin), no urine odor. She always has urinary frequency..  No fever. No new abdominal pain, no flank pain.  Always has some low back pain.    She has tried to treat with Using palmers ointment ( cocoa butter)      Relevant past medical, surgical, family and social history reviewed and updated as indicated. Interim medical history since our last visit reviewed. Allergies and medications reviewed and updated. Outpatient Medications Prior to Visit  Medication Sig Dispense Refill   apixaban (ELIQUIS) 5 MG TABS tablet Take 1 tablet (5 mg total) by mouth 2 (two) times daily. 60 tablet 11   Calcium Citrate-Vitamin D (CITRACAL + D PO) Take 2 tablets by mouth daily.      Cholecalciferol (VITAMIN D3) 1.25 MG (50000 UT) CAPS TAKE 1 CAPSULE (50,000 UNITS TOTAL) BY MOUTH ONCE A WEEK. TUESDAY 12 capsule 0   Cholecalciferol (VITAMIN D3) 1.25 MG (50000 UT) CAPS Take 1 capsule (50,000 Units total) by mouth once a week. Tuesday 12 capsule 0   Coenzyme Q10-Vitamin E (QUNOL ULTRA COQ10) 100-150 MG-UNIT CAPS Take 100 mg by mouth daily.     Cyanocobalamin  (VITAMIN B-12) 2500 MCG SUBL Place 2,500 mcg under the tongue daily.     diltiazem (CARDIZEM) 30 MG tablet Take 30 mg by mouth 2 (two) times daily as needed.     esomeprazole (NEXIUM) 40 MG capsule Take 40 mg by mouth daily.     gabapentin (NEURONTIN) 100 MG capsule Take 100 mg by mouth daily.     Lactobacillus Rhamnosus, GG, (CULTURELLE IMMUNITY SUPPORT PO) Take 1 capsule by mouth daily.     levalbuterol (XOPENEX HFA) 45 MCG/ACT inhaler Inhale 2 puffs into the lungs every 6 (six) hours as needed for wheezing. 1 each 2   Menaquinone-7 (VITAMIN K2 PO) Take 100 mg by mouth daily.     metFORMIN (GLUCOPHAGE-XR) 500 MG 24 hr tablet Take 1 tablet (500 mg total) by mouth 2 (two) times daily with a meal. 180 tablet 0   methocarbamol (ROBAXIN) 500 MG tablet Take 500 mg by mouth at bedtime as needed for muscle spasms.     montelukast (SINGULAIR) 10 MG tablet Take 1 tablet (10 mg total) by mouth daily. 90 tablet 3   Multiple Vitamins-Minerals (CENTRUM SILVER 50+WOMEN PO) Take 1 tablet by mouth daily.     nitroGLYCERIN (NITROSTAT) 0.4 MG SL tablet Place 1 tablet (0.4 mg total) under the tongue every  5 (five) minutes as needed for chest pain. 25 tablet 3   Riboflavin 400 MG CAPS Take 400 mg by mouth daily.     rosuvastatin (CRESTOR) 10 MG tablet Take 1 tablet (10 mg total) by mouth daily. 90 tablet 3   spironolactone (ALDACTONE) 25 MG tablet Take 1 tablet (25 mg total) by mouth daily. 90 tablet 3   tirzepatide (ZEPBOUND) 2.5 MG/0.5ML Pen Inject 2.5 mg into the skin once a week. 2 mL 0   traMADol (ULTRAM) 50 MG tablet Take 1 tablet (50 mg total) by mouth every 6 (six) hours as needed. 12 tablet 0   TURMERIC PO Take 2 tablets by mouth daily. 500 mg  With Ginger 50 mg Gummies     verapamil (CALAN-SR) 120 MG CR tablet Take 1 tablet (120 mg total) by mouth 2 (two) times daily. 180 tablet 3   Zoledronic Acid (RECLAST IV) Inject into the vein. Yearly injection     Facility-Administered Medications Prior to Visit   Medication Dose Route Frequency Provider Last Rate Last Admin   albuterol (PROVENTIL) (2.5 MG/3ML) 0.083% nebulizer solution 2.5 mg  2.5 mg Nebulization Once Salena Saner, MD         Per HPI unless specifically indicated in ROS section below Review of Systems  Constitutional:  Negative for fatigue and fever.  HENT:  Negative for congestion.   Eyes:  Negative for pain.  Respiratory:  Negative for cough and shortness of breath.   Cardiovascular:  Negative for chest pain, palpitations and leg swelling.  Gastrointestinal:  Negative for abdominal pain.  Genitourinary:  Positive for genital sores and vaginal discharge. Negative for dysuria and vaginal bleeding.  Musculoskeletal:  Negative for back pain.  Neurological:  Negative for syncope, light-headedness and headaches.  Psychiatric/Behavioral:  Negative for dysphoric mood.    Objective:  BP 100/62 (BP Location: Left Arm, Patient Position: Sitting, Cuff Size: Large)   Pulse 74   Temp 98.1 F (36.7 C) (Temporal)   Ht 5\' 6"  (1.676 m)   Wt 258 lb (117 kg)   SpO2 99%   BMI 41.64 kg/m   Wt Readings from Last 3 Encounters:  06/27/23 258 lb (117 kg)  06/26/23 258 lb (117 kg)  05/31/23 262 lb (118.8 kg)      Physical Exam Exam conducted with a chaperone present.  Constitutional:      General: She is not in acute distress.    Appearance: Normal appearance. She is well-developed. She is not ill-appearing or toxic-appearing.  HENT:     Head: Normocephalic.     Right Ear: Hearing, tympanic membrane, ear canal and external ear normal. Tympanic membrane is not erythematous, retracted or bulging.     Left Ear: Hearing, tympanic membrane, ear canal and external ear normal. Tympanic membrane is not erythematous, retracted or bulging.     Nose: No mucosal edema or rhinorrhea.     Right Sinus: No maxillary sinus tenderness or frontal sinus tenderness.     Left Sinus: No maxillary sinus tenderness or frontal sinus tenderness.      Mouth/Throat:     Pharynx: Uvula midline.  Eyes:     General: Lids are normal. Lids are everted, no foreign bodies appreciated.     Conjunctiva/sclera: Conjunctivae normal.     Pupils: Pupils are equal, round, and reactive to light.  Neck:     Thyroid: No thyroid mass or thyromegaly.     Vascular: No carotid bruit.     Trachea: Trachea normal.  Cardiovascular:     Rate and Rhythm: Normal rate and regular rhythm.     Pulses: Normal pulses.     Heart sounds: Normal heart sounds, S1 normal and S2 normal. No murmur heard.    No friction rub. No gallop.  Pulmonary:     Effort: Pulmonary effort is normal. No tachypnea or respiratory distress.     Breath sounds: Normal breath sounds. No decreased breath sounds, wheezing, rhonchi or rales.  Abdominal:     General: Bowel sounds are normal.     Palpations: Abdomen is soft.     Tenderness: There is no abdominal tenderness.     Hernia: There is no hernia in the left inguinal area or right inguinal area.  Genitourinary:    Exam position: Lithotomy position.     Labia:        Left: Tenderness and lesion present.      Urethra: No prolapse, urethral pain, urethral swelling or urethral lesion.     Vagina: Vaginal discharge and erythema present.     Comments: Multiple vaginal cyst... area on left labia sore and slightly red, no fluctuance Musculoskeletal:     Cervical back: Normal range of motion and neck supple.  Lymphadenopathy:     Lower Body: No right inguinal adenopathy. No left inguinal adenopathy.  Skin:    General: Skin is warm and dry.     Findings: No rash.  Neurological:     Mental Status: She is alert.  Psychiatric:        Mood and Affect: Mood is not anxious or depressed.        Speech: Speech normal.        Behavior: Behavior normal. Behavior is cooperative.        Thought Content: Thought content normal.        Judgment: Judgment normal.       Results for orders placed or performed in visit on 06/27/23  POCT Urinalysis  Dipstick (Automated)   Collection Time: 06/27/23  8:55 AM  Result Value Ref Range   Color, UA Yellow    Clarity, UA Clear    Glucose, UA Negative Negative   Bilirubin, UA Negative    Ketones, UA Negative    Spec Grav, UA 1.025 1.010 - 1.025   Blood, UA Trace    pH, UA 5.5 5.0 - 8.0   Protein, UA Positive (A) Negative   Urobilinogen, UA 0.2 0.2 or 1.0 E.U./dL   Nitrite, UA Negative    Leukocytes, UA Negative Negative   *Note: Due to a large number of results and/or encounters for the requested time period, some results have not been displayed. A complete set of results can be found in Results Review.    Assessment and Plan  Dysuria Assessment & Plan: Acute, symptoms not clearly consistent with UTI.  Urinalysis essentially clear except for trace blood.  Will send for culture to verify no bladder infection.  Orders: -     POCT Urinalysis Dipstick (Automated) -     Urine Culture  Vaginal irritation Assessment & Plan: Acute, thin gray discharge most consistent with bacterial vaginosis.  Will send wet prep for evaluation. Start metronidazole oral.  Orders: -     WET PREP BY MOLECULAR PROBE  Sebaceous cyst of labia Assessment & Plan: Acute, sore and slightly irritated sebaceous cyst of left labia.  No fluctuance.  Possible bacterial infection of this cyst. Will treat with antibiotics.  She will apply warm compresses.  Return and ER precautions  provided   Other orders -     Clindamycin HCl; Take 1 capsule (300 mg total) by mouth 2 (two) times daily for 7 days.  Dispense: 14 capsule; Refill: 0    No follow-ups on file.   Kerby Nora, MD

## 2023-06-27 NOTE — Progress Notes (Signed)
Kimberly Patton notified by telephone that Dr. Ermalene Searing sent her in Clindamycin instead of metronidazole due to that being on her allergy list.  Patient states she has never been treated for C-Diff.

## 2023-06-27 NOTE — Assessment & Plan Note (Signed)
Acute, symptoms not clearly consistent with UTI.  Urinalysis essentially clear except for trace blood.  Will send for culture to verify no bladder infection.

## 2023-06-28 LAB — WET PREP BY MOLECULAR PROBE
Candida species: NOT DETECTED
MICRO NUMBER:: 15964606
SPECIMEN QUALITY:: ADEQUATE
Trichomonas vaginosis: NOT DETECTED

## 2023-06-29 LAB — URINE CULTURE
MICRO NUMBER:: 15964605
SPECIMEN QUALITY:: ADEQUATE

## 2023-07-02 ENCOUNTER — Other Ambulatory Visit: Payer: Self-pay | Admitting: Family Medicine

## 2023-07-02 ENCOUNTER — Encounter: Payer: Self-pay | Admitting: Family Medicine

## 2023-07-03 ENCOUNTER — Other Ambulatory Visit: Payer: Self-pay | Admitting: Family Medicine

## 2023-07-03 MED ORDER — NITROFURANTOIN MONOHYD MACRO 100 MG PO CAPS: 100.0000 mg | ORAL_CAPSULE | Freq: Two times a day (BID) | ORAL | 0 refills | Status: DC

## 2023-07-15 ENCOUNTER — Ambulatory Visit
Admission: RE | Admit: 2023-07-15 | Discharge: 2023-07-15 | Disposition: A | Discharge status: Home / Self Care | Payer: HMO | Source: Ambulatory Visit | Attending: Family Medicine | Admitting: Family Medicine

## 2023-07-15 ENCOUNTER — Encounter: Payer: Self-pay | Admitting: Oncology

## 2023-07-15 DIAGNOSIS — Z1231 Encounter for screening mammogram for malignant neoplasm of breast: Secondary | ICD-10-CM | POA: Diagnosis not present

## 2023-07-18 ENCOUNTER — Ambulatory Visit (INDEPENDENT_AMBULATORY_CARE_PROVIDER_SITE_OTHER): Payer: HMO | Admitting: Internal Medicine

## 2023-07-18 ENCOUNTER — Encounter (INDEPENDENT_AMBULATORY_CARE_PROVIDER_SITE_OTHER): Payer: Self-pay

## 2023-07-29 ENCOUNTER — Other Ambulatory Visit: Payer: Self-pay

## 2023-07-29 ENCOUNTER — Inpatient Hospital Stay: Payer: HMO | Attending: Oncology

## 2023-07-29 DIAGNOSIS — D509 Iron deficiency anemia, unspecified: Secondary | ICD-10-CM | POA: Diagnosis not present

## 2023-07-29 DIAGNOSIS — E611 Iron deficiency: Secondary | ICD-10-CM

## 2023-07-29 LAB — CBC (CANCER CENTER ONLY)
HCT: 40 % (ref 36.0–46.0)
Hemoglobin: 13.4 g/dL (ref 12.0–15.0)
MCH: 29.5 pg (ref 26.0–34.0)
MCHC: 33.5 g/dL (ref 30.0–36.0)
MCV: 87.9 fL (ref 80.0–100.0)
Platelet Count: 237 10*3/uL (ref 150–400)
RBC: 4.55 MIL/uL (ref 3.87–5.11)
RDW: 16.7 % — ABNORMAL HIGH (ref 11.5–15.5)
WBC Count: 6.2 10*3/uL (ref 4.0–10.5)
nRBC: 0 % (ref 0.0–0.2)

## 2023-07-29 LAB — IRON AND TIBC
Iron: 69 ug/dL (ref 28–170)
Saturation Ratios: 16 % (ref 10.4–31.8)
TIBC: 428 ug/dL (ref 250–450)
UIBC: 359 ug/dL

## 2023-07-29 LAB — FERRITIN: Ferritin: 64 ng/mL (ref 11–307)

## 2023-07-31 ENCOUNTER — Ambulatory Visit (INDEPENDENT_AMBULATORY_CARE_PROVIDER_SITE_OTHER): Payer: HMO | Admitting: Internal Medicine

## 2023-08-01 ENCOUNTER — Other Ambulatory Visit: Payer: HMO

## 2023-08-02 ENCOUNTER — Encounter: Payer: Self-pay | Admitting: Oncology

## 2023-08-09 ENCOUNTER — Other Ambulatory Visit: Payer: Self-pay | Admitting: Cardiology

## 2023-08-09 MED ORDER — SPIRONOLACTONE 25 MG PO TABS: 25.0000 mg | ORAL_TABLET | Freq: Every day | ORAL | 3 refills | Status: DC

## 2023-08-09 NOTE — Telephone Encounter (Signed)
 Refill sent to pharmacy as requested

## 2023-08-10 ENCOUNTER — Other Ambulatory Visit (INDEPENDENT_AMBULATORY_CARE_PROVIDER_SITE_OTHER): Payer: Self-pay | Admitting: Internal Medicine

## 2023-08-10 DIAGNOSIS — R7303 Prediabetes: Secondary | ICD-10-CM

## 2023-08-11 ENCOUNTER — Other Ambulatory Visit (INDEPENDENT_AMBULATORY_CARE_PROVIDER_SITE_OTHER): Payer: Self-pay | Admitting: Internal Medicine

## 2023-08-11 DIAGNOSIS — R7303 Prediabetes: Secondary | ICD-10-CM

## 2023-08-12 ENCOUNTER — Encounter (INDEPENDENT_AMBULATORY_CARE_PROVIDER_SITE_OTHER): Payer: Self-pay | Admitting: Internal Medicine

## 2023-08-12 NOTE — Telephone Encounter (Signed)
 Called pt, she has enough metformin until her appt Wednesday and will get a refill from [facility]

## 2023-08-14 ENCOUNTER — Ambulatory Visit (INDEPENDENT_AMBULATORY_CARE_PROVIDER_SITE_OTHER): Payer: HMO | Admitting: Physician Assistant

## 2023-08-14 ENCOUNTER — Encounter: Payer: Self-pay | Admitting: Cardiology

## 2023-08-14 ENCOUNTER — Encounter (INDEPENDENT_AMBULATORY_CARE_PROVIDER_SITE_OTHER): Payer: Self-pay | Admitting: Physician Assistant

## 2023-08-14 VITALS — BP 108/72 | HR 81 | Temp 97.6°F | Ht 66.0 in | Wt 266.0 lb

## 2023-08-14 DIAGNOSIS — Z6841 Body Mass Index (BMI) 40.0 and over, adult: Secondary | ICD-10-CM | POA: Diagnosis not present

## 2023-08-14 DIAGNOSIS — E66813 Obesity, class 3: Secondary | ICD-10-CM | POA: Diagnosis not present

## 2023-08-14 DIAGNOSIS — E7800 Pure hypercholesterolemia, unspecified: Secondary | ICD-10-CM

## 2023-08-14 DIAGNOSIS — R600 Localized edema: Secondary | ICD-10-CM | POA: Diagnosis not present

## 2023-08-14 DIAGNOSIS — Z7901 Long term (current) use of anticoagulants: Secondary | ICD-10-CM

## 2023-08-14 DIAGNOSIS — R7303 Prediabetes: Secondary | ICD-10-CM

## 2023-08-14 DIAGNOSIS — Z9884 Bariatric surgery status: Secondary | ICD-10-CM | POA: Diagnosis not present

## 2023-08-14 DIAGNOSIS — I480 Paroxysmal atrial fibrillation: Secondary | ICD-10-CM | POA: Diagnosis not present

## 2023-08-14 DIAGNOSIS — I10 Essential (primary) hypertension: Secondary | ICD-10-CM

## 2023-08-14 DIAGNOSIS — M810 Age-related osteoporosis without current pathological fracture: Secondary | ICD-10-CM

## 2023-08-14 DIAGNOSIS — Z79899 Other long term (current) drug therapy: Secondary | ICD-10-CM

## 2023-08-14 MED ORDER — ZEPBOUND 2.5 MG/0.5ML ~~LOC~~ SOAJ: 2.5000 mg | SUBCUTANEOUS | 0 refills | Status: DC

## 2023-08-14 MED ORDER — METFORMIN HCL ER 500 MG PO TB24: 500.0000 mg | ORAL_TABLET | Freq: Two times a day (BID) | ORAL | 0 refills | Status: DC

## 2023-08-14 NOTE — Progress Notes (Unsigned)
 SUBJECTIVE: Discussed the use of AI scribe software for clinical note transcription with the patient, who gave verbal consent to proceed.  Chief Complaint: Obesity  Interim History: She is up 8 lbs since her last visit 06/26/23. Bio impedence scale reviewed with the patient:  Muscle mass - 0.4 lbs Adipose mass + 8.4 lbs Total body water +5.8 lbs  Kimberly Patton is here to discuss her progress with her obesity treatment plan. She is on the Category 2 Plan and keeping a food journal and adhering to recommended goals of 1200 calories and 90 grams of protein and states she is not following her eating plan approximately 0 % of the time. She states she is exercising going to PT/chair yoga 60/28 minutes 2/2 times per week.  Kimberly Patton is a 68 year old female who presents for follow-up of her obesity treatment plan.  She is experiencing difficulty maintaining her calorie goals, although she is meeting her protein intake targets. She is consuming 1200 calories per day with 90 to 100 grams of protein. Despite these efforts, her weight has increased by eight pounds since her last visit two months ago. She attributes some of this weight gain to stress and believes she is retaining water, as her legs are swollen. She underwent a laparoscopic sleeve gastrectomy in December 2014. Her current medications include metformin 500 mg twice daily, vitamin B12 2500 mcg daily, Cardizem 30 mg twice daily, Nexium 40 mg once daily, a multivitamin once daily, verapamil SR 120 mg twice daily, Eliquis 5 mg once daily, Crestor 10 mg daily, and spironolactone 25 mg daily.  She reports significant lower extremity edema, noting an increase of six pounds in water weight. She uses compression stockings and a compression pump nightly to manage the swelling. No shortness of breath, chest pain, or palpitations, and her heart rates have not been consistently over 100 bpm except during exercise. She is mindful of her sodium intake but  admits to consuming salty snacks like skinny popcorn, which may contribute to her fluid retention.  She has a history of paroxysmal atrial fibrillation and is currently on Eliquis. She monitors her heart rate regularly and reports being in sinus rhythm with no recent episodes of atrial fibrillation. She has not experienced any symptoms since her hospitalization in March of the previous year.  She is managing her diet to address iron deficiency, having received two iron infusions in January. Her iron levels are currently normal. She is trying to balance her protein intake with her calorie goals, consuming three protein shakes daily, each containing 30 grams of protein. OBJECTIVE: Visit Diagnoses: Problem List Items Addressed This Visit     Primary hypertension   Class 3 severe obesity with serious comorbidity and body mass index (BMI) of 40.0 to 44.9 in adult [facility])   Relevant Medications   tirzepatide (ZEPBOUND) 2.5 MG/0.5ML Pen   metFORMIN (GLUCOPHAGE-XR) 500 MG 24 hr tablet   Status post laparoscopic sleeve gastrectomy Dec 2014   Relevant Medications   tirzepatide (ZEPBOUND) 2.5 MG/0.5ML Pen   Age-related osteoporosis without current pathological fracture   Hypercholesteremia   Prediabetes - Primary   Relevant Medications   tirzepatide (ZEPBOUND) 2.5 MG/0.5ML Pen   metFORMIN (GLUCOPHAGE-XR) 500 MG 24 hr tablet   Other Visit Diagnoses       Obesity with starting BMI of 44.3       Relevant Medications   tirzepatide (ZEPBOUND) 2.5 MG/0.5ML Pen   metFORMIN (GLUCOPHAGE-XR) 500 MG 24 hr tablet  Obesity 68 year old female with obesity, gained 8 pounds since last visit, 6 pounds attributed to water weight. On a 1200 calorie/day diet with 90-100 grams of protein, struggling with caloric intake due to stress. Discussed reducing protein shakes and monitoring caloric intake. Advised limiting sodium intake to manage water retention. - Reduce protein shakes to one per day. - Monitor caloric  intake to stay within 1200-1300 calories per day. - Limit sodium intake to around 2 grams daily.  Lower Extremity Edema Significant leg swelling attributed to water weight. Uses compression stockings and a compression pump nightly. No shortness of breath, chest pain, or palpitations. Last cardiology visit in September 2024. Discussed contacting cardiology for potential diuretic adjustment and possible echocardiogram re-evaluation. - Contact cardiology to discuss increased water weight and potential diuretic adjustment. - Consider echocardiogram re-evaluation if indicated by cardiology.  Paroxysmal Atrial Fibrillation On Eliquis 5 mg daily, no AFib symptoms since March 2024. Monitors heart rate with [employer]. Discussed with cardiology the potential to discontinue Eliquis. - Discuss with cardiology the potential to discontinue Eliquis.  Prediabetes On metformin 500 mg twice daily. Requested a refill. - Send metformin refill to [facility].  Hypertension On Cardizem, verapamil SR, and spironolactone. Blood pressure well-managed. - Continue current antihypertensive regimen.  Hypercholesterolemia On Crestor 10 mg daily. - Continue Crestor 10 mg daily.  Age-related Osteoporosis On multivitamin and vitamin B12 supplementation. - Continue multivitamin and vitamin B12 supplementation.  General Health Maintenance Managing multiple chronic conditions. Advised on using a cast iron skillet to help with iron intake. - Advise on the use of a cast iron skillet to help with iron intake.  Follow-up - Follow up with cardiology regarding increased water weight and potential diuretic adjustment. - Attend scheduled appointments with pulmonary and rheumatology. - Follow up with Dr. Rikki Spearing in April 2025.  Vitals Temp: 97.6 F (36.4 C) BP: 108/72 Pulse Rate: 81 SpO2: 94 %   Anthropometric Measurements Height: 5\' 6"  (1.676 m) Weight: 266 lb (120.7 kg) BMI (Calculated): 42.95 Weight at Last  Visit: 258lb Weight Lost Since Last Visit: 0 Weight Gained Since Last Visit: 8lb Starting Weight: 275lb Total Weight Loss (lbs): 9 lb (4.082 kg)   Body Composition  Body Fat %: 54.3 % Fat Mass (lbs): 144.4 lbs Muscle Mass (lbs): 115.4 lbs Total Body Water (lbs): 92.4 lbs Visceral Fat Rating : 19   Other Clinical Data Fasting: no Labs: no Today's Visit #: 9 Starting Date: 01/08/23     ASSESSMENT AND PLAN:  Diet: Kimberly Patton is currently in the action stage of change. As such, her goal is to continue with weight loss efforts and has agreed to {MWMwtlossportion/plan2:23431}.   Exercise:  {MWM Exercise Recommendations:210964029}  Behavior Modification:  We discussed the following Behavioral Modification Strategies today: {HWW Behavior Modification:[id number]}. We discussed various medication options to help Shadi with her weight loss efforts and we both agreed to ***.  Return in about 4 weeks (around 09/11/2023).Marland Kitchen She was informed of the importance of frequent follow up visits to maximize her success with intensive lifestyle modifications for her multiple health conditions.  Attestation Statements:   Reviewed by clinician on day of visit: allergies, medications, problem list, medical history, surgical history, family history, social history, and previous encounter notes.   Time spent on visit including pre-visit chart review and post-visit care and charting was *** minutes  Naheim Burgen,PA-C

## 2023-08-15 ENCOUNTER — Encounter (INDEPENDENT_AMBULATORY_CARE_PROVIDER_SITE_OTHER): Payer: Self-pay | Admitting: Physician Assistant

## 2023-08-15 MED ORDER — FUROSEMIDE 20 MG PO TABS: 20.0000 mg | ORAL_TABLET | Freq: Every day | ORAL | 3 refills | Status: AC | PRN

## 2023-08-21 DIAGNOSIS — K449 Diaphragmatic hernia without obstruction or gangrene: Secondary | ICD-10-CM | POA: Diagnosis not present

## 2023-08-21 DIAGNOSIS — Z9889 Other specified postprocedural states: Secondary | ICD-10-CM | POA: Diagnosis not present

## 2023-08-21 DIAGNOSIS — Z8719 Personal history of other diseases of the digestive system: Secondary | ICD-10-CM | POA: Diagnosis not present

## 2023-08-21 DIAGNOSIS — K2100 Gastro-esophageal reflux disease with esophagitis, without bleeding: Secondary | ICD-10-CM | POA: Diagnosis not present

## 2023-08-21 DIAGNOSIS — Z9884 Bariatric surgery status: Secondary | ICD-10-CM | POA: Diagnosis not present

## 2023-08-22 DIAGNOSIS — H15101 Unspecified episcleritis, right eye: Secondary | ICD-10-CM | POA: Diagnosis not present

## 2023-08-26 DIAGNOSIS — Z79899 Other long term (current) drug therapy: Secondary | ICD-10-CM | POA: Diagnosis not present

## 2023-08-27 LAB — BASIC METABOLIC PANEL
BUN/Creatinine Ratio: 43 — ABNORMAL HIGH (ref 12–28)
BUN: 27 mg/dL (ref 8–27)
CO2: 24 mmol/L (ref 20–29)
Calcium: 9.9 mg/dL (ref 8.7–10.3)
Chloride: 101 mmol/L (ref 96–106)
Creatinine, Ser: 0.63 mg/dL (ref 0.57–1.00)
Glucose: 78 mg/dL (ref 70–99)
Potassium: 4 mmol/L (ref 3.5–5.2)
Sodium: 143 mmol/L (ref 134–144)
eGFR: 97 mL/min/{1.73_m2} (ref >59)

## 2023-08-29 ENCOUNTER — Encounter (INDEPENDENT_AMBULATORY_CARE_PROVIDER_SITE_OTHER): Payer: Self-pay | Admitting: Cardiology

## 2023-08-29 DIAGNOSIS — I480 Paroxysmal atrial fibrillation: Secondary | ICD-10-CM | POA: Diagnosis not present

## 2023-08-29 DIAGNOSIS — M159 Polyosteoarthritis, unspecified: Secondary | ICD-10-CM | POA: Diagnosis not present

## 2023-08-30 NOTE — Telephone Encounter (Signed)
Please see the MyChart message reply(ies) for my assessment and plan.    This patient gave consent for this Medical Advice Message and is aware that it may result in a bill to their insurance company, as well as the possibility of receiving a bill for a co-payment or deductible. They are an established patient, but are not seeking medical advice exclusively about a problem treated during an in person or video visit in the last seven days. I did not recommend an in person or video visit within seven days of my reply.    I spent a total of 15 minutes cumulative time within 7 days through MyChart messaging.  Shameeka Silliman Agbor-Etang, MD   

## 2023-09-04 ENCOUNTER — Ambulatory Visit: Payer: HMO | Admitting: Pulmonary Disease

## 2023-09-04 ENCOUNTER — Encounter: Payer: Self-pay | Admitting: Pulmonary Disease

## 2023-09-04 VITALS — BP 126/86 | HR 59 | Temp 97.7°F | Ht 66.0 in | Wt 264.8 lb

## 2023-09-04 DIAGNOSIS — I480 Paroxysmal atrial fibrillation: Secondary | ICD-10-CM | POA: Diagnosis not present

## 2023-09-04 DIAGNOSIS — R600 Localized edema: Secondary | ICD-10-CM

## 2023-09-04 DIAGNOSIS — M2550 Pain in unspecified joint: Secondary | ICD-10-CM | POA: Diagnosis not present

## 2023-09-04 DIAGNOSIS — R0602 Shortness of breath: Secondary | ICD-10-CM

## 2023-09-04 DIAGNOSIS — J4520 Mild intermittent asthma, uncomplicated: Secondary | ICD-10-CM

## 2023-09-04 LAB — NITRIC OXIDE: Nitric Oxide: 13

## 2023-09-04 NOTE — Patient Instructions (Addendum)
 VISIT SUMMARY:  Today, we reviewed your respiratory condition and other health concerns. Your breathing is stable, and you are considering discontinuing montelukast. We also discussed your difficulties with weight loss, joint pain, and your history of AFib and osteoporosis. We have made several recommendations to help manage your conditions.  YOUR PLAN:  -MILD INTERMITTENT ASTHMA: Your asthma symptoms are well-managed, and you have not needed your inhaler recently. We will discontinue montelukast as it is not favored for long-term use. You can continue using your inhaler as needed for any respiratory symptoms.  Level of inflammation today was low indicating that your airways do not have excessive inflammation.  -ATRIAL FIBRILLATION: Atrial fibrillation is an irregular and often rapid heart rate. You have been on blood thinners for over a year without any episodes of AFib. We recommend discussing with your cardiologist the possibility of discontinuing anticoagulation therapy.  -OSTEOPOROSIS: Osteoporosis is a condition where bones become weak and brittle. You have had severe reactions to osteoporosis treatments like Prolia and Reclast. We will avoid these medications and steroids due to their potential risks.  -LIPEDEMA: Lipedema is a condition that causes an abnormal accumulation of fat in the legs. You experience heavy and achy legs. We recommend lymphatic drainage massage and adopting a Mediterranean diet to help manage your symptoms. We will also refer you to the lipedema foundation for further support (www.lipedema.org).  -JOINT PAIN: You have joint pain and swelling, particularly in your hands. You have started duloxetine for pain management. We will discuss with your rheumatologist whether continuing duloxetine is necessary and await the results of your hand x-rays.  INSTRUCTIONS:  Please follow up with your cardiologist to discuss the possibility of discontinuing anticoagulation therapy.  Additionally, await the results of your hand x-rays and discuss with your rheumatologist about the necessity of continuing duloxetine.  Follow-up in 6 months time call sooner should any new problems arise.

## 2023-09-04 NOTE — Progress Notes (Signed)
 Subjective:    Patient ID: Kimberly Patton, female    DOB: 1955-07-20, 68 y.o.   MRN: [id number]  Patient Care Team: Excell Seltzer, MD as PCP - General (Family Medicine) Debbe Odea, MD as PCP - Cardiology (Cardiology) Lanier Prude, MD as PCP - Electrophysiology (Cardiology) Heniford, Rodolph Bong, MD as Referring Physician (Surgery) Salena Saner, MD as Consulting Physician (Pulmonary Disease) Darnell Level, MD as Consulting Physician (General Surgery) Galen Manila, MD as Referring Physician (Ophthalmology) Creig Hines, MD as Consulting Physician (Oncology)  Chief Complaint  Patient presents with   Follow-up    No cough or wheezing. Shortness of breath on exertion.     BACKGROUND/INTERVAL:  HPI Discussed the use of AI scribe software for clinical note transcription with the patient, who gave verbal consent to proceed.  History of Present Illness   Kimberly Patton is a 68 year old female who presents for evaluation of her respiratory condition and other health concerns.  Her respiratory condition is currently well-managed, and she has not used her inhaler in a long time. She feels her breathing is stable and is considering discontinuing montelukast as her symptoms are well-controlled.  She is experiencing difficulty with weight loss, particularly in her stomach and legs, despite efforts at a wellness center. She mentions having an 'abnormal metabolism' and being prediabetic, which affects her ability to receive certain treatments. She has been retaining water, gaining eight pounds in the last month, and is currently on a diuretic. Her kidneys and liver have been checked and are functioning well.  She describes a history of surgeries affecting her abdominal muscles and notes that her legs have always been large but have increased in size. She uses compression garments and a compression machine at night. She was previously thought to have lymphedema but after  discussion today it appears that she does have lipedema, which affects fat deposition in her legs.  She experiences joint pain and swelling, particularly in her hands, and has been started on duloxetine for pain management. She has a history of AFib and has been on blood thinners for over a year without recurrence of AFib. She is considering discontinuing some medications, including montelukast, as her respiratory symptoms are well-controlled.  She has a history of osteoporosis and has had severe reactions to treatments like Prolia and Reclast, leading to emergency room visits. She is unable to take steroids due to their potential to trigger AFib.      Review of Systems A 10 point review of systems was performed and it is as noted above otherwise negative.   Patient Active Problem List   Diagnosis Date Noted   Sebaceous cyst of labia 06/27/2023   Vaginal irritation 06/27/2023   Abnormal food appetite 06/26/2023   Iron deficiency 05/23/2023   Dizziness 01/29/2023   Prediabetes 01/08/2023   Abnormal metabolism 01/08/2023   Paroxysmal atrial fibrillation with RVR (HCC) 11/09/2022   HTN (hypertension) 11/09/2022   Asthma 11/09/2022   Chest pain with moderate risk for cardiac etiology 11/09/2022   History of lobectomy of thyroid 09/20/2022   Left thyroid nodule 08/21/2022   Acute cystitis 08/17/2022   COVID-19 virus infection 04/30/2022   Paroxysmal atrial fibrillation (HCC) 03/28/2022   Empty sella syndrome (HCC) 03/28/2022   Varicose veins with inflammation 01/07/2022   S/P breast lumpectomy 10/10/2021   Cervical radiculopathy 06/28/2020   Chronic venous insufficiency 05/29/2020   Lymphedema 05/29/2020   CAD (coronary artery disease) 05/29/2020   Cervical lymphadenitis 04/18/2020  Dysuria 03/25/2020   History of repair of hiatal hernia 02/02/2020   Hypercholesteremia 06/09/2019   Chronic neuropathic pain 07/31/2018   Thyroid nodule 04/15/2018   Skin lesion of scalp 09/06/2017    Age-related osteoporosis without current pathological fracture 12/04/2016   Osteoarthritis of knee 07/17/2016   Meningioma (HCC) 01/18/2016   Family history of aortic stenosis 01/20/2015   Status post laparoscopic sleeve gastrectomy Dec 2014 06/08/2013   Class 3 severe obesity with serious comorbidity and body mass index (BMI) of 40.0 to 44.9 in adult [facility]) 12/08/2012   Primary hypertension 10/11/2011   Varicose veins of bilateral lower extremities with other complications 06/18/2011   FIBROIDS, UTERUS 04/03/2007   Asthma, mild intermittent 04/03/2007    Social History   Tobacco Use   Smoking status: Never   Smokeless tobacco: Never   Tobacco comments:    smoke at age [age]  Substance Use Topics   Alcohol use: Not Currently    Allergies  Allergen Reactions   Wasp Venom Anaphylaxis   Chlorhexidine Gluconate     Itching under breasts   Metronidazole Other (See Comments)    Chest tightness, neck tightness   Prednisone Other (See Comments)    Severe migraine / with taper dose Told by Cardiologist to avoid due to Afib   Other Rash    Bleach   Sodium Hypochlorite Rash    Liquid clorox bleach    Current Meds  Medication Sig   apixaban (ELIQUIS) 5 MG TABS tablet Take 1 tablet (5 mg total) by mouth 2 (two) times daily.   Calcium Citrate-Vitamin D (CITRACAL + D PO) Take 2 tablets by mouth daily.    Coenzyme Q10-Vitamin E (QUNOL ULTRA COQ10) 100-150 MG-UNIT CAPS Take 100 mg by mouth daily.   Cyanocobalamin (VITAMIN B-12) 2500 MCG SUBL Place 2,500 mcg under the tongue daily.   diltiazem (CARDIZEM) 30 MG tablet Take 30 mg by mouth 2 (two) times daily as needed.   DULoxetine (CYMBALTA) 30 MG capsule Take 30 mg by mouth daily.   esomeprazole (NEXIUM) 40 MG capsule Take 40 mg by mouth daily.   furosemide (LASIX) 20 MG tablet Take 1 tablet (20 mg total) by mouth daily as needed.   Lactobacillus Rhamnosus, GG, (CULTURELLE IMMUNITY SUPPORT PO) Take 1 capsule by mouth daily.    levalbuterol (XOPENEX HFA) 45 MCG/ACT inhaler Inhale 2 puffs into the lungs every 6 (six) hours as needed for wheezing.   Menaquinone-7 (VITAMIN K2 PO) Take 100 mg by mouth daily.   methocarbamol (ROBAXIN) 500 MG tablet Take 500 mg by mouth at bedtime as needed for muscle spasms.   Multiple Vitamins-Minerals (CENTRUM SILVER 50+WOMEN PO) Take 1 tablet by mouth daily.   nitroGLYCERIN (NITROSTAT) 0.4 MG SL tablet Place 1 tablet (0.4 mg total) under the tongue every 5 (five) minutes as needed for chest pain.   Riboflavin 400 MG CAPS Take 400 mg by mouth daily.   rosuvastatin (CRESTOR) 10 MG tablet Take 1 tablet (10 mg total) by mouth daily.   spironolactone (ALDACTONE) 25 MG tablet Take 1 tablet (25 mg total) by mouth daily.   traMADol (ULTRAM) 50 MG tablet Take 1 tablet (50 mg total) by mouth every 6 (six) hours as needed.   TURMERIC PO Take 2 tablets by mouth daily. 500 mg  With Ginger 50 mg Gummies   verapamil (CALAN-SR) 120 MG CR tablet Take 1 tablet (120 mg total) by mouth 2 (two) times daily.   [DISCONTINUED] metFORMIN (GLUCOPHAGE-XR) 500 MG 24 hr tablet  Take 1 tablet (500 mg total) by mouth 2 (two) times daily with a meal.   [DISCONTINUED] montelukast (SINGULAIR) 10 MG tablet Take 1 tablet (10 mg total) by mouth daily.    Immunization History  Administered Date(s) Administered   Fluad Quad(high Dose 65+) 03/09/2021, 05/12/2022   Fluad Trivalent(High Dose 65+) 03/29/2023   Influenza Inj Mdck Quad Pf 02/13/2018   Influenza Whole 06/11/2005, 04/08/2009   Influenza,inj,Quad PF,6+ Mos 03/22/2017, 02/13/2018, 03/03/2019, 04/08/2020   Influenza-Unspecified 03/27/2016   PFIZER(Purple Top)SARS-COV-2 Vaccination 08/07/2019, 08/29/2019   PNEUMOCOCCAL CONJUGATE-20 03/09/2021   Pneumococcal Polysaccharide-23 03/22/2017   Td 06/11/2004   Tdap 01/20/2015        Objective:     BP 126/86 (BP Location: Left Arm, Patient Position: Sitting, Cuff Size: Normal)   Pulse (!) 59   Temp 97.7 F (36.5  C) (Temporal)   Ht 5\' 6"  (1.676 m)   Wt 264 lb 12.8 oz (120.1 kg)   SpO2 97%   BMI 42.74 kg/m   SpO2: 97 %  GENERAL: Obese woman, no acute distress, ambulatory with assistance of a cane.  No conversational dyspnea. HEAD: Normocephalic, atraumatic.  EYES: Pupils equal, round, reactive to light.  No scleral icterus.  MOUTH: Natural dentition, oral mucosa moist, no thrush. NECK: Supple.  Thyromegaly present particularly left lobe. Trachea midline. No JVD.  No adenopathy. PULMONARY: Good air entry bilaterally.  No adventitious sounds. CARDIOVASCULAR: S1 and S2. Regular rate and rhythm.  No rubs, murmurs or gallops heard. ABDOMEN: Obese otherwise benign. MUSCULOSKELETAL: No joint deformity, no clubbing, no edema. NEUROLOGIC: No focal deficit, no gait disturbance, speech is fluent. SKIN: Intact,warm,dry.  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  Chronic stasis changes. PSYCH: Mood and behavior normal.     Lab Results  Component Value Date   NITRICOXIDE 13 09/04/2023  *No evidence of type II inflammation.   Assessment & Plan:     ICD-10-CM   1. Mild intermittent asthma without complication  J45.20 Nitric oxide    2. Paroxysmal atrial fibrillation with RVR (HCC)  I48.0     3. Lipedema of lower extremity  R60.0     4. Arthralgia, unspecified joint  M25.50       Orders Placed This Encounter  Procedures   Nitric oxide   Discussion:    Mild intermittent asthma She reports well-managed respiratory symptoms and has not used her rescue inhaler recently. She is currently on montelukast but is considering discontinuation due to polypharmacy concerns. Montelukast is not favored for long-term use in her case, and over-the-counter options are available for allergy management. - Discontinue montelukast - Continue rescue inhaler as needed  Atrial fibrillation She has been on anticoagulants for over a year without any AFib episodes and experiences low heart rate at night. She is  considering discussing with her cardiologist the possibility of discontinuing anticoagulation therapy. - Discuss with cardiologist about discontinuing anticoagulation therapy  Osteoporosis She has osteoporosis and has experienced severe reactions to Prolia and Reclast, leading to emergency room visits. She is not currently on osteoporosis injections due to these reactions. Steroids are avoided due to potential AFib risk. - Avoid Prolia and Reclast - Avoid steroids  Lipedema She presents with disproportionate fat deposition in her legs, previously misdiagnosed as lymphedema. She experiences achy and heavy legs and has tried various treatments without success. Lymphatic drainage massage and dietary changes may help manage symptoms. - Refer to lipedema foundation for further information and support - Consider lymphatic drainage massage - Adopt a Mediterranean  diet  Joint pain She experiences achy joints, particularly in her hands, with swelling noted on her thumbs. She has been started on duloxetine by a rheumatologist for pain management. X-ray results of her hands are pending. She is concerned about polypharmacy and is considering reducing medications. - Discuss with rheumatologist about the necessity of continuing duloxetine - Await results of hand x-rays      Advised if symptoms do not improve or worsen, to please contact office for sooner follow up or seek emergency care.    I spent 40 minutes of dedicated to the care of this patient on the date of this encounter to include pre-visit review of records, face-to-face time with the patient discussing conditions above, post visit ordering of testing, clinical documentation with the electronic health record, making appropriate referrals as documented, and communicating necessary findings to members of the patients care team.     C. Danice Goltz, MD Advanced Bronchoscopy PCCM [facility]    *This note was generated  using voice recognition software/Dragon and/or AI transcription program.  Despite best efforts to proofread, errors can occur which can change the meaning. Any transcriptional errors that result from this process are unintentional and may not be fully corrected at the time of dictation.

## 2023-09-11 ENCOUNTER — Ambulatory Visit
Admission: RE | Admit: 2023-09-11 | Discharge: 2023-09-11 | Disposition: A | Discharge status: Home / Self Care | Payer: HMO | Source: Ambulatory Visit | Attending: Family Medicine | Admitting: Family Medicine

## 2023-09-11 ENCOUNTER — Other Ambulatory Visit: Payer: Self-pay | Admitting: Family Medicine

## 2023-09-11 ENCOUNTER — Encounter: Payer: Self-pay | Admitting: Family Medicine

## 2023-09-11 ENCOUNTER — Encounter: Payer: Self-pay | Admitting: Cardiology

## 2023-09-11 DIAGNOSIS — Z780 Asymptomatic menopausal state: Secondary | ICD-10-CM | POA: Diagnosis not present

## 2023-09-11 DIAGNOSIS — M810 Age-related osteoporosis without current pathological fracture: Secondary | ICD-10-CM | POA: Diagnosis not present

## 2023-09-12 ENCOUNTER — Encounter: Payer: Self-pay | Admitting: Family Medicine

## 2023-09-12 DIAGNOSIS — M810 Age-related osteoporosis without current pathological fracture: Secondary | ICD-10-CM

## 2023-09-12 MED ORDER — VITAMIN D (ERGOCALCIFEROL) 1.25 MG (50000 UNIT) PO CAPS: 50000.0000 [IU] | ORAL_CAPSULE | ORAL | 1 refills | Status: DC

## 2023-09-16 ENCOUNTER — Encounter (INDEPENDENT_AMBULATORY_CARE_PROVIDER_SITE_OTHER): Payer: Self-pay | Admitting: Internal Medicine

## 2023-09-16 ENCOUNTER — Ambulatory Visit (INDEPENDENT_AMBULATORY_CARE_PROVIDER_SITE_OTHER): Payer: HMO | Admitting: Internal Medicine

## 2023-09-16 ENCOUNTER — Encounter: Payer: Self-pay | Admitting: Family Medicine

## 2023-09-16 VITALS — BP 124/81 | HR 62 | Temp 97.8°F | Ht 66.0 in | Wt 260.0 lb

## 2023-09-16 DIAGNOSIS — M1711 Unilateral primary osteoarthritis, right knee: Secondary | ICD-10-CM

## 2023-09-16 DIAGNOSIS — Z9884 Bariatric surgery status: Secondary | ICD-10-CM | POA: Diagnosis not present

## 2023-09-16 DIAGNOSIS — E66813 Obesity, class 3: Secondary | ICD-10-CM

## 2023-09-16 DIAGNOSIS — I4891 Unspecified atrial fibrillation: Secondary | ICD-10-CM | POA: Diagnosis not present

## 2023-09-16 DIAGNOSIS — I890 Lymphedema, not elsewhere classified: Secondary | ICD-10-CM

## 2023-09-16 DIAGNOSIS — R7303 Prediabetes: Secondary | ICD-10-CM | POA: Diagnosis not present

## 2023-09-16 DIAGNOSIS — Z6841 Body Mass Index (BMI) 40.0 and over, adult: Secondary | ICD-10-CM | POA: Diagnosis not present

## 2023-09-16 MED ORDER — METFORMIN HCL ER 500 MG PO TB24: 500.0000 mg | ORAL_TABLET | Freq: Two times a day (BID) | ORAL | 0 refills | Status: DC

## 2023-09-16 NOTE — Progress Notes (Unsigned)
 Office: [phone]  /  Fax: [phone]  Weight Summary And Biometrics  Vitals Temp: 97.8 F (36.6 C) BP: 124/81 Pulse Rate: 62 SpO2: 93 %   Anthropometric Measurements Height: 5\' 6"  (1.676 m) Weight: 260 lb (117.9 kg) BMI (Calculated): 41.99 Weight at Last Visit: 266 lb Weight Lost Since Last Visit: 6 lb Weight Gained Since Last Visit: 0 Starting Weight: 275 lb Total Weight Loss (lbs): 15 lb (6.804 kg) Peak Weight: 320 lb   Body Composition  Body Fat %: 53.3 % Fat Mass (lbs): 138.6 lbs Muscle Mass (lbs): 115.2 lbs Total Body Water (lbs): 87.4 lbs Visceral Fat Rating : 18    No data recorded Today's Visit #: 10  Starting Date: 01/08/23   Subjective   Chief Complaint: Obesity  Interval History Discussed the use of AI scribe software for clinical note transcription with the patient, who gave verbal consent to proceed.  History of Present Illness The patient presents for weight management and knee pain.  She has lost six pounds over the past four weeks, attributing this to increased attention to her diet, particularly protein intake, and maintaining a caloric intake of 1200 to 1500 calories per day. Her diet includes Cheerios with protein shakes, bananas, blueberries, salads, and occasionally [place] corn salad. She avoids high cholesterol foods due to previous high cholesterol levels. Despite reduced physical activity due to knee issues, she has managed to lose weight.  She has experienced decreased physical activity over the past four weeks due to knee pain, which she describes as a result of a torn meniscus and IT band issues. She has been using a cane and elevating her leg, and she utilizes inflatable devices for her lymphedema, which she believes aids in her knee's recovery. She is cautious about putting full weight on her leg and avoids activities that might exacerbate the condition.  She has a history of atrial fibrillation, which she associates with  steroid use. She has not experienced an episode in over a year. She is currently on blood thinners and wants to discontinue them, believing they are no longer necessary.  She is managing her prediabetes through dietary modifications, focusing on reducing carbohydrate intake and increasing protein. Her A1c was 5.6 in December, indicating good control.  She takes metformin twice daily without issues, although she notes the pill has an unusual smell. She also mentions being on a water pill and experiencing dehydration symptoms, which she manages by increasing her water intake. She underwent a bone density test, which showed osteopenia in her spine but stable hips. She has a history of inflammation and was evaluated by a rheumatologist, who ruled out rheumatoid arthritis.    Challenges affecting patient progress: low volume of physical activity at present  and orthopedic problems, medical conditions or chronic pain affecting mobility.    Pharmacotherapy for weight management: She is currently taking Metformin (off label use for incretin effect and / or insulin resistance and / or diabetes prevention) with adequate clinical response  and without side effects..   Assessment and Plan   Treatment Plan For Obesity:  Recommended Dietary Goals  Nafisa is currently in the action stage of change. As such, her goal is to continue weight management plan. She has agreed to: continue current plan  Behavioral Health and Counseling  We discussed the following behavioral modification strategies today: continue to work on maintaining a reduced calorie state, getting the recommended amount of protein, incorporating whole foods, making healthy choices, staying well hydrated and practicing mindfulness  when eating..  Additional education and resources provided today: None  Recommended Physical Activity Goals  Orva has been advised to work up to 150 minutes of moderate intensity aerobic activity a week and  strengthening exercises 2-3 times per week for cardiovascular health, weight loss maintenance and preservation of muscle mass.   She has agreed to :  Think about enjoyable ways to increase daily physical activity and overcoming barriers to exercise and Increase physical activity in their day and reduce sedentary time (increase NEAT).  Pharmacotherapy  We discussed various medication options to help Rosamary with her weight loss efforts and we both agreed to : adequate clinical response to current dose, continue current regimen  Associated Conditions Impacted by Obesity Treatment  Primary osteoarthritis of right knee  Prediabetes -     metFORMIN HCl ER; Take 1 tablet (500 mg total) by mouth 2 (two) times daily with a meal.  Dispense: 180 tablet; Refill: 0  Class 3 severe obesity with serious comorbidity and body mass index (BMI) of 40.0 to 44.9 in adult, unspecified obesity type [facility])  Status post laparoscopic sleeve gastrectomy Dec 2014    Assessment and Plan Assessment & Plan Knee Pain with IT Band and Meniscus Involvement Knee pain persists with IT band and meniscus issues. She uses a cane and elevates her leg. Steroids are avoided due to previous adverse effects. Aqua therapy is suggested to improve knee function without exacerbating the injury. - Consider aqua therapy for low-impact exercise to improve knee function. - Avoid high-impact activities until knee stability improves.  Weight Management /history of gastric sleeve She has lost six pounds over four weeks, maintaining weight loss through dietary adjustments despite reduced physical activity due to knee issues. She monitors caloric intake (1200-1500 calories/day), focuses on protein, and practices portion control. She is mindful of carbohydrates due to prediabetes. - Continue current dietary regimen focusing on protein intake and portion control. - Encourage resumption of physical activity as tolerated, considering aqua  therapy for low-impact exercise.  Prediabetes Prediabetes is managed through dietary modifications, focusing on reducing carbohydrates and increasing protein. A1c was 5.6 in December, indicating good control.  She is also on metformin for pharmacoprophylaxis without any adverse effects. - Continue dietary management focusing on low carbohydrate intake and adequate protein consumption. - Continue metformin at current dose  Atrial Fibrillation (AFib) AFib is associated with steroid use, with no episodes for over a year. She is on Eliquis and considering discussing discontinuation with her cardiologist due to the absence of recent episodes. She is aware of the increased bleeding risk with concurrent use of Eliquis and duloxetine. - Patient advised to discuss her desire to stop anticoagulation with cardiologist   Lymphedema Lymphedema is managed with inflatable devices, which also aid in knee recovery by improving circulation. - Continue use of inflatable devices for lymphedema management.   General Health Maintenance She manages her health through dietary changes and hydration, especially while on diuretics. Dehydration symptoms resolved with increased fluid intake. Sodium intake is managed to prevent fluid retention. - Encourage adequate hydration, especially while on diuretics. - Continue monitoring and managing sodium intake to prevent fluid retention.  Follow-up Follow-up is scheduled in one month to assess progress and adjust management as needed. - Schedule follow-up appointment in one month. - Send refill for metformin to [facility] in [place].      Objective   Physical Exam:  Blood pressure 124/81, pulse 62, temperature 97.8 F (36.6 C), height 5\' 6"  (1.676 m), weight 260 lb (117.9  kg), SpO2 93%. Body mass index is 41.97 kg/m.  General: She is overweight, cooperative, alert, well developed, and in no acute distress. PSYCH: Has normal mood, affect and thought process.   HEENT:  EOMI, sclerae are anicteric. Lungs: Normal breathing effort, no conversational dyspnea. Extremities: No edema.  Neurologic: No gross sensory or motor deficits. No tremors or fasciculations noted.    Diagnostic Data Reviewed:  BMET    Component Value Date/Time   NA 143 08/26/2023 0819   K 4.0 08/26/2023 0819   CL 101 08/26/2023 0819   CO2 24 08/26/2023 0819   GLUCOSE 78 08/26/2023 0819   GLUCOSE 83 05/15/2023 0721   BUN 27 08/26/2023 0819   CREATININE 0.63 08/26/2023 0819   CREATININE 0.57 01/01/2020 1623   CALCIUM 9.9 08/26/2023 0819   GFRNONAA >60 11/12/2022 0322   GFRAA >60 02/02/2020 1417   Lab Results  Component Value Date   HGBA1C 5.6 05/15/2023   HGBA1C  12/03/2007    5.5 (NOTE)   The ADA recommends the following therapeutic goal for glycemic   control related to Hgb A1C measurement:   Goal of Therapy:   < 7.0% Hgb A1C   Reference: American Diabetes Association: Clinical Practice   Recommendations 2008, Diabetes Care,  2008, 31:(Suppl 1).   Lab Results  Component Value Date   INSULIN 11.9 01/08/2023   Lab Results  Component Value Date   TSH 1.69 05/15/2023   CBC    Component Value Date/Time   WBC 6.2 07/29/2023 1033   WBC 6.7 05/15/2023 0721   RBC 4.55 07/29/2023 1033   HGB 13.4 07/29/2023 1033   HCT 40.0 07/29/2023 1033   PLT 237 07/29/2023 1033   MCV 87.9 07/29/2023 1033   MCH 29.5 07/29/2023 1033   MCHC 33.5 07/29/2023 1033   RDW 16.7 (H) 07/29/2023 1033   Iron Studies    Component Value Date/Time   IRON 69 07/29/2023 1033   TIBC 428 07/29/2023 1033   FERRITIN 64 07/29/2023 1033   IRONPCTSAT 16 07/29/2023 1033   IRONPCTSAT 5 (L) 05/28/2017 1647   Lipid Panel     Component Value Date/Time   CHOL 161 05/15/2023 0721   CHOL 168 07/31/2021 0905   TRIG 124.0 05/15/2023 0721   HDL 56.20 05/15/2023 0721   HDL 67 07/31/2021 0905   CHOLHDL 3 05/15/2023 0721   VLDL 24.8 05/15/2023 0721   LDLCALC 80 05/15/2023 0721   LDLCALC 85 07/31/2021 0905    Hepatic Function Panel     Component Value Date/Time   PROT 7.2 05/15/2023 0721   ALBUMIN 4.3 05/15/2023 0721   AST 16 05/15/2023 0721   ALT 14 05/15/2023 0721   ALKPHOS 81 05/15/2023 0721   BILITOT 0.5 05/15/2023 0721   BILIDIR 0.1 12/11/2019 0852   IBILI 0.6 12/11/2019 0852      Component Value Date/Time   TSH 1.69 05/15/2023 0721   Nutritional Lab Results  Component Value Date   VD25OH 54.40 03/14/2022   VD25OH 33.36 09/30/2017   VD25OH 32.96 06/12/2017    Medications: Outpatient Encounter Medications as of 09/16/2023  Medication Sig   apixaban (ELIQUIS) 5 MG TABS tablet Take 1 tablet (5 mg total) by mouth 2 (two) times daily.   Calcium Citrate-Vitamin D (CITRACAL + D PO) Take 2 tablets by mouth daily.    Coenzyme Q10-Vitamin E (QUNOL ULTRA COQ10) 100-150 MG-UNIT CAPS Take 100 mg by mouth daily.   Cyanocobalamin (VITAMIN B-12) 2500 MCG SUBL Place 2,500 mcg under the tongue  daily.   diltiazem (CARDIZEM) 30 MG tablet Take 30 mg by mouth 2 (two) times daily as needed.   DULoxetine (CYMBALTA) 30 MG capsule Take 30 mg by mouth daily.   esomeprazole (NEXIUM) 40 MG capsule Take 40 mg by mouth daily.   furosemide (LASIX) 20 MG tablet Take 1 tablet (20 mg total) by mouth daily as needed.   Lactobacillus Rhamnosus, GG, (CULTURELLE IMMUNITY SUPPORT PO) Take 1 capsule by mouth daily.   levalbuterol (XOPENEX HFA) 45 MCG/ACT inhaler Inhale 2 puffs into the lungs every 6 (six) hours as needed for wheezing.   Menaquinone-7 (VITAMIN K2 PO) Take 100 mg by mouth daily.   methocarbamol (ROBAXIN) 500 MG tablet Take 500 mg by mouth at bedtime as needed for muscle spasms.   Multiple Vitamins-Minerals (CENTRUM SILVER 50+WOMEN PO) Take 1 tablet by mouth daily.   nitroGLYCERIN (NITROSTAT) 0.4 MG SL tablet Place 1 tablet (0.4 mg total) under the tongue every 5 (five) minutes as needed for chest pain.   Riboflavin 400 MG CAPS Take 400 mg by mouth daily.   rosuvastatin (CRESTOR) 10 MG tablet Take 1  tablet (10 mg total) by mouth daily.   spironolactone (ALDACTONE) 25 MG tablet Take 1 tablet (25 mg total) by mouth daily.   traMADol (ULTRAM) 50 MG tablet Take 1 tablet (50 mg total) by mouth every 6 (six) hours as needed.   TURMERIC PO Take 2 tablets by mouth daily. 500 mg  With Ginger 50 mg Gummies   verapamil (CALAN-SR) 120 MG CR tablet Take 1 tablet (120 mg total) by mouth 2 (two) times daily.   [DISCONTINUED] metFORMIN (GLUCOPHAGE-XR) 500 MG 24 hr tablet Take 1 tablet (500 mg total) by mouth 2 (two) times daily with a meal.   [DISCONTINUED] Vitamin D, Ergocalciferol, (DRISDOL) 1.25 MG (50000 UNIT) CAPS capsule Take 1 capsule (50,000 Units total) by mouth every 7 (seven) days.   metFORMIN (GLUCOPHAGE-XR) 500 MG 24 hr tablet Take 1 tablet (500 mg total) by mouth 2 (two) times daily with a meal.   [DISCONTINUED] gabapentin (NEURONTIN) 100 MG capsule Take 100 mg by mouth daily. (Patient not taking: Reported on 09/04/2023)   [DISCONTINUED] montelukast (SINGULAIR) 10 MG tablet Take 1 tablet (10 mg total) by mouth daily.   Facility-Administered Encounter Medications as of 09/16/2023  Medication   albuterol (PROVENTIL) (2.5 MG/3ML) 0.083% nebulizer solution 2.5 mg     Follow-Up   Return in about 4 weeks (around 10/14/2023) for For Weight Mangement with Dr. Rikki Spearing.Marland Kitchen She was informed of the importance of frequent follow up visits to maximize her success with intensive lifestyle modifications for her multiple health conditions.  Attestation Statement   Reviewed by clinician on day of visit: allergies, medications, problem list, medical history, surgical history, family history, social history, and previous encounter notes.     Worthy Rancher, MD

## 2023-09-17 ENCOUNTER — Other Ambulatory Visit: Payer: Self-pay | Admitting: Family Medicine

## 2023-09-17 ENCOUNTER — Encounter: Payer: Self-pay | Admitting: Pulmonary Disease

## 2023-09-17 MED ORDER — VITAMIN D3 1.25 MG (50000 UT) PO CAPS: 1.0000 | ORAL_CAPSULE | ORAL | 0 refills | Status: DC

## 2023-09-24 ENCOUNTER — Encounter: Payer: Self-pay | Admitting: Family Medicine

## 2023-09-24 ENCOUNTER — Ambulatory Visit
Admission: EM | Admit: 2023-09-24 | Discharge: 2023-09-24 | Disposition: A | Discharge status: Home / Self Care | Attending: Emergency Medicine | Admitting: Emergency Medicine

## 2023-09-24 DIAGNOSIS — R350 Frequency of micturition: Secondary | ICD-10-CM

## 2023-09-24 DIAGNOSIS — M545 Low back pain, unspecified: Secondary | ICD-10-CM

## 2023-09-24 LAB — POCT URINALYSIS DIP (MANUAL ENTRY)
Bilirubin, UA: NEGATIVE
Blood, UA: NEGATIVE
Glucose, UA: NEGATIVE mg/dL
Leukocytes, UA: NEGATIVE
Nitrite, UA: NEGATIVE
Spec Grav, UA: 1.02 (ref 1.010–1.025)
Urobilinogen, UA: 0.2 U/dL
pH, UA: 5.5 (ref 5.0–8.0)

## 2023-09-24 NOTE — ED Provider Notes (Signed)
 Kimberly Patton    CSN: [id number] Arrival date & time: 09/24/23  6213      History   Chief Complaint Chief Complaint  Patient presents with   Back Pain    HPI Kimberly Patton is a 68 y.o. female.  Patient presents with right lower back pain x 1 week after working in her yard.  She also has had urinary frequency so she stopped taking her Lasix 2 weeks ago.  She is concerned for possible kidney or urinary infection.  She denies fever, chills, abdominal pain, dysuria, hematuria, numbness, weakness, paresthesias, saddle anesthesia, loss of bowel or bladder control, rash, bruising, wounds.  She has been treating her symptoms with Tylenol and methocarbamol that was previously prescribed by her doctor.  The history is provided by the patient and medical records.    Past Medical History:  Diagnosis Date   ALLERGIC RHINITIS 04/03/2007   Allergy    Not sure   Anemia    ((Pt Qnr Sub: Denies at visit from 08/28/2021 with endocrinology))    Anginal pain (HCC)    Anxiety    Arthritis Not sure   Asthma    hx of years ago - no longer a problem    ASTHMA, PERSISTENT, MODERATE 04/03/2007   Back pain    Chest pain    Depression screen 01/08/2023   Dyspnea    with exertion   Dyspnea on exertion    a. 11/2007 Echo: EF 60%.   Dysrhythmia    hx of heart arrhythmia 10-15 years ago - followed by DR Carolynne Citron - not seen in years    Empty sella syndrome (HCC)    GERD (gastroesophageal reflux disease)    Headache    Hemophilia carrier    Hiatal hernia    History of kidney stones    Hypercholesteremia 06/09/2019   Hypertension    Joint pain    HIPS / LEGS   Joint pain    Knee injury    RT   Lymph edema    bilateral legs   Meningioma (HCC)    Morbid obesity (HCC)    Neuromuscular disorder (HCC) Not sure, since my fall   Occipital neuralgia    Osteoarthritis    Palpitations    Paroxysmal SVT (supraventricular tachycardia) (HCC)    a. 11/2011 48h Holter: RSR, rare PVC's, occas  PAC's   Retinal tear of right eye 01/2016   Skin cancer    SOB (shortness of breath)    Thyroid nodule 04/15/2018   Ventral hernia     Patient Active Problem List   Diagnosis Date Noted   Sebaceous cyst of labia 06/27/2023   Vaginal irritation 06/27/2023   Abnormal food appetite 06/26/2023   Iron deficiency 05/23/2023   Dizziness 01/29/2023   Prediabetes 01/08/2023   Abnormal metabolism 01/08/2023   Paroxysmal atrial fibrillation with RVR (HCC) 11/09/2022   HTN (hypertension) 11/09/2022   Asthma 11/09/2022   Chest pain with moderate risk for cardiac etiology 11/09/2022   History of lobectomy of thyroid 09/20/2022   Left thyroid nodule 08/21/2022   Acute cystitis 08/17/2022   COVID-19 virus infection 04/30/2022   Paroxysmal atrial fibrillation (HCC) 03/28/2022   Empty sella syndrome (HCC) 03/28/2022   Varicose veins with inflammation 01/07/2022   S/P breast lumpectomy 10/10/2021   Cervical radiculopathy 06/28/2020   Chronic venous insufficiency 05/29/2020   Lymphedema 05/29/2020   CAD (coronary artery disease) 05/29/2020   Cervical lymphadenitis 04/18/2020   Dysuria 03/25/2020   History of  repair of hiatal hernia 02/02/2020   Hypercholesteremia 06/09/2019   Chronic neuropathic pain 07/31/2018   Thyroid nodule 04/15/2018   Skin lesion of scalp 09/06/2017   Age-related osteoporosis without current pathological fracture 12/04/2016   Osteoarthritis of knee 07/17/2016   Meningioma (HCC) 01/18/2016   Family history of aortic stenosis 01/20/2015   Status post laparoscopic sleeve gastrectomy Dec 2014 06/08/2013   Class 3 severe obesity with serious comorbidity and body mass index (BMI) of 40.0 to 44.9 in adult (HCC) 12/08/2012   Primary hypertension 10/11/2011   Varicose veins of bilateral lower extremities with other complications 06/18/2011   FIBROIDS, UTERUS 04/03/2007   Asthma, mild intermittent 04/03/2007    Past Surgical History:  Procedure Laterality Date    ABDOMINAL HYSTERECTOMY  Think 1994   APPENDECTOMY  1996   ARTERY BIOPSY Left 03/21/2022   Procedure: BIOPSY TEMPORAL ARTERY;  Surgeon: Renford Dills, MD;  Location: [facility];  Service: Vascular;  Laterality: Left;   BIOPSY THYROID     08/2021   BREAST CYST EXCISION Right 1994   Benign   CARDIOVERSION N/A 11/12/2022   Procedure: CARDIOVERSION;  Surgeon: Antonieta Iba, MD;  Location: [facility];  Service: Cardiovascular;  Laterality: N/A;   CESAREAN SECTION     x2   CHOLECYSTECTOMY  1995   COLONOSCOPY WITH PROPOFOL N/A 05/09/2021   Procedure: COLONOSCOPY WITH PROPOFOL;  Surgeon: Wyline Mood, MD;  Location: [facility];  Service: Gastroenterology;  Laterality: N/A;   ESOPHAGOGASTRODUODENOSCOPY (EGD) WITH PROPOFOL N/A 11/02/2022   Procedure: ESOPHAGOGASTRODUODENOSCOPY (EGD) WITH PROPOFOL;  Surgeon: Regis Bill, MD;  Location: [facility];  Service: Endoscopy;  Laterality: N/A;   EYE SURGERY  01/2016   Repair retinal tear   FOOT SURGERY  1995 / 1996   x2   HERNIA REPAIR     KNEE ARTHROSCOPY W/ MENISCAL REPAIR  07/25/2013   right knee Dr. Dion Saucier   LAPAROSCOPIC GASTRIC SLEEVE RESECTION N/A 06/08/2013   Procedure: LAPAROSCOPIC GASTRIC SLEEVE RESECTION AND EXCISION OF SEBACEUS CYST FROM MID CHEST takedown of incarcerated ventral hernia and primary repair endoscopy;  Surgeon: Valarie Merino, MD;  Location: [facility];  Service: General;  Laterality: N/A;   LAPAROSCOPIC NISSEN FUNDOPLICATION N/A 02/02/2020   Procedure: LAPAROSCOPIC REPAIR LARGE SYMPTOMATIC HIATAL HERNIA WITH UPPER ENDOSCOPY;  Surgeon: Luretha Murphy, MD;  Location: [facility];  Service: General;  Laterality: N/A;   moles  06/2013   removed 2 moles from under arm and  lowback   NECK SURGERY     occipital nerve damage- injections    OVARIAN CYST REMOVAL  1970   PILONIDAL CYST EXCISION  1975   RADIOACTIVE SEED GUIDED EXCISIONAL BREAST BIOPSY Right 06/23/2021   Procedure: RADIOACTIVE SEED GUIDED EXCISIONAL RIGHT  BREAST BIOPSY;  Surgeon: Luretha Murphy, MD;  Location: [facility];  Service: General;  Laterality: Right;   RETINAL TEAR REPAIR CRYOTHERAPY Right 01/2016   [place]   SKIN CANCER EXCISION     TEE WITHOUT CARDIOVERSION N/A 11/12/2022   Procedure: TRANSESOPHAGEAL ECHOCARDIOGRAM;  Surgeon: Antonieta Iba, MD;  Location: [facility];  Service: Cardiovascular;  Laterality: N/A;   THYROID LOBECTOMY Left 08/21/2022   Procedure: LEFT THYROID LOBECTOMY;  Surgeon: Darnell Level, MD;  Location: [facility];  Service: General;  Laterality: Left;   THYROIDECTOMY, PARTIAL     TONSILLECTOMY     TUBAL LIGATION  Think 1994   UPPER GI ENDOSCOPY  06/08/2013   Procedure: UPPER GI ENDOSCOPY;  Surgeon: Valarie Merino, MD;  Location: [facility];  Service: General;;   US ECHOCARDIOGRAPHY  12/2011   WNL - EF 55-60%, mild MR, grade 1 diastolic dysnfiction (mild)   VENTRAL HERNIA REPAIR  2000    OB History     Gravida  3   Para  2   Term  2   Preterm      AB  1   Living  2      SAB  1   IAB      Ectopic      Multiple      Live Births  2            Home Medications    Prior to Admission medications   Medication Sig Start Date End Date Taking? Authorizing Provider  apixaban (ELIQUIS) 5 MG TABS tablet Take 1 tablet (5 mg total) by mouth 2 (two) times daily. 12/17/22   Debbe Odea, MD  Calcium Citrate-Vitamin D (CITRACAL + D PO) Take 2 tablets by mouth daily.     [provider]  Cholecalciferol (VITAMIN D3) 1.25 MG (50000 UT) CAPS Take 1 capsule (1.25 mg total) by mouth once a week. 09/17/23   Bedsole, Amy E, MD  Coenzyme Q10-Vitamin E (QUNOL ULTRA COQ10) 100-150 MG-UNIT CAPS Take 100 mg by mouth daily.    [provider]  Cyanocobalamin (VITAMIN B-12) 2500 MCG SUBL Place 2,500 mcg under the tongue daily.    [provider]  diltiazem (CARDIZEM) 30 MG tablet Take 30 mg by mouth 2 (two) times daily as needed.    [provider]  DULoxetine  (CYMBALTA) 30 MG capsule Take 30 mg by mouth daily. 08/29/23 11/27/23  [provider]  esomeprazole (NEXIUM) 40 MG capsule Take 40 mg by mouth daily. 02/21/23 02/21/24  [provider]  furosemide (LASIX) 20 MG tablet Take 1 tablet (20 mg total) by mouth daily as needed. 08/15/23 11/13/23  Debbe Odea, MD  Lactobacillus Rhamnosus, GG, (CULTURELLE IMMUNITY SUPPORT PO) Take 1 capsule by mouth daily.    [provider]  levalbuterol Pauline Aus HFA) 45 MCG/ACT inhaler Inhale 2 puffs into the lungs every 6 (six) hours as needed for wheezing. 12/10/22 12/10/23  Glenford Bayley, NP  Menaquinone-7 (VITAMIN K2 PO) Take 100 mg by mouth daily.    [provider]  metFORMIN (GLUCOPHAGE-XR) 500 MG 24 hr tablet Take 1 tablet (500 mg total) by mouth 2 (two) times daily with a meal. 09/16/23   Worthy Rancher, MD  methocarbamol (ROBAXIN) 500 MG tablet Take 500 mg by mouth at bedtime as needed for muscle spasms. 06/14/22   [provider]  Multiple Vitamins-Minerals (CENTRUM SILVER 50+WOMEN PO) Take 1 tablet by mouth daily.    [provider]  nitroGLYCERIN (NITROSTAT) 0.4 MG SL tablet Place 1 tablet (0.4 mg total) under the tongue every 5 (five) minutes as needed for chest pain. 05/17/22   Debbe Odea, MD  Riboflavin 400 MG CAPS Take 400 mg by mouth daily. 07/26/21   [provider]  rosuvastatin (CRESTOR) 10 MG tablet Take 1 tablet (10 mg total) by mouth daily. 06/19/23   Debbe Odea, MD  spironolactone (ALDACTONE) 25 MG tablet Take 1 tablet (25 mg total) by mouth daily. 08/09/23 08/03/24  Debbe Odea, MD  traMADol (ULTRAM) 50 MG tablet Take 1 tablet (50 mg total) by mouth every 6 (six) hours as needed. 08/22/22   Darnell Level, MD  TURMERIC PO Take 2 tablets by mouth daily. 500 mg  With Ginger 50 mg  Gummies    [provider]  verapamil (CALAN-SR) 120 MG CR tablet Take 1 tablet (120 mg total) by mouth 2 (two) times daily. 09/24/22    Fransico Michael, Cadence H, PA-C    Family History Family History  Problem Relation Age of Onset   Breast cancer Mother    Diabetes Mother    Hypertension Mother    Kidney failure Mother    Heart disease Mother    Obesity Mother    Diabetes Father    Hypertension Father    Heart disease Father    Diabetes Other    Hypertension Other    Stroke Other    Hemophilia Other     Social History Social History   Tobacco Use   Smoking status: Never   Smokeless tobacco: Never   Tobacco comments:    smoke at age 43-12  Vaping Use   Vaping status: Never Used  Substance Use Topics   Alcohol use: Not Currently   Drug use: No     Allergies   Wasp venom, Chlorhexidine gluconate, Metronidazole, Prednisone, Other, and Sodium hypochlorite   Review of Systems Review of Systems  Constitutional:  Negative for chills and fever.  Gastrointestinal:  Negative for abdominal pain, constipation, diarrhea, nausea and vomiting.  Genitourinary:  Positive for frequency. Negative for dysuria, flank pain and hematuria.  Musculoskeletal:  Positive for back pain. Negative for gait problem.  Skin:  Negative for color change, rash and wound.  Neurological:  Negative for weakness and numbness.     Physical Exam Triage Vital Signs ED Triage Vitals  Encounter Vitals Group     BP      Systolic BP Percentile      Diastolic BP Percentile      Pulse      Resp      Temp      Temp src      SpO2      Weight      Height      Head Circumference      Peak Flow      Pain Score      Pain Loc      Pain Education      Exclude from Growth Chart    No data found.  Updated Vital Signs BP 120/82   Pulse 65   Temp 97.9 F (36.6 C)   Resp 18   SpO2 98%   Visual Acuity Right Eye Distance:   Left Eye Distance:   Bilateral Distance:    Right Eye Near:   Left Eye Near:    Bilateral Near:     Physical Exam Constitutional:      General: She is not in acute distress. HENT:     Mouth/Throat:      Mouth: Mucous membranes are moist.  Cardiovascular:     Rate and Rhythm: Normal rate and regular rhythm.  Pulmonary:     Effort: Pulmonary effort is normal. No respiratory distress.  Abdominal:     General: Bowel sounds are normal.     Palpations: Abdomen is soft.     Tenderness: There is no abdominal tenderness. There is no right CVA tenderness, left CVA tenderness, guarding or rebound.  Musculoskeletal:        General: Tenderness present. No deformity. Normal range of motion.       Back:     Comments: Right lower back muscular tenderness.   Skin:    General: Skin is warm and dry.  Findings: No bruising, erythema, lesion or rash.  Neurological:     General: No focal deficit present.     Mental Status: She is alert.     Sensory: No sensory deficit.     Motor: No weakness.     Gait: Gait normal.      UC Treatments / Results  Labs (all labs ordered are listed, but only abnormal results are displayed) Labs Reviewed  POCT URINALYSIS DIP (MANUAL ENTRY) - Abnormal; Notable for the following components:      Result Value   Color, UA other (*)    Ketones, POC UA trace (5) (*)    Protein Ur, POC trace (*)    All other components within normal limits    EKG   Radiology No results found.  Procedures Procedures (including critical care time)  Medications Ordered in UC Medications - No data to display  Initial Impression / Assessment and Plan / UC Course  I have reviewed the triage vital signs and the nursing notes.  Pertinent labs & imaging results that were available during my care of the patient were reviewed by me and considered in my medical decision making (see chart for details).   Right low back pain without sciatica.  Urinary frequency.  Urine does not indicate infection.  Afebrile and vital signs are stable.  Instructed patient to continue Tylenol as needed and methocarbamol if needed.  Precautions for drowsiness with methocarbamol reviewed.  Instructed patient  to follow-up with her PCP if she is not improving.  She agrees to plan of care.  Final Clinical Impressions(s) / UC Diagnoses   Final diagnoses:  Acute right-sided low back pain without sciatica  Urinary frequency     Discharge Instructions      Your urine does not show signs of infection at this time.  Continue taking Tylenol as needed for your back pain.  Continue the methocarbamol as previously prescribed by your doctor.  Follow-up with your primary care provider if your symptoms are not improving.     ED Prescriptions   None    I have reviewed the PDMP during this encounter.   Wellington Half, NP 09/24/23 [phone]

## 2023-09-24 NOTE — ED Triage Notes (Signed)
 Patient to Urgent Care with complaints of lower back pain (right sided). Reports she was having urinary frequency so stopped taking her diuretic. Denies any fevers.  Symptoms started last week after doing yard work. Pain started radiating into her mid back in the middle of the week.

## 2023-09-24 NOTE — Discharge Instructions (Addendum)
 Your urine does not show signs of infection at this time.  Continue taking Tylenol as needed for your back pain.  Continue the methocarbamol as previously prescribed by your doctor.  Follow-up with your primary care provider if your symptoms are not improving.

## 2023-09-26 ENCOUNTER — Encounter: Payer: Self-pay | Admitting: Cardiology

## 2023-09-26 ENCOUNTER — Ambulatory Visit: Attending: Cardiology | Admitting: Cardiology

## 2023-09-26 VITALS — BP 118/76 | HR 70 | Ht 66.0 in | Wt 262.0 lb

## 2023-09-26 DIAGNOSIS — I480 Paroxysmal atrial fibrillation: Secondary | ICD-10-CM

## 2023-09-26 DIAGNOSIS — R600 Localized edema: Secondary | ICD-10-CM | POA: Diagnosis not present

## 2023-09-26 DIAGNOSIS — I10 Essential (primary) hypertension: Secondary | ICD-10-CM | POA: Diagnosis not present

## 2023-09-26 DIAGNOSIS — E782 Mixed hyperlipidemia: Secondary | ICD-10-CM

## 2023-09-26 NOTE — Progress Notes (Signed)
 Cardiology Office Note:    Date:  09/26/2023   ID:  Kimberly Patton, DOB 19-Aug-1955, MRN [id number]  PCP:  Excell Seltzer, MD  [facility] Cardiologist:  Debbe Odea, MD  [facility] Electrophysiologist:  Lanier Prude, MD   Referring MD: Excell Seltzer, MD   Chief Complaint  Patient presents with   Weight Gain    Patient c/o increase in weight and fluid. Patient would like to discuss coming off Eliquis.     History of Present Illness:    Kimberly Patton is a 68 y.o. female with a hx of paroxysmal atrial fibrillation, hypertension, hiatal hernia, GERD, iron deficiency anemia, SVT  presenting for follow-up.    Denies chest pain or shortness of breath, endorses easy bruisability.  A-fib occurred in the context of steroid administration.  Currently takes Eliquis 5 mg twice daily.  Denies any palpitations.  Has been exercising more frequently, has lost some weight.  Edema well-controlled with as needed Lasix.  Also has lymphedema.  Tolerating verapamil, Aldactone.  Blood pressure well-controlled.   Prior notes Cardiac monitor 04/2022 no A-fib, occasional paroxysmal SVT Echocardiogram 01/2020 showed normal systolic and diastolic function, EF 60 to 65%.  Lexiscan Myoview 01/2020 with no evidence for ischemia.  History of lone atrial fibrillation in the context of steroid administration. Has a history of hypokalemia while taking HCTZ. Losartan was started after stopping HCTZ. Developed nausea with losartan. This was stopped.   Past Medical History:  Diagnosis Date   ALLERGIC RHINITIS 04/03/2007   Allergy    Not sure   Anemia    ((Pt Qnr Sub: Denies at visit from 08/28/2021 with endocrinology))    Anginal pain (HCC)    Anxiety    Arthritis Not sure   Asthma    hx of years ago - no longer a problem    ASTHMA, PERSISTENT, MODERATE 04/03/2007   Back pain    Chest pain    Depression screen 01/08/2023   Dyspnea    with exertion   Dyspnea on exertion    a. 11/2007  Echo: EF 60%.   Dysrhythmia    hx of heart arrhythmia 10-15 years ago - followed by DR Ladona Ridgel - not seen in years    Empty sella syndrome (HCC)    GERD (gastroesophageal reflux disease)    Headache    Hemophilia carrier    Hiatal hernia    History of kidney stones    Hypercholesteremia 06/09/2019   Hypertension    Joint pain    HIPS / LEGS   Joint pain    Knee injury    RT   Lymph edema    bilateral legs   Meningioma (HCC)    Morbid obesity (HCC)    Neuromuscular disorder (HCC) Not sure, since my fall   Occipital neuralgia    Osteoarthritis    Palpitations    Paroxysmal SVT (supraventricular tachycardia) (HCC)    a. 11/2011 48h Holter: RSR, rare PVC's, occas PAC's   Retinal tear of right eye 01/2016   Skin cancer    SOB (shortness of breath)    Thyroid nodule 04/15/2018   Ventral hernia     Past Surgical History:  Procedure Laterality Date   ABDOMINAL HYSTERECTOMY  Think 1994   APPENDECTOMY  1996   ARTERY BIOPSY Left 03/21/2022   Procedure: BIOPSY TEMPORAL ARTERY;  Surgeon: Renford Dills, MD;  Location: [facility] OR[facility];  Service: Vascular;  Laterality: Left;   BIOPSY THYROID  08/2021   BREAST CYST EXCISION Right 1994   Benign   CARDIOVERSION N/A 11/12/2022   Procedure: CARDIOVERSION;  Surgeon: Antonieta Iba, MD;  Location: [facility];  Service: Cardiovascular;  Laterality: N/A;   CESAREAN SECTION     x2   CHOLECYSTECTOMY  1995   COLONOSCOPY WITH PROPOFOL N/A 05/09/2021   Procedure: COLONOSCOPY WITH PROPOFOL;  Surgeon: Wyline Mood, MD;  Location: [facility] ENDOSCOPY;  Service: Gastroenterology;  Laterality: N/A;   ESOPHAGOGASTRODUODENOSCOPY (EGD) WITH PROPOFOL N/A 11/02/2022   Procedure: ESOPHAGOGASTRODUODENOSCOPY (EGD) WITH PROPOFOL;  Surgeon: Regis Bill, MD;  Location: [facility] ENDOSCOPY;  Service: Endoscopy;  Laterality: N/A;   EYE SURGERY  01/2016   Repair retinal tear   FOOT SURGERY  1995 / 1996   x2   HERNIA REPAIR     KNEE ARTHROSCOPY W/ MENISCAL REPAIR   07/25/2013   right knee Dr. Dion Saucier   LAPAROSCOPIC GASTRIC SLEEVE RESECTION N/A 06/08/2013   Procedure: LAPAROSCOPIC GASTRIC SLEEVE RESECTION AND EXCISION OF SEBACEUS CYST FROM MID CHEST takedown of incarcerated ventral hernia and primary repair endoscopy;  Surgeon: Valarie Merino, MD;  Location: [facility];  Service: General;  Laterality: N/A;   LAPAROSCOPIC NISSEN FUNDOPLICATION N/A 02/02/2020   Procedure: LAPAROSCOPIC REPAIR LARGE SYMPTOMATIC HIATAL HERNIA WITH UPPER ENDOSCOPY;  Surgeon: Luretha Murphy, MD;  Location: [facility];  Service: General;  Laterality: N/A;   moles  06/2013   removed 2 moles from under arm and  lowback   NECK SURGERY     occipital nerve damage- injections    OVARIAN CYST REMOVAL  1970   PILONIDAL CYST EXCISION  1975   RADIOACTIVE SEED GUIDED EXCISIONAL BREAST BIOPSY Right 06/23/2021   Procedure: RADIOACTIVE SEED GUIDED EXCISIONAL RIGHT BREAST BIOPSY;  Surgeon: Luretha Murphy, MD;  Location: [facility];  Service: General;  Laterality: Right;   RETINAL TEAR REPAIR CRYOTHERAPY Right 01/2016   [place]   SKIN CANCER EXCISION     TEE WITHOUT CARDIOVERSION N/A 11/12/2022   Procedure: TRANSESOPHAGEAL ECHOCARDIOGRAM;  Surgeon: Antonieta Iba, MD;  Location: [facility];  Service: Cardiovascular;  Laterality: N/A;   THYROID LOBECTOMY Left 08/21/2022   Procedure: LEFT THYROID LOBECTOMY;  Surgeon: Darnell Level, MD;  Location: [facility];  Service: General;  Laterality: Left;   THYROIDECTOMY, PARTIAL     TONSILLECTOMY     TUBAL LIGATION  Think 1994   UPPER GI ENDOSCOPY  06/08/2013   Procedure: UPPER GI ENDOSCOPY;  Surgeon: Valarie Merino, MD;  Location: [facility];  Service: General;;   US ECHOCARDIOGRAPHY  12/2011   WNL - EF 55-60%, mild MR, grade 1 diastolic dysnfiction (mild)   VENTRAL HERNIA REPAIR  2000    Current Medications: Current Meds  Medication Sig   apixaban (ELIQUIS) 5 MG TABS tablet Take 1 tablet (5 mg total) by mouth 2 (two) times daily.   Calcium  Citrate-Vitamin D (CITRACAL + D PO) Take 2 tablets by mouth daily.    Cholecalciferol (VITAMIN D3) 1.25 MG (50000 UT) CAPS Take 1 capsule (1.25 mg total) by mouth once a week.   Coenzyme Q10-Vitamin E (QUNOL ULTRA COQ10) 100-150 MG-UNIT CAPS Take 100 mg by mouth daily.   Cyanocobalamin (VITAMIN B-12) 2500 MCG SUBL Place 2,500 mcg under the tongue daily.   diltiazem (CARDIZEM) 30 MG tablet Take 30 mg by mouth 2 (two) times daily as needed.   DULoxetine (CYMBALTA) 30 MG capsule Take 30 mg by mouth daily.   esomeprazole (NEXIUM) 40 MG capsule Take 40 mg by  mouth daily.   furosemide (LASIX) 20 MG tablet Take 1 tablet (20 mg total) by mouth daily as needed.   Lactobacillus Rhamnosus, GG, (CULTURELLE IMMUNITY SUPPORT PO) Take 1 capsule by mouth daily.   levalbuterol (XOPENEX HFA) 45 MCG/ACT inhaler Inhale 2 puffs into the lungs every 6 (six) hours as needed for wheezing.   Menaquinone-7 (VITAMIN K2 PO) Take 100 mg by mouth daily.   metFORMIN (GLUCOPHAGE-XR) 500 MG 24 hr tablet Take 1 tablet (500 mg total) by mouth 2 (two) times daily with a meal.   methocarbamol (ROBAXIN) 500 MG tablet Take 500 mg by mouth at bedtime as needed for muscle spasms.   Multiple Vitamins-Minerals (CENTRUM SILVER 50+WOMEN PO) Take 1 tablet by mouth daily.   nitroGLYCERIN (NITROSTAT) 0.4 MG SL tablet Place 1 tablet (0.4 mg total) under the tongue every 5 (five) minutes as needed for chest pain.   Riboflavin 400 MG CAPS Take 400 mg by mouth daily.   rosuvastatin (CRESTOR) 10 MG tablet Take 1 tablet (10 mg total) by mouth daily.   spironolactone (ALDACTONE) 25 MG tablet Take 1 tablet (25 mg total) by mouth daily.   traMADol (ULTRAM) 50 MG tablet Take 1 tablet (50 mg total) by mouth every 6 (six) hours as needed.   TURMERIC PO Take 2 tablets by mouth daily. 500 mg  With Ginger 50 mg Gummies   verapamil (CALAN-SR) 120 MG CR tablet Take 1 tablet (120 mg total) by mouth 2 (two) times daily.     Allergies:   Wasp venom,  Chlorhexidine gluconate, Metronidazole, Prednisone, Other, and Sodium hypochlorite   Social History   Socioeconomic History   Marital status: Divorced    Spouse name: Not on file   Number of children: 2   Years of education: 17   Highest education level: Bachelor's degree (e.g., BA, AB, BS)  Occupational History   Occupation: Magazine features editor: [employer]   Occupation: Retired Runner, broadcasting/film/video  Tobacco Use   Smoking status: Never   Smokeless tobacco: Never   Tobacco comments:    smoke at age 5-12  Vaping Use   Vaping status: Never Used  Substance and Sexual Activity   Alcohol use: Not Currently   Drug use: No   Sexual activity: Not Currently    Partners: Male    Birth control/protection: Abstinence, Pill  Other Topics Concern   Not on file  Social History Narrative   Lives at home alone.   Right-handed.   Occasional caffeine use.   Social Drivers of Corporate investment banker Strain: Low Risk  (06/26/2023)   Overall Financial Resource Strain (CARDIA)    Difficulty of Paying Living Expenses: Not hard at all  Food Insecurity: No Food Insecurity (06/26/2023)   Hunger Vital Sign    Worried About Running Out of Food in the Last Year: Never true    Ran Out of Food in the Last Year: Never true  Transportation Needs: No Transportation Needs (06/26/2023)   PRAPARE - Administrator, Civil Service (Medical): No    Lack of Transportation (Non-Medical): No  Physical Activity: Insufficiently Active (06/26/2023)   Exercise Vital Sign    Days of Exercise per Week: 1 day    Minutes of Exercise per Session: 10 min  Stress: No Stress Concern Present (06/26/2023)   [employer] of Occupational Health - Occupational Stress Questionnaire    Feeling of Stress : Not at all  Social Connections: Moderately Integrated (06/26/2023)   Social  Connection and Isolation Panel [NHANES]    Frequency of Communication with Friends and Family: More than three times a week     Frequency of Social Gatherings with Friends and Family: Once a week    Attends Religious Services: More than 4 times per year    Active Member of [employer] or Organizations: Yes    Attends Engineer, structural: 1 to 4 times per year    Marital Status: Divorced     Family History: The patient's family history includes Breast cancer in her mother; Diabetes in her father, mother, and another family member; Heart disease in her father and mother; Hemophilia in an other family member; Hypertension in her father, mother, and another family member; Kidney failure in her mother; Obesity in her mother; Stroke in an other family member.  ROS:   Please see the history of present illness.     All other systems reviewed and are negative.  EKGs/Labs/Other Studies Reviewed:    The following studies were reviewed today:  EKG Interpretation Date/Time:  Thursday September 26 2023 08:35:43 EDT Ventricular Rate:  70 PR Interval:  196 QRS Duration:  102 QT Interval:  392 QTC Calculation: 423 R Axis:   -31  Text Interpretation: Normal sinus rhythm Left axis deviation Incomplete right bundle branch block Confirmed by Constancia Delton ([id number]) on 09/26/2023 8:39:49 AM    Recent Labs: 01/29/2023: Magnesium 2.2 05/15/2023: ALT 14; TSH 1.69 07/29/2023: Hemoglobin 13.4; Platelet Count 237 08/26/2023: BUN 27; Creatinine, Ser 0.63; Potassium 4.0; Sodium 143  Recent Lipid Panel    Component Value Date/Time   CHOL 161 05/15/2023 0721   CHOL 168 07/31/2021 0905   TRIG 124.0 05/15/2023 0721   HDL 56.20 05/15/2023 0721   HDL 67 07/31/2021 0905   CHOLHDL 3 05/15/2023 0721   VLDL 24.8 05/15/2023 0721   LDLCALC 80 05/15/2023 0721   LDLCALC 85 07/31/2021 0905    Physical Exam:    VS:  BP 118/76 (BP Location: Left Arm, Patient Position: Sitting)   Pulse 70   Ht 5\' 6"  (1.676 m)   Wt 262 lb (118.8 kg)   SpO2 97%   BMI 42.29 kg/m     Wt Readings from Last 3 Encounters:  09/26/23 262 lb (118.8 kg)   09/16/23 260 lb (117.9 kg)  09/04/23 264 lb 12.8 oz (120.1 kg)     GEN:  Well nourished, well developed in no acute distress, obese HEENT: Normal NECK: No JVD; No carotid bruits CARDIAC: RRR, no murmurs, rubs, gallops RESPIRATORY:  Clear to auscultation without rales, wheezing or rhonchi  ABDOMEN: Soft, non-tender, non-distended MUSCULOSKELETAL: Lower extremity edema/ lymphedema noted SKIN: Warm and dry NEUROLOGIC:  Alert and oriented x 3 PSYCHIATRIC:  Normal affect   ASSESSMENT:    1. Paroxysmal atrial fibrillation (HCC)   2. Primary hypertension   3. Hyperlipidemia, mixed   4. Morbid obesity (HCC)   5. Bilateral leg edema    PLAN:    In order of problems listed above:   Paroxysmal atrial fibrillation, currently in sinus rhythm.  A-fib occurred at the time in the context of steroid administration.  Easy bruisability with Eliquis.  Wants to stop taking.  Will refer to EP for additional input,?  ILR for monitoring if Eliquis is stopped.  Continue verapamil 120 mg CR twice daily, Eliquis for now.   hypertension.  BP controlled.  Continue Cardizem SR 120 mg twice daily, Aldactone 25 mg daily. Hyperlipidemia, continue Crestor 10 mg daily.  Morbid obesity, continue  increased activity and weight loss. Extremity edema, predominantly lymphedema, continue Lasix as needed.  Follow-up in 12 months    Medication Adjustments/Labs and Tests Ordered: Current medicines are reviewed at length with the patient today.  Concerns regarding medicines are outlined above.  Orders Placed This Encounter  Procedures   EKG 12-Lead    No orders of the defined types were placed in this encounter.    Patient Instructions    Follow-Up: At [facility], you and your health needs are our priority.  As part of our continuing mission to provide you with exceptional heart care, our providers are all part of one team.  This team includes your primary Cardiologist (physician) and Advanced  Practice Providers or APPs (Physician Assistants and Nurse Practitioners) who all work together to provide you with the care you need, when you need it.  Your next appointment:   12 month(s)  Provider:   Constancia Delton, MD    SCHEDULE FOLLOW UP APPOINTMENT WITH DR Marven Slimmer          Signed, Constancia Delton, MD  09/26/2023 9:04 AM    [facility]

## 2023-09-26 NOTE — Patient Instructions (Signed)
   Follow-Up: At [facility], you and your health needs are our priority.  As part of our continuing mission to provide you with exceptional heart care, our providers are all part of one team.  This team includes your primary Cardiologist (physician) and Advanced Practice Providers or APPs (Physician Assistants and Nurse Practitioners) who all work together to provide you with the care you need, when you need it.  Your next appointment:   12 month(s)  Provider:   Constancia Delton, MD    SCHEDULE FOLLOW UP APPOINTMENT WITH DR Marven Slimmer

## 2023-10-01 ENCOUNTER — Other Ambulatory Visit: Payer: Self-pay

## 2023-10-01 DIAGNOSIS — Z8639 Personal history of other endocrine, nutritional and metabolic disease: Secondary | ICD-10-CM

## 2023-10-02 ENCOUNTER — Inpatient Hospital Stay: Payer: HMO | Attending: Oncology

## 2023-10-02 ENCOUNTER — Encounter: Payer: Self-pay | Admitting: Oncology

## 2023-10-02 ENCOUNTER — Inpatient Hospital Stay (HOSPITAL_BASED_OUTPATIENT_CLINIC_OR_DEPARTMENT_OTHER): Payer: HMO | Admitting: Oncology

## 2023-10-02 DIAGNOSIS — D509 Iron deficiency anemia, unspecified: Secondary | ICD-10-CM | POA: Diagnosis not present

## 2023-10-02 DIAGNOSIS — Z8639 Personal history of other endocrine, nutritional and metabolic disease: Secondary | ICD-10-CM

## 2023-10-02 LAB — CBC (CANCER CENTER ONLY)
HCT: 41 % (ref 36.0–46.0)
Hemoglobin: 13.7 g/dL (ref 12.0–15.0)
MCH: 30.6 pg (ref 26.0–34.0)
MCHC: 33.4 g/dL (ref 30.0–36.0)
MCV: 91.7 fL (ref 80.0–100.0)
Platelet Count: 251 10*3/uL (ref 150–400)
RBC: 4.47 MIL/uL (ref 3.87–5.11)
RDW: 12.6 % (ref 11.5–15.5)
WBC Count: 6.1 10*3/uL (ref 4.0–10.5)
nRBC: 0 % (ref 0.0–0.2)

## 2023-10-02 LAB — IRON AND TIBC
Iron: 84 ug/dL (ref 28–170)
Saturation Ratios: 21 % (ref 10.4–31.8)
TIBC: 406 ug/dL (ref 250–450)
UIBC: 322 ug/dL

## 2023-10-02 LAB — FERRITIN: Ferritin: 52 ng/mL (ref 11–307)

## 2023-10-02 NOTE — Progress Notes (Signed)
 Hematology/Oncology Consult note [facility]  [phone] [phone]  Patient Care Team: Judithann Novas, MD as PCP - General (Family Medicine) Constancia Delton, MD as PCP - Cardiology (Cardiology) Boyce Byes, MD as PCP - Electrophysiology (Cardiology) Heniford, Ulanda Gambles, MD as Referring Physician (Surgery) Marc Senior, MD as Consulting Physician (Pulmonary Disease) Oralee Billow, MD as Consulting Physician (General Surgery) Clair Crews, MD as Referring Physician (Ophthalmology) Avonne Boettcher, MD as Consulting Physician (Oncology)   Name of the patient: Kimberly Patton  [id number]  24-Jan-1956   Date of visit: 10/02/23  Diagnosis-iron deficiency anemia  Chief complaint/ Reason for visit-routine follow-up of iron deficiency anemia  Heme/Onc history: patient is a 68 year old female with a past medical history significant for hypertension, asthma, osteoporosis, laparoscopic sleeve streptokinase surgery in December 2014 referred for iron deficiency anemiaCBC from 05/15/2023 showed white cell count of 6.7, H&H of 12.6/38.9 with a platelet count of 315.  B12 levels were normal at 1057.  Iron studies showed a low ferritin of 5 with an iron saturation of 7% and elevated TIBC which has been a chronic issue for her and her ferritin levels have been low at least dating back to July 2021.She had an upper endoscopy by Dr. Emerick Hanlon in May 2024 which showed 3 cm hiatal hernia.  LA grade a reflux esophagitis without bleeding and sleeve gastrectomy with healthy-appearing mucosa.  Last colonoscopy was in November 2022 by Dr. Antony Baumgartner which showed 2 polyps in the transverse colon and 1 in the ascending colon which were resected and were negative for malignancy.  She also has a prior history of complex sclerosing lesion status post right lumpectomy in January 2023 which showed fibrocystic changes with epithelial hyperplasia without atypia and negative  for carcinoma   She received IV iron in January 2025  Interval history-presently she feels well and denies any complaints at this time.  Appetite and weight have remained stable.  ECOG PS- 1 Pain scale- 0 Opioid associated constipation- no  Review of systems- Review of Systems  Constitutional:  Negative for chills, fever, malaise/fatigue and weight loss.  HENT:  Negative for congestion, ear discharge and nosebleeds.   Eyes:  Negative for blurred vision.  Respiratory:  Negative for cough, hemoptysis, sputum production, shortness of breath and wheezing.   Cardiovascular:  Negative for chest pain, palpitations, orthopnea and claudication.  Gastrointestinal:  Negative for abdominal pain, blood in stool, constipation, diarrhea, heartburn, melena, nausea and vomiting.  Genitourinary:  Negative for dysuria, flank pain, frequency, hematuria and urgency.  Musculoskeletal:  Negative for back pain, joint pain and myalgias.  Skin:  Negative for rash.  Neurological:  Negative for dizziness, tingling, focal weakness, seizures, weakness and headaches.  Endo/Heme/Allergies:  Does not bruise/bleed easily.  Psychiatric/Behavioral:  Negative for depression and suicidal ideas. The patient does not have insomnia.       Allergies  Allergen Reactions   Wasp Venom Anaphylaxis   Chlorhexidine  Gluconate     Itching under breasts   Metronidazole Other (See Comments)    Chest tightness, neck tightness   Prednisone  Other (See Comments)    Severe migraine / with taper dose Told by Cardiologist to avoid due to Afib   Other Rash    Bleach   Sodium Hypochlorite Rash    Liquid clorox bleach     Past Medical History:  Diagnosis Date   ALLERGIC RHINITIS 04/03/2007   Allergy    Not sure   Anemia    ((Pt  Qnr Sub: Denies at visit from 08/28/2021 with endocrinology))    Anginal pain (HCC)    Anxiety    Arthritis Not sure   Asthma    hx of years ago - no longer a problem    ASTHMA, PERSISTENT, MODERATE  04/03/2007   Back pain    Chest pain    Depression screen 01/08/2023   Dyspnea    with exertion   Dyspnea on exertion    a. 11/2007 Echo: EF 60%.   Dysrhythmia    hx of heart arrhythmia 10-15 years ago - followed by DR Carolynne Citron - not seen in years    Empty sella syndrome (HCC)    GERD (gastroesophageal reflux disease)    Headache    Hemophilia carrier    Hiatal hernia    History of kidney stones    Hypercholesteremia 06/09/2019   Hypertension    Joint pain    HIPS / LEGS   Joint pain    Knee injury    RT   Lymph edema    bilateral legs   Meningioma (HCC)    Morbid obesity (HCC)    Neuromuscular disorder (HCC) Not sure, since my fall   Occipital neuralgia    Osteoarthritis    Palpitations    Paroxysmal SVT (supraventricular tachycardia) (HCC)    a. 11/2011 48h Holter: RSR, rare PVC's, occas PAC's   Retinal tear of right eye 01/2016   Skin cancer    SOB (shortness of breath)    Thyroid  nodule 04/15/2018   Ventral hernia      Past Surgical History:  Procedure Laterality Date   ABDOMINAL HYSTERECTOMY  Think 1994   APPENDECTOMY  1996   ARTERY BIOPSY Left 03/21/2022   Procedure: BIOPSY TEMPORAL ARTERY;  Surgeon: Jackquelyn Mass, MD;  Location: [facility] ORS;  Service: Vascular;  Laterality: Left;   BIOPSY THYROID      08/2021   BREAST CYST EXCISION Right 1994   Benign   CARDIOVERSION N/A 11/12/2022   Procedure: CARDIOVERSION;  Surgeon: Devorah Fonder, MD;  Location: [facility];  Service: Cardiovascular;  Laterality: N/A;   CESAREAN SECTION     x2   CHOLECYSTECTOMY  1995   COLONOSCOPY WITH PROPOFOL  N/A 05/09/2021   Procedure: COLONOSCOPY WITH PROPOFOL ;  Surgeon: Luke Salaam, MD;  Location: [facility];  Service: Gastroenterology;  Laterality: N/A;   ESOPHAGOGASTRODUODENOSCOPY (EGD) WITH PROPOFOL  N/A 11/02/2022   Procedure: ESOPHAGOGASTRODUODENOSCOPY (EGD) WITH PROPOFOL ;  Surgeon: Shane Darling, MD;  Location: [facility] ENDOSCOPY;  Service: Endoscopy;  Laterality: N/A;    EYE SURGERY  01/2016   Repair retinal tear   FOOT SURGERY  1995 / 1996   x2   HERNIA REPAIR     KNEE ARTHROSCOPY W/ MENISCAL REPAIR  07/25/2013   right knee Dr. Agatha Horsfall   LAPAROSCOPIC GASTRIC SLEEVE RESECTION N/A 06/08/2013   Procedure: LAPAROSCOPIC GASTRIC SLEEVE RESECTION AND EXCISION OF SEBACEUS CYST FROM MID CHEST takedown of incarcerated ventral hernia and primary repair endoscopy;  Surgeon: Azucena Bollard, MD;  Location: [facility];  Service: General;  Laterality: N/A;   LAPAROSCOPIC NISSEN FUNDOPLICATION N/A 02/02/2020   Procedure: LAPAROSCOPIC REPAIR LARGE SYMPTOMATIC HIATAL HERNIA WITH UPPER ENDOSCOPY;  Surgeon: Jacolyn Matar, MD;  Location: [facility];  Service: General;  Laterality: N/A;   moles  06/2013   removed 2 moles from under arm and  lowback   NECK SURGERY     occipital nerve damage- injections    OVARIAN CYST REMOVAL  1970   PILONIDAL CYST  EXCISION  1975   RADIOACTIVE SEED GUIDED EXCISIONAL BREAST BIOPSY Right 06/23/2021   Procedure: RADIOACTIVE SEED GUIDED EXCISIONAL RIGHT BREAST BIOPSY;  Surgeon: Jacolyn Matar, MD;  Location: [facility];  Service: General;  Laterality: Right;   RETINAL TEAR REPAIR CRYOTHERAPY Right 01/2016   [place]   SKIN CANCER EXCISION     TEE WITHOUT CARDIOVERSION N/A 11/12/2022   Procedure: TRANSESOPHAGEAL ECHOCARDIOGRAM;  Surgeon: Devorah Fonder, MD;  Location: [facility];  Service: Cardiovascular;  Laterality: N/A;   THYROID  LOBECTOMY Left 08/21/2022   Procedure: LEFT THYROID  LOBECTOMY;  Surgeon: Oralee Billow, MD;  Location: [facility];  Service: General;  Laterality: Left;   THYROIDECTOMY, PARTIAL     TONSILLECTOMY     TUBAL LIGATION  Think 1994   UPPER GI ENDOSCOPY  06/08/2013   Procedure: UPPER GI ENDOSCOPY;  Surgeon: Azucena Bollard, MD;  Location: [facility];  Service: General;;   US  ECHOCARDIOGRAPHY  12/2011   WNL - EF 55-60%, mild MR, grade 1 diastolic dysnfiction (mild)   VENTRAL HERNIA REPAIR  2000    Social History    Socioeconomic History   Marital status: Divorced    Spouse name: Not on file   Number of children: 2   Years of education: 17   Highest education level: Bachelor's degree (e.g., BA, AB, BS)  Occupational History   Occupation: Magazine features editor: [employer]   Occupation: Retired Runner, broadcasting/film/video  Tobacco Use   Smoking status: Never   Smokeless tobacco: Never   Tobacco comments:    smoke at age [age]  Vaping Use   Vaping status: Never Used  Substance and Sexual Activity   Alcohol use: Not Currently   Drug use: No   Sexual activity: Not Currently    Partners: Male    Birth control/protection: Abstinence, Pill  Other Topics Concern   Not on file  Social History Narrative   Lives at home alone.   Right-handed.   Occasional caffeine use.   Social Drivers of Corporate investment banker Strain: Low Risk  (06/26/2023)   Overall Financial Resource Strain (CARDIA)    Difficulty of Paying Living Expenses: Not hard at all  Food Insecurity: No Food Insecurity (06/26/2023)   Hunger Vital Sign    Worried About Running Out of Food in the Last Year: Never true    Ran Out of Food in the Last Year: Never true  Transportation Needs: No Transportation Needs (06/26/2023)   PRAPARE - Administrator, Civil Service (Medical): No    Lack of Transportation (Non-Medical): No  Physical Activity: Insufficiently Active (06/26/2023)   Exercise Vital Sign    Days of Exercise per Week: 1 day    Minutes of Exercise per Session: 10 min  Stress: No Stress Concern Present (06/26/2023)   [employer] of Occupational Health - Occupational Stress Questionnaire    Feeling of Stress : Not at all  Social Connections: Moderately Integrated (06/26/2023)   Social Connection and Isolation Panel [NHANES]    Frequency of Communication with Friends and Family: More than three times a week    Frequency of Social Gatherings with Friends and Family: Once a week    Attends Religious Services: More  than 4 times per year    Active Member of [employer] or Organizations: Yes    Attends Banker Meetings: 1 to 4 times per year    Marital Status: Divorced  Intimate Partner Violence: Not At Risk (05/31/2023)  Humiliation, Afraid, Rape, and Kick questionnaire    Fear of Current or Ex-Partner: No    Emotionally Abused: No    Physically Abused: No    Sexually Abused: No    Family History  Problem Relation Age of Onset   Breast cancer Mother    Diabetes Mother    Hypertension Mother    Kidney failure Mother    Heart disease Mother    Obesity Mother    Diabetes Father    Hypertension Father    Heart disease Father    Diabetes Other    Hypertension Other    Stroke Other    Hemophilia Other      Current Outpatient Medications:    apixaban  (ELIQUIS ) 5 MG TABS tablet, Take 1 tablet (5 mg total) by mouth 2 (two) times daily., Disp: 60 tablet, Rfl: 11   Calcium  Citrate-Vitamin D  (CITRACAL + D PO), Take 2 tablets by mouth daily. , Disp: , Rfl:    Cholecalciferol (VITAMIN D3) 1.25 MG (50000 UT) CAPS, Take 1 capsule (1.25 mg total) by mouth once a week., Disp: 12 capsule, Rfl: 0   Coenzyme Q10-Vitamin E  (QUNOL ULTRA COQ10) 100-150 MG-UNIT CAPS, Take 100 mg by mouth daily., Disp: , Rfl:    Cyanocobalamin  (VITAMIN B-12) 2500 MCG SUBL, Place 2,500 mcg under the tongue daily., Disp: , Rfl:    diltiazem  (CARDIZEM ) 30 MG tablet, Take 30 mg by mouth 2 (two) times daily as needed., Disp: , Rfl:    DULoxetine (CYMBALTA) 30 MG capsule, Take 30 mg by mouth daily., Disp: , Rfl:    esomeprazole (NEXIUM) 40 MG capsule, Take 40 mg by mouth daily., Disp: , Rfl:    furosemide  (LASIX ) 20 MG tablet, Take 1 tablet (20 mg total) by mouth daily as needed., Disp: 90 tablet, Rfl: 3   Lactobacillus Rhamnosus, GG, (CULTURELLE IMMUNITY SUPPORT PO), Take 1 capsule by mouth daily., Disp: , Rfl:    levalbuterol  (XOPENEX  HFA) 45 MCG/ACT inhaler, Inhale 2 puffs into the lungs every 6 (six) hours as needed for  wheezing., Disp: 1 each, Rfl: 2   Menaquinone-7 (VITAMIN K2  PO), Take 100 mg by mouth daily., Disp: , Rfl:    metFORMIN  (GLUCOPHAGE -XR) 500 MG 24 hr tablet, Take 1 tablet (500 mg total) by mouth 2 (two) times daily with a meal., Disp: 180 tablet, Rfl: 0   methocarbamol  (ROBAXIN ) 500 MG tablet, Take 500 mg by mouth at bedtime as needed for muscle spasms., Disp: , Rfl:    Multiple Vitamins-Minerals (CENTRUM SILVER 50+WOMEN PO), Take 1 tablet by mouth daily., Disp: , Rfl:    nitroGLYCERIN  (NITROSTAT ) 0.4 MG SL tablet, Place 1 tablet (0.4 mg total) under the tongue every 5 (five) minutes as needed for chest pain., Disp: 25 tablet, Rfl: 3   Riboflavin  400 MG CAPS, Take 400 mg by mouth daily., Disp: , Rfl:    rosuvastatin  (CRESTOR ) 10 MG tablet, Take 1 tablet (10 mg total) by mouth daily., Disp: 90 tablet, Rfl: 3   spironolactone  (ALDACTONE ) 25 MG tablet, Take 1 tablet (25 mg total) by mouth daily., Disp: 90 tablet, Rfl: 3   traMADol  (ULTRAM ) 50 MG tablet, Take 1 tablet (50 mg total) by mouth every 6 (six) hours as needed., Disp: 12 tablet, Rfl: 0   TURMERIC PO, Take 2 tablets by mouth daily. 500 mg  With Ginger 50 mg Gummies, Disp: , Rfl:    verapamil  (CALAN -SR) 120 MG CR tablet, Take 1 tablet (120 mg total) by mouth 2 (two) times  daily., Disp: 180 tablet, Rfl: 3 No current facility-administered medications for this visit.  Facility-Administered Medications Ordered in Other Visits:    albuterol  (PROVENTIL ) (2.5 MG/3ML) 0.083% nebulizer solution 2.5 mg, 2.5 mg, Nebulization, Once, Marc Senior, MD  Physical exam:  Vitals:   10/02/23 0918 10/02/23 0919  BP: 96/84 109/84  Pulse: 69 71  Resp: 16   Temp: 98.4 F (36.9 C)   Weight: 264 lb 6.4 oz (119.9 kg)    Physical Exam Cardiovascular:     Rate and Rhythm: Normal rate and regular rhythm.     Heart sounds: Normal heart sounds.  Pulmonary:     Effort: Pulmonary effort is normal.     Breath sounds: Normal breath sounds.  Skin:     General: Skin is warm and dry.  Neurological:     Mental Status: She is alert and oriented to person, place, and time.     I have personally reviewed labs listed below:    Latest Ref Rng & Units 08/26/2023    8:19 AM  CMP  Glucose 70 - 99 mg/dL 78   BUN 8 - 27 mg/dL 27   Creatinine 1.61 - 1.00 mg/dL 0.96   Sodium 045 - 409 mmol/L 143   Potassium 3.5 - 5.2 mmol/L 4.0   Chloride 96 - 106 mmol/L 101   CO2 20 - 29 mmol/L 24   Calcium  8.7 - 10.3 mg/dL 9.9       Latest Ref Rng & Units 10/02/2023    9:06 AM  CBC  WBC 4.0 - 10.5 K/uL 6.1   Hemoglobin 12.0 - 15.0 g/dL 81.1   Hematocrit 91.4 - 46.0 % 41.0   Platelets 150 - 400 K/uL 251    I have personally reviewed Radiology images listed below: No images are attached to the encounter.  DG Bone Density Result Date: 09/11/2023 EXAM: DUAL X-RAY ABSORPTIOMETRY (DXA) FOR BONE MINERAL DENSITY IMPRESSION: Your patient Darriona Dehaas completed a BMD test on 09/11/2023 using the [employer] iDXA DXA System (software version: 14.10) manufactured by [employer]. The following summarizes the results of our evaluation. Technologist: SCE PATIENT BIOGRAPHICAL: Name: Kynzley, Dowson Patient ID: [id number] Birth Date: 1955-12-30 Height: 65.5 in. Gender: Female Exam Date: 09/11/2023 Weight: 265.9 lbs. Indications: Advanced Age, Caucasian, Tonna Frederic, History of Fracture (Adult), Oophorectomy Unilateral, Osteoarthritis, Postmenopausal, Pre-Diabetic Fractures: Ribs Treatments: calcium  w/ vit D, Metformin , Multi-Vitamin, Nexium, Vitamin D  DENSITOMETRY RESULTS: Site      Region      Measured Date Measured Age WHO Classification Young Adult T-score BMD         %Change vs. Previous Significant Change (*) AP Spine L1-L3 09/11/2023 67.8 Osteopenia -2.4 0.888 g/cm2 - - DualFemur Total Right 09[age]11/2023 67.8 Osteoporosis -2.8 0.655 g/cm2 - - DualFemur Total Mean 09[age]11/2023 67.8 Osteoporosis -2.6 0.680 g/cm2 - - ASSESSMENT: The BMD measured at Femur Total Right is 0.655  g/cm2 with a T-score of -2.8. This patient is considered osteoporotic according to World Health Organization [facility]) criteria. The scan quality is good. L4 was excluded due to degenerative changes. World Science writer [facility]) criteria for post-menopausal, Caucasian Women: Normal:                   T-score at or above -1 SD Osteopenia/low bone mass: T-score between -1 and -2.5 SD Osteoporosis:             T-score at or below -2.5 SD RECOMMENDATIONS: 1. All patients should optimize calcium  and vitamin D  intake. 2.  Consider FDA-approved medical therapies in postmenopausal women and men aged 18 years and older, based on the following: a. A hip or vertebral(clinical or morphometric) fracture b. T-score < -2.5 at the femoral neck or spine after appropriate evaluation to exclude secondary causes c. Low bone mass (T-score between -1.0 and -2.5 at the femoral neck or spine) and a 10-year probability of a hip fracture > 3% or a 10-year probability of a major osteoporosis-related fracture > 20% based on the [place] -adapted WHO algorithm 3. Clinician judgment and/or patient preferences may indicate treatment for people with 10-year fracture probabilities above or below these levels FOLLOW-UP: People with diagnosed cases of osteoporosis or at high risk for fracture should have regular bone mineral density tests. For patients eligible for Medicare, routine testing is allowed once every 2 years. The testing frequency can be increased to one year for patients who have rapidly progressing disease, those who are receiving or discontinuing medical therapy to restore bone mass, or have additional risk factors. I have reviewed this report, and agree with the above findings. [facility] Electronically Signed   By: Dina  Arceo M.D.   On: 09/11/2023 09:05     Assessment and plan- Patient is a 68 y.o. female here for routine follow-up of iron deficiency anemia  Patient is not presently anemic with an H&H of 13.7/41.  Iron  studies are within normal limits with a ferritin level of 52 and normal iron studies.  She does not require any IV iron at this time.  CBC ferritin and iron studies in 4 and 8 months and I will see her back in 8 months   Visit Diagnosis 1. History of iron deficiency      Dr. Seretha Dance, MD, MPH [facility] [phone] 10/02/2023 10:24 AM

## 2023-10-02 NOTE — Progress Notes (Signed)
 Patient denies new or acute problems/concerns today.

## 2023-10-07 ENCOUNTER — Encounter: Payer: Self-pay | Admitting: Oncology

## 2023-10-08 ENCOUNTER — Telehealth: Payer: Self-pay | Admitting: Cardiology

## 2023-10-08 NOTE — Telephone Encounter (Signed)
 Called patient regarding BP concerns. She stated she thinks she is okay now and due to see Dr Marven Slimmer tomorrow afternoon. She was afraid she was in atrial fib again because her Jeraldine Molt said "possible a fib". She said last night she had spicy food and had bad gerd this morning but she was still able to get up and go get her car serviced and when she got home she was fatigued and took her blood pressure and noticed it was low. 80's over 60's. She drank some liquids and put her feet up and has been taking her HR and blood pressure periodically and it was now 124/77 on the phone and she was in NSR with a heart rate of 68 bpm. She explained she was okay to call back if any other concerns persisted and she would be in to see Dr. Marven Slimmer tomorrow. She would like to know what to do in the future regarding low blood pressure and a fib. She knows what to do for high BP, but not sure about what to do for low blood pressure.

## 2023-10-08 NOTE — Telephone Encounter (Signed)
 Pt c/o BP issue: STAT if pt c/o blurred vision, one-sided weakness or slurred speech.  STAT if BP is GREATER than 180/120 TODAY.  STAT if BP is LESS than 90/60 and SYMPTOMATIC TODAY  1. What is your BP concern?   Patient is concerned about her sudden low BP reading  2. Have you taken any BP medication today?  Yes  3. What are your last 5 BP readings?  Today: 110/90  (While on the phone) 84/59 86/58  4. Are you having any other symptoms (ex. Dizziness, headache, blurred vision, passed out)?   No  Patient is concerned her BP has been trending low.

## 2023-10-09 ENCOUNTER — Ambulatory Visit: Attending: Cardiology | Admitting: Cardiology

## 2023-10-09 ENCOUNTER — Encounter: Payer: Self-pay | Admitting: Cardiology

## 2023-10-09 VITALS — BP 110/79 | HR 64 | Ht 66.0 in | Wt 261.0 lb

## 2023-10-09 DIAGNOSIS — I480 Paroxysmal atrial fibrillation: Secondary | ICD-10-CM

## 2023-10-09 DIAGNOSIS — I10 Essential (primary) hypertension: Secondary | ICD-10-CM

## 2023-10-09 NOTE — Progress Notes (Signed)
  Electrophysiology Office Follow up Visit Note:    Date:  10/09/2023   ID:  Kimberly Patton, DOB 21-May-1956, MRN [id number]  PCP:  Judithann Novas, MD  [facility] Cardiologist:  Constancia Delton, MD  [facility] Electrophysiologist:  Boyce Byes, MD    Interval History:     Kimberly Patton is a 68 y.o. female who presents for a follow up visit.   Saw the patient February 01, 2023.  She has a history of SVT, hypertension.  She also has atrial fibrillation and takes Eliquis  for stroke prophylaxis.  She is doing okay today.  Yesterday she felt dehydrated although she had not been taking her Lasix .  When she was dehydrated she was hypotensive and took a tracing from her [facility] which showed atrial fibrillation.  This was confirmed by me today.  She is on Eliquis  5 mg by mouth twice daily.  She asks a lot of questions today about why she needs to continue taking Eliquis  for stroke prophylaxis.  She is concerned about the risk of bleeding.  She has friends that have atrial fibrillation who do not take anticoagulation and she is wondering why their situation is different.      Past medical, surgical, social and family history were reviewed.  ROS:   Please see the history of present illness.    All other systems reviewed and are negative.  EKGs/Labs/Other Studies Reviewed:    The following studies were reviewed today:  September 26, 2023 EKG shows sinus rhythm        Physical Exam:    VS:  BP 110/79 (BP Location: Left Arm, Patient Position: Sitting, Cuff Size: Large)   Pulse 64   Ht 5\' 6"  (1.676 m)   Wt 261 lb (118.4 kg)   SpO2 95%   BMI 42.13 kg/m     Wt Readings from Last 3 Encounters:  10/09/23 261 lb (118.4 kg)  10/02/23 264 lb 6.4 oz (119.9 kg)  09/26/23 262 lb (118.8 kg)     GEN: no distress CARD: RRR, No MRG RESP: No IWOB. CTAB.      ASSESSMENT:    1. Paroxysmal atrial fibrillation (HCC)   2. Primary hypertension    PLAN:    In order of  problems listed above:  #Atrial tachycardia #Paroxysmal atrial fibrillation Continue Eliquis  for stroke prophylaxis.  I had an extensive conversation with the patient about the risk of stroke associated with atrial fibrillation.  We discussed how antiplatelets and anticoagulants affect that risk.  We discussed the Watchman procedure.  She will pray about her options and will let us  know if she decides to stop anticoagulant.  I made it clear today that my recommendation would be continued anticoagulation given her risk of strokes. Continue verapamil   #Hypertension At goal today.  Recommend checking blood pressures 1-2 times per week at home and recording the values.  Recommend bringing these recordings to the primary care physician.   Follow-up as needed with EP     Signed, Harvie Liner, MD, [facility], [facility] 10/09/2023 3:13 PM    Electrophysiology [facility]

## 2023-10-09 NOTE — Patient Instructions (Signed)
 Medication Instructions:  Your physician recommends that you continue on your current medications as directed. Please refer to the Current Medication list given to you today.  *If you need a refill on your cardiac medications before your next appointment, please call your pharmacy*  Follow-Up: At [facility], you and your health needs are our priority.  As part of our continuing mission to provide you with exceptional heart care, our providers are all part of one team.  This team includes your primary Cardiologist (physician) and Advanced Practice Providers or APPs (Physician Assistants and Nurse Practitioners) who all work together to provide you with the care you need, when you need it.  Your next appointment:   As needed with Dr. Lalla Brothers

## 2023-10-11 ENCOUNTER — Encounter: Payer: Self-pay | Admitting: Oncology

## 2023-10-15 IMAGING — MG MM PLC BREAST LOC DEV 1ST LESION INC*R*
7 series · 7 of 7 positions shown · non-contrast
Comparison: Previous exam(s).

CLINICAL DATA: 65-year-old female with recently diagnosed complex
sclerosing lesion of the right breast. Patient presents for seed
localization prior to excision.

EXAM:
MAMMOGRAPHIC GUIDED RADIOACTIVE SEED LOCALIZATION OF THE RIGHT
BREAST

[R CC (1 of 4)]
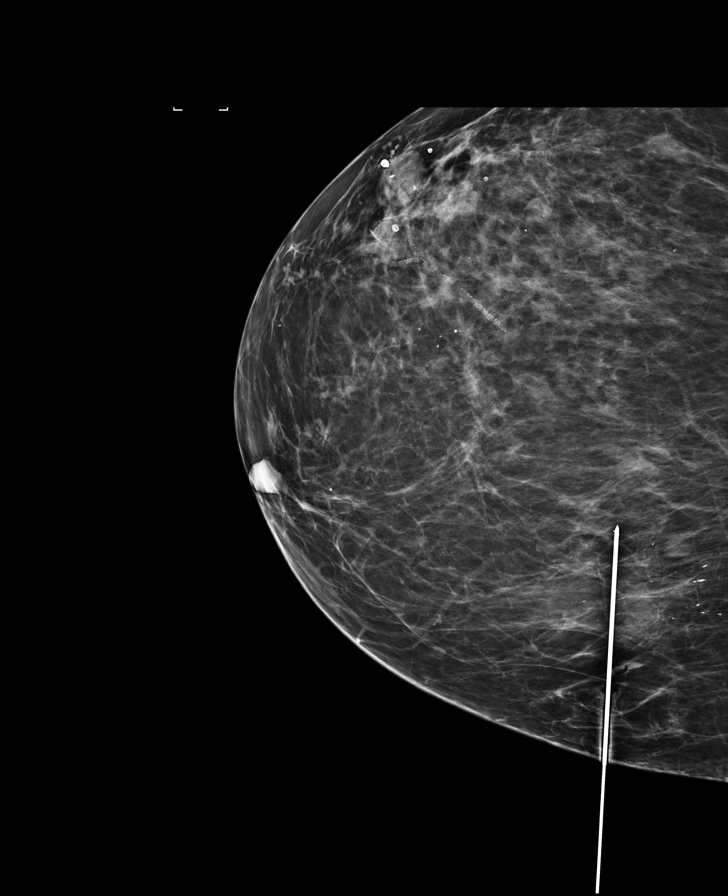

[R CC (2 of 4)]
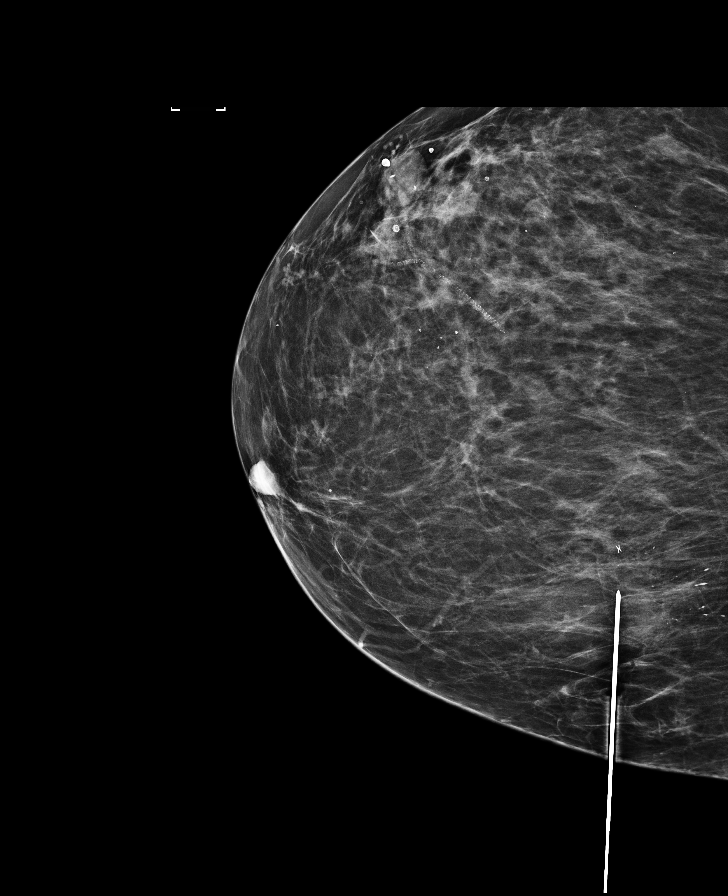

[R ML (1 of 3)]
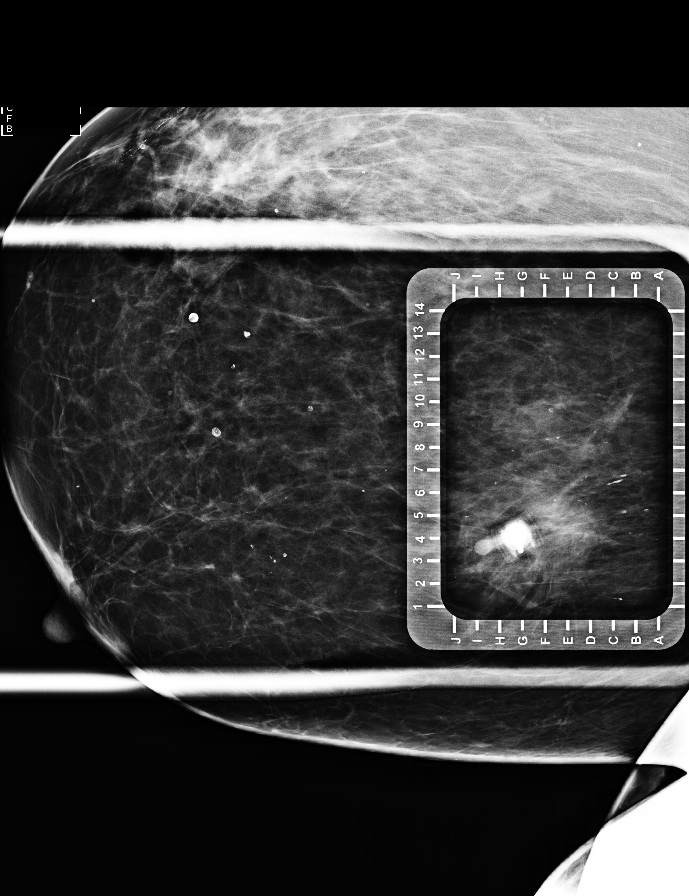

[R ML (2 of 3)]
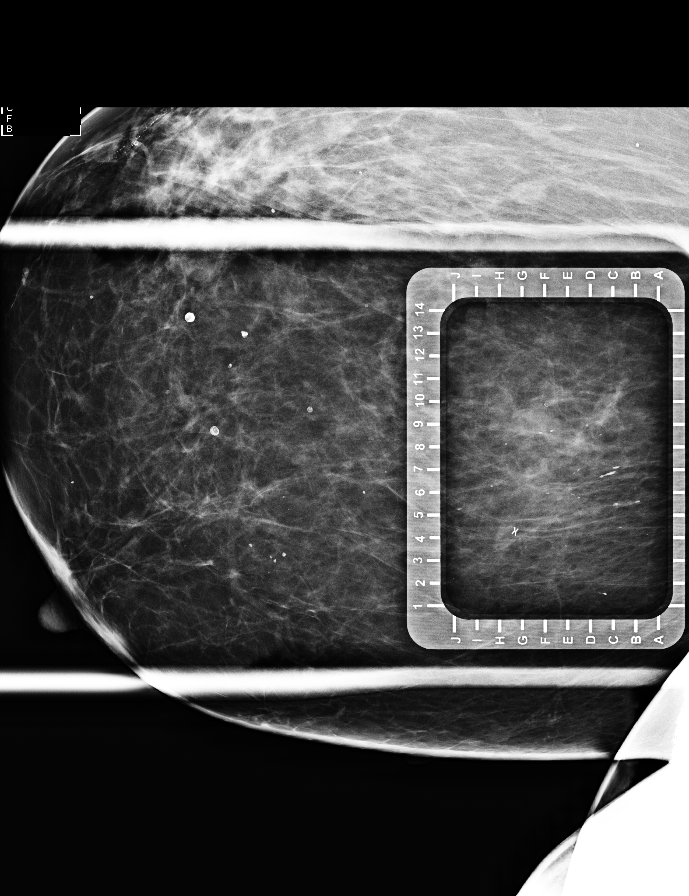

[R ML (3 of 3)]
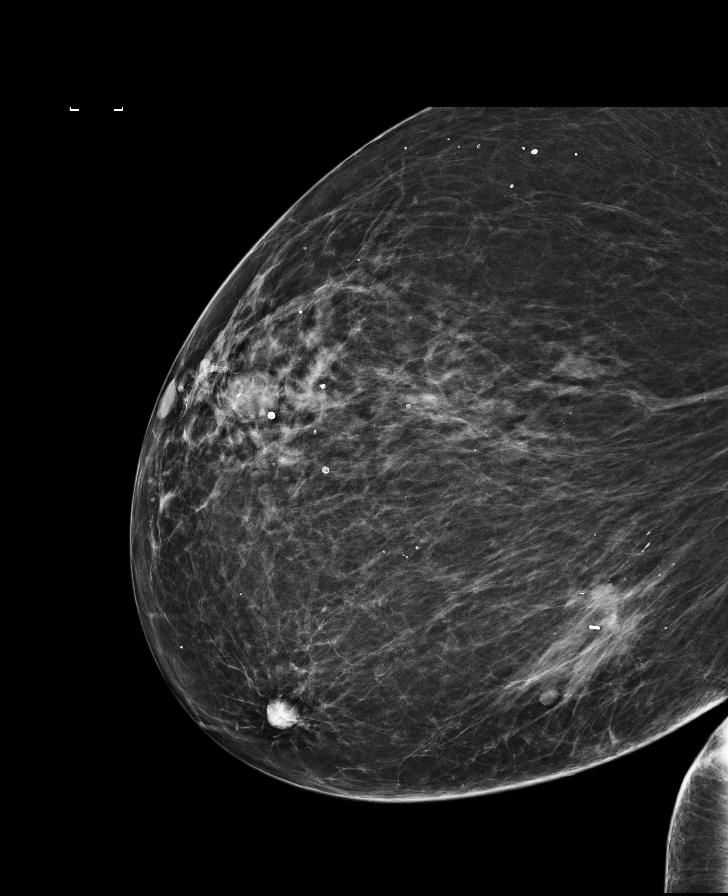

[R CC (3 of 4)]
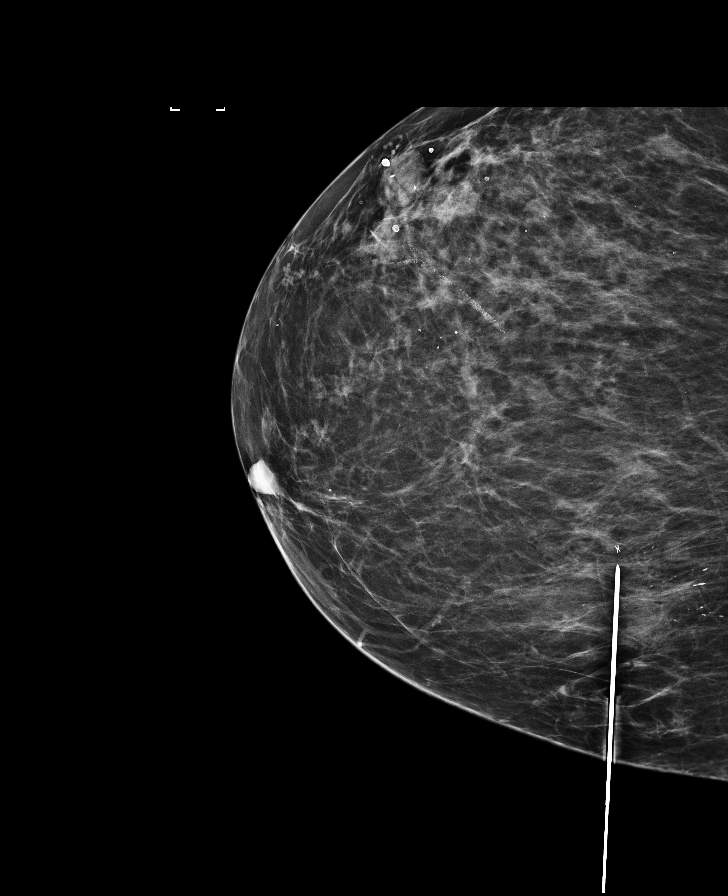

[R CC (4 of 4)]
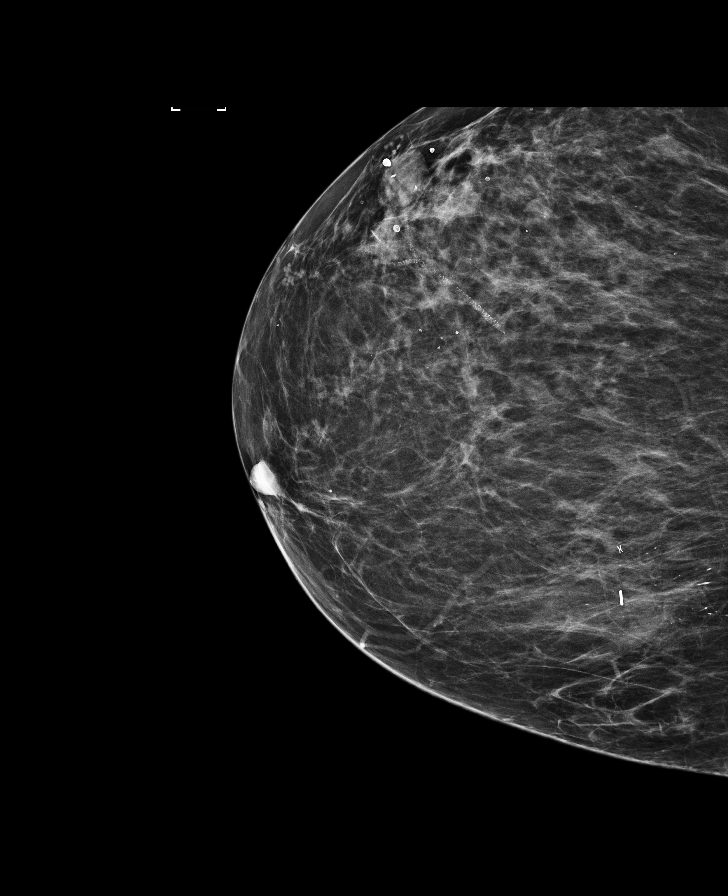

[7 of 7 positions shown; findings below may reference images not displayed]

FINDINGS: Patient presents for radioactive seed localization prior to right
breast excision. I met with the patient and we discussed the
procedure of seed localization including benefits and alternatives.
We discussed the high likelihood of a successful procedure. We
discussed the risks of the procedure including infection, bleeding,
tissue injury and further surgery. We discussed the low dose of
radioactivity involved in the procedure. Informed, written consent
was given.

The usual time-out protocol was performed immediately prior to the
procedure.

Using mammographic guidance, sterile technique, 1% lidocaine and an
C-PH6 radioactive seed, the asymmetry in the lower inner right
breast was localized using a medial approach. The follow-up
mammogram images confirm the seed in the expected location and were
marked for Dr. Laos.

Follow-up survey of the patient confirms presence of the radioactive
seed.

Order number of C-PH6 seed:  [id number].

Total activity: 0.244 mCi reference Date: 31 May, 2021

The patient tolerated the procedure well and was released from the
[REDACTED]. She was given instructions regarding seed removal.
IMPRESSION: Radioactive seed localization right breast. No apparent
complications.

## 2023-10-23 ENCOUNTER — Ambulatory Visit: Admitting: Physician Assistant

## 2023-10-23 ENCOUNTER — Encounter: Payer: Self-pay | Admitting: Physician Assistant

## 2023-10-23 DIAGNOSIS — M810 Age-related osteoporosis without current pathological fracture: Secondary | ICD-10-CM

## 2023-10-23 NOTE — Progress Notes (Signed)
 Office Visit Note   Patient: Kimberly Patton           Date of Birth: Dec 02, 1955           MRN: [id number] Visit Date: 10/23/2023              Requested by: Kimberly Novas, MD [address] [place],  [place] [address] PCP: Kimberly Novas, MD   Assessment & Plan: Visit Diagnoses:  1. Age-related osteoporosis without current pathological fracture     Plan: [name] is a pleasant 68 year old woman who is referred today for evaluation of osteoporosis.  She has taken several osteoporosis medications in the past.  She did try Prolia  it gave her debilitating bone pain.  She did try Reclast  and ended up with a significant reaction and was taken to the hospital in the ER and was told not to take it again.  She has gotten very weary since having a COVID-vaccine she does not do well with injections in general.  She has been taking Actonel .  She has no history of a hip spine or wrist fracture.  She does have a significant history of heart disease.  She did have a small skin cancer removed.  She does not have any kidney disease or ulcerative disease.  She has had gastric bypass surgery and has severe reflux.  She was in her 33s when she went through menopause she takes 2 Citrucel calcium 's a day and 50,000 units of vitamin D  a week.  This is monitored by her primary care physician.  She is not a smoker and she does not drink.  She does walk but not as often now because she has been having some knee issues.  She does get gel injections into her knees but cannot tolerate steroid injections as it places her in A-fib.  She has no family history of fracture and she has not had any major dental work.  I spent 30 minutes talking about osteoporosis with her she really does not have good options for medications other than if she tolerates the Actonel  that she is currently I believe getting she is not been able to tolerate other bisphosphonates such as Reclast .  She had a bad reaction to Prolia .  With her  history of coronary artery disease she is not a good patient for Evista or Evenity.  She could try Forteo or Tymlos.  She is not interested in doing those.  We did talk about out of the box kind of things as as well as keeping her strength up to increase her muscle mass.  She should continue to be followed with every 2-year bone density scans.  We also talked to her about a well-rounded diet and briefly discussed adding prunes to her diet which in some studies has shown to slow down the rate of bone loss.  She may follow-up with me as needed  Follow-Up Instructions: Return if symptoms worsen or fail to improve.   Orders:  No orders of the defined types were placed in this encounter.  No orders of the defined types were placed in this encounter.     Procedures: No procedures performed   Clinical Data: No additional findings.   Subjective: No chief complaint on file.   HPI patient is a pleasant 68 year old woman comes in today referral for evaluation of osteoporosis.  She did recently have a bone density scan which demonstrated osteoporosis in her hip osteopenia in her back.  She has  significant medical history including coronary artery disease when she had a COVID-vaccine she became very sensitive to vaccines and has tried several different antiresorptive osteoporosis medications and has had severe reactions  Review of Systems  All other systems reviewed and are negative.    Objective: Vital Signs: There were no vitals taken for this visit.  Physical Exam Pulmonary:     Effort: Pulmonary effort is normal.  Skin:    General: Skin is warm and dry.  Neurological:     General: No focal deficit present.     Mental Status: She is alert and oriented to person, place, and time.  Psychiatric:        Mood and Affect: Mood normal.        Behavior: Behavior normal.     Ortho Exam  Specialty Comments:  No specialty comments available.  Imaging: No results found.   PMFS  History: Patient Active Problem List   Diagnosis Date Noted   Sebaceous cyst of labia 06/27/2023   Vaginal irritation 06/27/2023   Abnormal food appetite 06/26/2023   Iron deficiency 05/23/2023   Dizziness 01/29/2023   Prediabetes 01/08/2023   Abnormal metabolism 01/08/2023   Paroxysmal atrial fibrillation with RVR (HCC) 11/09/2022   HTN (hypertension) 11/09/2022   Asthma 11/09/2022   Chest pain with moderate risk for cardiac etiology 11/09/2022   History of lobectomy of thyroid  09/20/2022   Left thyroid  nodule 08/21/2022   Acute cystitis 08/17/2022   COVID-19 virus infection 04/30/2022   Paroxysmal atrial fibrillation (HCC) 03/28/2022   Empty sella syndrome (HCC) 03/28/2022   Varicose veins with inflammation 01/07/2022   S/P breast lumpectomy 10/10/2021   Cervical radiculopathy 06/28/2020   Chronic venous insufficiency 05/29/2020   Lymphedema 05/29/2020   CAD (coronary artery disease) 05/29/2020   Cervical lymphadenitis 04/18/2020   Dysuria 03/25/2020   History of repair of hiatal hernia 02/02/2020   Hypercholesteremia 06/09/2019   Chronic neuropathic pain 07/31/2018   Thyroid  nodule 04/15/2018   Skin lesion of scalp 09/06/2017   Age-related osteoporosis without current pathological fracture 12/04/2016   Osteoarthritis of knee 07/17/2016   Meningioma (HCC) 01/18/2016   Family history of aortic stenosis 01/20/2015   Status post laparoscopic sleeve gastrectomy Dec 2014 06/08/2013   Class 3 severe obesity with serious comorbidity and body mass index (BMI) of 40.0 to 44.9 in adult 12/08/2012   Primary hypertension 10/11/2011   Varicose veins of bilateral lower extremities with other complications 06/18/2011   FIBROIDS, UTERUS 04/03/2007   Asthma, mild intermittent 04/03/2007   Past Medical History:  Diagnosis Date   ALLERGIC RHINITIS 04/03/2007   Allergy    Not sure   Anemia    ((Pt Qnr Sub: Denies at visit from 08/28/2021 with endocrinology))    Anginal pain (HCC)     Anxiety    Arthritis Not sure   Asthma    hx of years ago - no longer a problem    ASTHMA, PERSISTENT, MODERATE 04/03/2007   Back pain    Chest pain    Depression screen 01/08/2023   Dyspnea    with exertion   Dyspnea on exertion    a. 11/2007 Echo: EF 60%.   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 11/2011 48h Holter: RSR, rare PVC's, occas PAC's   Retinal tear of right eye 01/2016   Skin cancer    SOB (shortness of breath)    Thyroid  nodule 04/15/2018   Ventral hernia     Family History  Problem Relation Age of Onset   Breast cancer Mother    Diabetes Mother    Hypertension Mother    Kidney failure Mother    Heart disease Mother    Obesity Mother    Diabetes Father    Hypertension Father    Heart disease Father    Diabetes Other    Hypertension Other    Stroke Other    Hemophilia Other     Past Surgical History:  Procedure Laterality Date   ABDOMINAL HYSTERECTOMY  Think 1994   APPENDECTOMY  1996   ARTERY BIOPSY Left 03/21/2022   Procedure: BIOPSY TEMPORAL ARTERY;  Surgeon: Jackquelyn Mass, MD;  Location: [facility];  Service: Vascular;  Laterality: Left;   BIOPSY THYROID      08/2021   BREAST CYST EXCISION Right 1994   Benign   CARDIOVERSION N/A 11/12/2022   Procedure: CARDIOVERSION;  Surgeon: Devorah Fonder, MD;  Location: [facility];  Service: Cardiovascular;  Laterality: N/A;   CESAREAN  SECTION     x2   CHOLECYSTECTOMY  1995   COLONOSCOPY WITH PROPOFOL  N/A 05/09/2021   Procedure: COLONOSCOPY WITH PROPOFOL ;  Surgeon: Luke Salaam, MD;  Location: [facility];  Service: Gastroenterology;  Laterality: N/A;   ESOPHAGOGASTRODUODENOSCOPY (EGD) WITH PROPOFOL  N/A 11/02/2022   Procedure: ESOPHAGOGASTRODUODENOSCOPY (EGD) WITH PROPOFOL ;  Surgeon: Shane Darling, MD;  Location: [facility] ENDOSCOPY;  Service: Endoscopy;  Laterality: N/A;   EYE SURGERY  01/2016   Repair retinal tear   FOOT SURGERY  1995 / 1996   x2   HERNIA REPAIR     KNEE ARTHROSCOPY W/ MENISCAL REPAIR  07/25/2013   right knee Dr. Agatha Horsfall   LAPAROSCOPIC GASTRIC SLEEVE RESECTION N/A 06/08/2013   Procedure: LAPAROSCOPIC GASTRIC SLEEVE RESECTION AND EXCISION OF SEBACEUS CYST FROM MID CHEST takedown of incarcerated ventral hernia and primary repair endoscopy;  Surgeon: Azucena Bollard, MD;  Location: [facility];  Service: General;  Laterality: N/A;   LAPAROSCOPIC NISSEN FUNDOPLICATION N/A 02/02/2020   Procedure: LAPAROSCOPIC REPAIR LARGE SYMPTOMATIC HIATAL HERNIA WITH UPPER ENDOSCOPY;  Surgeon: Jacolyn Matar, MD;  Location: [facility];  Service: General;  Laterality: N/A;   moles  06/2013   removed 2 moles from under arm and  lowback   NECK SURGERY     occipital nerve damage- injections    OVARIAN CYST REMOVAL  1970   PILONIDAL CYST EXCISION  1975   RADIOACTIVE SEED GUIDED EXCISIONAL BREAST BIOPSY Right 06/23/2021   Procedure: RADIOACTIVE SEED GUIDED EXCISIONAL RIGHT BREAST BIOPSY;  Surgeon: Jacolyn Matar, MD;  Location: [facility];  Service: General;  Laterality: Right;   RETINAL TEAR REPAIR CRYOTHERAPY Right 01/2016   [place]   SKIN CANCER EXCISION     TEE WITHOUT  CARDIOVERSION N/A 11/12/2022   Procedure: TRANSESOPHAGEAL ECHOCARDIOGRAM;  Surgeon: Devorah Fonder, MD;  Location: [facility];  Service: Cardiovascular;  Laterality: N/A;   THYROID  LOBECTOMY Left 08/21/2022   Procedure: LEFT THYROID  LOBECTOMY;   Surgeon: Oralee Billow, MD;  Location: [facility];  Service: General;  Laterality: Left;   THYROIDECTOMY, PARTIAL     TONSILLECTOMY     TUBAL LIGATION  Think 1994   UPPER GI ENDOSCOPY  06/08/2013   Procedure: UPPER GI ENDOSCOPY;  Surgeon: Azucena Bollard, MD;  Location: [facility];  Service: General;;   US  ECHOCARDIOGRAPHY  12/2011   WNL - EF 55-60%, mild MR, grade 1 diastolic dysnfiction (mild)   VENTRAL HERNIA REPAIR  2000   Social History   Occupational History   Occupation: Magazine features editor: [employer]   Occupation: Retired Runner, broadcasting/film/video  Tobacco Use   Smoking status: Never   Smokeless tobacco: Never   Tobacco comments:    smoke at age [age]  Vaping Use   Vaping status: Never Used  Substance and Sexual Activity   Alcohol use: Not Currently   Drug use: No   Sexual activity: Not Currently    Partners: Male    Birth control/protection: Abstinence, Pill

## 2023-10-28 ENCOUNTER — Telehealth: Payer: Self-pay | Admitting: Pharmacy Technician

## 2023-10-28 ENCOUNTER — Other Ambulatory Visit (HOSPITAL_COMMUNITY): Payer: Self-pay

## 2023-10-28 NOTE — Telephone Encounter (Signed)
 Pharmacy Patient Advocate Encounter   Received notification from Fax that prior authorization for spironolactone  is required/requested.   Insurance verification completed.   The patient is insured through [employer] .   Per test claim: The current 10/28/23 day co-pay is, $6.48- 3 months.  No PA needed at this time. This test claim was processed through [facility]- copay amounts may vary at other pharmacies due to pharmacy/plan contracts, or as the patient moves through the different stages of their insurance plan.

## 2023-10-29 ENCOUNTER — Encounter (INDEPENDENT_AMBULATORY_CARE_PROVIDER_SITE_OTHER): Payer: Self-pay

## 2023-11-03 ENCOUNTER — Encounter: Payer: Self-pay | Admitting: Cardiology

## 2023-11-07 ENCOUNTER — Ambulatory Visit (INDEPENDENT_AMBULATORY_CARE_PROVIDER_SITE_OTHER): Admitting: Internal Medicine

## 2023-11-08 ENCOUNTER — Encounter: Payer: Self-pay | Admitting: Oncology

## 2023-11-08 ENCOUNTER — Encounter: Payer: Self-pay | Admitting: Family Medicine

## 2023-11-08 ENCOUNTER — Ambulatory Visit: Admitting: Family Medicine

## 2023-11-08 VITALS — BP 124/80 | HR 66 | Temp 97.4°F | Ht 66.0 in | Wt 267.1 lb

## 2023-11-08 DIAGNOSIS — N9089 Other specified noninflammatory disorders of vulva and perineum: Secondary | ICD-10-CM

## 2023-11-08 NOTE — Progress Notes (Signed)
 Patient ID: Kimberly Patton, female    DOB: 01/20/1956, 68 y.o.   MRN: [id number]  This visit was conducted in person.  BP 124/80   Pulse 66   Temp (!) 97.4 F (36.3 C) (Temporal)   Ht 5' 6 (1.676 m)   Wt 267 lb 2 oz (121.2 kg)   SpO2 99%   BMI 43.12 kg/m    CC:  Chief Complaint  Patient presents with   Labial Pain    Right    Subjective:   HPI: Kimberly Patton is a 68 y.o. female presenting on 11/08/2023 for Labial Pain (Right)   New onset right labial pain ongoing   3 days, worse today.  No bleeding.  Sore  and bumps inside right labia... more bumps than usual.  No ulcer. No vaginal discharge or itching.   Has been sweating working outside more.  Wears a liner.. has choric incontinence.  Hx of intertrigo   No dysuria, no fever.  No flu like symptoms.   No new exposures.  No new meds.      Relevant past medical, surgical, family and social history reviewed and updated as indicated. Interim medical history since our last visit reviewed. Allergies and medications reviewed and updated. Outpatient Medications Prior to Visit  Medication Sig Dispense Refill   aspirin  EC 81 MG tablet Take 81 mg by mouth daily. Swallow whole.     Calcium  Citrate-Vitamin D  (CITRACAL + D PO) Take 2 tablets by mouth daily.      Cholecalciferol (VITAMIN D3) 1.25 MG (50000 UT) CAPS Take 1 capsule (1.25 mg total) by mouth once a week. 12 capsule 0   Coenzyme Q10-Vitamin E  (QUNOL ULTRA COQ10) 100-150 MG-UNIT CAPS Take 100 mg by mouth daily.     Cyanocobalamin  (VITAMIN B-12) 2500 MCG SUBL Place 2,500 mcg under the tongue daily.     diltiazem  (CARDIZEM ) 30 MG tablet Take 30 mg by mouth 2 (two) times daily as needed.     esomeprazole (NEXIUM) 40 MG capsule Take 40 mg by mouth daily.     furosemide  (LASIX ) 20 MG tablet Take 1 tablet (20 mg total) by mouth daily as needed. 90 tablet 3   Lactobacillus Rhamnosus, GG, (CULTURELLE IMMUNITY SUPPORT PO) Take 1 capsule by mouth daily.      levalbuterol  (XOPENEX  HFA) 45 MCG/ACT inhaler Inhale 2 puffs into the lungs every 6 (six) hours as needed for wheezing. 1 each 2   Menaquinone-7 (VITAMIN K2  PO) Take 100 mg by mouth daily.     metFORMIN  (GLUCOPHAGE -XR) 500 MG 24 hr tablet Take 1 tablet (500 mg total) by mouth 2 (two) times daily with a meal. 180 tablet 0   methocarbamol  (ROBAXIN ) 500 MG tablet Take 500 mg by mouth at bedtime as needed for muscle spasms.     Multiple Vitamins-Minerals (CENTRUM SILVER 50+WOMEN PO) Take 1 tablet by mouth daily.     nitroGLYCERIN  (NITROSTAT ) 0.4 MG SL tablet Place 1 tablet (0.4 mg total) under the tongue every 5 (five) minutes as needed for chest pain. 25 tablet 3   Riboflavin  400 MG CAPS Take 400 mg by mouth daily.     rosuvastatin  (CRESTOR ) 10 MG tablet Take 1 tablet (10 mg total) by mouth daily. 90 tablet 3   spironolactone  (ALDACTONE ) 25 MG tablet Take 1 tablet (25 mg total) by mouth daily. 90 tablet 3   traMADol  (ULTRAM ) 50 MG tablet Take 1 tablet (50 mg total) by mouth every 6 (six) hours as needed.  12 tablet 0   TURMERIC PO Take 2 tablets by mouth daily. 500 mg  With Ginger 50 mg Gummies     verapamil  (CALAN -SR) 120 MG CR tablet Take 1 tablet (120 mg total) by mouth 2 (two) times daily. 180 tablet 3   apixaban  (ELIQUIS ) 5 MG TABS tablet Take 1 tablet (5 mg total) by mouth 2 (two) times daily. 60 tablet 11   DULoxetine (CYMBALTA) 30 MG capsule Take 30 mg by mouth daily.     Facility-Administered Medications Prior to Visit  Medication Dose Route Frequency Provider Last Rate Last Admin   albuterol  (PROVENTIL ) (2.5 MG/3ML) 0.083% nebulizer solution 2.5 mg  2.5 mg Nebulization Once Tamea Dedra CROME, MD         Per HPI unless specifically indicated in ROS section below Review of Systems  Constitutional:  Negative for fatigue and fever.  HENT:  Negative for congestion.   Eyes:  Negative for pain.  Respiratory:  Negative for cough and shortness of breath.   Cardiovascular:  Negative for  chest pain, palpitations and leg swelling.  Gastrointestinal:  Negative for abdominal pain.  Genitourinary:  Negative for dysuria and vaginal bleeding.  Musculoskeletal:  Negative for back pain.  Neurological:  Negative for syncope, light-headedness and headaches.  Psychiatric/Behavioral:  Negative for dysphoric mood.    Objective:  BP 124/80   Pulse 66   Temp (!) 97.4 F (36.3 C) (Temporal)   Ht 5' 6 (1.676 m)   Wt 267 lb 2 oz (121.2 kg)   SpO2 99%   BMI 43.12 kg/m   Wt Readings from Last 3 Encounters:  11/08/23 267 lb 2 oz (121.2 kg)  10/09/23 261 lb (118.4 kg)  10/02/23 264 lb 6.4 oz (119.9 kg)      Physical Exam Exam conducted with a chaperone present.  Constitutional:      General: She is not in acute distress.    Appearance: Normal appearance. She is well-developed. She is not ill-appearing or toxic-appearing.  HENT:     Head: Normocephalic.     Right Ear: Hearing, tympanic membrane, ear canal and external ear normal. Tympanic membrane is not erythematous, retracted or bulging.     Left Ear: Hearing, tympanic membrane, ear canal and external ear normal. Tympanic membrane is not erythematous, retracted or bulging.     Nose: No mucosal edema or rhinorrhea.     Right Sinus: No maxillary sinus tenderness or frontal sinus tenderness.     Left Sinus: No maxillary sinus tenderness or frontal sinus tenderness.     Mouth/Throat:     Pharynx: Uvula midline.   Eyes:     General: Lids are normal. Lids are everted, no foreign bodies appreciated.     Conjunctiva/sclera: Conjunctivae normal.     Pupils: Pupils are equal, round, and reactive to light.   Neck:     Thyroid : No thyroid  mass or thyromegaly.     Vascular: No carotid bruit.     Trachea: Trachea normal.   Cardiovascular:     Rate and Rhythm: Normal rate and regular rhythm.     Pulses: Normal pulses.     Heart sounds: Normal heart sounds, S1 normal and S2 normal. No murmur heard.    No friction rub. No gallop.   Pulmonary:     Effort: Pulmonary effort is normal. No tachypnea or respiratory distress.     Breath sounds: Normal breath sounds. No decreased breath sounds, wheezing, rhonchi or rales.  Abdominal:     General: Bowel  sounds are normal.     Palpations: Abdomen is soft.     Tenderness: There is no abdominal tenderness.  Genitourinary:    General: Normal vulva.     Exam position: Supine.     Labia:        Right: Rash present. No tenderness, lesion or injury.        Left: Rash present. No tenderness, lesion or injury.    Musculoskeletal:     Cervical back: Normal range of motion and neck supple.   Skin:    General: Skin is warm and dry.     Findings: No rash.   Neurological:     Mental Status: She is alert.   Psychiatric:        Mood and Affect: Mood is not anxious or depressed.        Speech: Speech normal.        Behavior: Behavior normal. Behavior is cooperative.        Thought Content: Thought content normal.        Judgment: Judgment normal.       Results for orders placed or performed in visit on 10/02/23  Iron and TIBC   Collection Time: 10/02/23  9:06 AM  Result Value Ref Range   Iron 84 28 - 170 ug/dL   TIBC 593 749 - 549 ug/dL   Saturation Ratios 21 10.4 - 31.8 %   UIBC 322 ug/dL  Ferritin   Collection Time: 10/02/23  9:06 AM  Result Value Ref Range   Ferritin 52 11 - 307 ng/mL  CBC (Cancer Center Only)   Collection Time: 10/02/23  9:06 AM  Result Value Ref Range   WBC Count 6.1 4.0 - 10.5 K/uL   RBC 4.47 3.87 - 5.11 MIL/uL   Hemoglobin 13.7 12.0 - 15.0 g/dL   HCT 58.9 63.9 - 53.9 %   MCV 91.7 80.0 - 100.0 fL   MCH 30.6 26.0 - 34.0 pg   MCHC 33.4 30.0 - 36.0 g/dL   RDW 87.3 88.4 - 84.4 %   Platelet Count 251 150 - 400 K/uL   nRBC 0.0 0.0 - 0.2 %   *Note: Due to a large number of results and/or encounters for the requested time period, some results have not been displayed. A complete set of results can be found in Results Review.    Assessment and  Plan  Labial irritation Assessment & Plan: Acute, minimal findings on exam.  Likely labial irritation from heat and sweat.  No clear sign of yeast or bacterial infection. Recommend vaginal lubrication and avoidance of additional medications/special wipes/douching etc.  She will follow-up if not improving as expected.     No follow-ups on file.   Greig Ring, MD

## 2023-11-10 ENCOUNTER — Encounter: Payer: Self-pay | Admitting: Family Medicine

## 2023-11-15 MED ORDER — NYSTATIN 100000 UNIT/ML MT SUSP: 5.0000 mL | Freq: Four times a day (QID) | OROMUCOSAL | 1 refills | Status: DC

## 2023-11-22 ENCOUNTER — Ambulatory Visit: Payer: Self-pay

## 2023-11-22 ENCOUNTER — Ambulatory Visit: Admitting: Family Medicine

## 2023-11-22 NOTE — Telephone Encounter (Signed)
 FYI Only or Action Required?: FYI only for provider  Patient was last seen in primary care on 11/08/2023 by Judithann Novas, MD. Called Nurse Triage reporting Mouth Injury. Symptoms began today. Interventions attempted: Nothing. Symptoms are: gradually worsening.  Triage Disposition: See PCP When Office is Open (Within 3 Days)  Patient/caregiver understands and will follow disposition?: No, wishes to speak with PCP  Copied from CRM [id number]. Topic: Clinical - Red Word Triage >> Nov 22, 2023 12:00 PM Kimberly Patton E wrote: Red Word that prompted transfer to Nurse Triage: patient has a slit/indentation  in tongue and a little gray and lips are burning  bottom lip is red Reason for Disposition  [1] White patches that stick to tongue or inner cheek AND [2] can be wiped off    Pt had white patches & was seen by PCP and given nystatin  drops to use.  Now pt is experiences additional mouth s/s  Answer Assessment - Initial Assessment Questions 1. SYMPTOM: What's the main symptom you're concerned about? (e.g., chapped lips, dry mouth, lump, sores)     Burning at tip of tongue & red, bottom lip is red, tongue has an indentation 2. ONSET: When did the    start?     today 3. PAIN: Is there any pain? If Yes, ask: How bad is it? (Scale: 1-10; mild, moderate, severe)   - MILD (1-3):  doesn't interfere with eating or normal activities   - MODERATE (4-7): interferes with eating some solids and normal activities   - SEVERE (8-10):  excruciating pain, interferes with most normal activities   - SEVERE DYSPHAGIA: can't swallow liquids, drooling     moderate 4. CAUSE: What do you think is causing the symptoms?     unknown 5. OTHER SYMPTOMS: Do you have any other symptoms? (e.g., fever, sore throat, toothache, swelling)     N/a 6. PREGNANCY: Is there any chance you are pregnant? When was your last menstrual period?     N/a    No appts with PCP today: offered an appointment with another provider and  patient refused.  Pt states to please send a message to see if she can be added to the schedule today.  Called [facility] and spoke with Haskell Linker to inquire about being added to schedule and was instructed to send CRM  & someone would call the patient back.  Protocols used: Mouth Symptoms-A-AH

## 2023-11-22 NOTE — Telephone Encounter (Signed)
 Pt will be traveling next couple of days I offered to put he with another provider pt said no Bedsole knows what's going on with me I wait until  she come back

## 2023-11-24 DIAGNOSIS — K146 Glossodynia: Secondary | ICD-10-CM | POA: Diagnosis not present

## 2023-12-03 DIAGNOSIS — N9089 Other specified noninflammatory disorders of vulva and perineum: Secondary | ICD-10-CM | POA: Insufficient documentation

## 2023-12-03 NOTE — Assessment & Plan Note (Signed)
 Acute, minimal findings on exam.  Likely labial irritation from heat and sweat.  No clear sign of yeast or bacterial infection. Recommend vaginal lubrication and avoidance of additional medications/special wipes/douching etc.  She will follow-up if not improving as expected.

## 2023-12-05 ENCOUNTER — Encounter (INDEPENDENT_AMBULATORY_CARE_PROVIDER_SITE_OTHER): Payer: Self-pay | Admitting: Internal Medicine

## 2023-12-05 ENCOUNTER — Ambulatory Visit (INDEPENDENT_AMBULATORY_CARE_PROVIDER_SITE_OTHER)

## 2023-12-05 ENCOUNTER — Telehealth (INDEPENDENT_AMBULATORY_CARE_PROVIDER_SITE_OTHER): Payer: Self-pay | Admitting: Nurse Practitioner

## 2023-12-05 ENCOUNTER — Ambulatory Visit (INDEPENDENT_AMBULATORY_CARE_PROVIDER_SITE_OTHER): Admitting: Internal Medicine

## 2023-12-05 ENCOUNTER — Other Ambulatory Visit (INDEPENDENT_AMBULATORY_CARE_PROVIDER_SITE_OTHER): Payer: Self-pay | Admitting: Orthopaedic Surgery

## 2023-12-05 VITALS — BP 106/68 | HR 60 | Temp 98.0°F | Ht 66.0 in | Wt 267.0 lb

## 2023-12-05 DIAGNOSIS — I4891 Unspecified atrial fibrillation: Secondary | ICD-10-CM

## 2023-12-05 DIAGNOSIS — M79604 Pain in right leg: Secondary | ICD-10-CM

## 2023-12-05 DIAGNOSIS — M179 Osteoarthritis of knee, unspecified: Secondary | ICD-10-CM | POA: Diagnosis not present

## 2023-12-05 DIAGNOSIS — R7303 Prediabetes: Secondary | ICD-10-CM

## 2023-12-05 DIAGNOSIS — M7989 Other specified soft tissue disorders: Secondary | ICD-10-CM

## 2023-12-05 DIAGNOSIS — I2510 Atherosclerotic heart disease of native coronary artery without angina pectoris: Secondary | ICD-10-CM

## 2023-12-05 DIAGNOSIS — R609 Edema, unspecified: Secondary | ICD-10-CM | POA: Diagnosis not present

## 2023-12-05 DIAGNOSIS — Z6841 Body Mass Index (BMI) 40.0 and over, adult: Secondary | ICD-10-CM

## 2023-12-05 DIAGNOSIS — Z9884 Bariatric surgery status: Secondary | ICD-10-CM | POA: Diagnosis not present

## 2023-12-05 DIAGNOSIS — E66813 Obesity, class 3: Secondary | ICD-10-CM | POA: Diagnosis not present

## 2023-12-05 DIAGNOSIS — I10 Essential (primary) hypertension: Secondary | ICD-10-CM | POA: Diagnosis not present

## 2023-12-05 DIAGNOSIS — I890 Lymphedema, not elsewhere classified: Secondary | ICD-10-CM

## 2023-12-05 MED ORDER — METFORMIN HCL ER 500 MG PO TB24: 500.0000 mg | ORAL_TABLET | Freq: Two times a day (BID) | ORAL | 0 refills | Status: DC

## 2023-12-05 NOTE — Telephone Encounter (Signed)
 Pt came in stating this appt was workers comp. Pt was informed [facility] does not not see any patients filed under workers comp. Pt verbalized understanding.

## 2023-12-05 NOTE — Progress Notes (Signed)
 Office: [phone]  /  Fax: [phone]  Weight Summary and Body Composition Analysis (BIA)  Vitals Temp: 98 F (36.7 C) BP: 106/68 Pulse Rate: 60 SpO2: 97 %   Anthropometric Measurements Height: 5' 6 (1.676 m) Weight: 267 lb (121.1 kg) BMI (Calculated): 43.12 Weight at Last Visit: 260 lb Weight Lost Since Last Visit: 0 lb Weight Gained Since Last Visit: 7 lb Starting Weight: 275 lb Total Weight Loss (lbs): 15 lb (6.804 kg) Peak Weight: 320 lb   Body Composition  Body Fat %: 37 % Fat Mass (lbs): 99 lbs Muscle Mass (lbs): 160 lbs Total Body Water (lbs): 99.8 lbs Visceral Fat Rating : 14    RMR: 1627  Today's Visit #: 11  Starting Date: 01/08/23   Subjective   Chief Complaint: Obesity  Interval History Patient presents today for medical weight management she has a history of gastric sleeve surgery with weight regain associated conditions include hypertension, chronic venous insufficiency, coronary artery calcifications without history of MI, atrial fibrillation, prediabetes, asthma, biomechanical complications including advanced osteoarthritis of the knees with impaired mobility and quality of life.  Since last office visit she has gained 7 pounds she reports following her 1200-calorie nutrition plan about 50% of the time she reports tracking calories eating more whole foods maintaining adequate hydration denies skipping meals.  She is exercising 3 to 4 days a week about 15 to 20 minutes.  Her insurance does not cover GLP-1 treatment.  She also is not interested in topiramate and has contraindications to sympathomimetics.  She inquires about the multiple over-the-counter supplements claiming to boost metabolic rate.  Patient has been advised to avoid such supplements.  Challenges affecting patient progress: low volume of physical activity at present , orthopedic problems, medical conditions or chronic pain affecting mobility, slow metabolism for age,  menopause, and history of gastric sleeve with metabolic adaptations.    Pharmacotherapy for weight management: She is currently taking Metformin  (off label use for incretin effect and / or insulin  resistance and / or diabetes prevention) with adequate clinical response  and without side effects..   Assessment and Plan   Treatment Plan For Obesity:  Recommended Dietary Goals  Bertine is currently in the action stage of change. As such, her goal is to continue weight management plan. She has agreed to: continue current plan  Behavioral Health and Counseling  We discussed the following behavioral modification strategies today: continue to work on maintaining a reduced calorie state, getting the recommended amount of protein, incorporating whole foods, making healthy choices, staying well hydrated and practicing mindfulness when eating..  Additional education and resources provided today: None  Recommended Physical Activity Goals  Arelie has been advised to work up to 150 minutes of moderate intensity aerobic activity a week and strengthening exercises 2-3 times per week for cardiovascular health, weight loss maintenance and preservation of muscle mass.   She has agreed to :  Think about enjoyable ways to increase daily physical activity and overcoming barriers to exercise and Increase physical activity in their day and reduce sedentary time (increase NEAT).  Medical Interventions and Pharmacotherapy  We discussed various medication options to help Jasper with her weight loss efforts and we both agreed to : Unfortunately her insurance does not offer coverage for GLP-1 regardless of comorbidities.  She has contraindications to sympathomimetics and is not interested in trying topiramate as a single agent.  She had gastric sleeve surgery in 2014 with weight regain and considering degree of obesity and body fat  percentage of  53% with impaired mobility will be very difficult for her to reach  her goal with lifestyle changes alone.  She would benefit from antiobesity pharmacotherapy and/or medical intervention.  We will look into coverage for endoscopic gastroplasty as she is not interested in having a revision to Roux-en-Y  Associated Conditions Impacted by Obesity Treatment  Primary hypertension  Prediabetes -     metFORMIN  HCl ER; Take 1 tablet (500 mg total) by mouth 2 (two) times daily with a meal.  Dispense: 180 tablet; Refill: 0  Class 3 severe obesity with serious comorbidity and body mass index (BMI) of 40.0 to 44.9 in adult  Status post laparoscopic sleeve gastrectomy Dec 2014     Assessment and Plan    Obesity BMI of 43 and body fat percentage of 53% indicate significant obesity. She has gained seven pounds since April. Comorbidities include prediabetes, osteoarthritis, atrial fibrillation, lymphedema, and venous insufficiency. History of sleeve gastrectomy in December 2014. Discussed weight management challenges, including medication limitations due to medical conditions and insurance coverage. Explained potential benefits of GLP-1 agonists, noting cost and long-term commitment. Discussed endoscopic gastroplasty as a less invasive surgical option for weight regain, involving stomach suturing from within. Outcomes vary, and Medicare coverage is uncertain as procedures are considered experimental. - Continue current nutrition and exercise regimen. - Investigate Medicare coverage for endoscopic gastroplasty. - Regroup in four weeks to discuss findings and potential next steps.  Prediabetes Currently on metformin . Discussed importance of weight management in controlling prediabetes. - Continue metformin  1 tablet twice a day.  Osteoarthritis Osteoarthritis of the knee affects her ability to exercise, contributing to weight management challenges.  Lymphedema and Lipedema Lymphedema and possibly lipedema contribute to venous insufficiency. Explained compression's role  in managing venous insufficiency and potential overlap with lipedema. Discussed importance of weight loss in reducing pressure on lymphatic and venous systems.  Atrial Fibrillation She has been off anticoagulants for over a year and a half without episodes, likely due to avoiding steroids. Discussed risks of stimulants or supplements exacerbating AFib.  Avoid use of over-the-counter supplements for weight loss  Coronary Artery Disease Minimal nonobstructive coronary artery disease with coronary calcium  score of 59 and minimal stenosis of LAD and RCA. Linked to obesity and comorbidities. Discussed importance of weight management to reduce cardiovascular risk.  She is on antiplatelet therapy, statin therapy.  Blood pressure well-controlled  General Health Maintenance Discussed importance of whole foods and prebiotics for gut health. Advised against unproven supplements claiming to boost metabolism, emphasizing no legal supplement can increase metabolic rate without risks. - Encourage consumption of whole foods and prebiotics such as [place] yogurt, kimchi, sauerkraut, and kombucha. - Avoid supplements claiming to boost metabolism due to potential risks.  Follow-up Plans to discuss feasibility of endoscopic procedures and insurance coverage. - Regroup in four weeks to discuss endoscopic procedure options and insurance coverage.          Objective   Physical Exam:  Blood pressure 106/68, pulse 60, temperature 98 F (36.7 C), height 5' 6 (1.676 m), weight 267 lb (121.1 kg), SpO2 97%. Body mass index is 43.09 kg/m.  General: She is overweight, cooperative, alert, well developed, and in no acute distress. PSYCH: Has normal mood, affect and thought process.   HEENT: EOMI, sclerae are anicteric. Lungs: Normal breathing effort, no conversational dyspnea. Extremities: No edema.  Neurologic: No gross sensory or motor deficits. No tremors or fasciculations noted.    Diagnostic Data  Reviewed:  BMET  Component Value Date/Time   NA 143 08/26/2023 0819   K 4.0 08/26/2023 0819   CL 101 08/26/2023 0819   CO2 24 08/26/2023 0819   GLUCOSE 78 08/26/2023 0819   GLUCOSE 83 05/15/2023 0721   BUN 27 08/26/2023 0819   CREATININE 0.63 08/26/2023 0819   CREATININE 0.57 01/01/2020 1623   CALCIUM  9.9 08/26/2023 0819   GFRNONAA >60 11/12/2022 0322   GFRAA >60 02/02/2020 1417   Lab Results  Component Value Date   HGBA1C 5.6 05/15/2023   HGBA1C  12/03/2007    5.5 (NOTE)   The ADA recommends the following therapeutic goal for glycemic   control related to Hgb A1C measurement:   Goal of Therapy:   < 7.0% Hgb A1C   Reference: American Diabetes Association: Clinical Practice   Recommendations 2008, Diabetes Care,  2008, 31:(Suppl 1).   Lab Results  Component Value Date   INSULIN  11.9 01/08/2023   Lab Results  Component Value Date   TSH 1.69 05/15/2023   CBC    Component Value Date/Time   WBC 6.1 10/02/2023 0906   WBC 6.7 05/15/2023 0721   RBC 4.47 10/02/2023 0906   HGB 13.7 10/02/2023 0906   HCT 41.0 10/02/2023 0906   PLT 251 10/02/2023 0906   MCV 91.7 10/02/2023 0906   MCH 30.6 10/02/2023 0906   MCHC 33.4 10/02/2023 0906   RDW 12.6 10/02/2023 0906   Iron Studies    Component Value Date/Time   IRON 84 10/02/2023 0906   TIBC 406 10/02/2023 0906   FERRITIN 52 10/02/2023 0906   IRONPCTSAT 21 10/02/2023 0906   IRONPCTSAT 5 (L) 05/28/2017 1647   Lipid Panel     Component Value Date/Time   CHOL 161 05/15/2023 0721   CHOL 168 07/31/2021 0905   TRIG 124.0 05/15/2023 0721   HDL 56.20 05/15/2023 0721   HDL 67 07/31/2021 0905   CHOLHDL 3 05/15/2023 0721   VLDL 24.8 05/15/2023 0721   LDLCALC 80 05/15/2023 0721   LDLCALC 85 07/31/2021 0905   Hepatic Function Panel     Component Value Date/Time   PROT 7.2 05/15/2023 0721   ALBUMIN 4.3 05/15/2023 0721   AST 16 05/15/2023 0721   ALT 14 05/15/2023 0721   ALKPHOS 81 05/15/2023 0721   BILITOT 0.5 05/15/2023  0721   BILIDIR 0.1 12/11/2019 0852   IBILI 0.6 12/11/2019 0852      Component Value Date/Time   TSH 1.69 05/15/2023 0721   Nutritional Lab Results  Component Value Date   VD25OH 54.40 03/14/2022   VD25OH 33.36 09/30/2017   VD25OH 32.96 06/12/2017    Medications: Outpatient Encounter Medications as of 12/05/2023  Medication Sig   aspirin  EC 81 MG tablet Take 81 mg by mouth daily. Swallow whole.   Calcium  Citrate-Vitamin D  (CITRACAL + D PO) Take 2 tablets by mouth daily.    Cholecalciferol (VITAMIN D3) 1.25 MG (50000 UT) CAPS Take 1 capsule (1.25 mg total) by mouth once a week.   Coenzyme Q10-Vitamin E  (QUNOL ULTRA COQ10) 100-150 MG-UNIT CAPS Take 100 mg by mouth daily.   Cyanocobalamin  (VITAMIN B-12) 2500 MCG SUBL Place 2,500 mcg under the tongue daily.   diltiazem  (CARDIZEM ) 30 MG tablet Take 30 mg by mouth 2 (two) times daily as needed.   esomeprazole (NEXIUM) 40 MG capsule Take 40 mg by mouth daily.   furosemide  (LASIX ) 20 MG tablet Take 1 tablet (20 mg total) by mouth daily as needed.   Lactobacillus Rhamnosus, GG, (CULTURELLE IMMUNITY SUPPORT  PO) Take 1 capsule by mouth daily.   levalbuterol  (XOPENEX  HFA) 45 MCG/ACT inhaler Inhale 2 puffs into the lungs every 6 (six) hours as needed for wheezing.   Menaquinone-7 (VITAMIN K2  PO) Take 100 mg by mouth daily.   methocarbamol  (ROBAXIN ) 500 MG tablet Take 500 mg by mouth at bedtime as needed for muscle spasms.   Multiple Vitamins-Minerals (CENTRUM SILVER 50+WOMEN PO) Take 1 tablet by mouth daily.   nitroGLYCERIN  (NITROSTAT ) 0.4 MG SL tablet Place 1 tablet (0.4 mg total) under the tongue every 5 (five) minutes as needed for chest pain.   Riboflavin  400 MG CAPS Take 400 mg by mouth daily.   rosuvastatin  (CRESTOR ) 10 MG tablet Take 1 tablet (10 mg total) by mouth daily.   spironolactone  (ALDACTONE ) 25 MG tablet Take 1 tablet (25 mg total) by mouth daily.   traMADol  (ULTRAM ) 50 MG tablet Take 1 tablet (50 mg total) by mouth every 6 (six)  hours as needed.   TURMERIC PO Take 2 tablets by mouth daily. 500 mg  With Ginger 50 mg Gummies   verapamil  (CALAN -SR) 120 MG CR tablet Take 1 tablet (120 mg total) by mouth 2 (two) times daily.   [DISCONTINUED] metFORMIN  (GLUCOPHAGE -XR) 500 MG 24 hr tablet Take 1 tablet (500 mg total) by mouth 2 (two) times daily with a meal.   metFORMIN  (GLUCOPHAGE -XR) 500 MG 24 hr tablet Take 1 tablet (500 mg total) by mouth 2 (two) times daily with a meal.   [DISCONTINUED] nystatin  (MYCOSTATIN ) 100000 UNIT/ML suspension Take 5 mLs (500,000 Units total) by mouth 4 (four) times daily.   Facility-Administered Encounter Medications as of 12/05/2023  Medication   albuterol  (PROVENTIL ) (2.5 MG/3ML) 0.083% nebulizer solution 2.5 mg     Follow-Up   Return in about 4 weeks (around 01/02/2024) for For Weight Mangement with Dr. Francyne.SABRA She was informed of the importance of frequent follow up visits to maximize her success with intensive lifestyle modifications for her multiple health conditions.  Attestation Statement   Reviewed by clinician on day of visit: allergies, medications, problem list, medical history, surgical history, family history, social history, and previous encounter notes.   I have spent 40 minutes in the care of the patient today including: 4 minutes before the visit reviewing and preparing the chart. 28 minutes face-to-face assessing and reviewing listed medical problems as outlined in obesity care plan, providing nutritional and behavioral counseling on topics outlined in the obesity care plan, independently interpreting test results and goals of care, as described in assessment and plan, reviewing and discussing biometric information and progress, and ordering medications - see orders 8 minutes after the visit updating chart and documentation of encounter.    Lucas Francyne, MD

## 2023-12-07 ENCOUNTER — Other Ambulatory Visit: Payer: Self-pay | Admitting: Family Medicine

## 2023-12-09 NOTE — Telephone Encounter (Signed)
 Last office visit 11/08/2023 for labial irritation.  Last refilled 09/17/23 for #12 with no refills.  Vit D level 03/14/2022 normal at 54.40 ng/mL. Next Appt: No future appointments with PCP

## 2023-12-11 ENCOUNTER — Other Ambulatory Visit: Payer: Self-pay | Admitting: Medical

## 2023-12-17 ENCOUNTER — Ambulatory Visit: Payer: Self-pay | Admitting: Family Medicine

## 2023-12-17 ENCOUNTER — Ambulatory Visit (INDEPENDENT_AMBULATORY_CARE_PROVIDER_SITE_OTHER): Admitting: Family Medicine

## 2023-12-17 ENCOUNTER — Encounter: Payer: Self-pay | Admitting: Family Medicine

## 2023-12-17 VITALS — BP 110/70 | HR 76 | Temp 97.5°F | Ht 66.0 in | Wt 267.0 lb

## 2023-12-17 DIAGNOSIS — K146 Glossodynia: Secondary | ICD-10-CM | POA: Insufficient documentation

## 2023-12-17 LAB — COMPREHENSIVE METABOLIC PANEL WITH GFR
ALT: 15 U/L (ref 0–35)
AST: 17 U/L (ref 0–37)
Albumin: 4.5 g/dL (ref 3.5–5.2)
Alkaline Phosphatase: 79 U/L (ref 39–117)
BUN: 24 mg/dL — ABNORMAL HIGH (ref 6–23)
CO2: 33 meq/L — ABNORMAL HIGH (ref 19–32)
Calcium: 9.9 mg/dL (ref 8.4–10.5)
Chloride: 100 meq/L (ref 96–112)
Creatinine, Ser: 0.6 mg/dL (ref 0.40–1.20)
GFR: 92.41 mL/min (ref >60.00)
Glucose, Bld: 87 mg/dL (ref 70–99)
Potassium: 4.4 meq/L (ref 3.5–5.1)
Sodium: 139 meq/L (ref 135–145)
Total Bilirubin: 0.4 mg/dL (ref 0.2–1.2)
Total Protein: 7 g/dL (ref 6.0–8.3)

## 2023-12-17 LAB — VITAMIN B12: Vitamin B-12: 982 pg/mL — ABNORMAL HIGH (ref 211–911)

## 2023-12-17 LAB — CBC WITH DIFFERENTIAL/PLATELET
Basophils Absolute: 0 K/uL (ref 0.0–0.1)
Basophils Relative: 0.5 % (ref 0.0–3.0)
Eosinophils Absolute: 0.1 K/uL (ref 0.0–0.7)
Eosinophils Relative: 2 % (ref 0.0–5.0)
HCT: 42.6 % (ref 36.0–46.0)
Hemoglobin: 14.2 g/dL (ref 12.0–15.0)
Lymphocytes Relative: 25.9 % (ref 12.0–46.0)
Lymphs Abs: 1.7 K/uL (ref 0.7–4.0)
MCHC: 33.3 g/dL (ref 30.0–36.0)
MCV: 91.5 fl (ref 78.0–100.0)
Monocytes Absolute: 0.5 K/uL (ref 0.1–1.0)
Monocytes Relative: 7.4 % (ref 3.0–12.0)
Neutro Abs: 4.1 K/uL (ref 1.4–7.7)
Neutrophils Relative %: 64.2 % (ref 43.0–77.0)
Platelets: 284 K/uL (ref 150.0–400.0)
RBC: 4.66 Mil/uL (ref 3.87–5.11)
RDW: 13.3 % (ref 11.5–15.5)
WBC: 6.5 K/uL (ref 4.0–10.5)

## 2023-12-17 LAB — FOLATE: Folate: >23.4 ng/mL (ref >5.9)

## 2023-12-17 LAB — HEMOGLOBIN A1C: Hgb A1c MFr Bld: 5.5 % (ref 4.6–6.5)

## 2023-12-17 LAB — TSH: TSH: 1.53 u[IU]/mL (ref 0.35–5.50)

## 2023-12-17 LAB — IBC + FERRITIN
Ferritin: 30.4 ng/mL (ref 10.0–291.0)
Iron: 60 ug/dL (ref 42–145)
Saturation Ratios: 13.6 % — ABNORMAL LOW (ref 20.0–50.0)
TIBC: 439.6 ug/dL (ref 250.0–450.0)
Transferrin: 314 mg/dL (ref 212.0–360.0)

## 2023-12-17 LAB — SEDIMENTATION RATE: Sed Rate: 33 mm/h — ABNORMAL HIGH (ref 0–30)

## 2023-12-17 NOTE — Progress Notes (Signed)
 Patient ID: Kimberly Patton, female    DOB: 1955-11-15, 68 y.o.   MRN: [id number]  This visit was conducted in person.  BP 110/70   Pulse 76   Temp (!) 97.5 F (36.4 C) (Temporal)   Ht 5' 6 (1.676 m)   Wt 267 lb (121.1 kg)   SpO2 98%   BMI 43.09 kg/m    CC:  Chief Complaint  Patient presents with   Mouth Burning    Subjective:   HPI: Kimberly Patton is a 68 y.o. female presenting on 12/17/2023 for Mouth Burning   She has continued to note burning in mouth/tip of tongue ( lips burning,  tounge, top of throat burning ongoing x 2 months  Treated with nystatin  initial with white plaque.. this resolved but burning remained.    Seen at [facility]... treated with Magic Mouthwash (Maalox, benadryl , lidocaine )  She is currently dealing with GERD... treated with PPI.  GI did not feel it was like due to GI issues.  WBC 6.1 Nml iron panel in 4/23/205  08/2023 sed rate  Last 05/2023  TSH nml   No new meds.  Has appt upcoming at dentist.  Relevant past medical, surgical, family and social history reviewed and updated as indicated. Interim medical history since our last visit reviewed. Allergies and medications reviewed and updated. Outpatient Medications Prior to Visit  Medication Sig Dispense Refill   aspirin  EC 81 MG tablet Take 81 mg by mouth daily. Swallow whole.     Calcium  Citrate-Vitamin D  (CITRACAL + D PO) Take 2 tablets by mouth daily.      Cholecalciferol (VITAMIN D3) 1.25 MG (50000 UT) CAPS TAKE 1 CAPSULE BY MOUTH ONE TIME PER WEEK 12 capsule 0   Coenzyme Q10-Vitamin E  (QUNOL ULTRA COQ10) 100-150 MG-UNIT CAPS Take 100 mg by mouth daily.     Cyanocobalamin  (VITAMIN B-12) 2500 MCG SUBL Place 2,500 mcg under the tongue daily.     diltiazem  (CARDIZEM ) 30 MG tablet Take 30 mg by mouth 2 (two) times daily as needed.     DPH-Lido-AlHydr-MgHydr-Simeth (FIRST-MOUTHWASH BLM) SUSP 15 mLs.     esomeprazole (NEXIUM) 40 MG capsule Take 40 mg by mouth daily.     furosemide   (LASIX ) 20 MG tablet Take 1 tablet (20 mg total) by mouth daily as needed. 90 tablet 3   Lactobacillus Rhamnosus, GG, (CULTURELLE IMMUNITY SUPPORT PO) Take 1 capsule by mouth daily.     levalbuterol  (XOPENEX  HFA) 45 MCG/ACT inhaler Inhale 2 puffs into the lungs every 6 (six) hours as needed for wheezing. 1 each 2   Menaquinone-7 (VITAMIN K2  PO) Take 100 mg by mouth daily.     metFORMIN  (GLUCOPHAGE -XR) 500 MG 24 hr tablet Take 1 tablet (500 mg total) by mouth 2 (two) times daily with a meal. 180 tablet 0   methocarbamol  (ROBAXIN ) 500 MG tablet Take 500 mg by mouth at bedtime as needed for muscle spasms.     Multiple Vitamins-Minerals (CENTRUM SILVER 50+WOMEN PO) Take 1 tablet by mouth daily.     nitroGLYCERIN  (NITROSTAT ) 0.4 MG SL tablet Place 1 tablet (0.4 mg total) under the tongue every 5 (five) minutes as needed for chest pain. 25 tablet 3   Riboflavin  400 MG CAPS Take 400 mg by mouth daily.     rosuvastatin  (CRESTOR ) 10 MG tablet Take 1 tablet (10 mg total) by mouth daily. 90 tablet 3   spironolactone  (ALDACTONE ) 25 MG tablet Take 1 tablet (25 mg total) by mouth daily. 90  tablet 3   traMADol  (ULTRAM ) 50 MG tablet Take 1 tablet (50 mg total) by mouth every 6 (six) hours as needed. 12 tablet 0   TURMERIC PO Take 2 tablets by mouth daily. 500 mg  With Ginger 50 mg Gummies     verapamil  (CALAN -SR) 120 MG CR tablet TAKE 1 TABLET BY MOUTH 2 TIMES DAILY. 180 tablet 2   Facility-Administered Medications Prior to Visit  Medication Dose Route Frequency Provider Last Rate Last Admin   albuterol  (PROVENTIL ) (2.5 MG/3ML) 0.083% nebulizer solution 2.5 mg  2.5 mg Nebulization Once Tamea Dedra CROME, MD         Per HPI unless specifically indicated in ROS section below Review of Systems  Constitutional:  Negative for fatigue and fever.  HENT:  Negative for congestion.   Eyes:  Negative for pain.  Respiratory:  Negative for cough and shortness of breath.   Cardiovascular:  Negative for chest pain,  palpitations and leg swelling.  Gastrointestinal:  Negative for abdominal pain.  Genitourinary:  Negative for dysuria and vaginal bleeding.  Musculoskeletal:  Negative for back pain.  Neurological:  Negative for syncope, light-headedness and headaches.  Psychiatric/Behavioral:  Negative for dysphoric mood.    Objective:  BP 110/70   Pulse 76   Temp (!) 97.5 F (36.4 C) (Temporal)   Ht 5' 6 (1.676 m)   Wt 267 lb (121.1 kg)   SpO2 98%   BMI 43.09 kg/m   Wt Readings from Last 3 Encounters:  12/17/23 267 lb (121.1 kg)  12/05/23 267 lb (121.1 kg)  11/08/23 267 lb 2 oz (121.2 kg)      Physical Exam Constitutional:      General: She is not in acute distress.    Appearance: Normal appearance. She is well-developed. She is obese. She is not ill-appearing or toxic-appearing.  HENT:     Head: Normocephalic.     Right Ear: Hearing, tympanic membrane, ear canal and external ear normal. Tympanic membrane is not erythematous, retracted or bulging.     Left Ear: Hearing, tympanic membrane, ear canal and external ear normal. Tympanic membrane is not erythematous, retracted or bulging.     Nose: No mucosal edema or rhinorrhea.     Right Sinus: No maxillary sinus tenderness or frontal sinus tenderness.     Left Sinus: No maxillary sinus tenderness or frontal sinus tenderness.     Mouth/Throat:     Pharynx: Uvula midline.  Eyes:     General: Lids are normal. Lids are everted, no foreign bodies appreciated.     Conjunctiva/sclera: Conjunctivae normal.     Pupils: Pupils are equal, round, and reactive to light.  Neck:     Thyroid : No thyroid  mass or thyromegaly.     Vascular: No carotid bruit.     Trachea: Trachea normal.  Cardiovascular:     Rate and Rhythm: Normal rate and regular rhythm.     Pulses: Normal pulses.     Heart sounds: Normal heart sounds, S1 normal and S2 normal. No murmur heard.    No friction rub. No gallop.  Pulmonary:     Effort: Pulmonary effort is normal. No  tachypnea or respiratory distress.     Breath sounds: Normal breath sounds. No decreased breath sounds, wheezing, rhonchi or rales.  Abdominal:     General: Bowel sounds are normal.     Palpations: Abdomen is soft.     Tenderness: There is no abdominal tenderness.  Musculoskeletal:     Cervical back: Normal  range of motion and neck supple.  Skin:    General: Skin is warm and dry.     Findings: No rash.  Neurological:     Mental Status: She is alert.  Psychiatric:        Mood and Affect: Mood is not anxious or depressed.        Speech: Speech normal.        Behavior: Behavior normal. Behavior is cooperative.        Thought Content: Thought content normal.        Judgment: Judgment normal.       Results for orders placed or performed in visit on 10/02/23  Iron and TIBC   Collection Time: 10/02/23  9:06 AM  Result Value Ref Range   Iron 84 28 - 170 ug/dL   TIBC 593 749 - 549 ug/dL   Saturation Ratios 21 10.4 - 31.8 %   UIBC 322 ug/dL  Ferritin   Collection Time: 10/02/23  9:06 AM  Result Value Ref Range   Ferritin 52 11 - 307 ng/mL  CBC (Cancer Center Only)   Collection Time: 10/02/23  9:06 AM  Result Value Ref Range   WBC Count 6.1 4.0 - 10.5 K/uL   RBC 4.47 3.87 - 5.11 MIL/uL   Hemoglobin 13.7 12.0 - 15.0 g/dL   HCT 58.9 63.9 - 53.9 %   MCV 91.7 80.0 - 100.0 fL   MCH 30.6 26.0 - 34.0 pg   MCHC 33.4 30.0 - 36.0 g/dL   RDW 87.3 88.4 - 84.4 %   Platelet Count 251 150 - 400 K/uL   nRBC 0.0 0.0 - 0.2 %   *Note: Due to a large number of results and/or encounters for the requested time period, some results have not been displayed. A complete set of results can be found in Results Review.    Assessment and Plan  Burning mouth syndrome Assessment & Plan: Persistent, ongoing several months now without clear etiology.  No current thrush in mouth.  Will evaluate for possible secondary causes of burning mouth syndrome. Encouraged her to stop any supplements to see if they  could be causing her side effects.  No new medications.  Orders: -     CBC with Differential/Platelet -     IBC + Ferritin -     Comprehensive metabolic panel with GFR -     TSH -     Vitamin B12 -     Hemoglobin A1c -     Zinc -     Folate -     Sedimentation rate    No follow-ups on file.   Greig Ring, MD

## 2023-12-17 NOTE — Assessment & Plan Note (Signed)
 Persistent, ongoing several months now without clear etiology.  No current thrush in mouth.  Will evaluate for possible secondary causes of burning mouth syndrome. Encouraged her to stop any supplements to see if they could be causing her side effects.  No new medications.

## 2023-12-20 LAB — ZINC: Zinc: 80 ug/dL (ref 60–130)

## 2023-12-24 DIAGNOSIS — K2100 Gastro-esophageal reflux disease with esophagitis, without bleeding: Secondary | ICD-10-CM | POA: Diagnosis not present

## 2023-12-24 DIAGNOSIS — Z9889 Other specified postprocedural states: Secondary | ICD-10-CM | POA: Diagnosis not present

## 2023-12-24 DIAGNOSIS — K117 Disturbances of salivary secretion: Secondary | ICD-10-CM | POA: Diagnosis not present

## 2023-12-24 DIAGNOSIS — Z8719 Personal history of other diseases of the digestive system: Secondary | ICD-10-CM | POA: Diagnosis not present

## 2023-12-24 DIAGNOSIS — K146 Glossodynia: Secondary | ICD-10-CM | POA: Diagnosis not present

## 2023-12-24 DIAGNOSIS — K449 Diaphragmatic hernia without obstruction or gangrene: Secondary | ICD-10-CM | POA: Diagnosis not present

## 2023-12-24 DIAGNOSIS — R1013 Epigastric pain: Secondary | ICD-10-CM | POA: Diagnosis not present

## 2023-12-24 DIAGNOSIS — Z9884 Bariatric surgery status: Secondary | ICD-10-CM | POA: Diagnosis not present

## 2023-12-24 DIAGNOSIS — Z6841 Body Mass Index (BMI) 40.0 and over, adult: Secondary | ICD-10-CM | POA: Diagnosis not present

## 2023-12-26 ENCOUNTER — Other Ambulatory Visit: Payer: Self-pay | Admitting: Gastroenterology

## 2023-12-26 DIAGNOSIS — R1013 Epigastric pain: Secondary | ICD-10-CM

## 2023-12-26 DIAGNOSIS — Z8719 Personal history of other diseases of the digestive system: Secondary | ICD-10-CM

## 2023-12-26 DIAGNOSIS — K2100 Gastro-esophageal reflux disease with esophagitis, without bleeding: Secondary | ICD-10-CM

## 2023-12-26 DIAGNOSIS — K449 Diaphragmatic hernia without obstruction or gangrene: Secondary | ICD-10-CM

## 2023-12-26 DIAGNOSIS — K146 Glossodynia: Secondary | ICD-10-CM

## 2023-12-26 DIAGNOSIS — Z9884 Bariatric surgery status: Secondary | ICD-10-CM

## 2024-01-01 ENCOUNTER — Other Ambulatory Visit: Payer: Self-pay | Admitting: Gastroenterology

## 2024-01-01 ENCOUNTER — Ambulatory Visit
Admission: RE | Admit: 2024-01-01 | Discharge: 2024-01-01 | Disposition: A | Discharge status: Home / Self Care | Source: Ambulatory Visit | Attending: Gastroenterology | Admitting: Gastroenterology

## 2024-01-01 DIAGNOSIS — Z8719 Personal history of other diseases of the digestive system: Secondary | ICD-10-CM | POA: Diagnosis not present

## 2024-01-01 DIAGNOSIS — K224 Dyskinesia of esophagus: Secondary | ICD-10-CM | POA: Diagnosis not present

## 2024-01-01 DIAGNOSIS — Z9889 Other specified postprocedural states: Secondary | ICD-10-CM | POA: Insufficient documentation

## 2024-01-01 DIAGNOSIS — R1013 Epigastric pain: Secondary | ICD-10-CM | POA: Insufficient documentation

## 2024-01-01 DIAGNOSIS — K146 Glossodynia: Secondary | ICD-10-CM | POA: Insufficient documentation

## 2024-01-01 DIAGNOSIS — K449 Diaphragmatic hernia without obstruction or gangrene: Secondary | ICD-10-CM | POA: Insufficient documentation

## 2024-01-01 DIAGNOSIS — K2100 Gastro-esophageal reflux disease with esophagitis, without bleeding: Secondary | ICD-10-CM | POA: Insufficient documentation

## 2024-01-01 DIAGNOSIS — Z9884 Bariatric surgery status: Secondary | ICD-10-CM | POA: Insufficient documentation

## 2024-01-01 DIAGNOSIS — K219 Gastro-esophageal reflux disease without esophagitis: Secondary | ICD-10-CM | POA: Diagnosis not present

## 2024-01-01 DIAGNOSIS — K225 Diverticulum of esophagus, acquired: Secondary | ICD-10-CM | POA: Diagnosis not present

## 2024-01-07 ENCOUNTER — Encounter (INDEPENDENT_AMBULATORY_CARE_PROVIDER_SITE_OTHER): Payer: Self-pay | Admitting: Internal Medicine

## 2024-01-07 ENCOUNTER — Ambulatory Visit (INDEPENDENT_AMBULATORY_CARE_PROVIDER_SITE_OTHER): Admitting: Internal Medicine

## 2024-01-07 VITALS — BP 124/80 | HR 65 | Temp 97.7°F | Ht 66.0 in | Wt 266.0 lb

## 2024-01-07 DIAGNOSIS — R7303 Prediabetes: Secondary | ICD-10-CM | POA: Diagnosis not present

## 2024-01-07 DIAGNOSIS — Z9884 Bariatric surgery status: Secondary | ICD-10-CM

## 2024-01-07 DIAGNOSIS — E66813 Obesity, class 3: Secondary | ICD-10-CM

## 2024-01-07 DIAGNOSIS — Z6841 Body Mass Index (BMI) 40.0 and over, adult: Secondary | ICD-10-CM

## 2024-01-07 DIAGNOSIS — I2510 Atherosclerotic heart disease of native coronary artery without angina pectoris: Secondary | ICD-10-CM

## 2024-01-07 DIAGNOSIS — R948 Abnormal results of function studies of other organs and systems: Secondary | ICD-10-CM | POA: Diagnosis not present

## 2024-01-07 DIAGNOSIS — E7800 Pure hypercholesterolemia, unspecified: Secondary | ICD-10-CM | POA: Diagnosis not present

## 2024-01-07 DIAGNOSIS — I10 Essential (primary) hypertension: Secondary | ICD-10-CM | POA: Diagnosis not present

## 2024-01-07 MED ORDER — WEGOVY 0.25 MG/0.5ML ~~LOC~~ SOAJ: 0.2500 mg | SUBCUTANEOUS | 0 refills | Status: DC

## 2024-01-07 NOTE — Progress Notes (Signed)
 Office: [phone]  /  Fax: [phone]  Weight Summary and Body Composition Analysis (BIA)  Vitals Temp: 97.7 F (36.5 C) BP: 124/80 Pulse Rate: 65 SpO2: 98 %   Anthropometric Measurements Height: 5' 6 (1.676 m) Weight: 266 lb (120.7 kg) BMI (Calculated): 42.95 Weight at Last Visit: 267 lb Weight Lost Since Last Visit: 1 lb Weight Gained Since Last Visit: 0 lb Starting Weight: 275 lb Total Weight Loss (lbs): 9 lb (4.082 kg) Peak Weight: 320 lb   Body Composition  Body Fat %: 40.4 % Fat Mass (lbs): 107.4 lbs Muscle Mass (lbs): 150.6 lbs Total Body Water (lbs): 98.2 lbs Visceral Fat Rating : 15    RMR: 1627  Today's Visit #: 12  Starting Date: 01/08/23   Subjective   Chief Complaint: Obesity  Interval History Discussed the use of AI scribe software for clinical note transcription with the patient, who gave verbal consent to proceed.  History of Present Illness   Kimberly Patton is a 68 year old female with complex obesity who presents for medical weight management.  She has a history of complex obesity with previous gastric sleeve surgery and subsequent weight regain. Her current dietary intake is 1800 to 2000 calories per day, including 90 grams of protein, which she tracks through journaling.  This is above her target of 1200 to 1500 cal as she has a slower metabolic rate.  Her diet primarily consists of large salads with high-calorie dressings, cheese, bacon, and blueberries, and she is attempting to incorporate more fruits and vegetables. She engages in water aerobics three times a week and is increasing her outdoor activities.  She has asthma, prediabetes, hypercholesterolemia, and lymphedema. An indirect calorimetry test measured her metabolic rate at 8372 calories, which is lower than her current intake. She has not been on Wegovy  or Zepbound  due to insurance coverage issues.   She has minimal obstructive coronary artery disease identified on a CT  angiogram but has not experienced a heart attack. She has not been diagnosed with sleep apnea and feels rested upon waking, with no need for daytime naps.      Challenges affecting patient progress: difficulty maintaining a reduced calorie state, orthopedic problems, medical conditions or chronic pain affecting mobility, medical comorbidities, having difficulties with GLP-1 or AOM coverage, and history of gastric sleeve with associated metabolic adaptations.    Pharmacotherapy for weight management: She is currently taking Metformin  (off label use for incretin effect and / or insulin  resistance and / or diabetes prevention) with adequate clinical response  and without side effects..   Assessment and Plan   Treatment Plan For Obesity:  Recommended Dietary Goals  Kimberly Patton is currently in the action stage of change. As such, her goal is to continue weight management plan. She has agreed to: keep a food journal with a target of  1500 calories per day and 90-120 grams of protein per day or 30-40 grams per meal. and incorporate 1-2 meal replacements a day for convenience   Behavioral Health and Counseling  We discussed the following behavioral modification strategies today: work on tracking and journaling calories using tracking application and continue to work on maintaining a reduced calorie state, getting the recommended amount of protein, incorporating whole foods, making healthy choices, staying well hydrated and practicing mindfulness when eating..  Additional education and resources provided today: None  Recommended Physical Activity Goals  Kimberly Patton has been advised to work up to 150 minutes of moderate intensity aerobic activity a week and strengthening  exercises 2-3 times per week for cardiovascular health, weight loss maintenance and preservation of muscle mass.   She has agreed to :  continue to gradually increase the amount and intensity of exercise routine  Medical Interventions  and Pharmacotherapy  We discussed various medication options to help Kimberly Patton with her weight loss efforts and we both agreed to : Start anti-obesity medication.  In addition to reduced calorie nutrition plan (RCNP), behavioral strategies and physical activity, Kimberly Patton would benefit from pharmacotherapy to assist with hunger signals, satiety and cravings. This will reduce obesity-related health risks by inducing weight loss, and help reduce food consumption and adherence to Kimberly Patton) . It may also improve QOL by improving self-confidence and reduce the  setbacks associated with metabolic adaptations.  Patient has multiple high risk conditions associated with obesity she also has a history of gastric sleeve with metabolic adaptations.  Some conditions include coronary artery disease on CT angiography, history of atrial fibrillation, hypertension, venous insufficiency, lymphedema, asthma, prediabetes, hypercholesterolemia and has a body fat percentage of 40% with a visceral fat rating of 15.  Taken together these conditions and this he states increase her risk for malignancy and cardiovascular complications.  She also has significant biomechanical complications in the form of advanced osteoarthritis of her knees and also acid reflux.  Patient would benefit from treatment with GLP-1 as medication may help support her weight loss and reduce the risk associated with these conditions.  After discussion of treatment options, mechanisms of action, benefits, side effects, contraindications and shared decision making she is agreeable to starting Wegovy  0.25 mg once a week. Patient also made aware that medication is indicated for long-term management of obesity and the risk of weight regain following discontinuation of treatment and hence the importance of adhering to medical weight loss plan.  We demonstrated use of device and patient using teach back method was able to demonstrate proper technique.  Associated  Conditions Impacted by Obesity Treatment  Assessment & Plan Primary hypertension She is on verapamil  and spironolactone .  Blood pressure is well-controlled.  No side effects reported.  She will continue current regimen Coronary artery disease involving native coronary artery of native heart without angina pectoris She has nonobstructive CAD on CT angiogram.  She does not have any symptoms of angina or myocardial failure.  In addition to guideline based medical treatment she would benefit from GLP-1 due to degree of obesity and associated comorbidities.  She is high risk for cardiovascular complications. Status post laparoscopic sleeve gastrectomy Dec 2014 Presurgical weight 340 pounds, nadir 200 and a year and a half.  Started to regain weight in 2017 after divorce.  Her weight regain is multifactorial and likely secondary to behavioral and metabolic maladaptations following gastric bypass.  We discussed the possible need for revision considering degree of obesity and associated medical comorbidities. Class 3 severe obesity with serious comorbidity and body mass index (BMI) of 40.0 to 44.9 in adult Obesity with weight regain after gastric sleeve and abnormal metabolic rate.  Has lost 9 pounds on current weight management strategy.  Her caloric intake is off target and she received counseling on this today. Complex obesity with weight regain post-gastric sleeve and abnormal metabolic rate confirmed by indirect calorimetry. Current caloric intake exceeds metabolic rate. Insurance does not cover Wegovy  or Zepbound  which she would benefit tremendously from.  Roux-en-Y revision considered most effective but she is hesitant due to concerns about bypass surgery. - Adjust caloric intake to 1500 calories per day. - Track and journal  dietary intake to identify high-calorie components. - Consider meal replacements for convenience and calorie control. - Attempt to obtain insurance approval for Wegovy   - Explore  cash payment options for Wegovy  if insurance approval is not obtained, she may not qualify for discount program. - Follow up in four weeks to assess progress and discuss further options. Hypercholesteremia LDL is not at goal. Elevated LDL may be secondary to nutrition, genetics and spillover effect from excess adiposity. Recommended LDL goal is <70 to reduce the risk of fatty streaks and the progression to obstructive ASCVD in the future.   Her 10 year risk is: The 10-year ASCVD risk score (Arnett DK, et al., 2019) is: 8.8%  Lab Results  Component Value Date   CHOL 161 05/15/2023   HDL 56.20 05/15/2023   LDLCALC 80 05/15/2023   TRIG 124.0 05/15/2023   CHOLHDL 3 05/15/2023    Patient may benefit from increasing rosuvastatin  to 20 mg a day to lower her LDL cholesterol to less than 55 considering the presence of plaque buildup on CT angiogram.    Prediabetes Patient currently on metformin  twice daily for pharmacoprophylaxis without any adverse effects.  She will continue current medication.  We discussed the use of GLP-1 for pharmacoprophylaxis and also weight management.    Abnormal metabolism Patient has a slower than predicted metabolism. IC 1627 vs. calculated 2364.  This may contribute to weight gain, chronic fatigue and difficulty losing weight.  This is likely due to decrease muscle mass, metabolic adaptations following gastric bypass, multiple comorbid conditions.  She screened negative for OSA today.            Objective   Physical Exam:  Blood pressure 124/80, pulse 65, temperature 97.7 F (36.5 C), height 5' 6 (1.676 m), weight 266 lb (120.7 kg), SpO2 98%. Body mass index is 42.93 kg/m.  General: She is overweight, cooperative, alert, well developed, and in no acute distress. PSYCH: Has normal mood, affect and thought process.   HEENT: EOMI, sclerae are anicteric. Lungs: Normal breathing effort, no conversational dyspnea. Extremities: No edema.  Neurologic: No  gross sensory or motor deficits. No tremors or fasciculations noted.    Diagnostic Data Reviewed:  BMET    Component Value Date/Time   NA 139 12/17/2023 1005   NA 143 08/26/2023 0819   K 4.4 12/17/2023 1005   CL 100 12/17/2023 1005   CO2 33 (H) 12/17/2023 1005   GLUCOSE 87 12/17/2023 1005   BUN 24 (H) 12/17/2023 1005   BUN 27 08/26/2023 0819   CREATININE 0.60 12/17/2023 1005   CREATININE 0.57 01/01/2020 1623   CALCIUM  9.9 12/17/2023 1005   GFRNONAA >60 11/12/2022 0322   GFRAA >60 02/02/2020 1417   Lab Results  Component Value Date   HGBA1C 5.5 12/17/2023   HGBA1C  12/03/2007    5.5 (NOTE)   The ADA recommends the following therapeutic goal for glycemic   control related to Hgb A1C measurement:   Goal of Therapy:   < 7.0% Hgb A1C   Reference: American Diabetes Association: Clinical Practice   Recommendations 2008, Diabetes Care,  2008, 31:(Suppl 1).   Lab Results  Component Value Date   INSULIN  11.9 01/08/2023   Lab Results  Component Value Date   TSH 1.53 12/17/2023   CBC    Component Value Date/Time   WBC 6.5 12/17/2023 1005   RBC 4.66 12/17/2023 1005   HGB 14.2 12/17/2023 1005   HGB 13.7 10/02/2023 0906   HCT 42.6 12/17/2023 1005  PLT 284.0 12/17/2023 1005   PLT 251 10/02/2023 0906   MCV 91.5 12/17/2023 1005   MCH 30.6 10/02/2023 0906   MCHC 33.3 12/17/2023 1005   RDW 13.3 12/17/2023 1005   Iron Studies    Component Value Date/Time   IRON 60 12/17/2023 1005   TIBC 439.6 12/17/2023 1005   FERRITIN 30.4 12/17/2023 1005   IRONPCTSAT 13.6 (L) 12/17/2023 1005   IRONPCTSAT 5 (L) 05/28/2017 1647   Lipid Panel     Component Value Date/Time   CHOL 161 05/15/2023 0721   CHOL 168 07/31/2021 0905   TRIG 124.0 05/15/2023 0721   HDL 56.20 05/15/2023 0721   HDL 67 07/31/2021 0905   CHOLHDL 3 05/15/2023 0721   VLDL 24.8 05/15/2023 0721   LDLCALC 80 05/15/2023 0721   LDLCALC 85 07/31/2021 0905   Hepatic Function Panel     Component Value Date/Time   PROT  7.0 12/17/2023 1005   ALBUMIN 4.5 12/17/2023 1005   AST 17 12/17/2023 1005   ALT 15 12/17/2023 1005   ALKPHOS 79 12/17/2023 1005   BILITOT 0.4 12/17/2023 1005   BILIDIR 0.1 12/11/2019 0852   IBILI 0.6 12/11/2019 0852      Component Value Date/Time   TSH 1.53 12/17/2023 1005   Nutritional Lab Results  Component Value Date   VD25OH 54.40 03/14/2022   VD25OH 33.36 09/30/2017   VD25OH 32.96 06/12/2017    Medications: Outpatient Encounter Medications as of 01/07/2024  Medication Sig   aspirin  EC 81 MG tablet Take 81 mg by mouth daily. Swallow whole.   Calcium  Citrate-Vitamin D  (CITRACAL + D PO) Take 2 tablets by mouth daily.    Cholecalciferol (VITAMIN D3) 1.25 MG (50000 UT) CAPS TAKE 1 CAPSULE BY MOUTH ONE TIME PER WEEK   Coenzyme Q10-Vitamin E  (QUNOL ULTRA COQ10) 100-150 MG-UNIT CAPS Take 100 mg by mouth daily.   Cyanocobalamin  (VITAMIN B-12) 2500 MCG SUBL Place 2,500 mcg under the tongue daily.   diltiazem  (CARDIZEM ) 30 MG tablet Take 30 mg by mouth 2 (two) times daily as needed.   DPH-Lido-AlHydr-MgHydr-Simeth (FIRST-MOUTHWASH BLM) SUSP 15 mLs.   esomeprazole (NEXIUM) 40 MG capsule Take 40 mg by mouth daily.   furosemide  (LASIX ) 20 MG tablet Take 1 tablet (20 mg total) by mouth daily as needed.   Lactobacillus Rhamnosus, GG, (CULTURELLE IMMUNITY SUPPORT PO) Take 1 capsule by mouth daily.   levalbuterol  (XOPENEX  HFA) 45 MCG/ACT inhaler Inhale 2 puffs into the lungs every 6 (six) hours as needed for wheezing.   Menaquinone-7 (VITAMIN K2  PO) Take 100 mg by mouth daily.   metFORMIN  (GLUCOPHAGE -XR) 500 MG 24 hr tablet Take 1 tablet (500 mg total) by mouth 2 (two) times daily with a meal.   methocarbamol  (ROBAXIN ) 500 MG tablet Take 500 mg by mouth at bedtime as needed for muscle spasms.   Multiple Vitamins-Minerals (CENTRUM SILVER 50+WOMEN PO) Take 1 tablet by mouth daily.   nitroGLYCERIN  (NITROSTAT ) 0.4 MG SL tablet Place 1 tablet (0.4 mg total) under the tongue every 5 (five)  minutes as needed for chest pain.   Riboflavin  400 MG CAPS Take 400 mg by mouth daily.   rosuvastatin  (CRESTOR ) 10 MG tablet Take 1 tablet (10 mg total) by mouth daily.   spironolactone  (ALDACTONE ) 25 MG tablet Take 1 tablet (25 mg total) by mouth daily.   traMADol  (ULTRAM ) 50 MG tablet Take 1 tablet (50 mg total) by mouth every 6 (six) hours as needed.   TURMERIC PO Take 2 tablets by mouth daily.  500 mg  With Ginger 50 mg Gummies   verapamil  (CALAN -SR) 120 MG CR tablet TAKE 1 TABLET BY MOUTH 2 TIMES DAILY.   Facility-Administered Encounter Medications as of 01/07/2024  Medication   albuterol  (PROVENTIL ) (2.5 MG/3ML) 0.083% nebulizer solution 2.5 mg     Follow-Up   Return in about 4 weeks (around 02/04/2024) for For Weight Mangement with Dr. Francyne.SABRA She was informed of the importance of frequent follow up visits to maximize her success with intensive lifestyle modifications for her multiple health conditions.  Attestation Statement   Reviewed by clinician on day of visit: allergies, medications, problem list, medical history, surgical history, family history, social history, and previous encounter notes.     Lucas Francyne, MD

## 2024-01-07 NOTE — Assessment & Plan Note (Signed)
 She is on verapamil  and spironolactone .  Blood pressure is well-controlled.  No side effects reported.  She will continue current regimen

## 2024-01-07 NOTE — Assessment & Plan Note (Signed)
 Patient currently on metformin  twice daily for pharmacoprophylaxis without any adverse effects.  She will continue current medication.  We discussed the use of GLP-1 for pharmacoprophylaxis and also weight management.

## 2024-01-07 NOTE — Assessment & Plan Note (Signed)
 Obesity with weight regain after gastric sleeve and abnormal metabolic rate.  Has lost 9 pounds on current weight management strategy.  Her caloric intake is off target and she received counseling on this today. Complex obesity with weight regain post-gastric sleeve and abnormal metabolic rate confirmed by indirect calorimetry. Current caloric intake exceeds metabolic rate. Insurance does not cover Wegovy  or Zepbound  which she would benefit tremendously from.  Roux-en-Y revision considered most effective but she is hesitant due to concerns about bypass surgery. - Adjust caloric intake to 1500 calories per day. - Track and journal dietary intake to identify high-calorie components. - Consider meal replacements for convenience and calorie control. - Attempt to obtain insurance approval for Wegovy   - Explore cash payment options for Wegovy  if insurance approval is not obtained, she may not qualify for discount program. - Follow up in four weeks to assess progress and discuss further options.

## 2024-01-07 NOTE — Assessment & Plan Note (Signed)
 Presurgical weight 340 pounds, nadir 200 and a year and a half.  Started to regain weight in 2017 after divorce.  Her weight regain is multifactorial and likely secondary to behavioral and metabolic maladaptations following gastric bypass.  We discussed the possible need for revision considering degree of obesity and associated medical comorbidities.

## 2024-01-07 NOTE — Assessment & Plan Note (Signed)
 Patient has a slower than predicted metabolism. IC 1627 vs. calculated 2364.  This may contribute to weight gain, chronic fatigue and difficulty losing weight.  This is likely due to decrease muscle mass, metabolic adaptations following gastric bypass, multiple comorbid conditions.  She screened negative for OSA today.

## 2024-01-07 NOTE — Assessment & Plan Note (Signed)
 She has nonobstructive CAD on CT angiogram.  She does not have any symptoms of angina or myocardial failure.  In addition to guideline based medical treatment she would benefit from GLP-1 due to degree of obesity and associated comorbidities.  She is high risk for cardiovascular complications.

## 2024-01-07 NOTE — Assessment & Plan Note (Signed)
 LDL is not at goal. Elevated LDL may be secondary to nutrition, genetics and spillover effect from excess adiposity. Recommended LDL goal is <70 to reduce the risk of fatty streaks and the progression to obstructive ASCVD in the future.   Her 10 year risk is: The 10-year ASCVD risk score (Arnett DK, et al., 2019) is: 8.8%  Lab Results  Component Value Date   CHOL 161 05/15/2023   HDL 56.20 05/15/2023   LDLCALC 80 05/15/2023   TRIG 124.0 05/15/2023   CHOLHDL 3 05/15/2023    Patient may benefit from increasing rosuvastatin  to 20 mg a day to lower her LDL cholesterol to less than 55 considering the presence of plaque buildup on CT angiogram.

## 2024-01-13 ENCOUNTER — Other Ambulatory Visit (INDEPENDENT_AMBULATORY_CARE_PROVIDER_SITE_OTHER): Payer: Self-pay

## 2024-01-13 ENCOUNTER — Telehealth (INDEPENDENT_AMBULATORY_CARE_PROVIDER_SITE_OTHER): Payer: Self-pay

## 2024-01-13 ENCOUNTER — Other Ambulatory Visit (INDEPENDENT_AMBULATORY_CARE_PROVIDER_SITE_OTHER): Payer: Self-pay | Admitting: Internal Medicine

## 2024-01-13 DIAGNOSIS — Z9884 Bariatric surgery status: Secondary | ICD-10-CM

## 2024-01-13 DIAGNOSIS — I10 Essential (primary) hypertension: Secondary | ICD-10-CM

## 2024-01-13 DIAGNOSIS — R7303 Prediabetes: Secondary | ICD-10-CM

## 2024-01-13 DIAGNOSIS — E7800 Pure hypercholesterolemia, unspecified: Secondary | ICD-10-CM

## 2024-01-13 DIAGNOSIS — I2510 Atherosclerotic heart disease of native coronary artery without angina pectoris: Secondary | ICD-10-CM

## 2024-01-13 DIAGNOSIS — Z6841 Body Mass Index (BMI) 40.0 and over, adult: Secondary | ICD-10-CM

## 2024-01-13 MED ORDER — MOUNJARO 2.5 MG/0.5ML ~~LOC~~ SOAJ: 2.5000 mg | SUBCUTANEOUS | Status: DC

## 2024-01-13 MED ORDER — TIRZEPATIDE 2.5 MG/0.5ML ~~LOC~~ SOAJ: 2.5000 mg | SUBCUTANEOUS | 0 refills | Status: DC

## 2024-01-13 NOTE — Progress Notes (Signed)
 error

## 2024-01-13 NOTE — Telephone Encounter (Signed)
 PA for [facility] started.

## 2024-01-14 ENCOUNTER — Telehealth (INDEPENDENT_AMBULATORY_CARE_PROVIDER_SITE_OTHER): Payer: Self-pay | Admitting: Internal Medicine

## 2024-01-14 ENCOUNTER — Encounter (INDEPENDENT_AMBULATORY_CARE_PROVIDER_SITE_OTHER): Payer: Self-pay

## 2024-01-14 NOTE — Telephone Encounter (Signed)
 Ritika from [employer] called asking to speak to someone about a PA that the pt has initiated for [employer] . She can be reached at [phone], then select option 2. She reports they sent a fax to the wrong number earlier today.

## 2024-01-14 NOTE — Telephone Encounter (Signed)
 [employer]  denied, notified pt.

## 2024-01-14 NOTE — Telephone Encounter (Signed)
 Called, faxed notes and A1c levels to [facility]

## 2024-01-15 ENCOUNTER — Encounter: Payer: Self-pay | Admitting: Cardiology

## 2024-01-15 ENCOUNTER — Telehealth (INDEPENDENT_AMBULATORY_CARE_PROVIDER_SITE_OTHER): Payer: Self-pay

## 2024-01-15 NOTE — Telephone Encounter (Signed)
 Patient called nurse line stating she had swelling in bilateral ankles, to her they appear to be black and blue with a pulling sensation. Patient requesting advice at this time.

## 2024-01-15 NOTE — Telephone Encounter (Signed)
 I suspect that the discoloration may be related to her varicosities.  The patient does have lymphedema and so swelling unfortunately is quite common.  With ongoing swelling it is recommended that she utilize medical grade compression and she should be utilizing these daily.  If she is not wearing compression it certainly will cause a worsening of her swelling.  In addition she should be elevating her lower extremities.  If she has been using compression and has not been improving she can come in and have an Radio broadcast assistant applied.

## 2024-01-16 ENCOUNTER — Ambulatory Visit (INDEPENDENT_AMBULATORY_CARE_PROVIDER_SITE_OTHER): Admitting: Vascular Surgery

## 2024-01-16 NOTE — Progress Notes (Signed)
 Patient came in today for bilateral unna wraps but she did not feel that she needed the unna wraps at this time. Patient is wearing compression stockings and using lymphedema pump daily. Patient will follow up in the office prn.

## 2024-01-23 ENCOUNTER — Encounter: Payer: Self-pay | Admitting: Cardiology

## 2024-01-29 ENCOUNTER — Ambulatory Visit: Payer: Self-pay | Admitting: Oncology

## 2024-01-29 ENCOUNTER — Inpatient Hospital Stay: Attending: Oncology

## 2024-01-29 DIAGNOSIS — D509 Iron deficiency anemia, unspecified: Secondary | ICD-10-CM | POA: Diagnosis not present

## 2024-01-29 DIAGNOSIS — Z8639 Personal history of other endocrine, nutritional and metabolic disease: Secondary | ICD-10-CM

## 2024-01-29 LAB — IRON AND TIBC
Iron: 59 ug/dL (ref 28–170)
Saturation Ratios: 14 % (ref 10.4–31.8)
TIBC: 431 ug/dL (ref 250–450)
UIBC: 372 ug/dL

## 2024-01-29 LAB — CBC (CANCER CENTER ONLY)
HCT: 40.9 % (ref 36.0–46.0)
Hemoglobin: 13.6 g/dL (ref 12.0–15.0)
MCH: 30.6 pg (ref 26.0–34.0)
MCHC: 33.3 g/dL (ref 30.0–36.0)
MCV: 92.1 fL (ref 80.0–100.0)
Platelet Count: 263 K/uL (ref 150–400)
RBC: 4.44 MIL/uL (ref 3.87–5.11)
RDW: 12.3 % (ref 11.5–15.5)
WBC Count: 6.5 K/uL (ref 4.0–10.5)
nRBC: 0 % (ref 0.0–0.2)

## 2024-01-29 LAB — FERRITIN: Ferritin: 30 ng/mL (ref 11–307)

## 2024-01-30 NOTE — Telephone Encounter (Signed)
-----   Message from Annah JAYSON Skene sent at 01/29/2024  3:47 PM EDT ----- She is not anemic but she is iron deficient.  Does she want IV iron ----- Message ----- From: Interface, [facility] [place] Sent: 01/29/2024  11:14 AM EDT To: Annah JAYSON Skene, MD

## 2024-01-30 NOTE — Telephone Encounter (Signed)
 Per Dr. Melanee She is not anemic but she is iron deficient. Does she want IV iron.  Outbound call; informed of above.  Patient is in favor of IV iron. Forwarding to scheduling team to coordinate.

## 2024-01-31 ENCOUNTER — Other Ambulatory Visit

## 2024-01-31 ENCOUNTER — Inpatient Hospital Stay

## 2024-02-03 ENCOUNTER — Encounter: Payer: Self-pay | Admitting: Oncology

## 2024-02-03 NOTE — Telephone Encounter (Signed)
Infusions scheduled.

## 2024-02-04 ENCOUNTER — Ambulatory Visit (INDEPENDENT_AMBULATORY_CARE_PROVIDER_SITE_OTHER): Admitting: Internal Medicine

## 2024-02-05 ENCOUNTER — Other Ambulatory Visit: Payer: Self-pay | Admitting: Oncology

## 2024-02-06 DIAGNOSIS — H15101 Unspecified episcleritis, right eye: Secondary | ICD-10-CM | POA: Diagnosis not present

## 2024-02-06 DIAGNOSIS — T1501XA Foreign body in cornea, right eye, initial encounter: Secondary | ICD-10-CM | POA: Diagnosis not present

## 2024-02-06 DIAGNOSIS — H5711 Ocular pain, right eye: Secondary | ICD-10-CM | POA: Diagnosis not present

## 2024-02-07 ENCOUNTER — Inpatient Hospital Stay

## 2024-02-07 VITALS — BP 130/74 | HR 63 | Temp 97.6°F | Resp 17

## 2024-02-07 DIAGNOSIS — D509 Iron deficiency anemia, unspecified: Secondary | ICD-10-CM | POA: Diagnosis not present

## 2024-02-07 DIAGNOSIS — E611 Iron deficiency: Secondary | ICD-10-CM

## 2024-02-07 MED ORDER — IRON SUCROSE 20 MG/ML IV SOLN
200.0000 mg | INTRAVENOUS | Status: DC
  Administered 2024-02-07: 200 mg via INTRAVENOUS
  Filled 2024-02-07: qty 10

## 2024-02-07 MED ORDER — SODIUM CHLORIDE 0.9% FLUSH
10.0000 mL | Freq: Once | INTRAVENOUS | Status: AC | PRN
Start: 2024-02-07 — End: 2024-02-07
  Administered 2024-02-07: 10 mL
  Filled 2024-02-07: qty 10

## 2024-02-14 ENCOUNTER — Inpatient Hospital Stay: Attending: Oncology

## 2024-02-14 ENCOUNTER — Inpatient Hospital Stay

## 2024-02-14 VITALS — BP 121/82 | HR 66 | Temp 97.6°F | Resp 17

## 2024-02-14 DIAGNOSIS — E611 Iron deficiency: Secondary | ICD-10-CM

## 2024-02-14 DIAGNOSIS — D509 Iron deficiency anemia, unspecified: Secondary | ICD-10-CM | POA: Diagnosis not present

## 2024-02-14 MED ORDER — SODIUM CHLORIDE 0.9% FLUSH
10.0000 mL | Freq: Once | INTRAVENOUS | Status: AC | PRN
  Administered 2024-02-14: 10 mL
  Filled 2024-02-14: qty 10

## 2024-02-14 MED ORDER — IRON SUCROSE 20 MG/ML IV SOLN
200.0000 mg | INTRAVENOUS | Status: DC
  Administered 2024-02-14: 200 mg via INTRAVENOUS
  Filled 2024-02-14: qty 10

## 2024-02-14 NOTE — Progress Notes (Signed)
 Patient tolerated Venofer  infusion well. Explained recommendation of 30 min post monitoring. Patient refused to wait post monitoring. Educated on what signs to watch for & to call with any concerns. No questions, discharged. Stable

## 2024-02-14 NOTE — Patient Instructions (Signed)

## 2024-02-17 NOTE — Progress Notes (Unsigned)
 Cardiology Office Note    Date:  02/18/2024   ID:  Kimberly Patton, DOB 02-19-1956, MRN [id number]  PCP:  Avelina Greig BRAVO, MD  Cardiologist:  Redell Cave, MD  Electrophysiologist:  OLE ONEIDA HOLTS, MD   Chief Complaint: Follow up  History of Present Illness:   Kimberly Patton is a 68 y.o. female with history of nonobstructive CAD by coronary CTA in 09/2021, PAF no longer on [facility] after self discontinuing, SVT, diastolic dysfunction, HTN, iron  deficiency anemia, hemophilia carrier, hiatal hernia, meningioma, asthma, GERD, and obesity who presents for evaluation of blood pressure and heart rate.  Remote echo in 2013 showed an EF of 55 to 65%, normal wall motion, grade 1 diastolic dysfunction, mild mitral regurgitation, and normal RV systolic function.  Nuclear stress test at that time showed no evidence of ischemia.  Echo from 01/2020 showed an EF of 60 to 65%, no regional wall motion abnormalities, grade 1 diastolic dysfunction, normal RV systolic function and ventricular cavity size, and mildly elevated PASP estimated at 40.5 mmHg.  Nuclear stress test at that time showed no evidence of ischemia with a normal LVSF and was overall low risk.  No significant aortic atherosclerosis or coronary artery calcification was identified.  Outpatient cardiac monitoring in 03/2020 showed a predominant rhythm of sinus with an average rate of 71 bpm (range 43 to 193 bpm), 1 run of NSVT lasting 4 beats, occasional episodes of SVT were identified and associated with patient triggered events.  PFTs in 01/2021 consistent with moderate asthma.  Coronary CTA at that time showed a calcium  score of 59.8 which was the 76 percentile with nonobstructive CAD involving the proximal LAD (<25%), RCA (<25%), and LCx (minimal stenosis).  Echo at that time showed an EF of 55 to 60%, no regional wall motion abnormalities, normal LV diastolic function parameters, normal RV systolic function and ventricular cavity size, and trivial  mitral regurgitation.   Following temporal artery biopsy and prednisone  use in 03/2022, she was noted to have a brief episode of A-fib with spontaneous conversion to sinus rhythm.  Subsequent outpatient cardiac monitoring showed a predominant rhythm of sinus with an average rate of 74 bpm, 13 episodes of SVT with the longest interval lasting 14 beats, and no evidence of A-fib.  Given this was a lone episode, anticoagulation was deferred.  She was admitted to the hospital in late 10/2022 with chest pain, dyspnea, and tachypalpitations and was found to be in A-fib with RVR.  Echo in 11/2022 showed an EF of 55 to 60%, no regional wall motion abnormalities, mild concentric LVH, normal RV systolic function, ventricular cavity size, and RVSP, trivial mitral regurgitation, and estimated right atrial pressure of 3 mmHg.  She was placed on anticoagulation and spontaneously converted to sinus rhythm.  Follow-up Zio patch in 12/2022 showed a predominant rhythm of sinus with 1 run of NSVT lasting 6 beats, 24 episodes of SVT lasting up to 12.6 seconds and no evidence of A-fib/flutter.  Patient triggered events associated with SVT.  With regards to her A-fib, she is followed by EP with recommendation to continue apixaban  for stroke prophylaxis.  She subsequently notified our office that she self elected to discontinue oral anticoagulation in 10/2023.   She comes in doing well from a cardiac perspective and is without symptoms of angina or cardiac decompensation.  She reports she was at the cancer center last week with a blood pressure in the 1 teens over 40s and was asked if she was  dizzy.  She was completely asymptomatic with this.  In the setting she became worried that her blood pressure was running too low.  She has been without symptoms of of chest pain, dyspnea, palpitations, dizziness, presyncope, or syncope.  Blood pressure today in the office is 127/82 with a heart rate of 82 bpm.  She remains on verapamil  120 mg twice  daily and spironolactone  25 mg daily.  Has not needed any as needed furosemide .  She also notes her smart watch has notified her of heart rates in the 50s bpm while at rest watching TV in the evenings and heart rates dipping into the 40s bpm overnight, while sleeping.  She reports she typically sleeps 5 to 6 hours per night, having to get up multiple times per night to void.  She reports I do not snore.  She indicates that she does not have sleep apnea.  No prior sleep study.  Continues to take aspirin  in place of OAC.  Does not want to reconsider anticoagulation for underlying A-fib.  Reports her episodes of A-fib have only occurred when taking steroids.  Continues to struggle with weight loss, insurance will not approve GLP-1 therapy.  Drinking multiple protein shakes per day.  She also reports intermittent right sided abdominal discomfort that is worse with leaning back or rotational movement and reproducible to her palpation.  She wonders if this is related to her prior surgical mesh.    Labs independently reviewed: 01/2024 - Hgb 13.6, PLT 263 12/2023 - A1c 5.5, TSH normal, potassium 4.4, BUN 24, serum creatinine 0.6, BUN 4.5, AST/ALT normal 05/2023 - TC 161, TG 124, HDL 56, LDL 80  Past Medical History:  Diagnosis Date   ALLERGIC RHINITIS 04/03/2007   Allergy    Not sure   Anemia    ((Pt Qnr Sub: Denies at visit from 08/28/2021 with endocrinology))    Anginal pain (HCC)    Anxiety    Arthritis Not sure   Asthma    hx of years ago - no longer a problem    ASTHMA, PERSISTENT, MODERATE 04/03/2007   Back pain    Chest pain    Depression screen 01/08/2023   Dyspnea    with exertion   Dyspnea on exertion    a. 11/2007 Echo: EF 60%.   Dysrhythmia    hx of heart arrhythmia 10-15 years ago - followed by DR Waddell - not seen in years    Empty sella syndrome (HCC)    GERD (gastroesophageal reflux disease)    Headache    Hemophilia carrier    Hiatal hernia    History of kidney stones     Hypercholesteremia 06/09/2019   Hypertension    Joint pain    HIPS / LEGS   Joint pain    Knee injury    RT   Lymph edema    bilateral legs   Meningioma (HCC)    Morbid obesity (HCC)    Neuromuscular disorder (HCC) Not sure, since my fall   Occipital neuralgia    Osteoarthritis    Palpitations    Paroxysmal SVT (supraventricular tachycardia) (HCC)    a. 11/2011 48h Holter: RSR, rare PVC's, occas PAC's   Retinal tear of right eye 01/2016   Skin cancer    SOB (shortness of breath)    Thyroid  nodule 04/15/2018   Ventral hernia     Past Surgical History:  Procedure Laterality Date   ABDOMINAL HYSTERECTOMY  Think 1994   APPENDECTOMY  1996  ARTERY BIOPSY Left 03/21/2022   Procedure: BIOPSY TEMPORAL ARTERY;  Surgeon: Jama Cordella MATSU, MD;  Location: [facility];  Service: Vascular;  Laterality: Left;   BIOPSY THYROID      08/2021   BREAST CYST EXCISION Right 1994   Benign   CARDIOVERSION N/A 11/12/2022   Procedure: CARDIOVERSION;  Surgeon: Perla Evalene PARAS, MD;  Location: [facility];  Service: Cardiovascular;  Laterality: N/A;   CESAREAN SECTION     x2   CHOLECYSTECTOMY  1995   COLONOSCOPY WITH PROPOFOL  N/A 05/09/2021   Procedure: COLONOSCOPY WITH PROPOFOL ;  Surgeon: Therisa Bi, MD;  Location: [facility] END[facility]COPY;  Service: Gastroenterology;  Laterality: N/A;   ESOPHAGOGASTRODUODENOSCOPY (EGD) WITH PROPOFOL  N/A 11/02/2022   Procedure: ESOPHAGOGASTRODUODENOSCOPY (EGD) WITH PROPOFOL ;  Surgeon: Maryruth Ole DASEN, MD;  Location: [facility] ENDOSCOPY;  Service: Endoscopy;  Laterality: N/A;   EYE SURGERY  01/2016   Repair retinal tear   FOOT SURGERY  1995 / 1996   x2   HERNIA REPAIR     KNEE ARTHROSCOPY W/ MENISCAL REPAIR  07/25/2013   right knee Dr. Josefina   LAPAROSCOPIC GASTRIC SLEEVE RESECTION N/A 06/08/2013   Procedure: LAPAROSCOPIC GASTRIC SLEEVE RESECTION AND EXCISION OF SEBACEUS CYST FROM MID CHEST takedown of incarcerated ventral hernia and primary repair endoscopy;  Surgeon: Donnice KATHEE Lunger, MD;  Location: [facility];  Service: General;  Laterality: N/A;   LAPAROSCOPIC NISSEN FUNDOPLICATION N/A 02/02/2020   Procedure: LAPAROSCOPIC REPAIR LARGE SYMPTOMATIC HIATAL HERNIA WITH UPPER ENDOSCOPY;  Surgeon: Lunger Donnice, MD;  Location: [facility];  Service: General;  Laterality: N/A;   moles  06/2013   removed 2 moles from under arm and  lowback   NECK SURGERY     occipital nerve damage- injections    OVARIAN CYST REMOVAL  1970   PILONIDAL CYST EXCISION  1975   RADIOACTIVE SEED GUIDED EXCISIONAL BREAST BIOPSY Right 06/23/2021   Procedure: RADIOACTIVE SEED GUIDED EXCISIONAL RIGHT BREAST BIOPSY;  Surgeon: Lunger Donnice, MD;  Location: [facility];  Service: General;  Laterality: Right;   RETINAL TEAR REPAIR CRYOTHERAPY Right 01/2016   [place]   SKIN CANCER EXCISION     TEE WITHOUT CARDIOVERSION N/A 11/12/2022   Procedure: TRANSESOPHAGEAL ECHOCARDIOGRAM;  Surgeon: Perla Evalene PARAS, MD;  Location: [facility];  Service: Cardiovascular;  Laterality: N/A;   THYROID  LOBECTOMY Left 08/21/2022   Procedure: LEFT THYROID  LOBECTOMY;  Surgeon: Eletha Boas, MD;  Location: [facility];  Service: General;  Laterality: Left;   THYROIDECTOMY, PARTIAL     TONSILLECTOMY     TUBAL LIGATION  Think 1994   UPPER GI ENDOSCOPY  06/08/2013   Procedure: UPPER GI ENDOSCOPY;  Surgeon: Donnice KATHEE Lunger, MD;  Location: [facility];  Service: General;;   US  ECHOCARDIOGRAPHY  12/2011   WNL - EF 55-60%, mild MR, grade 1 diastolic dysnfiction (mild)   VENTRAL HERNIA REPAIR  2000    Current Medications: Current Meds  Medication Sig   aspirin  EC 81 MG tablet Take 81 mg by mouth daily. Swallow whole.   Calcium  Citrate-Vitamin D  (CITRACAL + D PO) Take 2 tablets by mouth daily.    Cholecalciferol (VITAMIN D3) 1.25 MG (50000 UT) CAPS TAKE 1 CAPSULE BY MOUTH ONE TIME PER WEEK   Coenzyme Q10-Vitamin E  (QUNOL ULTRA COQ10) 100-150 MG-UNIT CAPS Take 100 mg by mouth daily.   Cyanocobalamin  (VITAMIN B-12) 2500 MCG SUBL  Place 2,500 mcg under the tongue daily.   diltiazem  (CARDIZEM ) 30 MG tablet Take 30 mg by mouth 2 (two) times  daily as needed.   esomeprazole (NEXIUM) 40 MG capsule Take 40 mg by mouth daily.   furosemide  (LASIX ) 20 MG tablet Take 1 tablet (20 mg total) by mouth daily as needed.   Lactobacillus Rhamnosus, GG, (CULTURELLE IMMUNITY SUPPORT PO) Take 1 capsule by mouth daily.   levalbuterol  (XOPENEX  HFA) 45 MCG/ACT inhaler Inhale 2 puffs into the lungs every 6 (six) hours as needed for wheezing.   Menaquinone-7 (VITAMIN K2  PO) Take 100 mg by mouth daily.   metFORMIN  (GLUCOPHAGE -XR) 500 MG 24 hr tablet Take 1 tablet (500 mg total) by mouth 2 (two) times daily with a meal.   methocarbamol  (ROBAXIN ) 500 MG tablet Take 500 mg by mouth at bedtime as needed for muscle spasms.   Multiple Vitamins-Minerals (CENTRUM SILVER 50+WOMEN PO) Take 1 tablet by mouth daily.   nitroGLYCERIN  (NITROSTAT ) 0.4 MG SL tablet Place 1 tablet (0.4 mg total) under the tongue every 5 (five) minutes as needed for chest pain.   Riboflavin  400 MG CAPS Take 400 mg by mouth daily.   rosuvastatin  (CRESTOR ) 10 MG tablet Take 1 tablet (10 mg total) by mouth daily.   spironolactone  (ALDACTONE ) 25 MG tablet Take 1 tablet (25 mg total) by mouth daily.   traMADol  (ULTRAM ) 50 MG tablet Take 1 tablet (50 mg total) by mouth every 6 (six) hours as needed.   TURMERIC PO Take 2 tablets by mouth daily. 500 mg  With Ginger 50 mg Gummies   verapamil  (CALAN -SR) 120 MG CR tablet TAKE 1 TABLET BY MOUTH 2 TIMES DAILY.    Allergies:   Wasp venom, Chlorhexidine  gluconate, Metronidazole, Prednisone , Other, and Sodium hypochlorite   Social History   Socioeconomic History   Marital status: Divorced    Spouse name: Not on file   Number of children: 2   Years of education: 17   Highest education level: Bachelor's degree (e.g., BA, AB, BS)  Occupational History   Occupation: Magazine features editor: [employer]   Occupation: Retired Runner, broadcasting/film/video   Tobacco Use   Smoking status: Never   Smokeless tobacco: Never   Tobacco comments:    smoke at age [age]  Vaping Use   Vaping status: Never Used  Substance and Sexual Activity   Alcohol use: Not Currently   Drug use: No   Sexual activity: Not Currently    Partners: Male    Birth control/protection: Abstinence, Pill  Other Topics Concern   Not on file  Social History Narrative   Lives at home alone.   Right-handed.   Occasional caffeine use.   Social Drivers of Corporate investment banker Strain: Low Risk  (12/31/2023)   Received [facility]   Overall Financial Resource Strain (CARDIA)    How hard is it for you to pay for the very basics like food, housing, medical care, and heating?: Not hard at all  Food Insecurity: No Food Insecurity (12/31/2023)   Received from [facility]   Hunger Vital Sign    Within the past 12 months, you worried that your food would run out before you got the money to buy more.: Never true    Within the past 12 months, the food you bought just didn't last and you didn't have money to get more.: Never true  Transportation Needs: No Transportation Needs (12/31/2023)   Received from [facility] - Transportation    In the past 12 months, has lack of transportation kept you from medical appointments or from getting  medications?: No    In the past 12 months, has lack of transportation kept you from meetings, work, or from getting things needed for daily living?: No  Physical Activity: Insufficiently Active (06/26/2023)   Exercise Vital Sign    Days of Exercise per Week: 1 day    Minutes of Exercise per Session: 10 min  Stress: No Stress Concern Present (06/26/2023)   [employer] of Occupational Health - Occupational Stress Questionnaire    Feeling of Stress : Not at all  Social Connections: Moderately Integrated (12/14/2023)   Social Connection and Isolation Panel    Frequency of Communication with Friends and Family: Three times a  week    Frequency of Social Gatherings with Friends and Family: Three times a week    Attends Religious Services: More than 4 times per year    Active Member of Clubs or Organizations: Yes    Attends Engineer, structural: More than 4 times per year    Marital Status: Divorced     Family History:  The patient's family history includes Breast cancer in her mother; Diabetes in her father, mother, and another family member; Heart disease in her father and mother; Hemophilia in an other family member; Hypertension in her father, mother, and another family member; Kidney failure in her mother; Obesity in her mother; Stroke in an other family member.  ROS:   12-point review of systems is negative unless otherwise noted in the HPI.   EKGs/Labs/Other Studies Reviewed:    Studies reviewed were summarized above. The additional studies were reviewed today:  Zio patch 12/2022: Patient had a min HR of 42 bpm, max HR of 187 bpm, and avg HR of 70 bpm. Predominant underlying rhythm was Sinus Rhythm. 1 run of Ventricular Tachycardia occurred lasting 6 beats with a max rate of 146 bpm (avg 117 bpm). 24 Supraventricular Tachycardia  runs occurred, the run with the fastest interval lasting 5 beats with a max rate of 187 bpm, the longest lasting 12.6 secs with an avg rate of 144 bpm. Isolated SVEs were rare (<1.0%), SVE Couplets were rare (<1.0%), and SVE Triplets were rare (<1.0%).  Isolated VEs were rare (<1.0%), VE Couplets were rare (<1.0%), and no VE Triplets were present.    Conclusion Paroxysmal SVT noted. No evidence for A-fib or a flutter. Patient triggered events associated with SVT. __________  2D echo 11/10/2022: 1. Left ventricular ejection fraction, by estimation, is 55 to 60%. Left  ventricular ejection fraction by PLAX is 58 %. The left ventricle has  normal function. The left ventricle has no regional wall motion  abnormalities. There is mild concentric left  ventricular  hypertrophy. Left ventricular diastolic parameters are  indeterminate.   2. Right ventricular systolic function is normal. The right ventricular  size is normal. There is normal pulmonary artery systolic pressure.   3. The mitral valve is normal in structure. Trivial mitral valve  regurgitation. No evidence of mitral stenosis.   4. The aortic valve is tricuspid. Aortic valve regurgitation is not  visualized. No aortic stenosis is present.   5. The inferior vena cava is normal in size with greater than 50%  respiratory variability, suggesting right atrial pressure of 3 mmHg.  __________  Zio patch 03/2022: Patient had a min HR of 44 bpm, max HR of 197 bpm, and avg HR of 74 bpm. Predominant underlying rhythm was Sinus Rhythm. 13 Supraventricular Tachycardia runs occurred, the run with the fastest interval lasting 5 beats with a max  rate of 197 bpm, the  longest lasting 14 beats with an avg rate of 155 bpm. Some episodes of Supraventricular Tachycardia may be possible Atrial Tachycardia with variable block. Isolated SVEs were rare (<1.0%), SVE Couplets were rare (<1.0%), and SVE Triplets were rare  (<1.0%). Isolated VEs were rare (<1.0%, 292), VE Couplets were rare (<1.0%, 8), and VE Triplets were rare (<1.0%, 1).   Conclusion Paroxysmal SVT, no evidence of A-fib noted Continue meds as prescribed __________   2D echo 09/28/2021: 1. Left ventricular ejection fraction, by estimation, is 55 to 60%. The  left ventricle has normal function. The left ventricle has no regional  wall motion abnormalities. Left ventricular diastolic parameters were  normal. The average left ventricular  global longitudinal strain is -20.5 %. The global longitudinal strain is  normal.   2. Right ventricular systolic function is normal. The right ventricular  size is normal.   3. The mitral valve is normal in structure. Trivial mitral valve  regurgitation.   4. The aortic valve is tricuspid. Aortic valve  regurgitation is not  visualized.   5. The inferior vena cava is normal in size with greater than 50%  respiratory variability, suggesting right atrial pressure of 3 mmHg. __________   Coronary CTA 09/21/2021: FINDINGS: Aorta:  Normal size.  No calcifications.  No dissection.   Aortic Valve:  Trileaflet.  No calcifications.   Coronary Arteries:  Normal coronary origin.  Right dominance.   RCA is a dominant artery that gives rise to PDA and PLA. There is calcified plaque proximally causing minimal stenosis (<25%).   Left main is a large artery that gives rise to LAD and LCX arteries. LM has no disease.   LAD has calcified plaque proximally causing minimal stenosis (<25%).   LCX is a non-dominant artery that gives rise to two obtuse marginal branches. There is calcified plaque in the proximal LCx causing minimal stenosis.   Other findings:   Normal pulmonary vein drainage into the left atrium.   Normal left atrial appendage without a thrombus.   Normal size of the pulmonary artery.   IMPRESSION: 1. Coronary calcium  score of 59.8. This was 76th percentile for age and sex matched control.   2. Normal coronary origin with right dominance.   3. Calcified plaque causing minimal stenosis in the proximal LAD, RCA, LCx arteries.   4. CAD-RADS 1. Minimal non-obstructive CAD (0-24%). Consider non-atherosclerotic causes of chest pain. Consider preventive therapy and risk factor modification. __________   Zio patch 03/2020: Patient had a min HR of 43 bpm, max HR of 193 bpm, and avg HR of 71 bpm. Predominant underlying rhythm was Sinus Rhythm. 1 Ventricular Tachycardia run occurred, lasting 4 beats. Occasional Supraventricular Tachycardia runs occurred,  associated with patient triggered events. Supraventricular Tachycardia was detected  within +/- 45 seconds of symptomatic patient event(s). __________   Lexiscan  MPI 01/2020: There was no ST segment deviation noted during  stress. No T wave inversion was noted during stress. The study is normal. This is a low risk study. The left ventricular ejection fraction is normal (55-65%). No significant aortic or coronary artery calcifications __________   2D echo 01/21/2020: 1. Left ventricular ejection fraction, by estimation, is 60 to 65%. The  left ventricle has normal function. The left ventricle has no regional  wall motion abnormalities. Left ventricular diastolic parameters are  consistent with Grade I diastolic  dysfunction (impaired relaxation). The average left ventricular global  longitudinal strain is -20.4 %.   2. Right  ventricular systolic function is normal. The right ventricular  size is normal. There is mildly elevated pulmonary artery systolic  pressure. The estimated right ventricular systolic pressure is 40.5 mmHg.   Comparison(s): EF 55-65%. __________   See Epic for remaining remote cardiac studies   EKG:  EKG is not ordered today.    Recent Labs: 12/17/2023: ALT 15; BUN 24; Creatinine, Ser 0.60; Potassium 4.4; Sodium 139; TSH 1.53 01/29/2024: Hemoglobin 13.6; Platelet Count 263  Recent Lipid Panel    Component Value Date/Time   CHOL 161 05/15/2023 0721   CHOL 168 07/31/2021 0905   TRIG 124.0 05/15/2023 0721   HDL 56.20 05/15/2023 0721   HDL 67 07/31/2021 0905   CHOLHDL 3 05/15/2023 0721   VLDL 24.8 05/15/2023 0721   LDLCALC 80 05/15/2023 0721   LDLCALC 85 07/31/2021 0905    PHYSICAL EXAM:    VS:  BP 127/82 (BP Location: Left Wrist, Patient Position: Sitting, Cuff Size: Large)   Pulse 82   Ht 5' 6 (1.676 m)   Wt 270 lb 3.2 oz (122.6 kg)   SpO2 96%   BMI 43.61 kg/m   BMI: Body mass index is 43.61 kg/m.  Physical Exam Vitals reviewed.  Constitutional:      Appearance: She is well-developed.  HENT:     Head: Normocephalic and atraumatic.  Eyes:     General:        Right eye: No discharge.        Left eye: No discharge.  Cardiovascular:     Rate and Rhythm: Normal  rate and regular rhythm.     Heart sounds: Normal heart sounds, S1 normal and S2 normal. Heart sounds not distant. No midsystolic click and no opening snap. No murmur heard.    No friction rub.  Pulmonary:     Effort: Pulmonary effort is normal. No respiratory distress.     Breath sounds: Normal breath sounds. No decreased breath sounds, wheezing, rhonchi or rales.  Abdominal:     Palpations: Abdomen is soft.     Tenderness: There is abdominal tenderness.     Comments: Pinpoint right upper quadrant abdominal pain to palpation, reproduces abdominal discomfort she has been experiencing.  Musculoskeletal:     Cervical back: Normal range of motion.  Skin:    General: Skin is warm and dry.     Nails: There is no clubbing.  Neurological:     Mental Status: She is alert and oriented to person, place, and time.  Psychiatric:        Speech: Speech normal.        Behavior: Behavior normal.        Thought Content: Thought content normal.        Judgment: Judgment normal.     Wt Readings from Last 3 Encounters:  02/18/24 270 lb 3.2 oz (122.6 kg)  01/07/24 266 lb (120.7 kg)  12/17/23 267 lb (121.1 kg)     ASSESSMENT & PLAN:   PAF: Maintaining sinus rhythm with verapamil  120 mg twice daily.  CHA2DS2-VASc at least 4 (HTN, age x 1, vascular disease, sex category).  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  Currently taking aspirin .  Unclear at this time if she would be a candidate for Watchman.  Revisit in follow-up.  She is aware of stroke risk.  Bradycardia: She reports heart rates in the 50s bpm while at rest watching TV in the evenings with heart rates dipping into the 40s bpm in the  overnight hours, presumably when sleeping.  She denies history of sleep apnea or snoring.  Declines a sleep study evaluation.  Place Zio patch for further evaluation.  For now, remains on verapamil  120 mg twice daily.  Hemodynamically stable and has demonstrated appropriate  chronotropic competence.  Recent TSH and potassium normal.  PSVT: Quiescent.  Remains on verapamil  as above.  Coronary artery calcification/HLD: She is without symptoms of angina or cardiac decompensation.  LDL 80 in 05/2023.  Rosuvastatin  10 mg.  HTN: Blood pressure is well-controlled in the office today and has been well-controlled upon chart biopsy.  She is asymptomatic.  Continue verapamil  120 mg twice daily and spironolactone  milligram daily.  Recent labs stable.  Obesity with lymphedema: Weight loss is encouraged through early diet and regular exercise.  Continue lymphedema pumps.  She has not needed any as needed furosemide .  Abdominal discomfort: No red flag symptoms.  Does not appear to be cardiac in etiology.  Follow-up with PCP.     Disposition: F/u with Dr. Darliss or an APP in 2 months.   Medication Adjustments/Labs and Tests Ordered: Current medicines are reviewed at length with the patient today.  Concerns regarding medicines are outlined above. Medication changes, Labs and Tests ordered today are summarized above and listed in the Patient Instructions accessible in Encounters.   Signed, Bernardino Bring, PA-C 02/18/2024 3:47 PM     [facility] [place] [address] [place], [place] [address] [phone]

## 2024-02-18 ENCOUNTER — Ambulatory Visit: Attending: Physician Assistant | Admitting: Physician Assistant

## 2024-02-18 ENCOUNTER — Encounter: Payer: Self-pay | Admitting: Physician Assistant

## 2024-02-18 ENCOUNTER — Ambulatory Visit

## 2024-02-18 VITALS — BP 127/82 | HR 82 | Ht 66.0 in | Wt 270.2 lb

## 2024-02-18 DIAGNOSIS — I480 Paroxysmal atrial fibrillation: Secondary | ICD-10-CM | POA: Diagnosis not present

## 2024-02-18 DIAGNOSIS — I10 Essential (primary) hypertension: Secondary | ICD-10-CM

## 2024-02-18 DIAGNOSIS — R001 Bradycardia, unspecified: Secondary | ICD-10-CM

## 2024-02-18 DIAGNOSIS — R109 Unspecified abdominal pain: Secondary | ICD-10-CM | POA: Diagnosis not present

## 2024-02-18 DIAGNOSIS — E782 Mixed hyperlipidemia: Secondary | ICD-10-CM | POA: Diagnosis not present

## 2024-02-18 DIAGNOSIS — I2510 Atherosclerotic heart disease of native coronary artery without angina pectoris: Secondary | ICD-10-CM

## 2024-02-18 DIAGNOSIS — R600 Localized edema: Secondary | ICD-10-CM | POA: Diagnosis not present

## 2024-02-18 DIAGNOSIS — I471 Supraventricular tachycardia, unspecified: Secondary | ICD-10-CM | POA: Diagnosis not present

## 2024-02-18 NOTE — Patient Instructions (Addendum)
 Medication Instructions:  Your physician recommends that you continue on your current medications as directed. Please refer to the Current Medication list given to you today.   *If you need a refill on your cardiac medications before your next appointment, please call your pharmacy*  Lab Work: None ordered at this time  If you have labs (blood work) drawn today and your tests are completely normal, you will receive your results only by: MyChart Message (if you have MyChart) OR A paper copy in the mail If you have any lab test that is abnormal or we need to change your treatment, we will call you to review the results.  Testing/Procedures: GEOFFRY HEWS- Long Term Monitor Instructions  Your physician has requested you wear a ZIO patch monitor for 14 days.  This is a single patch monitor. [employer] supplies one patch monitor per enrollment. Additional stickers are not available. Please do not apply patch if you will be having a Nuclear Stress Test,  Echocardiogram, Cardiac CT, MRI, or Chest Xray during the period you would be wearing the  monitor. The patch cannot be worn during these tests. You cannot remove and re-apply the  ZIO XT patch monitor.  Your ZIO patch monitor will be mailed 3 day USPS to your address on file. It may take 3-5 days  to receive your monitor after you have been enrolled.  Once you have received your monitor, please review the enclosed instructions. Your monitor  has already been registered assigning a specific monitor serial # to you.  Billing and Patient Assistance Program Information  We have supplied [employer] with any of your insurance information on file for billing purposes. [employer] offers a sliding scale Patient Assistance Program for patients that do not have  insurance, or whose insurance does not completely cover the cost of the ZIO monitor.  You must apply for the Patient Assistance Program to qualify for this discounted rate.  To apply, please call [employer] at  [phone], select option 4, select option 2, ask to apply for  Patient Assistance Program. Meredeth will ask your household income, and how many people  are in your household. They will quote your out-of-pocket cost based on that information.  [employer] will also be able to set up a 42-month, interest-free payment plan if needed.  Applying the monitor   Shave hair from upper left chest.  Hold abrader disc by orange tab. Rub abrader in 40 strokes over the upper left chest as  indicated in your monitor instructions.  Clean area with 4 enclosed alcohol pads. Let dry.  Apply patch as indicated in monitor instructions. Patch will be placed under collarbone on left  side of chest with arrow pointing upward.  Rub patch adhesive wings for 2 minutes. Remove white label marked 1. Remove the white  label marked 2. Rub patch adhesive wings for 2 additional minutes.  While looking in a mirror, press and release button in center of patch. A small green light will  flash 3-4 times. This will be your only indicator that the monitor has been turned on.  Do not shower for the first 24 hours. You may shower after the first 24 hours.  Press the button if you feel a symptom. You will hear a small click. Record Date, Time and  Symptom in the Patient Logbook.  When you are ready to remove the patch, follow instructions on the last 2 pages of Patient  Logbook. Stick patch monitor onto the last page of Patient Logbook.  Place Patient Logbook in the blue and white box. Use locking tab on box and tape box closed  securely. The blue and white box has prepaid postage on it. Please place it in the mailbox as  soon as possible. Your physician should have your test results approximately 7 days after the  monitor has been mailed back to [facility].  Call [facility] Customer Care at [phone] if you have questions regarding  your ZIO XT patch monitor. Call them immediately if you see an orange light  blinking on your  monitor.  If your monitor falls off in less than 4 days, contact our Monitor department at [phone].  If your monitor becomes loose or falls off after 4 days call [employer] at [phone] for  suggestions on securing your monitor   Follow-Up: At [facility], you and your health needs are our priority.  As part of our continuing mission to provide you with exceptional heart care, our providers are all part of one team.  This team includes your primary Cardiologist (physician) and Advanced Practice Providers or APPs (Physician Assistants and Nurse Practitioners) who all work together to provide you with the care you need, when you need it.  Your next appointment:   2 month(s)  Provider:   You may see Redell Cave, MD or Cadence Franchester, PA-C

## 2024-02-20 ENCOUNTER — Inpatient Hospital Stay

## 2024-02-20 ENCOUNTER — Ambulatory Visit (INDEPENDENT_AMBULATORY_CARE_PROVIDER_SITE_OTHER): Admitting: Internal Medicine

## 2024-02-20 VITALS — BP 122/78 | HR 73 | Temp 96.8°F | Resp 18

## 2024-02-20 DIAGNOSIS — E611 Iron deficiency: Secondary | ICD-10-CM

## 2024-02-20 DIAGNOSIS — D509 Iron deficiency anemia, unspecified: Secondary | ICD-10-CM | POA: Diagnosis not present

## 2024-02-20 DIAGNOSIS — B301 Conjunctivitis due to adenovirus: Secondary | ICD-10-CM | POA: Diagnosis not present

## 2024-02-20 MED ORDER — IRON SUCROSE 20 MG/ML IV SOLN
200.0000 mg | INTRAVENOUS | Status: DC
  Administered 2024-02-20: 200 mg via INTRAVENOUS
  Filled 2024-02-20: qty 10

## 2024-02-20 NOTE — Patient Instructions (Signed)

## 2024-02-21 ENCOUNTER — Inpatient Hospital Stay

## 2024-02-25 ENCOUNTER — Inpatient Hospital Stay

## 2024-02-25 ENCOUNTER — Encounter: Payer: Self-pay | Admitting: Pharmacist

## 2024-02-25 VITALS — BP 112/61 | HR 62 | Temp 97.0°F | Resp 18

## 2024-02-25 DIAGNOSIS — D509 Iron deficiency anemia, unspecified: Secondary | ICD-10-CM | POA: Diagnosis not present

## 2024-02-25 DIAGNOSIS — E611 Iron deficiency: Secondary | ICD-10-CM

## 2024-02-25 MED ORDER — IRON SUCROSE 20 MG/ML IV SOLN
200.0000 mg | INTRAVENOUS | Status: DC
  Administered 2024-02-25: 200 mg via INTRAVENOUS
  Filled 2024-02-25: qty 10

## 2024-02-25 NOTE — Patient Instructions (Signed)

## 2024-02-25 NOTE — Progress Notes (Addendum)
 Pharmacy Quality Measure Review  This patient is appearing on a report for being at risk of failing the adherence measure for diabetes medications this calendar year.   Medication: metformin  XR 500 mg Last fill date: 08/14/23 for 90 day supply  Insurance report was not up to date. No action needed at this time.  Medication has been refilled as of 12/05/23 x90 day supply. No refills remaining.   Prescriptions filled by Dr. Lucas Parker. Next refill due prior to 03/09/24.    03/05/24: Contacted pharmacy to help facilitate refills. Pharmacy will contact prescriber 9/26 for refill.

## 2024-02-28 ENCOUNTER — Inpatient Hospital Stay

## 2024-03-03 ENCOUNTER — Other Ambulatory Visit: Payer: Self-pay | Admitting: Family Medicine

## 2024-03-03 NOTE — Telephone Encounter (Signed)
 Last office visit 12/17/23 for  Burning mouth syndrome.  Last refilled 12/10/23 for #12 with no refills.  Vit D level 03/14/2022 which was normal at 54.40 ng/m.  Next Appt: No future appointments with PCP.

## 2024-03-06 ENCOUNTER — Encounter: Payer: Self-pay | Admitting: Pulmonary Disease

## 2024-03-06 ENCOUNTER — Ambulatory Visit: Admitting: Pulmonary Disease

## 2024-03-06 ENCOUNTER — Inpatient Hospital Stay

## 2024-03-06 ENCOUNTER — Other Ambulatory Visit (INDEPENDENT_AMBULATORY_CARE_PROVIDER_SITE_OTHER): Payer: Self-pay | Admitting: Internal Medicine

## 2024-03-06 VITALS — BP 105/53 | HR 61 | Temp 97.8°F | Resp 18

## 2024-03-06 VITALS — BP 130/76 | HR 70 | Temp 98.0°F | Ht 66.0 in | Wt 268.4 lb

## 2024-03-06 DIAGNOSIS — R600 Localized edema: Secondary | ICD-10-CM

## 2024-03-06 DIAGNOSIS — I480 Paroxysmal atrial fibrillation: Secondary | ICD-10-CM | POA: Diagnosis not present

## 2024-03-06 DIAGNOSIS — Z6841 Body Mass Index (BMI) 40.0 and over, adult: Secondary | ICD-10-CM

## 2024-03-06 DIAGNOSIS — E611 Iron deficiency: Secondary | ICD-10-CM

## 2024-03-06 DIAGNOSIS — R7303 Prediabetes: Secondary | ICD-10-CM

## 2024-03-06 DIAGNOSIS — J4520 Mild intermittent asthma, uncomplicated: Secondary | ICD-10-CM | POA: Diagnosis not present

## 2024-03-06 DIAGNOSIS — D509 Iron deficiency anemia, unspecified: Secondary | ICD-10-CM | POA: Diagnosis not present

## 2024-03-06 MED ORDER — IRON SUCROSE 20 MG/ML IV SOLN
200.0000 mg | INTRAVENOUS | Status: DC
  Administered 2024-03-06: 200 mg via INTRAVENOUS
  Filled 2024-03-06: qty 10

## 2024-03-06 MED ORDER — SODIUM CHLORIDE 0.9% FLUSH
10.0000 mL | Freq: Once | INTRAVENOUS | Status: AC | PRN
  Administered 2024-03-06: 10 mL
  Filled 2024-03-06: qty 10

## 2024-03-06 MED ORDER — LEVALBUTEROL TARTRATE 45 MCG/ACT IN AERO: 2.0000 | INHALATION_SPRAY | Freq: Four times a day (QID) | RESPIRATORY_TRACT | 2 refills | Status: AC | PRN

## 2024-03-06 NOTE — Patient Instructions (Signed)
 VISIT SUMMARY:  During your follow-up visit, we discussed your asthma, nocturia, leg swelling, and general health maintenance. Your asthma is well-controlled, and you requested a new inhaler for emergency use. We also talked about your leg swelling, which may be due to lipedema, and explored potential treatments. Additionally, we reviewed your general health and lifestyle recommendations.  YOUR PLAN:  -MILD INTERMITTENT ASTHMA: Mild intermittent asthma is a condition where you experience occasional asthma symptoms. You use Xopenex  (levalbuterol ) as a rescue inhaler, which you have not used in a long time. We will prescribe a new Xopenex  inhaler for emergency use.  -LIPEDEMA: Lipedema is a condition characterized by an abnormal accumulation of fat in the legs. There have been no significant changes in your condition. We discussed potential treatments, including medications like Mounjaro , but your insurance does not cover it as you are prediabetic, not diabetic. Be cautious about the validity of social media claims regarding pills that break down fat.  -GENERAL HEALTH MAINTENANCE: We reviewed your routine labs and noted that your studies are normal. Continue following a healthy lifestyle, including a balanced diet and regular exercise.  INSTRUCTIONS:  Please follow up with your cardiologist regarding your blood pressure management. Continue your iron  infusions every six months as scheduled. If you experience any new or worsening symptoms, please contact our office.

## 2024-03-06 NOTE — Progress Notes (Signed)
 Patient declined to wait the 30 minutes for post iron infusion observation today. Tolerated infusion well. VSS.

## 2024-03-06 NOTE — Progress Notes (Signed)
 Subjective:    Patient ID: Kimberly Patton, female    DOB: 04/20/1956, 68 y.o.   MRN: [id number]  Patient Care Team: Avelina Greig BRAVO, MD as PCP - General (Family Medicine) Darliss Rogue, MD as PCP - Cardiology (Cardiology) Cindie Ole DASEN, MD as PCP - Electrophysiology (Cardiology) Heniford, Francis, MD as Referring Physician (Surgery) Tamea Dedra CROME, MD as Consulting Physician (Pulmonary Disease) Eletha Boas, MD as Consulting Physician (General Surgery) Jaye Fallow, MD as Referring Physician (Ophthalmology) Melanee Annah BROCKS, MD as Consulting Physician (Oncology)  Chief Complaint  Patient presents with   Asthma    No breathing problems.     BACKGROUND/INTERVAL: This is a 68 year old lifelong never smoker who presents for follow-up on the issue of mild intermittent asthma.  I last saw her on 04 September 2023.  At that time she was instructed to discontinue Singulair  as it was not effective.  HPI Discussed the use of AI scribe software for clinical note transcription with the patient, who gave verbal consent to proceed.  History of Present Illness   Kimberly Patton is a 68 year old female who presents for a follow-up visit.  Her asthma is well-controlled, and she has not used her Xopenex  inhaler in a couple of years. She requests a new inhaler for emergency use, as she is unsure of the current inhaler's efficacy. She does not use a maintenance inhaler.  Her sleep is interrupted due to nocturia, needing to urinate three times a night. She typically gets six to seven hours of sleep, which she considers normal. She has not been tested for sleep apnea and does not snore.  She attributes her nocturia to use of lymphedema boots prior to going to bed at night and fluid mobilization due to the same.  Her youngest son moved back home with a Corgi, initially causing respiratory issues. However, after purchasing an air purifier, her symptoms improved.  She undergoes iron  infusions  every six months due to malabsorption from multiple stomach surgeries. Her blood pressure is low after taking her medication but normalizes later in the day. She has consulted her cardiologist about this issue.  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 She now takes aspirin  daily and feels more energetic since discontinuing Eliquis .       Review of Systems A 10 point review of systems was performed and it is as noted above otherwise negative.   Patient Active Problem List   Diagnosis Date Noted   Burning mouth syndrome 12/17/2023   Labial irritation 12/03/2023   Sebaceous cyst of labia 06/27/2023   Iron  deficiency 05/23/2023   Prediabetes 01/08/2023   Abnormal metabolism 01/08/2023   Paroxysmal atrial fibrillation with RVR (HCC) 11/09/2022   HTN (hypertension) 11/09/2022   Chest pain with moderate risk for cardiac etiology 11/09/2022   History of lobectomy of thyroid  09/20/2022   Paroxysmal atrial fibrillation (HCC) 03/28/2022   Empty sella syndrome 03/28/2022   Varicose veins with inflammation 01/07/2022   S/P breast lumpectomy 10/10/2021   Cervical radiculopathy 06/28/2020   Chronic venous insufficiency 05/29/2020   Lymphedema 05/29/2020   CAD (coronary artery disease) 05/29/2020   Cervical lymphadenitis 04/18/2020   Dysuria 03/25/2020   History of repair of hiatal hernia 02/02/2020   Hypercholesteremia 06/09/2019   Chronic neuropathic pain 07/31/2018   Thyroid  nodule 04/15/2018   Age-related osteoporosis without current pathological fracture 12/04/2016   Osteoarthritis of knee 07/17/2016   Meningioma (HCC) 01/18/2016   Family history  of aortic stenosis 01/20/2015   Status post laparoscopic sleeve gastrectomy Dec 2014 06/08/2013   Class 3 severe obesity with serious comorbidity and body mass index (BMI) of 40.0 to 44.9 in adult 12/08/2012   Primary hypertension 10/11/2011   Varicose veins of bilateral lower extremities with  other complications 06/18/2011   FIBROIDS, UTERUS 04/03/2007   Asthma, mild intermittent 04/03/2007    Social History   Tobacco Use   Smoking status: Never   Smokeless tobacco: Never   Tobacco comments:    smoke at age [age]  Substance Use Topics   Alcohol use: Not Currently    Allergies  Allergen Reactions   Wasp Venom Anaphylaxis   Chlorhexidine  Gluconate     Itching under breasts   Metronidazole Other (See Comments)    Chest tightness, neck tightness   Prednisone  Other (See Comments)    Severe migraine / with taper dose Told by Cardiologist to avoid due to Afib   Other Rash    Bleach   Sodium Hypochlorite Rash    Liquid clorox bleach    Current Meds  Medication Sig   aspirin  EC 81 MG tablet Take 81 mg by mouth daily. Swallow whole.   Cholecalciferol (VITAMIN D3) 1.25 MG (50000 UT) CAPS TAKE 1 CAPSULE BY MOUTH ONE TIME PER WEEK   Coenzyme Q10-Vitamin E  (QUNOL ULTRA COQ10) 100-150 MG-UNIT CAPS Take 100 mg by mouth daily.   esomeprazole (NEXIUM) 40 MG capsule Take 40 mg by mouth daily.   levalbuterol  (XOPENEX  HFA) 45 MCG/ACT inhaler Inhale 2 puffs into the lungs every 6 (six) hours as needed for wheezing.   Menaquinone-7 (VITAMIN K2  PO) Take 100 mg by mouth daily.   metFORMIN  (GLUCOPHAGE -XR) 500 MG 24 hr tablet Take 1 tablet (500 mg total) by mouth 2 (two) times daily with a meal.   Multiple Vitamins-Minerals (CENTRUM SILVER 50+WOMEN PO) Take 1 tablet by mouth daily.   rosuvastatin  (CRESTOR ) 10 MG tablet Take 1 tablet (10 mg total) by mouth daily.   verapamil  (CALAN -SR) 120 MG CR tablet TAKE 1 TABLET BY MOUTH 2 TIMES DAILY.    Immunization History  Administered Date(s) Administered   Fluad Quad(high Dose 65+) 03/09/2021, 05/12/2022   Fluad Trivalent(High Dose 65+) 03/29/2023   Influenza Inj Mdck Quad Pf 02/13/2018   Influenza Whole 06/11/2005, 04/08/2009   Influenza,inj,Quad PF,6+ Mos 03/22/2017, 02/13/2018, 03/03/2019, 04/08/2020   Influenza-Unspecified  03/27/2016   PFIZER Comirnaty(Gray Top)Covid-19 Tri-Sucrose Vaccine 08/03/2020   PFIZER(Purple Top)SARS-COV-2 Vaccination 08/07/2019, 08/29/2019   PNEUMOCOCCAL CONJUGATE-20 03/09/2021   Pneumococcal Polysaccharide-23 03/22/2017   Td 06/11/2004   Tdap 01/20/2015        Objective:     BP 130/76   Pulse 70   Temp 98 F (36.7 C) (Temporal)   Ht 5' 6 (1.676 m)   Wt 268 lb 6.4 oz (121.7 kg)   SpO2 98%   BMI 43.32 kg/m   SpO2: 98 %  GENERAL: Obese woman, no acute distress, ambulatory with assistance of a cane.  No conversational dyspnea. HEAD: Normocephalic, atraumatic.  EYES: Pupils equal, round, reactive to light.  No scleral icterus.  MOUTH: Natural dentition, oral mucosa moist, no thrush. NECK: Supple.  Thyromegaly present particularly left lobe. Trachea midline. No JVD.  No adenopathy. PULMONARY: Good air entry bilaterally.  No adventitious sounds. CARDIOVASCULAR: S1 and S2. Regular rate and rhythm.  No rubs, murmurs or gallops heard. ABDOMEN: Obese otherwise benign. MUSCULOSKELETAL: No joint deformity, no clubbing, no edema. NEUROLOGIC: No focal deficit, no gait disturbance, speech is  fluent. SKIN: Intact,warm,dry.  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  Chronic stasis changes. PSYCH: Mood and behavior normal.         Assessment & Plan:     ICD-10-CM   1. Mild intermittent asthma without complication  J45.20     2. Paroxysmal atrial fibrillation with RVR (HCC)  I48.0     3. Lipedema of lower extremity  R60.0     4. Morbid obesity (HCC)  E66.01      Meds ordered this encounter  Medications   levalbuterol  (XOPENEX  HFA) 45 MCG/ACT inhaler    Sig: Inhale 2 puffs into the lungs every 6 (six) hours as needed for wheezing.    Dispense:  1 each    Refill:  2   Discussion:    Mild intermittent asthma She uses Xopenex  (levalbuterol ) as a rescue inhaler, which she has not used in a long time. She inquired about the expiration of her current inhaler and  requested a new one for emergency use. - Prescribe Xopenex  (levalbuterol ) inhaler for emergency use.  Lipedema Lipedema is present with no significant changes. She has explored potential treatments, including medications like Mounjaro , but her insurance does not cover it as she is prediabetic, not diabetic. She is cautious about the validity of social media claims regarding pills that break down fat.      Advised if symptoms do not improve or worsen, to please contact office for sooner follow up or seek emergency care.    I spent 32 minutes of dedicated to the care of this patient on the date of this encounter to include pre-visit review of records, face-to-face time with the patient discussing conditions above, post visit ordering of testing, clinical documentation with the electronic health record, making appropriate referrals as documented, and communicating necessary findings to members of the patients care team.     C. Leita Sanders, MD Advanced Bronchoscopy PCCM [facility]    *This note was generated using voice recognition software/Dragon and/or AI transcription program.  Despite best efforts to proofread, errors can occur which can change the meaning. Any transcriptional errors that result from this process are unintentional and may not be fully corrected at the time of dictation.

## 2024-03-12 DIAGNOSIS — R001 Bradycardia, unspecified: Secondary | ICD-10-CM | POA: Diagnosis not present

## 2024-03-13 ENCOUNTER — Ambulatory Visit: Payer: Self-pay | Admitting: Physician Assistant

## 2024-03-20 ENCOUNTER — Encounter: Payer: Self-pay | Admitting: Family Medicine

## 2024-03-24 ENCOUNTER — Encounter: Payer: Self-pay | Admitting: Family Medicine

## 2024-03-24 ENCOUNTER — Ambulatory Visit: Admitting: Family Medicine

## 2024-03-24 VITALS — BP 138/80 | HR 64 | Temp 98.5°F | Ht 66.0 in | Wt 271.1 lb

## 2024-03-24 DIAGNOSIS — Z23 Encounter for immunization: Secondary | ICD-10-CM | POA: Insufficient documentation

## 2024-03-24 DIAGNOSIS — R202 Paresthesia of skin: Secondary | ICD-10-CM

## 2024-03-24 DIAGNOSIS — Z6841 Body Mass Index (BMI) 40.0 and over, adult: Secondary | ICD-10-CM

## 2024-03-24 DIAGNOSIS — E66813 Obesity, class 3: Secondary | ICD-10-CM | POA: Diagnosis not present

## 2024-03-24 LAB — COMPREHENSIVE METABOLIC PANEL WITH GFR
ALT: 14 U/L (ref 0–35)
AST: 14 U/L (ref 0–37)
Albumin: 4.5 g/dL (ref 3.5–5.2)
Alkaline Phosphatase: 72 U/L (ref 39–117)
BUN: 17 mg/dL (ref 6–23)
CO2: 31 meq/L (ref 19–32)
Calcium: 9.9 mg/dL (ref 8.4–10.5)
Chloride: 100 meq/L (ref 96–112)
Creatinine, Ser: 0.53 mg/dL (ref 0.40–1.20)
GFR: 95.04 mL/min (ref >60.00)
Glucose, Bld: 86 mg/dL (ref 70–99)
Potassium: 4.3 meq/L (ref 3.5–5.1)
Sodium: 140 meq/L (ref 135–145)
Total Bilirubin: 0.6 mg/dL (ref 0.2–1.2)
Total Protein: 6.9 g/dL (ref 6.0–8.3)

## 2024-03-24 LAB — HEMOGLOBIN A1C: Hgb A1c MFr Bld: 5.5 % (ref 4.6–6.5)

## 2024-03-24 LAB — MAGNESIUM: Magnesium: 2 mg/dL (ref 1.5–2.5)

## 2024-03-24 LAB — TSH: TSH: 1.44 u[IU]/mL (ref 0.35–5.50)

## 2024-03-24 NOTE — Assessment & Plan Note (Signed)
 Acute, potentially related to chronic low back pain/herniated disc and likely osteoarthritis versus new electrolyte/vitamin disturbance versus compression of lower extremity nerves possibly related to  morbid obesity. I encouraged her to continue working on regular activity and exercise. We will evaluate with labs. She will consider following up with her back specialist.

## 2024-03-24 NOTE — Progress Notes (Signed)
 Patient ID: Kimberly Patton, female    DOB: Oct 17, 1955, 68 y.o.   MRN: [id number]  This visit was conducted in person.  BP 138/80   Pulse 64   Temp 98.5 F (36.9 C) (Temporal)   Ht 5' 6 (1.676 m)   Wt 271 lb 2 oz (123 kg)   SpO2 94%   BMI 43.76 kg/m    CC:  Chief Complaint  Patient presents with   Tingling    Bilateral Feet    Subjective:   HPI: Kimberly Patton is a 68 y.o. female  with history of obesity, HTN, venous insufficiency, parosysmal atrial fibrillation, presenting on 03/24/2024 for Tingling (Bilateral Feet)   Tingling in bilateral feet up to ankle.. inlast week.  Off and on.  No fall, no change in activity in last week. She did have deep massage in hips... ever since then tingling is worse.  No new medication.  No new back pain, stable low back pain  She has gained some weight back.   No diabetes  Nml TSH, CMET B12 in 12/2023.SABRA  Has had 2 iron  infusions.   She is going to [facility].   She is not interested in going back ... On metformin  XR 500 BID... she feels this was helpful when she first started it. Wt Readings from Last 3 Encounters:  03/24/24 271 lb 2 oz (123 kg)  03/06/24 268 lb 6.4 oz (121.7 kg)  02/18/24 270 lb 3.2 oz (122.6 kg)        Relevant past medical, surgical, family and social history reviewed and updated as indicated. Interim medical history since our last visit reviewed. Allergies and medications reviewed and updated. Outpatient Medications Prior to Visit  Medication Sig Dispense Refill   aspirin  EC 81 MG tablet Take 81 mg by mouth daily. Swallow whole.     Calcium  Citrate-Vitamin D  (CITRACAL + D PO) Take 2 tablets by mouth daily.      Cholecalciferol (VITAMIN D3) 1.25 MG (50000 UT) CAPS TAKE 1 CAPSULE BY MOUTH ONE TIME PER WEEK 12 capsule 0   Coenzyme Q10-Vitamin E  (QUNOL ULTRA COQ10) 100-150 MG-UNIT CAPS Take 100 mg by mouth daily.     Cyanocobalamin  (VITAMIN B-12) 2500 MCG SUBL Place 2,500 mcg  under the tongue daily.     diltiazem  (CARDIZEM ) 30 MG tablet Take 30 mg by mouth 2 (two) times daily as needed.     esomeprazole (NEXIUM) 20 MG capsule Take 20 mg by mouth daily at 12 noon.     furosemide  (LASIX ) 20 MG tablet Take 1 tablet (20 mg total) by mouth daily as needed. 90 tablet 3   Lactobacillus Rhamnosus, GG, (CULTURELLE IMMUNITY SUPPORT PO) Take 1 capsule by mouth daily.     levalbuterol  (XOPENEX  HFA) 45 MCG/ACT inhaler Inhale 2 puffs into the lungs every 6 (six) hours as needed for wheezing. 1 each 2   Menaquinone-7 (VITAMIN K2  PO) Take 100 mg by mouth daily.     metFORMIN  (GLUCOPHAGE -XR) 500 MG 24 hr tablet Take 1 tablet (500 mg total) by mouth 2 (two) times daily with a meal. 180 tablet 0   methocarbamol  (ROBAXIN ) 500 MG tablet Take 500 mg by mouth at bedtime as needed for muscle spasms.     Multiple Vitamins-Minerals (CENTRUM SILVER 50+WOMEN PO) Take 1 tablet by mouth daily.     nitroGLYCERIN  (NITROSTAT ) 0.4 MG SL tablet Place 1 tablet (0.4 mg total) under the tongue every 5 (five) minutes as needed for  chest pain. 25 tablet 3   Riboflavin  400 MG CAPS Take 400 mg by mouth daily.     rosuvastatin  (CRESTOR ) 10 MG tablet Take 1 tablet (10 mg total) by mouth daily. 90 tablet 3   spironolactone  (ALDACTONE ) 25 MG tablet Take 1 tablet (25 mg total) by mouth daily. 90 tablet 3   traMADol  (ULTRAM ) 50 MG tablet Take 1 tablet (50 mg total) by mouth every 6 (six) hours as needed. 12 tablet 0   TURMERIC PO Take 2 tablets by mouth daily. 500 mg  With Ginger 50 mg Gummies     verapamil  (CALAN -SR) 120 MG CR tablet TAKE 1 TABLET BY MOUTH 2 TIMES DAILY. 180 tablet 2   esomeprazole (NEXIUM) 40 MG capsule Take 40 mg by mouth daily.     Facility-Administered Medications Prior to Visit  Medication Dose Route Frequency Provider Last Rate Last Admin   albuterol  (PROVENTIL ) (2.5 MG/3ML) 0.083% nebulizer solution 2.5 mg  2.5 mg Nebulization Once Tamea Dedra CROME, MD         Per HPI unless  specifically indicated in ROS section below Review of Systems  Constitutional:  Negative for fatigue and fever.  HENT:  Negative for congestion.   Eyes:  Negative for pain.  Respiratory:  Negative for cough and shortness of breath.   Cardiovascular:  Negative for chest pain, palpitations and leg swelling.  Gastrointestinal:  Negative for abdominal pain.  Genitourinary:  Negative for dysuria and vaginal bleeding.  Musculoskeletal:  Positive for back pain.  Neurological:  Positive for numbness. Negative for syncope, light-headedness and headaches.  Psychiatric/Behavioral:  Negative for dysphoric mood.    Objective:  BP 138/80   Pulse 64   Temp 98.5 F (36.9 C) (Temporal)   Ht 5' 6 (1.676 m)   Wt 271 lb 2 oz (123 kg)   SpO2 94%   BMI 43.76 kg/m   Wt Readings from Last 3 Encounters:  03/24/24 271 lb 2 oz (123 kg)  03/06/24 268 lb 6.4 oz (121.7 kg)  02/18/24 270 lb 3.2 oz (122.6 kg)      Physical Exam    Results for orders placed or performed in visit on 01/29/24  Iron  and TIBC   Collection Time: 01/29/24 11:01 AM  Result Value Ref Range   Iron  59 28 - 170 ug/dL   TIBC 568 749 - 549 ug/dL   Saturation Ratios 14 10.4 - 31.8 %   UIBC 372 ug/dL  Ferritin   Collection Time: 01/29/24 11:01 AM  Result Value Ref Range   Ferritin 30 11 - 307 ng/mL  CBC (Cancer Center Only)   Collection Time: 01/29/24 11:01 AM  Result Value Ref Range   WBC Count 6.5 4.0 - 10.5 K/uL   RBC 4.44 3.87 - 5.11 MIL/uL   Hemoglobin 13.6 12.0 - 15.0 g/dL   HCT 59.0 63.9 - 53.9 %   MCV 92.1 80.0 - 100.0 fL   MCH 30.6 26.0 - 34.0 pg   MCHC 33.3 30.0 - 36.0 g/dL   RDW 87.6 88.4 - 84.4 %   Platelet Count 263 150 - 400 K/uL   nRBC 0.0 0.0 - 0.2 %   *Note: Due to a large number of results and/or encounters for the requested time period, some results have not been displayed. A complete set of results can be found in Results Review.    Assessment and Plan  Tingling of both feet Assessment &  Plan: Acute, potentially related to chronic low back pain/herniated disc and  likely osteoarthritis versus new electrolyte/vitamin disturbance versus compression of lower extremity nerves possibly related to  morbid obesity. I encouraged her to continue working on regular activity and exercise. We will evaluate with labs. She will consider following up with her back specialist.   Orders: -     Comprehensive metabolic panel with GFR -     TSH -     Magnesium  -     Hemoglobin A1c  Need for influenza vaccination -     Flu vaccine HIGH DOSE PF(Fluzone Trivalent)  Class 3 severe obesity with serious comorbidity and body mass index (BMI) of 40.0 to 44.9 in adult, unspecified obesity type [facility]) Assessment & Plan: Chronic, she is interested in weaning off the metformin  as she felt it was initially helpful with her appetite and weight management but has no longer been effective. After review on medication options with her healthy weight and wellness doctor, they have decided that there are no medication options for her given they are either contraindicated or not covered by her insurance.     No follow-ups on file.   Greig Ring, MD

## 2024-03-24 NOTE — Assessment & Plan Note (Signed)
 Chronic, she is interested in weaning off the metformin  as she felt it was initially helpful with her appetite and weight management but has no longer been effective. After review on medication options with her healthy weight and wellness doctor, they have decided that there are no medication options for her given they are either contraindicated or not covered by her insurance.

## 2024-03-25 ENCOUNTER — Telehealth: Payer: Self-pay | Admitting: Cardiology

## 2024-03-25 ENCOUNTER — Ambulatory Visit: Payer: Self-pay | Admitting: Family Medicine

## 2024-03-25 ENCOUNTER — Other Ambulatory Visit: Payer: Self-pay | Admitting: Family Medicine

## 2024-03-25 NOTE — Telephone Encounter (Signed)
 Patient called in reporting she mistakenly took 2 verapamil  120mg  tablets this morning instead of her usual one tablet. BP actually elevated while on the phone as she reports being anxious. I advised to monitor BP throughout the morning and increase her PO intake. Change positions slowly. If significantly symptomatic then may need evaluation at UC/ED for IVFs as she reports her BP runs on the lower side generally. If BP/HR within her normal ranges then ok to take evening dose, otherwise hold and resume tomorrow. She voiced understanding and thanked me for callback.

## 2024-03-29 ENCOUNTER — Encounter: Payer: Self-pay | Admitting: Family Medicine

## 2024-03-31 ENCOUNTER — Encounter: Payer: Self-pay | Admitting: Family Medicine

## 2024-03-31 ENCOUNTER — Telehealth: Admitting: Family Medicine

## 2024-03-31 VITALS — Ht 66.0 in

## 2024-03-31 DIAGNOSIS — J0100 Acute maxillary sinusitis, unspecified: Secondary | ICD-10-CM

## 2024-03-31 MED ORDER — AMOXICILLIN 500 MG PO CAPS: 1000.0000 mg | ORAL_CAPSULE | Freq: Two times a day (BID) | ORAL | 0 refills | Status: DC

## 2024-03-31 NOTE — Progress Notes (Signed)
 VIRTUAL VISIT A virtual visit is felt to be most appropriate for this patient at this time.   I connected with the patient on 04/19/24 at 12:00 PM EDT by virtual telehealth platform and verified that I am speaking with the correct person using two identifiers.   I discussed the limitations, risks, security and privacy concerns of performing an evaluation and management service by  virtual telehealth platform and the availability of in person appointments. I also discussed with the patient that there may be a patient responsible charge related to this service. The patient expressed understanding and agreed to proceed.  Patient location: Home Provider Location: [name] Participants: Kimberly Patton and Kimberly Patton   Chief Complaint  Patient presents with   Facial Pressure    Using Tylenol  Sinus Severe and heat with no relief    History of Present Illness:  68 y.o. female patient of Kimberly Call E, MD presents with sinus pressure    Date of onset: 5-6 days of symptoms Initial symptoms included  sinus pressure in forehead, around eye Symptoms progressed to nasal congestion.  Ear fullness Has post nasal drip  No fever  No cough.  No wheeze, no SOB.   Sick contacts: multiple COVID testing:    yes, negative    She has tried to treat with tylenol  severe sinus, flonase     No history of chronic lung disease such as asthma or COPD. Non-smoker.   Cannot use  prednisone  given  triggers afib in past.   COVID 19 screen No recent travel or known exposure to COVID19 The patient denies respiratory symptoms of COVID 19 at this time.  The importance of social distancing was discussed today.   Review of Systems  Constitutional:  Negative for chills and fever.  HENT:  Negative for congestion and ear pain.   Eyes:  Negative for pain and redness.  Respiratory:  Negative for cough and shortness of breath.   Cardiovascular:  Negative for chest pain, palpitations and leg  swelling.  Gastrointestinal:  Negative for abdominal pain, blood in stool, constipation, diarrhea, nausea and vomiting.  Genitourinary:  Negative for dysuria.  Musculoskeletal:  Negative for falls and myalgias.  Skin:  Negative for rash.  Neurological:  Negative for dizziness.  Psychiatric/Behavioral:  Negative for depression. The patient is not nervous/anxious.       Past Medical History:  Diagnosis Date   ALLERGIC RHINITIS 04/03/2007   Allergy    Not sure   Anemia    ((Pt Qnr Sub: Denies at visit from 08/28/2021 with endocrinology))    Anginal pain    Anxiety    Arthritis Not sure   Asthma    hx of years ago - no longer a problem    ASTHMA, PERSISTENT, MODERATE 04/03/2007   Back pain    Chest pain    Depression screen 01/08/2023   Dyspnea    with exertion   Dyspnea on exertion    a. 11/2007 Echo: EF 60%.   Dysrhythmia    hx of heart arrhythmia 10-15 years ago - followed by DR Patton - not seen in years    Empty sella syndrome    GERD (gastroesophageal reflux disease)    Headache    Hemophilia carrier    Hiatal hernia    History of kidney stones    Hypercholesteremia 06/09/2019   Hypertension    Joint pain    HIPS / LEGS   Joint pain    Knee injury  RT   Lymph edema    bilateral legs   Meningioma (HCC)    Morbid obesity (HCC)    Neuromuscular disorder (HCC) Not sure, since my fall   Occipital neuralgia    Osteoarthritis    Palpitations    Paroxysmal SVT (supraventricular tachycardia)    a. 11/2011 48h Holter: RSR, rare PVC's, occas PAC's   Retinal tear of right eye 01/2016   Skin cancer    SOB (shortness of breath)    Thyroid  nodule 04/15/2018   Ventral hernia     reports that she has never smoked. She has never used smokeless tobacco. She reports that she does not currently use alcohol. She reports that she does not use drugs.   Current Outpatient Medications:    amoxicillin  (AMOXIL ) 500 MG capsule, Take 2 capsules (1,000 mg total) by mouth 2 (two)  times daily. (Patient not taking: Reported on 04/16/2024), Disp: 40 capsule, Rfl: 0   aspirin  EC 81 MG tablet, Take 81 mg by mouth daily. Swallow whole., Disp: , Rfl:    Calcium  Citrate-Vitamin D  (CITRACAL + D PO), Take 2 tablets by mouth daily. , Disp: , Rfl:    Cholecalciferol (VITAMIN D3) 1.25 MG (50000 UT) CAPS, TAKE 1 CAPSULE BY MOUTH ONE TIME PER WEEK, Disp: 12 capsule, Rfl: 0   Coenzyme Q10-Vitamin Patton  (QUNOL ULTRA COQ10) 100-150 MG-UNIT CAPS, Take 100 mg by mouth daily., Disp: , Rfl:    Cyanocobalamin  (VITAMIN B-12) 2500 MCG SUBL, Place 2,500 mcg under the tongue daily., Disp: , Rfl:    diltiazem  (CARDIZEM ) 30 MG tablet, Take 30 mg by mouth 2 (two) times daily as needed., Disp: , Rfl:    esomeprazole (NEXIUM) 20 MG capsule, Take 20 mg by mouth daily at 12 noon., Disp: , Rfl:    furosemide  (LASIX ) 20 MG tablet, Take 1 tablet (20 mg total) by mouth daily as needed., Disp: 90 tablet, Rfl: 3   Lactobacillus Rhamnosus, GG, (CULTURELLE IMMUNITY SUPPORT PO), Take 1 capsule by mouth daily., Disp: , Rfl:    levalbuterol  (XOPENEX  HFA) 45 MCG/ACT inhaler, Inhale 2 puffs into the lungs every 6 (six) hours as needed for wheezing., Disp: 1 each, Rfl: 2   Menaquinone-7 (VITAMIN K2  PO), Take 100 mg by mouth daily., Disp: , Rfl:    methocarbamol  (ROBAXIN ) 500 MG tablet, Take 500 mg by mouth at bedtime as needed for muscle spasms., Disp: , Rfl:    Multiple Vitamins-Minerals (CENTRUM SILVER 50+WOMEN PO), Take 1 tablet by mouth daily., Disp: , Rfl:    nitroGLYCERIN  (NITROSTAT ) 0.4 MG SL tablet, Place 1 tablet (0.4 mg total) under the tongue every 5 (five) minutes as needed for chest pain., Disp: 25 tablet, Rfl: 3   Riboflavin  400 MG CAPS, Take 400 mg by mouth daily., Disp: , Rfl:    rosuvastatin  (CRESTOR ) 10 MG tablet, Take 1 tablet (10 mg total) by mouth daily., Disp: 90 tablet, Rfl: 3   spironolactone  (ALDACTONE ) 25 MG tablet, Take 1 tablet (25 mg total) by mouth daily., Disp: 90 tablet, Rfl: 3   traMADol   (ULTRAM ) 50 MG tablet, Take 1 tablet (50 mg total) by mouth every 6 (six) hours as needed., Disp: 12 tablet, Rfl: 0   TURMERIC PO, Take 2 tablets by mouth daily. 500 mg  With Ginger 50 mg Gummies, Disp: , Rfl:    verapamil  (CALAN -SR) 120 MG CR tablet, TAKE 1 TABLET BY MOUTH 2 TIMES DAILY., Disp: 180 tablet, Rfl: 2 No current facility-administered medications for this visit.  Facility-Administered  Medications Ordered in Other Visits:    albuterol  (PROVENTIL ) (2.5 MG/3ML) 0.083% nebulizer solution 2.5 mg, 2.5 mg, Nebulization, Once, Tamea Dedra CROME, MD   Observations/Objective: Height 5' 6 (1.676 m).  Physical Exam Constitutional:      General: The patient is not in acute distress. Pulmonary:     Effort: Pulmonary effort is normal. No respiratory distress.  Neurological:     Mental Status: The patient is alert and oriented to person, place, and time.  Psychiatric:        Mood and Affect: Mood normal.        Behavior: Behavior normal.    Assessment and Plan Acute non-recurrent maxillary sinusitis Assessment & Plan: Acute, likely initial viral infection, now with concern for bacterial superinfection. Recommend nasal steroid spray 2 sprays per nostril daily, nasal saline irrigation.  Will treat with amoxicillin  2 tablets twice daily x 10 days. Additional symptomatic and supportive care reviewed.  Return and ER precautions provided.   Other orders -     Amoxicillin ; Take 2 capsules (1,000 mg total) by mouth 2 (two) times daily. (Patient not taking: Reported on 04/16/2024)  Dispense: 40 capsule; Refill: 0      I discussed the assessment and treatment plan with the patient. The patient was provided an opportunity to ask questions and all were answered. The patient agreed with the plan and demonstrated an understanding of the instructions.   The patient was advised to call back or seek an in-person evaluation if the symptoms worsen or if the condition fails to improve as  anticipated.     Kimberly Ring, MD

## 2024-04-03 DIAGNOSIS — S9031XA Contusion of right foot, initial encounter: Secondary | ICD-10-CM | POA: Diagnosis not present

## 2024-04-13 ENCOUNTER — Encounter: Payer: Self-pay | Admitting: Radiology

## 2024-04-13 ENCOUNTER — Telehealth (INDEPENDENT_AMBULATORY_CARE_PROVIDER_SITE_OTHER): Payer: Self-pay | Admitting: Vascular Surgery

## 2024-04-13 NOTE — Telephone Encounter (Signed)
 Patient called stating her swelling in legs is getting worse and she would like to see the doctor. US  last done in June. Please advise what should be scheduled for the patient.

## 2024-04-13 NOTE — Telephone Encounter (Signed)
 Spoke with patient she states she has been elevating as she can and wearing her compressions, but the swelling is just not under control at this time. She does want to be scheduled for reflux studies at this time.

## 2024-04-13 NOTE — Telephone Encounter (Signed)
 She can have reflux studies, but she has a hx of lymphedema and should be wearing compression and elevating her lower extremities to help it

## 2024-04-16 ENCOUNTER — Ambulatory Visit

## 2024-04-16 ENCOUNTER — Encounter
Admission: RE | Disposition: A | Discharge status: Home / Self Care | Payer: Self-pay | Source: Home / Self Care | Attending: Gastroenterology

## 2024-04-16 ENCOUNTER — Ambulatory Visit
Admission: RE | Admit: 2024-04-16 | Discharge: 2024-04-16 | Disposition: A | Discharge status: Home / Self Care | Attending: Gastroenterology | Admitting: Gastroenterology

## 2024-04-16 ENCOUNTER — Encounter: Payer: Self-pay | Admitting: Gastroenterology

## 2024-04-16 DIAGNOSIS — I10 Essential (primary) hypertension: Secondary | ICD-10-CM | POA: Insufficient documentation

## 2024-04-16 DIAGNOSIS — I2510 Atherosclerotic heart disease of native coronary artery without angina pectoris: Secondary | ICD-10-CM | POA: Diagnosis not present

## 2024-04-16 DIAGNOSIS — E66813 Obesity, class 3: Secondary | ICD-10-CM | POA: Diagnosis not present

## 2024-04-16 DIAGNOSIS — I480 Paroxysmal atrial fibrillation: Secondary | ICD-10-CM | POA: Diagnosis not present

## 2024-04-16 DIAGNOSIS — K219 Gastro-esophageal reflux disease without esophagitis: Secondary | ICD-10-CM | POA: Diagnosis not present

## 2024-04-16 DIAGNOSIS — Z1211 Encounter for screening for malignant neoplasm of colon: Secondary | ICD-10-CM | POA: Insufficient documentation

## 2024-04-16 DIAGNOSIS — Z860101 Personal history of adenomatous and serrated colon polyps: Secondary | ICD-10-CM | POA: Insufficient documentation

## 2024-04-16 DIAGNOSIS — Z09 Encounter for follow-up examination after completed treatment for conditions other than malignant neoplasm: Secondary | ICD-10-CM | POA: Diagnosis not present

## 2024-04-16 HISTORY — PX: COLONOSCOPY: SHX5424

## 2024-04-16 SURGERY — COLONOSCOPY
Anesthesia: General

## 2024-04-16 MED ORDER — SODIUM CHLORIDE 0.9 % IV SOLN
INTRAVENOUS | Status: DC
  Administered 2024-04-16: 500 mL via INTRAVENOUS

## 2024-04-16 MED ORDER — GLYCOPYRROLATE 0.2 MG/ML IJ SOLN
INTRAMUSCULAR | Status: DC | PRN
  Administered 2024-04-16: .1 mg via INTRAVENOUS

## 2024-04-16 MED ORDER — LIDOCAINE HCL (CARDIAC) PF 100 MG/5ML IV SOSY
PREFILLED_SYRINGE | INTRAVENOUS | Status: DC | PRN
  Administered 2024-04-16: 40 mg via INTRAVENOUS

## 2024-04-16 MED ORDER — PROPOFOL 500 MG/50ML IV EMUL
INTRAVENOUS | Status: DC | PRN
  Administered 2024-04-16: 150 ug/kg/min via INTRAVENOUS

## 2024-04-16 MED ORDER — DEXMEDETOMIDINE HCL IN NACL 80 MCG/20ML IV SOLN
INTRAVENOUS | Status: DC | PRN
Start: 2024-04-16 — End: 2024-04-16
  Administered 2024-04-16: 4 ug via INTRAVENOUS
  Administered 2024-04-16: 8 ug via INTRAVENOUS

## 2024-04-16 MED ORDER — PROPOFOL 10 MG/ML IV BOLUS
INTRAVENOUS | Status: DC | PRN
  Administered 2024-04-16 (×2): 50 mg via INTRAVENOUS

## 2024-04-16 NOTE — Anesthesia Preprocedure Evaluation (Signed)
 Anesthesia Evaluation  Patient identified by MRN, date of birth, ID band Patient awake    Reviewed: Allergy & Precautions, H&P , NPO status , Patient's Chart, lab work & pertinent test results, reviewed documented beta blocker date and time   Airway Mallampati: II   Neck ROM: full    Dental  (+) Poor Dentition   Pulmonary shortness of breath and with exertion, asthma    Pulmonary exam normal        Cardiovascular Exercise Tolerance: Poor hypertension, On Medications + angina with exertion + CAD  + dysrhythmias Supra Ventricular Tachycardia  Rhythm:regular Rate:Normal     Neuro/Psych  Headaches  Anxiety      Neuromuscular disease  negative psych ROS   GI/Hepatic Neg liver ROS, hiatal hernia,GERD  Medicated,,  Endo/Other    Class 3 obesity  Renal/GU negative Renal ROS  negative genitourinary   Musculoskeletal   Abdominal   Peds  Hematology  (+) Blood dyscrasia, anemia   Anesthesia Other Findings Past Medical History: 04/03/2007: ALLERGIC RHINITIS No date: Allergy     Comment:  Not sure No date: Anemia     Comment:  ((Pt Qnr Sub: Denies at visit from 08/28/2021 with               endocrinology))  No date: Anginal pain No date: Anxiety Not sure: Arthritis No date: Asthma     Comment:  hx of years ago - no longer a problem  04/03/2007: ASTHMA, PERSISTENT, MODERATE No date: Back pain No date: Chest pain 01/08/2023: Depression screen No date: Dyspnea     Comment:  with exertion No date: Dyspnea on exertion     Comment:  a. 11/2007 Echo: EF 60%. 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 11/2011 48h Holter: RSR, rare PVC's, occas PAC's 01/2016: Retinal tear of right eye No date: Skin cancer No date: SOB (shortness of breath) 04/15/2018: Thyroid  nodule No date: Ventral hernia Past Surgical History: Think 1994: ABDOMINAL HYSTERECTOMY 1996: APPENDECTOMY 03/21/2022: ARTERY BIOPSY; Left     Comment:  Procedure: BIOPSY TEMPORAL ARTERY;  Surgeon: Jama Cordella MATSU, MD;  Location: [facility];  Service: Vascular;                Laterality: Left; No date: BIOPSY THYROID      Comment:  08/2021 1994: BREAST CYST EXCISION; Right     Comment:  Benign 11/12/2022: CARDIOVERSION; N/A     Comment:  Procedure: CARDIOVERSION;  Surgeon: Perla Evalene PARAS,               MD;  Location: [facility];  Service: Cardiovascular;                Laterality: N/A; No date: CESAREAN SECTION     Comment:  x2 1995: CHOLECYSTECTOMY 05/09/2021: COLONOSCOPY WITH PROPOFOL ; N/A     Comment:  Procedure: COLONOSCOPY WITH PROPOFOL ;  Surgeon: Therisa Bi, MD;  Location: [facility];  Service:               Gastroenterology;  Laterality: N/A; 11/02/2022: ESOPHAGOGASTRODUODENOSCOPY (EGD) WITH PROPOFOL ; N/A     Comment:  Procedure: ESOPHAGOGASTRODUODENOSCOPY (EGD) WITH               PROPOFOL ;  Surgeon: Maryruth Ole DASEN, MD;  Location:               [facility] END[facility];  Service: Endoscopy;  Laterality: N/A; 01/2016: EYE SURGERY     Comment:  Repair retinal tear 1995 / 1996: FOOT SURGERY     Comment:  x2 No date: HERNIA REPAIR 07/25/2013: KNEE ARTHROSCOPY W/  MENISCAL REPAIR     Comment:  right knee Dr. Josefina 06/08/2013: LAPAROSCOPIC GASTRIC SLEEVE RESECTION; N/A     Comment:  Procedure: LAPAROSCOPIC GASTRIC SLEEVE RESECTION AND               EXCISION OF SEBACEUS CYST FROM MID CHEST takedown of               incarcerated ventral hernia and primary repair endoscopy;              Surgeon: Donnice KATHEE Lunger, MD;  Location: [facility];                Service: General;  Laterality: N/A; 02/02/2020: LAPAROSCOPIC NISSEN FUNDOPLICATION; N/A     Comment:  Procedure: LAPAROSCOPIC REPAIR LARGE SYMPTOMATIC HIATAL               HERNIA WITH UPPER ENDOSCOPY;  Surgeon: Lunger Donnice,               MD;  Location: [facility];  Service: General;  Laterality:               N/A; 06/2013: moles     Comment:  removed 2 moles from under arm and  lowback No date: NECK SURGERY     Comment:  occipital nerve damage- injections  1970: OVARIAN CYST REMOVAL 1975: PILONIDAL CYST EXCISION 06/23/2021: RADIOACTIVE SEED GUIDED EXCISIONAL BREAST BIOPSY; Right     Comment:  Procedure: RADIOACTIVE SEED GUIDED EXCISIONAL RIGHT               BREAST BIOPSY;  Surgeon: Lunger Donnice, MD;  Location:               [facility];  Service: General;                Laterality: Right; 01/2016: RETINAL TEAR REPAIR CRYOTHERAPY; Right     Comment:  [place] No date: SKIN CANCER EXCISION 11/12/2022: TEE WITHOUT CARDIOVERSION; N/A     Comment:  Procedure: TRANSESOPHAGEAL ECHOCARDIOGRAM;  Surgeon:               Perla Evalene PARAS, MD;  Location: [facility];  Service:               Cardiovascular;  Laterality: N/A; 08/21/2022: THYROID  LOBECTOMY; Left     Comment:  Procedure: LEFT THYROID  LOBECTOMY;  Surgeon: Eletha,  Krystal, MD;  Location: [facility];  Service: General;                Laterality: Left; No date: THYROIDECTOMY, PARTIAL No date: TONSILLECTOMY Think 1994: TUBAL LIGATION 06/08/2013: UPPER GI ENDOSCOPY     Comment:  Procedure: UPPER GI ENDOSCOPY;  Surgeon: Donnice KATHEE Lunger, MD;  Location: [facility];  Service: General;; 12/2011: US  ECHOCARDIOGRAPHY     Comment:  WNL - EF 55-60%, mild MR, grade 1 diastolic dysnfiction               (mild) 2000: VENTRAL HERNIA REPAIR BMI    Body Mass Index: 43.16 kg/m     Reproductive/Obstetrics negative OB ROS                              Anesthesia Physical Anesthesia Plan  ASA: 3  Anesthesia Plan: General   Post-op Pain Management:    Induction:   PONV Risk Score and Plan:   Airway Management Planned:   Additional Equipment:   Intra-op Plan:   Post-operative Plan:   Informed Consent: I have reviewed the patients History and Physical, chart, labs and discussed the procedure including the risks, benefits and alternatives for the proposed anesthesia with the patient or authorized representative who has indicated his/her understanding and acceptance.     Dental Advisory Given  Plan Discussed with: CRNA  Anesthesia Plan Comments:         Anesthesia Quick Evaluation

## 2024-04-16 NOTE — H&P (Signed)
 Ruel Kung , MD [address], [place], [place], [address] Phone: [phone] Fax: [phone]  Primary Care Physician:  Avelina Greig BRAVO, MD   Pre-Procedure History & Physical: HPI:  Kimberly Patton is a 68 y.o. female is here for an colonoscopy.   Past Medical History:  Diagnosis Date   ALLERGIC RHINITIS 04/03/2007   Allergy    Not sure   Anemia    ((Pt Qnr Sub: Denies at visit from 08/28/2021 with endocrinology))    Anginal pain    Anxiety    Arthritis Not sure   Asthma    hx of years ago - no longer a problem    ASTHMA, PERSISTENT, MODERATE 04/03/2007   Back pain    Chest pain    Depression screen 01/08/2023   Dyspnea    with exertion   Dyspnea on exertion    a. 11/2007 Echo: EF 60%.   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 11/2011 48h Holter: RSR, rare PVC's, occas PAC's   Retinal tear of right eye 01/2016   Skin cancer    SOB (shortness of breath)    Thyroid  nodule 04/15/2018   Ventral hernia     Past Surgical History:  Procedure Laterality Date   ABDOMINAL HYSTERECTOMY  Think 1994   APPENDECTOMY  1996   ARTERY BIOPSY Left 03/21/2022   Procedure: BIOPSY TEMPORAL ARTERY;  Surgeon: Jama Cordella MATSU, MD;  Location: [facility];  Service: Vascular;  Laterality: Left;   BIOPSY THYROID      08/2021   BREAST CYST EXCISION Right 1994   Benign   CARDIOVERSION N/A 11/12/2022   Procedure:  CARDIOVERSION;  Surgeon: Perla Evalene PARAS, MD;  Location: [facility];  Service: Cardiovascular;  Laterality: N/A;   CESAREAN SECTION     x2   CHOLECYSTECTOMY  1995   COLONOSCOPY WITH PROPOFOL  N/A 05/09/2021   Procedure: COLONOSCOPY WITH PROPOFOL ;  Surgeon: Kung Ruel, MD;  Location: [facility];  Service: Gastroenterology;  Laterality: N/A;   ESOPHAGOGASTRODUODENOSCOPY (EGD) WITH PROPOFOL  N/A 11/02/2022   Procedure: ESOPHAGOGASTRODUODENOSCOPY (EGD) WITH PROPOFOL ;  Surgeon: Maryruth Ole DASEN, MD;  Location: [facility];  Service: Endoscopy;  Laterality: N/A;   EYE SURGERY  01/2016   Repair retinal tear   FOOT SURGERY  1995 / 1996   x2   HERNIA REPAIR     KNEE ARTHROSCOPY W/ MENISCAL REPAIR  07/25/2013   right knee Dr. Josefina   LAPAROSCOPIC GASTRIC SLEEVE RESECTION N/A 06/08/2013   Procedure: LAPAROSCOPIC GASTRIC SLEEVE RESECTION AND EXCISION OF SEBACEUS CYST FROM MID CHEST takedown of incarcerated ventral hernia and primary repair endoscopy;  Surgeon: Donnice KATHEE Lunger, MD;  Location: [facility];  Service: General;  Laterality: N/A;  LAPAROSCOPIC NISSEN FUNDOPLICATION N/A 02/02/2020   Procedure: LAPAROSCOPIC REPAIR LARGE SYMPTOMATIC HIATAL HERNIA WITH UPPER ENDOSCOPY;  Surgeon: Gladis Cough, MD;  Location: [facility];  Service: General;  Laterality: N/A;   moles  06/2013   removed 2 moles from under arm and  lowback   NECK SURGERY     occipital nerve damage- injections    OVARIAN CYST REMOVAL  1970   PILONIDAL CYST EXCISION  1975   RADIOACTIVE SEED GUIDED EXCISIONAL BREAST BIOPSY Right 06/23/2021   Procedure: RADIOACTIVE SEED GUIDED EXCISIONAL RIGHT BREAST BIOPSY;  Surgeon: Gladis Cough, MD;  Location: [facility];  Service: General;  Laterality: Right;   RETINAL TEAR REPAIR CRYOTHERAPY Right 01/2016   [place]   SKIN CANCER EXCISION     TEE WITHOUT CARDIOVERSION N/A 11/12/2022   Procedure: TRANSESOPHAGEAL ECHOCARDIOGRAM;  Surgeon: Perla Evalene PARAS, MD;  Location: [facility]  [facility];  Service: Cardiovascular;  Laterality: N/A;   THYROID  LOBECTOMY Left 08/21/2022   Procedure: LEFT THYROID  LOBECTOMY;  Surgeon: Eletha Boas, MD;  Location: [facility];  Service: General;  Laterality: Left;   THYROIDECTOMY, PARTIAL     TONSILLECTOMY     TUBAL LIGATION  Think 1994   UPPER GI ENDOSCOPY  06/08/2013   Procedure: UPPER GI ENDOSCOPY;  Surgeon: Cough KATHEE Gladis, MD;  Location: [facility];  Service: General;;   US  ECHOCARDIOGRAPHY  12/2011   WNL - EF 55-60%, mild MR, grade 1 diastolic dysnfiction (mild)   VENTRAL HERNIA REPAIR  2000    Prior to Admission medications   Medication Sig Start Date End Date Taking? Authorizing Provider  aspirin  EC 81 MG tablet Take 81 mg by mouth daily. Swallow whole.   Yes [provider]  Calcium  Citrate-Vitamin D  (CITRACAL + D PO) Take 2 tablets by mouth daily.    Yes [provider]  Cholecalciferol (VITAMIN D3) 1.25 MG (50000 UT) CAPS TAKE 1 CAPSULE BY MOUTH ONE TIME PER WEEK 03/03/24  Yes Bedsole, Amy E, MD  Coenzyme Q10-Vitamin E  (QUNOL ULTRA COQ10) 100-150 MG-UNIT CAPS Take 100 mg by mouth daily.   Yes [provider]  Cyanocobalamin  (VITAMIN B-12) 2500 MCG SUBL Place 2,500 mcg under the tongue daily.   Yes [provider]  diltiazem  (CARDIZEM ) 30 MG tablet Take 30 mg by mouth 2 (two) times daily as needed.   Yes [provider]  esomeprazole (NEXIUM) 20 MG capsule Take 20 mg by mouth daily at 12 noon.   Yes [provider]  Lactobacillus Rhamnosus, GG, (CULTURELLE IMMUNITY SUPPORT PO) Take 1 capsule by mouth daily.   Yes [provider]  levalbuterol  (XOPENEX  HFA) 45 MCG/ACT inhaler Inhale 2 puffs into the lungs every 6 (six) hours as needed for wheezing. 03/06/24 03/06/25 Yes Tamea Dedra CROME, MD  Menaquinone-7 (VITAMIN K2  PO) Take 100 mg by mouth daily.   Yes [provider]  methocarbamol  (ROBAXIN ) 500 MG tablet Take 500 mg by mouth at bedtime as needed for muscle spasms.  06/14/22  Yes [provider]  Multiple Vitamins-Minerals (CENTRUM SILVER 50+WOMEN PO) Take 1 tablet by mouth daily.   Yes [provider]  Riboflavin  400 MG CAPS Take 400 mg by mouth daily. 07/26/21  Yes [provider]  spironolactone  (ALDACTONE ) 25 MG tablet Take 1 tablet (25 mg total) by mouth daily. 08/09/23 08/03/24 Yes Agbor-Etang, Redell, MD  traMADol  (ULTRAM ) 50 MG tablet Take 1 tablet (50 mg total) by mouth every 6 (six) hours as needed. 08/22/22  Yes Eletha Boas, MD  TURMERIC PO Take 2 tablets by mouth daily. 500 mg  With Ginger 50 mg Gummies   Yes [provider]  verapamil  (CALAN -SR) 120 MG CR tablet TAKE 1 TABLET BY MOUTH 2 TIMES DAILY. 12/11/23  Yes Cindie Ole DASEN, MD  amoxicillin  (AMOXIL ) 500 MG capsule Take 2 capsules (1,000 mg total) by mouth 2 (two) times daily. Patient not taking: Reported on 04/16/2024 03/31/24   Avelina Greig BRAVO, MD  furosemide  (LASIX ) 20 MG tablet Take 1 tablet (20 mg total) by mouth daily as needed. 08/15/23 03/31/24  Darliss Rogue, MD  nitroGLYCERIN  (NITROSTAT ) 0.4 MG SL tablet Place 1 tablet (0.4 mg total) under the tongue every 5 (five) minutes as needed for chest pain. 05/17/22   Darliss Rogue, MD  rosuvastatin  (CRESTOR ) 10 MG tablet Take 1 tablet (10 mg total) by mouth daily. 06/19/23   Darliss Rogue, MD    Allergies as of 03/07/2024 - Review Complete 03/06/2024  Allergen Reaction Noted   Wasp venom Anaphylaxis 07/17/2016   Chlorhexidine  gluconate  02/02/2020   Metronidazole Other (See Comments) 04/18/2020   Prednisone  Other (See Comments) 02/01/2017   Other Rash 10/26/2022   Sodium hypochlorite Rash 07/17/2016    Family History  Problem Relation Age of Onset   Breast cancer Mother    Diabetes Mother    Hypertension Mother    Kidney failure Mother    Heart disease Mother    Obesity Mother    Diabetes Father    Hypertension Father    Heart disease Father    Diabetes Other    Hypertension Other     Stroke Other    Hemophilia Other     Social History   Socioeconomic History   Marital status: Divorced    Spouse name: Not on file   Number of children: 2   Years of education: 17   Highest education level: Bachelor's degree (e.g., BA, AB, BS)  Occupational History   Occupation: Magazine Features Editor: [employer]   Occupation: Retired Runner, Broadcasting/film/video  Tobacco Use   Smoking status: Never   Smokeless tobacco: Never   Tobacco comments:    smoke at age 5-12  Vaping Use   Vaping status: Never Used  Substance and Sexual Activity   Alcohol use: Not Currently   Drug use: No   Sexual activity: Not Currently    Partners: Male    Birth control/protection: Abstinence, Pill  Other Topics Concern   Not on file  Social History Narrative   Lives at home alone.   Right-handed.   Occasional caffeine use.   Social Drivers of Corporate Investment Banker Strain: Low Risk  (03/21/2024)   Overall Financial Resource Strain (CARDIA)    Difficulty of Paying Living Expenses: Not hard at all  Food Insecurity: No Food Insecurity (03/21/2024)   Hunger Vital Sign    Worried About Running Out of Food in the Last Year: Never true    Ran Out of Food in the Last Year: Never true  Transportation Needs: No Transportation Needs (03/21/2024)   PRAPARE - Administrator, Civil Service (Medical): No    Lack of Transportation (Non-Medical): No  Physical Activity: Inactive (03/21/2024)   Exercise Vital Sign    Days of Exercise per Week: 0 days    Minutes of Exercise per Session: Not on file  Stress: No Stress Concern Present (03/21/2024)   [employer] of Occupational Health - Occupational Stress Questionnaire    Feeling of Stress: Not at  all  Social Connections: Moderately Integrated (03/21/2024)   Social Connection and Isolation Panel    Frequency of Communication with Friends and Family: More than three times a week    Frequency of Social Gatherings with Friends and Family:  Three times a week    Attends Religious Services: More than 4 times per year    Active Member of Clubs or Organizations: Yes    Attends Banker Meetings: More than 4 times per year    Marital Status: Divorced  Intimate Partner Violence: Not At Risk (05/31/2023)   Humiliation, Afraid, Rape, and Kick questionnaire    Fear of Current or Ex-Partner: No    Emotionally Abused: No    Physically Abused: No    Sexually Abused: No    Review of Systems: See HPI, otherwise negative ROS  Physical Exam: BP (!) 157/82   Pulse 60   Temp (!) 96.6 F (35.9 C) (Temporal)   Resp 18   Ht 5' 6 (1.676 m)   Wt 121.3 kg   SpO2 100%   BMI 43.16 kg/m  General:   Alert,  pleasant and cooperative in NAD Head:  Normocephalic and atraumatic. Neck:  Supple; no masses or thyromegaly. Lungs:  Clear throughout to auscultation, normal respiratory effort.    Heart:  +S1, +S2, Regular rate and rhythm, No edema. Abdomen:  Soft, nontender and nondistended. Normal bowel sounds, without guarding, and without rebound.   Neurologic:  Alert and  oriented x4;  grossly normal neurologically.  Impression/Plan: Kimberly Patton is here for an colonoscopy to be performed for surveillance due to prior history of colon polyps   Risks, benefits, limitations, and alternatives regarding  colonoscopy have been reviewed with the patient.  Questions have been answered.  All parties agreeable.   Ruel Kung, MD  04/16/2024, 8:37 AM

## 2024-04-16 NOTE — Op Note (Signed)
 [facility] Gastroenter[facility] Patient Name: Kimberly Patton Procedure Date: 04/16/2024 8:57 AM MRN: [id number] Account #: [id number] Date of Birth: [birth year] Admit Type: Outpatient Age: 68 Room: [facility] ENDO ROOM 2 Gender: Female Note Status: Finalized Instrument Name: Colon Scope [id number] Procedure:             Colonoscopy Indications:           Surveillance: Personal history of adenomatous polyps                         on last colonoscopy > 3 years ago, Last colonoscopy:                         November 2022 Providers:             Ruel Kung MD, MD Medicines:             Monitored Anesthesia Care Complications:         No immediate complications. Procedure:             Pre-Anesthesia Assessment:                        - Prior to the procedure, a History and Physical was                         performed, and patient medications, allergies and                         sensitivities were reviewed. The patient's tolerance                         of previous anesthesia was reviewed.                        - The risks and benefits of the procedure and the                         sedation options and risks were discussed with the                         patient. All questions were answered and informed                         consent was obtained.                        - ASA Grade Assessment: II - A patient with mild                         systemic disease.                        After obtaining informed consent, the colonoscope was                         passed under direct vision. Throughout the procedure,                         the patient's blood pressure, pulse, and oxygen  saturations were monitored continuously. The                         Colonoscope was introduced through the anus and                         advanced to the the cecum, identified by the                         appendiceal orifice. The colonoscopy was technically                          difficult and complex due to significant looping. The                         patient tolerated the procedure well. The quality of                         the bowel preparation was adequate. The ileocecal                         valve, appendiceal orifice, and rectum were                         photographed. Findings:      The perianal and digital rectal examinations were normal.      The exam was otherwise without abnormality on direct and retroflexion       views. Impression:            - The examination was otherwise normal on direct and                         retroflexion views.                        - No specimens collected. Recommendation:        - Discharge patient to home (with escort).                        - Resume previous diet.                        - Continue present medications.                        - Repeat colonoscopy is not recommended due to current                         age (4 years or older) for surveillance. Procedure Code(s):     --- Professional ---                        [phone], Colonoscopy, flexible; diagnostic, including                         collection of specimen(s) by brushing or washing, when                         performed (separate procedure) Diagnosis Code(s):     --- Professional ---  Z86.010, Personal history of colonic polyps CPT copyright 2022 American Medical Association. All rights reserved. The codes documented in this report are preliminary and upon coder review may  be revised to meet current compliance requirements. Ruel Kung, MD Ruel Kung MD, MD 04/16/2024 9:27:32 AM This report has been signed electronically. Number of Addenda: 0 Note Initiated On: 04/16/2024 8:57 AM Scope Withdrawal Time: 0 hours 9 minutes 27 seconds  Total Procedure Duration: 0 hours 18 minutes 29 seconds  Estimated Blood Loss:  Estimated blood loss: none.      [facility]

## 2024-04-16 NOTE — Transfer of Care (Signed)
 Immediate Anesthesia Transfer of Care Note  Patient: Kimberly Patton  Procedure(s) Performed: COLONOSCOPY  Patient Location: PACU  Anesthesia Type:General  Level of Consciousness: awake, alert , oriented, and patient cooperative  Airway & Oxygen Therapy: Patient Spontanous Breathing and Patient connected to face mask oxygen  Post-op Assessment: Report given to RN, Post -op Vital signs reviewed and stable, and Patient moving all extremities X 4  Post vital signs: Reviewed and stable  Last Vitals:  Vitals Value Taken Time  BP 92/61 04/16/24 09:31  Temp 35.7 C 04/16/24 09:30  Pulse 54 04/16/24 09:31  Resp 13 04/16/24 09:31  SpO2 100 % 04/16/24 09:31  Vitals shown include unfiled device data.  Last Pain:  Vitals:   04/16/24 0930  TempSrc: Temporal  PainSc: 0-No pain         Complications: No notable events documented.

## 2024-04-19 DIAGNOSIS — J0100 Acute maxillary sinusitis, unspecified: Secondary | ICD-10-CM | POA: Insufficient documentation

## 2024-04-19 NOTE — Assessment & Plan Note (Signed)
 Acute, likely initial viral infection, now with concern for bacterial superinfection. Recommend nasal steroid spray 2 sprays per nostril daily, nasal saline irrigation.  Will treat with amoxicillin  2 tablets twice daily x 10 days. Additional symptomatic and supportive care reviewed.  Return and ER precautions provided.

## 2024-04-20 ENCOUNTER — Encounter: Payer: Self-pay | Admitting: Cardiology

## 2024-04-20 ENCOUNTER — Ambulatory Visit: Attending: Cardiology | Admitting: Cardiology

## 2024-04-20 ENCOUNTER — Other Ambulatory Visit (INDEPENDENT_AMBULATORY_CARE_PROVIDER_SITE_OTHER): Payer: Self-pay | Admitting: Nurse Practitioner

## 2024-04-20 VITALS — BP 128/76 | HR 65 | Ht 66.0 in | Wt 270.0 lb

## 2024-04-20 DIAGNOSIS — E782 Mixed hyperlipidemia: Secondary | ICD-10-CM | POA: Diagnosis not present

## 2024-04-20 DIAGNOSIS — M7989 Other specified soft tissue disorders: Secondary | ICD-10-CM

## 2024-04-20 DIAGNOSIS — I471 Supraventricular tachycardia, unspecified: Secondary | ICD-10-CM | POA: Diagnosis not present

## 2024-04-20 DIAGNOSIS — I480 Paroxysmal atrial fibrillation: Secondary | ICD-10-CM | POA: Diagnosis not present

## 2024-04-20 DIAGNOSIS — I10 Essential (primary) hypertension: Secondary | ICD-10-CM

## 2024-04-20 NOTE — Patient Instructions (Signed)
 Medication Instructions:  Your physician recommends that you continue on your current medications as directed. Please refer to the Current Medication list given to you today.   *If you need a refill on your cardiac medications before your next appointment, please call your pharmacy*  Lab Work: No labs ordered today  If you have labs (blood work) drawn today and your tests are completely normal, you will receive your results only by: MyChart Message (if you have MyChart) OR A paper copy in the mail If you have any lab test that is abnormal or we need to change your treatment, we will call you to review the results.  Testing/Procedures: No test ordered today   Follow-Up: At [facility], you and your health needs are our priority.  As part of our continuing mission to provide you with exceptional heart care, our providers are all part of one team.  This team includes your primary Cardiologist (physician) and Advanced Practice Providers or APPs (Physician Assistants and Nurse Practitioners) who all work together to provide you with the care you need, when you need it.  Your next appointment:   6-12 month(s)  Provider:   You may see Redell Cave, MD or one of the following Advanced Practice Providers on your designated Care Team:   Lonni Meager, NP Lesley Maffucci, PA-C Bernardino Bring, PA-C Cadence Princeton, PA-C Tylene Lunch, NP Barnie Hila, NP    We recommend signing up for the patient portal called MyChart.  Sign up information is provided on this After Visit Summary.  MyChart is used to connect with patients for Virtual Visits (Telemedicine).  Patients are able to view lab/test results, encounter notes, upcoming appointments, etc.  Non-urgent messages can be sent to your provider as well.   To learn more about what you can do with MyChart, go to [email or web address].

## 2024-04-20 NOTE — Progress Notes (Signed)
 Cardiology Office Note:    Date:  04/20/2024   ID:  Kimberly Patton, DOB [birth year], MRN [id number]  PCP:  Avelina Greig BRAVO, MD  [facility] Cardiologist:  Redell Cave, MD  [facility] Electrophysiologist:  OLE ONEIDA HOLTS, MD   Referring MD: Avelina Greig BRAVO, MD   Chief Complaint  Patient presents with   Follow-up    2 month follow up visit. Patient is doing well on today. Meds reviewed    History of Present Illness:    Kimberly Patton is a 68 y.o. female with a hx of paroxysmal atrial fibrillation, hypertension, hiatal hernia, GERD, iron  deficiency anemia, SVT  presenting for follow-up.    Doing okay, denies chest pain or shortness of breath.  Previously complained of low heart rates usually in the evenings.  Cardiac monitor showed average heart rate 69, range 44-171 bpm.  No A-fib or flutter noted.  Compliant with medications as prescribed.  Working on [employer] loss, insurance company did not approve GLP-1 agents.  Denies issues, dizziness or syncope.   Prior notes Cardiac monitor 04/2022 no A-fib, occasional paroxysmal SVT Echocardiogram 01/2020 showed normal systolic and diastolic function, EF 60 to 65%.  Lexiscan  Myoview  01/2020 with no evidence for ischemia.  History of lone atrial fibrillation in the context of steroid administration. Has a history of hypokalemia while taking HCTZ. Losartan  was started after stopping HCTZ. Developed nausea with losartan . This was stopped.   Past Medical History:  Diagnosis Date   ALLERGIC RHINITIS 04/03/2007   Allergy    Not sure   Anemia    ((Pt Qnr Sub: Denies at visit from 08/28/2021 with endocrinology))    Anginal pain    Anxiety    Arthritis Not sure   Asthma    hx of years ago - no longer a problem    ASTHMA, PERSISTENT, MODERATE 04/03/2007   Back pain    Chest pain    Depression screen 01/08/2023   Dyspnea    with exertion   Dyspnea on exertion    a. 11/2007 Echo: EF 60%.   Dysrhythmia    hx of heart  arrhythmia 10-15 years ago - followed by DR Birmingham - not seen in years    Empty sella syndrome    GERD (gastroesophageal reflux disease)    Headache    Hemophilia carrier    Hiatal hernia    History of kidney stones    Hypercholesteremia 06/09/2019   Hypertension    Joint pain    HIPS / LEGS   Joint pain    Knee injury    RT   Lymph edema    bilateral legs   Meningioma (HCC)    Morbid obesity (HCC)    Neuromuscular disorder (HCC) Not sure, since my fall   Occipital neuralgia    Osteoarthritis    Palpitations    Paroxysmal SVT (supraventricular tachycardia)    a. 11/2011 48h Holter: RSR, rare PVC's, occas PAC's   Retinal tear of right eye 01/2016   Skin cancer    SOB (shortness of breath)    Thyroid  nodule 04/15/2018   Ventral hernia     Past Surgical History:  Procedure Laterality Date   ABDOMINAL HYSTERECTOMY  Think 1994   APPENDECTOMY  1996   ARTERY BIOPSY Left 03/21/2022   Procedure: BIOPSY TEMPORAL ARTERY;  Surgeon: Jama Cordella MATSU, MD;  Location: [facility];  Service: Vascular;  Laterality: Left;   BIOPSY THYROID      08/2021   BREAST CYST  EXCISION Right 1994   Benign   CARDIOVERSION N/A 11/12/2022   Procedure: CARDIOVERSION;  Surgeon: Perla Evalene PARAS, MD;  Location: [facility];  Service: Cardiovascular;  Laterality: N/A;   CESAREAN SECTION     x2   CHOLECYSTECTOMY  1995   COLONOSCOPY N/A 04/16/2024   Procedure: COLONOSCOPY;  Surgeon: Therisa Bi, MD;  Location: [facility] END[facility]COPY;  Service: Gastroenterology;  Laterality: N/A;  DM/Eliquis  Early morning appointment   COLONOSCOPY WITH PROPOFOL  N/A 05/09/2021   Procedure: COLONOSCOPY WITH PROPOFOL ;  Surgeon: Therisa Bi, MD;  Location: [facility] ENDOSCOPY;  Service: Gastroenterology;  Laterality: N/A;   ESOPHAGOGASTRODUODENOSCOPY (EGD) WITH PROPOFOL  N/A 11/02/2022   Procedure: ESOPHAGOGASTRODUODENOSCOPY (EGD) WITH PROPOFOL ;  Surgeon: Maryruth Ole DASEN, MD;  Location: [facility] ENDOSCOPY;  Service: Endoscopy;  Laterality: N/A;    EYE SURGERY  01/2016   Repair retinal tear   FOOT SURGERY  1995 / 1996   x2   HERNIA REPAIR     KNEE ARTHROSCOPY W/ MENISCAL REPAIR  07/25/2013   right knee Dr. Josefina   LAPAROSCOPIC GASTRIC SLEEVE RESECTION N/A 06/08/2013   Procedure: LAPAROSCOPIC GASTRIC SLEEVE RESECTION AND EXCISION OF SEBACEUS CYST FROM MID CHEST takedown of incarcerated ventral hernia and primary repair endoscopy;  Surgeon: Donnice KATHEE Lunger, MD;  Location: [facility];  Service: General;  Laterality: N/A;   LAPAROSCOPIC NISSEN FUNDOPLICATION N/A 02/02/2020   Procedure: LAPAROSCOPIC REPAIR LARGE SYMPTOMATIC HIATAL HERNIA WITH UPPER ENDOSCOPY;  Surgeon: Lunger Donnice, MD;  Location: [facility];  Service: General;  Laterality: N/A;   moles  06/2013   removed 2 moles from under arm and  lowback   NECK SURGERY     occipital nerve damage- injections    OVARIAN CYST REMOVAL  1970   PILONIDAL CYST EXCISION  1975   RADIOACTIVE SEED GUIDED EXCISIONAL BREAST BIOPSY Right 06/23/2021   Procedure: RADIOACTIVE SEED GUIDED EXCISIONAL RIGHT BREAST BIOPSY;  Surgeon: Lunger Donnice, MD;  Location: [facility];  Service: General;  Laterality: Right;   RETINAL TEAR REPAIR CRYOTHERAPY Right 01/2016   [place]   SKIN CANCER EXCISION     TEE WITHOUT CARDIOVERSION N/A 11/12/2022   Procedure: TRANSESOPHAGEAL ECHOCARDIOGRAM;  Surgeon: Perla Evalene PARAS, MD;  Location: [facility];  Service: Cardiovascular;  Laterality: N/A;   THYROID  LOBECTOMY Left 08/21/2022   Procedure: LEFT THYROID  LOBECTOMY;  Surgeon: Eletha Boas, MD;  Location: [facility];  Service: General;  Laterality: Left;   THYROIDECTOMY, PARTIAL     TONSILLECTOMY     TUBAL LIGATION  Think 1994   UPPER GI ENDOSCOPY  06/08/2013   Procedure: UPPER GI ENDOSCOPY;  Surgeon: Donnice KATHEE Lunger, MD;  Location: [facility];  Service: General;;   US  ECHOCARDIOGRAPHY  12/2011   WNL - EF 55-60%, mild MR, grade 1 diastolic dysnfiction (mild)   VENTRAL HERNIA REPAIR  2000    Current  Medications: Current Meds  Medication Sig   aspirin  EC 81 MG tablet Take 81 mg by mouth daily. Swallow whole.   Calcium  Citrate-Vitamin D  (CITRACAL + D PO) Take 2 tablets by mouth daily.    Cholecalciferol (VITAMIN D3) 1.25 MG (50000 UT) CAPS TAKE 1 CAPSULE BY MOUTH ONE TIME PER WEEK   Coenzyme Q10-Vitamin E  (QUNOL ULTRA COQ10) 100-150 MG-UNIT CAPS Take 100 mg by mouth daily.   Cyanocobalamin  (VITAMIN B-12) 2500 MCG SUBL Place 2,500 mcg under the tongue daily.   diltiazem  (CARDIZEM ) 30 MG tablet Take 30 mg by mouth 2 (two) times daily as needed.   esomeprazole (NEXIUM) 20 MG capsule  Take 20 mg by mouth daily at 12 noon.   Lactobacillus Rhamnosus, GG, (CULTURELLE IMMUNITY SUPPORT PO) Take 1 capsule by mouth daily.   levalbuterol  (XOPENEX  HFA) 45 MCG/ACT inhaler Inhale 2 puffs into the lungs every 6 (six) hours as needed for wheezing.   Menaquinone-7 (VITAMIN K2  PO) Take 100 mg by mouth daily.   Multiple Vitamins-Minerals (CENTRUM SILVER 50+WOMEN PO) Take 1 tablet by mouth daily.   Riboflavin  400 MG CAPS Take 400 mg by mouth daily.   rosuvastatin  (CRESTOR ) 10 MG tablet Take 1 tablet (10 mg total) by mouth daily.   spironolactone  (ALDACTONE ) 25 MG tablet Take 1 tablet (25 mg total) by mouth daily.   TURMERIC PO Take 2 tablets by mouth daily. 500 mg  With Ginger 50 mg Gummies   verapamil  (CALAN -SR) 120 MG CR tablet TAKE 1 TABLET BY MOUTH 2 TIMES DAILY.     Allergies:   Wasp venom, Chlorhexidine  gluconate, Metronidazole, Prednisone , Other, and Sodium hypochlorite   Social History   Socioeconomic History   Marital status: Divorced    Spouse name: Not on file   Number of children: 2   Years of education: 17   Highest education level: Bachelor's degree (e.g., BA, AB, BS)  Occupational History   Occupation: Magazine Features Editor: [employer]   Occupation: Retired Runner, Broadcasting/film/video  Tobacco Use   Smoking status: Never   Smokeless tobacco: Never   Tobacco comments:    smoke at age [age]   Vaping Use   Vaping status: Never Used  Substance and Sexual Activity   Alcohol use: Not Currently   Drug use: No   Sexual activity: Not Currently    Partners: Male    Birth control/protection: Abstinence, Pill  Other Topics Concern   Not on file  Social History Narrative   Lives at home alone.   Right-handed.   Occasional caffeine use.   Social Drivers of Corporate Investment Banker Strain: Low Risk  (03/21/2024)   Overall Financial Resource Strain (CARDIA)    Difficulty of Paying Living Expenses: Not hard at all  Food Insecurity: No Food Insecurity (03/21/2024)   Hunger Vital Sign    Worried About Running Out of Food in the Last Year: Never true    Ran Out of Food in the Last Year: Never true  Transportation Needs: No Transportation Needs (03/21/2024)   PRAPARE - Administrator, Civil Service (Medical): No    Lack of Transportation (Non-Medical): No  Physical Activity: Inactive (03/21/2024)   Exercise Vital Sign    Days of Exercise per Week: 0 days    Minutes of Exercise per Session: Not on file  Stress: No Stress Concern Present (03/21/2024)   [employer] of Occupational Health - Occupational Stress Questionnaire    Feeling of Stress: Not at all  Social Connections: Moderately Integrated (03/21/2024)   Social Connection and Isolation Panel    Frequency of Communication with Friends and Family: More than three times a week    Frequency of Social Gatherings with Friends and Family: Three times a week    Attends Religious Services: More than 4 times per year    Active Member of Clubs or Organizations: Yes    Attends Engineer, Structural: More than 4 times per year    Marital Status: Divorced     Family History: The patient's family history includes Breast cancer in her mother; Diabetes in her father, mother, and another family member; Heart disease in her  father and mother; Hemophilia in an other family member; Hypertension in her father,  mother, and another family member; Kidney failure in her mother; Obesity in her mother; Stroke in an other family member.  ROS:   Please see the history of present illness.     All other systems reviewed and are negative.  EKGs/Labs/Other Studies Reviewed:    The following studies were reviewed today:  EKG Interpretation Date/Time:  Monday April 20 2024 08:49:39 EST Ventricular Rate:  65 PR Interval:  190 QRS Duration:  94 QT Interval:  396 QTC Calculation: 411 R Axis:   -27  Text Interpretation: Normal sinus rhythm with sinus arrhythmia Cannot rule out Anterior infarct , age undetermined Confirmed by Darliss Rogue ([id number]) on 04/20/2024 8:58:46 AM    Recent Labs: 01/29/2024: Hemoglobin 13.6; Platelet Count 263 03/24/2024: ALT 14; BUN 17; Creatinine, Ser 0.53; Magnesium  2.0; Potassium 4.3; Sodium 140; TSH 1.44  Recent Lipid Panel    Component Value Date/Time   CHOL 161 05/15/2023 0721   CHOL 168 07/31/2021 0905   TRIG 124.0 05/15/2023 0721   HDL 56.20 05/15/2023 0721   HDL 67 07/31/2021 0905   CHOLHDL 3 05/15/2023 0721   VLDL 24.8 05/15/2023 0721   LDLCALC 80 05/15/2023 0721   LDLCALC 85 07/31/2021 0905    Physical Exam:    VS:  BP 128/76   Pulse 65   Ht 5' 6 (1.676 m)   Wt 270 lb (122.5 kg)   SpO2 99%   BMI 43.58 kg/m     Wt Readings from Last 3 Encounters:  04/20/24 270 lb (122.5 kg)  04/16/24 267 lb 6.4 oz (121.3 kg)  03/24/24 271 lb 2 oz (123 kg)     GEN:  Well nourished, well developed in no acute distress, obese HEENT: Normal NECK: No JVD; No carotid bruits CARDIAC: RRR, no murmurs, rubs, gallops RESPIRATORY:  Clear to auscultation without rales, wheezing or rhonchi  ABDOMEN: Soft, non-tender, non-distended MUSCULOSKELETAL: Lower extremity edema/ lymphedema noted SKIN: Warm and dry NEUROLOGIC:  Alert and oriented x 3 PSYCHIATRIC:  Normal affect   ASSESSMENT:    1. SVT (supraventricular tachycardia)   2. Primary hypertension   3.  Hyperlipidemia, mixed   4. Paroxysmal atrial fibrillation (HCC)    PLAN:    In order of problems listed above:   Paroxysmal SVT, denies significant palpitations.  Continue verapamil  120 mg CR twice daily. hypertension.  BP controlled.  Continue Cardizem  SR 120 mg twice daily, Aldactone  25 mg daily. Hyperlipidemia, continue Crestor  10 mg daily.  Paroxysmal atrial fibrillation in the context of steroid administration.  No further episodes of A-fib on cardiac monitor.  Follow-up in 6-12 months   Medication Adjustments/Labs and Tests Ordered: Current medicines are reviewed at length with the patient today.  Concerns regarding medicines are outlined above.  Orders Placed This Encounter  Procedures   EKG 12-Lead    No orders of the defined types were placed in this encounter.    Patient Instructions  Medication Instructions:  Your physician recommends that you continue on your current medications as directed. Please refer to the Current Medication list given to you today.   *If you need a refill on your cardiac medications before your next appointment, please call your pharmacy*  Lab Work: No labs ordered today  If you have labs (blood work) drawn today and your tests are completely normal, you will receive your results only by: MyChart Message (if you have MyChart) OR A paper copy in the  mail If you have any lab test that is abnormal or we need to change your treatment, we will call you to review the results.  Testing/Procedures: No test ordered today   Follow-Up: At [facility], you and your health needs are our priority.  As part of our continuing mission to provide you with exceptional heart care, our providers are all part of one team.  This team includes your primary Cardiologist (physician) and Advanced Practice Providers or APPs (Physician Assistants and Nurse Practitioners) who all work together to provide you with the care you need, when you need it.  Your  next appointment:   6-12 month(s)  Provider:   You may see Redell Cave, MD or one of the following Advanced Practice Providers on your designated Care Team:   Lonni Meager, NP Lesley Maffucci, PA-C Bernardino Bring, PA-C Cadence Antelope, PA-C Tylene Lunch, NP Barnie Hila, NP    We recommend signing up for the patient portal called MyChart.  Sign up information is provided on this After Visit Summary.  MyChart is used to connect with patients for Virtual Visits (Telemedicine).  Patients are able to view lab/test results, encounter notes, upcoming appointments, etc.  Non-urgent messages can be sent to your provider as well.   To learn more about what you can do with MyChart, go to [email or web address].        Signed, Redell Cave, MD  04/20/2024 11:02 AM    [facility]

## 2024-04-22 ENCOUNTER — Ambulatory Visit (INDEPENDENT_AMBULATORY_CARE_PROVIDER_SITE_OTHER): Admitting: Nurse Practitioner

## 2024-04-22 ENCOUNTER — Encounter (INDEPENDENT_AMBULATORY_CARE_PROVIDER_SITE_OTHER): Payer: Self-pay | Admitting: Nurse Practitioner

## 2024-04-22 ENCOUNTER — Ambulatory Visit (INDEPENDENT_AMBULATORY_CARE_PROVIDER_SITE_OTHER)

## 2024-04-22 VITALS — BP 130/81 | HR 63 | Resp 18 | Ht 66.0 in | Wt 269.0 lb

## 2024-04-22 DIAGNOSIS — I10 Essential (primary) hypertension: Secondary | ICD-10-CM

## 2024-04-22 DIAGNOSIS — I890 Lymphedema, not elsewhere classified: Secondary | ICD-10-CM

## 2024-04-22 DIAGNOSIS — M7989 Other specified soft tissue disorders: Secondary | ICD-10-CM

## 2024-04-22 DIAGNOSIS — I8310 Varicose veins of unspecified lower extremity with inflammation: Secondary | ICD-10-CM | POA: Diagnosis not present

## 2024-04-22 NOTE — Progress Notes (Signed)
 Subjective:    Patient ID: Kimberly Patton, female    DOB: February 18, 1956, 68 y.o.   MRN: [id number] Chief Complaint  Patient presents with   Follow-up    Follow up reflux     The patient returns for followup evaluation 3 months after the initial visit. The patient continues to have pain in the lower extremities with dependency. The pain is lessened with elevation. Graduated compression stockings, Class I (20-30 mmHg), have been worn but the stockings do not eliminate the leg pain. Over-the-counter analgesics do not improve the symptoms. The degree of discomfort continues to interfere with daily activities. The patient notes the pain in the legs is causing problems with daily exercise, at the workplace and even with household activities and maintenance such as standing in the kitchen preparing meals and doing dishes.   Venous ultrasound shows normal deep venous system, no evidence of acute or chronic DVT.  Superficial reflux is present in the left GSV    Review of Systems  Cardiovascular:  Positive for leg swelling.  All other systems reviewed and are negative.      Objective:   Physical Exam Vitals reviewed.  HENT:     Head: Normocephalic.  Cardiovascular:     Rate and Rhythm: Normal rate.     Pulses: Normal pulses.  Pulmonary:     Effort: Pulmonary effort is normal.  Skin:    General: Skin is warm and dry.  Neurological:     Mental Status: Kimberly Patton is alert and oriented to person, place, and time.  Psychiatric:        Mood and Affect: Mood normal.        Behavior: Behavior normal.        Thought Content: Thought content normal.        Judgment: Judgment normal.     BP 130/81 (BP Location: Left Arm)   Pulse 63   Resp 18   Ht 5' 6 (1.676 m)   Wt 269 lb (122 kg)   BMI 43.42 kg/m   Past Medical History:  Diagnosis Date   ALLERGIC RHINITIS 04/03/2007   Allergy    Not sure   Anemia    ((Pt Qnr Sub: Denies at visit from 08/28/2021 with endocrinology))    Anginal pain     Anxiety    Arthritis Not sure   Asthma    hx of years ago - no longer a problem    ASTHMA, PERSISTENT, MODERATE 04/03/2007   Back pain    Chest pain    Depression screen 01/08/2023   Dyspnea    with exertion   Dyspnea on exertion    a. 11/2007 Echo: EF 60%.   Dysrhythmia    hx of heart arrhythmia 10-15 years ago - followed by DR Birmingham - not seen in years    Empty sella syndrome    GERD (gastroesophageal reflux disease)    Headache    Hemophilia carrier    Hiatal hernia    History of kidney stones    Hypercholesteremia 06/09/2019   Hypertension    Joint pain    HIPS / LEGS   Joint pain    Knee injury    RT   Lymph edema    bilateral legs   Meningioma (HCC)    Morbid obesity (HCC)    Neuromuscular disorder (HCC) Not sure, since my fall   Occipital neuralgia    Osteoarthritis    Palpitations    Paroxysmal SVT (supraventricular tachycardia)  a. 11/2011 48h Holter: RSR, rare PVC's, occas PAC's   Retinal tear of right eye 01/2016   Skin cancer    SOB (shortness of breath)    Thyroid  nodule 04/15/2018   Ventral hernia     Social History   Socioeconomic History   Marital status: Divorced    Spouse name: Not on file   Number of children: 2   Years of education: 17   Highest education level: Bachelor's degree (e.g., BA, AB, BS)  Occupational History   Occupation: Magazine Features Editor: [employer]   Occupation: Retired Runner, Broadcasting/film/video  Tobacco Use   Smoking status: Never   Smokeless tobacco: Never   Tobacco comments:    smoke at age [age]  Vaping Use   Vaping status: Never Used  Substance and Sexual Activity   Alcohol use: Not Currently   Drug use: No   Sexual activity: Not Currently    Partners: Male    Birth control/protection: Abstinence, Pill  Other Topics Concern   Not on file  Social History Narrative   Lives at home alone.   Right-handed.   Occasional caffeine use.   Social Drivers of Corporate Investment Banker Strain: Low Risk   (03/21/2024)   Overall Financial Resource Strain (CARDIA)    Difficulty of Paying Living Expenses: Not hard at all  Food Insecurity: No Food Insecurity (03/21/2024)   Hunger Vital Sign    Worried About Running Out of Food in the Last Year: Never true    Ran Out of Food in the Last Year: Never true  Transportation Needs: No Transportation Needs (03/21/2024)   PRAPARE - Administrator, Civil Service (Medical): No    Lack of Transportation (Non-Medical): No  Physical Activity: Inactive (03/21/2024)   Exercise Vital Sign    Days of Exercise per Week: 0 days    Minutes of Exercise per Session: Not on file  Stress: No Stress Concern Present (03/21/2024)   [employer] of Occupational Health - Occupational Stress Questionnaire    Feeling of Stress: Not at all  Social Connections: Moderately Integrated (03/21/2024)   Social Connection and Isolation Panel    Frequency of Communication with Friends and Family: More than three times a week    Frequency of Social Gatherings with Friends and Family: Three times a week    Attends Religious Services: More than 4 times per year    Active Member of Clubs or Organizations: Yes    Attends Banker Meetings: More than 4 times per year    Marital Status: Divorced  Intimate Partner Violence: Not At Risk (05/31/2023)   Humiliation, Afraid, Rape, and Kick questionnaire    Fear of Current or Ex-Partner: No    Emotionally Abused: No    Physically Abused: No    Sexually Abused: No    Past Surgical History:  Procedure Laterality Date   ABDOMINAL HYSTERECTOMY  Think 1994   APPENDECTOMY  1996   ARTERY BIOPSY Left 03/21/2022   Procedure: BIOPSY TEMPORAL ARTERY;  Surgeon: Jama Cordella MATSU, MD;  Location: [facility];  Service: Vascular;  Laterality: Left;   BIOPSY THYROID      08/2021   BREAST CYST EXCISION Right 1994   Benign   CARDIOVERSION N/A 11/12/2022   Procedure: CARDIOVERSION;  Surgeon: Perla Evalene PARAS, MD;  Location:  [facility];  Service: Cardiovascular;  Laterality: N/A;   CESAREAN SECTION     x2   CHOLECYSTECTOMY  1995   COLONOSCOPY N/A  04/16/2024   Procedure: COLONOSCOPY;  Surgeon: Therisa Bi, MD;  Location: [facility] ENDOSCOPY;  Service: Gastroenterology;  Laterality: N/A;  DM/Eliquis  Early morning appointment   COLONOSCOPY WITH PROPOFOL  N/A 05/09/2021   Procedure: COLONOSCOPY WITH PROPOFOL ;  Surgeon: Therisa Bi, MD;  Location: [facility] ENDOSCOPY;  Service: Gastroenterology;  Laterality: N/A;   ESOPHAGOGASTRODUODENOSCOPY (EGD) WITH PROPOFOL  N/A 11/02/2022   Procedure: ESOPHAGOGASTRODUODENOSCOPY (EGD) WITH PROPOFOL ;  Surgeon: Maryruth Ole DASEN, MD;  Location: [facility] ENDOSCOPY;  Service: Endoscopy;  Laterality: N/A;   EYE SURGERY  01/2016   Repair retinal tear   FOOT SURGERY  1995 / 1996   x2   HERNIA REPAIR     KNEE ARTHROSCOPY W/ MENISCAL REPAIR  07/25/2013   right knee Dr. Josefina   LAPAROSCOPIC GASTRIC SLEEVE RESECTION N/A 06/08/2013   Procedure: LAPAROSCOPIC GASTRIC SLEEVE RESECTION AND EXCISION OF SEBACEUS CYST FROM MID CHEST takedown of incarcerated ventral hernia and primary repair endoscopy;  Surgeon: Donnice KATHEE Lunger, MD;  Location: [facility];  Service: General;  Laterality: N/A;   LAPAROSCOPIC NISSEN FUNDOPLICATION N/A 02/02/2020   Procedure: LAPAROSCOPIC REPAIR LARGE SYMPTOMATIC HIATAL HERNIA WITH UPPER ENDOSCOPY;  Surgeon: Lunger Donnice, MD;  Location: [facility];  Service: General;  Laterality: N/A;   moles  06/2013   removed 2 moles from under arm and  lowback   NECK SURGERY     occipital nerve damage- injections    OVARIAN CYST REMOVAL  1970   PILONIDAL CYST EXCISION  1975   RADIOACTIVE SEED GUIDED EXCISIONAL BREAST BIOPSY Right 06/23/2021   Procedure: RADIOACTIVE SEED GUIDED EXCISIONAL RIGHT BREAST BIOPSY;  Surgeon: Lunger Donnice, MD;  Location: [facility];  Service: General;  Laterality: Right;   RETINAL TEAR REPAIR CRYOTHERAPY Right 01/2016   [place]   SKIN CANCER EXCISION      TEE WITHOUT CARDIOVERSION N/A 11/12/2022   Procedure: TRANSESOPHAGEAL ECHOCARDIOGRAM;  Surgeon: Perla Evalene PARAS, MD;  Location: [facility];  Service: Cardiovascular;  Laterality: N/A;   THYROID  LOBECTOMY Left 08/21/2022   Procedure: LEFT THYROID  LOBECTOMY;  Surgeon: Eletha Boas, MD;  Location: [facility];  Service: General;  Laterality: Left;   THYROIDECTOMY, PARTIAL     TONSILLECTOMY     TUBAL LIGATION  Think 1994   UPPER GI ENDOSCOPY  06/08/2013   Procedure: UPPER GI ENDOSCOPY;  Surgeon: Donnice KATHEE Lunger, MD;  Location: [facility];  Service: General;;   US  ECHOCARDIOGRAPHY  12/2011   WNL - EF 55-60%, mild MR, grade 1 diastolic dysnfiction (mild)   VENTRAL HERNIA REPAIR  2000    Family History  Problem Relation Age of Onset   Breast cancer Mother    Diabetes Mother    Hypertension Mother    Kidney failure Mother    Heart disease Mother    Obesity Mother    Diabetes Father    Hypertension Father    Heart disease Father    Diabetes Other    Hypertension Other    Stroke Other    Hemophilia Other     Allergies  Allergen Reactions   Wasp Venom Anaphylaxis   Chlorhexidine  Gluconate     Itching under breasts   Metronidazole Other (See Comments)    Chest tightness, neck tightness   Prednisone  Other (See Comments)    Severe migraine / with taper dose Told by Cardiologist to avoid due to Afib   Other Rash    Bleach   Sodium Hypochlorite Rash    Liquid clorox bleach       Latest Ref Rng &  Units 01/29/2024   11:01 AM 12/17/2023   10:05 AM 10/02/2023    9:06 AM  CBC  WBC 4.0 - 10.5 K/uL 6.5  6.5  6.1   Hemoglobin 12.0 - 15.0 g/dL 86.3  85.7  86.2   Hematocrit 36.0 - 46.0 % 40.9  42.6  41.0   Platelets 150 - 400 K/uL 263  284.0  251       CMP     Component Value Date/Time   NA 140 03/24/2024 1027   NA 143 08/26/2023 0819   K 4.3 03/24/2024 1027   CL 100 03/24/2024 1027   CO2 31 03/24/2024 1027   GLUCOSE 86 03/24/2024 1027   BUN 17 03/24/2024 1027   BUN 27 08/26/2023 0819    CREATININE 0.53 03/24/2024 1027   CREATININE 0.57 01/01/2020 1623   CALCIUM  9.9 03/24/2024 1027   PROT 6.9 03/24/2024 1027   ALBUMIN 4.5 03/24/2024 1027   AST 14 03/24/2024 1027   ALT 14 03/24/2024 1027   ALKPHOS 72 03/24/2024 1027   BILITOT 0.6 03/24/2024 1027   GFR 95.04 03/24/2024 1027   EGFR 97 08/26/2023 0819   GFRNONAA >60 11/12/2022 0322     No results found.     Assessment & Plan:   1. Lymphedema (Primary) I had a long discussion with the patient regarding lymphedema.  Unfortunately we do not have any medications or pills that are going to drastically improve her swelling.  At this time the patient is doing a fairly good job of conservative therapies per her report.  Kimberly Patton is advised to continue with use of medical grade compression, elevation and activity.  Kimberly Patton should also continue with use of her lymphedema pump.  There was also some concern that her swelling is more so related to lipedema.  I discussed that with lipedema typically the only surgical option would be something of liposuction which we are unable to assist with.  Otherwise conservative therapies include the same as with lymphedema in addition to a anti-inflammatory diet in addition to activity.  2. Varicose veins with inflammation Recommend  I have reviewed my previous  discussion with the patient regarding  varicose veins and why they cause symptoms. Patient will continue  wearing graduated compression stockings class 1 on a daily basis, beginning first thing in the morning and removing them in the evening.  The patient is CEAP [id number].  The patient has been wearing compression for more than 12 weeks with no or little benefit.  The patient has been exercising daily for more than 12 weeks. The patient has been elevating and taking OTC pain medications for more than 12 weeks.  None of these have have eliminated the pain related to the varicose veins and venous reflux or the discomfort regarding venous  congestion.    In addition, behavioral modification including elevation during the day was again discussed and this will continue.  The patient has utilized over the counter pain medications and has been exercising.  However, at this time conservative therapy has not alleviated the patient's symptoms of leg pain and swelling  Recommend: laser ablation of the left great saphenous veins to eliminate the symptoms of pain and swelling of the lower extremities caused by the severe superficial venous reflux disease.   3. Primary hypertension Continue antihypertensive medications as already ordered, these medications have been reviewed and there are no changes at this time.   Current Outpatient Medications on File Prior to Visit  Medication Sig Dispense Refill   amoxicillin  (  AMOXIL ) 500 MG capsule Take 2 capsules (1,000 mg total) by mouth 2 (two) times daily. 40 capsule 0   aspirin  EC 81 MG tablet Take 81 mg by mouth daily. Swallow whole.     Calcium  Citrate-Vitamin D  (CITRACAL + D PO) Take 2 tablets by mouth daily.      Cholecalciferol (VITAMIN D3) 1.25 MG (50000 UT) CAPS TAKE 1 CAPSULE BY MOUTH ONE TIME PER WEEK 12 capsule 0   Coenzyme Q10-Vitamin E  (QUNOL ULTRA COQ10) 100-150 MG-UNIT CAPS Take 100 mg by mouth daily.     Cyanocobalamin  (VITAMIN B-12) 2500 MCG SUBL Place 2,500 mcg under the tongue daily.     diltiazem  (CARDIZEM ) 30 MG tablet Take 30 mg by mouth 2 (two) times daily as needed.     esomeprazole (NEXIUM) 20 MG capsule Take 20 mg by mouth daily at 12 noon.     furosemide  (LASIX ) 20 MG tablet Take 1 tablet (20 mg total) by mouth daily as needed. 90 tablet 3   levalbuterol  (XOPENEX  HFA) 45 MCG/ACT inhaler Inhale 2 puffs into the lungs every 6 (six) hours as needed for wheezing. 1 each 2   Menaquinone-7 (VITAMIN K2  PO) Take 100 mg by mouth daily.     Multiple Vitamins-Minerals (CENTRUM SILVER 50+WOMEN PO) Take 1 tablet by mouth daily.     nitroGLYCERIN  (NITROSTAT ) 0.4 MG SL tablet  Place 1 tablet (0.4 mg total) under the tongue every 5 (five) minutes as needed for chest pain. 25 tablet 3   Riboflavin  400 MG CAPS Take 400 mg by mouth daily.     rosuvastatin  (CRESTOR ) 10 MG tablet Take 1 tablet (10 mg total) by mouth daily. 90 tablet 3   spironolactone  (ALDACTONE ) 25 MG tablet Take 1 tablet (25 mg total) by mouth daily. 90 tablet 3   TURMERIC PO Take 2 tablets by mouth daily. 500 mg  With Ginger 50 mg Gummies     verapamil  (CALAN -SR) 120 MG CR tablet TAKE 1 TABLET BY MOUTH 2 TIMES DAILY. 180 tablet 2   Lactobacillus Rhamnosus, GG, (CULTURELLE IMMUNITY SUPPORT PO) Take 1 capsule by mouth daily.     methocarbamol  (ROBAXIN ) 500 MG tablet Take 500 mg by mouth at bedtime as needed for muscle spasms. (Patient not taking: Reported on 04/20/2024)     traMADol  (ULTRAM ) 50 MG tablet Take 1 tablet (50 mg total) by mouth every 6 (six) hours as needed. (Patient not taking: Reported on 04/20/2024) 12 tablet 0   Current Facility-Administered Medications on File Prior to Visit  Medication Dose Route Frequency Provider Last Rate Last Admin   albuterol  (PROVENTIL ) (2.5 MG/3ML) 0.083% nebulizer solution 2.5 mg  2.5 mg Nebulization Once Tamea Dedra CROME, MD        There are no Patient Instructions on file for this visit. No follow-ups on file.   Pegi Milazzo E Kavish Lafitte, NP

## 2024-04-22 NOTE — Anesthesia Postprocedure Evaluation (Signed)
 Anesthesia Post Note  Patient: Kimberly Patton  Procedure(s) Performed: COLONOSCOPY  Patient location during evaluation: PACU Anesthesia Type: General Level of consciousness: awake and alert Pain management: pain level controlled Vital Signs Assessment: post-procedure vital signs reviewed and stable Respiratory status: spontaneous breathing, nonlabored ventilation, respiratory function stable and patient connected to nasal cannula oxygen Cardiovascular status: blood pressure returned to baseline and stable Postop Assessment: no apparent nausea or vomiting Anesthetic complications: no   No notable events documented.   Last Vitals:  Vitals:   04/16/24 0942 04/16/24 0951  BP: 99/66 111/62  Pulse: (!) 54 (!) 43  Resp: 13 15  Temp:    SpO2: 100%     Last Pain:  Vitals:   04/17/24 0733  TempSrc:   PainSc: 0-No pain                 Lynwood KANDICE Clause

## 2024-04-23 ENCOUNTER — Ambulatory Visit (INDEPENDENT_AMBULATORY_CARE_PROVIDER_SITE_OTHER): Admitting: Nurse Practitioner

## 2024-04-24 ENCOUNTER — Encounter (INDEPENDENT_AMBULATORY_CARE_PROVIDER_SITE_OTHER): Payer: Self-pay

## 2024-04-28 DIAGNOSIS — S9031XA Contusion of right foot, initial encounter: Secondary | ICD-10-CM | POA: Diagnosis not present

## 2024-04-28 DIAGNOSIS — M19072 Primary osteoarthritis, left ankle and foot: Secondary | ICD-10-CM | POA: Diagnosis not present

## 2024-04-28 DIAGNOSIS — M19071 Primary osteoarthritis, right ankle and foot: Secondary | ICD-10-CM | POA: Diagnosis not present

## 2024-05-01 ENCOUNTER — Emergency Department
Admission: EM | Admit: 2024-05-01 | Discharge: 2024-05-02 | Disposition: A | Discharge status: Home / Self Care | Attending: Emergency Medicine | Admitting: Emergency Medicine

## 2024-05-01 ENCOUNTER — Emergency Department

## 2024-05-01 ENCOUNTER — Other Ambulatory Visit: Payer: Self-pay

## 2024-05-01 DIAGNOSIS — N2889 Other specified disorders of kidney and ureter: Secondary | ICD-10-CM | POA: Insufficient documentation

## 2024-05-01 DIAGNOSIS — I10 Essential (primary) hypertension: Secondary | ICD-10-CM | POA: Insufficient documentation

## 2024-05-01 DIAGNOSIS — K449 Diaphragmatic hernia without obstruction or gangrene: Secondary | ICD-10-CM | POA: Diagnosis not present

## 2024-05-01 DIAGNOSIS — N281 Cyst of kidney, acquired: Secondary | ICD-10-CM | POA: Diagnosis not present

## 2024-05-01 DIAGNOSIS — Z85828 Personal history of other malignant neoplasm of skin: Secondary | ICD-10-CM | POA: Diagnosis not present

## 2024-05-01 DIAGNOSIS — R1032 Left lower quadrant pain: Secondary | ICD-10-CM | POA: Insufficient documentation

## 2024-05-01 DIAGNOSIS — J45909 Unspecified asthma, uncomplicated: Secondary | ICD-10-CM | POA: Insufficient documentation

## 2024-05-01 DIAGNOSIS — R002 Palpitations: Secondary | ICD-10-CM | POA: Diagnosis present

## 2024-05-01 DIAGNOSIS — N289 Disorder of kidney and ureter, unspecified: Secondary | ICD-10-CM | POA: Diagnosis not present

## 2024-05-01 DIAGNOSIS — K5790 Diverticulosis of intestine, part unspecified, without perforation or abscess without bleeding: Secondary | ICD-10-CM | POA: Diagnosis not present

## 2024-05-01 DIAGNOSIS — I480 Paroxysmal atrial fibrillation: Secondary | ICD-10-CM | POA: Diagnosis not present

## 2024-05-01 LAB — CBC WITH DIFFERENTIAL/PLATELET
Abs Immature Granulocytes: 0.03 K/uL (ref 0.00–0.07)
Basophils Absolute: 0.1 K/uL (ref 0.0–0.1)
Basophils Relative: 1 %
Eosinophils Absolute: 0.2 K/uL (ref 0.0–0.5)
Eosinophils Relative: 2 %
HCT: 41.9 % (ref 36.0–46.0)
Hemoglobin: 13.9 g/dL (ref 12.0–15.0)
Immature Granulocytes: 0 %
Lymphocytes Relative: 26 %
Lymphs Abs: 2.2 K/uL (ref 0.7–4.0)
MCH: 31.1 pg (ref 26.0–34.0)
MCHC: 33.2 g/dL (ref 30.0–36.0)
MCV: 93.7 fL (ref 80.0–100.0)
Monocytes Absolute: 0.6 K/uL (ref 0.1–1.0)
Monocytes Relative: 7 %
Neutro Abs: 5.2 K/uL (ref 1.7–7.7)
Neutrophils Relative %: 64 %
Platelets: 257 K/uL (ref 150–400)
RBC: 4.47 MIL/uL (ref 3.87–5.11)
RDW: 12.9 % (ref 11.5–15.5)
WBC: 8.2 K/uL (ref 4.0–10.5)
nRBC: 0 % (ref 0.0–0.2)

## 2024-05-01 LAB — URINALYSIS, W/ REFLEX TO CULTURE (INFECTION SUSPECTED)
Bacteria, UA: NONE SEEN
Bilirubin Urine: NEGATIVE
Glucose, UA: NEGATIVE mg/dL
Hgb urine dipstick: NEGATIVE
Ketones, ur: NEGATIVE mg/dL
Leukocytes,Ua: NEGATIVE
Nitrite: NEGATIVE
Protein, ur: NEGATIVE mg/dL
Specific Gravity, Urine: 1.016 (ref 1.005–1.030)
WBC, UA: 0 WBC/hpf (ref 0–5)
pH: 7 (ref 5.0–8.0)

## 2024-05-01 LAB — TROPONIN T, HIGH SENSITIVITY: Troponin T High Sensitivity: <15 ng/L (ref 0–19)

## 2024-05-01 LAB — BASIC METABOLIC PANEL WITH GFR
Anion gap: 10 (ref 5–15)
BUN: 27 mg/dL — ABNORMAL HIGH (ref 8–23)
CO2: 26 mmol/L (ref 22–32)
Calcium: 9.5 mg/dL (ref 8.9–10.3)
Chloride: 103 mmol/L (ref 98–111)
Creatinine, Ser: 0.51 mg/dL (ref 0.44–1.00)
GFR, Estimated: >60 mL/min (ref >60)
Glucose, Bld: 101 mg/dL — ABNORMAL HIGH (ref 70–99)
Potassium: 3.8 mmol/L (ref 3.5–5.1)
Sodium: 139 mmol/L (ref 135–145)

## 2024-05-01 MED ORDER — DILTIAZEM HCL 25 MG/5ML IV SOLN
20.0000 mg | Freq: Once | INTRAVENOUS | Status: AC
  Administered 2024-05-01: 20 mg via INTRAVENOUS
  Filled 2024-05-01: qty 5

## 2024-05-01 MED ORDER — SODIUM CHLORIDE 0.9 % IV BOLUS
1000.0000 mL | Freq: Once | INTRAVENOUS | Status: AC
  Administered 2024-05-01: 1000 mL via INTRAVENOUS

## 2024-05-01 MED ORDER — IOHEXOL 350 MG/ML SOLN
100.0000 mL | Freq: Once | INTRAVENOUS | Status: AC | PRN
  Administered 2024-05-01: 100 mL via INTRAVENOUS

## 2024-05-01 MED ORDER — LORAZEPAM 2 MG/ML IJ SOLN
1.0000 mg | Freq: Once | INTRAMUSCULAR | Status: AC
  Administered 2024-05-01: 1 mg via INTRAVENOUS
  Filled 2024-05-01: qty 1

## 2024-05-01 MED ORDER — DILTIAZEM HCL 60 MG PO TABS
30.0000 mg | ORAL_TABLET | Freq: Once | ORAL | Status: AC
  Administered 2024-05-01: 30 mg via ORAL
  Filled 2024-05-01: qty 1

## 2024-05-01 NOTE — ED Provider Notes (Signed)
-----------------------------------------   11:34 PM on 05/01/2024 -----------------------------------------  Blood pressure 120/89, pulse (!) 57, temperature 97.8 F (36.6 C), temperature source Oral, resp. rate 19, height 1.676 m (5' 6), weight 122 kg, SpO2 100%.   Assuming care from Dr. Viviann.  In short, Kimberly Patton is a 68 y.o. female with a chief complaint of a-fib and abdominal pain.  Refer to the original H&P for additional details.  The current plan of care is to follow up on UA, CT scan.  Reassess, likely will be appropriate for discharge.   Clinical Course as of 05/02/24 0021  Fri May 01, 2024  2351 Urinalysis, w/ Reflex to Culture (Infection Suspected) -Urine, Clean Catch(!) Urinalysis is clean with no evidence of infection [CF]  Sat May 02, 2024  0017 CT ABDOMEN PELVIS W CONTRAST I independently viewed and interpreted the patient's abd/pelvis CT, as well as reviewing the radiologist's report.  No acute abnormality is obvious such as SBO or ileus.  However the radiologist commented on a worrisome right renal mass that could represent a renal cell carcinoma and requires close follow-up. [CF]  0018 I reassessed the patient and her heart rate is still under control and she is not having any palpitations.  No chest pain, no indication to repeat a high-sensitivity troponin.  I went over the results of the CT scan with the patient and stressed the importance of close follow-up either with her PCP and/or with urology and I provided her follow-up information.  She understands that is important.  She will also call the office of her cardiologist to follow-up, but she just saw him recently and does not feel that a referral is necessary [CF]    Clinical Course User Index [CF] Gordan Huxley, MD     Medications  diltiazem  (CARDIZEM ) injection 20 mg (20 mg Intravenous Given 05/01/24 2243)  sodium chloride  0.9 % bolus 1,000 mL (1,000 mLs Intravenous New Bag/Given 05/01/24 2242)   LORazepam  (ATIVAN ) injection 1 mg (1 mg Intravenous Given 05/01/24 2357)  iohexol  (OMNIPAQUE ) 350 MG/ML injection 100 mL (100 mLs Intravenous Contrast Given 05/01/24 2318)  diltiazem  (CARDIZEM ) tablet 30 mg (30 mg Oral Given 05/01/24 2359)     ED Discharge Orders          Ordered    Ambulatory referral to Cardiology       Comments: If you have not heard from the Cardiology office within the next 72 hours please call [phone].   05/01/24 2319           Final diagnoses:  PAF (paroxysmal atrial fibrillation) (HCC)  Right renal mass     Gordan Huxley, MD 05/02/24 0021

## 2024-05-01 NOTE — Discharge Instructions (Addendum)
 As we discussed, your lab work was generally reassuring and we encourage you to take the medications you have been previously prescribed by your cardiologist.  You can follow-up with him and/or with Dr. Avelina at your earliest convenience.  It is also important you follow-up either with Dr. Avelina or with Dr. Twylla or one of his urology colleagues regarding the mass seen on your right kidney on the CT scan.  The CT scan was unable to identify exactly what the mass is, but it is important that you follow-up with the specialist at the next available opportunity.  They will help you with additional evaluation, which may include an outpatient MRI or other sorts of imaging.  The radiologist described in the CT scan report tonight what possible options may include, and your doctors can refer to this for additional information as well.    Return to the emergency department if you develop new or worsening symptoms that concern you.

## 2024-05-01 NOTE — ED Provider Notes (Signed)
 [facility] Provider Note    Event Date/Time   First MD Initiated Contact with Patient 05/01/24 2056     (approximate)   History   Chief Complaint: Palpitations   HPI  Kimberly Patton is a 68 y.o. female with a history of hypertension, paroxysmal SVT who comes to the ED due to rapid heart rate at home.  She was at rest on the couch, around 8:00 PM when she felt sudden onset of chest fluttering.  Her smart watch device told her that she might be in A-fib, so she came to the ED.  Denies chest pain shortness of breath or dizziness.  No syncope.  Has as needed diltiazem  tablets to take at home but did not try it.  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 11/2007 Echo: EF 60%.   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 11/2011 48h Holter: RSR, rare PVC's, occas PAC's   Retinal tear of right eye 01/2016   Skin cancer    SOB (shortness of breath)     Thyroid  nodule 04/15/2018   Ventral hernia     Current Outpatient Rx   Order #: [id number] Class: Normal   Order #: [id number] Class: Historical Med   Order #: [id number] Class: Historical Med   Order #: [id number] Class: Normal   Order #: [id number] Class: Historical Med   Order #: [id number] Class: Historical Med   Order #: [id number] Class: Historical Med   Order #: [id number] Class: Historical Med   Order #: [id number] Class: Normal   Order #: [id number] Class: Historical Med   Order #: [id number] Class: Normal   Order #: [id number] Class: Historical Med   Order #: [id number] Class: Historical Med   Order #: [id number] Class: Historical Med   Order #: [id number] Class: Normal   Order #: [id number] Class: Historical Med   Order #: [id number] Class: Normal   Order #: [id number] Class: Normal   Order #: [id number] Class: Normal   Order #: [id number] Class: Historical Med   Order #: [id number] Class: Normal    Past Surgical History:  Procedure Laterality Date   ABDOMINAL HYSTERECTOMY  Think 1994  APPENDECTOMY  1996   ARTERY BIOPSY Left 03/21/2022   Procedure: BIOPSY TEMPORAL ARTERY;  Surgeon: Jama Cordella MATSU, MD;  Location: [facility];  Service: Vascular;  Laterality: Left;   BIOPSY THYROID      08/2021   BREAST CYST EXCISION Right 1994   Benign   CARDIOVERSION N/A 11/12/2022   Procedure: CARDIOVERSION;  Surgeon: Perla Evalene PARAS, MD;  Location: [facility];  Service: Cardiovascular;  Laterality: N/A;   CESAREAN SECTION     x2   CHOLECYSTECTOMY  1995   COLONOSCOPY N/A 04/16/2024   Procedure: COLONOSCOPY;  Surgeon: Therisa Bi, MD;  Location: [facility]COPY;  Service: Gastroenterology;  Laterality: N/A;  DM/Eliquis  Early morning appointment   COLONOSCOPY WITH PROPOFOL  N/A 05/09/2021   Procedure: COLONOSCOPY WITH PROPOFOL ;  Surgeon: Therisa Bi, MD;  Location: [facility];  Service: Gastroenterology;  Laterality: N/A;   ESOPHAGOGASTRODUODENOSCOPY (EGD) WITH PROPOFOL  N/A 11/02/2022   Procedure:  ESOPHAGOGASTRODUODENOSCOPY (EGD) WITH PROPOFOL ;  Surgeon: Maryruth Ole DASEN, MD;  Location: [facility] ENDOSCOPY;  Service: Endoscopy;  Laterality: N/A;   EYE SURGERY  01/2016   Repair retinal tear   FOOT SURGERY  1995 / 1996   x2   HERNIA REPAIR     KNEE ARTHROSCOPY W/ MENISCAL REPAIR  07/25/2013   right knee Dr. Josefina   LAPAROSCOPIC GASTRIC SLEEVE RESECTION N/A 06/08/2013   Procedure: LAPAROSCOPIC GASTRIC SLEEVE RESECTION AND EXCISION OF SEBACEUS CYST FROM MID CHEST takedown of incarcerated ventral hernia and primary repair endoscopy;  Surgeon: Donnice KATHEE Lunger, MD;  Location: [facility];  Service: General;  Laterality: N/A;   LAPAROSCOPIC NISSEN FUNDOPLICATION N/A 02/02/2020   Procedure: LAPAROSCOPIC REPAIR LARGE SYMPTOMATIC HIATAL HERNIA WITH UPPER ENDOSCOPY;  Surgeon: Lunger Donnice, MD;  Location: [facility];  Service: General;  Laterality: N/A;   moles  06/2013   removed 2 moles from under arm and  lowback   NECK SURGERY     occipital nerve damage- injections    OVARIAN CYST REMOVAL  1970   PILONIDAL CYST EXCISION  1975   RADIOACTIVE SEED GUIDED EXCISIONAL BREAST BIOPSY Right 06/23/2021   Procedure: RADIOACTIVE SEED GUIDED EXCISIONAL RIGHT BREAST BIOPSY;  Surgeon: Lunger Donnice, MD;  Location: [facility];  Service: General;  Laterality: Right;   RETINAL TEAR REPAIR CRYOTHERAPY Right 01/2016   [place]   SKIN CANCER EXCISION     TEE WITHOUT CARDIOVERSION N/A 11/12/2022   Procedure: TRANSESOPHAGEAL ECHOCARDIOGRAM;  Surgeon: Perla Evalene PARAS, MD;  Location: [facility];  Service: Cardiovascular;  Laterality: N/A;   THYROID  LOBECTOMY Left 08/21/2022   Procedure: LEFT THYROID  LOBECTOMY;  Surgeon: Eletha Boas, MD;  Location: [facility];  Service: General;  Laterality: Left;   THYROIDECTOMY, PARTIAL     TONSILLECTOMY     TUBAL LIGATION  Think 1994   UPPER GI ENDOSCOPY  06/08/2013   Procedure: UPPER GI ENDOSCOPY;  Surgeon: Donnice KATHEE Lunger, MD;  Location: [facility];  Service: General;;   US   ECHOCARDIOGRAPHY  12/2011   WNL - EF 55-60%, mild MR, grade 1 diastolic dysnfiction (mild)   VENTRAL HERNIA REPAIR  2000    Physical Exam   Triage Vital Signs: ED Triage Vitals  Encounter Vitals Group     BP 05/01/24 2052 111/80     Girls Systolic BP Percentile --      Girls Diastolic BP Percentile --      Boys Systolic BP Percentile --      Boys Diastolic BP Percentile --      Pulse Rate 05/01/24 2049 ROLLEN)  122     Resp 05/01/24 2049 18     Temp 05/01/24 2049 97.8 F (36.6 C)     Temp Source 05/01/24 2049 Oral     SpO2 05/01/24 2049 100 %     Weight 05/01/24 2049 269 lb (122 kg)     Height 05/01/24 2049 5' 6 (1.676 m)     Head Circumference --      Peak Flow --      Pain Score 05/01/24 2049 0     Pain Loc --      Pain Education --      Exclude from Growth Chart --     Most recent vital signs: Vitals:   05/01/24 2130 05/01/24 2245  BP: (!) 93/47 120/89  Pulse: (!) 101 (!) 57  Resp: 16 19  Temp:    SpO2: 99% 100%    General: Awake, no distress.  CV:  Good peripheral perfusion.  Irregular rhythm, tachycardia heart rate 120 Resp:  Normal effort.  Clear lungs Abd:  No distention.  Soft with left lower quadrant tenderness Other:  No lower extremity edema   ED Results / Procedures / Treatments   Labs (all labs ordered are listed, but only abnormal results are displayed) Labs Reviewed  BASIC METABOLIC PANEL WITH GFR - Abnormal; Notable for the following components:      Result Value   Glucose, Bld 101 (*)    BUN 27 (*)    All other components within normal limits  CBC WITH DIFFERENTIAL/PLATELET  URINALYSIS, W/ REFLEX TO CULTURE (INFECTION SUSPECTED)  TROPONIN T, HIGH SENSITIVITY     EKG Interpreted by me Atrial fibrillation, rate 105.  Normal axis, normal intervals.  No acute ischemic changes.   RADIOLOGY CT abdomen pelvis pending   PROCEDURES:  Procedures   MEDICATIONS ORDERED IN ED: Medications  LORazepam  (ATIVAN ) injection 1 mg (has no  administration in time range)  diltiazem  (CARDIZEM ) injection 20 mg (20 mg Intravenous Given 05/01/24 2243)  sodium chloride  0.9 % bolus 1,000 mL (1,000 mLs Intravenous New Bag/Given 05/01/24 2242)  iohexol  (OMNIPAQUE ) 350 MG/ML injection 100 mL (100 mLs Intravenous Contrast Given 05/01/24 2318)     IMPRESSION / MDM / ASSESSMENT AND PLAN / ED COURSE  I reviewed the triage vital signs and the nursing notes.  DDx: Paroxysmal atrial fibrillation, anemia, electrolyte derangement, AKI, diverticulitis  Patient's presentation is most consistent with acute presentation with potential threat to life or bodily function.  Patient comes the ED with feeling like she is in atrial fibrillation, and EKG does confirm atrial fibrillation with mild tachycardia.  Also has some left lower quadrant abdominal pain and tenderness, will obtain CT to evaluate for diverticulitis.  She is nontoxic, not septic.  Initial labs unremarkable.  After IV diltiazem , rate controlled with a heart rate of 70-90.  Will give oral diltiazem . CT Abdo pelvis and urinalysis pending.       FINAL CLINICAL IMPRESSION(S) / ED DIAGNOSES   Final diagnoses:  PAF (paroxysmal atrial fibrillation) (HCC)     Rx / DC Orders   ED Discharge Orders          Ordered    Ambulatory referral to Cardiology       Comments: If you have not heard from the Cardiology office within the next 72 hours please call [phone].   05/01/24 2319             Note:  This document was prepared using Dragon voice recognition software and may  include unintentional dictation errors.   Viviann Pastor, MD 05/01/24 [phone]

## 2024-05-01 NOTE — ED Triage Notes (Signed)
 Pt presents for dry mouth and palpitations. Hx afib. States this feels similar. Denies chest pain, SOB, nausea, vomiting. Endorsing lightheadedness.

## 2024-05-01 NOTE — ED Notes (Signed)
Gave pt pillow at this time.

## 2024-05-02 ENCOUNTER — Telehealth: Payer: Self-pay

## 2024-05-02 ENCOUNTER — Encounter: Payer: Self-pay | Admitting: Family Medicine

## 2024-05-02 DIAGNOSIS — R935 Abnormal findings on diagnostic imaging of other abdominal regions, including retroperitoneum: Secondary | ICD-10-CM

## 2024-05-02 DIAGNOSIS — N2889 Other specified disorders of kidney and ureter: Secondary | ICD-10-CM

## 2024-05-02 NOTE — Telephone Encounter (Signed)
 Patient reached out to cardiology following her ED visit earlier in the night for A-fib with RVR with a question of medical therapy she is supposed to be on following discharge.  She presented to the ED in A-fib with rates at home in 160s.  In our ED, she was given IV fluids, Dilt drip and rates improved.  Cardiology was not consulted and the patient was sent home with the plan for outpatient follow-up.  She has some diltiazem  30 mg tablets at home and was told to continue taking those as needed.  She is also taking verapamil  120 mg extended release for blood pressure.  We had a discussion about various options including going back to the ED, but the patient preferred to stay at home as she is not feeling poorly at this time.  She she knows to come to the ED should her symptoms worsen.  The patient is comfortable taking as needed diltiazem  30 mg tablets until next week when she can touch base with her outpatient cardiologist.  We had a brief discussion about anticoagulation, and she said that she was previously on apixaban  5 mg twice daily, but stopped taking it due to bruising issues.

## 2024-05-04 ENCOUNTER — Telehealth: Payer: Self-pay | Admitting: Family Medicine

## 2024-05-04 NOTE — Telephone Encounter (Signed)
 You will need to see patient in office to send referral right?

## 2024-05-04 NOTE — Telephone Encounter (Signed)
 Also see patient's MyChart message in regards to this referral.

## 2024-05-04 NOTE — Telephone Encounter (Signed)
 Copied from CRM #[id number]. Topic: Referral - Request for Referral >> May 04, 2024  8:21 AM Ahlexyia S wrote: Did the patient discuss referral with their provider in the last year? No (If No - schedule appointment) (If Yes - send message)  Appointment offered? Yes  Type of order/referral and detailed reason for visit: Nephrologist  Preference of office, provider, location: No preference  If referral order, have you been seen by this specialty before? No (If Yes, this issue or another issue? When? Where?  Can we respond through MyChart? Yes

## 2024-05-05 ENCOUNTER — Encounter: Payer: Self-pay | Admitting: Oncology

## 2024-05-05 NOTE — Addendum Note (Signed)
 Addended by: AVELINA NO E on: 05/05/2024 12:20 PM   Modules accepted: Orders

## 2024-05-05 NOTE — Telephone Encounter (Signed)
 I sent a message through MyChart to the patient to let her know I am placing a stat referral to urology for right renal mass.  Please send a message through [place] stat team's to let them know about this stat referral.  She has no location preference and wants soonest available.  Dr. Twylla was recommended though.

## 2024-05-05 NOTE — Telephone Encounter (Signed)
 STAT message sent to Referral Pool.  I will also forward message to them as well.

## 2024-05-06 NOTE — Telephone Encounter (Signed)
 This was addressed via the STAT Chat  Referral sent to [facility] and they  reviewed and scheduled the patient for 05/18/24 at 3:30pm.

## 2024-05-08 ENCOUNTER — Encounter: Payer: Self-pay | Admitting: Cardiology

## 2024-05-13 ENCOUNTER — Ambulatory Visit

## 2024-05-13 VITALS — BP 130/81 | HR 63 | Ht 66.0 in | Wt 269.0 lb

## 2024-05-13 DIAGNOSIS — Z0000 Encounter for general adult medical examination without abnormal findings: Secondary | ICD-10-CM

## 2024-05-13 NOTE — Progress Notes (Signed)
 Chief Complaint  Patient presents with   Medicare Wellness     Subjective:   Kimberly Patton is a 68 y.o. female who presents for a Medicare Annual Wellness Visit.  Visit info / Clinical Intake: Medicare Wellness Visit Type:: Subsequent Annual Wellness Visit Persons participating in visit and providing information:: patient Medicare Wellness Visit Mode:: Telephone If telephone:: video declined Since this visit was completed virtually, some vitals may be partially provided or unavailable. Missing vitals are due to the limitations of the virtual format.: Documented vitals are patient reported If Telephone or Video please confirm:: I connected with patient using audio/video enable telemedicine. I verified patient identity with two identifiers, discussed telehealth limitations, and patient agreed to proceed. Patient Location:: home Provider Location:: home office Interpreter Needed?: No Pre-visit prep was completed: yes AWV questionnaire completed by patient prior to visit?: yes Living arrangements:: (Patient-Rptd) with family/others Patient's Overall Health Status Rating: (!) (Patient-Rptd) fair Typical amount of pain: (Patient-Rptd) some Does pain affect daily life?: (!) (Patient-Rptd) yes  Dietary Habits and Nutritional Risks How many meals a day?: (Patient-Rptd) 2 Eats fruit and vegetables daily?: (Patient-Rptd) yes Most meals are obtained by: (Patient-Rptd) preparing own meals In the last 2 weeks, have you had any of the following?: unintentional weight gain Diabetic:: no  Functional Status Activities of Daily Living (to include ambulation/medication): (Patient-Rptd) Independent Ambulation: (Patient-Rptd) Independent Medication Administration: (Patient-Rptd) Independent Home Management (perform basic housework or laundry): (Patient-Rptd) Independent Manage your own finances?: (Patient-Rptd) yes Primary transportation is: (Patient-Rptd) driving Concerns about hearing?:  no  Fall Screening Falls in the past year?: (Patient-Rptd) 0 Number of falls in past year: (Patient-Rptd) 0 Was there an injury with Fall?: (Patient-Rptd) 0 Fall Risk Category Calculator: (Patient-Rptd) 1 Patient Fall Risk Level: (Patient-Rptd) Low Fall Risk  Fall Risk Patient at Risk for Falls Due to: No Fall Risks Fall risk Follow up: Falls evaluation completed; Education provided  Home and Transportation Safety: All rugs have non-skid backing?: (Patient-Rptd) yes All stairs or steps have railings?: (!) (Patient-Rptd) no Grab bars in the bathtub or shower?: (!) (Patient-Rptd) no Have non-skid surface in bathtub or shower?: (Patient-Rptd) yes Good home lighting?: (Patient-Rptd) yes Regular seat belt use?: (Patient-Rptd) yes Hospital stays in the last year:: (Patient-Rptd) no  Cognitive Assessment Difficulty concentrating, remembering, or making decisions? : (Patient-Rptd) no Will 6CIT or Mini Cog be Completed: yes What year is it?: 0 points What month is it?: 0 points Give patient an address phrase to remember (5 components): [address] [place] About what time is it?: 0 points Count backwards from 20 to 1: 0 points Say the months of the year in reverse: 0 points Repeat the address phrase from earlier: 0 points 6 CIT Score: 0 points  Advance Directives (For Healthcare) Does Patient Have a Medical Advance Directive?: No Type of Advance Directive: Healthcare Power of Violet; Living will Would patient like information on creating a medical advance directive?: No - Patient declined  Reviewed/Updated  Reviewed/Updated: Reviewed All (Medical, Surgical, Family, Medications, Allergies, Care Teams, Patient Goals); Medical History; Surgical History; Family History; Medications; Allergies; Care Teams; Patient Goals    Allergies (verified) Wasp venom, Chlorhexidine  gluconate, Metronidazole, Prednisone , Other, and Sodium hypochlorite   Current Medications  (verified) Outpatient Encounter Medications as of 05/13/2024  Medication Sig   aspirin  EC 81 MG tablet Take 81 mg by mouth daily. Swallow whole.   Calcium  Citrate-Vitamin D  (CITRACAL + D PO) Take 2 tablets by mouth daily.    Cholecalciferol (VITAMIN D3)  1.25 MG (50000 UT) CAPS TAKE 1 CAPSULE BY MOUTH ONE TIME PER WEEK   Coenzyme Q10-Vitamin E  (QUNOL ULTRA COQ10) 100-150 MG-UNIT CAPS Take 100 mg by mouth daily.   Cyanocobalamin  (VITAMIN B-12) 2500 MCG SUBL Place 2,500 mcg under the tongue daily.   diltiazem  (CARDIZEM ) 30 MG tablet Take 30 mg by mouth 2 (two) times daily as needed.   esomeprazole (NEXIUM) 20 MG capsule Take 20 mg by mouth daily at 12 noon.   furosemide  (LASIX ) 20 MG tablet Take 1 tablet (20 mg total) by mouth daily as needed.   Lactobacillus Rhamnosus, GG, (CULTURELLE IMMUNITY SUPPORT PO) Take 1 capsule by mouth daily.   levalbuterol  (XOPENEX  HFA) 45 MCG/ACT inhaler Inhale 2 puffs into the lungs every 6 (six) hours as needed for wheezing.   Menaquinone-7 (VITAMIN K2  PO) Take 100 mg by mouth daily.   Multiple Vitamins-Minerals (CENTRUM SILVER 50+WOMEN PO) Take 1 tablet by mouth daily.   nitroGLYCERIN  (NITROSTAT ) 0.4 MG SL tablet Place 1 tablet (0.4 mg total) under the tongue every 5 (five) minutes as needed for chest pain.   Riboflavin  400 MG CAPS Take 400 mg by mouth daily.   rosuvastatin  (CRESTOR ) 10 MG tablet Take 1 tablet (10 mg total) by mouth daily.   spironolactone  (ALDACTONE ) 25 MG tablet Take 1 tablet (25 mg total) by mouth daily.   traMADol  (ULTRAM ) 50 MG tablet Take 1 tablet (50 mg total) by mouth every 6 (six) hours as needed.   TURMERIC PO Take 2 tablets by mouth daily. 500 mg  With Ginger 50 mg Gummies   verapamil  (CALAN -SR) 120 MG CR tablet TAKE 1 TABLET BY MOUTH 2 TIMES DAILY.   [DISCONTINUED] amoxicillin  (AMOXIL ) 500 MG capsule Take 2 capsules (1,000 mg total) by mouth 2 (two) times daily.   [DISCONTINUED] methocarbamol  (ROBAXIN ) 500 MG tablet Take 500 mg by  mouth at bedtime as needed for muscle spasms. (Patient not taking: Reported on 04/20/2024)   Facility-Administered Encounter Medications as of 05/13/2024  Medication   albuterol  (PROVENTIL ) (2.5 MG/3ML) 0.083% nebulizer solution 2.5 mg    History: Past Medical History:  Diagnosis Date   ALLERGIC RHINITIS 04/03/2007   Allergy    Not sure   Anemia    ((Pt Qnr Sub: Denies at visit from 08/28/2021 with endocrinology))    Anginal pain    Anxiety    Arthritis Not sure   Asthma    hx of years ago - no longer a problem    ASTHMA, PERSISTENT, MODERATE 04/03/2007   Back pain    Cataract    Chest pain    Depression screen 01/08/2023   Dyspnea    with exertion   Dyspnea on exertion    a. 11/2007 Echo: EF 60%.   Dysrhythmia    hx of heart arrhythmia 10-15 years ago - followed by DR Waddell - not seen in years    Empty sella syndrome    GERD (gastroesophageal reflux disease)    Headache    Hemophilia carrier    Hiatal hernia    History of kidney stones    Hypercholesteremia 06/09/2019   Hypertension    Joint pain    HIPS / LEGS   Joint pain    Knee injury    RT   Lymph edema    bilateral legs   Meningioma (HCC)    Morbid obesity (HCC)    Neuromuscular disorder (HCC) Not sure, since my fall   Occipital neuralgia    Osteoarthritis  Palpitations    Paroxysmal SVT (supraventricular tachycardia)    a. 11/2011 48h Holter: RSR, rare PVC's, occas PAC's   Retinal tear of right eye 01/2016   Skin cancer    SOB (shortness of breath)    Thyroid  nodule 04/15/2018   Ventral hernia    Past Surgical History:  Procedure Laterality Date   ABDOMINAL HYSTERECTOMY  Think 1994   APPENDECTOMY  1996   ARTERY BIOPSY Left 03/21/2022   Procedure: BIOPSY TEMPORAL ARTERY;  Surgeon: Jama Cordella MATSU, MD;  Location: [facility]S;  Service: Vascular;  Laterality: Left;   BIOPSY THYROID      08/2021   BREAST CYST EXCISION Right 1994   Benign   CARDIOVERSION N/A 11/12/2022   Procedure: CARDIOVERSION;   Surgeon: Perla Evalene PARAS, MD;  Location: [facility];  Service: Cardiovascular;  Laterality: N/A;   CESAREAN SECTION     x2   CHOLECYSTECTOMY  1995   COLONOSCOPY N/A 04/16/2024   Procedure: COLONOSCOPY;  Surgeon: Therisa Bi, MD;  Location: [facility] ENDOSCOPY;  Service: Gastroenterology;  Laterality: N/A;  DM/Eliquis  Early morning appointment   COLONOSCOPY WITH PROPOFOL  N/A 05/09/2021   Procedure: COLONOSCOPY WITH PROPOFOL ;  Surgeon: Therisa Bi, MD;  Location: [facility] ENDOSCOPY;  Service: Gastroenterology;  Laterality: N/A;   ESOPHAGOGASTRODUODENOSCOPY (EGD) WITH PROPOFOL  N/A 11/02/2022   Procedure: ESOPHAGOGASTRODUODENOSCOPY (EGD) WITH PROPOFOL ;  Surgeon: Maryruth Ole DASEN, MD;  Location: [facility] ENDOSCOPY;  Service: Endoscopy;  Laterality: N/A;   EYE SURGERY  01/2016   Repair retinal tear   FOOT SURGERY  1995 / 1996   x2   HERNIA REPAIR     KNEE ARTHROSCOPY W/ MENISCAL REPAIR  07/25/2013   right knee Dr. Josefina   LAPAROSCOPIC GASTRIC SLEEVE RESECTION N/A 06/08/2013   Procedure: LAPAROSCOPIC GASTRIC SLEEVE RESECTION AND EXCISION OF SEBACEUS CYST FROM MID CHEST takedown of incarcerated ventral hernia and primary repair endoscopy;  Surgeon: Donnice KATHEE Lunger, MD;  Location: [facility];  Service: General;  Laterality: N/A;   LAPAROSCOPIC NISSEN FUNDOPLICATION N/A 02/02/2020   Procedure: LAPAROSCOPIC REPAIR LARGE SYMPTOMATIC HIATAL HERNIA WITH UPPER ENDOSCOPY;  Surgeon: Lunger Donnice, MD;  Location: [facility];  Service: General;  Laterality: N/A;   moles  06/2013   removed 2 moles from under arm and  lowback   NECK SURGERY     occipital nerve damage- injections    OVARIAN CYST REMOVAL  1970   PILONIDAL CYST EXCISION  1975   RADIOACTIVE SEED GUIDED EXCISIONAL BREAST BIOPSY Right 06/23/2021   Procedure: RADIOACTIVE SEED GUIDED EXCISIONAL RIGHT BREAST BIOPSY;  Surgeon: Lunger Donnice, MD;  Location: [facility];  Service: General;  Laterality: Right;   RETINAL TEAR REPAIR CRYOTHERAPY Right  01/2016   [place]   SKIN CANCER EXCISION     TEE WITHOUT CARDIOVERSION N/A 11/12/2022   Procedure: TRANSESOPHAGEAL ECHOCARDIOGRAM;  Surgeon: Perla Evalene PARAS, MD;  Location: [facility];  Service: Cardiovascular;  Laterality: N/A;   THYROID  LOBECTOMY Left 08/21/2022   Procedure: LEFT THYROID  LOBECTOMY;  Surgeon: Eletha Boas, MD;  Location: [facility];  Service: General;  Laterality: Left;   THYROIDECTOMY, PARTIAL     TONSILLECTOMY     TUBAL LIGATION  Think 1994   UPPER GI ENDOSCOPY  06/08/2013   Procedure: UPPER GI ENDOSCOPY;  Surgeon: Donnice KATHEE Lunger, MD;  Location: [facility];  Service: General;;   US  ECHOCARDIOGRAPHY  12/2011   WNL - EF 55-60%, mild MR, grade 1 diastolic dysnfiction (mild)   VENTRAL HERNIA REPAIR  2000   Family History  Problem Relation Age of Onset   Breast cancer Mother    Diabetes Mother    Hypertension Mother    Kidney failure Mother    Heart disease Mother    Obesity Mother    Cancer Mother    Arthritis Mother    Diabetes Father    Hypertension Father    Heart disease Father    Diabetes Other    Hypertension Other    Stroke Other    Hemophilia Other    Kidney disease Sister    Arthritis Sister    Obesity Sister    Diabetes Brother    Obesity Brother    Varicose Veins Sister    Social History   Occupational History   Occupation: Magazine Features Editor: [employer]   Occupation: Retired Runner, Broadcasting/film/video  Tobacco Use   Smoking status: Never   Smokeless tobacco: Never   Tobacco comments:    smoke at age [age]  Vaping Use   Vaping status: Never Used  Substance and Sexual Activity   Alcohol use: Not Currently   Drug use: Never   Sexual activity: Not Currently    Partners: Male    Birth control/protection: Abstinence   Tobacco Counseling Counseling given: Not Answered Tobacco comments: smoke at age 20-12  SDOH Screenings   Food Insecurity: No Food Insecurity (03/21/2024)  Housing: Low Risk  (03/21/2024)  Transportation Needs: No  Transportation Needs (03/21/2024)  Utilities: Not At Risk (12/31/2023)   Received from [facility]  Alcohol Screen: Low Risk  (05/08/2022)  Depression (PHQ2-9): Low Risk  (05/13/2024)  Financial Resource Strain: Low Risk  (03/21/2024)  Physical Activity: Inactive (03/21/2024)  Social Connections: Moderately Integrated (03/21/2024)  Stress: No Stress Concern Present (05/13/2024)  Tobacco Use: Low Risk  (05/13/2024)  Health Literacy: Adequate Health Literacy (05/13/2023)   See flowsheets for full screening details  Depression Screen PHQ 2 & 9 Depression Scale- Over the past 2 weeks, how often have you been bothered by any of the following problems? Little interest or pleasure in doing things: 0 Feeling down, depressed, or hopeless (PHQ Adolescent also includes...irritable): 1 PHQ-2 Total Score: 1 Trouble falling or staying asleep, or sleeping too much: 0 Feeling tired or having little energy: 0 Poor appetite or overeating (PHQ Adolescent also includes...weight loss): 0 Feeling bad about yourself - or that you are a failure or have let yourself or your family down: 0 Trouble concentrating on things, such as reading the newspaper or watching television (PHQ Adolescent also includes...like school work): 0 Moving or speaking so slowly that other people could have noticed. Or the opposite - being so fidgety or restless that you have been moving around a lot more than usual: 0 Thoughts that you would be better off dead, or of hurting yourself in some way: 0 PHQ-9 Total Score: 0 If you checked off any problems, how difficult have these problems made it for you to do your work, take care of things at home, or get along with other people?: Not difficult at all     Goals Addressed               This Visit's Progress     Lose weight (pt-stated)   Not on track     Pt is going to [facility] in [place]; she wants continue this goals      Weight (lb) < 200 lb (90.7 kg) (pt-stated)         Still trying to loose wt  Objective:    There were no vitals filed for this visit. There is no height or weight on file to calculate BMI.  Hearing/Vision screen Hearing Screening - Comments:: Pt denies hearing dif Vision Screening - Comments:: Pt wear glasse/pt goes to [facility] in [place], [place] last ov 2wk ago Immunizations and Health Maintenance Health Maintenance  Topic Date Due   DTaP/Tdap/Td (3 - Td or Tdap) 01/19/2025   Medicare Annual Wellness (AWV)  05/13/2025   Mammogram  07/14/2025   Bone Density Scan  09/10/2025   Colonoscopy  04/17/2027   Pneumococcal Vaccine: 50+ Years  Completed   Influenza Vaccine  Completed   Hepatitis C Screening  Completed   Meningococcal B Vaccine  Aged Out   COVID-19 Vaccine  Discontinued   Zoster Vaccines- Shingrix  Discontinued        Assessment/Plan:  This is a routine wellness examination for Kenni.  Patient Care Team: Avelina Greig BRAVO, MD as PCP - General (Family Medicine) Darliss Rogue, MD as PCP - Cardiology (Cardiology) Cindie Ole DASEN, MD as PCP - Electrophysiology (Cardiology) Heniford, Francis, MD as Referring Physician (Surgery) Tamea Dedra CROME, MD as Consulting Physician (Pulmonary Disease) Eletha Boas, MD as Consulting Physician (General Surgery) Jaye Fallow, MD as Referring Physician (Ophthalmology) Melanee Annah BROCKS, MD as Consulting Physician (Oncology)  I have personally reviewed and noted the following in the patient's chart:   Medical and social history Use of alcohol, tobacco or illicit drugs  Current medications and supplements including opioid prescriptions. Functional ability and status Nutritional status Physical activity Advanced directives List of other physicians Hospitalizations, surgeries, and ER visits in previous 12 months Vitals Screenings to include cognitive, depression, and falls Referrals and appointments  No orders of the defined types were placed in  this encounter.  In addition, I have reviewed and discussed with patient certain preventive protocols, quality metrics, and best practice recommendations. A written personalized care plan for preventive services as well as general preventive health recommendations were provided to patient.   Ozie Ned, CMA   05/13/2024   Return in 1 year (on 05/13/2025).  After Visit Summary: (MyChart) Due to this being a telephonic visit, the after visit summary with patients personalized plan was offered to patient via MyChart   Nurse Notes: n/a

## 2024-05-15 ENCOUNTER — Ambulatory Visit: Admitting: Urology

## 2024-05-15 ENCOUNTER — Encounter: Payer: Self-pay | Admitting: Urology

## 2024-05-15 VITALS — BP 125/81 | HR 76 | Ht 66.0 in | Wt 269.0 lb

## 2024-05-15 DIAGNOSIS — N2889 Other specified disorders of kidney and ureter: Secondary | ICD-10-CM | POA: Diagnosis not present

## 2024-05-15 NOTE — Progress Notes (Signed)
 05/15/2024 8:10 AM   Kimberly Patton May 31, 1956 [id number]  Referring provider: Avelina Greig BRAVO, MD [address] [place],  [place] [address]  Chief Complaint  Patient presents with   Other    Renal mass    HPI: Kimberly Patton is a 68 y.o. female who presents for evaluation of an incidental right renal mass.  ED visit 05/01/2024 for palpitations As part of her evaluation a CT of the abdomen pelvis was performed which showed a 4 cm enhancing renal mass No flank, abdominal or pelvic pain Denies gross hematuria Has urinary frequency and history recurrent UTI   PMH: Past Medical History:  Diagnosis Date   ALLERGIC RHINITIS 04/03/2007   Allergy    Not sure   Anemia    ((Pt Qnr Sub: Denies at visit from 08/28/2021 with endocrinology))    Anginal pain    Anxiety    Arthritis Not sure   Asthma    hx of years ago - no longer a problem    ASTHMA, PERSISTENT, MODERATE 04/03/2007   Back pain    Cataract    Chest pain    Depression screen 01/08/2023   Dyspnea    with exertion   Dyspnea on exertion    a. 11/2007 Echo: EF 60%.   Dysrhythmia    hx of heart arrhythmia 10-15 years ago - followed by DR Patton - not seen in years    Empty sella syndrome    GERD (gastroesophageal reflux disease)    Headache    Hemophilia carrier    Hiatal hernia    History of kidney stones    Hypercholesteremia 06/09/2019   Hypertension    Joint pain    HIPS / LEGS   Joint pain    Knee injury    RT   Lymph edema    bilateral legs   Meningioma (HCC)    Morbid obesity (HCC)    Neuromuscular disorder (HCC) Not sure, since my fall   Occipital neuralgia    Osteoarthritis    Palpitations    Paroxysmal SVT (supraventricular tachycardia)    a. 11/2011 48h Holter: RSR, rare PVC's, occas PAC's   Retinal tear of right eye 01/2016   Skin cancer    SOB (shortness of breath)    Thyroid  nodule 04/15/2018   Ventral hernia     Surgical History: Past Surgical History:  Procedure  Laterality Date   ABDOMINAL HYSTERECTOMY  Think 1994   APPENDECTOMY  1996   ARTERY BIOPSY Left 03/21/2022   Procedure: BIOPSY TEMPORAL ARTERY;  Surgeon: Jama Cordella MATSU, MD;  Location: [facility]S;  Service: Vascular;  Laterality: Left;   BIOPSY THYROID      08/2021   BREAST CYST EXCISION Right 1994   Benign   CARDIOVERSION N/A 11/12/2022   Procedure: CARDIOVERSION;  Surgeon: Perla Evalene PARAS, MD;  Location: [facility];  Service: Cardiovascular;  Laterality: N/A;   CESAREAN SECTION     x2   CHOLECYSTECTOMY  1995   COLONOSCOPY N/A 04/16/2024   Procedure: COLONOSCOPY;  Surgeon: Therisa Bi, MD;  Location: [facility] ENDOSCOPY;  Service: Gastroenterology;  Laterality: N/A;  DM/Eliquis  Early morning appointment   COLONOSCOPY WITH PROPOFOL  N/A 05/09/2021   Procedure: COLONOSCOPY WITH PROPOFOL ;  Surgeon: Therisa Bi, MD;  Location: [facility] ENDOSCOPY;  Service: Gastroenterology;  Laterality: N/A;   ESOPHAGOGASTRODUODENOSCOPY (EGD) WITH PROPOFOL  N/A 11/02/2022   Procedure: ESOPHAGOGASTRODUODENOSCOPY (EGD) WITH PROPOFOL ;  Surgeon: Maryruth Ole DASEN, MD;  Location: [facility] ENDOSCOPY;  Service: Endoscopy;  Laterality:  N/A;   EYE SURGERY  01/2016   Repair retinal tear   FOOT SURGERY  1995 / 1996   x2   HERNIA REPAIR     KNEE ARTHROSCOPY W/ MENISCAL REPAIR  07/25/2013   right knee Dr. Josefina   LAPAROSCOPIC GASTRIC SLEEVE RESECTION N/A 06/08/2013   Procedure: LAPAROSCOPIC GASTRIC SLEEVE RESECTION AND EXCISION OF SEBACEUS CYST FROM MID CHEST takedown of incarcerated ventral hernia and primary repair endoscopy;  Surgeon: Donnice KATHEE Lunger, MD;  Location: [facility];  Service: General;  Laterality: N/A;   LAPAROSCOPIC NISSEN FUNDOPLICATION N/A 02/02/2020   Procedure: LAPAROSCOPIC REPAIR LARGE SYMPTOMATIC HIATAL HERNIA WITH UPPER ENDOSCOPY;  Surgeon: Lunger Donnice, MD;  Location: [facility];  Service: General;  Laterality: N/A;   moles  06/2013   removed 2 moles from under arm and  lowback   NECK SURGERY     occipital  nerve damage- injections    OVARIAN CYST REMOVAL  1970   PILONIDAL CYST EXCISION  1975   RADIOACTIVE SEED GUIDED EXCISIONAL BREAST BIOPSY Right 06/23/2021   Procedure: RADIOACTIVE SEED GUIDED EXCISIONAL RIGHT BREAST BIOPSY;  Surgeon: Lunger Donnice, MD;  Location: [facility];  Service: General;  Laterality: Right;   RETINAL TEAR REPAIR CRYOTHERAPY Right 01/2016   [place]   SKIN CANCER EXCISION     TEE WITHOUT CARDIOVERSION N/A 11/12/2022   Procedure: TRANSESOPHAGEAL ECHOCARDIOGRAM;  Surgeon: Perla Evalene PARAS, MD;  Location: [facility];  Service: Cardiovascular;  Laterality: N/A;   THYROID  LOBECTOMY Left 08/21/2022   Procedure: LEFT THYROID  LOBECTOMY;  Surgeon: Eletha Boas, MD;  Location: [facility];  Service: General;  Laterality: Left;   THYROIDECTOMY, PARTIAL     TONSILLECTOMY     TUBAL LIGATION  Think 1994   UPPER GI ENDOSCOPY  06/08/2013   Procedure: UPPER GI ENDOSCOPY;  Surgeon: Donnice KATHEE Lunger, MD;  Location: [facility];  Service: General;;   US  ECHOCARDIOGRAPHY  12/2011   WNL - EF 55-60%, mild MR, grade 1 diastolic dysnfiction (mild)   VENTRAL HERNIA REPAIR  2000    Home Medications:  Allergies as of 05/15/2024       Reactions   Wasp Venom Anaphylaxis   Chlorhexidine  Gluconate    Itching under breasts   Metronidazole Other (See Comments)   Chest tightness, neck tightness   Prednisone  Other (See Comments)   Severe migraine / with taper dose Told by Cardiologist to avoid due to Afib   Other Rash   Bleach   Sodium Hypochlorite Rash   Liquid clorox bleach        Medication List        Accurate as of May 15, 2024  8:10 AM. If you have any questions, ask your nurse or doctor.          aspirin  EC 81 MG tablet Take 81 mg by mouth daily. Swallow whole.   CENTRUM SILVER 50+WOMEN PO Take 1 tablet by mouth daily.   CITRACAL + D PO Take 2 tablets by mouth daily.   CULTURELLE IMMUNITY SUPPORT PO Take 1 capsule by mouth daily.   diltiazem  30 MG  tablet Commonly known as: CARDIZEM  Take 30 mg by mouth 2 (two) times daily as needed.   esomeprazole 20 MG capsule Commonly known as: NEXIUM Take 20 mg by mouth daily at 12 noon.   furosemide  20 MG tablet Commonly known as: LASIX  Take 1 tablet (20 mg total) by mouth daily as needed.   levalbuterol  45 MCG/ACT inhaler Commonly known as: XOPENEX  HFA Inhale  2 puffs into the lungs every 6 (six) hours as needed for wheezing.   nitroGLYCERIN  0.4 MG SL tablet Commonly known as: NITROSTAT  Place 1 tablet (0.4 mg total) under the tongue every 5 (five) minutes as needed for chest pain.   Qunol Ultra CoQ10 100-150 MG-UNIT Caps Generic drug: Coenzyme Q10-Vitamin E  Take 100 mg by mouth daily.   Riboflavin  400 MG Caps Take 400 mg by mouth daily.   rosuvastatin  10 MG tablet Commonly known as: CRESTOR  Take 1 tablet (10 mg total) by mouth daily.   spironolactone  25 MG tablet Commonly known as: ALDACTONE  Take 1 tablet (25 mg total) by mouth daily.   traMADol  50 MG tablet Commonly known as: ULTRAM  Take 1 tablet (50 mg total) by mouth every 6 (six) hours as needed.   TURMERIC PO Take 2 tablets by mouth daily. 500 mg  With Ginger 50 mg Gummies   verapamil  120 MG CR tablet Commonly known as: CALAN -SR TAKE 1 TABLET BY MOUTH 2 TIMES DAILY.   Vitamin B-12 2500 MCG Subl Place 2,500 mcg under the tongue daily.   Vitamin D3 1.25 MG (50000 UT) Caps TAKE 1 CAPSULE BY MOUTH ONE TIME PER WEEK   VITAMIN K2  PO Take 100 mg by mouth daily.        Allergies:  Allergies  Allergen Reactions   Wasp Venom Anaphylaxis   Chlorhexidine  Gluconate     Itching under breasts   Metronidazole Other (See Comments)    Chest tightness, neck tightness   Prednisone  Other (See Comments)    Severe migraine / with taper dose Told by Cardiologist to avoid due to Afib   Other Rash    Bleach   Sodium Hypochlorite Rash    Liquid clorox bleach    Family History: Family History  Problem Relation Age of  Onset   Breast cancer Mother    Diabetes Mother    Hypertension Mother    Kidney failure Mother    Heart disease Mother    Obesity Mother    Cancer Mother    Arthritis Mother    Diabetes Father    Hypertension Father    Heart disease Father    Diabetes Other    Hypertension Other    Stroke Other    Hemophilia Other    Kidney disease Sister    Arthritis Sister    Obesity Sister    Diabetes Brother    Obesity Brother    Varicose Veins Sister     Social History:  reports that she has never smoked. She has never used smokeless tobacco. She reports that she does not currently use alcohol. She reports that she does not use drugs.   Physical Exam: BP 125/81   Pulse 76   Ht 5' 6 (1.676 m)   Wt 269 lb (122 kg)   BMI 43.42 kg/m   Constitutional:  Alert, No acute distress. HEENT: [place] AT Respiratory: Normal respiratory effort, no increased work of breathing.  Psychiatric: Normal mood and affect.    Pertinent Imaging: CT images were personally reviewed and interpreted.  4 cm enhancing renal mass centrally located near collecting system and hilum.  Assessment & Plan:    1. Renal mass  We discussed CT findings and that mass is consistent with renal cell carcinoma.  Due to location not amenable to percutaneous ablation and will most likely need radical nephrectomy Reviewed CT with Dr. Georganne and he will see in follow-up to discuss management options in detail   Lacy Taglieri C  Twylla, MD  [facility] [address], [address] [place], [place] [address] [phone]

## 2024-05-18 ENCOUNTER — Ambulatory Visit: Admitting: Urology

## 2024-05-18 LAB — MICROSCOPIC EXAMINATION: Epithelial Cells (non renal): >10 /HPF — AB (ref 0–10)

## 2024-05-18 LAB — URINALYSIS, COMPLETE
Bilirubin, UA: NEGATIVE
Glucose, UA: NEGATIVE
Ketones, UA: NEGATIVE
Nitrite, UA: NEGATIVE
Protein,UA: NEGATIVE
Specific Gravity, UA: 1.025 (ref 1.005–1.030)
Urobilinogen, Ur: 0.2 mg/dL (ref 0.2–1.0)
pH, UA: 6 (ref 5.0–7.5)

## 2024-05-18 NOTE — Progress Notes (Signed)
 Subjective:    Kimberly Patton is a 68 y.o. (DOB December 25, 1955) female.  No chief complaint on file.    HPI Diffuse DDD and spondylosis of the cervical spine with forward head posture. Patient comes to clinic today for recheck of her chronic neck pain, chronic thoracolumbar pain, chronic low back pain and med check.  Her symptoms are relatively stable. She reports her neck pain is doing well. Neck is not currently flared up. When symptoms are flared up she struggles with activity due to pain. Currently her primary concern is bilateral LBP, right greater than left, with radiation into right LE.  The right lower extremity symptoms have improved partially since last visit.  Her primary concern is her low back pain today.  Spontaneous onset of right mid thoracic pain since 2015.    Note: Recent abdominal CT scan revealed a tumor on her right kidney.  It is suspicious for malignancy.  She has seen a renal specialist and been referred to a surgeon.  She has an appoint with the surgeon later this week.  She was told they may need to do a nephrectomy because the area of the tumor is not accessible by biopsy.  HISTORY: Chronic neck radiating to her temples. She has mid back and lower back pain as well with right knee pain and paresthesia into the left foot at times. She has seen multiple providers since this exacerbation, including a rheumatologist who r/o any autoimmune condition, including TA (biopsy negative) and her eye doc. She reports that she had previous injections with Dr. Cole that never resulted in hematomas, as she says they did with Spivey. However, it's unclear which injections those might have been -- [facility] states obtained records from Dr Ernesto. Unfortunately the only mention of a procedure states cervical facet nerve injection which is not very helpful. She has been treated for several years by multiple doctors without great relief. She reports RFA which increased her pain. Failed dry  needling and acupuncture -- she reports hematomas with these small gauge needles as well.   Note. Prednisone  dose pack caused her a-fib to worsen.  She has not also had trouble with steroid injections over the last several years, triggering her A-fib.  reaction to Prolia  and Reclast  with temp and dehydration.     Reviewed and updated this visit by provider: Tobacco  Allergies  Meds  Problems  Med Hx  Surg Hx  Fam Hx       Review of Systems  Constitutional:  Negative for activity change, appetite change, chills, diaphoresis, fatigue, fever and unexpected weight change.  Respiratory:  Negative for shortness of breath.   Cardiovascular:  Negative for chest pain.  Genitourinary:  Negative for difficulty urinating.      Objective:   Vitals:   05/18/24 0837  BP: 133/80  Pulse: 58  PainSc:   5    Physical Exam Constitutional:      General: She is not in acute distress.    Appearance: She is not toxic-appearing.  Musculoskeletal:     Lumbar back: Tenderness present. No swelling, edema or deformity. Decreased range of motion. Negative left straight leg raise test.  Neurological:     Mental Status: She is alert and oriented to person, place, and time. Mental status is at baseline.     Sensory: No sensory deficit.     Motor: No weakness or atrophy.     Gait: Gait normal.     Deep Tendon Reflexes:  Reflex Scores:      Patellar reflexes are 2+ on the right side and 2+ on the left side.      Achilles reflexes are 2+ on the right side and 2+ on the left side. Psychiatric:        Attention and Perception: She is attentive.        Mood and Affect: Affect is not inappropriate.        Behavior: Behavior is not aggressive.        Imaging: -MRI of the lumbar spine done on 05/14/2024 reviewed on epic.  Reviewed with patient today. Trace grade 1 anterolisthesis of L4 on L5. The vertebral body heights are maintained. Moderate L5-S1 intervertebral disc height loss with mild height  loss at the remaining levels. Hemangiomas scattered throughout the lumbar spine with the largest in the L2 vertebral body. Mild multilevel edematous endplate signal changes most prominent at T11-T12, nonspecific but often secondary to Modic type I degenerative changes. L1-2: Tiny disc bulge. No significant spinal canal or neural foraminal stenosis. L2-3: No significant spinal canal or neural foraminal stenosis. L3-4: Tiny disc bulge. No significant spinal canal or neural foraminal stenosis. L4-5: Small disc bulge. No significant spinal canal or neural foraminal stenosis. L5-S1: No significant spinal canal stenosis. Facet arthrosis contributes to mild bilateral neural foraminal stenosis. -X-rays of the lumbar spine, 4 views, 04/23/2024.  Reviewed with patient today.  L4-5 grade 1 spondylolisthesis.  No acute findings or fractures noted. -Cervical XR 08/24/21 without acute findings, progression of diffuse facet arthrosis and small grade 1 C4 on C5 anterolisthesis.  -Cervical MRI 07/2021 shows mod L C3-4  and C4-5 foraminal stenosis, mod R C5-6 foraminal stenosis.  -Lumbar MRI 07/2016 without central canal stenosis or foraminal stenosis, mild facet arthropathy L4-5 -Lumbar x-ray 06/01/23, slight progression of L4-5 grade 1 spondylolisthesis compared to 2018 films.  -Thoracic MRI 09/2022 shows a T12 Schmorl's node. No obvious fractures or significant stenosis noted.    Assessment / Plan:   Assessment 1. Spondylolisthesis of lumbar region (Primary) 2. Lumbosacral spondylosis with radiculopathy 3. Chronic bilateral low back pain with left-sided sciatica -     ketoROLAC  tromethamine  (TORADOL ) injection 30 mg; 30 mg, Intramuscular, Once, On Mon 05/18/24 at 0945, For 1 dose 4. Myalgia -     ketoROLAC  tromethamine  (TORADOL ) injection 30 mg; 30 mg, Intramuscular, Once, On Mon 05/18/24 at 0945, For 1 dose 5. Facet arthropathy, lumbar     Plan Patient will participate in activities as tolerated using pain  as their guide. Patient was educated on current diagnosis and typical treatment algorithm. Neck symptoms are currently well managed. On exam, right lower lumbar and buttocks pain, LE strength normal. Symptoms and exam consistent with myofascial pain and aggravation of spondylosis.  MRI of the lumbar spine was reviewed with the patient today.  Tolerated Toradol  IM in office today.  She declined TPI due to steroid injections triggering her A-fib.  Continue chiropractic care if helpful. She typically finds this very helpful in keeping her neck and LBP at a manageable level.  Avoid steroids (oral and injections) due to triggering a-fib.  Continue HEP focusing on core strength and posture. Even if pain does not improve, the strengthening will help her overall well being and decrease fall risk. Continue current meds, Robaxin  and Tramadol , no refills needed today. She has discontinued Gabapentin  100mg  qam.   Cervical spine: Per Dr. Bill previous note 05/02/23; her previous RFA was left C2-6. Her area of pain at this time is  consistent with left C2-3 facet mediated pain and episodes of occipital neuralgia.   Lumbar spine: Symptoms and exam today are consistent with lumbar spondylosis and facet arthropathy at L4-5.  Patient has pain to palpation at the L4-5 level.  She has pain with extension and facet loading.  I would recommend L34 MBB/RFA (L4-5 joint).  She would like to consider this treatment however needs to see surgeon on Friday to determine the next step with her renal issues.  She will contact the office when she is ready to proceed with the above injections.  Procedure: Toradol  30mg  IM injection was administered in office; patient tolerated injection well. Injection side effects and expectations were discussed with the patient.  Procedure was medically necessary due to the patient's moderate to severe pain that was limiting their activity and function.  Reviewed patient medications today. Continued  prescribing of current medications is medically necessary to allow patient to maintain activity and quality of life. They take medications as prescribed. Medications improve their quality of life and level of function. Patient does not display aberrant behavior. Prescription monitoring report reviewed and appropriate. Upon discussion today, patient understands opioid therapy is optional and feels pain has been refractory to reasonable conservative measures and is significant and affecting quality of life enough to warrant ongoing therapy and wishes to continue opioids. PDMP reviewed and appropriate. Discussed medications and prescribing with Dr. Gust who agrees with plan.  Continue tramadol  and Robaxin , no refill needed today.  She has discontinued gabapentin  and will only resume if LE pain persists. ORT reviewed, score 0, low risk.   PMH is complicated. Retinal detachment s/p repair, left sided facial numbness in V2 distribution, and dizziness. Had thyroid  biopsy 10/2021, which was negative, but goiter recently removed complicated by post op cellulitis. A-fib now on Eliquis .  Depression screening, including interpretation, was performed.  Total time spent performing and reviewing the screening was > 5 minutes.  Questions were encouraged and answered today. Instructed to call with any new questions or concerns. Greater than 30 minutes was spent today reviewing patient chart, reviewing history, physical exam, education and counseling, medication management and documentation.    Patient will follow up as needed when she is ready to proceed with her MBB/RFA.  She will also follow-up with Colleen every 6 months for medication check.  She limits her medications and she can call for refills until that time.  Risks, benefits, and alternatives of the medications and treatment plan prescribed today were discussed, and patient expressed understanding. Plan follow-up as discussed or as needed if any worsening  symptoms or change in condition.

## 2024-05-19 DIAGNOSIS — N2889 Other specified disorders of kidney and ureter: Secondary | ICD-10-CM | POA: Insufficient documentation

## 2024-05-19 NOTE — Progress Notes (Unsigned)
   05/20/2024 7:40 AM   Kimberly Patton [birth year] [id number]  Reason for visit: Follow up Right renal mass   HPI: 68 y.o. female, initial follow up with me today, previously seen by Dr. Twylla Dec 2025  Prior HPI: ED visit 05/01/2024 for palpitations As part of her evaluation a CT of the abdomen pelvis was performed which showed a 4 cm enhancing renal mass No flank, abdominal or pelvic pain Denies gross hematuria Has urinary frequency and history recurrent UTI      Physical Exam: There were no vitals taken for this visit.   General: no acute distress, alert/oriented, conversational  HEENT: equal nondilated pupils CV: regular rate Lung: unlabored breathing, regular rate and rhythm  Abd: nondistended, nontender with palpation, no palpable masses  MSK: moving all extremities without issue, normal observed motor function  Laboratory Data:  Latest Reference Range & Units 05/01/24 22:03  Creatinine 0.44 - 1.00 mg/dL 9.48    Pertinent Imaging: I have personally viewed and interpreted the CT A/P (05/19/24) - 4 cm enhancing Right interpolar renal mass concerning for underlying RCC. Mass is superior and posterior to renal hilum, just adjacent, centrally located. Inferior border of mass seems to have an eccentric border with appearance of additional lobulation. Contralateral left kidney normal, benign interpolar renal cyst. No regional LNA.     Assessment & Plan:    Renal mass, right Assessment & Plan: 4cm Right interpolar enhancing renal mass  - centrally located, adjacent to hilum, posterior  Reviewed her clinical history and recent imaging.   - Recommend radical nephrectomy        Kimberly JONELLE Skye, MD  [facility] [address] [place], [place] [address] [phone]

## 2024-05-19 NOTE — Assessment & Plan Note (Addendum)
 4cm Right interpolar enhancing renal mass  - centrally located, adjacent to hilum, posterior  Reviewed her clinical history and recent imaging.   - Recommend radical nephrectomy

## 2024-05-20 ENCOUNTER — Ambulatory Visit: Admitting: Urology

## 2024-05-20 VITALS — BP 136/82 | HR 70 | Wt 269.0 lb

## 2024-05-20 DIAGNOSIS — N2889 Other specified disorders of kidney and ureter: Secondary | ICD-10-CM | POA: Diagnosis not present

## 2024-05-21 ENCOUNTER — Encounter: Payer: Self-pay | Admitting: Oncology

## 2024-05-22 ENCOUNTER — Ambulatory Visit: Admitting: Urology

## 2024-05-23 ENCOUNTER — Other Ambulatory Visit: Payer: Self-pay | Admitting: Family Medicine

## 2024-05-25 ENCOUNTER — Ambulatory Visit: Admitting: Urology

## 2024-05-25 NOTE — Telephone Encounter (Signed)
 Last office visit 03/31/24 for sinusitis.  Last refilled 03/03/24 for #12 with no refills.  Vit D level ??  Next appt: No future appointments with PCP.

## 2024-05-26 ENCOUNTER — Encounter: Payer: Self-pay | Admitting: Family Medicine

## 2024-05-26 DIAGNOSIS — E559 Vitamin D deficiency, unspecified: Secondary | ICD-10-CM | POA: Insufficient documentation

## 2024-05-26 NOTE — Telephone Encounter (Signed)
 Call pt.. at next lab visit we need to check vit D level.. unless done elsewhere. ast checked 2023 per our chart.

## 2024-05-28 ENCOUNTER — Telehealth (INDEPENDENT_AMBULATORY_CARE_PROVIDER_SITE_OTHER): Payer: Self-pay | Admitting: Vascular Surgery

## 2024-05-28 ENCOUNTER — Telehealth (INDEPENDENT_AMBULATORY_CARE_PROVIDER_SITE_OTHER): Payer: Self-pay | Admitting: *Deleted

## 2024-05-28 ENCOUNTER — Other Ambulatory Visit: Payer: Self-pay

## 2024-05-28 DIAGNOSIS — E611 Iron deficiency: Secondary | ICD-10-CM

## 2024-05-28 NOTE — Telephone Encounter (Signed)
 Spoke to pt who states she is in a meeting and will CB to schedule Laser Ablation after.

## 2024-05-28 NOTE — Telephone Encounter (Signed)
 Error

## 2024-05-29 ENCOUNTER — Inpatient Hospital Stay: Attending: Oncology

## 2024-05-29 ENCOUNTER — Inpatient Hospital Stay (HOSPITAL_BASED_OUTPATIENT_CLINIC_OR_DEPARTMENT_OTHER): Admitting: Oncology

## 2024-05-29 ENCOUNTER — Encounter: Payer: Self-pay | Admitting: Oncology

## 2024-05-29 VITALS — BP 130/70 | HR 77 | Temp 98.0°F | Resp 18 | Ht 66.0 in | Wt 273.3 lb

## 2024-05-29 DIAGNOSIS — E611 Iron deficiency: Secondary | ICD-10-CM

## 2024-05-29 DIAGNOSIS — Z79899 Other long term (current) drug therapy: Secondary | ICD-10-CM | POA: Insufficient documentation

## 2024-05-29 DIAGNOSIS — N2889 Other specified disorders of kidney and ureter: Secondary | ICD-10-CM

## 2024-05-29 DIAGNOSIS — Z8639 Personal history of other endocrine, nutritional and metabolic disease: Secondary | ICD-10-CM

## 2024-05-29 DIAGNOSIS — Z7982 Long term (current) use of aspirin: Secondary | ICD-10-CM | POA: Diagnosis not present

## 2024-05-29 DIAGNOSIS — D66 Hereditary factor VIII deficiency: Secondary | ICD-10-CM | POA: Insufficient documentation

## 2024-05-29 DIAGNOSIS — D509 Iron deficiency anemia, unspecified: Secondary | ICD-10-CM | POA: Diagnosis present

## 2024-05-29 LAB — IRON AND TIBC
Iron: 72 ug/dL (ref 28–170)
Saturation Ratios: 20 % (ref 10.4–31.8)
TIBC: 357 ug/dL (ref 250–450)
UIBC: 285 ug/dL

## 2024-05-29 LAB — CBC (CANCER CENTER ONLY)
HCT: 39.8 % (ref 36.0–46.0)
Hemoglobin: 13.1 g/dL (ref 12.0–15.0)
MCH: 30.9 pg (ref 26.0–34.0)
MCHC: 32.9 g/dL (ref 30.0–36.0)
MCV: 93.9 fL (ref 80.0–100.0)
Platelet Count: 226 K/uL (ref 150–400)
RBC: 4.24 MIL/uL (ref 3.87–5.11)
RDW: 12.5 % (ref 11.5–15.5)
WBC Count: 5.3 K/uL (ref 4.0–10.5)
nRBC: 0 % (ref 0.0–0.2)

## 2024-05-29 LAB — FERRITIN: Ferritin: 242 ng/mL (ref 11–307)

## 2024-05-29 NOTE — Progress Notes (Signed)
 Patient states she went to the ER due to a-fib; they did a CT Scan & found a renal mass. She would like insight on next steps.

## 2024-05-29 NOTE — Progress Notes (Signed)
 "    Hematology/Oncology Consult note [facility]  [phone] [phone]  Patient Care Team: Avelina Greig BRAVO, MD as PCP - General (Family Medicine) Darliss Rogue, MD as PCP - Cardiology (Cardiology) Cindie Ole DASEN, MD as PCP - Electrophysiology (Cardiology) Heniford, Francis, MD as Referring Physician (Surgery) Tamea Dedra CROME, MD as Consulting Physician (Pulmonary Disease) Eletha Boas, MD as Consulting Physician (General Surgery) Jaye Fallow, MD as Referring Physician (Ophthalmology) Melanee Annah BROCKS, MD as Consulting Physician (Oncology)   Name of the patient: Kimberly Patton  [id number]  March 01, 1956   Date of visit: 05/29/2024  Diagnosis-iron  deficiency anemia  Chief complaint/ Reason for visit-routine follow-up of iron  deficiency anemia  Heme/Onc history:  patient is a 68 year old female with a past medical history significant for hypertension, asthma, osteoporosis, laparoscopic sleeve streptokinase surgery in December 2014 referred for iron  deficiency anemiaCBC from 05/15/2023 showed white cell count of 6.7, H&H of 12.6/38.9 with a platelet count of 315.  B12 levels were normal at 1057.  Iron  studies showed a low ferritin of 5 with an iron  saturation of 7% and elevated TIBC which has been a chronic issue for her and her ferritin levels have been low at least dating back to July 2021.She had an upper endoscopy by Dr. Maryruth in May 2024 which showed 3 cm hiatal hernia.  LA grade a reflux esophagitis without bleeding and sleeve gastrectomy with healthy-appearing mucosa.  Last colonoscopy was in November 2022 by Dr. Therisa which showed 2 polyps in the transverse colon and 1 in the ascending colon which were resected and were negative for malignancy.  She also has a prior history of complex sclerosing lesion status post right lumpectomy in January 2023 which showed fibrocystic changes with epithelial hyperplasia without atypia and negative  for carcinoma   She received IV iron  in January 2025  Interval history-  History of Present Illness   Kimberly Patton is a 68 year old female with iron  deficiency and a centrally located renal mass concerning for renal cell carcinoma who presents for hematology/oncology follow-up regarding anemia management and recent renal mass diagnosis.  She received intravenous iron  in September after a ferritin level of 30 was identified, despite the absence of anemia at that time. Ferritin and iron  studies were repeated today, with results pending. She has not experienced symptoms of anemia and denies evidence of gastrointestinal bleeding. Colonoscopy in November was unremarkable, with no polyps or other abnormalities identified.  A centrally located renal mass was incidentally discovered on CT scan during an emergency room evaluation for new-onset atrial fibrillation. The mass is radiographically concerning for renal cell carcinoma, with possible vascular involvement. Urinalysis at the time of discovery did not reveal hematuria. She denies abdominal pain and has no symptoms attributable to the renal mass. She has a known cyst on the contralateral kidney and a history of ovarian cysts, but the current mass is solid and distinct from prior cystic lesions. She has a history of multiple abdominal surgeries and significant intra-abdominal adhesions, which may impact surgical planning.  She expressed significant emotional distress upon receiving the diagnosis and has sought clarification regarding the need for nephrectomy, the possibility of alternative therapies such as cryoablation, and the utility of additional imaging or tumor markers.      ECOG PS- 1 Pain scale- 0   Review of systems- Review of Systems  Constitutional:  Negative for chills, fever, malaise/fatigue and weight loss.  HENT:  Negative for congestion, ear discharge and nosebleeds.   Eyes:  Negative for blurred vision.  Respiratory:  Negative  for cough, hemoptysis, sputum production, shortness of breath and wheezing.   Cardiovascular:  Negative for chest pain, palpitations, orthopnea and claudication.  Gastrointestinal:  Negative for abdominal pain, blood in stool, constipation, diarrhea, heartburn, melena, nausea and vomiting.  Genitourinary:  Negative for dysuria, flank pain, frequency, hematuria and urgency.  Musculoskeletal:  Negative for back pain, joint pain and myalgias.  Skin:  Negative for rash.  Neurological:  Negative for dizziness, tingling, focal weakness, seizures, weakness and headaches.  Endo/Heme/Allergies:  Does not bruise/bleed easily.  Psychiatric/Behavioral:  Negative for depression and suicidal ideas. The patient does not have insomnia.       Allergies[1]   Past Medical History:  Diagnosis Date   ALLERGIC RHINITIS 04/03/2007   Allergy    Not sure   Anemia    ((Pt Qnr Sub: Denies at visit from 08/28/2021 with endocrinology))    Anginal pain    Anxiety    Arthritis Not sure   Asthma    hx of years ago - no longer a problem    ASTHMA, PERSISTENT, MODERATE 04/03/2007   Back pain    Cataract    Chest pain    Depression screen 01/08/2023   Dyspnea    with exertion   Dyspnea on exertion    a. 11/2007 Echo: EF 60%.   Dysrhythmia    hx of heart arrhythmia 10-15 years ago - followed by DR Waddell - not seen in years    Empty sella syndrome    GERD (gastroesophageal reflux disease)    Headache    Hemophilia carrier    Hiatal hernia    History of kidney stones    Hypercholesteremia 06/09/2019   Hypertension    Joint pain    HIPS / LEGS   Joint pain    Knee injury    RT   Lymph edema    bilateral legs   Meningioma (HCC)    Morbid obesity (HCC)    Neuromuscular disorder (HCC) Not sure, since my fall   Occipital neuralgia    Osteoarthritis    Palpitations    Paroxysmal SVT (supraventricular tachycardia)    a. 11/2011 48h Holter: RSR, rare PVC's, occas PAC's   Retinal tear of right eye  01/2016   Skin cancer    SOB (shortness of breath)    Thyroid  nodule 04/15/2018   Ventral hernia      Past Surgical History:  Procedure Laterality Date   ABDOMINAL HYSTERECTOMY  Think 1994   APPENDECTOMY  1996   ARTERY BIOPSY Left 03/21/2022   Procedure: BIOPSY TEMPORAL ARTERY;  Surgeon: Jama Cordella MATSU, MD;  Location: [facility] ORS;  Service: Vascular;  Laterality: Left;   BIOPSY THYROID      08/2021   BREAST CYST EXCISION Right 1994   Benign   CARDIOVERSION N/A 11/12/2022   Procedure: CARDIOVERSION;  Surgeon: Perla Evalene PARAS, MD;  Location: [facility];  Service: Cardiovascular;  Laterality: N/A;   CESAREAN SECTION     x2   CHOLECYSTECTOMY  1995   COLONOSCOPY N/A 04/16/2024   Procedure: COLONOSCOPY;  Surgeon: Therisa Bi, MD;  Location: [facility] ENDOSCOPY;  Service: Gastroenterology;  Laterality: N/A;  DM/Eliquis  Early morning appointment   COLONOSCOPY WITH PROPOFOL  N/A 05/09/2021   Procedure: COLONOSCOPY WITH PROPOFOL ;  Surgeon: Therisa Bi, MD;  Location: [facility] ENDOSCOPY;  Service: Gastroenterology;  Laterality: N/A;   ESOPHAGOGASTRODUODENOSCOPY (EGD) WITH PROPOFOL  N/A 11/02/2022   Procedure: ESOPHAGOGASTRODUODENOSCOPY (EGD) WITH PROPOFOL ;  Surgeon: Maryruth Ole DASEN,  MD;  Location: [facility] ENDOSCOPY;  Service: Endoscopy;  Laterality: N/A;   EYE SURGERY  01/2016   Repair retinal tear   FOOT SURGERY  1995 / 1996   x2   HERNIA REPAIR     KNEE ARTHROSCOPY W/ MENISCAL REPAIR  07/25/2013   right knee Dr. Josefina   LAPAROSCOPIC GASTRIC SLEEVE RESECTION N/A 06/08/2013   Procedure: LAPAROSCOPIC GASTRIC SLEEVE RESECTION AND EXCISION OF SEBACEUS CYST FROM MID CHEST takedown of incarcerated ventral hernia and primary repair endoscopy;  Surgeon: Donnice KATHEE Lunger, MD;  Location: [facility]S;  Service: General;  Laterality: N/A;   LAPAROSCOPIC NISSEN FUNDOPLICATION N/A 02/02/2020   Procedure: LAPAROSCOPIC REPAIR LARGE SYMPTOMATIC HIATAL HERNIA WITH UPPER ENDOSCOPY;  Surgeon: Lunger Donnice, MD;  Location:  [facility] ORS;  Service: General;  Laterality: N/A;   moles  06/2013   removed 2 moles from under arm and  lowback   NECK SURGERY     occipital nerve damage- injections    OVARIAN CYST REMOVAL  1970   PILONIDAL CYST EXCISION  1975   RADIOACTIVE SEED GUIDED EXCISIONAL BREAST BIOPSY Right 06/23/2021   Procedure: RADIOACTIVE SEED GUIDED EXCISIONAL RIGHT BREAST BIOPSY;  Surgeon: Lunger Donnice, MD;  Location: [facility];  Service: General;  Laterality: Right;   RETINAL TEAR REPAIR CRYOTHERAPY Right 01/2016   [place]   SKIN CANCER EXCISION     TEE WITHOUT CARDIOVERSION N/A 11/12/2022   Procedure: TRANSESOPHAGEAL ECHOCARDIOGRAM;  Surgeon: Perla Evalene PARAS, MD;  Location: [facility] ORS;  Service: Cardiovascular;  Laterality: N/A;   THYROID  LOBECTOMY Left 08/21/2022   Procedure: LEFT THYROID  LOBECTOMY;  Surgeon: Eletha Boas, MD;  Location: [facility];  Service: General;  Laterality: Left;   THYROIDECTOMY, PARTIAL     TONSILLECTOMY     TUBAL LIGATION  Think 1994   UPPER GI ENDOSCOPY  06/08/2013   Procedure: UPPER GI ENDOSCOPY;  Surgeon: Donnice KATHEE Lunger, MD;  Location: [facility];  Service: General;;   US  ECHOCARDIOGRAPHY  12/2011   WNL - EF 55-60%, mild MR, grade 1 diastolic dysnfiction (mild)   VENTRAL HERNIA REPAIR  2000    Social History   Socioeconomic History   Marital status: Divorced    Spouse name: Not on file   Number of children: 2   Years of education: 17   Highest education level: Bachelor's degree (e.g., BA, AB, BS)  Occupational History   Occupation: Magazine Features Editor: [employer]   Occupation: Retired Runner, Broadcasting/film/video  Tobacco Use   Smoking status: Never   Smokeless tobacco: Never   Tobacco comments:    smoke at age [age]  Vaping Use   Vaping status: Never Used  Substance and Sexual Activity   Alcohol use: Not Currently   Drug use: Never   Sexual activity: Not Currently    Partners: Male    Birth control/protection: Abstinence  Other Topics Concern   Not on  file  Social History Narrative   Lives at home alone.   Right-handed.   Occasional caffeine use.   Social Drivers of Health   Tobacco Use: Low Risk (05/29/2024)   Patient History    Smoking Tobacco Use: Never    Smokeless Tobacco Use: Never    Passive Exposure: Not on file  Financial Resource Strain: Low Risk (03/21/2024)   Overall Financial Resource Strain (CARDIA)    Difficulty of Paying Living Expenses: Not hard at all  Food Insecurity: No Food Insecurity (05/13/2024)   Epic    Worried About Running Out  of Food in the Last Year: Never true    Ran Out of Food in the Last Year: Never true  Transportation Needs: No Transportation Needs (05/13/2024)   Epic    Lack of Transportation (Medical): No    Lack of Transportation (Non-Medical): No  Physical Activity: Inactive (05/13/2024)   Exercise Vital Sign    Days of Exercise per Week: 0 days    Minutes of Exercise per Session: 10 min  Stress: No Stress Concern Present (05/13/2024)   [employer] of Occupational Health - Occupational Stress Questionnaire    Feeling of Stress: Not at all  Social Connections: Moderately Integrated (05/13/2024)   Social Connection and Isolation Panel    Frequency of Communication with Friends and Family: More than three times a week    Frequency of Social Gatherings with Friends and Family: Three times a week    Attends Religious Services: More than 4 times per year    Active Member of Clubs or Organizations: Yes    Attends Banker Meetings: More than 4 times per year    Marital Status: Divorced  Intimate Partner Violence: Not At Risk (05/13/2024)   Epic    Fear of Current or Ex-Partner: No    Emotionally Abused: No    Physically Abused: No    Sexually Abused: No  Depression (PHQ2-9): Low Risk (05/29/2024)   Depression (PHQ2-9)    PHQ-2 Score: 0  Alcohol Screen: Low Risk (05/08/2022)   Alcohol Screen    Last Alcohol Screening Score (AUDIT): 0  Housing: Low Risk (05/13/2024)    Epic    Unable to Pay for Housing in the Last Year: No    Number of Times Moved in the Last Year: 0    Homeless in the Last Year: No  Utilities: Not At Risk (05/13/2024)   Epic    Threatened with loss of utilities: No  Health Literacy: Adequate Health Literacy (05/13/2024)   B1300 Health Literacy    Frequency of need for help with medical instructions: Never    Family History  Problem Relation Age of Onset   Breast cancer Mother    Diabetes Mother    Hypertension Mother    Kidney failure Mother    Heart disease Mother    Obesity Mother    Cancer Mother    Arthritis Mother    Diabetes Father    Hypertension Father    Heart disease Father    Diabetes Other    Hypertension Other    Stroke Other    Hemophilia Other    Kidney disease Sister    Arthritis Sister    Obesity Sister    Diabetes Brother    Obesity Brother    Varicose Veins Sister     Current Medications[2]  Physical exam:  Vitals:   05/29/24 0937  BP: 130/70  Pulse: 77  Resp: 18  Temp: 98 F (36.7 C)  TempSrc: Tympanic  SpO2: 100%  Weight: 273 lb 4.8 oz (124 kg)  Height: 5' 6 (1.676 m)   Physical Exam Cardiovascular:     Rate and Rhythm: Normal rate and regular rhythm.     Heart sounds: Normal heart sounds.  Pulmonary:     Effort: Pulmonary effort is normal.     Breath sounds: Normal breath sounds.  Skin:    General: Skin is warm and dry.  Neurological:     Mental Status: She is alert and oriented to person, place, and time.      I  have personally reviewed labs listed below:    Latest Ref Rng & Units 05/01/2024   10:03 PM  CMP  Glucose 70 - 99 mg/dL 898   BUN 8 - 23 mg/dL 27   Creatinine 9.55 - 1.00 mg/dL 9.48   Sodium 864 - 854 mmol/L 139   Potassium 3.5 - 5.1 mmol/L 3.8   Chloride 98 - 111 mmol/L 103   CO2 22 - 32 mmol/L 26   Calcium  8.9 - 10.3 mg/dL 9.5       Latest Ref Rng & Units 05/29/2024    9:19 AM  CBC  WBC 4.0 - 10.5 K/uL 5.3   Hemoglobin 12.0 - 15.0 g/dL 86.8    Hematocrit 63.9 - 46.0 % 39.8   Platelets 150 - 400 K/uL 226    I have personally reviewed Radiology images listed below: No images are attached to the encounter.  CT ABDOMEN PELVIS W CONTRAST Result Date: 05/01/2024 EXAM: CT ABDOMEN AND PELVIS WITH CONTRAST 05/01/2024 11:25:52 PM TECHNIQUE: CT of the abdomen and pelvis was performed with the administration of intravenous contrast. Multiplanar reformatted images are provided for review. Automated exposure control, iterative reconstruction, and/or weight-based adjustment of the mA/kV was utilized to reduce the radiation dose to as low as reasonably achievable. 100 mL of iohexol  (OMNIPAQUE ) 350 MG/ML injection was administered. COMPARISON: Comparison study 10/17/2020. Clinical history of the prior study was left-sided abdominal pain. CLINICAL HISTORY: LLQ abdominal pain. FINDINGS: LOWER CHEST: Lung bases are free of acute infiltrate or sizable effusion. No parenchymal nodules are seen. LIVER: The liver is within normal limits. GALLBLADDER AND BILE DUCTS: The gallbladder has been surgically removed. No biliary ductal dilatation. SPLEEN: The spleen is within normal limits. PANCREAS: The pancreas is within normal limits. ADRENAL GLANDS: Adrenal glands are unremarkable. KIDNEYS, URETERS AND BLADDER: Kidneys are well visualized. The left kidney again demonstrates a large simple-appearing cyst measuring 4.1 cm, stable in appearance from the prior exam. No follow-up is recommended. The right kidney demonstrates an irregularly enhancing mass lesion which measures up to 3.9 cm, most consistent with a renal cell carcinoma. No stones in the kidneys or ureters. The bladder is within normal limits. GI AND BOWEL: Small bowel is within normal limits. Stomach demonstrates a hiatal hernia and postsurgical changes consistent with prior sleeve resection. The appendix is not visualized consistent with the prior surgical history. No obstructive or inflammatory changes of the  colon are seen. Mild diverticular change is noted. PERITONEUM AND RETROPERITONEUM: No free fluid is seen. No free air. VASCULATURE: Atherosclerotic calcifications of the aorta are noted. LYMPH NODES: No lymphadenopathy. REPRODUCTIVE ORGANS: The uterus demonstrates multiple calcified fibroids stable in appearance from the previous exam. BONES AND SOFT TISSUES: No acute osseous abnormality. IMPRESSION: 1. Irregularly enhancing right renal mass measuring up to 3.9 cm, suspicious for renal cell carcinoma; recommend prompt non emergent renal protocol MRI or CT without and with contrast and urology consultation. 2. Mild diverticular change without evidence of diverticulitis. Electronically signed by: Oneil Devonshire MD 05/01/2024 11:38 PM EST RP Workstation: [id number]     Assessment and plan- Patient is a 68 y.o. female here for a routine follow-up of iron  deficiency anemia  Assessment and Plan    Iron  deficiency Iron  deficiency with previously low ferritin, non-anemic at initial supplementation. Prior colonoscopy unremarkable. Recurrence may warrant further gastrointestinal evaluation, especially post-nephrectomy. - Monitored ferritin and iron  levels; pending results. - Review iron  studies to determine need for further supplementation. - Monitor iron  levels every three months. - Refer  to gastroenterology if deficiency recurs. - Provided guidance on potential postoperative deficiency and need for earlier blood work post-nephrectomy. - Advised to request earlier blood work if desired.  Renal mass, possible renal cell carcinoma Centrally located renal mass on CT concerning for renal cell carcinoma with possible vascular involvement. Biopsy not routinely performed due to bleeding risk and limited yield. MRI may be considered by [facility] urology team. Multiple prior surgeries and adhesions may impact surgical planning. Nephrectomy recommended, cryoablation as alternative based on tumor characteristics. - Reviewed  urology and imaging findings. - Confirmed upcoming evaluation at [facility] for management and surgical planning. - Discussed biopsy not routinely performed due to bleeding risk and limited utility. - Discussed MRI consideration by [facility] team for further characterization. - Advised follow-up with [facility] for nephrectomy or alternative therapies based on tumor size and location.      Visit Diagnosis 1. Iron  deficiency      Dr. Annah Skene, MD, MPH [facility] [phone] 05/29/2024 12:43 PM                   [1]  Allergies Allergen Reactions   Wasp Venom Anaphylaxis   Chlorhexidine  Gluconate     Itching under breasts   Metronidazole Other (See Comments)    Chest tightness, neck tightness   Prednisone  Other (See Comments)    Severe migraine / with taper dose Told by Cardiologist to avoid due to Afib   Other Rash    Bleach   Sodium Hypochlorite Rash    Liquid clorox bleach  [2]  Current Outpatient Medications:    aspirin  EC 81 MG tablet, Take 81 mg by mouth daily. Swallow whole., Disp: , Rfl:    Calcium  Citrate-Vitamin D  (CITRACAL + D PO), Take 2 tablets by mouth daily. , Disp: , Rfl:    Cholecalciferol (VITAMIN D3) 1.25 MG (50000 UT) CAPS, TAKE 1 CAPSULE BY MOUTH ONE TIME PER WEEK, Disp: 12 capsule, Rfl: 0   Coenzyme Q10-Vitamin E  (QUNOL ULTRA COQ10) 100-150 MG-UNIT CAPS, Take 100 mg by mouth daily., Disp: , Rfl:    Cyanocobalamin  (VITAMIN B-12) 2500 MCG SUBL, Place 2,500 mcg under the tongue daily., Disp: , Rfl:    diltiazem  (CARDIZEM ) 30 MG tablet, Take 30 mg by mouth 2 (two) times daily as needed., Disp: , Rfl:    esomeprazole (NEXIUM) 20 MG capsule, Take 20 mg by mouth daily at 12 noon., Disp: , Rfl:    Lactobacillus Rhamnosus, GG, (CULTURELLE IMMUNITY SUPPORT PO), Take 1 capsule by mouth daily., Disp: , Rfl:    levalbuterol  (XOPENEX  HFA) 45 MCG/ACT inhaler, Inhale 2 puffs into the lungs every 6 (six) hours as needed for wheezing., Disp: 1  each, Rfl: 2   Menaquinone-7 (VITAMIN K2  PO), Take 100 mg by mouth daily., Disp: , Rfl:    Multiple Vitamins-Minerals (CENTRUM SILVER 50+WOMEN PO), Take 1 tablet by mouth daily., Disp: , Rfl:    nitroGLYCERIN  (NITROSTAT ) 0.4 MG SL tablet, Place 1 tablet (0.4 mg total) under the tongue every 5 (five) minutes as needed for chest pain., Disp: 25 tablet, Rfl: 3   Riboflavin  400 MG CAPS, Take 400 mg by mouth daily., Disp: , Rfl:    rosuvastatin  (CRESTOR ) 10 MG tablet, Take 1 tablet (10 mg total) by mouth daily., Disp: 90 tablet, Rfl: 3   spironolactone  (ALDACTONE ) 25 MG tablet, Take 1 tablet (25 mg total) by mouth daily., Disp: 90 tablet, Rfl: 3   TURMERIC PO, Take 2 tablets by mouth daily.  500 mg  With Ginger 50 mg Gummies, Disp: , Rfl:    verapamil  (CALAN -SR) 120 MG CR tablet, TAKE 1 TABLET BY MOUTH 2 TIMES DAILY., Disp: 180 tablet, Rfl: 2   furosemide  (LASIX ) 20 MG tablet, Take 1 tablet (20 mg total) by mouth daily as needed. (Patient not taking: Reported on 05/20/2024), Disp: 90 tablet, Rfl: 3   traMADol  (ULTRAM ) 50 MG tablet, Take 1 tablet (50 mg total) by mouth every 6 (six) hours as needed. (Patient not taking: Reported on 05/29/2024), Disp: 12 tablet, Rfl: 0 No current facility-administered medications for this visit.  Facility-Administered Medications Ordered in Other Visits:    albuterol  (PROVENTIL ) (2.5 MG/3ML) 0.083% nebulizer solution 2.5 mg, 2.5 mg, Nebulization, Once, Tamea Dedra CROME, MD  "

## 2024-05-31 ENCOUNTER — Other Ambulatory Visit: Payer: Self-pay | Admitting: Cardiology

## 2024-06-01 ENCOUNTER — Encounter: Payer: Self-pay | Admitting: Cardiology

## 2024-06-03 ENCOUNTER — Ambulatory Visit: Payer: Self-pay

## 2024-06-03 ENCOUNTER — Ambulatory Visit

## 2024-06-03 VITALS — BP 116/74 | HR 82 | Temp 98.7°F | Ht 66.0 in | Wt 272.0 lb

## 2024-06-03 DIAGNOSIS — R051 Acute cough: Secondary | ICD-10-CM

## 2024-06-03 LAB — POCT INFLUENZA A/B
Influenza A, POC: NEGATIVE
Influenza B, POC: NEGATIVE

## 2024-06-03 LAB — POC COVID19 BINAXNOW: SARS Coronavirus 2 Ag: NEGATIVE

## 2024-06-03 MED ORDER — PROMETHAZINE-DM 6.25-15 MG/5ML PO SYRP: 5.0000 mL | ORAL_SOLUTION | Freq: Every evening | ORAL | 0 refills | Status: AC

## 2024-06-03 MED ORDER — OXYMETAZOLINE HCL 0.05 % NA SOLN: 1.0000 | Freq: Two times a day (BID) | NASAL | 0 refills | Status: AC

## 2024-06-03 NOTE — Telephone Encounter (Signed)
 Next Appt With Family Medicine Tereasa KATHEE Roys, FNP) 06/05/2024 at 9:00 AM

## 2024-06-03 NOTE — Telephone Encounter (Signed)
" °  FYI Only or Action Required?: FYI only for provider: recommended UC today.  Patient was last seen in primary care on 03/31/2024 by Avelina Greig BRAVO, MD.  Called Nurse Triage reporting Cough, Facial Pain, and Nasal Congestion.  Symptoms began several days ago.  Interventions attempted: OTC medications: coricidin HBP.  Symptoms are: unchanged.  Triage Disposition: See HCP Within 4 Hours (Or PCP Triage)  Patient/caregiver understands and will follow disposition?: Yes  Copied from CRM #[id number]. Topic: Clinical - Red Word Triage >> Jun 03, 2024  9:53 AM Victoria A wrote: [facility] that prompted transfer to Nurse Triage: Patient is having body aches, severe coughing that has caused her chest to hurt, headache, face is swollen Reason for Disposition  Wheezing is present  Answer Assessment - Initial Assessment Questions 1. ONSET: When did the cough begin?      Monday, two days ago 2. SEVERITY: How bad is the cough today?      Cough worse over night, but has not been as bad today 3. SPUTUM: Describe the color of your sputum (e.g., none, dry cough; clear, white, yellow, green)     yellow 4. HEMOPTYSIS: Are you coughing up any blood? If Yes, ask: How much? (e.g., flecks, streaks, tablespoons, etc.)     Some flecks from irritation 5. DIFFICULTY BREATHING: Are you having difficulty breathing? If Yes, ask: How bad is it? (e.g., mild, moderate, severe)      denies 6. FEVER: Do you have a fever? If Yes, ask: What is your temperature, how was it measured, and when did it start?     denies 7. CARDIAC HISTORY: Do you have any history of heart disease? (e.g., heart attack, congestive heart failure)      HTN 8. LUNG HISTORY: Do you have any history of lung disease?  (e.g., pulmonary embolus, asthma, emphysema)     asthma  10. OTHER SYMPTOMS: Do you have any other symptoms? (e.g., runny nose, wheezing, chest pain)       Wheezing at times.  Stomach/muscle pain from  cough  Protocols used: Cough - Acute Productive-A-AH  "

## 2024-06-03 NOTE — Patient Instructions (Signed)
 Thank you for visiting [facility] today! Here's what we talked about: - START levalbuterol  scheduled every 4 hours for next 3 days  - Message me if ear pain not improved in 3-4 days

## 2024-06-03 NOTE — Progress Notes (Signed)
 "  Subjective:   This visit was conducted in person. The patient gave informed consent to the use of Abridge AI technology to record the contents of the encounter as documented below.   Patient ID: Kimberly Patton, female    DOB: 1956-01-15, 69 y.o.   MRN: [id number]   Discussed the use of AI scribe software for clinical note transcription with the patient, who gave verbal consent to proceed.  History of Present Illness Kimberly Patton is a 68 year old female with atrial fibrillation who presents with respiratory symptoms and ear pain.  She began experiencing respiratory symptoms two days ago after attending a reception. Initially, she suspected an allergic reaction to perfume, but her symptoms progressed to significant coughing and wheezing. She has been using her levalbuterol  inhaler approximately every six hours, increasing to every four hours due to worsening symptoms. She had not used the inhaler in years prior to this episode.  Her symptoms include a runny nose, right ear pain, and a frontal headache. No chest pain, body aches, or fever, but she has difficulty catching her breath and wheezing, which prompted her to seek medical attention. She also notes that her stomach was affected by the coughing.  Her past medical history includes atrial fibrillation, which is exacerbated by steroid use, and a partial thyroidectomy. She recently had a normal chest x-ray on June 01, 2024, at [facility], where her coughing began. She is scheduled for a kidney biopsy on June 10, 2024, and is concerned about her current illness affecting this procedure.  She is currently using levalbuterol  inhaler as needed, quercetin for chest congestion, and Flonase  for nasal congestion. She uses an incentive spirometer since Monday to aid her breathing. She has used regular over-the-counter medications for her cough.   Review of Systems  All other systems reviewed and are negative.        Allergies[1]  Medications Ordered Prior to Encounter[2]  BP 116/74 (BP Location: Right Arm, Patient Position: Sitting, Cuff Size: Large)   Pulse 82   Temp 98.7 F (37.1 C) (Oral)   Ht 5' 6 (1.676 m)   Wt 272 lb (123.4 kg)   SpO2 95%   BMI 43.90 kg/m   Objective:      Physical Exam VITALS: T- 98.7 GENERAL: Alert, cooperative, well developed, no acute distress. HEAD: Normocephalic atraumatic. EYES: Extraocular movements intact bilaterally, pupils round, equal and reactive to light bilaterally, conjunctivae normal bilaterally. EARS: Erythematous tympanic membrane. NOSE: Tenderness over right maxillary and frontal sinuses. THROAT: No oropharyngeal exudate or posterior oropharyngeal erythema. CARDIOVASCULAR: Normal heart rate and rhythm, S1 and S2 normal without murmurs. CHEST: Diminished airflow, lungs clear to auscultation, no pronounced wheezing. ABDOMEN: Soft, non tender, non distended, without organomegaly, normal bowel sounds. EXTREMITIES: No cyanosis or edema. NEUROLOGICAL: Oriented to person, place and time, no gait abnormalities, moves all extremities without gross motor or sensory deficit.         Assessment & Plan:   Assessment & Plan Acute cough with congestion Likely viral etiology with negative COVID-19 and influenza tests. Erythematous tympanic membrane suggests viral otitis media. Holding off on antibiotics at this time. - Administer levalbuterol  inhaler every four hours for three days, then every six hours or as needed. - Use OTC meds during the day for cough control. - Use promethazine  syrup, 5 mL nightly for cough. - Use Afrin nasal spray, one spray each nostril twice daily for three days. - Monitor ear pain; if no improvement in three to four  days, send MyChart message for potential antibiotics.    Return for worsening of symptoms or failure to improve.   Alroy Portela K Brier Reid, MD  06/03/2024     Contains text generated by Abridge.        [1]   Allergies Allergen Reactions   Wasp Venom Anaphylaxis   Chlorhexidine  Gluconate     Itching under breasts   Metronidazole Other (See Comments)    Chest tightness, neck tightness   Prednisone  Other (See Comments)    Severe migraine / with taper dose Told by Cardiologist to avoid due to Afib   Other Rash    Bleach   Sodium Hypochlorite Rash    Liquid clorox bleach  [2]  Current Outpatient Medications on File Prior to Visit  Medication Sig Dispense Refill   aspirin  EC 81 MG tablet Take 81 mg by mouth daily. Swallow whole.     Calcium  Citrate-Vitamin D  (CITRACAL + D PO) Take 2 tablets by mouth daily.      Cholecalciferol (VITAMIN D3) 1.25 MG (50000 UT) CAPS TAKE 1 CAPSULE BY MOUTH ONE TIME PER WEEK 12 capsule 0   Coenzyme Q10-Vitamin E  (QUNOL ULTRA COQ10) 100-150 MG-UNIT CAPS Take 100 mg by mouth daily.     Cyanocobalamin  (VITAMIN B-12) 2500 MCG SUBL Place 2,500 mcg under the tongue daily.     diltiazem  (CARDIZEM ) 30 MG tablet Take 30 mg by mouth 2 (two) times daily as needed.     esomeprazole (NEXIUM) 20 MG capsule Take 20 mg by mouth daily at 12 noon.     furosemide  (LASIX ) 20 MG tablet Take 1 tablet (20 mg total) by mouth daily as needed. 90 tablet 3   Lactobacillus Rhamnosus, GG, (CULTURELLE IMMUNITY SUPPORT PO) Take 1 capsule by mouth daily.     levalbuterol  (XOPENEX  HFA) 45 MCG/ACT inhaler Inhale 2 puffs into the lungs every 6 (six) hours as needed for wheezing. 1 each 2   Menaquinone-7 (VITAMIN K2  PO) Take 100 mg by mouth daily.     Multiple Vitamins-Minerals (CENTRUM SILVER 50+WOMEN PO) Take 1 tablet by mouth daily.     nitroGLYCERIN  (NITROSTAT ) 0.4 MG SL tablet Place 1 tablet (0.4 mg total) under the tongue every 5 (five) minutes as needed for chest pain. 25 tablet 3   Riboflavin  400 MG CAPS Take 400 mg by mouth daily.     rosuvastatin  (CRESTOR ) 10 MG tablet TAKE 1 TABLET BY MOUTH EVERY DAY 90 tablet 3   spironolactone  (ALDACTONE ) 25 MG tablet Take 1 tablet (25 mg total) by  mouth daily. 90 tablet 3   traMADol  (ULTRAM ) 50 MG tablet Take 1 tablet (50 mg total) by mouth every 6 (six) hours as needed. 12 tablet 0   TURMERIC PO Take 2 tablets by mouth daily. 500 mg  With Ginger 50 mg Gummies     verapamil  (CALAN -SR) 120 MG CR tablet TAKE 1 TABLET BY MOUTH 2 TIMES DAILY. 180 tablet 2   Current Facility-Administered Medications on File Prior to Visit  Medication Dose Route Frequency Provider Last Rate Last Admin   albuterol  (PROVENTIL ) (2.5 MG/3ML) 0.083% nebulizer solution 2.5 mg  2.5 mg Nebulization Once Tamea Dedra CROME, MD       "

## 2024-06-05 ENCOUNTER — Ambulatory Visit: Admitting: Family

## 2024-06-08 ENCOUNTER — Ambulatory Visit: Payer: Self-pay

## 2024-06-09 DIAGNOSIS — J00 Acute nasopharyngitis [common cold]: Secondary | ICD-10-CM

## 2024-06-12 ENCOUNTER — Encounter: Payer: Self-pay | Admitting: Oncology

## 2024-06-18 ENCOUNTER — Other Ambulatory Visit: Payer: Self-pay | Admitting: Family Medicine

## 2024-06-18 DIAGNOSIS — Z1231 Encounter for screening mammogram for malignant neoplasm of breast: Secondary | ICD-10-CM

## 2024-06-19 ENCOUNTER — Other Ambulatory Visit: Payer: Self-pay | Admitting: Family

## 2024-06-19 ENCOUNTER — Encounter: Payer: Self-pay | Admitting: Oncology

## 2024-06-19 MED ORDER — AMOXICILLIN-POT CLAVULANATE 875-125 MG PO TABS: 1.0000 | ORAL_TABLET | Freq: Two times a day (BID) | ORAL | 0 refills | Status: DC

## 2024-06-19 NOTE — Addendum Note (Signed)
 Addended by: CORWIN ANTU on: 06/19/2024 02:51 PM   Modules accepted: Orders

## 2024-07-03 ENCOUNTER — Other Ambulatory Visit: Payer: Self-pay | Admitting: Cardiology

## 2024-07-03 ENCOUNTER — Ambulatory Visit

## 2024-07-06 ENCOUNTER — Encounter: Payer: Self-pay | Admitting: Family Medicine

## 2024-07-08 ENCOUNTER — Encounter: Payer: Self-pay | Admitting: Family Medicine

## 2024-07-08 ENCOUNTER — Ambulatory Visit: Payer: Self-pay | Admitting: Family Medicine

## 2024-07-08 ENCOUNTER — Ambulatory Visit

## 2024-07-08 ENCOUNTER — Ambulatory Visit: Admitting: Family Medicine

## 2024-07-08 ENCOUNTER — Ambulatory Visit (INDEPENDENT_AMBULATORY_CARE_PROVIDER_SITE_OTHER)
Admission: RE | Admit: 2024-07-08 | Discharge: 2024-07-08 | Disposition: A | Source: Ambulatory Visit | Attending: Family Medicine | Admitting: Family Medicine

## 2024-07-08 ENCOUNTER — Ambulatory Visit: Payer: Self-pay | Admitting: *Deleted

## 2024-07-08 VITALS — BP 110/68 | HR 73 | Temp 97.8°F | Ht 66.0 in | Wt 270.0 lb

## 2024-07-08 DIAGNOSIS — R053 Chronic cough: Secondary | ICD-10-CM

## 2024-07-08 MED ORDER — ALBUTEROL SULFATE (2.5 MG/3ML) 0.083% IN NEBU
2.5000 mg | INHALATION_SOLUTION | Freq: Once | RESPIRATORY_TRACT | Status: AC
  Administered 2024-07-08: 2.5 mg via RESPIRATORY_TRACT

## 2024-07-08 MED ORDER — ALBUTEROL SULFATE (2.5 MG/3ML) 0.083% IN NEBU: 2.5000 mg | INHALATION_SOLUTION | Freq: Four times a day (QID) | RESPIRATORY_TRACT | 1 refills | Status: AC | PRN

## 2024-07-08 NOTE — Telephone Encounter (Signed)
" ° ° °  FYI Only or Action Required?: FYI only for provider: appointment scheduled on 07/08/24.  Patient was last seen in primary care on 06/03/2024 by Bennett Reuben POUR, MD.  Called Nurse Triage reporting Cough. wheezing  Symptoms began several days ago.  Interventions attempted: Rest, hydration, or home remedies.  Symptoms are: gradually worsening.  Triage Disposition: See HCP Within 4 Hours (Or PCP Triage)  Patient/caregiver understands and will follow disposition?: Yes    Patient concerned regarding surgery scheduled for Friday and patient unsure if it will be recommended to do surgery due to sx. Please advise.              Reason for Disposition  Wheezing is present  Answer Assessment - Initial Assessment Questions Appt scheduled today with PCP. Recommended if sx worsen go to ED.        1. ONSET: When did the cough begin?      3 days ago  2. SEVERITY: How bad is the cough today?      Dry cough constant  3. SPUTUM: Describe the color of your sputum (e.g., none, dry cough; clear, white, yellow, green)     white  5. DIFFICULTY BREATHING: Are you having difficulty breathing? If Yes, ask: How bad is it? (e.g., mild, moderate, severe)      denies 6. FEVER: Do you have a fever? If Yes, ask: What is your temperature, how was it measured, and when did it start?     no 7. CARDIAC HISTORY: Do you have any history of heart disease? (e.g., heart attack, congestive heart failure)      na 8. LUNG HISTORY: Do you have any history of lung disease?  (e.g., pulmonary embolus, asthma, emphysema)     Hx asthma  9. PE RISK FACTORS: Do you have a history of blood clots? (or: recent major surgery, recent prolonged travel, bedridden)     Na  10. OTHER SYMPTOMS: Do you have any other symptoms? (e.g., runny nose, wheezing, chest pain)       Cough white mucus, wheezing no chest pain no difficulty breathing no fever reported. No rapid heart rate 11. PREGNANCY: Is  there any chance you are pregnant? When was your last menstrual period?       Na  12. TRAVEL: Have you traveled out of the country in the last month? (e.g., travel history, exposures)       Na  Protocols used: Cough - Acute Productive-A-AH  "

## 2024-07-08 NOTE — Telephone Encounter (Signed)
 Patient sent a MyChart message stating she was going to Urgent Care to be seen at 10:30 am.

## 2024-07-08 NOTE — Progress Notes (Unsigned)
 "   Patient ID: Kimberly Patton, female    DOB: Mar 04, 1956, 69 y.o.   MRN: [id number]  This visit was conducted in person.  BP 110/68   Pulse 73   Temp 97.8 F (36.6 C) (Oral)   Ht 5' 6 (1.676 m)   Wt 270 lb (122.5 kg) Comment: as per patient 270lb pt refused weight.  SpO2 98%   BMI 43.58 kg/m    CC:  Chief Complaint  Patient presents with   Medical Management of Chronic Issues    Pt here for Coughing started back in December. Pt states she has been Wheezing that started last night. Pt states she is coughing up clear mucus    Subjective:   HPI: Kimberly Patton is a 69 y.o. female presenting on 07/08/2024 for Medical Management of Chronic Issues (Pt here for Coughing started back in December. Pt states she has been Wheezing that started last night. Pt states she is coughing up clear mucus)  She has noted ongoing cough since prior to Christmas.  Now ongoing 2 months.  Negative initial COVID and flu test. Associated with wheezing. Initially seen by Dr. Bennett June 03, 2024 treated as viral upper respiratory infection.  Gradually improving but   regular cough lingering.. felt well.  NML CXR in 05/2025.  Started Augmentin .. started in last few days.   Now in last 24 hours worsening constant productive cough, clear mucus and frequent wheeze and SOB.  Cough keeping her up at night.   No fever.  No ear pain, no Sinus pain.  No sinus pain, no ear pain.  Feels well overall.  Using albuterol  inhaler ever 3-4 hours.. helps temporarily.  ( Cannot take prednisone  given trigger  Has upcoming appointment for kidney surgery at [facility] February 27 for removal of right renal mass... but has been now moved forward to 1/30 if cleared.   She has a history of mild intermittent asthma     She has started back on Singulair .   Relevant past medical, surgical, family and social history reviewed and updated as indicated. Interim medical history since our last visit reviewed. Allergies and  medications reviewed and updated. Outpatient Medications Prior to Visit  Medication Sig Dispense Refill   amoxicillin -clavulanate (AUGMENTIN ) 875-125 MG tablet Take 1 tablet by mouth 2 (two) times daily.     aspirin  EC 81 MG tablet Take 81 mg by mouth daily. Swallow whole.     Calcium  Citrate-Vitamin D  (CITRACAL + D PO) Take 2 tablets by mouth daily.      Cholecalciferol (VITAMIN D3) 1.25 MG (50000 UT) CAPS TAKE 1 CAPSULE BY MOUTH ONE TIME PER WEEK 12 capsule 0   Coenzyme Q10-Vitamin E  (QUNOL ULTRA COQ10) 100-150 MG-UNIT CAPS Take 100 mg by mouth daily.     Cyanocobalamin  (VITAMIN B-12) 2500 MCG SUBL Place 2,500 mcg under the tongue daily.     diltiazem  (CARDIZEM ) 30 MG tablet Take 30 mg by mouth 2 (two) times daily as needed.     esomeprazole (NEXIUM) 20 MG capsule Take 20 mg by mouth daily at 12 noon.     Lactobacillus Rhamnosus, GG, (CULTURELLE IMMUNITY SUPPORT PO) Take 1 capsule by mouth daily.     levalbuterol  (XOPENEX  HFA) 45 MCG/ACT inhaler Inhale 2 puffs into the lungs every 6 (six) hours as needed for wheezing. 1 each 2   Menaquinone-7 (VITAMIN K2  PO) Take 100 mg by mouth daily.     Multiple Vitamins-Minerals (CENTRUM SILVER 50+WOMEN PO) Take 1 tablet by  mouth daily.     nitroGLYCERIN  (NITROSTAT ) 0.4 MG SL tablet Place 1 tablet (0.4 mg total) under the tongue every 5 (five) minutes as needed for chest pain. 25 tablet 3   Riboflavin  400 MG CAPS Take 400 mg by mouth daily.     rosuvastatin  (CRESTOR ) 10 MG tablet TAKE 1 TABLET BY MOUTH EVERY DAY 90 tablet 3   spironolactone  (ALDACTONE ) 25 MG tablet TAKE 1 TABLET (25 MG TOTAL) BY MOUTH DAILY. 90 tablet 3   traMADol  (ULTRAM ) 50 MG tablet Take 1 tablet (50 mg total) by mouth every 6 (six) hours as needed. 12 tablet 0   TURMERIC PO Take 2 tablets by mouth daily. 500 mg  With Ginger 50 mg Gummies     verapamil  (CALAN -SR) 120 MG CR tablet TAKE 1 TABLET BY MOUTH 2 TIMES DAILY. 180 tablet 2   furosemide  (LASIX ) 20 MG tablet Take 1 tablet (20 mg  total) by mouth daily as needed. (Patient not taking: Reported on 07/08/2024) 90 tablet 3   Facility-Administered Medications Prior to Visit  Medication Dose Route Frequency Provider Last Rate Last Admin   albuterol  (PROVENTIL ) (2.5 MG/3ML) 0.083% nebulizer solution 2.5 mg  2.5 mg Nebulization Once Tamea Dedra CROME, MD         Per HPI unless specifically indicated in ROS section below Review of Systems Objective:  BP 110/68   Pulse 73   Temp 97.8 F (36.6 C) (Oral)   Ht 5' 6 (1.676 m)   Wt 270 lb (122.5 kg) Comment: as per patient 270lb pt refused weight.  SpO2 98%   BMI 43.58 kg/m   Wt Readings from Last 3 Encounters:  07/08/24 270 lb (122.5 kg)  06/03/24 272 lb (123.4 kg)  05/29/24 273 lb 4.8 oz (124 kg)      Physical Exam    Results for orders placed or performed in visit on 06/03/24  POC COVID-19   Collection Time: 06/03/24 11:50 AM  Result Value Ref Range   SARS Coronavirus 2 Ag Negative Negative  POCT Influenza A/B   Collection Time: 06/03/24 11:51 AM  Result Value Ref Range   Influenza A, POC Negative Negative   Influenza B, POC Negative Negative   *Note: Due to a large number of results and/or encounters for the requested time period, some results have not been displayed. A complete set of results can be found in Results Review.    Assessment and Plan  There are no diagnoses linked to this encounter.  No follow-ups on file.   Greig Ring, MD  "

## 2024-07-08 NOTE — Telephone Encounter (Signed)
 Pt seen in office

## 2024-07-09 ENCOUNTER — Encounter: Payer: Self-pay | Admitting: Family Medicine

## 2024-07-09 LAB — RESPIRATORY VIRUS PANEL
Adenovirus B: NOT DETECTED
HUMAN PARAINFLU VIRUS 1: DETECTED — AB
HUMAN PARAINFLU VIRUS 2: NOT DETECTED
HUMAN PARAINFLU VIRUS 3: NOT DETECTED
Human Parainflu Virus 4: NOT DETECTED
INFLUENZA A SUBTYPE H1: NOT DETECTED
INFLUENZA A SUBTYPE H3: NOT DETECTED
Influenza A: NOT DETECTED
Influenza B: NOT DETECTED
Metapneumovirus: NOT DETECTED
RSV A/B [21029]: NOT DETECTED
Rhinovirus: NOT DETECTED
SARS COV 2: NOT DETECTED

## 2024-07-10 ENCOUNTER — Telehealth: Payer: Self-pay | Admitting: Cardiology

## 2024-07-10 NOTE — Telephone Encounter (Signed)
"  ° °  Pre-operative Risk Assessment    Patient Name: Kimberly Patton  DOB: 06/08/1956 MRN: [id number]   Date of last office visit: 04/20/24 Date of next office visit: TBD   Request for Surgical Clearance    Procedure:  Left nephrectomy  Date of Surgery:  Clearance 07/14/24                                Surgeon:  Dr. Oliva Margo Surgeon's Group or Practice Name:  [facility]  Phone number:  [phone] Fax number:  [phone]   Type of Clearance Requested:   - Medical    Type of Anesthesia:  General    Additional requests/questions:    SignedBernarda JONETTA Fireman   07/10/2024, 1:01 PM   "

## 2024-07-10 NOTE — Telephone Encounter (Signed)
 Dr. Darliss  Ms. Wolford is requesting preoperative cardiac evaluation for left nephrectomy.  Procedure is scheduled for 07/14/2024.  She was recently seen in the clinic by you on 04/20/2024.  She was doing well from a cardiac standpoint at that time.  Her EKG showed sinus rhythm with sinus arrhythmia 65 bpm.  She denied palpitations.  Her blood pressure was well-controlled.  She was continued on rosuvastatin .  Follow-up was planned for 6 to 12 months.  Which should be acceptable risk for planned surgery?  Please direct your response to [facility] preop pool.  Josefa HERO. Meagan Ancona NP-C     07/10/2024, 2:14 PM [facility] [address] [place], [place] [address] Office [phone]

## 2024-07-13 ENCOUNTER — Telehealth: Payer: Self-pay

## 2024-07-13 ENCOUNTER — Telehealth (HOSPITAL_BASED_OUTPATIENT_CLINIC_OR_DEPARTMENT_OTHER): Payer: Self-pay | Admitting: *Deleted

## 2024-07-13 ENCOUNTER — Ambulatory Visit

## 2024-07-13 NOTE — Telephone Encounter (Signed)
" ° °  Patient Name: Kimberly Patton  DOB: [birth year] MRN: [id number]  Primary Cardiologist: Redell Cave, MD  Chart reviewed as part of pre-operative protocol coverage. Pre-op clearance already addressed by colleagues in earlier phone notes. To summarize recommendations:  -Per Dr. Cave [place] for procedure from a cardiac perspective. - Spoke with patient on the phone. She remains without chest pain, shortness of breath, palpitations. She does have chronic edema that is managed by nephrology. She was able to shovel snow last weekend without anginal symptoms.  Will route this bundled recommendation to requesting provider via Epic fax function and remove from pre-op pool. Please call with questions.  Mardy KATHEE Pizza, FNP 07/13/2024, 11:20 AM  "

## 2024-07-13 NOTE — Progress Notes (Signed)
 See phone note

## 2024-07-15 NOTE — Discharge Summary (Signed)
 ------------------------------------------------------------------------------- Attestation signed by Texie Oliva Barter, MD at 07/15/24 1136 I reviewed the case with the resident.  I agree with the assessment and plan as documented in the resident's note.   -------------------------------------------------------------------------------  [facility] Discharge Summary  Admit date: 07/14/2024  Discharge date and time: 07/15/24   Discharge to:  Home  Discharge Service: Urology ([facility])  Discharge Attending Physician: Oliva Barter Texie, MD  Discharge  Diagnoses: Right renal mass Renal mass  Secondary Diagnosis:  Principal Problem:   Renal mass  hyperlipidemia, asthma, and GERD  Past Medical History[1]   OR Procedures: Right - ROBOTIC SP LAPAROSCOPY,SURGICAL; PARTIAL NEPHRECTOMY 07/14/2024   Ancillary Procedures: no procedures  Condition at Discharge: good Patient is back to their functional baseline and is self-managing all activites of daily living.   Discharge Day Services: The patient was seen and examined by the Urology team both in the morning and immediately prior to discharge.  Vital signs and laboratory values were stable and within normal limits. The physical exam was benign and unchanged and all surgical wounds were examined. Discharge instructions were explained and all questions answered.  Subjective  No acute events overnight. Pain Controlled. No fever or chills.  Objective Patient Vitals for the past 8 hrs:  BP Temp Temp src Pulse Resp SpO2  07/15/24 0830 130/83 36.8 C (98.2 F) Oral 74 18 96 %  07/15/24 0342 120/79 36.5 C (97.7 F) Oral 73 17 96 %   I/O this shift: In: 300 [P.O.:300] Out: 150 [Urine:150]  General:  No acute distress Lungs:   Normal work of breathing Cardiac: Regular rate Abdomen: Appropriately tender, soft, non distended GU:  Voiding spontaneously Extremities: Warm and well perfused Neuro:             Alert and oriented, strength  and sensation grossly normal Wound: Clean, dry, and intact, without surrounding erythema or induration   Hospital Course:  HPI: Kimberly Patton is a 69 YO with a history of a right renal mass, admitted after planned procedure.  The patient underwent robot-assisted right partial nephrectomy on 07/14/2024.  The patient tolerated the procedure well, was extubated in the OR, and afterwards was taken to the PACU for routine post-surgical care. When stable the patient was transferred to the floor.  They did well postoperatively.  The patients diet was slowly advanced and at the time of discharge was tolerating a regular diet.  The patient was discharged home 1 Day Post-Op, at which point the patient was tolerating a regular solid diet, was able to void spontaneously, have adequate pain control with P.O. pain medication, and could ambulate without difficulty.   Pathology was not reviewed prior to hospital discharge.   Patient will follow up in 2-3 weeks with an urologic oncology APP.  Body mass index is 44.54 kg/m.  Wt Readings from Last 3 Encounters:  07/14/24 (!) 125.1 kg (275 lb 12.7 oz)  06/10/24 (!) 122.5 kg (270 lb)  06/01/24 (!) 124.9 kg (275 lb 4.8 oz)                       Was dialysis needed postop? no Was there a urine leak postop? no If yes, did patient go home with a JP? No If yes, did patient require a stent? no LIST COMPLICATIONS--- ANYTHING THAT DEVIATES FROM NORMAL POSTOP COURSE None  Condition at Discharge: Improved Discharge Medications:    Your Medication List     START taking these medications    acetaminophen  500  MG tablet Commonly known as: TYLENOL  EXTRA STRENGTH Take 2 tablets (1,000 mg total) by mouth every eight (8) hours for 7 days.   docusate sodium 100 MG capsule Commonly known as: COLACE Take 1 capsule (100 mg total) by mouth two (2) times a day for 15 doses. Take this medication while taking your narcotic   oxyCODONE  5 MG immediate release  tablet Commonly known as: ROXICODONE  Take 1 tablet (5 mg total) by mouth every four (4) hours as needed for pain.   senna 8.6 mg tablet Commonly known as: SENNA Take 1 tablet by mouth daily for 7 days.       CONTINUE taking these medications    aspirin  81 MG tablet Commonly known as: ECOTRIN Take 1 tablet (81 mg total) by mouth.   cholecalciferol (vitamin D3-1,250 mcg (50,000 unit)) 1,250 mcg (50,000 unit) capsule Take 1 capsule (1,250 mcg total) by mouth once a week.   coenzyme Q10-vitamin E  100-150 mg-unit Cap Take 100 mg by mouth daily.   cyanocobalamin  (vitamin B-12) 1,000 mcg Subl Place 1 tablet under the tongue.   dilTIAZem  30 MG tablet Commonly known as: CARDIZEM  Take 1 tablet (30 mg total) by mouth two (2) times a day as needed.   esomeprazole 20 MG capsule Commonly known as: NEXIUM Take 1 capsule (20 mg total) by mouth daily before breakfast.   fluticasone  propionate 50 mcg/actuation nasal spray Commonly known as: FLONASE  2 sprays into each nostril.   methocarbamol  500 MG tablet Commonly known as: ROBAXIN  SMARTSIG:1-2 Tablet(s) By Mouth Twice Daily PRN   nitroglycerin  0.4 MG SL tablet Commonly known as: NITROSTAT  Place 1 tablet (0.4 mg total) under the tongue every five (5) minutes as needed for chest pain.   rosuvastatin  10 MG tablet Commonly known as: CRESTOR  Take 1 tablet (10 mg total) by mouth daily. TAKE 1 TABLET (10 MG TOTAL) BY MOUTH DAILY.   spironolactone  25 MG tablet Commonly known as: ALDACTONE  Take 1 tablet (25 mg total) by mouth daily. TAKE 1 TABLET (25 MG TOTAL) BY MOUTH DAILY.   verapamil  120 MG CR tablet Commonly known as: CALAN -SR Take 1 tablet (120 mg total) by mouth two (2) times a day.        Pending Test Results:  Pending Labs     Order Current Status   Surgical pathology exam In process       Discharge Instructions: Activity Instructions     ASSIGN TO ENHANCED RECOVERY AFTER SURGERY PATHWAY          Follow  Up instructions and Outpatient Referrals    Discharge instructions      Other Instructions     Discharge instructions     POST OP DISCHARGE INSTRUCTIONS   Appointment:  If you have not already been scheduled, you should soon receive a call to schedule a one-month appointment for a post operative check. If you do not hear from us  over the next few business days, please use the Urology Clinic number below to call and schedule your appointment. You will be called regarding your pathology as soon as these results are available.  Results usually take 7 business days to come back.  Medications:  Tylenol : You have been prescribed extra-strength Tylenol  for pain control. We recommend that you take Tylenol  as scheduled. Do not exceed 4,000mg  of Tylenol  per day.  Oxycodone : You may also be prescribed narcotic pain pills to take only when your pain symptoms are not controlled with non-narcotic medications. These may cause drowsiness, nausea, or  confusion. Please stop taking if you experience this symptoms. Do not drive while taking these medications.  Bowel Regimen: Constipation is a known side effect of narcotic medications. You may receive a prescription for stool softeners to avoid straining after surgery. It may take a few days to have your first bowel movement after surgery. You may use over the counter MiraLAX, milk of magnesia, Colace, senna, Dulcolax. You may also use over the count suppositories (such as Dulcolax) or enemas (such as Fleets). Stop taking these medications if you develop diarrhea.   Diet: You may resume eating and drinking whatever you wish. Appetite may be diminished in the first several days at home. Drink plenty of water and avoid heavy meals. Eat plenty of vegetables and fruit to avoid constipation. Please avoid alcohol for 1 week after surgery and while taking narcotic medications.  Activity and Bathing:  No heavy lifting (more than 10 pounds or the weight of a gallon of milk)  and vigorous exercise for at least 4 weeks. After that, you can gradually build up to your pre-surgical level of activity.   Be sure to continue taking frequent walks during the day. This helps to prevents blood clots in the legs, which can travel to the lungs and become life threatening. You may go up and down stairs. You may take walks outsides.  You may shower. If you have surgical wounds, do not immerse wounds in water for 2 weeks.  Wash the surgical site with mild soap and water, but do not scrub vigorously. Surgical glue will peel off on its own in 1 to 2 weeks. You may drive a car 3-4 days after surgery if not taking narcotic pain medication.   Common Post Operative Concerns:  - Pain: Abdominal pain is common but is often not located where you would expect it. Rather it can be on either side or the other midline (it rarely hurts equally on both sides). The pain is from irritation of the abdominal muscles. You may notice sensitivity when you fasten your pants belt or a seat belt. This is normal.  - Nausea: Some people have an upset stomach after surgery. This can be because of anesthesia, pain, pain medicine, or the stress of surgery. If you are experiencing some nausea, these tips may help:  a) Eat liquid or soft foods, such as soups, mashed potatoes, applesauce.  b) Slowly move to more solid foods. Don't eat fatty, rich, or spicy foods at first.  c) Don't force yourself to have 3 large meals a day. Instead, eat smaller amounts more often.  d) Take pain medicines with a small amount of solid food, such as crackers or toast, to prevent nausea. If you are vomiting or nausea is not resolving and you are unable to drink or eat, please call your doctor.  - Wound: You may notice firm areas of lumps in the incision. This is part of the normal healing process. If you notice a hard area or lump at the top of the incision, this is where the suture material was tied and is also normal. It will resolve with  time. Some patients develop drainage from the wound once they go home. This can either be clear fluid or a mixture of blood and clear fluid. This may be normal. However, you should call if you notice:  a) the wound looks larger,  b) the edges of the wound look like they are separating,  c) rapidly spreading redness and/or swelling around the wound, or  d) purulent discharge from the wound.    Tubes and Drains: Not applicable.   WARNING SIGNS: When to Call   Red Light: Act NOW. You should be seen right away. Call 911 or go to nearest hospital. - Bleeding that will not stop.  - Hard to breathe.  - New seizure.  - Chest pain.  - Fall or passing out.  - Thoughts of hurting yourself or others.   Yellow Light: Act TODAY. Call our office. You may need to be seen that day. Our Nurse Triage serve will assist you. If you have not heard back from the Nurse in a timely manner, proceed to the hospital.  - Pain that is new or worsening.  - Fever over 101F - Nausea and/or vomiting. Unable to eat or drink.   - Shortness of breath.  - Worsening cough, with mucus that is green, yellow, or bloody.  - No urine for 12 hours.  - Swelling or pain in one leg.  - Confusion.  - Vision changes. - Depression or anxiety.  - Catheter or tube issues (e.g., tube falls out, tube is clogged).   - Wound: re-opened, rapidly spreading redness and/or swelling, increase wound size, purulent or pus-like discharge from wound   Green Light: No need to act. Your symptoms are under control. Keep all visits as scheduled.  - No increase or worsening symptoms.  - Able to take your medicine and eat/drink.    Contact Information: If you need assistance weekdays between 8:00AM - 4:00PM, please call the Urology clinic at [phone]. If you have a nighttime or weekend EMERGENCY, please call [phone] and ask to speak to the Urology resident on call. Note, we are handling all incoming hospital and outside calls, so please be  patient and we will be in touch with you as soon as possible.                [1] No past medical history on file.

## 2024-07-16 ENCOUNTER — Telehealth: Payer: Self-pay

## 2024-07-16 ENCOUNTER — Ambulatory Visit

## 2024-07-16 NOTE — Telephone Encounter (Signed)
 Noted.

## 2024-07-16 NOTE — Patient Instructions (Signed)
 Visit Information  Thank you for taking time to visit with me today. Please don't hesitate to contact me if I can be of assistance to you before our next scheduled telephone appointment.  Our next appointment is by telephone on 07/23/24 at 11am  Following is a copy of your care plan:   Goals Addressed             This Visit's Progress    VBCI Transitions of Care (TOC) Care Plan       Problems:  Recent Hospitalization for treatment of status post robotic laparoscopic partial nephrectomy Knowledge Deficit Related to status post robotic laparoscopic partial nephrectomy and No Hospital Follow Up Provider appointment offered to assist patient with scheduling a hospital follow up visit with her primary care provider. Patient states will follow up with her primary provider to determine if an appointment is needed.   Goal:  Over the next 30 days, the patient will not experience hospital readmission  Interventions:  Transitions of Care: Doctor Visits  - discussed the importance of doctor visits Communication with primary care provider re: enrollment into the 30 day TOC program.  Post discharge activity limitations prescribed by provider reviewed Post-op wound/incision care reviewed with patient/caregiver Reviewed Signs and symptoms of infection Reviewed patients discharge instructions: discussed current diet, activity and bathing, pain, nausea, wound Advised to take medications as prescribed.  Advised to report infection like symptoms, pain that does not lessen with current treatment plan to surgeon Confirmed patient has a follow up visit scheduled with the surgeon _ 07/28/24.  Advised to keep incision sites clean and dry Advised to take frequent walks during the day Advised to seek emergency medical services for severe shortness of breath or chest pain Assessed for nause/ vomiting/ diarrhea/ constipation Advised to avoid lifting more than 5-10 lbs, vigorous exercise.  Patient Self Care  Activities:  Attend all scheduled provider appointments Call pharmacy for medication refills 3-7 days in advance of running out of medications Call provider office for new concerns or questions  Notify RN Care Manager of TOC call rescheduling needs Participate in Transition of Care Program/Attend TOC scheduled calls Take medications as prescribed   take medications as prescribed.  eport infection like symptoms, pain that does not lessen with current treatment plan to surgeon keep incision sites clean and dry  take frequent walks during the day seek emergency medical services for severe shortness of breath or chest pain  avoid lifting more than 5-10 lbs, vigorous exercise.  Plan:  Telephone follow up appointment with care management team member scheduled for:  07/23/24 at 11 am The patient has been provided with contact information for the care management team and has been advised to call with any health related questions or concerns.         Patient verbalizes understanding of instructions and care plan provided today and agrees to view in MyChart. Active MyChart status and patient understanding of how to access instructions and care plan via MyChart confirmed with patient.     The patient has been provided with contact information for the care management team and has been advised to call with any health related questions or concerns.   Please call the care guide team at [phone] if you need to cancel or reschedule your appointment.   Please call the Suicide and Crisis Lifeline: 988 call the [place]  National Suicide Prevention Lifeline: (810)812-1915 or TTY: 216-165-7138 TTY 760 117 9656) to talk to a trained counselor call 1-800-273-TALK (toll free, 24 hour hotline) if  you are experiencing a Mental Health or Behavioral Health Crisis or need someone to talk to.  Arvin Seip RN, BSN, CCM [employer], Population Health Case Manager Phone:  [phone]

## 2024-07-16 NOTE — Transitions of Care (Post Inpatient/ED Visit) (Signed)
 "  07/16/2024  Name: Kimberly Patton MRN: [id number] DOB: December 09, 1955  Today's TOC FU Call Status: Today's TOC FU Call Status:: Successful TOC FU Call Completed TOC FU Call Complete Date: 07/16/24  Patient's Name and Date of Birth confirmed. Name, DOB  Transition Care Management Follow-up Telephone Call Date of Discharge: 07/15/24 Discharge Facility: Other ([facility] Facility) Name of Other (Non[facility]) Discharge Facility: [facility] medical care Type of Discharge: Inpatient Admission Primary Inpatient Discharge Diagnosis:: right renal mass How have you been since you were released from the hospital?: Worse Any questions or concerns?: Yes Patient Questions/Concerns:: patient states the nurse in the hospital put a pad on her back to help with comfort during surgery. She states she took a shower today and the pad became very wet. Patient states the pad is a dressing and is not covering any of her incision sites. patient wants to know if she should take the PAD off.  Advised ok to take pad off however advised patient to call the surgeon's office today to confirm with them.  Patient verbalized understanding and agreement. Patient Questions/Concerns Addressed: Other: (advised to call surgeon's office.)  Items Reviewed: Did you receive and understand the discharge instructions provided?: Yes Medications obtained,verified, and reconciled?: Yes (Medications Reviewed) Any new allergies since your discharge?: No Dietary orders reviewed?: Yes Type of Diet Ordered:: Per patients discharge instructions:  You may resume eating and drinking whatever you wish. Appetite may be diminished in the first several days at home. Drink plenty of water and avoid heavy meals. Eat plenty of vegetables and fruit to avoid constipation. Please avoid alcohol for 1 week after surgery and while taking narcotic medications. Do you have support at home?: Yes People in Home [RPT]: child(ren), adult Name of Support/Comfort Primary  Source: [place]  Medications Reviewed Today: Medications Reviewed Today     Reviewed by Andon Villard E, RN (Registered Nurse) on 07/16/24 at 1453  Med List Status: <None>   Medication Order Taking? Sig Documenting Provider Last Dose Status Informant  acetaminophen  (TYLENOL ) 500 MG tablet [id number] Yes Take 500 mg by mouth every 8 (eight) hours as needed. Per medication list on [facility] discharge summary:  Take 2 tablets (1,000mg ) by mouth every 8 hours for 7 days [provider]  Active   albuterol  (PROVENTIL ) (2.5 MG/3ML) 0.083% nebulizer solution [id number] Yes Take 3 mLs (2.5 mg total) by nebulization every 6 (six) hours as needed for wheezing or shortness of breath. Avelina Greig BRAVO, MD  Active   albuterol  (PROVENTIL ) (2.5 MG/3ML) 0.083% nebulizer solution 2.5 mg [id number]   Tamea Dedra CROME, MD  Active   amoxicillin -clavulanate (AUGMENTIN ) 875-125 MG tablet [id number]  Take 1 tablet by mouth 2 (two) times daily.  Patient not taking: Reported on 07/16/2024   [provider]  Active   aspirin  EC 81 MG tablet [id number] Yes Take 81 mg by mouth daily. Swallow whole. [provider]  Active   Calcium  Citrate-Vitamin D  (CITRACAL + D PO) [id number] Yes Take 2 tablets by mouth daily.  [provider]  Active Self  Cholecalciferol (VITAMIN D3) 1.25 MG (50000 UT) CAPS [id number] Yes TAKE 1 CAPSULE BY MOUTH ONE TIME PER WEEK Bedsole, Amy E, MD  Active   Coenzyme Q10-Vitamin E  (QUNOL ULTRA COQ10) 100-150 MG-UNIT CAPS [id number] Yes Take 100 mg by mouth daily. [provider]  Active Self  Cyanocobalamin  (VITAMIN B-12) 2500 MCG SUBL [id number] Yes Place 2,500 mcg under the tongue daily. [provider]  Active Self  diltiazem  (CARDIZEM )  30 MG tablet [id number] Yes Take 30 mg by mouth 2 (two) times daily as needed. [provider]  Active Self  docusate sodium (COLACE) 100 MG capsule [id number] Yes Take 100 mg by mouth 2 (two) times daily. Per  medication list on [facility] discharge summary: take 1 capsule ( 100 mg) by mouth 2 times a day for 15 doses. Take this medication while taking your narcotics. [provider]  Active   esomeprazole (NEXIUM) 20 MG capsule [id number] Yes Take 20 mg by mouth daily at 12 noon. [provider]  Active   furosemide  (LASIX ) 20 MG tablet [id number]  Take 1 tablet (20 mg total) by mouth daily as needed.  Patient not taking: Reported on 07/13/2024   Darliss Rogue, MD  Expired 07/08/24 2359   guaiFENesin  (MUCINEX ) 600 MG 12 hr tablet [id number]  Take by mouth daily.  Patient not taking: Reported on 07/13/2024   [provider]  Active   Lactobacillus Rhamnosus, GG, (CULTURELLE IMMUNITY SUPPORT PO) [id number]  Take 1 capsule by mouth daily.  Patient not taking: Reported on 07/16/2024   [provider]  Active Self  levalbuterol  (XOPENEX  HFA) 45 MCG/ACT inhaler [id number] Yes Inhale 2 puffs into the lungs every 6 (six) hours as needed for wheezing. Tamea Dedra CROME, MD  Active   Menaquinone-7 (VITAMIN K2  PO) [id number]  Take 100 mg by mouth daily. [provider]  Active Self  Multiple Vitamins-Minerals (CENTRUM SILVER 50+WOMEN PO) [id number] Yes Take 1 tablet by mouth daily. [provider]  Active Self  nitroGLYCERIN  (NITROSTAT ) 0.4 MG SL tablet [id number] Yes Place 1 tablet (0.4 mg total) under the tongue every 5 (five) minutes as needed for chest pain. Darliss Rogue, MD  Active Self  oxyCODONE  (OXY IR/ROXICODONE ) 5 MG immediate release tablet [id number] Yes Take 5 mg by mouth every 4 (four) hours as needed for severe pain (pain score 7-10). Per medication list on [facility] discharge summary: take 1 tablet (5 mg total) by mouth every 4 hours as needed for pain [provider]  Active   Riboflavin  400 MG CAPS [id number] Yes Take 400 mg by mouth daily. [provider]  Active Self  rosuvastatin  (CRESTOR ) 10 MG tablet [id number] Yes TAKE 1 TABLET BY MOUTH  EVERY DAY Agbor-Etang, Rogue, MD  Active   senna (SENOKOT) 8.6 MG TABS tablet [id number] Yes Take 1 tablet by mouth daily. Per medication list on [facility] discharge summary: take 1 tablet by mouth daily for 7 days [provider]  Active   spironolactone  (ALDACTONE ) 25 MG tablet [id number] Yes TAKE 1 TABLET (25 MG TOTAL) BY MOUTH DAILY. Darliss Rogue, MD  Active   traMADol  (ULTRAM ) 50 MG tablet [id number]  Take 1 tablet (50 mg total) by mouth every 6 (six) hours as needed.  Patient not taking: Reported on 07/16/2024   Eletha Boas, MD  Active Self  TURMERIC PO [id number] Yes Take 2 tablets by mouth daily. 500 mg  With Ginger 50 mg Gummies [provider]  Active Self  verapamil  (CALAN -SR) 120 MG CR tablet [id number] Yes TAKE 1 TABLET BY MOUTH 2 TIMES DAILY. Cindie Ole DASEN, MD  Active             Home Care and Equipment/Supplies: Were Home Health Services Ordered?: No Any new equipment or medical supplies ordered?: No  Functional Questionnaire: Do you need assistance with bathing/showering or dressing?: No Do you need assistance with meal preparation?: No Do you need assistance with eating?: No Do you have  difficulty maintaining continence: No Do you need assistance with getting out of bed/getting out of a chair/moving?: No Do you have difficulty managing or taking your medications?: No  Follow up appointments reviewed: PCP Follow-up appointment confirmed?: No (patient states she will follow up with her primary care provider  regarding a follow up appointment.) Specialist Hospital Follow-up appointment confirmed?: Yes Date of Specialist follow-up appointment?: 07/28/24 Follow-Up Specialty Provider:: Dr. Oliva Bjurlin Do you need transportation to your follow-up appointment?: No Do you understand care options if your condition(s) worsen?: Yes-patient verbalized understanding  SDOH Interventions Today    Flowsheet Row Most Recent Value  SDOH Interventions   Food  Insecurity Interventions Intervention Not Indicated  Housing Interventions Intervention Not Indicated  Transportation Interventions Intervention Not Indicated  Utilities Interventions Intervention Not Indicated   Discussed and offered 30 day TOC program.  Patient verbally agreed.   The patient has been provided with contact information for the care management team and has been advised to call with any health -related questions or concerns.  The patient verbalized understanding with current plan of care.  The patient is directed to their insurance card regarding availability of benefits coverage.    Arvin Seip RN, BSN, CCM [employer], Population Health Case Manager Phone: [phone]  "

## 2024-08-12 ENCOUNTER — Ambulatory Visit

## 2024-08-27 ENCOUNTER — Inpatient Hospital Stay

## 2024-11-18 ENCOUNTER — Ambulatory Visit: Admitting: Urology

## 2024-11-30 ENCOUNTER — Inpatient Hospital Stay

## 2024-11-30 ENCOUNTER — Inpatient Hospital Stay: Admitting: Oncology

## 2024-12-04 ENCOUNTER — Inpatient Hospital Stay

## 2024-12-04 ENCOUNTER — Inpatient Hospital Stay: Admitting: Oncology
